# Patient Record
Sex: Male | Born: 1955 | Race: White | Hispanic: No | Marital: Single | Smoking: Current every day smoker
Health system: Southern US, Community
[De-identification: ages and names within clinical notes are randomized; demographics above are authoritative.]

## PROBLEM LIST (undated history)

## (undated) ENCOUNTER — Emergency Department (HOSPITAL_COMMUNITY): Discharge status: Absent without leave | Payer: Medicare Other

## (undated) DIAGNOSIS — G4700 Insomnia, unspecified: Secondary | ICD-10-CM

## (undated) DIAGNOSIS — J45909 Unspecified asthma, uncomplicated: Secondary | ICD-10-CM

## (undated) DIAGNOSIS — K259 Gastric ulcer, unspecified as acute or chronic, without hemorrhage or perforation: Secondary | ICD-10-CM

## (undated) DIAGNOSIS — F32A Depression, unspecified: Secondary | ICD-10-CM

## (undated) DIAGNOSIS — F200 Paranoid schizophrenia: Secondary | ICD-10-CM

## (undated) DIAGNOSIS — E039 Hypothyroidism, unspecified: Secondary | ICD-10-CM

## (undated) DIAGNOSIS — F329 Major depressive disorder, single episode, unspecified: Secondary | ICD-10-CM

## (undated) DIAGNOSIS — J449 Chronic obstructive pulmonary disease, unspecified: Secondary | ICD-10-CM

## (undated) DIAGNOSIS — N1330 Unspecified hydronephrosis: Secondary | ICD-10-CM

## (undated) DIAGNOSIS — R569 Unspecified convulsions: Secondary | ICD-10-CM

## (undated) DIAGNOSIS — F99 Mental disorder, not otherwise specified: Secondary | ICD-10-CM

## (undated) DIAGNOSIS — K219 Gastro-esophageal reflux disease without esophagitis: Secondary | ICD-10-CM

## (undated) DIAGNOSIS — K644 Residual hemorrhoidal skin tags: Secondary | ICD-10-CM

## (undated) DIAGNOSIS — I219 Acute myocardial infarction, unspecified: Secondary | ICD-10-CM

## (undated) DIAGNOSIS — I639 Cerebral infarction, unspecified: Secondary | ICD-10-CM

## (undated) DIAGNOSIS — N289 Disorder of kidney and ureter, unspecified: Secondary | ICD-10-CM

## (undated) HISTORY — PX: NEPHRECTOMY: SHX65

## (undated) HISTORY — PX: SUPRAPUBIC CATHETER INSERTION: SUR719

## (undated) HISTORY — PX: APPENDECTOMY: SHX54

---

## 2004-06-20 ENCOUNTER — Ambulatory Visit: Payer: Self-pay | Admitting: Urology

## 2005-06-03 ENCOUNTER — Other Ambulatory Visit: Payer: Self-pay

## 2005-06-03 ENCOUNTER — Emergency Department: Payer: Self-pay | Admitting: Unknown Physician Specialty

## 2005-06-25 ENCOUNTER — Inpatient Hospital Stay: Payer: Self-pay | Admitting: Urology

## 2005-06-25 ENCOUNTER — Other Ambulatory Visit: Payer: Self-pay

## 2005-07-22 ENCOUNTER — Ambulatory Visit: Payer: Self-pay | Admitting: Urology

## 2005-07-30 ENCOUNTER — Ambulatory Visit: Payer: Self-pay | Admitting: Urology

## 2005-08-12 ENCOUNTER — Ambulatory Visit: Payer: Self-pay | Admitting: Urology

## 2005-09-15 ENCOUNTER — Ambulatory Visit: Payer: Self-pay | Admitting: Urology

## 2005-10-01 ENCOUNTER — Ambulatory Visit: Payer: Self-pay | Admitting: Urology

## 2005-10-08 ENCOUNTER — Ambulatory Visit: Payer: Self-pay | Admitting: Urology

## 2005-10-15 ENCOUNTER — Ambulatory Visit: Payer: Self-pay | Admitting: Urology

## 2005-10-23 ENCOUNTER — Ambulatory Visit: Payer: Self-pay | Admitting: Urology

## 2005-11-04 ENCOUNTER — Ambulatory Visit: Payer: Self-pay | Admitting: Urology

## 2005-12-16 ENCOUNTER — Ambulatory Visit: Payer: Self-pay | Admitting: Urology

## 2006-01-09 ENCOUNTER — Ambulatory Visit: Payer: Self-pay | Admitting: Urology

## 2006-03-20 ENCOUNTER — Ambulatory Visit: Payer: Self-pay | Admitting: Urology

## 2006-03-26 ENCOUNTER — Other Ambulatory Visit: Payer: Self-pay

## 2006-03-26 ENCOUNTER — Ambulatory Visit: Payer: Self-pay | Admitting: Urology

## 2006-04-09 ENCOUNTER — Ambulatory Visit: Payer: Self-pay | Admitting: Urology

## 2006-04-09 ENCOUNTER — Other Ambulatory Visit: Payer: Self-pay

## 2006-04-17 ENCOUNTER — Ambulatory Visit: Payer: Self-pay | Admitting: Urology

## 2006-04-30 ENCOUNTER — Ambulatory Visit: Payer: Self-pay | Admitting: Urology

## 2006-06-09 ENCOUNTER — Ambulatory Visit: Payer: Self-pay | Admitting: Urology

## 2006-07-28 ENCOUNTER — Ambulatory Visit: Payer: Self-pay | Admitting: Urology

## 2006-09-11 ENCOUNTER — Ambulatory Visit: Payer: Self-pay | Admitting: Urology

## 2006-10-29 ENCOUNTER — Ambulatory Visit: Payer: Self-pay | Admitting: Urology

## 2007-01-28 ENCOUNTER — Ambulatory Visit: Payer: Self-pay | Admitting: Urology

## 2007-02-10 ENCOUNTER — Ambulatory Visit: Payer: Self-pay | Admitting: Urology

## 2007-03-04 ENCOUNTER — Ambulatory Visit: Payer: Self-pay | Admitting: Urology

## 2007-03-15 ENCOUNTER — Ambulatory Visit: Payer: Self-pay | Admitting: Urology

## 2007-04-12 ENCOUNTER — Ambulatory Visit: Payer: Self-pay | Admitting: Urology

## 2007-07-08 ENCOUNTER — Ambulatory Visit: Payer: Self-pay | Admitting: Urology

## 2007-11-18 ENCOUNTER — Ambulatory Visit: Payer: Self-pay | Admitting: Urology

## 2007-11-25 ENCOUNTER — Ambulatory Visit: Payer: Self-pay | Admitting: Urology

## 2007-12-10 ENCOUNTER — Ambulatory Visit: Payer: Self-pay | Admitting: Urology

## 2007-12-27 IMAGING — CR DG CHEST 1V PORT
1 series · 1 of 1 positions shown · non-contrast
Comparison: none

REASON FOR EXAM: Shortness of breath
COMMENTS:

PROCEDURE:     DXR - DXR PORTABLE CHEST SINGLE VIEW  - June 03, 2005  [DATE]
RESULT:     AP view of the chest shows the lung fields to be clear. No acute
changes of the heart, mediastinal or osseous structures are seen. Monitoring
electrodes are present.

[view not recorded]
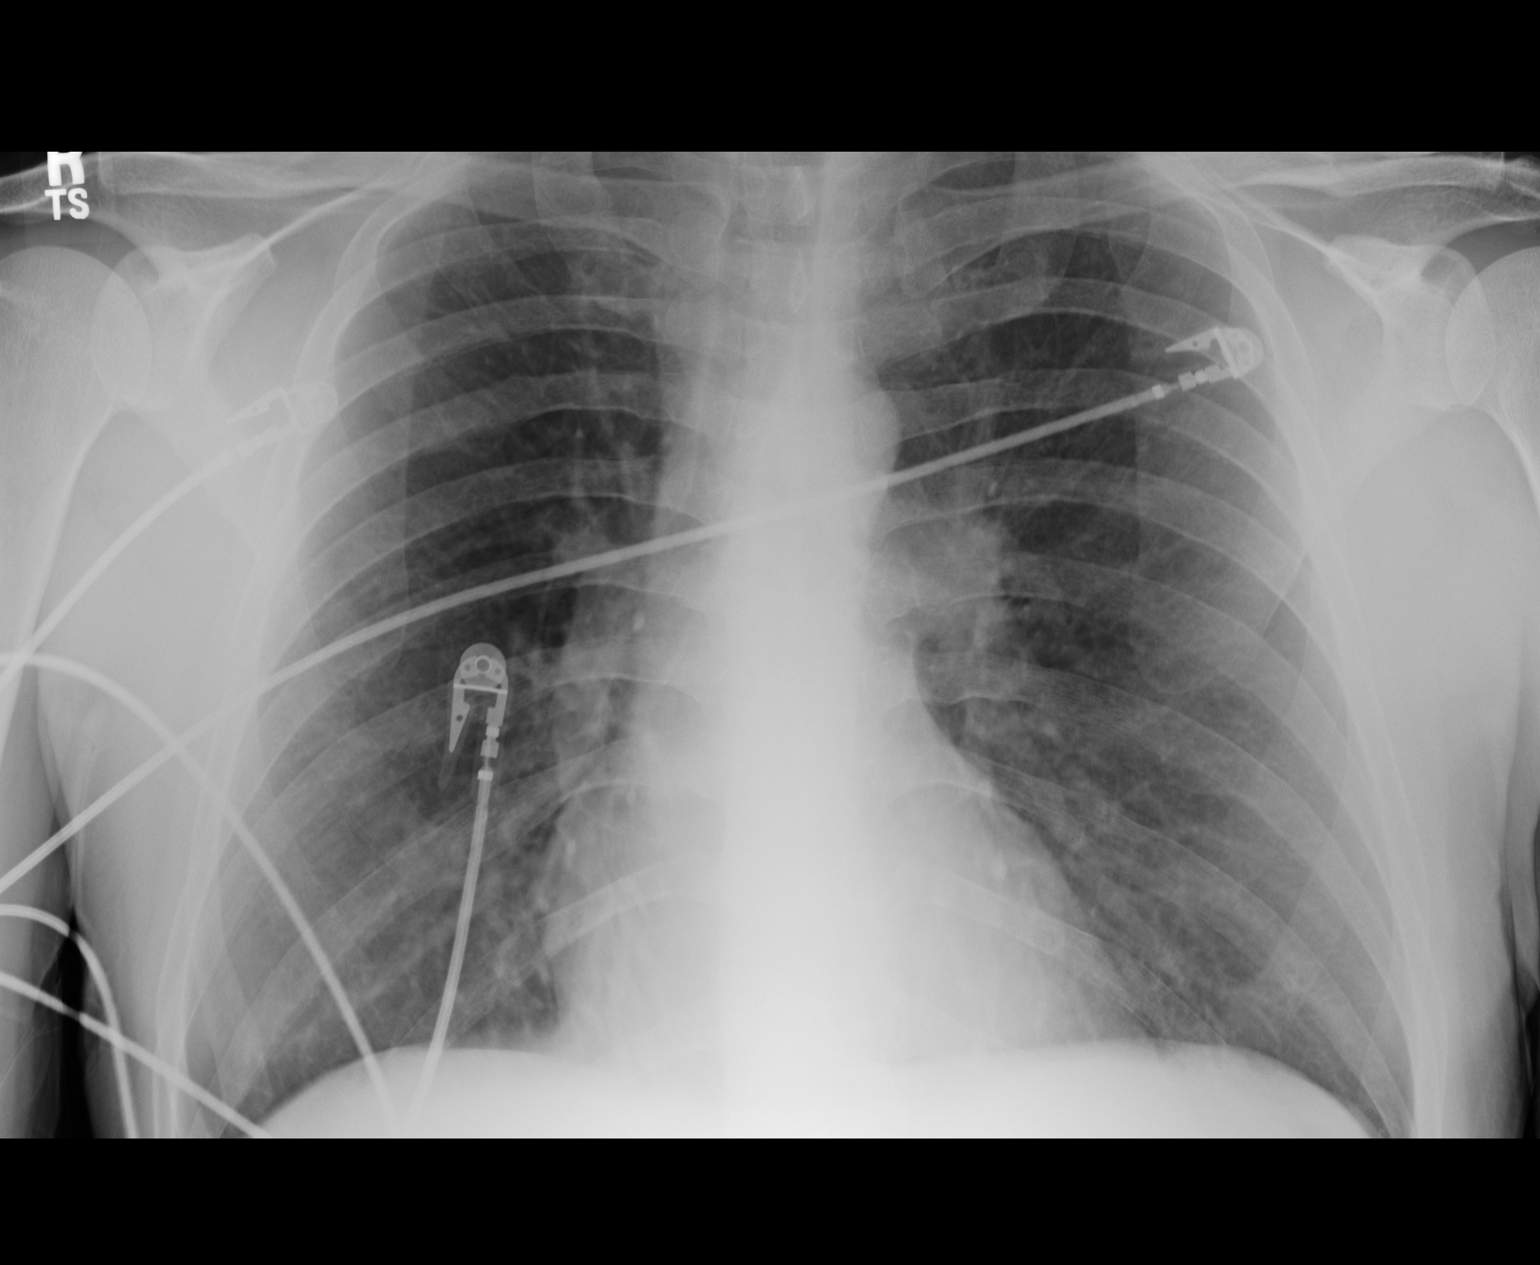

[1 of 1 positions shown; findings below may reference images not displayed]

IMPRESSION: No acute changes are identified.

## 2008-01-13 ENCOUNTER — Ambulatory Visit: Payer: Self-pay | Admitting: Urology

## 2008-01-18 IMAGING — CT CT ABD-PELV W/O CM
1 of 3 series · 13 of 32 positions shown, 19 images · non-contrast
Comparison: none

REASON FOR EXAM: (1) Abdominal pain/dark urine rm 7 (2) Abdominal pain
/dark urine rm 7
COMMENTS:

[Series 2: stone · axial · 0.79mm/px · z∈[-39,+345]mm · 13 of 148 slices shown, 19 images]
[im 10/148  soft-tissue]
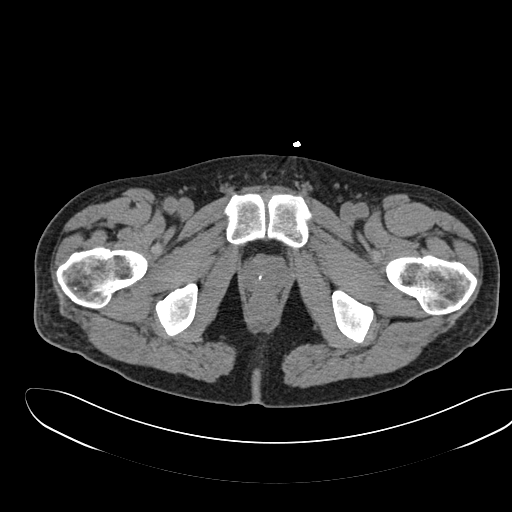
[im 10/148  bone]
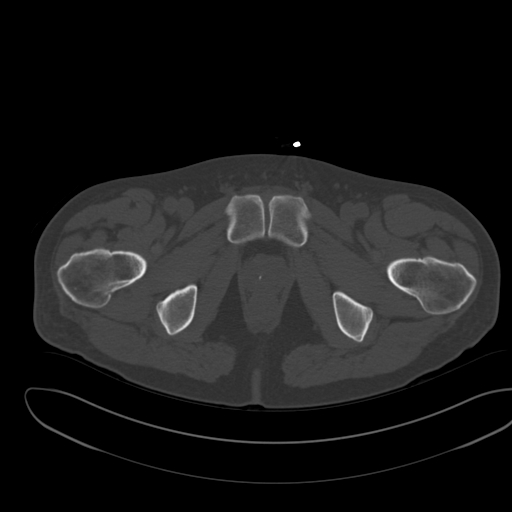
[im 20/148  soft-tissue]
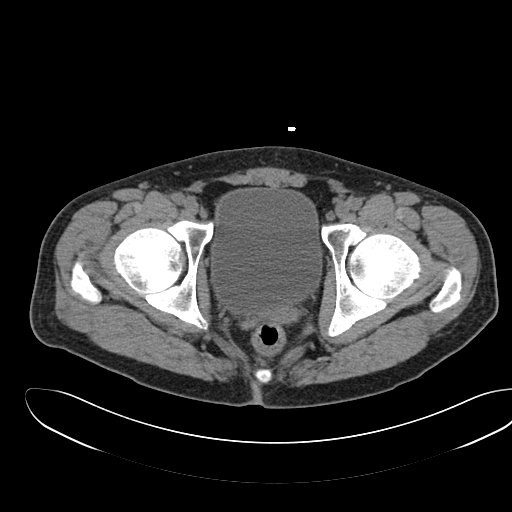
[im 30/148  soft-tissue]
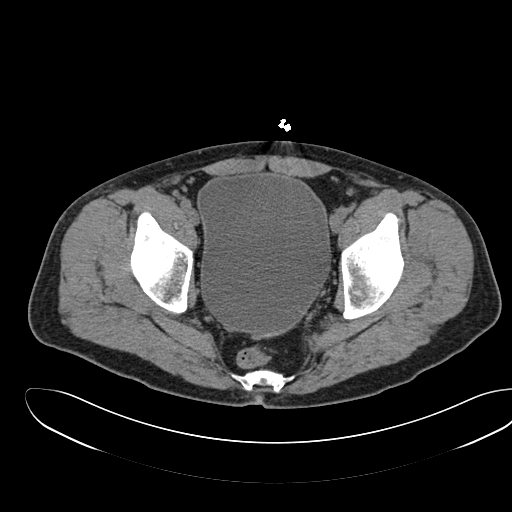
[im 40/148  soft-tissue]
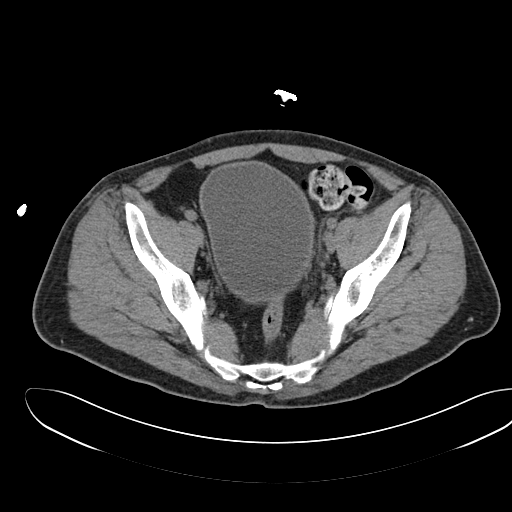
[im 50/148  soft-tissue]
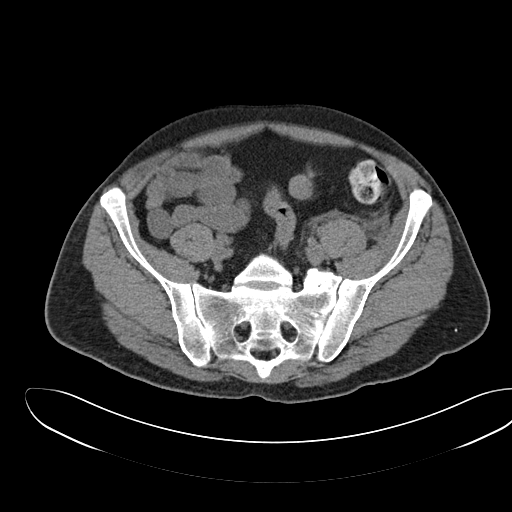
[im 59/148  soft-tissue]
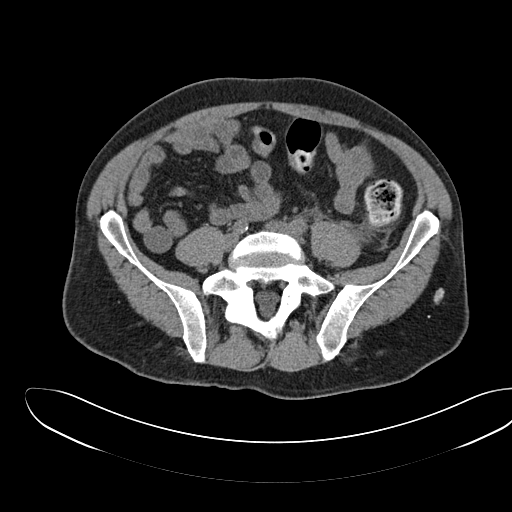
[im 79/148  soft-tissue]
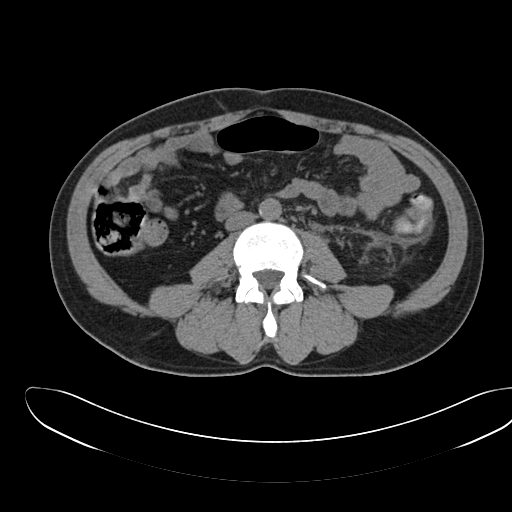
[im 89/148  soft-tissue]
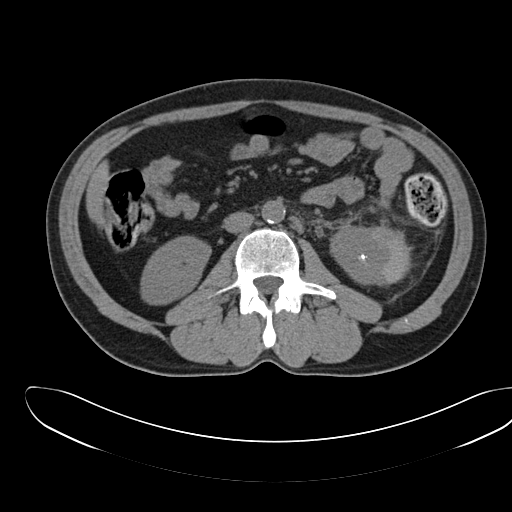
[im 99/148  soft-tissue]
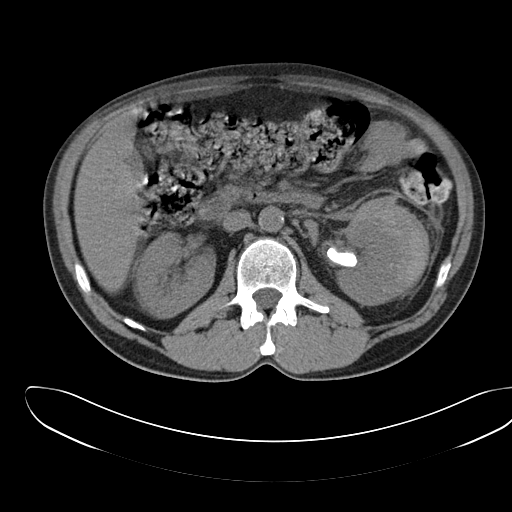
[im 99/148  bone]
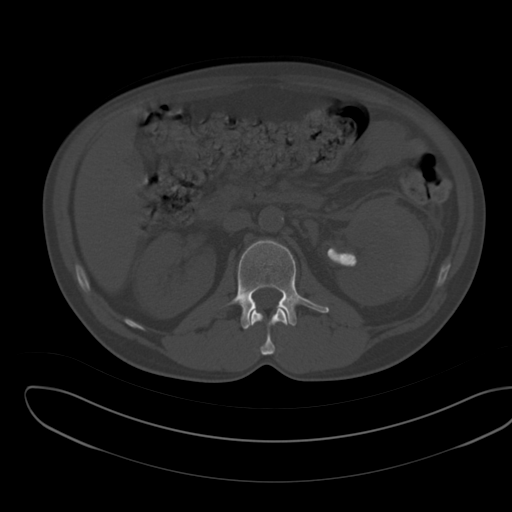
[im 108/148  soft-tissue]
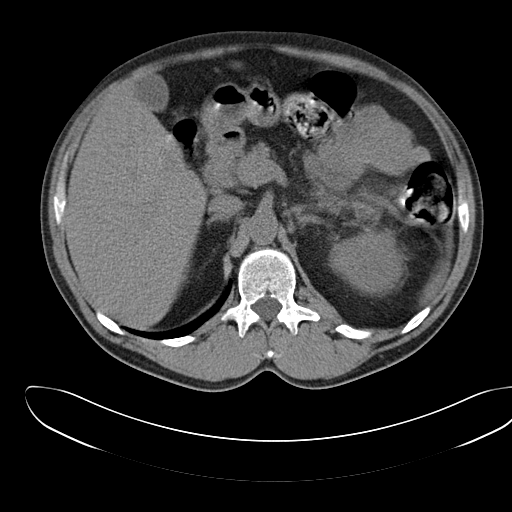
[im 108/148  lung]
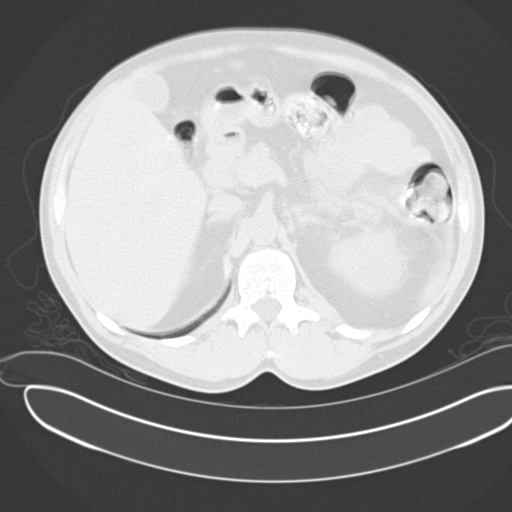
[im 118/148  soft-tissue]
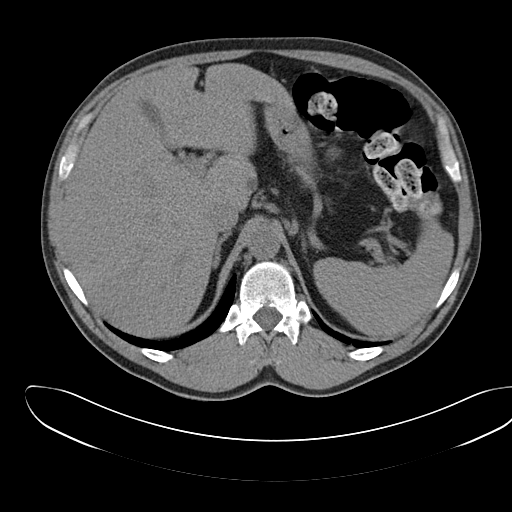
[im 118/148  lung]
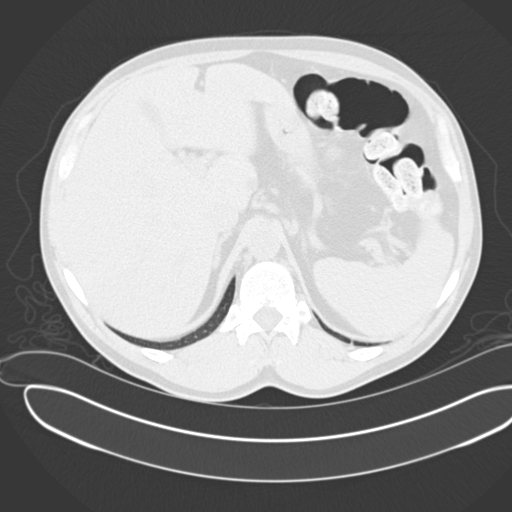
[im 128/148  soft-tissue]
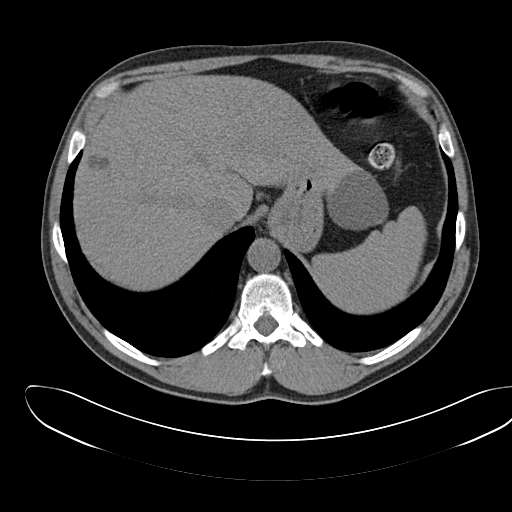
[im 128/148  lung]
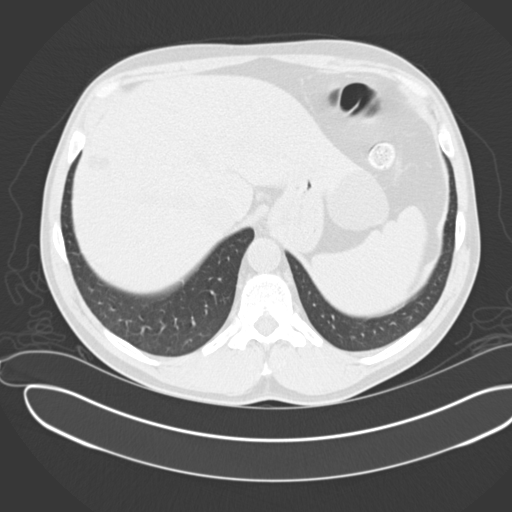
[im 138/148  soft-tissue]
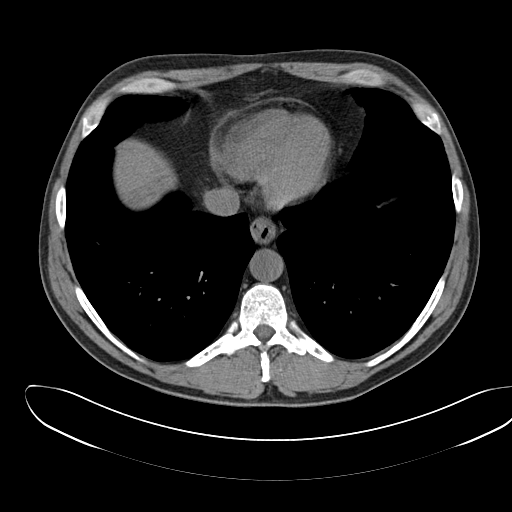
[im 138/148  lung]
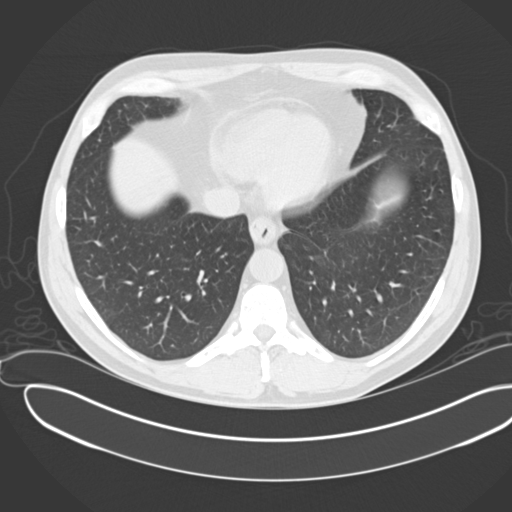

[13 of 32 positions shown; findings below may reference images not displayed]

PROCEDURE:     CT  - CT ABDOMEN AND PELVIS W[DATE] [DATE]

RESULT:       Emergent unenhanced CT scan was performed for abdominal pain
and hematuria.  Exam was originally read by the [HOSPITAL].

There is a large calculus measuring 2.5 cm in the renal pelvis as well as
smaller calculi measuring approximately 6 mm in the lower pole, other
measures 9 mm and 2 mm.  There is noted a subcapsular hematoma measuring
approximately 10.5 cm thick and there appear calculi measuring approximately
8 mm in the posterior LEFT kidney as well as small in the subcutaneous
tissue and retroperitoneal fat.  Has the patient had recent procedure on the
LEFT kidney?  There is thickening of Gerota's fascia with some fluid
extending down into the pelvis.  There are noted two calculi in the lower
pole of the RIGHT kidney.  One approximately 2 mm and one approximately 6
mm.  No ureteral calculi are noted.  No hydronephrosis is noted.  The lung
bases appear clear.
IMPRESSION: 1.     Subcapsular hematoma in the LEFT kidney with some fluid in Gerota's
fascia extending down to the LEFT pelvis.  There is noted a staghorn
calculus in the LEFT kidney.  Largest in the renal pelvis.  There appears
several more calculi in the lower pole as well as in the retroperitoneal fat
and the subcutaneous tissue.  It appears that the patient has had recent
procedure on the LEFT kidney.
2.     Two calculi are noted in the lower pole of the RIGHT kidney which is
nonobstructive.

## 2008-01-19 IMAGING — CR DG ABDOMEN 1V
1 series · 1 of 1 positions shown · non-contrast
Comparison: none

REASON FOR EXAM: PYELONEPHRITIS
COMMENTS:

[view not recorded]
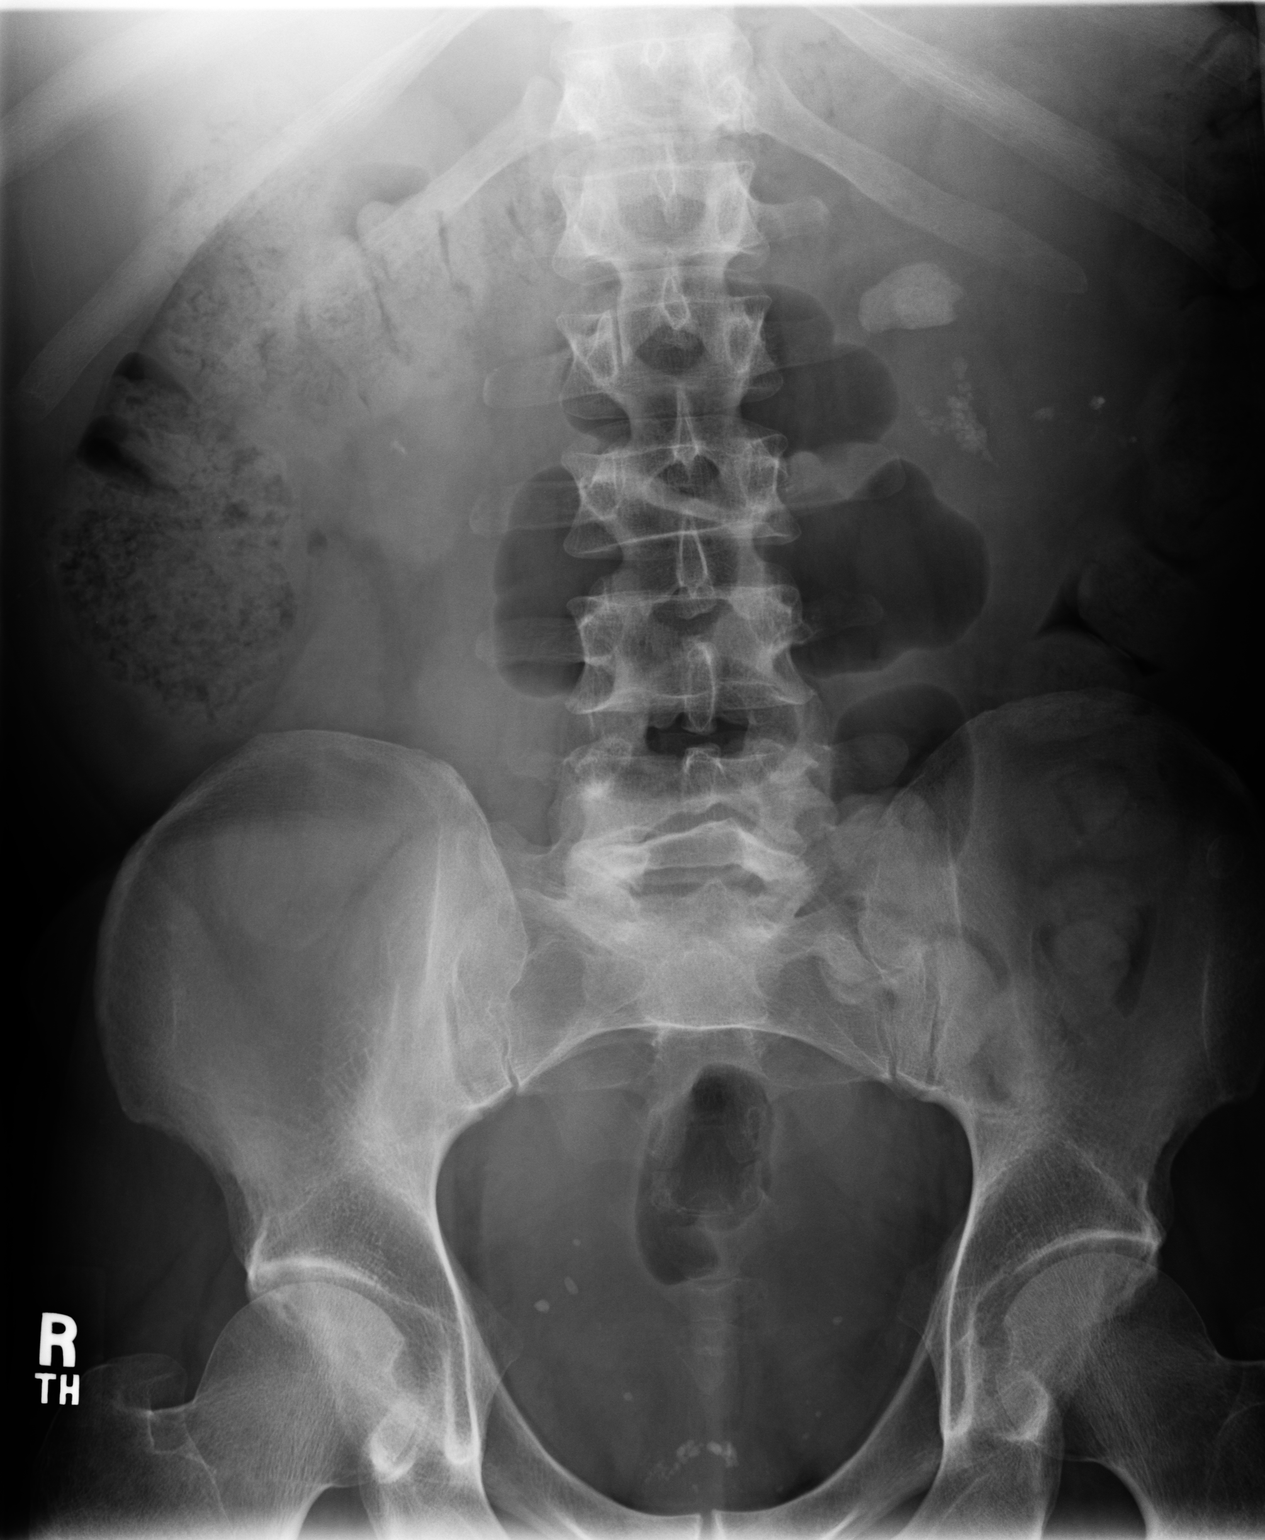

[1 of 1 positions shown; findings below may reference images not displayed]

PROCEDURE:     DXR - DXR KIDNEY URETER BLADDER  - June 26, 2005  [DATE]

RESULT:     There is a 2.8 cm x 1.8 cm calcification projected over the
midpole region of the LEFT kidney.  Multiple smaller calcifications are
present more inferiorly.  The RIGHT kidney is in great part obscured by
overlying bowel and bowel content.  There is a single tiny 2 to 3 mm density
projected over the lower pole of the RIGHT kidney which could represent a
tiny RIGHT caliceal stone or could be external to the kidney.  Calcification
is present in the region of the prostate gland.  There are a few small
calcifications observed in the pelvis.  The nature of these on plain film
exam is indeterminate.  Phleboliths would be the first consideration but the
possibility of a distal ureteral stone on either side cannot be totally
excluded on the bases of this study.  The bowel gas pattern is normal.  No
acute bony abnormalities are seen.
IMPRESSION: Please see above.

## 2008-01-28 ENCOUNTER — Ambulatory Visit: Payer: Self-pay | Admitting: Urology

## 2008-02-26 ENCOUNTER — Inpatient Hospital Stay: Payer: Self-pay | Admitting: Psychiatry

## 2008-03-06 IMAGING — CT CT ABDOMEN W/O CM
1 of 2 series · 14 of 32 positions shown, 18 images · non-contrast
Comparison: none

REASON FOR EXAM: LEFT renal calculus,  LEFT subcapsular hemorrhage
COMMENTS:

[Series 2: soft tissue · axial · 0.76mm/px · z∈[-643,-418]mm · 14 of 86 slices shown, 18 images]
[im 7/86  soft-tissue]
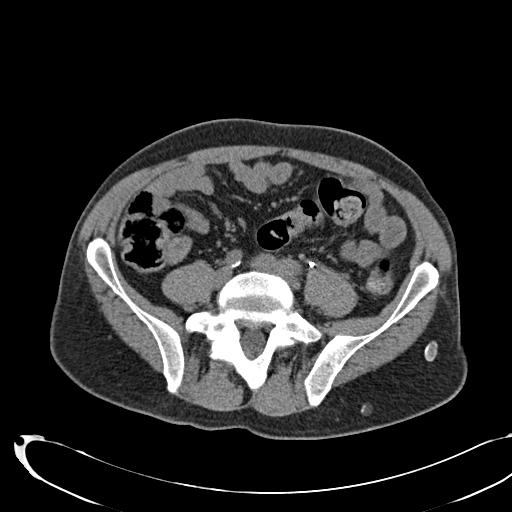
[im 7/86  bone]
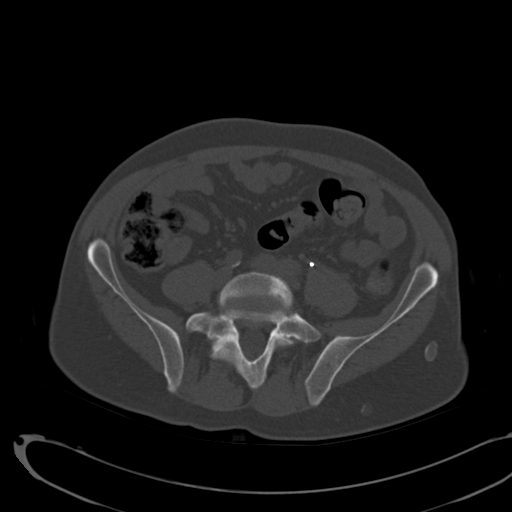
[im 13/86  soft-tissue]
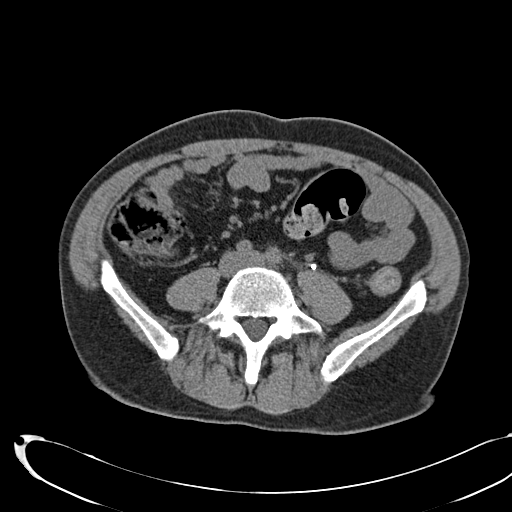
[im 19/86  soft-tissue]
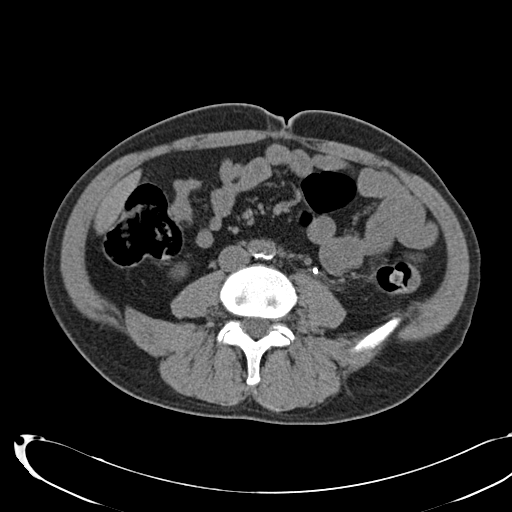
[im 26/86  soft-tissue]
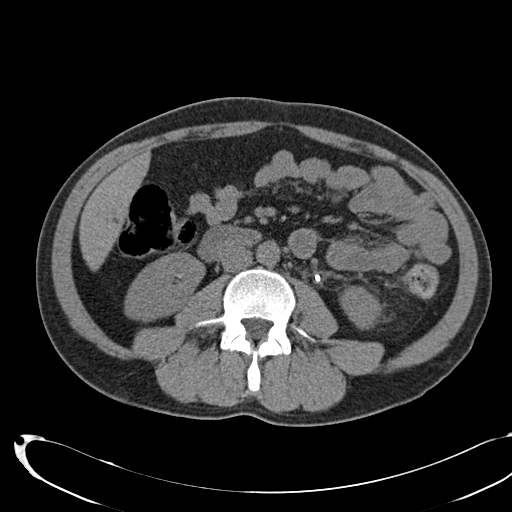
[im 32/86  soft-tissue]
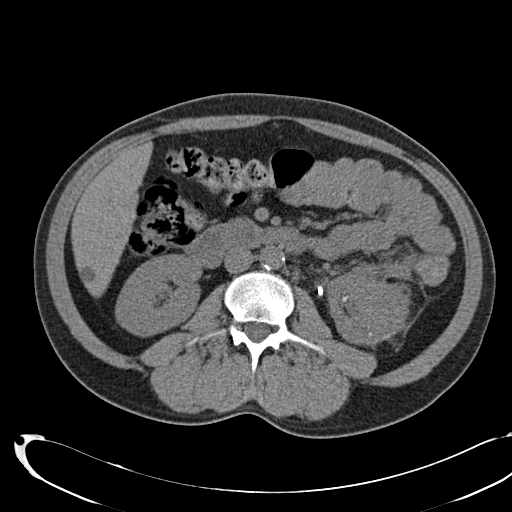
[im 38/86  soft-tissue]
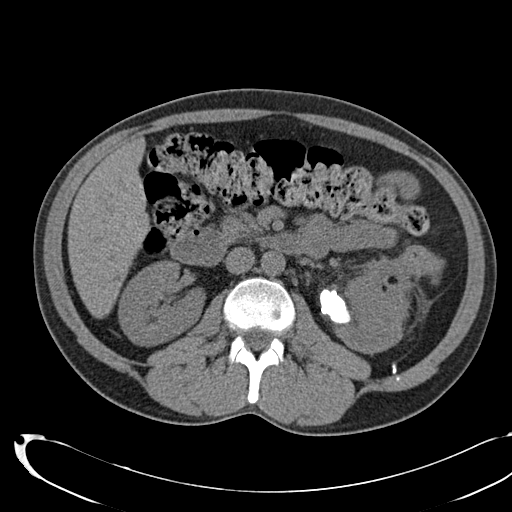
[im 48/86  soft-tissue]
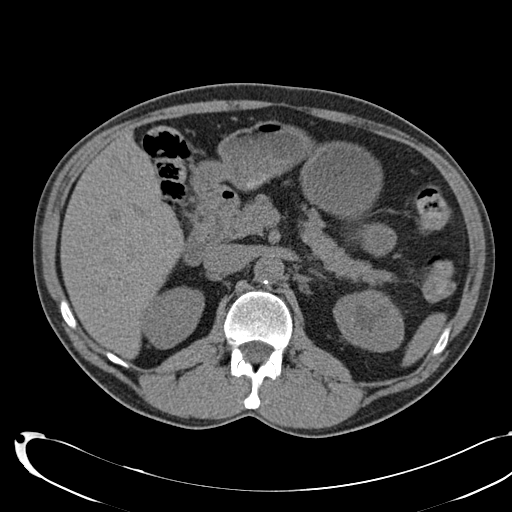
[im 54/86  soft-tissue]
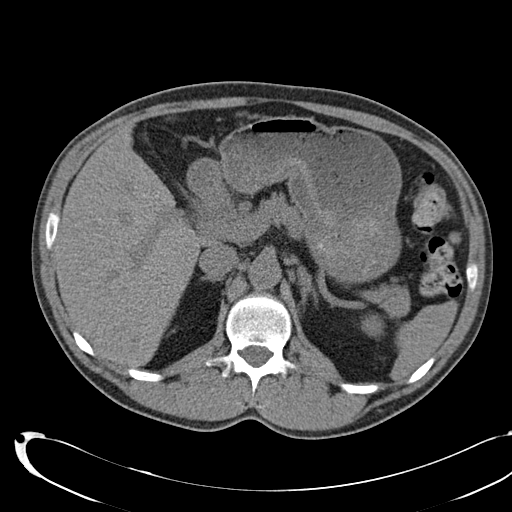
[im 60/86  soft-tissue]
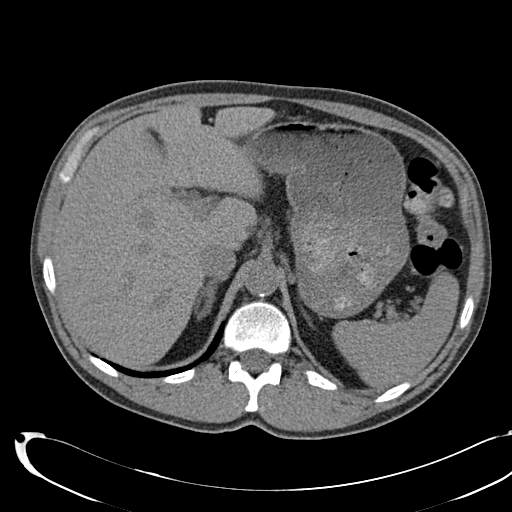
[im 60/86  bone]
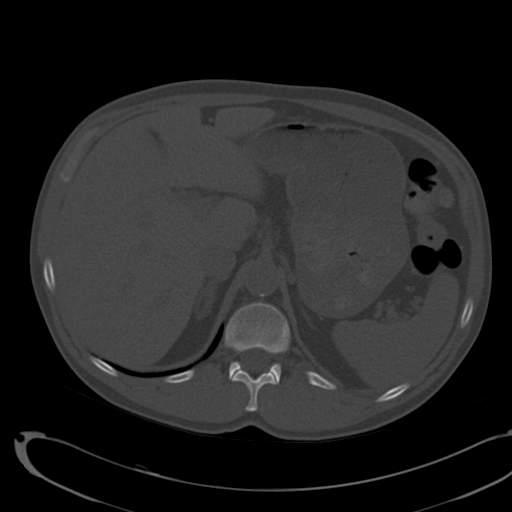
[im 67/86  soft-tissue]
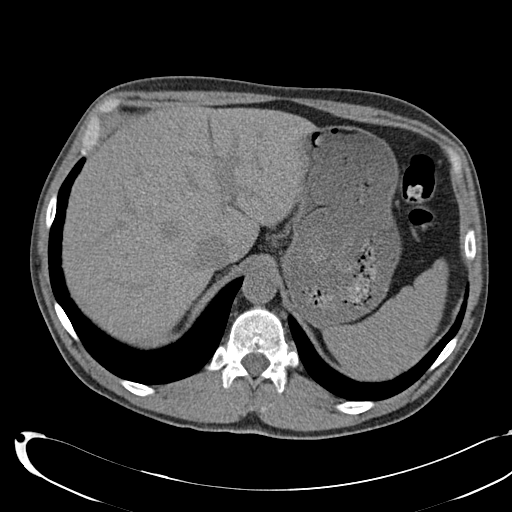
[im 73/86  soft-tissue]
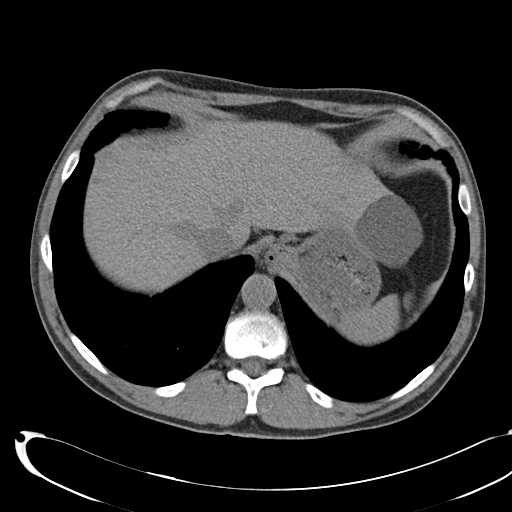
[im 73/86  lung]
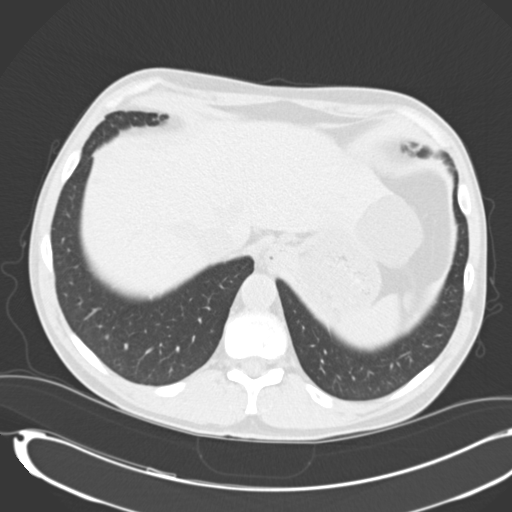
[im 76/86  lung]
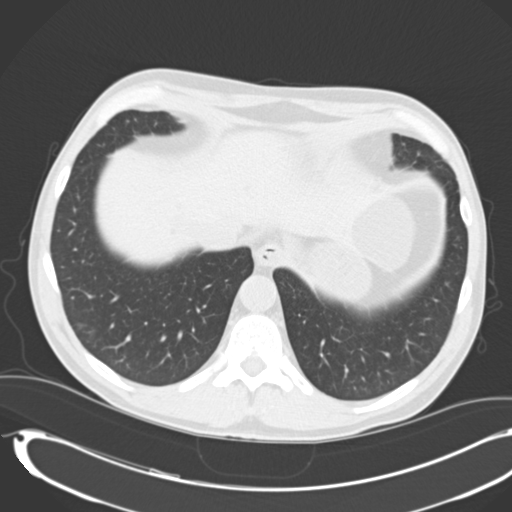
[im 79/86  soft-tissue]
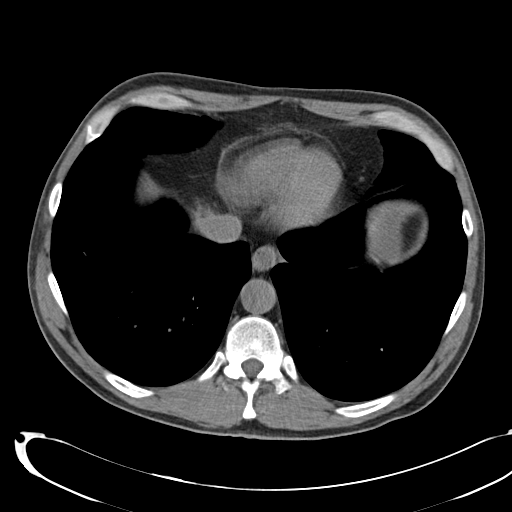
[im 79/86  lung]
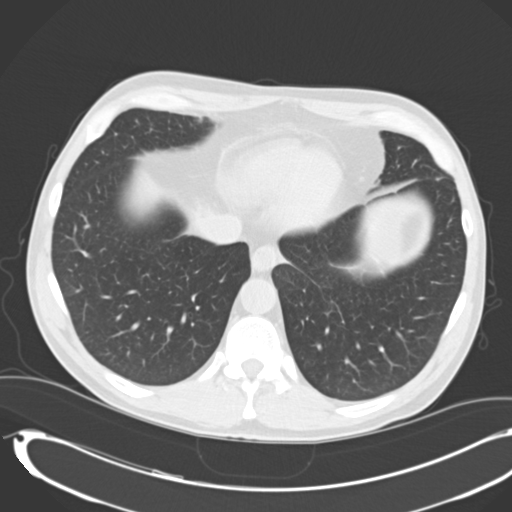
[im 82/86  lung]
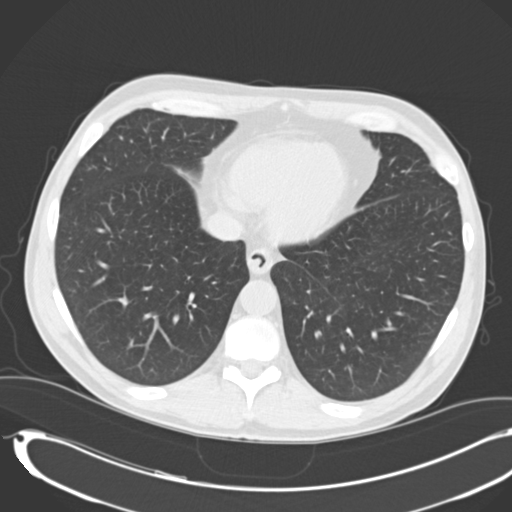

[14 of 32 positions shown; findings below may reference images not displayed]

PROCEDURE:     CT  - CT ABDOMEN STANDARD WO  - August 12, 2005  [DATE]

RESULT:          Comparison is made to a study 25 June, 2005.  The
patient has a history of a LEFT renal stone and subcapsular hemorrhage on
the LEFT.

Since the prior study, the abnormal soft tissue density surrounding the LEFT
kidney has improved somewhat and is certainly less dense.  There remain
coarse calcifications projecting in the renal pelvis as well as in the lower
pole of the LEFT kidney.  There is a cortically-based hypodensity in the
LEFT kidney in the upper pole medially which is most compatible with a cyst,
with Hounsfield measurements of 12.  The dominant staghorn calculus measures
approximately 3 cm in oblique AP dimension by approximately 1.8 cm in
oblique transverse dimension by approximately 2.4 cm in superior to inferior
dimension.  A double-J ureteral stent is in place.  On the RIGHT, there are
nonobstructing stones in a lower pole calix.  The largest of these measures
approximately 2 x 5 mm.  The second measures approximately 2 x 1 mm in
diameter.

The liver exhibits a stable hypodensity in the RIGHT lobe laterally
consistent with a lobulated cystic structure or two closely applied cysts.
There is no intrahepatic ductal dilation.  In the extreme LEFT lobe of the
liver, there is an approximately 5.6 cm diameter hypodense structure with
Hounsfield measurements of 7, consistent with a stable hepatic cyst.

The stomach is distended with food.  The spleen is not enlarged.  The
adrenals are normal in size.  The pancreas exhibits no focal mass or
evidence of ductal dilation.  The gallbladder appears contracted.  The
caliber of the abdominal aorta is normal.  There is no free fluid noted in
the abdomen.  The unopacified loops of small and large bowel are normal in
appearance.  The lung bases are clear.
IMPRESSION: 1.     There is a staghorn calculus on the LEFT with measurements as given
above.  A double-J ureteral stent is in place, and I do not see significant
hydronephrosis on the LEFT.  There are additional smaller calcified stones
in the lower pole of the LEFT kidney.
2.     The subcapsular hemorrhage on the LEFT has become more organized, and
the perinephric fat is more normal in density; but the anterolateral margins
of the kidney remain somewhat blurred.  No fresh hyperdense blood is seen.
3.     The stomach is moderately distended with food, presumably related to
a recent meal.  If there has been no recent ingestion of food, the
possibility of gastroparesis or gastric outlet obstruction may be
entertained.
4.     There are cystic changes in the liver which appear stable.  The
gallbladder is contracted.
5.     There are nonobstructing lower pole stones in the RIGHT kidney.

## 2008-04-11 ENCOUNTER — Ambulatory Visit: Payer: Self-pay | Admitting: Urology

## 2008-05-09 IMAGING — CR DG ABDOMEN 1V
1 series · 1 of 1 positions shown · non-contrast
Comparison: none

REASON FOR EXAM: NEPHROLITHIASIS
            PATIENT NEED FILMS
COMMENTS:

[view not recorded]
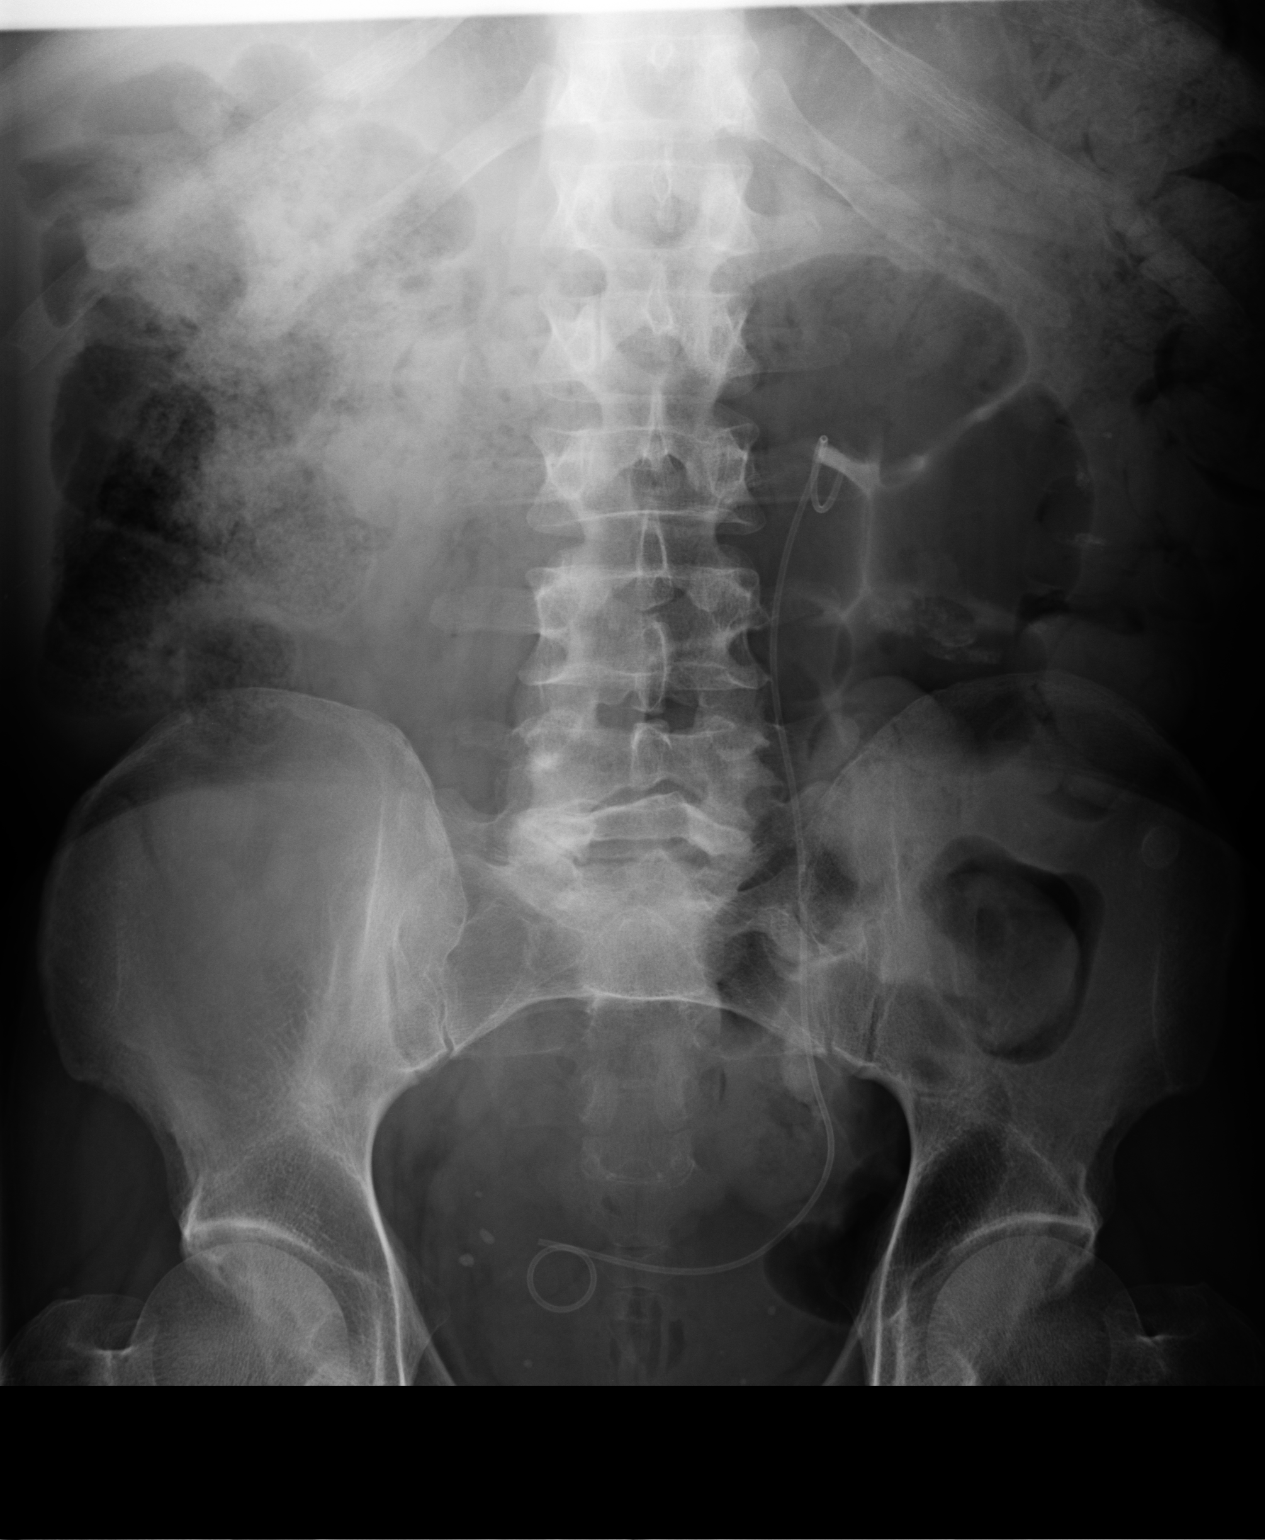

[1 of 1 positions shown; findings below may reference images not displayed]

PROCEDURE:     DXR - DXR KIDNEY URETER BLADDER  - October 15, 2005  [DATE]

RESULT:     A double-J ureteral stent is appreciated involving the LEFT
collecting system proximal portion projecting in the region of the renal
pelvis distally in the region of the urinary bladder. The large, coarse,
dystrophic calcification projecting in the mid to upper pole region of the
LEFT kidney is not identified on the present study. The punctate area of
rounded, diffuse calcifications projecting inferiorly and appreciated on the
previous study are once again identified. These appear to have decreased in
bulk. No calcified densities project along the course of the ureteral stent.
Phleboliths are demonstrated within the pelvis. Air is seen within
nondilated loops of large and small bowel. A large amount of stool is
appreciated within the colon. The visualized bony skeleton demonstrates
degenerative changes involving L5-S1.
IMPRESSION: 1.     Non-obstructive bowel gas pattern with a large amount of stool.
2.     Interval resolution of the large staghorn calcification within the
mid to upper pole region of the LEFT kidney.
3.     Residual, small punctate calcifications are identified within the mid
to lower pole region.

## 2008-05-29 IMAGING — CR DG ABDOMEN 1V
1 series · 2 of 2 positions shown · non-contrast
Comparison: none

REASON FOR EXAM: XRAY KUB  NEPHROLITHIASIS "PT NEED FILMS"
COMMENTS:

[Series 1: view not recorded · 0.17mm/px · 2 of 2 slices shown]
[im 1/2]
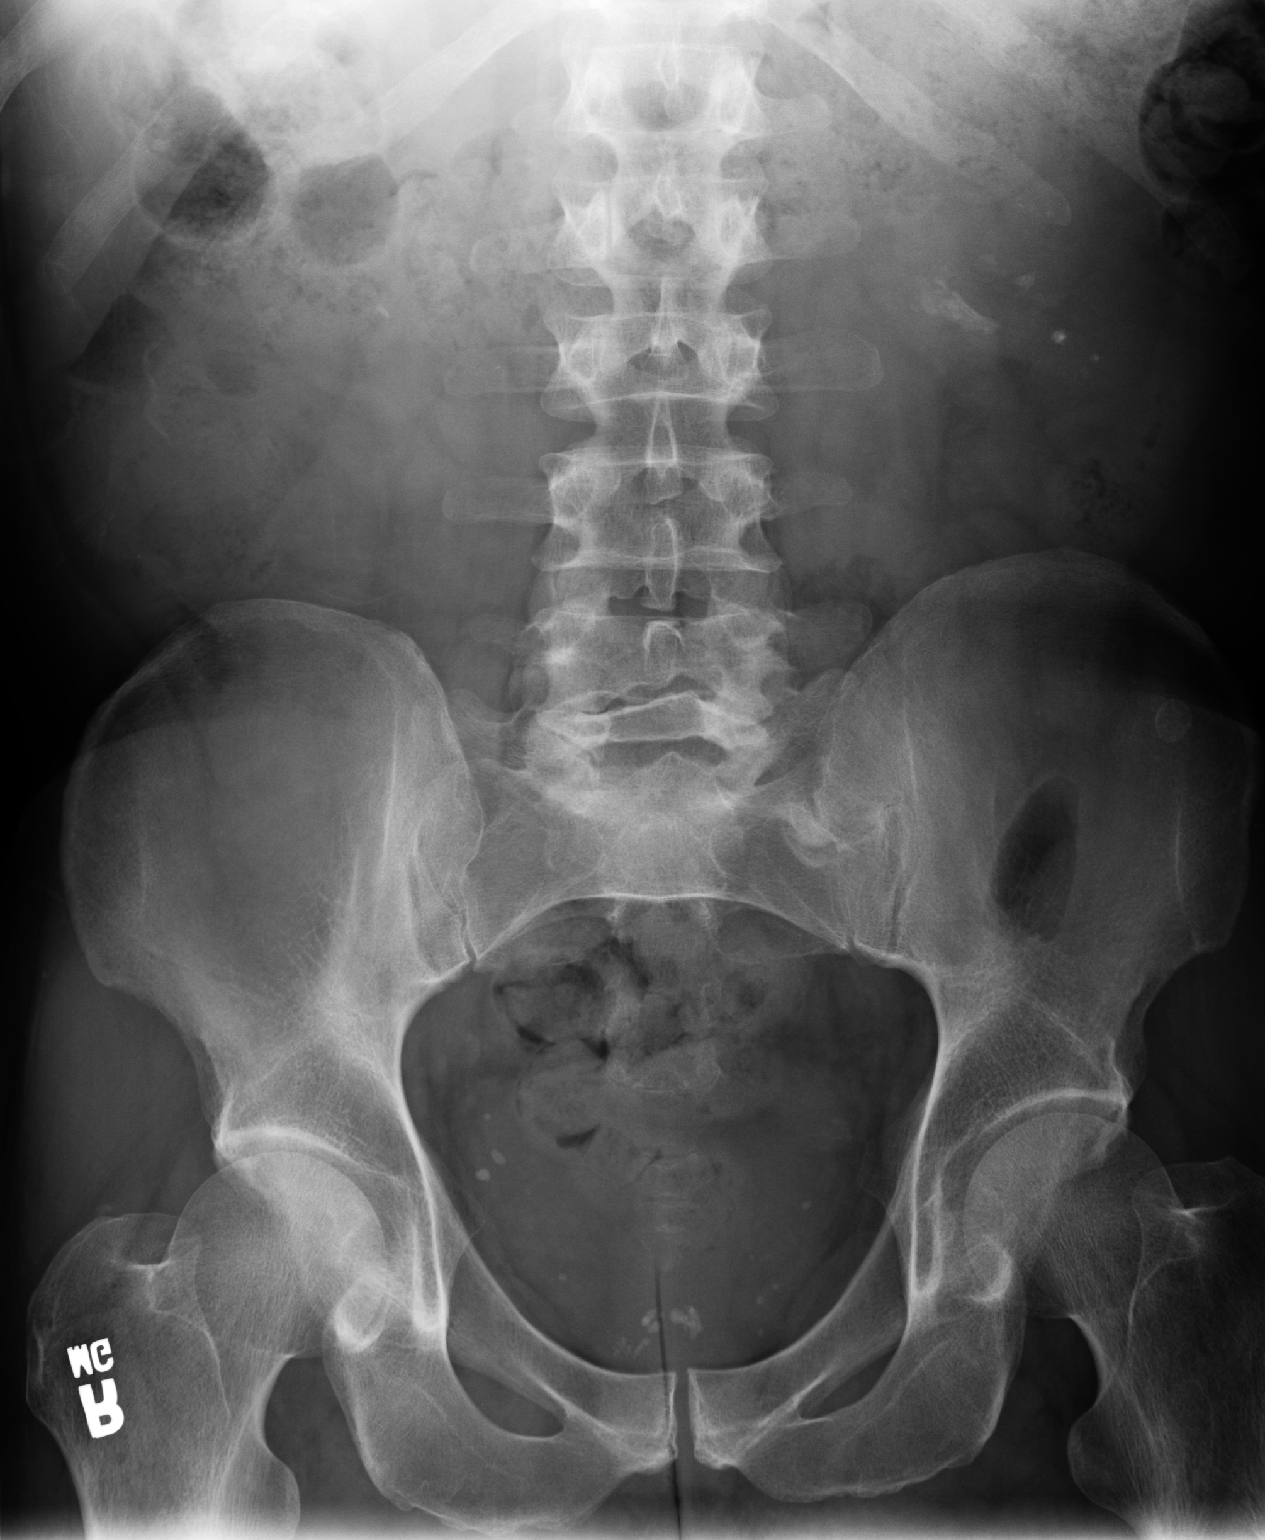
[im 2/2]
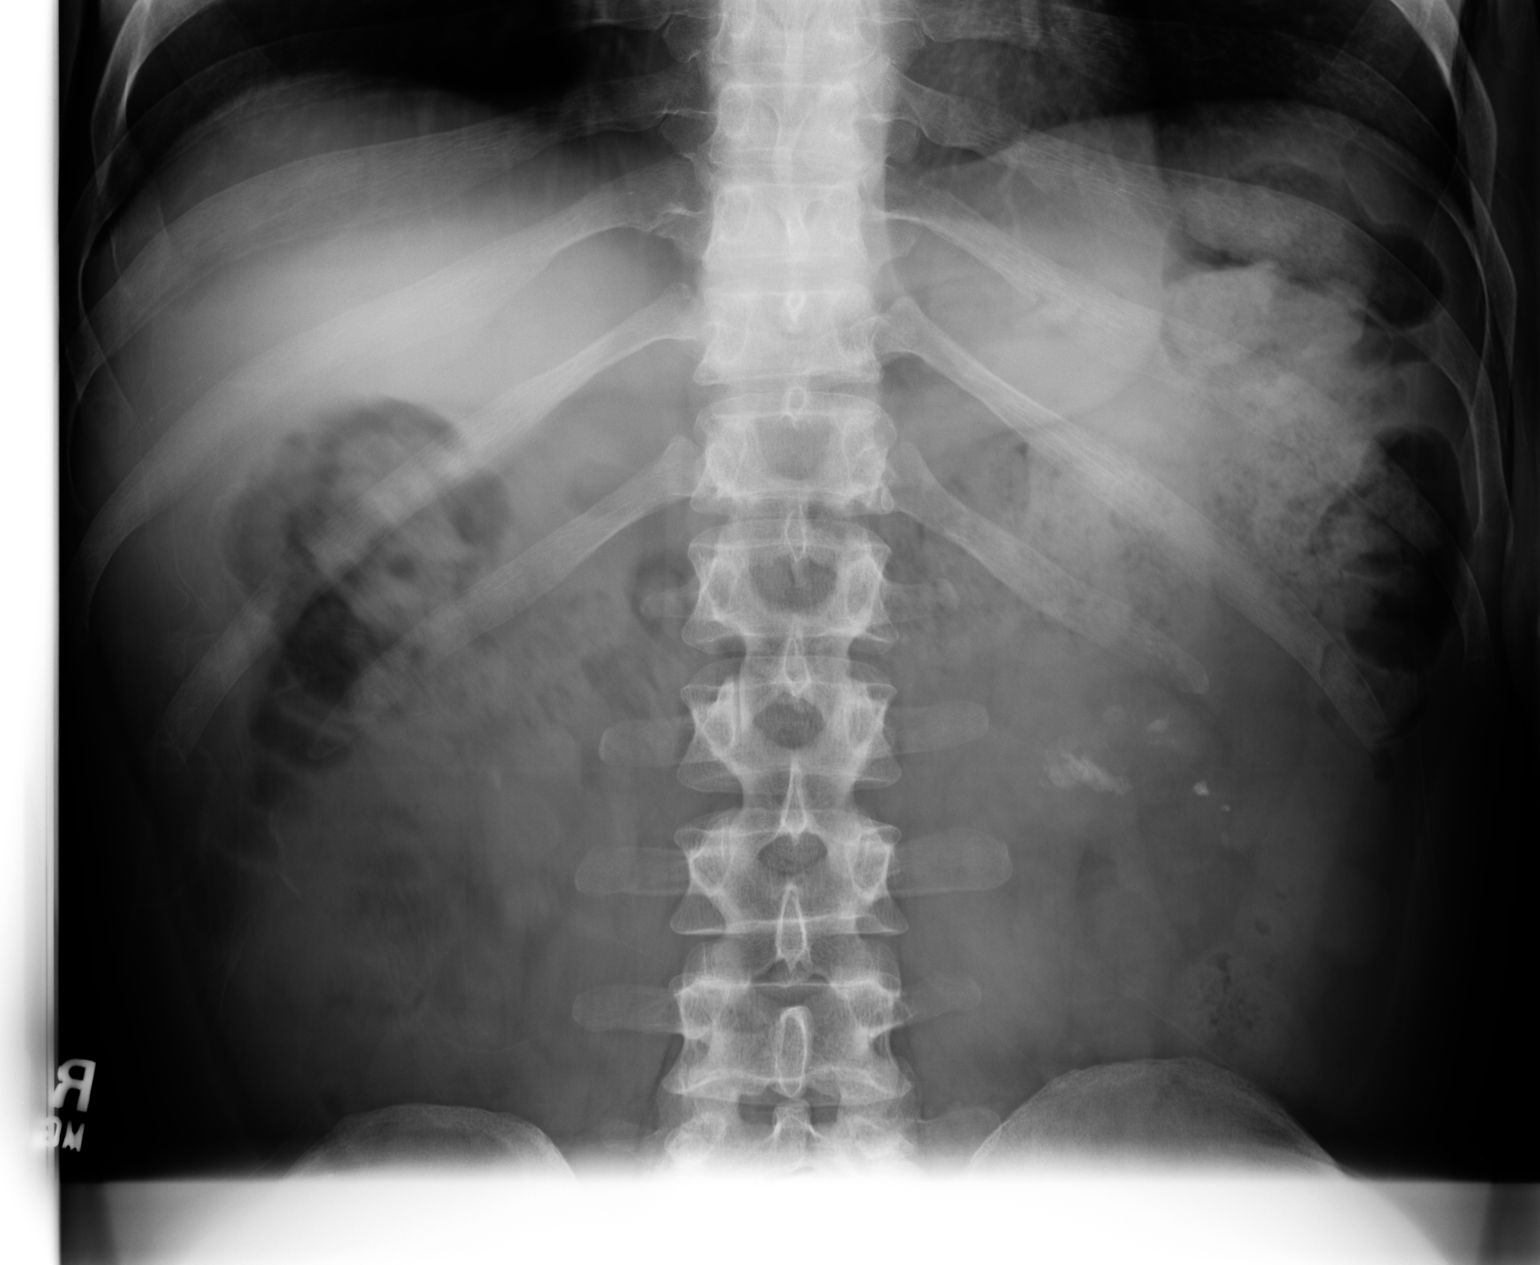

[2 of 2 positions shown; findings below may reference images not displayed]

PROCEDURE:     DXR - DXR KIDNEY URETER BLADDER  - November 04, 2005  [DATE]

RESULT:     The patient has a history of kidney stones.  Comparison is made
to the study of 10/23/05.

The double-J ureteral stent has been removed.  There remains a coarse
calcification over the region of the LEFT midpole collecting system
measuring approximately 2.3 cm in greatest dimension.  There are at least
four to five 4 mm diameter or less calcifications just lateral to this.  On
the RIGHT I do not see evidence of upper pole or midpole calcifications but
there is an approximately 4 mm diameter calcification projecting over lower
pole calix.  There are calcifications in the RIGHT aspect of the true pelvis
as well as on the LEFT that may reflect phleboliths.  There is prostatic
calcification as well.
IMPRESSION: There are numerous calcifications on the LEFT in the renal collecting
system.  There is likely a lower pole calcification on the RIGHT.

## 2008-07-10 ENCOUNTER — Ambulatory Visit: Payer: Self-pay | Admitting: Urology

## 2008-07-10 IMAGING — CR DG ABDOMEN 1V
1 series · 1 of 1 positions shown · non-contrast
Comparison: none

REASON FOR EXAM: Nephrolithiasis. Patient need film
COMMENTS:

[view not recorded]
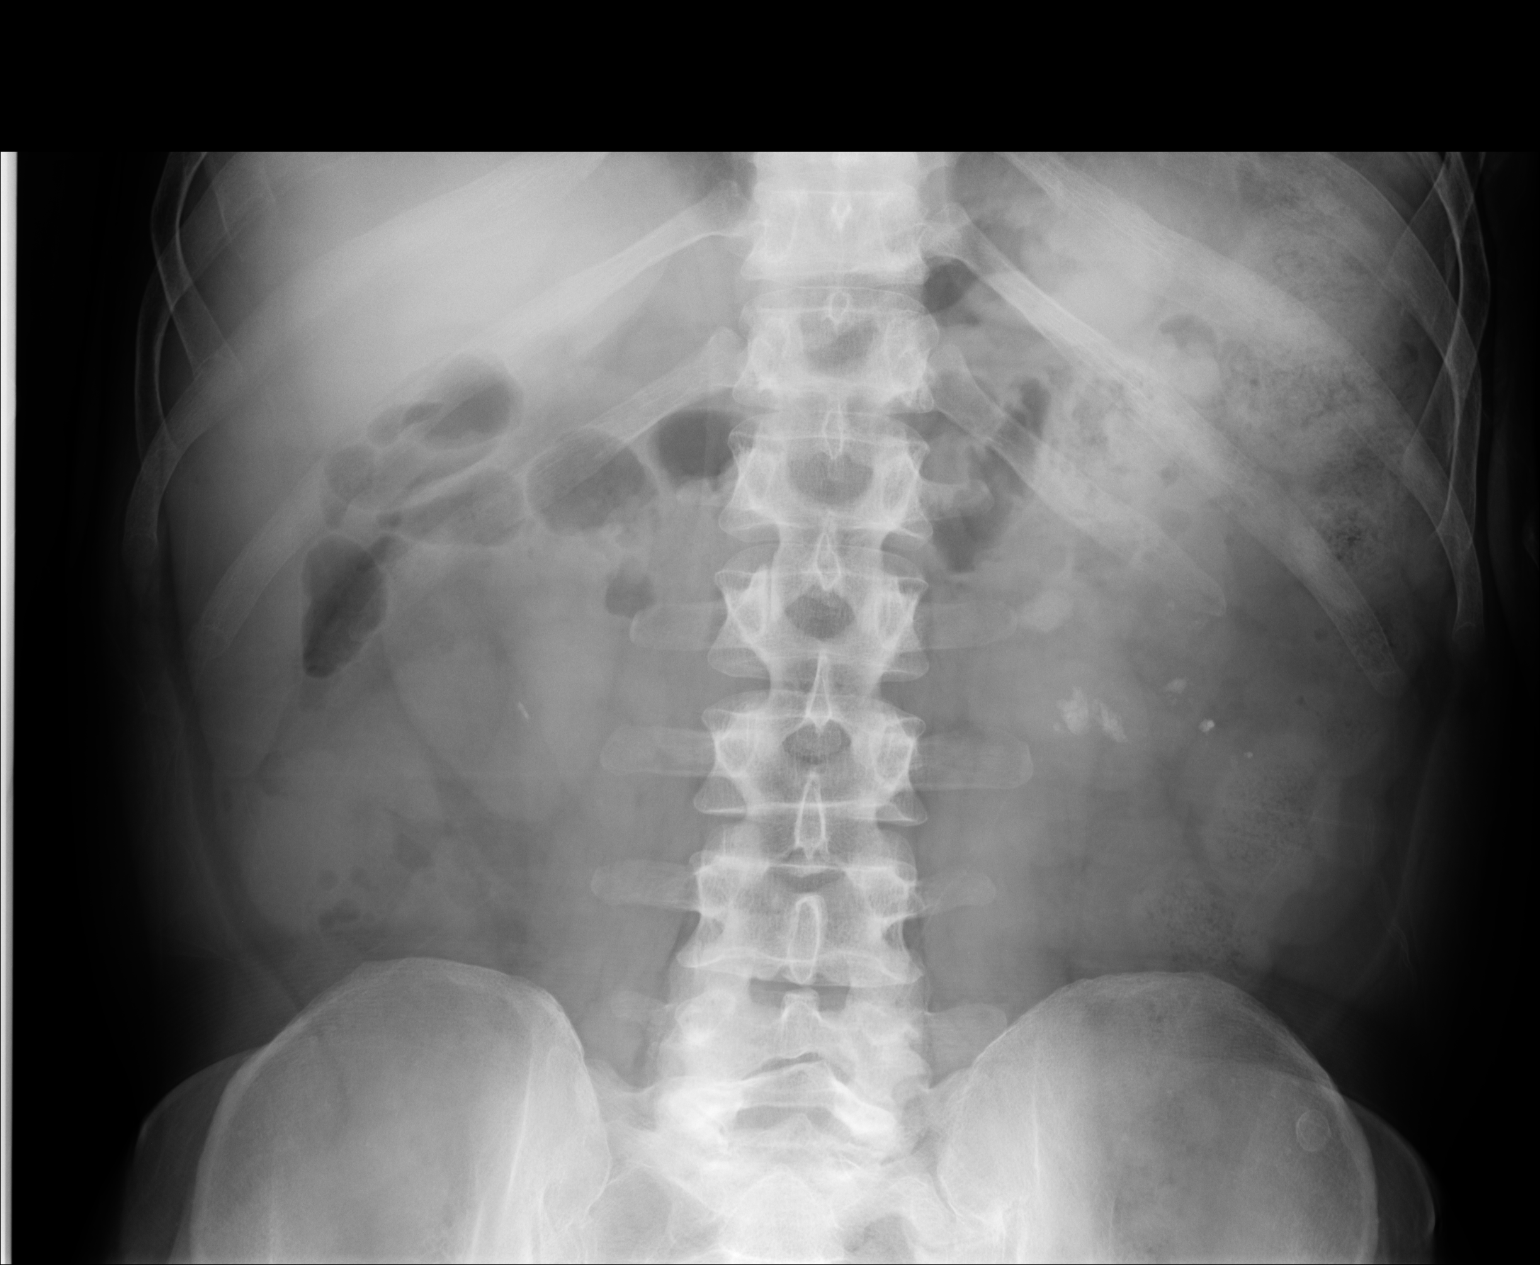

[1 of 1 positions shown; findings below may reference images not displayed]

PROCEDURE:     DXR - DXR KIDNEY URETER BLADDER  - December 16, 2005 [DATE]

RESULT:     Images demonstrate multiple calcific densities over the LEFT
kidney and renal pelvic region.  There is a surgical clip present over the
RIGHT abdomen.  No definite radiopaque RIGHT renal stones are seen.  The
exam does not include an image of the bladder.  In comparison of the study
of 11/04/05 there does not appear to be significant change in the stones over
the LEFT kidney. An additional view of the bladder can be obtained at no
additional charge if desired. Apparently this was not included initially.
IMPRESSION: Please see above.

## 2008-07-31 ENCOUNTER — Ambulatory Visit: Payer: Self-pay | Admitting: Urology

## 2008-08-03 IMAGING — CT CT ABD-PELV W/O CM
1 of 2 series · 15 of 32 positions shown, 19 images · non-contrast
Comparison: none

REASON FOR EXAM: left nephrolithiasis
COMMENTS:

PROCEDURE:     CT  - CT ABDOMEN AND PELVIS W[DATE]  [DATE]
RESULT:
HISTORY: LEFT nephrolithiasis.

[Series 2: soft tissue · axial · 0.73mm/px · z∈[-574,-178]mm · 15 of 149 slices shown, 19 images]
[im 11/149  soft-tissue]
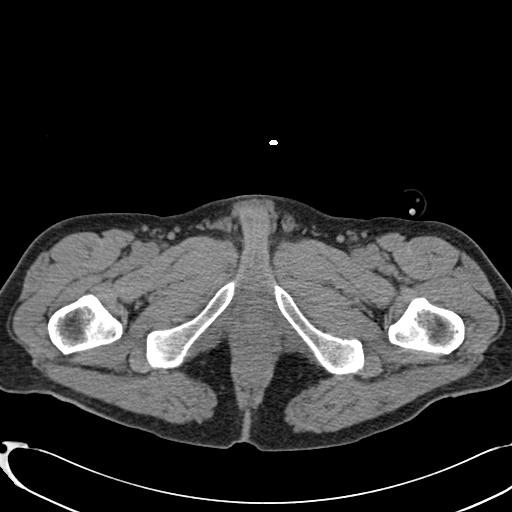
[im 11/149  bone]
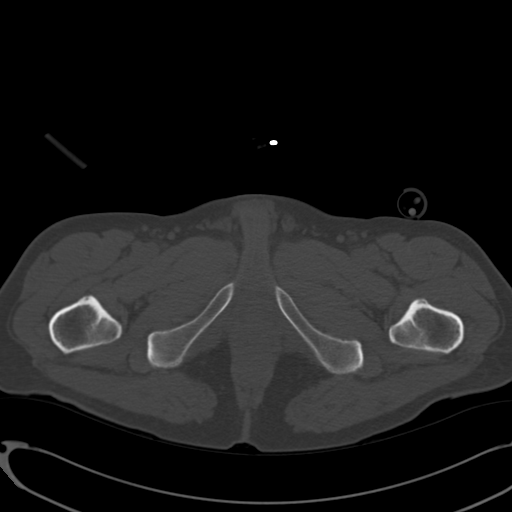
[im 22/149  soft-tissue]
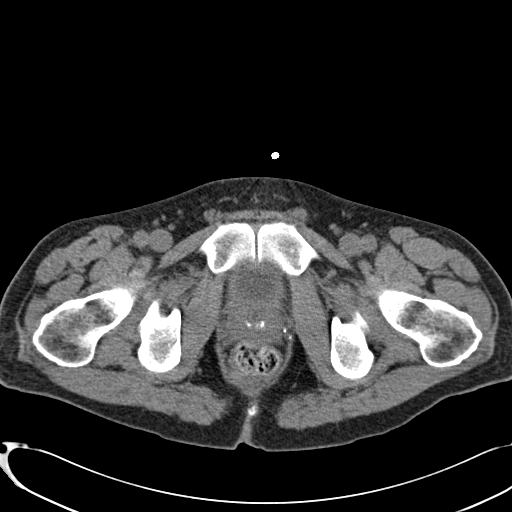
[im 32/149  soft-tissue]
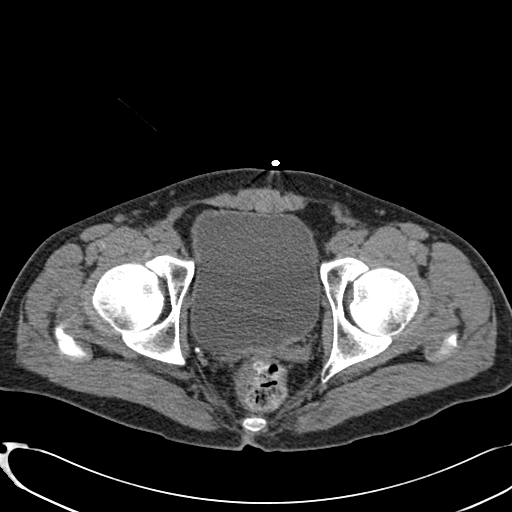
[im 43/149  soft-tissue]
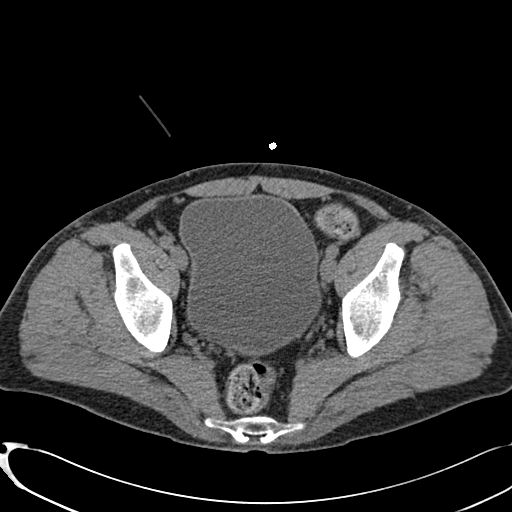
[im 53/149  soft-tissue]
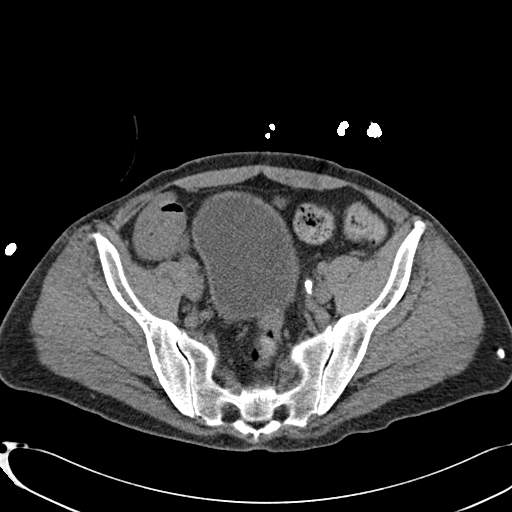
[im 64/149  soft-tissue]
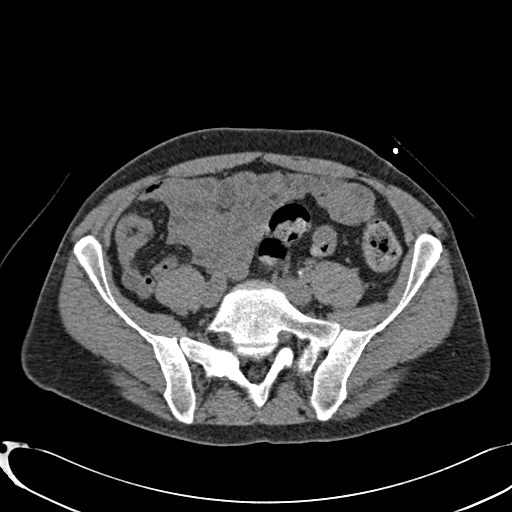
[im 75/149  soft-tissue]
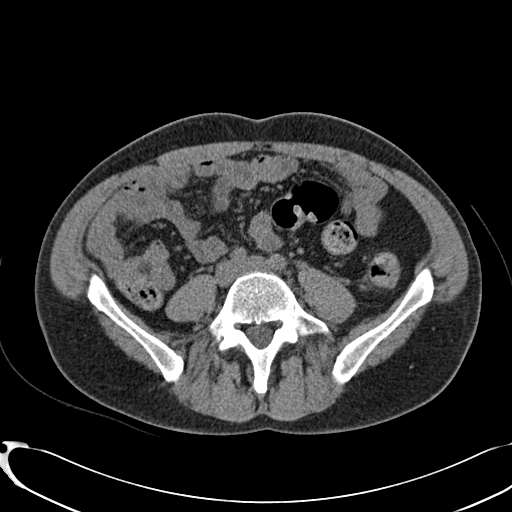
[im 85/149  soft-tissue]
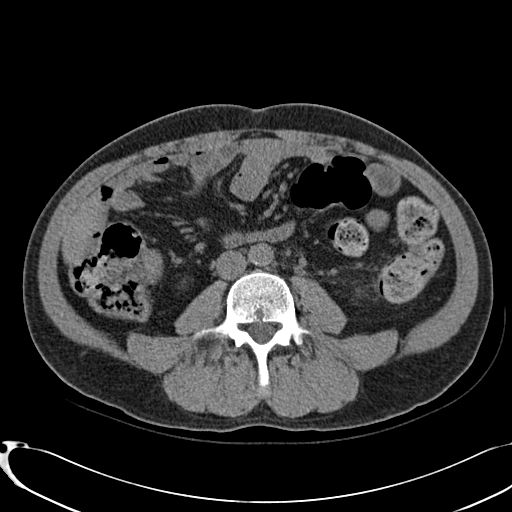
[im 96/149  soft-tissue]
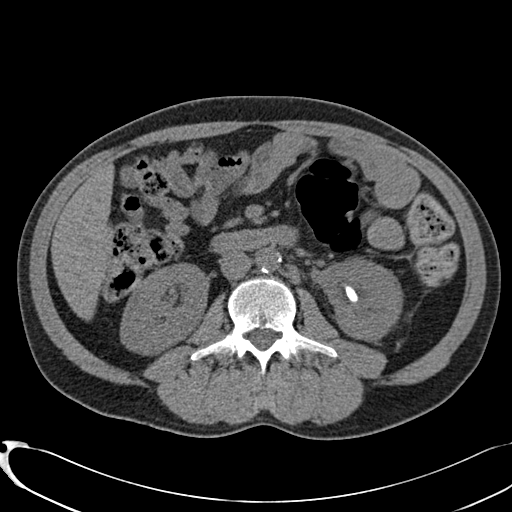
[im 96/149  bone]
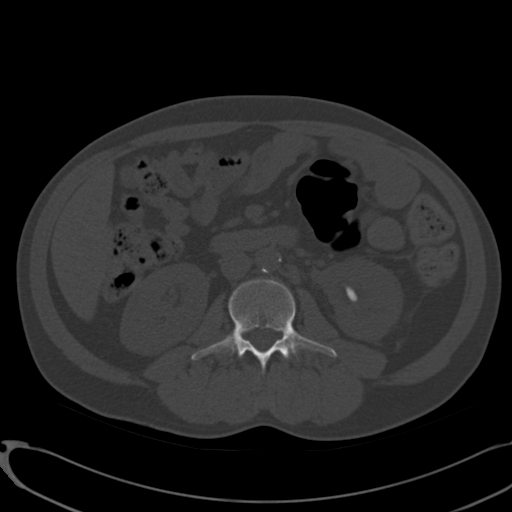
[im 106/149  soft-tissue]
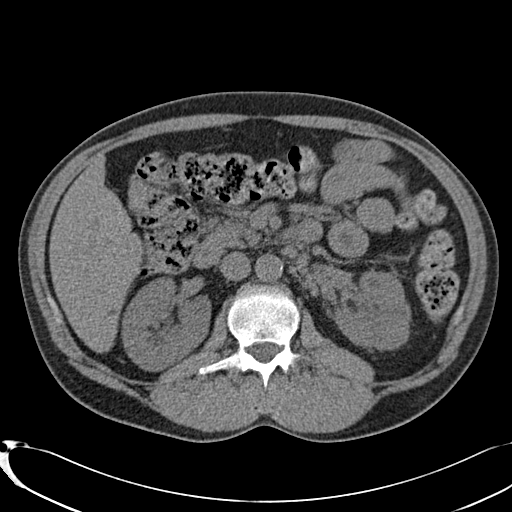
[im 117/149  soft-tissue]
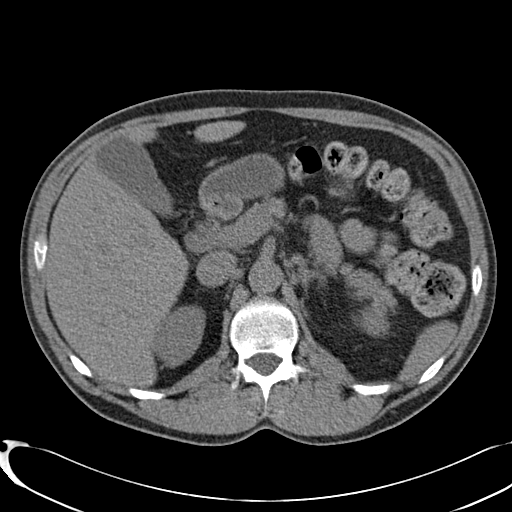
[im 127/149  soft-tissue]
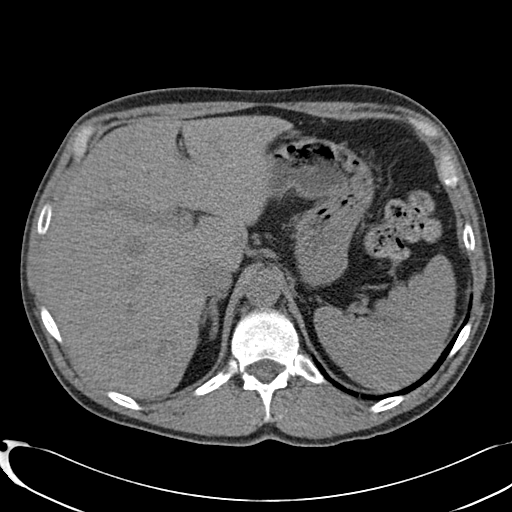
[im 127/149  lung]
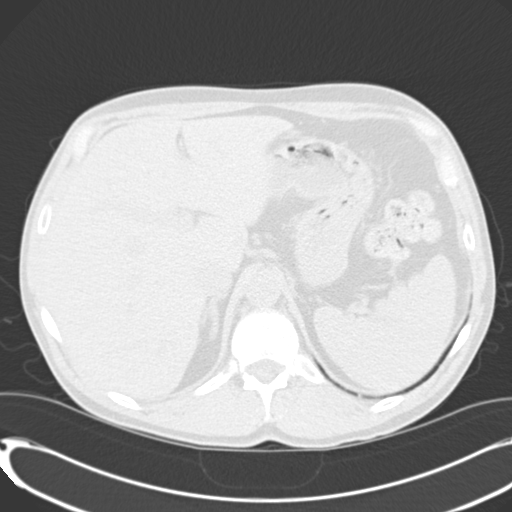
[im 133/149  lung]
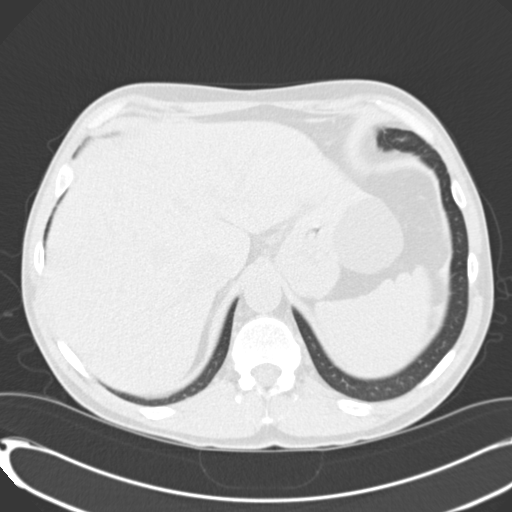
[im 138/149  soft-tissue]
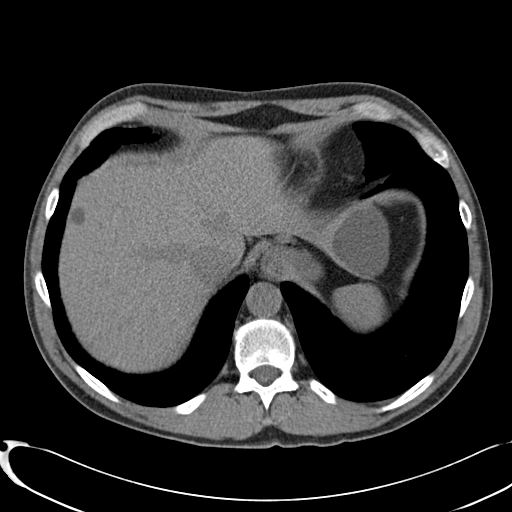
[im 138/149  lung]
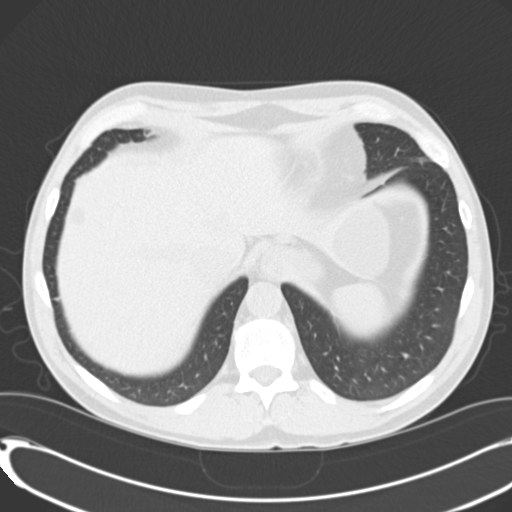
[im 143/149  lung]
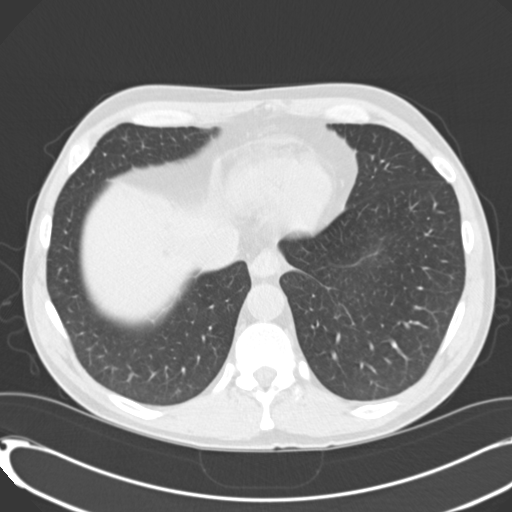

[15 of 32 positions shown; findings below may reference images not displayed]

COMPARISON STUDIES:      Prior CT of 08/12/2005.

PROCEDURE AND FINDINGS:      Standard nonenhanced CT of the abdomen and
pelvis is obtained.  Multiple hepatic cysts are present.  The spleen is
normal.  The pancreas is normal.  The RIGHT kidney is normal.  The
previously identified double-J ureteral stent on the LEFT has been removed.
Staghorn calculus fragments are noted in the LEFT renal collecting system.
The volume is diminished.  The previously identified large staghorn calculus
in the LEFT renal pelvis is no longer present.  There is LEFT hydronephrosis
and hydroureter.  This is very mild.  No evidence of significant hematoma is
noted on today's examination.  A calcified pelvic density is noted
consistent with phleboliths.
IMPRESSION: 1.     Interim removal of LEFT ureteral stent.  A large staghorn calculus
previously identified in the LEFT kidney has been largely removed.  There
are large fragments remaining in the LEFT renal collecting system.  No
obstructing ureteral stone is noted.
2.     Two small caliceal stones on the RIGHT are noted.

## 2008-08-03 IMAGING — CR DG ABDOMEN 1V
1 series · 2 of 2 positions shown · non-contrast
Comparison: none

REASON FOR EXAM: Nephrolithiasis
COMMENTS:

[Series 1: view not recorded · 0.17mm/px · 2 of 2 slices shown]
[im 1/2]
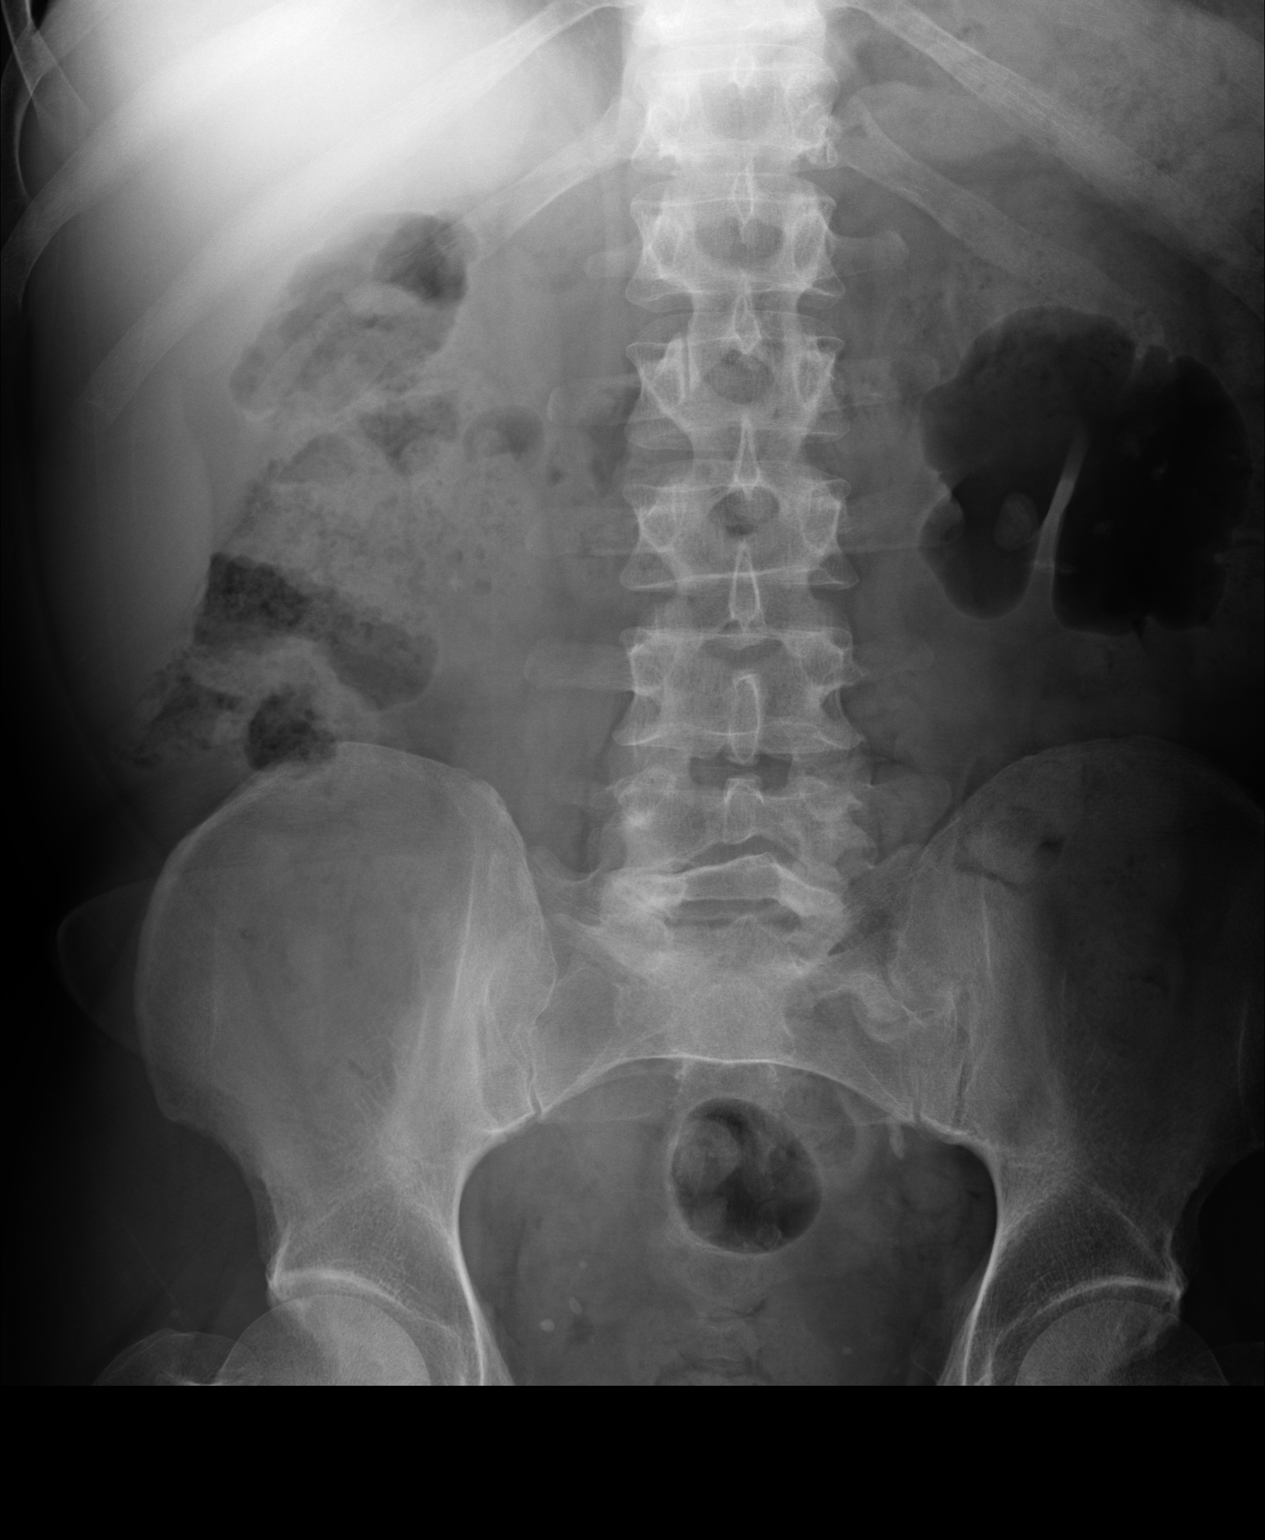
[im 2/2]
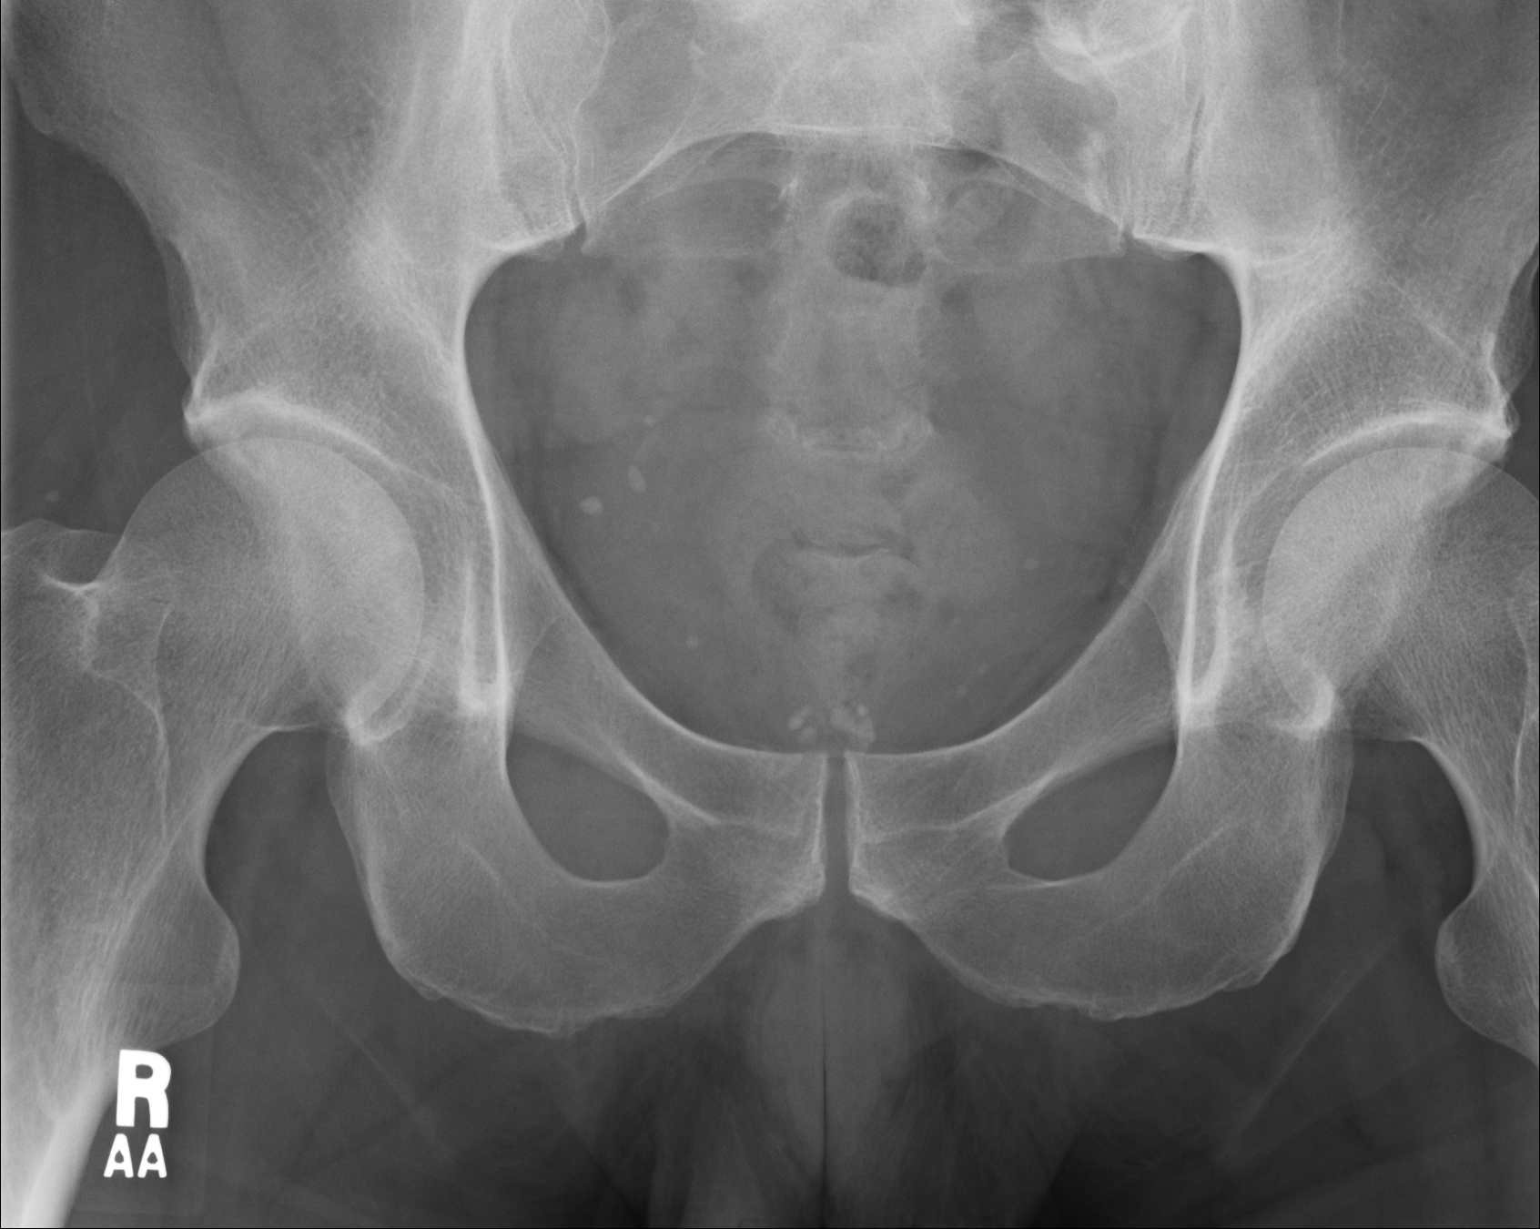

[2 of 2 positions shown; findings below may reference images not displayed]

PROCEDURE:     DXR - DXR KIDNEY URETER BLADDER  - January 09, 2006 [DATE]

RESULT:     Comparison is made to a prior study of 12/16/2005.

Calcified densities projecting in the region of the lower LEFT renal fossa
are vaguely apparent on the present study. There does appear to be decreased
conspicuity in the most inferior calcifications. Air is otherwise
appreciated within nondilated loops of large and small bowel. There appear
to be phleboliths versus distal small ureteral calculi within the pelvis.
IMPRESSION: 1.     Decreased conspicuity of the calculi within the LEFT renal fossa.
2.     Likely phleboliths within the pelvis.
3.     Note, not mentioned above, there also appears to be evidence of
possible prostate calcifications versus calcifications within the base of
the urinary bladder.
4.     Non-obstructive bowel gas pattern with a large amount of stool.

## 2008-10-12 IMAGING — CR DG ABDOMEN 1V
1 series · 1 of 1 positions shown · non-contrast
Comparison: none

REASON FOR EXAM: XRAY KUB NEPHROLITHIASIS PT NEED FILMS
COMMENTS:

[view not recorded]
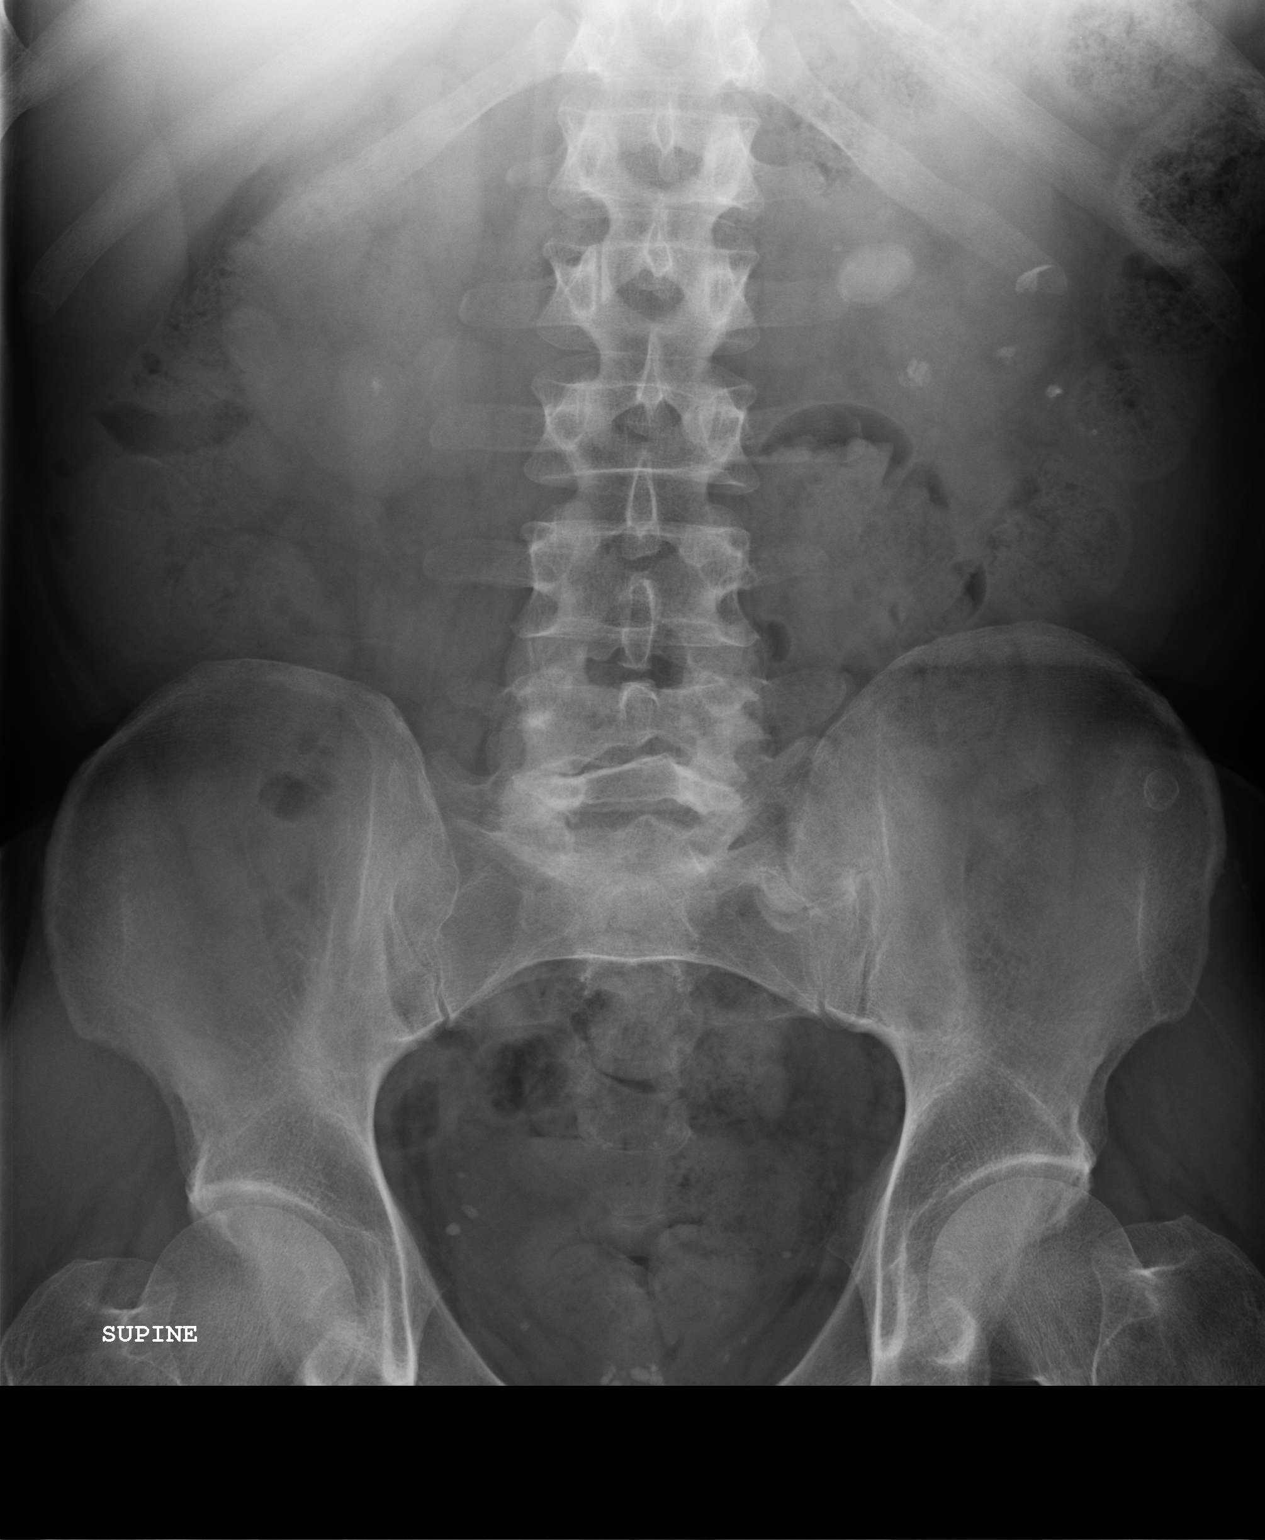

[1 of 1 positions shown; findings below may reference images not displayed]

PROCEDURE:     DXR - DXR KIDNEY URETER BLADDER  - March 20, 2006 [DATE]

RESULT:     AP view of the abdomen shows multiple calcifications projected
over the LEFT kidney consistent with LEFT renal stones and with there being
a 2 cm oval shaped density in the region of the LEFT renal pelvis.  On the
RIGHT there is a 2 mm calcification projected over the lower pole.  No
definite ureteral calcifications are identified. There is noted a small
amount of calcification in the prostate gland.
IMPRESSION: 1)Please see above.

## 2008-10-25 IMAGING — CR DG ABDOMEN 1V
1 series · 1 of 1 positions shown · non-contrast
Comparison: none

REASON FOR EXAM: renal calculi-lithotripsy
COMMENTS:

PROCEDURE:     DXR - DXR KIDNEY URETER BLADDER  - April 02, 2006  [DATE]
RESULT:          The soft tissue structures are unremarkable.  Dense stone
debris is noted over the LEFT kidney with a very large, approximately 2-3 cm
stone projected over the LEFT renal pelvis.

[view not recorded]
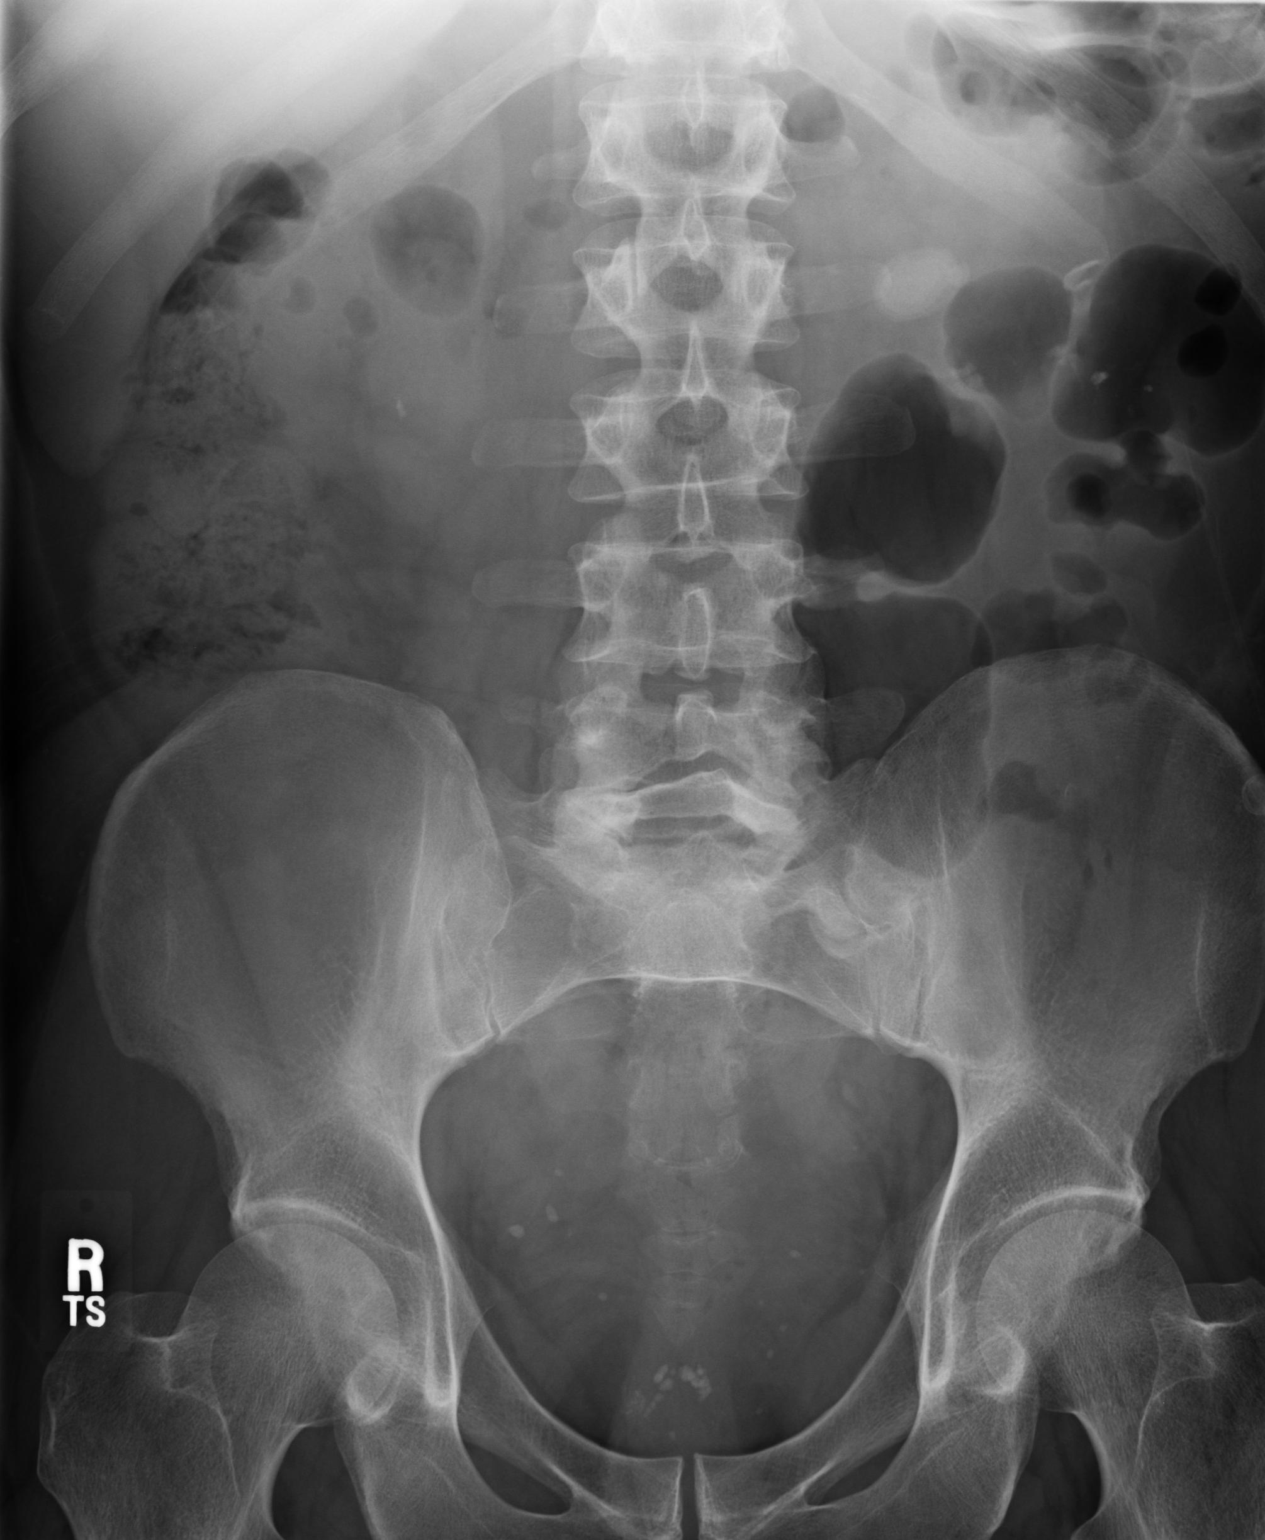

[1 of 1 positions shown; findings below may reference images not displayed]

IMPRESSION: 1.     LEFT nephrolithiasis, persistent 2-3 cm LEFT renal pelvic stone
noted.  Smaller stone debris is noted projected over the LEFT kidney.  A
tiny caliceal stone is noted on the RIGHT as well.
2.     Calcified pelvic phleboliths are noted.  A distal ureteral stone
cannot be entirely excluded.  The changes are most consistent with
phleboliths, and the phleboliths are in stable position from 03/20/2006.

## 2008-11-01 IMAGING — CR DG ABDOMEN 1V
1 series · 1 of 1 positions shown · non-contrast
Comparison: none

REASON FOR EXAM: Renal calculi-lithotripsy
COMMENTS:

[view not recorded]
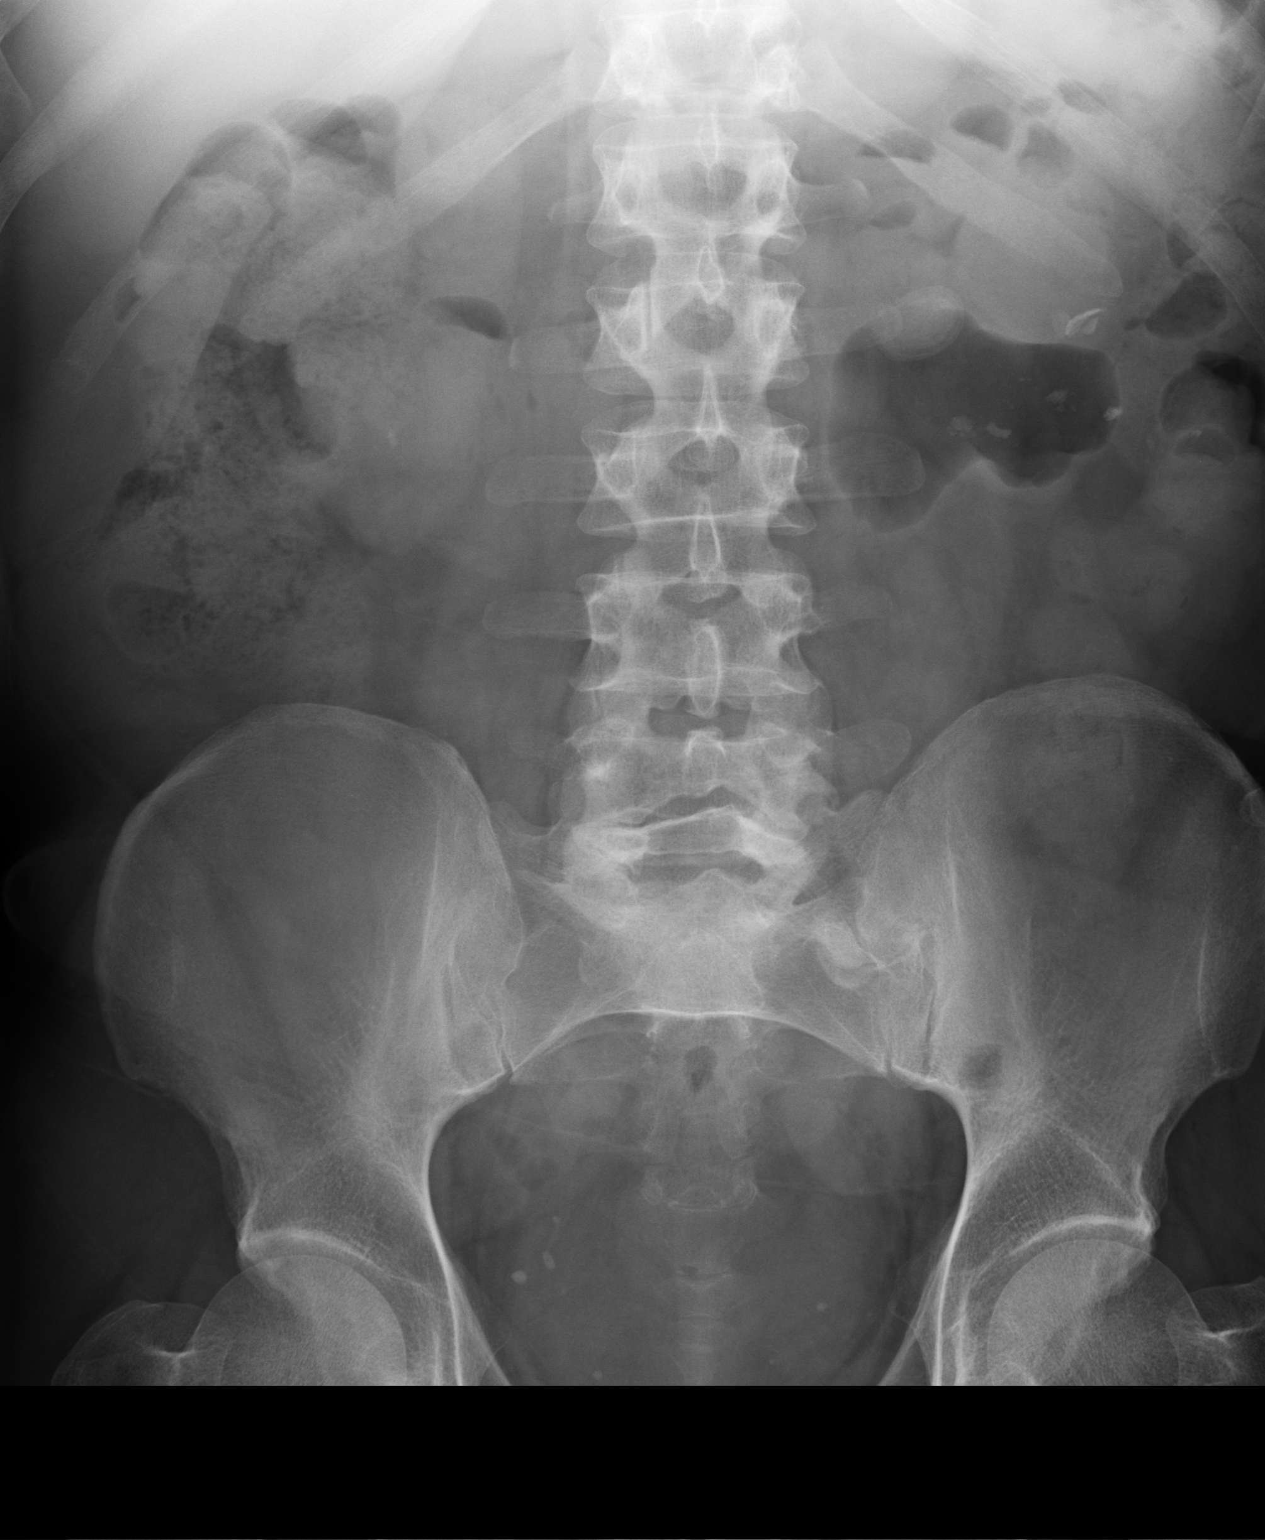

[1 of 1 positions shown; findings below may reference images not displayed]

PROCEDURE:     DXR - DXR KIDNEY URETER BLADDER  - April 09, 2006 [DATE]

RESULT:        An AP view of the abdomen is compared to the prior exam of
04/02/06.  There is again noted a 2.5 cm stone at the level of the LEFT renal
pelvis.  Additional calcifications are noted over the mid and lower pole
regions of the LEFT kidney.  There is a tiny 2-3 mm calcification projected
over the upper pole of the RIGHT kidney.  No definite ureteral stones are
identified.  Bilateral phleboliths are noted.  There is a small amount of
calcification in the prostate gland.
IMPRESSION: Please see above.

## 2008-11-09 IMAGING — CR DG ABDOMEN 1V
1 series · 1 of 1 positions shown · non-contrast
Comparison: none

REASON FOR EXAM: KUB NEPHROLITHIASIS PT NEED FILMS
COMMENTS:

[view not recorded]
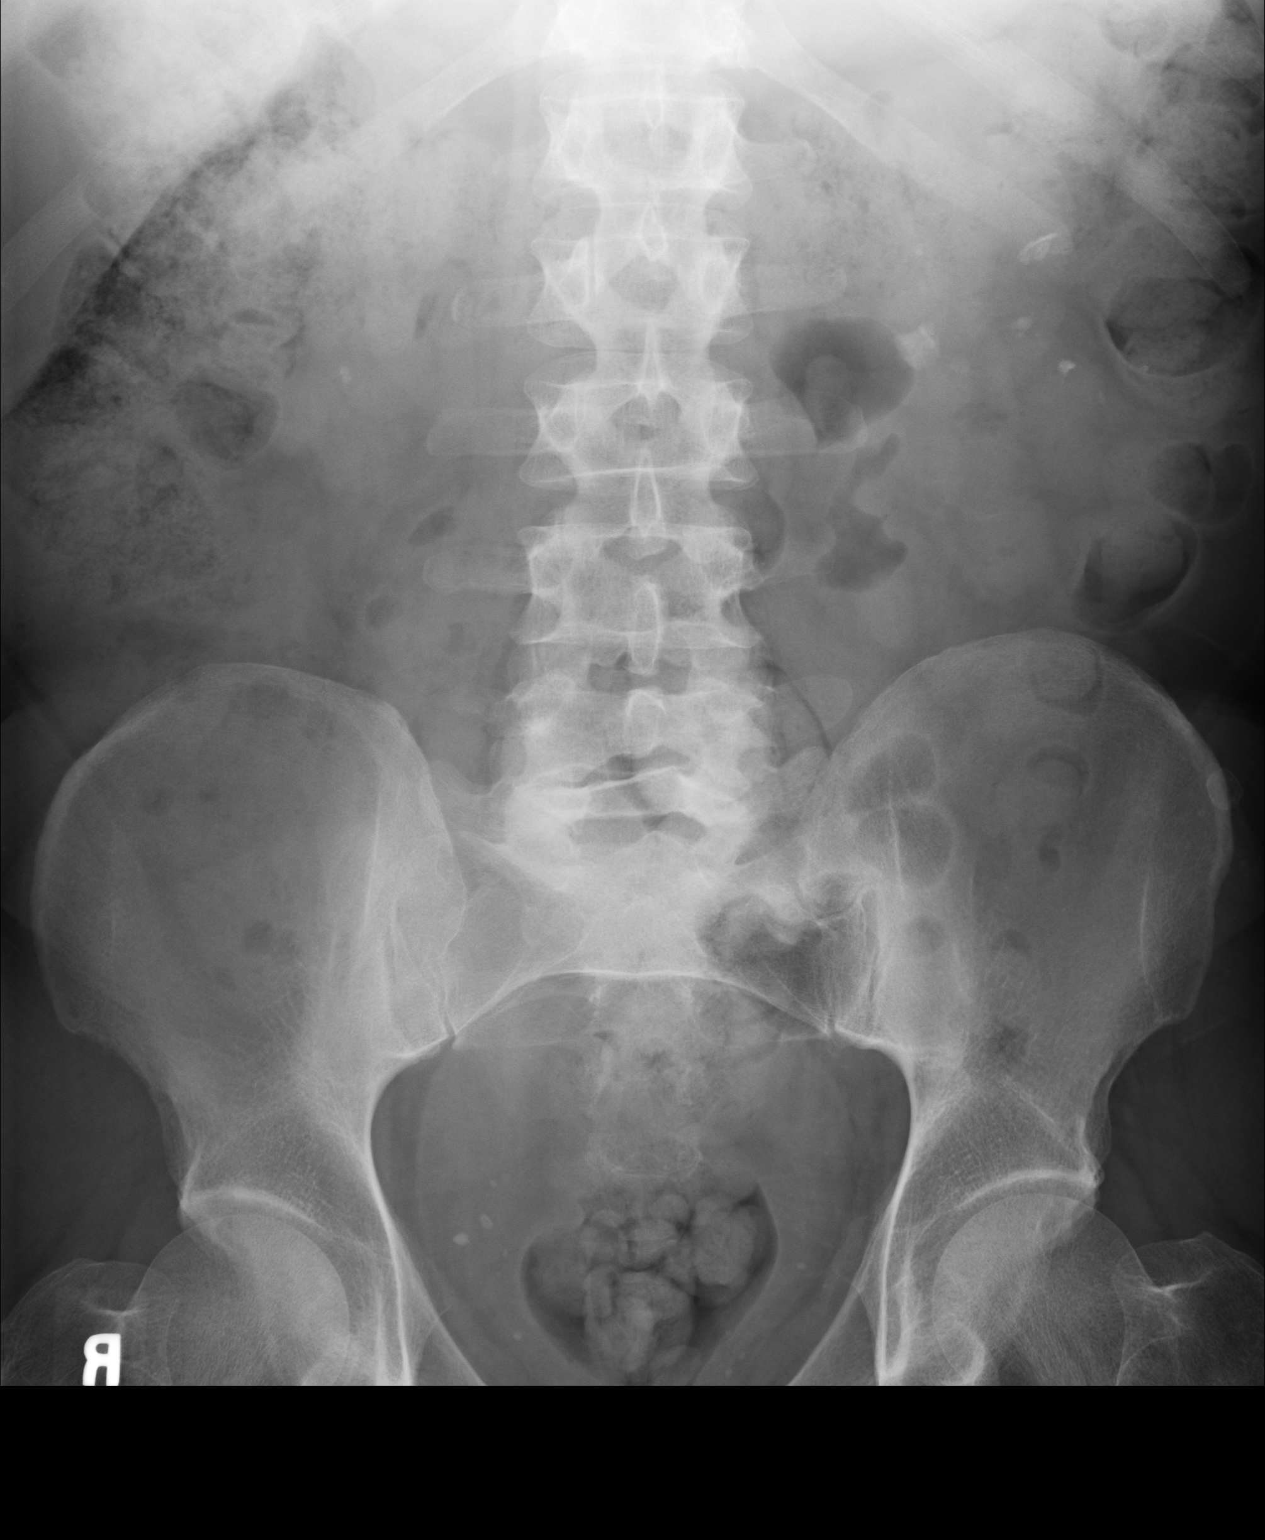

[1 of 1 positions shown; findings below may reference images not displayed]

PROCEDURE:     DXR - DXR KIDNEY URETER BLADDER  - April 17, 2006  [DATE]

RESULT:     Bilateral nephrolithiasis is noted.  Calcifications are noted in
the pelvis. These could represent phleboliths.  Distal ureteral stones
cannot be excluded. If need be, we can perform a CT or IVP for further
evaluation.
IMPRESSION: 1)Bilateral nephrolithiasis, see discussion above.

## 2008-11-10 ENCOUNTER — Ambulatory Visit: Payer: Self-pay | Admitting: Urology

## 2008-11-15 ENCOUNTER — Ambulatory Visit: Payer: Self-pay | Admitting: Urology

## 2008-11-22 IMAGING — CR DG ABDOMEN 1V
1 series · 1 of 1 positions shown · non-contrast
Comparison: none

REASON FOR EXAM: NEPHROLITHIASIS
COMMENTS:

[view not recorded]
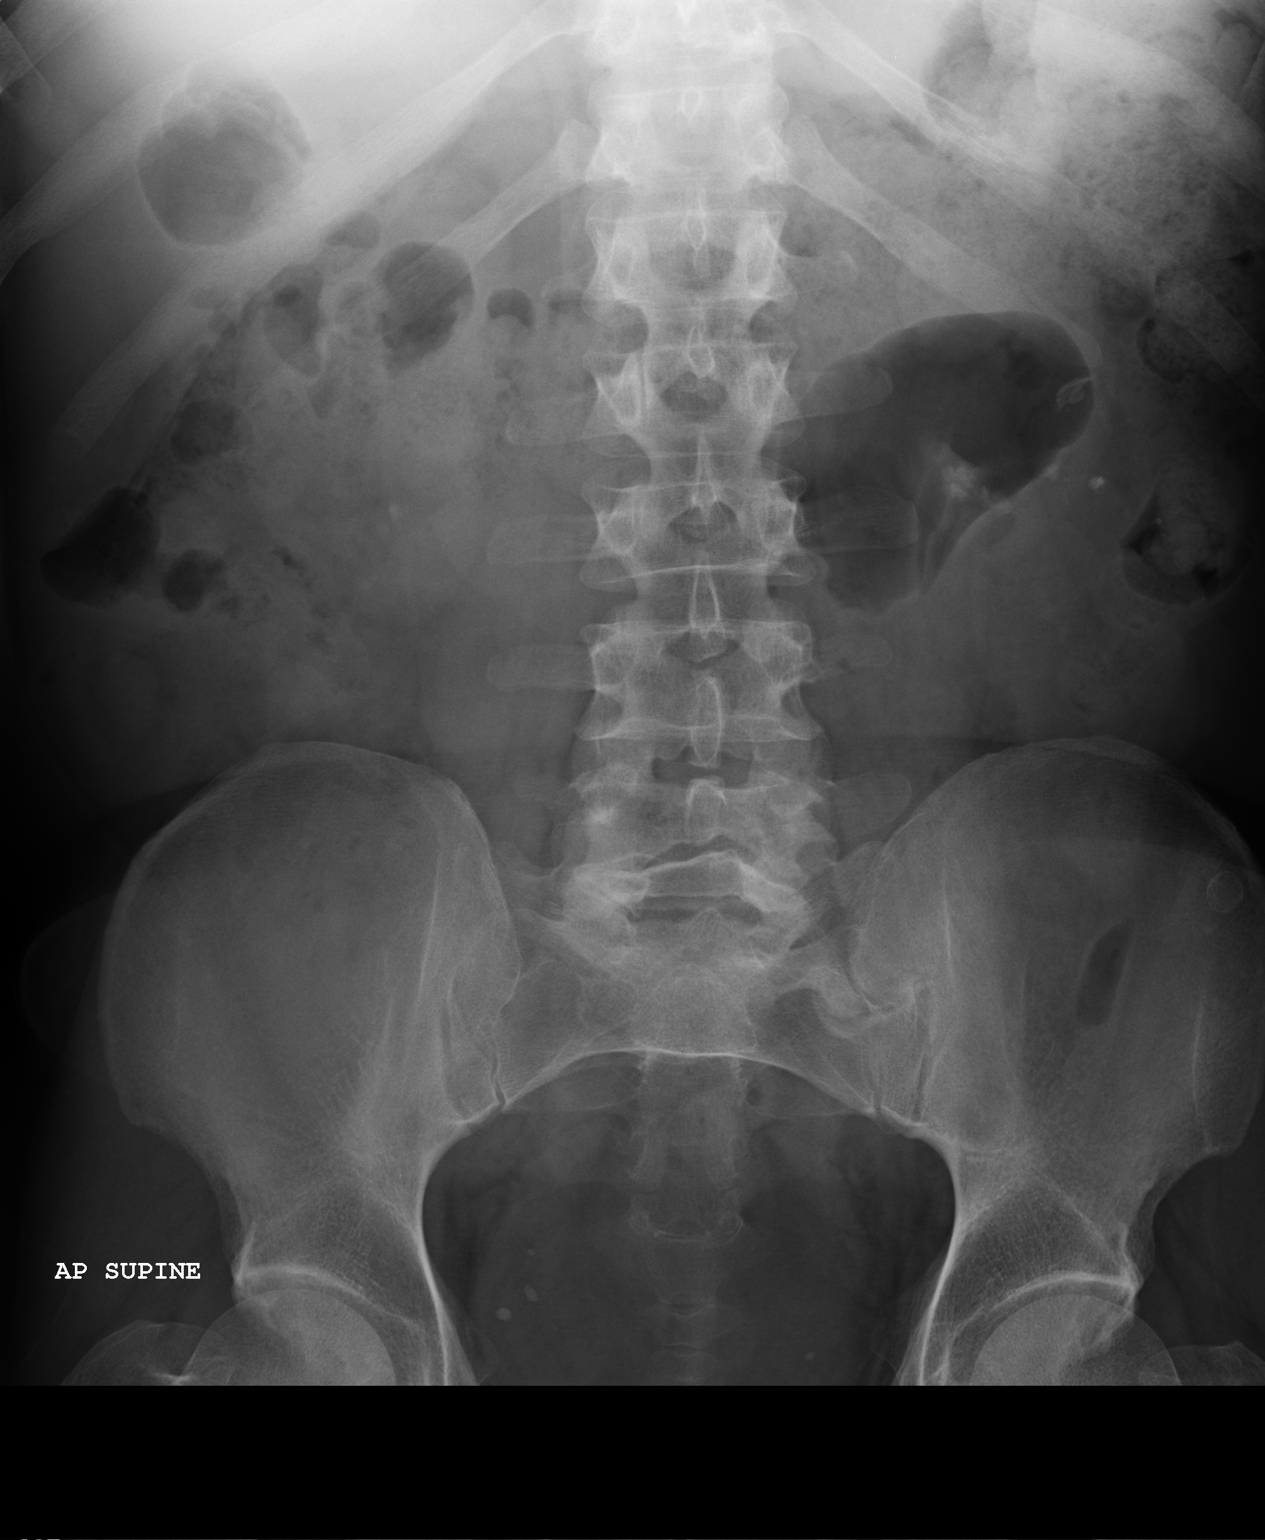

[1 of 1 positions shown; findings below may reference images not displayed]

PROCEDURE:     DXR - DXR KIDNEY URETER BLADDER  - April 30, 2006  [DATE]

RESULT:     AP view of the abdomen shows multiple calcifications projected
over the LEFT kidney compatible with LEFT renal stones. The stones do not
appear to have changed appreciably in location as compared to the prior exam
of 04/17/2006. There is noted a 3.0 to 4.0 mm calcification projected over
the lower pole of the RIGHT kidney compatible with a RIGHT renal stone.
There are noted a few phleboliths in the pelvis. Calcification is seen in
the region of the prostate gland. No definite ureteral calcifications are
identified.
IMPRESSION: Please see above.

## 2008-12-14 ENCOUNTER — Ambulatory Visit: Payer: Self-pay | Admitting: Urology

## 2008-12-26 ENCOUNTER — Ambulatory Visit: Payer: Self-pay | Admitting: Urology

## 2009-01-01 IMAGING — US US RENAL KIDNEY
1 series · 17 of 25 positions shown · non-contrast
Comparison: none

REASON FOR EXAM: Nephrolithiasis
COMMENTS:

[Series 1: us renal kidney · 17 of 28 slices shown]
[im 1/28]
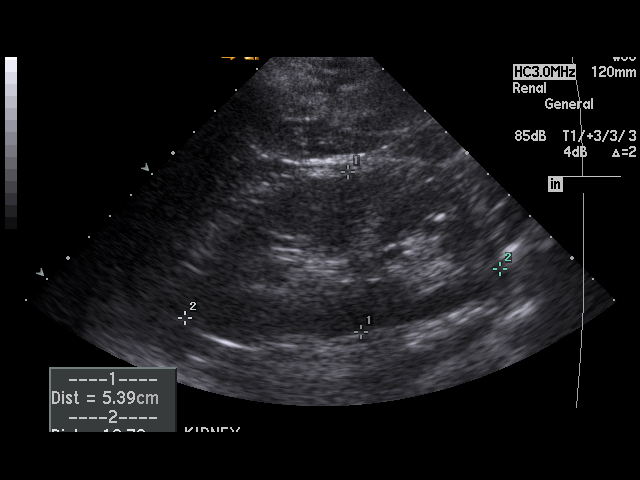
[im 3/28]
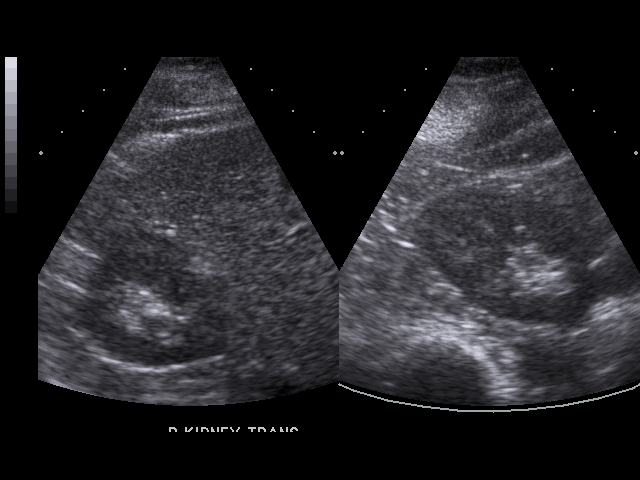
[im 4/28]
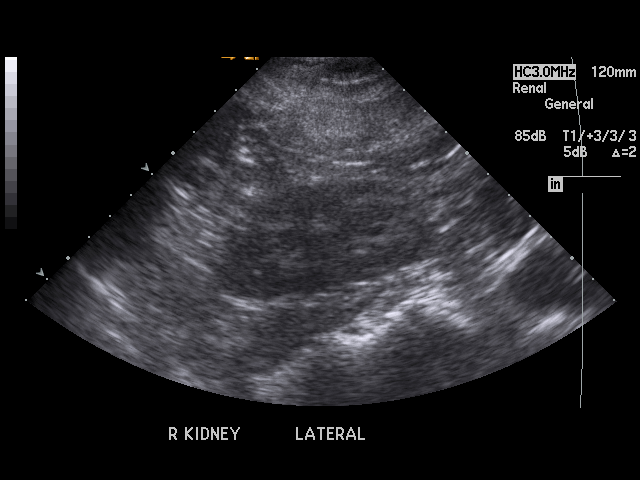
[im 6/28]
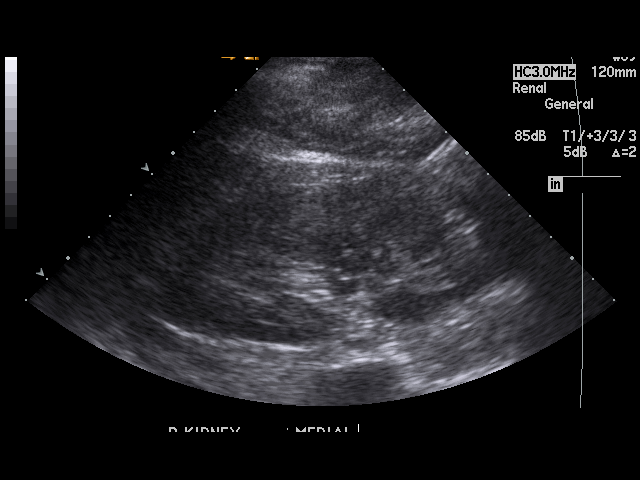
[im 7/28]
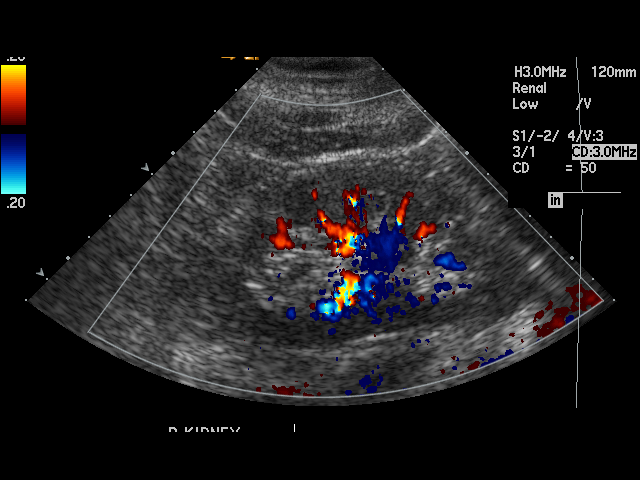
[im 10/28]
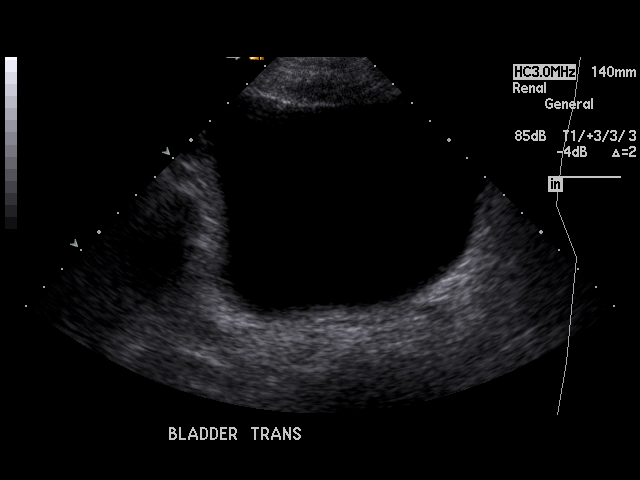
[im 11/28]
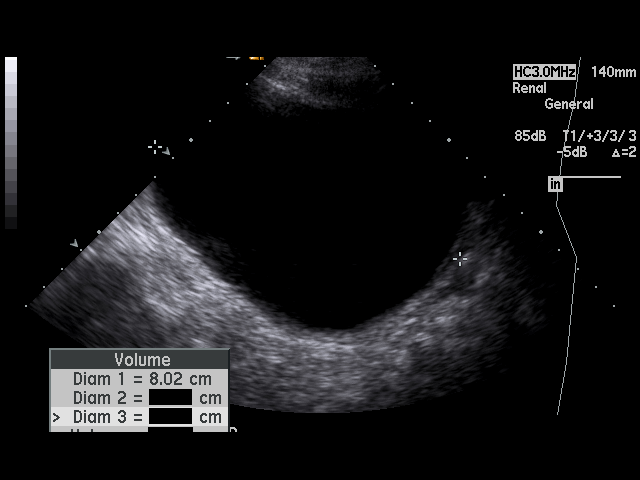
[im 13/28]
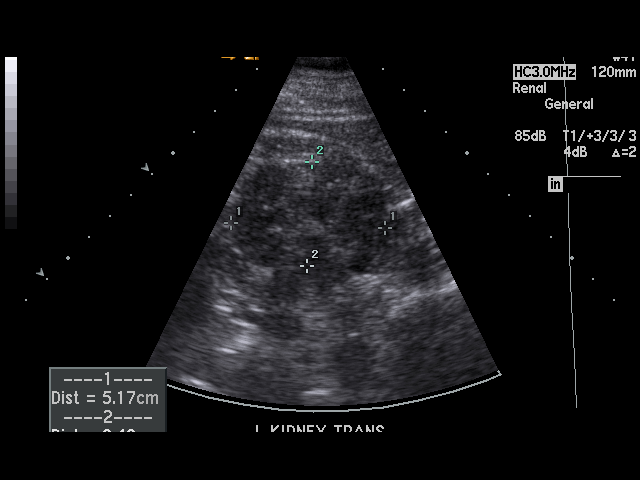
[im 14/28]
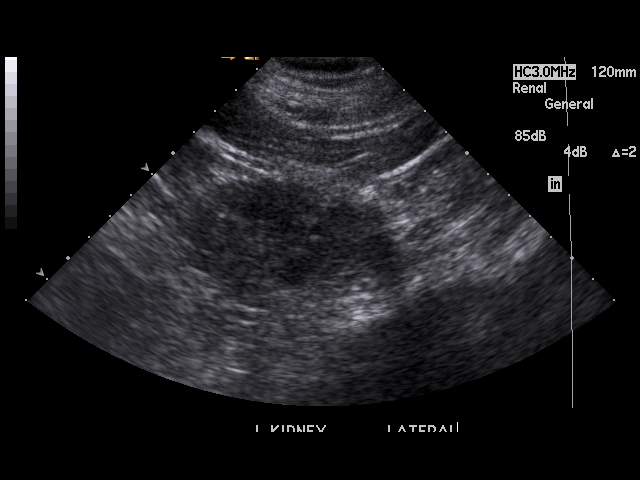
[im 15/28]
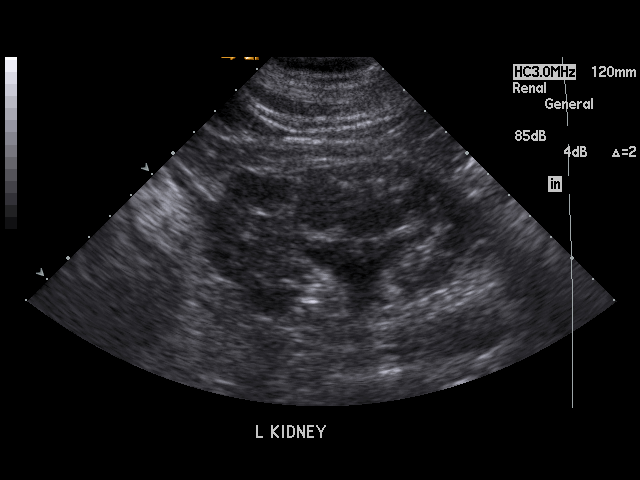
[im 17/28]
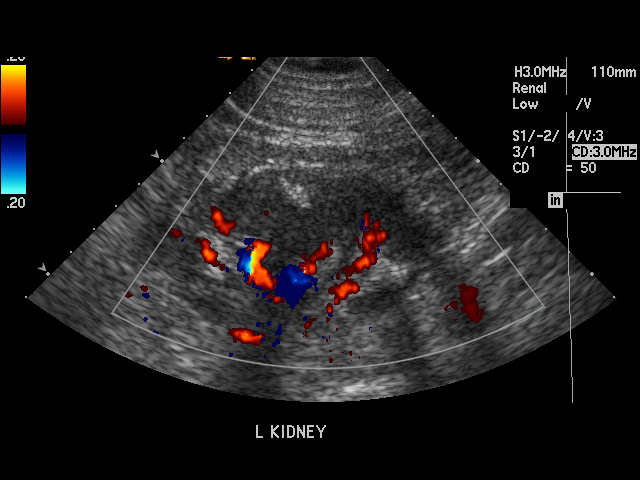
[im 19/28]
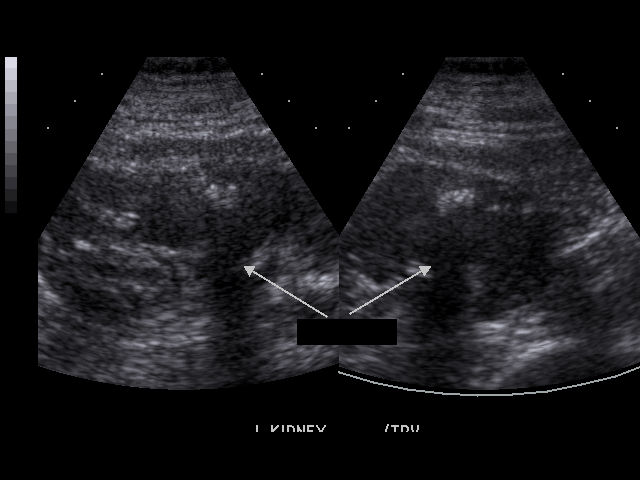
[im 21/28]
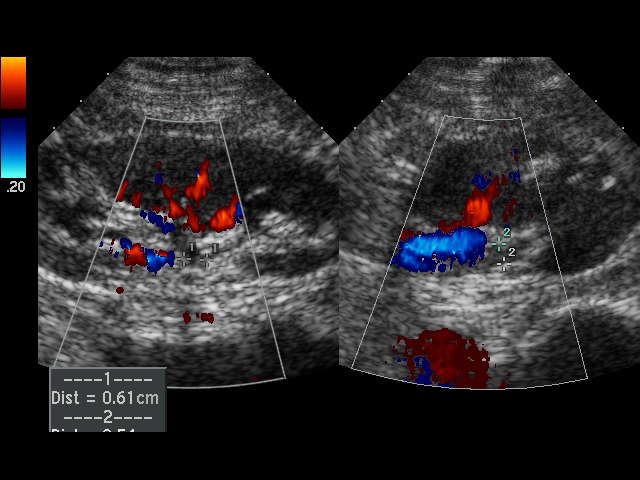
[im 22/28]
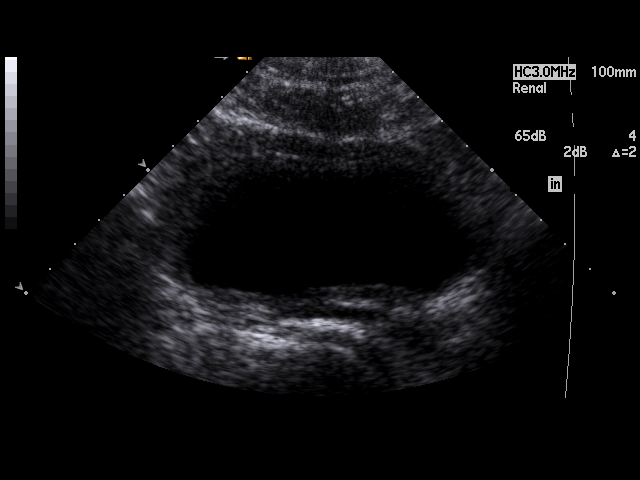
[im 24/28]
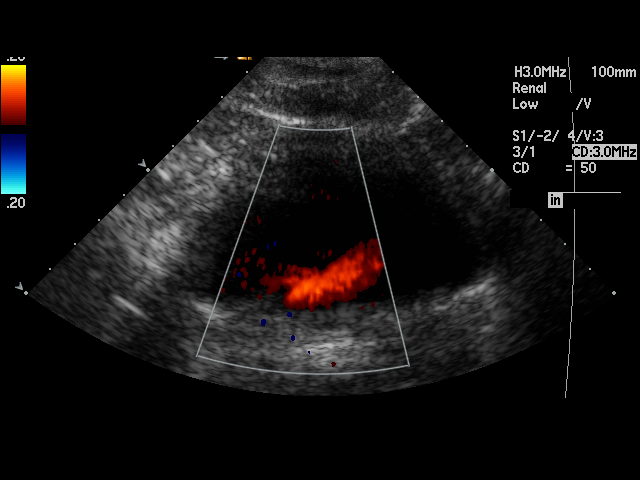
[im 25/28]
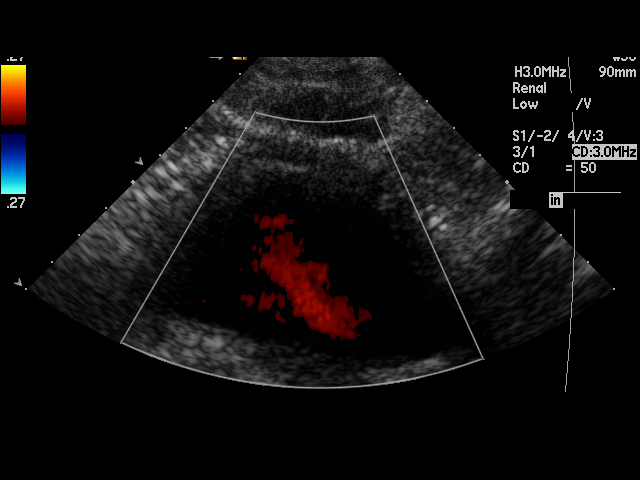
[im 28/28]
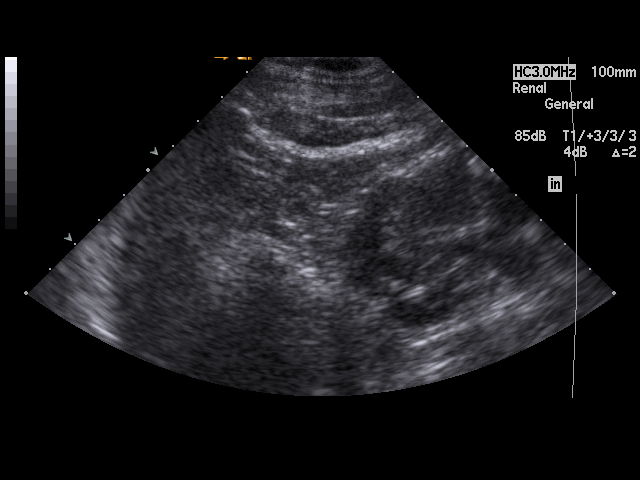

[17 of 25 positions shown; findings below may reference images not displayed]

PROCEDURE:     US  - US KIDNEY BILATERAL  - June 09, 2006  [DATE]

RESULT:     There is a calcification or a cluster of small calcifications in
the midpole region of the LEFT kidney.  No definite renal calcifications are
identified on the RIGHT.  There is mild prominence of the proximal LEFT
renal collecting system.  Note is made that this diminishes on repeat exam
after voiding.  The filled urinary bladder volume measured 563 cubic
centimeters and the post-void urinary volume measured 83 cubic centimeters.

The renal cortical margins are smooth.  No renal mass lesions are
identified.
IMPRESSION: There is mild hydronephrosis on the LEFT which diminishes after voiding.

There is a calcification or cluster of small calcifications in the midpole
region of the LEFT kidney.

No definite RIGHT renal calcifications are seen.

## 2009-02-09 ENCOUNTER — Ambulatory Visit: Payer: Self-pay | Admitting: Urology

## 2009-04-05 IMAGING — CR DG ABDOMEN 1V
1 series · 2 of 2 positions shown · non-contrast
Comparison: none

REASON FOR EXAM: NEPHROLITHIASIS
COMMENTS:

[Series 1: view not recorded · 0.17mm/px · 2 of 2 slices shown]
[im 1/2]
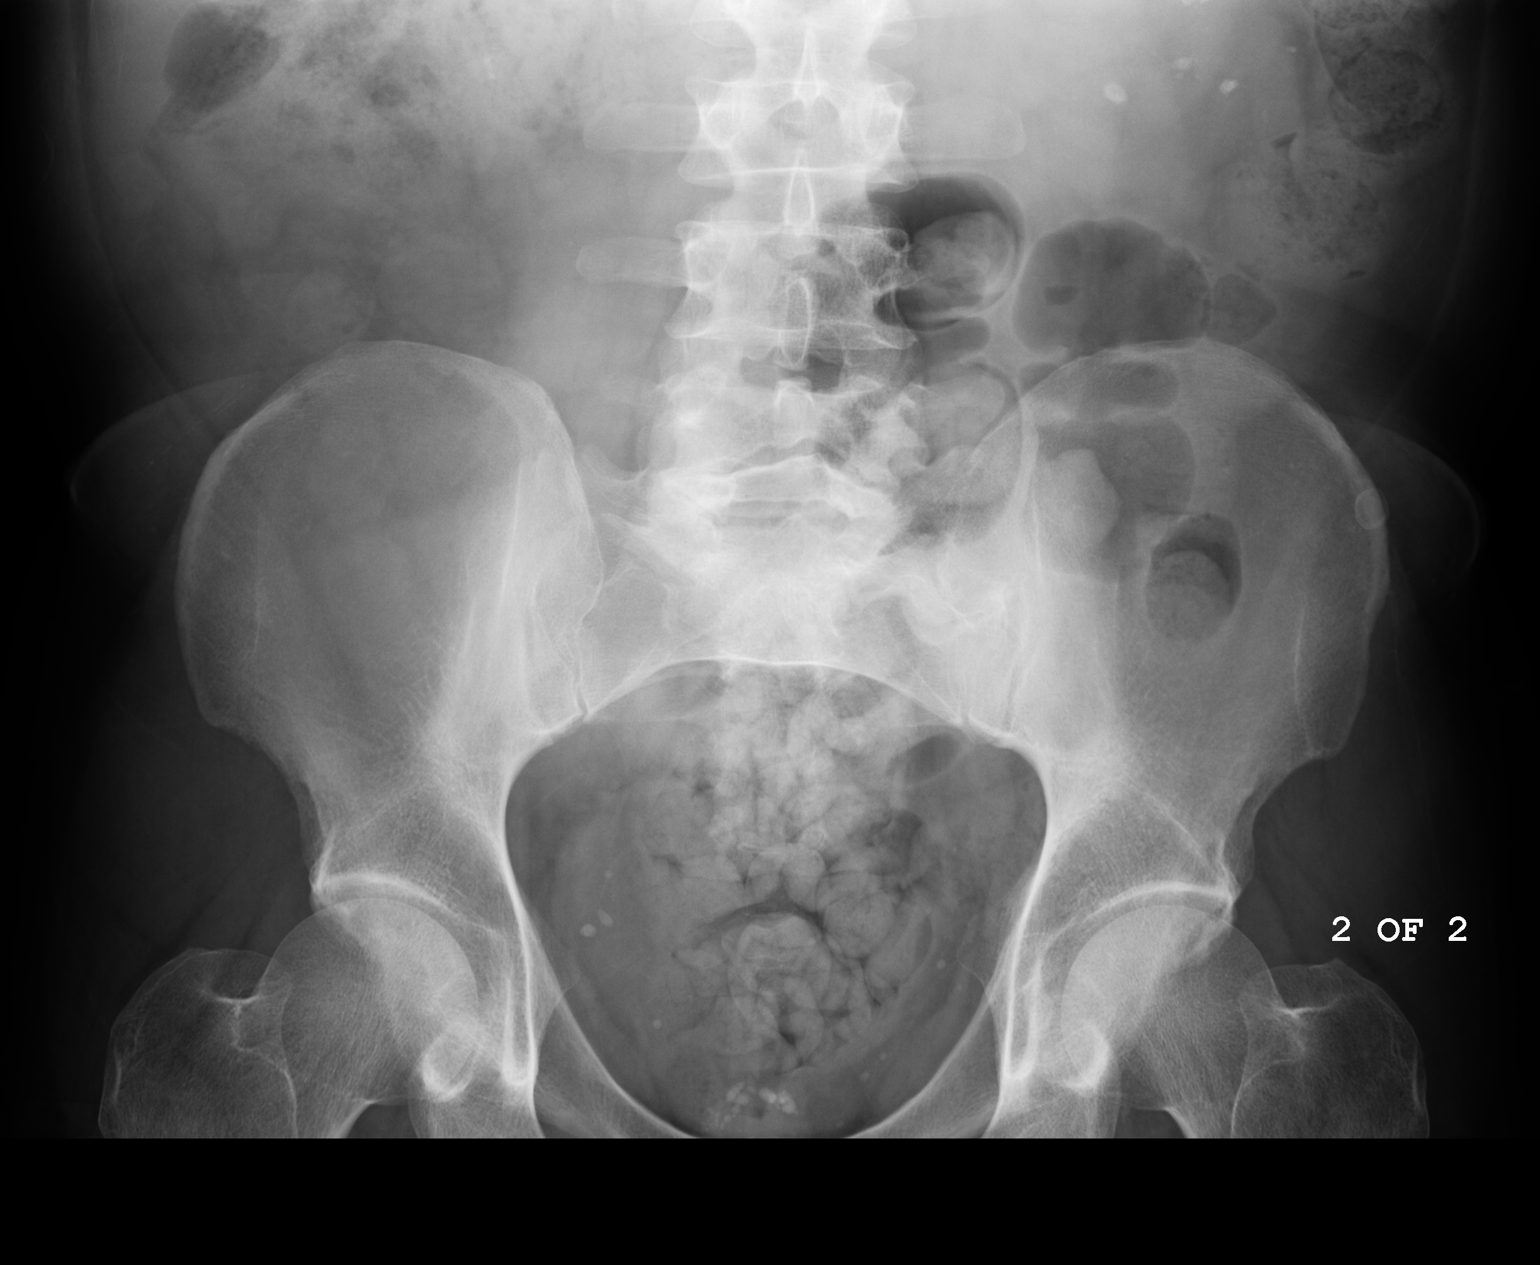
[im 2/2]
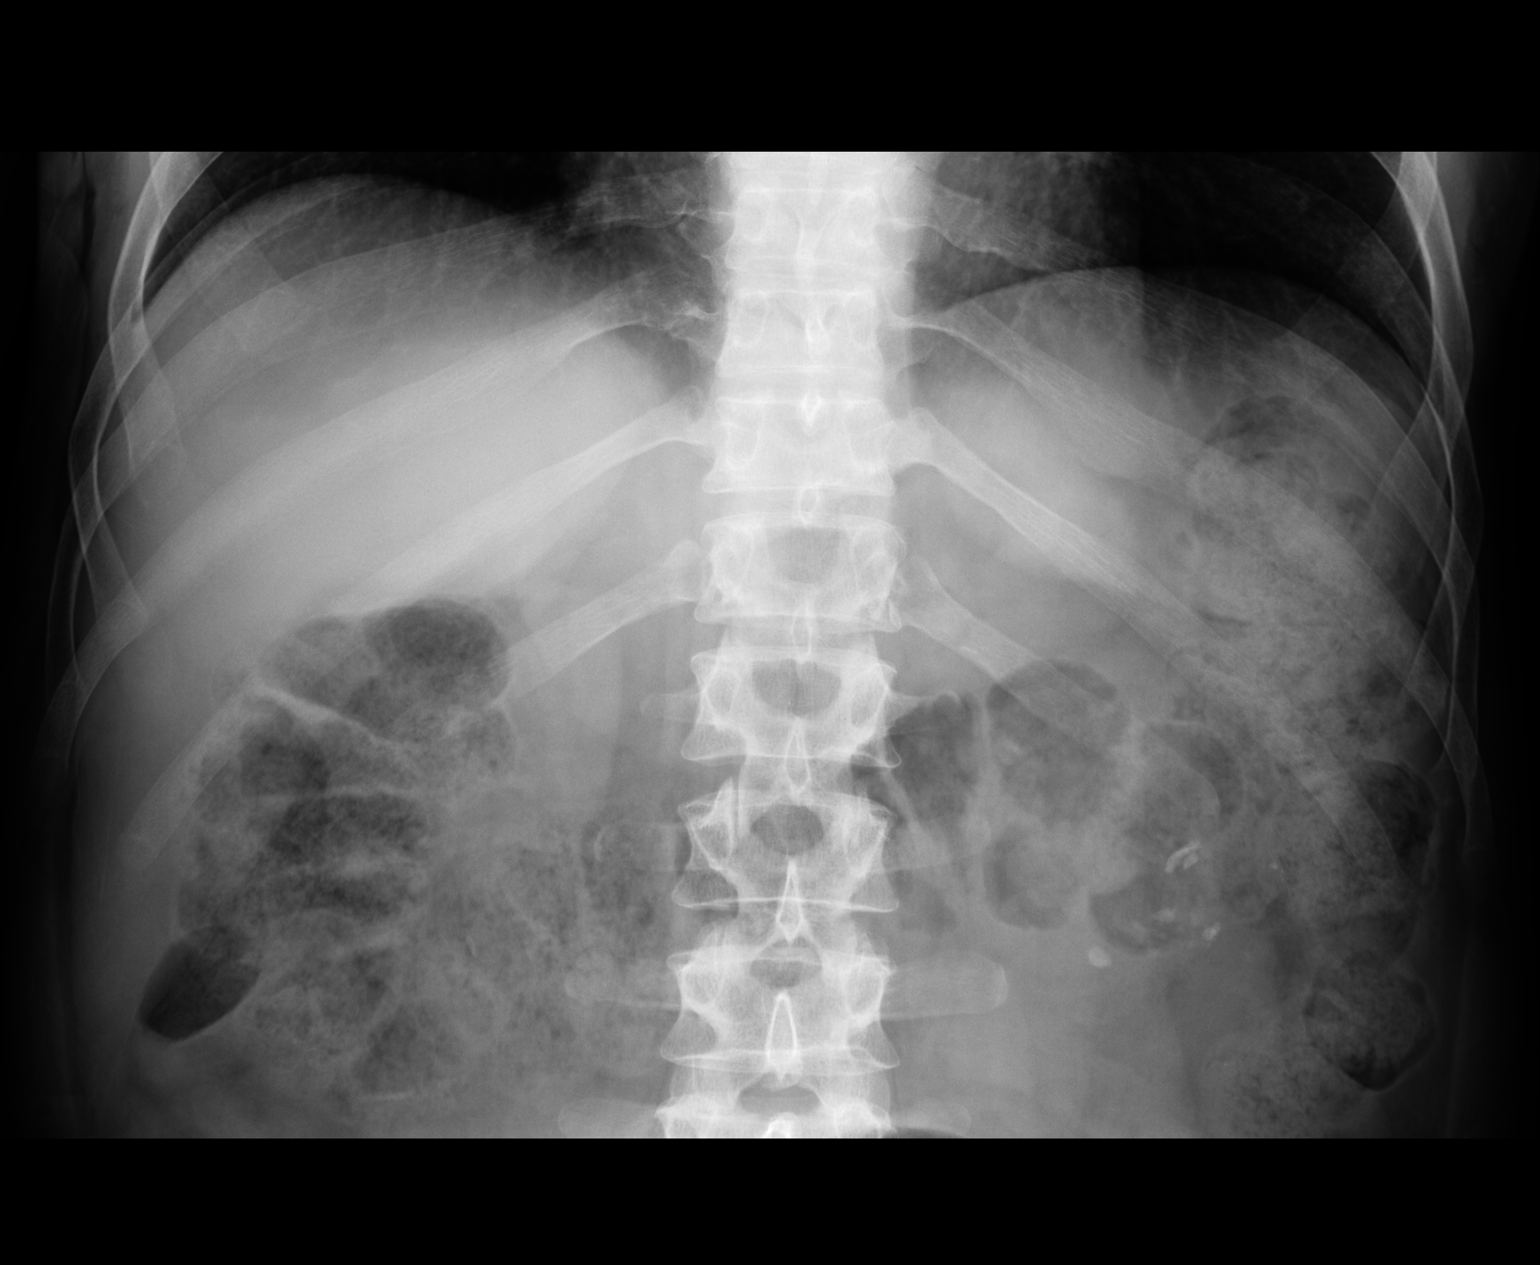

[2 of 2 positions shown; findings below may reference images not displayed]

PROCEDURE:     DXR - DXR KIDNEY URETER BLADDER  - September 11, 2006 [DATE]

RESULT:     There are multiple calcifications projected over the lower pole
of the LEFT kidney consistent with LEFT renal stones. No definite RIGHT
renal stones are identified on this exam but the RIGHT kidney is in great
part obscured by overlying bowel and bowel content. There are again noted
multiple calcifications in the pelvis. These are most consistent with
phleboliths. No definite ureteral stones are seen.
IMPRESSION: Please see above.

## 2009-05-23 IMAGING — CR DG ABDOMEN 1V
1 series · 3 of 3 positions shown · non-contrast
Comparison: none

REASON FOR EXAM: xray kub nephrolithiasis pt need films
COMMENTS:

[Series 1: view not recorded · 0.17mm/px · 3 of 3 slices shown]
[im 1/3]
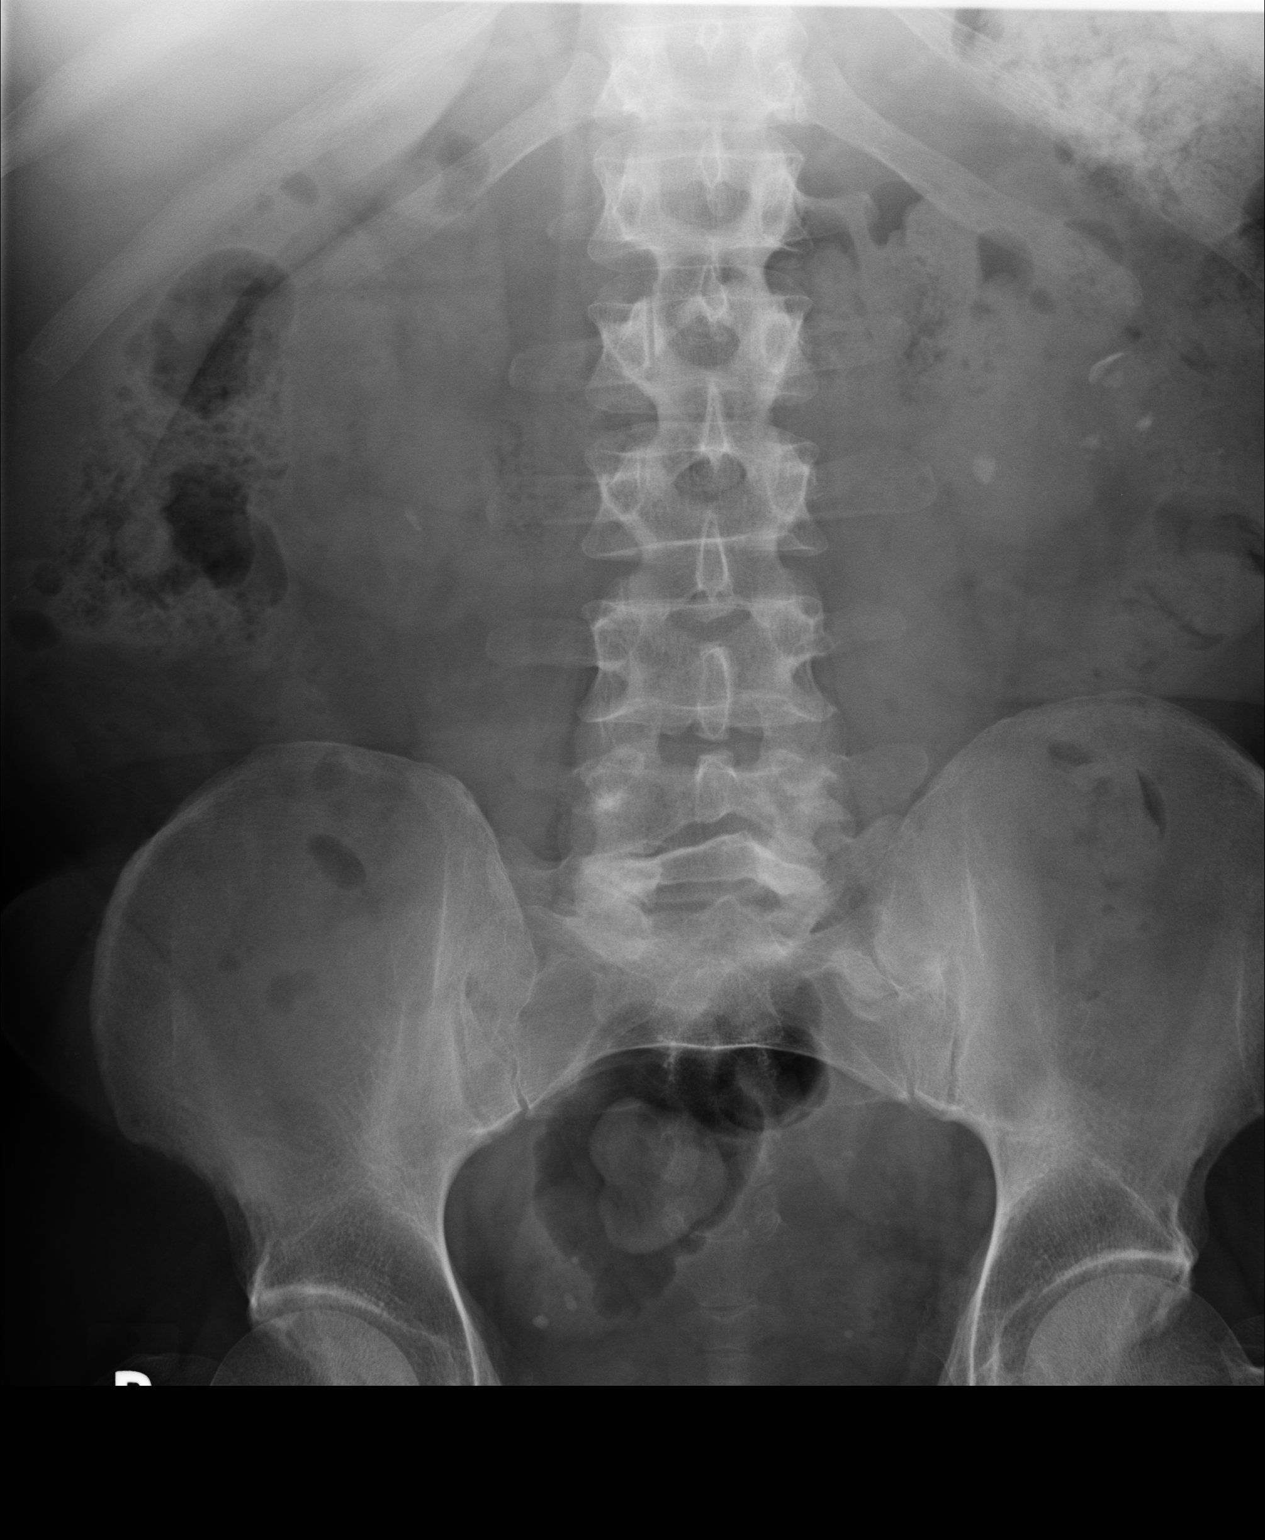
[im 2/3]
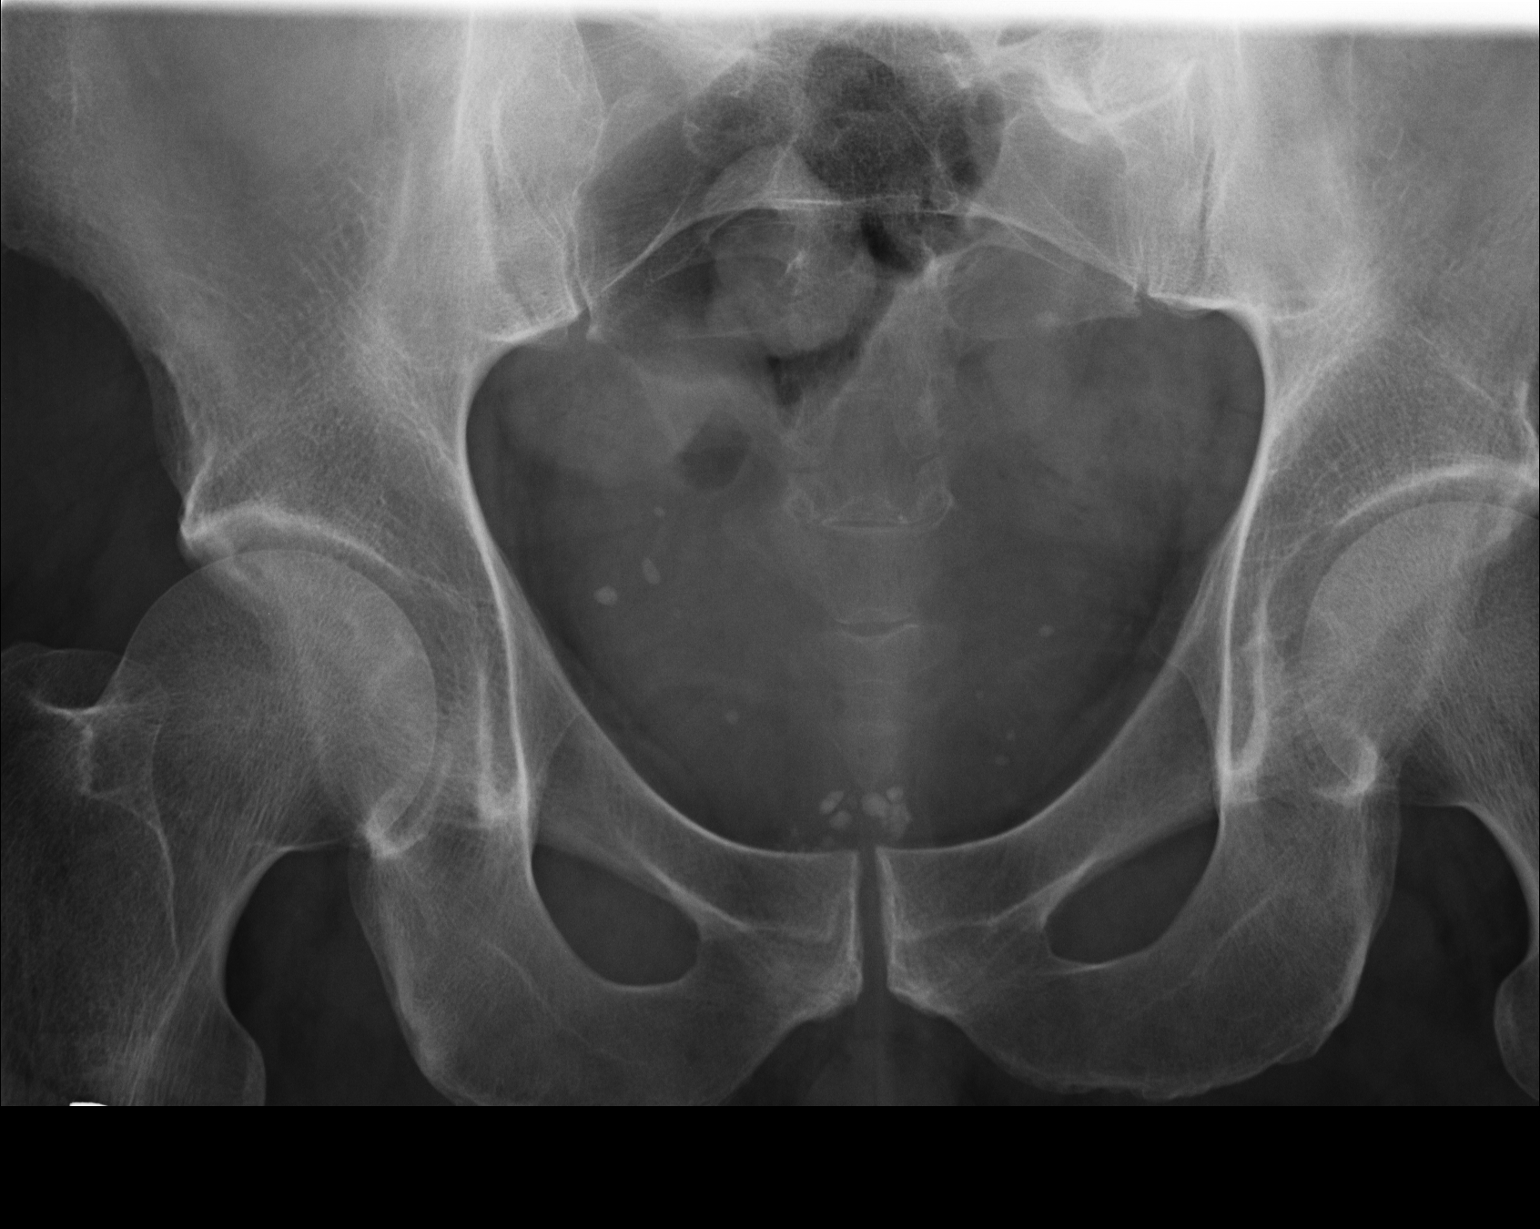
[im 3/3]
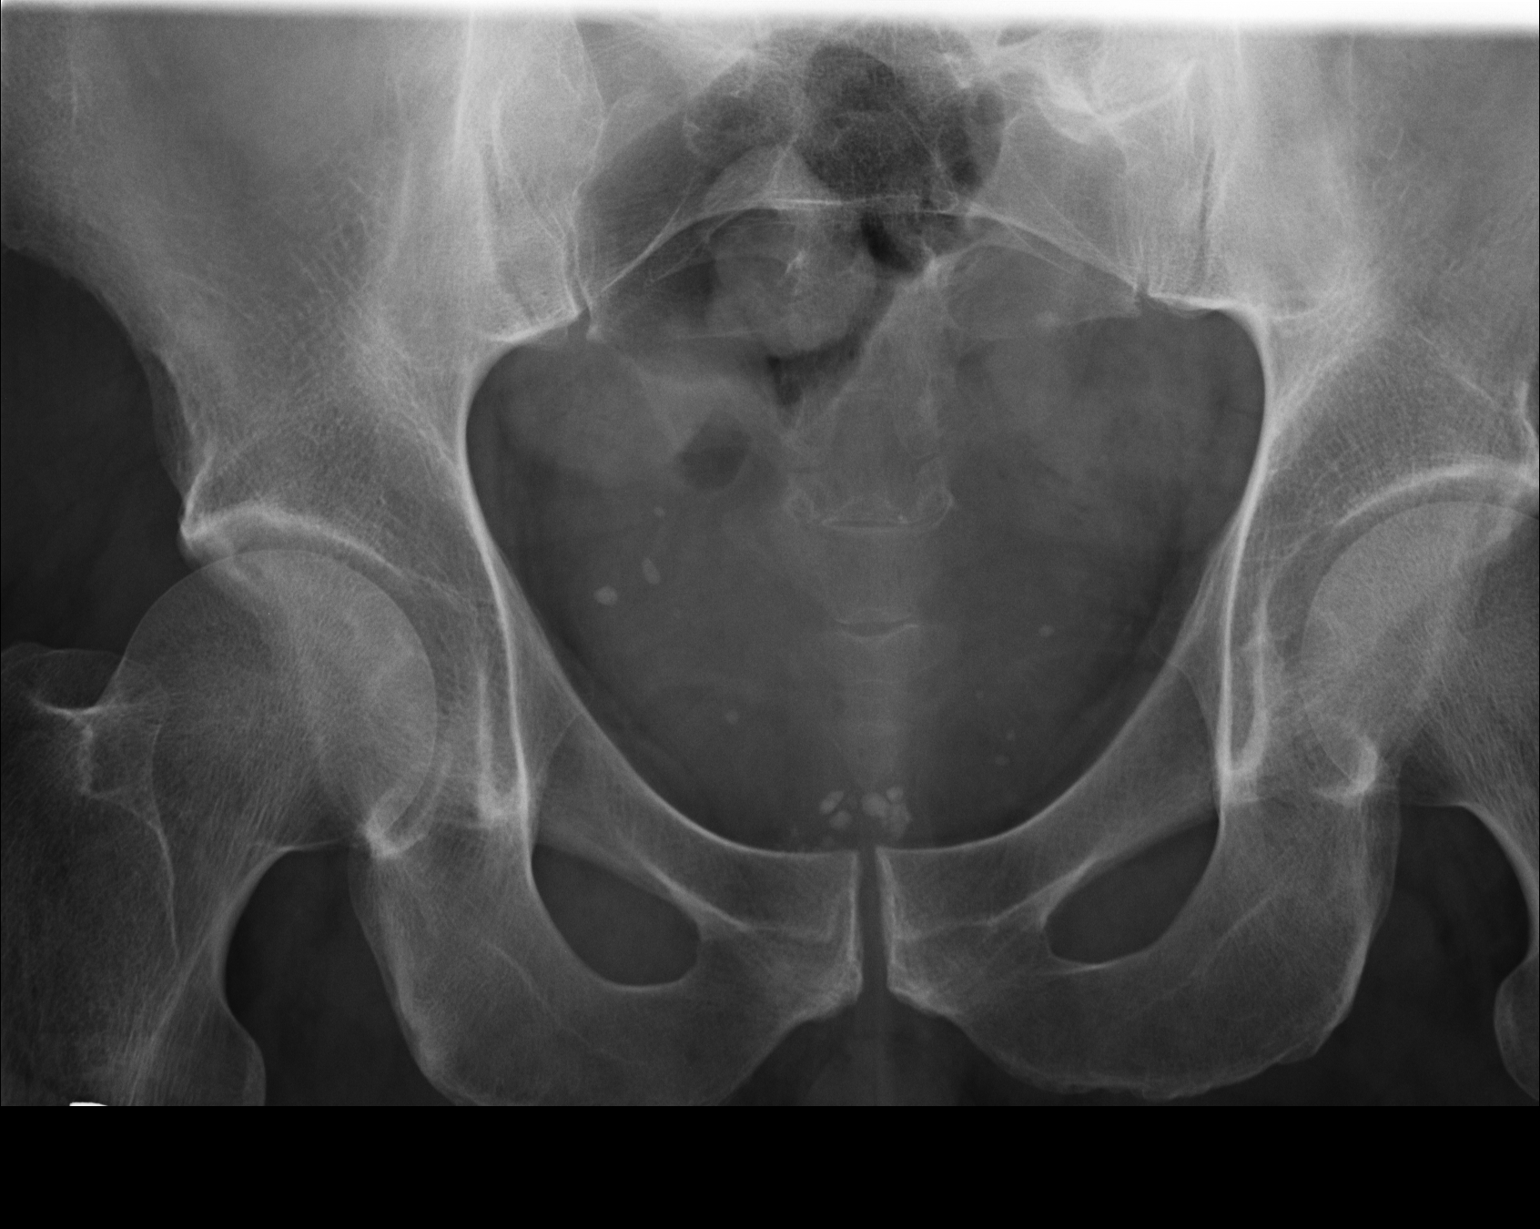

[3 of 3 positions shown; findings below may reference images not displayed]

PROCEDURE:     DXR - DXR KIDNEY URETER BLADDER  - October 29, 2006  [DATE]

RESULT:     Views of the abdomen demonstrate multiple calcific densities
including over the lower pole of the LEFT kidney and the lower pole region
of the RIGHT kidney. Multiple calcifications are seen over the pelvic
regions bilaterally and centrally. Ureteral or bladder stones could give a
similar appearance. The calcifications do not show significant change
compared to the prior image of 09/11/2006.
IMPRESSION: Please see above.

## 2009-08-22 IMAGING — CR DG ABDOMEN 1V
1 series · 2 of 2 positions shown · non-contrast
Comparison: none

REASON FOR EXAM: nephrolithiasis pt need films
COMMENTS:

[Series 1: view not recorded · 0.17mm/px · 2 of 2 slices shown]
[im 1/2]
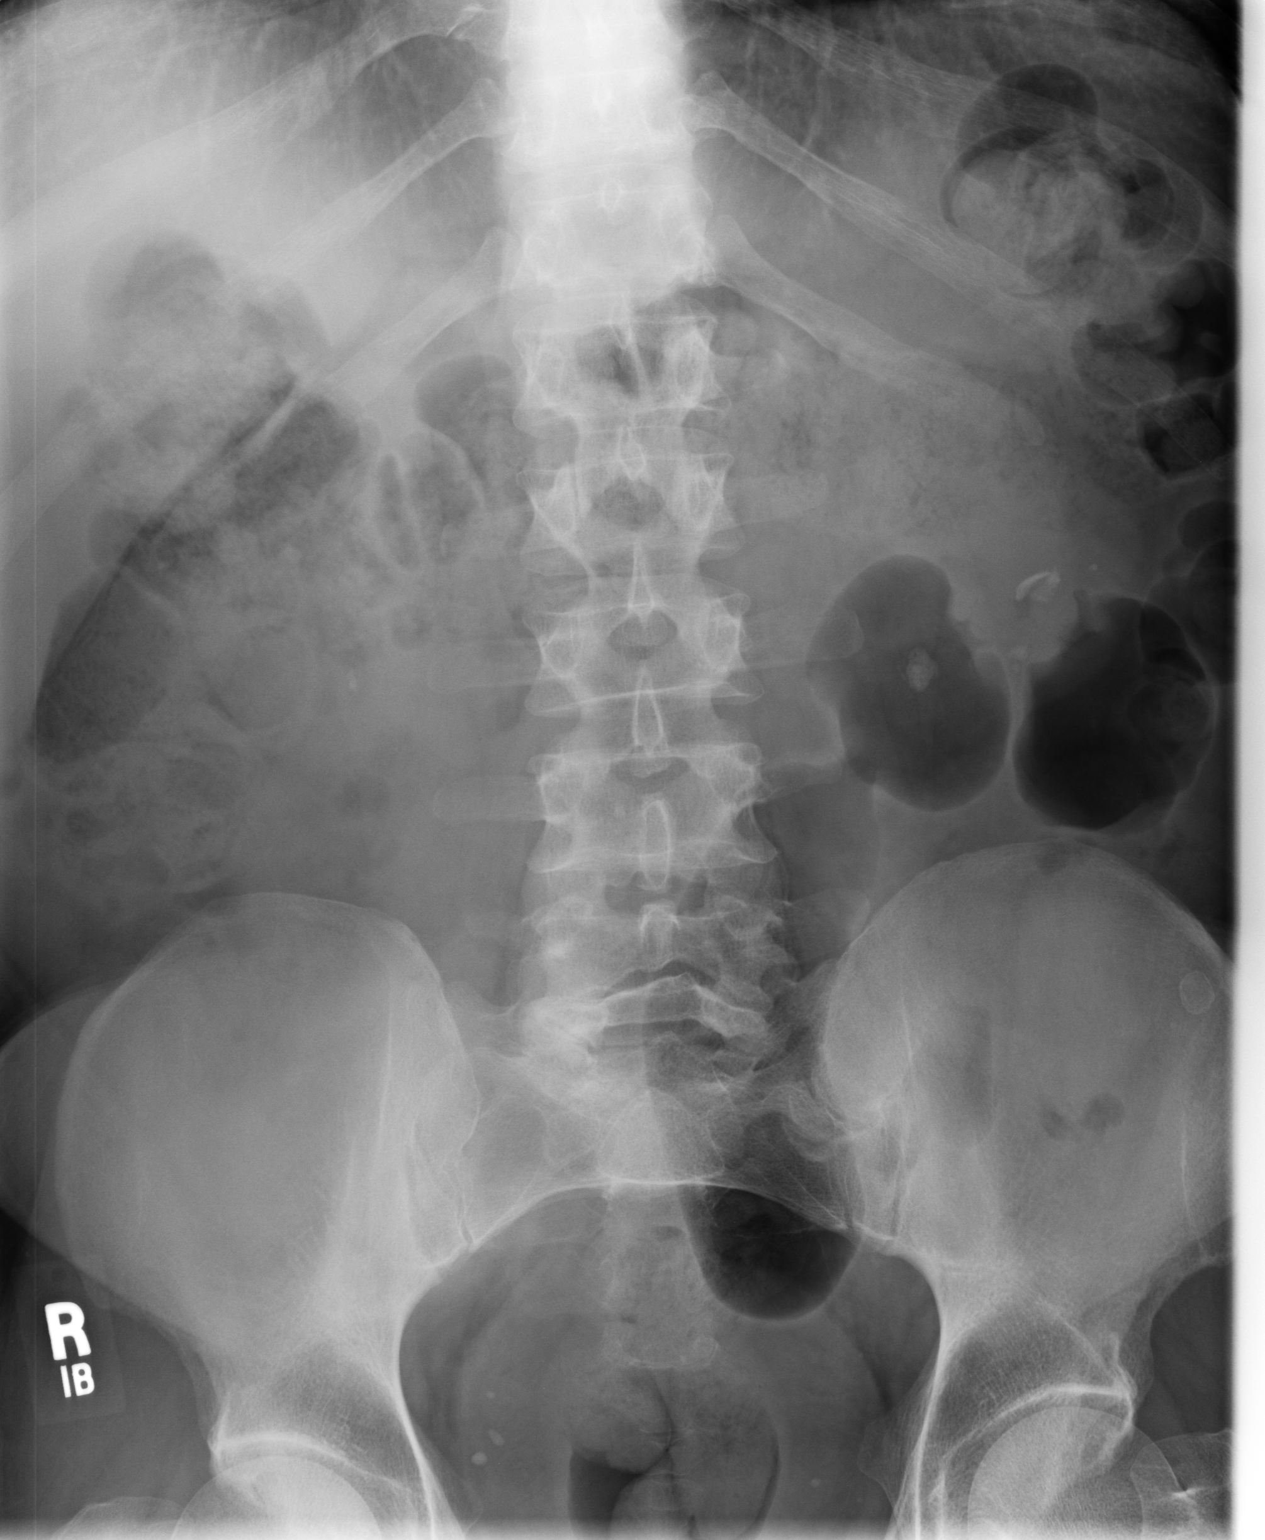
[im 2/2]
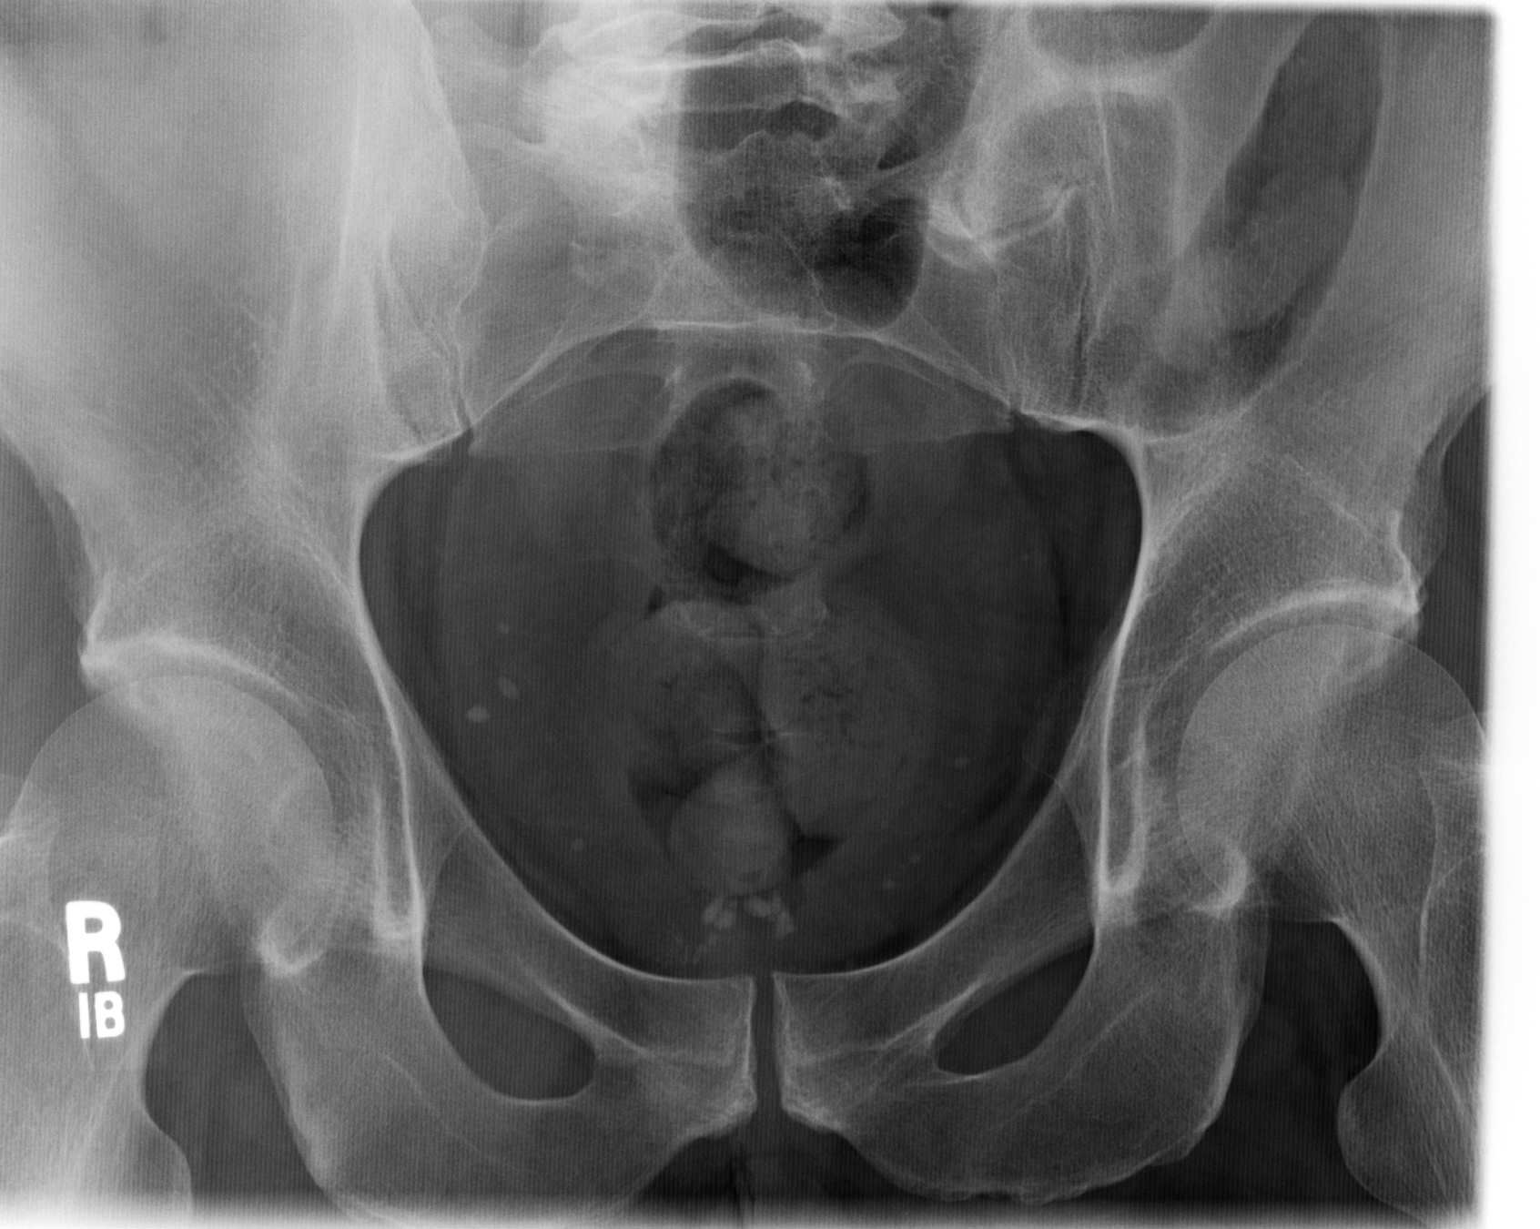

[2 of 2 positions shown; findings below may reference images not displayed]

PROCEDURE:     DXR - DXR KIDNEY URETER BLADDER  - January 28, 2007  [DATE]

RESULT:     Multiple calcifications are projected over the LEFT kidney
consistent with LEFT renal stones. The largest is projected over the medial
aspect of the kidney and measures approximately 1.2 cm at maximum diameter.
There is a faint 4 mm density projected over the lower pole of the RIGHT
kidney and suspicious for a RIGHT renal stone. This could be artifact since
the finding is not seen on the prior exam. There are multiple calcifications
in the pelvic area bilaterally compatible with phleboliths. No definite
ureteral calcifications are identified on either side.
IMPRESSION: 1.     Please see above.

## 2009-09-04 IMAGING — CT CT ABD-PELV W/O CM
1 of 2 series · 15 of 32 positions shown, 19 images · non-contrast
Comparison: none

REASON FOR EXAM: Nephrolithiasis
COMMENTS:

[Series 2: soft tissue · axial · 0.73mm/px · z∈[-880,-490]mm · 15 of 147 slices shown, 19 images]
[im 11/147  soft-tissue]
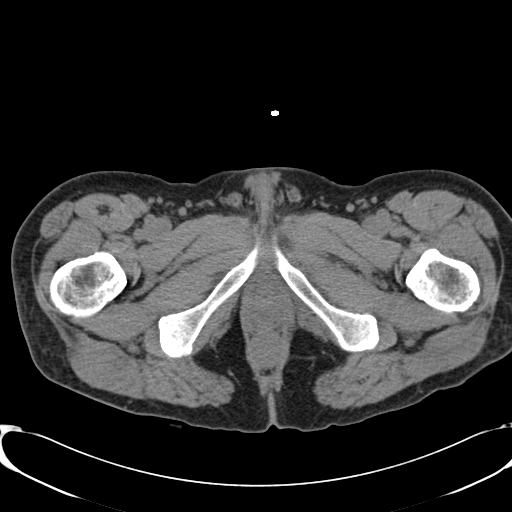
[im 11/147  bone]
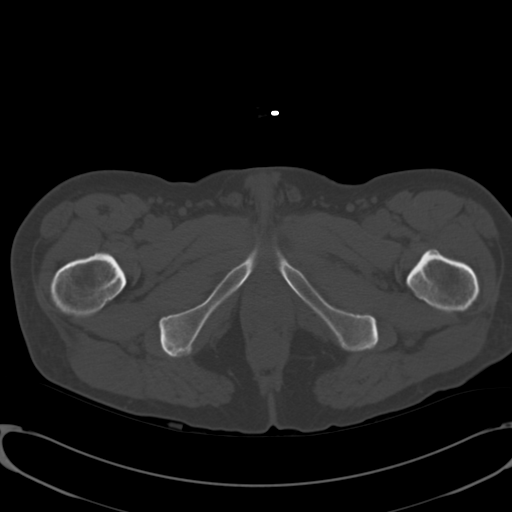
[im 21/147  soft-tissue]
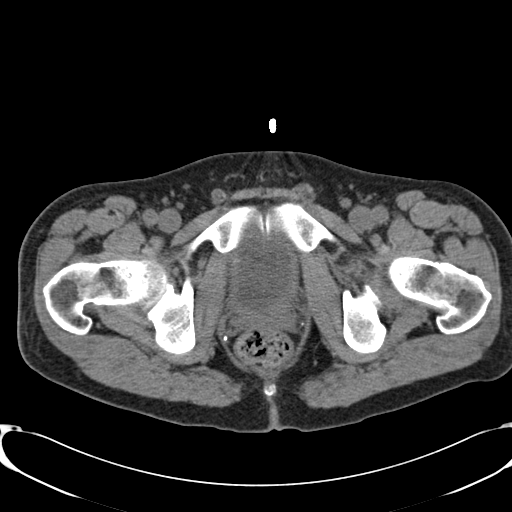
[im 32/147  soft-tissue]
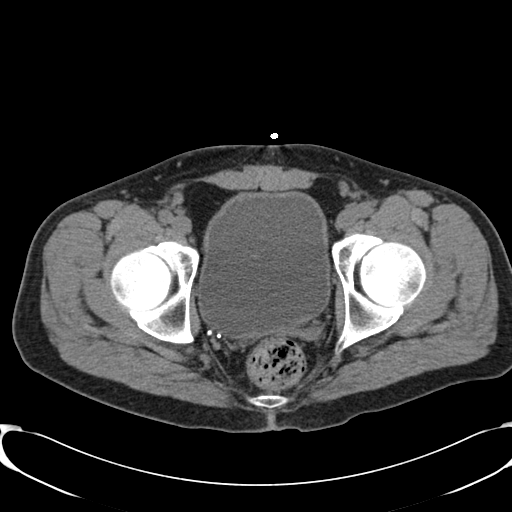
[im 42/147  soft-tissue]
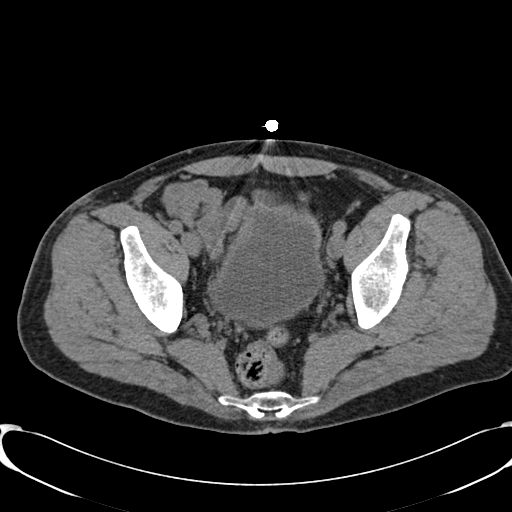
[im 53/147  soft-tissue]
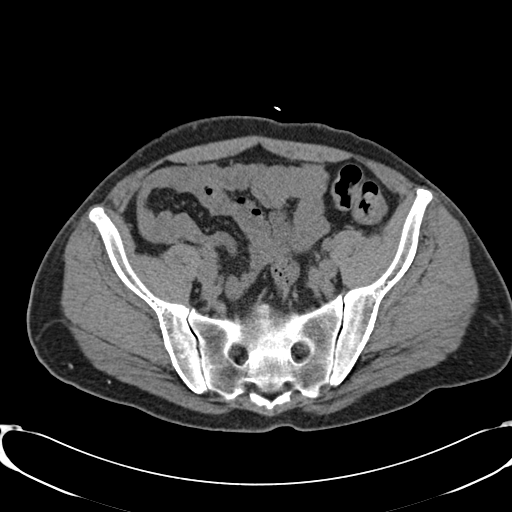
[im 63/147  soft-tissue]
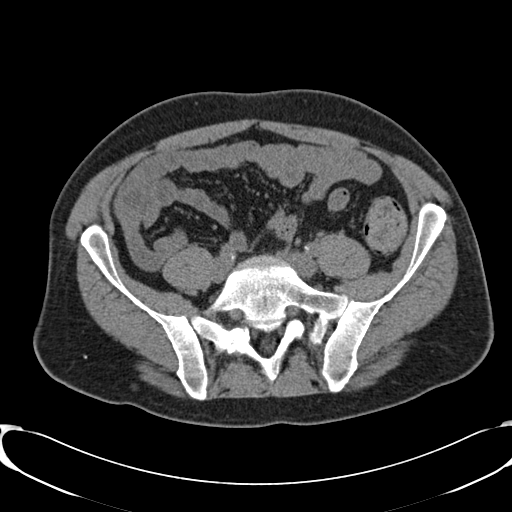
[im 74/147  soft-tissue]
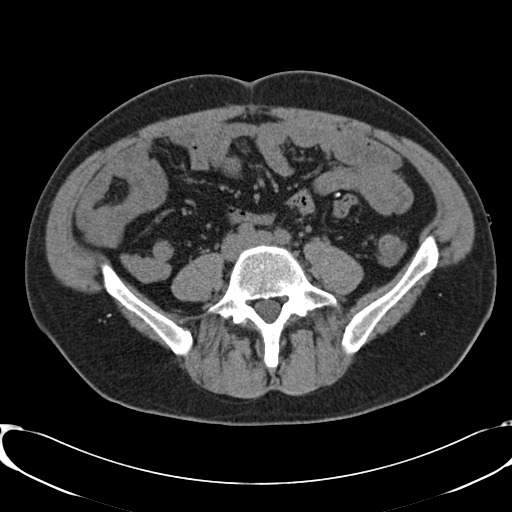
[im 84/147  soft-tissue]
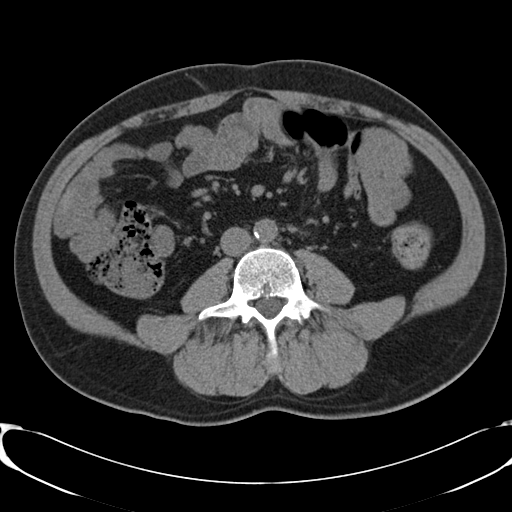
[im 94/147  soft-tissue]
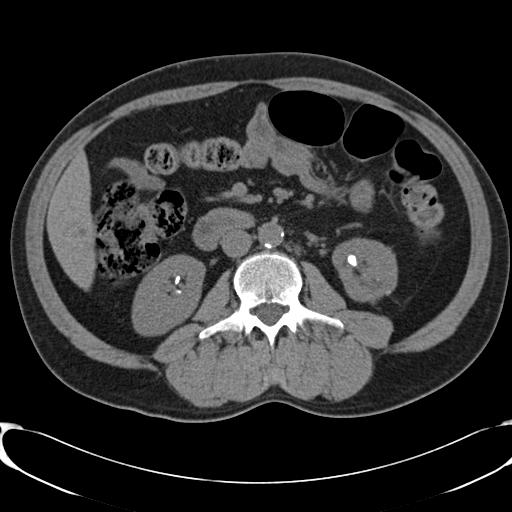
[im 94/147  bone]
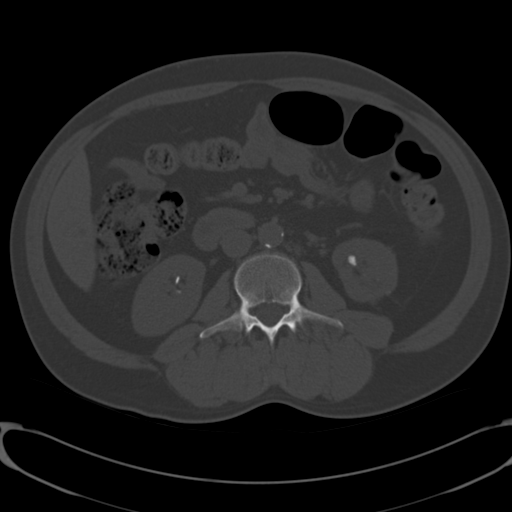
[im 105/147  soft-tissue]
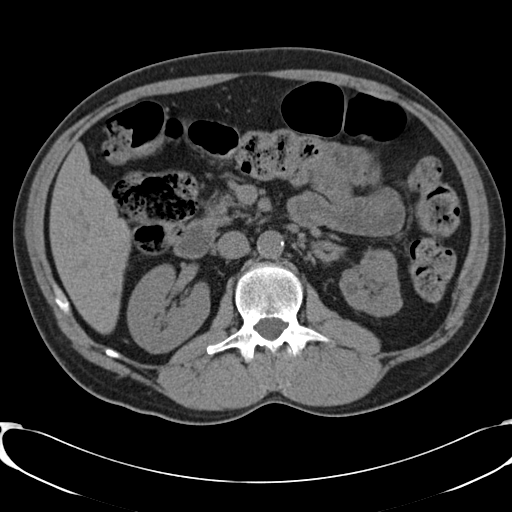
[im 115/147  soft-tissue]
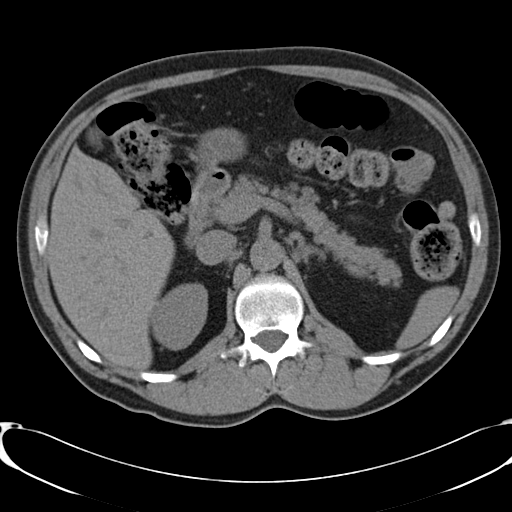
[im 126/147  soft-tissue]
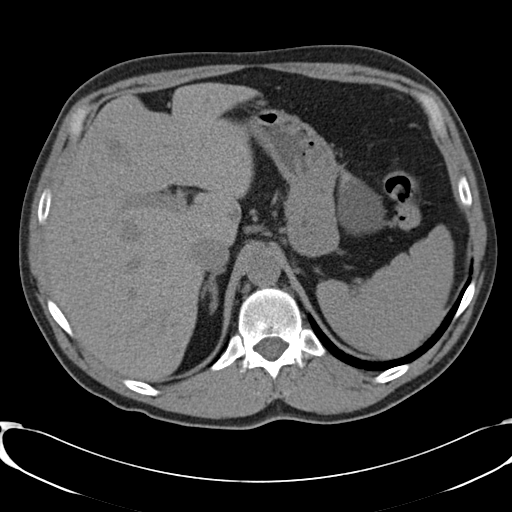
[im 126/147  lung]
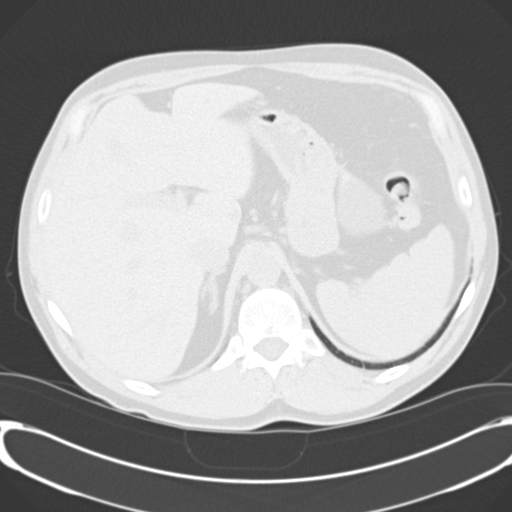
[im 131/147  lung]
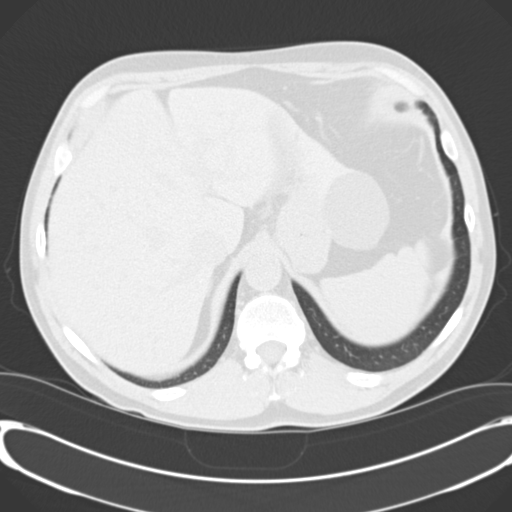
[im 136/147  soft-tissue]
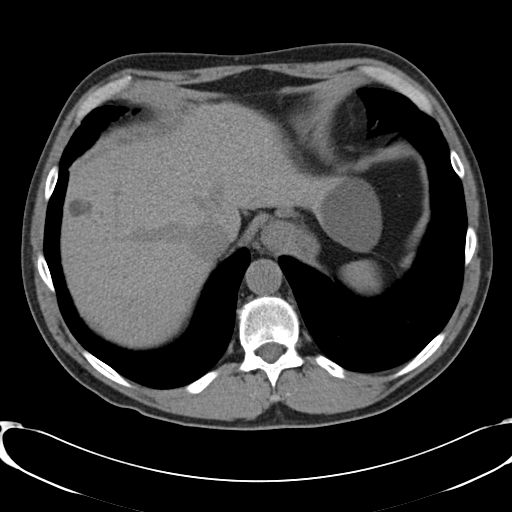
[im 136/147  lung]
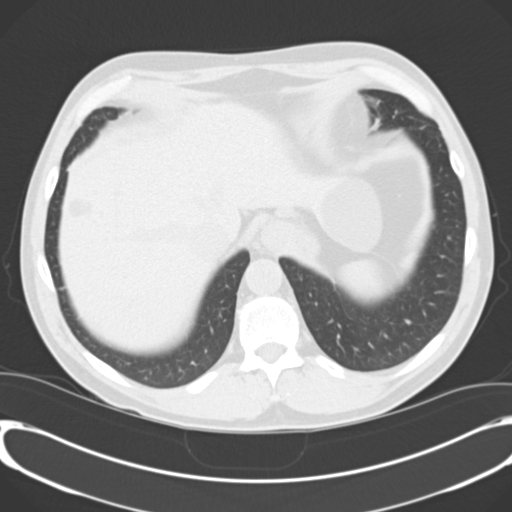
[im 141/147  lung]
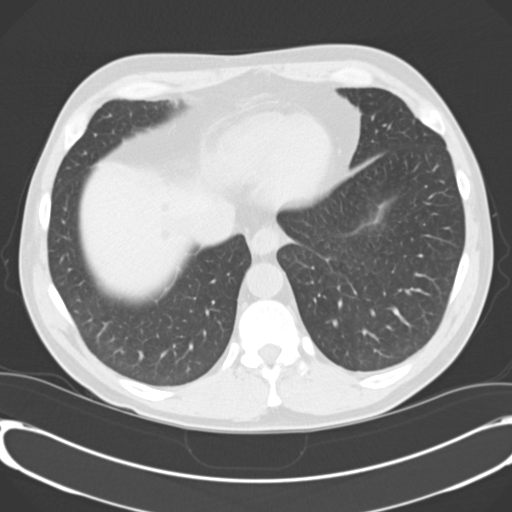

[15 of 32 positions shown; findings below may reference images not displayed]

PROCEDURE:     CT  - CT ABDOMEN AND PELVIS W[DATE]  [DATE]

RESULT:     There are calcifications present in the mid and lower pole of
the LEFT kidney. These appear to lie within collecting systems as well as
within the parenchyma. When compared to the study dated 01/09/2006, these
have not appreciably changed. On the RIGHT, there are calcifications that
likely reflect non-obstructing stones in lower pole calices. The urinary
bladder is partially distended and grossly normal. There are phleboliths
within the pelvis. There is prostatic calcification. The unopacified loops
of small and large bowel are normal in appearance. The liver demonstrates
numerous hypo densities compatible with cysts. These have been demonstrated
in the past. The largest is associated with the LEFT lobe and measures
approximately 5.9 cm in diameter. There is no intrahepatic ductal dilation.
The gallbladder, spleen, adrenal glands, pancreas and partially distended
stomach are normal in appearance. The caliber of the abdominal aorta is
normal. The lung bases are clear.

The lumbar vertebral bodies are preserved in height.
IMPRESSION: 1.  There are bilateral urinary tract stones without objective evidence of
obstruction. The largest stone burden is on the LEFT.
2.  There are hypodensities within the liver most compatible with cysts.
3.  I see no acute abnormality elsewhere within the abdomen or pelvis.

## 2009-09-26 IMAGING — CR DG ABDOMEN 1V
1 series · 1 of 1 positions shown · non-contrast
Comparison: none

REASON FOR EXAM: Renal calculi-lithotripsy
COMMENTS:

[view not recorded]
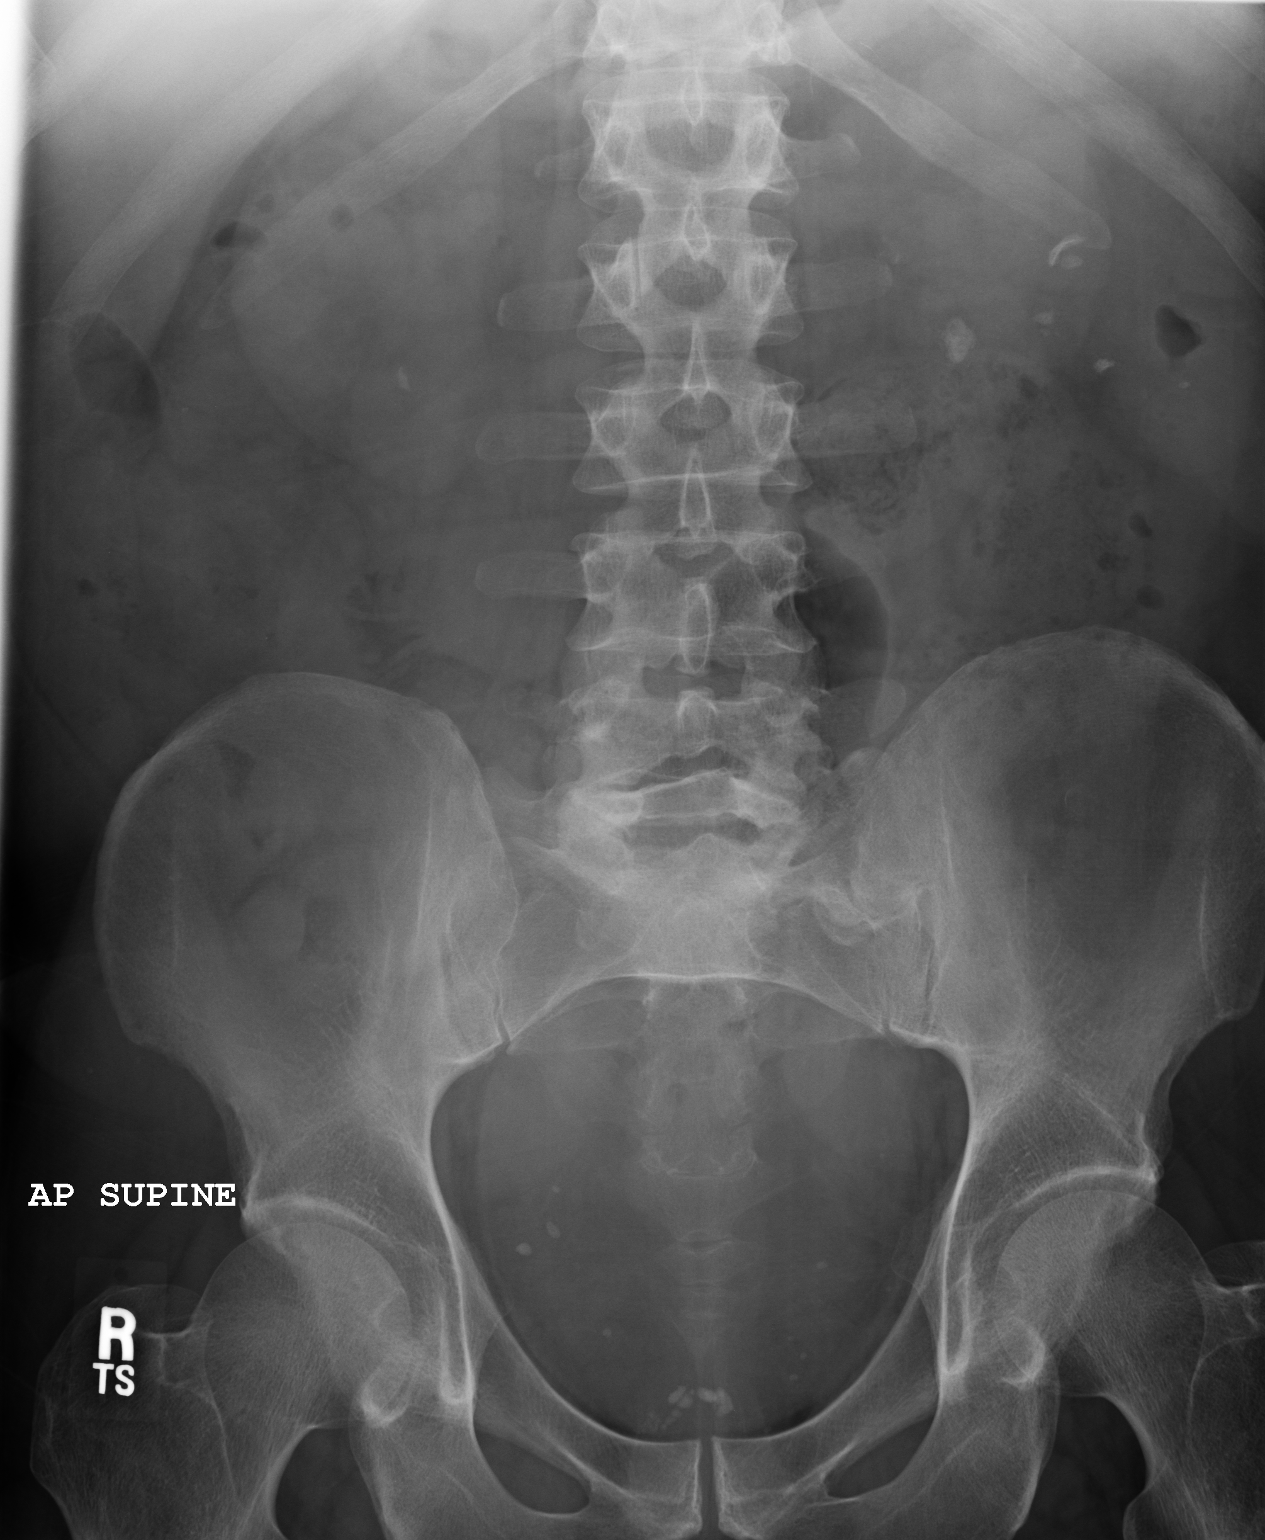

[1 of 1 positions shown; findings below may reference images not displayed]

PROCEDURE:     DXR - DXR KIDNEY URETER BLADDER  - March 04, 2007  [DATE]

RESULT:     There are bilateral densities consistent with nephrolithiasis.
The largest is in the area of the lower pole of the LEFT kidney. Density is
present over the lower pole of the RIGHT kidney as well. Additional calcific
densities appear to be extrinsic to the kidney in the LEFT abdomen.
Densities are present in the pelvic region bilaterally. The pattern of these
is unchanged as compared to 01/28/2007. Prostatic calcification appears to
be present.
IMPRESSION: Bilateral nephrolithiasis. If there is concern for distal
ureteral stones, then CT or IVP would be suggested.

## 2009-10-07 IMAGING — CR DG ABDOMEN 1V
1 series · 2 of 2 positions shown · non-contrast
Comparison: none

REASON FOR EXAM: Nephrolithiasis
COMMENTS:

[Series 1: view not recorded · 0.17mm/px · 2 of 2 slices shown]
[im 1/2]
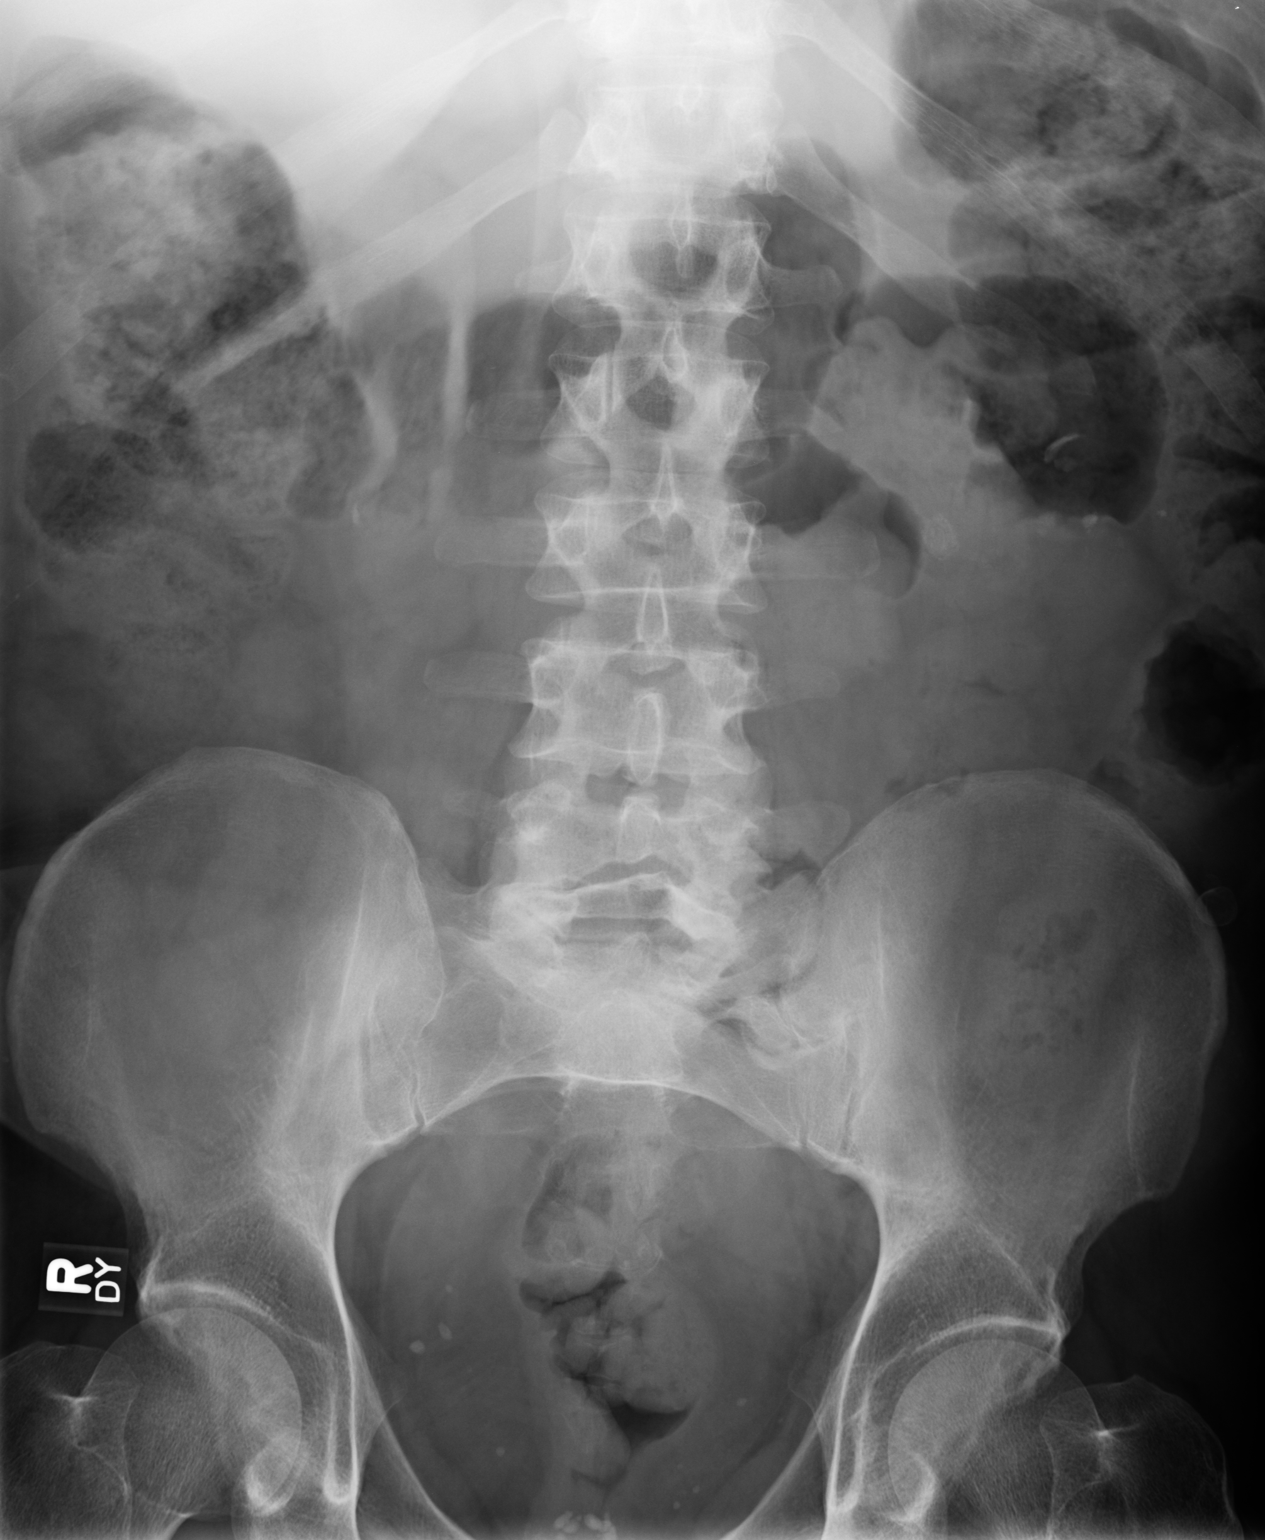
[im 2/2]
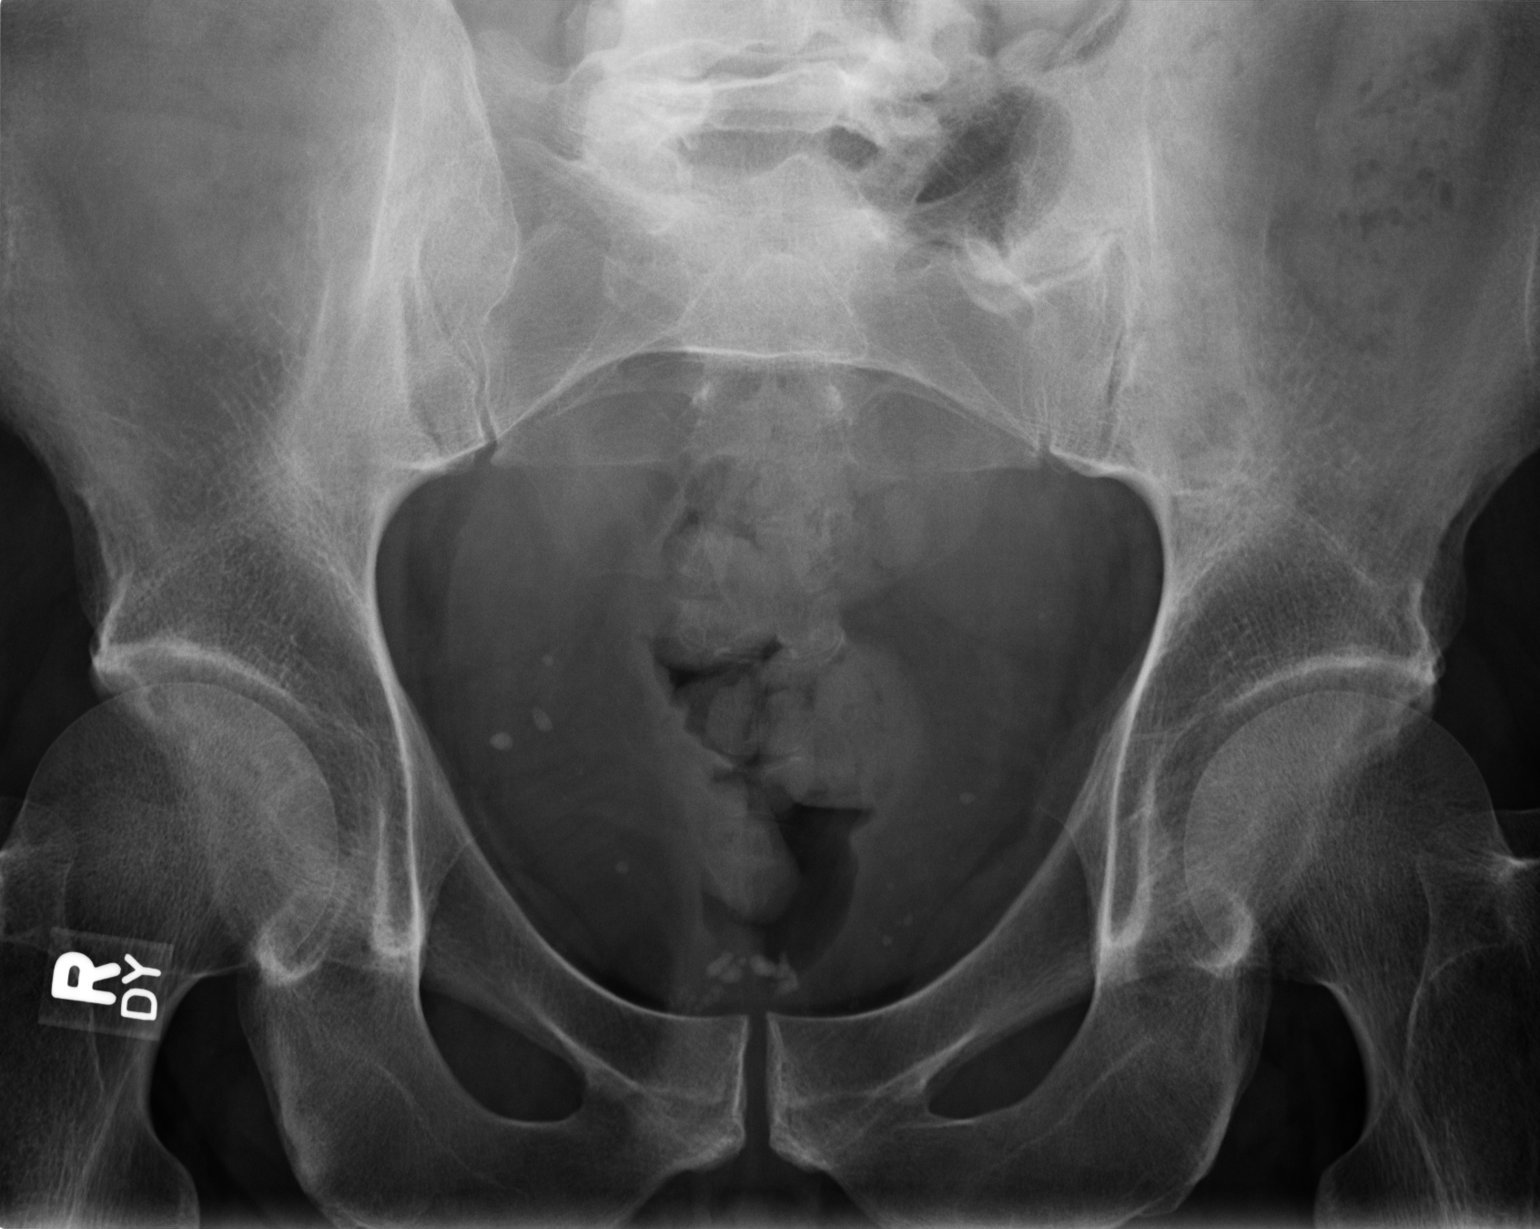

[2 of 2 positions shown; findings below may reference images not displayed]

PROCEDURE:     DXR - DXR KIDNEY URETER BLADDER  - March 15, 2007 [DATE]

RESULT:     Comparison is made to the prior exam of 03/04/2007.  Multiple
calcifications are again noted in the midpole region of the LEFT kidney.
Also noted is a 2 mm x 4 mm calcification projected over the lower pole of
the RIGHT kidney. No definite ureteral stones are seen. Phleboliths are seen
in the pelvis bilaterally. There is noted calcification in the region of the
prostate gland.
IMPRESSION: 1. Bilateral nephrolithiasis.
2. There is a small amount of calcification observed in the prostate.

## 2009-11-04 IMAGING — CR DG ABDOMEN 1V
1 series · 2 of 2 positions shown · non-contrast
Comparison: none

REASON FOR EXAM: nephrolithiasis pt need films
COMMENTS:

[Series 1: view not recorded · 0.17mm/px · 2 of 2 slices shown]
[im 1/2]
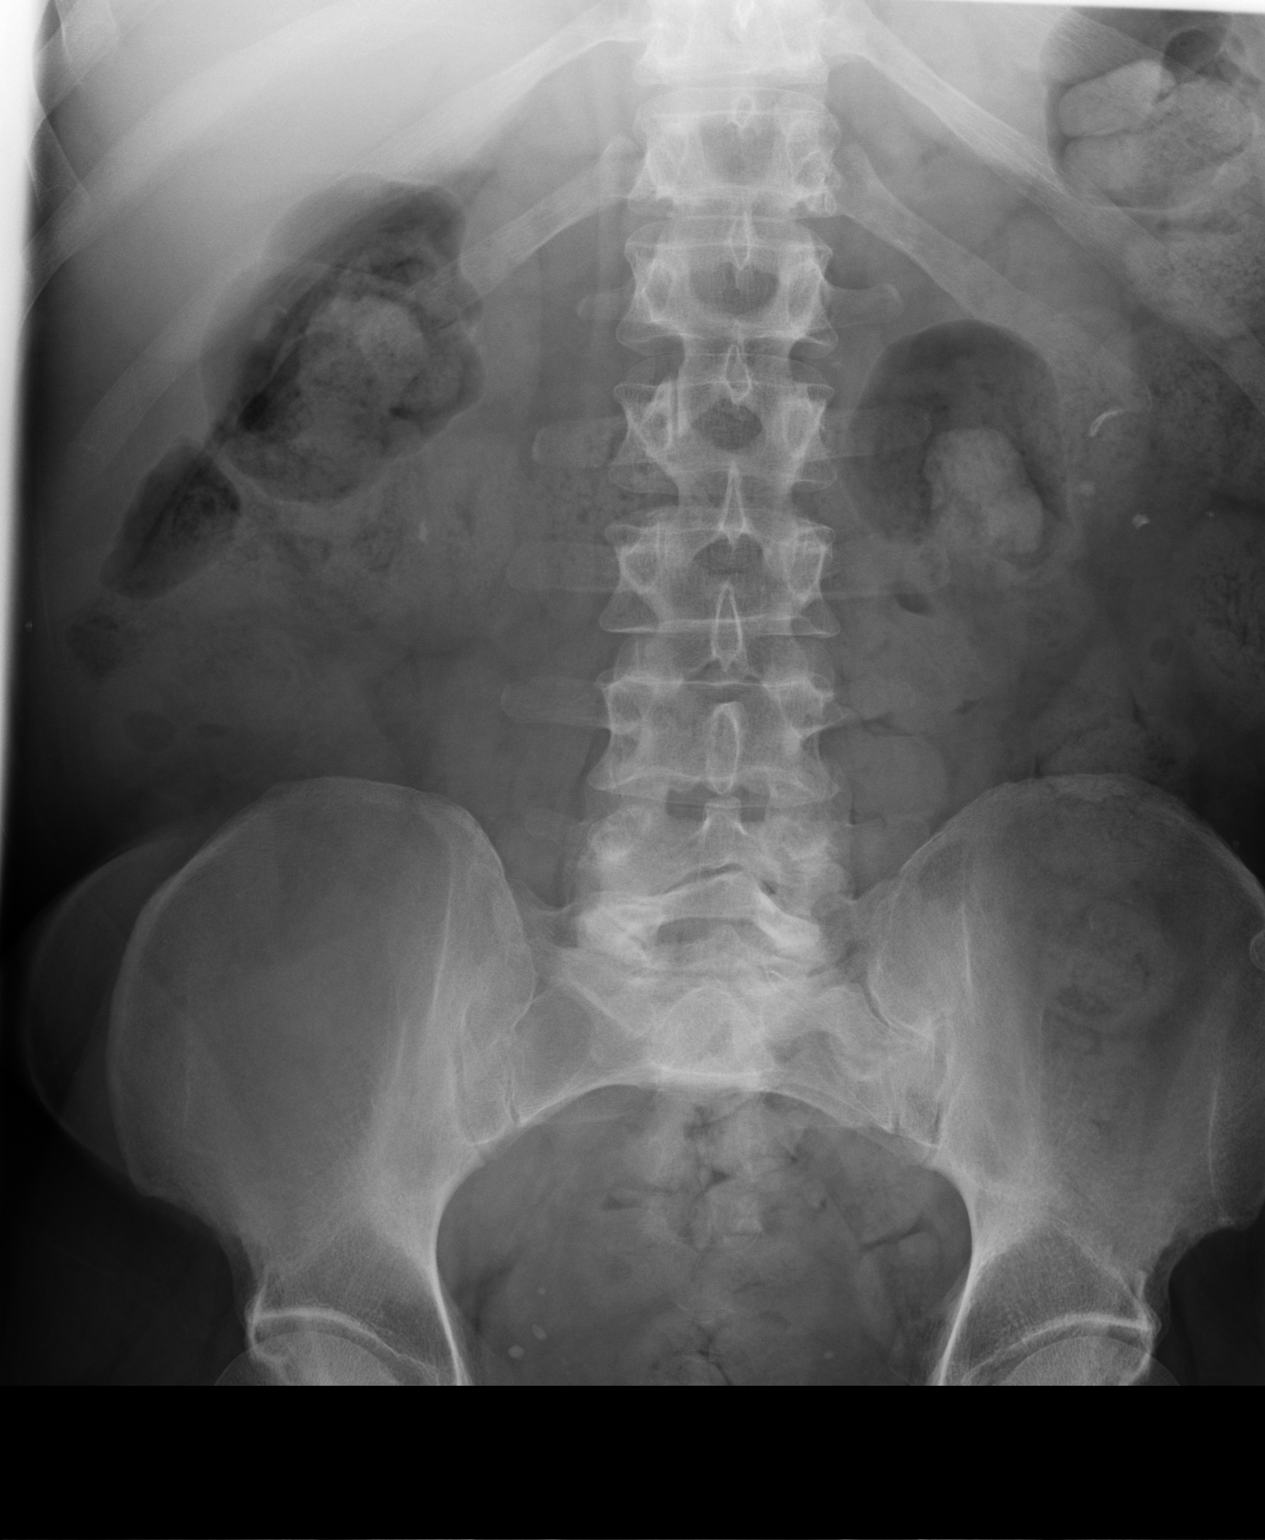
[im 2/2]
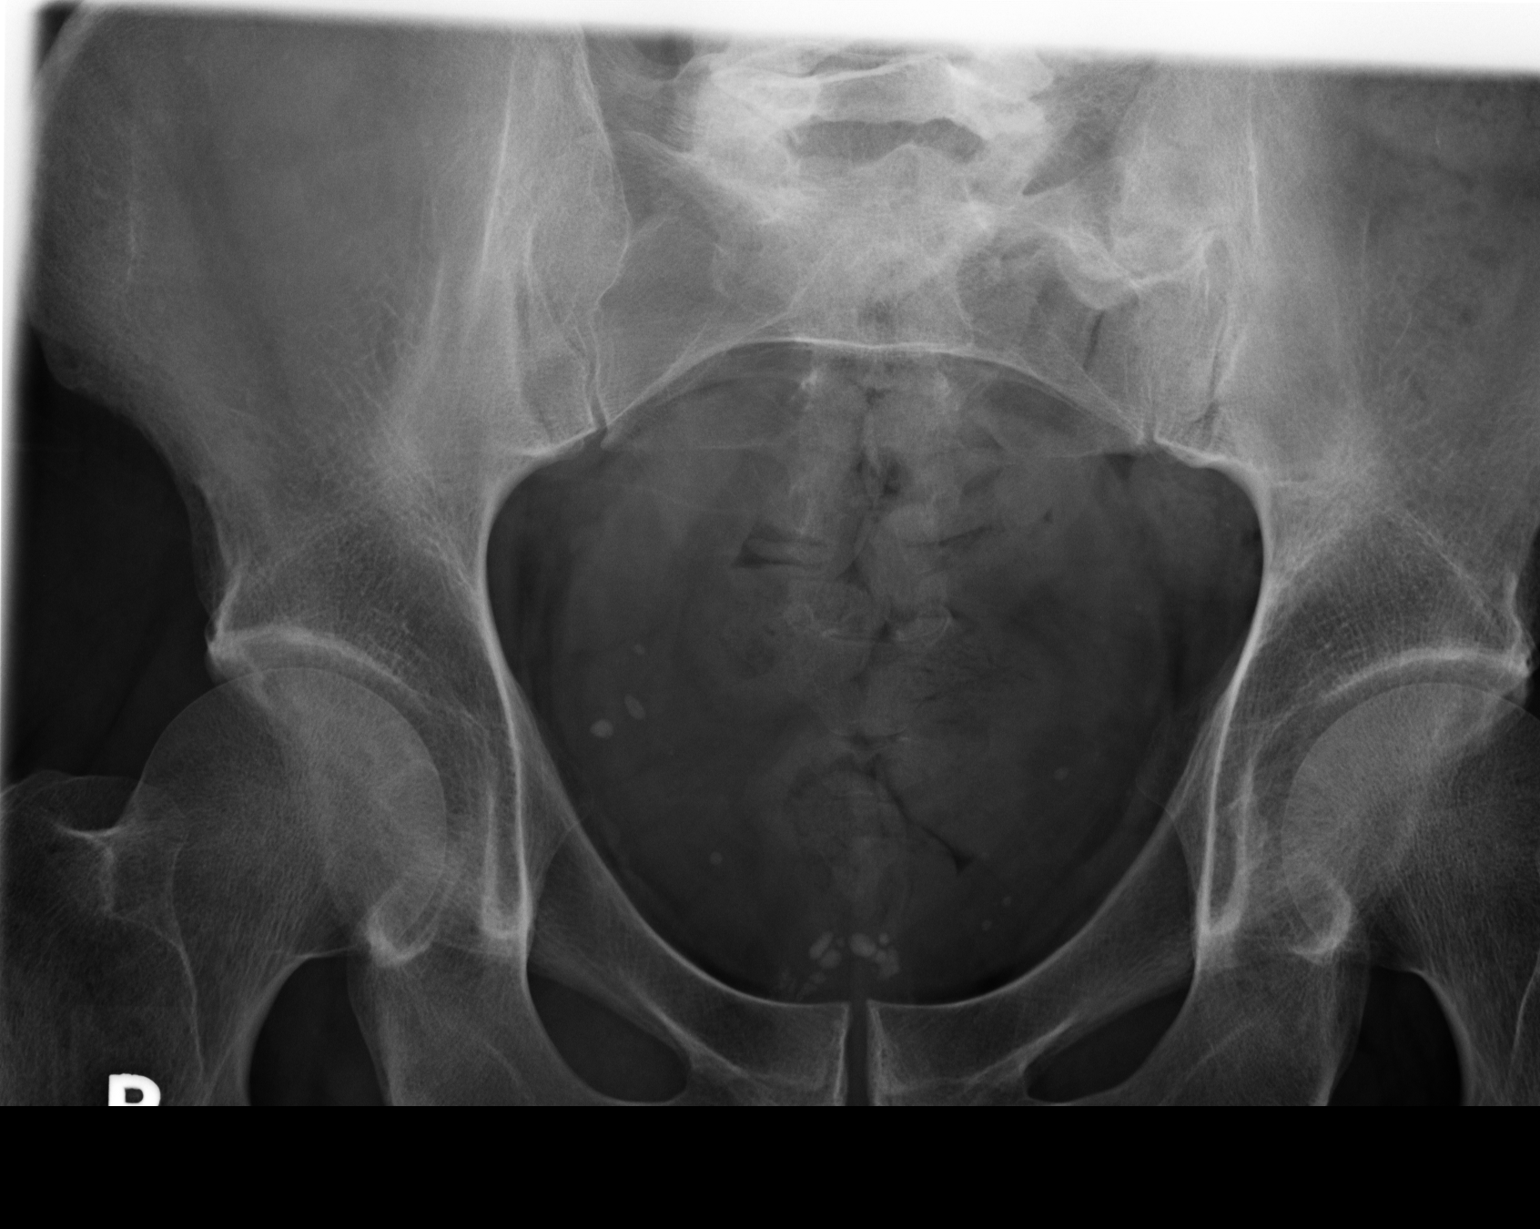

[2 of 2 positions shown; findings below may reference images not displayed]

PROCEDURE:     DXR - DXR KIDNEY URETER BLADDER  - April 12, 2007  [DATE]

RESULT:     Comparison is made to the study of 03/15/2007. There appear to
be densities over both kidneys consistent with urinary tract stones. Pelvic
densities are seen which appear to be stable compared to the prior study and
suggestive of phleboliths and prostate calcification. The possibility of a
ureteral stone could be better evaluated with CT.
IMPRESSION: Bilateral nephrolithiasis.

## 2010-01-30 IMAGING — CR DG ABDOMEN 1V
1 series · 1 of 1 positions shown · non-contrast
Comparison: none

REASON FOR EXAM: nephrolitiasis  please give film to patient
COMMENTS:

[view not recorded]
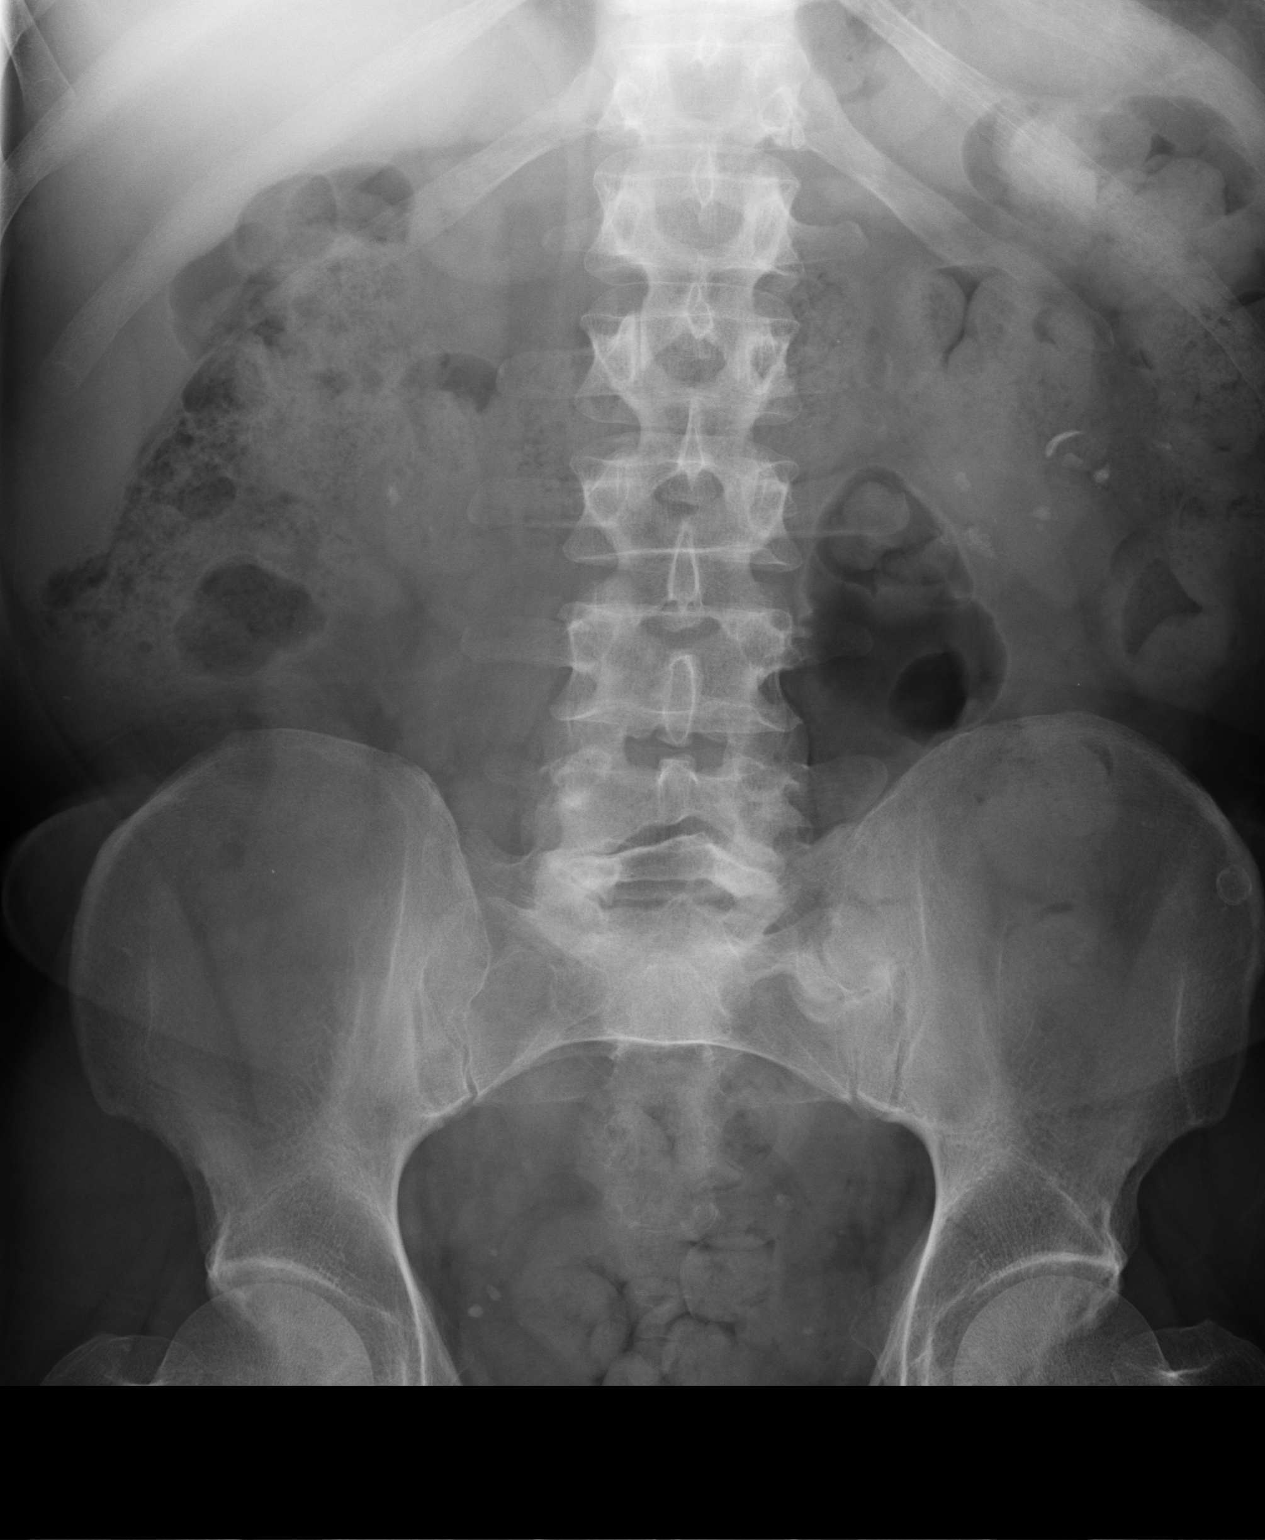

[1 of 1 positions shown; findings below may reference images not displayed]

PROCEDURE:     DXR - DXR KIDNEY URETER BLADDER  - July 08, 2007  [DATE]

RESULT:     Comparison is made to the exam dated 04/12/2007. There are
bilateral calcific densities over the kidney regions consistent with
bilateral urinary tract calculi, more numerous on the LEFT than on the
RIGHT. There are multiple phleboliths in the pelvic region. Prostate
calcification appears to be present. The bowel gas pattern is unremarkable.
IMPRESSION: Urinary calculi as described. If there is concern for a ureteral stone on
the LEFT, CT or IVP could be performed.

## 2010-02-27 ENCOUNTER — Inpatient Hospital Stay: Payer: Self-pay | Admitting: Internal Medicine

## 2010-03-07 LAB — PATHOLOGY REPORT

## 2010-03-12 ENCOUNTER — Emergency Department: Payer: Self-pay | Admitting: Emergency Medicine

## 2010-03-27 ENCOUNTER — Emergency Department: Payer: Self-pay | Admitting: Emergency Medicine

## 2010-04-23 ENCOUNTER — Ambulatory Visit: Payer: Self-pay | Admitting: Unknown Physician Specialty

## 2010-06-12 IMAGING — CR DG ABDOMEN 1V
1 series · 1 of 1 positions shown · non-contrast
Comparison: none

REASON FOR EXAM: NEPHROLITHIASIS
COMMENTS:

[view not recorded]
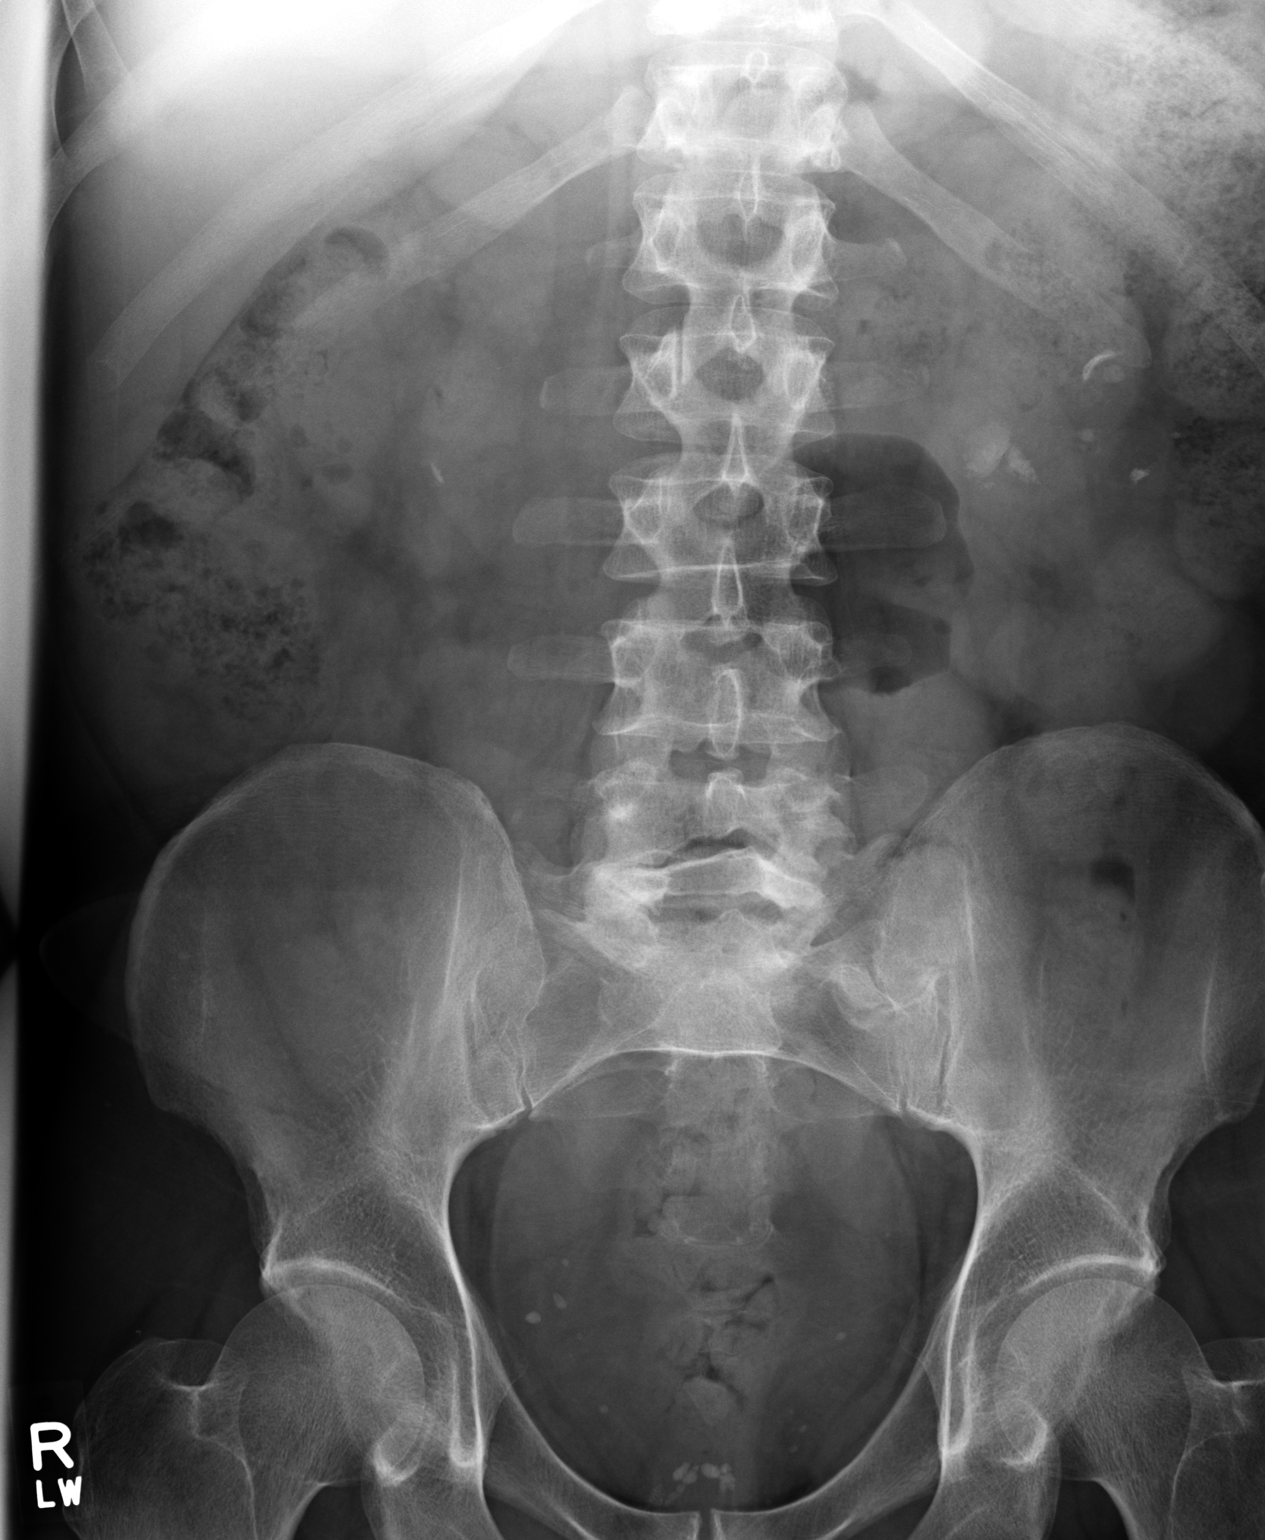

[1 of 1 positions shown; findings below may reference images not displayed]

PROCEDURE:     DXR - DXR KIDNEY URETER BLADDER  - November 18, 2007  [DATE]

RESULT:     Comparison is made to a prior exam of 07/08/2007.

There are again noted multiple calcifications projected over the lower pole
of the LEFT kidney. No definite LEFT ureteral calcifications are seen. On
the RIGHT, there is noted a calcification at the lower pole compatible with
RIGHT nephrolithiasis.
IMPRESSION: 1.  Bilateral nephrolithiasis.
2.  No definite ureteral calcifications are seen.
3.  There is a small amount of calcification noted in the prostate gland.

## 2010-06-19 IMAGING — CR DG ABDOMEN 1V
1 series · 2 of 2 positions shown · non-contrast
Comparison: none

REASON FOR EXAM: flank pain pre eswl
COMMENTS:

[Series 1: view not recorded · 0.17mm/px · 2 of 2 slices shown]
[im 1/2]
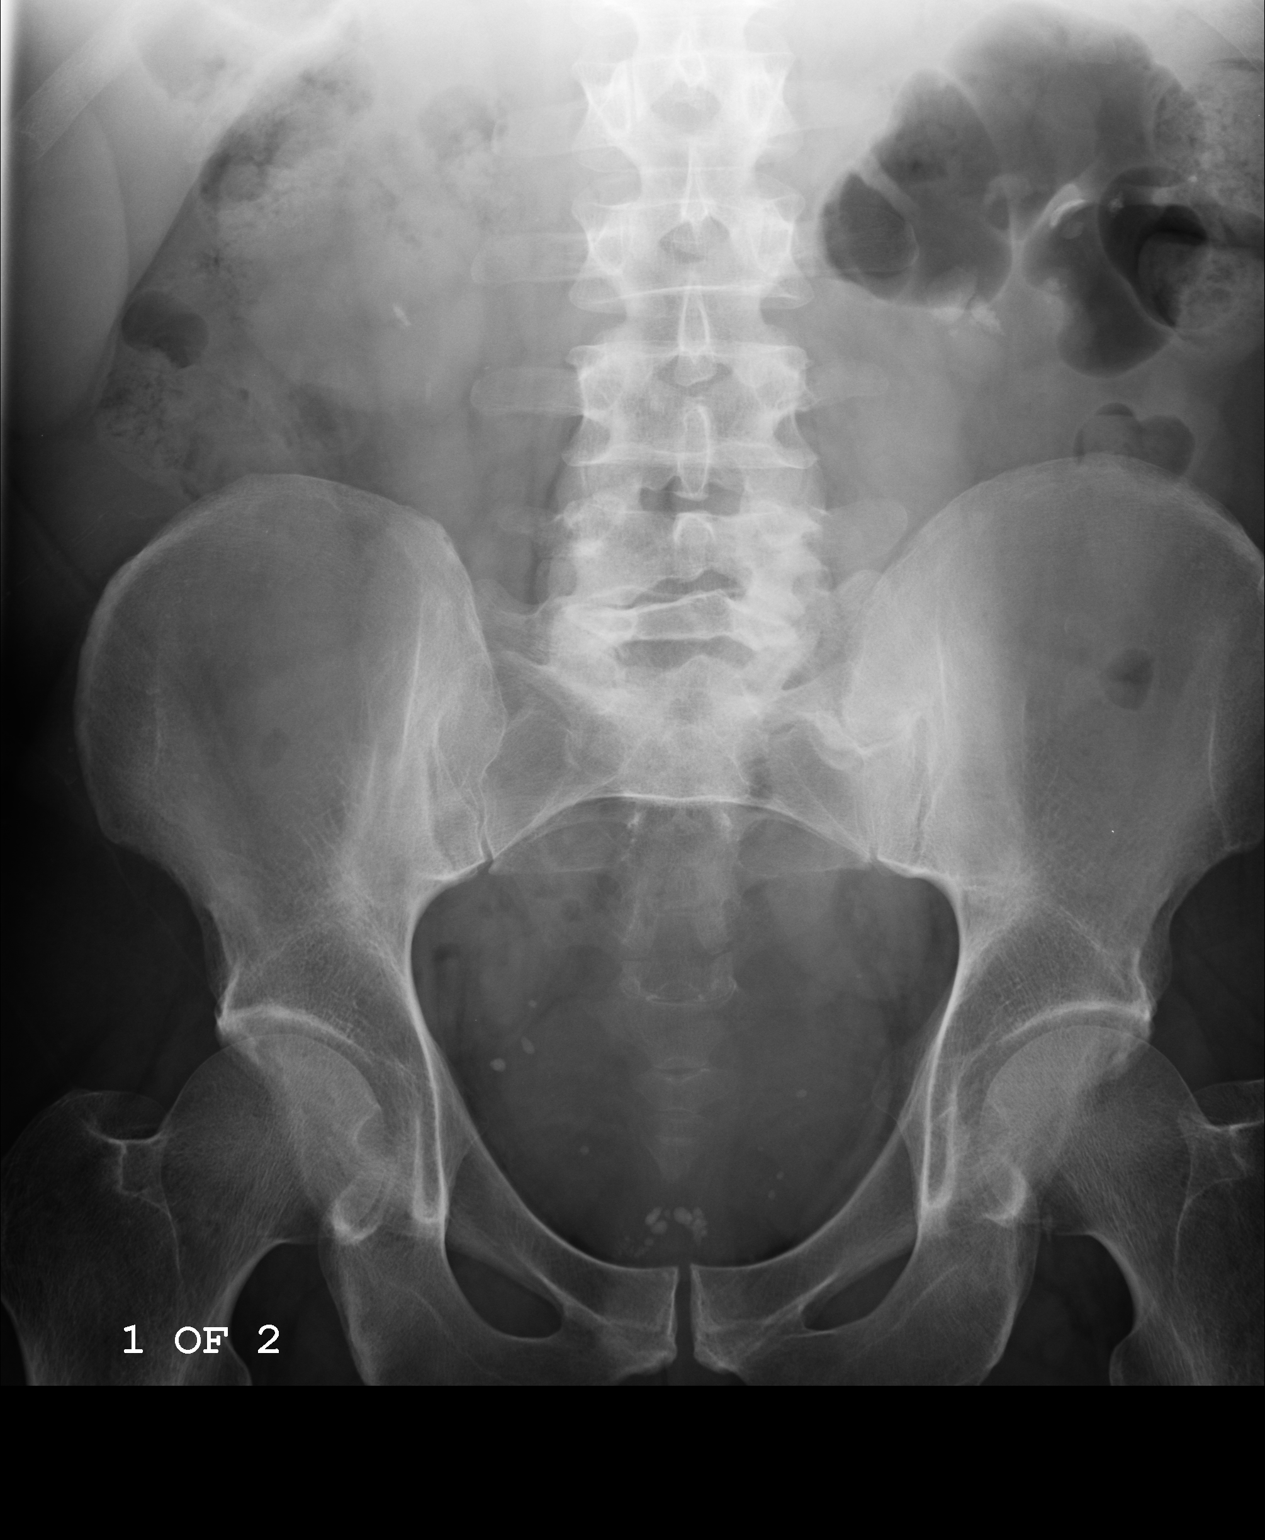
[im 2/2]
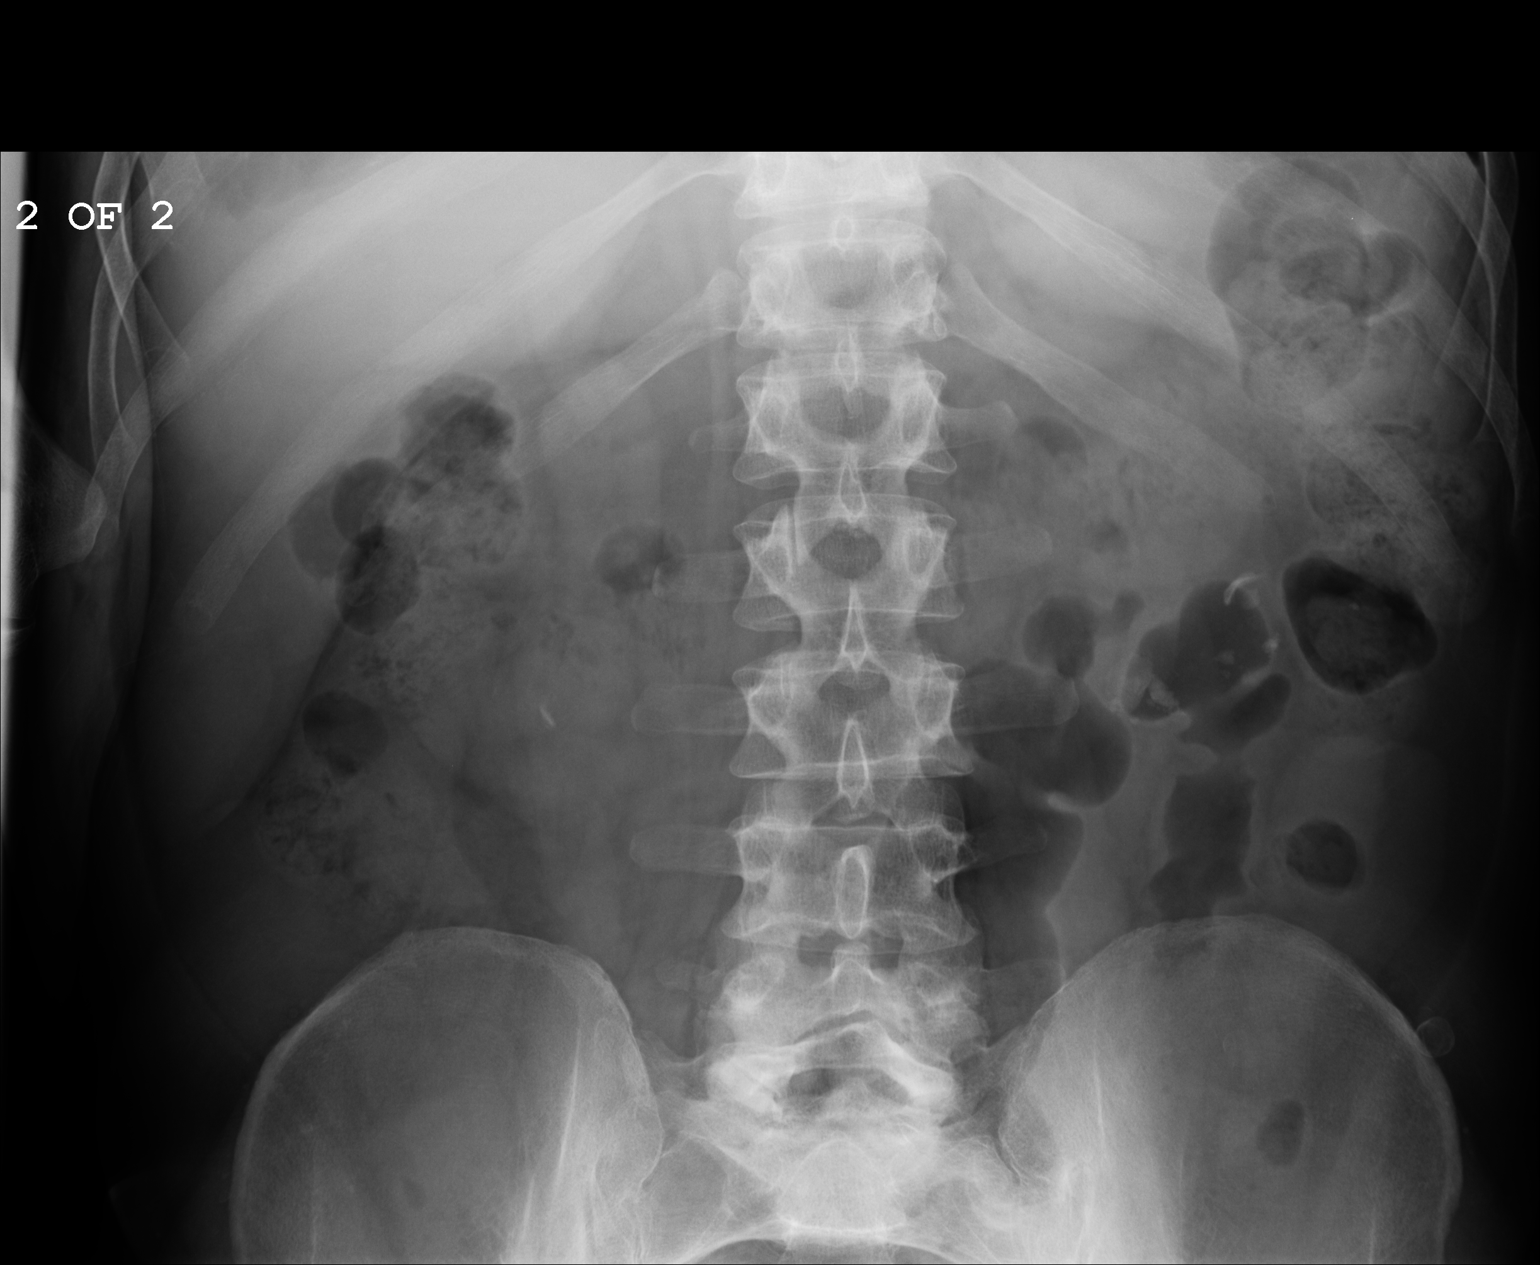

[2 of 2 positions shown; findings below may reference images not displayed]

PROCEDURE:     DXR - DXR KIDNEY URETER BLADDER  - November 25, 2007 [DATE]

RESULT:     Comparison is made to a prior exam of 11/17/2088.  Multiple lower
pole LEFT renal stones are again noted. The largest measures approximately
1.6 cm at maximum diameter. Also noted is a 7 mm lower pole renal stone on
the RIGHT. No definite ureteral stones are seen. Phleboliths are noted in
the pelvis bilaterally. Calcification is seen in the prostate.
IMPRESSION: 1.     Bilateral nephrolithiasis.

## 2010-08-07 IMAGING — CR DG ABDOMEN 1V
1 series · 3 of 3 positions shown · non-contrast
Comparison: none

REASON FOR EXAM: NEPHROLITHIASIS
COMMENTS:

[Series 1: view not recorded · 0.17mm/px · 3 of 3 slices shown]
[im 1/3]
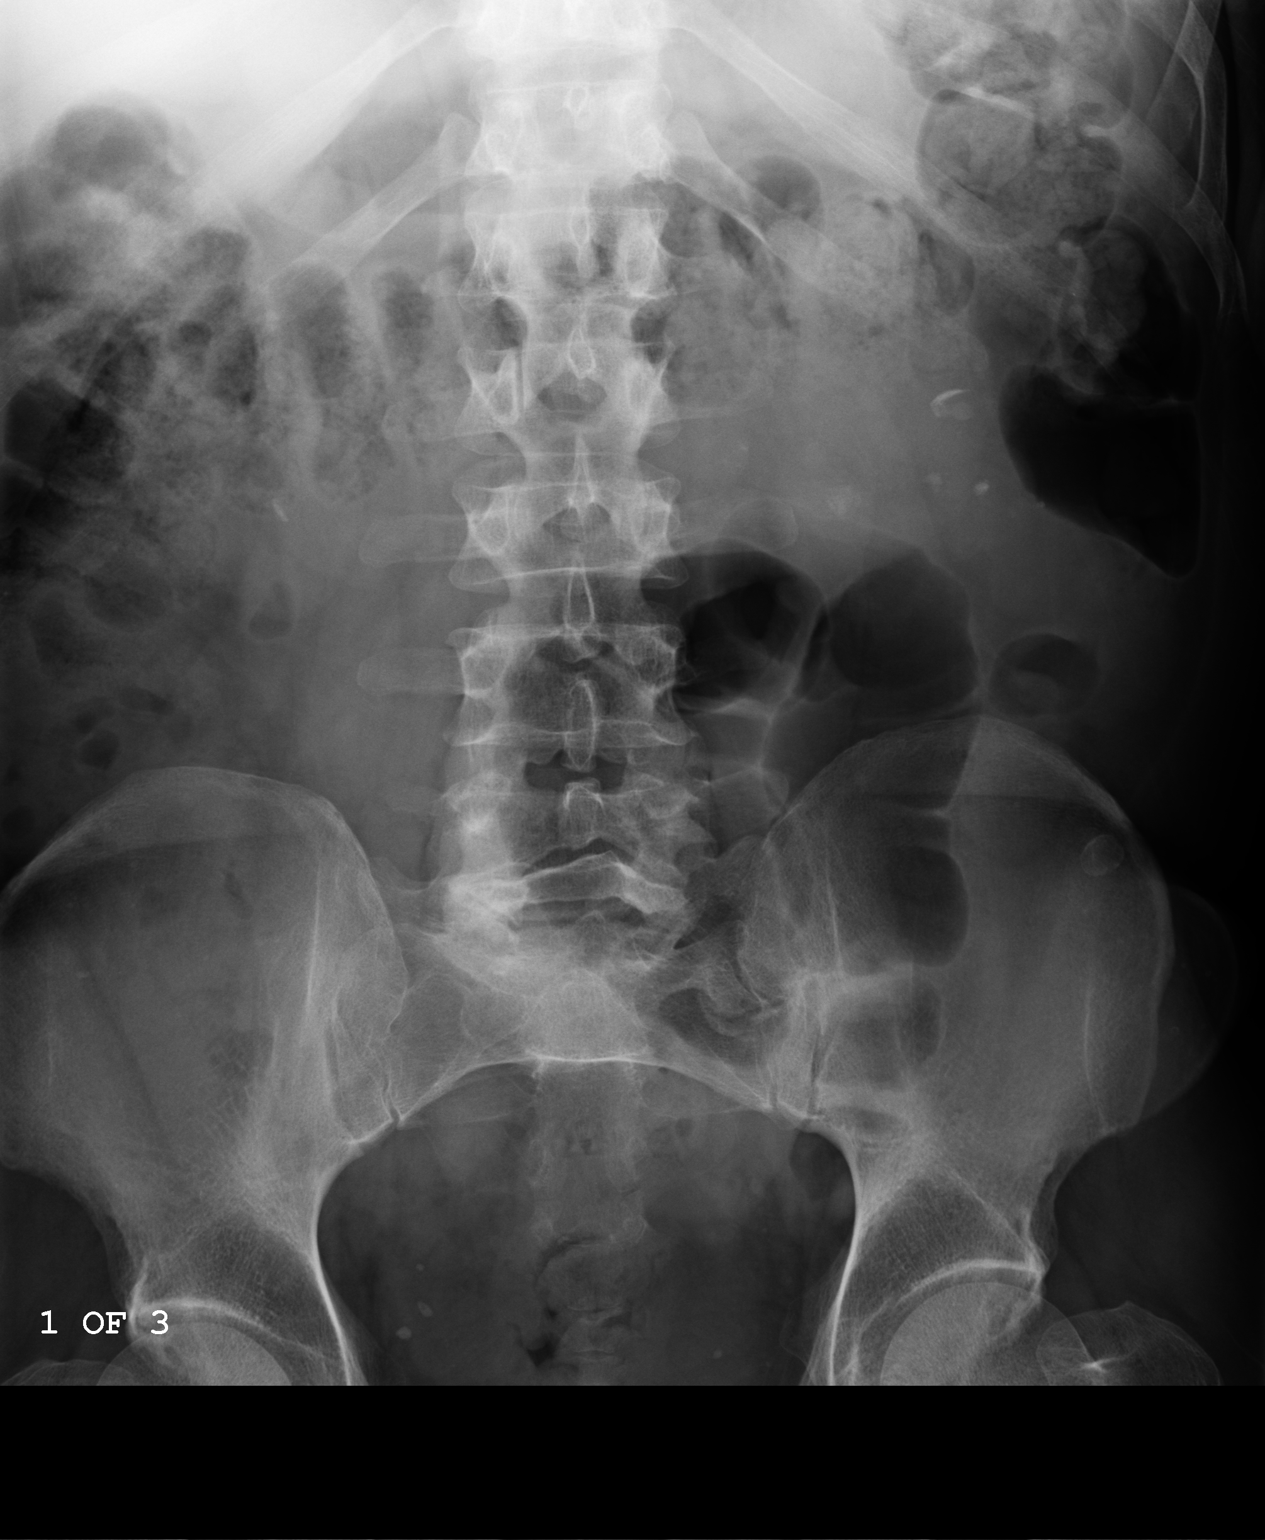
[im 2/3]
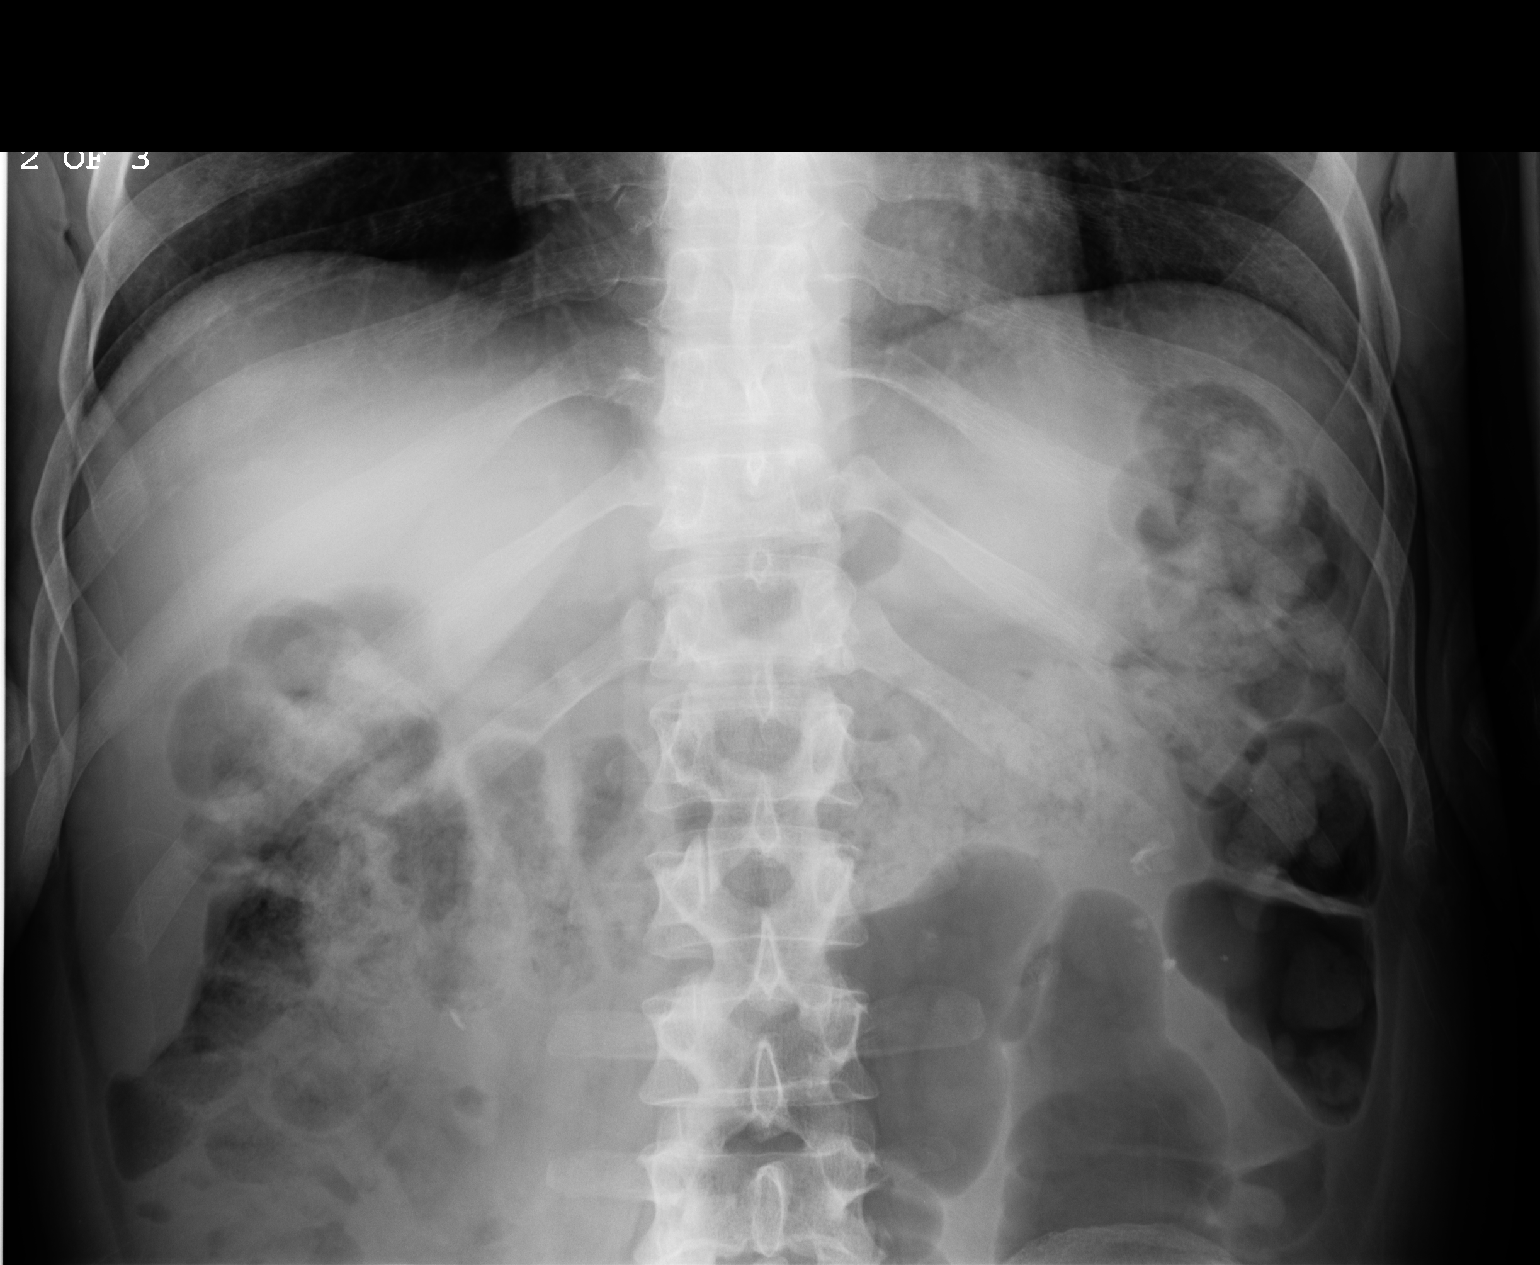
[im 3/3]
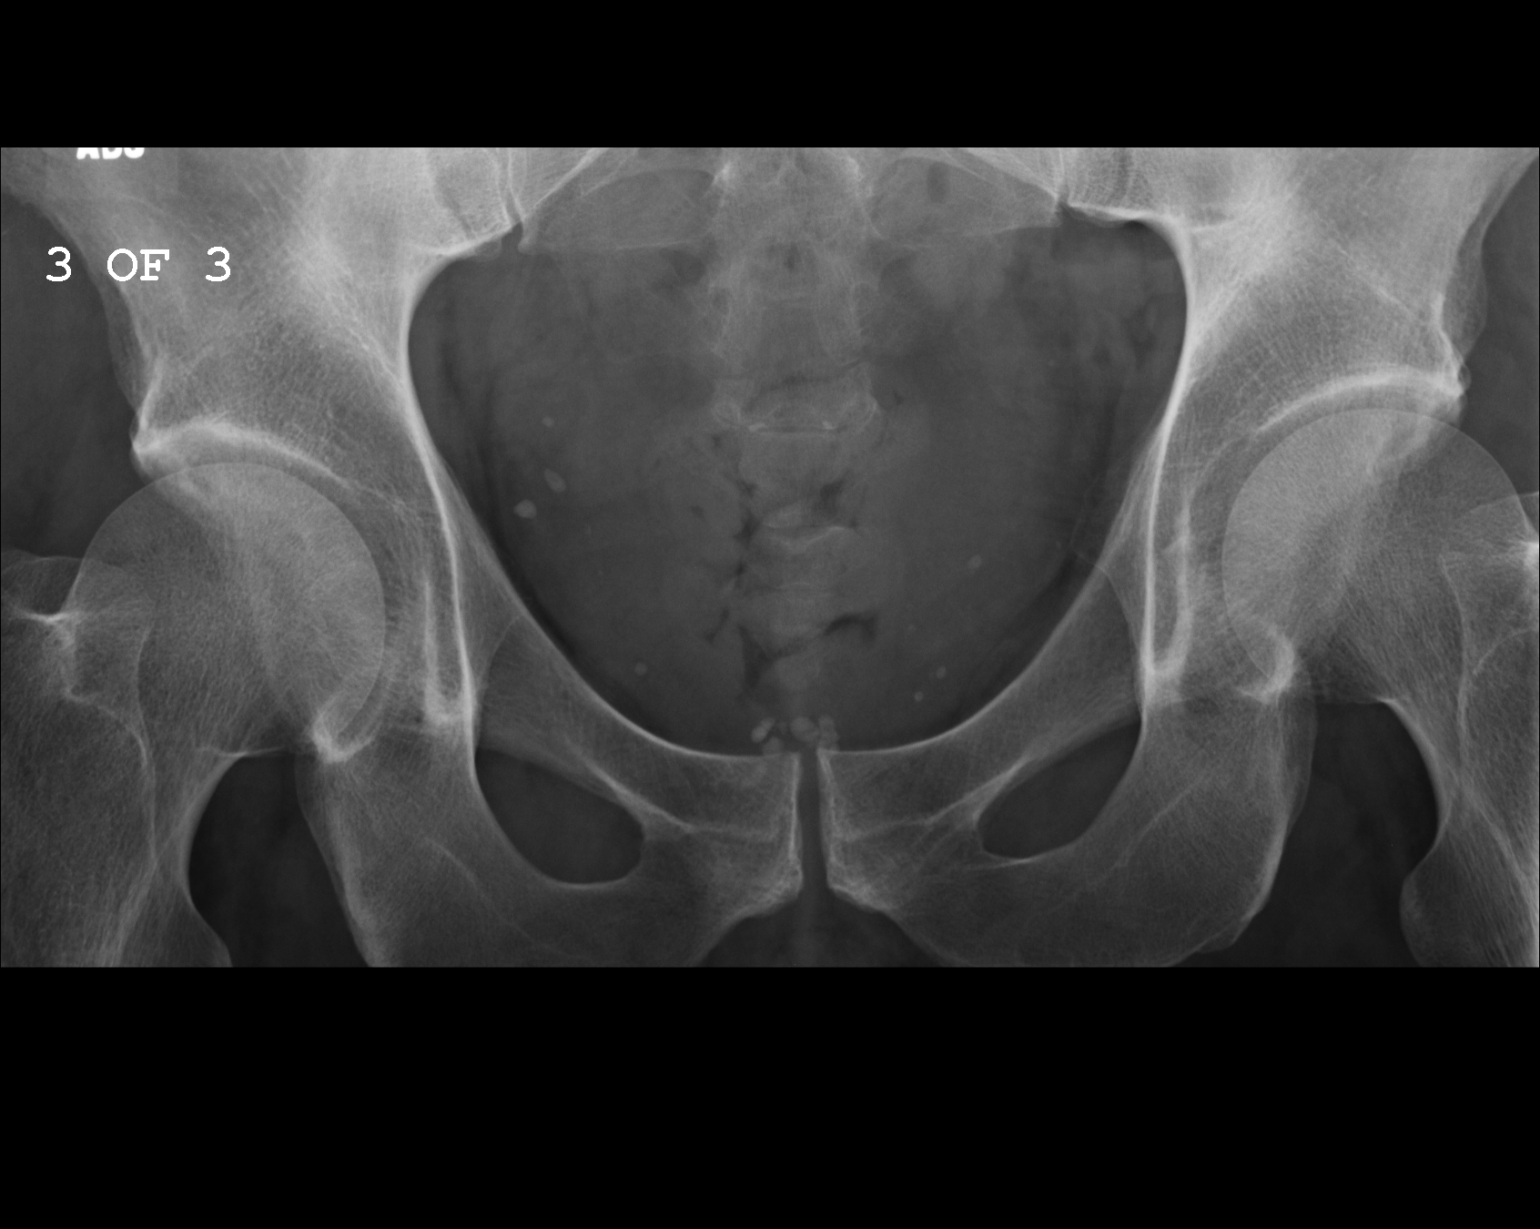

[3 of 3 positions shown; findings below may reference images not displayed]

PROCEDURE:     DXR - DXR KIDNEY URETER BLADDER  - January 13, 2008  [DATE]

RESULT:     There are calcifications that project over both kidneys.
Compared to the study [DATE], these have not significantly changed. The
majority of such calcifications lie on the LEFT over the lower pole. There
are phleboliths within the pelvis. The bowel gas pattern is within the
limits of normal.
IMPRESSION: There are four calcific densities projecting over the lower
pole of the LEFT kidney and one on the RIGHT. These do not appear greatly
changed since the prior study.

## 2010-08-22 IMAGING — CR DG ABDOMEN 1V
1 series · 1 of 1 positions shown · non-contrast
Comparison: none

REASON FOR EXAM: nephrolithiasis
COMMENTS:

[view not recorded]
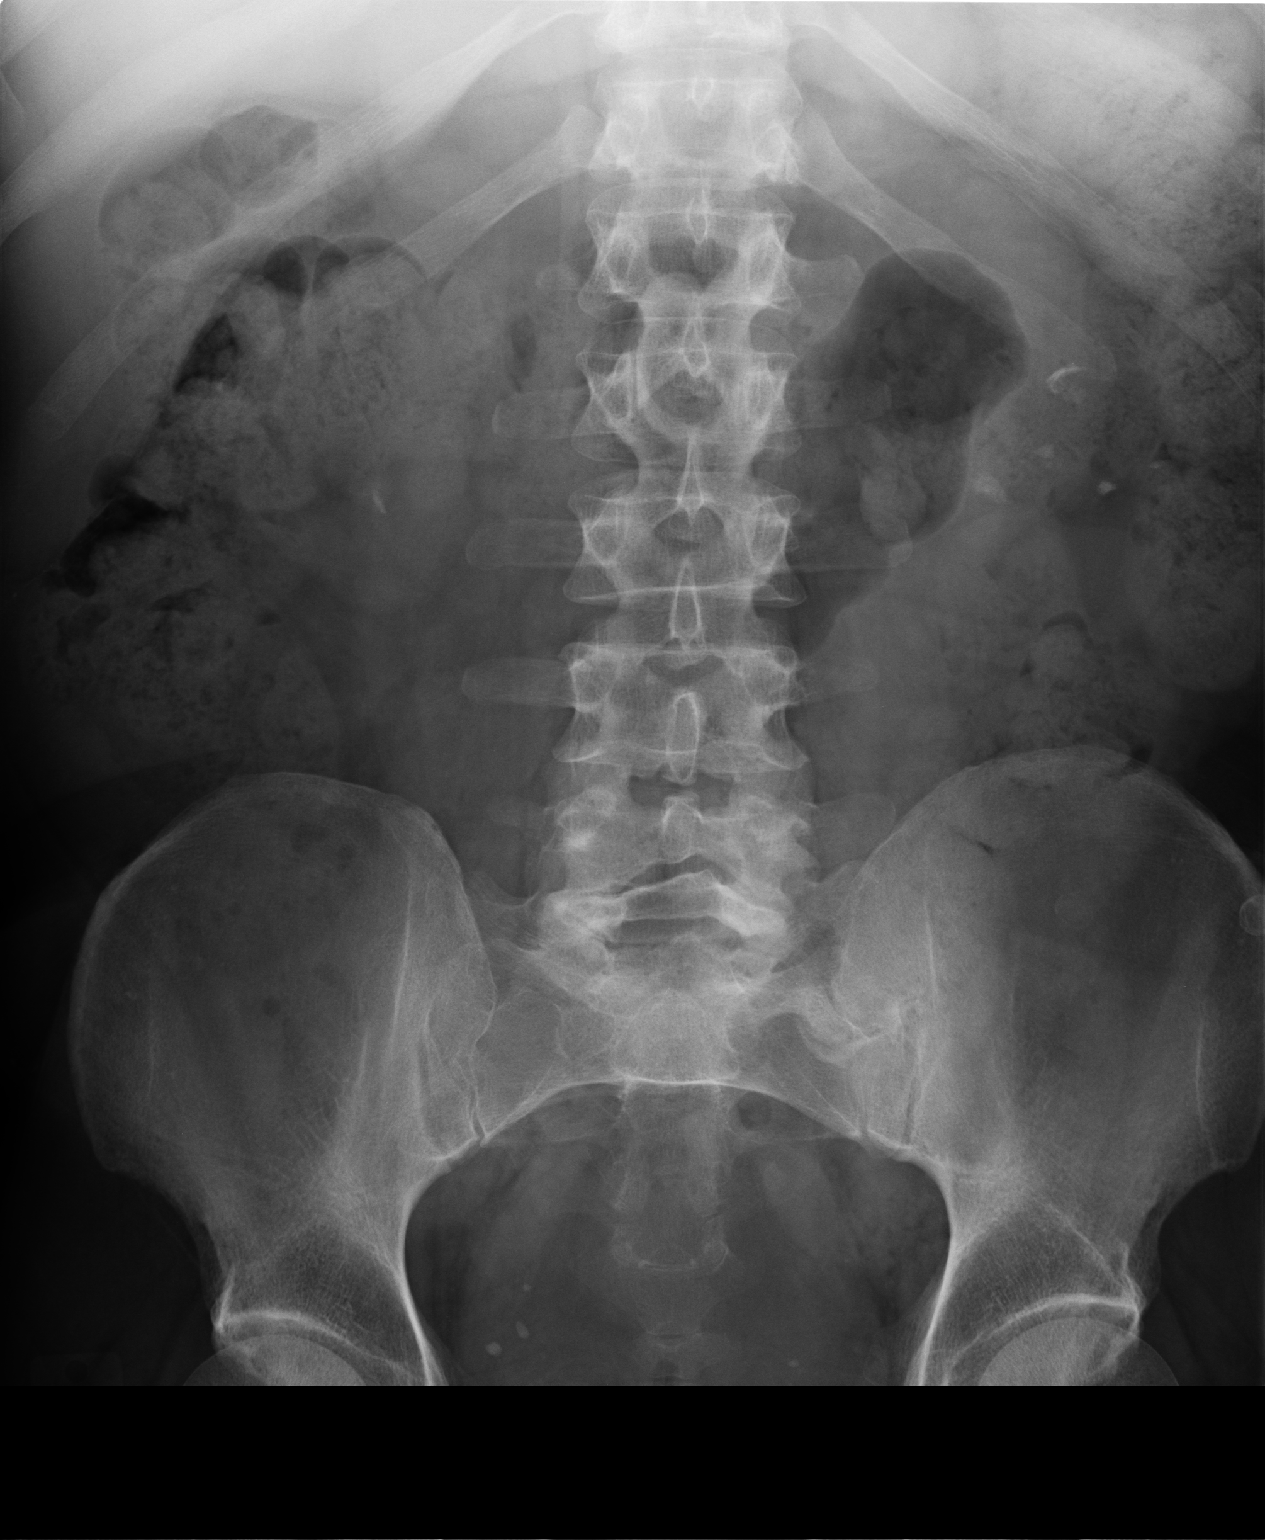

[1 of 1 positions shown; findings below may reference images not displayed]

PROCEDURE:     DXR - DXR KIDNEY URETER BLADDER  - January 28, 2008  [DATE]

RESULT:     Comparison is made to the prior exam of 01/13/2008. There are
again noted 4 or 5 calcifications projected over the lower pole of the LEFT
kidney and a single 8 mm calcification projected over the lower pole of the
RIGHT kidney. No definite ureteral stones are seen. Multiple phleboliths are
observed in the pelvis.
IMPRESSION: 1.     Bilateral nephrolithiasis.

## 2010-09-19 IMAGING — CT CT HEAD WITHOUT CONTRAST
2 series · 16 of 30 positions shown, 20 images · non-contrast
Comparison: none

REASON FOR EXAM: Altered mental status
COMMENTS:   LMP: (Male)

[Series 2: without · axial · non-contrast · 0.43mm/px · z∈[+992,+1116]mm · 13 of 31 slices shown, 17 images]
[im 3/31  brain]
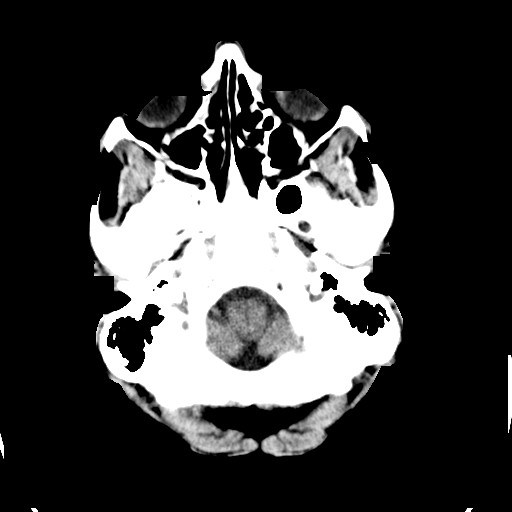
[im 3/31  bone]
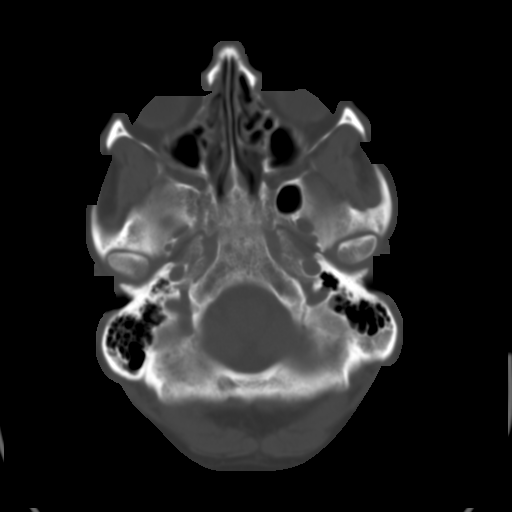
[im 5/31  brain]
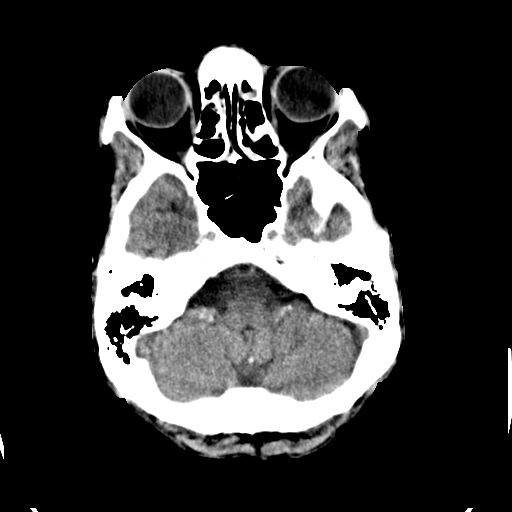
[im 7/31  brain]
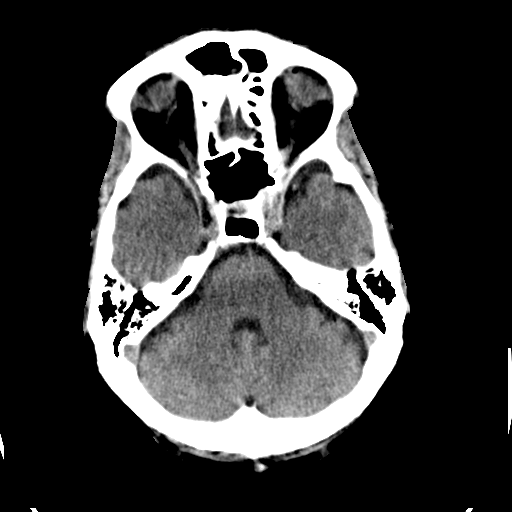
[im 9/31  brain]
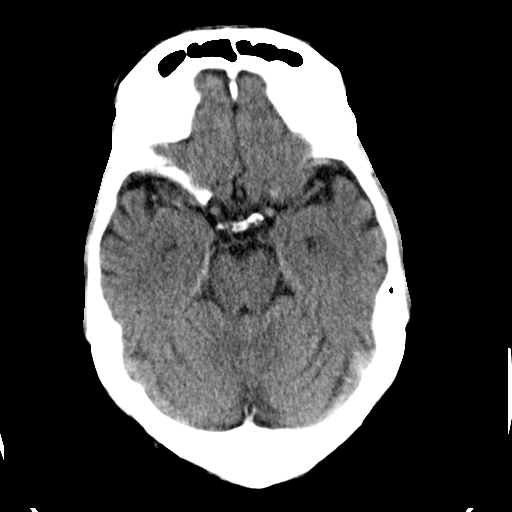
[im 11/31  brain]
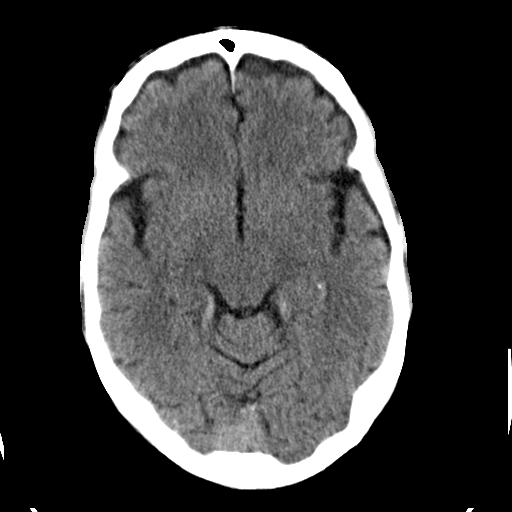
[im 11/31  bone]
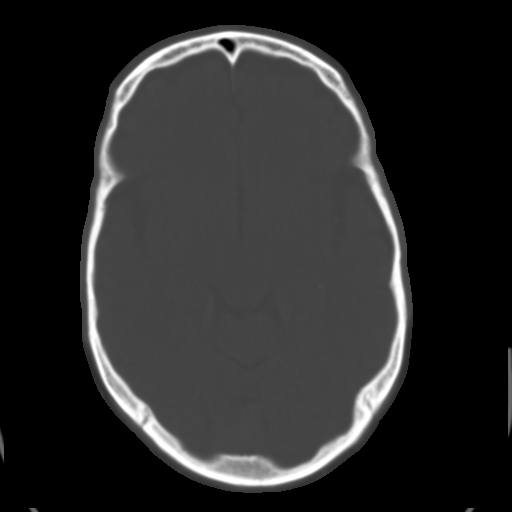
[im 13/31  brain]
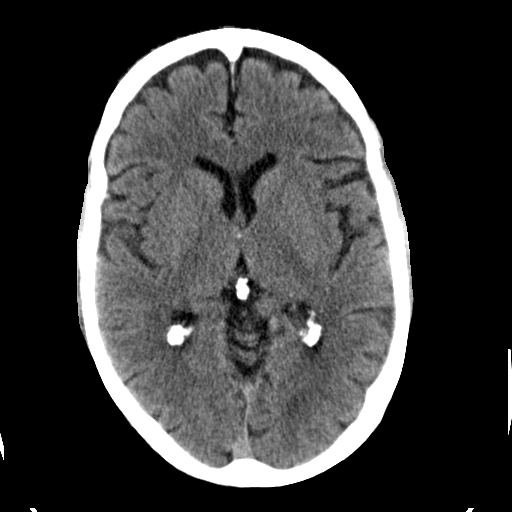
[im 16/31  brain]
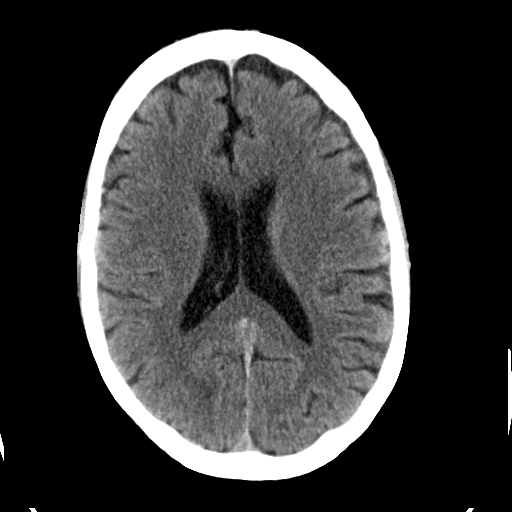
[im 18/31  brain]
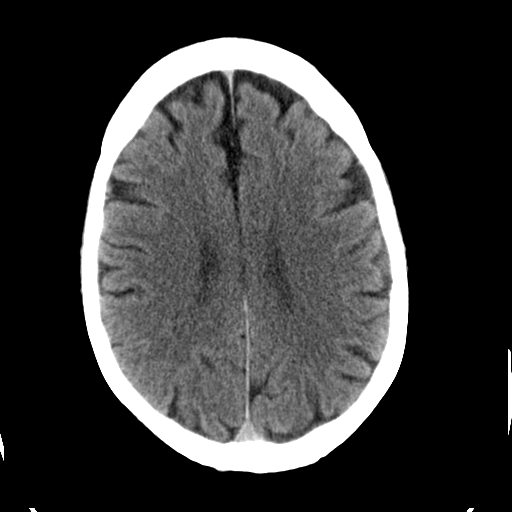
[im 20/31  brain]
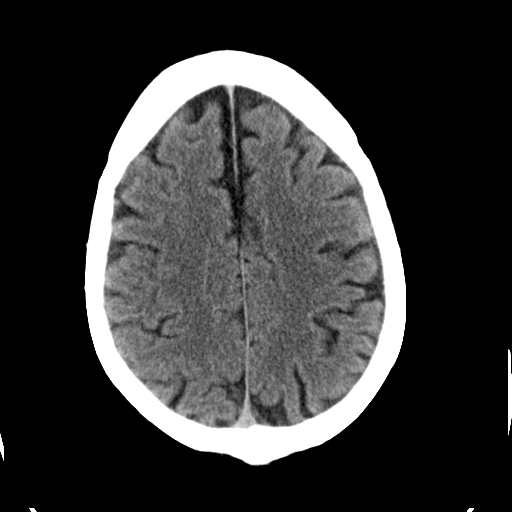
[im 20/31  bone]
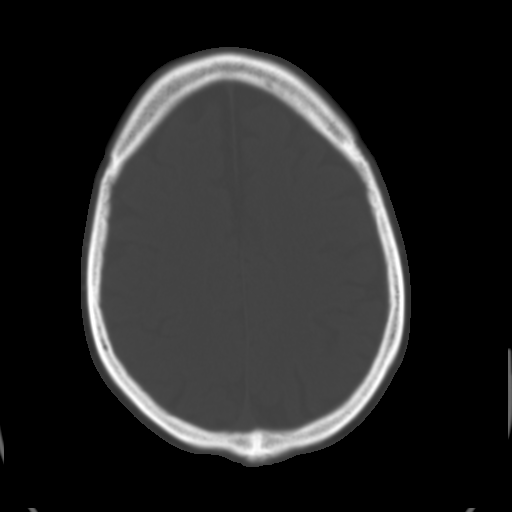
[im 22/31  brain]
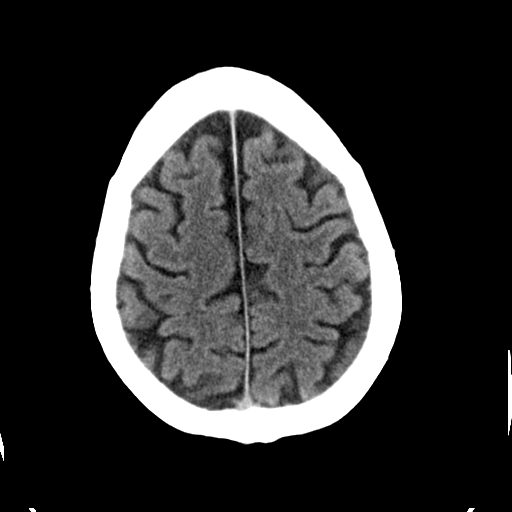
[im 24/31  brain]
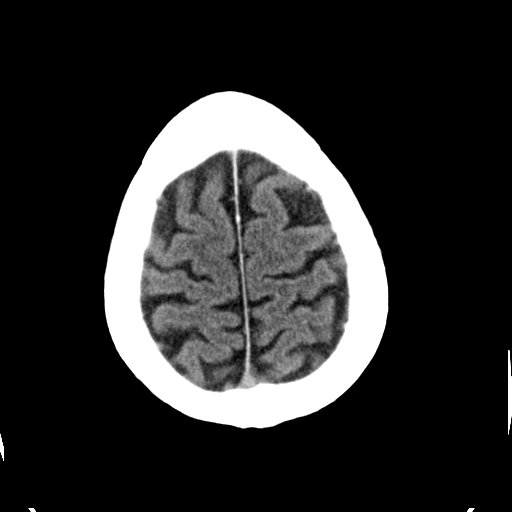
[im 26/31  brain]
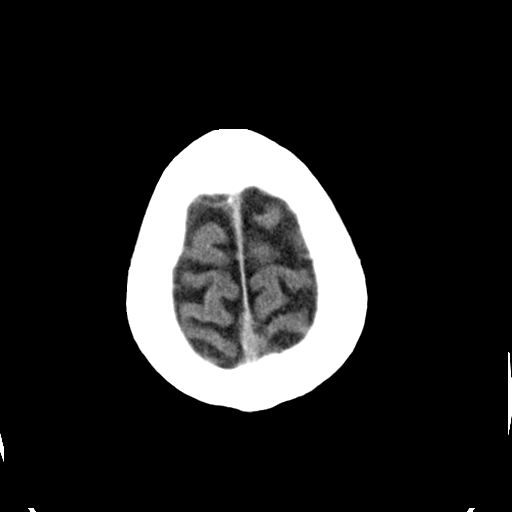
[im 28/31  brain]
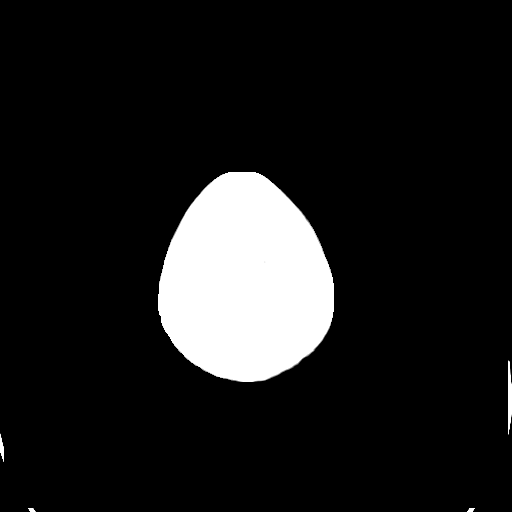
[im 28/31  bone]
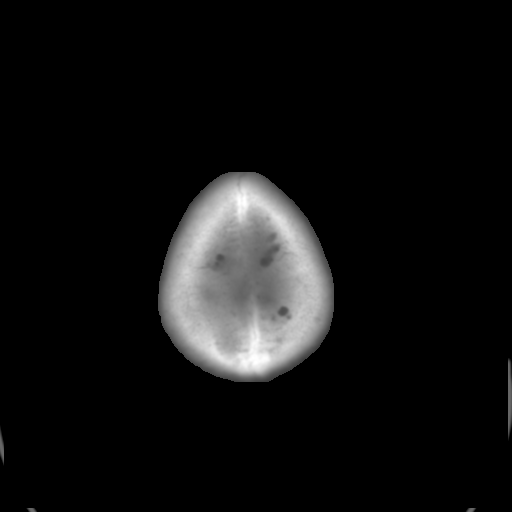

[Series 3: bone · axial · 0.43mm/px · z∈[+992,+1032]mm · 3 of 31 slices shown]
[im 3/31  bone]
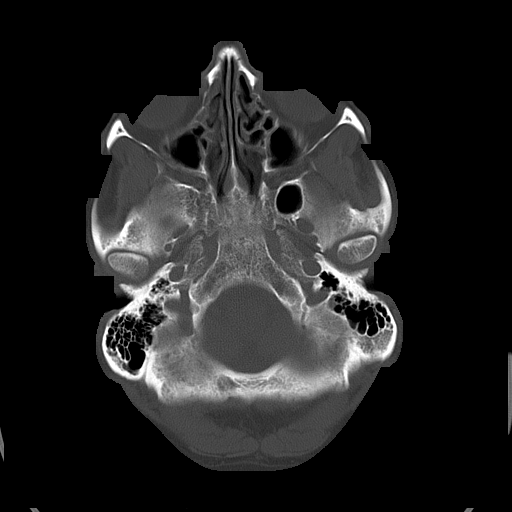
[im 7/31  bone]
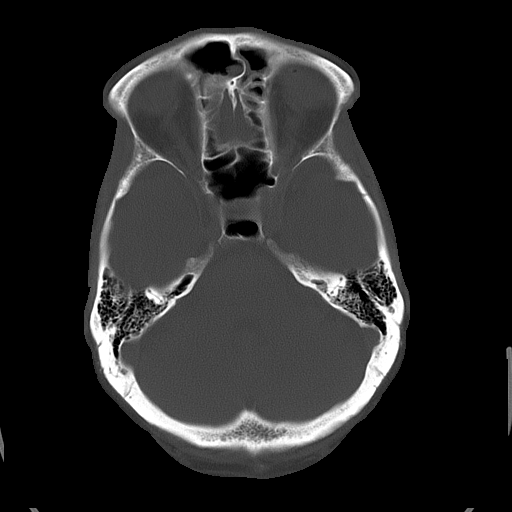
[im 11/31  bone]
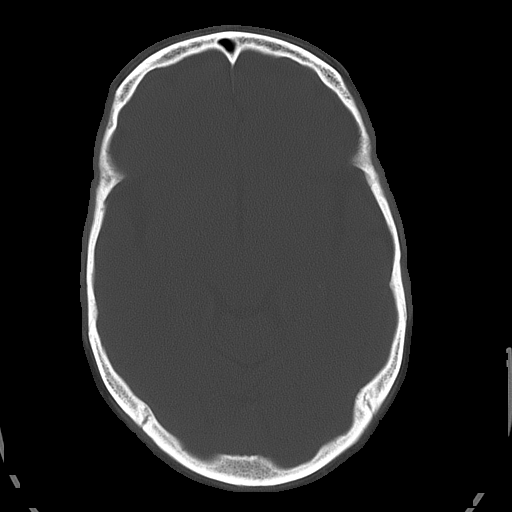

[16 of 30 positions shown; findings below may reference images not displayed]

PROCEDURE:     CT  - CT HEAD WITHOUT CONTRAST  - February 25, 2008  [DATE]

RESULT:     There is no evidence of intra-axial or extra-axial fluid
collections or evidence of acute hemorrhage. The visualized bony skeleton
demonstrates no evidence of fracture or dislocation. Mucosal thickening is
appreciated within the RIGHT maxillary sinus as well as within the ethmoid
air cells.
IMPRESSION: 1.     No evidence of focal or acute intracranial abnormalities.
2.     If there is persistent clinical concern, further evaluation with MRI
is recommended.
3.     Dr. Maryuri of the Emergency Department was informed of these findings
at the time of the initial interpretation.

## 2010-11-04 IMAGING — CR DG ABDOMEN 1V
1 series · 2 of 2 positions shown · non-contrast
Comparison: none

REASON FOR EXAM: Nephrolithiasis
COMMENTS:

[Series 1: view not recorded · 0.17mm/px · 2 of 2 slices shown]
[im 1/2]
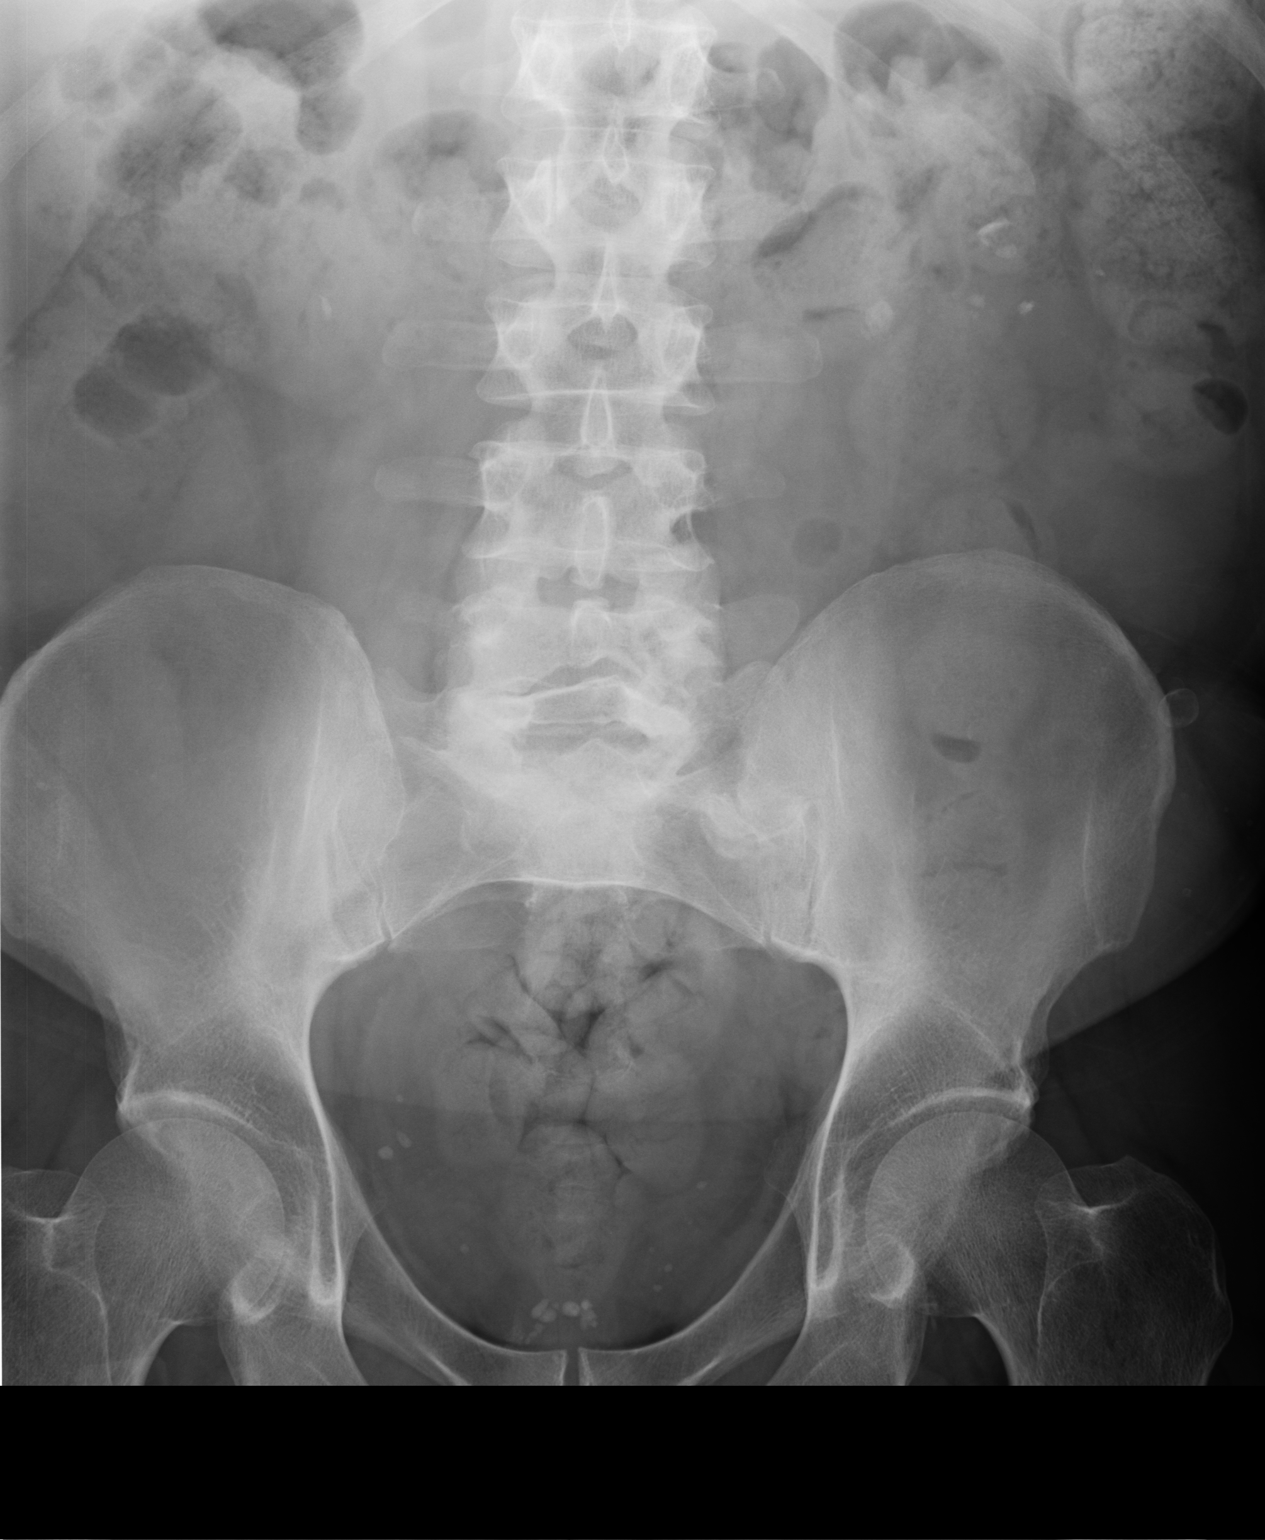
[im 2/2]
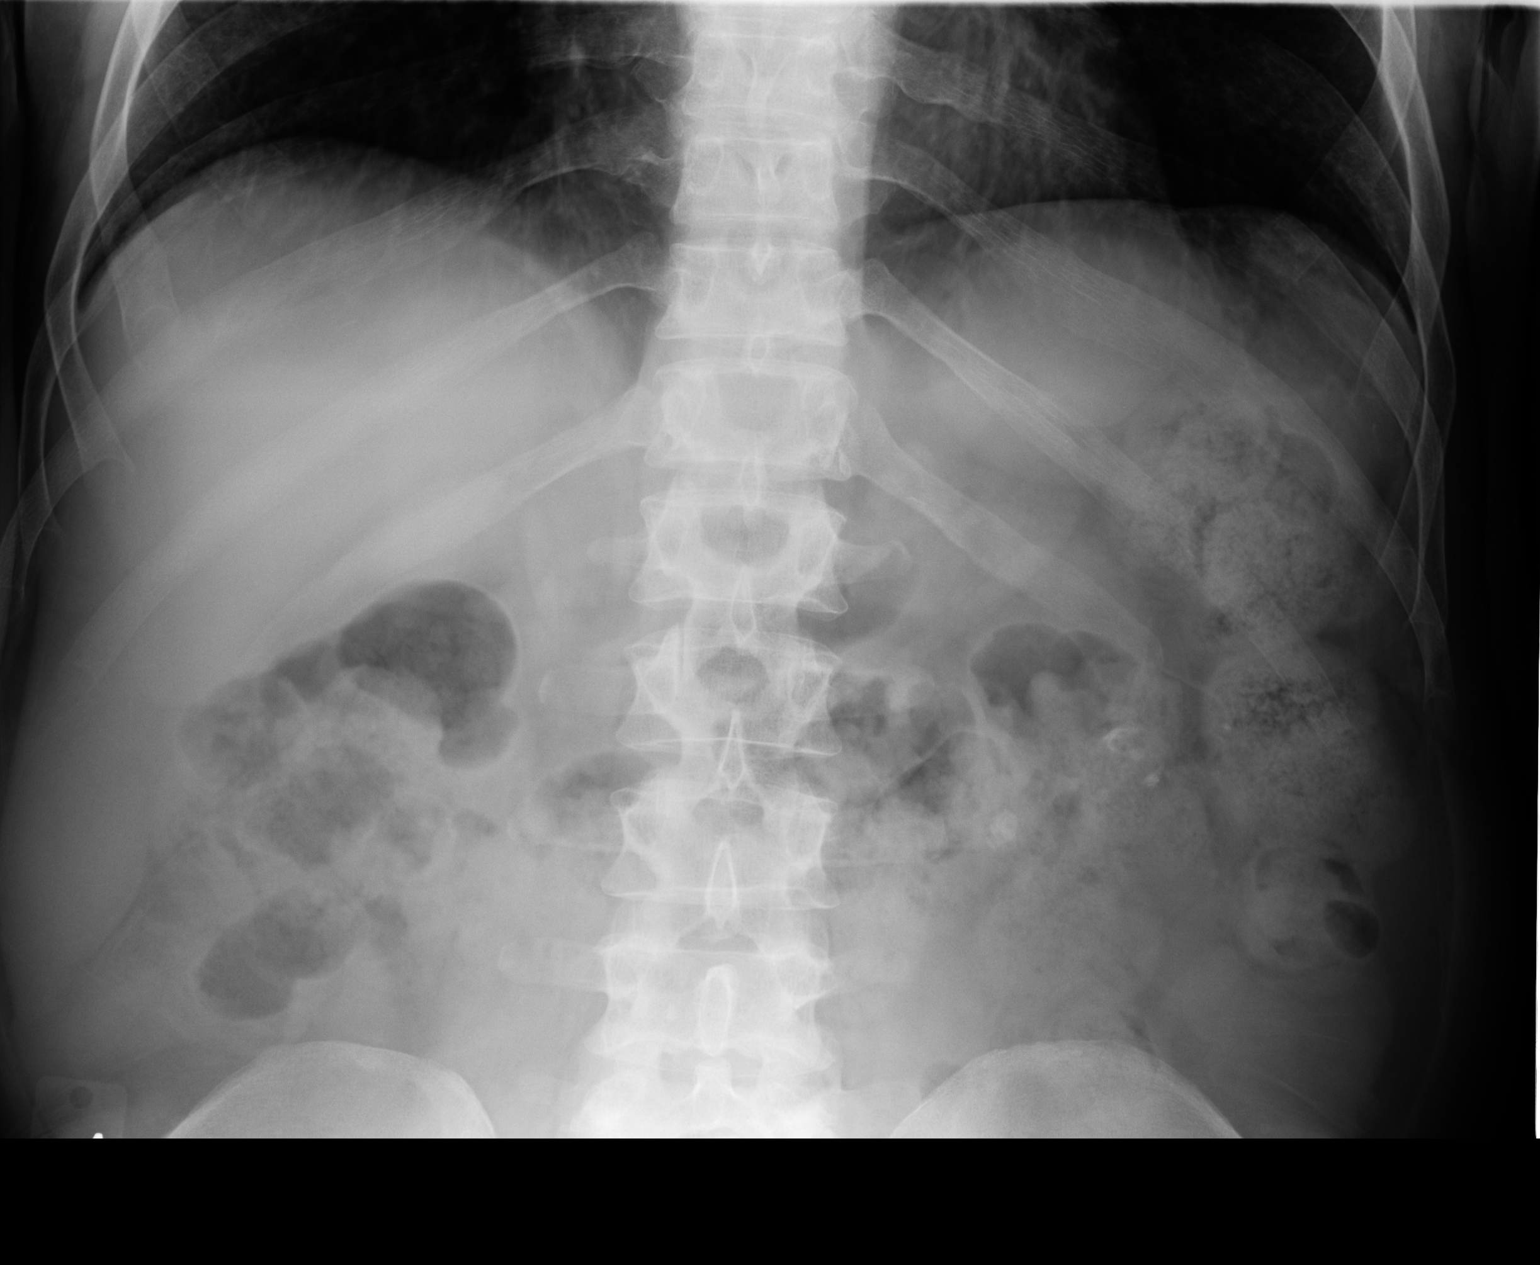

[2 of 2 positions shown; findings below may reference images not displayed]

PROCEDURE:     DXR - DXR KIDNEY URETER BLADDER  - April 11, 2008  [DATE]

RESULT:     Comparison is made to a prior exam 01/28/2008.

There are observed calcifications projected over the midpole region of the
LEFT kidney. The largest is projected at the level of the LEFT renal pelvis
and measures approximately 1.0 cm in diameter. On the RIGHT, there is a
mm calcification in the lower pole region consistent with a stone. The mid
and upper pole of the RIGHT kidney is obscured by bowel and bowel content.
No definite ureteral calcifications are seen. Phleboliths are observed in
the pelvis bilaterally. There is calcification in the region of the prostate.
IMPRESSION: Bilateral nephrolithiasis.

## 2010-11-20 ENCOUNTER — Encounter: Payer: Self-pay | Admitting: Family Medicine

## 2010-12-01 ENCOUNTER — Encounter: Payer: Self-pay | Admitting: Family Medicine

## 2011-02-02 IMAGING — CR DG ABDOMEN 1V
1 series · 2 of 2 positions shown · non-contrast
Comparison: none

REASON FOR EXAM: nephrolithiasis
COMMENTS:

[Series 1: view not recorded · 0.17mm/px · 2 of 2 slices shown]
[im 1/2]
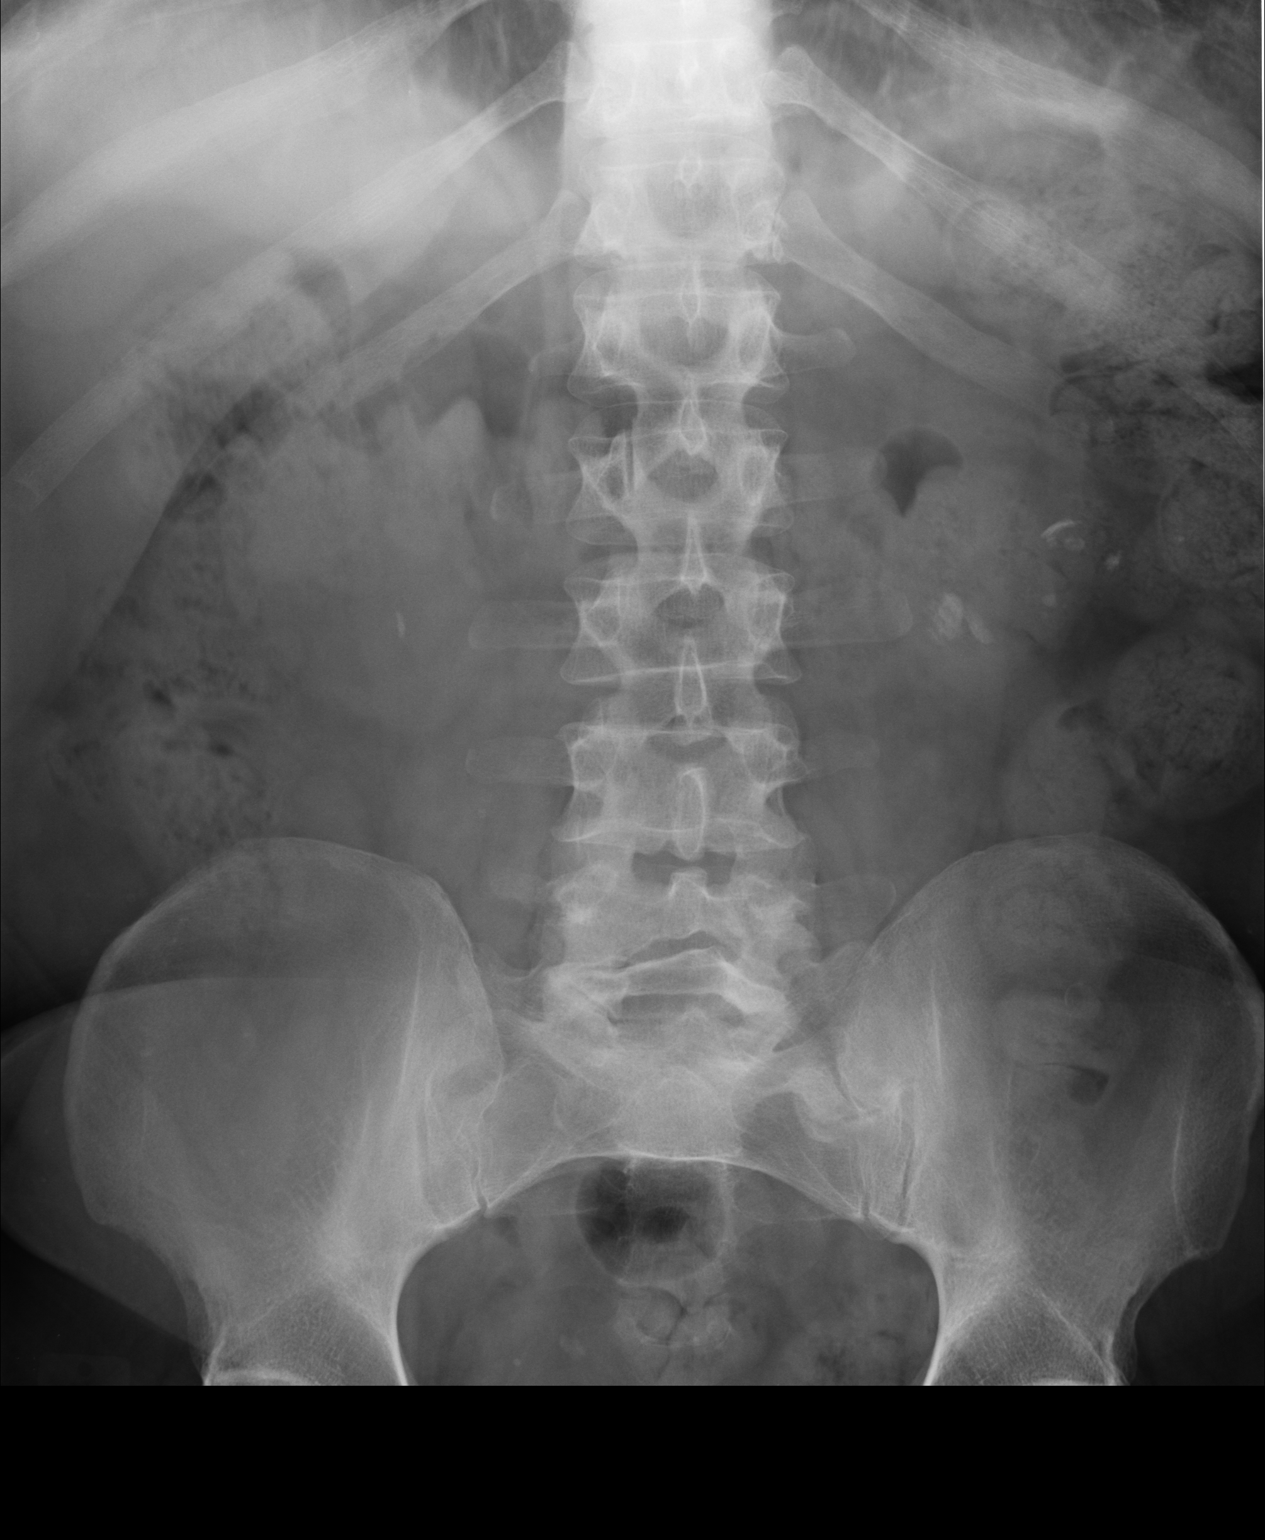
[im 2/2]
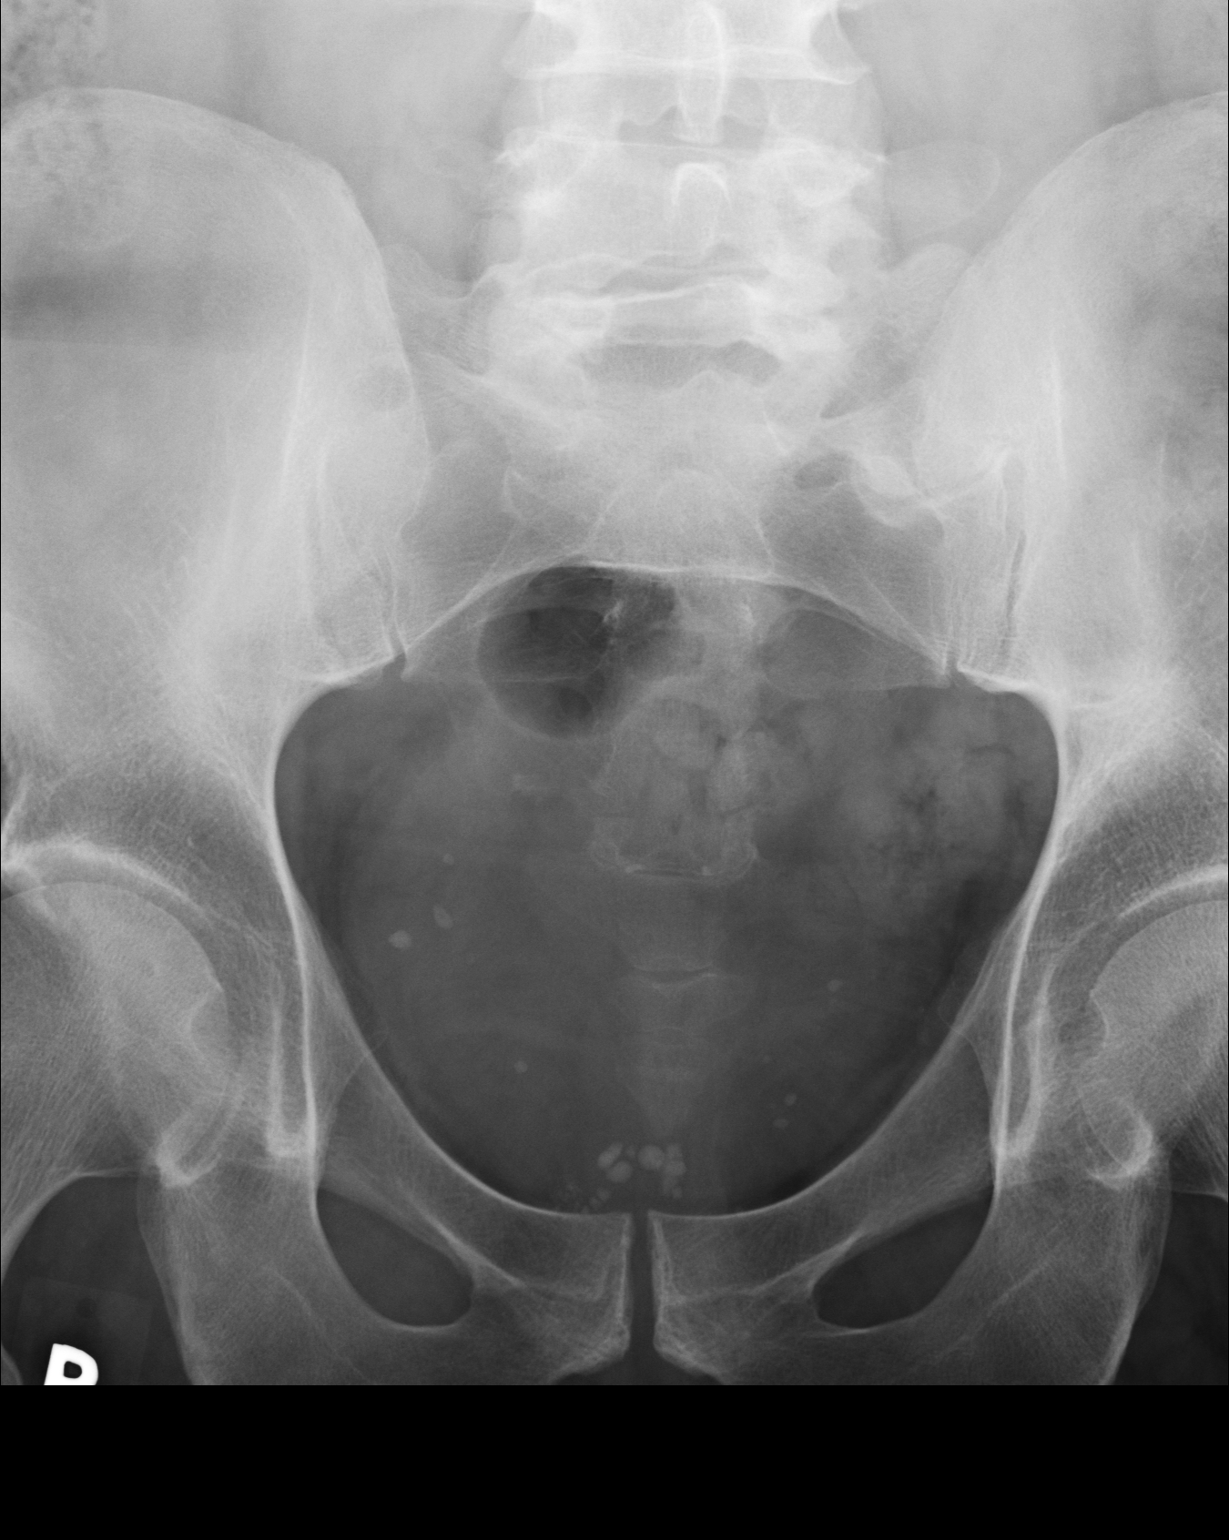

[2 of 2 positions shown; findings below may reference images not displayed]

PROCEDURE:     DXR - DXR KIDNEY URETER BLADDER  - July 10, 2008  [DATE]

RESULT:     Frontal view of the abdomen and pelvis is obtained. Comparison
is made to a prior study dated 04/11/2008.

Multiple calcified densities project within the lower pole region of the
left kidney. These appear stable when compared to the previous study. A
linear shaped area of calcific density projects in the region of the lower
pole of the right kidney. A large amount of stool is appreciated within the
colon. Stable phleboliths are demonstrated within the pelvis.
IMPRESSION: Findings consistent with bilateral nephrolithiasis which
appears stable when compared to the previous study.

## 2011-02-23 IMAGING — CT CT ABD-PELV W/O CM
1 of 2 series · 16 of 32 positions shown, 20 images · non-contrast
Comparison: none

REASON FOR EXAM: nephroliathasis
COMMENTS:

[Series 2: stone · axial · 0.82mm/px · z∈[-318,+96]mm · 16 of 150 slices shown, 20 images]
[im 6/150  soft-tissue]
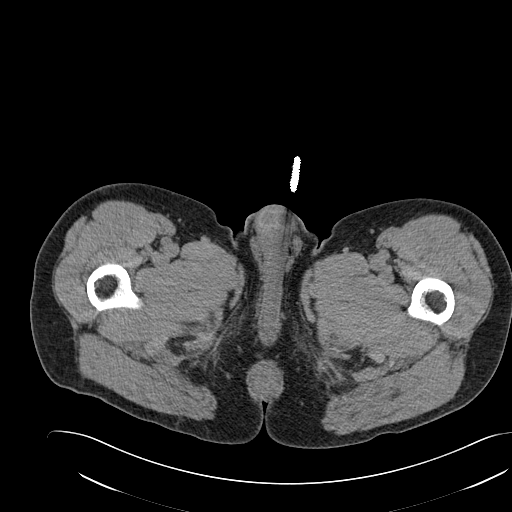
[im 6/150  bone]
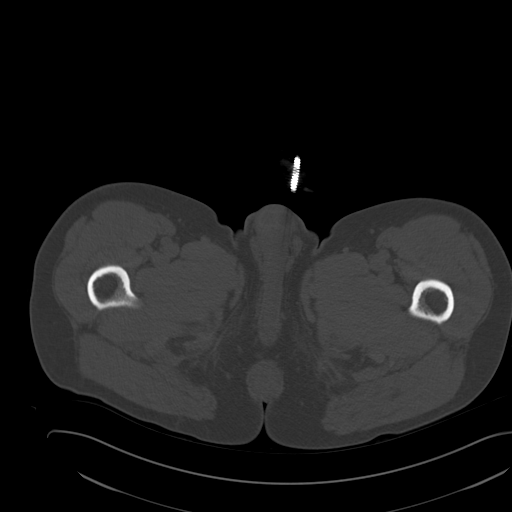
[im 18/150  soft-tissue]
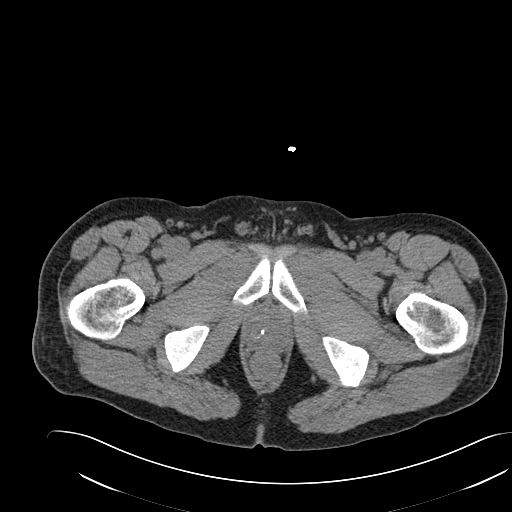
[im 29/150  soft-tissue]
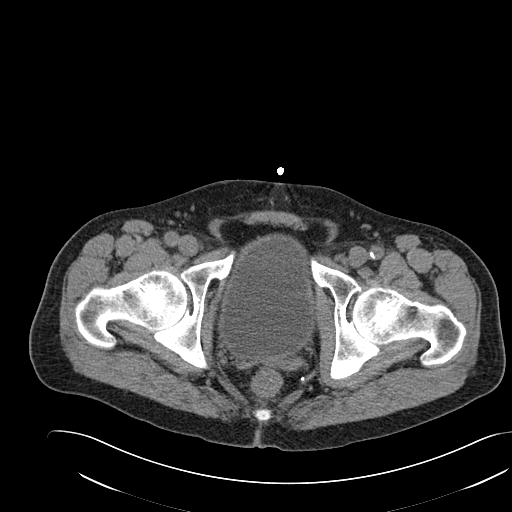
[im 41/150  soft-tissue]
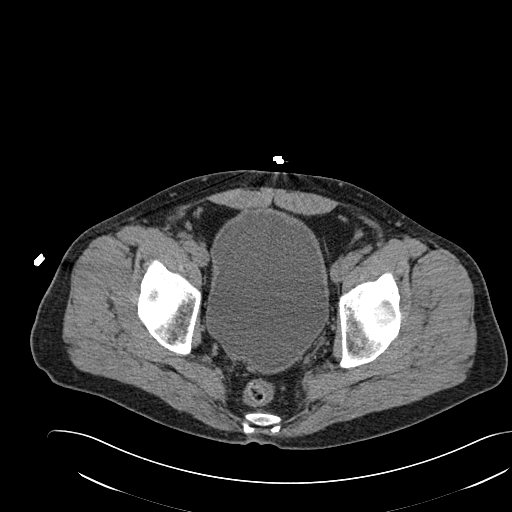
[im 52/150  soft-tissue]
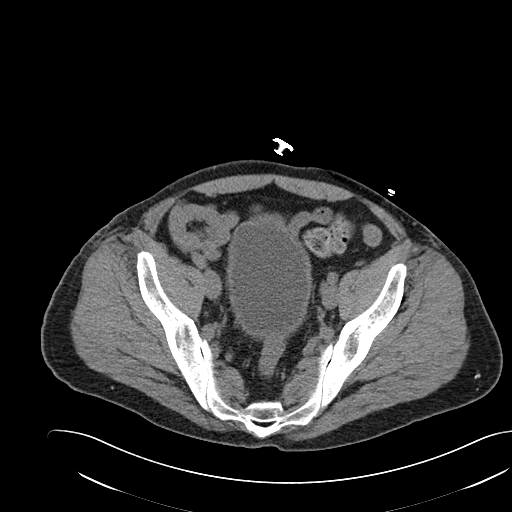
[im 58/150  soft-tissue]
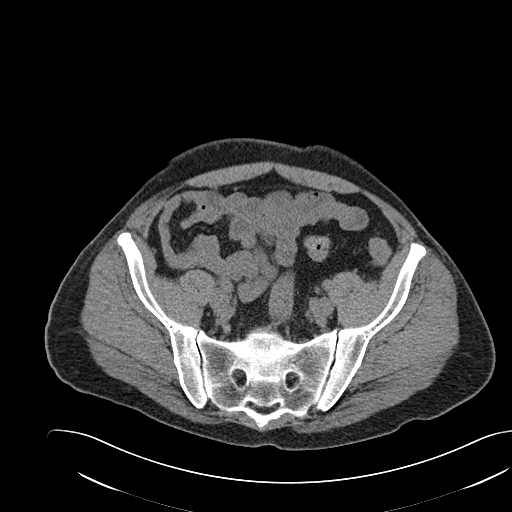
[im 69/150  soft-tissue]
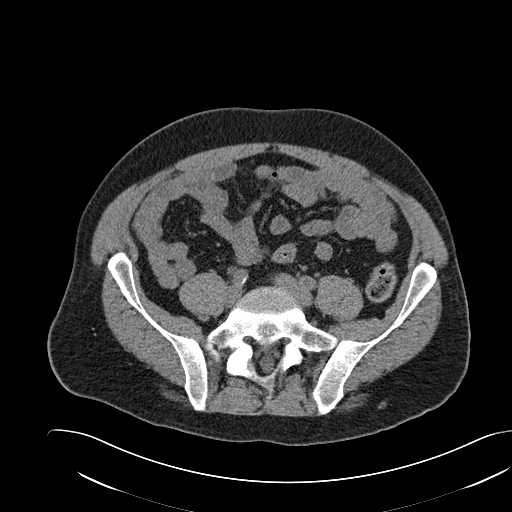
[im 81/150  soft-tissue]
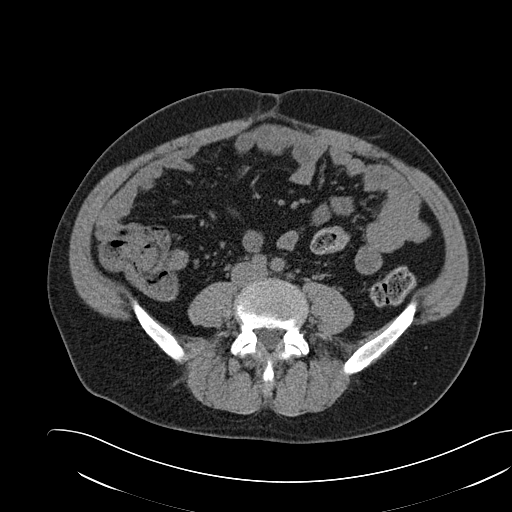
[im 92/150  soft-tissue]
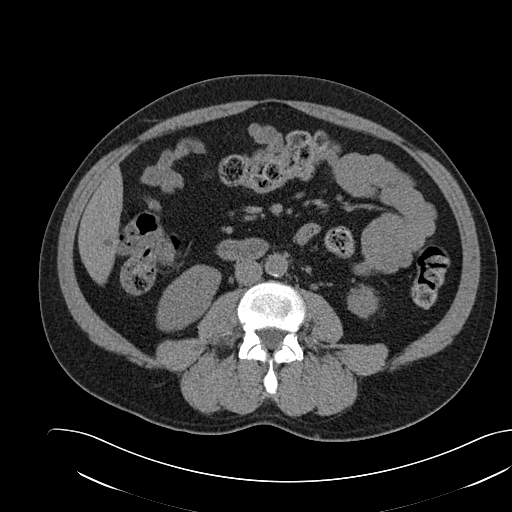
[im 92/150  bone]
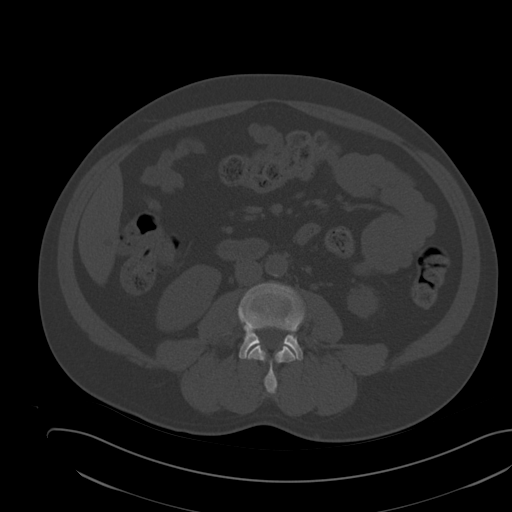
[im 98/150  soft-tissue]
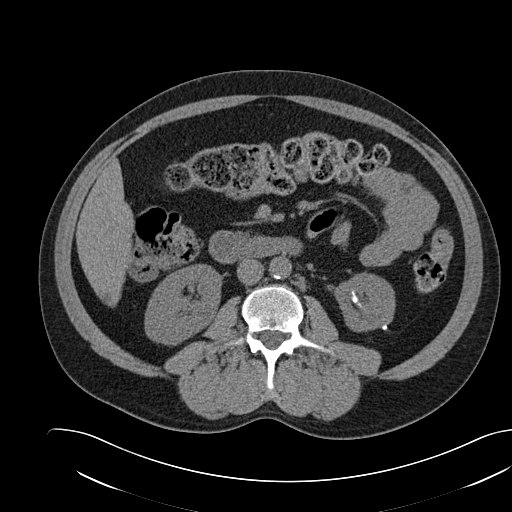
[im 109/150  soft-tissue]
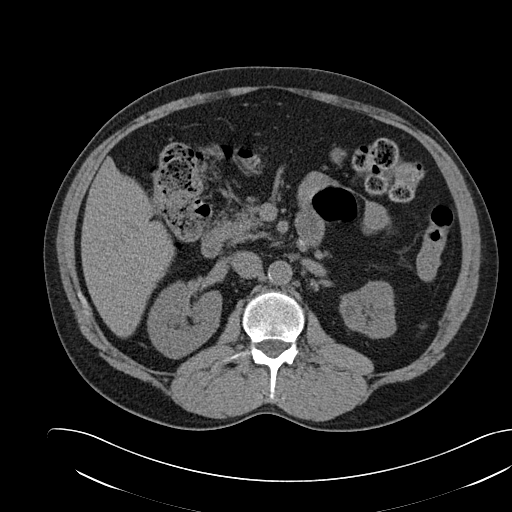
[im 121/150  soft-tissue]
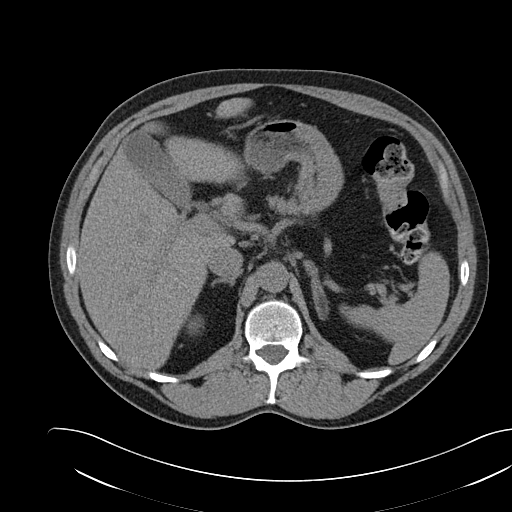
[im 127/150  lung]
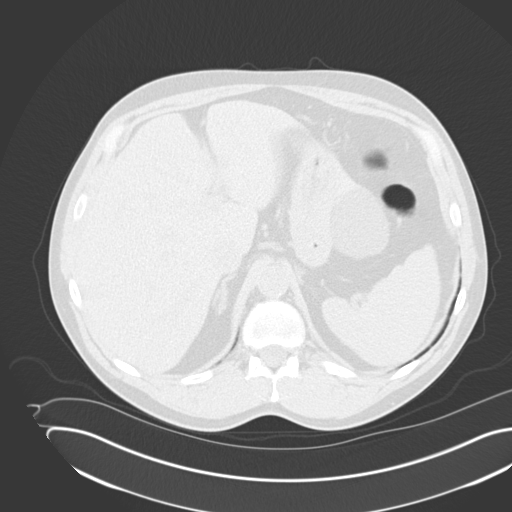
[im 132/150  soft-tissue]
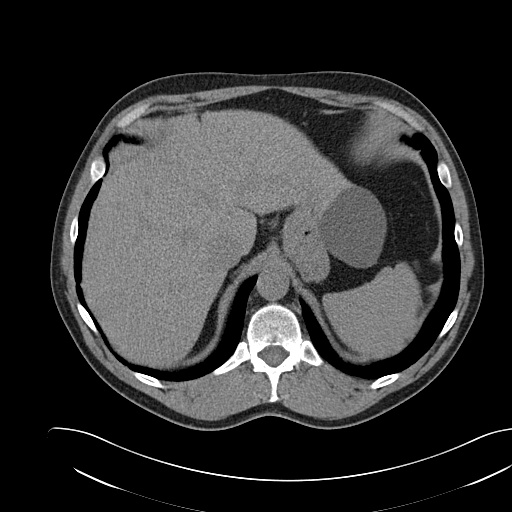
[im 132/150  lung]
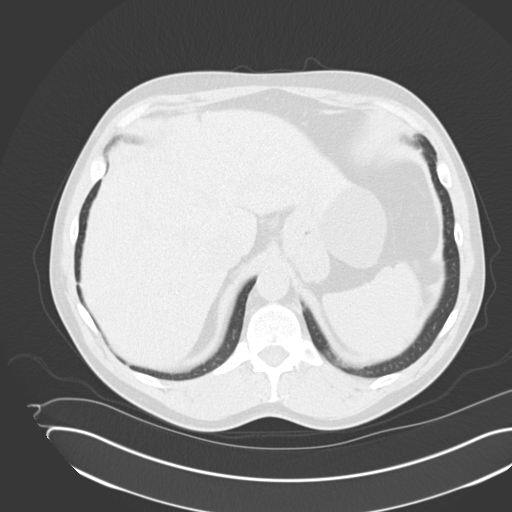
[im 138/150  lung]
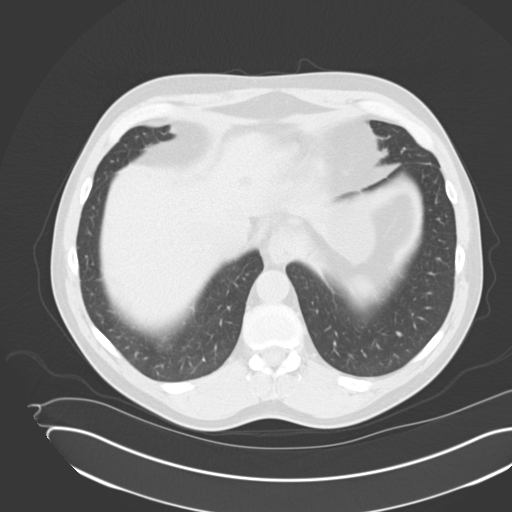
[im 144/150  soft-tissue]
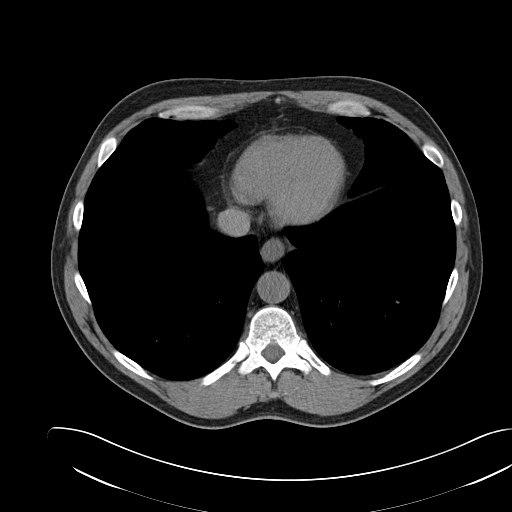
[im 144/150  lung]
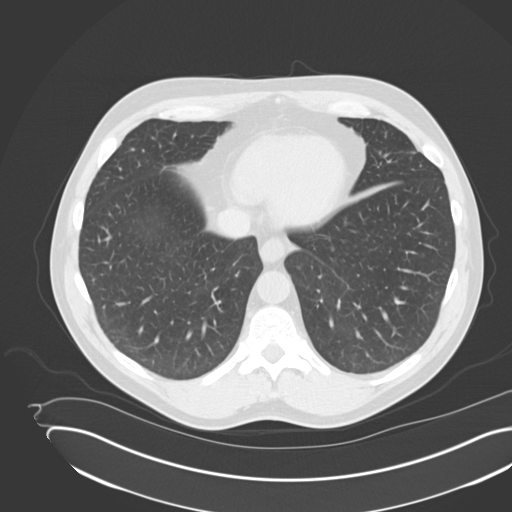

[16 of 32 positions shown; findings below may reference images not displayed]

PROCEDURE:     CT  - CT ABDOMEN AND PELVIS W[DATE]  [DATE]

RESULT:     History: Back pain, nephrolithiasis

Comparison studies :CT of the pelvis of 02/10/2007

Procedure and findings: Nonenhanced CT the abdomen and pelvis obtained.
Multiple hepatic cysts are present. Spleen is normal. Pancreas normal.
Adrenals are normal. Large left renal calcifications present. Is difficult
to determine disruptions a large stone in a calyx or calyceal diverticulum
or nephrocalcinosis. Multiple stones are noted centrally in both kidneys
consistent with nephrolithiasis. There is no hydroureter. No obstructing
ureteral stones noted. . No pelvic adenopathy noted. Lung bases are clear.
IMPRESSION: 1. Stable renal calcifications and/or stones. Similar findingswere noted on
a prior study of 02/10/2007.
2.Hepatic cysts.

## 2011-06-05 IMAGING — CR DG ABDOMEN 1V
1 series · 2 of 2 positions shown · non-contrast
Comparison: none

REASON FOR EXAM: nephrolithiasis
COMMENTS:

[Series 1: view not recorded · 0.17mm/px · 2 of 2 slices shown]
[im 1/2]
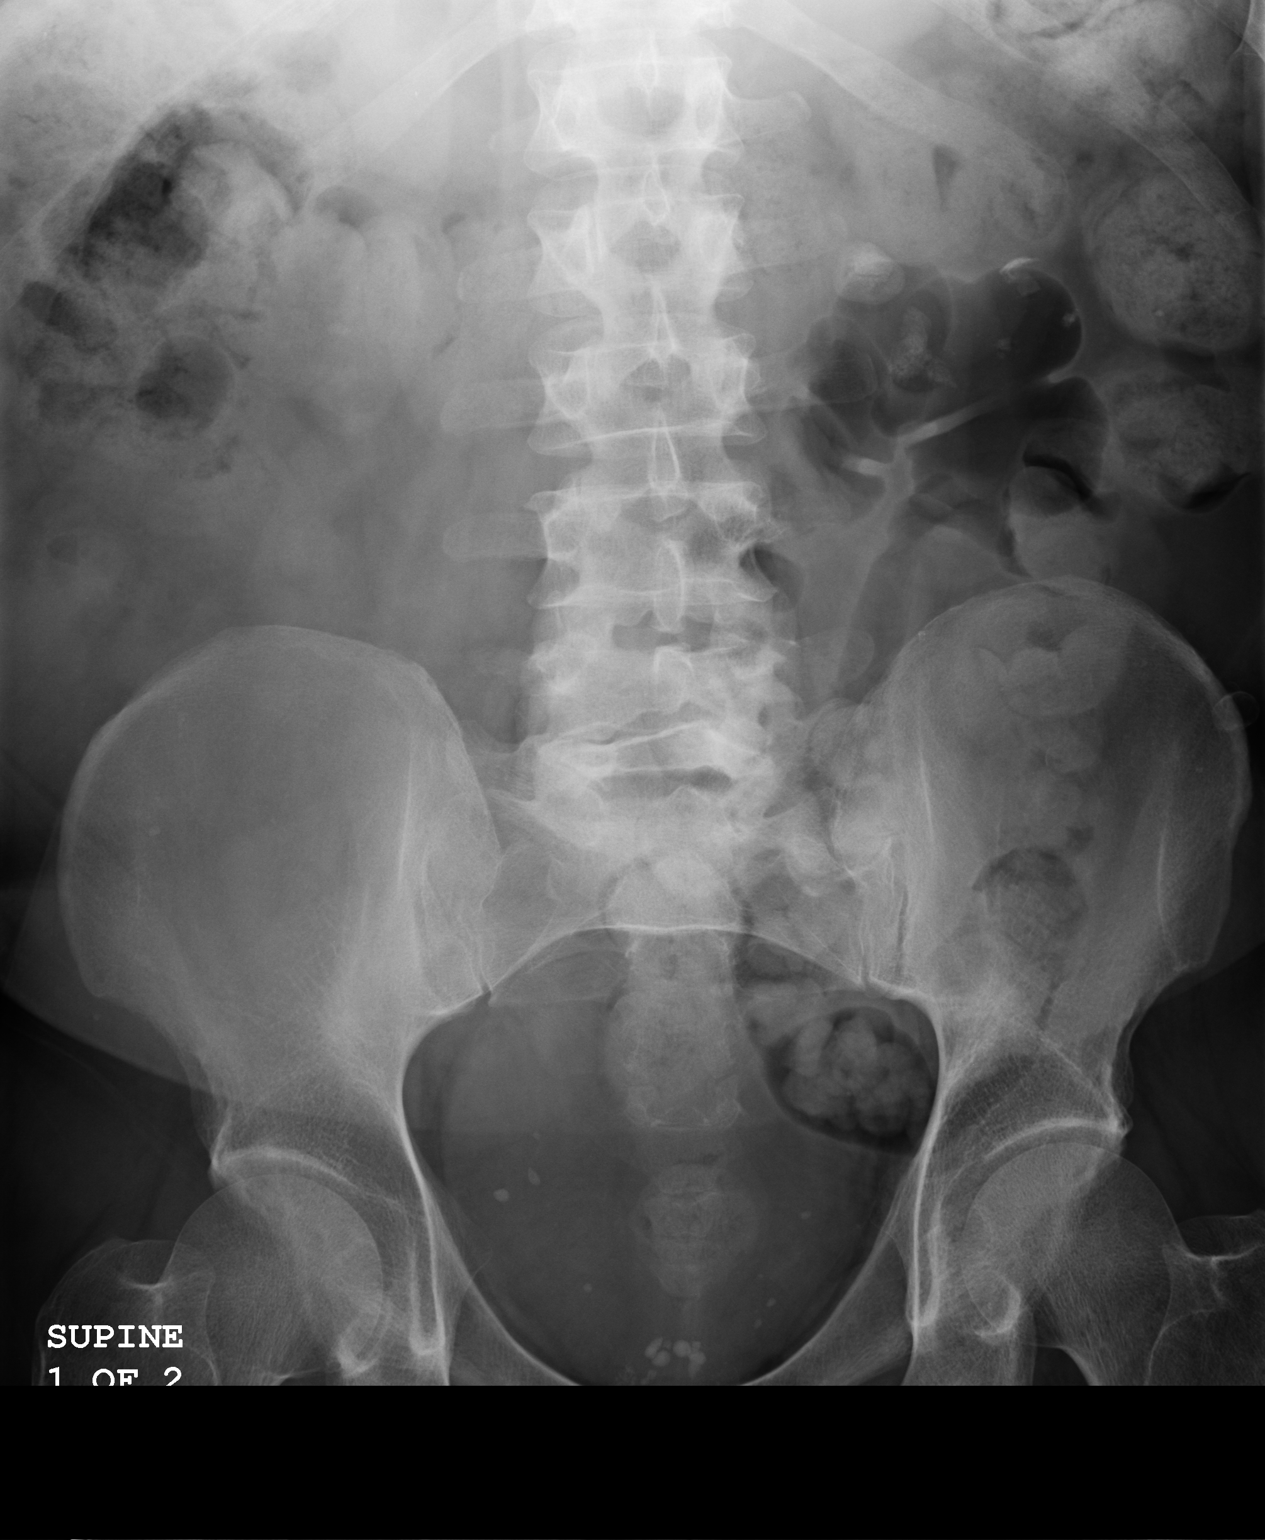
[im 2/2]
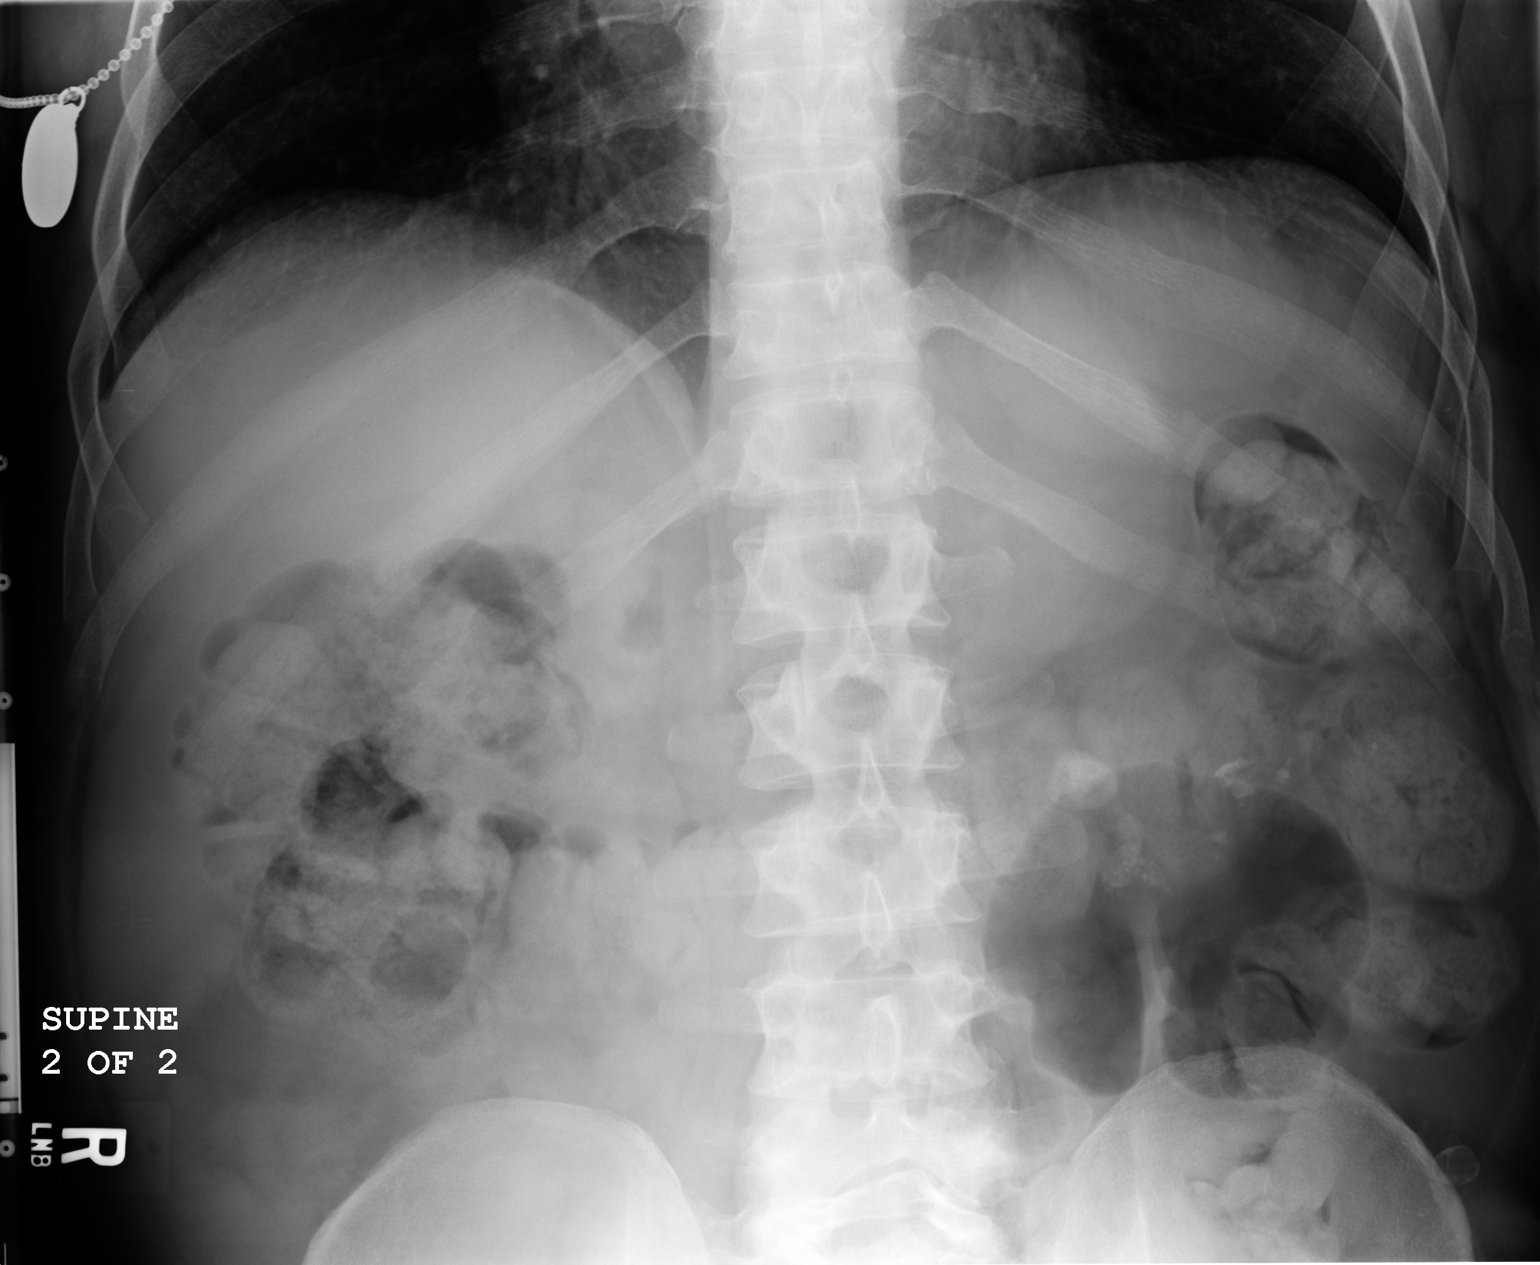

[2 of 2 positions shown; findings below may reference images not displayed]

PROCEDURE:     DXR - DXR KIDNEY URETER BLADDER  - November 10, 2008  [DATE]

RESULT:     Comparison is made to a prior exam of 07/10/2008. The current exam
shows multiple calcifications projected over the region of the right kidney.
The largest measures 2.0 cm in diameter and is projected at the level of the
left renal pelvis. Additionally, there appear to be additional
calcifications present in the lower portion of the left kidney. On the
right, there is a very faint transverse density measuring approximately 6 mm
in diameter and which may represent a lower pole right renal stone or could
be artifactual. Phleboliths are noted in the pelvis bilaterally. There is a
small amount calcification in the region of the prostate gland. No ureteral
calcifications are seen.
IMPRESSION: 1. Left nephrolithiasis.
2. Probable right nephrolithiasis.
3. There is a small amount calcification in the region of the prostate gland.

## 2011-06-10 IMAGING — US US RENAL KIDNEY
1 series · 17 of 25 positions shown · non-contrast
Comparison: none

REASON FOR EXAM: nephrolithiasis
COMMENTS:

[Series 1: us renal kidney · 17 of 44 slices shown]
[im 1/44]
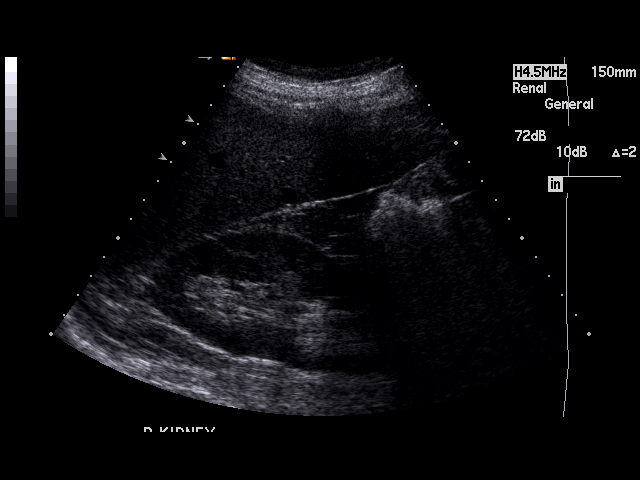
[im 4/44]
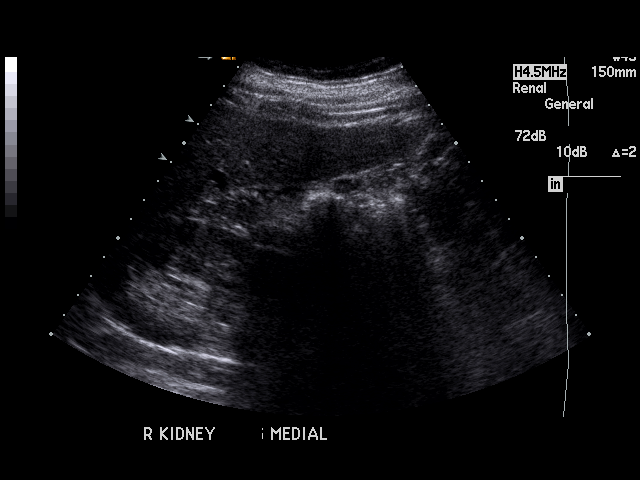
[im 6/44]
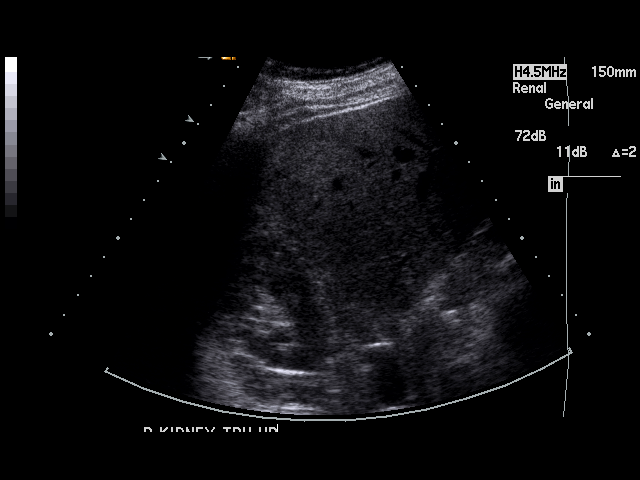
[im 9/44]
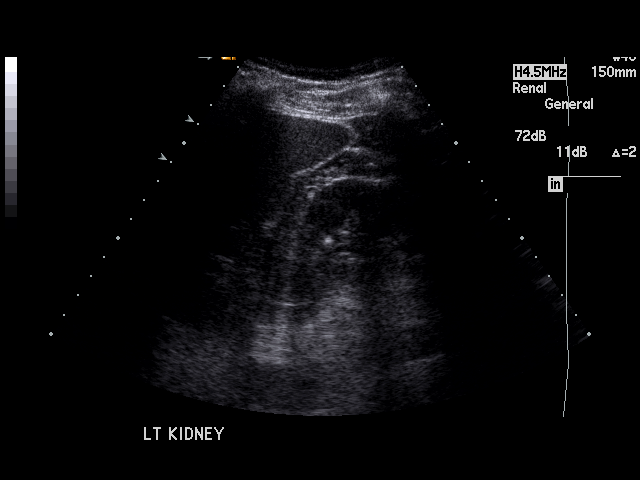
[im 11/44]
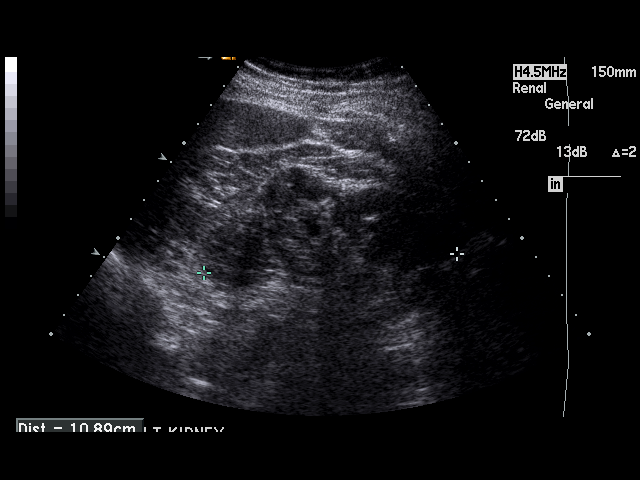
[im 15/44]
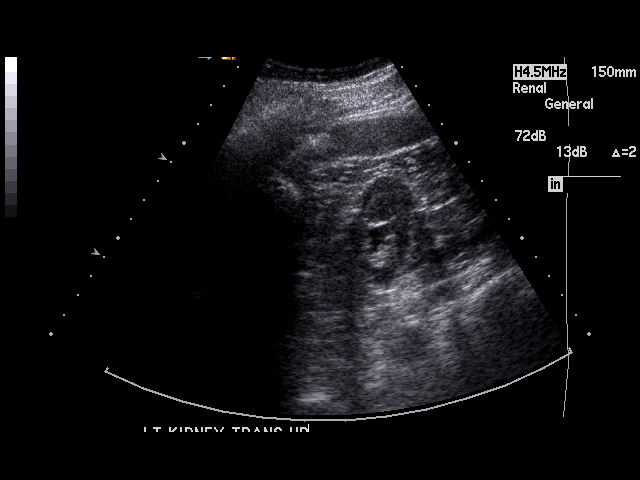
[im 17/44]
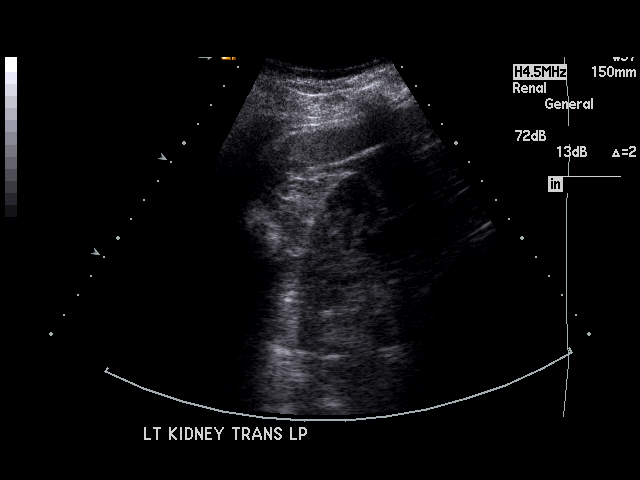
[im 20/44]
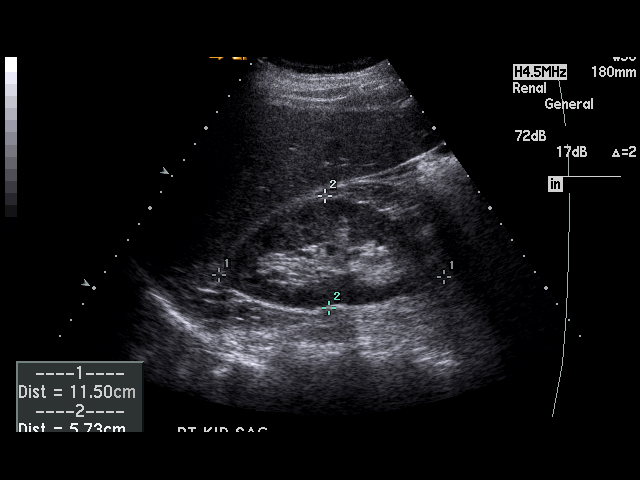
[im 22/44]
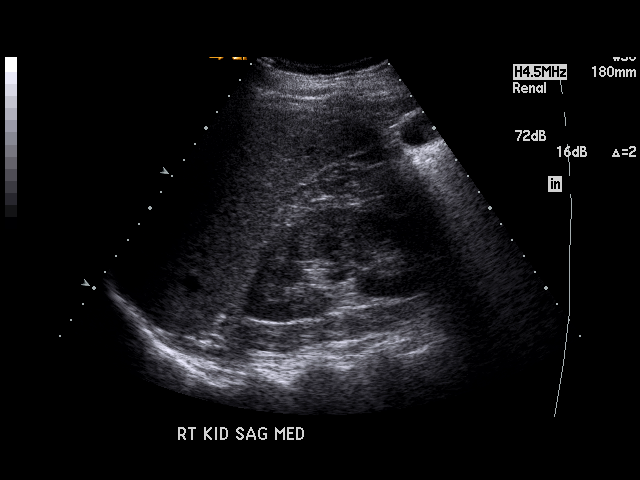
[im 24/44]
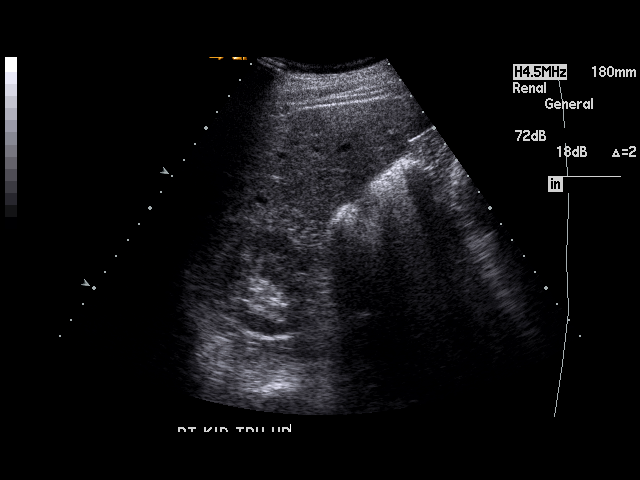
[im 27/44]
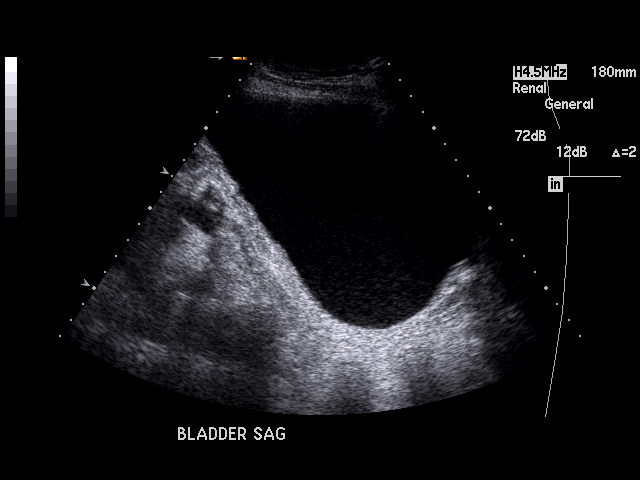
[im 29/44]
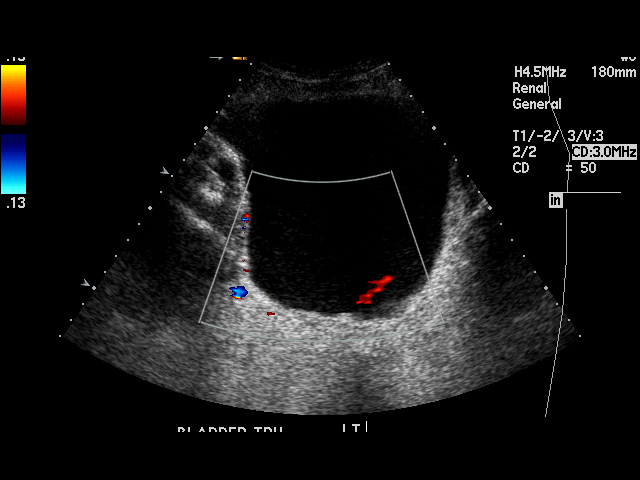
[im 33/44]
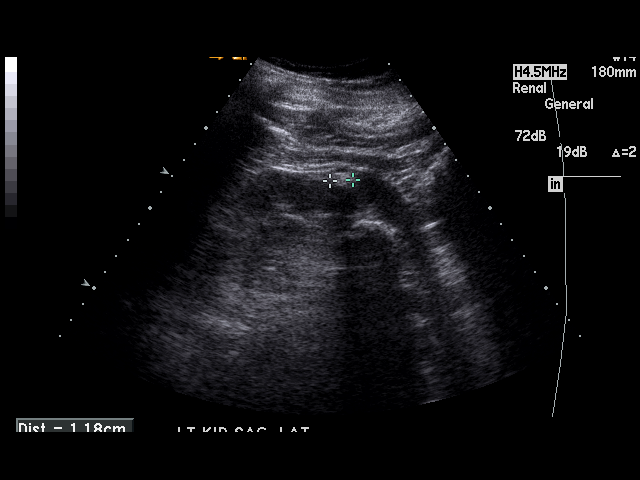
[im 35/44]
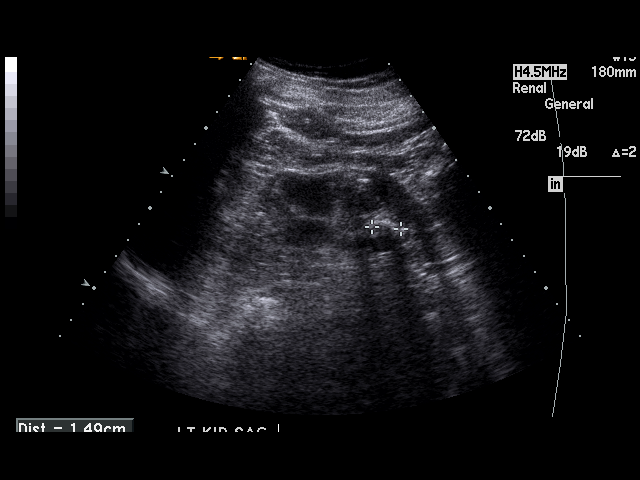
[im 38/44]
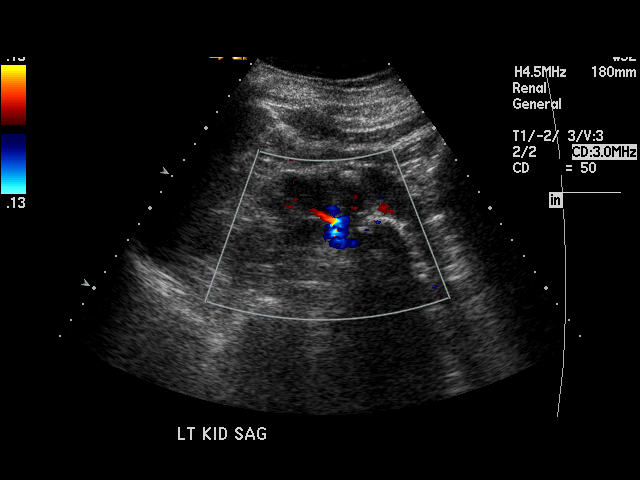
[im 40/44]
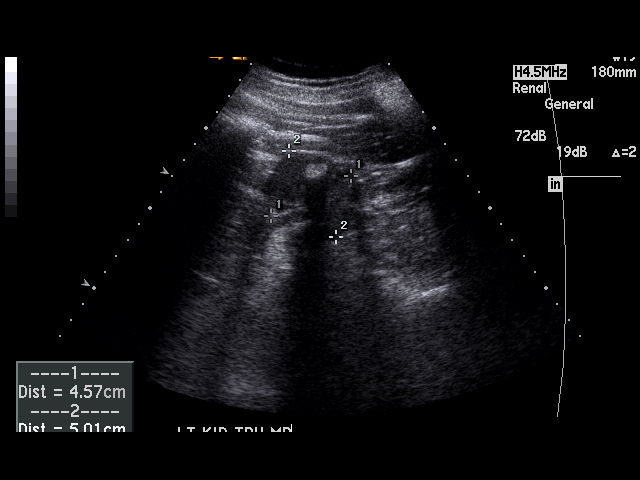
[im 44/44]
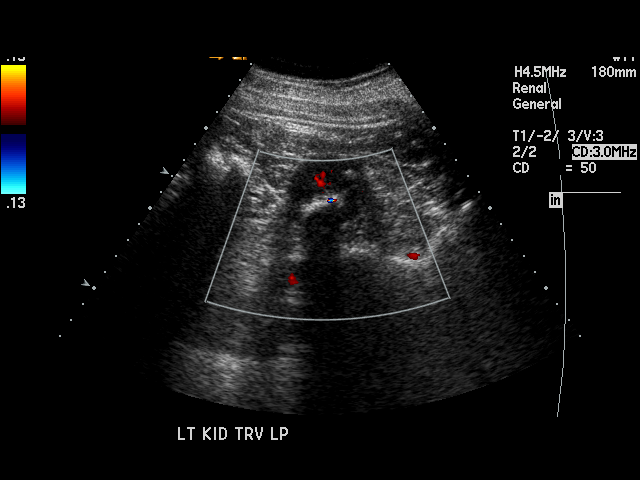

[17 of 25 positions shown; findings below may reference images not displayed]

PROCEDURE:     US  - US KIDNEY  - November 15, 2008 [DATE]

RESULT:     The right kidney measures 11.5 cm x 5.73 cm x 5.36 cm. The left
kidney measures 9.66 cm x 4.24 cm x 4.57 cm. Fetal lobulation is observed on
the left. No solid or cystic renal mass lesions are seen. There are
echodensities at the lower pole of the left kidney compatible with
nonobstructive, left renal stones. No renal stones are identified on the
right sonographically. The urinary bladder appears mildly distended. The
visualized portion of the urinary bladder shows no significant abnormalities.
IMPRESSION: 1.  Left nephrolithiasis.
2.  No hydronephrosis is seen.
3.  No renal mass lesions are noted.

## 2011-07-09 IMAGING — CR DG ABDOMEN 1V
1 series · 1 of 1 positions shown · non-contrast
Comparison: none

REASON FOR EXAM: renal calculi-lithotripsy
COMMENTS:

[view not recorded]
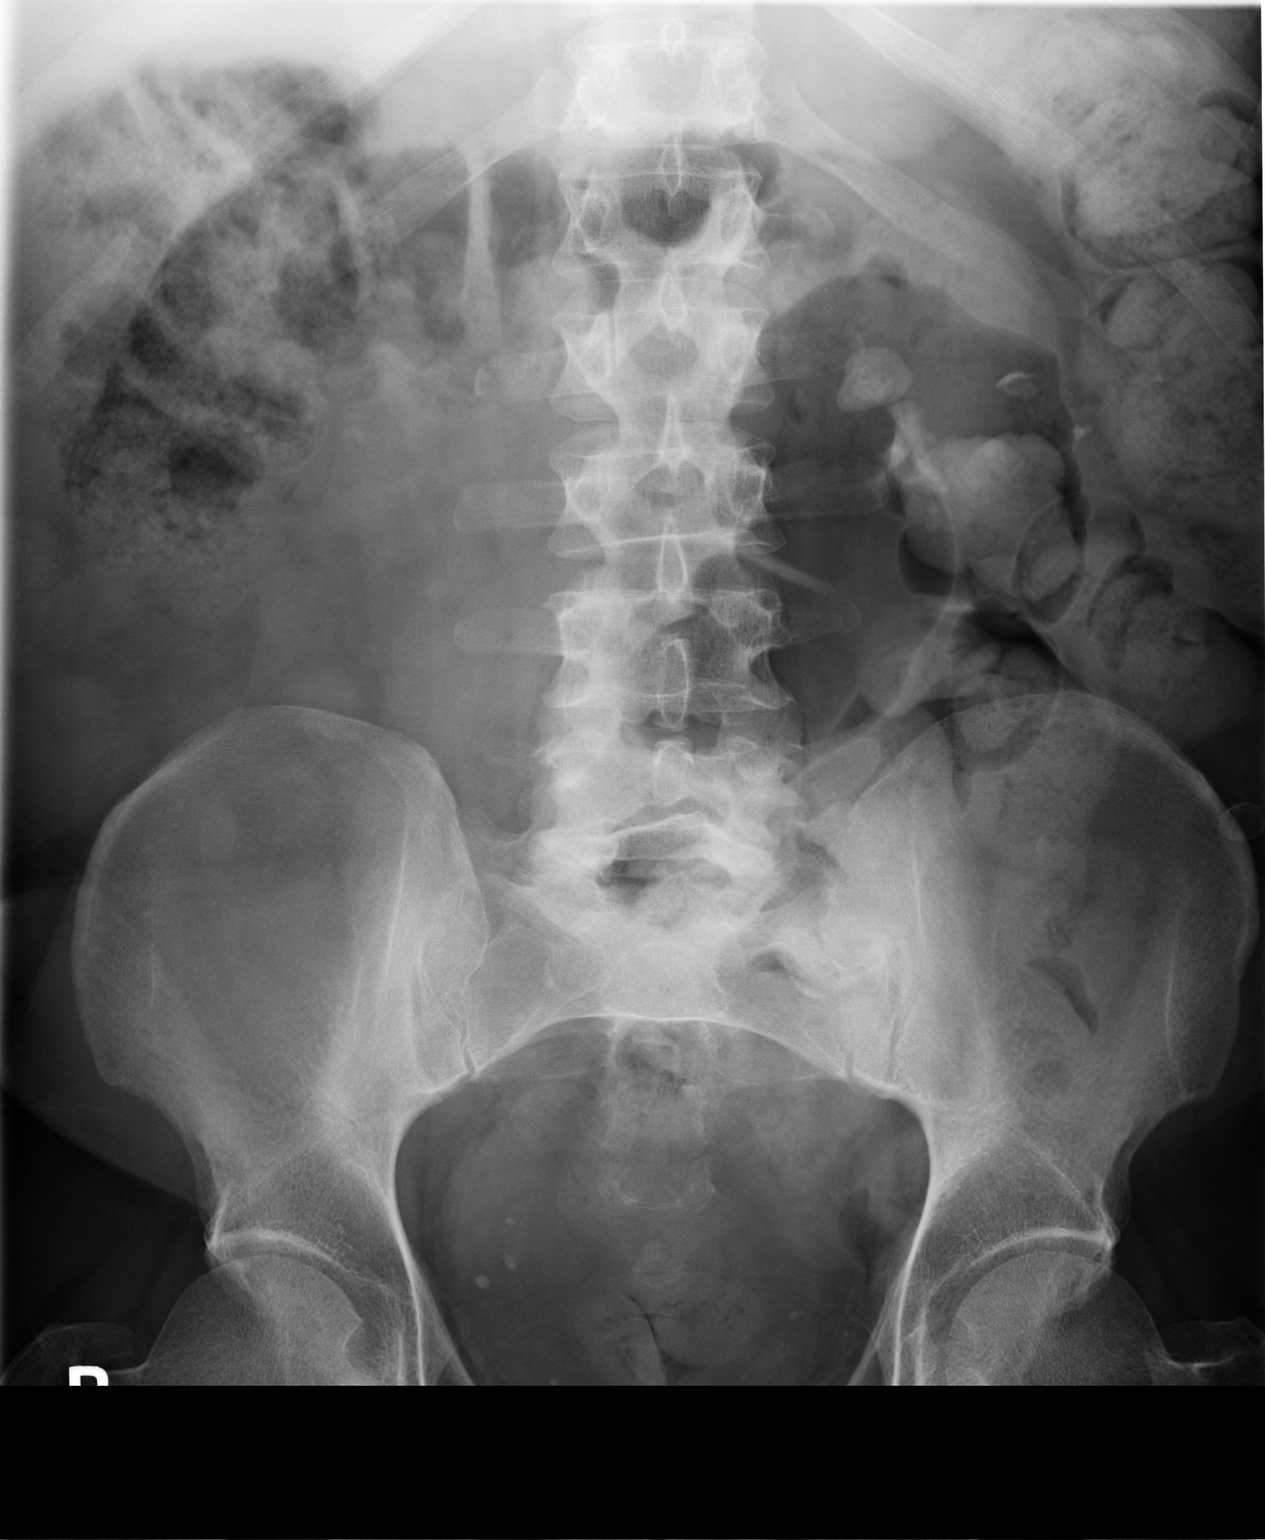

[1 of 1 positions shown; findings below may reference images not displayed]

PROCEDURE:     DXR - DXR KIDNEY URETER BLADDER  - December 14, 2008  [DATE]

RESULT:     Comparison is made to images of 07/10/2008 and 11/10/2008.

Areas of density are present over both renal regions suggestive of bilateral
nephrolithiasis. Bowel content obscures portions of the kidneys. Multiple
phleboliths are present. It would be difficult to exclude distal ureteral
calculi but the pattern appears to be stable over the pelvic region.
IMPRESSION: Please see above.

## 2011-07-21 IMAGING — CR DG ABDOMEN 1V
1 series · 1 of 1 positions shown · non-contrast
Comparison: none

REASON FOR EXAM: NEPHROLITHIASIS
COMMENTS:

[view not recorded]
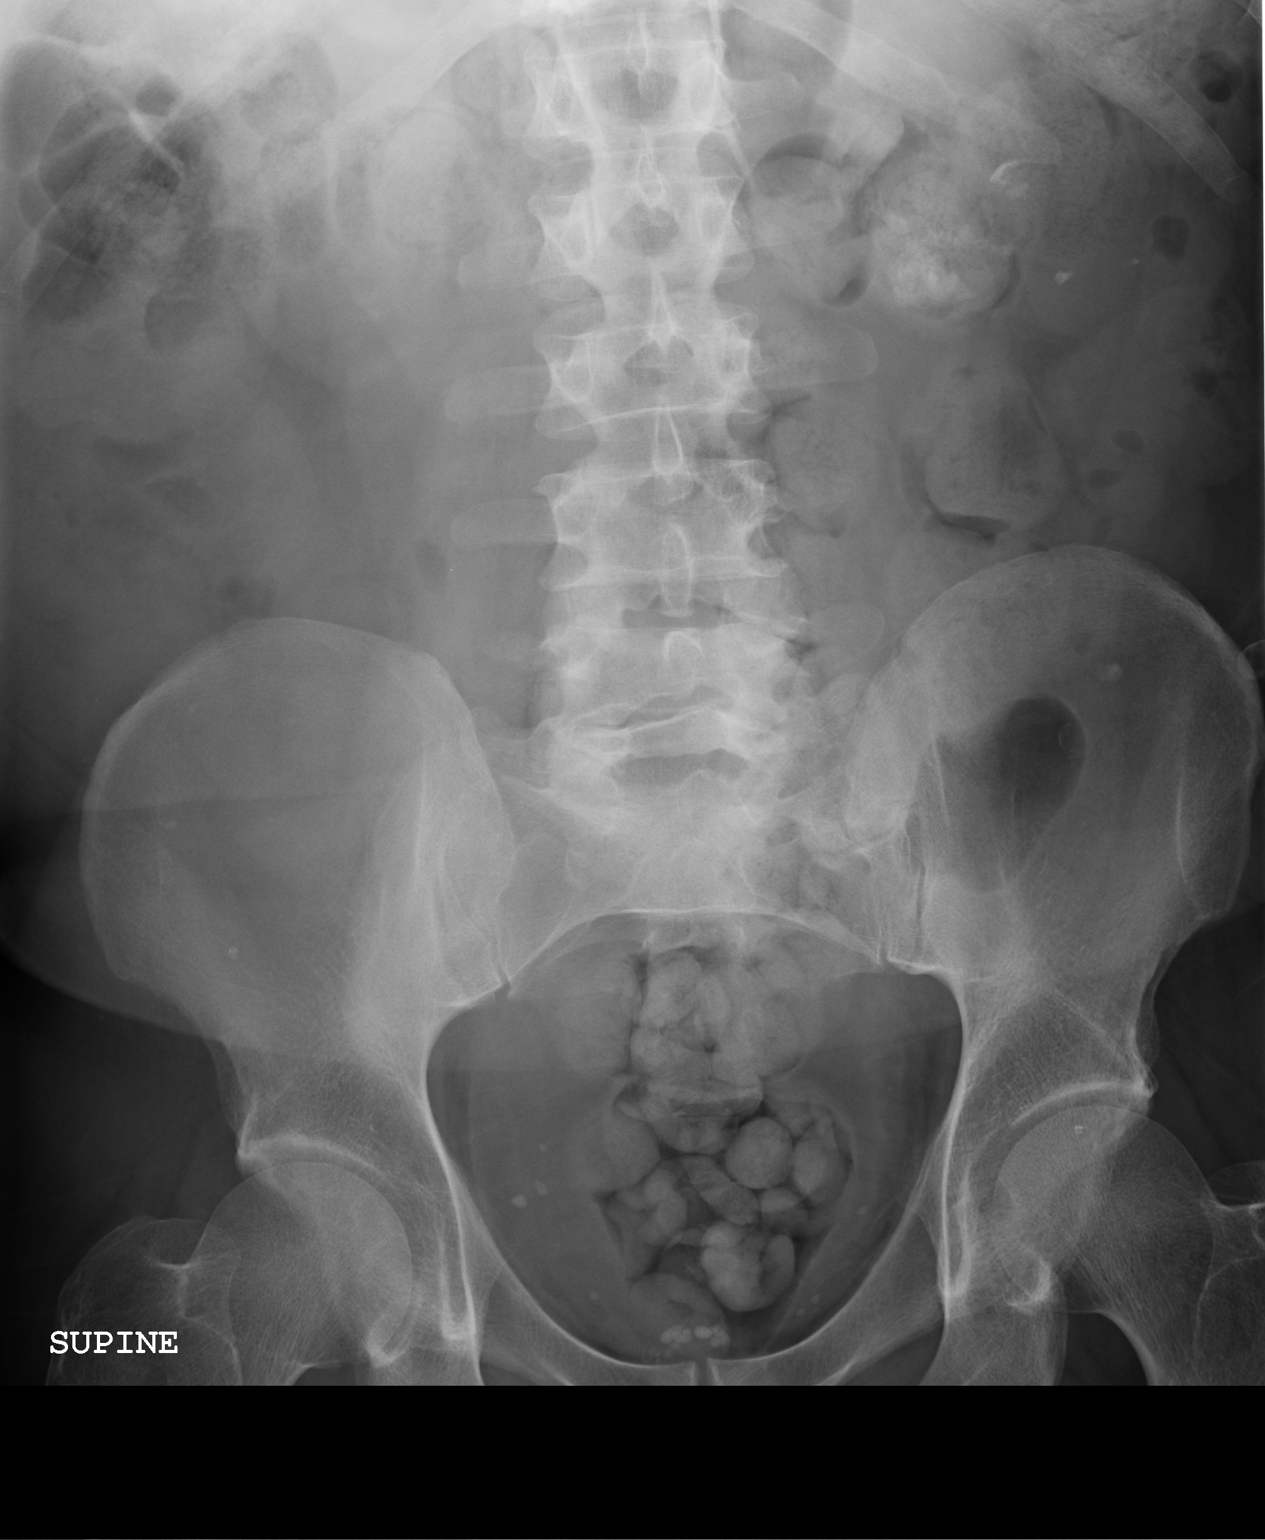

[1 of 1 positions shown; findings below may reference images not displayed]

PROCEDURE:     DXR - DXR KIDNEY URETER BLADDER  - December 26, 2008  [DATE]

RESULT:     Comparison is made to the prior exam of 12/14/2008.

Staghorn calcifications and calcification fragments are again observed on
the left. The previously noted calcification at the lower pole of the left
kidney is now less prominent. The mid portion of the left kidney is obscured
by overlying bowel and bowel content. Phleboliths are noted in the pelvis
bilaterally. Calcification is observed in the region of the prostate. No
definite ureteral calcifications are seen.
IMPRESSION: Nephrolithiasis as noted above.

## 2011-08-20 IMAGING — CR DG ABDOMEN 1V
1 series · 2 of 2 positions shown · non-contrast
Comparison: none

REASON FOR EXAM: renal calculi-lithotripsy
COMMENTS:

[Series 1: view not recorded · 0.17mm/px · 2 of 2 slices shown]
[im 1/2]
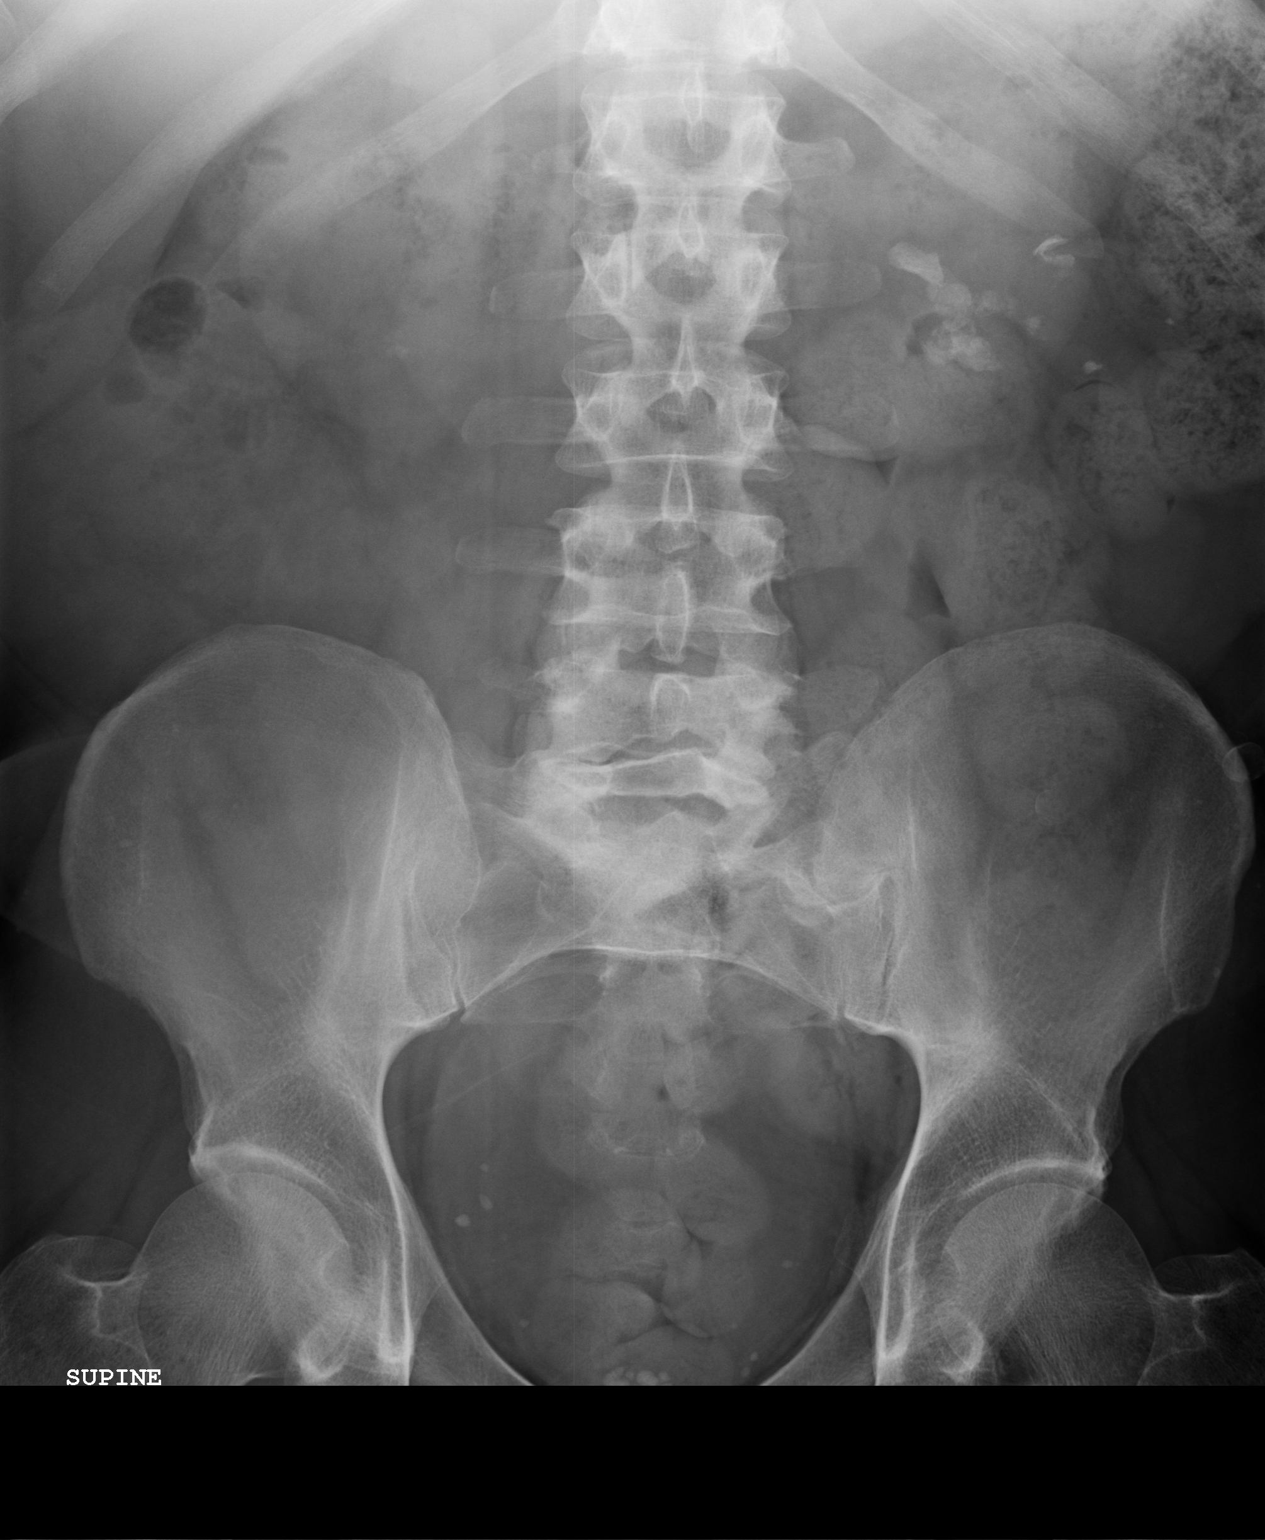
[im 2/2]
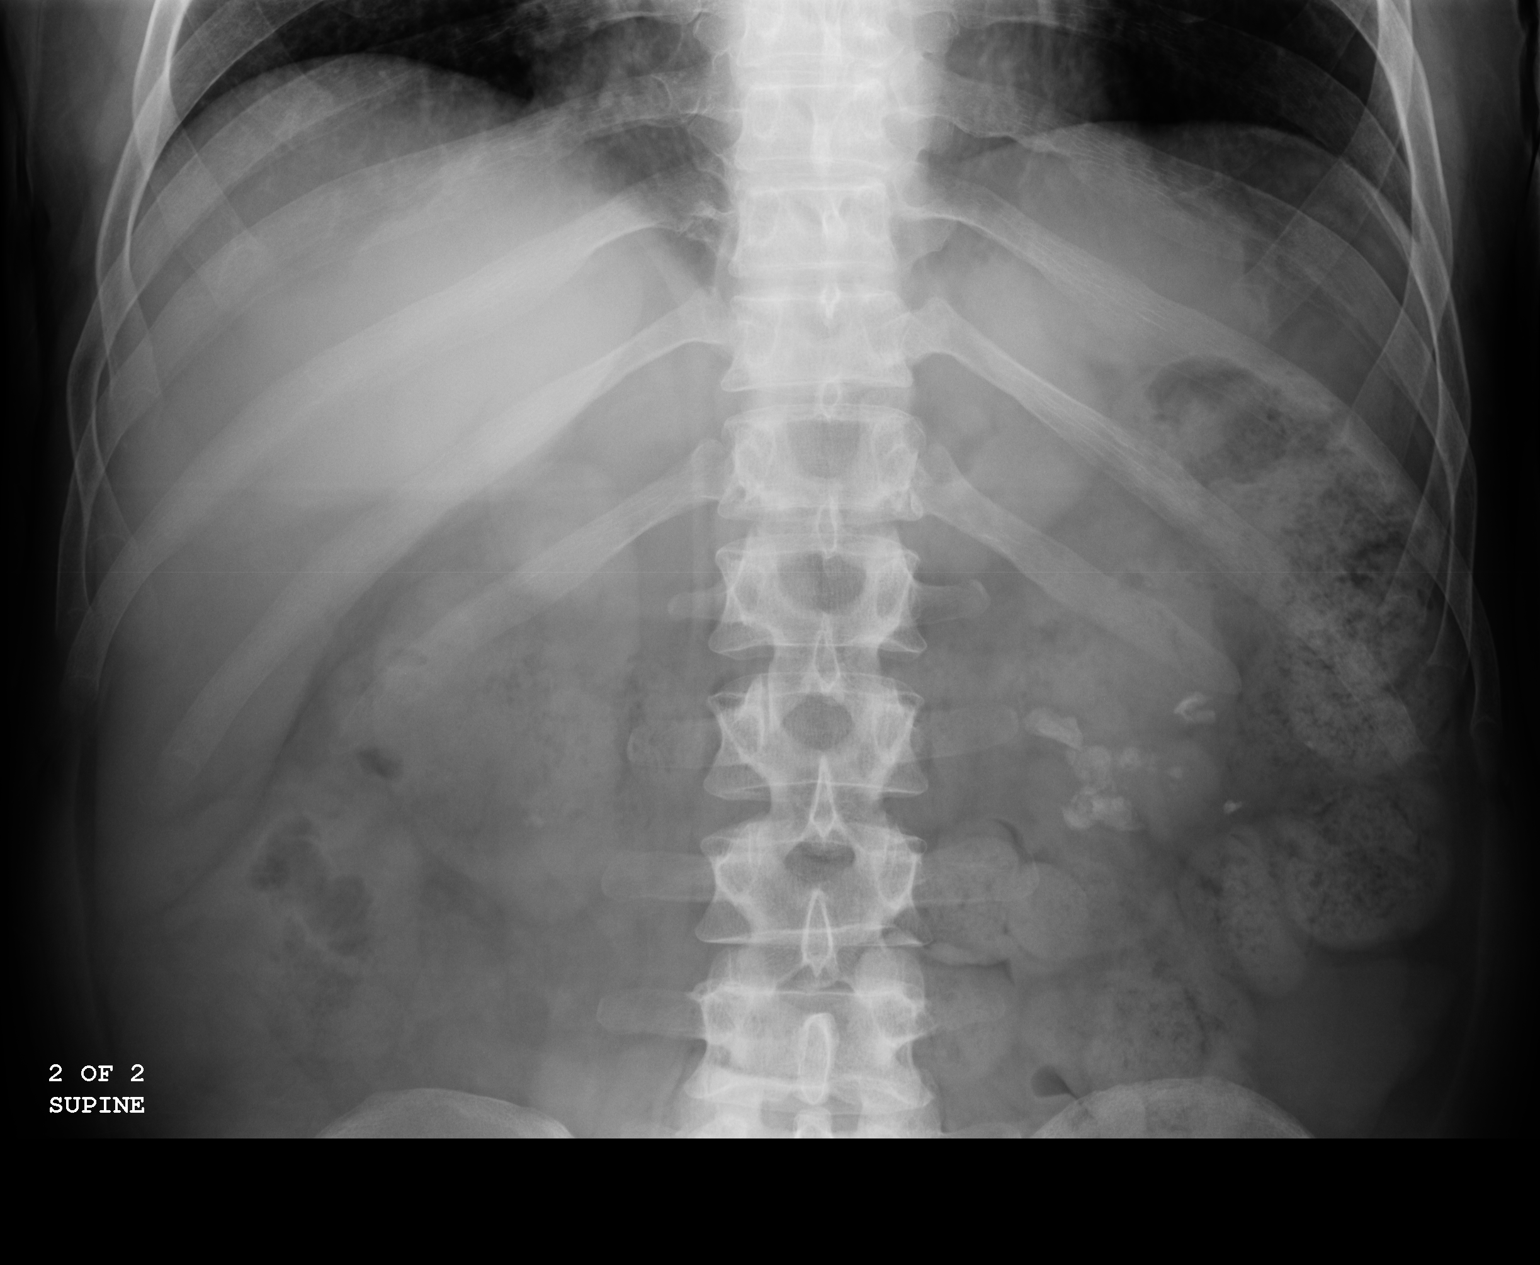

[2 of 2 positions shown; findings below may reference images not displayed]

PROCEDURE:     DXR - DXR KIDNEY URETER BLADDER  - January 25, 2009 [DATE]

RESULT:     Comparison is made to images of 12/26/2008 and 12/14/2008.
Multiple densities project over the left kidney regions consistent with
large calculi and incomplete staghorn formation. Calcific densities over the
pelvis appear to be relatively stable consistent with phleboliths.
Degenerative changes are noted in the lumbosacral region. There is a density
in the lower pole and possibly in the midpole of the right kidney consistent
with stones.
IMPRESSION: 1. Bilateral nephrolithiasis greater on the left.

2. Phleboliths in the pelvic region.

## 2011-09-04 IMAGING — CR DG IVP HYPERTENSIVE
1 series · 8 of 10 positions shown · non-contrast
Comparison: none

REASON FOR EXAM: nephrolithiasis
COMMENTS:

[Series 1: view not recorded · 0.17mm/px · 8 of 14 slices shown]
[im 1/14]
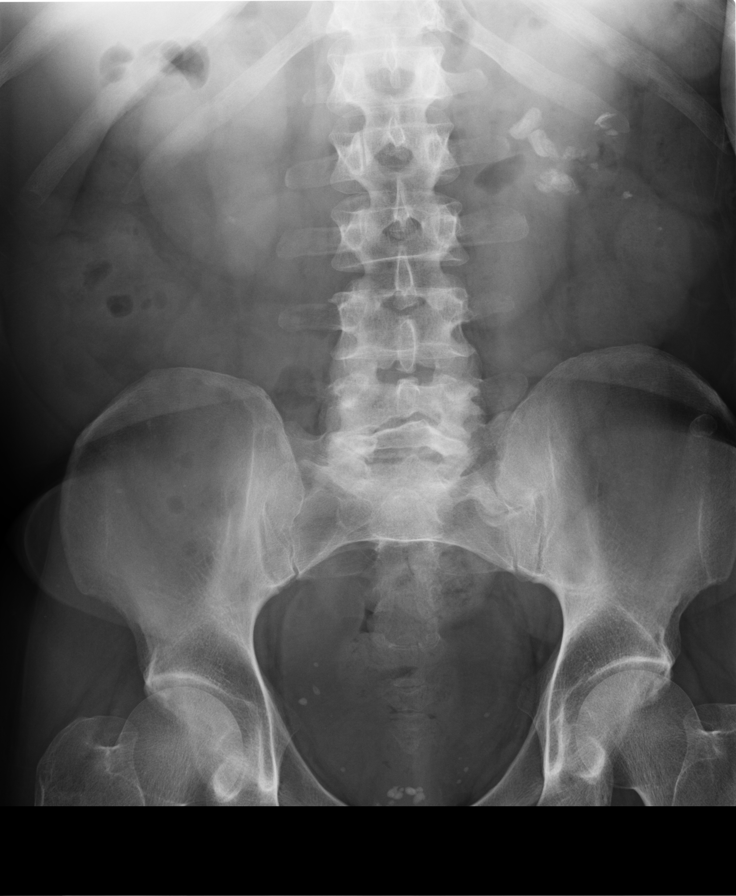
[im 2/14]
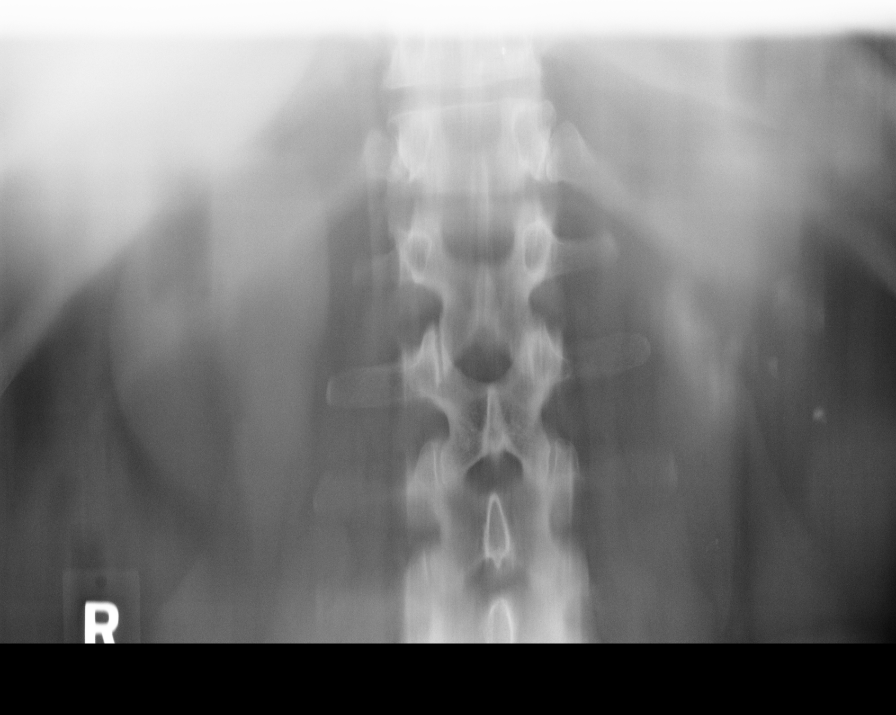
[im 3/14]
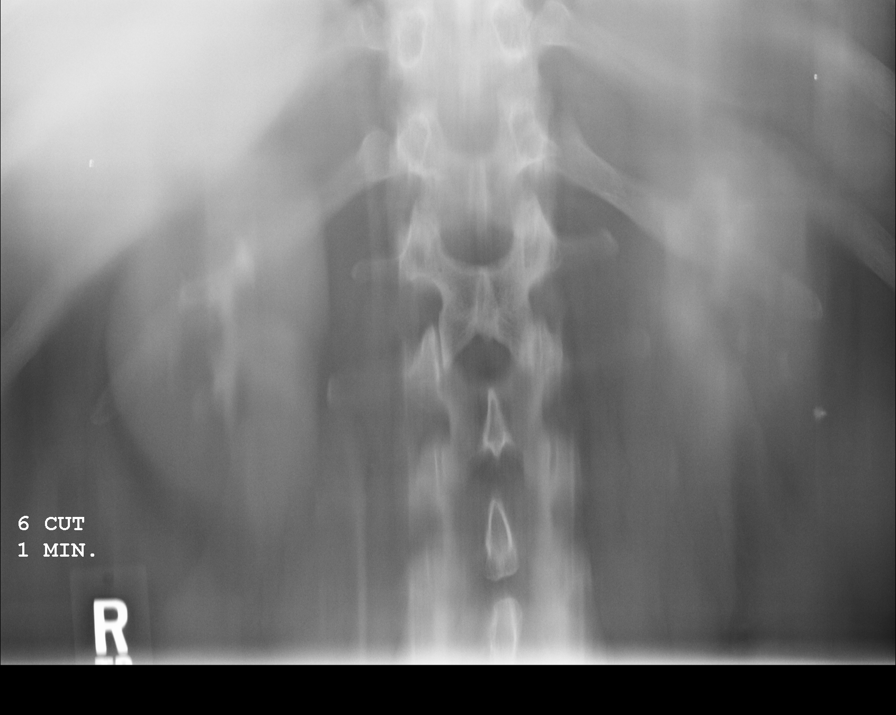
[im 5/14]
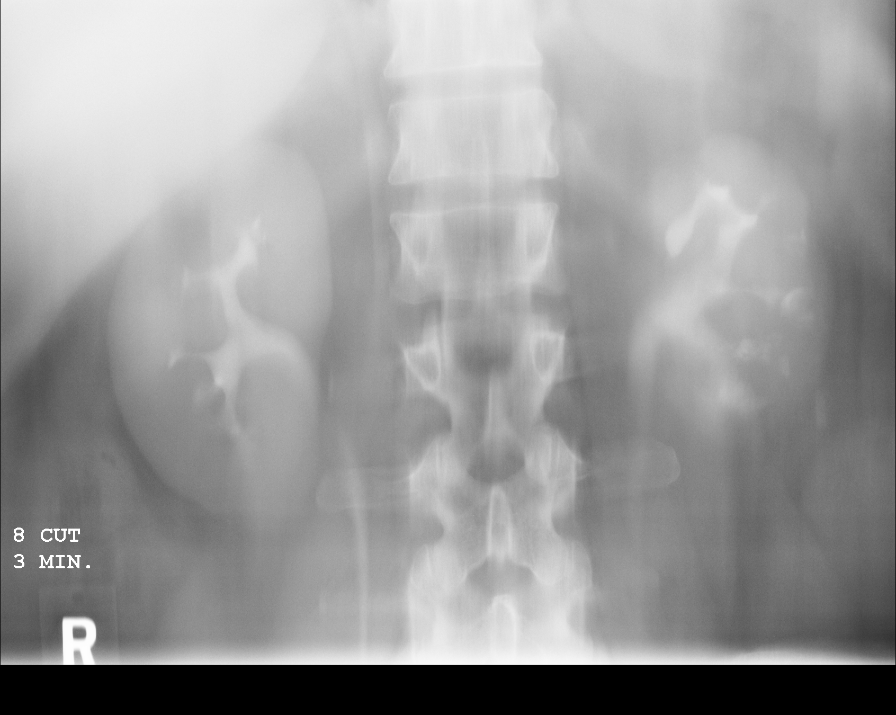
[im 6/14]
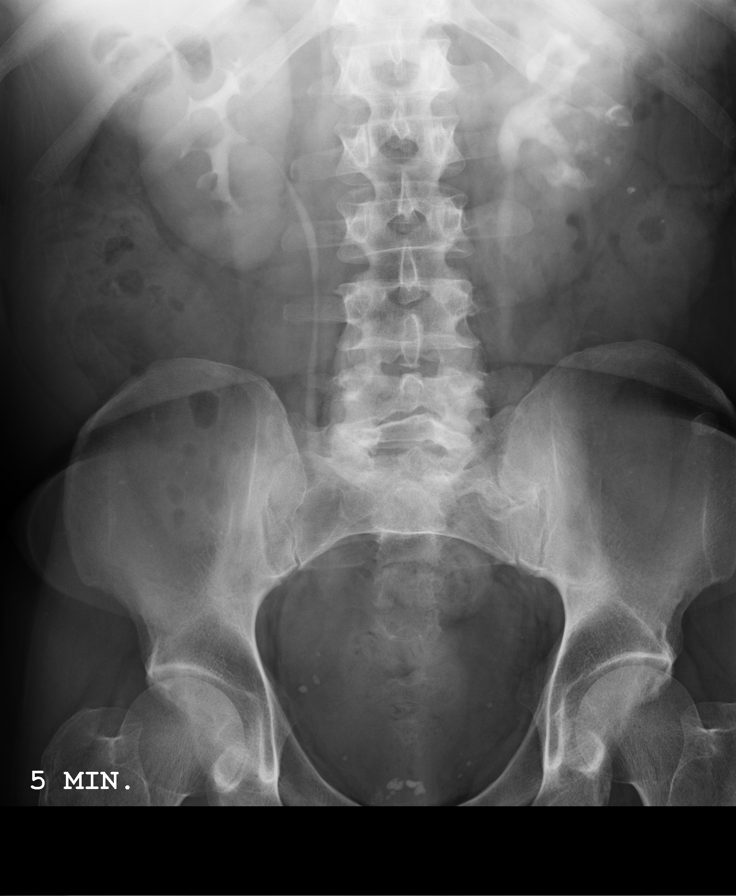
[im 8/14]
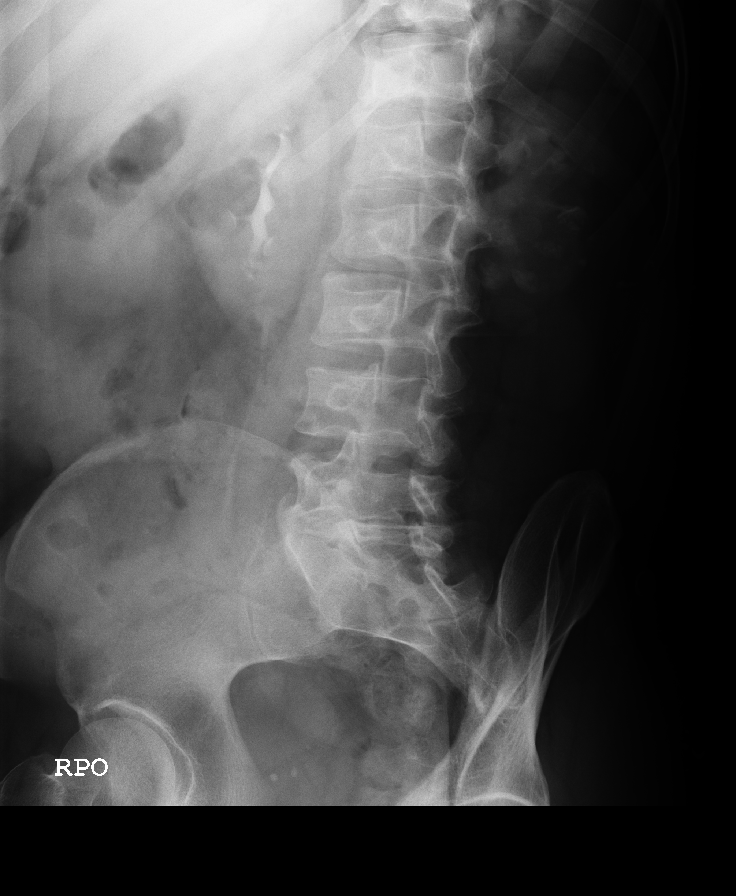
[im 9/14]
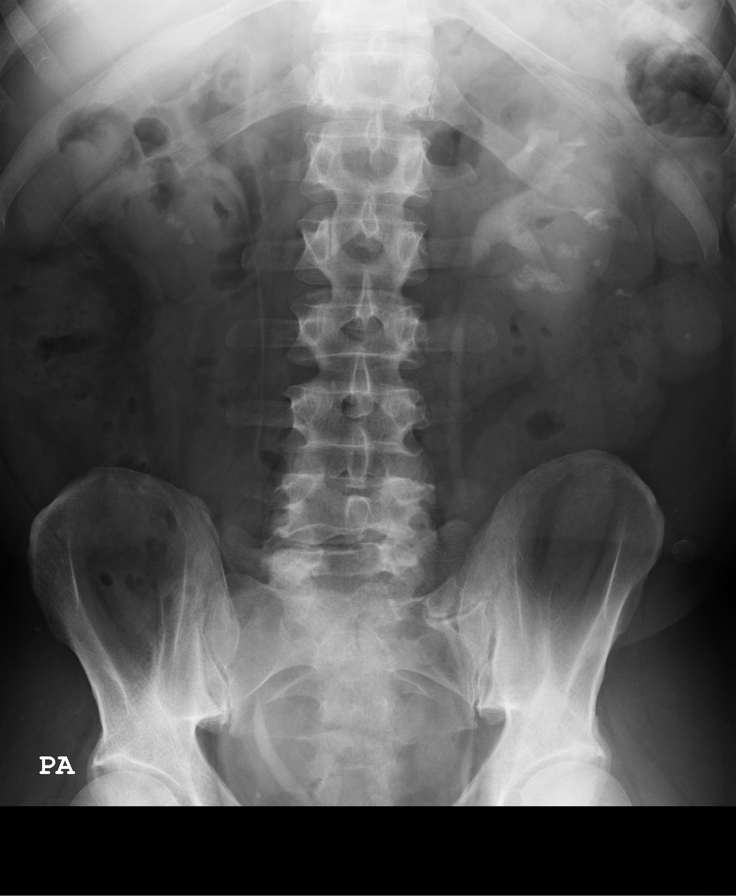
[im 11/14]
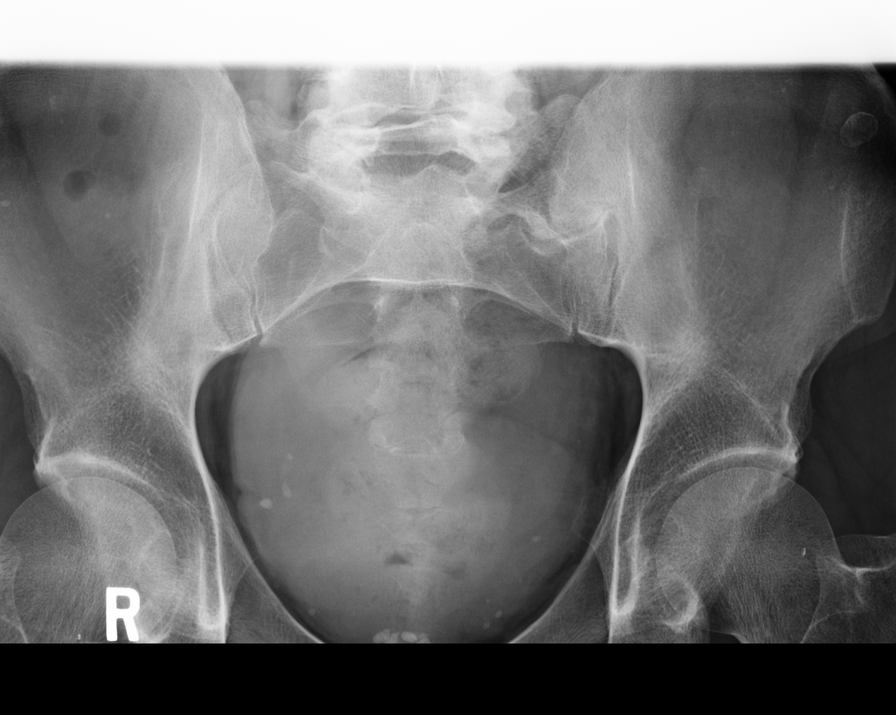

[8 of 10 positions shown; findings below may reference images not displayed]

PROCEDURE:     DXR - DXR INTRAVENOUS UROGRAPHY (IVP)  - February 09, 2009  [DATE]

RESULT:     Scout film reveals multiple stones over the left kidney.
Following administration of contrast, there is prompt bilateral renal
function. The right renal collecting system and ureter are normal. The left
renal collecting system demonstrates multiple blunted calyces and left
nephrolithiasis. Stone burden in the left renal collecting system is severe.
There is severe deformity of the left kidney consistent with scarring. The
patient could not empty his bladder prior to the IVP, therefore, the bladder
is difficult to evaluate but no focal abnormality is identified. Post-void
residual appears moderate.
IMPRESSION: 1. Severe deformity of the left kidney secondary to scarring. Multiple left
renal stones are present. Multiple calyceal blunting is noted. Renal
calyceal changes could be related to the multiple stones and chronic
infection. Papillary necrosis could also present in this fashion. Right
renal collecting system is unobstructed.
2. Incidental note and not mentioned above, is there may be a very tiny
right lower pole renal calyceal stone. Multiple pelvic calcifications noted
consistent with phleboliths. No obstructing distal ureteral stone noted.

## 2011-10-07 ENCOUNTER — Inpatient Hospital Stay: Payer: Self-pay | Admitting: Internal Medicine

## 2011-10-07 LAB — CBC
HGB: 12.3 g/dL — ABNORMAL LOW (ref 13.0–18.0)
MCHC: 33 g/dL (ref 32.0–36.0)
MCV: 83 fL (ref 80–100)
Platelet: 291 10*3/uL (ref 150–440)
RBC: 4.48 10*6/uL (ref 4.40–5.90)
RDW: 16.1 % — ABNORMAL HIGH (ref 11.5–14.5)
WBC: 12.3 10*3/uL — ABNORMAL HIGH (ref 3.8–10.6)

## 2011-10-07 LAB — COMPREHENSIVE METABOLIC PANEL
Albumin: 2.9 g/dL — ABNORMAL LOW (ref 3.4–5.0)
Alkaline Phosphatase: 102 U/L (ref 50–136)
Anion Gap: 6 — ABNORMAL LOW (ref 7–16)
Bilirubin,Total: 0.2 mg/dL (ref 0.2–1.0)
Calcium, Total: 8.9 mg/dL (ref 8.5–10.1)
Chloride: 111 mmol/L — ABNORMAL HIGH (ref 98–107)
Co2: 22 mmol/L (ref 21–32)
EGFR (African American): 38 — ABNORMAL LOW
EGFR (Non-African Amer.): 33 — ABNORMAL LOW
Glucose: 97 mg/dL (ref 65–99)
SGOT(AST): 22 U/L (ref 15–37)
SGPT (ALT): 20 U/L
Sodium: 139 mmol/L (ref 136–145)

## 2011-10-07 LAB — CK TOTAL AND CKMB (NOT AT ARMC)
CK, Total: 79 U/L (ref 35–232)
CK-MB: 0.7 ng/mL (ref 0.5–3.6)
CK-MB: 1 ng/mL (ref 0.5–3.6)

## 2011-10-07 LAB — URINALYSIS, COMPLETE
Bilirubin,UR: NEGATIVE
Blood: NEGATIVE
Glucose,UR: NEGATIVE mg/dL (ref 0–75)
Protein: NEGATIVE
Specific Gravity: 1.01 (ref 1.003–1.030)

## 2011-10-07 LAB — IRON AND TIBC
Iron Bind.Cap.(Total): 292 ug/dL (ref 250–450)
Iron Saturation: 17 %
Unbound Iron-Bind.Cap.: 241 ug/dL

## 2011-10-07 LAB — FERRITIN: Ferritin (ARMC): 30 ng/mL (ref 8–388)

## 2011-10-07 LAB — TSH: Thyroid Stimulating Horm: 3.35 u[IU]/mL

## 2011-10-07 LAB — TROPONIN I
Troponin-I: <0.02 ng/mL
Troponin-I: <0.02 ng/mL

## 2011-10-08 LAB — CBC WITH DIFFERENTIAL/PLATELET
Basophil #: 0.1 10*3/uL (ref 0.0–0.1)
Eosinophil %: 2.6 %
HCT: 33.7 % — ABNORMAL LOW (ref 40.0–52.0)
Lymphocyte %: 13.8 %
MCHC: 33.1 g/dL (ref 32.0–36.0)
Monocyte %: 8.5 %
Neutrophil #: 9 10*3/uL — ABNORMAL HIGH (ref 1.4–6.5)
RBC: 4.04 10*6/uL — ABNORMAL LOW (ref 4.40–5.90)
RDW: 16.1 % — ABNORMAL HIGH (ref 11.5–14.5)
WBC: 12.2 10*3/uL — ABNORMAL HIGH (ref 3.8–10.6)

## 2011-10-08 LAB — BASIC METABOLIC PANEL
Anion Gap: 6 — ABNORMAL LOW (ref 7–16)
BUN: 37 mg/dL — ABNORMAL HIGH (ref 7–18)
Chloride: 112 mmol/L — ABNORMAL HIGH (ref 98–107)
Co2: 23 mmol/L (ref 21–32)
Creatinine: 1.81 mg/dL — ABNORMAL HIGH (ref 0.60–1.30)
Glucose: 88 mg/dL (ref 65–99)
Osmolality: 289 (ref 275–301)
Potassium: 4 mmol/L (ref 3.5–5.1)
Sodium: 141 mmol/L (ref 136–145)

## 2011-10-08 LAB — URINE CULTURE

## 2011-10-09 LAB — BASIC METABOLIC PANEL
Anion Gap: 8 (ref 7–16)
Calcium, Total: 8.4 mg/dL — ABNORMAL LOW (ref 8.5–10.1)
Chloride: 110 mmol/L — ABNORMAL HIGH (ref 98–107)
Co2: 22 mmol/L (ref 21–32)
Creatinine: 1.89 mg/dL — ABNORMAL HIGH (ref 0.60–1.30)
EGFR (African American): 45 — ABNORMAL LOW
EGFR (Non-African Amer.): 39 — ABNORMAL LOW
Osmolality: 284 (ref 275–301)
Potassium: 4.3 mmol/L (ref 3.5–5.1)
Sodium: 140 mmol/L (ref 136–145)

## 2012-03-26 ENCOUNTER — Inpatient Hospital Stay: Payer: Self-pay | Admitting: Surgery

## 2012-03-26 LAB — CBC WITH DIFFERENTIAL/PLATELET
Basophil #: 0.1 10*3/uL (ref 0.0–0.1)
HCT: 26.7 % — ABNORMAL LOW (ref 40.0–52.0)
Lymphocyte #: 1.7 10*3/uL (ref 1.0–3.6)
Lymphocyte %: 14 %
MCHC: 31.9 g/dL — ABNORMAL LOW (ref 32.0–36.0)
MCV: 75 fL — ABNORMAL LOW (ref 80–100)
Neutrophil #: 8.4 10*3/uL — ABNORMAL HIGH (ref 1.4–6.5)
RDW: 16.2 % — ABNORMAL HIGH (ref 11.5–14.5)

## 2012-03-26 LAB — URINALYSIS, COMPLETE
Protein: 30
RBC,UR: 159 /HPF (ref 0–5)
Specific Gravity: 1.008 (ref 1.003–1.030)
WBC UR: 1966 /HPF (ref 0–5)

## 2012-03-26 LAB — COMPREHENSIVE METABOLIC PANEL
Albumin: 2.1 g/dL — ABNORMAL LOW (ref 3.4–5.0)
BUN: 44 mg/dL — ABNORMAL HIGH (ref 7–18)
Bilirubin,Total: 0.2 mg/dL (ref 0.2–1.0)
Glucose: 96 mg/dL (ref 65–99)
Osmolality: 277 (ref 275–301)
SGOT(AST): 18 U/L (ref 15–37)
SGPT (ALT): 28 U/L (ref 12–78)
Total Protein: 6.4 g/dL (ref 6.4–8.2)

## 2012-03-26 LAB — APTT: Activated PTT: 36.4 secs — ABNORMAL HIGH (ref 23.6–35.9)

## 2012-03-26 LAB — PROTIME-INR: Prothrombin Time: 14.2 secs (ref 11.5–14.7)

## 2012-03-27 LAB — BASIC METABOLIC PANEL
Anion Gap: 9 (ref 7–16)
BUN: 34 mg/dL — ABNORMAL HIGH (ref 7–18)
Calcium, Total: 8.6 mg/dL (ref 8.5–10.1)
EGFR (African American): 41 — ABNORMAL LOW
EGFR (Non-African Amer.): 35 — ABNORMAL LOW
Glucose: 92 mg/dL (ref 65–99)
Osmolality: 287 (ref 275–301)
Potassium: 4.3 mmol/L (ref 3.5–5.1)

## 2012-03-27 LAB — CBC WITH DIFFERENTIAL/PLATELET
Basophil %: 1 %
Eosinophil %: 3.5 %
HCT: 25.5 % — ABNORMAL LOW (ref 40.0–52.0)
HGB: 8.6 g/dL — ABNORMAL LOW (ref 13.0–18.0)
MCH: 25.5 pg — ABNORMAL LOW (ref 26.0–34.0)
MCV: 76 fL — ABNORMAL LOW (ref 80–100)
Monocyte #: 1.2 x10 3/mm — ABNORMAL HIGH (ref 0.2–1.0)
Neutrophil #: 4.1 10*3/uL (ref 1.4–6.5)
Neutrophil %: 57.5 %
RBC: 3.37 10*6/uL — ABNORMAL LOW (ref 4.40–5.90)

## 2012-03-29 LAB — CBC WITH DIFFERENTIAL/PLATELET
Bands: 2 %
Comment - H1-Com4: NORMAL
Eosinophil: 5 %
HCT: 26.8 % — ABNORMAL LOW (ref 40.0–52.0)
HGB: 9 g/dL — ABNORMAL LOW (ref 13.0–18.0)
Lymphocytes: 20 %
MCH: 25.6 pg — ABNORMAL LOW (ref 26.0–34.0)
MCHC: 33.6 g/dL (ref 32.0–36.0)
MCV: 76 fL — ABNORMAL LOW (ref 80–100)
Metamyelocyte: 3 %
Monocytes: 3 %
Myelocyte: 4 %
Platelet: 413 10*3/uL (ref 150–440)
RBC: 3.52 10*6/uL — ABNORMAL LOW (ref 4.40–5.90)
RDW: 16.2 % — ABNORMAL HIGH (ref 11.5–14.5)
Segmented Neutrophils: 62 %
WBC: 9.8 10*3/uL (ref 3.8–10.6)

## 2012-03-29 LAB — BASIC METABOLIC PANEL
Anion Gap: 7 (ref 7–16)
BUN: 19 mg/dL — ABNORMAL HIGH (ref 7–18)
Calcium, Total: 8.3 mg/dL — ABNORMAL LOW (ref 8.5–10.1)
Chloride: 112 mmol/L — ABNORMAL HIGH (ref 98–107)
Co2: 21 mmol/L (ref 21–32)
Creatinine: 2 mg/dL — ABNORMAL HIGH (ref 0.60–1.30)
EGFR (African American): 42 — ABNORMAL LOW
EGFR (Non-African Amer.): 36 — ABNORMAL LOW
Glucose: 88 mg/dL (ref 65–99)
Osmolality: 281 (ref 275–301)
Potassium: 4.7 mmol/L (ref 3.5–5.1)
Sodium: 140 mmol/L (ref 136–145)

## 2012-03-30 LAB — BODY FLUID CULTURE

## 2012-04-13 DIAGNOSIS — N312 Flaccid neuropathic bladder, not elsewhere classified: Secondary | ICD-10-CM | POA: Insufficient documentation

## 2012-04-13 DIAGNOSIS — N200 Calculus of kidney: Secondary | ICD-10-CM | POA: Insufficient documentation

## 2012-05-05 ENCOUNTER — Inpatient Hospital Stay: Payer: Self-pay | Admitting: Specialist

## 2012-05-05 LAB — COMPREHENSIVE METABOLIC PANEL
Albumin: 2.9 g/dL — ABNORMAL LOW (ref 3.4–5.0)
Anion Gap: 12 (ref 7–16)
BUN: 71 mg/dL — ABNORMAL HIGH (ref 7–18)
Calcium, Total: 9.4 mg/dL (ref 8.5–10.1)
Creatinine: 3.15 mg/dL — ABNORMAL HIGH (ref 0.60–1.30)
Osmolality: 288 (ref 275–301)
Potassium: 5.2 mmol/L — ABNORMAL HIGH (ref 3.5–5.1)
Sodium: 133 mmol/L — ABNORMAL LOW (ref 136–145)
Total Protein: 7.4 g/dL (ref 6.4–8.2)

## 2012-05-05 LAB — URINALYSIS, COMPLETE
Ketone: NEGATIVE
Nitrite: NEGATIVE
Protein: NEGATIVE
Specific Gravity: 1.005 (ref 1.003–1.030)
WBC UR: 15 /HPF (ref 0–5)

## 2012-05-05 LAB — CBC
HCT: 33.2 % — ABNORMAL LOW (ref 40.0–52.0)
HGB: 10.8 g/dL — ABNORMAL LOW (ref 13.0–18.0)
MCHC: 32.7 g/dL (ref 32.0–36.0)
MCV: 77 fL — ABNORMAL LOW (ref 80–100)
RDW: 19.5 % — ABNORMAL HIGH (ref 11.5–14.5)

## 2012-05-05 LAB — DRUG SCREEN, URINE
Barbiturates, Ur Screen: NEGATIVE (ref ?–200)
Benzodiazepine, Ur Scrn: NEGATIVE (ref ?–200)
Cocaine Metabolite,Ur ~~LOC~~: NEGATIVE (ref ?–300)
Methadone, Ur Screen: NEGATIVE (ref ?–300)
Tricyclic, Ur Screen: NEGATIVE (ref ?–1000)

## 2012-05-05 LAB — LIPASE, BLOOD: Lipase: 111 U/L (ref 73–393)

## 2012-05-06 LAB — BASIC METABOLIC PANEL
Anion Gap: 11 (ref 7–16)
BUN: 82 mg/dL — ABNORMAL HIGH (ref 7–18)
Calcium, Total: 9 mg/dL (ref 8.5–10.1)
Chloride: 109 mmol/L — ABNORMAL HIGH (ref 98–107)
EGFR (Non-African Amer.): 14 — ABNORMAL LOW
Glucose: 103 mg/dL — ABNORMAL HIGH (ref 65–99)
Osmolality: 295 (ref 275–301)

## 2012-05-06 LAB — CBC WITH DIFFERENTIAL/PLATELET
Basophil #: 0 10*3/uL (ref 0.0–0.1)
Eosinophil #: 0 10*3/uL (ref 0.0–0.7)
Lymphocyte #: 0.7 10*3/uL — ABNORMAL LOW (ref 1.0–3.6)
MCH: 25.8 pg — ABNORMAL LOW (ref 26.0–34.0)
MCHC: 32.9 g/dL (ref 32.0–36.0)
MCV: 78 fL — ABNORMAL LOW (ref 80–100)
Neutrophil #: 25.2 10*3/uL — ABNORMAL HIGH (ref 1.4–6.5)
Platelet: 355 10*3/uL (ref 150–440)
RDW: 19.4 % — ABNORMAL HIGH (ref 11.5–14.5)
WBC: 27 10*3/uL — ABNORMAL HIGH (ref 3.8–10.6)

## 2012-05-07 LAB — CBC WITH DIFFERENTIAL/PLATELET
Basophil #: 0 10*3/uL (ref 0.0–0.1)
HGB: 8.8 g/dL — ABNORMAL LOW (ref 13.0–18.0)
Lymphocyte %: 5.1 %
MCH: 24.9 pg — ABNORMAL LOW (ref 26.0–34.0)
MCV: 77 fL — ABNORMAL LOW (ref 80–100)
Monocyte %: 5.4 %
Neutrophil %: 88.6 %
Platelet: 326 10*3/uL (ref 150–440)
RBC: 3.52 10*6/uL — ABNORMAL LOW (ref 4.40–5.90)
RDW: 19.7 % — ABNORMAL HIGH (ref 11.5–14.5)

## 2012-05-07 LAB — COMPREHENSIVE METABOLIC PANEL
Albumin: 2.1 g/dL — ABNORMAL LOW (ref 3.4–5.0)
Alkaline Phosphatase: 111 U/L (ref 50–136)
Anion Gap: 11 (ref 7–16)
Bilirubin,Total: 0.2 mg/dL (ref 0.2–1.0)
Calcium, Total: 8.9 mg/dL (ref 8.5–10.1)
Co2: 16 mmol/L — ABNORMAL LOW (ref 21–32)
Creatinine: 4.18 mg/dL — ABNORMAL HIGH (ref 0.60–1.30)
Glucose: 101 mg/dL — ABNORMAL HIGH (ref 65–99)
Osmolality: 298 (ref 275–301)
Potassium: 4.2 mmol/L (ref 3.5–5.1)
Sodium: 137 mmol/L (ref 136–145)

## 2012-05-08 ENCOUNTER — Ambulatory Visit: Payer: Self-pay | Admitting: Urology

## 2012-05-08 LAB — BASIC METABOLIC PANEL
Anion Gap: 8 (ref 7–16)
Calcium, Total: 8.6 mg/dL (ref 8.5–10.1)
Chloride: 111 mmol/L — ABNORMAL HIGH (ref 98–107)
Creatinine: 2.64 mg/dL — ABNORMAL HIGH (ref 0.60–1.30)
Glucose: 77 mg/dL (ref 65–99)
Osmolality: 292 (ref 275–301)
Potassium: 4.3 mmol/L (ref 3.5–5.1)
Sodium: 139 mmol/L (ref 136–145)

## 2012-05-08 LAB — URINE CULTURE

## 2012-05-08 LAB — CBC WITH DIFFERENTIAL/PLATELET
Basophil %: 0.7 %
Eosinophil #: 0.2 10*3/uL (ref 0.0–0.7)
HCT: 26.4 % — ABNORMAL LOW (ref 40.0–52.0)
HGB: 8.6 g/dL — ABNORMAL LOW (ref 13.0–18.0)
Lymphocyte %: 15 %
MCHC: 32.7 g/dL (ref 32.0–36.0)
Neutrophil #: 6.8 10*3/uL — ABNORMAL HIGH (ref 1.4–6.5)
WBC: 9.3 10*3/uL (ref 3.8–10.6)

## 2012-05-09 LAB — BASIC METABOLIC PANEL
Anion Gap: 10 (ref 7–16)
BUN: 44 mg/dL — ABNORMAL HIGH (ref 7–18)
Chloride: 110 mmol/L — ABNORMAL HIGH (ref 98–107)
Creatinine: 2.27 mg/dL — ABNORMAL HIGH (ref 0.60–1.30)
EGFR (African American): 36 — ABNORMAL LOW
EGFR (Non-African Amer.): 31 — ABNORMAL LOW
Glucose: 79 mg/dL (ref 65–99)
Osmolality: 290 (ref 275–301)

## 2012-05-09 LAB — CBC WITH DIFFERENTIAL/PLATELET
Basophil #: 0.1 10*3/uL (ref 0.0–0.1)
Basophil %: 1.1 %
HCT: 29.8 % — ABNORMAL LOW (ref 40.0–52.0)
Lymphocyte #: 1.1 10*3/uL (ref 1.0–3.6)
Lymphocyte %: 13.4 %
MCHC: 33.3 g/dL (ref 32.0–36.0)
MCV: 77 fL — ABNORMAL LOW (ref 80–100)
Monocyte %: 8.7 %
Neutrophil #: 6.3 10*3/uL (ref 1.4–6.5)
RDW: 19.7 % — ABNORMAL HIGH (ref 11.5–14.5)
WBC: 8.5 10*3/uL (ref 3.8–10.6)

## 2012-05-10 LAB — BASIC METABOLIC PANEL
Anion Gap: 6 — ABNORMAL LOW (ref 7–16)
BUN: 41 mg/dL — ABNORMAL HIGH (ref 7–18)
Chloride: 113 mmol/L — ABNORMAL HIGH (ref 98–107)
Co2: 20 mmol/L — ABNORMAL LOW (ref 21–32)
Creatinine: 1.98 mg/dL — ABNORMAL HIGH (ref 0.60–1.30)
EGFR (African American): 42 — ABNORMAL LOW
Potassium: 4.6 mmol/L (ref 3.5–5.1)
Sodium: 139 mmol/L (ref 136–145)

## 2012-05-11 LAB — BASIC METABOLIC PANEL
Chloride: 111 mmol/L — ABNORMAL HIGH (ref 98–107)
Co2: 20 mmol/L — ABNORMAL LOW (ref 21–32)
Creatinine: 2.03 mg/dL — ABNORMAL HIGH (ref 0.60–1.30)
EGFR (African American): 41 — ABNORMAL LOW
Potassium: 4.8 mmol/L (ref 3.5–5.1)

## 2012-06-07 ENCOUNTER — Emergency Department: Payer: Self-pay | Admitting: Emergency Medicine

## 2012-06-07 LAB — COMPREHENSIVE METABOLIC PANEL
Albumin: 3.1 g/dL — ABNORMAL LOW (ref 3.4–5.0)
Alkaline Phosphatase: 131 U/L (ref 50–136)
BUN: 65 mg/dL — ABNORMAL HIGH (ref 7–18)
Bilirubin,Total: 0.2 mg/dL (ref 0.2–1.0)
Calcium, Total: 9 mg/dL (ref 8.5–10.1)
Chloride: 106 mmol/L (ref 98–107)
Creatinine: 2.28 mg/dL — ABNORMAL HIGH (ref 0.60–1.30)
EGFR (African American): 36 — ABNORMAL LOW
EGFR (Non-African Amer.): 31 — ABNORMAL LOW
Glucose: 85 mg/dL (ref 65–99)
Osmolality: 294 (ref 275–301)
Potassium: 4.9 mmol/L (ref 3.5–5.1)
SGOT(AST): 29 U/L (ref 15–37)
Sodium: 138 mmol/L (ref 136–145)
Total Protein: 7.5 g/dL (ref 6.4–8.2)

## 2012-06-07 LAB — CBC
HCT: 33 % — ABNORMAL LOW (ref 40.0–52.0)
MCHC: 32.8 g/dL (ref 32.0–36.0)
Platelet: 264 10*3/uL (ref 150–440)
RDW: 20 % — ABNORMAL HIGH (ref 11.5–14.5)

## 2012-06-07 LAB — URINALYSIS, COMPLETE
Glucose,UR: NEGATIVE mg/dL (ref 0–75)
Ketone: NEGATIVE
Protein: 30

## 2012-06-09 LAB — URINE CULTURE

## 2012-07-02 DIAGNOSIS — N184 Chronic kidney disease, stage 4 (severe): Secondary | ICD-10-CM | POA: Insufficient documentation

## 2012-07-20 ENCOUNTER — Emergency Department: Payer: Self-pay | Admitting: Emergency Medicine

## 2012-07-21 LAB — BASIC METABOLIC PANEL
Anion Gap: 15 (ref 7–16)
Calcium, Total: 9.4 mg/dL (ref 8.5–10.1)
Chloride: 106 mmol/L (ref 98–107)
Creatinine: 2.44 mg/dL — ABNORMAL HIGH (ref 0.60–1.30)
EGFR (African American): 33 — ABNORMAL LOW
EGFR (Non-African Amer.): 28 — ABNORMAL LOW
Glucose: 109 mg/dL — ABNORMAL HIGH (ref 65–99)
Potassium: 4.3 mmol/L (ref 3.5–5.1)

## 2012-07-21 LAB — URINALYSIS, COMPLETE
Glucose,UR: NEGATIVE mg/dL (ref 0–75)
Ketone: NEGATIVE
Nitrite: POSITIVE
Protein: 100
RBC,UR: 81 /HPF (ref 0–5)
Specific Gravity: 1.012 (ref 1.003–1.030)
Squamous Epithelial: NONE SEEN

## 2012-07-22 ENCOUNTER — Ambulatory Visit: Payer: Self-pay | Admitting: Urology

## 2012-07-24 LAB — URINE CULTURE

## 2012-08-19 ENCOUNTER — Ambulatory Visit: Payer: Self-pay | Admitting: Urology

## 2012-09-07 ENCOUNTER — Emergency Department: Payer: Self-pay | Admitting: Emergency Medicine

## 2012-09-07 LAB — URINALYSIS, COMPLETE
Glucose,UR: NEGATIVE mg/dL (ref 0–75)
Ketone: NEGATIVE
Nitrite: POSITIVE
Protein: 30
Specific Gravity: 1.01 (ref 1.003–1.030)
Squamous Epithelial: NONE SEEN
WBC UR: 173 /HPF (ref 0–5)

## 2012-09-17 ENCOUNTER — Ambulatory Visit: Payer: Self-pay | Admitting: Urology

## 2012-09-17 ENCOUNTER — Inpatient Hospital Stay: Payer: Self-pay | Admitting: Internal Medicine

## 2012-09-17 LAB — URINALYSIS, COMPLETE
Bilirubin,UR: NEGATIVE
Glucose,UR: NEGATIVE mg/dL (ref 0–75)
Ketone: NEGATIVE
Nitrite: NEGATIVE
Protein: >=500
Squamous Epithelial: NONE SEEN
WBC UR: 747 /HPF (ref 0–5)

## 2012-09-17 LAB — CBC
HCT: 38.6 % — ABNORMAL LOW (ref 40.0–52.0)
HGB: 12.8 g/dL — ABNORMAL LOW (ref 13.0–18.0)
MCH: 26.9 pg (ref 26.0–34.0)
MCV: 81 fL (ref 80–100)
RBC: 4.76 10*6/uL (ref 4.40–5.90)
RDW: 17.8 % — ABNORMAL HIGH (ref 11.5–14.5)

## 2012-09-17 LAB — COMPREHENSIVE METABOLIC PANEL
Albumin: 3.3 g/dL — ABNORMAL LOW (ref 3.4–5.0)
Alkaline Phosphatase: 105 U/L (ref 50–136)
BUN: 72 mg/dL — ABNORMAL HIGH (ref 7–18)
Chloride: 107 mmol/L (ref 98–107)
Co2: 20 mmol/L — ABNORMAL LOW (ref 21–32)
Creatinine: 3.54 mg/dL — ABNORMAL HIGH (ref 0.60–1.30)
Glucose: 111 mg/dL — ABNORMAL HIGH (ref 65–99)
Osmolality: 294 (ref 275–301)
SGOT(AST): 30 U/L (ref 15–37)

## 2012-09-17 LAB — TROPONIN I: Troponin-I: <0.02 ng/mL

## 2012-09-18 LAB — CBC WITH DIFFERENTIAL/PLATELET
Eosinophil #: 0 10*3/uL (ref 0.0–0.7)
HCT: 33.8 % — ABNORMAL LOW (ref 40.0–52.0)
MCHC: 33 g/dL (ref 32.0–36.0)
MCV: 82 fL (ref 80–100)
Monocyte %: 5.6 %
Neutrophil %: 91.2 %
Platelet: 199 10*3/uL (ref 150–440)
RBC: 4.14 10*6/uL — ABNORMAL LOW (ref 4.40–5.90)
RDW: 17.9 % — ABNORMAL HIGH (ref 11.5–14.5)
WBC: 29.7 10*3/uL — ABNORMAL HIGH (ref 3.8–10.6)

## 2012-09-18 LAB — BASIC METABOLIC PANEL
Chloride: 109 mmol/L — ABNORMAL HIGH (ref 98–107)
Co2: 17 mmol/L — ABNORMAL LOW (ref 21–32)
Creatinine: 4.44 mg/dL — ABNORMAL HIGH (ref 0.60–1.30)
EGFR (African American): 16 — ABNORMAL LOW
EGFR (Non-African Amer.): 14 — ABNORMAL LOW
Potassium: 4.6 mmol/L (ref 3.5–5.1)
Sodium: 138 mmol/L (ref 136–145)

## 2012-09-18 LAB — URINE CULTURE

## 2012-09-19 LAB — CBC WITH DIFFERENTIAL/PLATELET
Basophil #: 0.1 10*3/uL (ref 0.0–0.1)
Eosinophil #: 0.3 10*3/uL (ref 0.0–0.7)
Eosinophil %: 2.1 %
HGB: 10.7 g/dL — ABNORMAL LOW (ref 13.0–18.0)
MCHC: 33.1 g/dL (ref 32.0–36.0)
MCV: 82 fL (ref 80–100)
Monocyte %: 10.8 %
Neutrophil #: 11 10*3/uL — ABNORMAL HIGH (ref 1.4–6.5)
Platelet: 181 10*3/uL (ref 150–440)
RBC: 3.92 10*6/uL — ABNORMAL LOW (ref 4.40–5.90)
WBC: 13.8 10*3/uL — ABNORMAL HIGH (ref 3.8–10.6)

## 2012-09-19 LAB — BASIC METABOLIC PANEL
Anion Gap: 7 (ref 7–16)
BUN: 63 mg/dL — ABNORMAL HIGH (ref 7–18)
Calcium, Total: 8.5 mg/dL (ref 8.5–10.1)
Chloride: 112 mmol/L — ABNORMAL HIGH (ref 98–107)
Co2: 21 mmol/L (ref 21–32)

## 2012-09-20 LAB — BASIC METABOLIC PANEL
Anion Gap: 6 — ABNORMAL LOW (ref 7–16)
BUN: 44 mg/dL — ABNORMAL HIGH (ref 7–18)
Co2: 25 mmol/L (ref 21–32)
EGFR (African American): 26 — ABNORMAL LOW
Glucose: 79 mg/dL (ref 65–99)
Osmolality: 297 (ref 275–301)
Sodium: 144 mmol/L (ref 136–145)

## 2012-09-20 LAB — PROTIME-INR: INR: 1

## 2012-09-21 LAB — BASIC METABOLIC PANEL
Anion Gap: 5 — ABNORMAL LOW (ref 7–16)
Calcium, Total: 8.6 mg/dL (ref 8.5–10.1)
Chloride: 109 mmol/L — ABNORMAL HIGH (ref 98–107)
Creatinine: 2.85 mg/dL — ABNORMAL HIGH (ref 0.60–1.30)
EGFR (African American): 27 — ABNORMAL LOW
EGFR (Non-African Amer.): 23 — ABNORMAL LOW
Osmolality: 285 (ref 275–301)
Sodium: 139 mmol/L (ref 136–145)

## 2012-09-21 LAB — CBC WITH DIFFERENTIAL/PLATELET
Basophil %: 0.8 %
Eosinophil #: 0.4 10*3/uL (ref 0.0–0.7)
Lymphocyte %: 16.2 %
MCHC: 33.8 g/dL (ref 32.0–36.0)
Monocyte #: 1.4 x10 3/mm — ABNORMAL HIGH (ref 0.2–1.0)
Neutrophil #: 5.9 10*3/uL (ref 1.4–6.5)
Neutrophil %: 63.4 %
RDW: 17.6 % — ABNORMAL HIGH (ref 11.5–14.5)
WBC: 9.3 10*3/uL (ref 3.8–10.6)

## 2012-09-21 IMAGING — CR DG CHEST 1V PORT
1 series · 1 of 1 positions shown · non-contrast
Comparison: none

REASON FOR EXAM: cough/fall/rib pain
COMMENTS:

PROCEDURE:     DXR - DXR PORTABLE CHEST SINGLE VIEW  - February 27, 2010 [DATE]
RESULT:     No acute cardiopulmonary disease is identified.

[view not recorded]
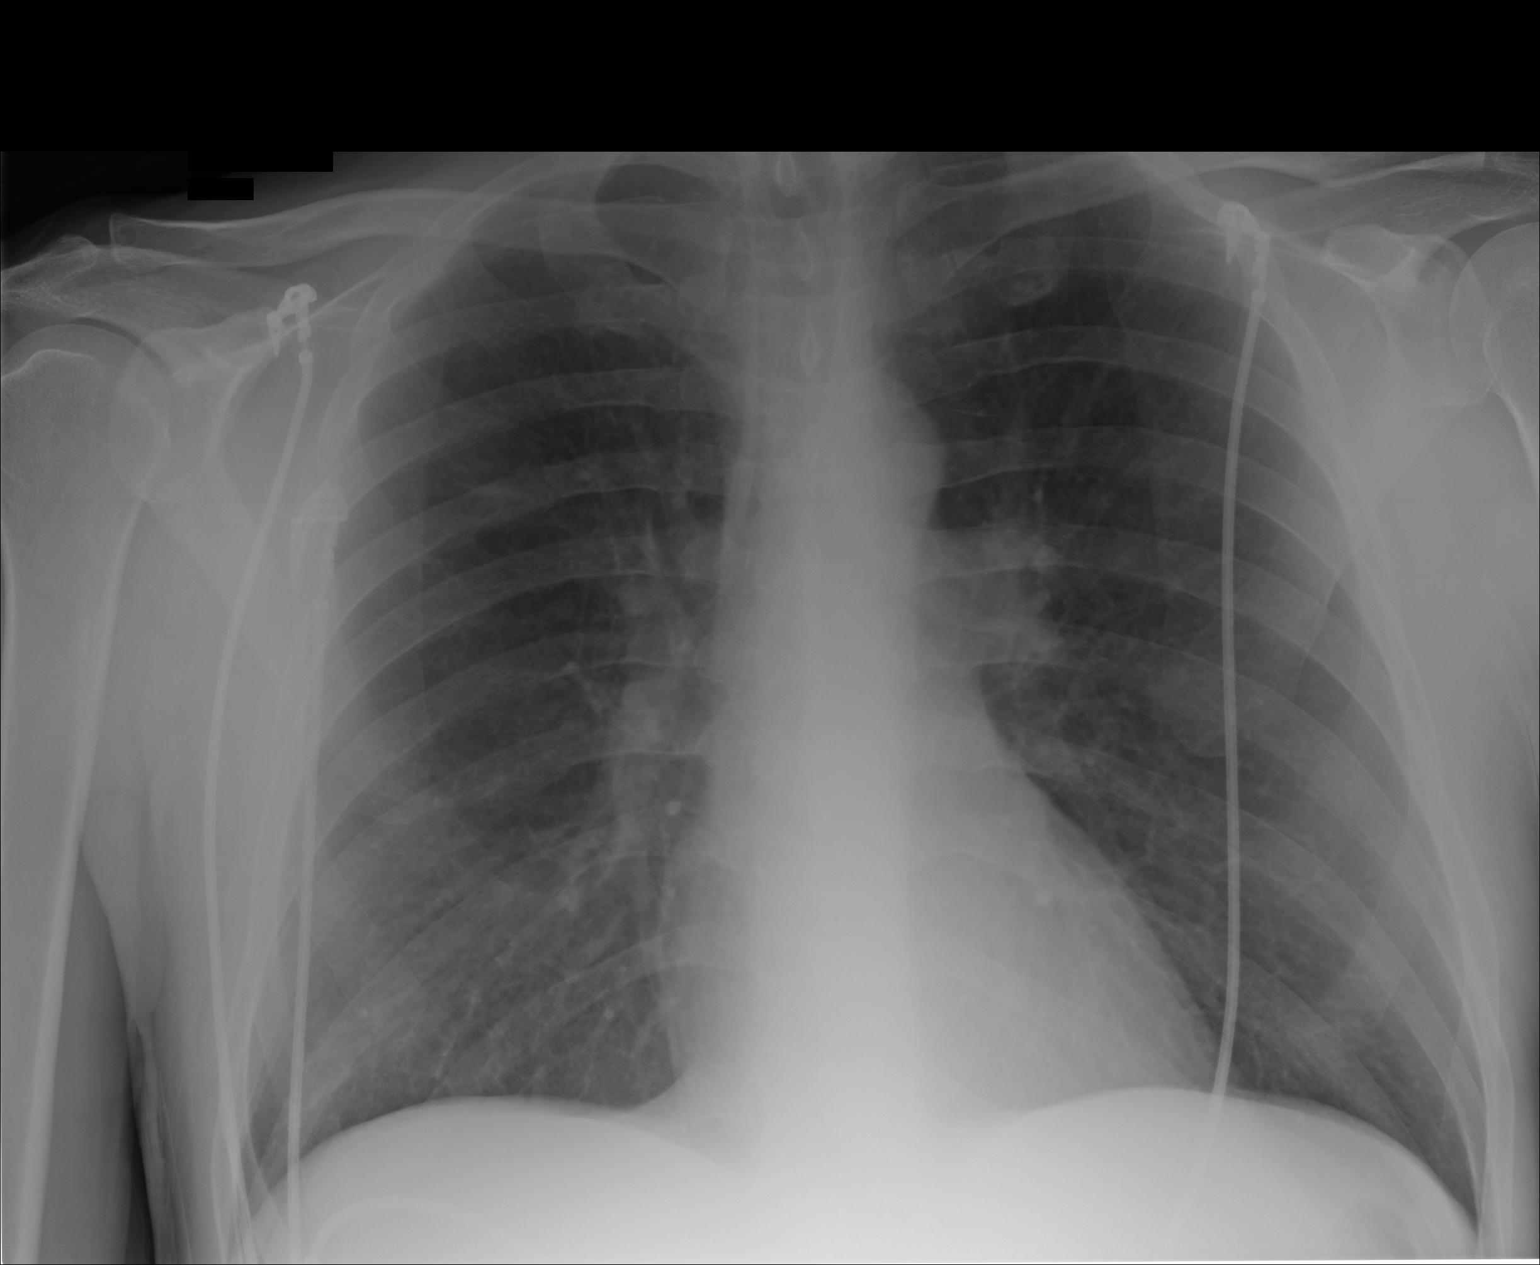

[1 of 1 positions shown; findings below may reference images not displayed]

IMPRESSION: No acute abnormality.

## 2012-09-22 LAB — BASIC METABOLIC PANEL
Anion Gap: 5 — ABNORMAL LOW (ref 7–16)
Chloride: 112 mmol/L — ABNORMAL HIGH (ref 98–107)
Co2: 24 mmol/L (ref 21–32)
Creatinine: 2.35 mg/dL — ABNORMAL HIGH (ref 0.60–1.30)
Potassium: 4.2 mmol/L (ref 3.5–5.1)
Sodium: 141 mmol/L (ref 136–145)

## 2012-09-22 LAB — CULTURE, BLOOD (SINGLE)

## 2012-09-25 IMAGING — CT CT ABD-PELV W/ CM
1 of 3 series · 14 of 32 positions shown, 19 images · non-contrast
Comparison: none

REASON FOR EXAM: (1) pain; (2) pain
COMMENTS:

[Series 2: 3mm soft tissue · axial · 0.77mm/px · z∈[-1005,-591]mm · 14 of 158 slices shown, 19 images]
[im 10/158  soft-tissue]
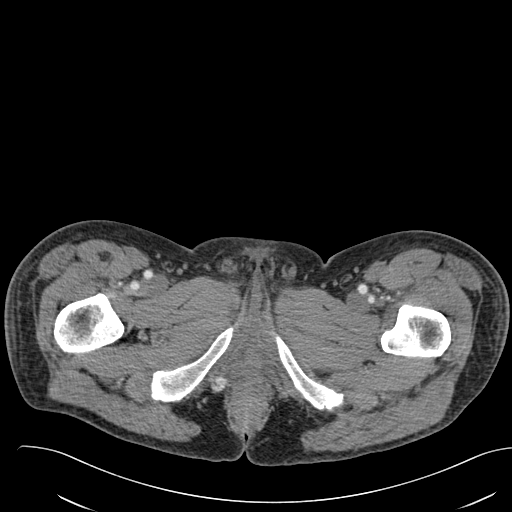
[im 10/158  bone]
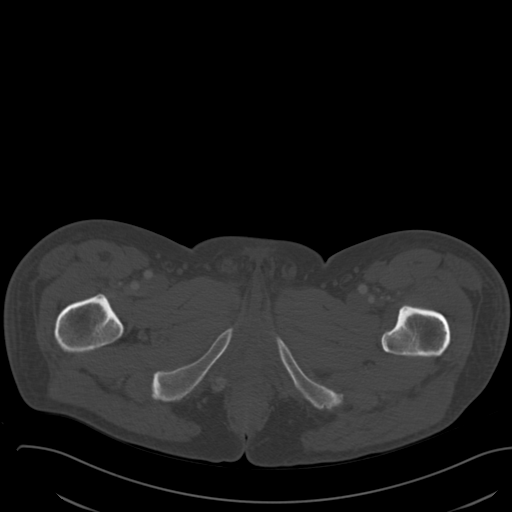
[im 19/158  soft-tissue]
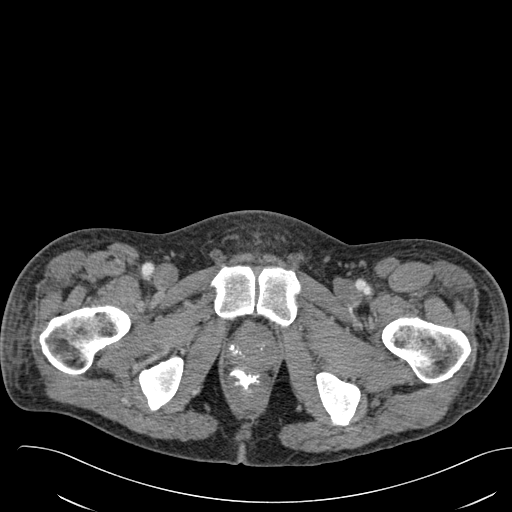
[im 37/158  soft-tissue]
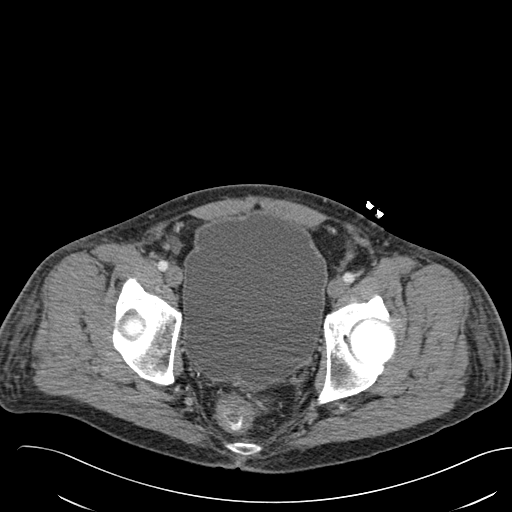
[im 47/158  soft-tissue]
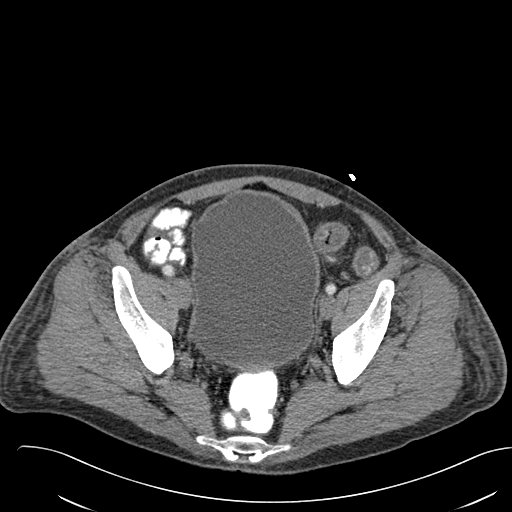
[im 56/158  soft-tissue]
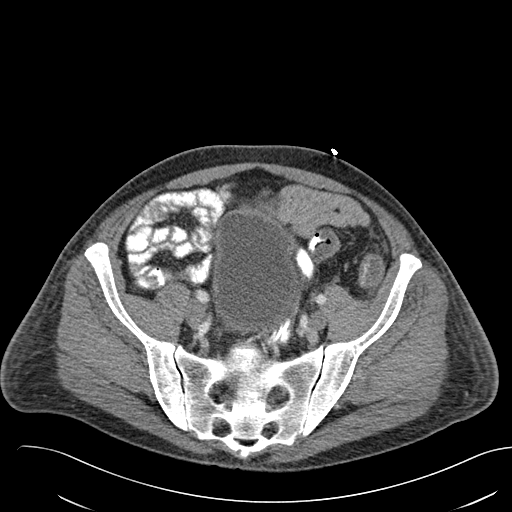
[im 65/158  soft-tissue]
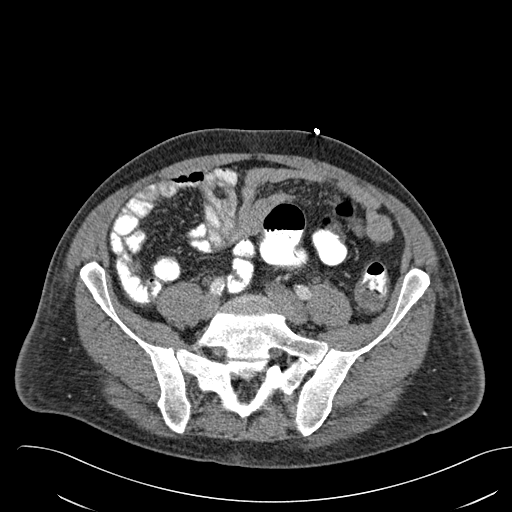
[im 84/158  soft-tissue]
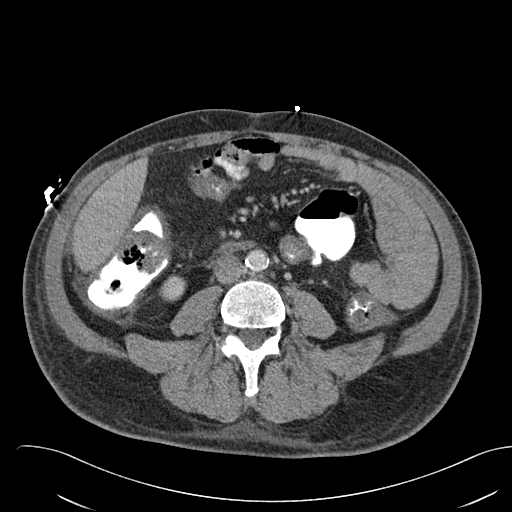
[im 93/158  soft-tissue]
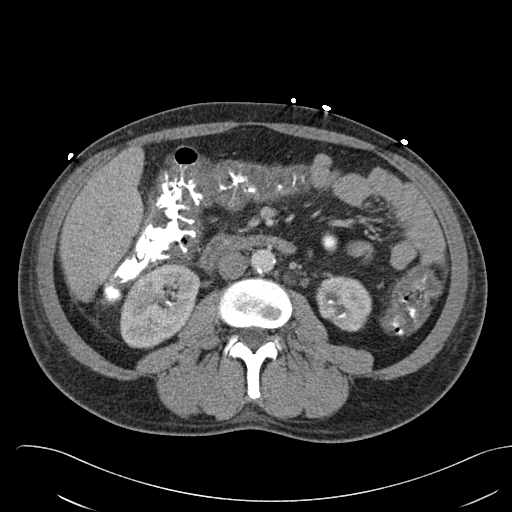
[im 102/158  soft-tissue]
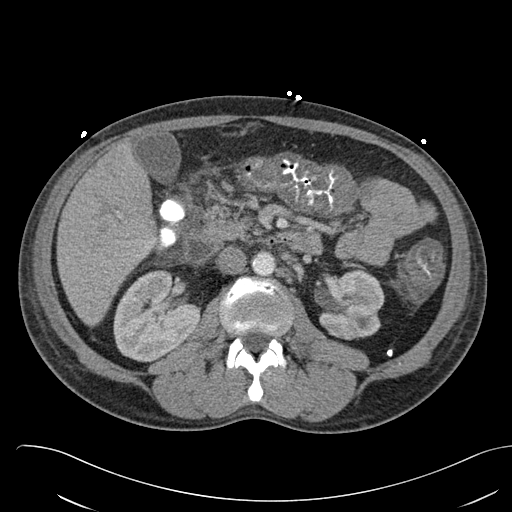
[im 102/158  bone]
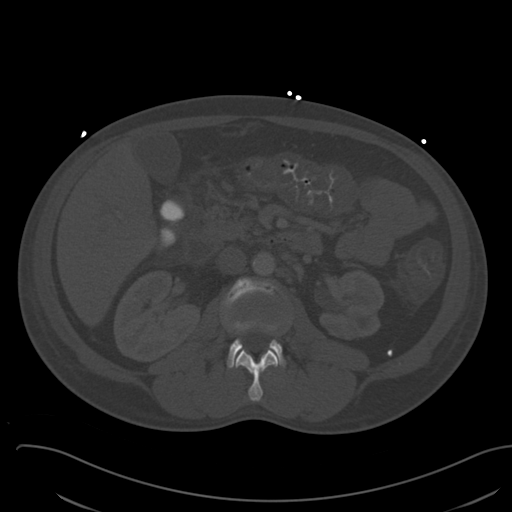
[im 111/158  soft-tissue]
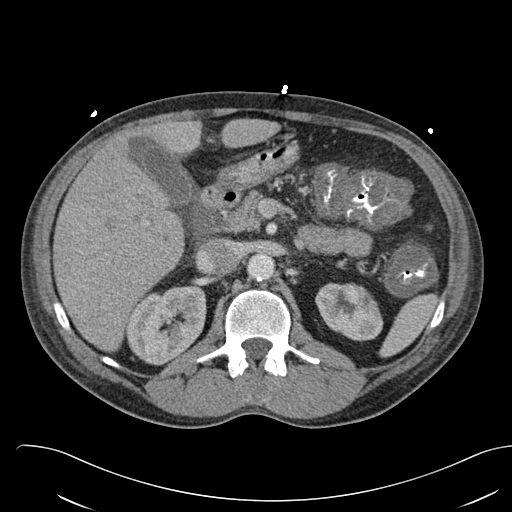
[im 121/158  soft-tissue]
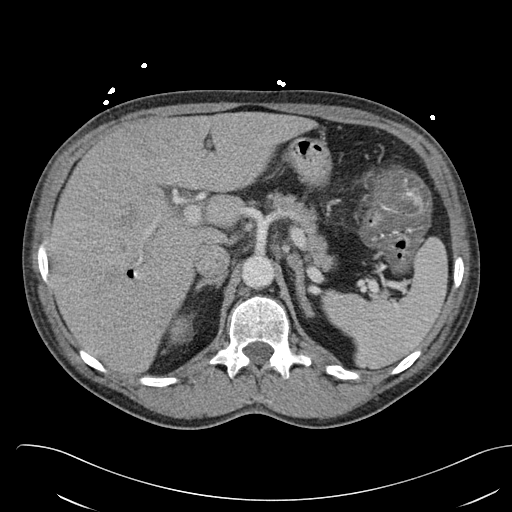
[im 121/158  lung]
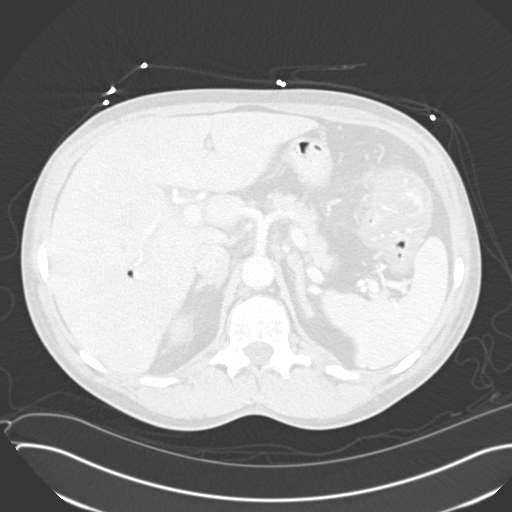
[im 130/158  lung]
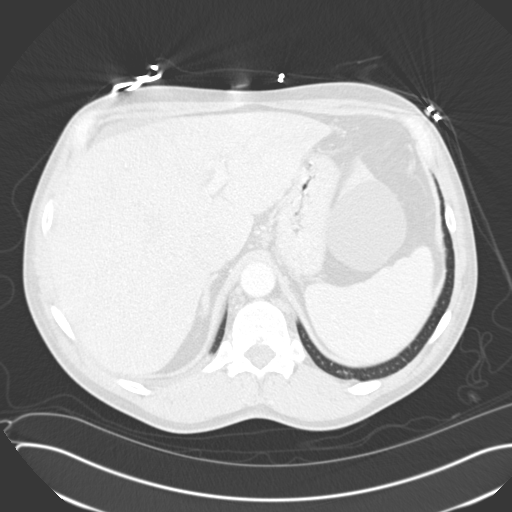
[im 139/158  soft-tissue]
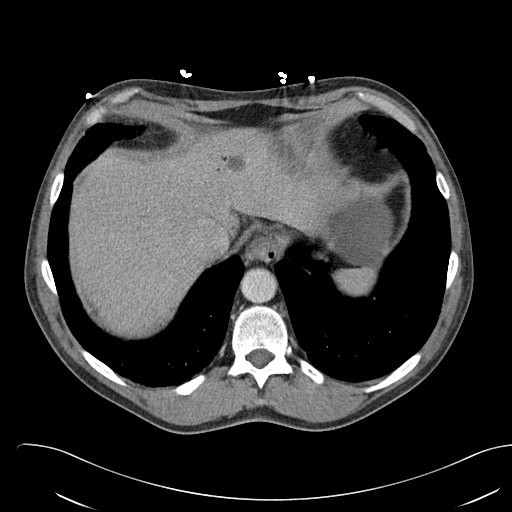
[im 139/158  lung]
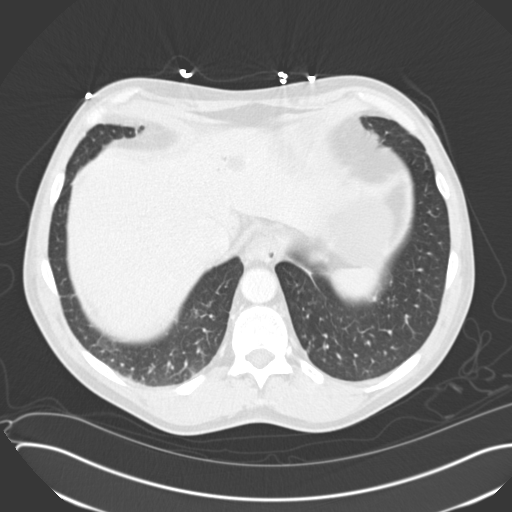
[im 148/158  soft-tissue]
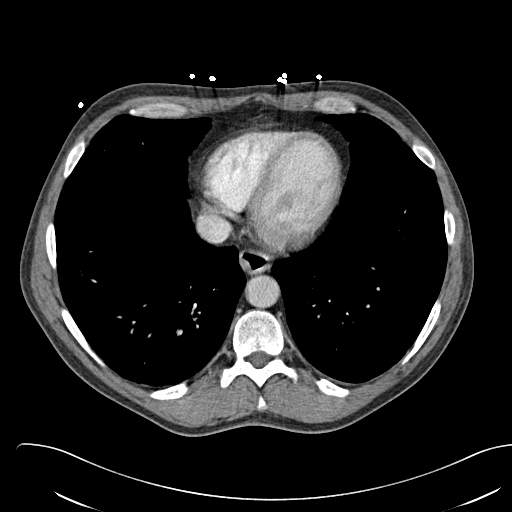
[im 148/158  lung]
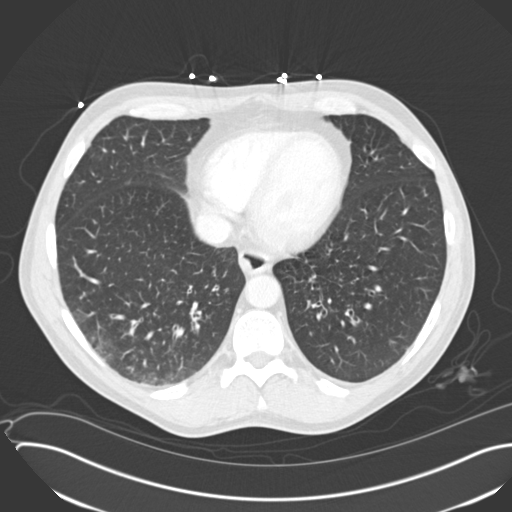

[14 of 32 positions shown; findings below may reference images not displayed]

PROCEDURE:     CT  - CT ABDOMEN / PELVIS  W  - March 03, 2010  [DATE]

RESULT:     History: Pain.

Procedure and Findings:Standard CT obtained. Comparison made to prior study
of 07/31/2008. 100 cc of [id number] used for the procedure. Tiny hepatic cysts
are again noted. Air is noted in the liver. This could be within the portal
venous system or biliary system. Diffuse colonic thickening is noted.
Colonic wall thickening is severe. Subtle pneumatosis coli cannot be
entirely excluded. Portal vein and mesenteric vessels are intact. Small
amount of free pelvic fluid noted. This most likely account for the mild
amount of fluid about the gallbladder. At atrophy left kidney noted. Benign
calcification is noted. Simple cysts present a right kidney. Aorta
unremarkable. Bladder nondistended. Appendix not well visualized.
IMPRESSION: Diffuse severe thickening of the colonic wall. Subtle
pneumatosis coli cannot be entirely excluded. Air is also noted within the
liver possibly the portal vein. Possibility of ischemic colitis as well as
inflammatory or infectious colitis should be considered in this patient.
Small amount of free fluid noted. No free air. Mild atelectasis. This report
was phoned to the patient's physician at time of study.

## 2012-09-26 IMAGING — US ABDOMEN ULTRASOUND
1 series · 13 of 25 positions shown · non-contrast
Comparison: none

REASON FOR EXAM: cholecystitis, ? air in biliary tree or portal vein.
COMMENTS:

[Series 1: abdomen ultrasound · 0.31mm/px · 13 of 91 slices shown]
[im 1/91]
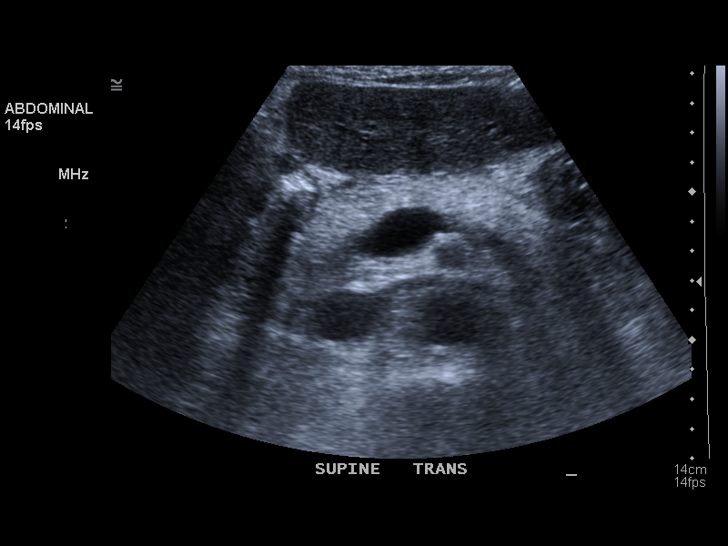
[im 8/91]
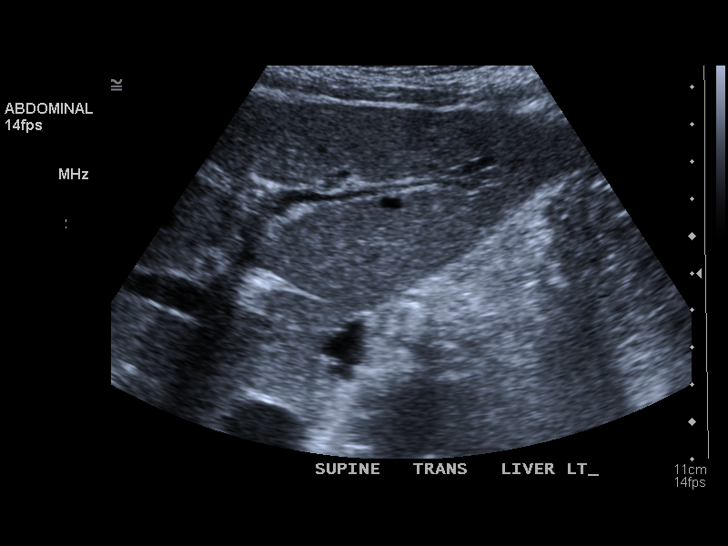
[im 16/91]
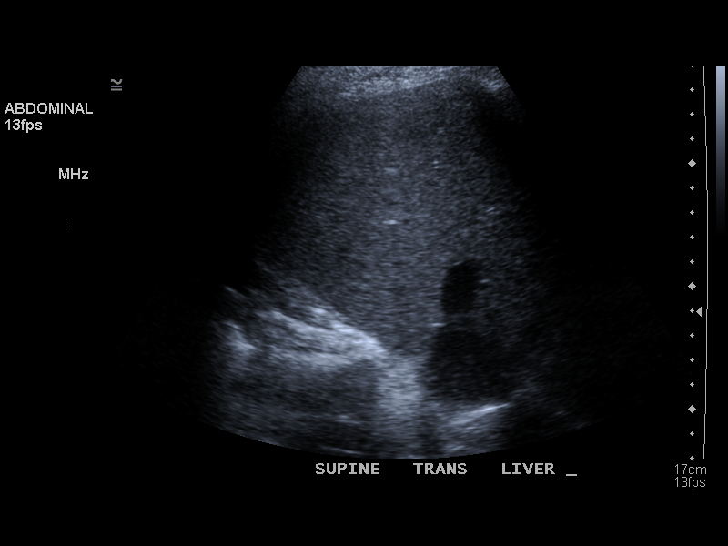
[im 23/91]
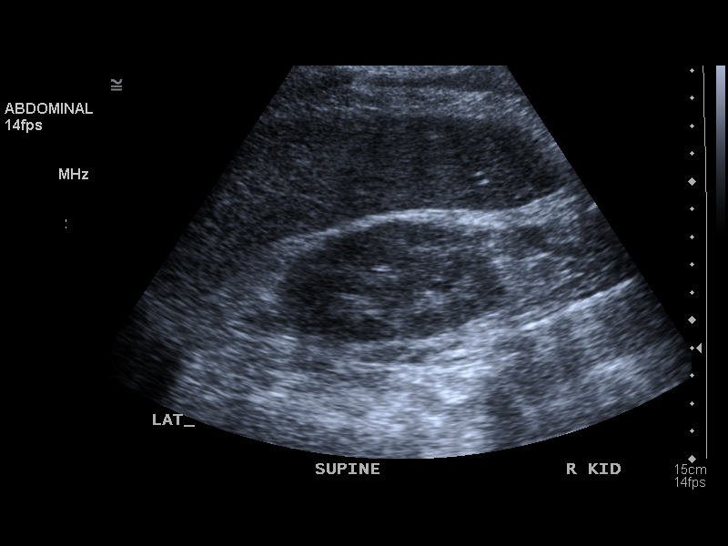
[im 31/91]
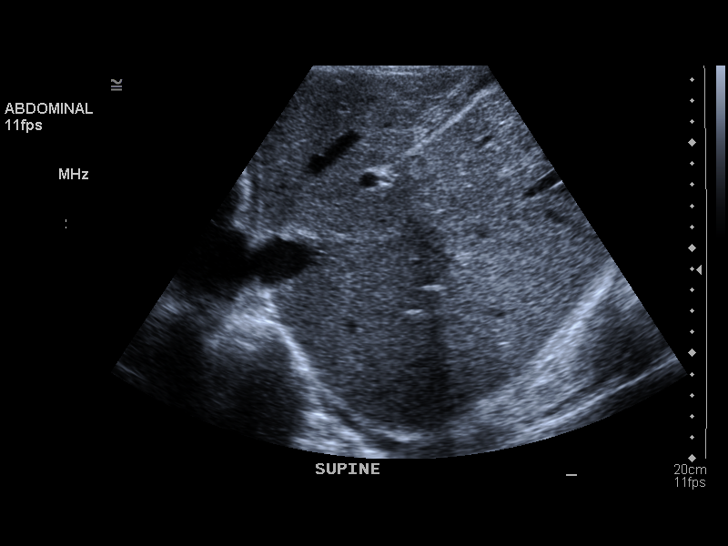
[im 38/91]
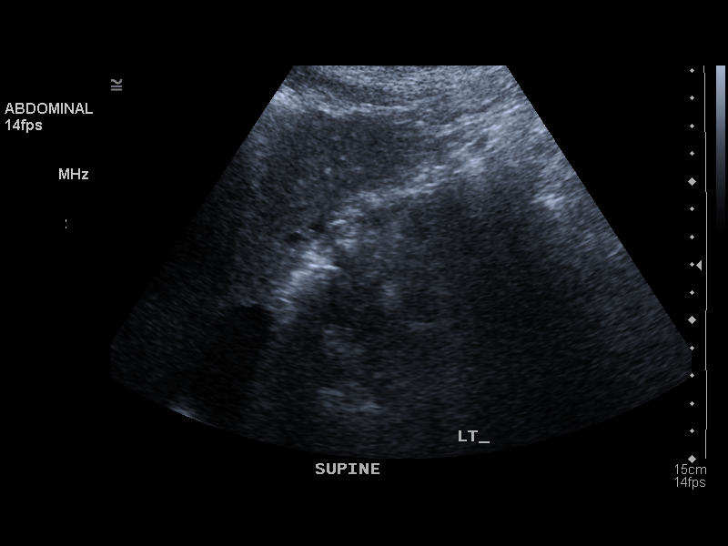
[im 46/91]
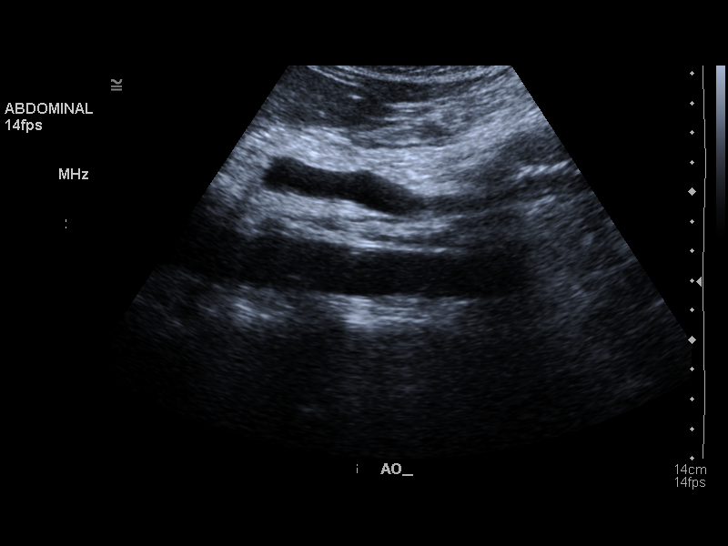
[im 53/91]
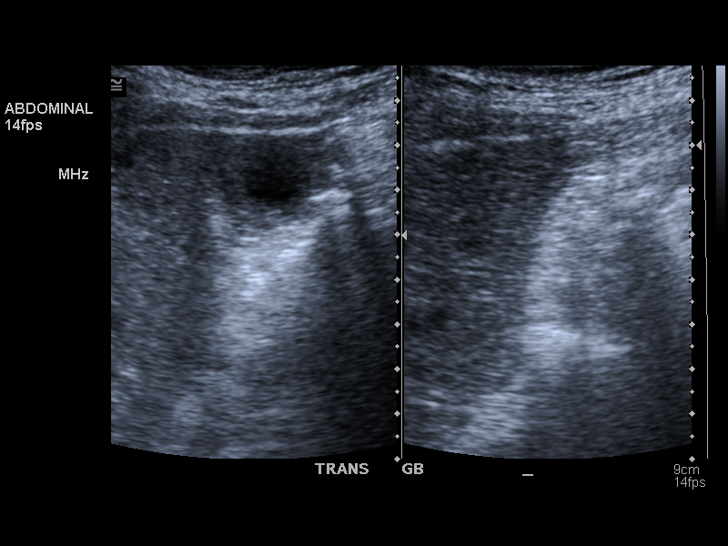
[im 61/91]
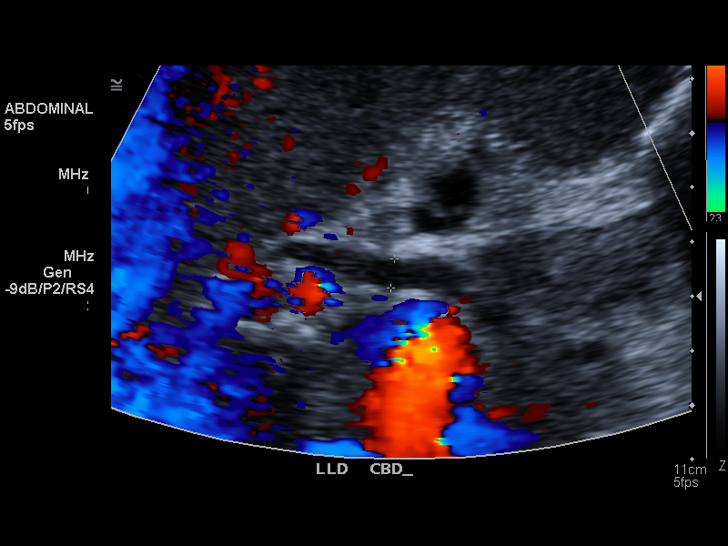
[im 68/91]
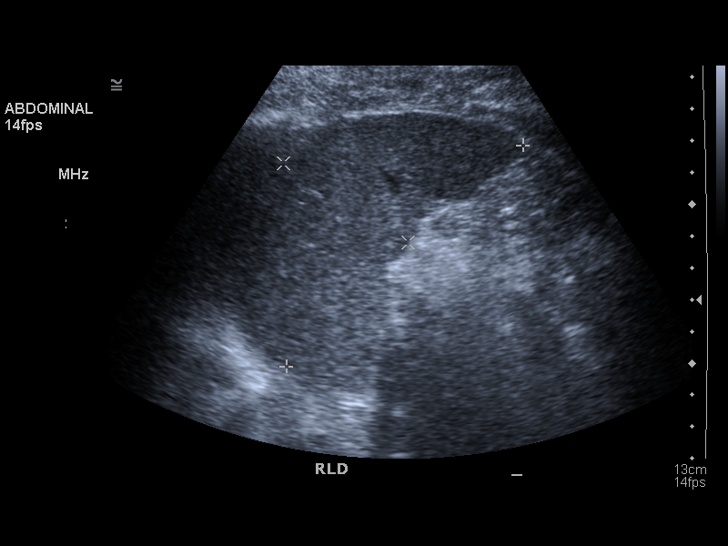
[im 76/91]
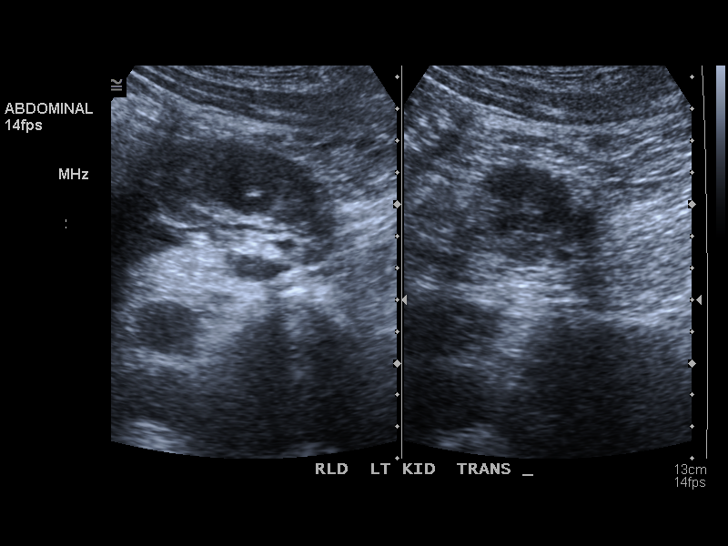
[im 83/91]
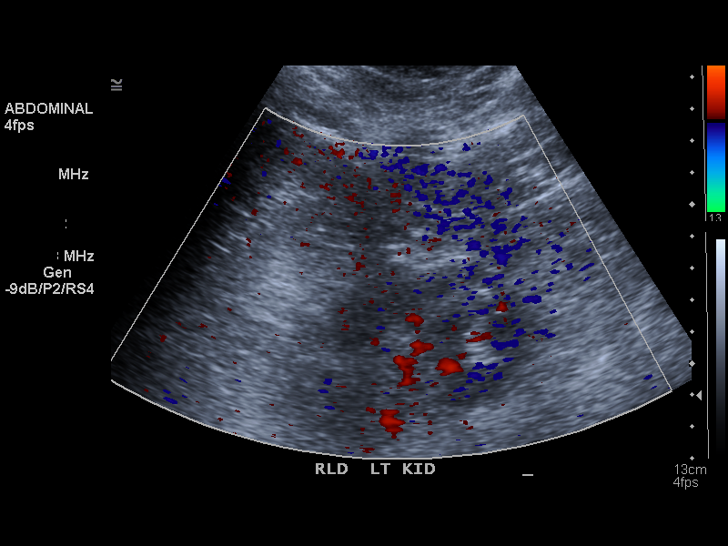
[im 91/91]
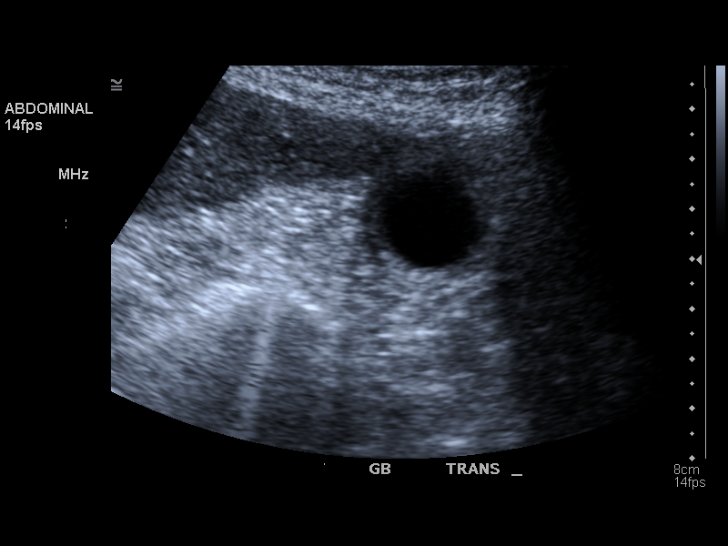

[13 of 25 positions shown; findings below may reference images not displayed]

PROCEDURE:     US  - US ABDOMEN GENERAL SURVEY  - March 04, 2010 [DATE]

RESULT:     A few small hepatic cysts are observed. Additionally, there is a
large cyst of the left lobe of the liver which measures approximately
cm at maximum diameter. Portions of the head and tail of the pancreas are
obscured but the pancreas is for the most part visualized and is normal in
appearance. The spleen is normal in size. The abdominal aorta and inferior
vena cava show no acute changes. No gallstones are seen. There is no
thickening of the gallbladder wall. The common bile duct measures 6.1 mm in
diameter which is within the limits of normal. The kidneys show no
hydronephrosis. The left kidney is smaller than the right and has an
irregular cortical margin. The right kidney measures 11.27 cm sagittally and
the left kidney measures 8.97 cm sagittally.
IMPRESSION: 1.  Multiple hepatic cysts are noted. The largest extends off the left lobe
into the left abdomen and measures 6.56 cm at maximum diameter.
2.  Portal vein flow is hepatopetal.
3.  No gallstones are seen.
4.  The left kidney is small and has an irregular cortical margin.
5.  No hydronephrosis is seen on either side.
6.  Although not mentioned above, there is observed a small, right pleural
effusion.
7.  No ascites is seen.

## 2012-10-04 IMAGING — CR DG CHEST 2V
1 series · 2 of 2 positions shown · non-contrast
Comparison: none

REASON FOR EXAM: weak
COMMENTS:

[Series 1: view not recorded · 0.17mm/px · 2 of 2 slices shown]
[im 1/2]
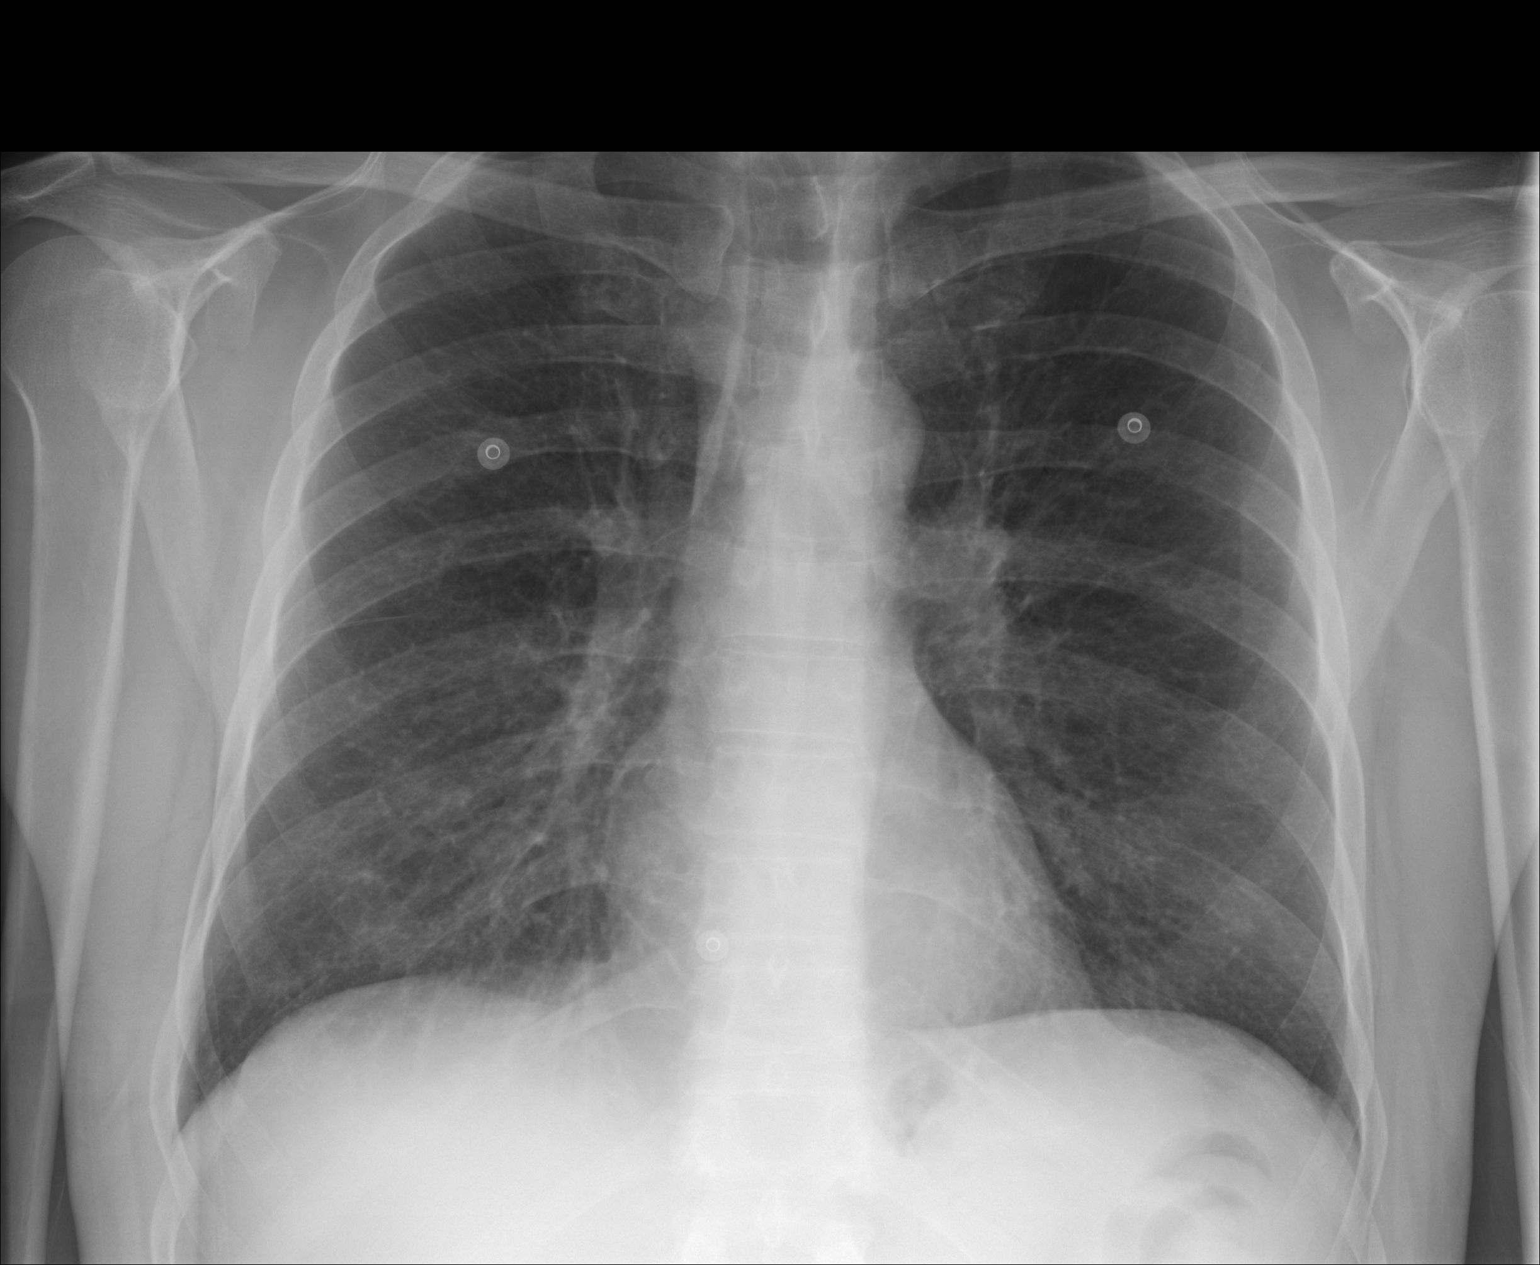
[im 2/2]
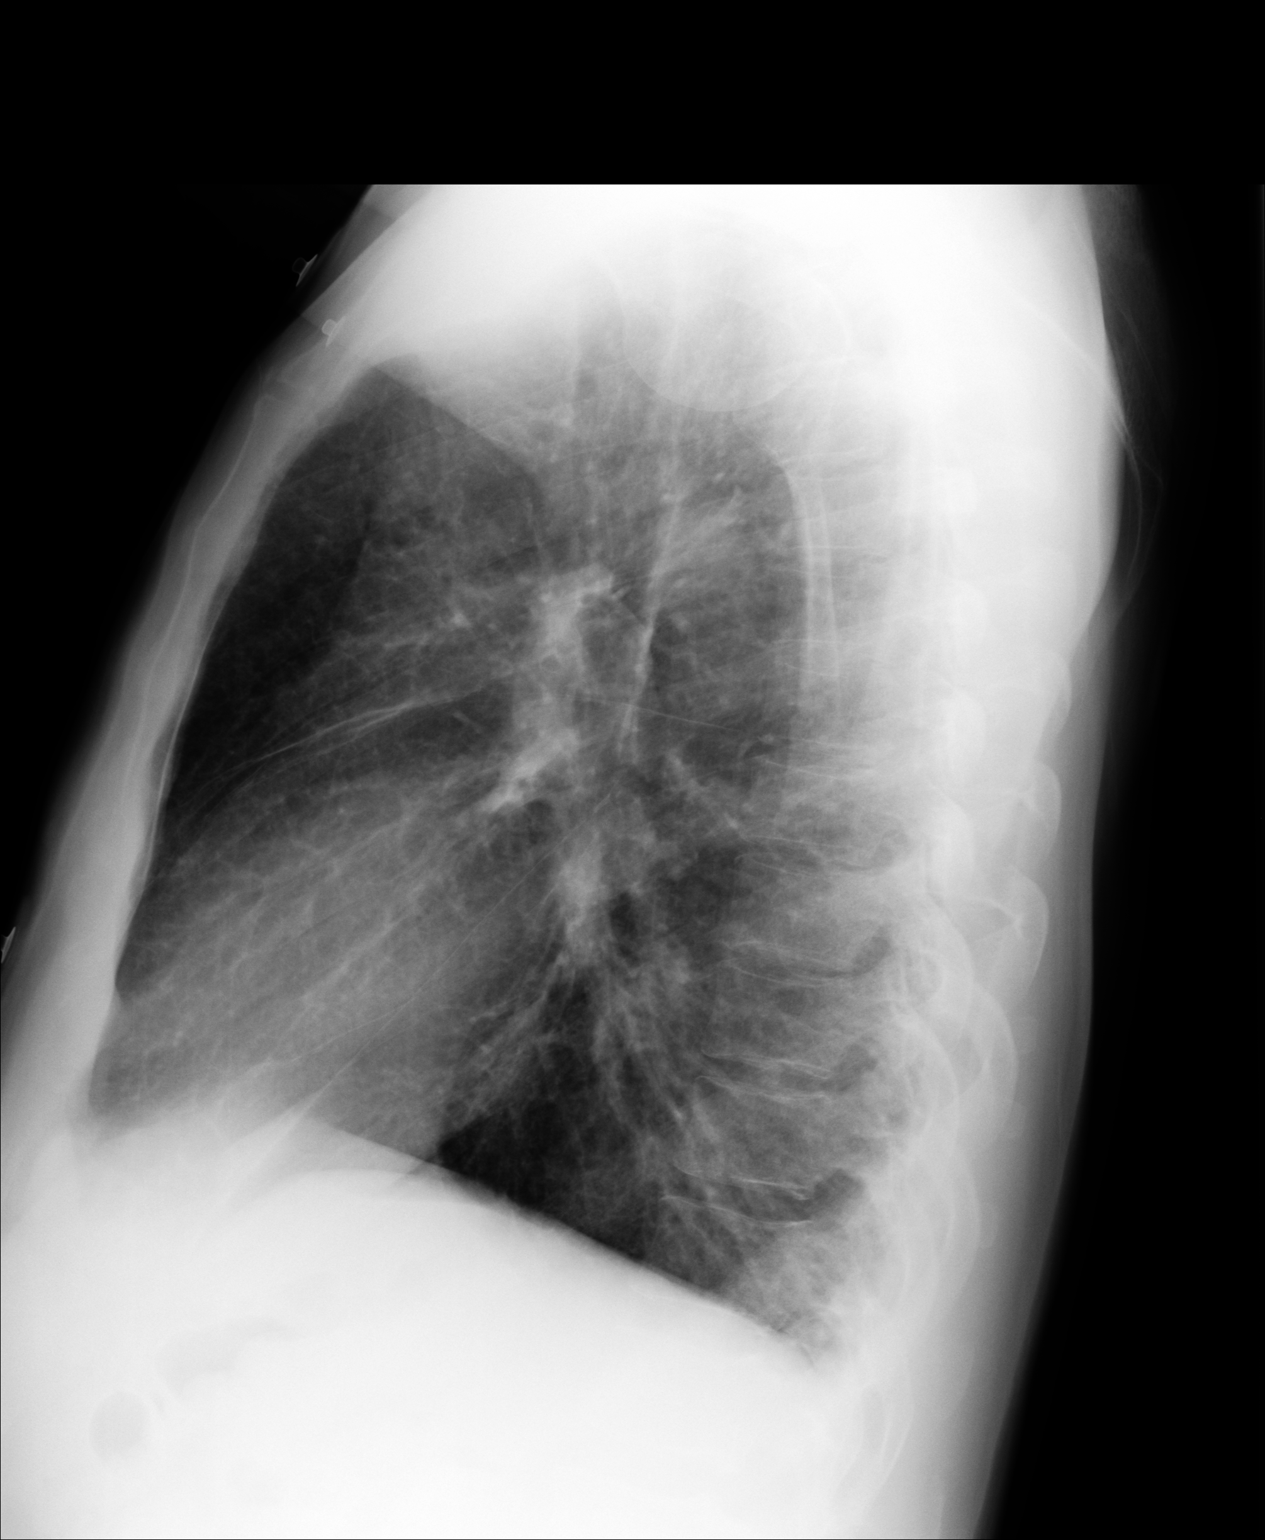

[2 of 2 positions shown; findings below may reference images not displayed]

PROCEDURE:     DXR - DXR CHEST PA (OR AP) AND LATERAL  - March 12, 2010  [DATE]

RESULT:     Comparison is made to the study 27 February, 2010.

The lungs are mildly hyperinflated. The interstitial markings are mildly
increased. I see no pleural effusion. The cardiac silhouette is normal in
size. The central pulmonary vascularity is prominent but stable. I see no
acute bony abnormality.
IMPRESSION: There is hyperinflation consistent with COPD. There may be
underlying pulmonary fibrosis versus mild interstitial edema. There is no
alveolar pneumonia nor evidence of pulmonary vascular congestion.

## 2012-10-05 ENCOUNTER — Ambulatory Visit: Payer: Self-pay | Admitting: Urology

## 2012-10-06 LAB — CBC WITH DIFFERENTIAL/PLATELET
Basophil #: 0.1 x10 3/mm 3
Basophil %: 0.3 %
Eosinophil #: 0.1 x10 3/mm 3
Eosinophil %: 0.5 %
HCT: 32.1 % — ABNORMAL LOW
HGB: 10.7 g/dL — ABNORMAL LOW
Lymphocyte %: 5.6 %
Lymphs Abs: 1.2 x10 3/mm 3
MCH: 27.1 pg
MCHC: 33.2 g/dL
MCV: 82 fL
Monocyte #: 1.4 "x10 3/mm " — ABNORMAL HIGH
Monocyte %: 6.6 %
Neutrophil #: 18.7 x10 3/mm 3 — ABNORMAL HIGH
Neutrophil %: 87 %
Platelet: 225 x10 3/mm 3
RBC: 3.93 x10 6/mm 3 — ABNORMAL LOW
RDW: 18.3 % — ABNORMAL HIGH
WBC: 21.5 x10 3/mm 3 — ABNORMAL HIGH

## 2012-10-06 LAB — BASIC METABOLIC PANEL WITH GFR
Anion Gap: 6 — ABNORMAL LOW
BUN: 44 mg/dL — ABNORMAL HIGH
Calcium, Total: 8.9 mg/dL
Chloride: 111 mmol/L — ABNORMAL HIGH
Co2: 21 mmol/L
Creatinine: 2.67 mg/dL — ABNORMAL HIGH
EGFR (African American): 29 — ABNORMAL LOW
EGFR (Non-African Amer.): 25 — ABNORMAL LOW
Glucose: 88 mg/dL
Osmolality: 286
Potassium: 4.4 mmol/L
Sodium: 138 mmol/L

## 2012-10-06 LAB — MAGNESIUM: Magnesium: 1.6 mg/dL — ABNORMAL LOW

## 2012-11-02 ENCOUNTER — Emergency Department: Payer: Self-pay | Admitting: Emergency Medicine

## 2012-11-02 LAB — URINALYSIS, COMPLETE
Bacteria: NONE SEEN
Bilirubin,UR: NEGATIVE
Glucose,UR: NEGATIVE mg/dL (ref 0–75)
Ketone: NEGATIVE
Ph: 7 (ref 4.5–8.0)
Protein: 100
WBC UR: 5 /HPF (ref 0–5)

## 2012-11-02 LAB — CBC WITH DIFFERENTIAL/PLATELET
Basophil #: 0 10*3/uL (ref 0.0–0.1)
Eosinophil #: 0 10*3/uL (ref 0.0–0.7)
Eosinophil %: 0 %
HCT: 31.9 % — ABNORMAL LOW (ref 40.0–52.0)
HGB: 10.6 g/dL — ABNORMAL LOW (ref 13.0–18.0)
Lymphocyte %: 3.3 %
MCH: 27.6 pg (ref 26.0–34.0)
MCHC: 33.1 g/dL (ref 32.0–36.0)
MCV: 83 fL (ref 80–100)
Monocyte #: 1.2 x10 3/mm — ABNORMAL HIGH (ref 0.2–1.0)
Neutrophil #: 24.1 10*3/uL — ABNORMAL HIGH (ref 1.4–6.5)
Neutrophil %: 92.1 %
Platelet: 173 10*3/uL (ref 150–440)
RBC: 3.83 10*6/uL — ABNORMAL LOW (ref 4.40–5.90)
RDW: 18.2 % — ABNORMAL HIGH (ref 11.5–14.5)
WBC: 26.1 10*3/uL — ABNORMAL HIGH (ref 3.8–10.6)

## 2012-11-02 LAB — COMPREHENSIVE METABOLIC PANEL
Albumin: 2.8 g/dL — ABNORMAL LOW (ref 3.4–5.0)
Alkaline Phosphatase: 101 U/L (ref 50–136)
Anion Gap: 13 (ref 7–16)
BUN: 136 mg/dL — ABNORMAL HIGH (ref 7–18)
Calcium, Total: 9.5 mg/dL (ref 8.5–10.1)
Chloride: 112 mmol/L — ABNORMAL HIGH (ref 98–107)
EGFR (African American): 5 — ABNORMAL LOW
EGFR (Non-African Amer.): 5 — ABNORMAL LOW
Potassium: 6.2 mmol/L — ABNORMAL HIGH (ref 3.5–5.1)
SGPT (ALT): 26 U/L (ref 12–78)
Total Protein: 7.4 g/dL (ref 6.4–8.2)

## 2012-11-03 ENCOUNTER — Encounter (HOSPITAL_COMMUNITY): Payer: Self-pay | Admitting: Internal Medicine

## 2012-11-03 ENCOUNTER — Inpatient Hospital Stay (HOSPITAL_COMMUNITY): Payer: No Typology Code available for payment source

## 2012-11-03 ENCOUNTER — Inpatient Hospital Stay (HOSPITAL_COMMUNITY)
Admission: AD | Admit: 2012-11-03 | Discharge: 2012-11-11 | DRG: 853 | Disposition: A | Discharge status: Skilled Nursing Facility | Payer: No Typology Code available for payment source | Source: Other Acute Inpatient Hospital | Attending: Family Medicine | Admitting: Family Medicine

## 2012-11-03 DIAGNOSIS — D72829 Elevated white blood cell count, unspecified: Secondary | ICD-10-CM | POA: Diagnosis present

## 2012-11-03 DIAGNOSIS — T8389XA Other specified complication of genitourinary prosthetic devices, implants and grafts, initial encounter: Secondary | ICD-10-CM | POA: Diagnosis present

## 2012-11-03 DIAGNOSIS — A419 Sepsis, unspecified organism: Principal | ICD-10-CM

## 2012-11-03 DIAGNOSIS — Y849 Medical procedure, unspecified as the cause of abnormal reaction of the patient, or of later complication, without mention of misadventure at the time of the procedure: Secondary | ICD-10-CM | POA: Diagnosis present

## 2012-11-03 DIAGNOSIS — F209 Schizophrenia, unspecified: Secondary | ICD-10-CM

## 2012-11-03 DIAGNOSIS — G9340 Encephalopathy, unspecified: Secondary | ICD-10-CM

## 2012-11-03 DIAGNOSIS — E870 Hyperosmolality and hypernatremia: Secondary | ICD-10-CM | POA: Diagnosis not present

## 2012-11-03 DIAGNOSIS — F17200 Nicotine dependence, unspecified, uncomplicated: Secondary | ICD-10-CM | POA: Diagnosis present

## 2012-11-03 DIAGNOSIS — E872 Acidosis, unspecified: Secondary | ICD-10-CM

## 2012-11-03 DIAGNOSIS — B952 Enterococcus as the cause of diseases classified elsewhere: Secondary | ICD-10-CM | POA: Diagnosis present

## 2012-11-03 DIAGNOSIS — N179 Acute kidney failure, unspecified: Secondary | ICD-10-CM

## 2012-11-03 DIAGNOSIS — N12 Tubulo-interstitial nephritis, not specified as acute or chronic: Secondary | ICD-10-CM | POA: Diagnosis present

## 2012-11-03 DIAGNOSIS — E039 Hypothyroidism, unspecified: Secondary | ICD-10-CM

## 2012-11-03 DIAGNOSIS — B957 Other staphylococcus as the cause of diseases classified elsewhere: Secondary | ICD-10-CM | POA: Diagnosis present

## 2012-11-03 DIAGNOSIS — N139 Obstructive and reflux uropathy, unspecified: Secondary | ICD-10-CM | POA: Diagnosis present

## 2012-11-03 DIAGNOSIS — E875 Hyperkalemia: Secondary | ICD-10-CM

## 2012-11-03 DIAGNOSIS — R6520 Severe sepsis without septic shock: Secondary | ICD-10-CM

## 2012-11-03 DIAGNOSIS — IMO0002 Reserved for concepts with insufficient information to code with codable children: Secondary | ICD-10-CM

## 2012-11-03 DIAGNOSIS — E43 Unspecified severe protein-calorie malnutrition: Secondary | ICD-10-CM

## 2012-11-03 DIAGNOSIS — N1330 Unspecified hydronephrosis: Secondary | ICD-10-CM | POA: Diagnosis present

## 2012-11-03 HISTORY — DX: Mental disorder, not otherwise specified: F99

## 2012-11-03 HISTORY — DX: Hypothyroidism, unspecified: E03.9

## 2012-11-03 LAB — BLOOD GAS, ARTERIAL
Acid-base deficit: 9.3 mmol/L — ABNORMAL HIGH (ref 0.0–2.0)
Drawn by: 34764
O2 Saturation: 80.8 %
Patient temperature: 98.4
pO2, Arterial: 48.6 mmHg — ABNORMAL LOW (ref 80.0–100.0)

## 2012-11-03 LAB — CBC WITH DIFFERENTIAL/PLATELET
Basophils Absolute: 0 10*3/uL (ref 0.0–0.1)
Eosinophils Absolute: 0 10*3/uL (ref 0.0–0.7)
Eosinophils Relative: 0 % (ref 0–5)
HCT: 28.8 % — ABNORMAL LOW (ref 39.0–52.0)
Lymphocytes Relative: 4 % — ABNORMAL LOW (ref 12–46)
MCH: 28 pg (ref 26.0–34.0)
MCHC: 33.7 g/dL (ref 30.0–36.0)
MCV: 83.2 fL (ref 78.0–100.0)
Monocytes Absolute: 1.4 10*3/uL — ABNORMAL HIGH (ref 0.1–1.0)
Platelets: 178 10*3/uL (ref 150–400)
RDW: 17.6 % — ABNORMAL HIGH (ref 11.5–15.5)

## 2012-11-03 LAB — COMPREHENSIVE METABOLIC PANEL
ALT: 25 U/L (ref 0–53)
AST: 24 U/L (ref 0–37)
Albumin: 2.7 g/dL — ABNORMAL LOW (ref 3.5–5.2)
Alkaline Phosphatase: 95 U/L (ref 39–117)
BUN: 151 mg/dL — ABNORMAL HIGH (ref 6–23)
Chloride: 110 mEq/L (ref 96–112)
Potassium: 6.6 mEq/L (ref 3.5–5.1)
Sodium: 144 mEq/L (ref 135–145)
Total Bilirubin: 0.2 mg/dL — ABNORMAL LOW (ref 0.3–1.2)
Total Protein: 6.9 g/dL (ref 6.0–8.3)

## 2012-11-03 LAB — PROTIME-INR: INR: 1.38 (ref 0.00–1.49)

## 2012-11-03 LAB — CBC
HCT: 30.8 % — ABNORMAL LOW (ref 39.0–52.0)
MCH: 27.8 pg (ref 26.0–34.0)
MCV: 82.4 fL (ref 78.0–100.0)
RBC: 3.74 MIL/uL — ABNORMAL LOW (ref 4.22–5.81)
WBC: 20.1 10*3/uL — ABNORMAL HIGH (ref 4.0–10.5)

## 2012-11-03 LAB — BASIC METABOLIC PANEL
CO2: 13 mEq/L — ABNORMAL LOW (ref 19–32)
Chloride: 111 mEq/L (ref 96–112)
Glucose, Bld: 97 mg/dL (ref 70–99)
Sodium: 144 mEq/L (ref 135–145)

## 2012-11-03 LAB — URINALYSIS, ROUTINE W REFLEX MICROSCOPIC
Ketones, ur: NEGATIVE mg/dL
Nitrite: POSITIVE — AB
Protein, ur: 100 mg/dL — AB
Urobilinogen, UA: 0.2 mg/dL (ref 0.0–1.0)
pH: 7 (ref 5.0–8.0)

## 2012-11-03 LAB — URINE MICROSCOPIC-ADD ON

## 2012-11-03 LAB — GLUCOSE, CAPILLARY

## 2012-11-03 MED ORDER — ACETAMINOPHEN 650 MG RE SUPP: 650.0000 mg | Freq: Four times a day (QID) | RECTAL | Status: DC | PRN

## 2012-11-03 MED ORDER — SODIUM POLYSTYRENE SULFONATE 15 GM/60ML PO SUSP
60.0000 g | Freq: Once | ORAL | Status: AC
  Administered 2012-11-03: 60 g via RECTAL
  Filled 2012-11-03: qty 240

## 2012-11-03 MED ORDER — FENTANYL CITRATE 0.05 MG/ML IJ SOLN
INTRAMUSCULAR | Status: AC | PRN
  Administered 2012-11-03 (×2): 50 ug via INTRAVENOUS

## 2012-11-03 MED ORDER — DEXTROSE 50 % IV SOLN
1.0000 | Freq: Once | INTRAVENOUS | Status: AC
  Administered 2012-11-03: 50 mL via INTRAVENOUS
  Filled 2012-11-03: qty 50

## 2012-11-03 MED ORDER — ONDANSETRON HCL 4 MG PO TABS: 4.0000 mg | ORAL_TABLET | Freq: Four times a day (QID) | ORAL | Status: DC | PRN

## 2012-11-03 MED ORDER — SODIUM CHLORIDE 0.9 % IV SOLN: INTRAVENOUS | Status: DC

## 2012-11-03 MED ORDER — INSULIN ASPART 100 UNIT/ML ~~LOC~~ SOLN
10.0000 [IU] | Freq: Once | SUBCUTANEOUS | Status: AC
  Administered 2012-11-03: 10 [IU] via INTRAVENOUS

## 2012-11-03 MED ORDER — ACETAMINOPHEN 325 MG PO TABS
650.0000 mg | ORAL_TABLET | Freq: Four times a day (QID) | ORAL | Status: DC | PRN
  Administered 2012-11-05 – 2012-11-09 (×6): 650 mg via ORAL
  Filled 2012-11-03 (×6): qty 2

## 2012-11-03 MED ORDER — SODIUM CHLORIDE 0.9 % IV SOLN
INTRAVENOUS | Status: DC
  Administered 2012-11-03: 03:00:00 via INTRAVENOUS

## 2012-11-03 MED ORDER — IOHEXOL 300 MG/ML  SOLN
50.0000 mL | Freq: Once | INTRAMUSCULAR | Status: AC | PRN
  Administered 2012-11-03: 15 mL via INTRAVENOUS

## 2012-11-03 MED ORDER — SODIUM CHLORIDE 0.9 % IV SOLN
1.0000 g | Freq: Once | INTRAVENOUS | Status: AC
  Administered 2012-11-03: 1 g via INTRAVENOUS
  Filled 2012-11-03: qty 10

## 2012-11-03 MED ORDER — PIPERACILLIN-TAZOBACTAM IN DEX 2-0.25 GM/50ML IV SOLN
2.2500 g | Freq: Once | INTRAVENOUS | Status: AC
  Administered 2012-11-03: 2.25 g via INTRAVENOUS
  Filled 2012-11-03: qty 50

## 2012-11-03 MED ORDER — ALBUTEROL SULFATE (5 MG/ML) 0.5% IN NEBU
INHALATION_SOLUTION | RESPIRATORY_TRACT | Status: AC
  Filled 2012-11-03: qty 2

## 2012-11-03 MED ORDER — ONDANSETRON HCL 4 MG/2ML IJ SOLN: 4.0000 mg | Freq: Four times a day (QID) | INTRAMUSCULAR | Status: DC | PRN

## 2012-11-03 MED ORDER — STERILE WATER FOR INJECTION IV SOLN
INTRAVENOUS | Status: DC
  Administered 2012-11-03: 16:00:00 via INTRAVENOUS
  Filled 2012-11-03 (×3): qty 850

## 2012-11-03 MED ORDER — SODIUM POLYSTYRENE SULFONATE 15 GM/60ML PO SUSP
30.0000 g | Freq: Once | ORAL | Status: AC
  Administered 2012-11-03: 30 g via RECTAL
  Filled 2012-11-03: qty 120

## 2012-11-03 MED ORDER — SODIUM CHLORIDE 0.9 % IJ SOLN
3.0000 mL | Freq: Two times a day (BID) | INTRAMUSCULAR | Status: DC
  Administered 2012-11-03: 3 mL via INTRAVENOUS

## 2012-11-03 MED ORDER — PIPERACILLIN-TAZOBACTAM IN DEX 2-0.25 GM/50ML IV SOLN
2.2500 g | Freq: Three times a day (TID) | INTRAVENOUS | Status: DC
  Administered 2012-11-03 – 2012-11-09 (×18): 2.25 g via INTRAVENOUS
  Filled 2012-11-03 (×21): qty 50

## 2012-11-03 MED ORDER — MIDAZOLAM HCL 2 MG/2ML IJ SOLN
INTRAMUSCULAR | Status: AC | PRN
  Administered 2012-11-03 (×3): 1 mg via INTRAVENOUS

## 2012-11-03 MED ORDER — ALBUTEROL (5 MG/ML) CONTINUOUS INHALATION SOLN
10.0000 mg/h | INHALATION_SOLUTION | RESPIRATORY_TRACT | Status: DC
  Administered 2012-11-03: 10 mg/h via RESPIRATORY_TRACT
  Filled 2012-11-03: qty 20

## 2012-11-03 NOTE — Progress Notes (Signed)
Unable to complete admission history due to pt's current confusion. Will pass along to day RN to further evaluate.

## 2012-11-03 NOTE — H&P (Signed)
Alan Small is an 57 y.o. male.   Chief Complaint: pt was taken to [facility] from living facility yesterday with increasing confusion. CT scan reveals B hydronephrosis and labs reveal high BUN/CR: 148/11.4 today Previous stents apparently placed at [place] - ??date?? Unable to retrieve due to calcification- transferred to [facility] after discussion with Dr Gaynelle Arabian Now scheduled for B Percutaneous nephrostomy tube placment HPI: schizophrenia; hypothyroidism; ARF- worsening renal fxns  Past Medical History  Diagnosis Date  . Mental disorder   . Hypothyroidism     Past Surgical History  Procedure Laterality Date  . Appendectomy      Family History  Problem Relation Age of Onset  . Stroke Father    Social History:  reports that he has been smoking.  He does not have any smokeless tobacco history on file. He reports that he does not drink alcohol or use illicit drugs.  Allergies:  Allergies  Allergen Reactions  . Mellaril (Thioridazine) Other (See Comments)    Unknown   . Morphine And Related Itching  . Navane (Thiothixene) Other (See Comments)    GI Distress  . Thorazine (Chlorpromazine) Other (See Comments)    Unknown    No prescriptions prior to admission    Results for orders placed during the hospital encounter of 11/03/12 (from the past 48 hour(s))  COMPREHENSIVE METABOLIC PANEL     Status: Abnormal   Collection Time    11/03/12  1:50 AM      Result Value Range   Sodium 144  135 - 145 mEq/L   Potassium 6.6 (*) 3.5 - 5.1 mEq/L   Comment: NO VISIBLE HEMOLYSIS     CRITICAL RESULT CALLED TO, READ BACK BY AND VERIFIED WITH:     YORK M,RN 11/03/12 0230 WAYK   Chloride 110  96 - 112 mEq/L   CO2 14 (*) 19 - 32 mEq/L   Glucose, Bld 117 (*) 70 - 99 mg/dL   BUN 151 (*) 6 - 23 mg/dL   Creatinine, Ser 10.76 (*) 0.50 - 1.35 mg/dL   Calcium 9.6  8.4 - 10.5 mg/dL   Total Protein 6.9  6.0 - 8.3 g/dL   Albumin 2.7 (*) 3.5 - 5.2 g/dL   AST 24  0 - 37 U/L   ALT 25  0 - 53 U/L   Alkaline Phosphatase 95  39 - 117 U/L   Total Bilirubin 0.2 (*) 0.3 - 1.2 mg/dL   GFR calc non Af Amer 5 (*) >90 mL/min   GFR calc Af Amer 5 (*) >90 mL/min   Comment:            The eGFR has been calculated     using the CKD EPI equation.     This calculation has not been     validated in all clinical     situations.     eGFR's persistently     <90 mL/min signify     possible Chronic Kidney Disease.  CBC WITH DIFFERENTIAL     Status: Abnormal   Collection Time    11/03/12  1:50 AM      Result Value Range   WBC 22.4 (*) 4.0 - 10.5 K/uL   RBC 3.46 (*) 4.22 - 5.81 MIL/uL   Hemoglobin 9.7 (*) 13.0 - 17.0 g/dL   HCT 28.8 (*) 39.0 - 52.0 %   MCV 83.2  78.0 - 100.0 fL   MCH 28.0  26.0 - 34.0 pg   MCHC 33.7  30.0 -  36.0 g/dL   RDW 17.6 (*) 11.5 - 15.5 %   Platelets 178  150 - 400 K/uL   Neutrophils Relative % 90 (*) 43 - 77 %   Neutro Abs 20.2 (*) 1.7 - 7.7 K/uL   Lymphocytes Relative 4 (*) 12 - 46 %   Lymphs Abs 0.8  0.7 - 4.0 K/uL   Monocytes Relative 6  3 - 12 %   Monocytes Absolute 1.4 (*) 0.1 - 1.0 K/uL   Eosinophils Relative 0  0 - 5 %   Eosinophils Absolute 0.0  0.0 - 0.7 K/uL   Basophils Relative 0  0 - 1 %   Basophils Absolute 0.0  0.0 - 0.1 K/uL  BLOOD GAS, ARTERIAL     Status: Abnormal   Collection Time    11/03/12  1:55 AM      Result Value Range   Delivery systems ROOM AIR     pH, Arterial 7.354  7.350 - 7.450   pCO2 arterial 27.9 (*) 35.0 - 45.0 mmHg   pO2, Arterial 48.6 (*) 80.0 - 100.0 mmHg   Bicarbonate 15.2 (*) 20.0 - 24.0 mEq/L   TCO2 16.0  0 - 100 mmol/L   Acid-base deficit 9.3 (*) 0.0 - 2.0 mmol/L   O2 Saturation 80.8     Patient temperature 98.4     Collection site RIGHT RADIAL     Drawn by [phone]     Sample type ARTERIAL     Allens test (pass/fail) PASS  PASS  GLUCOSE, CAPILLARY     Status: Abnormal   Collection Time    11/03/12  2:00 AM      Result Value Range   Glucose-Capillary 109 (*) 70 - 99 mg/dL   Comment 1 Notify RN      Comment 2 Documented in Chart    MRSA PCR SCREENING     Status: None   Collection Time    11/03/12  2:29 AM      Result Value Range   MRSA by PCR NEGATIVE  NEGATIVE   Comment:            The GeneXpert MRSA Assay (FDA     approved for NASAL specimens     only), is one component of a     comprehensive MRSA colonization     surveillance program. It is not     intended to diagnose MRSA     infection nor to guide or     monitor treatment for     MRSA infections.  GLUCOSE, CAPILLARY     Status: Abnormal   Collection Time    11/03/12  3:52 AM      Result Value Range   Glucose-Capillary 167 (*) 70 - 99 mg/dL   Comment 1 Notify RN     Comment 2 Documented in Chart    PROTIME-INR     Status: Abnormal   Collection Time    11/03/12  6:05 AM      Result Value Range   Prothrombin Time 16.6 (*) 11.6 - 15.2 seconds   INR 1.38  0.00 - 99991111  BASIC METABOLIC PANEL     Status: Abnormal   Collection Time    11/03/12  6:05 AM      Result Value Range   Sodium 144  135 - 145 mEq/L   Potassium 6.0 (*) 3.5 - 5.1 mEq/L   Chloride 111  96 - 112 mEq/L   CO2 13 (*) 19 - 32 mEq/L  Glucose, Bld 97  70 - 99 mg/dL   BUN 148 (*) 6 - 23 mg/dL   Creatinine, Ser 11.39 (*) 0.50 - 1.35 mg/dL   Calcium 9.6  8.4 - 10.5 mg/dL   GFR calc non Af Amer 4 (*) >90 mL/min   GFR calc Af Amer 5 (*) >90 mL/min   Comment:            The eGFR has been calculated     using the CKD EPI equation.     This calculation has not been     validated in all clinical     situations.     eGFR's persistently     <90 mL/min signify     possible Chronic Kidney Disease.  CBC     Status: Abnormal   Collection Time    11/03/12  6:05 AM      Result Value Range   WBC 20.1 (*) 4.0 - 10.5 K/uL   RBC 3.74 (*) 4.22 - 5.81 MIL/uL   Hemoglobin 10.4 (*) 13.0 - 17.0 g/dL   HCT 30.8 (*) 39.0 - 52.0 %   MCV 82.4  78.0 - 100.0 fL   MCH 27.8  26.0 - 34.0 pg   MCHC 33.8  30.0 - 36.0 g/dL   RDW 17.7 (*) 11.5 - 15.5 %   Platelets 184   150 - 400 K/uL   Dg Chest Port 1 View  11/03/2012   *RADIOLOGY REPORT*  Clinical Data: Follow up infiltrates  PORTABLE CHEST - 1 VIEW  Comparison: None  Findings: The heart size appears normal.  No pleural effusion identified.  No airspace consolidation.  Mild to moderate pulmonary vascular congestion is noted bilaterally.  The visualized bony structures appear intact.  IMPRESSION:  1.  Pulmonary vascular congestion.   Original Report Authenticated By: Kerby Moors, M.D.    Review of Systems  Constitutional: Positive for weight loss. Negative for fever.  Respiratory: Negative for shortness of breath.   Cardiovascular: Negative for chest pain.  Gastrointestinal: Negative for nausea and vomiting.  Genitourinary:       Little UOP  Neurological: Positive for weakness.  Psychiatric/Behavioral:       Schizophrenia    Blood pressure 150/95, pulse 100, temperature 97.7 F (36.5 C), temperature source Axillary, resp. rate 23, height 6\' 1"  (1.854 m), weight 145 lb 11.6 oz (66.1 kg), SpO2 100.00%. Physical Exam  Constitutional:  Thin; frail  Cardiovascular: Normal rate, regular rhythm and normal heart sounds.   No murmur heard. Respiratory: Effort normal and breath sounds normal.  Musculoskeletal: Normal range of motion.  Neurological:  Schizophrenia; AMS  Psychiatric:  Consented sister over phone     Assessment/Plan AMS ARF; worsening renal fxn B hydro Scheduled for B PCNs pts sister aware of procedure benefits and risks and agreeable to proceed Consented over phone- in chart  [place] A 11/03/2012, 9:29 AM

## 2012-11-03 NOTE — Progress Notes (Signed)
UR Completed Burna Atlas Graves-Bigelow, RN,BSN [phone]  

## 2012-11-03 NOTE — Care Management Note (Unsigned)
    Page 1 of 1   11/03/2012     4:27:40 PM   CARE MANAGEMENT NOTE 11/03/2012  Patient:  Alan Small, Alan Small   Account Number:  [id number]  Date Initiated:  11/03/2012  Documentation initiated by:  GRAVES-BIGELOW,Bentlie Catanzaro  Subjective/Objective Assessment:   Pt admitted with Increasing confusion.   K >6 on admit. S/p  B Percutaneous nephrostomy tube placment. Plan for SNF at d/c.     Action/Plan:   CSW is working with pt and family for disposition needs. Cm will continue to monitor.   Anticipated DC Date:  11/06/2012   Anticipated DC Plan:  [place]  CM consult      Choice offered to / List presented to:             Status of service:  In process, will continue to follow Medicare Important Message given?   (If response is "NO", the following Medicare IM given date fields will be blank) Date Medicare IM given:   Date Additional Medicare IM given:    Discharge Disposition:    Per UR Regulation:  Reviewed for med. necessity/level of care/duration of stay  If discussed at [place] of Stay Meetings, dates discussed:    Comments:

## 2012-11-03 NOTE — Progress Notes (Signed)
Clinical Social Work Department BRIEF PSYCHOSOCIAL ASSESSMENT 11/03/2012  Patient:  Alan Small, Alan Small     Account Number:  [id number]     Admit date:  11/03/2012  Clinical Social Worker:  Valda Lamb  Date/Time:  11/03/2012 01:34 PM  Referred by:  RN  Date Referred:  11/03/2012 Referred for  SNF Placement   Other Referral:   Interview type:  Family Other interview type:   CSW completed assessment with pt sister(s) Marlowe Kays [phone] and Dossie Der [phone]. CSW also completed the assessment with the supervisor at [facility], Carmon Sails [phone]    PSYCHOSOCIAL DATA Living Status:  ALONE Admitted from facility:   Level of care:  Independent Living Primary support name:  Marlowe Kays [id number] Primary support relationship to patient:  SIBLING Degree of support available:   Pt sisters actively involved in pt care.    CURRENT CONCERNS Current Concerns  Post-Acute Placement   Other Concerns:    SOCIAL WORK ASSESSMENT / PLAN CSW informed that pt was admitted from an [facility] through [facility]. Pt currently is not alert or oriented to participate in assessment with CSW.    CSW contacted pt program supervisor, Carmon Sails [phone] in order to gather information pertaining to pt living conditions and amount of support pt has through the program. Theophilus Kinds informed CSW that though pt receives services through [facility], he lives in independent housing. Theophilus Kinds expressed much concern in pt's inability to care for himelf as he has been found ill several times in his apartment. Per Theophilus Kinds she and family have discussed concerns with pt in the past however he has declined a higher level of care. CSW also informed that pt does not have a guaradian however a DSS social worker has been involved in the past Rory Percy [phone]). CSW also informed that pt also see's a psychiatrist (Dr. Celestia Khat) to manage his paranoid schizophrenia and  where he receives a prolexin injection 1x month. CSW informed pt supervisor that CSW would contact pt family to discuss discharge plans for pt as it does not appear that pt will be able to return to his independent living situation at discharge.    CSW met with pt sisters in pt room to discuss possible SNF placement. Pt sisters were very appreciative of visit and informed CSW that pt has lived by himself and agree that pt is unable to care for himself at home. CSW explored pt sisters feelings regarding SNF placement. Pt sisters stated they would be agreeable to SNF placement in [facility] where family will be able to visit often. CSW provided sisters with information pertaining to applying for Medicaid as pt will most likely need LT placement. Pt sister agreeable to begin Medicaid process.    CSW to complete FL2 and pasarr.   Assessment/plan status:  Psychosocial Support/Ongoing Assessment of Needs Other assessment/ plan:   Information/referral to community resources:   CSW provided pt sisters with a SNF list and with information on applying for Medicaid.    CSW has left a message for pt DSS worker Rory Percy.    PATIENT'S/FAMILY'S RESPONSE TO PLAN OF CARE: Pt laying in bed unable to participate in assessment. Assessment completed with pt sisters who are next of kin. Pt sisters agreeable to SNF placement.       Hunt Oris, MSW, [place]

## 2012-11-03 NOTE — Progress Notes (Addendum)
Clinical Social Work Department CLINICAL SOCIAL WORK PLACEMENT NOTE 11/03/2012  Patient:  Alan Small, Alan Small  Account Number:  [id number] Admit date:  11/03/2012  Clinical Social Worker:  Hunt Oris, Latanya Presser  Date/time:  11/03/2012 03:51 PM  Clinical Social Work is seeking post-discharge placement for this patient at the following level of care:   SKILLED NURSING   (*CSW will update this form in Epic as items are completed)   11/03/2012  Patient/family provided with [place] Department of Clinical Social Work's list of facilities offering this level of care within the geographic area requested by the patient (or if unable, by the patient's family).  11/03/2012  Patient/family informed of their freedom to choose among providers that offer the needed level of care, that participate in Medicare, Medicaid or managed care program needed by the patient, have an available bed and are willing to accept the patient.  11/03/2012  Patient/family informed of [facility]' ownership interest in [facility], as well as of the fact that they are under no obligation to receive care at this facility.  PASARR submitted to EDS on 11/03/2012 PASARR number received from EDS on 11/05/12  FL2 transmitted to all facilities in geographic area requested by pt/family on  11/03/2012 FL2 transmitted to all facilities within larger geographic area on   Patient informed that his/her managed care company has contracts with or will negotiate with  certain facilities, including the following:     Patient/family informed of bed offers received:  11/05/12 Patient chooses bed at [facility] Physician recommends and patient chooses bed at    Patient to be transferred to [facility] on 11/12/12   Patient to be transferred to facility by ambulance  The following physician request were entered in Epic:  Additional Comments: 11/12/12: [facility] contacted Theophilus Kinds ([phone]), supervisor of patient's  low-supervised housing to advise of discharge. Calls made and messages left for DSS APS SW Rory Percy [phone]) and patient's sister Alan Small [phone].   Hunt Oris, MSW, [place]

## 2012-11-03 NOTE — Progress Notes (Signed)
RN assessed pt this am, found there to be no UOP since arrival to unit via carelink from [place] early this am around 1. Foley was placed at [place] PTA to [facility]. Bladder scan showed less than 41ml present.  K+ is down from 6.6 to 6.0 this am. Notified MD of these findings. Will continue to monitor for further changes.

## 2012-11-03 NOTE — Progress Notes (Signed)
ANTIBIOTIC CONSULT NOTE - INITIAL  Pharmacy Consult for Zosyn Indication: Pyelonephritis  Allergies  Allergen Reactions  . Mellaril (Thioridazine) Other (See Comments)    Unknown   . Morphine And Related Itching  . Navane (Thiothixene) Other (See Comments)    GI Distress  . Thorazine (Chlorpromazine) Other (See Comments)    Unknown    Patient Measurements: Height: 6\' 1"  (185.4 cm) Weight: 145 lb 11.6 oz (66.1 kg) IBW/kg (Calculated) : 79.9   Vital Signs: Temp: 97.7 F (36.5 C) (06/04 0700) Temp src: Oral (06/04 0800) BP: 150/95 mmHg (06/04 0800) Pulse Rate: 100 (06/04 0500)    Recent Labs  11/03/12 0150 11/03/12 0605  WBC 22.4* 20.1*  HGB 9.7* 10.4*  PLT 178 184  CREATININE 10.76* 11.39*   Estimated Creatinine Clearance: 6.6 ml/min (by C-G formula based on Cr of 11.39).   Microbiology: Recent Results (from the past 720 hour(s))  MRSA PCR SCREENING     Status: None   Collection Time    11/03/12  2:29 AM      Result Value Range Status   MRSA by PCR NEGATIVE  NEGATIVE Final   Comment:            The GeneXpert MRSA Assay (FDA     approved for NASAL specimens     only), is one component of a     comprehensive MRSA colonization     surveillance program. It is not     intended to diagnose MRSA     infection nor to guide or     monitor treatment for     MRSA infections.   Medical History: Past Medical History  Diagnosis Date  . Mental disorder   . Hypothyroidism    Assessment: 57yo male admitted with AMS and ARF started on Zosyn for pyelonephritis. Scr up to 11.39, WBC 20.1, Tmax 98.4'F.  Goal of Therapy:  Infection erradication Clinical improvement  Plan:  Continue Zosyn 2.25g IV Q8hours F/u renal function for dosage adjustments F/u clinical status, culture data, LOT   Woodroe Chen, PharmD, BCPS 11/03/2012   10:34 AM

## 2012-11-03 NOTE — Progress Notes (Signed)
Critical Potassium (6.6) reported to Dr. Hal Hope who is currently at pt's bedside. Orders received, will continue to monitor and assess.

## 2012-11-03 NOTE — Progress Notes (Signed)
INITIAL NUTRITION ASSESSMENT  DOCUMENTATION CODES Per approved criteria  -Severe malnutrition in the context of chronic illness   INTERVENTION:  Diet advancement as able per MD to regular diet, will need PO supplements to meet nutrition needs.  NUTRITION DIAGNOSIS: Malnutrition related to suspected inadequate intake as evidenced by severe depletion of muscle mass and subcutaneous fat.   Goal: Intake to meet >90% of estimated nutrition needs.  Monitor:  Diet advancement, PO intake, labs, weight trend.  Reason for Assessment: Low [place]  57 y.o. male  Admitting Dx: Acute renal failure  ASSESSMENT: Patient admitted to [facility] 6/3 with increasing confusion; CT scan showed bilateral hydronephrosis. Transferred to [facility] 6/4. S/P placement of bilateral percutaneous nephrostomy tubes this morning. Remains NPO.  Nutrition Focused Physical Exam:  Subcutaneous Fat:  Orbital Region: severe depletion Upper Arm Region: severe depletion Thoracic and Lumbar Region: NA  Muscle:  Temple Region: severe depletion Clavicle Bone Region: severe depletion Clavicle and Acromion Bone Region: severe depletion Scapular Bone Region: NA   Dorsal Hand: severe depletion Patellar Region: severe depletion Anterior Thigh Region: severe depletion Posterior Calf Region: severe depletion  Edema: none  Height: Ht Readings from Last 1 Encounters:  11/03/12 6\' 1"  (1.854 m)    Weight: Wt Readings from Last 1 Encounters:  11/03/12 145 lb 11.6 oz (66.1 kg)    Ideal Body Weight: 83.6 kg  % Ideal Body Weight: 79%  Wt Readings from Last 10 Encounters:  11/03/12 145 lb 11.6 oz (66.1 kg)   Usual Body Weight: unknown  BMI:  Body mass index is 19.23 kg/(m^2).  Estimated Nutritional Needs: Kcal: 2000-2200 Protein: 100-120 gm Fluid: 2-2.2 L  Skin: surgical incisions on back; left heel abrasion; left hand laceration  Diet Order: NPO  EDUCATION NEEDS: -Education not appropriate at this  time   Intake/Output Summary (Last 24 hours) at 11/03/12 1342 Last data filed at 11/03/12 1200  Gross per 24 hour  Intake    210 ml  Output      0 ml  Net    210 ml    Last BM: 6/4   Labs:   Recent Labs Lab 11/03/12 0150 11/03/12 0605  NA 144 144  K 6.6* 6.0*  CL 110 111  CO2 14* 13*  BUN 151* 148*  CREATININE 10.76* 11.39*  CALCIUM 9.6 9.6  GLUCOSE 117* 97    CBG (last 3)   Recent Labs  11/03/12 0200 11/03/12 0352  GLUCAP 109* 167*    Scheduled Meds: . piperacillin-tazobactam (ZOSYN)  IV  2.25 g Intravenous Q8H  . sodium chloride  3 mL Intravenous Q12H    Continuous Infusions: . sodium chloride 10 mL/hr at 11/03/12 0309  . albuterol 10 mg/hr (11/03/12 0450)    Past Medical History  Diagnosis Date  . Mental disorder   . Hypothyroidism     Past Surgical History  Procedure Laterality Date  . Appendectomy      Molli Barrows, RD, LDN, [place] Pager [phone] After Hours Pager [phone]

## 2012-11-03 NOTE — Progress Notes (Signed)
[facility] PROGRESS NOTE  Alan Small [id number] DOB: [birth year] DOA: 11/03/2012 PCP: No primary provider on file.  Assessment/Plan: Acute Renal Failure -Secondary to bilateral hydronephrosis (per CT scan from [place]). -He is now s/p bilateral percutaneous nephrostomy tubes by IR. -Cr 11 today. Hope that this improves now that nephrostomy tubes are in. -If Cr does not start to decrease in the next 24 hours, will need to consult renal.  Bilateral Hydronephrosis -2/2 stones. -s/p percutaneous nephrostomy. -Urology consulted.  Acute Encephalopathy -Presumed uremic in addition to sepsis. -Follow for improvement. -Remains unresponsive today.  Hyperkalemia -2/2 ARF. -Improved from 6.6-->6.0. -Will give another kayexalate enema today. -EKG without acute changes.  Metabolic Acidosis -2/2 renal failure. -Will add bicarb to IVF.  Sepsis 2/2 Pyelonephritis -Continue zosyn pending cx data.  Leukocytosis -2/2 pyelonephritis. -Follow trend with antibiotics.  Code Status: Full Code Family Communication: Two sisters at bedside as well as a niece.  Disposition Plan: Not medically ready for DC.   Consultants:  Urology, Gaynelle Arabian   Antibiotics:  Zosyn day 2   Subjective: Unresponsive  Objective: Filed Vitals:   11/03/12 1116 11/03/12 1120 11/03/12 1121 11/03/12 1300  BP: 143/96 137/91    Pulse: 111 113    Temp:    97.5 F (36.4 C)  TempSrc:    Axillary  Resp: 16 16    Height:      Weight:      SpO2: 100% 100% 99%     Intake/Output Summary (Last 24 hours) at 11/03/12 1421 Last data filed at 11/03/12 1200  Gross per 24 hour  Intake    210 ml  Output      0 ml  Net    210 ml   Filed Weights   11/03/12 0121  Weight: 66.1 kg (145 lb 11.6 oz)    Exam:   General:  unresponsive  Cardiovascular: RRR, no M/R/G  Respiratory: CTA B  Abdomen: S/NT/ND/+BS  Extremities: no C/C/E   Neurologic:  unresponsive  Data Reviewed: Basic Metabolic  Panel:  Recent Labs Lab 11/03/12 0150 11/03/12 0605  NA 144 144  K 6.6* 6.0*  CL 110 111  CO2 14* 13*  GLUCOSE 117* 97  BUN 151* 148*  CREATININE 10.76* 11.39*  CALCIUM 9.6 9.6   Liver Function Tests:  Recent Labs Lab 11/03/12 0150  AST 24  ALT 25  ALKPHOS 95  BILITOT 0.2*  PROT 6.9  ALBUMIN 2.7*   No results found for this basename: LIPASE, AMYLASE,  in the last 168 hours No results found for this basename: AMMONIA,  in the last 168 hours CBC:  Recent Labs Lab 11/03/12 0150 11/03/12 0605  WBC 22.4* 20.1*  NEUTROABS 20.2*  --   HGB 9.7* 10.4*  HCT 28.8* 30.8*  MCV 83.2 82.4  PLT 178 184   Cardiac Enzymes: No results found for this basename: CKTOTAL, CKMB, CKMBINDEX, TROPONINI,  in the last 168 hours BNP (last 3 results) No results found for this basename: PROBNP,  in the last 8760 hours CBG:  Recent Labs Lab 11/03/12 0200 11/03/12 0352  GLUCAP 109* 167*    Recent Results (from the past 240 hour(s))  MRSA PCR SCREENING     Status: None   Collection Time    11/03/12  2:29 AM      Result Value Range Status   MRSA by PCR NEGATIVE  NEGATIVE Final   Comment:            The GeneXpert MRSA Assay (FDA  approved for NASAL specimens     only), is one component of a     comprehensive MRSA colonization     surveillance program. It is not     intended to diagnose MRSA     infection nor to guide or     monitor treatment for     MRSA infections.     Studies: Ir Perc Nephrostomy Left  11/03/2012   *RADIOLOGY REPORT*  Clinical Data: Bilateral hydronephrosis, elevated white blood cell count, nonfunctioning ureteral stent.  PERC NEPHROSTOMY*L*,PERC NEPHROSTOMY*R*,IR ULTRASOUND GUIDANCE TISSUE ABLATION  Fluoroscopy Time: 54 seconds  Comparison: None available.  Technique and findings:  The procedure, risks (including but not limited to bleeding, infection, organ damage), benefits, and alternatives were explained to the family.  Questions regarding the procedure  were encouraged and answered.  The family understands and consents to the procedure.Bilateralflank regions prepped with Betadine, draped in usual sterile fashion, infiltrated locally with 1% lidocaine.The patient was receiving adequate antibiotic coverage.  Intravenous Fentanyl and Versed were administered as conscious sedation during continuous cardiorespiratory monitoring by the radiology RN, with a total moderate sedation time of 12 minutes.   Under real-time ultrasound guidance, a 21-gauge trocar needle was advanced into a posterior right lower pole calyx. Ultrasound image documentation was saved. Urine spontaneously returned through the needle. Needle was exchanged over a guidewire for transitional dilator. Contrast injection confirmed appropriate positioning. Catheter was exchanged over a guidewire for a 10 French pigtail catheter, formed centrally within the right renal collecting system. Contrast injection confirms appropriate positioning and patency.  In similar fashion, under real-time ultrasound guidance, a 21-gauge trocar needle was advanced into a posterior left lower pole calyx. Ultrasound image documentation was saved. Urine spontaneously returned through the needle. Needle was exchanged over a guidewire for transitional dilator. Contrast injection confirmed appropriate positioning. Catheter was exchanged over a guidewire for a 10 French pigtail catheter, formed centrally within the  left  renal collecting system. Contrast injection confirms appropriate positioning and patency.  Catheters   secured externally with 0 Prolene suture and Statlock devices and placed to external drain bags. No immediate complication.  IMPRESSION Technically successful bilateral percutaneous nephrostomy catheter placement.   Original Report Authenticated By: D. Wallace Going, MD   Ir Perc Nephrostomy Right  11/03/2012   *RADIOLOGY REPORT*  Clinical Data: Bilateral hydronephrosis, elevated white blood cell count,  nonfunctioning ureteral stent.  PERC NEPHROSTOMY*L*,PERC NEPHROSTOMY*R*,IR ULTRASOUND GUIDANCE TISSUE ABLATION  Fluoroscopy Time: 54 seconds  Comparison: None available.  Technique and findings:  The procedure, risks (including but not limited to bleeding, infection, organ damage), benefits, and alternatives were explained to the family.  Questions regarding the procedure were encouraged and answered.  The family understands and consents to the procedure.Bilateralflank regions prepped with Betadine, draped in usual sterile fashion, infiltrated locally with 1% lidocaine.The patient was receiving adequate antibiotic coverage.  Intravenous Fentanyl and Versed were administered as conscious sedation during continuous cardiorespiratory monitoring by the radiology RN, with a total moderate sedation time of 12 minutes.   Under real-time ultrasound guidance, a 21-gauge trocar needle was advanced into a posterior right lower pole calyx. Ultrasound image documentation was saved. Urine spontaneously returned through the needle. Needle was exchanged over a guidewire for transitional dilator. Contrast injection confirmed appropriate positioning. Catheter was exchanged over a guidewire for a 10 French pigtail catheter, formed centrally within the right renal collecting system. Contrast injection confirms appropriate positioning and patency.  In similar fashion, under real-time ultrasound guidance, a 21-gauge trocar needle was  advanced into a posterior left lower pole calyx. Ultrasound image documentation was saved. Urine spontaneously returned through the needle. Needle was exchanged over a guidewire for transitional dilator. Contrast injection confirmed appropriate positioning. Catheter was exchanged over a guidewire for a 10 French pigtail catheter, formed centrally within the  left  renal collecting system. Contrast injection confirms appropriate positioning and patency.  Catheters   secured externally with 0 Prolene suture  and Statlock devices and placed to external drain bags. No immediate complication.  IMPRESSION Technically successful bilateral percutaneous nephrostomy catheter placement.   Original Report Authenticated By: D. Wallace Going, MD   Ir US Guide Bx Asp/drain  11/03/2012   *RADIOLOGY REPORT*  Clinical Data: Bilateral hydronephrosis, elevated white blood cell count, nonfunctioning ureteral stent.  PERC NEPHROSTOMY*L*,PERC NEPHROSTOMY*R*,IR ULTRASOUND GUIDANCE TISSUE ABLATION  Fluoroscopy Time: 54 seconds  Comparison: None available.  Technique and findings:  The procedure, risks (including but not limited to bleeding, infection, organ damage), benefits, and alternatives were explained to the family.  Questions regarding the procedure were encouraged and answered.  The family understands and consents to the procedure.Bilateralflank regions prepped with Betadine, draped in usual sterile fashion, infiltrated locally with 1% lidocaine.The patient was receiving adequate antibiotic coverage.  Intravenous Fentanyl and Versed were administered as conscious sedation during continuous cardiorespiratory monitoring by the radiology RN, with a total moderate sedation time of 12 minutes.   Under real-time ultrasound guidance, a 21-gauge trocar needle was advanced into a posterior right lower pole calyx. Ultrasound image documentation was saved. Urine spontaneously returned through the needle. Needle was exchanged over a guidewire for transitional dilator. Contrast injection confirmed appropriate positioning. Catheter was exchanged over a guidewire for a 10 French pigtail catheter, formed centrally within the right renal collecting system. Contrast injection confirms appropriate positioning and patency.  In similar fashion, under real-time ultrasound guidance, a 21-gauge trocar needle was advanced into a posterior left lower pole calyx. Ultrasound image documentation was saved. Urine spontaneously returned through the needle. Needle  was exchanged over a guidewire for transitional dilator. Contrast injection confirmed appropriate positioning. Catheter was exchanged over a guidewire for a 10 French pigtail catheter, formed centrally within the  left  renal collecting system. Contrast injection confirms appropriate positioning and patency.  Catheters   secured externally with 0 Prolene suture and Statlock devices and placed to external drain bags. No immediate complication.  IMPRESSION Technically successful bilateral percutaneous nephrostomy catheter placement.   Original Report Authenticated By: D. Wallace Going, MD   Ir US Guide Bx Asp/drain  11/03/2012   *RADIOLOGY REPORT*  Clinical Data: Bilateral hydronephrosis, elevated white blood cell count, nonfunctioning ureteral stent.  PERC NEPHROSTOMY*L*,PERC NEPHROSTOMY*R*,IR ULTRASOUND GUIDANCE TISSUE ABLATION  Fluoroscopy Time: 54 seconds  Comparison: None available.  Technique and findings:  The procedure, risks (including but not limited to bleeding, infection, organ damage), benefits, and alternatives were explained to the family.  Questions regarding the procedure were encouraged and answered.  The family understands and consents to the procedure.Bilateralflank regions prepped with Betadine, draped in usual sterile fashion, infiltrated locally with 1% lidocaine.The patient was receiving adequate antibiotic coverage.  Intravenous Fentanyl and Versed were administered as conscious sedation during continuous cardiorespiratory monitoring by the radiology RN, with a total moderate sedation time of 12 minutes.   Under real-time ultrasound guidance, a 21-gauge trocar needle was advanced into a posterior right lower pole calyx. Ultrasound image documentation was saved. Urine spontaneously returned through the needle. Needle was exchanged over a guidewire for transitional dilator. Contrast injection confirmed  appropriate positioning. Catheter was exchanged over a guidewire for a 10 French pigtail  catheter, formed centrally within the right renal collecting system. Contrast injection confirms appropriate positioning and patency.  In similar fashion, under real-time ultrasound guidance, a 21-gauge trocar needle was advanced into a posterior left lower pole calyx. Ultrasound image documentation was saved. Urine spontaneously returned through the needle. Needle was exchanged over a guidewire for transitional dilator. Contrast injection confirmed appropriate positioning. Catheter was exchanged over a guidewire for a 10 French pigtail catheter, formed centrally within the  left  renal collecting system. Contrast injection confirms appropriate positioning and patency.  Catheters   secured externally with 0 Prolene suture and Statlock devices and placed to external drain bags. No immediate complication.  IMPRESSION Technically successful bilateral percutaneous nephrostomy catheter placement.   Original Report Authenticated By: D. Wallace Going, MD   Dg Chest Port 1 View  11/03/2012   *RADIOLOGY REPORT*  Clinical Data: Follow up infiltrates  PORTABLE CHEST - 1 VIEW  Comparison: None  Findings: The heart size appears normal.  No pleural effusion identified.  No airspace consolidation.  Mild to moderate pulmonary vascular congestion is noted bilaterally.  The visualized bony structures appear intact.  IMPRESSION:  1.  Pulmonary vascular congestion.   Original Report Authenticated By: Kerby Moors, M.D.    Scheduled Meds: . piperacillin-tazobactam (ZOSYN)  IV  2.25 g Intravenous Q8H  . sodium chloride  3 mL Intravenous Q12H  . sodium polystyrene  30 g Rectal Once   Continuous Infusions: . sodium chloride 75 mL/hr at 11/03/12 1415  . albuterol 10 mg/hr (11/03/12 0450)    Principal Problem:   Acute renal failure Active Problems:   Hyperkalemia   Pyelonephritis   Schizophrenia   Hypothyroidism   Severe sepsis   Protein-calorie malnutrition, severe    Time spent: 45 minutes.    Lelon Frohlich  [facility] Pager [phone]  If 7PM-7AM, please contact night-coverage at [email or web address], password [facility] 11/03/2012, 2:21 PM  LOS: 0 days

## 2012-11-03 NOTE — H&P (Addendum)
[facility] History and Physical  CASTLE DOUGALL [id number] DOB: [birth year] DOA: 11/03/2012  Referring physician: Patient was transferred from [facility]. PCP: No primary provider on file.   History is obtained from ER physician at [facility] and patient's sister.  Chief Complaint: Increasing confusion.  HPI: Alan Small is a 57 y.o. male with known history of schizophrenia, hypothyroidism and ongoing tobacco abuse was found to be increasingly confused at his living facility. Patient was found confused with feces all over. Patient was taken to the ER at [facility]. Labs showed markedly elevated creatinine at around 10 and his creatinine 2 months ago was around 2.8 per ER physician. CT abdomen and pelvis showed bilateral hydronephrosis with obstruction. Patient did have a previous history of hydronephrosis requiring stent placement at [facility]. But since patient's stents were calcified the stents which were obstructed were difficult to be removed per the urologist who discussed with our urologist Dr. Gaynelle Arabian. Patient has been transferred to [facility] for percutaneous nephrostomy tube placement. Patient's labs also revealed markedly elevated white cell count with UA showing possibility of UTI and CT abdomen and pelvis showing left-sided perinephric stranding. On my exam patient appears confused and does not have any signs of acute abdomen. On arrival patient nurse noted that patient had mild purulent discharge from penis area.  Labs done in the ER at [facility] shows hyperkalemia with bicarbonate of 16 and VBG showing a pH of 7.38. EKG were showing some T-wave concerning for hyperkalemia. Patient was given calcium gluconate IV.  Review of Systems: As presented in the history of presenting illness, rest negative.  Past Medical History  Diagnosis Date  . Mental disorder   . Hypothyroidism     Past Surgical History  Procedure Laterality Date  . Appendectomy     Social History:  reports that he has been smoking.  He does not have any smokeless tobacco history on file. He reports that he does not drink alcohol or use illicit drugs. Lives in an apartment for the mentally challenged and where does patient live-- Not sure. Can patient participate in ADLs?  Not on File  Family History  Problem Relation Age of Onset  . Stroke Father       Prior to Admission medications   Not on File   Physical Exam: Filed Vitals:   11/03/12 0121 11/03/12 0200  BP: 150/94 154/96  Pulse:  93  Temp: 98.4 F (36.9 C)   TempSrc: Oral   Resp:  24  Height: 6\' 1"  (1.854 m)   Weight: 66.1 kg (145 lb 11.6 oz)   SpO2: 100%      General:  Well-developed and moderately nourished.  Eyes: Anicteric no pallor.  ENT: No discharge from the ears eyes nose or mouth.  Neck: No mass felt.  Cardiovascular: S1-S2 heard.  Respiratory: No rhonchi or crepitations.  Abdomen: Soft nontender bowel sounds present.  Skin: No rash.  Musculoskeletal: No edema.  Psychiatric: Patient is confused.  Neurologic: Moves all extremities.  Labs on Admission:  Basic Metabolic Panel:  Recent Labs Lab 11/03/12 0150  NA 144  K 6.6*  CL 110  CO2 14*  GLUCOSE 117*  BUN 151*  CREATININE 10.76*  CALCIUM 9.6   Liver Function Tests:  Recent Labs Lab 11/03/12 0150  AST 24  ALT 25  ALKPHOS 95  BILITOT 0.2*  PROT 6.9  ALBUMIN 2.7*   No results found for this basename: LIPASE,  AMYLASE,  in the last 168 hours No results found for this basename: AMMONIA,  in the last 168 hours CBC:  Recent Labs Lab 11/03/12 0150  WBC 22.4*  NEUTROABS 20.2*  HGB 9.7*  HCT 28.8*  MCV 83.2  PLT 178   Cardiac Enzymes: No results found for this basename: CKTOTAL, CKMB, CKMBINDEX, TROPONINI,  in the last 168 hours  BNP (last 3 results) No results found for this basename: PROBNP,  in the last 8760  hours CBG: No results found for this basename: GLUCAP,  in the last 168 hours  Radiological Exams on Admission: No results found.  EKG: Independently reviewed. Normal sinus rhythm with peak T waves in V3.  Assessment/Plan Principal Problem:   Acute renal failure Active Problems:   Hyperkalemia   Pyelonephritis   Schizophrenia   Hypothyroidism   1. Acute renal failure secondary to obstruction - I have discussed with on-call urologist Dr. Gaynelle Arabian and also on-call interventional radiologist Dr. Barbie Banner. Plan is to get bilateral percutaneous nephrostomy tube placement. 2. Acute encephalopathy probably secondary to #1 reason -  CT head was negative for anything acute at [facility].  3. Hyperkalemia in the setting of acute renal failure secondary to obstruction - Kayexalate 60 g per rectally, calcium gluconate IV, D50 one ampule, NovoLog insulin 10 units IV and albuterol has been ordered. Repeat metabolic panel in few hours. 4. Leukocytosis probably from pyelonephritis - patient has been placed on Zosyn. Blood cultures and urine cultures have been ordered. 5. Metabolic acidosis from renal failure - closely follow metabolic panel. Patient metabolic acidosis probably will improve with relief of obstruction and IV fluids after the relief. 6. History of hypothyroidism - continue home medications after confirming dose and check TSH. 7. History of schizophrenia - continue home medications once medications are confirmed.    Code Status: Full code.   Family Communication: Patient's sister through the phone. Patient states increased at [phone]. Ms Alan Small.   Disposition Plan: Admit to inpatient.    Amelia Macken N. [facility] Pager [phone].   If 7PM-7AM, please contact night-coverage [email or web address] Password [email or web address] 11/03/2012, 3:01 AM

## 2012-11-03 NOTE — Procedures (Signed)
Bilat 12f perc nephrostomy tubes No complication No blood loss. See complete dictation in [facility].

## 2012-11-04 DIAGNOSIS — G9340 Encephalopathy, unspecified: Secondary | ICD-10-CM

## 2012-11-04 LAB — GLUCOSE, CAPILLARY
Glucose-Capillary: 89 mg/dL (ref 70–99)
Glucose-Capillary: 95 mg/dL (ref 70–99)
Glucose-Capillary: 88 mg/dL (ref 70–99)
Glucose-Capillary: 78 mg/dL (ref 70–99)
Glucose-Capillary: 91 mg/dL (ref 70–99)

## 2012-11-04 LAB — CBC
HCT: 30.2 % — ABNORMAL LOW (ref 39.0–52.0)
MCH: 28 pg (ref 26.0–34.0)
MCV: 82.1 fL (ref 78.0–100.0)
Platelets: 221 10*3/uL (ref 150–400)
RDW: 17.9 % — ABNORMAL HIGH (ref 11.5–15.5)

## 2012-11-04 LAB — BASIC METABOLIC PANEL
BUN: 149 mg/dL — ABNORMAL HIGH (ref 6–23)
CO2: 20 mEq/L (ref 19–32)
Calcium: 9.3 mg/dL (ref 8.4–10.5)
Chloride: 114 mEq/L — ABNORMAL HIGH (ref 96–112)
Creatinine, Ser: 9.99 mg/dL — ABNORMAL HIGH (ref 0.50–1.35)
Glucose, Bld: 85 mg/dL (ref 70–99)

## 2012-11-04 LAB — URINE CULTURE

## 2012-11-04 MED ORDER — SODIUM BICARBONATE 8.4 % IV SOLN
INTRAVENOUS | Status: DC
  Administered 2012-11-04 – 2012-11-05 (×3): via INTRAVENOUS
  Filled 2012-11-04 (×10): qty 50

## 2012-11-04 NOTE — Progress Notes (Addendum)
Subjective: B PCNs placed 6/4 Intact Pt feels some better  Objective: Vital signs in last 24 hours: Temp:  [97.2 F (36.2 C)-98.1 F (36.7 C)] 98.1 F (36.7 C) (06/05 0329) Pulse Rate:  [78-113] 78 (06/05 0329) Resp:  [13-22] 14 (06/05 0329) BP: (108-153)/(64-100) 127/82 mmHg (06/05 0329) SpO2:  [96 %-100 %] 97 % (06/05 0329) Last BM Date: 11/03/12  Intake/Output from previous day: 06/04 0701 - 06/05 0700 In: 1355 [I.V.:1305; IV Piggyback:50] Out: 2900 [Urine:2900] Intake/Output this shift:    PE:  Afeb; VSS  Rt PCN intact Output great 2.7 Liters yesterday 100 cc in bag now- yellow Site clean and dry  Lt PCN intact Output much less 130 cc yesterday 30 cc in bag now- bloody Site clean and dry  Cxs pending Wbc down to 15.7 Cr down to 9.9  Lab Results:   Recent Labs  11/03/12 0605 11/04/12 0440  WBC 20.1* 15.7*  HGB 10.4* 10.3*  HCT 30.8* 30.2*  PLT 184 221   BMET  Recent Labs  11/03/12 0605 11/04/12 0440  NA 144 153*  K 6.0* 4.6  CL 111 114*  CO2 13* 20  GLUCOSE 97 85  BUN 148* 149*  CREATININE 11.39* 9.99*  CALCIUM 9.6 9.3   PT/INR  Recent Labs  11/03/12 0605  LABPROT 16.6*  INR 1.38   ABG  Recent Labs  11/03/12 0155  PHART 7.354  HCO3 15.2*    Studies/Results: Ir Perc Nephrostomy Left  11/03/2012   *RADIOLOGY REPORT*  Clinical Data: Bilateral hydronephrosis, elevated white blood cell count, nonfunctioning ureteral stent.  PERC NEPHROSTOMY*L*,PERC NEPHROSTOMY*R*,IR ULTRASOUND GUIDANCE TISSUE ABLATION  Fluoroscopy Time: 54 seconds  Comparison: None available.  Technique and findings:  The procedure, risks (including but not limited to bleeding, infection, organ damage), benefits, and alternatives were explained to the family.  Questions regarding the procedure were encouraged and answered.  The family understands and consents to the procedure.Bilateralflank regions prepped with Betadine, draped in usual sterile fashion,  infiltrated locally with 1% lidocaine.The patient was receiving adequate antibiotic coverage.  Intravenous Fentanyl and Versed were administered as conscious sedation during continuous cardiorespiratory monitoring by the radiology RN, with a total moderate sedation time of 12 minutes.   Under real-time ultrasound guidance, a 21-gauge trocar needle was advanced into a posterior right lower pole calyx. Ultrasound image documentation was saved. Urine spontaneously returned through the needle. Needle was exchanged over a guidewire for transitional dilator. Contrast injection confirmed appropriate positioning. Catheter was exchanged over a guidewire for a 10 French pigtail catheter, formed centrally within the right renal collecting system. Contrast injection confirms appropriate positioning and patency.  In similar fashion, under real-time ultrasound guidance, a 21-gauge trocar needle was advanced into a posterior left lower pole calyx. Ultrasound image documentation was saved. Urine spontaneously returned through the needle. Needle was exchanged over a guidewire for transitional dilator. Contrast injection confirmed appropriate positioning. Catheter was exchanged over a guidewire for a 10 French pigtail catheter, formed centrally within the  left  renal collecting system. Contrast injection confirms appropriate positioning and patency.  Catheters   secured externally with 0 Prolene suture and Statlock devices and placed to external drain bags. No immediate complication.  IMPRESSION Technically successful bilateral percutaneous nephrostomy catheter placement.   Original Report Authenticated By: D. Wallace Going, MD   Ir Perc Nephrostomy Right  11/03/2012   *RADIOLOGY REPORT*  Clinical Data: Bilateral hydronephrosis, elevated white blood cell count, nonfunctioning ureteral stent.  PERC NEPHROSTOMY*L*,PERC NEPHROSTOMY*R*,IR ULTRASOUND GUIDANCE TISSUE ABLATION  Fluoroscopy Time: 54 seconds  Comparison: None available.   Technique and findings:  The procedure, risks (including but not limited to bleeding, infection, organ damage), benefits, and alternatives were explained to the family.  Questions regarding the procedure were encouraged and answered.  The family understands and consents to the procedure.Bilateralflank regions prepped with Betadine, draped in usual sterile fashion, infiltrated locally with 1% lidocaine.The patient was receiving adequate antibiotic coverage.  Intravenous Fentanyl and Versed were administered as conscious sedation during continuous cardiorespiratory monitoring by the radiology RN, with a total moderate sedation time of 12 minutes.   Under real-time ultrasound guidance, a 21-gauge trocar needle was advanced into a posterior right lower pole calyx. Ultrasound image documentation was saved. Urine spontaneously returned through the needle. Needle was exchanged over a guidewire for transitional dilator. Contrast injection confirmed appropriate positioning. Catheter was exchanged over a guidewire for a 10 French pigtail catheter, formed centrally within the right renal collecting system. Contrast injection confirms appropriate positioning and patency.  In similar fashion, under real-time ultrasound guidance, a 21-gauge trocar needle was advanced into a posterior left lower pole calyx. Ultrasound image documentation was saved. Urine spontaneously returned through the needle. Needle was exchanged over a guidewire for transitional dilator. Contrast injection confirmed appropriate positioning. Catheter was exchanged over a guidewire for a 10 French pigtail catheter, formed centrally within the  left  renal collecting system. Contrast injection confirms appropriate positioning and patency.  Catheters   secured externally with 0 Prolene suture and Statlock devices and placed to external drain bags. No immediate complication.  IMPRESSION Technically successful bilateral percutaneous nephrostomy catheter placement.    Original Report Authenticated By: D. Wallace Going, MD   Ir US Guide Bx Asp/drain  11/03/2012   *RADIOLOGY REPORT*  Clinical Data: Bilateral hydronephrosis, elevated white blood cell count, nonfunctioning ureteral stent.  PERC NEPHROSTOMY*L*,PERC NEPHROSTOMY*R*,IR ULTRASOUND GUIDANCE TISSUE ABLATION  Fluoroscopy Time: 54 seconds  Comparison: None available.  Technique and findings:  The procedure, risks (including but not limited to bleeding, infection, organ damage), benefits, and alternatives were explained to the family.  Questions regarding the procedure were encouraged and answered.  The family understands and consents to the procedure.Bilateralflank regions prepped with Betadine, draped in usual sterile fashion, infiltrated locally with 1% lidocaine.The patient was receiving adequate antibiotic coverage.  Intravenous Fentanyl and Versed were administered as conscious sedation during continuous cardiorespiratory monitoring by the radiology RN, with a total moderate sedation time of 12 minutes.   Under real-time ultrasound guidance, a 21-gauge trocar needle was advanced into a posterior right lower pole calyx. Ultrasound image documentation was saved. Urine spontaneously returned through the needle. Needle was exchanged over a guidewire for transitional dilator. Contrast injection confirmed appropriate positioning. Catheter was exchanged over a guidewire for a 10 French pigtail catheter, formed centrally within the right renal collecting system. Contrast injection confirms appropriate positioning and patency.  In similar fashion, under real-time ultrasound guidance, a 21-gauge trocar needle was advanced into a posterior left lower pole calyx. Ultrasound image documentation was saved. Urine spontaneously returned through the needle. Needle was exchanged over a guidewire for transitional dilator. Contrast injection confirmed appropriate positioning. Catheter was exchanged over a guidewire for a 10 French pigtail  catheter, formed centrally within the  left  renal collecting system. Contrast injection confirms appropriate positioning and patency.  Catheters   secured externally with 0 Prolene suture and Statlock devices and placed to external drain bags. No immediate complication.  IMPRESSION Technically successful bilateral percutaneous nephrostomy catheter placement.  Original Report Authenticated By: D. Wallace Going, MD   Ir US Guide Bx Asp/drain  11/03/2012   *RADIOLOGY REPORT*  Clinical Data: Bilateral hydronephrosis, elevated white blood cell count, nonfunctioning ureteral stent.  PERC NEPHROSTOMY*L*,PERC NEPHROSTOMY*R*,IR ULTRASOUND GUIDANCE TISSUE ABLATION  Fluoroscopy Time: 54 seconds  Comparison: None available.  Technique and findings:  The procedure, risks (including but not limited to bleeding, infection, organ damage), benefits, and alternatives were explained to the family.  Questions regarding the procedure were encouraged and answered.  The family understands and consents to the procedure.Bilateralflank regions prepped with Betadine, draped in usual sterile fashion, infiltrated locally with 1% lidocaine.The patient was receiving adequate antibiotic coverage.  Intravenous Fentanyl and Versed were administered as conscious sedation during continuous cardiorespiratory monitoring by the radiology RN, with a total moderate sedation time of 12 minutes.   Under real-time ultrasound guidance, a 21-gauge trocar needle was advanced into a posterior right lower pole calyx. Ultrasound image documentation was saved. Urine spontaneously returned through the needle. Needle was exchanged over a guidewire for transitional dilator. Contrast injection confirmed appropriate positioning. Catheter was exchanged over a guidewire for a 10 French pigtail catheter, formed centrally within the right renal collecting system. Contrast injection confirms appropriate positioning and patency.  In similar fashion, under real-time  ultrasound guidance, a 21-gauge trocar needle was advanced into a posterior left lower pole calyx. Ultrasound image documentation was saved. Urine spontaneously returned through the needle. Needle was exchanged over a guidewire for transitional dilator. Contrast injection confirmed appropriate positioning. Catheter was exchanged over a guidewire for a 10 French pigtail catheter, formed centrally within the  left  renal collecting system. Contrast injection confirms appropriate positioning and patency.  Catheters   secured externally with 0 Prolene suture and Statlock devices and placed to external drain bags. No immediate complication.  IMPRESSION Technically successful bilateral percutaneous nephrostomy catheter placement.   Original Report Authenticated By: D. Wallace Going, MD   Dg Chest Port 1 View  11/03/2012   *RADIOLOGY REPORT*  Clinical Data: Follow up infiltrates  PORTABLE CHEST - 1 VIEW  Comparison: None  Findings: The heart size appears normal.  No pleural effusion identified.  No airspace consolidation.  Mild to moderate pulmonary vascular congestion is noted bilaterally.  The visualized bony structures appear intact.  IMPRESSION:  1.  Pulmonary vascular congestion.   Original Report Authenticated By: Kerby Moors, M.D.    Anti-infectives: Anti-infectives   Start     Dose/Rate Route Frequency Ordered Stop   11/03/12 1400  piperacillin-tazobactam (ZOSYN) IVPB 2.25 g     2.25 g 100 mL/hr over 30 Minutes Intravenous 3 times per day 11/03/12 0557     11/03/12 0330  piperacillin-tazobactam (ZOSYN) IVPB 2.25 g     2.25 g 100 mL/hr over 30 Minutes Intravenous  Once 11/03/12 0314 11/03/12 0450      Assessment/Plan: s/p * No surgery found * B PCNs placed 6/4 Some better Rt output yellow and good amt Lt bloody and small amt Flush this drain every shift with 10 cc sterile saline- order in Will follow   LOS: 1 day    Kaydon Creedon A 11/04/2012

## 2012-11-04 NOTE — Progress Notes (Signed)
[facility] Progress Note [place] TEAM 1 - Stepdown/ICU TEAM   Alan Small [id number] DOB: June 23, 1955 DOA: 11/03/2012 PCP: No primary provider on file.  Brief narrative: 57 year old male patient with history of schizophrenia and hypothyroidism. Was found to be increasingly confused at his primary residence/? Group home. He was also covered in feces. He was initially taken to the emergency department at [facility]. At that time laboratory data revealed markedly elevated serum creatinine of 10 with his baseline being 2.8 within the past 2 months. CT abdomen and pelvis revealed bilateral hydronephrosis with obstruction. Patient has an apparent history of hydronephrosis requiring stent placement at [facility]. Apparently the patient stands became calcified and were unable to be removed there were also obstructed based on most recent diagnostic imaging. He had been evaluated by a urologist at [facility] who discussed the case with the urologist at [facility] Era Bumpers. The patient was subsequently transferred to [facility] for placement of percutaneous nephrostomy tubes. In addition to the above abnormalities urinalysis was consistent with probable UTI and followup CT of the abdomen and pelvis ER revealed left-sided perinephric stranding. In addition laboratory data from the previous facility revealed hyperkalemia and metabolic acidosis an EKG was concerning for T-wave peaking consistent with hyperkalemia so the patient was given IV calcium gluconate.  Assessment/Plan: Acute Renal Failure due to obstructive uropathy -Secondary to bilateral hydronephrosis (per CT scan from [place]).  -He is now s/p bilateral percutaneous nephrostomy tubes by IR.  -Cr ~11 at admit and has trended down after perc nephro tubes placed -May need eventual renal eval but currently making excellent UOP   Bilateral Hydronephrosis  -2/2 stones/?  calcified ureteral stents ?.  -s/p percutaneous nephrostomy.  -Urology consulted but no formal note on chart at this time -will definitely require urology FU after dc to internalize stents vs other procedure especially given presentation and prior history  Acute Encephalopathy  -Presumed uremic -awake but confused today   Hyperkalemia/hypernatremia/metabolic acidosis -2/2 ARF-K+ stable and now normalized (received Kayexalate this admit) -Na+ increased so will decrease Bicarb to 1 amp and use D5W for base fluid -EKG without acute changes.  -follow labs  Sepsis 2/2 Pyelonephritis  -Continue zosyn pending cx data.   Leukocytosis  -2/2 pyelonephritis.  -trending downward on anbx's    DVT prophylaxis: SCDs Code Status: Full Family Communication: Patient only today noting he does have a sister who is involved in his care Disposition Plan: Stepdown- SNF vs LTAC after urological treatment clarified/ renal function stabilized Isolation: None Nutritional Status: Suspected acute protein calorie malnutrition related to acute uremia and altered mentation  Consultants: Interventional radiology Urology  Procedures: Insertion of bilateral percutaneous nephrostomy tubes interventional radiology 6/4  Antibiotics: Zosyn 6/3 >>>  HPI/Subjective: Asian awake but clearly confused and complete an adequate historian. Pleasant and not agitated. No apparent complaints.   Objective: Blood pressure 126/85, pulse 78, temperature 98.4 F (36.9 C), temperature source Oral, resp. rate 14, height 6\' 1"  (1.854 m), weight 66.1 kg (145 lb 11.6 oz), SpO2 97.00%.  Intake/Output Summary (Last 24 hours) at 11/04/12 1339 Last data filed at 11/04/12 1100  Gross per 24 hour  Intake   1275 ml  Output   3185 ml  Net  -1910 ml     Exam: General: No acute respiratory distress Lungs: Clear to auscultation bilaterally without wheezes or crackles, RA Cardiovascular: Regular rate and rhythm without murmur  gallop or rub normal S1 and S2, no peripheral  edema or JVD Abdomen: Nontender, nondistended, soft, bowel sounds positive, no rebound, no ascites, no appreciable mass Genitourinary: Bilateral percutaneous nephrostomy tubes in place with moderate amount of blood-tinged urine without purulence Musculoskeletal: No significant cyanosis, clubbing of bilateral lower extremities Neurological: Alert and oriented x name only, moves all extremities x 4 without focal neurological deficits, CN 2-12 intact  Scheduled Meds: Scheduled Meds: . piperacillin-tazobactam (ZOSYN)  IV  2.25 g Intravenous Q8H  . sodium chloride  3 mL Intravenous Q12H   Continuous Infusions: . albuterol 10 mg/hr (11/03/12 0450)  .  sodium bicarbonate  infusion 1000 mL      Data Reviewed: Basic Metabolic Panel:  Recent Labs Lab 11/03/12 0150 11/03/12 0605 11/04/12 0440  NA 144 144 153*  K 6.6* 6.0* 4.6  CL 110 111 114*  CO2 14* 13* 20  GLUCOSE 117* 97 85  BUN 151* 148* 149*  CREATININE 10.76* 11.39* 9.99*  CALCIUM 9.6 9.6 9.3   Liver Function Tests:  Recent Labs Lab 11/03/12 0150  AST 24  ALT 25  ALKPHOS 95  BILITOT 0.2*  PROT 6.9  ALBUMIN 2.7*   No results found for this basename: LIPASE, AMYLASE,  in the last 168 hours No results found for this basename: AMMONIA,  in the last 168 hours CBC:  Recent Labs Lab 11/03/12 0150 11/03/12 0605 11/04/12 0440  WBC 22.4* 20.1* 15.7*  NEUTROABS 20.2*  --   --   HGB 9.7* 10.4* 10.3*  HCT 28.8* 30.8* 30.2*  MCV 83.2 82.4 82.1  PLT 178 184 221   Cardiac Enzymes: No results found for this basename: CKTOTAL, CKMB, CKMBINDEX, TROPONINI,  in the last 168 hours BNP (last 3 results) No results found for this basename: PROBNP,  in the last 8760 hours CBG:  Recent Labs Lab 11/03/12 1946 11/03/12 2151 11/03/12 2325 11/04/12 0330 11/04/12 0813  GLUCAP 95 89 95 88 78    Recent Results (from the past 240 hour(s))  CULTURE, BLOOD (ROUTINE X 2)     Status:  None   Collection Time    11/03/12  1:45 AM      Result Value Range Status   Specimen Description BLOOD LEFT ARM   Final   Special Requests     Final   Value: BOTTLES DRAWN AEROBIC AND ANAEROBIC 6CC,PT ON ROCEPHIN   Culture  Setup Time 11/03/2012 10:47   Final   Culture     Final   Value:        BLOOD CULTURE RECEIVED NO GROWTH TO DATE CULTURE WILL BE HELD FOR 5 DAYS BEFORE ISSUING A FINAL NEGATIVE REPORT   Report Status PENDING   Incomplete  CULTURE, BLOOD (ROUTINE X 2)     Status: None   Collection Time    11/03/12  1:50 AM      Result Value Range Status   Specimen Description BLOOD RIGHT ARM   Final   Special Requests BOTTLES DRAWN AEROBIC ONLY 3CC,PT ON ROCEPHIN   Final   Culture  Setup Time 11/03/2012 10:48   Final   Culture     Final   Value:        BLOOD CULTURE RECEIVED NO GROWTH TO DATE CULTURE WILL BE HELD FOR 5 DAYS BEFORE ISSUING A FINAL NEGATIVE REPORT   Report Status PENDING   Incomplete  MRSA PCR SCREENING     Status: None   Collection Time    11/03/12  2:29 AM      Result Value Range Status  MRSA by PCR NEGATIVE  NEGATIVE Final   Comment:            The GeneXpert MRSA Assay (FDA     approved for NASAL specimens     only), is one component of a     comprehensive MRSA colonization     surveillance program. It is not     intended to diagnose MRSA     infection nor to guide or     monitor treatment for     MRSA infections.     Studies:  Recent x-ray studies have been reviewed in detail by the Attending Physician  Scheduled Meds:  Reviewed in detail by the Attending Physician   Erin Hearing, [place] [facility] Office  [phone] Pager [phone]  **If unable to reach the above provider after paging please contact the [place] @ [phone]  On-Call/Text Page:      [email or web address]      password [email or web address]  If 7PM-7AM, please contact night-coverage [email or web address] Password [email or web address] 11/04/2012, 1:39 PM   LOS: 1 day   I have examined the patient,  reviewed the chart and modified the above note which I agree with.   [id number] 11/04/2012, 5:21 PM

## 2012-11-05 LAB — RENAL FUNCTION PANEL
Albumin: 1.9 g/dL — ABNORMAL LOW (ref 3.5–5.2)
BUN: 100 mg/dL — ABNORMAL HIGH (ref 6–23)
Chloride: 101 mEq/L (ref 96–112)
Creatinine, Ser: 6.36 mg/dL — ABNORMAL HIGH (ref 0.50–1.35)
GFR calc non Af Amer: 9 mL/min — ABNORMAL LOW (ref >90)
Phosphorus: 4.6 mg/dL (ref 2.3–4.6)
Potassium: 3.6 mEq/L (ref 3.5–5.1)

## 2012-11-05 LAB — CBC
MCH: 28 pg (ref 26.0–34.0)
MCHC: 34.5 g/dL (ref 30.0–36.0)
MCV: 81.3 fL (ref 78.0–100.0)
Platelets: 206 10*3/uL (ref 150–400)
RBC: 3.53 MIL/uL — ABNORMAL LOW (ref 4.22–5.81)
RDW: 17.4 % — ABNORMAL HIGH (ref 11.5–15.5)

## 2012-11-05 MED ORDER — BOOST / RESOURCE BREEZE PO LIQD
1.0000 | Freq: Three times a day (TID) | ORAL | Status: DC
  Administered 2012-11-05 – 2012-11-09 (×12): 1 via ORAL

## 2012-11-05 NOTE — Progress Notes (Signed)
Clinical Education officer, museum (CSW) provided bed offers to pt sister. Pt confirmed pt wishes to remain in [facility] and would therefore prefer placement at [facility]. CSW to follow up with pt to confirm wishes. CSW to remain following for placement.  Hunt Oris, MSW, [place]

## 2012-11-05 NOTE — Progress Notes (Signed)
Subjective: B PCNs placed 6/4 Feeling better  Objective: Vital signs in last 24 hours: Temp:  [97.8 F (36.6 C)-98.5 F (36.9 C)] 97.8 F (36.6 C) (06/06 0340) Resp:  [15-21] 15 (06/06 0730) BP: (115-127)/(72-85) 127/84 mmHg (06/06 0725) SpO2:  [96 %-97 %] 96 % (06/05 1905) Last BM Date: 11/03/12  Intake/Output from previous day: 06/05 0701 - 06/06 0700 In: 2555 [P.O.:360; I.V.:2075; IV Piggyback:100] Out: 4565 [Urine:4565] Intake/Output this shift: Total I/O In: 50 [IV Piggyback:50] Out: 375 [Urine:375]  PE:  Afeb; VSS  Rt PCN: site clean and dry Output still slight 115 cc yesterday; less bloody Flushed without problem  Lt PCN: great output 3.8L yesterday- yellow 1 L in bag now Site clean and dry  Cr: 6.3 today!    Lab Results:   Recent Labs  11/04/12 0440 11/05/12 0510  WBC 15.7* 12.2*  HGB 10.3* 9.9*  HCT 30.2* 28.7*  PLT 221 206   BMET  Recent Labs  11/04/12 0440 11/05/12 0510  NA 153* 140  K 4.6 3.6  CL 114* 101  CO2 20 19  GLUCOSE 85 78  BUN 149* PENDING  CREATININE 9.99* 6.36*  CALCIUM 9.3 8.4   PT/INR  Recent Labs  11/03/12 0605  LABPROT 16.6*  INR 1.38   ABG  Recent Labs  11/03/12 0155  PHART 7.354  HCO3 15.2*    Studies/Results: Ir Perc Nephrostomy Left  11/03/2012   *RADIOLOGY REPORT*  Clinical Data: Bilateral hydronephrosis, elevated white blood cell count, nonfunctioning ureteral stent.  PERC NEPHROSTOMY*L*,PERC NEPHROSTOMY*R*,IR ULTRASOUND GUIDANCE TISSUE ABLATION  Fluoroscopy Time: 54 seconds  Comparison: None available.  Technique and findings:  The procedure, risks (including but not limited to bleeding, infection, organ damage), benefits, and alternatives were explained to the family.  Questions regarding the procedure were encouraged and answered.  The family understands and consents to the procedure.Bilateralflank regions prepped with Betadine, draped in usual sterile fashion, infiltrated locally with 1%  lidocaine.The patient was receiving adequate antibiotic coverage.  Intravenous Fentanyl and Versed were administered as conscious sedation during continuous cardiorespiratory monitoring by the radiology RN, with a total moderate sedation time of 12 minutes.   Under real-time ultrasound guidance, a 21-gauge trocar needle was advanced into a posterior right lower pole calyx. Ultrasound image documentation was saved. Urine spontaneously returned through the needle. Needle was exchanged over a guidewire for transitional dilator. Contrast injection confirmed appropriate positioning. Catheter was exchanged over a guidewire for a 10 French pigtail catheter, formed centrally within the right renal collecting system. Contrast injection confirms appropriate positioning and patency.  In similar fashion, under real-time ultrasound guidance, a 21-gauge trocar needle was advanced into a posterior left lower pole calyx. Ultrasound image documentation was saved. Urine spontaneously returned through the needle. Needle was exchanged over a guidewire for transitional dilator. Contrast injection confirmed appropriate positioning. Catheter was exchanged over a guidewire for a 10 French pigtail catheter, formed centrally within the  left  renal collecting system. Contrast injection confirms appropriate positioning and patency.  Catheters   secured externally with 0 Prolene suture and Statlock devices and placed to external drain bags. No immediate complication.  IMPRESSION Technically successful bilateral percutaneous nephrostomy catheter placement.   Original Report Authenticated By: D. Wallace Going, MD   Ir Perc Nephrostomy Right  11/03/2012   *RADIOLOGY REPORT*  Clinical Data: Bilateral hydronephrosis, elevated white blood cell count, nonfunctioning ureteral stent.  PERC NEPHROSTOMY*L*,PERC NEPHROSTOMY*R*,IR ULTRASOUND GUIDANCE TISSUE ABLATION  Fluoroscopy Time: 54 seconds  Comparison: None available.  Technique and  findings:  The  procedure, risks (including but not limited to bleeding, infection, organ damage), benefits, and alternatives were explained to the family.  Questions regarding the procedure were encouraged and answered.  The family understands and consents to the procedure.Bilateralflank regions prepped with Betadine, draped in usual sterile fashion, infiltrated locally with 1% lidocaine.The patient was receiving adequate antibiotic coverage.  Intravenous Fentanyl and Versed were administered as conscious sedation during continuous cardiorespiratory monitoring by the radiology RN, with a total moderate sedation time of 12 minutes.   Under real-time ultrasound guidance, a 21-gauge trocar needle was advanced into a posterior right lower pole calyx. Ultrasound image documentation was saved. Urine spontaneously returned through the needle. Needle was exchanged over a guidewire for transitional dilator. Contrast injection confirmed appropriate positioning. Catheter was exchanged over a guidewire for a 10 French pigtail catheter, formed centrally within the right renal collecting system. Contrast injection confirms appropriate positioning and patency.  In similar fashion, under real-time ultrasound guidance, a 21-gauge trocar needle was advanced into a posterior left lower pole calyx. Ultrasound image documentation was saved. Urine spontaneously returned through the needle. Needle was exchanged over a guidewire for transitional dilator. Contrast injection confirmed appropriate positioning. Catheter was exchanged over a guidewire for a 10 French pigtail catheter, formed centrally within the  left  renal collecting system. Contrast injection confirms appropriate positioning and patency.  Catheters   secured externally with 0 Prolene suture and Statlock devices and placed to external drain bags. No immediate complication.  IMPRESSION Technically successful bilateral percutaneous nephrostomy catheter placement.   Original Report  Authenticated By: D. Wallace Going, MD   Ir US Guide Bx Asp/drain  11/03/2012   *RADIOLOGY REPORT*  Clinical Data: Bilateral hydronephrosis, elevated white blood cell count, nonfunctioning ureteral stent.  PERC NEPHROSTOMY*L*,PERC NEPHROSTOMY*R*,IR ULTRASOUND GUIDANCE TISSUE ABLATION  Fluoroscopy Time: 54 seconds  Comparison: None available.  Technique and findings:  The procedure, risks (including but not limited to bleeding, infection, organ damage), benefits, and alternatives were explained to the family.  Questions regarding the procedure were encouraged and answered.  The family understands and consents to the procedure.Bilateralflank regions prepped with Betadine, draped in usual sterile fashion, infiltrated locally with 1% lidocaine.The patient was receiving adequate antibiotic coverage.  Intravenous Fentanyl and Versed were administered as conscious sedation during continuous cardiorespiratory monitoring by the radiology RN, with a total moderate sedation time of 12 minutes.   Under real-time ultrasound guidance, a 21-gauge trocar needle was advanced into a posterior right lower pole calyx. Ultrasound image documentation was saved. Urine spontaneously returned through the needle. Needle was exchanged over a guidewire for transitional dilator. Contrast injection confirmed appropriate positioning. Catheter was exchanged over a guidewire for a 10 French pigtail catheter, formed centrally within the right renal collecting system. Contrast injection confirms appropriate positioning and patency.  In similar fashion, under real-time ultrasound guidance, a 21-gauge trocar needle was advanced into a posterior left lower pole calyx. Ultrasound image documentation was saved. Urine spontaneously returned through the needle. Needle was exchanged over a guidewire for transitional dilator. Contrast injection confirmed appropriate positioning. Catheter was exchanged over a guidewire for a 10 French pigtail catheter, formed  centrally within the  left  renal collecting system. Contrast injection confirms appropriate positioning and patency.  Catheters   secured externally with 0 Prolene suture and Statlock devices and placed to external drain bags. No immediate complication.  IMPRESSION Technically successful bilateral percutaneous nephrostomy catheter placement.   Original Report Authenticated By: D. Wallace Going, MD   Ir  US Guide Bx Asp/drain  11/03/2012   *RADIOLOGY REPORT*  Clinical Data: Bilateral hydronephrosis, elevated white blood cell count, nonfunctioning ureteral stent.  PERC NEPHROSTOMY*L*,PERC NEPHROSTOMY*R*,IR ULTRASOUND GUIDANCE TISSUE ABLATION  Fluoroscopy Time: 54 seconds  Comparison: None available.  Technique and findings:  The procedure, risks (including but not limited to bleeding, infection, organ damage), benefits, and alternatives were explained to the family.  Questions regarding the procedure were encouraged and answered.  The family understands and consents to the procedure.Bilateralflank regions prepped with Betadine, draped in usual sterile fashion, infiltrated locally with 1% lidocaine.The patient was receiving adequate antibiotic coverage.  Intravenous Fentanyl and Versed were administered as conscious sedation during continuous cardiorespiratory monitoring by the radiology RN, with a total moderate sedation time of 12 minutes.   Under real-time ultrasound guidance, a 21-gauge trocar needle was advanced into a posterior right lower pole calyx. Ultrasound image documentation was saved. Urine spontaneously returned through the needle. Needle was exchanged over a guidewire for transitional dilator. Contrast injection confirmed appropriate positioning. Catheter was exchanged over a guidewire for a 10 French pigtail catheter, formed centrally within the right renal collecting system. Contrast injection confirms appropriate positioning and patency.  In similar fashion, under real-time ultrasound guidance, a  21-gauge trocar needle was advanced into a posterior left lower pole calyx. Ultrasound image documentation was saved. Urine spontaneously returned through the needle. Needle was exchanged over a guidewire for transitional dilator. Contrast injection confirmed appropriate positioning. Catheter was exchanged over a guidewire for a 10 French pigtail catheter, formed centrally within the  left  renal collecting system. Contrast injection confirms appropriate positioning and patency.  Catheters   secured externally with 0 Prolene suture and Statlock devices and placed to external drain bags. No immediate complication.  IMPRESSION Technically successful bilateral percutaneous nephrostomy catheter placement.   Original Report Authenticated By: D. Wallace Going, MD    Anti-infectives: Anti-infectives   Start     Dose/Rate Route Frequency Ordered Stop   11/03/12 1400  piperacillin-tazobactam (ZOSYN) IVPB 2.25 g     2.25 g 100 mL/hr over 30 Minutes Intravenous 3 times per day 11/03/12 0557     11/03/12 0330  piperacillin-tazobactam (ZOSYN) IVPB 2.25 g     2.25 g 100 mL/hr over 30 Minutes Intravenous  Once 11/03/12 0314 11/03/12 0450      Assessment/Plan: s/p * No surgery found *  B PCNs placed 6/4 Doing well L more output than Rt Rt less bloody today- flushes well Cr trending down Plan per Urology- ? Stents?   LOS: 2 days    Selena Swaminathan A 11/05/2012

## 2012-11-05 NOTE — Progress Notes (Signed)
Pt. Transferred to room 6735; belongings with patient; vss.  Report given to [place], Therapist, sports.  [place] RN

## 2012-11-05 NOTE — Progress Notes (Signed)
NUTRITION FOLLOW UP  Intervention:    Recommend liberalize diet to regular to help improve PO intake, since potassium and phosphorus are WNL.  Add PO supplement: Resource Breeze po TID, each supplement provides 250 kcal and 9 grams of protein.  Nutrition Dx:   Malnutrition related to suspected inadequate intake as evidenced by severe depletion of muscle mass and subcutaneous fat. Ongoing.  Goal:   Intake to meet >90% of estimated nutrition needs. Unmet.  Monitor:   PO intake, labs, weight trend.  Assessment:   Diet has been advanced to renal 60/70-2-2. PO intake has been fair per RN; consuming ~50% of meals. Patient with severe malnutrition in the context of chronic illness, given severe depletion of muscle mass and subcutaneous fat. Renal diet is low in phosphorus, sodium, and potassium; it is designed for a dialysis patient with elevated phosphorus and potassium levels. Serum phosphorus and potassium are now WNL; no need to continue Renal diet at this time. Recommend liberalizing diet to regular to help increase PO intake.  Height: Ht Readings from Last 1 Encounters:  11/03/12 6\' 1"  (1.854 m)    Weight Status:   Wt Readings from Last 1 Encounters:  11/03/12 145 lb 11.6 oz (66.1 kg)   No new weight available.  Re-estimated needs:  Kcal: 2000-2200  Protein: 100-120 gm  Fluid: 2-2.2 L  Skin: surgical incisions on back; left heel abrasion; left hand laceration  Diet Order: Renal   Intake/Output Summary (Last 24 hours) at 11/05/12 1459 Last data filed at 11/05/12 1329  Gross per 24 hour  Intake   2480 ml  Output   5050 ml  Net  -2570 ml    Last BM: 6/4   Labs:   Recent Labs Lab 11/03/12 0605 11/04/12 0440 11/05/12 0510  NA 144 153* 140  K 6.0* 4.6 3.6  CL 111 114* 101  CO2 13* 20 19  BUN 148* 149* 100*  CREATININE 11.39* 9.99* 6.36*  CALCIUM 9.6 9.3 8.4  PHOS  --   --  4.6  GLUCOSE 97 85 78    CBG (last 3)   Recent Labs  11/04/12 0813  11/04/12 2137 11/05/12 0415  GLUCAP 78 91 93    Scheduled Meds: . piperacillin-tazobactam (ZOSYN)  IV  2.25 g Intravenous Q8H  . sodium chloride  3 mL Intravenous Q12H    Continuous Infusions: . albuterol 10 mg/hr (11/03/12 0450)  .  sodium bicarbonate  infusion 1000 mL 100 mL/hr at 11/04/12 2109    Molli Barrows, [place], [place], [place] Pager [phone] After Hours Pager [phone]

## 2012-11-05 NOTE — Progress Notes (Signed)
[facility] Progress Note Alan Small TEAM 1 - Stepdown/ICU TEAM   Alan Small [id number] DOB: Dec 16, 1955 DOA: 11/03/2012 PCP: No primary provider on file.  Brief narrative: 57 year old male patient with history of schizophrenia and hypothyroidism. Was found to be increasingly confused at his primary residence/? Group home. He was also covered in feces. He was initially taken to the emergency department at [facility]. At that time laboratory data revealed markedly elevated serum creatinine of 10 with his baseline being 2.8 within the past 2 months. CT abdomen and pelvis revealed bilateral hydronephrosis with obstruction. Patient has an apparent history of hydronephrosis requiring stent placement at [facility]. Apparently the patient stands became calcified and were unable to be removed there were also obstructed based on most recent diagnostic imaging. He had been evaluated by a urologist at [facility] who discussed the case with the urologist at [place] Dr. Era Bumpers. The patient was subsequently transferred to [facility] for placement of percutaneous nephrostomy tubes. In addition to the above abnormalities urinalysis was consistent with probable UTI and followup CT of the abdomen and pelvis ER revealed left-sided perinephric stranding. In addition laboratory data from the previous facility revealed hyperkalemia and metabolic acidosis an EKG was concerning for T-wave peaking consistent with hyperkalemia so the patient was given IV calcium gluconate.  Assessment/Plan: Acute Renal Failure due to obstructive uropathy -Secondary to bilateral hydronephrosis (per CT scan from [place]).  -He is now s/p bilateral percutaneous nephrostomy tubes by IR.  -Cr ~11 at admit and has trended down after perc nephro tubes placed -May need eventual renal eval but currently making excellent UOP   Bilateral Hydronephrosis  -2/2 stones/?  calcified ureteral stents ?.  -s/p percutaneous nephrostomy.  -Urology consulted at [facility] by EDP- have re contacted office today/Alan Small -will definitely require urology FU after dc to internalize stents vs other procedure especially given presentation and prior history  Acute Encephalopathy  -Presumed uremic -awake and less confused today - seems at baseline  Hyperkalemia/hypernatremia/metabolic acidosis -2/2 ARF-K+ stable and now normalized (received Kayexalate this admit) -Na+ increased  (6/5) so decreased Bicarb to 1 amp and changed to D5W for base fluid- Na+ now normal -EKG without acute changes.  -follow labs  Sepsis 2/2 Pyelonephritis  -Continue zosyn pending cx data.   Leukocytosis  -2/2 pyelonephritis.  -trending downward on anbx's    DVT prophylaxis: SCDs Code Status: Full Family Communication: Patient only today noting he does have a sister who is involved in his care Disposition Plan: Transfer to 2- SNF vs LTAC after urological treatment clarified/ renal function stabilized Isolation: None Nutritional Status: Suspected acute protein calorie malnutrition related to acute uremia and altered mentation  Consultants: Interventional radiology Urology  Procedures: Insertion of bilateral percutaneous nephrostomy tubes interventional radiology 6/4  Antibiotics: Zosyn 6/3 >>>  HPI/Subjective: Awake and less confused. MD once again explained why has developed obstructive uropathy/ARF- pt verbalized understanding.   Objective: Blood pressure 124/81, pulse 80, temperature 97.8 F (36.6 C), temperature source Oral, resp. rate 15, height 6\' 1"  (1.854 m), weight 66.1 kg (145 lb 11.6 oz), SpO2 100.00%.  Intake/Output Summary (Last 24 hours) at 11/05/12 1442 Last data filed at 11/05/12 1329  Gross per 24 hour  Intake   2480 ml  Output   5050 ml  Net  -2570 ml     Exam: General: No acute respiratory distress Lungs: Clear to auscultation  bilaterally without wheezes or crackles, RA Cardiovascular: Regular rate and  rhythm without murmur gallop or rub normal S1 and S2, no peripheral edema or JVD Abdomen: Nontender, nondistended, soft, bowel sounds positive, no rebound, no ascites, no appreciable mass Genitourinary: Bilateral percutaneous nephrostomy tubes in place with moderate amount of blood-tinged urine without purulence Musculoskeletal: No significant cyanosis, clubbing of bilateral lower extremities Neurological: Alert and oriented x name and place, moves all extremities x 4 without focal neurological deficits, CN 2-12 intact  Scheduled Meds: Scheduled Meds: . piperacillin-tazobactam (ZOSYN)  IV  2.25 g Intravenous Q8H  . sodium chloride  3 mL Intravenous Q12H   Continuous Infusions: . albuterol 10 mg/hr (11/03/12 0450)  .  sodium bicarbonate  infusion 1000 mL 100 mL/hr at 11/04/12 2109    Data Reviewed: Basic Metabolic Panel:  Recent Labs Lab 11/03/12 0150 11/03/12 0605 11/04/12 0440 11/05/12 0510  NA 144 144 153* 140  K 6.6* 6.0* 4.6 3.6  CL 110 111 114* 101  CO2 14* 13* 20 19  GLUCOSE 117* 97 85 78  BUN 151* 148* 149* 100*  CREATININE 10.76* 11.39* 9.99* 6.36*  CALCIUM 9.6 9.6 9.3 8.4  PHOS  --   --   --  4.6   Liver Function Tests:  Recent Labs Lab 11/03/12 0150 11/05/12 0510  AST 24  --   ALT 25  --   ALKPHOS 95  --   BILITOT 0.2*  --   PROT 6.9  --   ALBUMIN 2.7* 1.9*   No results found for this basename: LIPASE, AMYLASE,  in the last 168 hours No results found for this basename: AMMONIA,  in the last 168 hours CBC:  Recent Labs Lab 11/03/12 0150 11/03/12 0605 11/04/12 0440 11/05/12 0510  WBC 22.4* 20.1* 15.7* 12.2*  NEUTROABS 20.2*  --   --   --   HGB 9.7* 10.4* 10.3* 9.9*  HCT 28.8* 30.8* 30.2* 28.7*  MCV 83.2 82.4 82.1 81.3  PLT 178 184 221 206   Cardiac Enzymes: No results found for this basename: CKTOTAL, CKMB, CKMBINDEX, TROPONINI,  in the last 168 hours BNP (last 3  results) No results found for this basename: PROBNP,  in the last 8760 hours CBG:  Recent Labs Lab 11/03/12 2325 11/04/12 0330 11/04/12 0813 11/04/12 2137 11/05/12 0415  GLUCAP 95 88 78 91 93    Recent Results (from the past 240 hour(s))  CULTURE, BLOOD (ROUTINE X 2)     Status: None   Collection Time    11/03/12  1:45 AM      Result Value Range Status   Specimen Description BLOOD LEFT ARM   Final   Special Requests     Final   Value: BOTTLES DRAWN AEROBIC AND ANAEROBIC 6CC,PT ON ROCEPHIN   Culture  Setup Time 11/03/2012 10:47   Final   Culture     Final   Value:        BLOOD CULTURE RECEIVED NO GROWTH TO DATE CULTURE WILL BE HELD FOR 5 DAYS BEFORE ISSUING A FINAL NEGATIVE REPORT   Report Status PENDING   Incomplete  CULTURE, BLOOD (ROUTINE X 2)     Status: None   Collection Time    11/03/12  1:50 AM      Result Value Range Status   Specimen Description BLOOD RIGHT ARM   Final   Special Requests BOTTLES DRAWN AEROBIC ONLY 3CC,PT ON ROCEPHIN   Final   Culture  Setup Time 11/03/2012 10:48   Final   Culture     Final   Value:  BLOOD CULTURE RECEIVED NO GROWTH TO DATE CULTURE WILL BE HELD FOR 5 DAYS BEFORE ISSUING A FINAL NEGATIVE REPORT   Report Status PENDING   Incomplete  MRSA PCR SCREENING     Status: None   Collection Time    11/03/12  2:29 AM      Result Value Range Status   MRSA by PCR NEGATIVE  NEGATIVE Final   Comment:            The GeneXpert MRSA Assay (FDA     approved for NASAL specimens     only), is one component of a     comprehensive MRSA colonization     surveillance program. It is not     intended to diagnose MRSA     infection nor to guide or     monitor treatment for     MRSA infections.  URINE CULTURE     Status: None   Collection Time    11/03/12  2:02 PM      Result Value Range Status   Specimen Description URINE, CLEAN CATCH   Final   Special Requests NONE   Final   Culture  Setup Time 11/03/2012 22:56   Final   Colony Count PENDING    Incomplete   Culture Culture reincubated for better growth   Final   Report Status PENDING   Incomplete     Studies:  Recent x-ray studies have been reviewed in detail by the Attending Physician  Scheduled Meds:  Reviewed in detail by the Attending Physician   Erin Hearing, [place] [facility] Office  [phone] Pager [phone]  **If unable to reach the above provider after paging please contact the [facility] @ [phone]  On-Small/Text Page:      [email or web address]      password [email or web address]  If 7PM-7AM, please contact night-coverage [email or web address] Password [email or web address] 11/05/2012, 2:42 PM   LOS: 2 days   I have examined the patient, reviewed the chart and modified the above note which I agree with.   [id number] 11/05/2012, 3:08 PM

## 2012-11-05 NOTE — Consult Note (Signed)
Urology Consult  Referring physician:   Rizwan Reason for referral:   calcifed stents  Chief Complaint:   HPI: Alan Small is a 57 y.o. male with known history of schizophrenia, hypothyroidism and ongoing tobacco abuse was found to be increasingly confused at his living facility. Patient was found confused with feces all over. Patient was taken to the ER at [facility]. Labs showed markedly elevated creatinine at around 10 and his creatinine 2 months ago was around 2.8 per ER physician. CT abdomen and pelvis showed bilateral hydronephrosis with obstruction. Patient did have a previous history of hydronephrosis requiring stent placement at [facility]. But since patient's stents were calcified the stents which were obstructed were difficult to be removed per the urologist who discussed with our urologist Dr. Gaynelle Arabian.  Pt was discussed with [facility] , but PT NEVER ACCEPTED IN TRANSFER. I discussed the case with Dr. Jacqlyn Larsen, who noted that he had operated on patient, and that stent had calcified, and that he could not get the stent out without using the laser, etc. He would need urgent percutaneous nephrostomy tube placement. , which is not available at [facility].  Dr. Jacqlyn Larsen was advised to either have his Medical Hospitalists transfer patient to  [facility], or transfer to [facility] ( Dr. Jacqlyn Larsen is on staff).    After patient arrived at [facility] facility, I was called, and advised MD that pt would need percutaneous nephrostomy ( ies) per Interventional Radiology. If his renal function were to recover, then pt could be transferred back to [facility], for his continued care ( will need cystoscopy and laser fragmentation of encrusted stents and removal). . Patient's labs also revealed markedly elevated white cell count with UA showing possibility of UTI and CT abdomen and pelvis showing left-sided perinephric stranding.  Patient appears confused and does not have  any signs of acute abdomen.   He has undergone bilateral percutaneous nephrostomytubes,and Cr has dropped from 10 to 6.36, and BUN has dropped from 149 to 100. GFR increased from 5-9, and 6-10 ( unknown side). WBC 15,7 to 12,200. Hgb decreased to 9.9 today ( hct 27) . K 4.6--3.6 ( was 6.6 on admission).     Past Medical History  Diagnosis Date  . Mental disorder   . Hypothyroidism    Past Surgical History  Procedure Laterality Date  . Appendectomy      Medications: I have reviewed the patient's current medications.Antibiotic: Zosyn Allergies:  Allergies  Allergen Reactions  . Mellaril (Thioridazine) Other (See Comments)    Unknown   . Morphine And Related Itching  . Navane (Thiothixene) Other (See Comments)    GI Distress  . Thorazine (Chlorpromazine) Other (See Comments)    Unknown    Family History  Problem Relation Age of Onset  . Stroke Father    Social History:  reports that he has been smoking.  He does not have any smokeless tobacco history on file. He reports that he does not drink alcohol or use illicit drugs.  ROS: All systems are reviewed and negative except as noted. Pt unable to discuss ROS.   Physical Exam:  Vital signs in last 24 hours: Temp:  [97.8 F (36.6 C)-98.5 F (36.9 C)] 98.2 F (36.8 C) (06/06 1747) Pulse Rate:  [80] 80 (06/06 1747) Resp:  [15-21] 18 (06/06 1747) BP: (115-127)/(72-84) 119/77 mmHg (06/06 1747) SpO2:  [96 %-100 %] 100 % (06/06 1747)  Cardiovascular: Skin warm; not flushed Respiratory: Breaths quiet;  no shortness of breath Abdomen: No masses Neurological: Normal sensation to touch Musculoskeletal: Normal motor function arms and legs Lymphatics: No inguinal adenopathy Skin: No rashes Genitourinary: wnl. Bilateral perc tubes in place.   Laboratory Data:  Results for orders placed during the hospital encounter of 11/03/12 (from the past 72 hour(s))  CULTURE, BLOOD (ROUTINE X 2)     Status: None   Collection Time     11/03/12  1:45 AM      Result Value Range   Specimen Description BLOOD LEFT ARM     Special Requests       Value: BOTTLES DRAWN AEROBIC AND ANAEROBIC 6CC,PT ON ROCEPHIN   Culture  Setup Time 11/03/2012 10:47     Culture       Value:        BLOOD CULTURE RECEIVED NO GROWTH TO DATE CULTURE WILL BE HELD FOR 5 DAYS BEFORE ISSUING A FINAL NEGATIVE REPORT   Report Status PENDING    COMPREHENSIVE METABOLIC PANEL     Status: Abnormal   Collection Time    11/03/12  1:50 AM      Result Value Range   Sodium 144  135 - 145 mEq/L   Potassium 6.6 (*) 3.5 - 5.1 mEq/L   Comment: NO VISIBLE HEMOLYSIS     CRITICAL RESULT CALLED TO, READ BACK BY AND VERIFIED WITH:     YORK M,RN 11/03/12 0230 WAYK   Chloride 110  96 - 112 mEq/L   CO2 14 (*) 19 - 32 mEq/L   Glucose, Bld 117 (*) 70 - 99 mg/dL   BUN 151 (*) 6 - 23 mg/dL   Creatinine, Ser 10.76 (*) 0.50 - 1.35 mg/dL   Calcium 9.6  8.4 - 10.5 mg/dL   Total Protein 6.9  6.0 - 8.3 g/dL   Albumin 2.7 (*) 3.5 - 5.2 g/dL   AST 24  0 - 37 U/L   ALT 25  0 - 53 U/L   Alkaline Phosphatase 95  39 - 117 U/L   Total Bilirubin 0.2 (*) 0.3 - 1.2 mg/dL   GFR calc non Af Amer 5 (*) >90 mL/min   GFR calc Af Amer 5 (*) >90 mL/min   Comment:            The eGFR has been calculated     using the CKD EPI equation.     This calculation has not been     validated in all clinical     situations.     eGFR's persistently     <90 mL/min signify     possible Chronic Kidney Disease.  CBC WITH DIFFERENTIAL     Status: Abnormal   Collection Time    11/03/12  1:50 AM      Result Value Range   WBC 22.4 (*) 4.0 - 10.5 K/uL   RBC 3.46 (*) 4.22 - 5.81 MIL/uL   Hemoglobin 9.7 (*) 13.0 - 17.0 g/dL   HCT 28.8 (*) 39.0 - 52.0 %   MCV 83.2  78.0 - 100.0 fL   MCH 28.0  26.0 - 34.0 pg   MCHC 33.7  30.0 - 36.0 g/dL   RDW 17.6 (*) 11.5 - 15.5 %   Platelets 178  150 - 400 K/uL   Neutrophils Relative % 90 (*) 43 - 77 %   Neutro Abs 20.2 (*) 1.7 - 7.7 K/uL   Lymphocytes Relative 4  (*) 12 - 46 %   Lymphs Abs 0.8  0.7 - 4.0 K/uL  Monocytes Relative 6  3 - 12 %   Monocytes Absolute 1.4 (*) 0.1 - 1.0 K/uL   Eosinophils Relative 0  0 - 5 %   Eosinophils Absolute 0.0  0.0 - 0.7 K/uL   Basophils Relative 0  0 - 1 %   Basophils Absolute 0.0  0.0 - 0.1 K/uL  CULTURE, BLOOD (ROUTINE X 2)     Status: None   Collection Time    11/03/12  1:50 AM      Result Value Range   Specimen Description BLOOD RIGHT ARM     Special Requests BOTTLES DRAWN AEROBIC ONLY 3CC,PT ON ROCEPHIN     Culture  Setup Time 11/03/2012 10:48     Culture       Value:        BLOOD CULTURE RECEIVED NO GROWTH TO DATE CULTURE WILL BE HELD FOR 5 DAYS BEFORE ISSUING A FINAL NEGATIVE REPORT   Report Status PENDING    BLOOD GAS, ARTERIAL     Status: Abnormal   Collection Time    11/03/12  1:55 AM      Result Value Range   Delivery systems ROOM AIR     pH, Arterial 7.354  7.350 - 7.450   pCO2 arterial 27.9 (*) 35.0 - 45.0 mmHg   pO2, Arterial 48.6 (*) 80.0 - 100.0 mmHg   Bicarbonate 15.2 (*) 20.0 - 24.0 mEq/L   TCO2 16.0  0 - 100 mmol/L   Acid-base deficit 9.3 (*) 0.0 - 2.0 mmol/L   O2 Saturation 80.8     Patient temperature 98.4     Collection site RIGHT RADIAL     Drawn by [id number]     Sample type ARTERIAL     Allens test (pass/fail) PASS  PASS  GLUCOSE, CAPILLARY     Status: Abnormal   Collection Time    11/03/12  2:00 AM      Result Value Range   Glucose-Capillary 109 (*) 70 - 99 mg/dL   Comment 1 Notify RN     Comment 2 Documented in Chart    MRSA PCR SCREENING     Status: None   Collection Time    11/03/12  2:29 AM      Result Value Range   MRSA by PCR NEGATIVE  NEGATIVE   Comment:            The GeneXpert MRSA Assay (FDA     approved for NASAL specimens     only), is one component of a     comprehensive MRSA colonization     surveillance program. It is not     intended to diagnose MRSA     infection nor to guide or     monitor treatment for     MRSA infections.  GLUCOSE, CAPILLARY      Status: Abnormal   Collection Time    11/03/12  3:52 AM      Result Value Range   Glucose-Capillary 167 (*) 70 - 99 mg/dL   Comment 1 Notify RN     Comment 2 Documented in Chart    PROTIME-INR     Status: Abnormal   Collection Time    11/03/12  6:05 AM      Result Value Range   Prothrombin Time 16.6 (*) 11.6 - 15.2 seconds   INR 1.38  0.00 - [id number]  BASIC METABOLIC PANEL     Status: Abnormal   Collection Time    11/03/12  6:05 AM  Result Value Range   Sodium 144  135 - 145 mEq/L   Potassium 6.0 (*) 3.5 - 5.1 mEq/L   Chloride 111  96 - 112 mEq/L   CO2 13 (*) 19 - 32 mEq/L   Glucose, Bld 97  70 - 99 mg/dL   BUN 148 (*) 6 - 23 mg/dL   Creatinine, Ser 11.39 (*) 0.50 - 1.35 mg/dL   Calcium 9.6  8.4 - 10.5 mg/dL   GFR calc non Af Amer 4 (*) >90 mL/min   GFR calc Af Amer 5 (*) >90 mL/min   Comment:            The eGFR has been calculated     using the CKD EPI equation.     This calculation has not been     validated in all clinical     situations.     eGFR's persistently     <90 mL/min signify     possible Chronic Kidney Disease.  CBC     Status: Abnormal   Collection Time    11/03/12  6:05 AM      Result Value Range   WBC 20.1 (*) 4.0 - 10.5 K/uL   RBC 3.74 (*) 4.22 - 5.81 MIL/uL   Hemoglobin 10.4 (*) 13.0 - 17.0 g/dL   HCT 30.8 (*) 39.0 - 52.0 %   MCV 82.4  78.0 - 100.0 fL   MCH 27.8  26.0 - 34.0 pg   MCHC 33.8  30.0 - 36.0 g/dL   RDW 17.7 (*) 11.5 - 15.5 %   Platelets 184  150 - 400 K/uL  GLUCOSE, CAPILLARY     Status: Abnormal   Collection Time    11/03/12 12:05 PM      Result Value Range   Glucose-Capillary 115 (*) 70 - 99 mg/dL  URINALYSIS, ROUTINE W REFLEX MICROSCOPIC     Status: Abnormal   Collection Time    11/03/12  2:02 PM      Result Value Range   Color, Urine AMBER (*) YELLOW   Comment: BIOCHEMICALS MAY BE AFFECTED BY COLOR   APPearance CLOUDY (*) CLEAR   Specific Gravity, Urine 1.015  1.005 - 1.030   pH 7.0  5.0 - 8.0   Glucose, UA NEGATIVE   NEGATIVE mg/dL   Hgb urine dipstick LARGE (*) NEGATIVE   Bilirubin Urine NEGATIVE  NEGATIVE   Ketones, ur NEGATIVE  NEGATIVE mg/dL   Protein, ur 100 (*) NEGATIVE mg/dL   Urobilinogen, UA 0.2  0.0 - 1.0 mg/dL   Nitrite POSITIVE (*) NEGATIVE   Leukocytes, UA LARGE (*) NEGATIVE  URINE CULTURE     Status: None   Collection Time    11/03/12  2:02 PM      Result Value Range   Specimen Description URINE, CLEAN CATCH     Special Requests NONE     Culture  Setup Time 11/03/2012 22:56     Colony Count PENDING     Culture Culture reincubated for better growth     Report Status PENDING    URINE MICROSCOPIC-ADD ON     Status: Abnormal   Collection Time    11/03/12  2:02 PM      Result Value Range   Squamous Epithelial / LPF RARE  RARE   WBC, UA TOO NUMEROUS TO COUNT  <3 WBC/hpf   RBC / HPF TOO NUMEROUS TO COUNT  <3 RBC/hpf   Bacteria, UA FEW (*) RARE   Urine-Other AMORPHOUS URATES/PHOSPHATES  GLUCOSE, CAPILLARY     Status: Abnormal   Collection Time    11/03/12  4:14 PM      Result Value Range   Glucose-Capillary 102 (*) 70 - 99 mg/dL   Comment 1 Documented in Chart     Comment 2 Notify RN    GLUCOSE, CAPILLARY     Status: None   Collection Time    11/03/12  7:46 PM      Result Value Range   Glucose-Capillary 95  70 - 99 mg/dL   Comment 1 Notify RN     Comment 2 Documented in Chart    GLUCOSE, CAPILLARY     Status: None   Collection Time    11/03/12  9:51 PM      Result Value Range   Glucose-Capillary 89  70 - 99 mg/dL   Comment 1 Notify RN     Comment 2 Documented in Chart    GLUCOSE, CAPILLARY     Status: None   Collection Time    11/03/12 11:25 PM      Result Value Range   Glucose-Capillary 95  70 - 99 mg/dL   Comment 1 Notify RN     Comment 2 Documented in Chart    GLUCOSE, CAPILLARY     Status: None   Collection Time    11/04/12  3:30 AM      Result Value Range   Glucose-Capillary 88  70 - 99 mg/dL   Comment 1 Notify RN     Comment 2 Documented in Chart     BASIC METABOLIC PANEL     Status: Abnormal   Collection Time    11/04/12  4:40 AM      Result Value Range   Sodium 153 (*) 135 - 145 mEq/L   Comment: DELTA CHECK NOTED   Potassium 4.6  3.5 - 5.1 mEq/L   Comment: DELTA CHECK NOTED     NO VISIBLE HEMOLYSIS   Chloride 114 (*) 96 - 112 mEq/L   CO2 20  19 - 32 mEq/L   Glucose, Bld 85  70 - 99 mg/dL   BUN 149 (*) 6 - 23 mg/dL   Creatinine, Ser 9.99 (*) 0.50 - 1.35 mg/dL   Calcium 9.3  8.4 - 10.5 mg/dL   GFR calc non Af Amer 5 (*) >90 mL/min   GFR calc Af Amer 6 (*) >90 mL/min   Comment:            The eGFR has been calculated     using the CKD EPI equation.     This calculation has not been     validated in all clinical     situations.     eGFR's persistently     <90 mL/min signify     possible Chronic Kidney Disease.  CBC     Status: Abnormal   Collection Time    11/04/12  4:40 AM      Result Value Range   WBC 15.7 (*) 4.0 - 10.5 K/uL   RBC 3.68 (*) 4.22 - 5.81 MIL/uL   Hemoglobin 10.3 (*) 13.0 - 17.0 g/dL   HCT 30.2 (*) 39.0 - 52.0 %   MCV 82.1  78.0 - 100.0 fL   MCH 28.0  26.0 - 34.0 pg   MCHC 34.1  30.0 - 36.0 g/dL   RDW 17.9 (*) 11.5 - 15.5 %   Platelets 221  150 - 400 K/uL  GLUCOSE, CAPILLARY     Status: None  Collection Time    11/04/12  8:13 AM      Result Value Range   Glucose-Capillary 78  70 - 99 mg/dL   Comment 1 Documented in Chart     Comment 2 Notify RN    GLUCOSE, CAPILLARY     Status: None   Collection Time    11/04/12  9:37 PM      Result Value Range   Glucose-Capillary 91  70 - 99 mg/dL   Comment 1 Notify RN     Comment 2 Documented in Chart    GLUCOSE, CAPILLARY     Status: None   Collection Time    11/05/12  4:15 AM      Result Value Range   Glucose-Capillary 93  70 - 99 mg/dL   Comment 1 Notify RN     Comment 2 Documented in Chart    RENAL FUNCTION PANEL     Status: Abnormal   Collection Time    11/05/12  5:10 AM      Result Value Range   Sodium 140  135 - 145 mEq/L   Comment: DELTA  CHECK NOTED   Potassium 3.6  3.5 - 5.1 mEq/L   Comment: DELTA CHECK NOTED   Chloride 101  96 - 112 mEq/L   CO2 19  19 - 32 mEq/L   Glucose, Bld 78  70 - 99 mg/dL   BUN 100 (*) 6 - 23 mg/dL   Creatinine, Ser 6.36 (*) 0.50 - 1.35 mg/dL   Comment: DELTA CHECK NOTED     REPEATED TO VERIFY   Calcium 8.4  8.4 - 10.5 mg/dL   Phosphorus 4.6  2.3 - 4.6 mg/dL   Albumin 1.9 (*) 3.5 - 5.2 g/dL   GFR calc non Af Amer 9 (*) >90 mL/min   GFR calc Af Amer 10 (*) >90 mL/min   Comment:            The eGFR has been calculated     using the CKD EPI equation.     This calculation has not been     validated in all clinical     situations.     eGFR's persistently     <90 mL/min signify     possible Chronic Kidney Disease.  CBC     Status: Abnormal   Collection Time    11/05/12  5:10 AM      Result Value Range   WBC 12.2 (*) 4.0 - 10.5 K/uL   RBC 3.53 (*) 4.22 - 5.81 MIL/uL   Hemoglobin 9.9 (*) 13.0 - 17.0 g/dL   HCT 28.7 (*) 39.0 - 52.0 %   MCV 81.3  78.0 - 100.0 fL   MCH 28.0  26.0 - 34.0 pg   MCHC 34.5  30.0 - 36.0 g/dL   RDW 17.4 (*) 11.5 - 15.5 %   Platelets 206  150 - 400 K/uL   Recent Results (from the past 240 hour(s))  CULTURE, BLOOD (ROUTINE X 2)     Status: None   Collection Time    11/03/12  1:45 AM      Result Value Range Status   Specimen Description BLOOD LEFT ARM   Final   Special Requests     Final   Value: BOTTLES DRAWN AEROBIC AND ANAEROBIC 6CC,PT ON ROCEPHIN   Culture  Setup Time 11/03/2012 10:47   Final   Culture     Final   Value:        BLOOD CULTURE  RECEIVED NO GROWTH TO DATE CULTURE WILL BE HELD FOR 5 DAYS BEFORE ISSUING A FINAL NEGATIVE REPORT   Report Status PENDING   Incomplete  CULTURE, BLOOD (ROUTINE X 2)     Status: None   Collection Time    11/03/12  1:50 AM      Result Value Range Status   Specimen Description BLOOD RIGHT ARM   Final   Special Requests BOTTLES DRAWN AEROBIC ONLY 3CC,PT ON ROCEPHIN   Final   Culture  Setup Time 11/03/2012 10:48   Final    Culture     Final   Value:        BLOOD CULTURE RECEIVED NO GROWTH TO DATE CULTURE WILL BE HELD FOR 5 DAYS BEFORE ISSUING A FINAL NEGATIVE REPORT   Report Status PENDING   Incomplete  MRSA PCR SCREENING     Status: None   Collection Time    11/03/12  2:29 AM      Result Value Range Status   MRSA by PCR NEGATIVE  NEGATIVE Final   Comment:            The GeneXpert MRSA Assay (FDA     approved for NASAL specimens     only), is one component of a     comprehensive MRSA colonization     surveillance program. It is not     intended to diagnose MRSA     infection nor to guide or     monitor treatment for     MRSA infections.  URINE CULTURE     Status: None   Collection Time    11/03/12  2:02 PM      Result Value Range Status   Specimen Description URINE, CLEAN CATCH   Final   Special Requests NONE   Final   Culture  Setup Time 11/03/2012 22:56   Final   Colony Count PENDING   Incomplete   Culture Culture reincubated for better growth   Final   Report Status PENDING   Incomplete   Creatinine:  Recent Labs  11/03/12 0150 11/03/12 0605 11/04/12 0440 11/05/12 0510  CREATININE 10.76* 11.39* 9.99* 6.36*    Xrays: PERC NEPHROSTOMY*L*,PERC NEPHROSTOMY*R*,IR ULTRASOUND GUIDANCE  TISSUE ABLATION  Fluoroscopy Time: 54 seconds  Comparison: None available.  Technique and findings: The procedure, risks (including but not  limited to bleeding, infection, organ damage), benefits, and  alternatives were explained to the family. Questions regarding the  procedure were encouraged and answered. The family understands and  consents to the procedure.Bilateralflank regions prepped with  Betadine, draped in usual sterile fashion, infiltrated locally with  1% lidocaine.The patient was receiving adequate antibiotic  coverage.  Intravenous Fentanyl and Versed were administered as conscious  sedation during continuous cardiorespiratory monitoring by the  radiology RN, with a total moderate  sedation time of 12 minutes.  Under real-time ultrasound guidance, a 21-gauge trocar needle was  advanced into a posterior right lower pole calyx. Ultrasound image  documentation was saved. Urine spontaneously returned through the  needle. Needle was exchanged over a guidewire for transitional  dilator. Contrast injection confirmed appropriate positioning.  Catheter was exchanged over a guidewire for a 10 French pigtail  catheter, formed centrally within the right renal collecting  system. Contrast injection confirms appropriate positioning and  patency.  In similar fashion, under real-time ultrasound guidance, a 21-gauge  trocar needle was advanced into a posterior left lower pole calyx.  Ultrasound image documentation was saved. Urine spontaneously  returned through the needle. Needle  was exchanged over a guidewire  for transitional dilator. Contrast injection confirmed appropriate  positioning. Catheter was exchanged over a guidewire for a 10  French pigtail catheter, formed centrally within the left renal  collecting system. Contrast injection confirms appropriate  positioning and patency.  Catheters secured externally with 0 Prolene suture and Statlock  devices and placed to external drain bags. No immediate  complication.  IMPRESSION  Technically successful bilateral percutaneous nephrostomy catheter  placement.  Original Report Authenticated By: D. Wallace Going, MD    Impression/Assessment:  Acute renal failure and chronic kidney disease, in pt with history of percutaneous nepholitholomy  surgery in [place] and now with calcified bilateral stents. These will require surgical removal, as has been discussed with the patient's Urologist in [place], Dr. Jacqlyn Larsen. When pt is stable, would advise transfer to [facility] for Dr. Jacqlyn Larsen to continue with his plan for surgical removal of the JJ stents.   Plan:   Advise transfer to [facility] for Dr. Bjorn Loser continued Urologic plan for surgical removal  of calcified JJ stents when pt has recovered from his current renal failure episode.   Carolan Clines I 11/05/2012, 6:51 PM

## 2012-11-06 ENCOUNTER — Inpatient Hospital Stay (HOSPITAL_COMMUNITY): Payer: No Typology Code available for payment source

## 2012-11-06 LAB — RENAL FUNCTION PANEL
Albumin: 2 g/dL — ABNORMAL LOW (ref 3.5–5.2)
BUN: 76 mg/dL — ABNORMAL HIGH (ref 6–23)
CO2: 24 meq/L (ref 19–32)
Calcium: 8.1 mg/dL — ABNORMAL LOW (ref 8.4–10.5)
Chloride: 98 meq/L (ref 96–112)
Creatinine, Ser: 4.7 mg/dL — ABNORMAL HIGH (ref 0.50–1.35)
GFR calc Af Amer: 14 mL/min — ABNORMAL LOW (ref >90)
GFR calc non Af Amer: 12 mL/min — ABNORMAL LOW (ref >90)
Glucose, Bld: 86 mg/dL (ref 70–99)
Phosphorus: 4.4 mg/dL (ref 2.3–4.6)
Potassium: 3.5 meq/L (ref 3.5–5.1)
Sodium: 136 meq/L (ref 135–145)

## 2012-11-06 LAB — CBC
MCH: 28 pg (ref 26.0–34.0)
MCHC: 34.3 g/dL (ref 30.0–36.0)
MCV: 81.5 fL (ref 78.0–100.0)
Platelets: 188 10*3/uL (ref 150–400)
RDW: 17.2 % — ABNORMAL HIGH (ref 11.5–15.5)
WBC: 10.8 10*3/uL — ABNORMAL HIGH (ref 4.0–10.5)

## 2012-11-06 LAB — URINE CULTURE

## 2012-11-06 MED ORDER — POLYETHYLENE GLYCOL 3350 17 G PO PACK
17.0000 g | PACK | Freq: Every day | ORAL | Status: DC
  Administered 2012-11-06 – 2012-11-11 (×5): 17 g via ORAL
  Filled 2012-11-06 (×6): qty 1

## 2012-11-06 MED ORDER — POTASSIUM CHLORIDE CRYS ER 20 MEQ PO TBCR
40.0000 meq | EXTENDED_RELEASE_TABLET | Freq: Four times a day (QID) | ORAL | Status: AC
  Administered 2012-11-06 (×2): 40 meq via ORAL
  Filled 2012-11-06 (×2): qty 2

## 2012-11-06 MED ORDER — SODIUM CHLORIDE 0.9 % IV SOLN
INTRAVENOUS | Status: DC
  Administered 2012-11-06 – 2012-11-09 (×7): via INTRAVENOUS

## 2012-11-06 MED ORDER — ALUM & MAG HYDROXIDE-SIMETH 200-200-20 MG/5ML PO SUSP
15.0000 mL | Freq: Four times a day (QID) | ORAL | Status: DC | PRN
  Filled 2012-11-06: qty 30

## 2012-11-06 MED ORDER — PANTOPRAZOLE SODIUM 40 MG PO TBEC
40.0000 mg | DELAYED_RELEASE_TABLET | Freq: Every day | ORAL | Status: DC
  Administered 2012-11-06 – 2012-11-11 (×6): 40 mg via ORAL
  Filled 2012-11-06 (×6): qty 1

## 2012-11-06 NOTE — Progress Notes (Signed)
Called to patient's room.  Upon entering, he complained of itching at the site of his left forearm PIV.  Upon assessment, it was noted that the site was slightly pink and swollen.  The Sodium Bicarb infusion was stopped.  [facility] was contacted.  RN was advised to elevate arm and apply warm heat packs to the area four times a day.  The PIV was removed.  The treatment was explained to and agreed to by the patient.  The left arm was elevated and a heat pack applied.  Will continue to monitor patient.  Avriel Kandel, Thea Gist

## 2012-11-06 NOTE — Progress Notes (Signed)
Subjective: Pt eating lunch, no new c/o  Objective: Vital signs in last 24 hours: Temp:  [97.4 F (36.3 C)-98.6 F (37 C)] 97.4 F (36.3 C) (06/07 0933) Pulse Rate:  [76-97] 97 (06/07 0933) Resp:  [17-18] 17 (06/07 0933) BP: (105-134)/(59-83) 105/59 mmHg (06/07 0933) SpO2:  [100 %] 100 % (06/07 0933) Weight:  [145 lb 11.6 oz (66.1 kg)] 145 lb 11.6 oz (66.1 kg) (06/06 2046) Last BM Date: 11/05/12  Intake/Output from previous day: 06/06 0701 - 06/07 0700 In: 4285 [P.O.:2040; I.V.:1150; IV Piggyback:200] Out: 2735 [Urine:2735] Intake/Output this shift: Total I/O In: 240 [P.O.:240] Out: 1000 [Urine:1000]  Left/right PCN's intact; output left- 900 cc's yellow urine; right- 100 cc's blood-tinged urine, creat trending down  Lab Results:   Recent Labs  11/05/12 0510 11/06/12 0432  WBC 12.2* 10.8*  HGB 9.9* 10.4*  HCT 28.7* 30.3*  PLT 206 188   BMET  Recent Labs  11/05/12 0510 11/06/12 0432  NA 140 136  K 3.6 3.5  CL 101 98  CO2 19 24  GLUCOSE 78 86  BUN 100* 76*  CREATININE 6.36* 4.70*  CALCIUM 8.4 8.1*   PT/INR No results found for this basename: LABPROT, INR,  in the last 72 hours ABG No results found for this basename: PHART, PCO2, PO2, HCO3,  in the last 72 hours  Studies/Results: No results found. Results for orders placed during the hospital encounter of 11/03/12  CULTURE, BLOOD (ROUTINE X 2)     Status: None   Collection Time    11/03/12  1:45 AM      Result Value Range Status   Specimen Description BLOOD LEFT ARM   Final   Special Requests     Final   Value: BOTTLES DRAWN AEROBIC AND ANAEROBIC 6CC,PT ON ROCEPHIN   Culture  Setup Time 11/03/2012 10:47   Final   Culture     Final   Value:        BLOOD CULTURE RECEIVED NO GROWTH TO DATE CULTURE WILL BE HELD FOR 5 DAYS BEFORE ISSUING A FINAL NEGATIVE REPORT   Report Status PENDING   Incomplete  CULTURE, BLOOD (ROUTINE X 2)     Status: None   Collection Time    11/03/12  1:50 AM      Result  Value Range Status   Specimen Description BLOOD RIGHT ARM   Final   Special Requests BOTTLES DRAWN AEROBIC ONLY 3CC,PT ON ROCEPHIN   Final   Culture  Setup Time 11/03/2012 10:48   Final   Culture     Final   Value:        BLOOD CULTURE RECEIVED NO GROWTH TO DATE CULTURE WILL BE HELD FOR 5 DAYS BEFORE ISSUING A FINAL NEGATIVE REPORT   Report Status PENDING   Incomplete  MRSA PCR SCREENING     Status: None   Collection Time    11/03/12  2:29 AM      Result Value Range Status   MRSA by PCR NEGATIVE  NEGATIVE Final   Comment:            The GeneXpert MRSA Assay (FDA     approved for NASAL specimens     only), is one component of a     comprehensive MRSA colonization     surveillance program. It is not     intended to diagnose MRSA     infection nor to guide or     monitor treatment for     MRSA infections.  URINE CULTURE     Status: None   Collection Time    11/03/12  2:02 PM      Result Value Range Status   Specimen Description URINE, CLEAN CATCH   Final   Special Requests NONE   Final   Culture  Setup Time 11/03/2012 22:56   Final   Colony Count >=100,000 COLONIES/ML   Final   Culture     Final   Value: ENTEROCOCCUS SPECIES     STAPHYLOCOCCUS SPECIES (COAGULASE NEGATIVE)     Note: RIFAMPIN AND GENTAMICIN SHOULD NOT BE USED AS SINGLE DRUGS FOR TREATMENT OF STAPH INFECTIONS.   Report Status PENDING   Incomplete    Anti-infectives: Anti-infectives   Start     Dose/Rate Route Frequency Ordered Stop   11/03/12 1400  piperacillin-tazobactam (ZOSYN) IVPB 2.25 g     2.25 g 100 mL/hr over 30 Minutes Intravenous 3 times per day 11/03/12 0557     11/03/12 0330  piperacillin-tazobactam (ZOSYN) IVPB 2.25 g     2.25 g 100 mL/hr over 30 Minutes Intravenous  Once 11/03/12 0314 11/03/12 0450      Assessment/Plan: s/p bilat PCN's 6/4;  CT today shows adequate positioning of tubes, right kidney atrophic; cont perc tubes as outlined by urology; check sensitivities for urine cx  LOS: 3  days    Arturo Sofranko,D [facility] 11/06/2012

## 2012-11-06 NOTE — Progress Notes (Signed)
ANTIBIOTIC CONSULT NOTE - FOLLOW UP  Pharmacy Consult for Zosyn Indication: Pyelonephritis  Allergies  Allergen Reactions  . Mellaril (Thioridazine) Other (See Comments)    Unknown   . Morphine And Related Itching  . Navane (Thiothixene) Other (See Comments)    GI Distress  . Thorazine (Chlorpromazine) Other (See Comments)    Unknown    Patient Measurements: Height: 6\' 1"  (185.4 cm) Weight: 145 lb 11.6 oz (66.1 kg) IBW/kg (Calculated) : 79.9  Vital Signs: Temp: 97.9 F (36.6 C) (06/07 0455) Temp src: Oral (06/07 0455) BP: 134/83 mmHg (06/07 0455) Pulse Rate: 76 (06/07 0455) Intake/Output from previous day: 06/06 0701 - 06/07 0700 In: 4285 [P.O.:2040; I.V.:1150; IV Piggyback:200] Out: 2735 [Urine:2735] Intake/Output from this shift: Total I/O In: -  Out: 650 [Urine:650]  Labs:  Recent Labs  11/04/12 0440 11/05/12 0510 11/06/12 0432  WBC 15.7* 12.2* 10.8*  HGB 10.3* 9.9* 10.4*  PLT 221 206 188  CREATININE 9.99* 6.36* 4.70*   Estimated Creatinine Clearance: 16 ml/min (by C-G formula based on Cr of 4.7). No results found for this basename: VANCOTROUGH, Corlis Leak, VANCORANDOM, GENTTROUGH, GENTPEAK, GENTRANDOM, TOBRATROUGH, TOBRAPEAK, TOBRARND, AMIKACINPEAK, AMIKACINTROU, AMIKACIN,  in the last 72 hours   Microbiology: Recent Results (from the past 720 hour(s))  CULTURE, BLOOD (ROUTINE X 2)     Status: None   Collection Time    11/03/12  1:45 AM      Result Value Range Status   Specimen Description BLOOD LEFT ARM   Final   Special Requests     Final   Value: BOTTLES DRAWN AEROBIC AND ANAEROBIC 6CC,PT ON ROCEPHIN   Culture  Setup Time 11/03/2012 10:47   Final   Culture     Final   Value:        BLOOD CULTURE RECEIVED NO GROWTH TO DATE CULTURE WILL BE HELD FOR 5 DAYS BEFORE ISSUING A FINAL NEGATIVE REPORT   Report Status PENDING   Incomplete  CULTURE, BLOOD (ROUTINE X 2)     Status: None   Collection Time    11/03/12  1:50 AM      Result Value Range Status    Specimen Description BLOOD RIGHT ARM   Final   Special Requests BOTTLES DRAWN AEROBIC ONLY 3CC,PT ON ROCEPHIN   Final   Culture  Setup Time 11/03/2012 10:48   Final   Culture     Final   Value:        BLOOD CULTURE RECEIVED NO GROWTH TO DATE CULTURE WILL BE HELD FOR 5 DAYS BEFORE ISSUING A FINAL NEGATIVE REPORT   Report Status PENDING   Incomplete  MRSA PCR SCREENING     Status: None   Collection Time    11/03/12  2:29 AM      Result Value Range Status   MRSA by PCR NEGATIVE  NEGATIVE Final   Comment:            The GeneXpert MRSA Assay (FDA     approved for NASAL specimens     only), is one component of a     comprehensive MRSA colonization     surveillance program. It is not     intended to diagnose MRSA     infection nor to guide or     monitor treatment for     MRSA infections.  URINE CULTURE     Status: None   Collection Time    11/03/12  2:02 PM      Result Value Range Status  Specimen Description URINE, CLEAN CATCH   Final   Special Requests NONE   Final   Culture  Setup Time 11/03/2012 22:56   Final   Colony Count >=100,000 COLONIES/ML   Final   Culture     Final   Value: ENTEROCOCCUS SPECIES     STAPHYLOCOCCUS SPECIES (COAGULASE NEGATIVE)     Note: RIFAMPIN AND GENTAMICIN SHOULD NOT BE USED AS SINGLE DRUGS FOR TREATMENT OF STAPH INFECTIONS.   Report Status PENDING   Incomplete    Anti-infectives   Start     Dose/Rate Route Frequency Ordered Stop   11/03/12 1400  piperacillin-tazobactam (ZOSYN) IVPB 2.25 g     2.25 g 100 mL/hr over 30 Minutes Intravenous 3 times per day 11/03/12 0557     11/03/12 0330  piperacillin-tazobactam (ZOSYN) IVPB 2.25 g     2.25 g 100 mL/hr over 30 Minutes Intravenous  Once 11/03/12 0314 11/03/12 0450      Assessment: 57 yo male admitted with AMS and ARF on Zosyn for enterococcus pyelonephritis. Patient is status post bilateral perc nephrostomy tubes. SCr has trended down from 11.39 to 4.7 today, with an estimated CrCl of 16  ml/min. WBC have trended down from 20.1 to 10.8. The patient has been afebrile.   6/4 blood culture: ngtd 6/4 urine culture: >100,000 enterococcus species; coag negative staph  Goal of Therapy:  Infection erradication  Clinical improvement  Plan:  Continue Zosyn 2.25g IV Q8hours  F/u renal function for dosage adjustments  F/u clinical status, culture data, LOT  Dicky Doe, PharmD Clinical Pharmacist Pager: [phone] Phone: [phone] 11/06/2012 7:59 AM

## 2012-11-06 NOTE — Progress Notes (Signed)
[facility] Progress Note   ISHMEAL LENTON [id number] DOB: 1955/08/01 DOA: 11/03/2012 PCP: No primary provider on file.  HPI/Subjective: He is awake, alert and oriented x3, no confusion.  Brief narrative: 57 year old male patient with history of schizophrenia and hypothyroidism. Was found to be increasingly confused at his primary residence/? Group home. He was also covered in feces. He was initially taken to the emergency department at [facility]. At that time laboratory data revealed markedly elevated serum creatinine of 10 with his baseline being 2.8 within the past 2 months. CT abdomen and pelvis revealed bilateral hydronephrosis with obstruction. Patient has an apparent history of hydronephrosis requiring stent placement at [facility]. Apparently the patient stands became calcified and were unable to be removed there were also obstructed based on most recent diagnostic imaging. He had been evaluated by a urologist at [facility] who discussed the case with the urologist at [place] Dr. Era Bumpers. The patient was subsequently transferred to [facility] for placement of percutaneous nephrostomy tubes. In addition to the above abnormalities urinalysis was consistent with probable UTI and followup CT of the abdomen and pelvis ER revealed left-sided perinephric stranding. In addition laboratory data from the previous facility revealed hyperkalemia and metabolic acidosis an EKG was concerning for T-wave peaking consistent with hyperkalemia so the patient was given IV calcium gluconate.  Assessment/Plan:  Acute Renal Failure due to obstructive uropathy -Secondary to bilateral hydronephrosis (per CT scan from [place]).  -He is now s/p bilateral percutaneous nephrostomy tubes by IR.  -Cr ~11 at admit and has trended down after perc nephro tubes placed -May need eventual renal eval but currently making excellent UOP. -Last night has  2600 cc from left tube and only 60 cc from the right tube. -Spoke with Dr. Laurence Ferrari, recommended noncontrasted CT scan to check for the tubes/residual of hydronephrosis.   Bilateral Hydronephrosis   -2/2 stones/? calcified ureteral stents ?.  -s/p percutaneous nephrostomy.  -Urology consulted at [facility] by EDP- have re contacted office today/Tannenbaum on call -will definitely require urology FU after dc to internalize stents vs other procedure especially given presentation and prior history -Dr. Era Bumpers of the urology recommended return to [facility] to remove the calcified ureteral stents.  Acute Encephalopathy  -Presumed uremic -awake and less confused today - seems at baseline  Hyperkalemia/hypernatremia/metabolic acidosis -2/2 ARF-K+ stable and now normalized (received Kayexalate this admit) -Na+ increased  (6/5) so decreased Bicarb to 1 amp and changed to D5W for base fluid- Na+ now normal -EKG without acute changes.  -follow labs  Sepsis 2/2 Pyelonephritis  -Urine culture showed enterococcus and coagulase negative staph, patient is on Zosyn. -I will not change antibiotics to vancomycin as patient has no fever and leukocytes are improving  Leukocytosis  -2/2 pyelonephritis.  -trending downward on anbx's    DVT prophylaxis: SCDs Code Status: Full Family Communication:  Disposition Plan: Remain as inpatient Isolation: None Nutritional Status: Suspected acute protein calorie malnutrition related to acute uremia and altered mentation  Consultants: Interventional radiology Urology  Procedures: Insertion of bilateral percutaneous nephrostomy tubes interventional radiology 6/4  Antibiotics: Zosyn 6/3 >>>     Objective: Blood pressure 105/59, pulse 97, temperature 97.4 F (36.3 C), temperature source Oral, resp. rate 17, height 6\' 1"  (1.854 m), weight 66.1 kg (145 lb 11.6 oz), SpO2 100.00%.  Intake/Output Summary (Last 24 hours) at 11/06/12 1125 Last data filed at  11/06/12 1104  Gross per 24 hour  Intake   4355 ml  Output   2785 ml  Net   1570 ml     Exam: General: No acute respiratory distress Lungs: Clear to auscultation bilaterally without wheezes or crackles, RA Cardiovascular: Regular rate and rhythm without murmur gallop or rub normal S1 and S2, no peripheral edema or JVD Abdomen: Nontender, nondistended, soft, bowel sounds positive, no rebound, no ascites, no appreciable mass Genitourinary: Bilateral percutaneous nephrostomy tubes in place with moderate amount of blood-tinged urine without purulence Musculoskeletal: No significant cyanosis, clubbing of bilateral lower extremities Neurological: Alert and oriented x name and place, moves all extremities x 4 without focal neurological deficits, CN 2-12 intact  Scheduled Meds: Scheduled Meds: . feeding supplement  1 Container Oral TID BM  . piperacillin-tazobactam (ZOSYN)  IV  2.25 g Intravenous Q8H  . sodium chloride  3 mL Intravenous Q12H   Continuous Infusions: . albuterol 10 mg/hr (11/03/12 0450)  .  sodium bicarbonate  infusion 1000 mL 100 mL/hr at 11/05/12 1958    Data Reviewed: Basic Metabolic Panel:  Recent Labs Lab 11/03/12 0150 11/03/12 0605 11/04/12 0440 11/05/12 0510 11/06/12 0432  NA 144 144 153* 140 136  K 6.6* 6.0* 4.6 3.6 3.5  CL 110 111 114* 101 98  CO2 14* 13* 20 19 24   GLUCOSE 117* 97 85 78 86  BUN 151* 148* 149* 100* 76*  CREATININE 10.76* 11.39* 9.99* 6.36* 4.70*  CALCIUM 9.6 9.6 9.3 8.4 8.1*  PHOS  --   --   --  4.6 4.4   Liver Function Tests:  Recent Labs Lab 11/03/12 0150 11/05/12 0510 11/06/12 0432  AST 24  --   --   ALT 25  --   --   ALKPHOS 95  --   --   BILITOT 0.2*  --   --   PROT 6.9  --   --   ALBUMIN 2.7* 1.9* 2.0*   No results found for this basename: LIPASE, AMYLASE,  in the last 168 hours No results found for this basename: AMMONIA,  in the last 168 hours CBC:  Recent Labs Lab 11/03/12 0150 11/03/12 0605 11/04/12 0440  11/05/12 0510 11/06/12 0432  WBC 22.4* 20.1* 15.7* 12.2* 10.8*  NEUTROABS 20.2*  --   --   --   --   HGB 9.7* 10.4* 10.3* 9.9* 10.4*  HCT 28.8* 30.8* 30.2* 28.7* 30.3*  MCV 83.2 82.4 82.1 81.3 81.5  PLT 178 184 221 206 188   Cardiac Enzymes: No results found for this basename: CKTOTAL, CKMB, CKMBINDEX, TROPONINI,  in the last 168 hours BNP (last 3 results) No results found for this basename: PROBNP,  in the last 8760 hours CBG:  Recent Labs Lab 11/03/12 2325 11/04/12 0330 11/04/12 0813 11/04/12 2137 11/05/12 0415  GLUCAP 95 88 78 91 93    Recent Results (from the past 240 hour(s))  CULTURE, BLOOD (ROUTINE X 2)     Status: None   Collection Time    11/03/12  1:45 AM      Result Value Range Status   Specimen Description BLOOD LEFT ARM   Final   Special Requests     Final   Value: BOTTLES DRAWN AEROBIC AND ANAEROBIC 6CC,PT ON ROCEPHIN   Culture  Setup Time 11/03/2012 10:47   Final   Culture     Final   Value:        BLOOD CULTURE RECEIVED NO GROWTH TO DATE CULTURE WILL BE HELD FOR 5 DAYS BEFORE ISSUING A FINAL NEGATIVE REPORT   Report  Status PENDING   Incomplete  CULTURE, BLOOD (ROUTINE X 2)     Status: None   Collection Time    11/03/12  1:50 AM      Result Value Range Status   Specimen Description BLOOD RIGHT ARM   Final   Special Requests BOTTLES DRAWN AEROBIC ONLY 3CC,PT ON ROCEPHIN   Final   Culture  Setup Time 11/03/2012 10:48   Final   Culture     Final   Value:        BLOOD CULTURE RECEIVED NO GROWTH TO DATE CULTURE WILL BE HELD FOR 5 DAYS BEFORE ISSUING A FINAL NEGATIVE REPORT   Report Status PENDING   Incomplete  MRSA PCR SCREENING     Status: None   Collection Time    11/03/12  2:29 AM      Result Value Range Status   MRSA by PCR NEGATIVE  NEGATIVE Final   Comment:            The GeneXpert MRSA Assay (FDA     approved for NASAL specimens     only), is one component of a     comprehensive MRSA colonization     surveillance program. It is not      intended to diagnose MRSA     infection nor to guide or     monitor treatment for     MRSA infections.  URINE CULTURE     Status: None   Collection Time    11/03/12  2:02 PM      Result Value Range Status   Specimen Description URINE, CLEAN CATCH   Final   Special Requests NONE   Final   Culture  Setup Time 11/03/2012 22:56   Final   Colony Count >=100,000 COLONIES/ML   Final   Culture     Final   Value: ENTEROCOCCUS SPECIES     STAPHYLOCOCCUS SPECIES (COAGULASE NEGATIVE)     Note: RIFAMPIN AND GENTAMICIN SHOULD NOT BE USED AS SINGLE DRUGS FOR TREATMENT OF STAPH INFECTIONS.   Report Status PENDING   Incomplete     Studies:  Recent x-ray studies have been reviewed in detail by the Attending Physician  Scheduled Meds:  Reviewed in detail by the Attending Physician   Erin Hearing, [place] [facility] Office  [phone] Pager [phone]  **If unable to reach the above provider after paging please contact the [place] @ [phone]  On-Call/Text Page:      [email or web address]      password [email or web address]  If 7PM-7AM, please contact night-coverage [email or web address] Password [facility] 11/06/2012, 11:25 AM   LOS: 3 days   I have examined the patient, reviewed the chart and modified the above note which I agree with.   [facility] A,MD CB:[id number] 11/06/2012, 11:25 AM

## 2012-11-06 NOTE — Consult Note (Signed)
Subjective:  Pt non verbal. Overnight RN reports no change from last night. Poor urine output from R nephrostomy and most urine from Left side.   Objective: Vital signs in last 24 hours: Temp:  [97.9 F (36.6 C)-98.6 F (37 C)] 97.9 F (36.6 C) (06/07 0455) Pulse Rate:  [76-90] 76 (06/07 0455) Resp:  [15-18] 18 (06/07 0455) BP: (113-134)/(77-83) 134/83 mmHg (06/07 0455) SpO2:  [100 %] 100 % (06/07 0455) Weight:  [66.1 kg (145 lb 11.6 oz)] 66.1 kg (145 lb 11.6 oz) (06/06 2046)A  Intake/Output from previous day: 06/06 0701 - 06/07 0700 In: [id number] [P.O.:2040; I.V.:1150; IV Piggyback:200] Out: 2735 [Urine:2735] Intake/Output this shift:    Past Medical History  Diagnosis Date  . Mental disorder   . Hypothyroidism     Physical Exam:  Lungs - Normal respiratory effort, chest expands symmetrically.  Abdomen - Soft, non-tender & non-distended. Bilateral perc tubes in place Lab Results:  Recent Labs  11/04/12 0440 11/05/12 0510 11/06/12 0432  WBC 15.7* 12.2* 10.8*  HGB 10.3* 9.9* 10.4*  HCT 30.2* 28.7* 30.3*   BMET  Recent Labs  11/05/12 0510 11/06/12 0432  NA 140 136  K 3.6 3.5  CL 101 98  CO2 19 24  GLUCOSE 78 86  BUN 100* 76*  CREATININE 6.36* 4.70*  CALCIUM 8.4 8.1*   No results found for this basename: LABURIN,  in the last 72 hours Results for orders placed during the hospital encounter of 11/03/12  CULTURE, BLOOD (ROUTINE X 2)     Status: None   Collection Time    11/03/12  1:45 AM      Result Value Range Status   Specimen Description BLOOD LEFT ARM   Final   Special Requests     Final   Value: BOTTLES DRAWN AEROBIC AND ANAEROBIC 6CC,PT ON ROCEPHIN   Culture  Setup Time 11/03/2012 10:47   Final   Culture     Final   Value:        BLOOD CULTURE RECEIVED NO GROWTH TO DATE CULTURE WILL BE HELD FOR 5 DAYS BEFORE ISSUING A FINAL NEGATIVE REPORT   Report Status PENDING   Incomplete  CULTURE, BLOOD (ROUTINE X 2)     Status: None   Collection Time     11/03/12  1:50 AM      Result Value Range Status   Specimen Description BLOOD RIGHT ARM   Final   Special Requests BOTTLES DRAWN AEROBIC ONLY 3CC,PT ON ROCEPHIN   Final   Culture  Setup Time 11/03/2012 10:48   Final   Culture     Final   Value:        BLOOD CULTURE RECEIVED NO GROWTH TO DATE CULTURE WILL BE HELD FOR 5 DAYS BEFORE ISSUING A FINAL NEGATIVE REPORT   Report Status PENDING   Incomplete  MRSA PCR SCREENING     Status: None   Collection Time    11/03/12  2:29 AM      Result Value Range Status   MRSA by PCR NEGATIVE  NEGATIVE Final   Comment:            The GeneXpert MRSA Assay (FDA     approved for NASAL specimens     only), is one component of a     comprehensive MRSA colonization     surveillance program. It is not     intended to diagnose MRSA     infection nor to guide or     monitor  treatment for     MRSA infections.  URINE CULTURE     Status: None   Collection Time    11/03/12  2:02 PM      Result Value Range Status   Specimen Description URINE, CLEAN CATCH   Final   Special Requests NONE   Final   Culture  Setup Time 11/03/2012 22:56   Final   Colony Count >=100,000 COLONIES/ML   Final   Culture     Final   Value: ENTEROCOCCUS SPECIES     STAPHYLOCOCCUS SPECIES (COAGULASE NEGATIVE)     Note: RIFAMPIN AND GENTAMICIN SHOULD NOT BE USED AS SINGLE DRUGS FOR TREATMENT OF STAPH INFECTIONS.   Report Status PENDING   Incomplete    Studies/Results:   Assessment: Enterococcus UTI, wit bilateral calcified ureteral stents and acute on chronic arenal failure. Bilateral perc nephrostomy tubes working well. Creatinine continues to improve.   Plan: Advise: continue medical plan, and return to [facility] for Urologic procedures to remove calcified ureteral stents. Continue Rx for Enterococcus UTI. Continue percutaneous catheters. Recommend split creatinine/gfr from each perc tube to assess functionof each kidney-when creatinine has fallen to "baseline".   Alan Small  I 11/06/2012, 7:49 AM

## 2012-11-07 DIAGNOSIS — E039 Hypothyroidism, unspecified: Secondary | ICD-10-CM

## 2012-11-07 LAB — URINALYSIS, ROUTINE W REFLEX MICROSCOPIC
Glucose, UA: NEGATIVE mg/dL
Specific Gravity, Urine: 1.016 (ref 1.005–1.030)
Urobilinogen, UA: 0.2 mg/dL (ref 0.0–1.0)

## 2012-11-07 LAB — CBC
MCH: 27.5 pg (ref 26.0–34.0)
MCHC: 33.2 g/dL (ref 30.0–36.0)
MCV: 82.9 fL (ref 78.0–100.0)
Platelets: 222 10*3/uL (ref 150–400)

## 2012-11-07 LAB — RENAL FUNCTION PANEL
Albumin: 2.2 g/dL — ABNORMAL LOW (ref 3.5–5.2)
Calcium: 8.4 mg/dL (ref 8.4–10.5)
Creatinine, Ser: 4.13 mg/dL — ABNORMAL HIGH (ref 0.50–1.35)
GFR calc non Af Amer: 15 mL/min — ABNORMAL LOW (ref >90)
Phosphorus: 4.1 mg/dL (ref 2.3–4.6)

## 2012-11-07 LAB — CULTURE, BLOOD (SINGLE)

## 2012-11-07 LAB — URINE MICROSCOPIC-ADD ON

## 2012-11-07 NOTE — Progress Notes (Signed)
[facility] Progress Note   Alan Small [id number] DOB: 12-14-1955 DOA: 11/03/2012 PCP: No primary provider on file.  HPI/Subjective: Awake eating his meal this morning, no complaints.  Brief narrative: 57 year old male patient with history of schizophrenia and hypothyroidism. Was found to be increasingly confused at his primary residence/? Group home. He was also covered in feces. He was initially taken to the emergency department at [facility]. At that time laboratory data revealed markedly elevated serum creatinine of 10 with his baseline being 2.8 within the past 2 months. CT abdomen and pelvis revealed bilateral hydronephrosis with obstruction. Patient has an apparent history of hydronephrosis requiring stent placement at [facility]. Apparently the patient stands became calcified and were unable to be removed there were also obstructed based on most recent diagnostic imaging. He had been evaluated by a urologist at [facility] who discussed the case with the urologist at [place] Dr. Era Bumpers. The patient was subsequently transferred to [facility] for placement of percutaneous nephrostomy tubes. In addition to the above abnormalities urinalysis was consistent with probable UTI and followup CT of the abdomen and pelvis ER revealed left-sided perinephric stranding. In addition laboratory data from the previous facility revealed hyperkalemia and metabolic acidosis an EKG was concerning for T-wave peaking consistent with hyperkalemia so the patient was given IV calcium gluconate.  Assessment/Plan:  Acute Renal Failure due to obstructive uropathy -Secondary to bilateral hydronephrosis (per CT scan from [place]).  -He is now s/p bilateral percutaneous nephrostomy tubes by IR.  -Cr ~11 at admit and has trended down after perc nephro tubes placed -May need eventual renal eval but currently making excellent UOP. -The left side  PCN tube is draining significantly more than the right side PCN tube -CT of abdomen and pelvis showed resolution of right-sided hydronephrosis, there is still left-sided hydronephrosis. -Continue antibiotics, check BMP in a.m.  Bilateral Hydronephrosis   -2/2 stones/? calcified ureteral stents ?.  -s/p percutaneous nephrostomy.  -Urology consulted at [facility] by EDP- have re contacted office today/Tannenbaum on call -will definitely require urology FU after dc to internalize stents vs other procedure especially given presentation and prior history -Dr. Era Bumpers of the urology recommended return to [facility] to remove the calcified ureteral stents.  Acute Encephalopathy  -Presumed uremic -awake and less confused today - seems at baseline  Hyperkalemia/hypernatremia/metabolic acidosis -2/2 ARF-K+ stable and now normalized (received Kayexalate this admit) -Na+ increased  (6/5) so decreased Bicarb to 1 amp and changed to D5W for base fluid- Na+ now normal -EKG without acute changes.  -follow labs  Sepsis 2/2 Pyelonephritis  -Urine culture showed enterococcus and coagulase negative staph, patient is on Zosyn. -Methicillin sensitive coagulase-negative staph, amoxicillin sensitive enterococcus. -Likely he can be discharged on oral beta-lactam antibiotic like amoxicillin or Ceftin  Leukocytosis  -2/2 pyelonephritis.  -trending downward on anbx's    DVT prophylaxis: SCDs Code Status: Full Family Communication:  Disposition Plan: Remain as inpatient Isolation: None Nutritional Status: Suspected acute protein calorie malnutrition related to acute uremia and altered mentation  Consultants: Interventional radiology Urology  Procedures: Insertion of bilateral percutaneous nephrostomy tubes interventional radiology 6/4  Antibiotics: Zosyn 6/3 >>>     Objective: Blood pressure 98/65, pulse 86, temperature 98.2 F (36.8 C), temperature source Oral, resp. rate 18, height 6\' 1"  (1.854  m), weight 68.448 kg (150 lb 14.4 oz), SpO2 100.00%.  Intake/Output Summary (Last 24 hours) at 11/07/12 1128 Last data filed at 11/07/12 0720  Gross per 24 hour  Intake   1695 ml  Output   3600 ml  Net  -1905 ml     Exam: General: No acute respiratory distress Lungs: Clear to auscultation bilaterally without wheezes or crackles, RA Cardiovascular: Regular rate and rhythm without murmur gallop or rub normal S1 and S2, no peripheral edema or JVD Abdomen: Nontender, nondistended, soft, bowel sounds positive, no rebound, no ascites, no appreciable mass Genitourinary: Bilateral percutaneous nephrostomy tubes in place with moderate amount of blood-tinged urine without purulence Musculoskeletal: No significant cyanosis, clubbing of bilateral lower extremities Neurological: Alert and oriented x name and place, moves all extremities x 4 without focal neurological deficits, CN 2-12 intact  Scheduled Meds: Scheduled Meds: . feeding supplement  1 Container Oral TID BM  . pantoprazole  40 mg Oral Daily  . piperacillin-tazobactam (ZOSYN)  IV  2.25 g Intravenous Q8H  . polyethylene glycol  17 g Oral Daily   Continuous Infusions: . sodium chloride 100 mL/hr at 11/06/12 2027  . albuterol 10 mg/hr (11/03/12 0450)    Data Reviewed: Basic Metabolic Panel:  Recent Labs Lab 11/03/12 0605 11/04/12 0440 11/05/12 0510 11/06/12 0432 11/07/12 0405  NA 144 153* 140 136 138  K 6.0* 4.6 3.6 3.5 4.4  CL 111 114* 101 98 106  CO2 13* 20 19 24 21   GLUCOSE 97 85 78 86 87  BUN 148* 149* 100* 76* 64*  CREATININE 11.39* 9.99* 6.36* 4.70* 4.13*  CALCIUM 9.6 9.3 8.4 8.1* 8.4  PHOS  --   --  4.6 4.4 4.1   Liver Function Tests:  Recent Labs Lab 11/03/12 0150 11/05/12 0510 11/06/12 0432 11/07/12 0405  AST 24  --   --   --   ALT 25  --   --   --   ALKPHOS 95  --   --   --   BILITOT 0.2*  --   --   --   PROT 6.9  --   --   --   ALBUMIN 2.7* 1.9* 2.0* 2.2*   No results found for this basename:  LIPASE, AMYLASE,  in the last 168 hours No results found for this basename: AMMONIA,  in the last 168 hours CBC:  Recent Labs Lab 11/03/12 0150 11/03/12 0605 11/04/12 0440 11/05/12 0510 11/06/12 0432 11/07/12 0405  WBC 22.4* 20.1* 15.7* 12.2* 10.8* 11.4*  NEUTROABS 20.2*  --   --   --   --   --   HGB 9.7* 10.4* 10.3* 9.9* 10.4* 10.3*  HCT 28.8* 30.8* 30.2* 28.7* 30.3* 31.0*  MCV 83.2 82.4 82.1 81.3 81.5 82.9  PLT 178 184 221 206 188 222   Cardiac Enzymes: No results found for this basename: CKTOTAL, CKMB, CKMBINDEX, TROPONINI,  in the last 168 hours BNP (last 3 results) No results found for this basename: PROBNP,  in the last 8760 hours CBG:  Recent Labs Lab 11/03/12 2325 11/04/12 0330 11/04/12 0813 11/04/12 2137 11/05/12 0415  GLUCAP 95 88 78 91 93    Recent Results (from the past 240 hour(s))  CULTURE, BLOOD (ROUTINE X 2)     Status: None   Collection Time    11/03/12  1:45 AM      Result Value Range Status   Specimen Description BLOOD LEFT ARM   Final   Special Requests     Final   Value: BOTTLES DRAWN AEROBIC AND ANAEROBIC 6CC,PT ON ROCEPHIN   Culture  Setup Time 11/03/2012 10:47   Final   Culture  Final   Value:        BLOOD CULTURE RECEIVED NO GROWTH TO DATE CULTURE WILL BE HELD FOR 5 DAYS BEFORE ISSUING A FINAL NEGATIVE REPORT   Report Status PENDING   Incomplete  CULTURE, BLOOD (ROUTINE X 2)     Status: None   Collection Time    11/03/12  1:50 AM      Result Value Range Status   Specimen Description BLOOD RIGHT ARM   Final   Special Requests BOTTLES DRAWN AEROBIC ONLY 3CC,PT ON ROCEPHIN   Final   Culture  Setup Time 11/03/2012 10:48   Final   Culture     Final   Value:        BLOOD CULTURE RECEIVED NO GROWTH TO DATE CULTURE WILL BE HELD FOR 5 DAYS BEFORE ISSUING A FINAL NEGATIVE REPORT   Report Status PENDING   Incomplete  MRSA PCR SCREENING     Status: None   Collection Time    11/03/12  2:29 AM      Result Value Range Status   MRSA by PCR  NEGATIVE  NEGATIVE Final   Comment:            The GeneXpert MRSA Assay (FDA     approved for NASAL specimens     only), is one component of a     comprehensive MRSA colonization     surveillance program. It is not     intended to diagnose MRSA     infection nor to guide or     monitor treatment for     MRSA infections.  URINE CULTURE     Status: None   Collection Time    11/03/12  2:02 PM      Result Value Range Status   Specimen Description URINE, CLEAN CATCH   Final   Special Requests NONE   Final   Culture  Setup Time 11/03/2012 22:56   Final   Colony Count >=100,000 COLONIES/ML   Final   Culture     Final   Value: ENTEROCOCCUS SPECIES     STAPHYLOCOCCUS SPECIES (COAGULASE NEGATIVE)     Note: RIFAMPIN AND GENTAMICIN SHOULD NOT BE USED AS SINGLE DRUGS FOR TREATMENT OF STAPH INFECTIONS.   Report Status 11/06/2012 FINAL   Final   Organism ID, Bacteria ENTEROCOCCUS SPECIES   Final   Organism ID, Bacteria STAPHYLOCOCCUS SPECIES (COAGULASE NEGATIVE)   Final     Studies:  Recent x-ray studies have been reviewed in detail by the Attending Physician  Scheduled Meds:  Reviewed in detail by the Attending Physician   Erin Hearing, ANP [facility] Office  [phone] Pager [phone]  **If unable to reach the above provider after paging please contact the Flow Manager @ [phone]  On-Call/Text Page:      [email or web address]      password [email or web address]  If 7PM-7AM, please contact night-coverage [email or web address] Password [email or web address] 11/07/2012, 11:28 AM   LOS: 4 days   I have examined the patient, reviewed the chart and modified the above note which I agree with.   [facility] A,MD CB:[id number] 11/07/2012, 11:28 AM

## 2012-11-07 NOTE — Progress Notes (Signed)
Subjective:   Brief narrative:  57 year old male patient with history of schizophrenia and hypothyroidism. Was found to be increasingly confused at his primary residence/? Group home. He was also covered in feces. He was initially taken to the emergency department at [facility]. At that time laboratory data revealed markedly elevated serum creatinine of 10 with his baseline being 2.8 within the past 2 months. CT abdomen and pelvis revealed bilateral hydronephrosis with obstruction. Patient has an apparent history of hydronephrosis requiring stent placement at [facility]. Apparently the patient stands became calcified and were unable to be removed there were also obstructed based on most recent diagnostic imaging. He had been evaluated by a urologist at [facility] who discussed the case with the urologist at [place] Dr. Era Bumpers. The patient was subsequently transferred to [facility] for placement of percutaneous nephrostomy tubes. In addition to the above abnormalities urinalysis was consistent with probable UTI and followup CT of the abdomen and pelvis ER revealed left-sided perinephric stranding. In addition laboratory data from the previous facility revealed hyperkalemia and metabolic acidosis an EKG was concerning for T-wave peaking consistent with hyperkalemia so the patient was given IV calcium gluconate.   Pt having minimal output from right perc. Needs differential Cr/gfr from both nephrostomies. Cr: 4.14 today    Objective: Vital signs in last 24 hours: Temp:  [97.8 F (36.6 C)-98.6 F (37 C)] 97.8 F (36.6 C) (06/08 0452) Pulse Rate:  [63-98] 63 (06/08 0452) Resp:  [16-18] 18 (06/08 0452) BP: (104-119)/(63-82) 119/82 mmHg (06/08 0452) SpO2:  [99 %-100 %] 100 % (06/08 0452) Weight:  [68.448 kg (150 lb 14.4 oz)] 68.448 kg (150 lb 14.4 oz) (06/07 2119)A  Intake/Output from previous day: 06/07 0701 - 06/08 0700 In: 1935  [P.O.:720; I.V.:1055; IV Piggyback:150] Out: 4200 [Urine:4200] Intake/Output this shift: Total I/O In: -  Out: 400 [Urine:400]  Past Medical History  Diagnosis Date  . Mental disorder   . Hypothyroidism     Physical Exam:  Lungs - Normal respiratory effort, chest expands symmetrically.  Abdomen - Soft, non-tender & non-distended.  Lab Results:  Recent Labs  11/05/12 0510 11/06/12 0432 11/07/12 0405  WBC 12.2* 10.8* 11.4*  HGB 9.9* 10.4* 10.3*  HCT 28.7* 30.3* 31.0*   BMET  Recent Labs  11/06/12 0432 11/07/12 0405  NA 136 138  K 3.5 4.4  CL 98 106  CO2 24 21  GLUCOSE 86 87  BUN 76* 64*  CREATININE 4.70* 4.13*  CALCIUM 8.1* 8.4   No results found for this basename: LABURIN,  in the last 72 hours Results for orders placed during the hospital encounter of 11/03/12  CULTURE, BLOOD (ROUTINE X 2)     Status: None   Collection Time    11/03/12  1:45 AM      Result Value Range Status   Specimen Description BLOOD LEFT ARM   Final   Special Requests     Final   Value: BOTTLES DRAWN AEROBIC AND ANAEROBIC 6CC,PT ON ROCEPHIN   Culture  Setup Time 11/03/2012 10:47   Final   Culture     Final   Value:        BLOOD CULTURE RECEIVED NO GROWTH TO DATE CULTURE WILL BE HELD FOR 5 DAYS BEFORE ISSUING A FINAL NEGATIVE REPORT   Report Status PENDING   Incomplete  CULTURE, BLOOD (ROUTINE X 2)     Status: None   Collection Time    11/03/12  1:50 AM  Result Value Range Status   Specimen Description BLOOD RIGHT ARM   Final   Special Requests BOTTLES DRAWN AEROBIC ONLY 3CC,PT ON ROCEPHIN   Final   Culture  Setup Time 11/03/2012 10:48   Final   Culture     Final   Value:        BLOOD CULTURE RECEIVED NO GROWTH TO DATE CULTURE WILL BE HELD FOR 5 DAYS BEFORE ISSUING A FINAL NEGATIVE REPORT   Report Status PENDING   Incomplete  MRSA PCR SCREENING     Status: None   Collection Time    11/03/12  2:29 AM      Result Value Range Status   MRSA by PCR NEGATIVE  NEGATIVE Final    Comment:            The GeneXpert MRSA Assay (FDA     approved for NASAL specimens     only), is one component of a     comprehensive MRSA colonization     surveillance program. It is not     intended to diagnose MRSA     infection nor to guide or     monitor treatment for     MRSA infections.  URINE CULTURE     Status: None   Collection Time    11/03/12  2:02 PM      Result Value Range Status   Specimen Description URINE, CLEAN CATCH   Final   Special Requests NONE   Final   Culture  Setup Time 11/03/2012 22:56   Final   Colony Count >=100,000 COLONIES/ML   Final   Culture     Final   Value: ENTEROCOCCUS SPECIES     STAPHYLOCOCCUS SPECIES (COAGULASE NEGATIVE)     Note: RIFAMPIN AND GENTAMICIN SHOULD NOT BE USED AS SINGLE DRUGS FOR TREATMENT OF STAPH INFECTIONS.   Report Status 11/06/2012 FINAL   Final   Organism ID, Bacteria ENTEROCOCCUS SPECIES   Final   Organism ID, Bacteria STAPHYLOCOCCUS SPECIES (COAGULASE NEGATIVE)   Final    Studies/Results: Lung Bases: A small left pleural effusion. Dependent atelectasis  in the lower lobes of the lungs bilaterally.  Abdomen/Pelvis: Bilateral percutaneous nephrostomy tubes are in  position. The right tube appears properly located within the right  renal pelvis. There is some high-density material within the right  renal pelvis measuring approximately 2.2 cm compatible with a large  calculus. A small nonobstructive calculi are also noted in the  lower pole of the right kidney, largest of which is 10 mm in  diameter. There is a small locule of gas in the right renal  collecting system, iatrogenic. No right hydronephrosis. Small  right-sided perinephric stranding. The right kidney is mildly  atrophic. The left nephrostomy tube extends into the lower pole  collecting system of the left kidney and is slightly peripherally  located. Tiny nonobstructive calculus measuring only 3 mm in the  interpolar collecting system of the left kidney.  There is severe  left-sided hydroureteronephrosis extensive left-sided perinephric  stranding. Coronal images suggest probable forniceal rupture in  the upper pole (image 51 of series 5). The hydroureteronephrosis  abruptly terminates in the mid left ureter where there is a heavily  calcified retained left ureteral stent which has migrated into the  mid and distal left ureter, extending into the pelvis. The  ureteral portion of the stent is surrounded by a large amount of  high attenuation material, likely to be completely encrusted with  calculus.  Numerous low attenuation  hepatic lesions are seen scattered  throughout the hepatic parenchyma. The largest of these is in  segment 1 measuring 2.5 x 2.1 cm. These are incompletely  characterized on this noncontrast CT examination, but favored to  represent cysts. The unenhanced appearance of the gallbladder,  pancreas, spleen and bilateral adrenal glands is unremarkable. No  significant volume of ascites. No pneumoperitoneum. No pathologic  distension of small bowel. A significant atherosclerosis is noted  throughout the abdominal and pelvic vasculature, without definite  aneurysm. The numerous calcifications in the prostate gland.  Foley balloon catheter in place within the lumen of the urinary  bladder.  Musculoskeletal: There are no aggressive appearing lytic or blastic  lesions noted in the visualized portions of the skeleton.  IMPRESSION:  1. The patient's old left double-J ureteral stent has partially  migrated into the pelvis, occupying only the distal half of the  left ureter. The remaining portion of the stent within the distal  half of the left ureter is heavily encrusted with calculus,  resulting in severe left ureteral obstruction, with severe left-  sided hydroureteronephrosis and extensive left-sided perinephric  stranding, likely related to upper pole forniceal rupture. The  left sided nephrostomy tube appears slightly  peripherally located  in the lower pole collecting system of the left kidney. Clinical  correlation for proper function of the left nephrostomy tube is  suggested, as this degree of obstruction is unusual in the presence  of a properly functioning nephrostomy tube.  2. Right-sided nephrostomy tube appears properly located. No  right sided hydroureteronephrosis.  3. Nonobstructive calculi within the collecting systems of the  kidneys bilaterally.  4. Small left pleural effusion with minimal bibasilar subsegmental  atelectasis in the lower lobes of the lungs bilaterally.  5. Multiple low-attenuation hepatic lesions incompletely  characterized on today's noncontrast CT examination. These are  favored to represent cysts, and could be further characterized with  contrast enhanced CT or ultrasound.  5. Atherosclerosis.  Original Report Authenticated By: Vinnie Langton, M.D.    Assessment: Bilateral calcified JJ stents. Resolved R hydronephrosis. Needs Cr/gfr bilaterally.   Plan: 1. Cr/gfr from both nephrostomies.            2. Continue with plan for  Referral to [facility], Dr. Edrick Oh, (Urology, [place]), for follow-up.            3. Plan to leave bilaterlal nephrostomies in place upon D/c. [name]    Alan Small I 11/07/2012, 9:59 AM

## 2012-11-08 LAB — RENAL FUNCTION PANEL
Albumin: 2 g/dL — ABNORMAL LOW (ref 3.5–5.2)
BUN: 53 mg/dL — ABNORMAL HIGH (ref 6–23)
Calcium: 8.6 mg/dL (ref 8.4–10.5)
Glucose, Bld: 89 mg/dL (ref 70–99)
Phosphorus: 4.1 mg/dL (ref 2.3–4.6)
Potassium: 4.4 mEq/L (ref 3.5–5.1)
Sodium: 136 mEq/L (ref 135–145)

## 2012-11-08 LAB — CULTURE, BLOOD (SINGLE)

## 2012-11-08 LAB — CBC
HCT: 28.9 % — ABNORMAL LOW (ref 39.0–52.0)
MCHC: 33.6 g/dL (ref 30.0–36.0)
MCV: 81.9 fL (ref 78.0–100.0)
Platelets: 210 10*3/uL (ref 150–400)
RDW: 16.6 % — ABNORMAL HIGH (ref 11.5–15.5)
WBC: 12.2 10*3/uL — ABNORMAL HIGH (ref 4.0–10.5)

## 2012-11-08 LAB — CREATININE CLEARANCE, URINE, 24 HOUR
Creatinine, Urine: <10 mg/dL
Urine Total Volume-CRCL: 5100 mL

## 2012-11-08 MED ORDER — SODIUM BICARBONATE 650 MG PO TABS
650.0000 mg | ORAL_TABLET | Freq: Three times a day (TID) | ORAL | Status: DC
  Administered 2012-11-08 (×3): 650 mg via ORAL
  Filled 2012-11-08 (×7): qty 1

## 2012-11-08 MED ORDER — RISPERIDONE 3 MG PO TABS
3.0000 mg | ORAL_TABLET | Freq: Every day | ORAL | Status: DC
  Administered 2012-11-08 – 2012-11-10 (×3): 3 mg via ORAL
  Filled 2012-11-08 (×4): qty 1

## 2012-11-08 MED ORDER — ACETAMINOPHEN-CODEINE #3 300-30 MG PO TABS
1.0000 | ORAL_TABLET | Freq: Once | ORAL | Status: AC
  Administered 2012-11-08: 1 via ORAL
  Filled 2012-11-08: qty 1

## 2012-11-08 MED ORDER — IPRATROPIUM-ALBUTEROL 18-103 MCG/ACT IN AERO: 2.0000 | INHALATION_SPRAY | Freq: Four times a day (QID) | RESPIRATORY_TRACT | Status: DC | PRN

## 2012-11-08 MED ORDER — DIPHENHYDRAMINE HCL 25 MG PO CAPS
25.0000 mg | ORAL_CAPSULE | Freq: Once | ORAL | Status: AC
  Administered 2012-11-08: 25 mg via ORAL
  Filled 2012-11-08: qty 1

## 2012-11-08 MED ORDER — FLUOXETINE HCL 40 MG PO CAPS: 40.0000 mg | ORAL_CAPSULE | Freq: Every day | ORAL | Status: DC

## 2012-11-08 MED ORDER — SENNA 8.6 MG PO TABS
1.0000 | ORAL_TABLET | Freq: Two times a day (BID) | ORAL | Status: DC | PRN
  Filled 2012-11-08: qty 1

## 2012-11-08 MED ORDER — BENZTROPINE MESYLATE 2 MG PO TABS
2.0000 mg | ORAL_TABLET | Freq: Two times a day (BID) | ORAL | Status: DC
  Administered 2012-11-08 – 2012-11-11 (×5): 2 mg via ORAL
  Filled 2012-11-08 (×9): qty 1

## 2012-11-08 MED ORDER — FLUOXETINE HCL 20 MG PO CAPS
40.0000 mg | ORAL_CAPSULE | Freq: Every day | ORAL | Status: DC
  Administered 2012-11-08 – 2012-11-11 (×4): 40 mg via ORAL
  Filled 2012-11-08 (×4): qty 2

## 2012-11-08 MED ORDER — LEVOTHYROXINE SODIUM 50 MCG PO TABS
50.0000 ug | ORAL_TABLET | Freq: Every day | ORAL | Status: DC
  Administered 2012-11-09 – 2012-11-11 (×3): 50 ug via ORAL
  Filled 2012-11-08 (×4): qty 1

## 2012-11-08 NOTE — Progress Notes (Signed)
[facility] Progress Note   Alan Small [id number] DOB: December 26, 1955 DOA: 11/03/2012 PCP: No primary provider on file.  HPI/Subjective: Awake,  no complaints.  Brief narrative: 57 year old male patient with history of schizophrenia and hypothyroidism. Was found to be increasingly confused at his primary residence/? Group home. He was also covered in feces. He was initially taken to the emergency department at [facility]. At that time laboratory data revealed markedly elevated serum creatinine of 10 with his baseline being 2.8 within the past 2 months. CT abdomen and pelvis revealed bilateral hydronephrosis with obstruction. Patient has an apparent history of hydronephrosis requiring stent placement at [facility]. Apparently the patient stands became calcified and were unable to be removed there were also obstructed based on most recent diagnostic imaging. He had been evaluated by a urologist at [facility] who discussed the case with the urologist at [facility] [address] Era Bumpers. The patient was subsequently transferred to [facility] for placement of percutaneous nephrostomy tubes. In addition to the above abnormalities urinalysis was consistent with probable UTI and followup CT of the abdomen and pelvis ER revealed left-sided perinephric stranding. In addition laboratory data from the previous facility revealed hyperkalemia and metabolic acidosis an EKG was concerning for T-wave peaking consistent with hyperkalemia so the patient was given IV calcium gluconate.  Assessment/Plan:  Acute Renal Failure due to obstructive uropathy -Secondary to bilateral hydronephrosis (per CT scan from [place]).  -He is now s/p bilateral percutaneous nephrostomy tubes by IR.  -Cr ~11 at admit and has trended down after perc nephro tubes placed -May need eventual renal eval but currently making excellent UOP. -The left side PCN tube is draining  significantly more than the right side PCN tube -CT of abdomen and pelvis showed resolution of right-sided hydronephrosis, there is still left-sided hydronephrosis. -Continue antibiotics, check BMP in a.m.  Bilateral Hydronephrosis   -2/2 stones/? calcified ureteral stents ?.  -s/p percutaneous nephrostomy.  -Urology consulted at [facility] by EDP- have re contacted office today/Tannenbaum on call -will definitely require urology FU after dc to internalize stents vs other procedure especially given presentation and prior history -Dr. Era Bumpers of the urology recommended return to [facility] to remove the calcified ureteral stents.  Acute Encephalopathy  -Presumed uremic -awake and less confused today - seems at baseline  Hyperkalemia/hypernatremia/metabolic acidosis -2/2 ARF-K+ stable and now normalized (received Kayexalate this admit) -Na+ increased  (6/5) so decreased Bicarb to 1 amp and changed to D5W for base fluid- Na+ now normal -EKG without acute changes.  -follow labs  Sepsis 2/2 Pyelonephritis  -Urine culture showed enterococcus and coagulase negative staph, patient is on Zosyn. -Methicillin sensitive coagulase-negative staph, amoxicillin sensitive enterococcus. -Likely he can be discharged on oral beta-lactam antibiotic like amoxicillin or Ceftin  Leukocytosis  -2/2 pyelonephritis.  -trending downward on anbx's  Hypothyroidism Continue home dose Synthroid.    DVT prophylaxis: SCDs Code Status: Full Family Communication:  Disposition Plan: Remain as inpatient Isolation: None Nutritional Status: Suspected acute protein calorie malnutrition related to acute uremia and altered mentation  Consultants: Interventional radiology Urology  Procedures: Insertion of bilateral percutaneous nephrostomy tubes interventional radiology 6/4  Antibiotics: Zosyn 6/3 >>>     Objective: Blood pressure 106/91, pulse 98, temperature 98.5 F (36.9 C), temperature source Oral, resp.  rate 17, height 6\' 1"  (1.854 m), weight 66.588 kg (146 lb 12.8 oz), SpO2 100.00%.  Intake/Output Summary (Last 24 hours) at 11/08/12 1848 Last data filed at 11/08/12 1700  Gross per  24 hour  Intake   3410 ml  Output   4990 ml  Net  -1580 ml     Exam: General: No acute respiratory distress Lungs: Clear to auscultation bilaterally without wheezes or crackles, RA Cardiovascular: Regular rate and rhythm without murmur gallop or rub normal S1 and S2, no peripheral edema or JVD Abdomen: Nontender, nondistended, soft, bowel sounds positive, no rebound, no ascites, no appreciable mass Genitourinary: Bilateral percutaneous nephrostomy tubes in place with moderate amount of blood-tinged urine without purulence Musculoskeletal: No significant cyanosis, clubbing of bilateral lower extremities Neurological: Alert and oriented x name and place, moves all extremities x 4 without focal neurological deficits, CN 2-12 intact  Scheduled Meds: Scheduled Meds: . feeding supplement  1 Container Oral TID BM  . pantoprazole  40 mg Oral Daily  . piperacillin-tazobactam (ZOSYN)  IV  2.25 g Intravenous Q8H  . polyethylene glycol  17 g Oral Daily  . sodium bicarbonate  650 mg Oral TID   Continuous Infusions: . sodium chloride 100 mL/hr at 11/08/12 1614  . albuterol 10 mg/hr (11/03/12 0450)    Data Reviewed: Basic Metabolic Panel:  Recent Labs Lab 11/04/12 0440 11/05/12 0510 11/06/12 0432 11/07/12 0405 11/07/12 1226 11/07/12 1450 11/08/12 0535  NA 153* 140 136 138  --   --  136  K 4.6 3.6 3.5 4.4  --   --  4.4  CL 114* 101 98 106  --   --  109  CO2 20 19 24 21   --   --  16*  GLUCOSE 85 78 86 87  --   --  89  BUN 149* 100* 76* 64*  --   --  53*  CREATININE 9.99* 6.36* 4.70* 4.13* 3.44* 3.44* 3.44*  CALCIUM 9.3 8.4 8.1* 8.4  --   --  8.6  PHOS  --  4.6 4.4 4.1  --   --  4.1   Liver Function Tests:  Recent Labs Lab 11/03/12 0150 11/05/12 0510 11/06/12 0432 11/07/12 0405 11/08/12 0535   AST 24  --   --   --   --   ALT 25  --   --   --   --   ALKPHOS 95  --   --   --   --   BILITOT 0.2*  --   --   --   --   PROT 6.9  --   --   --   --   ALBUMIN 2.7* 1.9* 2.0* 2.2* 2.0*   No results found for this basename: LIPASE, AMYLASE,  in the last 168 hours No results found for this basename: AMMONIA,  in the last 168 hours CBC:  Recent Labs Lab 11/03/12 0150  11/04/12 0440 11/05/12 0510 11/06/12 0432 11/07/12 0405 11/08/12 0500  WBC 22.4*  < > 15.7* 12.2* 10.8* 11.4* 12.2*  NEUTROABS 20.2*  --   --   --   --   --   --   HGB 9.7*  < > 10.3* 9.9* 10.4* 10.3* 9.7*  HCT 28.8*  < > 30.2* 28.7* 30.3* 31.0* 28.9*  MCV 83.2  < > 82.1 81.3 81.5 82.9 81.9  PLT 178  < > 221 206 188 222 210  < > = values in this interval not displayed. Cardiac Enzymes: No results found for this basename: CKTOTAL, CKMB, CKMBINDEX, TROPONINI,  in the last 168 hours BNP (last 3 results) No results found for this basename: PROBNP,  in the last 8760 hours CBG:  Recent  Labs Lab 11/03/12 2325 11/04/12 0330 11/04/12 0813 11/04/12 2137 11/05/12 0415  GLUCAP 95 88 78 91 93    Recent Results (from the past 240 hour(s))  CULTURE, BLOOD (ROUTINE X 2)     Status: None   Collection Time    11/03/12  1:45 AM      Result Value Range Status   Specimen Description BLOOD LEFT ARM   Final   Special Requests     Final   Value: BOTTLES DRAWN AEROBIC AND ANAEROBIC 6CC,PT ON ROCEPHIN   Culture  Setup Time 11/03/2012 10:47   Final   Culture     Final   Value:        BLOOD CULTURE RECEIVED NO GROWTH TO DATE CULTURE WILL BE HELD FOR 5 DAYS BEFORE ISSUING A FINAL NEGATIVE REPORT   Report Status PENDING   Incomplete  CULTURE, BLOOD (ROUTINE X 2)     Status: None   Collection Time    11/03/12  1:50 AM      Result Value Range Status   Specimen Description BLOOD RIGHT ARM   Final   Special Requests BOTTLES DRAWN AEROBIC ONLY 3CC,PT ON ROCEPHIN   Final   Culture  Setup Time 11/03/2012 10:48   Final   Culture      Final   Value:        BLOOD CULTURE RECEIVED NO GROWTH TO DATE CULTURE WILL BE HELD FOR 5 DAYS BEFORE ISSUING A FINAL NEGATIVE REPORT   Report Status PENDING   Incomplete  MRSA PCR SCREENING     Status: None   Collection Time    11/03/12  2:29 AM      Result Value Range Status   MRSA by PCR NEGATIVE  NEGATIVE Final   Comment:            The GeneXpert MRSA Assay (FDA     approved for NASAL specimens     only), is one component of a     comprehensive MRSA colonization     surveillance program. It is not     intended to diagnose MRSA     infection nor to guide or     monitor treatment for     MRSA infections.  URINE CULTURE     Status: None   Collection Time    11/03/12  2:02 PM      Result Value Range Status   Specimen Description URINE, CLEAN CATCH   Final   Special Requests NONE   Final   Culture  Setup Time 11/03/2012 22:56   Final   Colony Count >=100,000 COLONIES/ML   Final   Culture     Final   Value: ENTEROCOCCUS SPECIES     STAPHYLOCOCCUS SPECIES (COAGULASE NEGATIVE)     Note: RIFAMPIN AND GENTAMICIN SHOULD NOT BE USED AS SINGLE DRUGS FOR TREATMENT OF STAPH INFECTIONS.   Report Status 11/06/2012 FINAL   Final   Organism ID, Bacteria ENTEROCOCCUS SPECIES   Final   Organism ID, Bacteria STAPHYLOCOCCUS SPECIES (COAGULASE NEGATIVE)   Final     **If unable to reach the above provider after paging please contact the Flow Manager @ [phone]  On-Call/Text Page:      [email or web address]      password [email or web address]  If 7PM-7AM, please contact night-coverage [email or web address] Password [facility] 11/08/2012, 6:48 PM   LOS: 5 days   I have examined the patient, reviewed the chart and modified the above note which I agree with.   Anmol Fleck,MD [id number] 11/08/2012,  6:48 PM

## 2012-11-08 NOTE — Progress Notes (Signed)
Subjective: B PCNs 6/4 Cr coming down Output great from L Slight from R- atrophic kidney  Objective: Vital signs in last 24 hours: Temp:  [97.6 F (36.4 C)-98.6 F (37 C)] 97.6 F (36.4 C) (06/09 0929) Pulse Rate:  [64-86] 64 (06/09 0929) Resp:  [17-18] 17 (06/09 0929) BP: (98-111)/(61-78) 99/61 mmHg (06/09 0929) SpO2:  [99 %-100 %] 100 % (06/09 0929) Weight:  [146 lb 12.8 oz (66.588 kg)] 146 lb 12.8 oz (66.588 kg) (06/08 2152) Last BM Date: 11/05/12  Intake/Output from previous day: 06/08 0701 - 06/09 0700 In: 2020 [P.O.:720; I.V.:1200; IV Piggyback:100] Out: 3950 [Urine:3950] Intake/Output this shift: Total I/O In: 240 [P.O.:240] Out: 850 [Urine:850]   PE: afeb; vss L PCN intact; clean and dry Output 3.8 L yesterday- yellow  R PVC; intact; clean and dry Output 150 cc yesterday- yellow Atrophic kidney per CT  BUN/CR: 53/3.4 Wbc: 12.2  Lab Results:   Recent Labs  11/07/12 0405 11/08/12 0500  WBC 11.4* 12.2*  HGB 10.3* 9.7*  HCT 31.0* 28.9*  PLT 222 210   BMET  Recent Labs  11/07/12 0405 11/08/12 0535  NA 138 136  K 4.4 4.4  CL 106 109  CO2 21 16*  GLUCOSE 87 89  BUN 64* 53*  CREATININE 4.13* 3.44*  CALCIUM 8.4 8.6   PT/INR No results found for this basename: LABPROT, INR,  in the last 72 hours ABG No results found for this basename: PHART, PCO2, PO2, HCO3,  in the last 72 hours  Studies/Results: Ct Abdomen Pelvis Wo Contrast  11/06/2012   *RADIOLOGY REPORT*  Clinical Data: Bilateral nephrostomy tubes placed.  Left side draining significantly more than the right side.  CT ABDOMEN AND PELVIS WITHOUT CONTRAST  Technique:  Multidetector CT imaging of the abdomen and pelvis was performed following the standard protocol without intravenous contrast.  Comparison: No priors.  Findings:  Lung Bases: A small left pleural effusion.  Dependent atelectasis in the lower lobes of the lungs bilaterally.  Abdomen/Pelvis:  Bilateral percutaneous nephrostomy  tubes are in position.  The right tube appears properly located within the right renal pelvis.  There is some high-density material within the right renal pelvis measuring approximately 2.2 cm compatible with a large calculus.  A small nonobstructive calculi are also noted in the lower pole of the right kidney, largest of which is 10 mm in diameter.  There is a small locule of gas in the right renal collecting system, iatrogenic.  No right hydronephrosis.  Small right-sided perinephric stranding. The right kidney is mildly atrophic.  The left nephrostomy tube extends into the lower pole collecting system of the left kidney and is slightly peripherally located.  Tiny nonobstructive calculus measuring only 3 mm in the interpolar collecting system of the left kidney.  There is severe left-sided hydroureteronephrosis extensive left-sided perinephric stranding.  Coronal images suggest probable forniceal rupture in the upper pole (image 51 of series 5).  The hydroureteronephrosis abruptly terminates in the mid left ureter where there is a heavily calcified retained left ureteral stent which has migrated into the mid and distal left ureter, extending into the pelvis.  The ureteral portion of the stent is surrounded by a large amount of high attenuation material, likely to be completely encrusted with calculus.  Numerous low attenuation hepatic lesions are seen scattered throughout the hepatic parenchyma.  The largest of these is in segment 1 measuring 2.5 x 2.1 cm.  These are incompletely characterized on this noncontrast CT examination, but favored to  represent cysts.  The unenhanced appearance of the gallbladder, pancreas, spleen and bilateral adrenal glands is unremarkable.  No significant volume of ascites.  No pneumoperitoneum.  No pathologic distension of small bowel.  A significant atherosclerosis is noted throughout the abdominal and pelvic vasculature, without definite aneurysm.  The numerous calcifications in the  prostate gland. Foley balloon catheter in place within the lumen of the urinary bladder.  Musculoskeletal: There are no aggressive appearing lytic or blastic lesions noted in the visualized portions of the skeleton.  IMPRESSION: 1.  The patient's old left double-J ureteral stent has partially migrated into the pelvis, occupying only the distal half of the left ureter.  The remaining portion of the stent within the distal half of the left ureter is heavily encrusted with calculus, resulting in severe left ureteral obstruction, with severe left- sided hydroureteronephrosis and extensive left-sided perinephric stranding, likely related to upper pole forniceal rupture.  The left sided nephrostomy tube appears slightly peripherally located in the lower pole collecting system of the left kidney.  Clinical correlation for proper function of the left nephrostomy tube is suggested, as this degree of obstruction is unusual in the presence of a properly functioning nephrostomy tube. 2.  Right-sided nephrostomy tube appears properly located.  No right sided hydroureteronephrosis. 3.  Nonobstructive calculi within the collecting systems of the kidneys bilaterally. 4.  Small left pleural effusion with minimal bibasilar subsegmental atelectasis in the lower lobes of the lungs bilaterally. 5.  Multiple low-attenuation hepatic lesions incompletely characterized on today's noncontrast CT examination.  These are favored to represent cysts, and could be further characterized with contrast enhanced CT or ultrasound. 5.  Atherosclerosis.   Original Report Authenticated By: Vinnie Langton, M.D.    Anti-infectives: Anti-infectives   Start     Dose/Rate Route Frequency Ordered Stop   11/03/12 1400  piperacillin-tazobactam (ZOSYN) IVPB 2.25 g     2.25 g 100 mL/hr over 30 Minutes Intravenous 3 times per day 11/03/12 0557     11/03/12 0330  piperacillin-tazobactam (ZOSYN) IVPB 2.25 g     2.25 g 100 mL/hr over 30 Minutes Intravenous   Once 11/03/12 0314 11/03/12 0450      Assessment/Plan: s/p * No surgery found *  B PCNs intact Will follow Plan per Primary MD   LOS: 5 days    Alan Small A 11/08/2012

## 2012-11-09 DIAGNOSIS — E872 Acidosis: Secondary | ICD-10-CM | POA: Diagnosis not present

## 2012-11-09 LAB — RENAL FUNCTION PANEL
BUN: 42 mg/dL — ABNORMAL HIGH (ref 6–23)
CO2: 16 mEq/L — ABNORMAL LOW (ref 19–32)
Chloride: 116 mEq/L — ABNORMAL HIGH (ref 96–112)
GFR calc Af Amer: 25 mL/min — ABNORMAL LOW (ref >90)
Glucose, Bld: 84 mg/dL (ref 70–99)
Phosphorus: 4 mg/dL (ref 2.3–4.6)
Potassium: 4.6 mEq/L (ref 3.5–5.1)
Sodium: 142 mEq/L (ref 135–145)

## 2012-11-09 LAB — CULTURE, BLOOD (ROUTINE X 2): Culture: NO GROWTH

## 2012-11-09 MED ORDER — AMOXICILLIN 500 MG PO CAPS
500.0000 mg | ORAL_CAPSULE | Freq: Two times a day (BID) | ORAL | Status: DC
  Administered 2012-11-09 – 2012-11-11 (×5): 500 mg via ORAL
  Filled 2012-11-09 (×6): qty 1

## 2012-11-09 MED ORDER — SODIUM CHLORIDE 0.45 % IV SOLN
INTRAVENOUS | Status: DC
  Administered 2012-11-09 – 2012-11-10 (×2): via INTRAVENOUS
  Administered 2012-11-11: 75 mL/h via INTRAVENOUS

## 2012-11-09 MED ORDER — SODIUM BICARBONATE 650 MG PO TABS
1300.0000 mg | ORAL_TABLET | Freq: Three times a day (TID) | ORAL | Status: DC
  Administered 2012-11-09 – 2012-11-11 (×8): 1300 mg via ORAL
  Filled 2012-11-09 (×9): qty 2

## 2012-11-09 NOTE — Evaluation (Signed)
Physical Therapy Evaluation Patient Details Name: Alan Small MRN: [id number] DOB: Mar 06, 1956 Today's Date: 11/09/2012 Time: [id number] PT Time Calculation (min): 25 min  PT Assessment / Plan / Recommendation Clinical Impression    Pt admitted with acute kidney failure due to obstruction and underwent placement of . Pt currently with functional limitations due to the deficits listed below (PT Problem List). Pt will benefit from skilled PT to increase their independence and safety with mobility to allow discharge to SNF.      PT Assessment  Patient needs continued PT services    Follow Up Recommendations  SNF    Does the patient have the potential to tolerate intense rehabilitation      Barriers to Discharge        Equipment Recommendations  None recommended by PT    Recommendations for Other Services     Frequency Min 3X/week    Precautions / Restrictions Precautions Precautions: Fall   Pertinent Vitals/Pain N/A      Mobility  Bed Mobility Bed Mobility: Supine to Sit;Sitting - Scoot to Edge of Bed;Sit to Supine Supine to Sit: 7: Independent;HOB flat Sitting - Scoot to Edge of Bed: 7: Independent Sit to Supine: 7: Independent;HOB flat Transfers Transfers: Sit to Stand;Stand to Sit Sit to Stand: 4: Min assist;With upper extremity assist;From bed Stand to Sit: 4: Min assist;With upper extremity assist;To bed Ambulation/Gait Ambulation/Gait Assistance: 4: Min assist Ambulation Distance (Feet): 150 Feet Assistive device: Straight cane;Other (Comment) (used rail with free hand) Ambulation/Gait Assistance Details: Assist for balance and support when knees began to "give way". Gait Pattern: Step-through pattern;Decreased stride length;Trunk flexed;Narrow base of support Gait velocity: decr    Exercises     PT Diagnosis: Difficulty walking;Generalized weakness  PT Problem List: Decreased strength;Decreased balance;Decreased mobility;Decreased knowledge of use of  DME;Decreased knowledge of precautions PT Treatment Interventions: DME instruction;Gait training;Patient/family education;Functional mobility training;Therapeutic activities;Balance training;Therapeutic exercise   PT Goals Acute Rehab PT Goals PT Goal Formulation: With patient Time For Goal Achievement: 11/16/12 Potential to Achieve Goals: Good Pt will go Sit to Stand: with modified independence PT Goal: Sit to Stand - Progress: Goal set today Pt will go Stand to Sit: with modified independence PT Goal: Stand to Sit - Progress: Goal set today Pt will Ambulate: 51 - 150 feet;with modified independence;with least restrictive assistive device PT Goal: Ambulate - Progress: Goal set today  Visit Information  Last PT Received On: 11/09/12 Assistance Needed: +1    Subjective Data  Subjective: Pt states he has fallen "a lot" at home. Patient Stated Goal: Go home   Prior Functioning  Home Living Additional Comments: Pt states he lives in a second story apt but the medical record says he lives at a group home. Prior Function Vocation: On disability Comments: Amb with straight cane and has history of falls. Communication Communication: No difficulties    Cognition  Cognition Arousal/Alertness: Awake/alert Behavior During Therapy: WFL for tasks assessed/performed Overall Cognitive Status: History of cognitive impairments - at baseline    Extremity/Trunk Assessment Right Lower Extremity Assessment RLE ROM/Strength/Tone: Deficits RLE ROM/Strength/Tone Deficits: grossly 4/5 Left Lower Extremity Assessment LLE ROM/Strength/Tone: Deficits LLE ROM/Strength/Tone Deficits: grossly 4/5   Balance Balance Balance Assessed: Yes Static Standing Balance Static Standing - Balance Support: Right upper extremity supported Static Standing - Level of Assistance: 4: Min assist  End of Session PT - End of Session Equipment Utilized During Treatment: Gait belt Activity Tolerance: Patient tolerated  treatment well Patient left: in bed;with call bell/phone  within reach;with bed alarm set Nurse Communication: Mobility status  GP     [facility] 11/09/2012, 11:26 AM  Suanne Marker PT [phone]

## 2012-11-09 NOTE — Progress Notes (Signed)
  Subjective: B PCNs placed 6/4 Output great on left Minimal on Rt- atrophic per CT Bun/Cr trending down  Objective: Vital signs in last 24 hours: Temp:  [97.6 F (36.4 C)-99 F (37.2 C)] 98.8 F (37.1 C) (06/10 0447) Pulse Rate:  [64-98] 83 (06/10 0447) Resp:  [17-18] 18 (06/10 0447) BP: (99-118)/(60-91) 107/80 mmHg (06/10 0447) SpO2:  [99 %-100 %] 99 % (06/10 0447) Weight:  [145 lb 15.1 oz (66.2 kg)] 145 lb 15.1 oz (66.2 kg) (06/09 2051) Last BM Date: 11/08/12  Intake/Output from previous day: 06/09 0701 - 06/10 0700 In: 3091.7 [P.O.:700; I.V.:2271.7; IV Piggyback:100] Out: [id number] [Urine:6240] Intake/Output this shift:   PE:  Afeb; vss Up in bed; eating well  Sites of B PCNs NT; no bleeding; clean and dry  Output R: 240 cc yesterday- clearing- yellow             L: 5.7 L - yellow  Bun/Cr: 42/3.02   Lab Results:   Recent Labs  11/07/12 0405 11/08/12 0500  WBC 11.4* 12.2*  HGB 10.3* 9.7*  HCT 31.0* 28.9*  PLT 222 210   BMET  Recent Labs  11/08/12 0535 11/09/12 0520  NA 136 142  K 4.4 4.6  CL 109 116*  CO2 16* 16*  GLUCOSE 89 84  BUN 53* 42*  CREATININE 3.44* 3.02*  CALCIUM 8.6 8.2*   PT/INR No results found for this basename: LABPROT, INR,  in the last 72 hours ABG No results found for this basename: PHART, PCO2, PO2, HCO3,  in the last 72 hours  Studies/Results: No results found.  Anti-infectives: Anti-infectives   Start     Dose/Rate Route Frequency Ordered Stop   11/03/12 1400  piperacillin-tazobactam (ZOSYN) IVPB 2.25 g     2.25 g 100 mL/hr over 30 Minutes Intravenous 3 times per day 11/03/12 0557     11/03/12 0330  piperacillin-tazobactam (ZOSYN) IVPB 2.25 g     2.25 g 100 mL/hr over 30 Minutes Intravenous  Once 11/03/12 0314 11/03/12 0450      Assessment/Plan: s/p * No surgery found *  B PCNs placed 6/4 Intact; doing well Renal fxn - good improvement Plan per MD- back to [employer]   LOS: 6 days     [place] A 11/09/2012

## 2012-11-09 NOTE — Progress Notes (Signed)
[facility] Progress Note   Alan Small [id number] DOB: June 26, 1955 DOA: 11/03/2012 PCP: No primary provider on file.  HPI/Subjective: Awake,  no complaints.  Brief narrative: 57 year old male patient with history of schizophrenia and hypothyroidism. Was found to be increasingly confused at his primary residence/? Group home. He was also covered in feces. He was initially taken to the emergency department at [facility]. At that time laboratory data revealed markedly elevated serum creatinine of 10 with his baseline being 2.8 within the past 2 months. CT abdomen and pelvis revealed bilateral hydronephrosis with obstruction. Patient has an apparent history of hydronephrosis requiring stent placement at [facility]. Apparently the patient stands became calcified and were unable to be removed there were also obstructed based on most recent diagnostic imaging. He had been evaluated by a urologist at [facility] who discussed the case with the urologist at [place] Dr. Era Bumpers. The patient was subsequently transferred to [facility] for placement of percutaneous nephrostomy tubes. In addition to the above abnormalities urinalysis was consistent with probable UTI and followup CT of the abdomen and pelvis ER revealed left-sided perinephric stranding. In addition laboratory data from the previous facility revealed hyperkalemia and metabolic acidosis an EKG was concerning for T-wave peaking consistent with hyperkalemia so the patient was given IV calcium gluconate.  Assessment/Plan:  Acute Renal Failure due to obstructive uropathy -Secondary to bilateral hydronephrosis (per CT scan from [place]).  -He is now s/p bilateral percutaneous nephrostomy tubes by IR.  -Cr ~11 at admit and has trended down after perc nephro tubes placed -May need eventual renal eval but currently making excellent UOP. -The left side PCN tube is draining  significantly more than the right side PCN tube -CT of abdomen and pelvis showed resolution of right-sided hydronephrosis, there is still left-sided hydronephrosis. -Continue antibiotics, check BMP in a.m.  Bilateral Hydronephrosis   -2/2 stones/? calcified ureteral stents ?.  -s/p percutaneous nephrostomy.  -Urology consulted at [facility] by EDP- have re contacted office today/[place] on call -will definitely require urology FU after dc to internalize stents vs other procedure especially given presentation and prior history -Dr. Era Bumpers of the urology recommended return to [facility] to remove the calcified ureteral stents.  Acute Encephalopathy  -Presumed uremic -awake and less confused today - seems at baseline  Hyperkalemia/hypernatremia/metabolic acidosis -2/2 ARF-K+ stable and now normalized (received Kayexalate this admit) -Na+ increased  (6/5) so decreased Bicarb to 1 amp and changed to D5W for base fluid Will increase sodium bicarbonate tablets to 2 tablets 3 times daily. -EKG without acute changes.  -follow labs  Sepsis 2/2 Pyelonephritis  -Urine culture showed enterococcus and coagulase negative staph, patient is on Zosyn. -Methicillin sensitive coagulase-negative staph, amoxicillin sensitive enterococcus. -Change zosyn to amoxicillin and complete 2 week course.  Leukocytosis  -2/2 pyelonephritis.  -Change zosyn to amoxicillin  Hypothyroidism Continue home dose Synthroid.    DVT prophylaxis: SCDs Code Status: Full Family Communication:  Disposition Plan: Remain as inpatient Isolation: None Nutritional Status: Suspected acute protein calorie malnutrition related to acute uremia and altered mentation  Consultants: Interventional radiology Urology  Procedures: Insertion of bilateral percutaneous nephrostomy tubes interventional radiology 6/4  Antibiotics: Zosyn 6/3 >>> 11/09/12 Amoxicillin 11/09/12     Objective: Blood pressure 111/73, pulse 77,  temperature 97.7 F (36.5 C), temperature source Oral, resp. rate 18, height 6\' 1"  (1.854 m), weight 66.2 kg (145 lb 15.1 oz), SpO2 100.00%.  Intake/Output Summary (Last 24 hours) at 11/09/12 2023 Last data  filed at 11/09/12 1921  Gross per 24 hour  Intake 3301.67 ml  Output   4625 ml  Net -1323.33 ml     Exam: General: No acute respiratory distress Lungs: Clear to auscultation bilaterally without wheezes or crackles, RA Cardiovascular: Regular rate and rhythm without murmur gallop or rub normal S1 and S2, no peripheral edema or JVD Abdomen: Nontender, nondistended, soft, bowel sounds positive, no rebound, no ascites, no appreciable mass Genitourinary: Bilateral percutaneous nephrostomy tubes in place with moderate amount of blood-tinged urine without purulence Musculoskeletal: No significant cyanosis, clubbing of bilateral lower extremities Neurological: Alert and oriented x name and place, moves all extremities x 4 without focal neurological deficits, CN 2-12 intact  Scheduled Meds: Scheduled Meds: . amoxicillin  500 mg Oral Q12H  . benztropine  2 mg Oral BID  . feeding supplement  1 Container Oral TID BM  . FLUoxetine  40 mg Oral Daily  . levothyroxine  50 mcg Oral QAC breakfast  . pantoprazole  40 mg Oral Daily  . polyethylene glycol  17 g Oral Daily  . risperiDONE  3 mg Oral QHS  . sodium bicarbonate  1,300 mg Oral TID   Continuous Infusions: . sodium chloride 75 mL/hr at 11/09/12 1606  . albuterol 10 mg/hr (11/03/12 0450)    Data Reviewed: Basic Metabolic Panel:  Recent Labs Lab 11/05/12 0510 11/06/12 0432 11/07/12 0405 11/07/12 1226 11/07/12 1450 11/08/12 0535 11/09/12 0520  NA 140 136 138  --   --  136 142  K 3.6 3.5 4.4  --   --  4.4 4.6  CL 101 98 106  --   --  109 116*  CO2 19 24 21   --   --  16* 16*  GLUCOSE 78 86 87  --   --  89 84  BUN 100* 76* 64*  --   --  53* 42*  CREATININE 6.36* 4.70* 4.13* 3.44* 3.44* 3.44* 3.02*  CALCIUM 8.4 8.1* 8.4  --    --  8.6 8.2*  PHOS 4.6 4.4 4.1  --   --  4.1 4.0   Liver Function Tests:  Recent Labs Lab 11/03/12 0150 11/05/12 0510 11/06/12 0432 11/07/12 0405 11/08/12 0535 11/09/12 0520  AST 24  --   --   --   --   --   ALT 25  --   --   --   --   --   ALKPHOS 95  --   --   --   --   --   BILITOT 0.2*  --   --   --   --   --   PROT 6.9  --   --   --   --   --   ALBUMIN 2.7* 1.9* 2.0* 2.2* 2.0* 1.8*   No results found for this basename: LIPASE, AMYLASE,  in the last 168 hours No results found for this basename: AMMONIA,  in the last 168 hours CBC:  Recent Labs Lab 11/03/12 0150  11/04/12 0440 11/05/12 0510 11/06/12 0432 11/07/12 0405 11/08/12 0500  WBC 22.4*  < > 15.7* 12.2* 10.8* 11.4* 12.2*  NEUTROABS 20.2*  --   --   --   --   --   --   HGB 9.7*  < > 10.3* 9.9* 10.4* 10.3* 9.7*  HCT 28.8*  < > 30.2* 28.7* 30.3* 31.0* 28.9*  MCV 83.2  < > 82.1 81.3 81.5 82.9 81.9  PLT 178  < > 221 206 188  222 210  < > = values in this interval not displayed. Cardiac Enzymes: No results found for this basename: CKTOTAL, CKMB, CKMBINDEX, TROPONINI,  in the last 168 hours BNP (last 3 results) No results found for this basename: PROBNP,  in the last 8760 hours CBG:  Recent Labs Lab 11/03/12 2325 11/04/12 0330 11/04/12 0813 11/04/12 2137 11/05/12 0415  GLUCAP 95 88 78 91 93    Recent Results (from the past 240 hour(s))  CULTURE, BLOOD (ROUTINE X 2)     Status: None   Collection Time    11/03/12  1:45 AM      Result Value Range Status   Specimen Description BLOOD LEFT ARM   Final   Special Requests     Final   Value: BOTTLES DRAWN AEROBIC AND ANAEROBIC 6CC,PT ON ROCEPHIN   Culture  Setup Time 11/03/2012 10:47   Final   Culture NO GROWTH 5 DAYS   Final   Report Status 11/09/2012 FINAL   Final  CULTURE, BLOOD (ROUTINE X 2)     Status: None   Collection Time    11/03/12  1:50 AM      Result Value Range Status   Specimen Description BLOOD RIGHT ARM   Final   Special Requests BOTTLES  DRAWN AEROBIC ONLY 3CC,PT ON ROCEPHIN   Final   Culture  Setup Time 11/03/2012 10:48   Final   Culture NO GROWTH 5 DAYS   Final   Report Status 11/09/2012 FINAL   Final  MRSA PCR SCREENING     Status: None   Collection Time    11/03/12  2:29 AM      Result Value Range Status   MRSA by PCR NEGATIVE  NEGATIVE Final   Comment:            The GeneXpert MRSA Assay (FDA     approved for NASAL specimens     only), is one component of a     comprehensive MRSA colonization     surveillance program. It is not     intended to diagnose MRSA     infection nor to guide or     monitor treatment for     MRSA infections.  URINE CULTURE     Status: None   Collection Time    11/03/12  2:02 PM      Result Value Range Status   Specimen Description URINE, CLEAN CATCH   Final   Special Requests NONE   Final   Culture  Setup Time 11/03/2012 22:56   Final   Colony Count >=100,000 COLONIES/ML   Final   Culture     Final   Value: ENTEROCOCCUS SPECIES     STAPHYLOCOCCUS SPECIES (COAGULASE NEGATIVE)     Note: RIFAMPIN AND GENTAMICIN SHOULD NOT BE USED AS SINGLE DRUGS FOR TREATMENT OF STAPH INFECTIONS.   Report Status 11/06/2012 FINAL   Final   Organism ID, Bacteria ENTEROCOCCUS SPECIES   Final   Organism ID, Bacteria STAPHYLOCOCCUS SPECIES (COAGULASE NEGATIVE)   Final     **If unable to reach the above provider after paging please contact the Flow Manager @ [phone]  On-Call/Text Page:      [email or web address]      password [email or web address]  If 7PM-7AM, please contact night-coverage [email or web address] Password [facility] 11/09/2012, 8:23 PM   LOS: 6 days   I have examined the patient, reviewed the chart and modified the above note which I agree with.   THOMPSON,DANIEL,MD [id number] 11/09/2012, 8:23 PM

## 2012-11-09 NOTE — Progress Notes (Signed)
Subjective: Patient reports tolerating PO and pain control good. Pt is sleeping this am but awakes w/o difficulty. Voices no complaints. Bilateral nephrostomy tubes intact draining amber urine. Objective: Vital signs in last 24 hours: Temp:  [97.6 F (36.4 C)-99 F (37.2 C)] 98.8 F (37.1 C) (06/10 0447) Pulse Rate:  [64-98] 83 (06/10 0447) Resp:  [17-18] 18 (06/10 0447) BP: (99-118)/(60-91) 107/80 mmHg (06/10 0447) SpO2:  [99 %-100 %] 99 % (06/10 0447) Weight:  [66.2 kg (145 lb 15.1 oz)] 66.2 kg (145 lb 15.1 oz) (06/09 2051)  Intake/Output from previous day: 06/09 0701 - 06/10 0700 In: 3091.7 [P.O.:700; I.V.:2271.7; IV Piggyback:100] Out: [id number] [Urine:6240] Intake/Output this shift:    Physical Exam:  General:alert, cooperative and no distress GI: soft, non tender, normal bowel sounds, no palpable masses, no organomegaly, no inguinal hernia Male genitalia: not indicated   Lab Results:  Recent Labs  11/07/12 0405 11/08/12 0500  HGB 10.3* 9.7*  HCT 31.0* 28.9*   BMET  Recent Labs  11/08/12 0535 11/09/12 0520  NA 136 142  K 4.4 4.6  CL 109 116*  CO2 16* 16*  GLUCOSE 89 84  BUN 53* 42*  CREATININE 3.44* 3.02*  CALCIUM 8.6 8.2*   No results found for this basename: LABPT, INR,  in the last 72 hours No results found for this basename: LABURIN,  in the last 72 hours Results for orders placed during the hospital encounter of 11/03/12  CULTURE, BLOOD (ROUTINE X 2)     Status: None   Collection Time    11/03/12  1:45 AM      Result Value Range Status   Specimen Description BLOOD LEFT ARM   Final   Special Requests     Final   Value: BOTTLES DRAWN AEROBIC AND ANAEROBIC 6CC,PT ON ROCEPHIN   Culture  Setup Time 11/03/2012 10:47   Final   Culture     Final   Value:        BLOOD CULTURE RECEIVED NO GROWTH TO DATE CULTURE WILL BE HELD FOR 5 DAYS BEFORE ISSUING A FINAL NEGATIVE REPORT   Report Status PENDING   Incomplete  CULTURE, BLOOD (ROUTINE X 2)     Status:  None   Collection Time    11/03/12  1:50 AM      Result Value Range Status   Specimen Description BLOOD RIGHT ARM   Final   Special Requests BOTTLES DRAWN AEROBIC ONLY 3CC,PT ON ROCEPHIN   Final   Culture  Setup Time 11/03/2012 10:48   Final   Culture     Final   Value:        BLOOD CULTURE RECEIVED NO GROWTH TO DATE CULTURE WILL BE HELD FOR 5 DAYS BEFORE ISSUING A FINAL NEGATIVE REPORT   Report Status PENDING   Incomplete  MRSA PCR SCREENING     Status: None   Collection Time    11/03/12  2:29 AM      Result Value Range Status   MRSA by PCR NEGATIVE  NEGATIVE Final   Comment:            The GeneXpert MRSA Assay (FDA     approved for NASAL specimens     only), is one component of a     comprehensive MRSA colonization     surveillance program. It is not     intended to diagnose MRSA     infection nor to guide or     monitor treatment for  MRSA infections.  URINE CULTURE     Status: None   Collection Time    11/03/12  2:02 PM      Result Value Range Status   Specimen Description URINE, CLEAN CATCH   Final   Special Requests NONE   Final   Culture  Setup Time 11/03/2012 22:56   Final   Colony Count >=100,000 COLONIES/ML   Final   Culture     Final   Value: ENTEROCOCCUS SPECIES     STAPHYLOCOCCUS SPECIES (COAGULASE NEGATIVE)     Note: RIFAMPIN AND GENTAMICIN SHOULD NOT BE USED AS SINGLE DRUGS FOR TREATMENT OF STAPH INFECTIONS.   Report Status 11/06/2012 FINAL   Final   Organism ID, Bacteria ENTEROCOCCUS SPECIES   Final   Organism ID, Bacteria STAPHYLOCOCCUS SPECIES (COAGULASE NEGATIVE)   Final    Studies/Results: No results found.  Assessment/Plan: Bilateral hydronephrosis.  ARF that is improving with bilateral percutaneous nephrostomy tubes. Maintain nephrostomy tubes and discharge back to [facility] and f/u w/Dr. Gates Rigg in [place], [place] when ok'd by medicine.    LOS: 6 days   [place] [facility] 11/09/2012, 7:15 AM

## 2012-11-09 NOTE — Care Management Note (Signed)
   CARE MANAGEMENT NOTE 11/09/2012  Patient:  [name], Small   Account Number:  [id number]  Date Initiated:  11/03/2012  Documentation initiated by:  GRAVES-BIGELOW,BRENDA  Subjective/Objective Assessment:   Pt admitted with Increasing confusion.   K >6 on admit. S/p  B Percutaneous nephrostomy tube placment. Plan for SNF at d/c.     Action/Plan:   PTA pt was at [facility] ([place]CSW is working with pt and family for disposition needs. Cm will continue to monitor.   Anticipated DC Date:  11/11/2012   Anticipated DC Plan:  [place]  CM consult      Choice offered to / List presented to:             Status of service:  In process, will continue to follow Medicare Important Message given?   (If response is "NO", the following Medicare IM given date fields will be blank) Date Medicare IM given:   Date Additional Medicare IM given:    Discharge Disposition:    Per UR Regulation:  Reviewed for med. necessity/level of care/duration of stay  If discussed at [place] of Stay Meetings, dates discussed:    Comments:  11/09/2012 Per chart plan is to d/c back to [facility] for surgical removal of calcified renal stents, bilateral nephrostomy tubes draining. Per PT , pt is unsteady and would benefit from rehab. CM following for progression. Jasmine Pang RN MPH Case manager [phone]

## 2012-11-09 NOTE — Evaluation (Signed)
Occupational Therapy Evaluation Patient Details Name: Alan Small MRN: [id number] DOB: 1956/05/17 Today's Date: 11/09/2012 Time: 1125-1201 OT Time Calculation (min): 36 min  OT Assessment / Plan / Recommendation Clinical Impression  This 57 yo male admiited with confusion and found to have B hydronephrosis and labs reveal high BUN/CR presents to acute OT with Bil nephro drains. Will benefit from acute OT with follow up at SNF.    OT Assessment  Patient needs continued OT Services    Follow Up Recommendations  SNF    Barriers to Discharge Decreased caregiver support    Equipment Recommendations   (TBD at next venue)       Frequency  Min 2X/week    Precautions / Restrictions Precautions Precautions: Fall Precaution Comments: Bil nephro drains       ADL  Eating/Feeding: Simulated;Independent Where Assessed - Eating/Feeding: Chair Grooming: Performed;Wash/dry hands;Brushing hair;Min guard Where Assessed - Grooming: Unsupported standing Upper Body Bathing: Simulated;Set up;Supervision/safety Where Assessed - Upper Body Bathing: Unsupported sitting Lower Body Bathing: Simulated;Min guard Where Assessed - Lower Body Bathing: Supported sit to stand Upper Body Dressing: Simulated;Set up;Supervision/safety Where Assessed - Upper Body Dressing: Unsupported sitting Lower Body Dressing: Performed;Min guard Where Assessed - Lower Body Dressing: Supported sit to stand Toilet Transfer: Pharmacologist Method: Sit to Loss adjuster, chartered:  (Bed>sink to groom>to recliner) Toileting - Water quality scientist and Hygiene: Simulated;Min guard Where Assessed - Best boy and Hygiene: Standing Equipment Used: Gait belt (pt's SPC) Transfers/Ambulation Related to ADLs: Min guard A sit to stand and stand to sit, Min A for ambulation with SPC    OT Diagnosis: Generalized weakness;Cognitive deficits  OT Problem List: Impaired  balance (sitting and/or standing);Decreased cognition;Decreased knowledge of use of DME or AE OT Treatment Interventions: Self-care/ADL training;Balance training;Patient/family education;DME and/or AE instruction   OT Goals Acute Rehab OT Goals OT Goal Formulation: With patient Time For Goal Achievement: 11/23/12 Potential to Achieve Goals: Good ADL Goals Pt Will Perform Grooming: with set-up;with supervision;Unsupported;Standing at sink (2 tasks) ADL Goal: Grooming - Progress: Goal set today Pt Will Perform Upper Body Bathing: with set-up;with supervision;Sitting at sink;Standing at sink;Unsupported ADL Goal: Upper Body Bathing - Progress: Goal set today Pt Will Perform Lower Body Bathing: with set-up;with supervision;Unsupported;Sitting at sink;Standing at sink ADL Goal: Lower Body Bathing - Progress: Goal set today Pt Will Perform Upper Body Dressing: with set-up;with supervision;Unsupported;Sit to stand from chair;Sit to stand from bed ADL Goal: Upper Body Dressing - Progress: Goal set today Pt Will Perform Lower Body Dressing: with set-up;with supervision;Unsupported;Sit to stand from chair;Sit to stand from bed ADL Goal: Lower Body Dressing - Progress: Goal set today Pt Will Transfer to Toilet: with supervision;Ambulation;with DME;Regular height toilet;Comfort height toilet;3-in-1;Grab bars ADL Goal: Toilet Transfer - Progress: Goal set today Pt Will Perform Toileting - Clothing Manipulation: with supervision;Standing ADL Goal: Toileting - Clothing Manipulation - Progress: Goal set today Pt Will Perform Toileting - Hygiene: with supervision;Sit to stand from 3-in-1/toilet ADL Goal: Toileting - Hygiene - Progress: Goal set today  Visit Information  Last OT Received On: 11/09/12 Assistance Needed: +1       Prior Functioning     Home Living Available Help at Discharge: Available PRN/intermittently Additional Comments: Pt states he lives in a second story apt but the medical  record says he lives at a group home. Prior Function Level of Independence: Independent with assistive device(s) Driving: No Vocation: On disability Comments: Amb with straight cane and has history of falls. Communication  Communication: No difficulties Dominant Hand: Right         Vision/Perception Vision - History Baseline Vision: Wears glasses all the time Patient Visual Report: No change from baseline   Cognition  Cognition Arousal/Alertness: Awake/alert Behavior During Therapy: WFL for tasks assessed/performed Overall Cognitive Status: History of cognitive impairments - at baseline    Extremity/Trunk Assessment Right Upper Extremity Assessment RUE ROM/Strength/Tone: Within functional levels Left Upper Extremity Assessment LUE ROM/Strength/Tone: Within functional levels Right Lower Extremity Assessment RLE ROM/Strength/Tone: Deficits RLE ROM/Strength/Tone Deficits: grossly 4/5 Left Lower Extremity Assessment LLE ROM/Strength/Tone: Deficits LLE ROM/Strength/Tone Deficits: grossly 4/5     Mobility Bed Mobility Bed Mobility: Supine to Sit;Sitting - Scoot to Edge of Bed Supine to Sit: 6: Modified independent (Device/Increase time);With rails;HOB elevated Sitting - Scoot to Edge of Bed: 6: Modified independent (Device/Increase time);With rail Sit to Supine: 7: Independent;HOB flat Transfers Transfers: Sit to Stand;Stand to Sit Sit to Stand: 4: Min guard;With upper extremity assist;From bed Stand to Sit: 4: Min guard;With upper extremity assist;With armrests;To chair/3-in-1        Balance Balance Balance Assessed: Yes Static Standing Balance Static Standing - Balance Support: Right upper extremity supported Static Standing - Level of Assistance: 4: Min assist   End of Session OT - End of Session Equipment Utilized During Treatment: Gait belt Activity Tolerance: Patient tolerated treatment well Patient left: in chair;with call bell/phone within reach;with chair  alarm set Nurse Communication:  (NT: chair alarm on)       [name] [id number] 11/09/2012, 12:16 PM

## 2012-11-10 LAB — RENAL FUNCTION PANEL
CO2: 16 mEq/L — ABNORMAL LOW (ref 19–32)
Calcium: 8.6 mg/dL (ref 8.4–10.5)
Chloride: 112 mEq/L (ref 96–112)
GFR calc Af Amer: 26 mL/min — ABNORMAL LOW (ref >90)
GFR calc non Af Amer: 22 mL/min — ABNORMAL LOW (ref >90)
Glucose, Bld: 90 mg/dL (ref 70–99)
Potassium: 4.4 mEq/L (ref 3.5–5.1)
Sodium: 141 mEq/L (ref 135–145)

## 2012-11-10 MED ORDER — BOOST / RESOURCE BREEZE PO LIQD
1.0000 | Freq: Two times a day (BID) | ORAL | Status: DC
  Administered 2012-11-10 – 2012-11-11 (×3): 1 via ORAL

## 2012-11-10 NOTE — Progress Notes (Signed)
[facility] Progress Note   Alan Small [id number] DOB: 04-Nov-1955 DOA: 11/03/2012 PCP: No primary provider on file.  HPI/Subjective: Awake alert no apparent distress currently.  States appetite is better and he is tolerating diet. He is passing urine mainly from his urostomy tubes left greater than right poor scant urine output from his urethra.  Denies chills fever nausea vomiting or shortness of  Brief narrative: 57 year old male patient with history of schizophrenia and hypothyroidism. Was found to be increasingly confused at his primary residence/? Group home. He was also covered in feces. He was initially taken to the emergency department at [facility]. At that time laboratory data revealed markedly elevated serum creatinine of 10 with his baseline being 2.8 within the past 2 months. CT abdomen and pelvis revealed bilateral hydronephrosis with obstruction. Patient has an apparent history of hydronephrosis requiring stent placement at [facility]. Apparently the patient stands became calcified and were unable to be removed there were also obstructed based on most recent diagnostic imaging. He had been evaluated by a urologist at [facility] who discussed the case with the urologist at [facility] Dr. Era Bumpers. The patient was subsequently transferred to [facility] for placement of percutaneous nephrostomy tubes. In addition to the above abnormalities urinalysis was consistent with probable UTI and followup CT of the abdomen and pelvis ER revealed left-sided perinephric stranding. In addition laboratory data from the previous facility revealed hyperkalemia and metabolic acidosis an EKG was concerning for T-wave peaking consistent with hyperkalemia so the patient was given IV calcium gluconate.  Assessment/Plan:  Acute Renal Failure due to obstructive uropathy -Secondary to bilateral hydronephrosis (per CT scan from [place]).   - s/p bilateral percutaneous nephrostomy tubes by IR 11/03/12 -Cr ~11 at admit and has trended down into 2 range -May need eventual renal eval but currently making excellent UOP. -The left side PCN tube is draining significantly more than the right side PCN tube -CT of abdomen and pelvis showed resolution of right-sided hydronephrosis, there is still left-sided hydronephrosis. -Continue antibiotics, basic metabolic panel trending downwards  Bilateral Hydronephrosis   -2/2 stones/? calcified ureteral stents ?.  -s/p percutaneous nephrostomy 11/03/12  -Urology has seen -will definitely require urology FU [?Dr. Dian Situ in [facility]?] after dc to internalize stents vs other procedure especially given presentation and prior history -Dr. Era Bumpers of the urology recommended return to [facility] to remove the calcified ureteral stents.  Acute Encephalopathy  -Presumed uremic -awake and less confused today - seems at baseline  Hyperkalemia/hypernatremia/metabolic acidosis -2/2 ARF-K+ stable and now normalized (received Kayexalate this admit) -Na+ increased  (6/5) so decreased Bicarb to 1 amp and changed to D5W for base fluid On sodium bicarbonate tablets to 2 tablets 3 times daily. -EKG without acute changes.  -follow labs  Sepsis 2/2 Pyelonephritis  -Urine culture showed enterococcus and coagulase negative staph, patient is on Zosyn. -Methicillin sensitive coagulase-negative staph, amoxicillin sensitive enterococcus. -Change zosyn to amoxicillin and complete 2 week course.  Leukocytosis  -2/2 pyelonephritis.  -Change zosyn to amoxicillin 11/10/38  Hypothyroidism Continue home dose Synthroid.    DVT prophylaxis: SCDs Code Status: Full Family Communication: None present Disposition Plan: Remain as inpatient Isolation: None Nutritional Status: Suspected acute protein calorie malnutrition related to acute uremia and altered mentation  Consultants: Interventional  radiology Urology  Procedures: Insertion of bilateral percutaneous nephrostomy tubes interventional radiology 6/4  Antibiotics: Zosyn 6/3 >>> 11/09/12 Amoxicillin 11/09/12>>>to 6/17     Objective: Blood pressure 123/77, pulse 78, temperature  98.3 F (36.8 C), temperature source Oral, resp. rate 16, height 6\' 1"  (1.854 m), weight 67.813 kg (149 lb 8 oz), SpO2 98.00%.  Intake/Output Summary (Last 24 hours) at 11/10/12 1103 Last data filed at 11/10/12 1000  Gross per 24 hour  Intake 2822.25 ml  Output   4800 ml  Net -1977.75 ml     Exam: General: No acute respiratory distress Lungs: Clear to auscultation bilaterally without wheezes or crackles, RA Cardiovascular: Regular rate and rhythm without murmur gallop or rub normal S1 and S2, no peripheral edema or JVD Abdomen: Nontender, nondistended, soft, bowel sounds positive, no rebound, no ascites, no appreciable mass Genitourinary: Bilateral percutaneous nephrostomy tubes in place with moderate amount of blood-tinged urine without purulence Musculoskeletal: No significant cyanosis, clubbing of bilateral lower extremities  Scheduled Meds: Scheduled Meds: . amoxicillin  500 mg Oral Q12H  . benztropine  2 mg Oral BID  . feeding supplement  1 Container Oral BID BM  . FLUoxetine  40 mg Oral Daily  . levothyroxine  50 mcg Oral QAC breakfast  . pantoprazole  40 mg Oral Daily  . polyethylene glycol  17 g Oral Daily  . risperiDONE  3 mg Oral QHS  . sodium bicarbonate  1,300 mg Oral TID   Continuous Infusions: . sodium chloride 75 mL/hr at 11/10/12 0634  . albuterol 10 mg/hr (11/03/12 0450)    Data Reviewed: Basic Metabolic Panel:  Recent Labs Lab 11/06/12 0432 11/07/12 0405 11/07/12 1226 11/07/12 1450 11/08/12 0535 11/09/12 0520 11/10/12 0530  NA 136 138  --   --  136 142 141  K 3.5 4.4  --   --  4.4 4.6 4.4  CL 98 106  --   --  109 116* 112  CO2 24 21  --   --  16* 16* 16*  GLUCOSE 86 87  --   --  89 84 90  BUN 76*  64*  --   --  53* 42* 38*  CREATININE 4.70* 4.13* 3.44* 3.44* 3.44* 3.02* 2.89*  CALCIUM 8.1* 8.4  --   --  8.6 8.2* 8.6  PHOS 4.4 4.1  --   --  4.1 4.0 4.2   Liver Function Tests:  Recent Labs Lab 11/06/12 0432 11/07/12 0405 11/08/12 0535 11/09/12 0520 11/10/12 0530  ALBUMIN 2.0* 2.2* 2.0* 1.8* 2.1*   No results found for this basename: LIPASE, AMYLASE,  in the last 168 hours No results found for this basename: AMMONIA,  in the last 168 hours CBC:  Recent Labs Lab 11/04/12 0440 11/05/12 0510 11/06/12 0432 11/07/12 0405 11/08/12 0500  WBC 15.7* 12.2* 10.8* 11.4* 12.2*  HGB 10.3* 9.9* 10.4* 10.3* 9.7*  HCT 30.2* 28.7* 30.3* 31.0* 28.9*  MCV 82.1 81.3 81.5 82.9 81.9  PLT 221 206 188 222 210   Cardiac Enzymes: No results found for this basename: CKTOTAL, CKMB, CKMBINDEX, TROPONINI,  in the last 168 hours BNP (last 3 results) No results found for this basename: PROBNP,  in the last 8760 hours CBG:  Recent Labs Lab 11/03/12 2325 11/04/12 0330 11/04/12 0813 11/04/12 2137 11/05/12 0415  GLUCAP 95 88 78 91 93    Recent Results (from the past 240 hour(s))  CULTURE, BLOOD (ROUTINE X 2)     Status: None   Collection Time    11/03/12  1:45 AM      Result Value Range Status   Specimen Description BLOOD LEFT ARM   Final   Special Requests     Final  Value: BOTTLES DRAWN AEROBIC AND ANAEROBIC 6CC,PT ON ROCEPHIN   Culture  Setup Time 11/03/2012 10:47   Final   Culture NO GROWTH 5 DAYS   Final   Report Status 11/09/2012 FINAL   Final  CULTURE, BLOOD (ROUTINE X 2)     Status: None   Collection Time    11/03/12  1:50 AM      Result Value Range Status   Specimen Description BLOOD RIGHT ARM   Final   Special Requests BOTTLES DRAWN AEROBIC ONLY 3CC,PT ON ROCEPHIN   Final   Culture  Setup Time 11/03/2012 10:48   Final   Culture NO GROWTH 5 DAYS   Final   Report Status 11/09/2012 FINAL   Final  MRSA PCR SCREENING     Status: None   Collection Time    11/03/12  2:29 AM       Result Value Range Status   MRSA by PCR NEGATIVE  NEGATIVE Final   Comment:            The GeneXpert MRSA Assay (FDA     approved for NASAL specimens     only), is one component of a     comprehensive MRSA colonization     surveillance program. It is not     intended to diagnose MRSA     infection nor to guide or     monitor treatment for     MRSA infections.  URINE CULTURE     Status: None   Collection Time    11/03/12  2:02 PM      Result Value Range Status   Specimen Description URINE, CLEAN CATCH   Final   Special Requests NONE   Final   Culture  Setup Time 11/03/2012 22:56   Final   Colony Count >=100,000 COLONIES/ML   Final   Culture     Final   Value: ENTEROCOCCUS SPECIES     STAPHYLOCOCCUS SPECIES (COAGULASE NEGATIVE)     Note: RIFAMPIN AND GENTAMICIN SHOULD NOT BE USED AS SINGLE DRUGS FOR TREATMENT OF STAPH INFECTIONS.   Report Status 11/06/2012 FINAL   Final   Organism ID, Bacteria ENTEROCOCCUS SPECIES   Final   Organism ID, Bacteria STAPHYLOCOCCUS SPECIES (COAGULASE NEGATIVE)   Final     **If unable to reach the above provider after paging please contact the Flow Manager @ [phone]  Sharen Counter [id number] 11/10/2012, 11:03 AM

## 2012-11-10 NOTE — Progress Notes (Signed)
Occupational Therapy Treatment Patient Details Name: Alan Small MRN: [id number] DOB: March 05, 1956 Today's Date: 11/10/2012 Time: [id number] OT Time Calculation (min): 25 min  OT Assessment / Plan / Recommendation Comments on Treatment Session  Pt did well with OT this visit    Follow Up Recommendations  SNF             Frequency Min 2X/week   Plan Discharge plan remains appropriate    Precautions / Restrictions Precautions Precautions: Fall Precaution Comments: Bil nephro drains Restrictions Weight Bearing Restrictions: No       ADL  Grooming: Performed;Wash/dry hands;Wash/dry face;Teeth care;Min guard Where Assessed - Grooming: Unsupported sitting Toilet Transfer: Performed;Other (comment);Minimal assistance (bed to chair) Toilet Transfer Method: Sit to stand Toileting - Clothing Manipulation and Hygiene: Performed;Minimal assistance Where Assessed - Toileting Clothing Manipulation and Hygiene: Standing Transfers/Ambulation Related to ADLs: Verbal cues for hand placement and safety.       OT Goals ADL Goals ADL Goal: Grooming - Progress: Progressing toward goals ADL Goal: Toilet Transfer - Progress: Progressing toward goals ADL Goal: Toileting - Clothing Manipulation - Progress: Progressing toward goals ADL Goal: Toileting - Hygiene - Progress: Progressing toward goals  Visit Information  Last OT Received On: 11/10/12          Cognition  Cognition Arousal/Alertness: Awake/alert Behavior During Therapy: [facility] for tasks assessed/performed Overall Cognitive Status: History of cognitive impairments - at baseline    Mobility  Bed Mobility Supine to Sit: 5: Supervision;HOB flat;With rails Sitting - Scoot to Edge of Bed: 5: Supervision;With rail Transfers Transfers: Sit to Stand;Stand to Sit Sit to Stand: 4: Min assist;With upper extremity assist Stand to Sit: 4: Min assist;With upper extremity assist          End of Session OT - End of Session Activity  Tolerance: Patient tolerated treatment well Patient left: in chair;with call bell/phone within reach;with chair alarm set  [place], Thereasa Parkin 11/10/2012, 10:16 AM

## 2012-11-10 NOTE — Progress Notes (Signed)
  Subjective: B PCNs placed 6/4 Great output from left Less from rt Pt better daily  Objective: Vital signs in last 24 hours: Temp:  [97.3 F (36.3 C)-98.6 F (37 C)] 98 F (36.7 C) (06/11 0522) Pulse Rate:  [76-88] 87 (06/11 0522) Resp:  [14-18] 14 (06/11 0522) BP: (109-124)/(64-75) 121/73 mmHg (06/11 0522) SpO2:  [94 %-100 %] 94 % (06/11 0522) Weight:  [149 lb 8 oz (67.813 kg)] 149 lb 8 oz (67.813 kg) (06/10 1900) Last BM Date: 11/10/12  Intake/Output from previous day: 06/10 0701 - 06/11 0700 In: 3302.3 [P.O.:1876; I.V.:1416.3] Out: 4750 [Urine:4750] Intake/Output this shift: Total I/O In: -  Out: 350 [Urine:350]  PE:  B PCNs intact Draining well Bun/Cr continue to decrease   Lab Results:   Recent Labs  11/08/12 0500  WBC 12.2*  HGB 9.7*  HCT 28.9*  PLT 210   BMET  Recent Labs  11/09/12 0520 11/10/12 0530  NA 142 141  K 4.6 4.4  CL 116* 112  CO2 16* 16*  GLUCOSE 84 90  BUN 42* 38*  CREATININE 3.02* 2.89*  CALCIUM 8.2* 8.6   PT/INR No results found for this basename: LABPROT, INR,  in the last 72 hours ABG No results found for this basename: PHART, PCO2, PO2, HCO3,  in the last 72 hours  Studies/Results: No results found.  Anti-infectives: Anti-infectives   Start     Dose/Rate Route Frequency Ordered Stop   11/09/12 1200  amoxicillin (AMOXIL) capsule 500 mg     500 mg Oral Every 12 hours 11/09/12 1107 11/17/12 0959   11/03/12 1400  piperacillin-tazobactam (ZOSYN) IVPB 2.25 g  Status:  Discontinued     2.25 g 100 mL/hr over 30 Minutes Intravenous 3 times per day 11/03/12 0557 11/09/12 1107   11/03/12 0330  piperacillin-tazobactam (ZOSYN) IVPB 2.25 g     2.25 g 100 mL/hr over 30 Minutes Intravenous  Once 11/03/12 0314 11/03/12 0450      Assessment/Plan: s/p * No surgery found *  PCNs in place Doing well Renal fxn improving Plan per MD  LOS: 7 days    Alan Small A 11/10/2012

## 2012-11-10 NOTE — Progress Notes (Signed)
NUTRITION FOLLOW UP  Intervention:    Decrease Resource Breeze po BID, each supplement provides 250 kcal and 9 grams of protein.  RD to continue to follow nutrition care plan.  Nutrition Dx:   Malnutrition related to suspected inadequate intake as evidenced by severe depletion of muscle mass and subcutaneous fat. Ongoing.  Goal:   Intake to meet >90% of estimated nutrition needs. Improving.  Monitor:   PO intake, labs, weight trends, supplement tolerance  Assessment:   Pt with enterococcus UTI with bilateral calcified ureteral stents and acute on chronic renal failure. Pt has bilateral percutaneous nephrostomy tubes. Team recommends pt return to [facility] for urologic procedures to remove calcified ureteral stents.  Eating 100% of Regular meals presently. Potassium and phosphorus WNL.  Patient with severe malnutrition in the context of chronic illness, given severe depletion of muscle mass and subcutaneous fat.   Height: Ht Readings from Last 1 Encounters:  11/05/12 6\' 1"  (1.854 m)    Weight Status:   Wt Readings from Last 1 Encounters:  11/09/12 149 lb 8 oz (67.813 kg)  Admit wt was 145 lb; stable.  Re-estimated needs:  Kcal: 2000-2200  Protein: 85-100 gm  Fluid: 2-2.2 L  Skin: surgical incisions on back; left heel abrasion; left hand laceration  Diet Order: General   Intake/Output Summary (Last 24 hours) at 11/10/12 0934 Last data filed at 11/10/12 0800  Gross per 24 hour  Intake 2822.25 ml  Output   4500 ml  Net -1677.75 ml    Last BM: 6/11   Labs:   Recent Labs Lab 11/08/12 0535 11/09/12 0520 11/10/12 0530  NA 136 142 141  K 4.4 4.6 4.4  CL 109 116* 112  CO2 16* 16* 16*  BUN 53* 42* 38*  CREATININE 3.44* 3.02* 2.89*  CALCIUM 8.6 8.2* 8.6  PHOS 4.1 4.0 4.2  GLUCOSE 89 84 90    CBG (last 3)  No results found for this basename: GLUCAP,  in the last 72 hours  Scheduled Meds: . amoxicillin  500 mg Oral Q12H  . benztropine  2 mg Oral BID  .  feeding supplement  1 Container Oral TID BM  . FLUoxetine  40 mg Oral Daily  . levothyroxine  50 mcg Oral QAC breakfast  . pantoprazole  40 mg Oral Daily  . polyethylene glycol  17 g Oral Daily  . risperiDONE  3 mg Oral QHS  . sodium bicarbonate  1,300 mg Oral TID    Continuous Infusions: . sodium chloride 75 mL/hr at 11/10/12 0634  . albuterol 10 mg/hr (11/03/12 0450)    Inda Coke MS, RD, LDN Pager: [phone] After-hours pager: [phone]

## 2012-11-10 NOTE — Progress Notes (Signed)
CSW received a call from pt DSS worker and pt prior supervisor at [facility] regarding discharge plans. CSW informed both that pt has since transferred to [id number] where the unit CSW is assisting with placemen however dc plans have remained for SNF placement. DSS worker and Retail banker of update. CSW notified unit CSW.  Hunt Oris, MSW, [place]

## 2012-11-11 LAB — RENAL FUNCTION PANEL
Albumin: 2.1 g/dL — ABNORMAL LOW (ref 3.5–5.2)
Chloride: 112 mEq/L (ref 96–112)
Creatinine, Ser: 2.84 mg/dL — ABNORMAL HIGH (ref 0.50–1.35)
GFR calc Af Amer: 27 mL/min — ABNORMAL LOW (ref >90)
GFR calc non Af Amer: 23 mL/min — ABNORMAL LOW (ref >90)
Potassium: 5 mEq/L (ref 3.5–5.1)
Sodium: 141 mEq/L (ref 135–145)

## 2012-11-11 MED ORDER — AMOXICILLIN 500 MG PO CAPS: 500.0000 mg | ORAL_CAPSULE | Freq: Two times a day (BID) | ORAL | Status: DC

## 2012-11-11 MED ORDER — SODIUM BICARBONATE 650 MG PO TABS: 1300.0000 mg | ORAL_TABLET | Freq: Two times a day (BID) | ORAL | Status: DC

## 2012-11-11 NOTE — Progress Notes (Signed)
Physical Therapy Treatment Patient Details Name: Alan Small MRN: [id number] DOB: 26-Mar-1956 Today's Date: 11/11/2012 Time: 1421-1450 PT Time Calculation (min): 29 min  PT Assessment / Plan / Recommendation Comments on Treatment Session  Pt appears to be improving in mobility, but has difficulty with 2 nephrostomy tubes, foley and IV.  He needed to use bedside commode for BM and had some difficulty with hygiene. He continues to have difficulties with balance in gait    Follow Up Recommendations  SNF     Does the patient have the potential to tolerate intense rehabilitation     Barriers to Discharge        Equipment Recommendations  None recommended by PT    Recommendations for Other Services    Frequency Min 3X/week   Plan Discharge plan remains appropriate;Frequency remains appropriate    Precautions / Restrictions Precautions Precautions: Fall Precaution Comments: Bil nephro drains Restrictions Weight Bearing Restrictions: No   Pertinent Vitals/Pain No c/o pain    Mobility  Bed Mobility Supine to Sit: 5: Supervision;HOB flat;With rails Sitting - Scoot to Edge of Bed: 5: Supervision;With rail Transfers Sit to Stand: 5: Supervision;4: Min assist;With upper extremity assist Stand to Sit: 5: Supervision;4: Min assist;With upper extremity assist Ambulation/Gait Ambulation/Gait Assistance: 5: Supervision Ambulation Distance (Feet): 250 Feet Assistive device: Straight cane (and IV pole in other hand) Ambulation/Gait Assistance Details: supervision for balance Gait Pattern: Narrow base of support;Ataxic;Step-through pattern;Decreased step length - right;Trunk flexed Gait velocity: decr General Gait Details: Pt needs supervision for safety and has difficulty with advance commands Stairs: No Wheelchair Mobility Wheelchair Mobility: No    Exercises     PT Diagnosis:    PT Problem List:   PT Treatment Interventions:     PT Goals Acute Rehab PT Goals PT Goal  Formulation: With patient Time For Goal Achievement: 11/16/12 Potential to Achieve Goals: Good Pt will go Sit to Stand: with modified independence PT Goal: Sit to Stand - Progress: Progressing toward goal Pt will go Stand to Sit: with modified independence PT Goal: Stand to Sit - Progress: Progressing toward goal Pt will Ambulate: 51 - 150 feet;with modified independence;with least restrictive assistive device PT Goal: Ambulate - Progress: Progressing toward goal  Visit Information  Last PT Received On: 11/11/12 Assistance Needed: +1    Subjective Data  Subjective: pt wants to use cane and IV pole Patient Stated Goal: walk   Cognition  Cognition Arousal/Alertness: Awake/alert Behavior During Therapy: WFL for tasks assessed/performed Overall Cognitive Status: History of cognitive impairments - at baseline    Balance  Balance Balance Assessed: Yes Static Standing Balance Static Standing - Balance Support: Right upper extremity supported;No upper extremity supported Static Standing - Level of Assistance: 4: Min assist;7: Independent  End of Session PT - End of Session Equipment Utilized During Treatment: Gait belt Activity Tolerance: Patient tolerated treatment well Patient left: in bed;with call bell/phone within reach;with bed alarm set Nurse Communication: Mobility status   GP    Donato Heinz. [place], [place] 11/11/2012, 4:56 PM

## 2012-11-11 NOTE — Progress Notes (Signed)
Report called to [facility] rehab at [phone], given to [place].

## 2012-11-11 NOTE — Progress Notes (Signed)
[facility] arrived to transport pt. Room checked for belongings, IV removed, nephrostomy bags emptied.

## 2012-11-11 NOTE — Discharge Summary (Signed)
Physician Discharge Summary  YOLTZIN LAHNER [id number] DOB: Sep 17, 1955 DOA: 11/03/2012  PCP: No primary provider on file.  Admit date: 11/03/2012 Discharge date: 11/11/2012  Time spent: 40 minutes  Recommendations for Outpatient Follow-up:  1. Needs renal panel in 2-3 days and discussion of continuing sodium bicarb as outpatient  2. Needs definitive Rx of his Urological concerns [internalization of stents/laser etc.]  Discussed with [facility] on-call Dr. Mitzie Na of Urology and appt set-up for for june 26th at 11:45 am 3. Finish course of Amoxicillin for Enterococcal +Staphylococcal Pyelonephritis on 11/16/2012   Discharge Diagnoses:  Principal Problem:   Acute renal failure Active Problems:   Hyperkalemia   Pyelonephritis   Schizophrenia   Hypothyroidism   Severe sepsis   Protein-calorie malnutrition, severe   Encephalopathy acute   Metabolic acidosis   Discharge Condition: good  Diet recommendation: regular  Filed Weights   11/08/12 2051 11/09/12 1900 11/10/12 2009  Weight: 66.2 kg (145 lb 15.1 oz) 67.813 kg (149 lb 8 oz) 67.813 kg (149 lb 8 oz)    History of present illness:  57 year old male patient with history of schizophrenia and hypothyroidism. Was found to be increasingly confused at his primary residence/? Group home. He was also covered in feces. He was initially taken to the emergency department at [facility]. At that time laboratory data revealed markedly elevated serum creatinine of 10 with his baseline being 2.8 within the past 2 months. CT abdomen and pelvis revealed bilateral hydronephrosis with obstruction. Patient has history of hydronephrosis requiring stent placement at [facility]. Apparently stents became calcified and were unable to be removed.  They were also obstructed based on most recent diagnostic imaging. He had been evaluated by a urologist at [facility] who discussed the case with the  urologist at [facility] Era Bumpers. The patient was subsequently transferred to [facility] for placement of percutaneous nephrostomy tubes. In addition to the above abnormalities urinalysis was consistent with probable UTI and followup CT of the abdomen and pelvis ER revealed left-sided perinephric stranding. In addition laboratory data from the previous facility revealed hyperkalemia and metabolic acidosis an EKG was concerning for T-wave peaking consistent with hyperkalemia so the patient was given IV calcium gluconate. Please see below for the rest of Hospital details   Hospital Course:  Acute Renal Failure due to obstructive uropathy  -Secondary to bilateral hydronephrosis (per CT scan from [place]).  - s/p bilateral percutaneous nephrostomy tubes by IR 11/03/12  -Cr ~11 at admit and has trended down into 2 range  -May need eventual renal eval but currently making excellent UOP.  -The left side PCN tube is draining significantly more than the right side PCN tube  -CT of abdomen and pelvis showed resolution of right-sided hydronephrosis, there is still left-sided hydronephrosis.  -Continue antibiotics, basic metabolic panel trending downwards   Bilateral Hydronephrosis  -2/2 stones/? calcified ureteral stents ?.  -s/p percutaneous nephrostomy 11/03/12  -Urology has seen  -will definitely require urology FU [Dr.Cope in [facility]] after d/c to internalize stents vs other procedure especially given presentation and prior history  -Dr. Era Bumpers of the urology recommended return to [facility] to remove the calcified ureteral stents.   Acute Encephalopathy  -Presumed uremic  -awake and less confused today - seems at baseline   Hyperkalemia/hypernatremia/metabolic acidosis 2/2 to Acute renal failure from Ureteric obstruction -2/2 ARF-K+ stable and now normalized (received Kayexalate this admit)  -Na+ increased (6/5) so decreased Bicarb to 1 amp and changed  to D5W for base fluid  On sodium bicarbonate  tablets to 2 tablets 2 times daily.  -EKG without acute changes.   Sepsis 2/2 Pyelonephritis  -Urine culture showed enterococcus and coagulase negative staph, patient is on Zosyn.  -Methicillin sensitive coagulase-negative staph, amoxicillin sensitive enterococcus.  -Change zosyn to amoxicillin and complete 2 week course.   Leukocytosis  -2/2 pyelonephritis.  -Change zosyn to amoxicillin 11/10/38   Hypothyroidism  -Continue home dose Synthroid 50 mcg daily  Schizophrenia -stable and at baseline -continue prozac 40 daily, Respiridone 3 mg daily, Cognetin 2 mg bid, Fluphenazine 5mg  q 2 weekly Injection   Procedures:  Percutaneous Urostomy tubes on 11/04/11  Consultations:  Urology Dr. Gaynelle Arabian   Discharge Exam: Filed Vitals:   11/10/12 1344 11/10/12 1744 11/10/12 2009 11/11/12 0350  BP: 119/74 110/80 122/81 121/77  Pulse: 84 74 85 85  Temp: 98.7 F (37.1 C) 97.6 F (36.4 C) 98.1 F (36.7 C) 98.7 F (37.1 C)  TempSrc: Oral Oral Oral Oral  Resp: 17 16 16 16   Height:      Weight:   67.813 kg (149 lb 8 oz)   SpO2: 99% 100% 95% 99%   slightly sleepy, doing fair otherwise.  Passed uneventful night No c/o General: eomi, ncat Cardiovascular:  s1 s2 no m/r/g Respiratory: clear  Discharge Instructions  Discharge Orders   Future Orders Complete By Expires     Diet - low sodium heart healthy  As directed     Discharge instructions  As directed     Comments:      You will need lab work perfomred in 1 week Dr. Jacqlyn Larsen will need to see you in about 1-2 weeks to remove nephrostomy tubes, and change the stents  You were cared for by a hospitalist during your hospital stay. If you have any questions about your discharge medications or the care you received while you were in the hospital after you are discharged, you can call the unit and asked to speak with the hospitalist on call if the hospitalist that took care of you is not available. Once you are discharged, your primary  care physician will handle any further medical issues. Please note that NO REFILLS for any discharge medications will be authorized once you are discharged, as it is imperative that you return to your primary care physician (or establish a relationship with a primary care physician if you do not have one) for your aftercare needs so that they can reassess your need for medications and monitor your lab values. If you do not have a primary care physician, you can call [phone] for a physician referral.    Increase activity slowly  As directed         Medication List    TAKE these medications       amoxicillin 500 MG capsule  Commonly known as:  AMOXIL  Take 1 capsule (500 mg total) by mouth every 12 (twelve) hours.     benztropine 2 MG tablet  Commonly known as:  COGENTIN  Take 2 mg by mouth 2 (two) times daily.     COMBIVENT 18-103 MCG/ACT inhaler  Generic drug:  albuterol-ipratropium  Inhale 2 puffs into the lungs every 6 (six) hours as needed for wheezing.     diphenhydrAMINE 50 MG capsule  Commonly known as:  BENADRYL  Take 50 mg by mouth at bedtime.     FLUoxetine 40 MG capsule  Commonly known as:  PROZAC  Take 40 mg by mouth  daily.     fluPHENAZine decanoate 25 MG/ML injection  Commonly known as:  PROLIXIN  Inject 5 mg into the muscle every 14 (fourteen) days. Due 11/08/12     levothyroxine 50 MCG tablet  Commonly known as:  SYNTHROID, LEVOTHROID  Take 50 mcg by mouth daily before breakfast.     risperiDONE 3 MG tablet  Commonly known as:  RISPERDAL  Take 3 mg by mouth at bedtime.     senna 8.6 MG Tabs  Commonly known as:  SENOKOT  Take 1 tablet by mouth 2 (two) times daily as needed (constipation).     sodium bicarbonate 650 MG tablet  Take 2 tablets (1,300 mg total) by mouth 2 (two) times daily.       Allergies  Allergen Reactions  . Mellaril (Thioridazine) Other (See Comments)    Unknown   . Morphine And Related Itching  . Navane (Thiothixene) Other (See  Comments)    GI Distress  . Thorazine (Chlorpromazine) Other (See Comments)    Unknown      The results of significant diagnostics from this hospitalization (including imaging, microbiology, ancillary and laboratory) are listed below for reference.    Significant Diagnostic Studies: Ct Abdomen Pelvis Wo Contrast  11/06/2012   *RADIOLOGY REPORT*  Clinical Data: Bilateral nephrostomy tubes placed.  Left side draining significantly more than the right side.  CT ABDOMEN AND PELVIS WITHOUT CONTRAST  Technique:  Multidetector CT imaging of the abdomen and pelvis was performed following the standard protocol without intravenous contrast.  Comparison: No priors.  Findings:  Lung Bases: A small left pleural effusion.  Dependent atelectasis in the lower lobes of the lungs bilaterally.  Abdomen/Pelvis:  Bilateral percutaneous nephrostomy tubes are in position.  The right tube appears properly located within the right renal pelvis.  There is some high-density material within the right renal pelvis measuring approximately 2.2 cm compatible with a large calculus.  A small nonobstructive calculi are also noted in the lower pole of the right kidney, largest of which is 10 mm in diameter.  There is a small locule of gas in the right renal collecting system, iatrogenic.  No right hydronephrosis.  Small right-sided perinephric stranding. The right kidney is mildly atrophic.  The left nephrostomy tube extends into the lower pole collecting system of the left kidney and is slightly peripherally located.  Tiny nonobstructive calculus measuring only 3 mm in the interpolar collecting system of the left kidney.  There is severe left-sided hydroureteronephrosis extensive left-sided perinephric stranding.  Coronal images suggest probable forniceal rupture in the upper pole (image 51 of series 5).  The hydroureteronephrosis abruptly terminates in the mid left ureter where there is a heavily calcified retained left ureteral stent  which has migrated into the mid and distal left ureter, extending into the pelvis.  The ureteral portion of the stent is surrounded by a large amount of high attenuation material, likely to be completely encrusted with calculus.  Numerous low attenuation hepatic lesions are seen scattered throughout the hepatic parenchyma.  The largest of these is in segment 1 measuring 2.5 x 2.1 cm.  These are incompletely characterized on this noncontrast CT examination, but favored to represent cysts.  The unenhanced appearance of the gallbladder, pancreas, spleen and bilateral adrenal glands is unremarkable.  No significant volume of ascites.  No pneumoperitoneum.  No pathologic distension of small bowel.  A significant atherosclerosis is noted throughout the abdominal and pelvic vasculature, without definite aneurysm.  The numerous calcifications in the prostate  gland. Foley balloon catheter in place within the lumen of the urinary bladder.  Musculoskeletal: There are no aggressive appearing lytic or blastic lesions noted in the visualized portions of the skeleton.  IMPRESSION: 1.  The patient's old left double-J ureteral stent has partially migrated into the pelvis, occupying only the distal half of the left ureter.  The remaining portion of the stent within the distal half of the left ureter is heavily encrusted with calculus, resulting in severe left ureteral obstruction, with severe left- sided hydroureteronephrosis and extensive left-sided perinephric stranding, likely related to upper pole forniceal rupture.  The left sided nephrostomy tube appears slightly peripherally located in the lower pole collecting system of the left kidney.  Clinical correlation for proper function of the left nephrostomy tube is suggested, as this degree of obstruction is unusual in the presence of a properly functioning nephrostomy tube. 2.  Right-sided nephrostomy tube appears properly located.  No right sided hydroureteronephrosis. 3.   Nonobstructive calculi within the collecting systems of the kidneys bilaterally. 4.  Small left pleural effusion with minimal bibasilar subsegmental atelectasis in the lower lobes of the lungs bilaterally. 5.  Multiple low-attenuation hepatic lesions incompletely characterized on today's noncontrast CT examination.  These are favored to represent cysts, and could be further characterized with contrast enhanced CT or ultrasound. 5.  Atherosclerosis.   Original Report Authenticated By: Vinnie Langton, M.D.   Ir Perc Nephrostomy Left  11/03/2012   *RADIOLOGY REPORT*  Clinical Data: Bilateral hydronephrosis, elevated white blood cell count, nonfunctioning ureteral stent.  PERC NEPHROSTOMY*L*,PERC NEPHROSTOMY*R*,IR ULTRASOUND GUIDANCE TISSUE ABLATION  Fluoroscopy Time: 54 seconds  Comparison: None available.  Technique and findings:  The procedure, risks (including but not limited to bleeding, infection, organ damage), benefits, and alternatives were explained to the family.  Questions regarding the procedure were encouraged and answered.  The family understands and consents to the procedure.Bilateralflank regions prepped with Betadine, draped in usual sterile fashion, infiltrated locally with 1% lidocaine.The patient was receiving adequate antibiotic coverage.  Intravenous Fentanyl and Versed were administered as conscious sedation during continuous cardiorespiratory monitoring by the radiology RN, with a total moderate sedation time of 12 minutes.   Under real-time ultrasound guidance, a 21-gauge trocar needle was advanced into a posterior right lower pole calyx. Ultrasound image documentation was saved. Urine spontaneously returned through the needle. Needle was exchanged over a guidewire for transitional dilator. Contrast injection confirmed appropriate positioning. Catheter was exchanged over a guidewire for a 10 French pigtail catheter, formed centrally within the right renal collecting system. Contrast  injection confirms appropriate positioning and patency.  In similar fashion, under real-time ultrasound guidance, a 21-gauge trocar needle was advanced into a posterior left lower pole calyx. Ultrasound image documentation was saved. Urine spontaneously returned through the needle. Needle was exchanged over a guidewire for transitional dilator. Contrast injection confirmed appropriate positioning. Catheter was exchanged over a guidewire for a 10 French pigtail catheter, formed centrally within the  left  renal collecting system. Contrast injection confirms appropriate positioning and patency.  Catheters   secured externally with 0 Prolene suture and Statlock devices and placed to external drain bags. No immediate complication.  IMPRESSION Technically successful bilateral percutaneous nephrostomy catheter placement.   Original Report Authenticated By: D. Wallace Going, MD   Ir Perc Nephrostomy Right  11/03/2012   *RADIOLOGY REPORT*  Clinical Data: Bilateral hydronephrosis, elevated white blood cell count, nonfunctioning ureteral stent.  PERC NEPHROSTOMY*L*,PERC NEPHROSTOMY*R*,IR ULTRASOUND GUIDANCE TISSUE ABLATION  Fluoroscopy Time: 54 seconds  Comparison: None available.  Technique and findings:  The procedure, risks (including but not limited to bleeding, infection, organ damage), benefits, and alternatives were explained to the family.  Questions regarding the procedure were encouraged and answered.  The family understands and consents to the procedure.Bilateralflank regions prepped with Betadine, draped in usual sterile fashion, infiltrated locally with 1% lidocaine.The patient was receiving adequate antibiotic coverage.  Intravenous Fentanyl and Versed were administered as conscious sedation during continuous cardiorespiratory monitoring by the radiology RN, with a total moderate sedation time of 12 minutes.   Under real-time ultrasound guidance, a 21-gauge trocar needle was advanced into a posterior right lower  pole calyx. Ultrasound image documentation was saved. Urine spontaneously returned through the needle. Needle was exchanged over a guidewire for transitional dilator. Contrast injection confirmed appropriate positioning. Catheter was exchanged over a guidewire for a 10 French pigtail catheter, formed centrally within the right renal collecting system. Contrast injection confirms appropriate positioning and patency.  In similar fashion, under real-time ultrasound guidance, a 21-gauge trocar needle was advanced into a posterior left lower pole calyx. Ultrasound image documentation was saved. Urine spontaneously returned through the needle. Needle was exchanged over a guidewire for transitional dilator. Contrast injection confirmed appropriate positioning. Catheter was exchanged over a guidewire for a 10 French pigtail catheter, formed centrally within the  left  renal collecting system. Contrast injection confirms appropriate positioning and patency.  Catheters   secured externally with 0 Prolene suture and Statlock devices and placed to external drain bags. No immediate complication.  IMPRESSION Technically successful bilateral percutaneous nephrostomy catheter placement.   Original Report Authenticated By: D. Wallace Going, MD   Ir US Guide Bx Asp/drain  11/03/2012   *RADIOLOGY REPORT*  Clinical Data: Bilateral hydronephrosis, elevated white blood cell count, nonfunctioning ureteral stent.  PERC NEPHROSTOMY*L*,PERC NEPHROSTOMY*R*,IR ULTRASOUND GUIDANCE TISSUE ABLATION  Fluoroscopy Time: 54 seconds  Comparison: None available.  Technique and findings:  The procedure, risks (including but not limited to bleeding, infection, organ damage), benefits, and alternatives were explained to the family.  Questions regarding the procedure were encouraged and answered.  The family understands and consents to the procedure.Bilateralflank regions prepped with Betadine, draped in usual sterile fashion, infiltrated locally with 1%  lidocaine.The patient was receiving adequate antibiotic coverage.  Intravenous Fentanyl and Versed were administered as conscious sedation during continuous cardiorespiratory monitoring by the radiology RN, with a total moderate sedation time of 12 minutes.   Under real-time ultrasound guidance, a 21-gauge trocar needle was advanced into a posterior right lower pole calyx. Ultrasound image documentation was saved. Urine spontaneously returned through the needle. Needle was exchanged over a guidewire for transitional dilator. Contrast injection confirmed appropriate positioning. Catheter was exchanged over a guidewire for a 10 French pigtail catheter, formed centrally within the right renal collecting system. Contrast injection confirms appropriate positioning and patency.  In similar fashion, under real-time ultrasound guidance, a 21-gauge trocar needle was advanced into a posterior left lower pole calyx. Ultrasound image documentation was saved. Urine spontaneously returned through the needle. Needle was exchanged over a guidewire for transitional dilator. Contrast injection confirmed appropriate positioning. Catheter was exchanged over a guidewire for a 10 French pigtail catheter, formed centrally within the  left  renal collecting system. Contrast injection confirms appropriate positioning and patency.  Catheters   secured externally with 0 Prolene suture and Statlock devices and placed to external drain bags. No immediate complication.  IMPRESSION Technically successful bilateral percutaneous nephrostomy catheter placement.   Original Report Authenticated By: D. Wallace Going, MD  Ir US Guide Bx Asp/drain  11/03/2012   *RADIOLOGY REPORT*  Clinical Data: Bilateral hydronephrosis, elevated white blood cell count, nonfunctioning ureteral stent.  PERC NEPHROSTOMY*L*,PERC NEPHROSTOMY*R*,IR ULTRASOUND GUIDANCE TISSUE ABLATION  Fluoroscopy Time: 54 seconds  Comparison: None available.  Technique and findings:  The  procedure, risks (including but not limited to bleeding, infection, organ damage), benefits, and alternatives were explained to the family.  Questions regarding the procedure were encouraged and answered.  The family understands and consents to the procedure.Bilateralflank regions prepped with Betadine, draped in usual sterile fashion, infiltrated locally with 1% lidocaine.The patient was receiving adequate antibiotic coverage.  Intravenous Fentanyl and Versed were administered as conscious sedation during continuous cardiorespiratory monitoring by the radiology RN, with a total moderate sedation time of 12 minutes.   Under real-time ultrasound guidance, a 21-gauge trocar needle was advanced into a posterior right lower pole calyx. Ultrasound image documentation was saved. Urine spontaneously returned through the needle. Needle was exchanged over a guidewire for transitional dilator. Contrast injection confirmed appropriate positioning. Catheter was exchanged over a guidewire for a 10 French pigtail catheter, formed centrally within the right renal collecting system. Contrast injection confirms appropriate positioning and patency.  In similar fashion, under real-time ultrasound guidance, a 21-gauge trocar needle was advanced into a posterior left lower pole calyx. Ultrasound image documentation was saved. Urine spontaneously returned through the needle. Needle was exchanged over a guidewire for transitional dilator. Contrast injection confirmed appropriate positioning. Catheter was exchanged over a guidewire for a 10 French pigtail catheter, formed centrally within the  left  renal collecting system. Contrast injection confirms appropriate positioning and patency.  Catheters   secured externally with 0 Prolene suture and Statlock devices and placed to external drain bags. No immediate complication.  IMPRESSION Technically successful bilateral percutaneous nephrostomy catheter placement.   Original Report  Authenticated By: D. Wallace Going, MD   Dg Chest Port 1 View  11/03/2012   *RADIOLOGY REPORT*  Clinical Data: Follow up infiltrates  PORTABLE CHEST - 1 VIEW  Comparison: None  Findings: The heart size appears normal.  No pleural effusion identified.  No airspace consolidation.  Mild to moderate pulmonary vascular congestion is noted bilaterally.  The visualized bony structures appear intact.  IMPRESSION:  1.  Pulmonary vascular congestion.   Original Report Authenticated By: Kerby Moors, M.D.    Microbiology: Recent Results (from the past 240 hour(s))  CULTURE, BLOOD (ROUTINE X 2)     Status: None   Collection Time    11/03/12  1:45 AM      Result Value Range Status   Specimen Description BLOOD LEFT ARM   Final   Special Requests     Final   Value: BOTTLES DRAWN AEROBIC AND ANAEROBIC 6CC,PT ON ROCEPHIN   Culture  Setup Time 11/03/2012 10:47   Final   Culture NO GROWTH 5 DAYS   Final   Report Status 11/09/2012 FINAL   Final  CULTURE, BLOOD (ROUTINE X 2)     Status: None   Collection Time    11/03/12  1:50 AM      Result Value Range Status   Specimen Description BLOOD RIGHT ARM   Final   Special Requests BOTTLES DRAWN AEROBIC ONLY 3CC,PT ON ROCEPHIN   Final   Culture  Setup Time 11/03/2012 10:48   Final   Culture NO GROWTH 5 DAYS   Final   Report Status 11/09/2012 FINAL   Final  MRSA PCR SCREENING     Status: None   Collection  Time    11/03/12  2:29 AM      Result Value Range Status   MRSA by PCR NEGATIVE  NEGATIVE Final   Comment:            The GeneXpert MRSA Assay (FDA     approved for NASAL specimens     only), is one component of a     comprehensive MRSA colonization     surveillance program. It is not     intended to diagnose MRSA     infection nor to guide or     monitor treatment for     MRSA infections.  URINE CULTURE     Status: None   Collection Time    11/03/12  2:02 PM      Result Value Range Status   Specimen Description URINE, CLEAN CATCH   Final   Special  Requests NONE   Final   Culture  Setup Time 11/03/2012 22:56   Final   Colony Count >=100,000 COLONIES/ML   Final   Culture     Final   Value: ENTEROCOCCUS SPECIES     STAPHYLOCOCCUS SPECIES (COAGULASE NEGATIVE)     Note: RIFAMPIN AND GENTAMICIN SHOULD NOT BE USED AS SINGLE DRUGS FOR TREATMENT OF STAPH INFECTIONS.   Report Status 11/06/2012 FINAL   Final   Organism ID, Bacteria ENTEROCOCCUS SPECIES   Final   Organism ID, Bacteria STAPHYLOCOCCUS SPECIES (COAGULASE NEGATIVE)   Final     Labs: Basic Metabolic Panel:  Recent Labs Lab 11/07/12 0405  11/07/12 1450 11/08/12 0535 11/09/12 0520 11/10/12 0530 11/11/12 0605  NA 138  --   --  136 142 141 141  K 4.4  --   --  4.4 4.6 4.4 5.0  CL 106  --   --  109 116* 112 112  CO2 21  --   --  16* 16* 16* 20  GLUCOSE 87  --   --  89 84 90 89  BUN 64*  --   --  53* 42* 38* 42*  CREATININE 4.13*  < > 3.44* 3.44* 3.02* 2.89* 2.84*  CALCIUM 8.4  --   --  8.6 8.2* 8.6 9.2  PHOS 4.1  --   --  4.1 4.0 4.2 4.4  < > = values in this interval not displayed. Liver Function Tests:  Recent Labs Lab 11/07/12 0405 11/08/12 0535 11/09/12 0520 11/10/12 0530 11/11/12 0605  ALBUMIN 2.2* 2.0* 1.8* 2.1* 2.1*   No results found for this basename: LIPASE, AMYLASE,  in the last 168 hours No results found for this basename: AMMONIA,  in the last 168 hours CBC:  Recent Labs Lab 11/05/12 0510 11/06/12 0432 11/07/12 0405 11/08/12 0500  WBC 12.2* 10.8* 11.4* 12.2*  HGB 9.9* 10.4* 10.3* 9.7*  HCT 28.7* 30.3* 31.0* 28.9*  MCV 81.3 81.5 82.9 81.9  PLT 206 188 222 210   Cardiac Enzymes: No results found for this basename: CKTOTAL, CKMB, CKMBINDEX, TROPONINI,  in the last 168 hours BNP: BNP (last 3 results) No results found for this basename: PROBNP,  in the last 8760 hours CBG:  Recent Labs Lab 11/04/12 0813 11/04/12 2137 11/05/12 0415  GLUCAP 78 91 93       Signed:  Amry Cathy, [place]  [facility] 11/11/2012, 7:54  AM

## 2012-11-11 NOTE — Progress Notes (Signed)
Attempt x1 to call report to [facility], no answer. Number called [phone]. Will reattempt

## 2012-11-22 NOTE — Progress Notes (Signed)
Agree with NP.  ST

## 2012-11-25 ENCOUNTER — Ambulatory Visit: Payer: Self-pay | Admitting: Urology

## 2012-12-07 ENCOUNTER — Ambulatory Visit: Payer: Self-pay | Admitting: Urology

## 2012-12-08 LAB — CBC WITH DIFFERENTIAL/PLATELET
Basophil #: 0.1 10*3/uL (ref 0.0–0.1)
Basophil %: 0.5 %
HCT: 25.3 % — ABNORMAL LOW (ref 40.0–52.0)
MCHC: 33.6 g/dL (ref 32.0–36.0)
MCV: 85 fL (ref 80–100)
Monocyte %: 10.1 %
Neutrophil #: 8 10*3/uL — ABNORMAL HIGH (ref 1.4–6.5)
Neutrophil %: 77.1 %
RBC: 2.97 10*6/uL — ABNORMAL LOW (ref 4.40–5.90)
RDW: 18.7 % — ABNORMAL HIGH (ref 11.5–14.5)
WBC: 10.4 10*3/uL (ref 3.8–10.6)

## 2012-12-08 LAB — BASIC METABOLIC PANEL
Anion Gap: 6 — ABNORMAL LOW (ref 7–16)
BUN: 48 mg/dL — ABNORMAL HIGH (ref 7–18)
Chloride: 118 mmol/L — ABNORMAL HIGH (ref 98–107)
Co2: 24 mmol/L (ref 21–32)
EGFR (African American): 25 — ABNORMAL LOW
EGFR (Non-African Amer.): 21 — ABNORMAL LOW
Glucose: 86 mg/dL (ref 65–99)
Potassium: 4.6 mmol/L (ref 3.5–5.1)

## 2012-12-08 LAB — MAGNESIUM: Magnesium: 1.8 mg/dL

## 2012-12-21 ENCOUNTER — Ambulatory Visit: Payer: Self-pay | Admitting: Urology

## 2012-12-22 LAB — BASIC METABOLIC PANEL
BUN: 40 mg/dL — ABNORMAL HIGH (ref 7–18)
Calcium, Total: 8 mg/dL — ABNORMAL LOW (ref 8.5–10.1)
Chloride: 113 mmol/L — ABNORMAL HIGH (ref 98–107)
Co2: 25 mmol/L (ref 21–32)
Creatinine: 2.66 mg/dL — ABNORMAL HIGH (ref 0.60–1.30)
EGFR (African American): 29 — ABNORMAL LOW
Glucose: 82 mg/dL (ref 65–99)

## 2012-12-22 LAB — CBC WITH DIFFERENTIAL/PLATELET
Eosinophil %: 3.6 %
HGB: 8.9 g/dL — ABNORMAL LOW (ref 13.0–18.0)
Lymphocyte #: 1.1 10*3/uL (ref 1.0–3.6)
Lymphocyte %: 9.2 %
MCV: 86 fL (ref 80–100)
Monocyte #: 0.8 x10 3/mm (ref 0.2–1.0)
Monocyte %: 6.8 %
Platelet: 184 10*3/uL (ref 150–440)
RBC: 3.08 10*6/uL — ABNORMAL LOW (ref 4.40–5.90)
RDW: 19.1 % — ABNORMAL HIGH (ref 11.5–14.5)
WBC: 12.4 10*3/uL — ABNORMAL HIGH (ref 3.8–10.6)

## 2013-01-04 ENCOUNTER — Ambulatory Visit: Payer: Self-pay | Admitting: Urology

## 2013-01-14 ENCOUNTER — Ambulatory Visit: Payer: Self-pay | Admitting: Urology

## 2013-01-14 LAB — APTT: Activated PTT: 29.7 secs (ref 23.6–35.9)

## 2013-01-14 LAB — PROTIME-INR
INR: 1
Prothrombin Time: 13 secs (ref 11.5–14.7)

## 2013-01-14 LAB — PLATELET COUNT: Platelet: 202 10*3/uL (ref 150–440)

## 2013-03-04 DIAGNOSIS — N200 Calculus of kidney: Secondary | ICD-10-CM | POA: Insufficient documentation

## 2013-03-15 DIAGNOSIS — N209 Urinary calculus, unspecified: Secondary | ICD-10-CM | POA: Insufficient documentation

## 2013-05-20 ENCOUNTER — Emergency Department: Payer: Self-pay | Admitting: Emergency Medicine

## 2013-05-20 LAB — CBC
HCT: 35.4 % — ABNORMAL LOW (ref 40.0–52.0)
MCHC: 33 g/dL (ref 32.0–36.0)
MCV: 87 fL (ref 80–100)
RBC: 4.09 10*6/uL — ABNORMAL LOW (ref 4.40–5.90)
RDW: 16.2 % — ABNORMAL HIGH (ref 11.5–14.5)
WBC: 10.5 10*3/uL (ref 3.8–10.6)

## 2013-05-20 LAB — URINALYSIS, COMPLETE
Bilirubin,UR: NEGATIVE
Glucose,UR: NEGATIVE mg/dL (ref 0–75)
Ketone: NEGATIVE
Nitrite: NEGATIVE
Ph: 7 (ref 4.5–8.0)
Protein: NEGATIVE
RBC,UR: 7 /HPF (ref 0–5)
Specific Gravity: 1.012 (ref 1.003–1.030)
Squamous Epithelial: <1
WBC UR: 47 /HPF (ref 0–5)

## 2013-05-20 LAB — COMPREHENSIVE METABOLIC PANEL WITH GFR
Albumin: 3 g/dL — ABNORMAL LOW (ref 3.4–5.0)
Alkaline Phosphatase: 96 U/L
Anion Gap: 3 — ABNORMAL LOW (ref 7–16)
BUN: 37 mg/dL — ABNORMAL HIGH (ref 7–18)
Bilirubin,Total: 0.3 mg/dL (ref 0.2–1.0)
Calcium, Total: 8.8 mg/dL (ref 8.5–10.1)
Chloride: 111 mmol/L — ABNORMAL HIGH (ref 98–107)
Co2: 26 mmol/L (ref 21–32)
Creatinine: 2.61 mg/dL — ABNORMAL HIGH (ref 0.60–1.30)
EGFR (African American): 30 — ABNORMAL LOW
EGFR (Non-African Amer.): 26 — ABNORMAL LOW
Glucose: 80 mg/dL (ref 65–99)
Osmolality: 287 (ref 275–301)
Potassium: 4.5 mmol/L (ref 3.5–5.1)
SGOT(AST): 37 U/L (ref 15–37)
SGPT (ALT): 27 U/L (ref 12–78)
Sodium: 140 mmol/L (ref 136–145)
Total Protein: 6.7 g/dL (ref 6.4–8.2)

## 2013-06-11 ENCOUNTER — Emergency Department: Payer: Self-pay | Admitting: Emergency Medicine

## 2013-06-11 LAB — URINALYSIS, COMPLETE
Bilirubin,UR: NEGATIVE
GLUCOSE, UR: NEGATIVE mg/dL (ref 0–75)
Ketone: NEGATIVE
NITRITE: NEGATIVE
Ph: 6 (ref 4.5–8.0)
Protein: 30
Specific Gravity: 1.012 (ref 1.003–1.030)
Squamous Epithelial: NONE SEEN
WBC UR: 106 /HPF (ref 0–5)

## 2013-06-21 DIAGNOSIS — N319 Neuromuscular dysfunction of bladder, unspecified: Secondary | ICD-10-CM | POA: Insufficient documentation

## 2013-07-14 ENCOUNTER — Emergency Department: Payer: Self-pay | Admitting: Internal Medicine

## 2013-07-14 LAB — URINALYSIS, COMPLETE
Bilirubin,UR: NEGATIVE
Glucose,UR: NEGATIVE mg/dL (ref 0–75)
KETONE: NEGATIVE
Nitrite: POSITIVE
PH: 8 (ref 4.5–8.0)
Protein: 30
SPECIFIC GRAVITY: 1.013 (ref 1.003–1.030)
SQUAMOUS EPITHELIAL: NONE SEEN
Transitional Epi: <1

## 2013-07-14 LAB — COMPREHENSIVE METABOLIC PANEL
ALBUMIN: 2.8 g/dL — AB (ref 3.4–5.0)
ALT: 21 U/L (ref 12–78)
Alkaline Phosphatase: 102 U/L
Anion Gap: 3 — ABNORMAL LOW (ref 7–16)
BUN: 40 mg/dL — ABNORMAL HIGH (ref 7–18)
Bilirubin,Total: 0.2 mg/dL (ref 0.2–1.0)
CHLORIDE: 111 mmol/L — AB (ref 98–107)
CREATININE: 2.78 mg/dL — AB (ref 0.60–1.30)
Calcium, Total: 9.3 mg/dL (ref 8.5–10.1)
Co2: 25 mmol/L (ref 21–32)
EGFR (African American): 28 — ABNORMAL LOW
EGFR (Non-African Amer.): 24 — ABNORMAL LOW
Glucose: 80 mg/dL (ref 65–99)
OSMOLALITY: 286 (ref 275–301)
Potassium: 4.3 mmol/L (ref 3.5–5.1)
SGOT(AST): 26 U/L (ref 15–37)
Sodium: 139 mmol/L (ref 136–145)
TOTAL PROTEIN: 6.4 g/dL (ref 6.4–8.2)

## 2013-07-14 LAB — CBC
HCT: 33.8 % — AB (ref 40.0–52.0)
HGB: 11.6 g/dL — AB (ref 13.0–18.0)
MCH: 30.6 pg (ref 26.0–34.0)
MCHC: 34.4 g/dL (ref 32.0–36.0)
MCV: 89 fL (ref 80–100)
PLATELETS: 197 10*3/uL (ref 150–440)
RBC: 3.8 10*6/uL — AB (ref 4.40–5.90)
RDW: 15.5 % — ABNORMAL HIGH (ref 11.5–14.5)
WBC: 10.3 10*3/uL (ref 3.8–10.6)

## 2013-08-16 ENCOUNTER — Emergency Department: Payer: Self-pay | Admitting: Emergency Medicine

## 2013-08-16 LAB — COMPREHENSIVE METABOLIC PANEL
ALT: 18 U/L (ref 12–78)
AST: 20 U/L (ref 15–37)
Albumin: 3 g/dL — ABNORMAL LOW (ref 3.4–5.0)
Alkaline Phosphatase: 112 U/L
Anion Gap: 1 — ABNORMAL LOW (ref 7–16)
BILIRUBIN TOTAL: 0.3 mg/dL (ref 0.2–1.0)
BUN: 29 mg/dL — AB (ref 7–18)
CALCIUM: 8.9 mg/dL (ref 8.5–10.1)
CHLORIDE: 111 mmol/L — AB (ref 98–107)
CO2: 26 mmol/L (ref 21–32)
CREATININE: 2.43 mg/dL — AB (ref 0.60–1.30)
GFR CALC AF AMER: 32 — AB
GFR CALC NON AF AMER: 28 — AB
GLUCOSE: 91 mg/dL (ref 65–99)
Osmolality: 281 (ref 275–301)
Potassium: 4.2 mmol/L (ref 3.5–5.1)
Sodium: 138 mmol/L (ref 136–145)
TOTAL PROTEIN: 6.8 g/dL (ref 6.4–8.2)

## 2013-08-16 LAB — CBC
HCT: 35.2 % — ABNORMAL LOW (ref 40.0–52.0)
HGB: 11.6 g/dL — ABNORMAL LOW (ref 13.0–18.0)
MCH: 29.5 pg (ref 26.0–34.0)
MCHC: 33 g/dL (ref 32.0–36.0)
MCV: 90 fL (ref 80–100)
PLATELETS: 181 10*3/uL (ref 150–440)
RBC: 3.93 10*6/uL — ABNORMAL LOW (ref 4.40–5.90)
RDW: 15.3 % — AB (ref 11.5–14.5)
WBC: 14.2 10*3/uL — ABNORMAL HIGH (ref 3.8–10.6)

## 2013-12-05 ENCOUNTER — Inpatient Hospital Stay: Payer: Self-pay | Admitting: Family Medicine

## 2013-12-05 LAB — COMPREHENSIVE METABOLIC PANEL
ALK PHOS: 101 U/L
Albumin: 3.1 g/dL — ABNORMAL LOW (ref 3.4–5.0)
Anion Gap: 6 — ABNORMAL LOW (ref 7–16)
BUN: 38 mg/dL — ABNORMAL HIGH (ref 7–18)
Bilirubin,Total: 0.2 mg/dL (ref 0.2–1.0)
CHLORIDE: 111 mmol/L — AB (ref 98–107)
CO2: 22 mmol/L (ref 21–32)
Calcium, Total: 9 mg/dL (ref 8.5–10.1)
Creatinine: 2.5 mg/dL — ABNORMAL HIGH (ref 0.60–1.30)
EGFR (African American): 31 — ABNORMAL LOW
EGFR (Non-African Amer.): 27 — ABNORMAL LOW
Glucose: 91 mg/dL (ref 65–99)
Osmolality: 286 (ref 275–301)
POTASSIUM: 4.4 mmol/L (ref 3.5–5.1)
SGOT(AST): 21 U/L (ref 15–37)
SGPT (ALT): 22 U/L (ref 12–78)
SODIUM: 139 mmol/L (ref 136–145)
Total Protein: 6.7 g/dL (ref 6.4–8.2)

## 2013-12-05 LAB — URINALYSIS, COMPLETE
Bilirubin,UR: NEGATIVE
Glucose,UR: NEGATIVE mg/dL (ref 0–75)
Ketone: NEGATIVE
Nitrite: NEGATIVE
PH: 7 (ref 4.5–8.0)
Protein: 100
RBC,UR: 48 /HPF (ref 0–5)
SPECIFIC GRAVITY: 1.016 (ref 1.003–1.030)
Squamous Epithelial: 5
WBC UR: 392 /HPF (ref 0–5)

## 2013-12-05 LAB — CBC
HCT: 37.2 % — AB (ref 40.0–52.0)
HGB: 12.5 g/dL — ABNORMAL LOW (ref 13.0–18.0)
MCH: 30.2 pg (ref 26.0–34.0)
MCHC: 33.6 g/dL (ref 32.0–36.0)
MCV: 90 fL (ref 80–100)
Platelet: 176 10*3/uL (ref 150–440)
RBC: 4.14 10*6/uL — ABNORMAL LOW (ref 4.40–5.90)
RDW: 15.1 % — ABNORMAL HIGH (ref 11.5–14.5)
WBC: 11 10*3/uL — ABNORMAL HIGH (ref 3.8–10.6)

## 2013-12-05 LAB — CK TOTAL AND CKMB (NOT AT ARMC)
CK, Total: 110 U/L
CK-MB: 1.8 ng/mL (ref 0.5–3.6)

## 2013-12-05 LAB — AMMONIA: Ammonia, Plasma: <10 mcmol/L (ref 11–32)

## 2013-12-05 LAB — TROPONIN I

## 2013-12-06 LAB — BASIC METABOLIC PANEL
Anion Gap: 7 (ref 7–16)
BUN: 40 mg/dL — AB (ref 7–18)
Calcium, Total: 8.5 mg/dL (ref 8.5–10.1)
Chloride: 112 mmol/L — ABNORMAL HIGH (ref 98–107)
Co2: 22 mmol/L (ref 21–32)
Creatinine: 2.3 mg/dL — ABNORMAL HIGH (ref 0.60–1.30)
EGFR (African American): 35 — ABNORMAL LOW
EGFR (Non-African Amer.): 30 — ABNORMAL LOW
Glucose: 89 mg/dL (ref 65–99)
OSMOLALITY: 290 (ref 275–301)
POTASSIUM: 4.5 mmol/L (ref 3.5–5.1)
Sodium: 141 mmol/L (ref 136–145)

## 2013-12-06 LAB — CBC WITH DIFFERENTIAL/PLATELET
BASOS PCT: 0.6 %
Basophil #: 0.1 10*3/uL (ref 0.0–0.1)
EOS ABS: 0.4 10*3/uL (ref 0.0–0.7)
Eosinophil %: 4.2 %
HCT: 33.1 % — AB (ref 40.0–52.0)
HGB: 11.2 g/dL — ABNORMAL LOW (ref 13.0–18.0)
LYMPHS PCT: 17.5 %
Lymphocyte #: 1.7 10*3/uL (ref 1.0–3.6)
MCH: 30.4 pg (ref 26.0–34.0)
MCHC: 33.9 g/dL (ref 32.0–36.0)
MCV: 90 fL (ref 80–100)
Monocyte #: 0.9 x10 3/mm (ref 0.2–1.0)
Monocyte %: 9 %
Neutrophil #: 6.8 10*3/uL — ABNORMAL HIGH (ref 1.4–6.5)
Neutrophil %: 68.7 %
Platelet: 153 10*3/uL (ref 150–440)
RBC: 3.69 10*6/uL — ABNORMAL LOW (ref 4.40–5.90)
RDW: 14.7 % — AB (ref 11.5–14.5)
WBC: 10 10*3/uL (ref 3.8–10.6)

## 2013-12-07 LAB — URINE CULTURE

## 2013-12-10 LAB — CULTURE, BLOOD (SINGLE)

## 2014-01-15 ENCOUNTER — Emergency Department: Payer: Self-pay | Admitting: Emergency Medicine

## 2014-01-15 LAB — URINALYSIS, COMPLETE
Bilirubin,UR: NEGATIVE
GLUCOSE, UR: NEGATIVE mg/dL (ref 0–75)
Ketone: NEGATIVE
NITRITE: POSITIVE
Ph: 9 (ref 4.5–8.0)
Protein: 100
RBC,UR: 568 /HPF (ref 0–5)
Specific Gravity: 1.01 (ref 1.003–1.030)

## 2014-01-15 LAB — CBC WITH DIFFERENTIAL/PLATELET
BASOS ABS: 0 10*3/uL (ref 0.0–0.1)
BASOS PCT: 0.6 %
Basophil #: 0.1 10*3/uL (ref 0.0–0.1)
Basophil %: 0.4 %
EOS ABS: 0.3 10*3/uL (ref 0.0–0.7)
EOS PCT: 2.8 %
Eosinophil #: 0.3 10*3/uL (ref 0.0–0.7)
Eosinophil %: 2 %
HCT: 33.3 % — AB (ref 40.0–52.0)
HCT: 35.1 % — ABNORMAL LOW (ref 40.0–52.0)
HGB: 10.8 g/dL — ABNORMAL LOW (ref 13.0–18.0)
HGB: 11.8 g/dL — AB (ref 13.0–18.0)
LYMPHS ABS: 1.1 10*3/uL (ref 1.0–3.6)
LYMPHS PCT: 8.7 %
LYMPHS PCT: 11.5 %
Lymphocyte #: 1.4 10*3/uL (ref 1.0–3.6)
MCH: 30 pg (ref 26.0–34.0)
MCH: 30.9 pg (ref 26.0–34.0)
MCHC: 32.4 g/dL (ref 32.0–36.0)
MCHC: 33.7 g/dL (ref 32.0–36.0)
MCV: 93 fL (ref 80–100)
MCV: 92 fL (ref 80–100)
MONO ABS: 1.3 x10 3/mm — AB (ref 0.2–1.0)
MONO ABS: 1.5 x10 3/mm — AB (ref 0.2–1.0)
MONOS PCT: 10.7 %
Monocyte %: 12.1 %
NEUTROS PCT: 78.2 %
NEUTROS PCT: 73 %
Neutrophil #: 9.7 10*3/uL — ABNORMAL HIGH (ref 1.4–6.5)
Neutrophil #: 9.2 10*3/uL — ABNORMAL HIGH (ref 1.4–6.5)
Platelet: 130 10*3/uL — ABNORMAL LOW (ref 150–440)
Platelet: 136 10*3/uL — ABNORMAL LOW (ref 150–440)
RBC: 3.6 10*6/uL — ABNORMAL LOW (ref 4.40–5.90)
RBC: 3.83 10*6/uL — ABNORMAL LOW (ref 4.40–5.90)
RDW: 15 % — ABNORMAL HIGH (ref 11.5–14.5)
RDW: 14.8 % — AB (ref 11.5–14.5)
WBC: 12.4 10*3/uL — ABNORMAL HIGH (ref 3.8–10.6)
WBC: 12.6 10*3/uL — ABNORMAL HIGH (ref 3.8–10.6)

## 2014-01-15 LAB — BASIC METABOLIC PANEL
Anion Gap: 8 (ref 7–16)
BUN: 47 mg/dL — ABNORMAL HIGH (ref 7–18)
CO2: 22 mmol/L (ref 21–32)
CREATININE: 2.95 mg/dL — AB (ref 0.60–1.30)
Calcium, Total: 8.4 mg/dL — ABNORMAL LOW (ref 8.5–10.1)
Chloride: 116 mmol/L — ABNORMAL HIGH (ref 98–107)
EGFR (African American): 26 — ABNORMAL LOW
GFR CALC NON AF AMER: 22 — AB
Glucose: 96 mg/dL (ref 65–99)
OSMOLALITY: 303 (ref 275–301)
Potassium: 4.2 mmol/L (ref 3.5–5.1)
Sodium: 146 mmol/L — ABNORMAL HIGH (ref 136–145)

## 2014-01-15 LAB — PROTIME-INR
INR: 1.1
Prothrombin Time: 13.9 secs (ref 11.5–14.7)

## 2014-01-17 DIAGNOSIS — N1330 Unspecified hydronephrosis: Secondary | ICD-10-CM | POA: Insufficient documentation

## 2014-02-16 ENCOUNTER — Emergency Department: Payer: Self-pay | Admitting: Student

## 2014-02-16 LAB — CBC
HCT: 36.5 % — ABNORMAL LOW (ref 40.0–52.0)
HGB: 11.7 g/dL — ABNORMAL LOW (ref 13.0–18.0)
MCH: 29.8 pg (ref 26.0–34.0)
MCHC: 32.2 g/dL (ref 32.0–36.0)
MCV: 93 fL (ref 80–100)
Platelet: 150 10*3/uL (ref 150–440)
RBC: 3.95 10*6/uL — ABNORMAL LOW (ref 4.40–5.90)
RDW: 14.5 % (ref 11.5–14.5)
WBC: 11.8 10*3/uL — ABNORMAL HIGH (ref 3.8–10.6)

## 2014-02-16 LAB — URINALYSIS, COMPLETE
BACTERIA: NONE SEEN
Bilirubin,UR: NEGATIVE
GLUCOSE, UR: NEGATIVE mg/dL (ref 0–75)
Ketone: NEGATIVE
Nitrite: POSITIVE
Ph: 8 (ref 4.5–8.0)
Protein: 100
SPECIFIC GRAVITY: 1.01 (ref 1.003–1.030)
Squamous Epithelial: NONE SEEN
WBC UR: 493 /HPF (ref 0–5)

## 2014-02-16 LAB — BASIC METABOLIC PANEL
ANION GAP: 5 — AB (ref 7–16)
BUN: 39 mg/dL — ABNORMAL HIGH (ref 7–18)
Calcium, Total: 8.8 mg/dL (ref 8.5–10.1)
Chloride: 115 mmol/L — ABNORMAL HIGH (ref 98–107)
Co2: 22 mmol/L (ref 21–32)
Creatinine: 2.82 mg/dL — ABNORMAL HIGH (ref 0.60–1.30)
EGFR (African American): 27 — ABNORMAL LOW
EGFR (Non-African Amer.): 23 — ABNORMAL LOW
Glucose: 100 mg/dL — ABNORMAL HIGH (ref 65–99)
Osmolality: 293 (ref 275–301)
Potassium: 4.5 mmol/L (ref 3.5–5.1)
SODIUM: 142 mmol/L (ref 136–145)

## 2014-02-19 LAB — URINE CULTURE

## 2014-05-01 IMAGING — CT CT HEAD WITHOUT CONTRAST
3 of 4 series · 17 of 30 positions shown, 19 images · non-contrast
Comparison: none

REASON FOR EXAM: syncope
COMMENTS:

PROCEDURE:     CT  - CT HEAD WITHOUT CONTRAST  - October 07, 2011  [DATE]
RESULT:     Comparison:  02/25/2008
TECHNIQUE: Multiple axial images from the foramen magnum to the vertex were
obtained without IV contrast.

[Series 2: without · axial · non-contrast · 0.42mm/px · z∈[+458,+562]mm · 6 of 31 slices shown]
[im 5/31  brain]
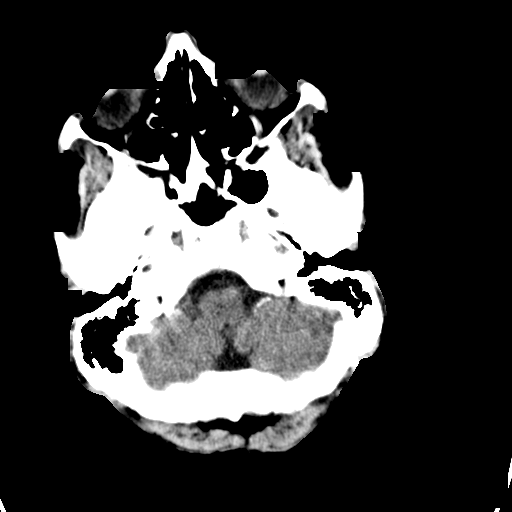
[im 9/31  brain]
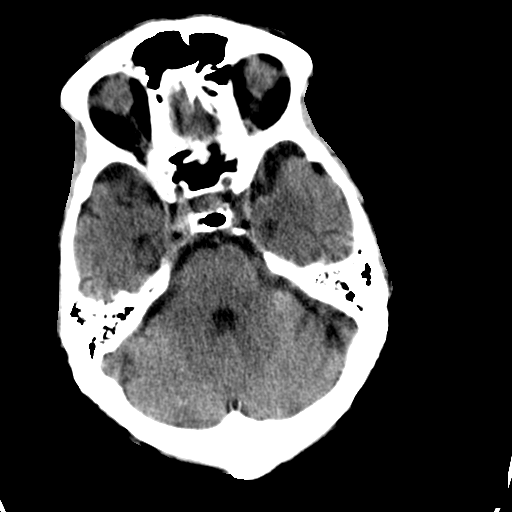
[im 13/31  brain]
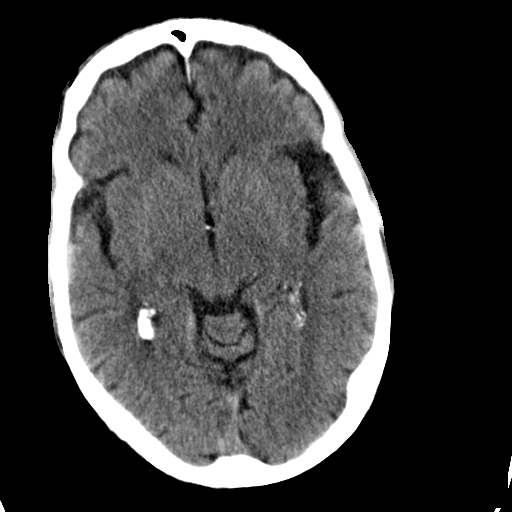
[im 18/31  brain]
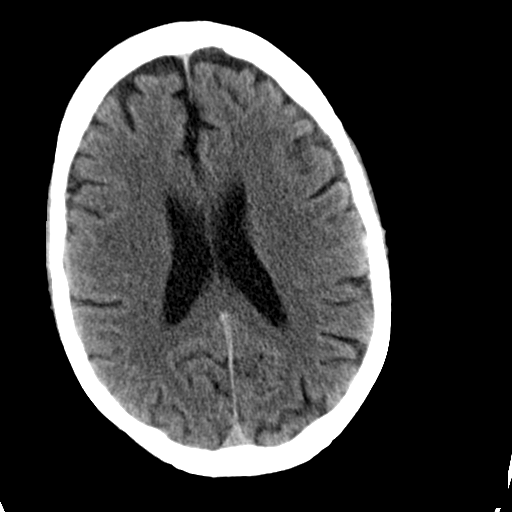
[im 22/31  brain]
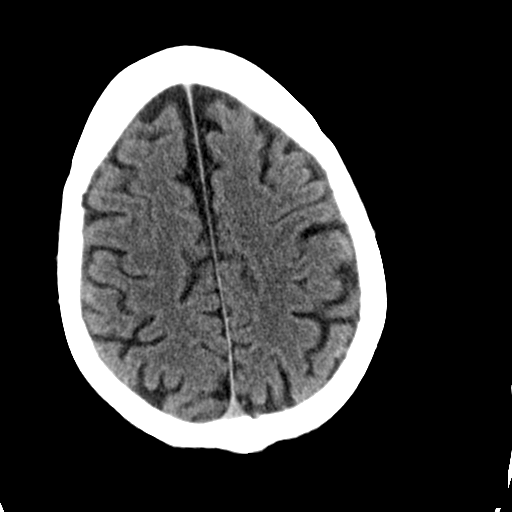
[im 26/31  brain]
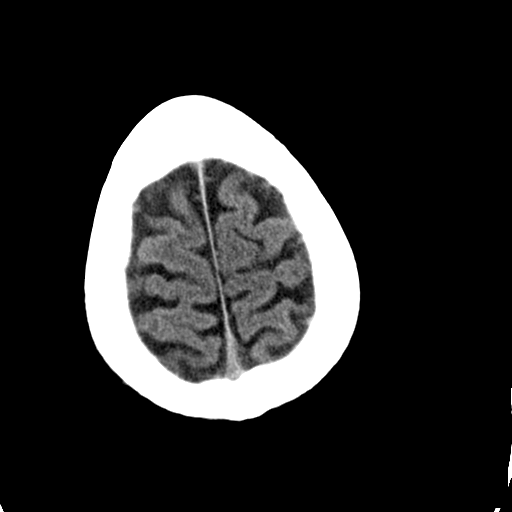

[Series 4: without recon · axial · non-contrast · 0.42mm/px · z∈[+468,+568]mm · 5 of 31 slices shown, 7 images]
[im 6/31  brain]
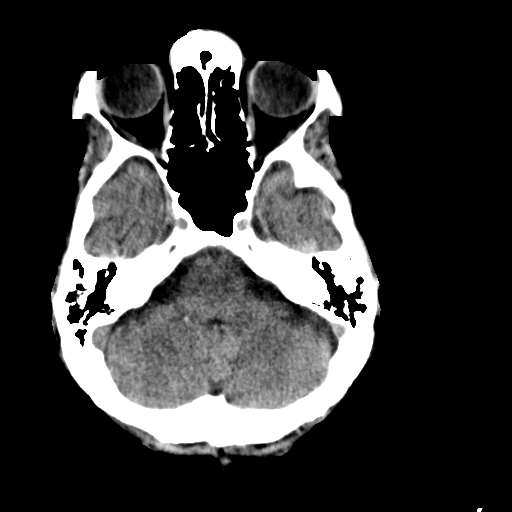
[im 6/31  bone]
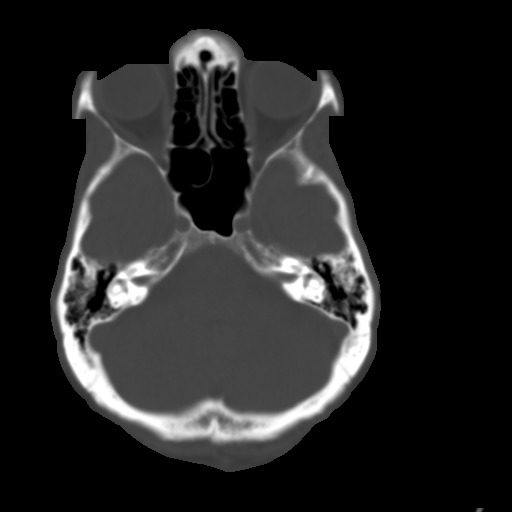
[im 11/31  brain]
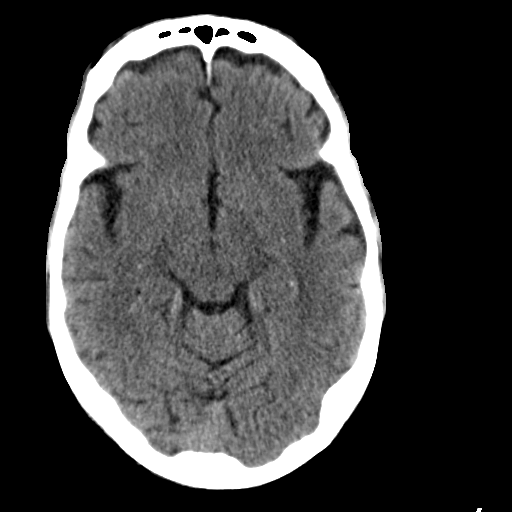
[im 16/31  brain]
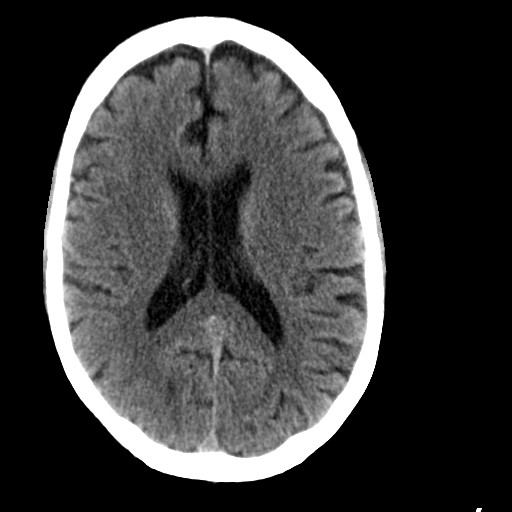
[im 21/31  brain]
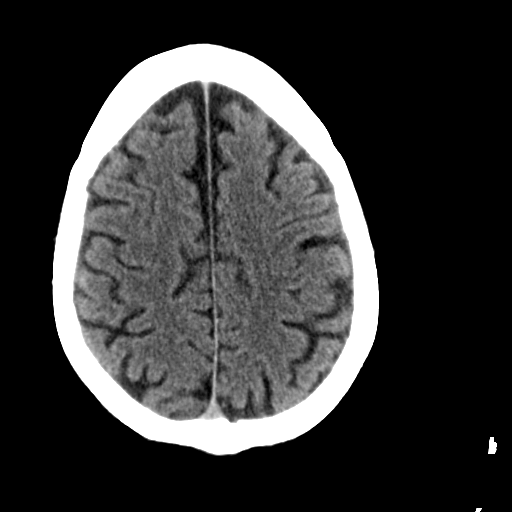
[im 26/31  brain]
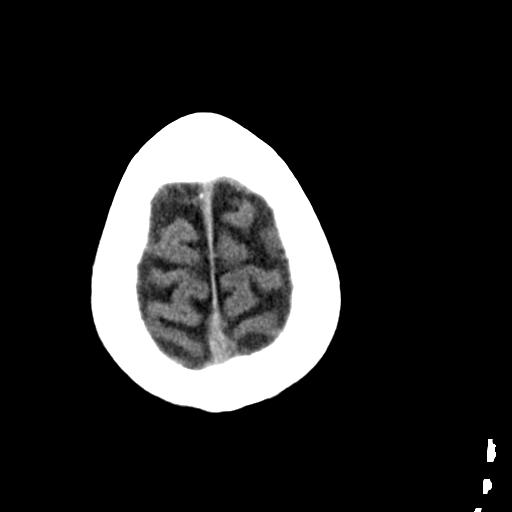
[im 26/31  bone]
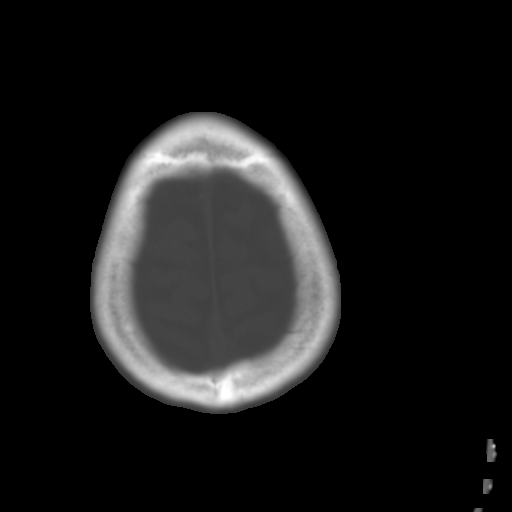

[Series 5: bone recon · axial · 0.42mm/px · z∈[+460,+570]mm · 6 of 32 slices shown]
[im 5/32  bone]
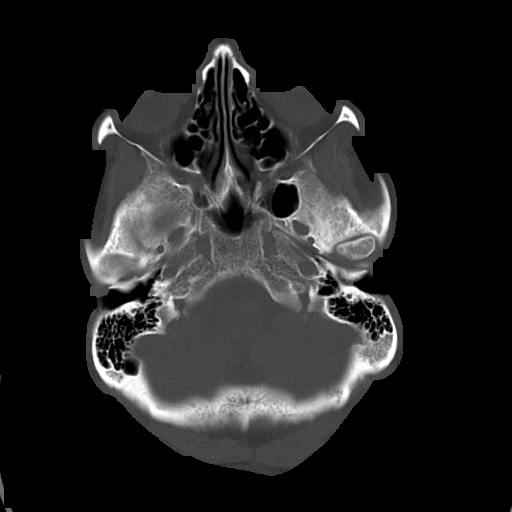
[im 9/32  bone]
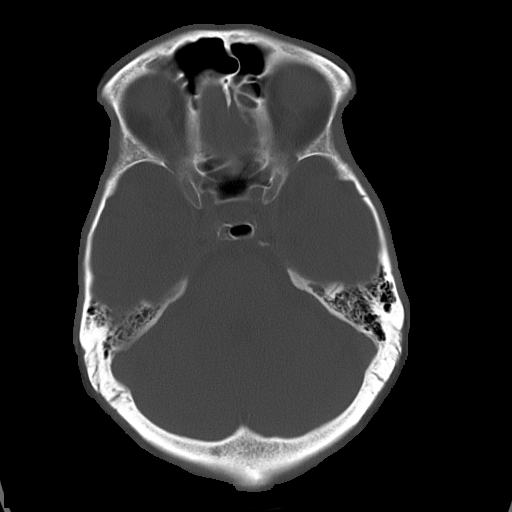
[im 14/32  bone]
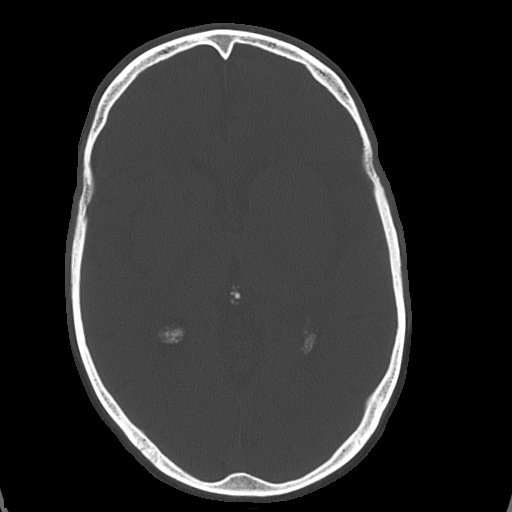
[im 18/32  bone]
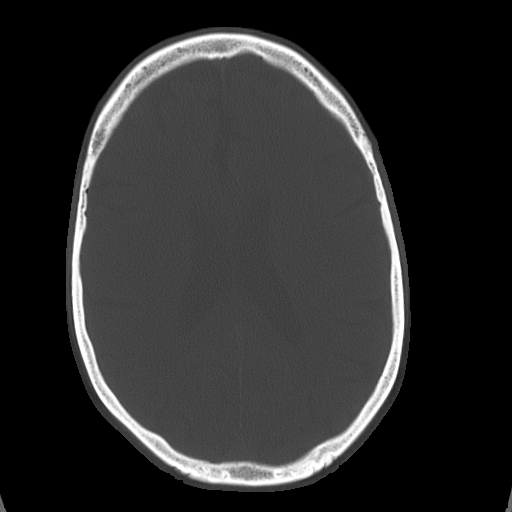
[im 23/32  bone]
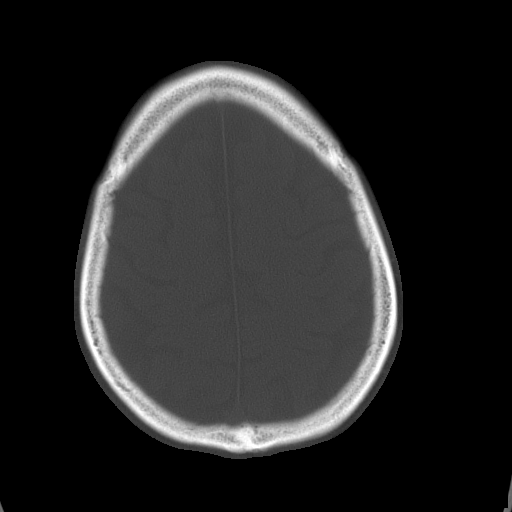
[im 27/32  bone]
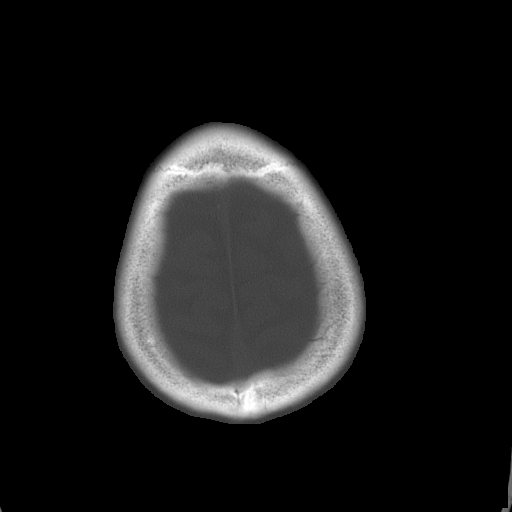

[17 of 30 positions shown; findings below may reference images not displayed]

FINDINGS: There is no evidence for mass effect, midline shift, or extra-axial fluid
collections. There is no evidence for space-occupying lesion, intracranial
hemorrhage, or cortical-based area of infarction.

The osseous structures are unremarkable.
IMPRESSION: No acute intracranial process.

## 2014-05-02 IMAGING — US US CAROTID DUPLEX BILAT
1 series · 14 of 24 positions shown · non-contrast
Comparison: none

REASON FOR EXAM: syncope
COMMENTS:

[Series 1: us carotid duplex bilat · 0.07mm/px · 14 of 54 slices shown]
[im 1/54]
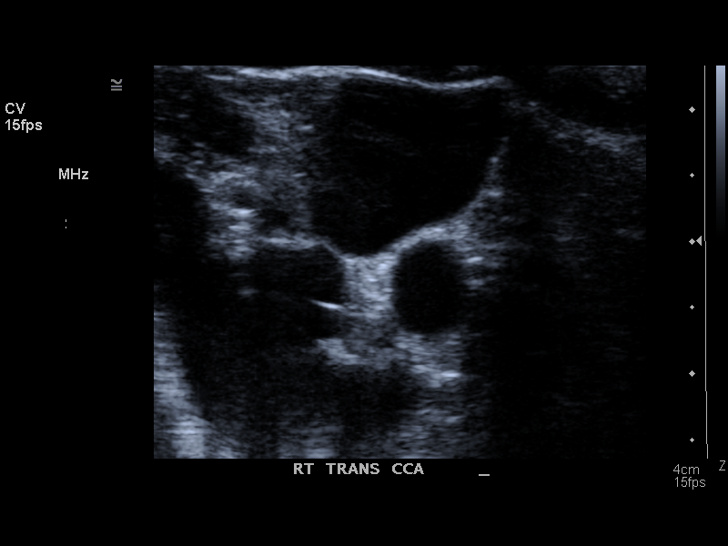
[im 5/54]
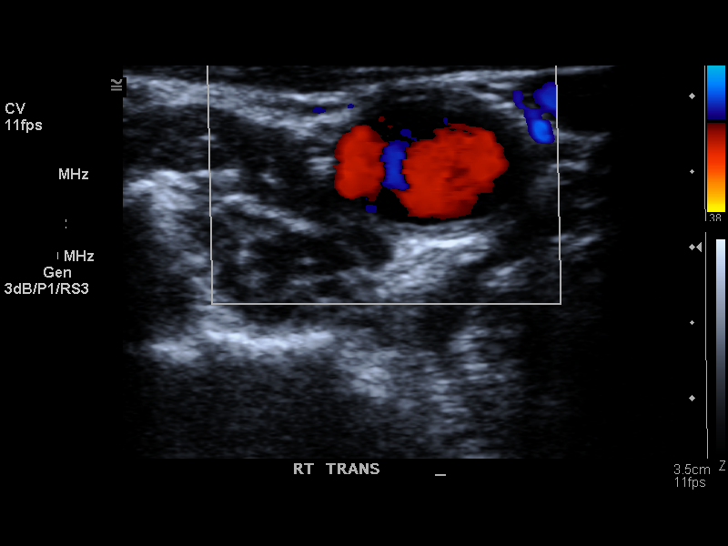
[im 10/54]
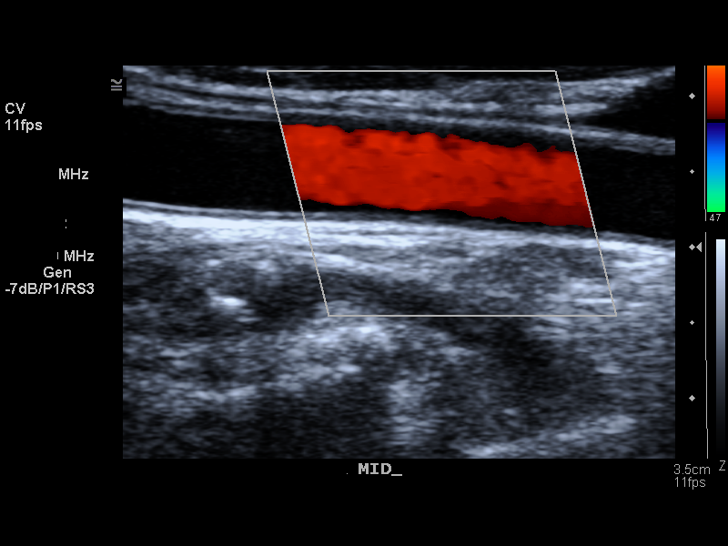
[im 14/54]
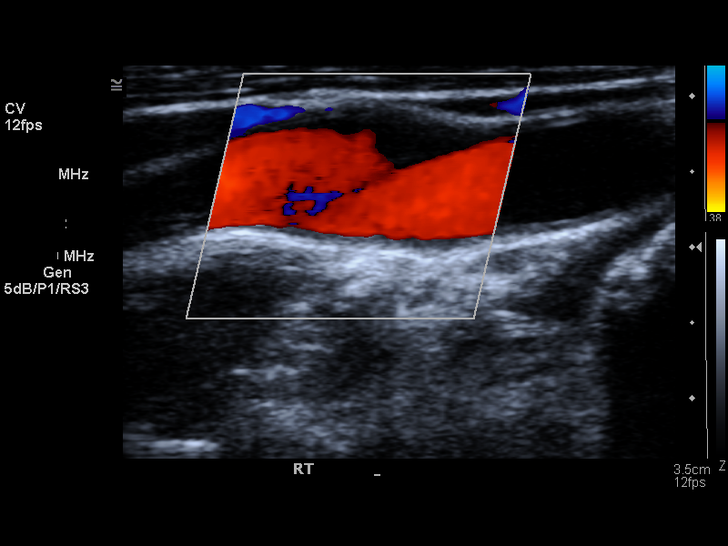
[im 17/54]
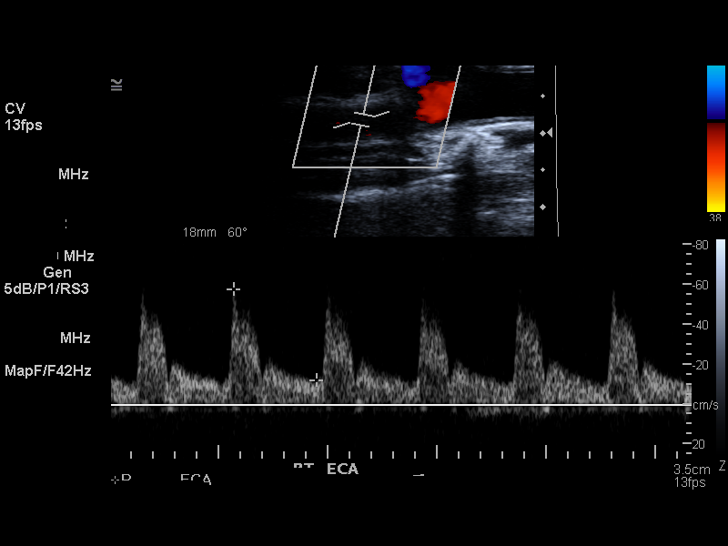
[im 21/54]
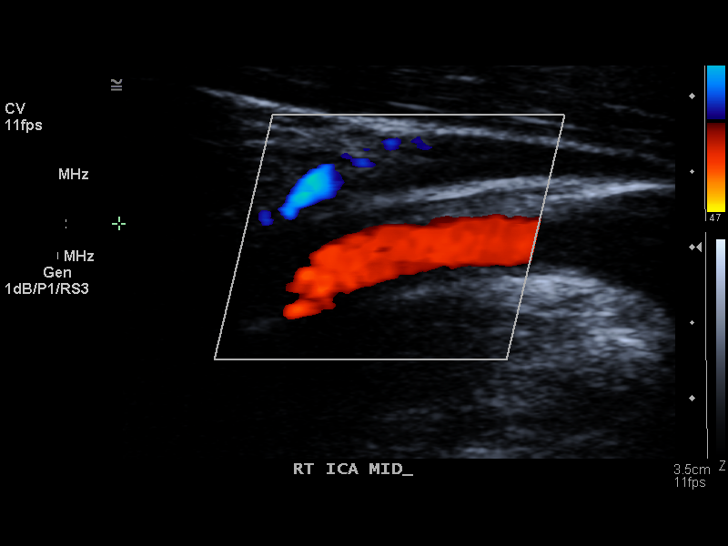
[im 26/54]
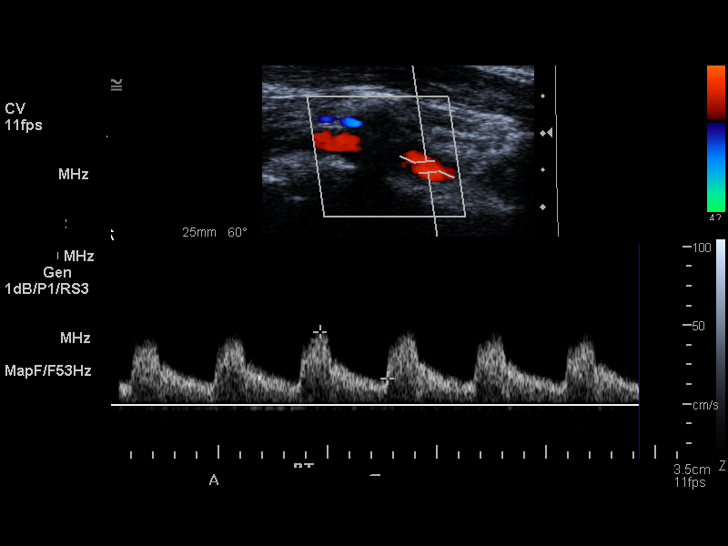
[im 28/54]
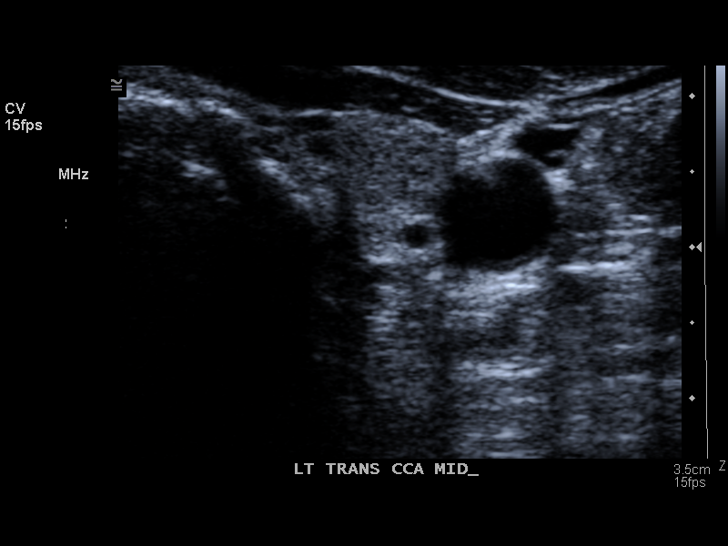
[im 33/54]
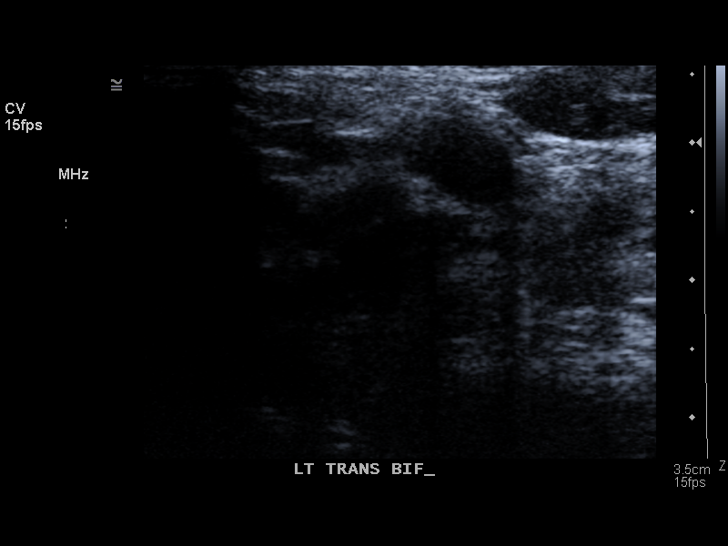
[im 37/54]
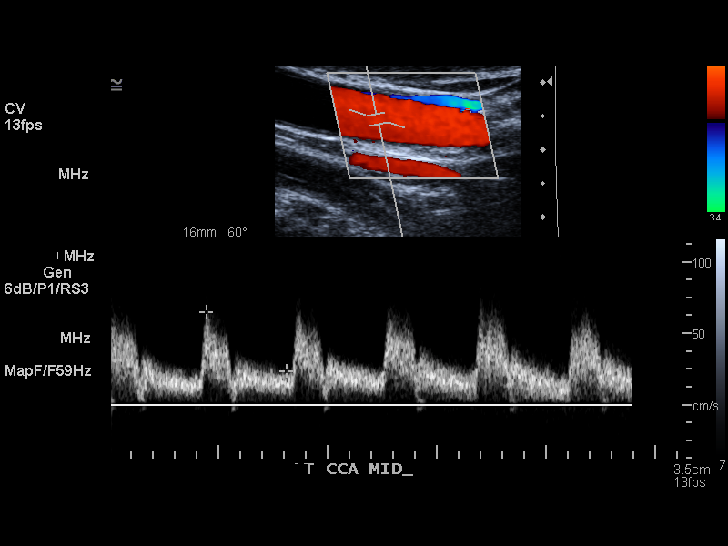
[im 42/54]
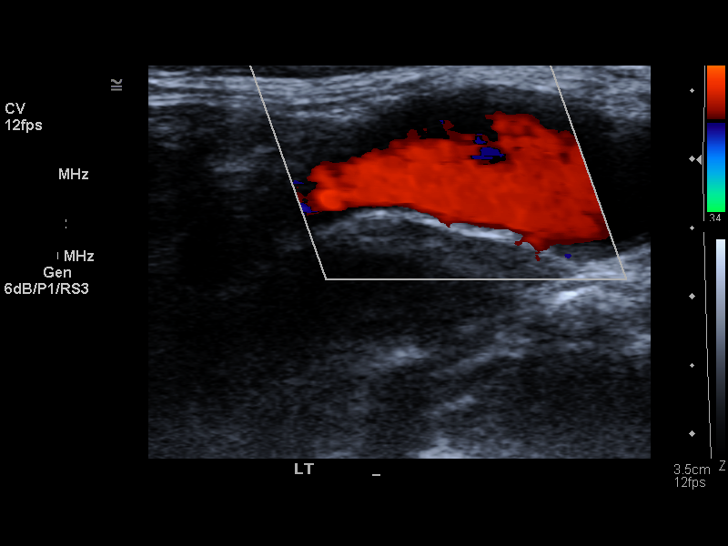
[im 44/54]
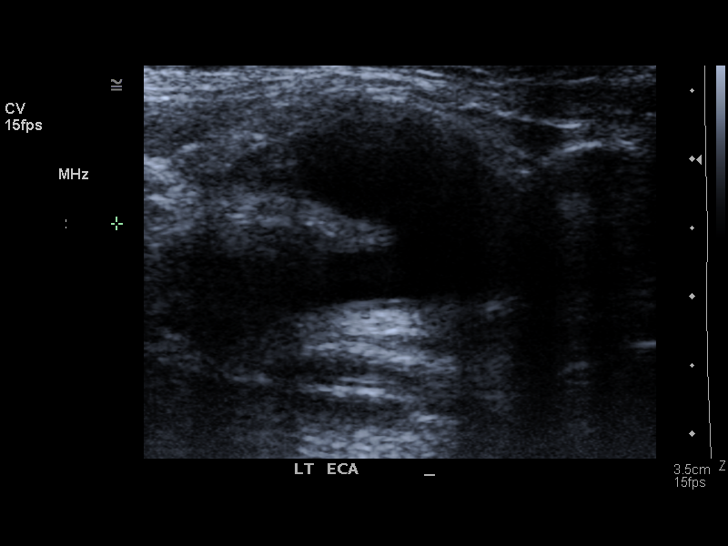
[im 49/54]
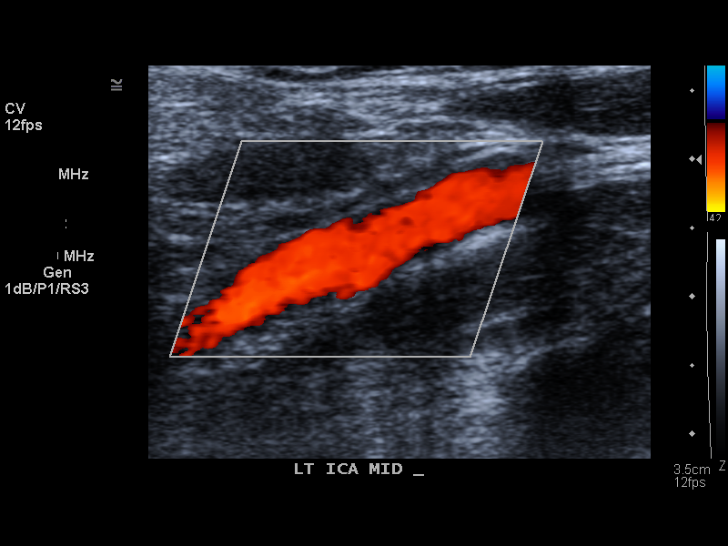
[im 54/54]
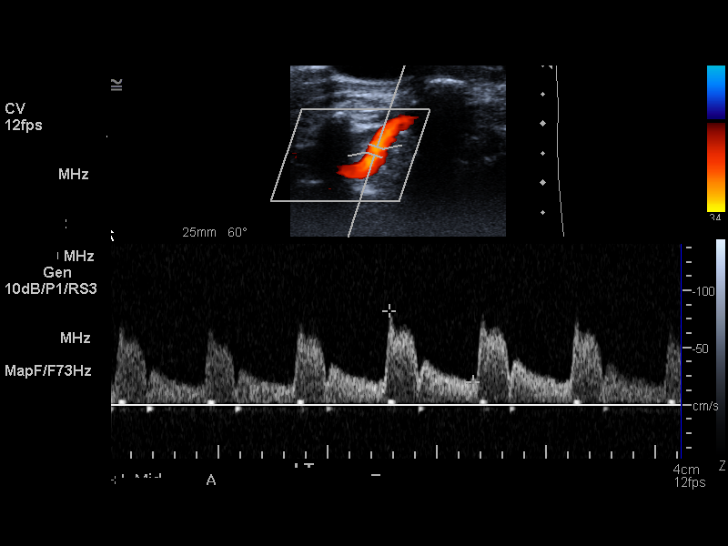

[14 of 24 positions shown; findings below may reference images not displayed]

PROCEDURE:     US  - US CAROTID DOPPLER BILATERAL  - October 08, 2011  [DATE]

RESULT:     Doppler interrogation of the carotid arteries demonstrates the
presence of some intimal thickening without significant calcified
atherosclerotic plaque. Visually there is no significant stenosis. Antegrade
flow is noted in both vertebrals and in the carotids. There is no flow
reversal demonstrated. The color and spectral Doppler appearance is within
normal limits. The peak systolic velocities appear to be normal bilaterally
in the carotids. The internal to common carotid peak systolic velocity
ratios are 1.5 on the right and 1.2 on the left.
IMPRESSION: Minimal intimal thickening in the carotid systems without
evidence of hemodynamically significant stenosis.

[REDACTED]

## 2014-09-19 ENCOUNTER — Emergency Department
Admit: 2014-09-19 | Disposition: A | Discharge status: Home / Self Care | Payer: Self-pay | Admitting: Emergency Medicine

## 2014-09-19 LAB — COMPREHENSIVE METABOLIC PANEL
ALBUMIN: 3.5 g/dL
ALK PHOS: 108 U/L
Anion Gap: 4 — ABNORMAL LOW (ref 7–16)
BILIRUBIN TOTAL: 0.4 mg/dL
BUN: 39 mg/dL — AB
CO2: 25 mmol/L
CREATININE: 2.3 mg/dL — AB
Calcium, Total: 8.9 mg/dL
Chloride: 110 mmol/L
EGFR (African American): 34 — ABNORMAL LOW
EGFR (Non-African Amer.): 30 — ABNORMAL LOW
Glucose: 72 mg/dL
Potassium: 3.7 mmol/L
SGOT(AST): 24 U/L
SGPT (ALT): 15 U/L — ABNORMAL LOW
Sodium: 139 mmol/L
Total Protein: 6.8 g/dL

## 2014-09-19 LAB — CBC
HCT: 40.5 % (ref 40.0–52.0)
HGB: 13.2 g/dL (ref 13.0–18.0)
MCH: 30 pg (ref 26.0–34.0)
MCHC: 32.7 g/dL (ref 32.0–36.0)
MCV: 92 fL (ref 80–100)
Platelet: 173 10*3/uL (ref 150–440)
RBC: 4.42 10*6/uL (ref 4.40–5.90)
RDW: 15.8 % — ABNORMAL HIGH (ref 11.5–14.5)
WBC: 12.8 10*3/uL — ABNORMAL HIGH (ref 3.8–10.6)

## 2014-09-19 NOTE — Op Note (Signed)
PATIENT NAME:  Alan Small, Alan Small MR#:  [id number] DATE OF BIRTH:  Jul 06, 1955  DATE OF PROCEDURE:  05/07/2012  PREOPERATIVE DIAGNOSES: Left ureterolithiasis, hydronephrosis, renal failure.   POSTOPERATIVE DIAGNOSES: Left ureterolithiasis, hydronephrosis, renal failure.   PROCEDURES: Cystoscopy, left double-J ureteral stent placement.   SURGEON: Denice Bors. Jacqlyn Larsen, MD  ANESTHESIA: Laryngeal mask airway anesthesia.   INDICATIONS: The patient is a 59 year old gentleman with a long history of nephrolithiasis and chronic urinary retention. He recently presented with a large retroperitoneal abscess on the right related to chronic urinary retention. He was noted to have large bilateral recurrent renal calculi. He returned to the Emergency Room with altered mental status. A CT scan demonstrated significant enlargement and obstruction of the left kidney which is new compared to his previous hospitalization. The large 3 cm egg-shaped stone in the left renal pelvis is now at the UPJ region with obstruction. With the worsening serum creatinine we have elected to proceed with cystoscopy and stent placement.   DESCRIPTION OF PROCEDURE: After informed consent was obtained, the patient was taken to the Operating Room and placed in the dorsal lithotomy position under laryngeal mask airway anesthesia. The patient was then prepped and draped in the usual standard fashion. The 34 French rigid cystoscope was introduced into the urethra under direct vision with no urethral abnormalities noted. Upon entering the prostatic fossa, minimal hypertrophy was present with only partial visual obstruction. Several large stones were noted within the ejaculatory ducts bilaterally. These were deep within the prostate and could not be removed without further intervention. The stones were therefore left in place. Upon entering the bladder, the mucosa was inspected in its entirety. No significant mucosal lesions were noted. Significant  trabeculation and folds were noted within the bladder consistent with chronic urinary retention. The trigone was not well defined. The ureteral orifices were difficult to identify. They were more lateral and concealed within folds of the bladder also consistent with chronic urinary retention and chronic bladder overdistention. The left ureteral orifice was subsequently identified. A guidewire was easily advanced into the opening. It was easily advanced into the upper pole collecting system under fluoroscopic guidance without difficulty. A 7 French x 26 cm double-J ureteral stent was advanced over the guidewire into the upper pole collecting system without difficulty. No significant resistance was met at the level of the stone. Adequate curl was noted within the kidney. Upon withdrawal of the guidewire, adequate curl was also noted within the urinary bladder. No significant purulent drainage or urine under pressure was noted extending through the stent. The bladder was drained. The cystoscope was removed. The patient was returned to the supine position and awakened from laryngeal mask airway anesthesia. He was taken to the recovery room in stable condition. There were no problems or complications. The patient tolerated the procedure well.    ____________________________ Denice Bors. Jacqlyn Larsen, MD bsc:cms D: 05/08/2012 16:29:00 ET T: 05/09/2012 10:09:56 ET JOB#: [id number]  cc: Denice Bors. Jacqlyn Larsen, MD, <Dictator> Denice Bors Donalda Job MD ELECTRONICALLY SIGNED 05/12/2012 22:29

## 2014-09-19 NOTE — Op Note (Signed)
PATIENT NAME:  Alan Small, Alan Small MR#:  [id number] DATE OF BIRTH:  04/09/1956  DATE OF PROCEDURE:  05/07/2012  PREOPERATIVE DIAGNOSES: Left ureterolithiasis, hydronephrosis, renal failure.   POSTOPERATIVE DIAGNOSES: Left ureterolithiasis, hydronephrosis, renal failure.   PROCEDURE: Cystoscopy, left double-J ureteral stent placement.   SURGEON: Edrick Oh, M.D.   ANESTHESIA: Laryngeal mask airway anesthesia.   INDICATIONS: The patient is a 59 year old white gentleman with a long history of nephrolithiasis and chronic renal failure. He recently presented with urinary retention and large right retroperitoneal abscess. He had demonstrated bilateral renal calculi at that time with no evidence of obstruction. He recently returned for altered mental status. He had previously been instructed as to self intermittent catheterization for chronic urinary retention. He was not performing the procedure as instructed. He has been managed with chronic Foley catheter in the past. He has removed the catheter without instruction. On presentation he was noted to have an approximate 3-cm stone that was previously in the left renal pelvis, now at the UPJ with significant obstruction and hydronephrosis. He has evidence of urosepsis without significant hemodynamic abnormalities. He presents for stent placement.   DESCRIPTION OF PROCEDURE: After informed consent was obtained, the patient was taken to the operating room and placed in the dorsal lithotomy position under laryngeal mask airway anesthesia. The patient was then prepped and draped in the usual standard fashion. The 32 French rigid cystoscope was introduced into the urethra under direct vision with no urethral abnormalities noted. Upon entering the prostate, moderate bilobar hypertrophy was noted with only partial visual obstruction noted. Upon entering the bladder, the mucosa was inspected in its entirety. The bladder was noted to be very trabeculated with a few  superficial cellules and small diverticula. The trigone was not well visualized. The area that was felt to have ureteral orifices was not easily identified. With further examination, the ureteral orifices were noted to be more lateral than normal position. They were slightly hidden between some areas of trabeculation. Once these were identified, some contraction of the ureters were noted at the site of the left ureteral orifice. A guidewire was advanced into the opening. It was easily advanced into the upper pole collecting system without difficulty.  It was easily bypassed by the large UPJ stone. A 7 French x 26-cm double-J ureteral stent was advanced over the guidewire into the upper pole collecting system without difficulty. No significant resistance was met at the level of the stone. No significant debris or urine under pressure was noted draining from the left collecting system. Upon withdrawal of the guidewire, adequate curl was noted within the renal pelvis. Adequate curl was also noted within the urinary bladder. The bladder was then drained. The cystoscope was removed. The patient was returned to the supine position and awakened from laryngeal mask airway anesthesia and was taken to the recovery room in stable condition. There were no problems or complications. The patient tolerated the procedure well.     ____________________________ Denice Bors. Jacqlyn Larsen, MD bsc:bjt D: 05/07/2012 21:23:27 ET T: 05/08/2012 10:36:25 ET JOB#: [id number]  cc: Denice Bors. Jacqlyn Larsen, MD, <Dictator> Denice Bors Jeremaine Maraj MD ELECTRONICALLY SIGNED 05/12/2012 22:29

## 2014-09-19 NOTE — Consult Note (Signed)
Details:    - This is a followup visit for this gentleman with schizophrenia who is in the hospital with pyelonephritis. Today when I came to see him he was awake and alert. Made good eye contact. Psychomotor activity was normal. He was able to give me a brief description of why he was in the hospital and he was aware of where he was. He remembered seeing me from yesterday. He denied that he was having any hallucinations today. He said that his mood was good. He was smiling and was not agitated.  Looking at the medication sheets I'm not sure whether the Prolixin Decanoate shot has been given or not. It looks like it is still pending. I will followup tomorrow and hopefully will have been given by men. Meanwhile continue other psychiatric medicine. Supportive therapy. We had a discussion today about catheterization. Patient was able to describe catheterization and explained to me that last time he left the hospital he was able to do it for a while but then he ran out of catheters. He also explained that he got some help from some other people for a while. Based on all of this I am a little bit more confident about his ability to do some basic self-care once he gets out of the hospital .   Electronic Signatures: Rolin Schult, Madie Reno (MD)  (Signed 06-Dec-13 18:21)  Authored: Details   Last Updated: 06-Dec-13 18:21 by Gonzella Lex (MD)

## 2014-09-19 NOTE — H&P (Signed)
PATIENT NAME:  Alan Small, Alan Small MR#:  [id number] DATE OF BIRTH:  02/11/1956  DATE OF ADMISSION:  03/26/2012  ADMITTING DIAGNOSES:  1. Chronic urinary retention and right flank fluid collection, likely either urinoma or abscess.  2. Chronic schizophrenia, paranoid.  3. History of extensive nephrolithiasis.   HISTORY: This is a 59 year old chronically schizophrenic paranoid man with a longstanding history of nephrolithiasis. He awoke this morning with flank pain and swelling. Came to the emergency room. Recent admission to the hospital in May of 2013 demonstrated a creatinine of a little over 2. The patient states that he has intermittent urinary urgency. Some history cannot be obtained from him due to his mental status. Surgical services were asked to evaluate.   ALLERGIES: Mellaril, morphine, Navane, Thorazine.   MEDICATIONS: Include: 1. Risperdal 3 mg by mouth once a day, 2. Fluoxetine 40 mg by mouth once a day. 3. Fluphenazine 37.5 mg injectable every 2 weeks. 4. Diphenhydramine 50 mg by mouth orally once a day at bedtime. 5. Benztropine 2 mg by mouth b.i.d.   PAST MEDICAL HISTORY:  1. Chronic paranoid schizophrenia. 2. History of syncope with extensive workup. 3. History of urinary nephrolithiasis as described above.   PAST SURGICAL HISTORY: The patient has had multiple interventions for urinary calculi on the left side.   REVIEW OF SYSTEMS: As described above.   PHYSICAL EXAMINATION:  GENERAL: The patient is alert, cooperative.   VITAL SIGNS: Temperature is 98.4, pulse is 85, blood pressure is 112/71.   LUNGS: Clear.   HEART: Regular rate and rhythm.   ABDOMEN: Soft, scaphoid, minimally tender in the right upper quadrant with mass effect. There is a large, at least 15 cm soft tissue mass in the right flank which is ballotable and slightly tender with no evidence of cellulitis or erythema.   EXTREMITIES: Warm and well perfused.   PSYCHIATRIC: Appropriate mood, judgment, and  affect.   LABORATORY VALUES: Urinalysis demonstrates 1966 WBCs per high-powered field, 159 RBCs pre high-powered field, 3+ leukocyte esterase, 3+ bacteria, 3+ blood. Pro time 14.2. White count is 12, hemoglobin 8.5, hematocrit 26.7, platelet count 144,000. Bilirubin 0.2, albumin 2.1, alkaline phosphatase 158, creatinine 2.2, sodium 133, potassium 3.9, chloride 101. Review of CT scan demonstrates a large fluid collection within the right iliopsoas muscle and right flank with compression of the kidney anteriorly. There is a large bilateral staghorn calculi. There is no evidence of hydronephrosis. There is marked bladder distention.   IMPRESSION:  52. A 59 year old white male with chronic urinary retention, large right retroperitoneal, right flank fluid collection, either urinoma, hematoma, or abscess.  2. Mild chronic renal insufficiency.  3. Chronic paranoid schizophrenia.  4. Chronic bilateral nephrolithiasis.   PLAN: The patient will be admitted, hydrated. Urology will be consulted. Plans are for a percutaneous drainage of this fluid collection and Foley catheter placement as needed. I will begin him on intravenous Levaquin. He will need intervention at some point in time for the staghorn calculus on the right side. I discussed this case with Dr. Jacqlyn Larsen over the telephone directly. ____________________________ Jeannette How. Marina Gravel, MD mab:vtd D: 03/26/2012 18:06:57 ET   T: 03/27/2012 05:47:34 ET   JOB#: [id number] cc: Elta Guadeloupe A. Marina Gravel, MD, <Dictator> Hortencia Conradi MD ELECTRONICALLY SIGNED 03/30/2012 7:41

## 2014-09-19 NOTE — Consult Note (Signed)
PATIENT NAME:  Alan Small, Alan Small MR#:  [id number] DATE OF BIRTH:  1955/11/25  DATE OF CONSULTATION:  05/06/2012  REFERRING PHYSICIAN:   CONSULTING PHYSICIAN:  Denice Bors. Jacqlyn Larsen, MD  HISTORY: Alan Small is a 59 year old gentleman well known to the practice with a long history of chronic urinary retention and nephrolithiasis. He has undergone extensive evaluation in the past for both conditions. This includes evaluation at [facility]. He was supposed to have been on self intermittent catheterization due to the chronic urinary retention. He has not performed this on any basis. He recently presented to the Emergency Room with flank pain and discomfort. He was found to have urinary retention with hydronephrosis and a large retroperitoneal abscess. The abscess grew Staphylococcus The drain fell out after only several days of placement. He was subsequently discharged. He has been seen in the office since with recurrent Staph in his urine. He was recently treated with a course of antibiotic therapy. He was provided with catheters for self intermittent catheterization, proper technique, and review of self intermittent catheterization was undertaken with the patient. He has not been performing self intermittent catheterization as per routine. We have also previously tried Foley catheter placement which he removes on his own. There is a very significant social issue with this patient. He is in a facility which does not provide any significant care oversight. He is totally self sustaining with his mental condition. He badly needs some degree of oversight for proper healthcare. This has been an ongoing issue. He returned to the Emergency Room with altered mental status and confusion. A follow-up CT scan demonstrates persistence of the retroperitoneal abscess. His serum creatinine was in the mid 4 range with an elevated white blood cell count around 27,000. He was also noted to have more prominent left-sided  hydronephrosis. He does have known bilateral large renal calculi. The stone that was previously in the left renal pelvis is now at the UPJ region. This is causing moderate obstruction. He is currently afebrile with vital signs stable. He demonstrates no evidence of active sepsis despite positive blood cultures that also appear to be Staph. Final determination, however, is still pending. With the current findings, further intervention with stent placement is recommended. In the ideal situation percutaneous nephrostomy tube placement would be preferred. This, however, would need to be in place for several weeks prior to percutaneous nephrolithotomy for more definitive treatment. Once the patient is discharged, it is very unlikely that he would leave the tube in place. We have, therefore, elected to proceed with cystoscopy and stent placement for drainage of the kidney pending further definitive therapy.   PAST MEDICAL HISTORY:  1. Schizophrenia. 2. Nephrolithiasis.  3. Chronic urinary retention. 4. Detrusor hypocontractility. 5. Diverticulitis. 6. Tobacco abuse.   PAST SURGICAL HISTORY: Significant for appendectomy.   SOCIAL HISTORY: Significant for tobacco use. He lives in an unsupervised facility. He denies any significant drug use or alcohol use.   ALLERGIES: Morphine, Thorazine, and Mellaril.   MEDICATIONS ON ADMISSION:  1. Risperdal 2 mg twice daily. 2. Fluphenazine 25 mg/mL 3 mL injected once a month. 3. Fluoxetine 20 mg twice daily. 4. Benadryl 50 mg daily. 5. Benztropine 2 mg twice daily   PHYSICAL EXAMINATION:   VITAL SIGNS: Vital signs stable.   HEENT: Within normal limits.   CHEST: Clear to auscultation bilaterally.   CARDIOVASCULAR: Regular rate and rhythm.   ABDOMEN: Soft, nontender, nondistended. Slight fullness in the left lower quadrant. No current appreciable CVA tenderness.  EXTREMITIES: Free range of motion x4.   NEUROLOGIC: Motor and sensory grossly intact.    ASSESSMENT:  1. Bilateral nephrolithiasis with left hydronephrosis secondary to obstructing stone.  2. Chronic urinary retention.  3. Chronic cystitis. 4. Retroperitoneal abscess.  RECOMMENDATIONS: This is a very complex situation with this patient due to his mental and social status. He greatly needs input from [employer] as to more supervision of his health care. He does not follow recommendations which will lead to his ultimate demise if no intervention is undertaken. He has been provided with catheters as well as instruction. He does not follow these instructions resulting in chronic urinary retention. This is what has led to his retroperitoneal abscess. He does have large bilateral renal calculi. The current left stone has moved causing obstruction. We have elected to proceed with cystoscopy and left stent placement. He will need likely a percutaneous nephrolithotomy at some point in the near future. He will likely need further intervention regarding the right-sided stones at some point in the future. He does need re-evaluation from General Surgery regarding retroperitoneal abscess. This will not likely resolve spontaneously and will be a source for recurrent sepsis unless completely drained. He will need self intermittent catheterization or chronic Foley catheterization going forward. However, due to the patient's mental and social situation, he does not follow these instructions or leave a Foley catheter in place. He will need a structured facility that can monitor his health care or he will be back in the hospital on multiple occasions and one of these events will likely ultimately end to his demise. Social Services have been contacted according to the primary physicians. We will make further recommendations as time progresses. If there are any further questions, please feel free to contact us.   ____________________________ Denice Bors. Jacqlyn Larsen, MD bsc:drc D: 05/07/2012 08:09:15  ET T: 05/07/2012 09:54:40 ET JOB#: [id number]  cc: Denice Bors. Jacqlyn Larsen, MD, <Dictator> Denice Bors Tyrae Alcoser MD ELECTRONICALLY SIGNED 05/07/2012 21:22

## 2014-09-19 NOTE — Consult Note (Signed)
PATIENT NAME:  Alan Small, Alan Small MR#:  [id number] DATE OF BIRTH:  [birth year]  DATE OF CONSULTATION:  05/06/2012  REFERRING PHYSICIAN:  Prime Doc  CONSULTING PHYSICIAN:  Rodena Goldmann III, MD  PRIMARY CARE PHYSICIAN: None.   CHIEF COMPLAINT: Abdominal pain, nausea.   BRIEF HISTORY: Alan Small is a 59 year old gentleman who was admitted through the Emergency Room with altered mental status and abdominal pain. He was recently admitted to the hospital in October of 2013 with what appeared to be a large perinephric abscess on the right side. Abscess was treated with percutaneous drainage and resolved rapidly. He does have a longstanding history of obstructive uropathy with multiple kidney stones. He had a UPJ stone which was felt to be the source of his obstructive uropathy on the right side. He was catheterized with a Foley catheter and sent home for further evaluation as an outpatient. He did not follow-up with Urology as was recommended. The patient took his Foley out on his own because it was leaking. He presents back to the Emergency Room with confusion, elevated white blood cell count, and left flank pain. Repeat CT scan shows good resolution of the right-sided problem but now a complex mass around the left kidney is suggestive of a possible obstructive uropathy on the left side.   The patient has a longstanding history of chronic kidney disease which has worsened over the last several years. He was seen by the Nephrology service earlier today. He is not in a situation where he needs acute hemodialysis but his renal function is declining dramatically. Surgical service was consulted for further evaluation of the right-sided mass and possible left-sided perinephric abscess.   PAST MEDICAL HISTORY: Past history is significant for psychiatric problems, primarily schizophrenia. He also has history of diverticulitis. He has had previous appendectomy.   MEDICATIONS: His medications are outlined in his  admission note.   FAMILY HISTORY: Noncontributory.   SOCIAL HISTORY: He lives in an assisted living environment. He does not have any skilled nursing available to him. He continues to smoke cigarettes. He does not drink any alcohol at the present time.   PHYSICAL EXAMINATION:   GENERAL: He is an alert, pleasant gentleman in moderate distress from pain.   VITAL SIGNS: Temperature 97.8, heart rate 103, blood pressure 124/84, oxygen saturations 97%.   HEENT: Full bearded gentleman with no facial deformities. No scleral icterus. No pupillary abnormalities.   NECK: Supple and nontender with no adenopathy. His trachea appears to be midline.   CHEST: Clear with no adventitious sounds. He has normal pulmonary excursion.   CARDIAC: No murmurs or gallops to my ear. He seems to be in normal sinus rhythm.   ABDOMEN: His abdomen is mildly distended with marked left flank left upper quadrant pain. He has very minimal right-sided symptoms. His right flank still has a small rounded thickened area which represents previous side of his retroperitoneal drainage. He has no rebound with mild guarding on the left side. He has active bowel sounds.   EXTREMITIES: Lower extremity exam reveals full range of motion, no deformities.   PSYCHIATRIC: Markedly abnormal affect and disorientation.   ASSESSMENT AND PLAN: I have independently reviewed multiple CT scans over the last two months. He did have a large right perinephric abscess drained in October with follow-up CT demonstrating almost complete resolution. The drain was removed. Currently he has no change in the right side on his recent scan but the left side appears to have some increased stranding, increased  fluid collection, expanded kidney suggestive of perinephric abscess or at least obstructive uropathy on the left side.   I think we are looking at history of obstructive uropathy. He needs some sort of definitive treatment with either attempt to remove the  stones or bilateral nephrostomy in an effort to preserve any kidney function which might remain. I agree with the antibiotic therapy. I do not see any indication for general surgical intervention. I recommend evaluation by Urology, antibiotic therapy, and consideration for dialysis if his kidney function continues to deteriorate.   ____________________________ Micheline Maze, MD rle:drc D: 05/06/2012 15:53:33 ET T: 05/06/2012 17:23:13 ET JOB#: [id number]  cc: Rodena Goldmann III, MD, <Dictator> Rodena Goldmann MD ELECTRONICALLY SIGNED 05/14/2012 16:59

## 2014-09-19 NOTE — Consult Note (Signed)
PATIENT NAME:  Alan Small, Alan Small MR#:  [id number] DATE OF BIRTH:  [birth year]  DATE OF CONSULTATION:  03/26/2012  REFERRING PHYSICIAN:   CONSULTING PHYSICIAN:  Denice Bors. Jacqlyn Larsen, MD  HISTORY: [name] is a 59 year old gentleman with a long history of nephrolithiasis and chronic urinary retention. He has been under the care of Dr. Bernardo Heater in the distant past. He has undergone multiple stone procedures. He was referred to [facility] for further evaluation of the stone disease. He underwent several additional procedures at that facility including video urodynamics for evaluation of his chronic bladder dysfunction. He was initially supposed to be on self intermittent catheterization. He has not performed this for many years. He has been able to empty his bladder with very high bladder residuals. He presented to the Emergency Room with a rapidly progressively enlarging right-sided flank mass. A CT scan was obtained in the Emergency Room demonstrating a large fluid collection in the right retroperitoneum extending into the subcutaneous tissue. It was pushing the kidney on the right significantly anterior. There is no distinct area of leakage from the kidney. One would expect significant perinephric stranding if this were directly from the kidney. The radiology report suggested staghorn calculi. These are not consistent with a true staghorn calculus. He does have fairly significant stone disease and large stones. There is no significant obstruction at present. He also has a very large distended bladder with changes consistent with chronic dilation and urinary retention. A Foley catheter was placed. The urine does demonstrate findings consistent with a bladder infection as would be expected with chronic retention. This is likely the main source for the retroperitoneal abscess. He underwent recent CT guided aspiration which did demonstrate purulent drainage. His baseline serum creatinine has been in the 1 range back a  number of years prior. He was in the 2 range in May of this year. There has been no significant interval change. The stones will need eventual intervention. The urinary retention is likely the main factor at present and will likely need continuous bladder drainage with Foley catheter or intermittent catheterization, with his mental status this may be difficult to achieve.   PAST MEDICAL HISTORY:  1. Schizophrenia. 2. Nephrolithiasis. 3. Chronic urinary retention.  4. Prior GI bleed.   PAST SURGICAL HISTORY:  1. Multiple procedures for nephrolithiasis including percutaneous nephrolithotomy. 2. Prior appendectomy.   ALLERGIES: Navane, morphine, Thorazine and Mellaril.   MEDICATIONS ON ADMISSION:  1. Risperidone 2 mg. 2. Fluphenazine 37.5 mg every two weeks. 3. Fluoxetine 20 mg 2 tablets daily. 4. Benadryl 1 tablet at bedtime.  5. Benztropine 2 mg twice daily.   SOCIAL HISTORY: Significant for previous alcohol abuse   PHYSICAL EXAMINATION:  VITAL SIGNS: Vital signs stable.   HEENT: Within normal limits.   CHEST: Clear to auscultation bilaterally.   CARDIOVASCULAR: Regular rate and rhythm.   ABDOMEN: Distended with prominent protrusion in the right flank. Prominent palpable mass in the right flank region.   EXTREMITIES: Free range of motion x4.   NEUROLOGIC: Motor and sensory grossly intact with some dyskinesia present.   ASSESSMENT:  1. Chronic urinary retention.  2. Bilateral nephrolithiasis.  3. Chronic renal failure.  4. Retroperitoneal abscess.   RECOMMENDATION: The chronic urinary retention is likely the source for the retroperitoneal abscess. There does not appear to be a direct communication or leakage of urine from the kidney as the source. One would expect more perinephric stranding for this to be the case. There is no significant hydronephrosis present. His  renal function has remained relatively stable at least since May of this past year. One would also expect  significant increased creatinine if an obstruction and subsequent rupture was the source for the infection. He will need eventual intervention regarding the stones. He will also likely need chronic Foley catheterization or at minimum self intermittent catheterization. This has been addressed in the past and has not been able to be accomplished due to his schizophrenia issues. This is likely to be a recurrent problem in the future. He would likely benefit from Flomax for an attempt at voiding trial at some point in the future. He will need a Foley catheter for at least 10 days. There is no indication for any urgent urological intervention at present. We will continue to follow with you and make recommendations as indicated. If there are any questions, please free to contact us.   ____________________________ Denice Bors. Jacqlyn Larsen, MD bsc:cms D: 03/26/2012 17:14:21 ET T: 03/27/2012 11:57:54 ET JOB#: [id number]  cc: Denice Bors. Jacqlyn Larsen, MD, <Dictator>  Denice Bors Malaisha Silliman MD ELECTRONICALLY SIGNED 03/30/2012 8:25

## 2014-09-19 NOTE — Consult Note (Signed)
Patient seen, chart reviewed, films reviewed, note dictated. nephrolithiasis, new-onset LEFT hydronephrosis, chronic urinary retention, chronic cystitis, retroperitoneal abscess.  Recommendation: Alan Small is well known to our practice.  He has a long history of chronic urinary retention.  He is undergone extensive evaluation including at [facility].  He has a hypocontractile bladder.  He has needed self intermittent catheterization or chronic Foley catheterization for a number of years.  He has not been performing the self catheterization as instructed.  He has done reasonably well until recently.  He developed the initial retroperitoneal abscess with the same bacteria from the bladder.  The abscess was never fully addressed.  He was recently treated for a persistent bladder infection through our office.  A review of self intermittent catheterization and teaching was undertaken.  He was given significant supplies.  He was also set up for home delivery of catheter supplies.  He still has not been performing catheterization.     The current situation demonstrates movement of the stone within the LEFT renal pelvis.  It is now at the UPJ causing obstruction.  We will need to proceed with cystoscopy and LEFT double-J ureteral stent placement.  He is demonstrating no evidence of progressive sepsis despite the elevated white blood cell count and renal function status.  We will therefore proceed with elective stent placement since he was eating at the time of evaluation.  This has been scheduled for tomorrow.  Discussion has been undertaken with the patient regarding the situation.  There are several complex issues which will need to be addressed.       Percutaneous nephrolithotomy would be the most appropriate therapy given the stone size.  He would need a percutaneous nephrostomy.  He would need this for atleast 3 weeks before the procedure could be performed.  With his mental and social situation, the  PCN tube would not last until the procedure.  We will therefore proceed with stent placement.  ESWL would not be a reasonable option based on the stone size and density.  The only other option would be ureteroscopy.  This would be more difficult given the stone size and burden.  We may consider rehospitalization in several weeks for PCN placement and subsequent percutaneous nephrolithotomy.This however may also be a challenge in his current home care situation.     He will need aggressive intervention by social work.  He will not leave in a Foley catheter for chronic Foley catheter drainage.  This has been demonstrated on multiple occasions.  He will not perform the self catheterization.  He will continue to have significant infections and abscess without intervention.  It will be the ultimate likely source of his demise.  He needs to be in a more structured facility or care environment where oversight of his activities can be performed.  Without this, he will likely have multiple recurrent hospitalizations.     As for the abscess, this also should be further addressed with a general surgery consult.  Percutaneous drainage until clearance will likely be needed.  His risk for recurrent sepsis will persist as long as the abscess remains.  Once again, he will have recurrent abscesses if the bladder is not drained appropriately.       There is nothing further that can be offered from a urological perspective from the voiding standpoint. Just leave the Foley catheter for now. We will address the current new onset obstruction related to the LEFT stone.  The RIGHT stone is not causing any  obstruction at present.  This can however change.  These will continue to recur without proper bladder drainage, fluid intake, and diet.  This once again is where monitoring of his health and social situation is of utmost importance to prevent further health issues and hospitalizations. We will make further recommendations as  indicated.  Electronic Signatures: Alan Small (MD)  (Signed on 05-Dec-13 19:42)  Authored  Last Updated: 05-Dec-13 19:42 by Alan Small (MD)

## 2014-09-19 NOTE — Consult Note (Signed)
Chief Complaint:   Subjective/Chief Complaint Urology Consult F/U - POD #1 s/p Left JJ Ureteral Stent  Pt reports much improved left flank pain s/p stent placement. Denies right flank pain. Does endorse thirst.   VITAL SIGNS/ANCILLARY NOTES: **Vital Signs.:   07-Dec-13 21:01   Vital Signs Type Routine   Temperature Temperature (F) 97.7   Celsius 36.5   Temperature Source Oral   Pulse Pulse 87   Respirations Respirations 18   Systolic BP Systolic BP 794   Diastolic BP (mmHg) Diastolic BP (mmHg) 80   Mean BP 96   Pulse Ox % Pulse Ox % 99   Pulse Ox Activity Level  At rest   Oxygen Delivery Room Air/ 21 %  *Intake and Output.:   Daily 07-Dec-13 07:00   Grand Totals Intake:  1894 Output:  4700    Net:  -2806 24 Hr.:  -2806   Oral Intake      In:  480   IV (Primary)      In:  1414   Urine ml     Out:  4700   Length of Stay Totals Intake:  3604 Output:  8350    Net:  -4746    Shift 15:00   Grand Totals Intake:  240 Output:  950    Net:  -710 24 Hr.:  -710   Oral Intake      In:  240   Urine ml     Out:  950   Length of Stay Totals Intake:  3844 Output:  9300    Net:  -5456    Shift 23:00   Grand Totals Intake:  1431 Output:  1800    Net:  -369 24 Hr.:  -1079   Oral Intake      In:  240   IV (Primary)      In:  1191   Urine ml     Out:  1800   Length of Stay Totals Intake:  5275 Output:  11100    Net:  -5825   Brief Assessment:   Respiratory normal resp effort  no use of accessory muscles    Gastrointestinal details normal Soft  Nontender  Nondistended  No masses palpable    Additional Physical Exam No Right CVAT; neg right psoas sign   Lab Results: Hepatic:  06-Dec-13 04:13    Bilirubin, Total 0.2   Alkaline Phosphatase 111   SGPT (ALT) 20   SGOT (AST)  14   Total Protein, Serum  5.7   Albumin, Serum  2.1  Routine Micro:  04-Dec-13 16:10    Organism Name STAPHYLOCOCCUS LUGDUNENSIS   Organism Name GRAM POSITIVE COCCI IN CLUSTERS   Clindamycin  Sensitivity S   Oxacillin Sensitivity S   Ciprofloxacin Sensitivity R   Gentamicin Sensitivity S   Erythromycin Sensitivity S   Lefofloxacin Sensitivity R   Micro Text Report BLOOD CULTURE   ORGANISM 1                STAPHYLOCOCCUS LUGDUNENSIS   COMMENT                   IN AEROBIC AND ANAEROBIC BOTTLES   GRAM STAIN                GRAM POSITIVE COCCI IN CLUSTERS   GRAM STAIN                SEEN IN BOTH AEROBIC AND ANAEROBIC BOTTLES   ANTIBIOTIC  ORG#1    ORG#2     CIPROFLOXACIN                 R                  CLINDAMYCIN                   S                  ERYTHROMYCIN                  S                  GENTAMICIN                    S                LEVOFLOXACIN                  R                  OXACILLIN                     S   Micro Text Report BLOOD CULTURE   ORGANISM 1                STAPHYLOCOCCCUS LUGDUNENSIS   COMMENT                   IN AEROBIC AND ANAEROBIC BOTTLES   COMMENT                   REPEATING SENSITIVITIES   GRAM STAIN                GRAM POSITIVE COCCI IN CLUSTERS   GRAM STAIN                SEEN IN BOTH AEROBIC AND ANAEROBIC BOTTLES   ANTIBIOTIC                        Organism 1 STAPHYLOCOCCUS LUGDUNENSIS   Organism 1 STAPHYLOCOCCCUS LUGDUNENSIS   Culture Comment IN AEROBIC AND ANAEROBIC BOTTLES   Culture Comment IN AEROBIC AND ANAEROBIC BOTTLES   Culture Comment . ID TO FOLLOW SENSITIVITIES TO FOLLOW   Culture Comment . REPEATING SENSITIVITIES   Gram Stain 1 GRAM POSITIVE COCCI IN CLUSTERS   Gram Stain 1 GRAM POSITIVE COCCI IN CLUSTERS   Gram Stain 2 SEEN IN BOTH AEROBIC AND ANAEROBIC BOTTLES   Gram Stain 2 SEEN IN BOTH AEROBIC AND ANAEROBIC BOTTLES   Organism Quantity STAPHYLOCOCCCUS LUGDUNENSIS  Routine Chem:  04-Dec-13 16:10    Result Comment AEROBIC BOTTLE - NOTIFIED OF CRITICAL VALUE  - C/LINDA [facility] 05/06/12 1205 JGF  - READ-BACK PROCESS PERFORMED. anaerobic bottle - CRITICAL VALUE PREVIOUSLY NOTIFIED.  - mpg 05/06/12   Result(s) reported on 08 May 2012 at 07:50AM.   Result Comment AEROBIC BOTTLE - NOTIFIED OF CRITICAL VALUE  - C/LINDA [facility] 05/06/12 1205 JGF  - READ-BACK PROCESS PERFORMED. anaerobic bottle - CRITICAL VALUE PREVIOUSLY NOTIFIED.  - mpg 05/06/12  Result(s) reported on 08 May 2012 at 07:52AM.  05-Dec-13 04:49    Glucose, Serum  103   BUN  82   Creatinine (comp)  4.40   Sodium, Serum  135   Potassium, Serum  5.2   Chloride, Serum  109   CO2, Serum  15  Calcium (Total), Serum 9.0   Anion Gap 11   Osmolality (calc) 295   eGFR (African American)  16   eGFR (Non-African American)  14 (eGFR values <25m/min/1.73 m2 may be an indication of chronic kidney disease (CKD). Calculated eGFR is useful in patients with stable renal function. The eGFR calculation will not be reliable in acutely ill patients when serum creatinine is changing rapidly. It is not useful in  patients on dialysis. The eGFR calculation may not be applicable to patients at the low and high extremes of body sizes, pregnant women, and vegetarians.)   Magnesium, Serum 1.8 (1.8-2.4 THERAPEUTIC RANGE: 4-7 mg/dL TOXIC: > 10 mg/dL  -----------------------)  06-Dec-13 04:13    Glucose, Serum  101   BUN  79   Creatinine (comp)  4.18   Sodium, Serum 137   Potassium, Serum 4.2   Chloride, Serum  110   CO2, Serum  16   Calcium (Total), Serum 8.9   Anion Gap 11   Osmolality (calc) 298   eGFR (African American)  17   eGFR (Non-African American)  15 (eGFR values <668mmin/1.73 m2 may be an indication of chronic kidney disease (CKD). Calculated eGFR is useful in patients with stable renal function. The eGFR calculation will not be reliable in acutely ill patients when serum creatinine is changing rapidly. It is not useful in  patients on dialysis. The eGFR calculation may not be applicable to patients at the low and high extremes of body sizes, pregnant women, and vegetarians.)  07-Dec-13 04:10    Glucose, Serum 77    BUN  57   Creatinine (comp)  2.64   Sodium, Serum 139   Potassium, Serum 4.3   Chloride, Serum  111   CO2, Serum  20   Calcium (Total), Serum 8.6   Anion Gap 8   Osmolality (calc) 292   eGFR (African American)  30   eGFR (Non-African American)  26 (eGFR values <6019min/1.73 m2 may be an indication of chronic kidney disease (CKD). Calculated eGFR is useful in patients with stable renal function. The eGFR calculation will not be reliable in acutely ill patients when serum creatinine is changing rapidly. It is not useful in  patients on dialysis. The eGFR calculation may not be applicable to patients at the low and high extremes of body sizes, pregnant women, and vegetarians.)  Routine Hem:  05-Dec-13 04:49    WBC (CBC)  27.0   RBC (CBC)  4.09   Hemoglobin (CBC)  10.5   Hematocrit (CBC)  32.1   Platelet Count (CBC) 355   MCV  78   MCH  25.8   MCHC 32.9   RDW  19.4   Neutrophil % 93.3   Lymphocyte % 2.7   Monocyte % 3.8   Eosinophil % 0.1   Basophil % 0.1   Neutrophil #  25.2   Lymphocyte #  0.7   Monocyte # 1.0   Eosinophil # 0.0   Basophil # 0.0 (Result(s) reported on 06 May 2012 at 06:04AM.)  06-Dec-13 04:13    WBC (CBC)  15.7   RBC (CBC)  3.52   Hemoglobin (CBC)  8.8   Hematocrit (CBC)  27.2   Platelet Count (CBC) 326   MCV  77   MCH  24.9   MCHC 32.3   RDW  19.7   Neutrophil % 88.6   Lymphocyte % 5.1   Monocyte % 5.4   Eosinophil % 0.7   Basophil % 0.2   Neutrophil #  13.8   Lymphocyte #  0.8   Monocyte # 0.9   Eosinophil # 0.1   Basophil # 0.0 (Result(s) reported on 07 May 2012 at 05:40AM.)  07-Dec-13 04:10    WBC (CBC) 9.3   RBC (CBC)  3.39   Hemoglobin (CBC)  8.6   Hematocrit (CBC)  26.4   Platelet Count (CBC) 366   MCV  78   MCH  25.4   MCHC 32.7   RDW  20.0   Neutrophil % 73.5   Lymphocyte % 15.0   Monocyte % 8.2   Eosinophil % 2.6   Basophil % 0.7   Neutrophil #  6.8   Lymphocyte # 1.4   Monocyte # 0.8   Eosinophil # 0.2    Basophil # 0.1 (Result(s) reported on 08 May 2012 at 05:17AM.)   Assessment/Plan:  Invasive Device Daily Assessment of Necessity:   Does the patient currently have any of the following indwelling devices? foley    Indwelling Urinary Catheter continued, requirement due to    Reason to continue Indwelling Urinary Catheter for strict Intake/Output monitoring for hemodynamic instability  inability to void as documented by bladder scanning  long term catheterization has already been initiated   Assessment/Plan:   Assessment #1. Left Nephrolithiasis with 2.3cm stone obstructing the Left UPJ - POD#1 s/p Left JJ Ureteral Stent -excellent clinical improvement s/p stent placement with improvement in renal function (Cr decreased from 4.18 to 2.64), resolution of leukocytosis (15.7k down to 9.3k), and marked improvement in left flank pain. Very complicated psycho-social situation, per Dr. Bjorn Loser extensive consultation note from 05/06/2012.  #2. Right Nephrolithiasis - clinically asymptomatic  #3. 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 Bjorn Loser extensive consultation note from 05/06/2012. Will require detailed/appropriate discharge planning to prevent recurrent complications/hospitalizations.  #4. Post-obstructive Diuresis - appropriate given the degree of acute on chronic renal insufficiency due to obstructive uropathy (see #1 and #3) without evidence for hemodynamic instability. Thirst mechanism intact.    Plan 1. Long-term management of the pt's b/L nephrolithiasis, per Dr. Jacqlyn Larsen. No new acute recommendations.  2. Continue foley catheterization. Will require detailed/appropriate d/c planning/disposition if recurrent complications/hospital readmissions are to be avoided.  3. Maintain free access to 2 pitchers of cool water at the bedside for prn intake. Monitor hemodynamics closely and consider IV  resuscitation (1/2 mL MIVF per mL UO) IF the UO continues >233m/hr AND the pt develops tachycardia/hypotension due to an inability to keep up via PO intake.  4. Antibiotic therapy per ID.  Will continue to follow with you.   Electronic Signatures: KDarcella Cheshire(MD)  (Signed 07-Dec-13 21:37)  Authored: Chief Complaint, VITAL SIGNS/ANCILLARY NOTES, Brief Assessment, Lab Results, Assessment/Plan   Last Updated: 07-Dec-13 21:37 by KDarcella Cheshire(MD)

## 2014-09-19 NOTE — Consult Note (Signed)
Patient seen, chart review, films reviewed, note dictated Chronic urinary retention, bilateral nephrolithiasis, chronic renal failure, retroperitoneal/flank abscess/fluid collection I have discussed this patient with Dr. Bernardo Heater.  He has been a patient of Dr. Bernardo Heater in the very distant past.  He has undergone multiple procedures for nephrolithiasis.  He also has a history of chronic urinary retention.  He has undergone video urodynamics and other intervention at [facility].  He was supposed to be performing self intermittent catheterization.  It does not appear that this has been done on a regular basis.  Today's CT findings are consistent with chronic urinary retention.  This is likely a factor in the development of his abscess/fluid collection.  He surprisingly has minimal hydronephrosis.  The stones are not consistent with staghorn calculi.  They are significant.  He will need eventual intervention.  Urgent intervention from the stone standpoint is not indicated at present.  His serum creatinine has been elevated at least since May of this year.  It is stable compared to these levels.  His baseline creatinine from 2011 however was much lower.  This is likely some chronic change related to his chronic urinary retention and nephrolithiasis.  Proceed with drainage of the fluid collection.  He will need to keep the Foley catheter at least for now.  He will likely need to be treated for a probable bladder infection.  Today's urine does indicate this finding.  We will await fluid and urine cultures.  We will likely need to push intermittent catheterization or chronic Foley catheterization at the time of discharge.  There is no current urgent indication for stone intervention.  We will follow with you.  We will make further recommendations as indicated.  Please the free to contact us with questions.  Electronic Signatures: Murrell Redden (MD)  (Signed on 25-Oct-13 16:53)  Authored  Last Updated:  25-Oct-13 16:53 by Murrell Redden (MD)

## 2014-09-19 NOTE — Discharge Summary (Signed)
PATIENT NAME:  Alan Small, Alan Small MR#:  [id number] DATE OF BIRTH:  1956/06/02  DATE OF ADMISSION:  03/26/2012 DATE OF DISCHARGE:  04/01/2012  FINAL DIAGNOSES:  1. Right retroperitoneal, perinephric abscess.  2. Chronic renal insufficiency.  3. Chronic schizophrenia, paranoid type.  4. Chronic urinary retention.  5. History of extensive bilateral urinary nephrolithiasis and staghorn calculus formation.   PRINCIPLE PROCEDURES:  1. CT scan of the abdomen and pelvis.  2. Percutaneous drainage of large retroperitoneal, perinephric abscess done in radiology.  3. Urology consultation.   HOSPITAL COURSE SUMMARY: The patient was admitted, found to have a large bulging flank mass. CAT scan was suggestive of an abscess or urine collection. We had telephone consultation at the time with Dr. Jacqlyn Larsen of urology at which point we discussed drainage of the abscess. Dr. Jacqlyn Larsen saw the patient. He assisted in management.   The patient had immediate relief of his discomfort with drainage of a large amount of purulent fluid. Cultures grew out heavy growth of Coag-negative Staphylococcus. CAT scan was repeated several days later and the fluid collection looks to have resolved. The patient was then suitable for discharge home. The drain was removed. This was done on 03/31/2012 and the patient was discharged home with urological follow-up on 04/01/2012.   He was discharged home on the following medications: 1. Flomax 0.4 mg by mouth once a day. 2. Fluoxetine 40 mg by mouth once a day at bedtime. 3. Risperidone 2 mg tablets at bedtime.  4. Fluphenazine 35.5 mg injectable every two weeks. 5. Benztropine 2 mg tablet by mouth twice a day. 6. Benadryl 50 mg by mouth once a day at bedtime.   Call with any questions or concerns to the urology office.  ____________________________ Jeannette How. Marina Gravel, MD mab:slb D: 04/15/2012 10:09:00 ET T: 04/15/2012 12:33:34 ET JOB#: [id number]  cc: Denice Bors. Jacqlyn Larsen, MD Jeannette How Marina Gravel, MD,  <Dictator> Hortencia Conradi MD ELECTRONICALLY SIGNED 04/15/2012 14:16

## 2014-09-19 NOTE — H&P (Signed)
PATIENT NAME:  Alan Small, MOOTHART MR#:  [id number] DATE OF BIRTH:  12-Sep-1955  DATE OF ADMISSION:  05/05/2012  CHIEF COMPLAINT: Altered mental status. Patient is currently disoriented to time, place, and person so it is hard to get any history from him. I called all the numbers available as contacts in patient information in the chart. There is no reply from any of them so I am unable to get any more history. Only information I have available from ER physician that the patient was brought by EMS due to altered mental status. Who called ambulance I don't know and why I don't have more information about review of systems or any further details. Some more information I got from patient's previous records available in our hospital.   HISTORY OF PRESENT ILLNESS: This is a 59 year old male with past medical history as per chart is schizophrenia, nephrolithiasis, anemia, acute GI blood loss, diverticulitis and tobacco abuse. He was admitted to the hospital last month with retroperitoneal fluid collection which was drained by surgery and neurology consult was done for his hydronephrosis and pyelonephritis and they advised him to do in and out catheter on his own. Possibly he might be compliant with that and may be doing that.   REVIEW OF SYSTEMS: Unable to access.   PAST SURGICAL HISTORY:  1. Appendectomy.  2. Gastroduodenoscopy in September 2011.   PAST MEDICAL HISTORY:  1. Schizophrenia. 2. Nephrolithiasis.  3.  profound anemia. 4. Acute GI blood loss. 5. History of diverticulitis.  6. Tobacco abuse.  ALLERGIES: Allergic to morphine, Thorazine and Mellaril.    FAMILY HISTORY: Father deceased, history of pneumonia. Mother deceased due to brain cancer.   SOCIAL HISTORY: Tobacco ongoing, currently smokes 1.5 packs per day as per the previous record; unable to get today. Alcohol: None. Illicit drug use: None.   HOME MEDICATIONS: Reviewed by pharmacy technician today is:  1. Risperdal 2 mg oral tablet  once a day. 2. Fluphenazine 25 mg/mL, 3 mL injectable once a month. 3. Fluoxetine 20 mg oral capsule two times a day.  4. Diphenhydramine 50 mg once a day. 5. Benztropine 2 mg oral 2 times a day.   LABORATORY, DIAGNOSTIC AND RADIOLOGICAL DATA: Glucose 110, BUN 71, creatinine 3.15, sodium 133, potassium 5.2, chloride 107, CO2 14, calcium 9.4, lipase 111, ammonia less than 25, total protein 7.4, albumin 2.9, bilirubin 0.4, alkaline phosphatase 130, SGOT 23, SGPT 33, WBC 27.9, RBC 4.33, hemoglobin 10.8, hematocrit 33.2, platelet count 375, MCV 77. Urinalysis positive with leukocyte esterase 3+, WBC 15 and bacteria 2+. Lactic acid 0.7. Chest x-ray findings suggestive of minimal subsegmental atelectasis in the left perihilar region. Follow up PA and lateral chest x-ray will be of value. CT of the head no acute intracranial process. CT of the abdomen and pelvis: Left kidney is enlarged and markedly heterogenous. This is likely related to hydronephrosis superimposed by chronically scarred kidney. There is a large calculus in the right ureterovesical junction, moderate left perinephric standing is nonspecific. Fluid collection in right posterior abdominal wall extending to abdomen adjacent to the left psoas muscle similar to the prior. Other findings as above.   PHYSICAL EXAMINATION:  VITAL SIGNS: Temperature 97.3, pulse rate 95, respirations 18, blood pressure 152/92 and pulse oximetry 97 on room air.   GENERAL: Patient is alert but disoriented to time, place, and person.   HEAD AND NECK: Atraumatic. He looks older than his actual age and disheveled. Conjunctivae pale. Oral mucosa moist. No signs of head injury. No  drainage from eyes or ears. Pupils bilateral 3 to 4 mm equally reactive to light. No smells from his breathing appreciated.   NECK: No JVD. Neck supple. No lymphadenopathy.   CHEST: Bilateral clear and equal air entry. No creps or rhonchi.   CARDIOVASCULAR: S1, S2 present, regular. No murmur  appreciated.   ABDOMEN: Soft, nontender. Bowel sounds present. No flank pain. No costovertebral angle tenderness appreciated. No abdominal mass palpable. Bowel sounds normal. In the pelvic area superficial fungal infection present.   SKIN: No rashes anywhere else except pelvic region.   LIMBS: No edema.   NEUROLOGICAL: Currently disoriented to time, place, and person. Moves all four limbs. No tremor or rigidity appreciated. Reflexes normal.   PSYCHIATRIC: Totally disoriented and unable to access.   ASSESSMENT AND PLAN:  1. Altered mental status possibly secondary to urinary tract infection due to chronic scarring of the kidney and renal stone. IV Rocephin, urine culture, blood culture, IV fluids. Urine for toxicology to rule out any other cause of altered mental status. CT head negative. Will get surgical and neurology consult as he was recently in the hospital with retroperitoneal fluid and was advised by urology to do in and out catheter maybe that is the cause of having urinary tract infection in this guy.   2. Acute on chronic renal failure. His baseline creatinine was around 2 but right now he presented with creatine more than 3 and BUN level more than 70, possibly due to infection and dehydration as he might be altered mental status for longer time. Will give him IV fluids and monitor tomorrow morning.  3. Hyperkalemia. This is due to renal failure. Will give IV fluids and follow tomorrow for it to come down.   4. Metabolic acidosis. This is secondary to renal failure. Lactic acid is normal so we will follow it after hydration.  5. Schizophrenia. He is taking multiple psych medications. His altered mental status might be effect of these medications. We are not sure at this time. I will call psych consult for further management of those issues.  6. Will check his glucose checks every six hourly to make sure he does not go hypo or hyper and if it is high then will cover with sliding scale  insulin.  7. Currently we are unable to get any further history from this patient and family is unavailable. In future if he becomes more alert and oriented we will get more history or if family comes to the hospital or responds to the phone call then we will try to get more history from him.   TOTAL TIME SPENT: 65 minutes in examining, reviewing old records, discussing with ER physician and reviewing available information.   CODE STATUS: Currently will keep him as FULL CODE as we don't have any information about his wishes.   ____________________________ Ceasar Lund. Anselm Jungling, MD vgv:cms D: 05/05/2012 16:41:41 ET T: 05/05/2012 17:20:05 ET JOB#: [id number]  cc: Ceasar Lund. Anselm Jungling, MD, <Dictator>  Vaughan Basta MD ELECTRONICALLY SIGNED 05/17/2012 22:59

## 2014-09-19 NOTE — Consult Note (Signed)
Impression: 59yo male w/ h/o schizophrenia, right obstructive nephropathy with abscess formation, s/p percutaneous drainage admitted with right obstructive uropathy, s/p ureteral stent placement w/ GNR in the urine and GPC in the blood.  He has significant relief of his pain since the stent was placed.  Will await the identification of the GNR.  I reviewed urology's opinion on options for therapy.  Not many good options to avoid future problems. With the stent, he has hopefully bypassed the obstruction and will allow drainage of the left kidney. I discussed the right sided CT findingsscans with surgery.  He has no right sided pain.  No fever and his WBC is coming downnormal.  He has residual findings on CT.  Discussed with surgery. They feel that this has been adequately drained and that it does not represent an ongoing infectious process. Will need to wait until the [place] in the blood have been identified.  If CNS, then they could be a contaminant.  If Staph aureus, then will need to reconsider the right sided fluid collection as a possible source.   Would continue current broad spectrum antibiotics until the cultures return.   Electronic Signatures: Jatavis Malek, Heinz Knuckles (MD) (Signed on 06-Dec-13 17:16)  Authored   Last Updated: 07-Dec-13 16:00 by Kyrielle Urbanski, Heinz Knuckles (MD)

## 2014-09-19 NOTE — Consult Note (Signed)
Brief Consult Note: Diagnosis: schizophrenia.   Patient was seen by consultant.   Consult note dictated.   Recommend to proceed with surgery or procedure.   Orders entered.   Comments: Psychiatry: Patient seen. Chronic schizophrenia currently in hospital with recurrent infection. To my exam today he is better than on admission but still intermittantly delirious. Can't say how able he will be to do self care later once he is better. Meanwhile he seems to be due for his prolixin d shot per [facility]. Order done for 75mg  IM. Also restart risperdal 2mg  qhs and cogentin 2mg  bid his prior meds. For now can assume he is able to give consent for treatment unless there is new reason to think he cant understand. Will follow.  Electronic Signatures: Clapacs, Madie Reno (MD)  (Signed 05-Dec-13 15:23)  Authored: Brief Consult Note   Last Updated: 05-Dec-13 15:23 by Gonzella Lex (MD)

## 2014-09-19 NOTE — Consult Note (Signed)
Details:    - Psychiatry: Came by to see patient for followup. He was asleep and I did not think it would be worthwhile to wake him up. It looks like his treatment is progressing adequately. I am glad to see that he got the injection of Prolixin Decanoate. I would continue his other medicines as they are currently. Will followup again with him tomorrow.   Electronic Signatures: Gonzella Lex (MD)  (Signed 07-Dec-13 16:08)  Authored: Details   Last Updated: 07-Dec-13 16:08 by Gonzella Lex (MD)

## 2014-09-19 NOTE — Consult Note (Signed)
PATIENT NAME:  Alan Small, Alan Small MR#:  [id number] DATE OF BIRTH:  1955/06/27  DATE OF CONSULTATION:  05/06/2012  REFERRING PHYSICIAN:   CONSULTING PHYSICIAN:  Gonzella Lex, MD  IDENTIFYING INFORMATION AND REASON FOR CONSULT: This is a 59 year old man with a history of schizophrenia. He was brought into the hospital acutely sick with what appears to probably be a complicated urinary tract infection. Consultation is about management of mental illness.   HISTORY OF PRESENT ILLNESS: Information obtained from the patient and the chart. The patient tells me that he has been feeling sick for the last few days, at least since this past Saturday. He particularly says he has been having a lot of pain for the last day in his left side. He does not know who called the ambulance or how he came to be at the hospital but he does know that he is currently at the hospital. He tells me that his mood is usually "terrible" but he cannot be any more specific than that. Denies feeling very sad or depressed. Denies feeling angry. He totally denies any suicidal ideation or wish to die. He admits that he does have hallucinations and paranoia but tells me that he cannot describe them. He says they have been worse in the last few days. He denies that he has been using any alcohol or drugs and claims that he has been compliant with his medicine although he is not able to give me a specific recollection of his current medicine. I have learned that he is followed by [facility] and also he appears to get his Prolixin decanoate shot through [facility].   PAST PSYCHIATRIC HISTORY: Long history of schizophrenia. Multiple psychiatric hospitalizations going back years. Also has had significant recurrent medical problems. For a long time he has been managed outpatient with Prolixin decanoate 75 mg every two weeks plus Risperdal 2 mg at night plus Cogentin and Benadryl and sometimes with antidepressants. It seems to have been some years  since he was last in the hospital. Looks like he was last psychiatrically in the hospital here in 2010. The patient says that he has tried to overdose in the past. Has not had any recent suicide attempts. He is unclear about whether he has been physically aggressive in the past. He apparently has been on multiple medication regimens and the current one seems to have been working as well as anything.   SUBSTANCE ABUSE HISTORY: He has a history of prior abuse of alcohol and occasionally some other drugs but he has evidently cut way down on that. He tells me now that he never drinks at all anymore. In the past he would still drink a couple of beers every now and then but he has stopped that. He denies that he has been abusing any kind of drugs recently.   SOCIAL HISTORY: The patient lives in a boarding house. He is his own guardian. He is followed by the [facility]. His closest living relatives appear to be his sisters but I am not sure if they have been very actively involved with him recently.   PAST MEDICAL HISTORY: The patient is admitted to the hospital this time with acute severe mental status change which appears to be delirium resulting from a recurrent abscess and urinary tract infection. He was in the hospital a couple of months ago with a large abscess which was drained and then he was sent home. It is not clear if he has been  compliant with outpatient treatment since then. He also has a history of anemia and GI bleeds in the past.   CURRENT MEDICATIONS: Not able to get the full list but psychiatrically the last time we do have a record it appears to be Prolixin decanoate 75 mg every two weeks and I have documented that the last time he was at [facility] and got his shot was November 19th. Also, probably still takes risperidone 2 mg at night and Cogentin 2 mg twice a day. It is not clear if he takes any other psychiatric medicine.   ALLERGIES: Allegedly Mellaril, morphine, Navane, and  Thorazine but I suspect those latter are probably side effects more than specific allergy since he does clearly tolerate Risperdal and Prolixin.   REVIEW OF SYSTEMS: He mostly complains of pain in his left flank. It appears to be episodic and coming in spasms. He will writhe intermittently on the bed while I am talking to him. He says his mood feels terrible but he cannot be more specific than that. He endorses recent hallucinations but will not describe them. Denies suicidal ideation. Has recently been having a poor appetite and says he has not been interested in things much recently. Not clear whether he has been losing weight.   MENTAL STATUS EXAM: Disheveled, poorly groomed man interviewed in hospital room. Eye contact is intermittent. Psychomotor activity is writhing as though in pain frequently. Speech is normal in tone when he does speak but he will only speak a few words and maybe a couple of sentences at a time. Thoughts can stay organized for a couple of sentences. He then will drift off and look like he may be almost falling asleep, although he is easily arousable. After drifting off he will usually be confused and have difficulty answering my questions again. He denied acute suicidal or homicidal ideation. Denied hallucinations right at this moment. He said that his mood felt terrible. Affect was blunted and just looked uncomfortable. The patient was unable to recall any of three words at one minute, although he could repeat them all immediately. He was able to tell me where he was and what year it was and basically why he was in the hospital but could not tell me the date. Baseline intelligence probably average but with probably chronic cognitive impairments related to schizophrenia.   ASSESSMENT: This is a gentleman with chronic schizophrenia currently in the hospital with medical illness. Based on the history and physical it appears that he was delirious when he came into the hospital. To my  exam today he is still having intermittent delirium although he is probably much better than he was when he came into the hospital. He is not endorsing suicidal ideation and so far has been compliant and cooperative with treatment here. He is able to describe why he is in the hospital basically. As far as his schizophrenia, he is probably baseline, perhaps a little bit worse. I documented that his last Prolixin decanoate shot was on the 19th which is longer than two weeks ago. He is still intermittently delirious but when he is arousable and can interact he appears to understand things well enough to make reasonable decisions about his care.   TREATMENT PLAN: I'm going to put in an order to get him his Prolixin decanoate 75 mg shot today as he is probably due. Also, restart the Risperdal and Cogentin. I am going to keep trying to get in touch with [facility] Continue to follow-up with the  patient. For now I would recommend that he be considered able to give consent about his own treatment unless some situation arises in which there is reason to question his understanding. Care Management has already discussed with me his discharge planning. Apparently the last time he was here he needed to do in and out of cathing and may not have been compliant with that. He is too sick right now for me to decide whether he is going to be able to comply with outpatient treatment. Given his past history, there is a good chance that he really may not be able to do a great job taking care of medical treatment if anything complicated is required. Care Management may need to work with the [facility] team on trying to refer the patient to a safer environment but that will be something to be decided once he is a little more well.   DIAGNOSIS PRINCIPLE AND PRIMARY:  AXIS I: Schizophrenia, undifferentiated.   SECONDARY DIAGNOSES:  AXIS I: Deferred.   AXIS II: No diagnosis.   AXIS III:  1. Acute pyelonephritis or urinary  tract infection. 2. History of anemia. 3. History of gastric bleeds in the past.   AXIS IV: Moderate to severe from chronic burden of illness and acute illness.   AXIS V: Functioning at time of assessment 30.   ____________________________ Gonzella Lex, MD jtc:drc D: 05/06/2012 15:35:36 ET T: 05/06/2012 16:10:21 ET JOB#: [id number]  cc: Gonzella Lex, MD, <Dictator> Gonzella Lex MD ELECTRONICALLY SIGNED 05/07/2012 0:04

## 2014-09-19 NOTE — Consult Note (Signed)
Course Noted.Reviewed.almost out of flank.  May need to be replaced.UOParound kidney relativly clear with no evidence of leak from [place] likely from the chronic urinary retention.now deflated and thick walled consistant with longstanding obstruction.need cath till early next week.give voiding trial then.likely need I&O cath if patient will do.  Electronic Signatures: Murrell Redden (MD)  (Signed on 29-Oct-13 14:02)  Authored  Last Updated: 29-Oct-13 14:02 by Murrell Redden (MD)

## 2014-09-19 NOTE — Discharge Summary (Signed)
PATIENT NAME:  Alan Small, Alan Small MR#:  [id number] DATE OF BIRTH:  [birth year]  DATE OF ADMISSION:  05/05/2012 DATE OF DISCHARGE:  05/11/2012  For a detailed note, please see the History and Physical done on admission by Dr. Anselm Jungling.    DIAGNOSES AT DISCHARGE:  1. Sepsis secondary to acute pyelonephritis and urinary obstruction. Staph lugdunensis  bacteremia and urinary tract infection.  2. Acute on chronic renal failure.  3. Left-sided nephrolithiasis with hydronephrosis status post ureteral stent placement.  4. Right iliopsoas fluid collection/abscess, which is currently stable.  5. Right-sided hydronephrosis without evidence of urinary obstruction.  6. Schizophrenia.   PROCEDURES DONE DURING THE HOSPITAL COURSE: Status post left ureteral stent placement.   DIET: The patient is being discharged on a regular diet.   ACTIVITY: As tolerated.   FOLLOWUP: Follow up with Dr. Edrick Oh in the next 1 to 2 weeks.   DISCHARGE MEDICATIONS:  1. Diphenhydramine 50 mg at bedtime.  2. Benztropine 2 mg b.i.d.  3. Risperidone 2 mg at bedtime.  4. Fluoxetine 20 mg b.i.d.  5. Fluphenazine decanoate 25 mg/mL,  3-mL intramuscular injection monthly.  6. Dicloxacillin 500 mg q. 6 hours times 10 days.   [place] COURSE:  1. Dr. Legrand Como Blocker, infectious disease. 2. Dr. Alethia Berthold,  psychiatry.  3. Dr. Bronson Ing and Dr. Molly Maduro,  general surgery.  4. Dr. Edrick Oh, urology.   PERTINENT STUDIES DURING HOSPITAL COURSE:  1. CT scan of the head done on admission showing no acute intracranial process.  2. CT scan of the abdomen and pelvis done on admission showing left kidney is enlarged and markedly heterogeneous likely related to hydronephrosis superimposed on a chronically-scarred kidney, large calculus in the right ureteropelvic junction, moderate left perinephric stranding, fluid collection in the right posterior abdominal wall which extends intra-abdominally adjacent  to the left psoas muscle similar to prior.  3. X-ray of the chest done on admission showing findings suggestive of subsegmental atelectasis.  4. Microbiology showing blood cultures positive for Staphylococcus lugdunensis and urine culture also positive for similar.   HOSPITAL COURSE: This is a 59 year old male with medical problems as mentioned above who presented to the hospital on 05/05/2012 secondary to left-sided abdominal pain and also nausea and vomiting, and noted to have CT scan findings suggestive of left-sided urolithiasis with hydronephrosis and suspected acute pyelonephritis.  1. Septic shock: The patient presented to the hospital with sepsis secondary to likely obstructive uropathy and noted to have left-sided hydronephrosis. The patient empirically was started on IV ceftriaxone for his pyelonephritis. Also a urology consult was obtained. The patient has been followed by Dr. Jacqlyn Larsen in the past. The patient's blood cultures grew out Staph lugdunensis along with a urine culture which was the same. This is actually a coagulase-negative staph but likely is the cause of the patient's sepsis. The patient is now status post left-sided ureteral stent placement. He is currently hemodynamically stable, afebrile, white cell count has normalized. He is being discharged on p.o. dicloxacillin for 10 more days, which will be a total of two weeks of therapy post ureteral stent placement.  2. Left-sided hydronephrosis with perinephric stranding: The patient presented with left-sided flank pain. He was noted to have acute pyelonephritis with hydronephrosis. As mentioned, the patient has been followed by Dr. Jacqlyn Larsen in the past. He apparently was supposed to be self-catheterizing himself at home but likely was not doing it. A Foley was placed while he was here in the hospital. He  is currently being discharged with that Foley presently. He also underwent left-sided ureteral stent placement. He is now postoperative day #4.   His creatinine has now come back to baseline. He has no more flank pain and clinically he is much improved. He was also noted to have some right-sided nephrolithiasis and hydronephrosis, although it was not causing any urinary obstruction; therefore, no surgical intervention was done on the right side. The patient needs to further follow up with urology and therefore is being set up for a follow-up appointment with Dr. Jacqlyn Larsen.  3. Staph lugdunensis bacteremia: This was secondary to the urinary obstruction and the cause of the patient's sepsis. Again, the patient was treated empirically with IV ceftriaxone initially and is now switched over to p.o. dicloxacillin and will receive that for the next 10 days to finish a total of two weeks of therapy.  4. Iliopsoas abscess: The patient apparently has a history of a right-sided iliopsoas abscess which has been percutaneously drained in the past. On the CT scan on admission this abscess and fluid collection looked very similar to the previous CT. A surgical consult was obtained. The patient was seen by Dr. Bronson Ing and also Dr. Leanora Cover, who did not think that the patient needed acute surgical intervention for this as it is currently stable. The patient as mentioned is currently afebrile. His white cell count has normalized and he is hemodynamically stable. This can be also further followed up with follow-up imaging as an outpatient.  5. Acute on chronic renal failure: This was likely secondary to urinary obstruction and pyelonephritis. The patient presented to the hospital with a creatinine as high as 3.1, which went up to as high as 4.4. His baseline creatinine is around 2. Status post ureteral stent placement and treatment of the acute pyelonephritis with IV antibiotics, the patient's creatinine is now back down to baseline. The patient's BUN/creatinine likely further needs to be followed.  6. Hyperkalemia: This was likely secondary to the acute on chronic renal  failure. As his renal function improved and as his obstruction was relieved with the ureteral stent, his potassium has now come back to baseline.  7. Acute on chronic anemia: This is likely secondary to anemia of chronic disease from his chronic renal failure. The patient did not require any transfusions. His CBC can be further followed as an outpatient.  8. Schizophrenia: The patient is currently on Prolixin decanoate along with Risperdal and benztropine. A psychiatric consult was obtained. The patient was seen by Dr. Weber Cooks who continued his current medications.  9. The patient currently is a FULL CODE.  He is afebrile, hemodynamically stable, and therefore is being discharged to a skilled nursing facility for ongoing care.   TIME SPENT ON DISCHARGE: 40 minutes.   ____________________________ Belia Heman. Verdell Carmine, MD vjs:bjt D:  05/11/2012 11:52:40 ET          T: 05/11/2012 12:10:39 ET          JOB#: [id number]  cc: Denice Bors. Jacqlyn Larsen, MD Henreitta Leber MD ELECTRONICALLY SIGNED 05/27/2012 14:40

## 2014-09-19 NOTE — Consult Note (Signed)
Chief Complaint:   Subjective/Chief Complaint Urology Consult F/U - POD #2 s/p Left JJ Ureteral Stent  Pt reports only mild (1/10) left flank pain/tenderness with movement s/p stent placement. Denies right flank pain. Does endorse persistent thirst. Has been drinking liberally in response to his thirst.   VITAL SIGNS/ANCILLARY NOTES: **Vital Signs.:   08-Dec-13 13:52   Vital Signs Type Routine   Temperature Temperature (F) 98.2   Celsius 36.7   Temperature Source Oral   Pulse Pulse 98   Respirations Respirations 18   Systolic BP Systolic BP 174   Diastolic BP (mmHg) Diastolic BP (mmHg) 69   Mean BP 82   Pulse Ox % Pulse Ox % 97   Pulse Ox Activity Level  At rest   Oxygen Delivery Room Air/ 21 %  *Intake and Output.:   Daily 08-Dec-13 07:00   Grand Totals Intake:  2891 Output:  4750    Net:  -1859 24 Hr.:  -1859   Oral Intake      In:  480   IV (Primary)      In:  2411   Urine ml     Out:  4750   Length of Stay Totals Intake:  6495 Output:  13100    Net:  -0814    16:37   Grand Totals Intake:   Output:  1100    Net:  -1100 24 Hr.:  -1300   Urine ml     Out:  1100   Urinary Method  Foley   Brief Assessment:   Cardiac 2+ radial pulse    Respiratory normal resp effort  no use of accessory muscles   Lab Results: Routine Chem:  08-Dec-13 04:18    Glucose, Serum 79   BUN  44   Creatinine (comp)  2.27   Sodium, Serum 140   Potassium, Serum 4.4   Chloride, Serum  110   CO2, Serum  20   Calcium (Total), Serum 8.8   Anion Gap 10   Osmolality (calc) 290   eGFR (African American)  36   eGFR (Non-African American)  31 (eGFR values <71m/min/1.73 m2 may be an indication of chronic kidney disease (CKD). Calculated eGFR is useful in patients with stable renal function. The eGFR calculation will not be reliable in acutely ill patients when serum creatinine is changing rapidly. It is not useful in  patients on dialysis. The eGFR calculation may not be applicable to  patients at the low and high extremes of body sizes, pregnant women, and vegetarians.)  Routine Hem:  08-Dec-13 04:18    WBC (CBC) 8.5   RBC (CBC)  3.85   Hemoglobin (CBC)  9.9   Hematocrit (CBC)  29.8   Platelet Count (CBC) 406   MCV  77   MCH  25.8   MCHC 33.3   RDW  19.7   Neutrophil % 73.9   Lymphocyte % 13.4   Monocyte % 8.7   Eosinophil % 2.9   Basophil % 1.1   Neutrophil # 6.3   Lymphocyte # 1.1   Monocyte # 0.7   Eosinophil # 0.3   Basophil # 0.1 (Result(s) reported on 09 May 2012 at 06:00AM.)   Assessment/Plan:  Invasive Device Daily Assessment of Necessity:   Does the patient currently have any of the following indwelling devices? foley    Indwelling Urinary Catheter continued, requirement due to    Reason to continue Indwelling Urinary Catheter for strict Intake/Output monitoring for hemodynamic instability  inability to void  as documented by bladder scanning  long term catheterization has already been initiated   Assessment/Plan:   Assessment #1. Left Nephrolithiasis with 2.3cm stone obstructing the Left UPJ - POD#2 s/p Left JJ Ureteral Stent -continues with excellent clinical improvement s/p stent placement: continued improvement in renal function (Cr decreased from 2.64 to 2.27), continued resolution of leukocytosis (9.3k to 8.5k), and continued resolution of left flank pain (only mild, expected left flank discomfort with activity due to the presence of the stent). Very complicated psycho-social situation, per Dr. Bjorn Loser extensive consultation note from 05/06/2012.  #2. Right Nephrolithiasis - remains clinically asymptomatic  #3. 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 Bjorn Loser extensive consultation note from 05/06/2012. Will require detailed/appropriate discharge planning to prevent recurrent complications/hospitalizations.  #4. Post-obstructive Diuresis -  continues with UO >265m/hr (19035m9.5 hrs, thus far today) - appropriate given the degree of acute on chronic renal insufficiency due to obstructive uropathy (see #1 and #3) without evidence for hemodynamic instability. Thirst mechanism remains intact and, therefore, the pt should be able to maintain an appropriate hydration status as long as free access to water maintained and monitored closely.    Plan 1. Long-term management of the pt's b/L nephrolithiasis, per Dr. CoJacqlyn LarsenStill no new acute recommendations.  2. Continue foley catheterization. Will require detailed/appropriate d/c planning/disposition if recurrent complications/hospital readmissions are to be avoided.  3. Continue to maintain free access to 2 pitchers of cool water at the bedside for prn intake. Continue to monitor hemodynamics closely and consider IV resuscitation (1/2 mL MIVF per mL UO) IF the UO continues >20050mr AND the pt develops tachycardia/hypotension due to an inability to keep up via PO intake.  4. Antibiotic therapy per ID.  5. Would consider changing MIVF to from NS to D51/2NS as there is no sodium deficit. Consider d/c'ing MIVF as this may be  prolonging the diuresis.  Will continue to follow with you. Dr. CopJacqlyn Larsenll resume coverage tomorrow morning.   Electronic Signatures: KimDarcella CheshireD)  (Signed 08-Dec-13 16:55)  Authored: Chief Complaint, VITAL SIGNS/ANCILLARY NOTES, Brief Assessment, Lab Results, Assessment/Plan   Last Updated: 08-Dec-13 16:55 by KimDarcella CheshireD)

## 2014-09-19 NOTE — Consult Note (Signed)
PATIENT NAME:  Alan Small, Alan Small MR#:  [id number] DATE OF BIRTH:  09-Jun-1955  DATE OF CONSULTATION:  05/07/2012  REFERRING PHYSICIAN:  Dr. Verdell Carmine  CONSULTING PHYSICIAN:  Heinz Knuckles. Malania Gawthrop, MD  REASON FOR CONSULTATION: Obstructive nephropathy and gram-positive cocci bacteremia.   HISTORY OF PRESENT ILLNESS: Patient is a 59 year old male with a past history significant for schizophrenia with chronic urinary retention who had developed a right-sided ureteral obstruction and right-sided abscess status post drainage in October 2013 who presented to the Emergency Room on 12/04 with altered mental status. The patient is a relatively poor historian but states that he had been having left-sided flank pain for several days to weeks. He was unable to be more specific in the timeframe. He had been having some fevers and chills as well as general malaise. He denies any nausea, vomiting. He had previously had a percutaneous drain placed in the abscess on the right. This area he states is not hurting him recently. 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 He had blood cultures obtained which are growing gram-positive cocci. His urine is growing a gram-negative rod. None of these have yet been identified. He was started on vancomycin and ceftriaxone. Urology elected to place a ureteral stent today rather than a percutaneous drain due to his issues of compliance. He recently came back from that procedure and has had resolution of his pain.   ALLERGIES: Mellaril, morphine, Navane and Thorazine.   PAST MEDICAL HISTORY:  1. Schizophrenia.  2. Nephrolithiasis with obstructive uropathy and recent abscess on the right which had been percutaneously drained in October 2013.  3. Anemia.  4. Diverticulitis.   FAMILY HISTORY: Positive for pneumonia and brain cancer.   SOCIAL HISTORY: Patient states that he  lives in a group home. He smokes a pack and a half of cigarettes per day. He does not drink. No injecting drug use history.   REVIEW OF SYSTEMS: GENERAL: Positive for fevers, chills, malaise. HEENT: No headaches. No sinus congestion. No sore throat. NECK: No stiffness. No swollen glands. RESPIRATORY: No cough. No shortness of breath. No sputum production. CARDIAC: No chest pains or palpitations. GASTROINTESTINAL: No nausea, no vomiting, no abdominal pain. Some loose stool on occasion. GENITOURINARY: He has had left flank pain. No dysuria. No increased frequency. MUSCULOSKELETAL: General malaise but no frank arthritis. SKIN: No rashes. NEUROLOGIC: He was significantly confused on admission but has apparently improved at the time of my evaluation. All other systems are negative.   PHYSICAL EXAMINATION:  VITAL SIGNS: T-max 98.8, T-current 97.7, pulse 78, blood pressure 108/71, 97% on room air.   GENERAL: 59 year old white man in no acute distress.   HEENT: Normocephalic, atraumatic. Pupils equal, reactive to light. Extraocular motion intact. Sclerae, conjunctivae, and lids are without evidence for emboli or petechiae. Oropharynx shows no erythema or exudate. Teeth and gums are in fair condition.   NECK: Midline trachea. No lymphadenopathy. No thyromegaly.   CHEST: Clear to auscultation bilaterally with good air movement. No focal consolidation.   CARDIAC: Regular rate and rhythm without murmur, rub, or gallop.   ABDOMEN: Soft, nontender, no distention. No hepatosplenomegaly. No hernia is noted. No CVA tenderness.   SKIN: No rashes. No stigmata of endocarditis, specifically no Janeway lesions or Osler nodes.   NEUROLOGIC: The patient was awake and interactive. He was  somewhat difficult historian due to his underlying psychiatric disease  but he is able to answer some questions when queried.   PSYCHIATRIC: Mood and affect appeared normal.   LABORATORY, DIAGNOSTIC AND RADIOLOGICAL DATA: BUN 79,  creatinine 4.18, bicarbonate 16, anion gap 11, AST 14, ALT 20, alkaline phosphatase 111, total bilirubin 0.2, white count 15.7 with hemoglobin 8.8, platelet count 326, ANC 13.8. White count on admission was 27.9. Blood cultures are growing gram-positive cocci. Urine culture is growing a gram-negative rod. Urinalysis had 2+ blood, negative nitrites, 3+ leukocyte esterase, 2 red cells and 15 white cells per high-power field. A chest x-ray showed atelectasis. A CT scan of the head without contrast showed no acute process. CT scan of the abdomen and pelvis without contrast demonstrated the left kidney was enlarged and markedly heterogeneous likely related to hydronephrosis superimposed on chronically scarred kidney. There was a large calculus present in the right UP junction and moderate left perinephric stranding. There is a fluid collection in the right posterior abdominal wall which extends intra-abdominally adjacent to the left psoas muscle.   IMPRESSION: 59 year old man with a history of schizophrenia and right obstructive nephropathy with abscess formation status post percutaneous drain who was admitted with right obstructive uropathy status post ureteral stent placement with gram-negative rods in the urine and gram-positive cocci in the blood.   RECOMMENDATIONS:  1. He has significant relief of his pain since the stent was placed. Will await the identification of the gram-negative rod. I reviewed urology's opinion on the options for therapy. Not many good options to avoid future problems exist.  2. With the stent he is hopefully bypassed the obstruction. Will allow drainage of the left kidney.  3. I discussed the right-sided CT scan results with surgery. He has no right-sided pain. He has no fever and his white count is coming down. He has residual findings that remain on CT scan, however. In discussion with surgery they feel this has been adequately drained and it does not represent an ongoing infectious  process.  4. Will need to wait until the gram-positive cocci in the blood has been identified. If coagulase-negative staph then this could be a contaminant. If Staph aureus then will need to reconsider the right-sided fluid collection as a possible source.  5. Will continue current broad-spectrum antibiotics until all the cultures return.   This is a high-level infectious disease consult. Thank you very much for involving me in Mr. Roadcap's care.   ____________________________ Heinz Knuckles. Chanika Byland, MD meb:cms D: 05/08/2012 16:02:00 ET T: 05/09/2012 09:55:44 ET JOB#: [id number]  cc: Heinz Knuckles. Meghan Tiemann, MD, <Dictator> Mackinzie Vuncannon E Chekesha Behlke MD ELECTRONICALLY SIGNED 05/14/2012 10:18

## 2014-09-22 NOTE — Op Note (Signed)
PATIENT NAME:  Alan Small, Alan Small MR#:  [id number] DATE OF BIRTH:  1956/01/20  DATE OF PROCEDURE:  12/07/2012  PRIMARY DIAGNOSIS: Bilateral nephrolithiasis.   PROCEDURE: Left ureteroscopy with holmium laser lithotripsy, left double-J ureteral stent placement, right nephrostogram.   SURGEON: Edrick Oh, M.D.   ANESTHESIA: General endotracheal anesthesia.   INDICATIONS: The patient is a 59 year old gentleman with multiple health issues. He has a long history of nephrolithiasis. He underwent recent percutaneous nephrolithotomy. He has developed significant stone encrustation on the stent in a very short period of time related to bacteria. He developed obstruction of his ureter. He was transferred to [facility] for nephrostomy tube placement. Bilateral nephrostomy tubes were placed at that time. It was unclear why the right nephrostomy tube was placed as he has demonstrated function on the right in the past. There does not appear to be any change in the stone size or position based on KUB. He presents for ureteroscopy and holmium laser lithotripsy of the large multiple stone encrustation of the left stent with stent removal.   DESCRIPTION OF PROCEDURE: After informed consent was obtained, the patient was taken to the Operating Room and placed in the dorsal lithotomy position under general endotracheal anesthesia. The patient was then prepped and draped in the usual standard fashion. The 76 French rigid cystoscope was introduced into the urethra under direct vision with no urethral abnormalities noted. Moderate bilobar hypertrophy was noted with complete visual obstruction. Upon entering the bladder, inflammatory changes were noted on the posterior bladder wall consistent with Foley catheterization. Mild inflammation was noted surrounding the left ureteral orifice. A stent was noted protruding from the left ureteral orifice with extensive encrustation. Multiple large 1 cm to 1.5 cm stones were noted along the  entire course of the intravesical portion. The holmium laser fiber was then utilized to fragment the stones off of the stent. Once the bladder portion of stones had been removed, a flexible tip Glidewire was introduced into the left ureteral orifice. It was unable to be advanced into the left kidney due to the extent of encrustation. Faint densities could be noted along the entire course of the stent. It was pulled down partially into the proximal ureter. The larger of the stones appeared to be approximately 2 cm in size. The ureteroscope was inserted into the bladder. It was advanced easily into the left ureteral orifice. An attempt was made at passing a guidewire with the assistance of the ureteroscope. This was unsuccessful. The holmium laser fiber was then inserted just inside of the orifice. A large stone encrustation was encountered. This was fragmented off of the stent in a gradual progressive fashion. Visualization was limited due to the large amount of debris related to stone fragmentation and the number of stone fragments. The holmium laser was utilized to fragment the stone as small as possible. This progressed reasonably well to approaching the level of the crossing vessels. Visualization became more difficult as well as angulation to fragment the stones. With persistence this, however, was successful. Multiple larger stones were noted above the level of the crossing vessels including the curl of the stent. This had been completely unencrusted at the time of the recent percutaneous nephrostomy. Prolonged  time was utilized to achieve what appeared to be adequate fragmentation and removal of stones from the stent. After this was completed, the ureteroscope was removed. The cystoscope was replaced back into the urinary bladder. The stent was grasped utilizing grasping forceps. It was unable to be removed due to  continued resistance. The gentle pulling of the stent, however, did help facilitate better  visualization of the mid ureteral section. The ureteroscope was replaced as stated, visualization was improved. The remaining stone portion was noted to be on the posterior aspect of the stent. This extended for several centimeters. This was fragmented free. The scope was advanced to the level of the stent curl. The native natural ureter could be easily identified confirming proper channel passage. A guidewire was advanced through the scope into the upper pole collecting system. The ureteroscope was removed. The cystoscope was replaced. The stent was regrasped. It was easily removed without any further resistance. The ureteroscope was replaced into the ureter. A large amount of debris was encountered. Multiple basket passes were required for removal of the debris and stone fragments. A larger stone fragment was engaged below the level of the crossing vessels. It was unable to be removed. The basket was disassembled. The scope was replaced alongside of the basket. The stone was identified and fragmented into smaller pieces. The basket was then removed. Additional fragments were removed. A large amount of stone fragments remained above the level of the crossing vessels. Visualization demonstrated several remaining larger stone fragments. These were also fragmented as best possible utilizing the holmium laser fiber. Due to the time frame involved of approximately four hours for the initial portion of the procedure the decision was made to proceed with stent replacement. Additional intervention will be required for complete debulking of all of the stones within the ureter. A 6 French x 26 cm double-J ureteral stent was advanced over the guidewire through the cystoscope into the urinary bladder and into the ureter without difficulty. Adequate curl was noted within the renal pelvis. Adequate curl was also noted within the urinary bladder. The bladder was drained. The stone fragments were irrigated free. They were  collected and will be sent for stone analysis. An 51 French Foley catheter was replaced to gravity drainage. A nephrostogram was then performed through the right nephrostomy tube approximately 8 mL of half-strength contrast was injected through the nephrostomy tube. The calyces were noted to fill with contrast; however, no contrast was noted extending beyond the large pelvic stones into the ureter demonstrating complete obstruction. This does indicate a change compared to his previous evaluation. The decision was made to leave the nephrostomy tube in place and proceed with future, percutaneous nephrolithotomy for more definitive treatment of the right stones. He will also need additional treatment for the remaining left stones. Some bleeding was noted through the nephrostomy after the nephrostogram. The patient was returned to the supine position. He was awakened from general endotracheal anesthesia. He was taken to the recovery room in stable condition. There were no problems or complications. The patient tolerated the procedure well.   ____________________________ Denice Bors. Jacqlyn Larsen, MD bsc:dp D: 12/08/2012 09:40:36 ET T: 12/08/2012 11:35:16 ET JOB#: [id number]  cc: Denice Bors. Jacqlyn Larsen, MD, <Dictator> Denice Bors Emmali Karow MD ELECTRONICALLY SIGNED 12/08/2012 14:28

## 2014-09-22 NOTE — Op Note (Signed)
PATIENT NAME:  MACLANE, HOLLORAN MR#:  [id number] DATE OF BIRTH:  1955-12-12  DATE OF PROCEDURE:  10/05/2012  PRINCIPAL DIAGNOSIS: Left nephrolithiasis.   POSTOPERATIVE DIAGNOSIS: Left nephrolithiasis.  PROCEDURE: Left percutaneous nephrolithotomy.   SURGEON: Denice Bors. Jacqlyn Larsen, MD   ANESTHESIA: Laryngeal mask airway anesthesia.   INDICATIONS: The patient is a 59 year old white gentleman with a long history of nephrolithiasis. He was recently hospitalized with left-sided obstruction and hydronephrosis with sepsis. A nephrostomy tube was placed for decompression. He has an approximate 2.5 to 3 cm stone in a lower pole calyx that was originally at the UPJ region. There is a second smaller stone, approximately 1 cm, in a lateral calyx, with an additional 1 to 1.5 cm stone in the upper pole calyx. He presents for percutaneous nephrolithotomy.   DESCRIPTION OF PROCEDURE: After informed consent was obtained, the patient was taken to the operating room and placed in the prone position on the operating table under general endotracheal anesthesia. The patient was then prepped and draped in the usual standard fashion. The previously placed nephrostomy tube was utilized. The string was taken down. The nephrostomy tube was twisted so that the angle was pointing toward the UPJ region. A guidewire was placed through the nephrostomy tube and advanced into the ureter. Some resistance was met at mid ureter; however with continued manipulation, the guidewire was able to be advanced into the urinary bladder alongside a previously placed indwelling stent. The 49 French dilator was then utilized to enlarge the skin tract. The stiffener was then placed over the guidewire. A sheath was then placed. A Super Stiff guidewire was then placed through the sheath. It was advanced to the level of the mid ureter, where once again, some resistance was met. The guidewire was noted to curl back. Multiple attempts were made at advancing the  guidewire into the urinary bladder. This was unsuccessful due to repeated curl back. A new Super Stiff guidewire was utilized and was able to be advanced into the urinary bladder with minimal further difficulty. The sheath was then removed. The stiffener was left in place. Dilation was then undertaken from 60 [place] to 28 [place] utilizing dilators. The 28 French sheath was left in place. It was advanced into the renal pelvic region. Nephroscopy was then performed. The larger stone was easily visible in the lower pole calyx. The CyberWand was then utilized to fragment the stone into multiple small pieces and suctioned free. The more lateral calyx with the second stone was entered. This was also fragmented and evacuated free utilizing the CyberWand. The scope was then advanced into the upper pole calyx. The third stone was encountered. It too was broken and removed utilizing the CyberWand. A few small stone fragments were remaining in several calyces from the larger stone fragmentation. These were removed utilizing the CyberWand. No significant bleeding was encountered. The integrity of the calyces and pelvis was overall relatively good. given his history with chronic urinary retention. Only minimal inflammatory changes were noted from the stent within the renal pelvis. There was no significant encrustation present on the stent. Mild discoloration of the stent was all that was noted. The stent was left in place. A 10 French nephrostomy tube was then advanced through the sheath. Curl was noted within the renal pelvis. Care was taken to avoid interlocking with the stent. The string was secured. The sheath was then removed. It was incised on its outer edge and removed over the nephrostomy tube. The nephrostomy tube was then secured  to the skin utilizing a 0 Prolene suture. Three additional 0 Prolene sutures were utilized to close the skin edges. The guidewires were then removed under fluoroscopic guidance. A  nephrostogram was performed demonstrating good filling of the calyces with no significant extravasation. Contrast was noted extending all the way to the urinary bladder, with no evidence of extravasation. The nephrostomy tube was connected to gravity drainage. The patient was returned to the supine position and awakened from general endotracheal anesthesia. He was taken to the recovery room in stable condition. There were no problems or complications. The patient tolerated the procedure well.   ESTIMATED BLOOD LOSS: Minimal.  ____________________________ Denice Bors. Jacqlyn Larsen, MD bsc:OSi D: 10/05/2012 12:54:46 ET T: 10/05/2012 13:20:03 ET JOB#: [id number]  cc: Denice Bors. Jacqlyn Larsen, MD, <Dictator> Denice Bors [name] MD ELECTRONICALLY SIGNED 10/06/2012 8:09

## 2014-09-22 NOTE — Consult Note (Signed)
Urology Consultation Report: For Consultation: Left Pyelonephritis with b/L nephrolithiasis/hydronephrosis MD: Vilinda Boehringer, M.D.MD: Darcella Cheshire, M.D. Local Urologist: Edrick Oh, M.D. The history is obtained primarily through extensive chart review as the pt is an unreliable historian due to chronic, poorly controlled schizophrenia (disoriented to date and purpose). y.o. WM with a complicated medical, urologic, and social history. He has schizophrenia that interferes with his ability to comply with management of his medical issues. Medically, he also has hypothyroidism, reactive airway disease and chronic renal insufficiency. Urologically, he has significant b/L nephrolithiasis with b/L hydronephrosis (? right UPJO) and chronic bladder hypotonia with chronic urinar retention. He has been non-compliant with both CIC and the maintenance of a chronic indwelling foley. When he is not self-removing the foley, he often does not empty it for several days. Most recently, the pt was admitted to [facility] in 05/2012 with an obstructing large left UPJ stone with UTI/bacteremia (Staph lugdunensis), Acute on CRI, and a resolving right psoas abscess. A left JJ ureteral stent was placed by Dr. Jacqlyn Larsen on 05/07/2012 with subsequent resolution of the acute renal insufficiency and improvement in the UTI.  discharge, atempts to admit the pt to a more supervised level of care have been unsuccessful. The pt presented to the [facility] ER earlier today c/o left flank pain and was found to have a marked leukocytosis (27k) with a low grade fever (100.8F), mild tachycardia (109), acute on CRI (Cr 3.54) with hyperkalemia (6.1) and persistent stable, b/L hydronephrosis on CT with moderate left perinephric stranding. His blood pressure was stable at 151/89. He is admitted for IV hydration and IV antibiotics. the pt denies any flank or abd pain or nausea. He does not recall why he is in the hospital. PSH, FH, SH, ROS all reviewed in Dr. Merian Capron  H&P. Examination:  T (98.3 F), P (89), RR (18), BP (133/86), O2 Sat (95% on RA) WDWN WM in NADwarm, dry; no lesions about the head/neckNC/AT, EOMI, Anicteric, edentulousno masses or bruitsCTA with nL respiratory effortRRR without M/G/R, 2+ Radial Pulses b/L\NT/ND, no CVAT, no palpable masses or organomegaly, NABSnL circumcised phallus with a foley in good position draining light amber urine with scant debris; b/L descended testes - NT, no masses, nL epididymides b/LNT. no edemanon-focalA&Ox2 (person and place - "[facility]", "Emergency Room" - but does not know why he is here nor does he know the date/year - "April"), pleasant and cooperative BUN/Cr (72/3.54), K+ (6.1), CO2 (20), Ca++ (10.4), Alb (3.3), WBC (27.6k), Hct (38.6%), Plt (238k); UA: pH (8.0), SG (1.011), RBC (4948), WBC (747 w/clumps), Bacteria (1+0, Nit (neg) Stone CT (personally reviewed): b/L hydro (L>R) with b/L nephrolithiasis (large stone burden) with left JJ stent in good position, however, with ?encrustation.  UTI -currently hemodynamically stable despite marked leukocytosis with early systemic inflammatory response - no current indication for acute urologic intervention.          -multiple, chronic risk factors (b/L nephrolithiasis with large stone burden b/L; chronic indwelling foley with poor care/maintenance)  Acute on Chronic Renal Insufficiency - probably multifactorial (dehydration, UTI, ?non-functioning left JJ stent with ?encrustation/persistent hydro on CT) Chronic urinary retention - stable, managed with chronic indwelling foley  Agree with IV Hydration and IV antibioticsContinue close hemodynamic monitoringb/L PCN's if the pt does not improve with hydration and IV antibioticsSocial Work ConsultationLong-term f/u and management with Dr. Edrick Oh, who will assume care on Monday morning. follow with you.time spent reviewing records/imaging/H&P: 90 min  Electronic Signatures: Darcella Cheshire (MD)  (Signed on  18-Apr-14  22:03)  Authored  Last Updated: 18-Apr-14 22:03 by Darcella Cheshire (MD)

## 2014-09-22 NOTE — H&P (Signed)
Past Med/Surgical Hx:  Hypothyroidism:   Hepatitis C:   Schizophrenia:   Kidney Stones:   Appendectomy:   ALLERGIES:  Morphine: Itching  Navane: GI Distress  Thorazine: Unknown  Mellaril: Unknown   Assessment/Admission Diagnosis 59 yo Male with PMHx Left pyelonephritis, Schizophreniz, Right Nephrolithasis, In\Out self catherization, now with left flank pain  1. Possible left pyelonephritis - will start IV ceftriaxone, last admission in 05/2012 grew S. Lugdunensis - elevated WBC - will cont with IVFs and antibiotics - ED spoke with on call urology, no acute urological intervention needed at this time - check blood and urine culture - he has a history of left sided hydronephrosis and pyelonephritis s\p antibiotic treatment and stent placement, similar findings seen on today's CT  2. Right sided nephrolithasis - small sub centimeter stones, possible s\p lithotripsy - cont with IVFs - cont with supportive care  3. Leukocytosis - secondary to #1  4. Acute on chronic renal failure - secondary to UTI\pyelonephritis  5. Schizophrenia - cont with home meds, consult psych  GI/DVT prophylaxis - PPI/Heparin  FULL CODE PMD - non Time spent evaluating patient = 55 minutes  RA:[id number]   Electronic Signatures: Vilinda Boehringer (MD)  (Signed 18-Apr-14 17:26)  Authored: PAST MEDICAL/SURGIAL HISTORY, ALLERGIES, HOME MEDICATIONS, ASSESSMENT AND PLAN   Last Updated: 18-Apr-14 17:26 by Vilinda Boehringer (MD)

## 2014-09-22 NOTE — Consult Note (Signed)
Impression:     59yo male w/ h/o schizophrenia, right obstructive nephropathy with abscess formation, s/p percutaneous drainage, s/p left ureteral stent placement w/ Staph lundinensis bacteremia admitted with GPC bacteremia and possibly bilateral ureteral obstruction.  His CT shows bilateral hydronephrosis which may be worsening.  The patient states that his ureteral stent came out, but CT shows it is still present on the left.  Nephrology is concerned that it may be obstructed.          If he has purulence trapped behind an ureteral obstruction, antibiotics are not likely to fix this on their own.  He will likely need drainage.  His WBC is going up.  His BP is stable.  Would consider percutaneous drainage.  Unsure if he would need bilateral procedures.  I would recommend having this done urgently.  Will discuss with prime doc. His BCx are growing GPC.  In 05/2012,4, he had Staph ludinensis, but will need to wait for final culture results.  Would continue coverage for resistant GPC until ID and sensitivities return. Vanco or linezolid are acceptable options for therapy for the GPC.  Continue ceftriaxone until cultures are final.  Will increase dose to 2g daily..        It is unclear if he has had his stent changed since the initial placement.  ? whether stent exchange would be a possible option.    Electronic Signatures: Destani Wamser MPH, Heinz Knuckles (MD) (Signed on 19-Apr-14 15:41)  Authored   Last Updated: 19-Apr-14 15:56 by Christianjames Soule MPH, Heinz Knuckles (MD)

## 2014-09-22 NOTE — H&P (Signed)
PATIENT NAME:  Alan Small, Alan Small MR#:  [id number] DATE OF BIRTH:  1955/12/27  DATE OF ADMISSION:  09/17/2012  EMERGENCY DEPARTMENT REFERRING PHYSICIAN: Dr. Jimmye Norman.   PRIMARY CARE PHYSICIAN: None local.   PRIMARY UROLOGIST: Dr. Jacqlyn Larsen.   ADMITTING PHYSICIAN: Dr. Stevenson Clinch.   HISTORY OF PRESENT ILLNESS: This is a 59 year old Caucasian male with past medical history of schizophrenia, left hydronephrosis, left pyelonephrosis, status post double-J stent placement, history of anemia, GI blood loss, remote history of diverticulitis, tobacco abuse, who has presented with chief complaint of left-sided flank pain. The rest of the history is limited due to the patient's level of schizophrenia and not really responding to questions that well. The patient states that he presented to the ED. He cannot remember how he got here, via EMS or walking, but he has had some left-sided flank pain for a couple of days. In the ED, he was worked up with a CT of the abdomen and pelvis and labs. CT of the abdomen and pelvis showed left-sided hydronephrosis with mild perinephric stranding, some scarring, double-J stent. His white cell count was noted to be 27,000. He had also some right-sided small nephrolithiasis, subcentimeter in size. Given his mild fever of 100.7, his white cell count of 27,000 and findings on the CT scan with a urinalysis concerning for UTI, he is being evaluated by hospitalist for pyelonephritis.   REVIEW OF SYSTEMS:  Remaining review of systems unable to access.   PAST SURGICAL HISTORY: Appendectomy, EGD in September 2011, left kidney stent placement in December 2013.   ALLERGIES: MORPHINE, THORAZINE AND MELLARIL.   PAST MEDICAL HISTORY: Schizophrenia, nephrolithiasis, anemia, GI blood loss in the past, diverticulitis in the past, tobacco abuse, pyelonephritis, left hydronephrosis, right renal calculi, status post multiple lithotripsies.   FAMILY HISTORY: Obtained from the records shows that father  deceased with history of pneumonia. Mother deceased due to brain cancer.   SOCIAL HISTORY: Tobacco ongoing, currently smokes about a pack to 2 packs a day as per the previous records, unable to obtain the actual history as far as the tobacco abuse. Alcohol: None. Illicit drug use: Currently none.   HOME MEDICATIONS: From the chart are as follows: Benadryl 50 mg once at bedtime, Cogentin 2 mg b.i.d., Combivent 103/18 two puffs q.i.d. as needed for shortness of breath, levothyroxine 0.05 mg in the morning, Prolixin 5 mg IM 2 times a week, Prozac 40 mg once a day in the morning, risperidone 3 mg 1 tab at bedtime.   PHYSICAL EXAMINATION:  VITAL SIGNS: In the ED, temperature 100.7, pulse 109, respirations 24, blood pressure 151/89, pulse oximetry 93% on room air.  GENERAL: The patient is alert but disoriented to person, time and place. Well-developed, well-nourished male lying in bed in no acute respiratory distress. He looks older than his actual age, disheveled.   HEENT: Atraumatic. Conjunctivae pale. Oral mucosa moist. No signs of acute head injury. No drainage from the eyes or ears. Pupils bilaterally dilated and equally reactive to light, 3 to 4 mm. No other acute findings.  NECK: No JVD. Neck is supple. No lymphadenopathy.  CHEST: Clear to auscultation bilaterally. No rales, rhonchi, crackles or diminished breath sounds. No use of accessory muscles.  CARDIOVASCULAR: S1, S2 present, regular. No murmurs appreciated.  ABDOMEN: Soft, nontender, nondistended. Positive bowel sounds. Some mild tenderness to deep palpation on the left flank, otherwise, no costovertebral angle tenderness appreciated. No abdominal masses palpable. Bowel sounds are normal. He did have a suprapubic catheter. His scar is clean.  There is no drainage from it. He currently has an indwelling catheter in place.  SKIN: No rashes anywhere. EXTREMITIES: No edema.  NEUROLOGIC: Alert but disoriented to person, time and place. Poor  judgment, poor memory.  LYMPHATIC: No adenopathy noted in the cervical, axilla or supraclavicular regions.   LABORATORY AND RADIOLOGICAL DATA: Sodium 136, potassium 6.1, chloride 20, bicarbonate 107, BUN 72, creatinine 3.54, glucose 111. White cell count 27.6, hemoglobin 12.8, hematocrit 38.6, platelet count 238. He did have a CT of the abdomen and pelvis without contrast that showed there is marked heterogeneity of the left kidney, which is likely related to moderate to severe hydronephrosis on the background of a chronically scarred kidney. There is moderate left perinephric stranding, which is nonspecific. These findings are similar to prior. There is a double-J ureteral stent on the left, which is new from prior CT; correlate for appropriate stent function. There is mild to moderate hydronephrosis of the right kidney, which is similar to slightly increased from prior scan  Right ureterectasis seen; this may be secondary to uteropelvic obstruction of the junction. There are multiple tiny calculi dependently within the bladder. There may be 1 and possibly 2 to 3 mm calculi within the ureter at the right ureterovesical junction. There is thickening of the distal esophageal wall that is nonspecific and may be secondary to esophagitis. There is circumferential bladder wall thickening, which is nonspecific. He had a PA and lateral done that showed no acute abnormality, stable from 05/05/2012. He also had an EKG done that showed no acute ST-T wave changes, sinus tachycardia at a rate of about 110.   ASSESSMENT AND PLAN: This is a 59 year old male with past medical history of left pyelonephritis, schizophrenia, right nephrolithiasis, in-and-out self-catheterization, now with left flank pain.  1.  Possible left pyelonephritis: Will start intravenous ceftriaxone. Last admission in December 2013, he grew out Staphylococcus lugdunensis. He currently has an elevated white cell count. Will treat him at this time with  intravenous fluids and intravenous antibiotics with ceftriaxone as stated above. Dr. Jimmye Norman in the Emergency Department spoke with the on-call urologist, Dr. Maudie Mercury, who stated that there is no urgent urologic intervention needed at this time. He does have some mildly increasing right-sided hydronephrosis. However, he is not septic or decompensating at this time and does not require any further stents or dilation intervention at this time. Continue with medical management.  2.  Right-sided had nephrolithiasis and hydronephrosis: Small subcentimeter stones are noted, possibly status post lithotripsy by Dr. Jacqlyn Larsen. Continue with intravenous fluids, supportive care. The patient is closely followed by Dr. Jacqlyn Larsen in the past.  3.  Leukocytosis secondary to #1.  4.  Acute on chronic renal failure secondary to urinary tract infection and pyelonephritis: Continue antibiotics and avoid nephrotoxic drugs. Continue with intravenous fluids.  5.  Schizophrenia: Continue with home medications. Consult psychiatry to help with management of the intramuscular Prolixin and other intramuscular drugs.  6.  Gastrointestinal and deep vein thrombosis prophylaxis: Proton pump inhibitor, renally dosed heparin.  7.  The patient is a full code.  TIME SPENT: 75 minutes.  ____________________________ Vilinda Boehringer, MD vm:jm D: 09/17/2012 17:23:05 ET T: 09/17/2012 19:14:10 ET JOB#: [id number]  cc: Vilinda Boehringer, MD, <Dictator> Vilinda Boehringer MD ELECTRONICALLY SIGNED 09/19/2012 13:54

## 2014-09-22 NOTE — Discharge Summary (Signed)
PATIENT NAME:  Alan Small, Alan Small MR#:  [id number] DATE OF BIRTH:  10-Apr-1956  DATE OF ADMISSION:  09/17/2012 DATE OF DISCHARGE:  09/22/2012  DISCHARGE DIAGNOSES:  Pyelonephritis, hydronephrosis and renal stones, bacteremia, enterococcus faecalis, systemic inflammatory response on presentation, resolved with treatment, acute on chronic renal failure, altered mental status due to infection, schizophrenia, esophageal thickening, orthostatic hypotension.   CODE STATUS:  FULL CODE.  MEDICATIONS ON DISCHARGE:  Combivent 2 puffs 4 times a day as needed for shortness of breath, risperidone 3 mg oral tablet once a day, Prozac 40 mg oral capsule once a day, Cogentin 2 mg 2 times a day, Prolixin 5 mg intramuscular 2 times a week, levothyroxine 15 mcg once a day in the morning, Zyvox 600 mg oral tablet 2 times a day for 10 days, senna 2 times a day as needed for constipation.   HOME HEALTH ON DISCHARGE:  Yes.   HOME HEALTH SERVICES:  Physical therapy and nurse.   HOME HEALTH INSTRUCTIONS:  Advised to come  for follow-up visits in the clinic, Foley catheter and bag, nephrostomy tube.  Advised to have nephrostomy tube care, flush the tube daily as routine Foley to leg bag.   HOME OXYGEN ON DISCHARGE:  No.   DIET:  Supplement Ensure Plus 2 times a day.  Regular consistency diet.   TIME FRAME TO FOLLOW-UP:  Within 1 to 2 weeks with Dr. Jacqlyn Larsen.  In 2 to 4 weeks with GI clinic with Dr. Vira Agar, 1 to 2 weeks with Dr. Candiss Norse nephrology.  He needs to follow up regularly in clinic with Dr. Jacqlyn Larsen for urology issues.  Follow up in GI for esophageal thickening found on CT of the abdomen and continue to follow up clinic and psychiatrist as he already have one outside.   HISTORY OF PRESENT ILLNESS:  The patient is a 59 year old Caucasian male with past medical history of schizophrenia, left hydronephrosis, left pyelonephritis status post double J stent placement, history of anemia, GI blood loss, diverticulitis, tobacco abuse,  presented with chief complaint of left-sided flank pain, presented to the Emergency Room.  He did not remember how he came to the Emergency Room, but he was complaining of left-sided flank pain for the last few days.  In the ER, he was done CT of the abdomen and pelvis and it showed left-sided hydronephrosis with mild perinephric stranding and some scaling.  White cell count was 27,000, right-sided nephrolithiasis and also had fever 100.7 degrees on presentation.   HOSPITAL COURSE AND STAY:  1.  Altered mental status due to metabolic encephalopathy secondary to renal failure and infection.  We treated him for underlying issues of infection.  He also had some psych issues, but as per the psychiatry consult he was at his baseline.  He had some improvement in his mental status overall after getting treatment for his infection.  2.  Left-sided pyelonephritis.  He was on ceftriaxone.  Urology was following with him and they were waiting for medical response, otherwise they wanted to place a nephrostomy tube which was placed and he was discharged with the tube and draining bag.   3.  SIRS due to pyelonephritis and bacteremia.  This was evident on admission by altered mental status, raised WBC and fever of 100.7 degrees Fahrenheit in the ER with tachycardia, treated with antibiotic and ID consult was called in. 4.  Bacteremia.  He had gram-positive cocci in all four bottles and later on resulted enterococcus faecalis so Dr. Clayborn Bigness advised to discharge him  on linezolid, Zyvox and we discharged him on the same.  5.  Right-sided nephrolithiasis and hydronephrosis.  Medical management as per urologist.  6.  Acute on chronic renal failure secondary to urinary tract infection and pyelonephritis and obstruction.  Continue on antibiotics.  Nephrology consult was called in.  He received some IV fluids and treatment of his pyelonephritis and he had some improvement in his renal failure.  7.  Schizophrenia.  Psychiatry  consult was called in and they suggested to continue the same home medications and advised to follow up with his primary psychiatrist after discharge which we advise the same on discharge.  8.  Acidosis due to renal failure.  He was given IV fluids and followed nephrology consult.  9.  Lower esophageal wall thickening.  He was advised to follow up in GI clinic for this complaint because he did not have any complaints while he was in the hospital.   [place]:  Creatinine was 3.54 on presentation which went up to 4.44 on the next day, but then with IV fluids it came down to 2.35 on discharge.  Potassium level was 6.1 on admission, came down to 4.1 to 4.6 and remained stable around that level.  The patient was slightly acidotic.  CO2 level was 20 on presentation which came down to 17, but later on it came up to 24.  Total WBC count was 27.6 on presentation which went up to 29.7 and came down to 9.3 on discharge.  Hemoglobin level remained stable between 11 and 12.  Urine culture was mixed bacterial flora.  Blood culture was positive for Enterococcus faecalis.  Urinalysis was positive with 747 WBCs.  CT abdomen and pelvis on presentation reported marked heterogenicity in left kidney likely related to moderate to severe hydronephrosis on the account of a chronically scarred kidney, moderate left perinephric stranding, mild to moderate hydronephrosis of the right kidney, multiple tiny calculi within the bladder, circumferential biliary wall thickening, was nonspecific, secondary to known neurogenic bladder.  Other etiologies such as cystitis and malignancy are not excluded.  Thickening of distal esophageal wall.  This is nonspecific and may be secondary to esophagitis.  Malignancy is not excluded.   Total time spent on this discharge is 45 minutes.    ____________________________ Ceasar Lund Anselm Jungling, MD vgv:ea D: 09/25/2012 22:27:01 ET T: 09/26/2012 00:51:31  ET JOB#: [id number]  cc: Ceasar Lund. Anselm Jungling, MD, <Dictator> Murlean Iba, MD Denice Bors. Jacqlyn Larsen, MD Outside Physician  Vaughan Basta MD ELECTRONICALLY SIGNED 10/13/2012 21:57

## 2014-09-22 NOTE — Consult Note (Signed)
PATIENT NAME:  Alan Small, Alan Small MR#:  [id number] DATE OF BIRTH:  Jul 26, 1955  AGE:  59 years  SEX:  Male   RACE:  White  DATE OF ADMISSION:  09/17/2012  DATE OF CONSULTATION:  09/18/2012  PLACE OF DICTATION:  [place], [place], [place], [place]  CONSULTING PHYSICIAN:  Jamie-Lee Galdamez K. Mccoy Testa, MD  The patient was seen in consultation for evaluation of "history of schizophrenia, assist with management of IM medications."  SUBJECTIVE:  The patient is a 59 year old white male, not employed, singe and never married, and lives by himself in an apartment. He has a chief complaint of "having bad pain on the left side."  PAST PSYCHIATRIC HISTORY: History of inpatient psychiatry  many times in the past " Can't remember." history of suicide attempts, "Can't remember."  Being followed by Dr. Loni Muse. Simrun.   ALCOHOL AND DRUGS:  Denied.  MENTAL STATUS:  Alert and oriented. Michela Pitcher it was [place], [place]. Michela Pitcher he was at [facility] Denies feeling depressed, and said "feeling pretty well." Denies auditory or visual hallucinations. Denies any paranoid or suspicious ideas. Denies suicide or homicide plans. Cognition is below average. Insight and judgment guarded.  IMPRESSION:  Schizophrenia, chronic, paranoid, in remission.  RECOMMEND:  The patient is to be continued on the same medications that are recommended by Dr. Ardelle Anton, as he had been stabilized. Discharge patient when medically cleared and stable, and he has appointment with Dr. Loni Muse. Simrun.      ____________________________ Wallace Cullens. Franchot Mimes, MD skc:mr D: 09/18/2012 19:05:00 ET T: 09/18/2012 22:35:58 ET JOB#: [id number]  cc: Arlyn Leak K. Franchot Mimes, MD, <Dictator> Dewain Penning MD ELECTRONICALLY SIGNED 09/19/2012 18:25

## 2014-09-22 NOTE — Op Note (Signed)
PATIENT NAME:  Alan Small, Alan Small MR#:  [id number] DATE OF BIRTH:  1955-12-21  DATE OF PROCEDURE:  12/21/2012  PRINCIPAL DIAGNOSIS: Left ureterolithiasis, right nephrolithiasis.   POSTOPERATIVE DIAGNOSIS: Left ureterolithiasis, right nephrolithiasis, right ureteropelvic junction stricture.   PROCEDURE: Right percutaneous nephrolithotomy, right ureteroscopy with retrograde pyelogram, left ureteroscopy with holmium laser lithotripsy, left double-J ureteral stent placement.   SURGEON: Edrick Oh, M.D.   ANESTHESIA: General endotracheal anesthesia.   INDICATIONS: The patient is a 59 year old gentleman with multiple medical problems and extensive stone disease. He underwent a left percutaneous nephrolithotomy in the recent past. He completely encrusted his stent in a short period of time on the left. He developed recurrent obstruction and sepsis. He was transferred to [facility] for nephrostomy tube placement. Bilateral nephrostomy tubes were placed. He has since undergone left ureteroscopy with fragmentation of a large amount of stone on his stent with stent removal. A new stent was replaced. He has residual stone within the ureter. He presents for removal of the residual stone on the left. He also has several large stones in the right kidney. He presents for right percutaneous nephrolithotomy and attempts at stent placement.   DESCRIPTION OF PROCEDURE: After informed consent was obtained, the patient was taken to the operating room and placed in the prone position on the operating table under general endotracheal anesthesia. The patient was then prepped and draped in the usual standard fashion. Multiple attempts were made at passing a guidewire into the ureter through the indwelling nephrostomy tube. This was unsuccessful. A portion of the guidewire was curled in an upper pole calyx. A sheath was placed over the guidewire. A second superstiff guidewire was also curled into the upper pole calyx as best  possible. The sheath was then removed. The stiffener was placed over the superstiff guidewire. Dilation was then undertaken to 24 [place]. The 42 French sheath was then placed over the dilator. Nephroscopy was then performed. As the scope entered the kidney a large stone was encountered. This appeared to be in the renal pelvic region. It was removed utilizing the ultrasonic probe. An additional smaller stone was noted in the upper pole calyx. An approximately 2 cm stone was also present in a lateral calyx. Visualization of the entire kidney was possible with the rigid nephroscope. The approximate area of the ureteropelvic junction was identified. Multiple attempts were made at passing a guidewire through this area. Both a curved and straight guidewire were utilized without success. Extensive inflammation was present within the kidney. The parenchyma itself appears to be relatively thin after removal of the stone and visual inspection of the remaining kidney. A 10 French nephrostomy tube was placed over the guidewire into the upper pole collecting system. Adequate curl was noted of the nephrostomy tube with tension on the string.  The sheath was removed in the standard fashion. The nephrostomy was secured to the skin utilizing a 0 Prolene suture. Additional 0 Prolene sutures were then placed to complete closure of the nephrostomy site. No significant bleeding was encountered through the procedure. There was one small vessel on the edge of the kidney which was cauterized utilizing a Bugbee electrode. This was prior to nephrostomy tube placement and sheath removal. The patient was returned to the stretcher. He was repositioned on the bed in the dorsal lithotomy position. His indwelling Foley catheter was removed. It was noted that there was no bloody urine in the catheter bag which would be expected if there were patency of the right ureteropelvic junction. The patient  was prepped and draped in the usual standard  fashion. The 49 French rigid cystoscope was introduced into the urethra with no significant urethral abnormalities noted. Minimal prostatic hypertrophy was visualized. The bladder was inspected in its entirety. Mild inflammatory changes were noted on the posterior bladder wall and bladder base consistent with Foley catheterization. Bilateral ureteral orifices were easily identified. A stent was noted protruding from the left ureteral orifice. A moderate amount of debris was present within the bladder from the previous stone fragmentation. This was irrigated free. A guidewire was advanced into the right ureteral orifice. It was advanced into the proximal ureter. There was noted to be some tortuosity of the proximal ureter. The guidewire advanced to close proximity to the curled nephrostomy tube, however, passage into the kidney could not be accomplished. Multiple attempts were made, which were unsuccessful. A retrograde pyelogram was performed through the ureteroscope. It had been advanced to near the proximal ureter at that point. The tortuosity limited direct visualization at the UPJ. A retrograde pyelogram through the scope demonstrated no contrast passage into the kidney. Complete obstruction of the proximal ureter was noted. This is approximately 1 cm in length. The ureteroscope was removed. The cystoscope was replaced back into the urinary bladder. A guidewire was advanced into the left ureteral orifice alongside the stent. This was easily advanced into the upper pole collecting system. Grasping forceps were then utilized to remove the indwelling stent. The cystoscope was removed. The 6 French rigid ureteroscope was advanced into the urinary bladder. It was easily advanced into the dilated left ureter. A significant amount of debris and multiple stone fragments were encountered. These were fragmented further utilizing the holmium laser fiber. Multiple stones were noted throughout the mid ureteral region. The  largest was approximately 1 cm to 1.5 cm in length. Once the stones were adequately fragmented, the basket was then utilized to remove the bulk of the more solid pieces. A moderate amount of the softer debris remained. The scope was advanced to the level of the ureteropelvic junction with no additional abnormalities appreciated. Due to the amount of retained soft debris, a stent was utilized. The ureteroscope was removed. The cystoscope was replaced, back loaded over the guidewire. A 6 French x 24 cm double-J ureteral stent was advanced over the guidewire into the upper pole collecting system under fluoroscopic guidance without difficulty. Adequate curl was also noted within the urinary bladder. The bladder was then irrigated free of residual stone fragments. The cystoscope was removed. An 84 French Foley catheter was replaced to gravity drainage. The patient was returned to the supine position and awakened from general endotracheal anesthesia. He was taken to the recovery room in stable condition. There were no problems or complications. The patient tolerated the procedure well.   ESTIMATED BLOOD LOSS: Minimal.    ____________________________ Denice Bors. Jacqlyn Larsen, MD bsc:dp D: 12/21/2012 14:21:11 ET T: 12/21/2012 15:01:48 ET JOB#: [id number]  cc: Denice Bors. Jacqlyn Larsen, MD, <Dictator> Denice Bors Saurabh Hettich MD ELECTRONICALLY SIGNED 12/23/2012 8:19

## 2014-09-22 NOTE — Op Note (Signed)
PATIENT NAME:  Alan Small, Alan Small MR#:  [id number] DATE OF BIRTH:  December 16, 1955  DATE OF PROCEDURE:  10/05/2012  PREOPERATIVE DIAGNOSIS: Left nephrolithiasis.   POSTOPERATIVE DIAGNOSIS: Left nephrolithiasis.    PROCEDURE: Left percutaneous nephrolithotomy.   SURGEON: Edrick Oh, MD.   ANESTHESIA: General endotracheal anesthesia.   INDICATIONS: The patient is a 59 year old gentleman with a history of bilateral renal calculi. He underwent recent cystoscopy and stent placement for a large left renal calculi with obstruction. He underwent ESWL to a bulk of stone. There is significant residual stone within the renal pelvis causing recurrent recent obstruction and hospitalization with sepsis. A nephrostomy tube was placed. He presents for percutaneous nephrolithotomy.   DESCRIPTION OF PROCEDURE: After informed consent was obtained, the patient was taken to the operating room and placed in the prone position on the operating table under general endotracheal anesthesia. The patient was then prepped and draped in the usual standard fashion. The previously placed indwelling left percutaneous nephrostomy tube was utilized. A guidewire was advanced through the tube. The string was disconnected. The angle was rotated such that a guidewire could be advanced down the ureter. Some resistance was met in the mid ureter; however, with continued attempts the guidewire was successfully navigated into the urinary bladder. The nephrostomy tube was then removed. The 12-French dilator was utilized over the guidewire. The stiffener and sheath was then placed. The superstiff guidewire was advanced through the sheath to the level of the mid ureter. A curl back was encountered. This continued to curl back with multiple attempts. A new guidewire was utilized with subsequent advancement into the urinary bladder. The sheath was removed. The stiffener was left in place. The first flexible Glidewire was secured to the drapes. The superstiff  guidewire was utilized for dilation. A 1 cm skin incision was made. Dilation was then undertaken utilizing the 12-French dilator to 28-French. The 28-French sheath was then left in place. Once the sheath was in place, nephroscopy was performed. Upon entering the renal pelvis, a large stone was noted within the lower pole calyx. This was removed utilizing the CyberWand. An additional stone was noted in a more lateral calyx. A smaller stone was present in an upper pole calyx. The total stone size was approximately 4 to 5 cm aggregate.   ____________________________ Denice Bors. Jacqlyn Larsen, MD bsc:aw D: 10/05/2012 12:49:01 ET T: 10/05/2012 12:59:25 ET JOB#: [id number]  cc: Denice Bors. Jacqlyn Larsen, MD, <Dictator> Denice Bors Bradrick Kamau MD ELECTRONICALLY SIGNED 10/06/2012 8:09

## 2014-09-22 NOTE — Consult Note (Signed)
PATIENT NAME:  Alan Small, ETUE MR#:  [id number] DATE OF BIRTH:  November 07, 1955  DATE OF CONSULTATION:  09/18/2012  REFERRING PHYSICIAN:  Dr. Anselm Jungling.  CONSULTING PHYSICIAN:  Heinz Knuckles. Avien Taha, MD  REASON FOR CONSULTATION:  Possible obstructive uropathy with gram-positive cocci bacteremia.   HISTORY OF PRESENT ILLNESS:  The patient is a 59 year old male with a past history significant for schizophrenia, a right obstructive neuropathy with abscess formation status post percutaneous drainage, status post left ureteral stent placement with obstruction and staphylomatous bacteremia who was admitted on April 18 with pain in the left side. The patient is extremely difficult to get a history from. He has schizophrenia and while he is awake and alert currently, he remains confused. He kept stating to me that he was recently in rehab but could not really tell why he came to the hospital. The H and P also indicates that he could not recall how or why he presented to the Emergency Room. The only note is that he had some left side pain for a few days. He does not know if he has had fevers, chills, or sweats. He cannot tell if he has had any nausea or vomiting or any change in his bowels. He does not know if he has had any change in his urine. He was noted to have leukocytosis and had a CT scan which showed possible worsening of his bilateral ureteral obstruction. He has a left renal stent in place but there was some question of whether the stent was actually functioning. His temperature in Emergency Room was 100.7. Since admission he has had positive blood cultures with gram-positive cocci and has been started on linezolid and ceftriaxone.   ALLERGIES:  MELLARIL, MORPHINE, NAVANE, and THORAZINE.   PAST MEDICAL HISTORY:  1.  Schizophrenia.  2.  Nephrolithiasis with obstructive uropathy with an abscess on the right which had been percutaneously drained in 03/2012 and obstruction on the left in 05/2012, status post a  urostomy tube placement. The patient does not recall if he has had the left internal tube changed since it was placed in December.  3.  Anemia.  4.  Diverticulitis.   FAMILY HISTORY:  Positive for pneumonia and brain cancer.   SOCIAL HISTORY:  The patient states that he has recently been in rehab. He smokes a pack and a half of cigarettes per day. He does not drink. No injecting drug use history.   REVIEW OF SYSTEMS:  Unable to obtain from the patient due to his confusion.   PHYSICAL EXAMINATION:  VITAL SIGNS:  T-max of 100.71, T-current of 98.4, pulse 95, blood pressure 110/69, 98% on room air.  GENERAL:  A 59 year old white man in no acute distress.  HEENT:  Normocephalic, atraumatic. Pupils equal, reactive to light. Extraocular motion intact. Sclerae, conjunctivae, and lids are without evidence for emboli or petechiae. Oropharynx shows no erythema or exudate. Teeth and gums were in fairly good condition.  NECK:  Supple. Full range of motion. Midline trachea. No lymphadenopathy. No thyromegaly.  LUNGS:  Clear to auscultation bilaterally with good air movement and no focal consolidation.  HEART:  Regular rate and rhythm without murmur, rub, or gallop.  ABDOMEN:  Soft, nontender, nondistended. No hepatosplenomegaly. No hernias noted. No CVA tenderness.  EXTREMITIES:  No evidence for tenosynovitis.  SKIN:  No rashes. No stigmata of endocarditis, specifically, no Janeway lesions or Osler nodes.  NEUROLOGIC:  The patient is awake and interactive, moving all 4 extremities.  PSYCHIATRIC:  The patient was  awake and talking. He did not appear to have any delusions or hallucinations. He did not appear to be responding to internal stimuli. However, he remained confused and was unable to provide a significant history of the events leading up to his hospitalization.   LABORATORY DATA:  BUN of 78, creatinine 4.44, bicarbonate of 17, anion gap of 12. LFTs were unremarkable on admission. White count of 29.7  with a hemoglobin of 11.2, platelet count of 199, ANC of 27.1. White count on admission was 27.6. Urinalysis had 3+ blood, greater than 500 protein, negative nitrites, 3+ leukocyte esterase, 4948 red cells, 747 white cells per high-powered field. Urine culture is growing mixed bacterial flora. Blood cultures are growing gram-positive cocci in chains and pairs in 4/4 bottles. A chest x-ray showed no infiltrates. A CT scan showed marked abnormality of the left kidney, likely related to moderate to severe hydronephrosis with some chronic scarring. There is moderate left perinephric stranding which was nonspecific. These findings were no significantly different from prior CT scan. there was a double-J ureteral stent in place on the left. There is mild to moderate hydronephrosis of the right kidney which was similar to slightly increased from the previous. There were multiple tiny calculi within the bladder as well as 1 or 2 within the ureter on the right upper at the right ureterovesical junction. There was circumferential bladder wall thickening as well as thickening of the distal esophagus was noted.   IMPRESSION:  A 59 year old male with history of schizophrenia, right obstructive nephropathy with abscess formation status post percutaneous drainage in 03/2012 status post left ureteral stent placement in 05/2012 due to obstruction with a staphylomatous bacteremia who is admitted with gram-positive cocci bacteremia and possible bilateral ureteral obstruction.   RECOMMENDATIONS:  1.  His CT shows bilateral hydronephrosis, which may be worsening. The patient states that his ureteral stent came out but a CT scan shows it is still present on the left. Nephrology is concerned that it may be obstructed.  2.  If he has purulence trapped behind a ureteral obstruction, antibiotics are not likely to fix this on their own. He will likely need drainage. His white count is going up since admission. His blood pressure has been  stable. We would recommend having this done urgently if his white count continues to rise. This was discussed with the prime doctor physician.  3.  His blood cultures are growing gram-positive cocci. In 05/2012, he had staphylomatous in the blood but we will need to wait for the final blood cultures. Would continue coverage for resistant gram-positive cocci until the ID and sensitivities return.  4.  Vancomycin or linezolid are acceptable options for therapy for the gram-positive cocci. Would continue ceftriaxone until the cultures are final. We will increase the dose to 2 grams daily given his age.  5.  It is unclear if he has had the stent changed since the initial placement. Question whether a stent exchange would be a possible option for therapy.   This is a high-level Infectious Disease consult. Thank you very much for involving me in this patient's care.   ____________________________ Heinz Knuckles. Brailon Don, MD meb:jm D: 09/18/2012 15:57:04 ET T: 09/18/2012 16:15:33 ET JOB#: [id number]  cc: Heinz Knuckles. Aaliayah Miao, MD, <Dictator> Benedict Kue E Nica Friske MD ELECTRONICALLY SIGNED 09/18/2012 16:45

## 2014-09-22 NOTE — Consult Note (Signed)
Aware of patient's admission.  Per routine weekend coverage protocol, care during this hospitalization should default to Dr. Rogers Blocker.  Since I am familiar with his care, I will monitor during his hospitalization.  He has been scheduled for follow-up on numerous times in the office for stent removal.  He has failed to show or there were transportation issues with each occurrence.  There does appear to be some stent encrustation.  This is likely a factor with his recent sepsis.  He also needs a chronic Foley catheter or self intermittent catheterization.  If he does not follow this recommendation, he will develop significant hydronephrosis and sepsis once again as well as increased risk for abscess as previous.  There is no other option due to underlying bladder dysfunction.  Since he now has a nephrostomy tube in place, we will need to give him approximately 10-14 days of antibiotic coverage before proceeding with any intervention.  We can utilize the nephrostomy tube to proceed with percutaneous nephrolithotomy to completely clean his left kidney.  We will plan on stent removal at that time.  No further urological intervention is indicated at present.  If there are any questions, please free to contact us.  As stated on multiple prior hospitalizations, this patient needs more monitored care.  He is not capable of self management.  He does acknowledge increased episodes of "blacking out".  He requested a helmet or some type of device to help prevent head injury during these episodes.  This is also a reason he should not be left on his own accord.  Electronic Signatures: Murrell Redden (MD)  (Signed on 22-Apr-14 10:34)  Authored  Last Updated: 22-Apr-14 10:34 by Murrell Redden (MD)

## 2014-09-23 NOTE — Discharge Summary (Signed)
PATIENT NAME:  Alan Small, Alan Small MR#:  [id number] DATE OF BIRTH:  1956/04/14  DATE OF ADMISSION:  12/05/2013 DATE OF DISCHARGE:  12/07/2013  REASON FOR ADMISSION: Syncopal episode.  HISTORY OF PRESENT ILLNESS: A 58 year old gentleman with history of schizophrenia, nephrolithiasis,  in the past has had multiple surgeries and now requires a Foley catheter.   He was admitted to Emergency Department, due to a syncopal episode. The patient started feeling dizzy, at the gas station. The patient went out of his group home for lunch, felt dizzy in a gas station, and had a near syncopal episode.   In the Emergency Department, he was evaluated and his blood pressure was 80s over 40s. After a liter of fluids, his blood pressure improved to 143/70.  PROGRESS OF PROBLEMS:  1. Syncopal episode. The patient likely was orthostatic, became hypotensive, dizzy, and lightheaded. This could be also related to a urinary tract infection as the patient has an indwelling catheter. IV fluids were healing and the patient started to feel very well. By the time that I saw him on day number 2 the patient was feeling great. The patient has chronic kidney disease and his creatinine was around 0.5 but his baseline is 2.4 to 2.7. 2. Cardiac enzymes were done and they were negative. White count was 11,000, initially then 10,000, and his platelet count was 153,000. 3. His urine culture showed mostly no growth. 4. The patient was given IV fluids, treated empirically with antibiotics and discharged home in good condition.   DISCHARGE MEDICATIONS:  1. Ceftin 250 mg twice daily for 5 days.  2. Hydrocortisone application rectally. 3. Advair Diskus 1 puff twice daily. 4. MiraLax once a day.  5. Sodium bicarbonate 650 mg 2 times a day.  6. Levothyroxine 50 mcg once a day. 7. (benadryl as needed for insomnia. 8. Cogentin 2 mg 3 times a day. 9. Prozac 20 mg once a day. 10. Risperidone 20 mg at bedtime. 11. Combivent inhaler 4 times  daily.  The patient is discharged in good condition.  DISCHARGE DIAGNOSES: Dehydration, intramuscular volume depletion, syncopal episode with orthostatic hypotension, also sepsis, chronic kidney disease stage III, chronic obstructive pulmonary disease, hyperthyroidism. discharge time 35 min  ____________________________ [place] Sink, MD rsg:lt D: 12/10/2013 22:39:13 ET T: 12/11/2013 05:31:19 ET JOB#: [id number]  cc: Marathon Sink, MD, <Dictator> Mykell Genao America Brown MD ELECTRONICALLY SIGNED 12/15/2013 23:01

## 2014-09-23 NOTE — H&P (Signed)
PATIENT NAME:  Alan Small, Alan Small MR#:  [id number] DATE OF BIRTH:  1955/10/28  DATE OF ADMISSION:  12/05/2013  PRIMARY CARE PHYSICIAN:  At [facility].   CHIEF COMPLAINT: Syncopal episode.   HISTORY OF PRESENT ILLNESS:  Alan Small is a 59 year old Caucasian gentleman with history of chronic schizophrenia, history of nephrolithiasis, ureterolithiasis requiring multiple surgeries in the past including polymicrobial urinary tract infections, several in the past, with indwelling Foley catheter, history of hypothyroidism, comes to the Emergency Room after he had gone out of the group home to have lunch. The patient felt dizzy at the gas station and felt almost near syncopal episode, came to the Emergency Room where he was found to have blood pressure of 80/48. He received a liter of IV bolus fluid and his blood pressure came up to 143/70. The patient was found to have significant urinary tract infection. He has an indwelling Foley catheter. Internal medicine contacted for further evaluation and management. The patient is being admitted for possible sepsis, urinary tract infection secondary to catheter-related urinary tract infection along with hypotension and dehydration.   PAST MEDICAL HISTORY:   1.  Chronic indwelling Foley catheter. Patient's primary urologist is Dr. Jacqlyn Larsen.  2.  Hypothyroidism.  3.  Hepatitis C. 4.  Chronic schizophrenia.  5.  History of nephrolithiasis, multiple surgeries including lithotripsies and nephrostomy done. The patient also states he has had kidney removal although he does not know which side and does not remember the details for it.   ALLERGIES: TO MELLARIL, MORPHINE, NAVANE, THORAZINE.   MEDICATIONS: The patient's medication reconciliation has not been done yet, pharmacy tech is working on it.   SOCIAL HISTORY: The patient is from a group home. Denies any history of smoking. No alcohol use.   REVIEW OF SYSTEMS:    CONSTITUTIONAL: No fever. Positive for fatigue,  weakness.  EYES: No blurred or double vision, glaucoma, cataracts.  ENT: No tinnitus, ear pain, hearing loss.  RESPIRATORY: No cough wheeze, hemoptysis, dyspnea.  CARDIOVASCULAR: No chest pain, orthopnea, edema, palpitations.  GASTROINTESTINAL: No nausea, vomiting, diarrhea, abdominal pain. No GERD.  GENITOURINARY: Positive for chronic Foley. No dysuria or hematuria.  ENDOCRINE: No polyuria, nocturia or thyroid problems.  HEMATOLOGY: No anemia, easy bruising or bleeding.  SKIN: No acne, rash.  MUSCULOSKELETAL: Positive for arthritis.  NEUROLOGIC: No cerebrovascular accident, transient ischemic attack. Positive for weakness.  PSYCHIATRIC: Positive for schizophrenia.  All other systems reviewed and negative.   LABORATORY, DIAGNOSTIC AND RADIOLOGICAL DATA:   1.  Ammonia level less than 10.  2.  Chest x-ray no acute cardiopulmonary abnormality.  3.  Urinalysis positive for urinary tract infection.  4.  BUN is 38, creatinine 2.50, sodium 139, chloride is 111. Albumin is 3.1.  5.  White count is 11.0, hemoglobin and hematocrit is 12.5 and 37.2.  6.  Troponin is 0.02.  7.  EKG showed normal sinus rhythm.   ASSESSMENT: A 59 year old, Alan Small, with history of nephrolithiasis requiring multiple surgeries and nephrostomy tubes in the past history of chronic schizophrenia, comes in with:  1.  Syncopal/presyncopal episode with orthostatic hypotension, dizziness and possible sepsis related to urinary tract infection, recurrent in the setting of indwelling chronic Foley catheter. The patient is going to be admitted on the medical floor.  Will continue IV fluids for hydration. Blood pressure is much better. Will continue IV Rocephin. Follow blood culture, urine culture and change antibiotics accordingly.  May consider urology consultation if needed.  2.  Mild leukocytosis secondary  to urinary tract infection.  3.  Acute on chronic kidney disease stage 3 to 4. Will continue IV fluids for  hydration. Baseline is around 2.3 creatinine.  4.  History of chronic schizophrenia. Will continue his home psych meds once medication reconciliation has been completed.   5.  Deep vein thrombosis prophylaxis, subcutaneous heparin.  6.  Care management for discharge planning.  7.  Physical therapy for gait ambulation and safety.  8.  The patient is a full code.   TIME SPENT: 50 minutes. No family members in the Emergency Room.     ____________________________ Carl Best, MD mge:cs D: 12/05/2013 15:49:01 ET T: 12/05/2013 16:17:05 ET JOB#: [id number]  cc: Carl Best, MD, <Dictator>

## 2014-09-23 NOTE — H&P (Signed)
PATIENT NAME:  Alan Small, EIGNER MR#:  [id number] DATE OF BIRTH:  1956-01-10  DATE OF ADMISSION:  12/05/2013  PRIMARY CARE PHYSICIAN:  At [facility].   CHIEF COMPLAINT: Syncopal episode.   HISTORY OF PRESENT ILLNESS:  Alan Small is a 59 year old Caucasian gentleman with history of chronic schizophrenia, history of nephrolithiasis, ureterolithiasis requiring multiple surgeries in the past including polymicrobial urinary tract infections, several in the past, with indwelling Foley catheter, history of hypothyroidism, comes to the Emergency Room after he had gone out of the group home to have lunch. The patient felt dizzy at the gas station and felt almost near syncopal episode, came to the Emergency Room where he was found to have blood pressure of 80/48. He received a liter of IV bolus fluid and his blood pressure came up to 143/70. The patient was found to have significant urinary tract infection. He has an indwelling Foley catheter. Internal medicine contacted for further evaluation and management. The patient is being admitted for possible sepsis, urinary tract infection secondary to catheter-related urinary tract infection along with hypotension and dehydration.   PAST MEDICAL HISTORY:   1.  Chronic indwelling Foley catheter. Patient's primary urologist is Dr. Jacqlyn Larsen.  2.  Hypothyroidism.  3.  Hepatitis C. 4.  Chronic schizophrenia.  5.  History of nephrolithiasis, multiple surgeries including lithotripsies and nephrostomy done. The patient also states he has had kidney removal although he does not know which side and does not remember the details for it.   ALLERGIES: TO MELLARIL, MORPHINE, NAVANE, THORAZINE.   MEDICATIONS: The patient's medication reconciliation has not been done yet, pharmacy tech is working on it.   SOCIAL HISTORY: The patient is from a group home. Denies any history of smoking. No alcohol use.   REVIEW OF SYSTEMS:    CONSTITUTIONAL: No fever. Positive for fatigue,  weakness.  EYES: No blurred or double vision, glaucoma, cataracts.  ENT: No tinnitus, ear pain, hearing loss.  RESPIRATORY: No cough wheeze, hemoptysis, dyspnea.  CARDIOVASCULAR: No chest pain, orthopnea, edema, palpitations.  GASTROINTESTINAL: No nausea, vomiting, diarrhea, abdominal pain. No GERD.  GENITOURINARY: Positive for chronic Foley. No dysuria or hematuria.  ENDOCRINE: No polyuria, nocturia or thyroid problems.  HEMATOLOGY: No anemia, easy bruising or bleeding.  SKIN: No acne, rash.  MUSCULOSKELETAL: Positive for arthritis.  NEUROLOGIC: No cerebrovascular accident, transient ischemic attack. Positive for weakness.  PSYCHIATRIC: Positive for schizophrenia.  All other systems reviewed and negative.   LABORATORY, DIAGNOSTIC AND RADIOLOGICAL DATA:   1.  Ammonia level less than 10.  2.  Chest x-ray no acute cardiopulmonary abnormality.  3.  Urinalysis positive for urinary tract infection.  4.  BUN is 38, creatinine 2.50, sodium 139, chloride is 111. Albumin is 3.1.  5.  White count is 11.0, hemoglobin and hematocrit is 12.5 and 37.2.  6.  Troponin is 0.02.  7.  EKG showed normal sinus rhythm.   ASSESSMENT: A 59 year old, [name], with history of nephrolithiasis requiring multiple surgeries and nephrostomy tubes in the past history of chronic schizophrenia, comes in with:  1.  Syncopal/presyncopal episode with orthostatic hypotension, dizziness and possible sepsis related to urinary tract infection, recurrent in the setting of indwelling chronic Foley catheter. The patient is going to be admitted on the medical floor.  Will continue IV fluids for hydration. Blood pressure is much better. Will continue IV Rocephin. Follow blood culture, urine culture and change antibiotics accordingly.  May consider urology consultation if needed.  2.  Mild leukocytosis secondary  to urinary tract infection.  3.  Acute on chronic kidney disease stage 3 to 4. Will continue IV fluids for  hydration. Baseline is around 2.3 creatinine.  4.  History of chronic schizophrenia. Will continue his home psych meds once medication reconciliation has been completed.   5.  Deep vein thrombosis prophylaxis, subcutaneous heparin.  6.  Care management for discharge planning.  7.  Physical therapy for gait ambulation and safety.  8.  The patient is a FULL CODE.   TIME SPENT: 50 minutes. No family members in the Emergency Room.     ____________________________ Hart Rochester Posey Pronto, MD sap:cs D: 12/05/2013 15:49:00 ET T: 12/05/2013 16:17:05 ET JOB#: [id number]  cc: Kennie Snedden A. Posey Pronto, MD, <Dictator> Ilda Basset MD ELECTRONICALLY SIGNED 12/27/2013 15:34

## 2014-09-24 NOTE — H&P (Signed)
PATIENT NAME:  Alan Small, Alan Small MR#:  [id number] DATE OF BIRTH:  November 07, 1955  DATE OF ADMISSION:  10/07/2011  REFERRING PHYSICIAN:  Dr. Prince Rome PRIMARY CARE PHYSICIAN: [facility]  PRIMARY PSYCHIATRIST: None   CHIEF COMPLAINT: " I blacked out."   HISTORY OF PRESENT ILLNESS: The patient is a 59 year old male with past medical history of schizophrenia, nephrolithiasis, history of syncope 01/2010 due to profound anemia from acute gastrointestinal blood loss.  The patient was found to multiple duodenal ulcers. He has a history of diverticulitis and tobacco abuse.  He presents to the Emergency Department with the above-mentioned chief complaint. The patient states that early this morning he was getting ready to go to work when he suddenly experienced a syncopal episode and does not recall the details. Next thing he knew the driver that normally drives him to work knocked on his door and found the patient on the ground. Thereafter he was helped onto the couch. Upon regaining consciousness the patient states he felt generally weak. The office manager at the apartments where the patient resides called the ambulance and the patient was brought to the ER for further evaluation.  He appears quite dry on exam with dry mucous membranes and poor skin turgor.  He has evidence of acute renal failure with elevated BUN and creatinine. He also noted to have profound orthostatic hypotension as his systolic blood pressure falls to the 70s with standing.  He also has evidence of urinary tract infection. Other than feeling generally weak he is without specific complaints at this time.  He denied dizziness, lightheadedness, numbness, tingling, slurred speech, or facial droop. Denies any chest pain or heart palpitations. He denies skipping any meals or any obvious sources of fluid loss. He denies any black or bloody stools. He denies any abdominal pain. He denies any fevers or chills. He reports a syncopal episode earlier today  and now reports generalized weakness and is otherwise without specific complaints at this time. Noncontrast head CT is pending. Thereafter, hospitalist services were contacted for further evaluation and for hospital admission.   PAST SURGICAL HISTORY:  1. Appendectomy.  2. Esophagogastroduodenoscopy 01/2010.    PAST MEDICAL HISTORY: 1. Schizophrenia. 2. Nephrolithiasis. 3. Syncope due to profound anemia.  4. Acute blood loss anemia requiring blood transfusion secondary to GI bleed.  5. History of GI bleed with multiple duodenal ulcers noted on EGD 01/2010. 6. History of diverticulitis.  7. Tobacco abuse.  ALLERGIES: Morphine, Thorazine, and Mellaril.   HOME MEDICATIONS:  1. Risperdal 3 mg at bedtime.  2. Fluphenazine 37.5 mg injectable q. two weeks.  3. Fluoxetine 40 mg in the morning.  4. Diphenhydramine 50 mg at bedtime.  5. Benztropine 20 mg p.o. b.i.d.   FAMILY HISTORY: Father deceased, history of pneumonia. Mother deceased, history of brain cancer.   SOCIAL HISTORY: Tobacco ongoing, currently smokes 1.5 packs per day. States he has been smoking since age 57. Alcohol- none. Illicit drugs- none. The patient lives in an apartment in [place], [place] by himself.   REVIEW OF SYSTEMS:  CONSTITUTIONAL: Reports generalized weakness. Denies fevers, chills, or any fatigue. Denies any pain. Denies any recent changes in weight. HEAD/EYES: Denies headache or blurry vision. ENT: Denies tinnitus, earache, nasal discharge, or sore throat. RESPIRATORY: Denies shortness breath, cough, or wheezing. CARDIOVASCULAR: Denies  chest pain, heart palpitations, or lower extremity edema. GASTROINTESTINAL: Denies nausea, vomiting, diarrhea, constipation, melena, hematochezia, or abdominal pains. GENITOURINARY: Denies dysuria or hematuria. ENDOCRINE: Denies heat or cold intolerance.  HEME/LYMPH:  Denies easy bruising or bleeding. INTEGUMENT: Denies rashes or lesions. MUSCULOSKELETAL:  Denies joint pain  or any back pain. NEURO: Has generalized weakness. Denies headache, numbness, tingling, or dysarthria. Had a syncopal episode earlier today. PSYCHIATRIC: Has underlying schizophrenia. Denies suicidal or homicidal ideation.    PHYSICAL EXAMINATION:  VITAL SIGNS: Temperature 97.8, pulse 56, blood pressure 114/79, respirations 18, oxygen saturation 98% on room air. Orthostatic vital signs: Lying blood pressure 100/69, pulse 76.  Sitting blood pressure 88/67, pulse 76. Standing blood pressure 70/55, pulse 84.   GENERAL: The patient is alert and oriented, in no acute distress.   HEENT: Normocephalic, atraumatic. Pupils are equal, round and reactive to light and accommodation. Extraocular muscles are intact. Anicteric sclerae. Conjunctivae pink. Hearing intact to voice. Nares without drainage. Oral mucous is extremely dry but without any lesions noted.   NECK: Supple with full range of motion. No jugular venous distention, lymphadenopathy, or carotid bruits bilaterally. No thyromegaly or tenderness to palpation over the thyroid gland.   CHEST: Normal respiratory effort without use of accessory respiratory muscles. Lungs are clear to auscultation bilaterally without crackles, rales, or wheezes.   CARDIOVASCULAR: S1, S2 positive. Regular rate and rhythm. No murmurs, rubs, or gallops. PMI is not lateralized.   ABDOMEN: Soft, nontender, nondistended. Normoactive bowel sounds. No hepatosplenomegaly or palpable masses. No hernias.   EXTREMITIES: No clubbing, cyanosis, or edema. Pedal pulses are palpable bilaterally.   SKIN: No suspicious rashes. Skin turgor is poor.  LYMPH:  No cervical lymphadenopathy.   NEURO:  Alert and oriented times three.  Cranial nerves II-XII grossly intact.  No focal deficits.  PSYCH: Appropriate affect.   LABORATORY, DIAGNOSTIC, AND RADIOLOGICAL DATA: Noncontrast head CT is pending at this time. CBC: WBC 12.3, hemoglobin 12.3, hematocrit 37.3, platelets 291, MCV 84.  Comprehensive metabolic panel within normal limits except for BUN 44 and creatinine 2.14, chloride is 111, serum albumin 2.9. Estimated GFR is 33. Troponin less than 0.02.   Urinalysis:  Cloudy urine with 3+ leukocyte esterase, negative nitrite, negative protein, negative ketones, 4 RBCs, 159 WBCs, trace bacteria. WBC clumping is present.   EKG:  Normal sinus rhythm, 78 beats per minute with normal axis, normal intervals with no acute ST or T wave changes.   ASSESSMENT AND PLAN:  59 year old male with history of schizophrenia, nephrolithiasis, history of syncope from acute gastrointestinal blood loss with history of multiple duodenal ulcers 01/2010, and history of tobacco abuse here with:  1. Syncope: Suspect due to orthostatic hypotension from dehydration. Recommend hospital admission for further evaluation and management. We will admit the patient to the telemetry unit and perform continuous telemetry monitoring. Await noncontrast head CT. Also obtain 2-D echocardiogram, cycle cardiac enzymes, and obtain carotid artery Doppler ultrasound. Otherwise, we will rehydrate the patient with IV fluids and monitor orthostatic vital signs closely. Further work-up and management to follow depending on the patient's clinical course.  2. Orthostatic hypotension: No obvious sources of fluid loss. Denies vomiting or diarrhea and reports normal p.o. intake and denies skipping any meals. We will hydrate the patient with IV fluids for blood pressure support and follow orthostatic vital signs closely.  3. Dehydration: As above, we will rehydrate the patient with IV fluids and reassess. 4. Acute renal failure: Suspect prerenal etiology from volume depletion/dehydration. The patient appears to be nonoliguric. We will avoid nephrotoxins. Monitor ins and outs strictly. Reassess renal function in a.m. posthydration.  5. Urinary tract infection: Based off abnormal urinalysis. We will obtain urine cultures and start  empiric IV  antibiotics in the form of ceftriaxone. 6. Leukocytosis: Suspect secondary to urinary tract infection. Blood cultures have been drawn in the ER and also urine cultures have been sent. We will follow up culture data. In the meanwhile, we will keep the patient on IV antibiotics as above and repeat WBC count in the a.m.  7. Mild anemia: Normocytic. Given the patient's history of syncope from GI blood loss and profound anemia, we will check iron panel, ferritin level, and stool for occult blood, and follow hemoglobin and hematocrit closely. Currently there are no indications for blood transfusion as he is only mildly anemic and his only symptom at this time is generalized weakness which could also be explained by his dehydration and volume depletion and orthostatic hypotension. After we rehydrate the patient with IV fluids we will reassess to see if his symptoms have improved. We will follow hemoglobin and hematocrit closely.  8. Tobacco abuse: The patient was strongly counseled on the importance of smoking cessation. Approximately three minutes were spent in doing so. He is requesting a Nicotrol inhaler at this time and we will provide this as per his request.  9. Deep vein thrombosis prophylaxis: SCDs and TEDs. Encourage early ambulation but given syncope we will ambulate with assist and  primary team to consider PT evaluation.  10. CODE STATUS: FULL CODE.  TIME SPENT ON ADMISSION:  Approximately 60 minutes.    ____________________________ Romie Jumper, MD knl:bjt D: 10/07/2011 14:38:58 ET T: 10/07/2011 16:05:31 ET JOB#: [id number]  cc: Romie Jumper, MD, <Dictator> [place] Alfonzo Feller Shatha Hooser MD ELECTRONICALLY SIGNED 10/23/2011 15:04

## 2014-09-24 NOTE — Discharge Summary (Signed)
PATIENT NAME:  Alan Small, Alan Small MR#:  [id number] DATE OF BIRTH:  Jun 15, 1955  DATE OF ADMISSION:  10/07/2011 DATE OF DISCHARGE:  10/09/2011  DIAGNOSES:  1. Syncope possibly due to orthostatic hypotension. 2. Orthostatic hypotension. 3. Acute renal failure. 4. Leukocytosis.  5. Anemia.  6. Schizophrenia.  DISPOSITION: The patient is being discharged home.   DIET: Regular.   ACTIVITY: As tolerated.   FOLLOWUP: Follow up with primary care physician at [facility] 1 to 2 weeks after discharge.   DISCHARGE MEDICATIONS:  1. Benadryl 50 mg at bedtime.  2. Risperdal 3 mg at bedtime.  3. Fluoxetine 40 mg daily.  4. Benztropine 2 mg b.i.d.  5. Fluphenazine 37.5 mg every two weeks.   RESULTS: The patient was initially orthostatic with a drop in blood pressure on sitting and standing. Carotid ultrasound: No hemodynamically significant stenosis. CT head showed no acute abnormalities. 2-D echo showed ejection fraction of 50%, mild left ventricular hypertrophy, mild MR and TR. Urine culture: Coagulase-negative staph. Blood cultures negative. White count 12.3, hemoglobin 12.3 to 11.2. Normal platelet count. Creatinine ranging from 2.14 to 1.81. Normal electrolytes. Normal cardiac enzymes. Normal TSH.   HOSPITAL COURSE: The patient is a 59 year old male with past medical history of schizophrenia, kidney stones, history of syncope due to acute blood loss anemia with multiple duodenal ulcers who presented with syncopal episode. On presentation he was found to be very dehydrated and orthostatic. The patient was admitted to the hospital and had extensive work-up including carotid ultrasound, echo, CT of the head, cardiac enzymes, and TSH, all of which were normal. Orthostatics were checked and the patient was initially very orthostatic. He was hydrated with IV fluids which resulted in resolution of his orthostatic hypotension. The patient also had acute renal failure, possibly due to dehydration, and  his renal function has improved with fluids. The patient was found to have coagulase-negative staph in his urine. His blood cultures were negative. The patient denied any dysuria. The case was discussed with Dr. Clayborn Bigness who said that coagulase-negative staph does not cause urinary tract infections and that patient did not require any antibiotics. The patient had some anemia of chronic disease compounded by hemodilution. His iron studies showed low iron and were otherwise normal. The patient was advised about staying hydrated, drinking plenty of fluids, and avoiding sudden changes in position. By the time of discharge the patient was completely asymptomatic and ambulating without any difficulty.   TIME SPENT: 45 minutes.   ____________________________ Cherre Huger, MD sp:bjt D: 10/09/2011 15:43:23 ET T: 10/10/2011 10:25:38 ET JOB#: [id number]  cc: Cherre Huger, MD, <Dictator> [place] MD ELECTRONICALLY SIGNED 10/10/2011 15:12

## 2014-10-19 IMAGING — CT CT ABD-PELV W/O CM
1 of 2 series · 15 of 32 positions shown, 19 images · non-contrast
Comparison: none

REASON FOR EXAM: (1) right flank mass; (2) right pelvic mass
COMMENTS:   LMP: (Male)

PROCEDURE:     CT  - CT ABDOMEN AND PELVIS W[DATE] [DATE]
RESULT:     History: Right flank mass.
Comparison Study: Prior CT of 03/03/2010.

[Series 2: 3mm soft tissue · axial · 0.74mm/px · z∈[-510,-75]mm · 15 of 159 slices shown, 19 images]
[im 7/159  soft-tissue]
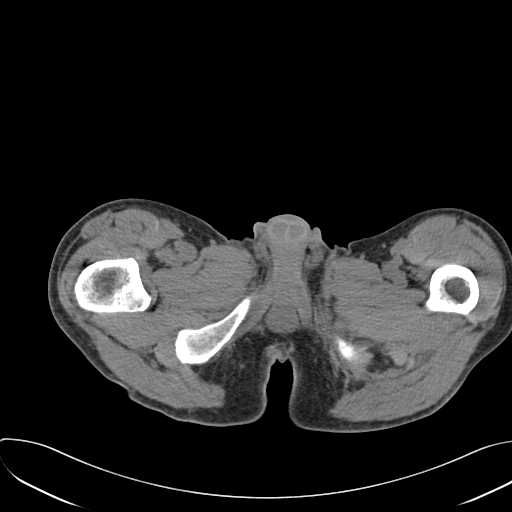
[im 7/159  bone]
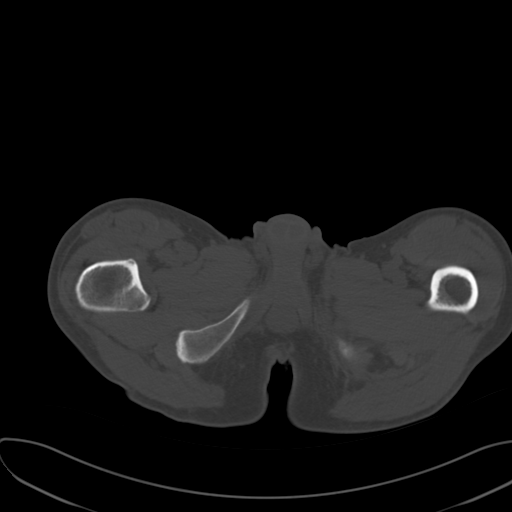
[im 20/159  soft-tissue]
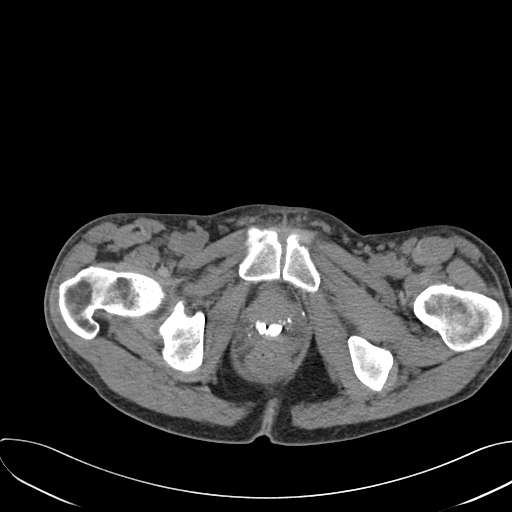
[im 33/159  soft-tissue]
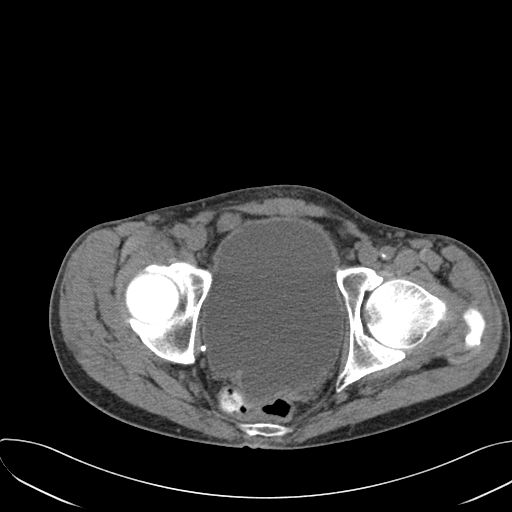
[im 47/159  soft-tissue]
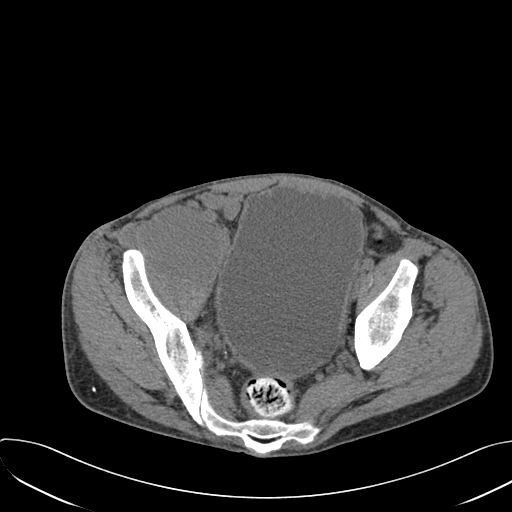
[im 53/159  soft-tissue]
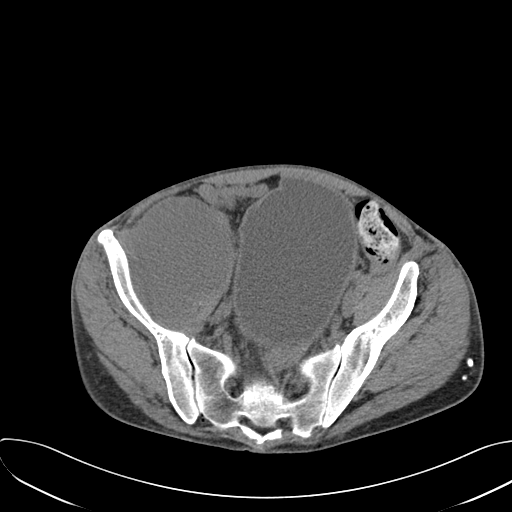
[im 66/159  soft-tissue]
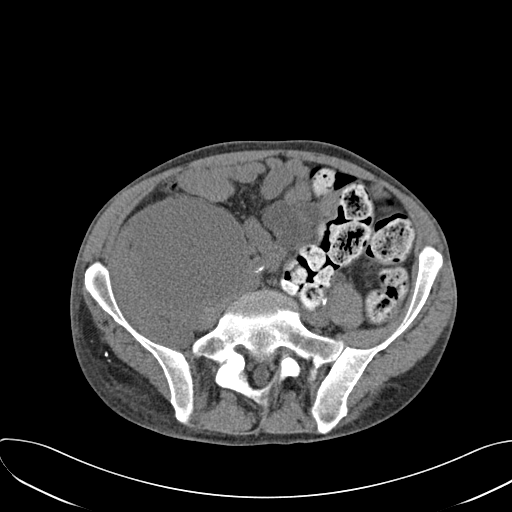
[im 80/159  soft-tissue]
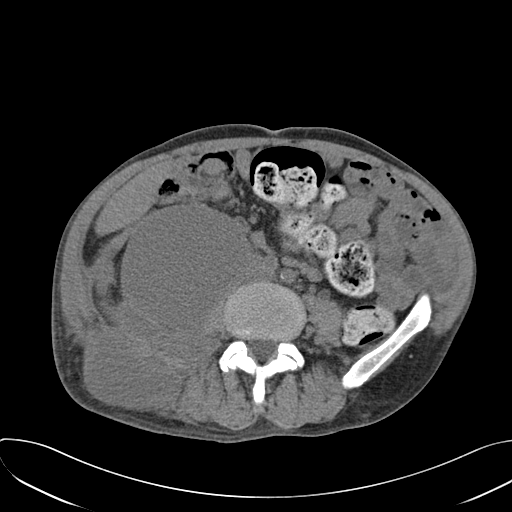
[im 93/159  soft-tissue]
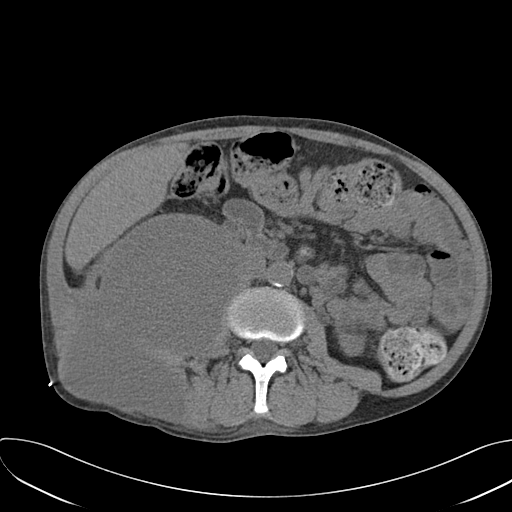
[im 106/159  soft-tissue]
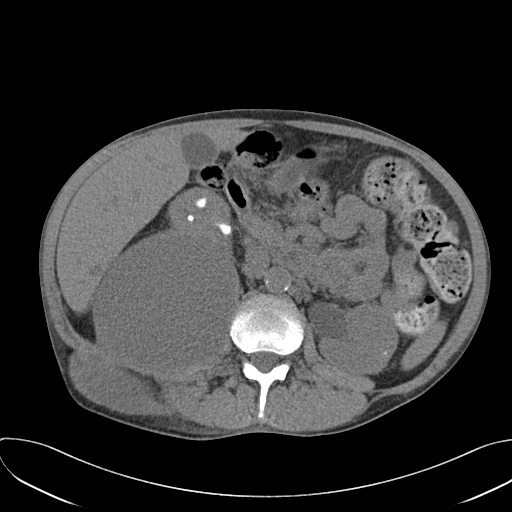
[im 106/159  bone]
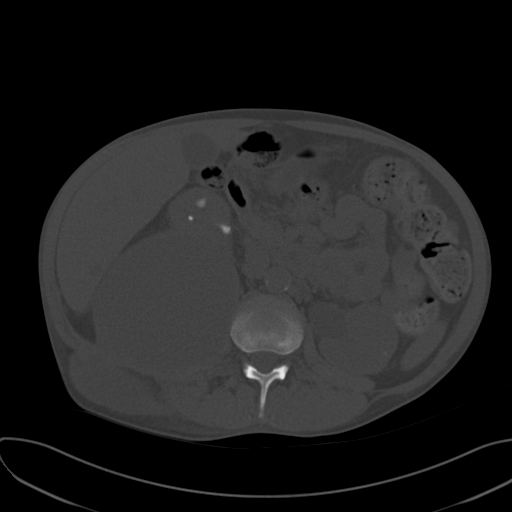
[im 112/159  soft-tissue]
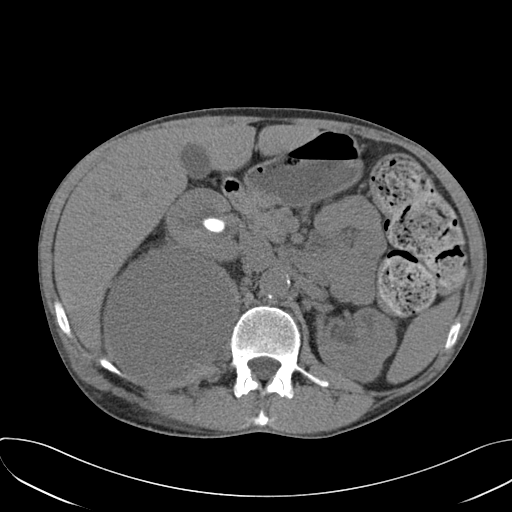
[im 126/159  soft-tissue]
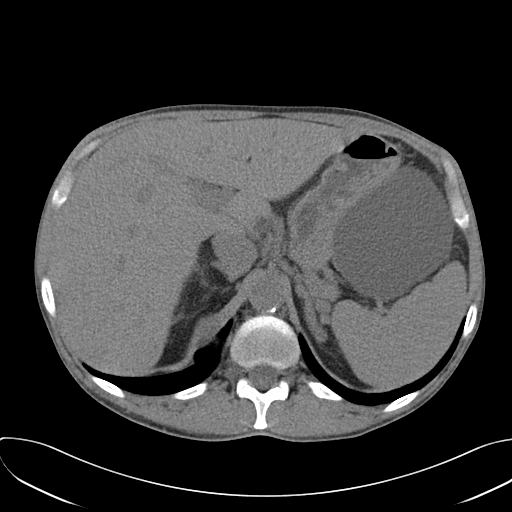
[im 132/159  lung]
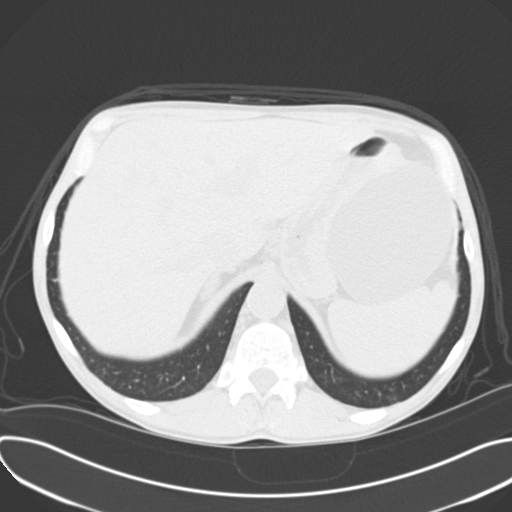
[im 139/159  soft-tissue]
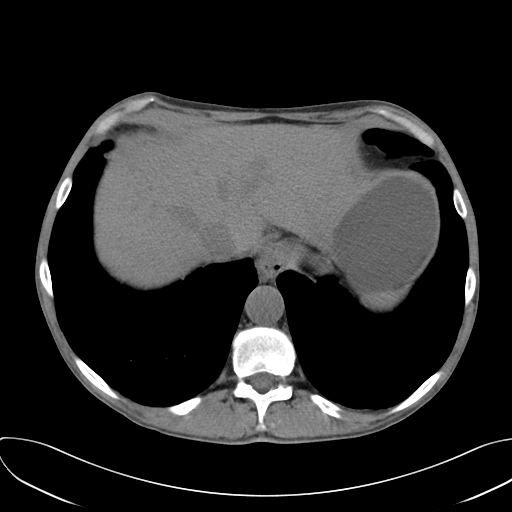
[im 139/159  lung]
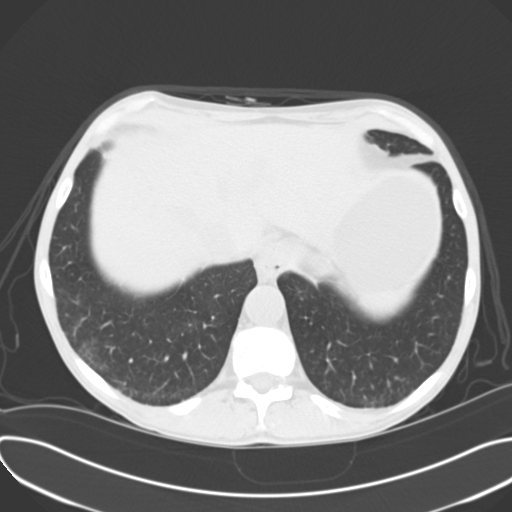
[im 145/159  lung]
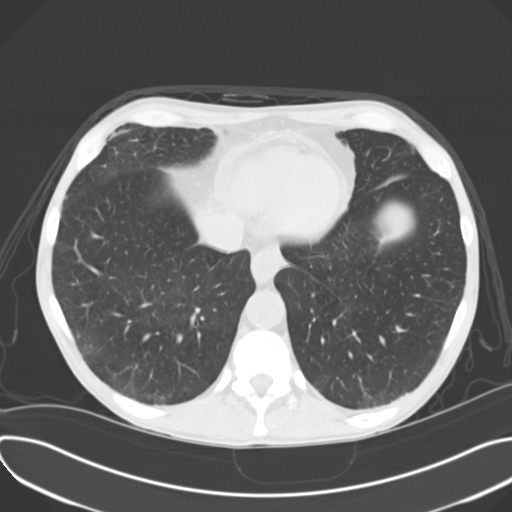
[im 152/159  soft-tissue]
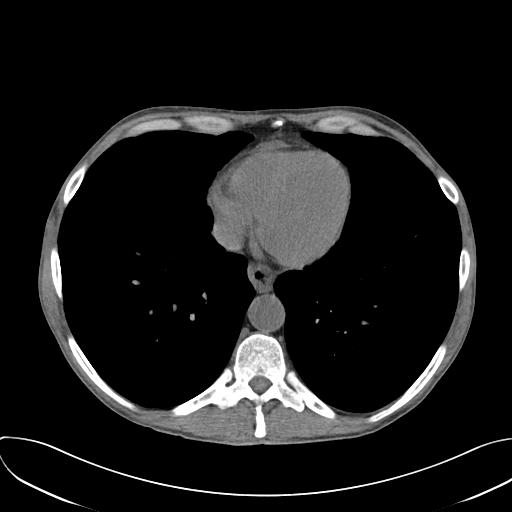
[im 152/159  lung]
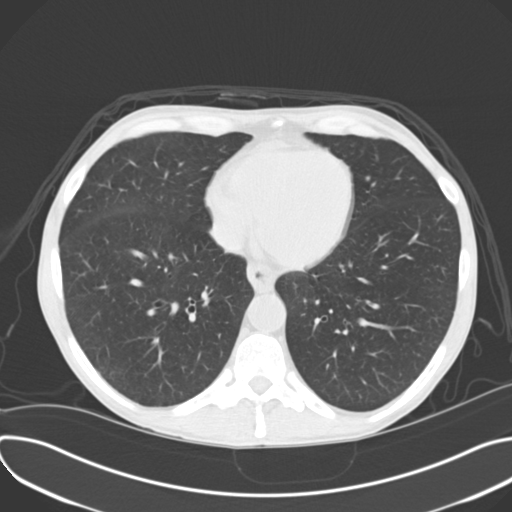

[15 of 32 positions shown; findings below may reference images not displayed]

FINDINGS: Standard nonenhanced CT obtained. Simple cyst noted in liver.
Spleen is normal. Pancreas normal. There is a 15 x 11 x 20 cm cystic mass
occupying the right iliopsoas muscle and projecting through the right
posterior abdominal wall into the subcutaneous tissue. No air is noted
within the mass. This mass could represent a site of prior hemorrhage .An
abscess or cystic malignancy could also present in this fashion. The right
kidney is severely displaced anteriorly. The possibility of a large urinoma
presenting in this fashion cannot be entirely excluded. There is severe
right nephrolithiasis with Staghorn calculus on the right. Minimal
hydronephrosis present. Left nephrolithiasis with Staghorn calculus also
present. Aorta is normal in caliber. No bowel distention or free air.
Bladder is distended. Phleboliths. Atelectasis lung bases.
IMPRESSION: 1. 15 x 11 x 20 cm cystic mass involving the right iliopsoas muscle and
posterior abdominal wall as described above.
2. Bilateral severe nephrolithiasis with Staghorn calculi. The right kidney
is a severely displaced by the above described mass. Minimal hydronephrosis
on right.
3. Bladder distention.

## 2014-10-19 IMAGING — CT CT GUIDED ABCESS DRAINAGE WITH CATHETER
2 series · 12 of 16 positions shown, 15 images · non-contrast
Comparison: none

REASON FOR EXAM: right sided psoas fluid collection.
COMMENTS:

PROCEDURE:     CT  - CT GUIDED ABSCESS DRAINAGE  - March 26, 2012  [DATE]
RESULT:     CT-Guided Percutaneous Abscess Drainage
INDICATION: Right-sided psoas abscess. Percutaneous drainage is requested.

[Series 4: soft tissue · axial · 0.72mm/px · z∈[-548,-332]mm · 10 of 88 slices shown, 13 images]
[im 8/88  soft-tissue]
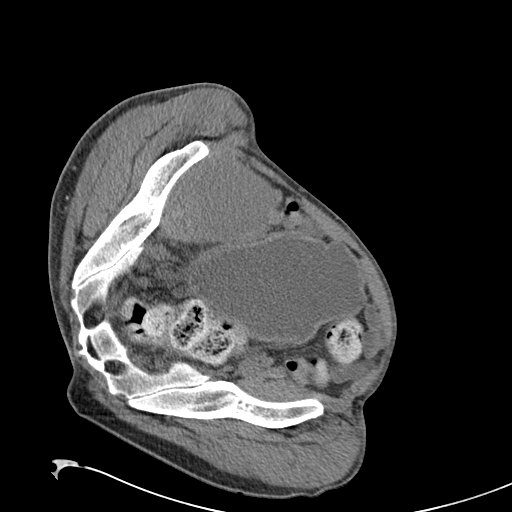
[im 8/88  bone]
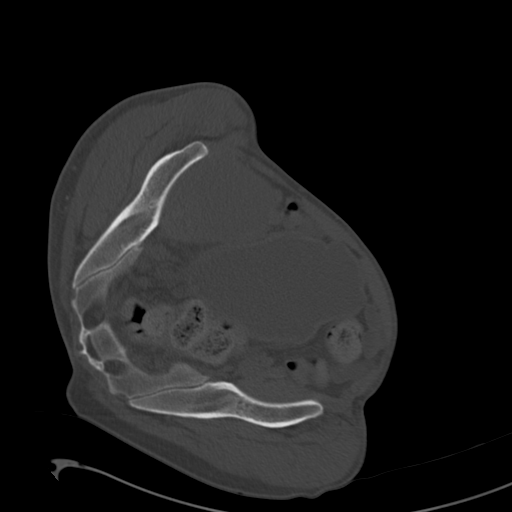
[im 16/88  soft-tissue]
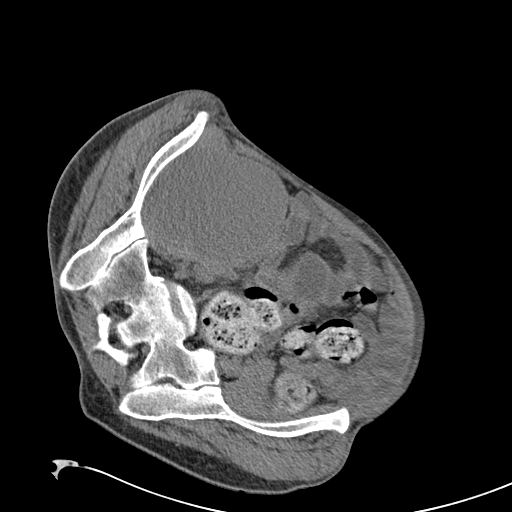
[im 24/88  soft-tissue]
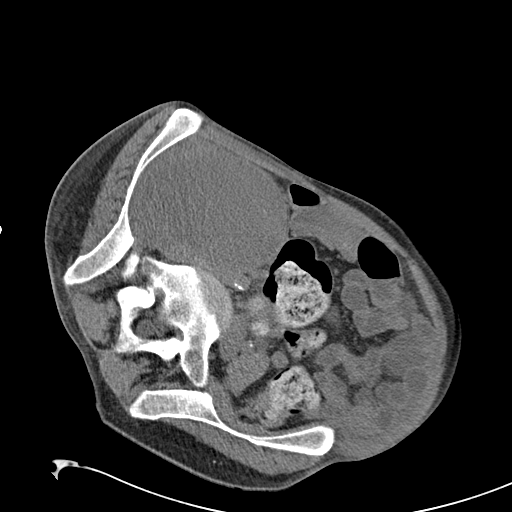
[im 32/88  soft-tissue]
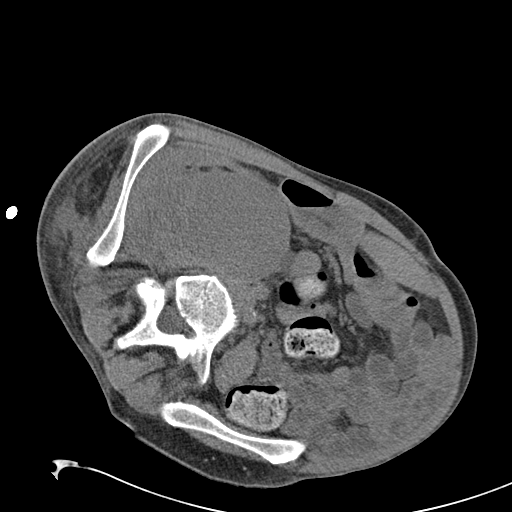
[im 40/88  soft-tissue]
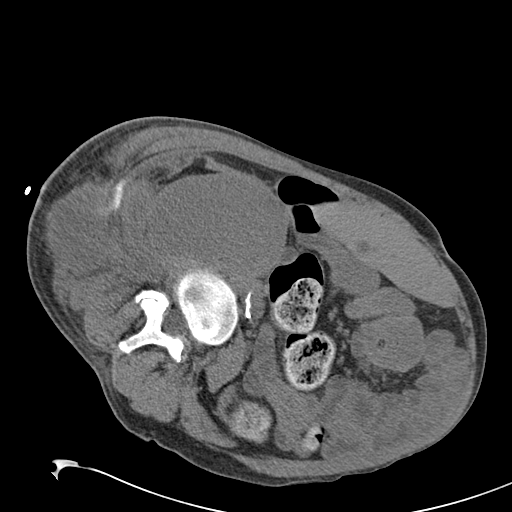
[im 40/88  bone]
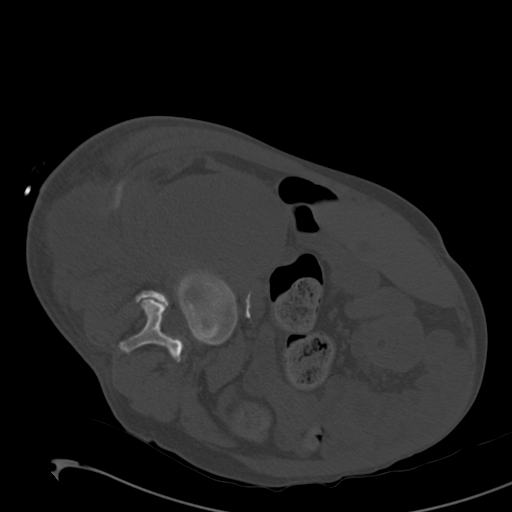
[im 48/88  soft-tissue]
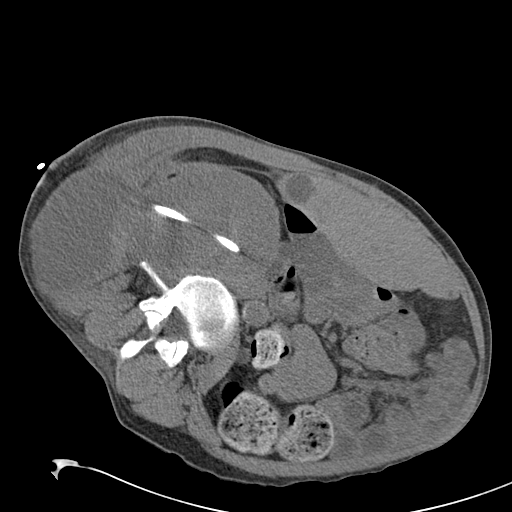
[im 56/88  soft-tissue]
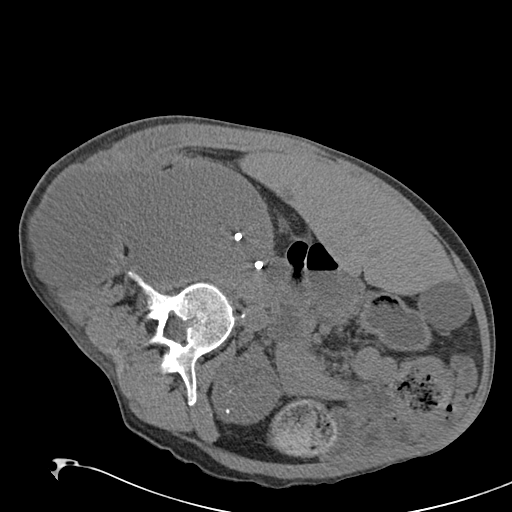
[im 64/88  soft-tissue]
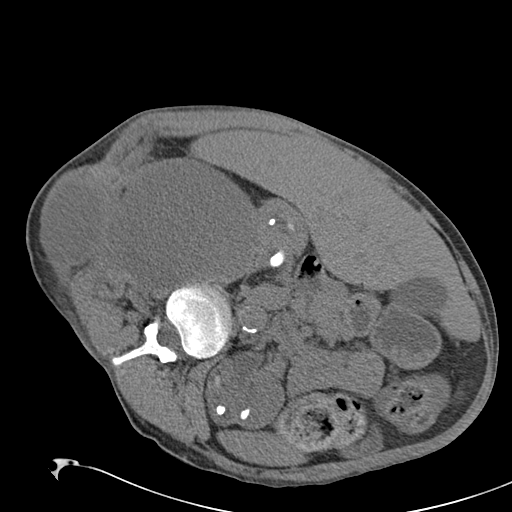
[im 72/88  soft-tissue]
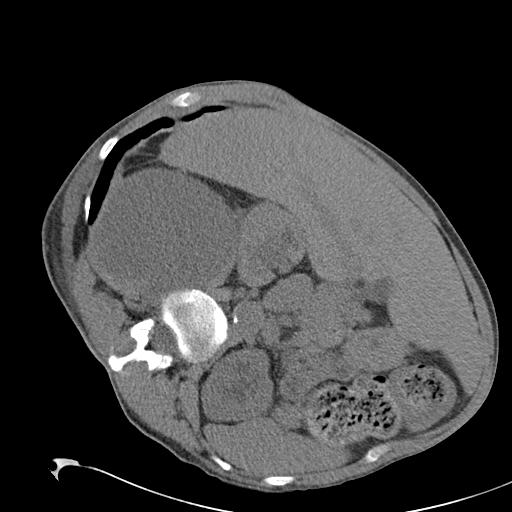
[im 72/88  bone]
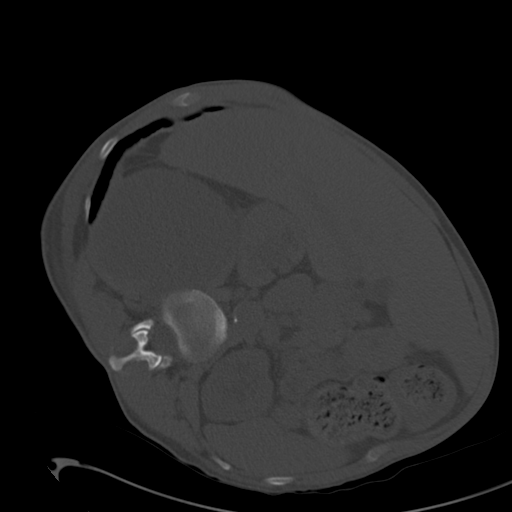
[im 80/88  soft-tissue]
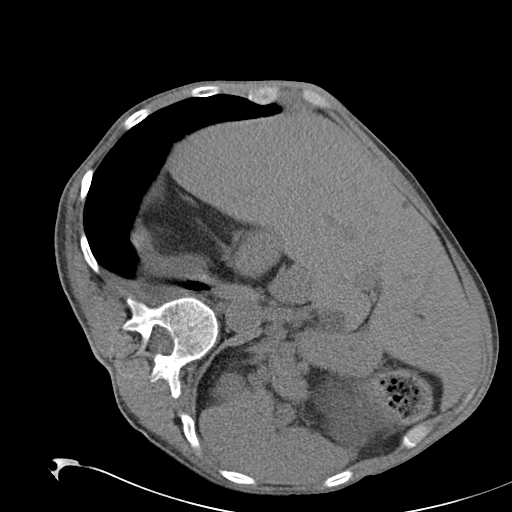

[Series 6: lung · axial · 0.72mm/px · z∈[-362,-334]mm · 2 of 28 slices shown]
[im 10/28  bone]
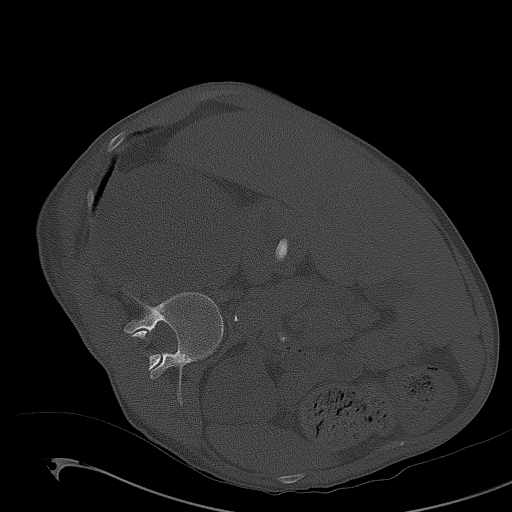
[im 19/28  bone]
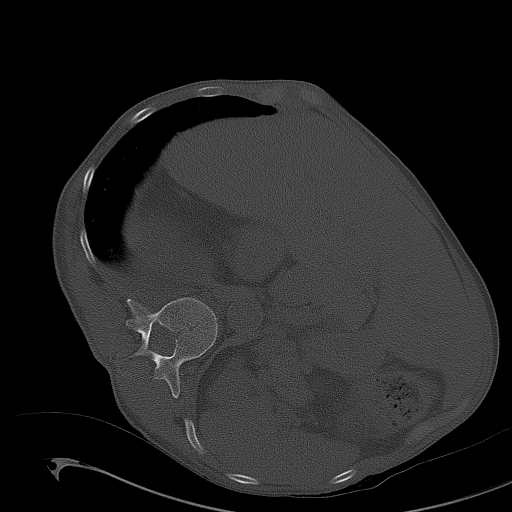

[12 of 16 positions shown; findings below may reference images not displayed]

Comparisons: None

Procedure:

Clinical assessment was performed and informed consent obtained.  The
patient was brought to the CT suite and placed supine on the table. A
focused abdominal CT without contrast was performed.

There is a large retroperitoneal psoas abscess which extends into the right
flank. The collection was deemed amenable to drainage.

The right was prepped and draped in the usual sterile fashion. The overlying
skin was anesthetized with 1% lidocaine. A 10-gauge trocar catheter was
inserted into the fluid collection through the right flank under CT
fluoroscopy. Syringe aspiration of purulent material which was sent for gram
stain and culture. The catheter was secured to the skin using a StatLock
device, dressed, and connected to a drainage bag.

The procedure was well tolerated and without complication. Hemostasis was
achieved. The patient was transferred to the recovery unit in stable
condition.
IMPRESSION: Successful CT-guided placement of 10 French French APDL pigtail catheter
into a right retroperitoneal fluid collection.

[REDACTED]

## 2014-10-26 DIAGNOSIS — K5909 Other constipation: Secondary | ICD-10-CM | POA: Insufficient documentation

## 2014-10-26 DIAGNOSIS — K625 Hemorrhage of anus and rectum: Secondary | ICD-10-CM | POA: Insufficient documentation

## 2014-10-28 DIAGNOSIS — K219 Gastro-esophageal reflux disease without esophagitis: Secondary | ICD-10-CM | POA: Insufficient documentation

## 2014-11-28 IMAGING — CR DG CHEST 1V PORT
1 series · 1 of 1 positions shown · non-contrast
Comparison: none

REASON FOR EXAM: respiratory distress, increased RR, pain - Left flank
COMMENTS:

[ap]
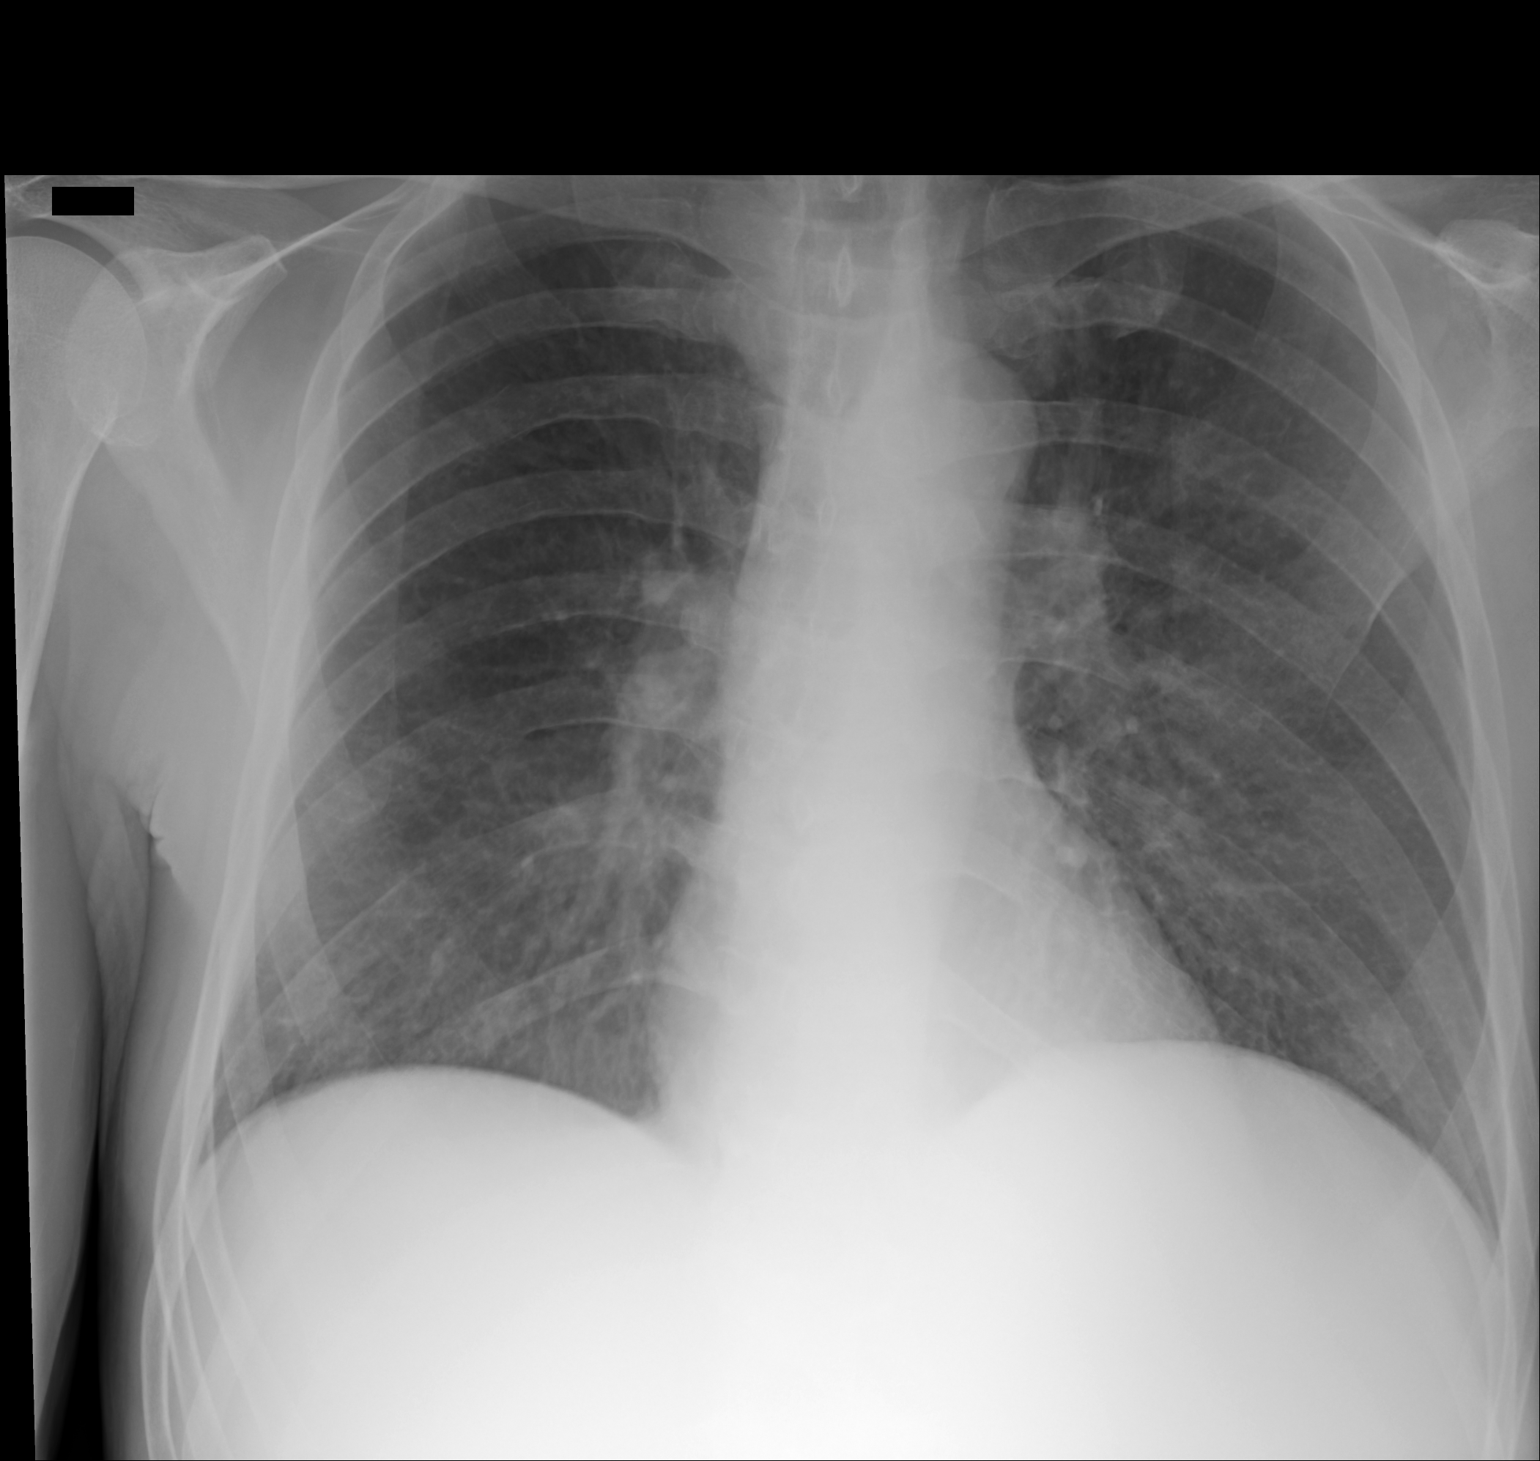

[1 of 1 positions shown; findings below may reference images not displayed]

PROCEDURE:     DXR - DXR PORTABLE CHEST SINGLE VIEW  - May 05, 2012  [DATE]

RESULT:     Comparison is made to the study March 12, 2010.

The lungs are well-expanded. There is mild prominence of the perihilar lung
markings especially on the left. The cardiac silhouette is normal in size.
The pulmonary vascularity is not engorged. There is no pleural effusion or
pneumothorax or pneumomediastinum.
IMPRESSION: The findings suggest minimal subsegmental atelectasis in
the left perihilar region. A followup PA and lateral chest x-ray would be of
value.

[REDACTED]

## 2014-11-28 IMAGING — CT CT ABD-PELV W/O CM
1 of 3 series · 13 of 32 positions shown, 18 images · non-contrast
Comparison: none

REASON FOR EXAM: (1) Confusion - left flank pain - hx of retroperitoneal
abscess on R - Sujatha Rocha
COMMENTS:

PROCEDURE:     CT  - CT ABDOMEN AND PELVIS W[DATE]  [DATE]
RESULT:     Comparison: 03/30/2012, 03/03/2010
TECHNIQUE: Multiple axial images from the lung bases to the symphysis pubis
were obtained without oral and without intravenous contrast.

[Series 2: soft tissue · axial · 0.78mm/px · z∈[-660,-234]mm · 13 of 162 slices shown, 18 images]
[im 10/162  soft-tissue]
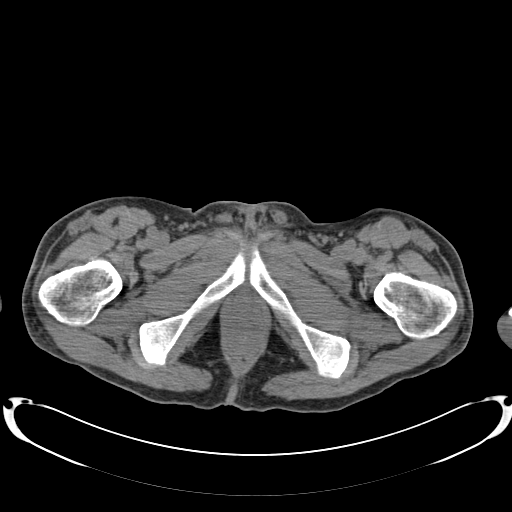
[im 10/162  bone]
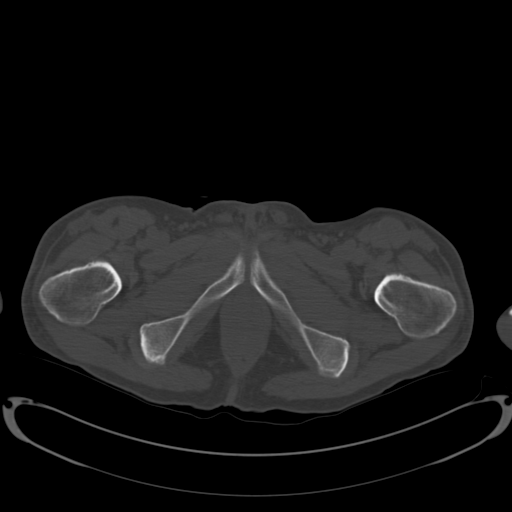
[im 29/162  soft-tissue]
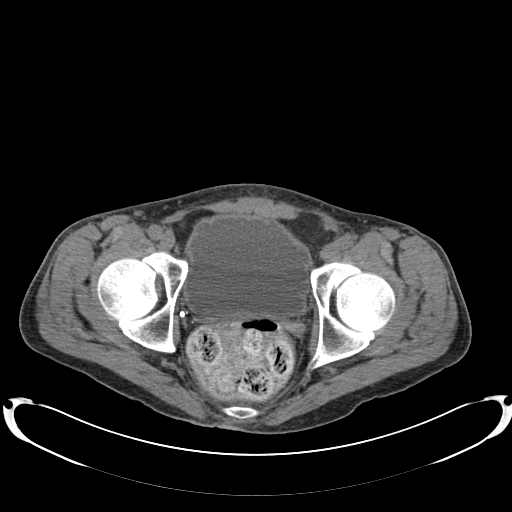
[im 38/162  soft-tissue]
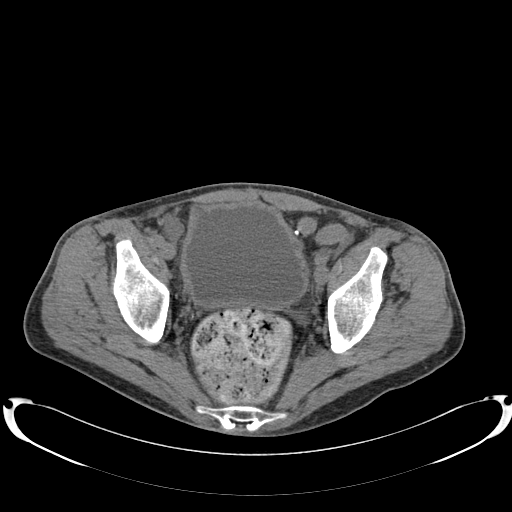
[im 48/162  soft-tissue]
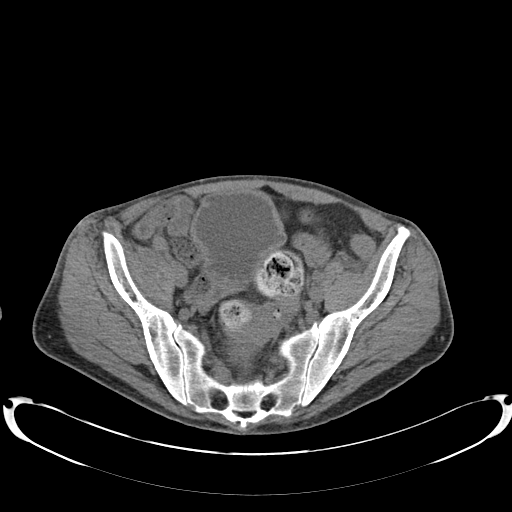
[im 67/162  soft-tissue]
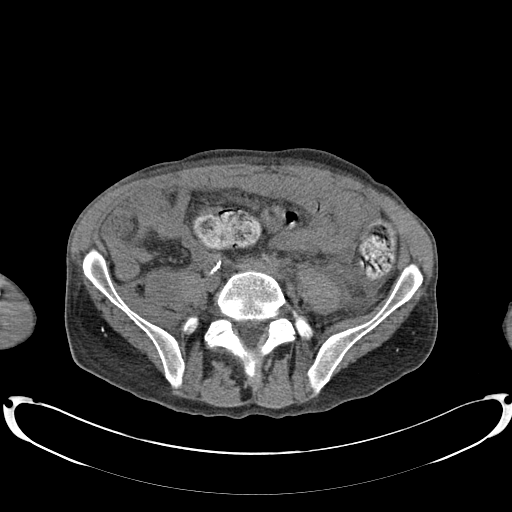
[im 76/162  soft-tissue]
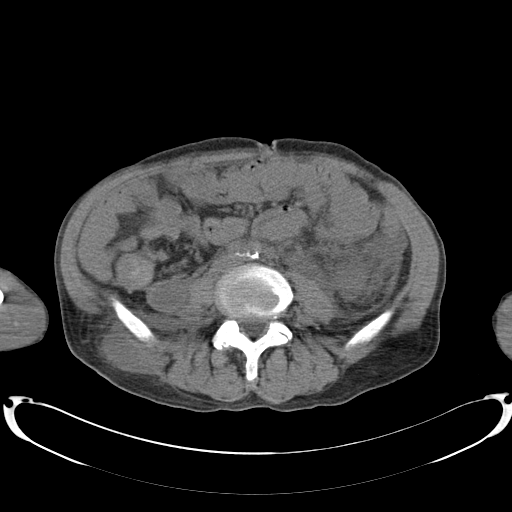
[im 86/162  soft-tissue]
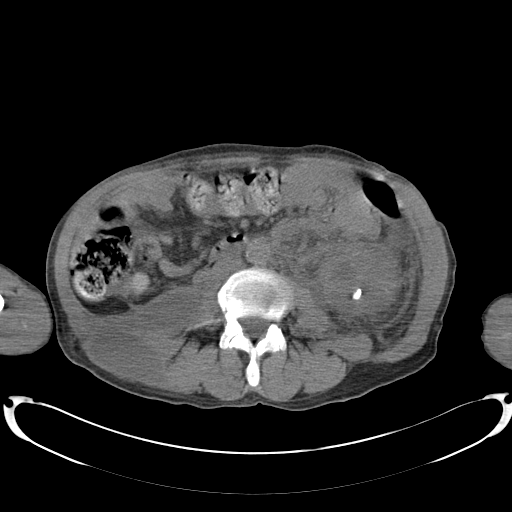
[im 105/162  soft-tissue]
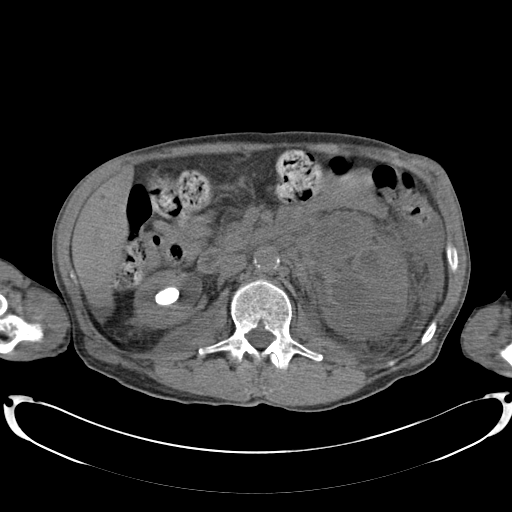
[im 114/162  soft-tissue]
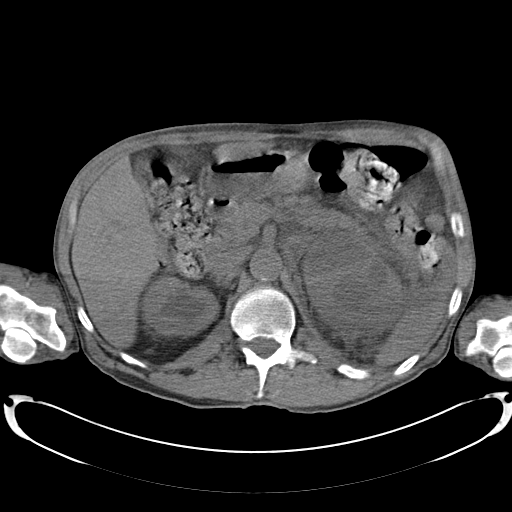
[im 114/162  bone]
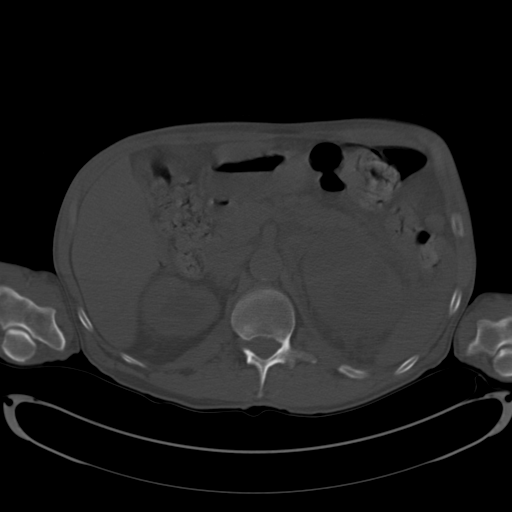
[im 124/162  soft-tissue]
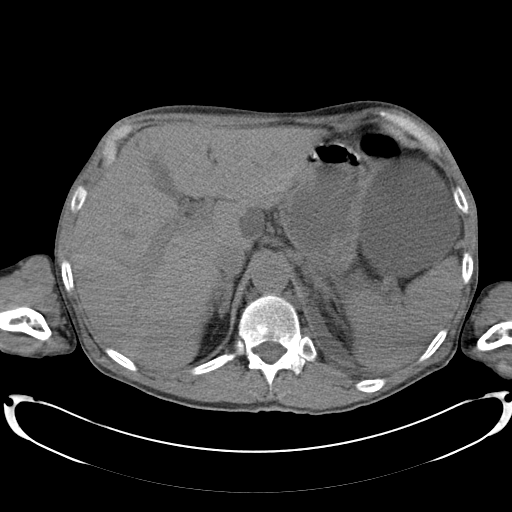
[im 124/162  lung]
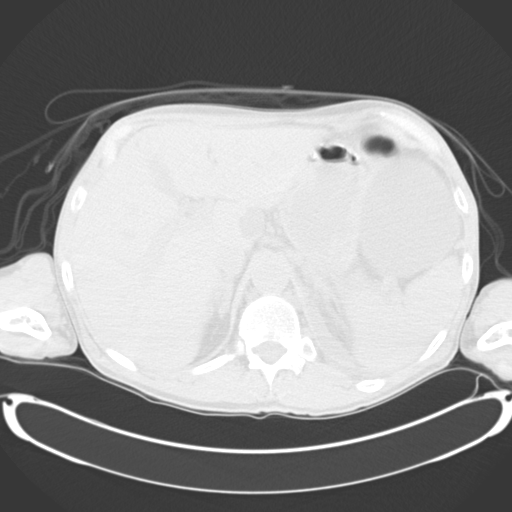
[im 133/162  lung]
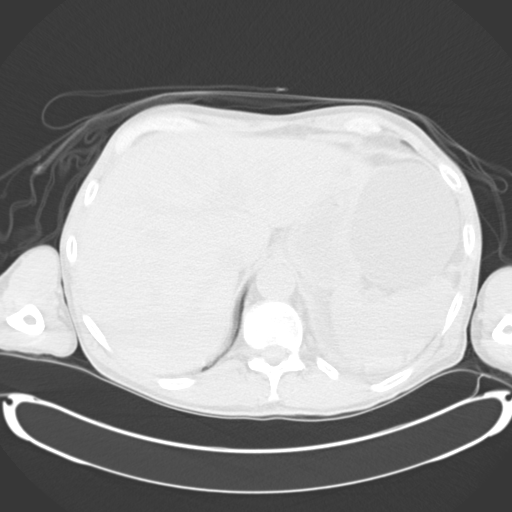
[im 143/162  soft-tissue]
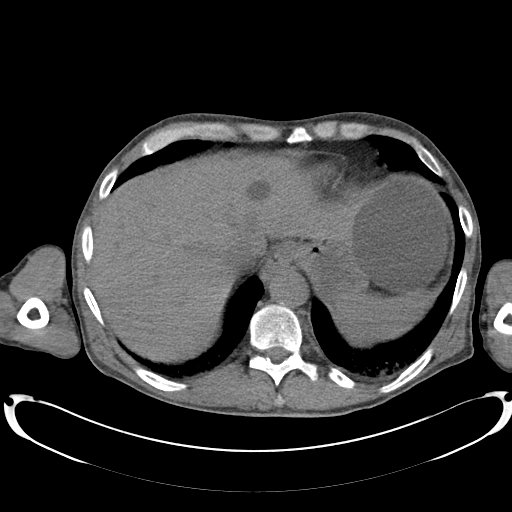
[im 143/162  lung]
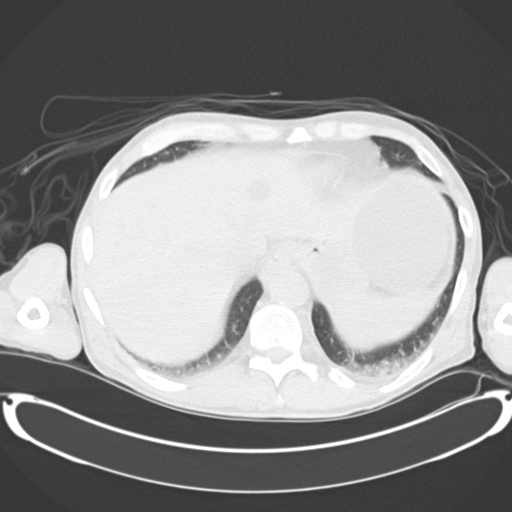
[im 152/162  soft-tissue]
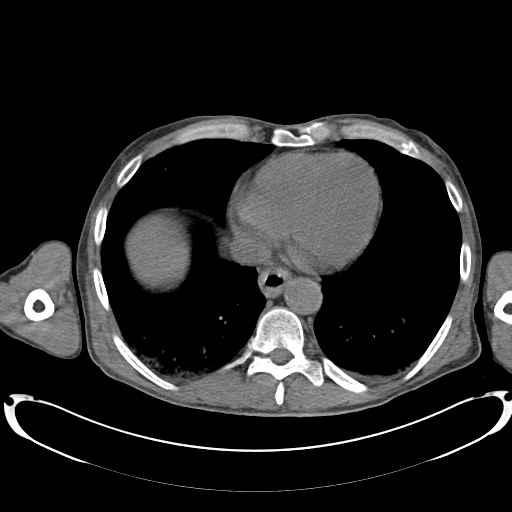
[im 152/162  lung]
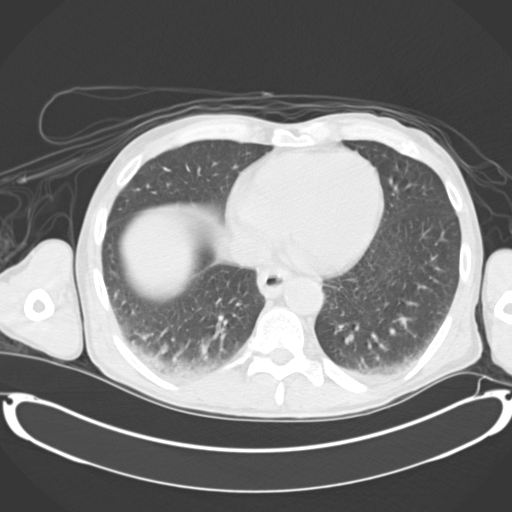

[13 of 32 positions shown; findings below may reference images not displayed]

FINDINGS: Mild basilar opacities are likely secondary to atelectasis.

Lack of intravenous contrast limits evaluation of the solid abdominal
organs.  There are multiple small low-attenuation lesions in the liver. The
larger lesions likely represent cysts. The others are too small to
characterize. The gallbladder, spleen, adrenals, and pancreas are
unremarkable. There is a large cystic structure between the spleen and the
stomach which is similar to prior. This is nonspecific. It measures 9.9 x
8.5 cm in greatest axial dimension.

The left kidney is markedly enlarged and heterogeneous. There is moderate
left perinephric stranding. There is a 2.3 cm calculus in the left
ureteropelvic junction. There are multiple other large calculi in the left
kidney. Multiple large calculi are seen in the right kidney. There is mild
pelviectasis of the right kidney as well caliectasis of the superior pole of
the right kidney. No definite ureterectasis.

There is a fluid collection in the right posterior abdominal wall which
extends intra-abdominally, adjacent to the left psoas muscle. This is
similar to prior. It measures 8.7 x 6.4 cm in greatest axial dimension. The
percutaneous drainage catheter is no longer present.

There is mild prominence of the bladder wall. This may related to neurogenic
bladder. Cystitis is not excluded. The small and large bowel are normal in
caliber. The appendix is not visualized. However, there are no adjacent
inflammatory changes.

No aggressive lytic or sclerotic osseous lesions are identified.
IMPRESSION: 1. The left kidney is enlarged and markedly heterogeneous. This is likely
related to hydronephrosis superimposed on a chronically scarred kidney.
There is a large calculus in the right ureteropelvic junction. Moderate left
perinephric stranding is nonspecific.
2. The fluid collection in the right posterior abdominal wall which extends
intra-abdominally adjacent to the left psoas muscle is similar to prior.
3. Other findings as above.

## 2014-11-28 IMAGING — CT CT HEAD WITHOUT CONTRAST
2 series · 16 of 30 positions shown, 20 images · non-contrast
Comparison: none

REASON FOR EXAM: confusion
COMMENTS:

PROCEDURE:     CT  - CT HEAD WITHOUT CONTRAST  - May 05, 2012  [DATE]
RESULT:     Comparison:  10/07/2011
TECHNIQUE: Multiple axial images from the foramen magnum to the vertex were
obtained without IV contrast.

[Series 2: without · axial · non-contrast · 0.43mm/px · z∈[-170,-40]mm · 13 of 32 slices shown, 17 images]
[im 3/32  brain]
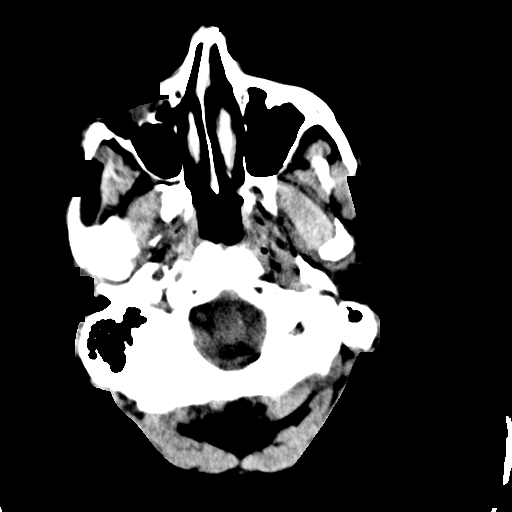
[im 3/32  bone]
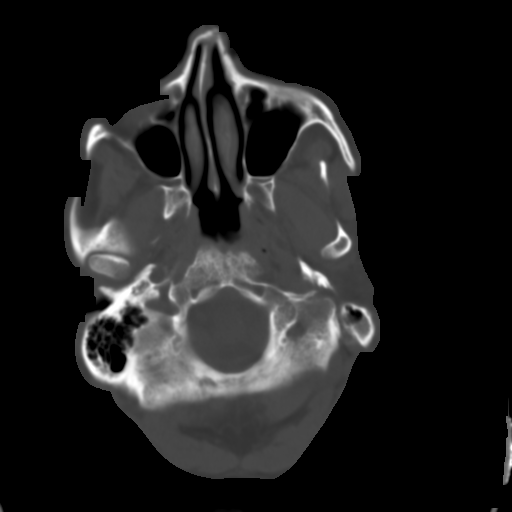
[im 5/32  brain]
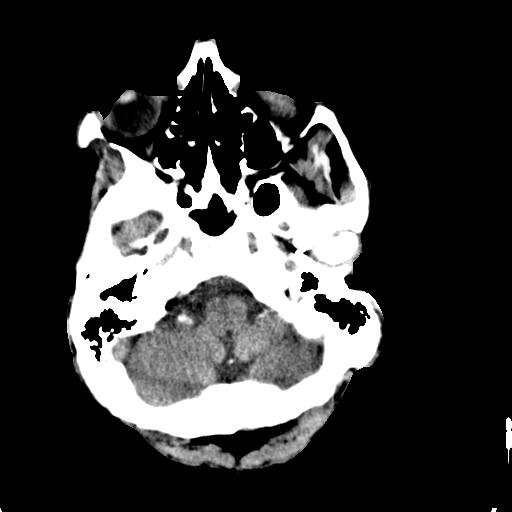
[im 7/32  brain]
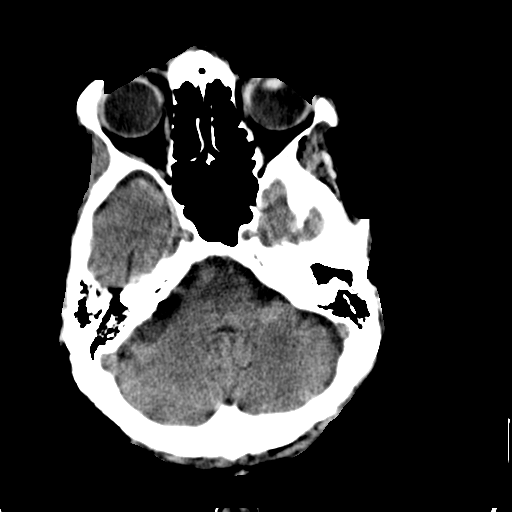
[im 9/32  brain]
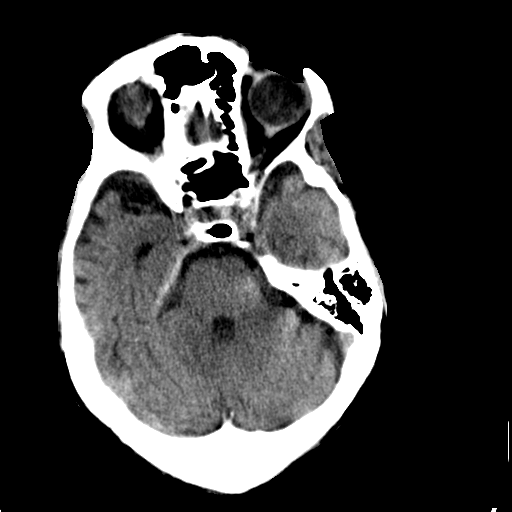
[im 12/32  brain]
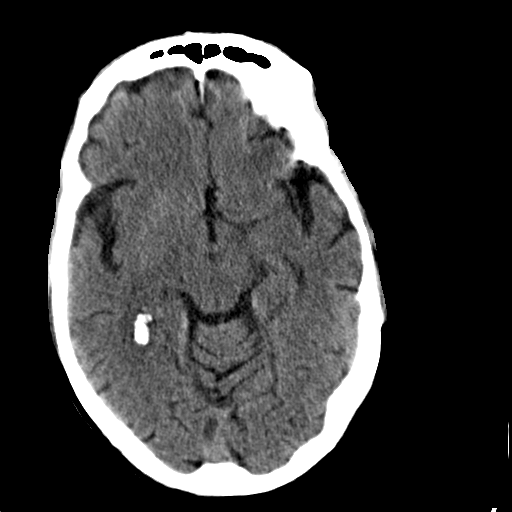
[im 12/32  bone]
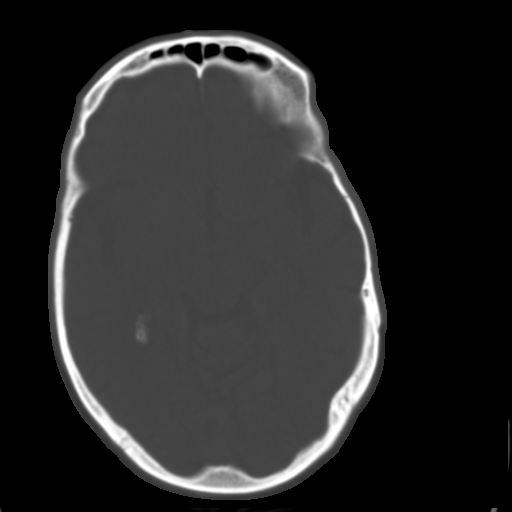
[im 14/32  brain]
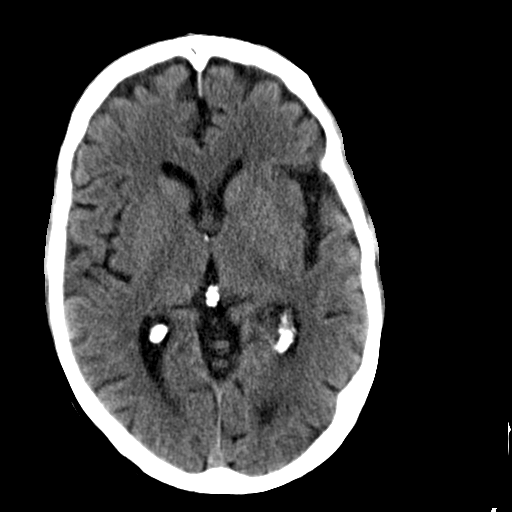
[im 16/32  brain]
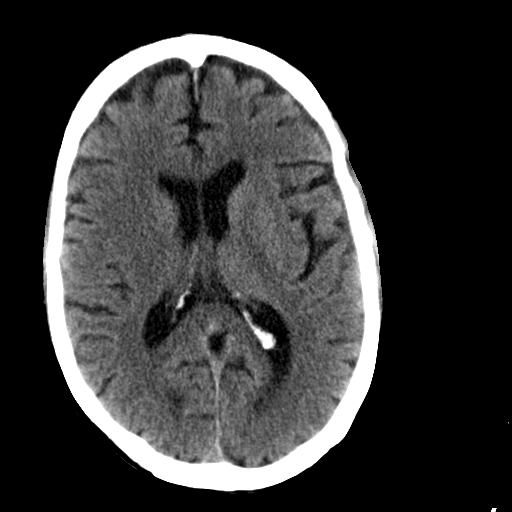
[im 18/32  brain]
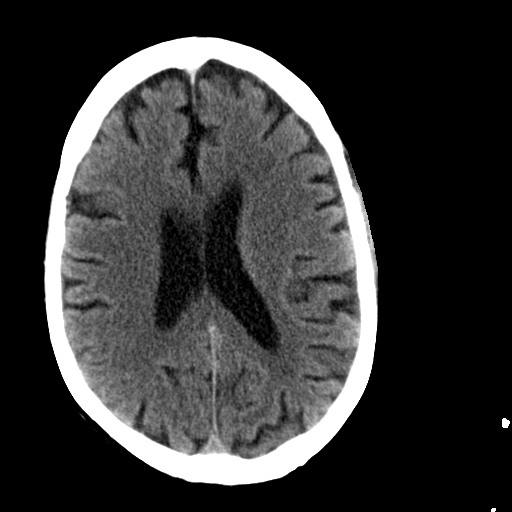
[im 20/32  brain]
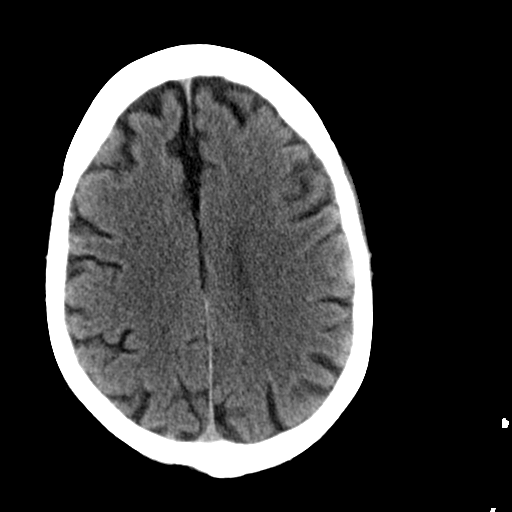
[im 20/32  bone]
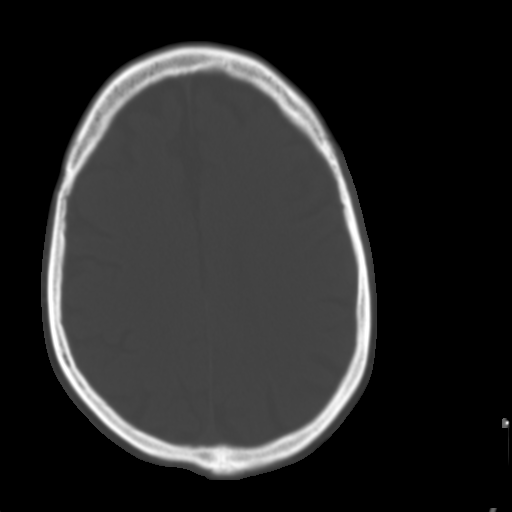
[im 23/32  brain]
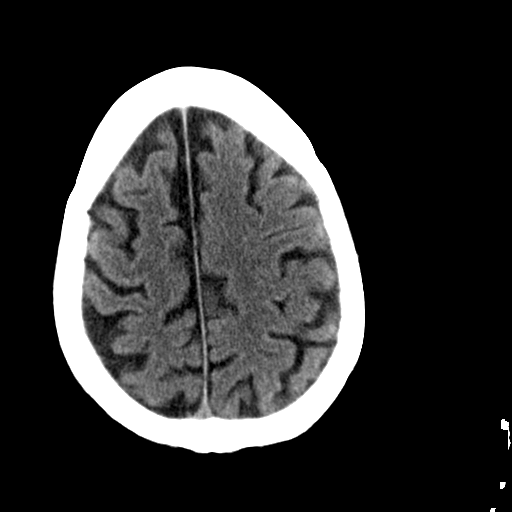
[im 25/32  brain]
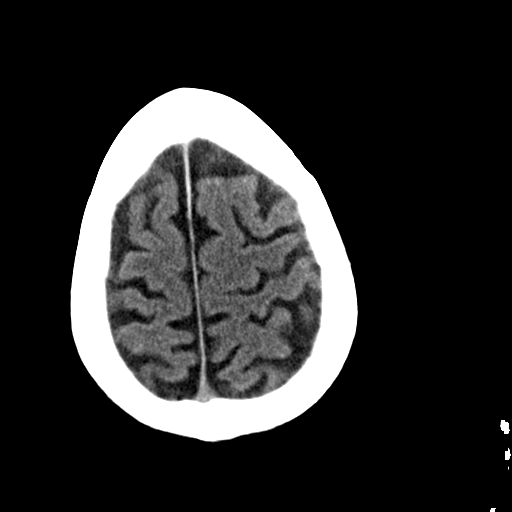
[im 27/32  brain]
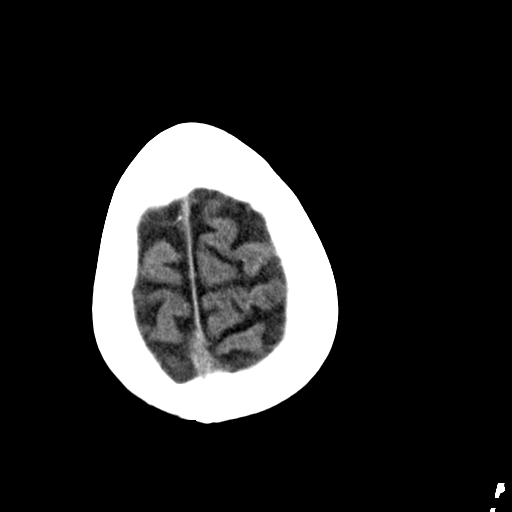
[im 29/32  brain]
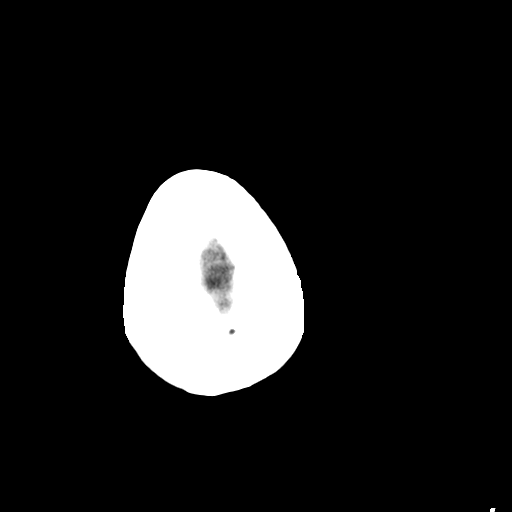
[im 29/32  bone]
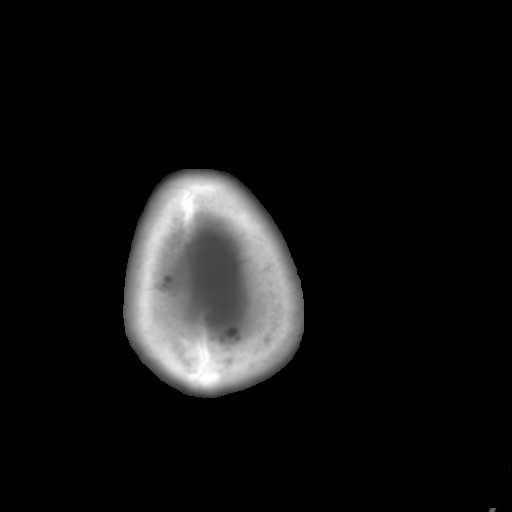

[Series 3: bone · axial · 0.43mm/px · z∈[-170,-124]mm · 3 of 32 slices shown]
[im 3/32  bone]
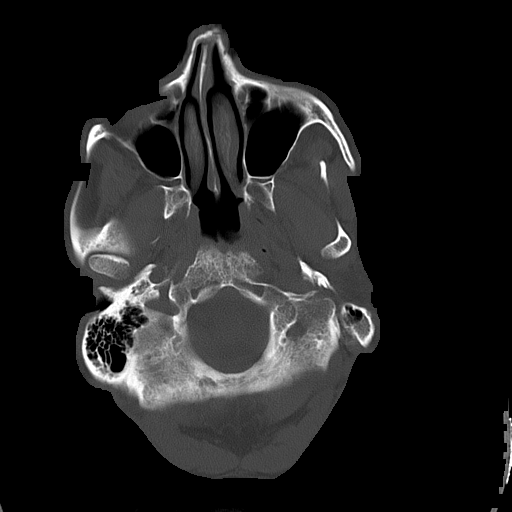
[im 7/32  bone]
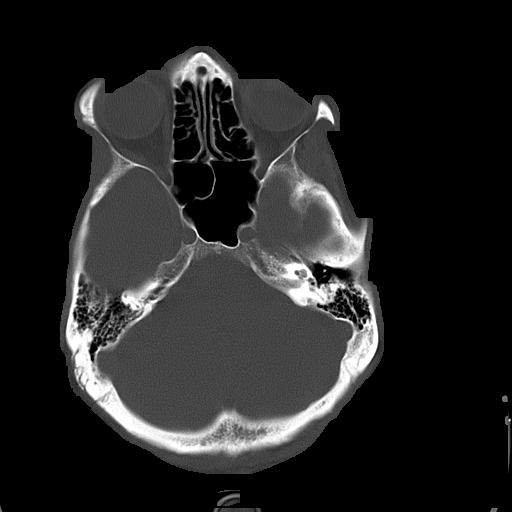
[im 12/32  bone]
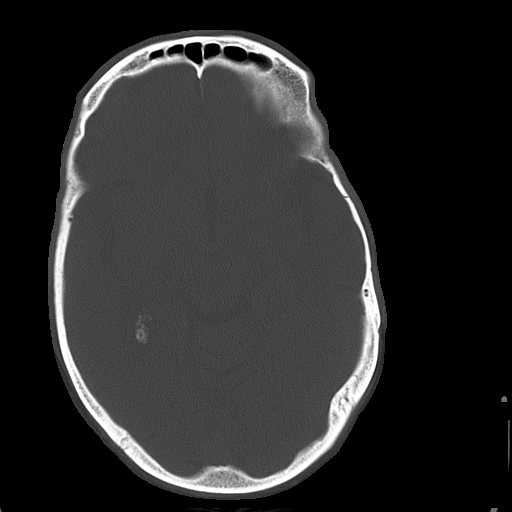

[16 of 30 positions shown; findings below may reference images not displayed]

FINDINGS: There is no evidence for mass effect, midline shift, or extra-axial fluid
collections. There is no evidence for space-occupying lesion, intracranial
hemorrhage, or cortical-based area of infarction. There is mild cerebral
atrophy.

The osseous structures are unremarkable.
IMPRESSION: No acute intracranial process.

## 2014-12-07 ENCOUNTER — Encounter
Admission: RE | Disposition: A | Discharge status: Home / Self Care | Payer: Self-pay | Source: Ambulatory Visit | Attending: Unknown Physician Specialty

## 2014-12-07 ENCOUNTER — Ambulatory Visit
Admission: RE | Admit: 2014-12-07 | Discharge: 2014-12-07 | Disposition: A | Discharge status: Home / Self Care | Payer: Medicare Other | Source: Ambulatory Visit | Attending: Unknown Physician Specialty | Admitting: Unknown Physician Specialty

## 2014-12-07 ENCOUNTER — Ambulatory Visit: Payer: Medicare Other | Admitting: Anesthesiology

## 2014-12-07 ENCOUNTER — Encounter: Payer: Self-pay | Admitting: *Deleted

## 2014-12-07 DIAGNOSIS — K621 Rectal polyp: Secondary | ICD-10-CM | POA: Insufficient documentation

## 2014-12-07 DIAGNOSIS — K2970 Gastritis, unspecified, without bleeding: Secondary | ICD-10-CM | POA: Insufficient documentation

## 2014-12-07 DIAGNOSIS — E039 Hypothyroidism, unspecified: Secondary | ICD-10-CM | POA: Insufficient documentation

## 2014-12-07 DIAGNOSIS — Z792 Long term (current) use of antibiotics: Secondary | ICD-10-CM | POA: Diagnosis not present

## 2014-12-07 DIAGNOSIS — I252 Old myocardial infarction: Secondary | ICD-10-CM | POA: Insufficient documentation

## 2014-12-07 DIAGNOSIS — K319 Disease of stomach and duodenum, unspecified: Secondary | ICD-10-CM | POA: Diagnosis not present

## 2014-12-07 DIAGNOSIS — D125 Benign neoplasm of sigmoid colon: Secondary | ICD-10-CM | POA: Insufficient documentation

## 2014-12-07 DIAGNOSIS — J45909 Unspecified asthma, uncomplicated: Secondary | ICD-10-CM | POA: Diagnosis not present

## 2014-12-07 DIAGNOSIS — K648 Other hemorrhoids: Secondary | ICD-10-CM | POA: Insufficient documentation

## 2014-12-07 DIAGNOSIS — K210 Gastro-esophageal reflux disease with esophagitis: Secondary | ICD-10-CM | POA: Diagnosis not present

## 2014-12-07 DIAGNOSIS — K2270 Barrett's esophagus without dysplasia: Secondary | ICD-10-CM | POA: Diagnosis not present

## 2014-12-07 DIAGNOSIS — Z79899 Other long term (current) drug therapy: Secondary | ICD-10-CM | POA: Insufficient documentation

## 2014-12-07 DIAGNOSIS — F17210 Nicotine dependence, cigarettes, uncomplicated: Secondary | ICD-10-CM | POA: Insufficient documentation

## 2014-12-07 DIAGNOSIS — Z8673 Personal history of transient ischemic attack (TIA), and cerebral infarction without residual deficits: Secondary | ICD-10-CM | POA: Diagnosis not present

## 2014-12-07 DIAGNOSIS — D123 Benign neoplasm of transverse colon: Secondary | ICD-10-CM | POA: Insufficient documentation

## 2014-12-07 DIAGNOSIS — K921 Melena: Secondary | ICD-10-CM | POA: Diagnosis present

## 2014-12-07 HISTORY — DX: Unspecified asthma, uncomplicated: J45.909

## 2014-12-07 HISTORY — PX: COLONOSCOPY WITH PROPOFOL: SHX5780

## 2014-12-07 HISTORY — PX: ESOPHAGOGASTRODUODENOSCOPY: SHX5428

## 2014-12-07 HISTORY — DX: Unspecified convulsions: R56.9

## 2014-12-07 HISTORY — DX: Acute myocardial infarction, unspecified: I21.9

## 2014-12-07 HISTORY — DX: Cerebral infarction, unspecified: I63.9

## 2014-12-07 SURGERY — COLONOSCOPY WITH PROPOFOL
Anesthesia: General

## 2014-12-07 MED ORDER — EPHEDRINE SULFATE 50 MG/ML IJ SOLN
INTRAMUSCULAR | Status: DC | PRN
  Administered 2014-12-07: 5 mg via INTRAVENOUS

## 2014-12-07 MED ORDER — SODIUM CHLORIDE 0.9 % IV SOLN
INTRAVENOUS | Status: DC
  Administered 2014-12-07: 14:00:00 via INTRAVENOUS

## 2014-12-07 MED ORDER — PHENYLEPHRINE HCL 10 MG/ML IJ SOLN
INTRAMUSCULAR | Status: DC | PRN
  Administered 2014-12-07 (×4): 100 ug via INTRAVENOUS

## 2014-12-07 MED ORDER — SODIUM CHLORIDE 0.9 % IJ SOLN
1000.0000 mL | Freq: Once | INTRAMUSCULAR | Status: AC
  Administered 2014-12-07: 1000 mL via INTRAVENOUS

## 2014-12-07 MED ORDER — LIDOCAINE HCL (CARDIAC) 20 MG/ML IV SOLN
INTRAVENOUS | Status: DC | PRN
  Administered 2014-12-07: 100 mg via INTRAVENOUS

## 2014-12-07 MED ORDER — MIDAZOLAM HCL 5 MG/5ML IJ SOLN
INTRAMUSCULAR | Status: DC | PRN
  Administered 2014-12-07: 1 mg via INTRAVENOUS

## 2014-12-07 MED ORDER — PROPOFOL INFUSION 10 MG/ML OPTIME
INTRAVENOUS | Status: DC | PRN
  Administered 2014-12-07: 25 ug/kg/min via INTRAVENOUS
  Administered 2014-12-07: 120 ug/kg/min via INTRAVENOUS

## 2014-12-07 MED ORDER — FENTANYL CITRATE (PF) 100 MCG/2ML IJ SOLN
INTRAMUSCULAR | Status: DC | PRN
  Administered 2014-12-07: 50 ug via INTRAVENOUS

## 2014-12-07 MED ORDER — GLYCOPYRROLATE 0.2 MG/ML IJ SOLN
INTRAMUSCULAR | Status: DC | PRN
  Administered 2014-12-07: 0.2 mg via INTRAVENOUS

## 2014-12-07 MED ORDER — PROPOFOL 10 MG/ML IV BOLUS
INTRAVENOUS | Status: DC | PRN
  Administered 2014-12-07: 12 mg via INTRAVENOUS
  Administered 2014-12-07: 50 mg via INTRAVENOUS

## 2014-12-07 NOTE — H&P (Signed)
Primary Care Physician:  [name], MD Primary Gastroenterologist:  Dr. Vira Agar  Pre-Procedure History & Physical: HPI:  Alan Small is a 59 y.o. male is here for an endoscopy and colonoscopy.   Past Medical History  Diagnosis Date  . Mental disorder   . Hypothyroidism   . Seizures   . Myocardial infarction   . Stroke   . Asthma     Past Surgical History  Procedure Laterality Date  . Appendectomy      Prior to Admission medications   Medication Sig Start Date End Date Taking? Authorizing Provider  albuterol-ipratropium (COMBIVENT) 18-103 MCG/ACT inhaler Inhale 2 puffs into the lungs every 6 (six) hours as needed for wheezing.   Yes Historical Provider, MD  diphenhydrAMINE (BENADRYL) 50 MG capsule Take 50 mg by mouth at bedtime.   Yes Historical Provider, MD  FLUoxetine (PROZAC) 40 MG capsule Take 40 mg by mouth daily.   Yes Historical Provider, MD  levothyroxine (SYNTHROID, LEVOTHROID) 50 MCG tablet Take 50 mcg by mouth daily before breakfast.   Yes Historical Provider, MD  risperiDONE (RISPERDAL) 3 MG tablet Take 3 mg by mouth at bedtime.   Yes Historical Provider, MD  senna (SENOKOT) 8.6 MG TABS Take 1 tablet by mouth 2 (two) times daily as needed (constipation).   Yes Historical Provider, MD  amoxicillin (AMOXIL) 500 MG capsule Take 1 capsule (500 mg total) by mouth every 12 (twelve) hours. Patient not taking: Reported on 12/07/2014 11/11/12   Nita Sells, MD  benztropine (COGENTIN) 2 MG tablet Take 2 mg by mouth 2 (two) times daily.    Historical Provider, MD  fluPHENAZine decanoate (PROLIXIN) 25 MG/ML injection Inject 5 mg into the muscle every 14 (fourteen) days. Due 11/08/12    Historical Provider, MD  sodium bicarbonate 650 MG tablet Take 2 tablets (1,300 mg total) by mouth 2 (two) times daily. Patient not taking: Reported on 12/07/2014 11/11/12   Nita Sells, MD    Allergies as of 10/27/2014 - Review Complete 11/04/2012  Allergen Reaction Noted   . Mellaril [thioridazine] Other (See Comments) 11/03/2012  . Morphine and related Itching 11/03/2012  . Navane [thiothixene] Other (See Comments) 11/03/2012  . Thorazine [chlorpromazine] Other (See Comments) 11/03/2012    Family History  Problem Relation Age of Onset  . Stroke Father     History   Social History  . Marital Status: Single    Spouse Name: N/A  . Number of Children: N/A  . Years of Education: N/A   Occupational History  . Not on file.   Social History Main Topics  . Smoking status: Current Every Day Smoker -- 1.50 packs/day for 55 years    Types: Cigarettes  . Smokeless tobacco: Former Systems developer    Types: Chew  . Alcohol Use: No  . Drug Use: No  . Sexual Activity: Not on file   Other Topics Concern  . Not on file   Social History Narrative    Review of Systems: See HPI, otherwise negative ROS  Physical Exam: BP 154/117 mmHg  Pulse 87  Temp(Src) 97.1 F (36.2 C) (Tympanic)  Resp 20  SpO2 97% General:   Alert,  pleasant and cooperative in NAD Head:  Normocephalic and atraumatic. Neck:  Supple; no masses or thyromegaly. Lungs:  Clear throughout to auscultation.    Heart:  Regular rate and rhythm. Abdomen:  Soft, nontender and nondistended. Normal bowel sounds, without guarding, and without rebound.   Neurologic:  Alert and  oriented x4;  grossly normal neurologically.  Impression/Plan: [name] is here for an endoscopy and colonoscopy to be performed for rectal bleeding, GERD, melena   Risks, benefits, limitations, and alternatives regarding  endoscopy and colonoscopy have been reviewed with the patient.  Questions have been answered.  All parties agreeable.   Gaylyn Cheers, MD  12/07/2014, 1:58 PM   Primary Care Physician:  [name], MD Primary Gastroenterologist:  Dr. Vira Agar  Pre-Procedure History & Physical: HPI:  Alan Small is a 59 y.o. male is here for an endoscopy and colonoscopy.   Past Medical History   Diagnosis Date  . Mental disorder   . Hypothyroidism   . Seizures   . Myocardial infarction   . Stroke   . Asthma     Past Surgical History  Procedure Laterality Date  . Appendectomy      Prior to Admission medications   Medication Sig Start Date End Date Taking? Authorizing Provider  albuterol-ipratropium (COMBIVENT) 18-103 MCG/ACT inhaler Inhale 2 puffs into the lungs every 6 (six) hours as needed for wheezing.   Yes Historical Provider, MD  diphenhydrAMINE (BENADRYL) 50 MG capsule Take 50 mg by mouth at bedtime.   Yes Historical Provider, MD  FLUoxetine (PROZAC) 40 MG capsule Take 40 mg by mouth daily.   Yes Historical Provider, MD  levothyroxine (SYNTHROID, LEVOTHROID) 50 MCG tablet Take 50 mcg by mouth daily before breakfast.   Yes Historical Provider, MD  risperiDONE (RISPERDAL) 3 MG tablet Take 3 mg by mouth at bedtime.   Yes Historical Provider, MD  senna (SENOKOT) 8.6 MG TABS Take 1 tablet by mouth 2 (two) times daily as needed (constipation).   Yes Historical Provider, MD  amoxicillin (AMOXIL) 500 MG capsule Take 1 capsule (500 mg total) by mouth every 12 (twelve) hours. Patient not taking: Reported on 12/07/2014 11/11/12   Nita Sells, MD  benztropine (COGENTIN) 2 MG tablet Take 2 mg by mouth 2 (two) times daily.    Historical Provider, MD  fluPHENAZine decanoate (PROLIXIN) 25 MG/ML injection Inject 5 mg into the muscle every 14 (fourteen) days. Due 11/08/12    Historical Provider, MD  sodium bicarbonate 650 MG tablet Take 2 tablets (1,300 mg total) by mouth 2 (two) times daily. Patient not taking: Reported on 12/07/2014 11/11/12   Nita Sells, MD    Allergies as of 10/27/2014 - Review Complete 11/04/2012  Allergen Reaction Noted  . Mellaril [thioridazine] Other (See Comments) 11/03/2012  . Morphine and related Itching 11/03/2012  . Navane [thiothixene] Other (See Comments) 11/03/2012  . Thorazine [chlorpromazine] Other (See Comments) 11/03/2012    Family  History  Problem Relation Age of Onset  . Stroke Father     History   Social History  . Marital Status: Single    Spouse Name: N/A  . Number of Children: N/A  . Years of Education: N/A   Occupational History  . Not on file.   Social History Main Topics  . Smoking status: Current Every Day Smoker -- 1.50 packs/day for 55 years    Types: Cigarettes  . Smokeless tobacco: Former Systems developer    Types: Chew  . Alcohol Use: No  . Drug Use: No  . Sexual Activity: Not on file   Other Topics Concern  . Not on file   Social History Narrative    Review of Systems: See HPI, otherwise negative ROS  Physical Exam: BP 154/117 mmHg  Pulse 87  Temp(Src) 97.1 F (36.2 C) (Tympanic)  Resp 20  SpO2  97% General:   Alert,  pleasant and cooperative in NAD Head:  Normocephalic and atraumatic. Neck:  Supple; no masses or thyromegaly. Lungs:  Clear throughout to auscultation.    Heart:  Regular rate and rhythm. Abdomen:  Soft, nontender and nondistended. Normal bowel sounds, without guarding, and without rebound.   Neurologic:  Alert and  oriented x4;  grossly normal neurologically.  Impression/Plan: VIKRANT DELOERA is here for an endoscopy and colonoscopy to be performed for rectal bleeding, melena, GERD  Risks, benefits, limitations, and alternatives regarding  endoscopy and colonoscopy have been reviewed with the patient.  Questions have been answered.  All parties agreeable.   Gaylyn Cheers, MD  12/07/2014, 1:58 PM

## 2014-12-07 NOTE — Op Note (Signed)
[facility] Gastroenterology Patient Name: Alan Small Procedure Date: 12/07/2014 2:25 PM MRN: [id number] Account #: [id number] Date of Birth: 1955/12/15 Admit Type: Outpatient Age: 59 Room: [facility] ENDO ROOM 1 Gender: Male Note Status: Finalized Procedure:         Colonoscopy Indications:       Heme positive stool, Melena Providers:         Manya Silvas, MD Medicines:         Propofol per Anesthesia Complications:     No immediate complications. Procedure:         Pre-Anesthesia Assessment:                    - After reviewing the risks and benefits, the patient was                     deemed in satisfactory condition to undergo the procedure.                    After obtaining informed consent, the colonoscope was                     passed under direct vision. Throughout the procedure, the                     patient's blood pressure, pulse, and oxygen saturations                     were monitored continuously. The Colonoscope was                     introduced through the anus and advanced to the the cecum,                     identified by appendiceal orifice and ileocecal valve. The                     colonoscopy was performed without difficulty. The patient                     tolerated the procedure well. The quality of the bowel                     preparation was good. Findings:      A 6 mm polyp was found in the sigmoid colon. /descending colon. The       polyp was semi-pedunculated. The polyp was removed with a hot snare.       Resection and retrieval were complete.      A diminutive polyp was found in the rectum. The polyp was sessile. The       polyp was removed with a hot snare. Resection and retrieval were       complete.      Internal hemorrhoids were found during endoscopy. The hemorrhoids were       small and Grade I (internal hemorrhoids that do not prolapse).      A diminutive polyp was found at the hepatic flexure. The polyp was   sessile. The polyp was removed with a jumbo cold forceps. Resection and       retrieval were complete.      A diminutive polyp was found in the transverse colon. The polyp was       sessile. The polyp was removed with a jumbo cold forceps. Resection and  retrieval were complete.      A diminutive polyp was found in the transverse colon. The polyp was       sessile. The polyp was removed with a jumbo cold forceps. Resection and       retrieval were complete. Impression:        - One 6 mm polyp in the sigmoid colon. Resected and                     retrieved.                    - One diminutive polyp in the rectum. Resected and                     retrieved.                    - Internal hemorrhoids.                    - One diminutive polyp at the hepatic flexure. Resected                     and retrieved.                    - One diminutive polyp in the transverse colon. Resected                     and retrieved.                    - One diminutive polyp in the transverse colon. Resected                     and retrieved. Recommendation:    - Await pathology results. Manya Silvas, MD 12/07/2014 2:51:22 PM This report has been signed electronically. Number of Addenda: 0 Note Initiated On: 12/07/2014 2:25 PM Scope Withdrawal Time: 0 hours 14 minutes 31 seconds  Total Procedure Duration: 0 hours 19 minutes 22 seconds       [facility]

## 2014-12-07 NOTE — OR Nursing (Signed)
Patient was sprayed with cetacaine spray at 1408.

## 2014-12-07 NOTE — Anesthesia Preprocedure Evaluation (Signed)
Anesthesia Evaluation  Patient identified by MRN, date of birth, ID band Patient awake    Reviewed: Allergy & Precautions, H&P , NPO status , Patient's Chart, lab work & pertinent test results, reviewed documented beta blocker date and time   History of Anesthesia Complications Negative for: history of anesthetic complications  Airway Mallampati: II  TM Distance: >3 FB Neck ROM: full    Dental  (+) Edentulous Upper, Edentulous Lower   Pulmonary shortness of breath, asthma , COPD breath sounds clear to auscultation  Pulmonary exam normal       Cardiovascular Exercise Tolerance: Good + CAD and + Past MI Normal cardiovascular examRhythm:regular Rate:Normal     Neuro/Psych PSYCHIATRIC DISORDERS negative neurological ROS     GI/Hepatic negative GI ROS, Neg liver ROS,   Endo/Other  Hypothyroidism   Renal/GU CRFRenal diseasenegative Renal ROS  negative genitourinary   Musculoskeletal   Abdominal   Peds  Hematology negative hematology ROS (+)   Anesthesia Other Findings   Reproductive/Obstetrics negative OB ROS                             Anesthesia Physical Anesthesia Plan  ASA: III  Anesthesia Plan: General   Post-op Pain Management:    Induction:   Airway Management Planned:   Additional Equipment:   Intra-op Plan:   Post-operative Plan:   Informed Consent: I have reviewed the patients History and Physical, chart, labs and discussed the procedure including the risks, benefits and alternatives for the proposed anesthesia with the patient or authorized representative who has indicated his/her understanding and acceptance.   Dental Advisory Given  Plan Discussed with: Anesthesiologist, CRNA and Surgeon  Anesthesia Plan Comments:         Anesthesia Quick Evaluation

## 2014-12-07 NOTE — Transfer of Care (Signed)
Immediate Anesthesia Transfer of Care Note  Patient: Alan Small  Procedure(s) Performed: Procedure(s): COLONOSCOPY WITH PROPOFOL (N/A) ESOPHAGOGASTRODUODENOSCOPY (EGD) (N/A)  Patient Location: PACU  Anesthesia Type:General  Level of Consciousness: awake  Airway & Oxygen Therapy: Patient Spontanous Breathing and Patient connected to nasal cannula oxygen  Post-op Assessment: Report given to RN and Post -op Vital signs reviewed and stable  Post vital signs: Reviewed and stable  Last Vitals:  Filed Vitals:   12/07/14 1456  BP: 93/76  Pulse:   Temp: 36 C  Resp: 17    Complications: No apparent anesthesia complications

## 2014-12-07 NOTE — Op Note (Signed)
[facility] Gastroenterology Patient Name: Micki Birdsong Procedure Date: 12/07/2014 2:02 PM MRN: [id number] Account #: [id number] Date of Birth: 1956-03-01 Admit Type: Outpatient Age: 59 Room: [facility] ENDO ROOM 1 Gender: Male Note Status: Finalized Procedure:         Upper GI endoscopy Indications:       Melena Providers:         Manya Silvas, MD Referring MD:      Baxter Kail. Bender (Referring MD) Medicines:         Propofol per Anesthesia Complications:     No immediate complications. Procedure:         Pre-Anesthesia Assessment:                    - After reviewing the risks and benefits, the patient was                     deemed in satisfactory condition to undergo the procedure.                    After obtaining informed consent, the endoscope was passed                     under direct vision. Throughout the procedure, the                     patient's blood pressure, pulse, and oxygen saturations                     were monitored continuously. The Olympus GIF-160 endoscope                     (S#. [id number]) was introduced through the mouth, and                     advanced to the second part of duodenum. The upper GI                     endoscopy was accomplished without difficulty. The patient                     tolerated the procedure well. Findings:      There were esophageal mucosal changes suspicious for short-segment       Barrett's esophagus present in the lower third of the esophagus. The       maximum longitudinal extent of these mucosal changes was 3 cm in length.       Mucosa was biopsied with a cold forceps for histology. One specimen       bottle was sent to pathology.      A single medium-sized angioectasia with no bleeding was found on the       lesser curvature of the gastric body. Coagulation for bleeding       prevention using argon plasma was successful.      Diffuse mild inflammation characterized by erythema was found in the   gastric body. Biopsies were taken with a cold forceps for histology.       Biopsies were taken with a cold forceps for Helicobacter pylori testing.      The examined duodenum was normal. Impression:        - Esophageal mucosal changes suspicious for short-segment                     Barrett's esophagus.  Biopsied.                    - A single non-bleeding angioectasia in the stomach.                     Treated with argon plasma coagulation (APC).                    - Gastritis. Biopsied.                    - Normal examined duodenum. Recommendation:    - Await pathology results. Manya Silvas, MD 12/07/2014 2:25:52 PM This report has been signed electronically. Number of Addenda: 0 Note Initiated On: 12/07/2014 2:02 PM      [facility]

## 2014-12-08 ENCOUNTER — Encounter: Payer: Self-pay | Admitting: Unknown Physician Specialty

## 2014-12-08 MED FILL — Sodium Chloride IV Soln 0.9%: INTRAVENOUS | Qty: 1000 | Status: AC

## 2014-12-08 NOTE — Anesthesia Postprocedure Evaluation (Signed)
  Anesthesia Post-op Note  Patient: Alan Small  Procedure(s) Performed: Procedure(s): COLONOSCOPY WITH PROPOFOL (N/A) ESOPHAGOGASTRODUODENOSCOPY (EGD) (N/A)  Anesthesia type:General  Patient location: PACU  Post pain: Pain level controlled  Post assessment: Post-op Vital signs reviewed, Patient's Cardiovascular Status Stable, Respiratory Function Stable, Patent Airway and No signs of Nausea or vomiting  Post vital signs: Reviewed and stable  Last Vitals:  Filed Vitals:   12/07/14 1530  BP: 110/80  Pulse: 93  Temp:   Resp: 17    Level of consciousness: awake, alert  and patient cooperative  Complications: No apparent anesthesia complications

## 2014-12-11 LAB — SURGICAL PATHOLOGY

## 2014-12-24 ENCOUNTER — Encounter: Payer: Self-pay | Admitting: Emergency Medicine

## 2014-12-24 ENCOUNTER — Emergency Department
Admission: EM | Admit: 2014-12-24 | Discharge: 2014-12-24 | Disposition: A | Discharge status: Home / Self Care | Payer: Medicare Other | Attending: Emergency Medicine | Admitting: Emergency Medicine

## 2014-12-24 DIAGNOSIS — N390 Urinary tract infection, site not specified: Secondary | ICD-10-CM | POA: Diagnosis not present

## 2014-12-24 DIAGNOSIS — Z79899 Other long term (current) drug therapy: Secondary | ICD-10-CM | POA: Diagnosis not present

## 2014-12-24 DIAGNOSIS — F22 Delusional disorders: Secondary | ICD-10-CM | POA: Diagnosis not present

## 2014-12-24 DIAGNOSIS — Z046 Encounter for general psychiatric examination, requested by authority: Secondary | ICD-10-CM | POA: Diagnosis present

## 2014-12-24 DIAGNOSIS — Z720 Tobacco use: Secondary | ICD-10-CM | POA: Insufficient documentation

## 2014-12-24 DIAGNOSIS — F419 Anxiety disorder, unspecified: Secondary | ICD-10-CM | POA: Insufficient documentation

## 2014-12-24 LAB — CBC
HCT: 36.6 % — ABNORMAL LOW (ref 40.0–52.0)
HEMOGLOBIN: 12.1 g/dL — AB (ref 13.0–18.0)
MCH: 30.2 pg (ref 26.0–34.0)
MCHC: 33.1 g/dL (ref 32.0–36.0)
MCV: 91.2 fL (ref 80.0–100.0)
Platelets: 119 10*3/uL — ABNORMAL LOW (ref 150–440)
RBC: 4.02 MIL/uL — AB (ref 4.40–5.90)
RDW: 14.9 % — AB (ref 11.5–14.5)
WBC: 14.9 10*3/uL — ABNORMAL HIGH (ref 3.8–10.6)

## 2014-12-24 LAB — URINE DRUG SCREEN, QUALITATIVE (ARMC ONLY)
AMPHETAMINES, UR SCREEN: NOT DETECTED
BARBITURATES, UR SCREEN: NOT DETECTED
Benzodiazepine, Ur Scrn: NOT DETECTED
COCAINE METABOLITE, UR ~~LOC~~: NOT DETECTED
Cannabinoid 50 Ng, Ur ~~LOC~~: POSITIVE — AB
MDMA (Ecstasy)Ur Screen: NOT DETECTED
Methadone Scn, Ur: NOT DETECTED
OPIATE, UR SCREEN: NOT DETECTED
Phencyclidine (PCP) Ur S: NOT DETECTED
Tricyclic, Ur Screen: NOT DETECTED

## 2014-12-24 LAB — COMPREHENSIVE METABOLIC PANEL
ALT: 13 U/L — AB (ref 17–63)
AST: 24 U/L (ref 15–41)
Albumin: 3.6 g/dL (ref 3.5–5.0)
Alkaline Phosphatase: 98 U/L (ref 38–126)
Anion gap: 8 (ref 5–15)
BUN: 39 mg/dL — ABNORMAL HIGH (ref 6–20)
CO2: 22 mmol/L (ref 22–32)
Calcium: 9.1 mg/dL (ref 8.9–10.3)
Chloride: 110 mmol/L (ref 101–111)
Creatinine, Ser: 2.42 mg/dL — ABNORMAL HIGH (ref 0.61–1.24)
GFR calc Af Amer: 32 mL/min — ABNORMAL LOW (ref >60)
GFR calc non Af Amer: 27 mL/min — ABNORMAL LOW (ref >60)
Glucose, Bld: 76 mg/dL (ref 65–99)
Potassium: 4.6 mmol/L (ref 3.5–5.1)
SODIUM: 140 mmol/L (ref 135–145)
Total Bilirubin: 0.7 mg/dL (ref 0.3–1.2)
Total Protein: 6.9 g/dL (ref 6.5–8.1)

## 2014-12-24 LAB — URINALYSIS COMPLETE WITH MICROSCOPIC (ARMC ONLY)
Bilirubin Urine: NEGATIVE
Bilirubin Urine: NEGATIVE
GLUCOSE, UA: NEGATIVE mg/dL
Glucose, UA: NEGATIVE mg/dL
KETONES UR: NEGATIVE mg/dL
Ketones, ur: NEGATIVE mg/dL
NITRITE: NEGATIVE
Nitrite: NEGATIVE
PH: 7 (ref 5.0–8.0)
PROTEIN: NEGATIVE mg/dL
Protein, ur: 30 mg/dL — AB
SPECIFIC GRAVITY, URINE: 1.008 (ref 1.005–1.030)
Specific Gravity, Urine: 1.008 (ref 1.005–1.030)
Squamous Epithelial / LPF: NONE SEEN
pH: 7 (ref 5.0–8.0)

## 2014-12-24 LAB — ETHANOL: Alcohol, Ethyl (B): <5 mg/dL (ref <5)

## 2014-12-24 LAB — SALICYLATE LEVEL

## 2014-12-24 LAB — ACETAMINOPHEN LEVEL: Acetaminophen (Tylenol), Serum: <10 ug/mL — ABNORMAL LOW (ref 10–30)

## 2014-12-24 MED ORDER — CIPROFLOXACIN HCL 500 MG PO TABS
750.0000 mg | ORAL_TABLET | Freq: Once | ORAL | Status: AC
  Administered 2014-12-24: 750 mg via ORAL
  Filled 2014-12-24: qty 1
  Filled 2014-12-24: qty 1.5

## 2014-12-24 MED ORDER — CIPROFLOXACIN HCL 500 MG PO TABS
ORAL_TABLET | ORAL | Status: AC
  Administered 2014-12-24: 750 mg via ORAL
  Filled 2014-12-24: qty 1

## 2014-12-24 MED ORDER — CIPROFLOXACIN HCL 500 MG PO TABS: 500.0000 mg | ORAL_TABLET | Freq: Two times a day (BID) | ORAL | Status: AC

## 2014-12-24 NOTE — ED Notes (Signed)
Pt sitting up in strecher located in 1 hallway, no distress noted.

## 2014-12-24 NOTE — Discharge Instructions (Signed)
Catheter-Associated Urinary Tract Infection FAQs °WHAT IS "CATHETER-ASSOCIATED" URINARY TRACT INFECTION? °A urinary tract infection (also called "UTI") is an infection in the urinary system, which includes the bladder (which stores the urine) and the kidneys (which filter the blood to make urine). Germs (for example, bacteria or yeasts) do not normally live in these areas; but if germs are introduced, an infection can occur. If you have a urinary catheter, germs can travel along the catheter and cause an infection in your bladder or your kidney; in that case it is called a catheter-associated urinary tract infection (or "CA-UTI").  °WHAT IS A URINARY CATHETER? °A urinary catheter is a thin tube placed in the bladder to drain urine. Urine drains through the tube into a bag that collects the urine. A urinary  °catheter may be used: °· If you are not able to urinate on your own. °· To measure the amount of urine that you make, for example, during intensive care. °· During and after some types of surgery. °· During some tests of the kidneys and bladder . °People with urinary catheters have a much higher chance of getting a urinary tract infection than people who don't have a catheter. °HOW DO I GET A CATHETER-ASSOCIATED URINARY TRACT INFECTION (CA-UTI)? °If germs enter the urinary tract, they may cause an infection. Many of the germs that cause a catheter-associated urinary tract infection are common germs found in your intestines that do not usually cause an infection there. Germs can enter the urinary tract when the catheter is being put in or while the catheter remains in the bladder.  °WHAT ARE THE SYMPTOMS OF A URINARY TRACT INFECTION?  °Some of the common symptoms of a urinary tract infection are: °· Burning or pain in the lower abdomen (that is, below the stomach). °· Fever. °· Bloody urine may be a sign of infection, but is also caused by other problems . °· Burning during urination or an increase in the  frequency of urination after the catheter is removed. °Sometimes people with catheter-associated urinary tract infections do not have these symptoms of infection. °CAN CATHETER-ASSOCIATED URINARY TRACT INFECTIONS BE TREATED? °Yes, most catheter-associated urinary tract infections can be treated with antibiotics and removal or change of the catheter. Your doctor will determine which antibiotic is best for you.  °WHAT ARE SOME OF THE THINGS THAT HOSPITALS ARE DOING TO PREVENT CATHETER-ASSOCIATED URINARY TRACT INFECTIONS? °To prevent urinary tract infections, doctors and nurses take the following actions.  °Catheter insertion °· Catheters are put in only when necessary and they are removed as soon as possible. °· Only properly trained persons insert catheters using sterile ("clean") technique. °· The skin in the area where the catheter will be inserted is cleaned before inserting the catheter. °· Other methods to drain the urine are sometimes used, such as: °¨ External catheters in men (these look like condoms and are placed over the penis rather than into the penis) °¨ Putting a temporary catheter in to drain the urine and removing it right away. This is called intermittent urethral catheterization. °Catheter care °· Healthcare providers clean their hands by washing them with soap and water or using an alcohol-based hand rub before and after touching your catheter. °¨ If you do not see your providers clean their hands, please ask them to do so. °· Avoid disconnecting the catheter and drain tube. This helps to prevent germs from getting into the catheter tube. °· The catheter is secured to the leg to prevent pulling on the   catheter.  Avoid twisting or kinking the catheter.  Keep the bag lower than the bladder to prevent urine from backflowing to the bladder.  Empty the bag regularly. The drainage spout should not touch anything while emptying the bag. WHAT CAN I DO TO HELP PREVENT CATHETER-ASSOCIATED URINARY  TRACT INFECTIONS IF I HAVE A CATHETER?  Always clean your hands before and after doing catheter care.  Always keep your urine bag below the level of your bladder.  Do not tug or pull on the tubing.  Do not twist or kink the catheter tubing.  Ask your healthcare provider each day if you still need the catheter. WHAT DO I NEED TO DO WHEN I [place]?  If you will be going home with a catheter, your doctor or nurse should explain everything you need to know about taking care of the catheter. Make sure you understand how to care for it before you leave the hospital.  If you develop any of the symptoms of a urinary tract infection, such as burning or pain in the lower abdomen, fever, or an increase in the frequency of urination, contact your doctor or nurse immediately.  Before you go home, make sure you know who to contact if you have questions or problems after you get home. If you have questions, please ask your doctor or nurse. Developed and co-sponsored by [employer] for [place] [phone]); Infectious Diseases Society of [place] (IDSA); [employer]; Association for Professionals in Infection Control and Epidemiology (APIC); Center for Disease Control (CDC); and The Joint Commission Document Released: 02/11/2012 Document Reviewed: 02/11/2012 [facility] Patient Information 2015 [place]. This information is not intended to replace advice given to you by your health care provider. Make sure you discuss any questions you have with your health care provider. PLEASE FOLLOW UP WITH YOUR DOCTORS. RETURN AS NEEDED, ESPECIALLY FOR FEVER, VOMITING OR FEELING SICKER.

## 2014-12-24 NOTE — BH Assessment (Signed)
Counselor spoke with Brett Canales from "[facility]" at [phone].    Group home that states that she does not know why the patient is here. Brett Canales states that the patient signed out around 8 PM last night to take a walk which is "normal for him." Brett Canales states that she spoke with other staff and residents about events that may have occurred and nothing out of ordinary was reported. Brett Canales states that the patient has been in the group home for the past 7 months and there have not been any problems. Brett Canales states that the patient is allowed to return and there will be staff available today to pick up him to return to the group home if he is discharged today.

## 2014-12-24 NOTE — ED Provider Notes (Signed)
-----------------------------------------   7:45 AM on 12/24/2014 -----------------------------------------   Blood pressure 120/73, pulse 97, temperature 97.5 F (36.4 C), temperature source Oral, resp. rate 20, height 6' (1.829 m), weight 185 lb (83.915 kg), SpO2 97 %.  The patient had no acute events since last update.  Calm and cooperative at this time.  Disposition is pending per Psychiatry/Behavioral Medicine team recommendations.     Nena Polio, MD 12/24/14 [id number]

## 2014-12-24 NOTE — ED Provider Notes (Signed)
[facility] Emergency Department Provider Note  ____________________________________________  Time seen: Approximately 624 AM  I have reviewed the triage vital signs and the nursing notes.   HISTORY  Chief Complaint Psychiatric Evaluation    HPI Alan Small is a 59 y.o. male who comes in reporting that he is a nervous wreck because he has heard some illegal things happening. The patient reports he went to the police and they're looking into it raised nervous for his life. The patient reports that he has been hearing these things for his entire life. He also reports that he has been hearing auditory hallucinations. The patient reports that he has been taking his medications which include Risperdal but he is nervous. The patient denies any suicidal ideation and reports that if anyone tries to harm him he'll get them first but no distinct homicidal ideation. The patient reports he has pain in his urinary tract.   Past Medical History  Diagnosis Date  . Mental disorder   . Hypothyroidism   . Seizures   . Myocardial infarction   . Stroke   . Asthma     Patient Active Problem List   Diagnosis Date Noted  . Metabolic acidosis 0000000  . Acute renal failure 11/03/2012  . Hyperkalemia 11/03/2012  . Pyelonephritis 11/03/2012  . Schizophrenia 11/03/2012  . Hypothyroidism 11/03/2012  . Severe sepsis 11/03/2012  . Protein-calorie malnutrition, severe 11/03/2012  . Encephalopathy acute 11/03/2012    Past Surgical History  Procedure Laterality Date  . Appendectomy    . Colonoscopy with propofol N/A 12/07/2014    Procedure: COLONOSCOPY WITH PROPOFOL;  Surgeon: Manya Silvas, MD;  Location: [facility] ENDOSCOPY;  Service: Endoscopy;  Laterality: N/A;  . Esophagogastroduodenoscopy N/A 12/07/2014    Procedure: ESOPHAGOGASTRODUODENOSCOPY (EGD);  Surgeon: Manya Silvas, MD;  Location: [facility] ENDOSCOPY;  Service: Endoscopy;  Laterality: N/A;  . Nephrectomy       Current Outpatient Rx  Name  Route  Sig  Dispense  Refill  . albuterol-ipratropium (COMBIVENT) 18-103 MCG/ACT inhaler   Inhalation   Inhale 2 puffs into the lungs every 6 (six) hours as needed for wheezing.         . benztropine (COGENTIN) 2 MG tablet   Oral   Take 2 mg by mouth 2 (two) times daily.         . diphenhydrAMINE (BENADRYL) 50 MG capsule   Oral   Take 50 mg by mouth at bedtime.         [name] FLUoxetine (PROZAC) 40 MG capsule   Oral   Take 40 mg by mouth daily.         . fluPHENAZine decanoate (PROLIXIN) 25 MG/ML injection   Intramuscular   Inject 5 mg into the muscle every 14 (fourteen) days. Due 11/08/12         . levothyroxine (SYNTHROID, LEVOTHROID) 50 MCG tablet   Oral   Take 50 mcg by mouth daily before breakfast.         . risperiDONE (RISPERDAL) 3 MG tablet   Oral   Take 3 mg by mouth at bedtime.         . senna (SENOKOT) 8.6 MG TABS   Oral   Take 1 tablet by mouth 2 (two) times daily as needed (constipation).         . sodium bicarbonate 650 MG tablet   Oral   Take 2 tablets (1,300 mg total) by mouth 2 (two) times daily.   20 tablet  Allergies Mellaril; Morphine and related; Navane; and Thorazine  Family History  Problem Relation Age of Onset  . Stroke Father     Social History History  Substance Use Topics  . Smoking status: Current Every Day Smoker -- 1.50 packs/day for 55 years    Types: Cigarettes  . Smokeless tobacco: Former Systems developer    Types: Chew  . Alcohol Use: No    Review of Systems Constitutional: No fever/chills Eyes: No visual changes. ENT: No sore throat. Cardiovascular: Denies chest pain. Respiratory: Denies shortness of breath. Gastrointestinal: No abdominal pain.  No nausea, no vomiting.  No diarrhea.  No constipation. Genitourinary: pain with urination Musculoskeletal: Negative for back pain. Skin: Negative for rash. Neurological: Negative for headaches, focal weakness or numbness. Psych:  Anxiety, paranoia 10-point ROS otherwise negative.  ____________________________________________   PHYSICAL EXAM:  VITAL SIGNS: ED Triage Vitals  Enc Vitals Group     BP 12/24/14 0547 120/73 mmHg     Pulse Rate 12/24/14 0547 97     Resp 12/24/14 0547 20     Temp 12/24/14 0547 97.5 F (36.4 C)     Temp Source 12/24/14 0547 Oral     SpO2 12/24/14 0547 97 %     Weight 12/24/14 0547 185 lb (83.915 kg)     Height 12/24/14 0547 6' (1.829 m)     Head Cir --      Peak Flow --      Pain Score 12/24/14 0547 10     Pain Loc --      Pain Edu? --      Excl. in [place]? --     Constitutional: Alert and oriented. Well appearing and in no acute distress. Eyes: Conjunctivae are normal. PERRL. EOMI. Head: Atraumatic. Nose: No congestion/rhinnorhea. Mouth/Throat: Mucous membranes are moist.  Oropharynx non-erythematous. Cardiovascular: Normal rate, regular rhythm. Grossly normal heart sounds.  Good peripheral circulation. Respiratory: Normal respiratory effort.  No retractions. Lungs CTAB. Gastrointestinal: Soft and nontender. No distention. Positive bowel sounds Genitourinary: Deferred Musculoskeletal: No lower extremity tenderness nor edema.  No joint effusions. Neurologic:  Normal speech and language. No gross focal neurologic deficits are appreciated.  Skin:  Skin is warm, dry and intact. No rash noted. Psychiatric: Mood and affect are normal.   ____________________________________________   LABS (all labs ordered are listed, but only abnormal results are displayed)  Labs Reviewed  COMPREHENSIVE METABOLIC PANEL - Abnormal; Notable for the following:    BUN 39 (*)    Creatinine, Ser 2.42 (*)    ALT 13 (*)    GFR calc non Af Amer 27 (*)    GFR calc Af Amer 32 (*)    All other components within normal limits  ACETAMINOPHEN LEVEL - Abnormal; Notable for the following:    Acetaminophen (Tylenol), Serum <10 (*)    All other components within normal limits  CBC - Abnormal; Notable for  the following:    WBC 14.9 (*)    RBC 4.02 (*)    Hemoglobin 12.1 (*)    HCT 36.6 (*)    RDW 14.9 (*)    Platelets 119 (*)    All other components within normal limits  URINE DRUG SCREEN, QUALITATIVE ([facility] ONLY) - Abnormal; Notable for the following:    Cannabinoid 50 Ng, Ur [place] POSITIVE (*)    All other components within normal limits  ETHANOL  SALICYLATE LEVEL  URINALYSIS COMPLETEWITH MICROSCOPIC ([facility] ONLY)   ____________________________________________  EKG  None ____________________________________________  RADIOLOGY  None ____________________________________________  PROCEDURES  Procedure(s) performed: None  Critical Care performed: No  ____________________________________________   INITIAL IMPRESSION / ASSESSMENT AND PLAN / ED COURSE  Pertinent labs & imaging results that were available during my care of the patient were reviewed by me and considered in my medical decision making (see chart for details).  This is 58 year old male who comes in today feeling paranoid and that people are coming after him. I will have the patient evaluated by the behavioral intake nurse and have him evaluated by psych. We will check the patient's urine for infection. ____________________________________________   FINAL CLINICAL IMPRESSION(S) / ED DIAGNOSES  Final diagnoses:  None      Loney Hering, MD 12/24/14 [phone]

## 2014-12-24 NOTE — ED Notes (Signed)
Pt has urinary catheter in place upon arrival.
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
## 2014-12-24 NOTE — ED Provider Notes (Signed)
Patient seen by psych and basically recants his history that he gave. Apparently he left the group home without telling anybody wanted to see someone because of his burning his urinary system because he thinks his UTI. We are currently waiting for the fresh urine specimen. We'll treat him with Cipro. He can follow-up with his regular doctors. He is not homicidal or suicidal or anxious at this time.  Nena Polio, MD 12/24/14 [phone]
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
## 2014-12-24 NOTE — ED Notes (Signed)
Pt escorted with nurse to the waiting room to await his ride
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
## 2014-12-24 NOTE — ED Notes (Signed)
BEHAVIORAL HEALTH ROUNDING Patient sleeping: No. Patient alert and oriented: yes Behavior appropriate: Yes.  ; If no, describe:  Nutrition and fluids offered: Yes  Toileting and hygiene offered: Yes  Sitter present: yes Law enforcement present: Yes  

## 2014-12-24 NOTE — ED Notes (Signed)
Pt to be discharged per edp - cipro ordered from pharmacy.

## 2014-12-24 NOTE — ED Notes (Signed)

## 2014-12-24 NOTE — ED Notes (Signed)
BEHAVIORAL HEALTH ROUNDING Patient sleeping: No. Patient alert and oriented: yes Behavior appropriate: Yes.  ; If no, describe:  Nutrition and fluids offered: No Toileting and hygiene offered: Yes  Sitter present: yes Law enforcement present: Yes

## 2014-12-24 NOTE — ED Notes (Signed)
Pt sleeping at this time, cont to monitor. Jari Sportsman from [facility] called for an update at [phone]

## 2014-12-24 NOTE — ED Notes (Signed)
Pt eating lunch no distress noted

## 2014-12-25 LAB — URINE CULTURE

## 2015-01-17 ENCOUNTER — Emergency Department: Payer: Medicare Other

## 2015-01-17 ENCOUNTER — Encounter: Payer: Self-pay | Admitting: *Deleted

## 2015-01-17 ENCOUNTER — Inpatient Hospital Stay: Payer: Medicare Other

## 2015-01-17 ENCOUNTER — Inpatient Hospital Stay
Admission: EM | Admit: 2015-01-17 | Discharge: 2015-01-22 | DRG: 698 | Payer: Medicare Other | Attending: Internal Medicine | Admitting: Internal Medicine

## 2015-01-17 DIAGNOSIS — G9340 Encephalopathy, unspecified: Secondary | ICD-10-CM | POA: Diagnosis present

## 2015-01-17 DIAGNOSIS — J45909 Unspecified asthma, uncomplicated: Secondary | ICD-10-CM | POA: Diagnosis present

## 2015-01-17 DIAGNOSIS — I252 Old myocardial infarction: Secondary | ICD-10-CM

## 2015-01-17 DIAGNOSIS — N179 Acute kidney failure, unspecified: Secondary | ICD-10-CM | POA: Diagnosis present

## 2015-01-17 DIAGNOSIS — Z79899 Other long term (current) drug therapy: Secondary | ICD-10-CM

## 2015-01-17 DIAGNOSIS — Z801 Family history of malignant neoplasm of trachea, bronchus and lung: Secondary | ICD-10-CM

## 2015-01-17 DIAGNOSIS — Z87442 Personal history of urinary calculi: Secondary | ICD-10-CM

## 2015-01-17 DIAGNOSIS — N183 Chronic kidney disease, stage 3 (moderate): Secondary | ICD-10-CM | POA: Diagnosis present

## 2015-01-17 DIAGNOSIS — W19XXXA Unspecified fall, initial encounter: Secondary | ICD-10-CM

## 2015-01-17 DIAGNOSIS — T8351XA Infection and inflammatory reaction due to indwelling urinary catheter, initial encounter: Secondary | ICD-10-CM | POA: Diagnosis present

## 2015-01-17 DIAGNOSIS — Z823 Family history of stroke: Secondary | ICD-10-CM

## 2015-01-17 DIAGNOSIS — R6520 Severe sepsis without septic shock: Secondary | ICD-10-CM | POA: Diagnosis present

## 2015-01-17 DIAGNOSIS — Z8673 Personal history of transient ischemic attack (TIA), and cerebral infarction without residual deficits: Secondary | ICD-10-CM

## 2015-01-17 DIAGNOSIS — Z87440 Personal history of urinary (tract) infections: Secondary | ICD-10-CM | POA: Diagnosis not present

## 2015-01-17 DIAGNOSIS — Z8711 Personal history of peptic ulcer disease: Secondary | ICD-10-CM | POA: Diagnosis not present

## 2015-01-17 DIAGNOSIS — E039 Hypothyroidism, unspecified: Secondary | ICD-10-CM | POA: Diagnosis present

## 2015-01-17 DIAGNOSIS — R531 Weakness: Secondary | ICD-10-CM | POA: Diagnosis present

## 2015-01-17 DIAGNOSIS — R339 Retention of urine, unspecified: Secondary | ICD-10-CM

## 2015-01-17 DIAGNOSIS — R0602 Shortness of breath: Secondary | ICD-10-CM

## 2015-01-17 DIAGNOSIS — F209 Schizophrenia, unspecified: Secondary | ICD-10-CM | POA: Diagnosis present

## 2015-01-17 DIAGNOSIS — N319 Neuromuscular dysfunction of bladder, unspecified: Secondary | ICD-10-CM | POA: Diagnosis present

## 2015-01-17 DIAGNOSIS — Z905 Acquired absence of kidney: Secondary | ICD-10-CM | POA: Diagnosis present

## 2015-01-17 DIAGNOSIS — Y846 Urinary catheterization as the cause of abnormal reaction of the patient, or of later complication, without mention of misadventure at the time of the procedure: Secondary | ICD-10-CM | POA: Diagnosis present

## 2015-01-17 DIAGNOSIS — T68XXXA Hypothermia, initial encounter: Secondary | ICD-10-CM

## 2015-01-17 DIAGNOSIS — N390 Urinary tract infection, site not specified: Secondary | ICD-10-CM | POA: Diagnosis present

## 2015-01-17 DIAGNOSIS — D72829 Elevated white blood cell count, unspecified: Secondary | ICD-10-CM

## 2015-01-17 DIAGNOSIS — N136 Pyonephrosis: Secondary | ICD-10-CM | POA: Diagnosis present

## 2015-01-17 DIAGNOSIS — F17210 Nicotine dependence, cigarettes, uncomplicated: Secondary | ICD-10-CM | POA: Diagnosis present

## 2015-01-17 DIAGNOSIS — R680 Hypothermia, not associated with low environmental temperature: Secondary | ICD-10-CM | POA: Diagnosis present

## 2015-01-17 DIAGNOSIS — J029 Acute pharyngitis, unspecified: Secondary | ICD-10-CM | POA: Diagnosis present

## 2015-01-17 DIAGNOSIS — J449 Chronic obstructive pulmonary disease, unspecified: Secondary | ICD-10-CM | POA: Diagnosis present

## 2015-01-17 DIAGNOSIS — E162 Hypoglycemia, unspecified: Secondary | ICD-10-CM | POA: Diagnosis present

## 2015-01-17 DIAGNOSIS — E11649 Type 2 diabetes mellitus with hypoglycemia without coma: Secondary | ICD-10-CM | POA: Diagnosis present

## 2015-01-17 DIAGNOSIS — G9341 Metabolic encephalopathy: Secondary | ICD-10-CM | POA: Diagnosis present

## 2015-01-17 DIAGNOSIS — E871 Hypo-osmolality and hyponatremia: Secondary | ICD-10-CM | POA: Diagnosis present

## 2015-01-17 DIAGNOSIS — A419 Sepsis, unspecified organism: Secondary | ICD-10-CM | POA: Diagnosis present

## 2015-01-17 DIAGNOSIS — E1122 Type 2 diabetes mellitus with diabetic chronic kidney disease: Secondary | ICD-10-CM | POA: Diagnosis present

## 2015-01-17 LAB — COMPREHENSIVE METABOLIC PANEL
ALK PHOS: 87 U/L (ref 38–126)
ALT: 13 U/L — ABNORMAL LOW (ref 17–63)
ANION GAP: 8 (ref 5–15)
AST: 34 U/L (ref 15–41)
Albumin: 3.4 g/dL — ABNORMAL LOW (ref 3.5–5.0)
BILIRUBIN TOTAL: 0.4 mg/dL (ref 0.3–1.2)
BUN: 29 mg/dL — ABNORMAL HIGH (ref 6–20)
CO2: 23 mmol/L (ref 22–32)
Calcium: 8.5 mg/dL — ABNORMAL LOW (ref 8.9–10.3)
Chloride: 96 mmol/L — ABNORMAL LOW (ref 101–111)
Creatinine, Ser: 1.7 mg/dL — ABNORMAL HIGH (ref 0.61–1.24)
GFR calc non Af Amer: 42 mL/min — ABNORMAL LOW (ref >60)
GFR, EST AFRICAN AMERICAN: 49 mL/min — AB (ref >60)
Glucose, Bld: 63 mg/dL — ABNORMAL LOW (ref 65–99)
Potassium: 4.5 mmol/L (ref 3.5–5.1)
SODIUM: 127 mmol/L — AB (ref 135–145)
TOTAL PROTEIN: 6.8 g/dL (ref 6.5–8.1)

## 2015-01-17 LAB — URINALYSIS COMPLETE WITH MICROSCOPIC (ARMC ONLY)
BILIRUBIN URINE: NEGATIVE
Glucose, UA: NEGATIVE mg/dL
KETONES UR: NEGATIVE mg/dL
Nitrite: POSITIVE — AB
PROTEIN: NEGATIVE mg/dL
Specific Gravity, Urine: 1.003 — ABNORMAL LOW (ref 1.005–1.030)
pH: 7 (ref 5.0–8.0)

## 2015-01-17 LAB — GLUCOSE, CAPILLARY
GLUCOSE-CAPILLARY: 103 mg/dL — AB (ref 65–99)
GLUCOSE-CAPILLARY: 126 mg/dL — AB (ref 65–99)
GLUCOSE-CAPILLARY: 138 mg/dL — AB (ref 65–99)
GLUCOSE-CAPILLARY: 37 mg/dL — AB (ref 65–99)
GLUCOSE-CAPILLARY: 37 mg/dL — AB (ref 65–99)
Glucose-Capillary: 64 mg/dL — ABNORMAL LOW (ref 65–99)
Glucose-Capillary: 99 mg/dL (ref 65–99)
Glucose-Capillary: 71 mg/dL (ref 65–99)
Glucose-Capillary: 48 mg/dL — ABNORMAL LOW (ref 65–99)
Glucose-Capillary: 131 mg/dL — ABNORMAL HIGH (ref 65–99)

## 2015-01-17 LAB — CBC WITH DIFFERENTIAL/PLATELET
BASOS PCT: 0 %
Basophils Absolute: 0 10*3/uL (ref 0–0.1)
EOS ABS: 0 10*3/uL (ref 0–0.7)
Eosinophils Relative: 0 %
HEMATOCRIT: 39.3 % — AB (ref 40.0–52.0)
HEMOGLOBIN: 12.9 g/dL — AB (ref 13.0–18.0)
Lymphocytes Relative: 5 %
Lymphs Abs: 1.1 10*3/uL (ref 1.0–3.6)
MCH: 30 pg (ref 26.0–34.0)
MCHC: 33 g/dL (ref 32.0–36.0)
MCV: 91 fL (ref 80.0–100.0)
Monocytes Absolute: 0.7 10*3/uL (ref 0.2–1.0)
Monocytes Relative: 3 %
NEUTROS ABS: 20.2 10*3/uL — AB (ref 1.4–6.5)
Neutrophils Relative %: 92 %
Platelets: 154 10*3/uL (ref 150–440)
RBC: 4.31 MIL/uL — ABNORMAL LOW (ref 4.40–5.90)
RDW: 15.3 % — ABNORMAL HIGH (ref 11.5–14.5)
WBC: 22 10*3/uL — ABNORMAL HIGH (ref 3.8–10.6)

## 2015-01-17 LAB — TSH: TSH: 3.479 u[IU]/mL (ref 0.350–4.500)

## 2015-01-17 LAB — T4, FREE: FREE T4: 0.96 ng/dL (ref 0.61–1.12)

## 2015-01-17 LAB — LACTIC ACID, PLASMA
Lactic Acid, Venous: 2.4 mmol/L (ref 0.5–2.0)
Lactic Acid, Venous: 2 mmol/L (ref 0.5–2.0)

## 2015-01-17 LAB — CK: Total CK: 660 U/L — ABNORMAL HIGH (ref 49–397)

## 2015-01-17 LAB — TROPONIN I

## 2015-01-17 MED ORDER — VANCOMYCIN HCL IN DEXTROSE 1-5 GM/200ML-% IV SOLN
1000.0000 mg | Freq: Once | INTRAVENOUS | Status: AC
  Administered 2015-01-17: 1000 mg via INTRAVENOUS
  Filled 2015-01-17: qty 200

## 2015-01-17 MED ORDER — IPRATROPIUM-ALBUTEROL 20-100 MCG/ACT IN AERS
1.0000 | INHALATION_SPRAY | Freq: Four times a day (QID) | RESPIRATORY_TRACT | Status: DC | PRN
  Filled 2015-01-17: qty 4

## 2015-01-17 MED ORDER — DARIFENACIN HYDROBROMIDE ER 7.5 MG PO TB24
7.5000 mg | ORAL_TABLET | Freq: Every day | ORAL | Status: DC
  Administered 2015-01-18 – 2015-01-22 (×5): 7.5 mg via ORAL
  Filled 2015-01-17 (×5): qty 1

## 2015-01-17 MED ORDER — ENOXAPARIN SODIUM 30 MG/0.3ML ~~LOC~~ SOLN
30.0000 mg | SUBCUTANEOUS | Status: DC
  Administered 2015-01-17: 20:00:00 30 mg via SUBCUTANEOUS
  Filled 2015-01-17: qty 0.3

## 2015-01-17 MED ORDER — TAMSULOSIN HCL 0.4 MG PO CAPS
0.4000 mg | ORAL_CAPSULE | Freq: Every day | ORAL | Status: DC
  Administered 2015-01-17 – 2015-01-22 (×6): 0.4 mg via ORAL
  Filled 2015-01-17 (×8): qty 1

## 2015-01-17 MED ORDER — HYDROMORPHONE HCL 1 MG/ML IJ SOLN: 2.0000 mg | Freq: Once | INTRAMUSCULAR | Status: DC

## 2015-01-17 MED ORDER — DIPHENHYDRAMINE HCL 25 MG PO CAPS
50.0000 mg | ORAL_CAPSULE | Freq: Every day | ORAL | Status: DC
  Administered 2015-01-17 – 2015-01-21 (×5): 50 mg via ORAL
  Filled 2015-01-17 (×5): qty 2

## 2015-01-17 MED ORDER — HYDROCODONE-ACETAMINOPHEN 5-325 MG PO TABS
1.0000 | ORAL_TABLET | Freq: Once | ORAL | Status: AC
  Administered 2015-01-18: 1 via ORAL
  Filled 2015-01-17: qty 1

## 2015-01-17 MED ORDER — ACETAMINOPHEN 325 MG PO TABS
650.0000 mg | ORAL_TABLET | Freq: Four times a day (QID) | ORAL | Status: DC | PRN
  Administered 2015-01-17 – 2015-01-22 (×8): 650 mg via ORAL
  Filled 2015-01-17 (×8): qty 2

## 2015-01-17 MED ORDER — MENTHOL 3 MG MT LOZG
1.0000 | LOZENGE | OROMUCOSAL | Status: DC | PRN
  Filled 2015-01-17: qty 9

## 2015-01-17 MED ORDER — LEVOTHYROXINE SODIUM 50 MCG PO TABS
50.0000 ug | ORAL_TABLET | Freq: Every day | ORAL | Status: DC
  Administered 2015-01-18 – 2015-01-22 (×5): 50 ug via ORAL
  Filled 2015-01-17 (×5): qty 1

## 2015-01-17 MED ORDER — ALBUTEROL SULFATE (2.5 MG/3ML) 0.083% IN NEBU: 2.5000 mg | INHALATION_SOLUTION | RESPIRATORY_TRACT | Status: DC | PRN

## 2015-01-17 MED ORDER — ONDANSETRON HCL 4 MG PO TABS: 4.0000 mg | ORAL_TABLET | Freq: Four times a day (QID) | ORAL | Status: DC | PRN

## 2015-01-17 MED ORDER — HYDROCORTISONE ACETATE 25 MG RE SUPP
25.0000 mg | Freq: Three times a day (TID) | RECTAL | Status: DC | PRN
  Filled 2015-01-17: qty 1

## 2015-01-17 MED ORDER — TRAMADOL HCL 50 MG PO TABS
50.0000 mg | ORAL_TABLET | Freq: Four times a day (QID) | ORAL | Status: DC | PRN
  Administered 2015-01-17 – 2015-01-20 (×10): 50 mg via ORAL
  Filled 2015-01-17 (×10): qty 1

## 2015-01-17 MED ORDER — DEXTROSE 10 % IV SOLN
INTRAVENOUS | Status: DC
  Administered 2015-01-17: 10:00:00 via INTRAVENOUS

## 2015-01-17 MED ORDER — DEXTROSE 50 % IV SOLN
INTRAVENOUS | Status: AC
  Administered 2015-01-17: 50 mL via INTRAVENOUS
  Filled 2015-01-17: qty 100

## 2015-01-17 MED ORDER — BISACODYL 10 MG RE SUPP: 10.0000 mg | Freq: Every day | RECTAL | Status: DC | PRN

## 2015-01-17 MED ORDER — LIDOCAINE HCL 2 % EX GEL
1.0000 "application " | Freq: Once | CUTANEOUS | Status: DC
  Filled 2015-01-17: qty 5

## 2015-01-17 MED ORDER — IPRATROPIUM-ALBUTEROL 0.5-2.5 (3) MG/3ML IN SOLN: 3.0000 mL | Freq: Four times a day (QID) | RESPIRATORY_TRACT | Status: DC | PRN

## 2015-01-17 MED ORDER — ACETAMINOPHEN 650 MG RE SUPP: 650.0000 mg | Freq: Four times a day (QID) | RECTAL | Status: DC | PRN

## 2015-01-17 MED ORDER — SODIUM CHLORIDE 0.9 % IV BOLUS (SEPSIS)
2520.0000 mL | Freq: Once | INTRAVENOUS | Status: AC
  Administered 2015-01-17: 1000 mL via INTRAVENOUS

## 2015-01-17 MED ORDER — MORPHINE SULFATE (PF) 4 MG/ML IV SOLN: 4.0000 mg | Freq: Once | INTRAVENOUS | Status: DC | PRN

## 2015-01-17 MED ORDER — RISPERIDONE 1 MG PO TABS
0.5000 mg | ORAL_TABLET | Freq: Every day | ORAL | Status: DC
  Administered 2015-01-17 – 2015-01-21 (×5): 0.5 mg via ORAL
  Filled 2015-01-17: qty 2
  Filled 2015-01-17 (×2): qty 1
  Filled 2015-01-17: qty 2
  Filled 2015-01-17: qty 1

## 2015-01-17 MED ORDER — MOMETASONE FURO-FORMOTEROL FUM 100-5 MCG/ACT IN AERO
2.0000 | INHALATION_SPRAY | Freq: Two times a day (BID) | RESPIRATORY_TRACT | Status: DC
  Administered 2015-01-17 – 2015-01-22 (×10): 2 via RESPIRATORY_TRACT
  Filled 2015-01-17: qty 8.8

## 2015-01-17 MED ORDER — SODIUM CHLORIDE 0.9 % IJ SOLN
3.0000 mL | Freq: Two times a day (BID) | INTRAMUSCULAR | Status: DC
  Administered 2015-01-17 – 2015-01-22 (×9): 3 mL via INTRAVENOUS

## 2015-01-17 MED ORDER — DEXTROSE 5 % IV SOLN
1.0000 g | INTRAVENOUS | Status: DC
  Administered 2015-01-17 – 2015-01-22 (×6): 1 g via INTRAVENOUS
  Filled 2015-01-17 (×8): qty 10

## 2015-01-17 MED ORDER — PIPERACILLIN-TAZOBACTAM 3.375 G IVPB
3.3750 g | Freq: Once | INTRAVENOUS | Status: AC
  Administered 2015-01-17: 3.375 g via INTRAVENOUS
  Filled 2015-01-17: qty 50

## 2015-01-17 MED ORDER — DOCUSATE SODIUM 100 MG PO CAPS
100.0000 mg | ORAL_CAPSULE | Freq: Two times a day (BID) | ORAL | Status: DC
  Administered 2015-01-17 – 2015-01-22 (×7): 100 mg via ORAL
  Filled 2015-01-17 (×8): qty 1

## 2015-01-17 MED ORDER — ONDANSETRON HCL 4 MG/2ML IJ SOLN: 4.0000 mg | Freq: Four times a day (QID) | INTRAMUSCULAR | Status: DC | PRN

## 2015-01-17 MED ORDER — FLUOXETINE HCL 20 MG PO CAPS
40.0000 mg | ORAL_CAPSULE | Freq: Every day | ORAL | Status: DC
  Administered 2015-01-18 – 2015-01-22 (×5): 40 mg via ORAL
  Filled 2015-01-17 (×5): qty 2

## 2015-01-17 MED ORDER — IPRATROPIUM-ALBUTEROL 18-103 MCG/ACT IN AERO: 2.0000 | INHALATION_SPRAY | Freq: Four times a day (QID) | RESPIRATORY_TRACT | Status: DC | PRN

## 2015-01-17 MED ORDER — PIPERACILLIN SOD-TAZOBACTAM SO 3.375 (3-0.375) G IV SOLR
INTRAVENOUS | Status: AC
  Filled 2015-01-17: qty 3.38

## 2015-01-17 MED ORDER — DEXTROSE 50 % IV SOLN
1.0000 | Freq: Once | INTRAVENOUS | Status: AC
  Administered 2015-01-17: 50 mL via INTRAVENOUS

## 2015-01-17 MED ORDER — DEXTROSE-NACL 5-0.9 % IV SOLN
INTRAVENOUS | Status: DC
  Administered 2015-01-17 – 2015-01-19 (×3): via INTRAVENOUS

## 2015-01-17 MED ORDER — SODIUM BICARBONATE 650 MG PO TABS
650.0000 mg | ORAL_TABLET | Freq: Two times a day (BID) | ORAL | Status: DC
  Administered 2015-01-17 – 2015-01-22 (×10): 650 mg via ORAL
  Filled 2015-01-17 (×12): qty 1

## 2015-01-17 NOTE — ED Notes (Signed)
Patient transported to CT 

## 2015-01-17 NOTE — ED Notes (Signed)
Bear hugger discontinued per Dr. Edd Fabian
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
## 2015-01-17 NOTE — H&P (Signed)
[facility] at [facility] NAME: Alan Small    MR#:  [id number]  DATE OF BIRTH:  1956/02/05  DATE OF ADMISSION:  01/17/2015  PRIMARY CARE PHYSICIAN: Alan Median, MD   REQUESTING/REFERRING PHYSICIAN: Dr. Edd Fabian  CHIEF COMPLAINT:   Chief Complaint  Patient presents with  . Hypoglycemia    HISTORY OF PRESENT ILLNESS:  Alan Small  is a 59 y.o. male with a known history of CKD stage III, nephrolithiasis with multiple surgeries and chronic Foley catheter presents from a group home due to altered mental status. EMS found his blood glucose level 11. Received D50. He was found to have a UTI along with leukocytosis tachycardia. Hypothermia improved with warm fluids and Bair hugger. Presently he is off the [facility]. But is needing dextrose to keep his blood sugars in the normal range. Is being admitted for severe sepsis due to UTI related to catheter. Patient is awake and alert at this time. Complains of fatigue some headache cough and sore throat. No other concerns. No abdominal pain, shortness of breath, vomiting, diarrhea.  PAST MEDICAL HISTORY:   Past Medical History  Diagnosis Date  . Mental disorder   . Hypothyroidism   . Seizures   . Myocardial infarction   . Stroke   . Asthma     PAST SURGICAL HISTORY:   Past Surgical History  Procedure Laterality Date  . Appendectomy    . Colonoscopy with propofol N/A 12/07/2014    Procedure: COLONOSCOPY WITH PROPOFOL;  Surgeon: Manya Silvas, MD;  Location: [facility] ENDOSCOPY;  Service: Endoscopy;  Laterality: N/A;  . Esophagogastroduodenoscopy N/A 12/07/2014    Procedure: ESOPHAGOGASTRODUODENOSCOPY (EGD);  Surgeon: Manya Silvas, MD;  Location: [facility] ENDOSCOPY;  Service: Endoscopy;  Laterality: N/A;  . Nephrectomy      SOCIAL HISTORY:   Social History  Substance Use Topics  . Smoking status: Current Every Day Smoker -- 1.00 packs/day for 55 years    Types: Cigarettes  .  Smokeless tobacco: Former Systems developer    Types: Chew  . Alcohol Use: No    FAMILY HISTORY:   Family History  Problem Relation Age of Onset  . Stroke Father     DRUG ALLERGIES:   Allergies  Allergen Reactions  . Mellaril [Thioridazine] Other (See Comments)    Unknown   . Morphine And Related Itching  . Navane [Thiothixene] Other (See Comments)    GI Distress  . Thorazine [Chlorpromazine] Other (See Comments)    Unknown    REVIEW OF SYSTEMS:   Review of Systems  Constitutional: Positive for malaise/fatigue. Negative for fever, chills and weight loss.  HENT: Positive for sore throat. Negative for hearing loss and nosebleeds.   Eyes: Negative for blurred vision, double vision and pain.  Respiratory: Positive for cough. Negative for hemoptysis, sputum production, shortness of breath and wheezing.   Cardiovascular: Negative for chest pain, palpitations, orthopnea and leg swelling.  Gastrointestinal: Positive for nausea. Negative for vomiting, abdominal pain, diarrhea and constipation.  Genitourinary: Negative for dysuria and hematuria.  Musculoskeletal: Negative for myalgias, back pain and falls.  Skin: Negative for rash.  Neurological: Positive for dizziness and weakness. Negative for tremors, sensory change, speech change, focal weakness, seizures and headaches.  Endo/Heme/Allergies: Does not bruise/bleed easily.  Psychiatric/Behavioral: Positive for depression and memory loss. The patient is not nervous/anxious.     MEDICATIONS AT HOME:   Prior to Admission medications   Medication Sig Start Date End Date Taking? Authorizing  Provider  albuterol-ipratropium (COMBIVENT) 18-103 MCG/ACT inhaler Inhale 2 puffs into the lungs every 6 (six) hours as needed for wheezing.   Yes Historical Provider, MD  diphenhydrAMINE (BENADRYL) 50 MG capsule Take 50 mg by mouth at bedtime.   Yes Historical Provider, MD  FLUoxetine (PROZAC) 40 MG capsule Take 40 mg by mouth daily.   Yes Historical  Provider, MD  Fluticasone-Salmeterol (ADVAIR) 250-50 MCG/DOSE AEPB Inhale 1 puff into the lungs 2 (two) times daily.   Yes Historical Provider, MD  hydrocortisone (ANUSOL-HC) 25 MG suppository Place 25 mg rectally 3 (three) times daily as needed for hemorrhoids or itching.   Yes Historical Provider, MD  levothyroxine (SYNTHROID, LEVOTHROID) 50 MCG tablet Take 50 mcg by mouth daily before breakfast.   Yes Historical Provider, MD  risperiDONE (RISPERDAL) 0.5 MG tablet Take 0.5 mg by mouth at bedtime.   Yes Historical Provider, MD  sodium bicarbonate 650 MG tablet Take 2 tablets (1,300 mg total) by mouth 2 (two) times daily. Patient taking differently: Take 650 mg by mouth 2 (two) times daily.  11/11/12  Yes Nita Sells, MD  solifenacin (VESICARE) 5 MG tablet Take 5 mg by mouth daily.   Yes Historical Provider, MD  tamsulosin (FLOMAX) 0.4 MG CAPS capsule Take 0.4 mg by mouth daily.   Yes Historical Provider, MD      VITAL SIGNS:  Blood pressure 131/90, pulse 79, temperature 97.8 F (36.6 C), temperature source Oral, resp. rate 17, height 6' (1.829 m), weight 65.772 kg (145 lb), SpO2 100 %.  PHYSICAL EXAMINATION:  Physical Exam  GENERAL:  59 y.o.-year-old patient lying in the bed with no acute distress.  EYES: Pupils equal, round, reactive to light and accommodation. No scleral icterus. Extraocular muscles intact.  HEENT: Head atraumatic, normocephalic. Oropharynx and nasopharynx clear. No oropharyngeal erythema, dry oral mucosa  NECK:  Supple, no jugular venous distention. No thyroid enlargement, no tenderness.  LUNGS: Normal breath sounds bilaterally, no wheezing, rales, rhonchi. No use of accessory muscles of respiration.  CARDIOVASCULAR: S1, S2 normal. No murmurs, rubs, or gallops.  ABDOMEN: Soft, nontender, nondistended. Bowel sounds present. No organomegaly or mass. Foley catheter in place with clear urine EXTREMITIES: No pedal edema, cyanosis, or clubbing. + 2 pedal & radial  pulses b/l.   NEUROLOGIC: Cranial nerves II through XII are intact. No focal Motor or sensory deficits appreciated b/l PSYCHIATRIC: The patient is alert and oriented x 3. Good affect.  SKIN: No obvious rash, lesion, or ulcer.   LABORATORY PANEL:   CBC  Recent Labs Lab 01/17/15 0947  WBC 22.0*  HGB 12.9*  HCT 39.3*  PLT 154   ------------------------------------------------------------------------------------------------------------------  Chemistries   Recent Labs Lab 01/17/15 0947  NA 127*  K 4.5  CL 96*  CO2 23  GLUCOSE 63*  BUN 29*  CREATININE 1.70*  CALCIUM 8.5*  AST 34  ALT 13*  ALKPHOS 87  BILITOT 0.4   ------------------------------------------------------------------------------------------------------------------  Cardiac Enzymes  Recent Labs Lab 01/17/15 0947  TROPONINI <0.03   ------------------------------------------------------------------------------------------------------------------  RADIOLOGY:  Ct Head Wo Contrast  01/17/2015   CLINICAL DATA:  Fall  EXAM: CT HEAD WITHOUT CONTRAST  CT CERVICAL SPINE WITHOUT CONTRAST  TECHNIQUE: Multidetector CT imaging of the head and cervical spine was performed following the standard protocol without intravenous contrast. Multiplanar CT image reconstructions of the cervical spine were also generated.  COMPARISON:  11/02/2012  FINDINGS: CT HEAD FINDINGS  No skull fracture is noted. No intracranial hemorrhage, mass effect or midline shift. No acute cortical  infarction. Paranasal sinuses and mastoid air cells are unremarkable. No hydrocephalus. The gray and white-matter differentiation is preserved. No mass lesion is noted on this unenhanced scan. Ventricular size is stable from prior exam.  CT CERVICAL SPINE FINDINGS  Axial images of the cervical spine shows no acute fracture or subluxation. Computer processed images shows no acute fracture or subluxation. Degenerative changes are noted C1-C2 articulation. There is  disc space flattening with endplate sclerotic changes mild anterior and mild posterior spurring at C3-C4 and C5-C6 level. Disc space flattening with mild anterior spurring at C6-C7 level. No prevertebral soft tissue swelling. Cervical airway is patent. Mild posterior spurring at C6-C7 level.  There is no pneumothorax in visualized lung apices. 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 Multilevel facet degenerative changes are noted.  IMPRESSION: 1. No acute intracranial abnormality.  No significant change. 2. No cervical spine acute fracture or subluxation. Degenerative changes as described above.   Electronically Signed   By: Lahoma Crocker M.D.   On: 01/17/2015 11:41   Ct Cervical Spine Wo Contrast  01/17/2015   CLINICAL DATA:  Fall  EXAM: CT HEAD WITHOUT CONTRAST  CT CERVICAL SPINE WITHOUT CONTRAST  TECHNIQUE: Multidetector CT imaging of the head and cervical spine was performed following the standard protocol without intravenous contrast. Multiplanar CT image reconstructions of the cervical spine were also generated.  COMPARISON:  11/02/2012  FINDINGS: CT HEAD FINDINGS  No skull fracture is noted. No intracranial hemorrhage, mass effect or midline shift. No acute cortical infarction. Paranasal sinuses and mastoid air cells are unremarkable. No hydrocephalus. The gray and white-matter differentiation is preserved. No mass lesion is noted on this unenhanced scan. Ventricular size is stable from prior exam.  CT CERVICAL SPINE FINDINGS  Axial images of the cervical spine shows no acute fracture or subluxation. Computer processed images shows no acute fracture or subluxation. Degenerative changes are noted C1-C2 articulation. There is disc space flattening with endplate sclerotic changes mild anterior and mild posterior spurring at C3-C4 and C5-C6 level. Disc space flattening with mild anterior spurring at C6-C7 level. No prevertebral soft tissue swelling. Cervical airway is patent.  Mild posterior spurring at C6-C7 level.  There is no pneumothorax in visualized lung apices. 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 Multilevel facet degenerative changes are noted.  IMPRESSION: 1. No acute intracranial abnormality.  No significant change. 2. No cervical spine acute fracture or subluxation. Degenerative changes as described above.   Electronically Signed   By: Lahoma Crocker M.D.   On: 01/17/2015 11:41   Dg Chest Port 1 View  01/17/2015   CLINICAL DATA:  Increase shortness of breath today post fall. Generalized weakness, sepsis.  EXAM: PORTABLE CHEST - 1 VIEW  COMPARISON:  12/05/2013  FINDINGS: The heart size and mediastinal contours are within normal limits. Both lungs are clear. The visualized skeletal structures are unremarkable.  IMPRESSION: No active disease.   Electronically Signed   By: Rolm Baptise M.D.   On: 01/17/2015 10:24     IMPRESSION AND PLAN:   * UTI due to chronic indwelling Foley catheter with severe sepsis Start patient on ceftriaxone. His last urine culture available had Proteus. Patient did have complicated UTIs in the past causing bacteremia. Blood cultures have been sent. IV fluid resuscitation for severe sepsis. Will need to change Foley catheter. Urology has been consult did from the emergency room for this as patient has difficult Foley replacement.  * Hypoglycemia and hypothermia  due to severe sepsis. Hypothermia is improving. Start patient on D10 for his hypoglycemia. Accu-Cheks every 2 hours.  * Acute encephalopathy due to hypoglycemia has resolved.  * Acute renal failure over CKD stage III Due to severe sepsis. IV fluid resuscitation. Monitor input and output. Repeat labs in the morning.  * Hypovolemic hyponatremia - patient is on IV fluids. We will repeat in the morning.  * Schizophrenia Continue home medications  * Sore throat On abx for UTI Lozenges  * DVT prophylaxis with Lovenox  All the records are  reviewed and case discussed with ED provider. Management plans discussed with the patient, family and they are in agreement.  CODE STATUS: FULL CODE  TOTAL TIME TAKING CARE OF THIS PATIENT: 40 minutes.    Hillary Bow R M.D on 01/17/2015 at 12:11 PM  Between 7am to 6pm - Pager - [phone]  After 6pm go to [email or web address] - password [email or web address] [facility]  Office  [phone]  CC: Primary care physician; Alan Median, MD

## 2015-01-17 NOTE — ED Notes (Signed)
Pt arrives with foley in place, attempted to remove old foley, 1200cc removed from old foley, met [facility], Dr. Edd Fabian at bedside, foley left in place

## 2015-01-17 NOTE — Consult Note (Signed)
Urology Consult  Subjective:  [name] is a 59yo male with a history of multiple chronic health issues including CKD stage III, MI, seizures, stroke and nephrolithiasis.  He has a chronic indwelling Foley catheter and initially presented to the ED after being found unresponsive at his group home.  EMS found his blood glucose to be 11 and he was given D50.   Labs drawn in ED remarkable for elevated WBC of 22 and Creatinine of 1.70. Urine positive for +3 leuks, +nitrites, RBC +2 and many bacteria. Patient is now alert and appears comfortable.  He received 1 dose of Rocephin in ED.   Replacement of Foley catheter by ED staff was unsuccessful.  CT pelvis showed significant calcification at tip of catheter balloon measuring 6 mm and Urology was consulted to assist with removal. Per patient he is supposed have catheter changed monthly by group home staff but he is a poor historian and cannot recall when catheter was last placed.  It is unclear when current catheter was initially inserted and may have been in place for some time.  He is a patient of Dr. Jacqlyn Larsen and Dr. Aleene Davidson with [facility] for management of significant nephrolithiasis but patient states that it has been sometime since he has seen him. 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    Anti-infectives: Anti-infectives    Start     Dose/Rate Route Frequency Ordered Stop   01/17/15 1215  cefTRIAXone (ROCEPHIN) 1 g in dextrose 5 % 50 mL IVPB     1 g 100 mL/hr over 30 Minutes Intravenous Every 24 hours 01/17/15 1207     01/17/15 1024  piperacillin-tazobactam (ZOSYN) 3.375 (3-0.375) G injection    Comments:  STEIN BROOMER OLIVIA: cabinet override      01/17/15 1024 01/17/15 1024   01/17/15 1015  vancomycin (VANCOCIN) IVPB 1000 mg/200 mL premix     1,000 mg 200 mL/hr over 60 Minutes Intravenous  Once 01/17/15 1012 01/17/15 1125   01/17/15 1015  piperacillin-tazobactam  (ZOSYN) IVPB 3.375 g     3.375 g 100 mL/hr over 30 Minutes Intravenous  Once 01/17/15 1012 01/17/15 1054      Current Facility-Administered Medications  Medication Dose Route Frequency Provider Last Rate Last Dose  . acetaminophen (TYLENOL) tablet 650 mg  650 mg Oral Q6H PRN Hillary Bow, MD       Or  . acetaminophen (TYLENOL) suppository 650 mg  650 mg Rectal Q6H PRN Srikar Sudini, MD      . albuterol (PROVENTIL) (2.5 MG/3ML) 0.083% nebulizer solution 2.5 mg  2.5 mg Nebulization Q2H PRN Srikar Sudini, MD      . bisacodyl (DULCOLAX) suppository 10 mg  10 mg Rectal Daily PRN Srikar Sudini, MD      . cefTRIAXone (ROCEPHIN) 1 g in dextrose 5 % 50 mL IVPB  1 g Intravenous Q24H Srikar Sudini, MD      . darifenacin (ENABLEX) 24 hr tablet 7.5 mg  7.5 mg Oral Daily Srikar Sudini, MD      . dextrose 5 %-0.9 % sodium chloride infusion   Intravenous Continuous Srikar Sudini, MD      . diphenhydrAMINE (BENADRYL) capsule 50 mg  50 mg Oral QHS Srikar Sudini, MD      . docusate sodium (COLACE) capsule 100 mg  100 mg Oral BID Hillary Bow, MD   100 mg at 01/17/15 1406  . enoxaparin (LOVENOX) injection 30 mg  30 mg Subcutaneous  Q24H Srikar Sudini, MD      . FLUoxetine (PROZAC) capsule 40 mg  40 mg Oral Daily Srikar Sudini, MD      . hydrocortisone (ANUSOL-HC) suppository 25 mg  25 mg Rectal TID PRN Srikar Sudini, MD      . HYDROmorphone (DILAUDID) injection 2 mg  2 mg Intravenous Once Srikar Sudini, MD      . ipratropium-albuterol (DUONEB) 0.5-2.5 (3) MG/3ML nebulizer solution 3 mL  3 mL Nebulization Q6H PRN Srikar Sudini, MD      . Derrill Memo ON 01/18/2015] levothyroxine (SYNTHROID, LEVOTHROID) tablet 50 mcg  50 mcg Oral QAC breakfast Srikar Sudini, MD      . lidocaine (XYLOCAINE) 2 % jelly 1 application  1 application Urethral Once Roda Shutters, FNP      . menthol-cetylpyridinium (CEPACOL) lozenge 3 mg  1 lozenge Oral PRN Srikar Sudini, MD      . mometasone-formoterol (DULERA) 100-5 MCG/ACT inhaler 2  puff  2 puff Inhalation BID Srikar Sudini, MD      . morphine 4 MG/ML injection 4 mg  4 mg Intravenous Once PRN Srikar Sudini, MD      . ondansetron (ZOFRAN) tablet 4 mg  4 mg Oral Q6H PRN Srikar Sudini, MD       Or  . ondansetron (ZOFRAN) injection 4 mg  4 mg Intravenous Q6H PRN Srikar Sudini, MD      . risperiDONE (RISPERDAL) tablet 0.5 mg  0.5 mg Oral QHS Srikar Sudini, MD      . sodium bicarbonate tablet 650 mg  650 mg Oral BID Srikar Sudini, MD      . sodium chloride 0.9 % injection 3 mL  3 mL Intravenous Q12H Srikar Sudini, MD      . tamsulosin (FLOMAX) capsule 0.4 mg  0.4 mg Oral Daily Srikar Sudini, MD      . traMADol (ULTRAM) tablet 50 mg  50 mg Oral Q6H PRN Srikar Sudini, MD         Objective: Vital signs in last 24 hours: Temp:  [94.4 F (34.7 C)-97.8 F (36.6 C)] 97.7 F (36.5 C) (08/17 1358) Pulse Rate:  [59-92] 82 (08/17 1358) Resp:  [17-26] 20 (08/17 1358) BP: (121-146)/(79-93) 146/79 mmHg (08/17 1358) SpO2:  [100 %] 100 % (08/17 1358) Weight:  [145 lb (65.772 kg)] 145 lb (65.772 kg) (08/17 1358)  Intake/Output from previous day:   Intake/Output this shift: Total I/O In: -  Out: 2225 [Urine:2225]   Physical Exam  Constitutional: He is oriented to person, place, and time and well-developed, well-nourished, and in no distress.  HENT:  Head: Normocephalic and atraumatic.  Eyes: Pupils are equal, round, and reactive to light.  Neck: Neck supple.  Cardiovascular: Normal rate.   Pulmonary/Chest: Effort normal.  Abdominal: Soft. There is no tenderness.  Genitourinary: Penis normal.  No flank tenderness  Musculoskeletal: Normal range of motion.  Neurological: He is alert and oriented to person, place, and time.  Skin: Skin is warm and dry.  Retained suture from previous surgery protruding from right flank, removed by Dr. Junious Silk without difficultly    Lab Results:   Recent Labs  01/17/15 0947  WBC 22.0*  HGB 12.9*  HCT 39.3*  PLT 154    BMET  Recent Labs  01/17/15 0947  NA 127*  K 4.5  CL 96*  CO2 23  GLUCOSE 63*  BUN 29*  CREATININE 1.70*  CALCIUM 8.5*   PT/INR No results for input(s): LABPROT, INR in the last 72  hours. ABG No results for input(s): PHART, HCO3 in the last 72 hours.  Invalid input(s): PCO2, PO2  Studies/Results: Ct Head Wo Contrast  01/17/2015   CLINICAL DATA:  Fall  EXAM: CT HEAD WITHOUT CONTRAST  CT CERVICAL SPINE WITHOUT CONTRAST  TECHNIQUE: Multidetector CT imaging of the head and cervical spine was performed following the standard protocol without intravenous contrast. Multiplanar CT image reconstructions of the cervical spine were also generated.  COMPARISON:  11/02/2012  FINDINGS: CT HEAD FINDINGS  No skull fracture is noted. No intracranial hemorrhage, mass effect or midline shift. No acute cortical infarction. Paranasal sinuses and mastoid air cells are unremarkable. No hydrocephalus. The gray and white-matter differentiation is preserved. No mass lesion is noted on this unenhanced scan. Ventricular size is stable from prior exam.  CT CERVICAL SPINE FINDINGS  Axial images of the cervical spine shows no acute fracture or subluxation. Computer processed images shows no acute fracture or subluxation. Degenerative changes are noted C1-C2 articulation. There is disc space flattening with endplate sclerotic changes mild anterior and mild posterior spurring at C3-C4 and C5-C6 level. Disc space flattening with mild anterior spurring at C6-C7 level. No prevertebral soft tissue swelling. Cervical airway is patent. Mild posterior spurring at C6-C7 level.  There is no pneumothorax in visualized lung apices. 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 Multilevel facet degenerative changes are noted.  IMPRESSION: 1. No acute intracranial abnormality.  No significant change. 2. No cervical spine acute fracture or subluxation. Degenerative changes as described above.    Electronically Signed   By: Lahoma Crocker M.D.   On: 01/17/2015 11:41   Ct Cervical Spine Wo Contrast  01/17/2015   CLINICAL DATA:  Fall  EXAM: CT HEAD WITHOUT CONTRAST  CT CERVICAL SPINE WITHOUT CONTRAST  TECHNIQUE: Multidetector CT imaging of the head and cervical spine was performed following the standard protocol without intravenous contrast. Multiplanar CT image reconstructions of the cervical spine were also generated.  COMPARISON:  11/02/2012  FINDINGS: CT HEAD FINDINGS  No skull fracture is noted. No intracranial hemorrhage, mass effect or midline shift. No acute cortical infarction. Paranasal sinuses and mastoid air cells are unremarkable. No hydrocephalus. The gray and white-matter differentiation is preserved. No mass lesion is noted on this unenhanced scan. Ventricular size is stable from prior exam.  CT CERVICAL SPINE FINDINGS  Axial images of the cervical spine shows no acute fracture or subluxation. Computer processed images shows no acute fracture or subluxation. Degenerative changes are noted C1-C2 articulation. There is disc space flattening with endplate sclerotic changes mild anterior and mild posterior spurring at C3-C4 and C5-C6 level. Disc space flattening with mild anterior spurring at C6-C7 level. No prevertebral soft tissue swelling. Cervical airway is patent. Mild posterior spurring at C6-C7 level.  There is no pneumothorax in visualized lung apices. 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 Multilevel facet degenerative changes are noted.  IMPRESSION: 1. No acute intracranial abnormality.  No significant change. 2. No cervical spine acute fracture or subluxation. Degenerative changes as described above.   Electronically Signed   By: Lahoma Crocker M.D.   On: 01/17/2015 11:41   Ct Pelvis Wo Contrast  01/17/2015   CLINICAL DATA:  Probable urinary bladder stone  EXAM: CT PELVIS WITHOUT CONTRAST  TECHNIQUE: Multidetector CT imaging of the pelvis was performed  following the standard protocol without intravenous contrast.  COMPARISON:  11/06/2012  FINDINGS: Degenerative changes are noted bilateral SI joints. Mild degenerative changes lower  lumbar spine. Atherosclerotic calcifications of abdominal aorta and iliac arteries. No small bowel obstruction. Abundant stool and gas noted within rectum which is distended measures about 7.2 cm in diameter suspicious for mild fecal impaction. No thickened or dilated small bowel loop. No ascites or free air. There is a urinary bladder/penile catheter. There is a calcified calculus within bladder at the tip of catheter measures 6 mm. Significant thickening of urinary bladder wall highly suspicious for cystitis. There is no inguinal adenopathy. Pelvic phleboliths are again noted.  IMPRESSION: 1. There is urinary bladder catheter. There is a calcified bladder calculus at the tip of catheter measures 6 mm. 2. Significant thickening of the wall of urinary bladder highly suspicious for cystitis or chronic inflammation. 3. Abundant stool and gas noted within rectum measures 7.2 cm in diameter. Highly suspicious for mild fecal impaction. 4. No inguinal adenopathy.   Electronically Signed   By: Lahoma Crocker M.D.   On: 01/17/2015 13:38   Dg Chest Port 1 View  01/17/2015   CLINICAL DATA:  Increase shortness of breath today post fall. Generalized weakness, sepsis.  EXAM: PORTABLE CHEST - 1 VIEW  COMPARISON:  12/05/2013  FINDINGS: The heart size and mediastinal contours are within normal limits. Both lungs are clear. The visualized skeletal structures are unremarkable.  IMPRESSION: No active disease.   Electronically Signed   By: Rolm Baptise M.D.   On: 01/17/2015 10:24     Assessment: 59yo male with a history of multiple chronic health issues including CKD stage III, MI, seizures, stroke and nephrolithiasis. Urinary tract infection with elevated WBC likely related to chronic indwelling catheter. Significant calcification on tip of Foley seen  on CT making it difficult to remove. I attempted removal and was unsuccessful.  Foley was difficult to irrigate.  Patient tolerated well with only mild discomfort. Creatinine of 1.7 but not significantly elevated from previously documented baseline levels.  Plan: Continue foley due to chronic urinary retention  Dr. Junious Silk aware and requested to come evaluate patient. Prior to [place] arrival nurse reports patient became confused and accidentally stepped on tubing and pulled his catheter out.  Patient denies any penile pain and there is no urethral bleeding. Patient voided 163ml clear yellow urine without difficulty.  16Fr Foley catheter with return of 242ml clear urine. Will continue for now given patient's long standing history of chronic urinary retention with documented residual of >768mL.      Recommend continued Rocephin pending culture sensitivities. Monitor output. Will continue to follow patient.  Will discharge with Foley and have patient follow up with [facility] as this is where he has obtained all previous urologic care. Please call with any questions or concerns.  Cosign Dr. Junious Silk   LOS: 0 days    Herbert Moors 01/17/2015

## 2015-01-17 NOTE — ED Provider Notes (Signed)
[facility] Emergency Department Provider Note  ____________________________________________  Time seen: Approximately 9:42 AM  I have reviewed the triage vital signs and the nursing notes.   HISTORY  Chief Complaint Hypoglycemia    HPI Alan Small is a 59 y.o. male with chronic schizophrenia, history of nephrolithiasis, ureterolithiasis requiring multiple surgeries in the past including polymicrobial urinary tract infections, several in the past, with indwelling Foley catheter, history of hypothyroidism presents for evaluation from his group home for fall and altered mental status. Patient reports that he walked into a door yesterday. He doesn't remember how happened. This morning, the staff at the facility found him on the floor with decreased level of consciousness. On EMS arrival, his blood glucose was 11, they gave an amp of D50 and his blood sugar improved to the low 100s. His mental status improved as well. The remainder of his vital signs were stable according to EMS. Patient reports no chest pain, no abdominal pain no recent vomiting, diarrhea. Currently has no pain complaints. Current severity of symptoms is moderate. Glucose seems to transiently improve his hypoglycemia.   Past Medical History  Diagnosis Date  . Mental disorder   . Hypothyroidism   . Seizures   . Myocardial infarction   . Stroke   . Asthma     Patient Active Problem List   Diagnosis Date Noted  . Metabolic acidosis 0000000  . Acute renal failure 11/03/2012  . Hyperkalemia 11/03/2012  . Pyelonephritis 11/03/2012  . Schizophrenia 11/03/2012  . Hypothyroidism 11/03/2012  . Severe sepsis 11/03/2012  . Protein-calorie malnutrition, severe 11/03/2012  . Encephalopathy acute 11/03/2012    Past Surgical History  Procedure Laterality Date  . Appendectomy    . Colonoscopy with propofol N/A 12/07/2014    Procedure: COLONOSCOPY WITH PROPOFOL;  Surgeon: Manya Silvas, MD;   Location: [facility] ENDOSCOPY;  Service: Endoscopy;  Laterality: N/A;  . Esophagogastroduodenoscopy N/A 12/07/2014    Procedure: ESOPHAGOGASTRODUODENOSCOPY (EGD);  Surgeon: Manya Silvas, MD;  Location: [facility] ENDOSCOPY;  Service: Endoscopy;  Laterality: N/A;  . Nephrectomy      Current Outpatient Rx  Name  Route  Sig  Dispense  Refill  . albuterol-ipratropium (COMBIVENT) 18-103 MCG/ACT inhaler   Inhalation   Inhale 2 puffs into the lungs every 6 (six) hours as needed for wheezing.         . diphenhydrAMINE (BENADRYL) 50 MG capsule   Oral   Take 50 mg by mouth at bedtime.         [name] FLUoxetine (PROZAC) 40 MG capsule   Oral   Take 40 mg by mouth daily.         . Fluticasone-Salmeterol (ADVAIR) 250-50 MCG/DOSE AEPB   Inhalation   Inhale 1 puff into the lungs 2 (two) times daily.         . hydrocortisone (ANUSOL-HC) 25 MG suppository   Rectal   Place 25 mg rectally 3 (three) times daily as needed for hemorrhoids or itching.         . levothyroxine (SYNTHROID, LEVOTHROID) 50 MCG tablet   Oral   Take 50 mcg by mouth daily before breakfast.         . risperiDONE (RISPERDAL) 0.5 MG tablet   Oral   Take 0.5 mg by mouth at bedtime.         . sodium bicarbonate 650 MG tablet   Oral   Take 2 tablets (1,300 mg total) by mouth 2 (two) times daily. Patient taking differently:  Take 650 mg by mouth 2 (two) times daily.    20 tablet      . solifenacin (VESICARE) 5 MG tablet   Oral   Take 5 mg by mouth daily.         . tamsulosin (FLOMAX) 0.4 MG CAPS capsule   Oral   Take 0.4 mg by mouth daily.           Allergies Mellaril; Morphine and related; Navane; and Thorazine  Family History  Problem Relation Age of Onset  . Stroke Father     Social History Social History  Substance Use Topics  . Smoking status: Current Every Day Smoker -- 1.00 packs/day for 55 years    Types: Cigarettes  . Smokeless tobacco: Former Systems developer    Types: Chew  . Alcohol Use: No     Review of Systems Constitutional: No fever/chills Eyes: No visual changes. ENT: No sore throat. Cardiovascular: Denies chest pain. Respiratory: Denies shortness of breath. Gastrointestinal: No abdominal pain.  No nausea, no vomiting.  No diarrhea.  No constipation. Genitourinary: Negative for dysuria. Musculoskeletal: Negative for back pain. Skin: Negative for rash. Neurological: Negative for headaches, focal weakness or numbness.  10-point ROS otherwise negative.  ____________________________________________   PHYSICAL EXAM:  Filed Vitals:   01/17/15 1000 01/17/15 1030 01/17/15 1130 01/17/15 1142  BP: 135/93 141/83 134/91   Pulse: 79 76 92   Temp:    97.8 F (36.6 C)  TempSrc:    Oral  Resp: 26 21 18    Height:      Weight:      SpO2: 100% 100% 100%       Constitutional: Alert and oriented. Chronically ill-appearing and in no acute distress. Eyes: Conjunctivae are normal. PERRL. EOMI. Head: small abrasions to the nose, and left face without associated tenderness or bony abnormality Nose: No congestion/rhinnorhea. Mouth/Throat: Mucous membranes are dry.  Oropharynx non-erythematous. Neck: No stridor.   Cardiovascular: Normal rate, regular rhythm. Grossly normal heart sounds.  Good peripheral circulation. Respiratory: Normal respiratory effort.  No retractions. Lungs CTAB. Gastrointestinal: Soft and nontender. No distention. No abdominal bruits. No CVA tenderness. Genitourinary: chronic indwelling foley catheter in place Musculoskeletal: Bruising of left posterior chest wall, brusing left shoulder but full ROM left shoulder. Neurologic:  Normal speech and language. No gross focal neurologic deficits are appreciated. 5 out of 5 strength bilateral upper and lower external me, sensation intact to light touch throughout. Skin:  Skin is warm, dry and intact. No rash noted. Psychiatric: Mood and affect are normal. Speech and behavior are  normal.  ____________________________________________   LABS (all labs ordered are listed, but only abnormal results are displayed)  Labs Reviewed  GLUCOSE, CAPILLARY - Abnormal; Notable for the following:    Glucose-Capillary 64 (*)    All other components within normal limits  CBC WITH DIFFERENTIAL/PLATELET - Abnormal; Notable for the following:    WBC 22.0 (*)    RBC 4.31 (*)    Hemoglobin 12.9 (*)    HCT 39.3 (*)    RDW 15.3 (*)    Neutro Abs 20.2 (*)    All other components within normal limits  COMPREHENSIVE METABOLIC PANEL - Abnormal; Notable for the following:    Sodium 127 (*)    Chloride 96 (*)    Glucose, Bld 63 (*)    BUN 29 (*)    Creatinine, Ser 1.70 (*)    Calcium 8.5 (*)    Albumin 3.4 (*)    ALT 13 (*)  GFR calc non Af Amer 42 (*)    GFR calc Af Amer 49 (*)    All other components within normal limits  CK - Abnormal; Notable for the following:    Total CK 660 (*)    All other components within normal limits  URINALYSIS COMPLETEWITH MICROSCOPIC ([facility] ONLY) - Abnormal; Notable for the following:    Color, Urine STRAW (*)    APPearance HAZY (*)    Specific Gravity, Urine 1.003 (*)    Hgb urine dipstick 2+ (*)    Nitrite POSITIVE (*)    Leukocytes, UA 3+ (*)    Bacteria, UA MANY (*)    Squamous Epithelial / LPF 0-5 (*)    All other components within normal limits  LACTIC ACID, PLASMA - Abnormal; Notable for the following:    Lactic Acid, Venous 2.4 (*)    All other components within normal limits  GLUCOSE, CAPILLARY - Abnormal; Notable for the following:    Glucose-Capillary 37 (*)    All other components within normal limits  GLUCOSE, CAPILLARY - Abnormal; Notable for the following:    Glucose-Capillary 37 (*)    All other components within normal limits  CULTURE, BLOOD (ROUTINE X 2)  CULTURE, BLOOD (ROUTINE X 2)  TROPONIN I  GLUCOSE, CAPILLARY  LACTIC ACID, PLASMA  TSH  T4, FREE   ____________________________________________  EKG  ED  ECG REPORT I, Joanne Gavel, the attending physician, personally viewed and interpreted this ECG.   Date: 01/17/2015  EKG Time: 09:28  Rate: 62  Rhythm: normal EKG, normal sinus rhythm  Axis: normal  Intervals:none  ST&T Change: No acute ST segment elevation.  ____________________________________________  RADIOLOGY  CXR  FINDINGS: The heart size and mediastinal contours are within normal limits. Both lungs are clear. The visualized skeletal structures are unremarkable.  IMPRESSION: No active disease.   CT head and cervical spine  IMPRESSION: 1. No acute intracranial abnormality. No significant change. 2. No cervical spine acute fracture or subluxation. Degenerative changes as described above.  ____________________________________________   PROCEDURES  Procedure(s) performed: None  Critical Care performed: Yes, see critical care note(s). Total critical care time spent 35 minutes.  ____________________________________________   INITIAL IMPRESSION / ASSESSMENT AND PLAN / ED COURSE  Pertinent labs & imaging results that were available during my care of the patient were reviewed by me and considered in my medical decision making (see chart for details).  Alan Small is a 59 y.o. male with chronic schizophrenia, history of nephrolithiasis, ureterolithiasis requiring multiple surgeries in the past including polymicrobial urinary tract infections, several in the past, with indwelling Foley catheter, history of hypothyroidism presents for evaluation from his group home for fall and altered mental status. Glucose improved to the 60s on arrival however despite eating, his blood sugars dropped to the 30s so will give D50 and started on D10 drip. Additionally he is hypothermic, concerning to me for possible sepsis. We'll obtain labs, give liberal warmed normal saline, start bair hugger, give vancomycin and Zosyn. Blood cultures, chest x-ray, urinalysis pending. We'll obtain  CT head and C-spine given fall/trauma. Anticipate admission.   ----------------------------------------- 12:01 PM on 01/17/2015 ----------------------------------------- Lactic acid elevated at 2.4. Severe leukocytosis at 22,000 and given hypothermia, his clinical picture is consistent with sepsis secondary to urinary tract infection. He has mild hyponatremia and mild anemia. Temperature improving with bear hugger and warmed fluids. Blood glucose repeatedly drops despite him tolerating oral intake, and D50 was given and D10 drip initiated. Discussed with  hospitalist for admission. Unfortunately, we have been unable to remove his indwelling Foley catheter despite attempts to deflate the balloon. Will consult urology.  ----------------------------------------- 12:44 PM on 01/17/2015 -----------------------------------------  Discussed with Dr. Junious Silk of urology who recommends noncontrasted CT pelvis to evaluate for large/obstructing stone. Either he or Dr. Elnoria Howard will evaluate the patient.  ____________________________________________   FINAL CLINICAL IMPRESSION(S) / ED DIAGNOSES  Final diagnoses:  Hypothermia, initial encounter  Hypoglycemia  Sepsis secondary to UTI      Joanne Gavel, MD 01/17/15 1521

## 2015-01-17 NOTE — ED Notes (Signed)
When undressing patient, it was noted that patient has a urethral catheter to a leg bag.  Leg bag extremely full and adult brief pt had on was also full of urine.  Perineal area dirty with clumps of filth and redness that appears to be rash and excoriation present on bilateral buttocks and bilateral inner thighs.  Peri Care done.  MD aware.  Attempted to remove urethral catheter to replace with a clean/sterile one.  However, unable to pull any fluid from catheter balloon and meeting resistance when trying to remove catheter.  Catheter not removed.

## 2015-01-17 NOTE — ED Notes (Addendum)
Pt lives in family care facility.  Facility personnel called EMS around 0800 this morning reporting that patient was unresponsive on the floor, but had been ok at the last check at 0700.  Blood Glucose upon EMS arrival was 11.  25gm of Dextrose 50% was administered and BG improved dramatically to 130's.  Last EMS check, BG reported to be 99.  On arrival to ED, pt is A&Ox4, but slightly garbled speech.  BG 64.  Orange Juice given and patient drinking well.  Pt reports waking up this morning at 0700 because he fell out of the bed.

## 2015-01-17 NOTE — Plan of Care (Signed)
Problem: Phase I Progression Outcomes Goal: Initial discharge plan identified Outcome: Progressing Individualization: Pt is from a home care facility he has been there over a year. He fell this morning and his sugar was 11 when they checked it at group home. Pt has a chronic foley because of a long history of urinary retention, he used to see Dr. Jacqlyn Larsen but has not been in months he's reported. UA shows UTI and pt was clinically septic upon arrival to ER. Checking blood sugar every 2 hours and urology consult has been ordered.  Goals:  Urology to see pt about chronic foley, determine if a foley needs to be left in or changed Strict I/0 monitor fluid intake Monitor blood sugars, D5% 45 infusing at 125/ml Monitor food intake make sure pt is taking in adeqate food to maintain blood sugars ( add snacks) if necessary Educate on chronic foley use and that foley needs to be changed once a month to prevent clots infection.

## 2015-01-18 LAB — GLUCOSE, CAPILLARY
GLUCOSE-CAPILLARY: 105 mg/dL — AB (ref 65–99)
GLUCOSE-CAPILLARY: 108 mg/dL — AB (ref 65–99)
GLUCOSE-CAPILLARY: 127 mg/dL — AB (ref 65–99)
Glucose-Capillary: 93 mg/dL (ref 65–99)
Glucose-Capillary: 96 mg/dL (ref 65–99)
Glucose-Capillary: 98 mg/dL (ref 65–99)
Glucose-Capillary: 104 mg/dL — ABNORMAL HIGH (ref 65–99)
Glucose-Capillary: 123 mg/dL — ABNORMAL HIGH (ref 65–99)
Glucose-Capillary: 116 mg/dL — ABNORMAL HIGH (ref 65–99)

## 2015-01-18 LAB — BASIC METABOLIC PANEL
ANION GAP: 6 (ref 5–15)
BUN: 25 mg/dL — AB (ref 6–20)
CO2: 24 mmol/L (ref 22–32)
Calcium: 8.8 mg/dL — ABNORMAL LOW (ref 8.9–10.3)
Chloride: 109 mmol/L (ref 101–111)
Creatinine, Ser: 1.65 mg/dL — ABNORMAL HIGH (ref 0.61–1.24)
GFR calc Af Amer: 51 mL/min — ABNORMAL LOW (ref >60)
GFR, EST NON AFRICAN AMERICAN: 44 mL/min — AB (ref >60)
GLUCOSE: 100 mg/dL — AB (ref 65–99)
Potassium: 3.9 mmol/L (ref 3.5–5.1)
Sodium: 139 mmol/L (ref 135–145)

## 2015-01-18 LAB — CBC
HEMATOCRIT: 37.4 % — AB (ref 40.0–52.0)
HEMOGLOBIN: 12.4 g/dL — AB (ref 13.0–18.0)
MCH: 30.3 pg (ref 26.0–34.0)
MCHC: 33 g/dL (ref 32.0–36.0)
MCV: 91.8 fL (ref 80.0–100.0)
Platelets: 157 10*3/uL (ref 150–440)
RBC: 4.07 MIL/uL — ABNORMAL LOW (ref 4.40–5.90)
RDW: 15.6 % — AB (ref 11.5–14.5)
WBC: 13.5 10*3/uL — ABNORMAL HIGH (ref 3.8–10.6)

## 2015-01-18 LAB — URINE CULTURE

## 2015-01-18 NOTE — Care Management (Addendum)
Admitted to this facility with the diagnosis of urinary tract infection. A resident of [name] since 09/04/14. Sister is Marlowe Kays [phone]). Goes to [facility] and sees Dr. Rebeca Alert. Chronic foley. Takes care of activities of daily living at the facility except needs help with bath. Alert this morning to name. Shelbie Ammons RN MSN Care Management [phone]

## 2015-01-18 NOTE — Progress Notes (Signed)
[place] [facility] [place] NAME: Milen Treece    MR#:  [id number]  DATE OF BIRTH:  12-30-1955  SUBJECTIVE: 59 year old male patient with history of neurogenic bladder chronic Foley catheter brought in secondary to hypoglycemia hypothermia and a UTI. Patient also was admitted for altered mental status. Patient's blood sugars improved, he is alert and oriented.  denies any complaints.   CHIEF COMPLAINT:   Chief Complaint  Patient presents with  . Hypoglycemia    REVIEW OF SYSTEMS:   ROS CONSTITUTIONAL: No fever, fatigue or weakness.  EYES: No blurred or double vision.  EARS, NOSE, AND THROAT: No tinnitus or ear pain.  RESPIRATORY: No cough, shortness of breath, wheezing or hemoptysis.  CARDIOVASCULAR: No chest pain, orthopnea, edema.  GASTROINTESTINAL: No nausea, vomiting, diarrhea or abdominal pain.  GENITOURINARY: No dysuria, hematuria.  ENDOCRINE: No polyuria, nocturia,  HEMATOLOGY: No anemia, easy bruising or bleeding SKIN: No rash or lesion. MUSCULOSKELETAL: No joint pain or arthritis.   NEUROLOGIC: No tingling, numbness, weakness.  PSYCHIATRY: No anxiety or depression.   DRUG ALLERGIES:   Allergies  Allergen Reactions  . Mellaril [Thioridazine] Other (See Comments)    Unknown   . Morphine And Related Itching  . Navane [Thiothixene] Other (See Comments)    GI Distress  . Thorazine [Chlorpromazine] Other (See Comments)    Unknown    VITALS:  Blood pressure 136/77, pulse 79, temperature 98.5 F (36.9 C), temperature source Oral, resp. rate 18, height 6' (1.829 m), weight 69.945 kg (154 lb 3.2 oz), SpO2 100 %.  PHYSICAL EXAMINATION:  GENERAL:  59 y.o.-year-old patient lying in the bed with no acute distress.  EYES: Pupils equal, round, reactive to light and accommodation. No scleral icterus. Extraocular muscles intact.  HEENT: Head atraumatic, normocephalic. Oropharynx and nasopharynx clear.  NECK:  Supple, no jugular  venous distention. No thyroid enlargement, no tenderness.  LUNGS: Normal breath sounds bilaterally, no wheezing, rales,rhonchi or crepitation. No use of accessory muscles of respiration.  CARDIOVASCULAR: S1, S2 normal. No murmurs, rubs, or gallops.  ABDOMEN: Soft, nontender, nondistended. Bowel sounds present. No organomegaly or mass.  EXTREMITIES: No pedal edema, cyanosis, or clubbing.  NEUROLOGIC: Cranial nerves II through XII are intact. Muscle strength 5/5 in all extremities. Sensation intact. Gait not checked.  PSYCHIATRIC: The patient is alert and oriented x 3.  SKIN: No obvious rash, lesion, or ulcer.    LABORATORY PANEL:   CBC  Recent Labs Lab 01/18/15 0432  WBC 13.5*  HGB 12.4*  HCT 37.4*  PLT 157   ------------------------------------------------------------------------------------------------------------------  Chemistries   Recent Labs Lab 01/17/15 0947 01/18/15 0432  NA 127* 139  K 4.5 3.9  CL 96* 109  CO2 23 24  GLUCOSE 63* 100*  BUN 29* 25*  CREATININE 1.70* 1.65*  CALCIUM 8.5* 8.8*  AST 34  --   ALT 13*  --   ALKPHOS 87  --   BILITOT 0.4  --    ------------------------------------------------------------------------------------------------------------------  Cardiac Enzymes  Recent Labs Lab 01/17/15 0947  TROPONINI <0.03   ------------------------------------------------------------------------------------------------------------------  RADIOLOGY:  Ct Head Wo Contrast  01/17/2015   CLINICAL DATA:  Fall  EXAM: CT HEAD WITHOUT CONTRAST  CT CERVICAL SPINE WITHOUT CONTRAST  TECHNIQUE: Multidetector CT imaging of the head and cervical spine was performed following the standard protocol without intravenous contrast. Multiplanar CT image reconstructions of the cervical spine were also generated.  COMPARISON:  11/02/2012  FINDINGS: CT HEAD FINDINGS  No skull fracture is noted.  No intracranial hemorrhage, mass effect or midline shift. No acute cortical  infarction. Paranasal sinuses and mastoid air cells are unremarkable. No hydrocephalus. The gray and white-matter differentiation is preserved. No mass lesion is noted on this unenhanced scan. Ventricular size is stable from prior exam.  CT CERVICAL SPINE FINDINGS  Axial images of the cervical spine shows no acute fracture or subluxation. Computer processed images shows no acute fracture or subluxation. Degenerative changes are noted C1-C2 articulation. There is disc space flattening with endplate sclerotic changes mild anterior and mild posterior spurring at C3-C4 and C5-C6 level. Disc space flattening with mild anterior spurring at C6-C7 level. No prevertebral soft tissue swelling. Cervical airway is patent. Mild posterior spurring at C6-C7 level.  There is no pneumothorax in visualized lung apices. 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 Multilevel facet degenerative changes are noted.  IMPRESSION: 1. No acute intracranial abnormality.  No significant change. 2. No cervical spine acute fracture or subluxation. Degenerative changes as described above.   Electronically Signed   By: Lahoma Crocker M.D.   On: 01/17/2015 11:41   Ct Cervical Spine Wo Contrast  01/17/2015   CLINICAL DATA:  Fall  EXAM: CT HEAD WITHOUT CONTRAST  CT CERVICAL SPINE WITHOUT CONTRAST  TECHNIQUE: Multidetector CT imaging of the head and cervical spine was performed following the standard protocol without intravenous contrast. Multiplanar CT image reconstructions of the cervical spine were also generated.  COMPARISON:  11/02/2012  FINDINGS: CT HEAD FINDINGS  No skull fracture is noted. No intracranial hemorrhage, mass effect or midline shift. No acute cortical infarction. Paranasal sinuses and mastoid air cells are unremarkable. No hydrocephalus. The gray and white-matter differentiation is preserved. No mass lesion is noted on this unenhanced scan. Ventricular size is stable from prior exam.  CT CERVICAL SPINE  FINDINGS  Axial images of the cervical spine shows no acute fracture or subluxation. Computer processed images shows no acute fracture or subluxation. Degenerative changes are noted C1-C2 articulation. There is disc space flattening with endplate sclerotic changes mild anterior and mild posterior spurring at C3-C4 and C5-C6 level. Disc space flattening with mild anterior spurring at C6-C7 level. No prevertebral soft tissue swelling. Cervical airway is patent. Mild posterior spurring at C6-C7 level.  There is no pneumothorax in visualized lung apices. 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 Multilevel facet degenerative changes are noted.  IMPRESSION: 1. No acute intracranial abnormality.  No significant change. 2. No cervical spine acute fracture or subluxation. Degenerative changes as described above.   Electronically Signed   By: Lahoma Crocker M.D.   On: 01/17/2015 11:41   Ct Pelvis Wo Contrast  01/17/2015   CLINICAL DATA:  Probable urinary bladder stone  EXAM: CT PELVIS WITHOUT CONTRAST  TECHNIQUE: Multidetector CT imaging of the pelvis was performed following the standard protocol without intravenous contrast.  COMPARISON:  11/06/2012  FINDINGS: Degenerative changes are noted bilateral SI joints. Mild degenerative changes lower lumbar spine. Atherosclerotic calcifications of abdominal aorta and iliac arteries. No small bowel obstruction. Abundant stool and gas noted within rectum which is distended measures about 7.2 cm in diameter suspicious for mild fecal impaction. No thickened or dilated small bowel loop. No ascites or free air. There is a urinary bladder/penile catheter. There is a calcified calculus within bladder at the tip of catheter measures 6 mm. Significant thickening of urinary bladder wall highly suspicious for cystitis. There is no inguinal adenopathy. Pelvic phleboliths are again noted.  IMPRESSION:  1. There is urinary bladder catheter. There is a calcified  bladder calculus at the tip of catheter measures 6 mm. 2. Significant thickening of the wall of urinary bladder highly suspicious for cystitis or chronic inflammation. 3. Abundant stool and gas noted within rectum measures 7.2 cm in diameter. Highly suspicious for mild fecal impaction. 4. No inguinal adenopathy.   Electronically Signed   By: Lahoma Crocker M.D.   On: 01/17/2015 13:38   Dg Chest Port 1 View  01/17/2015   CLINICAL DATA:  Increase shortness of breath today post fall. Generalized weakness, sepsis.  EXAM: PORTABLE CHEST - 1 VIEW  COMPARISON:  12/05/2013  FINDINGS: The heart size and mediastinal contours are within normal limits. Both lungs are clear. The visualized skeletal structures are unremarkable.  IMPRESSION: No active disease.   Electronically Signed   By: Rolm Baptise M.D.   On: 01/17/2015 10:24    EKG:   Orders placed or performed during the hospital encounter of 01/17/15  . ED EKG  . ED EKG    ASSESSMENT AND PLAN:  1. Hypoglycemia hypothermia secondary to severe sepsis: Patient temperature improved hypoglycemia improved patient is urine cultures are pending. 2.Sepsis secondary to UTI, catheter related UTI. Patient to seen by urology., Currently replaced yesterday. Follow the urine cultures. Patient advised to follow up with [facility] urology. Has history of neurogenic bladder.  3.Altered mental status secondary to hypoglycemia and hypothermia patient to mental status is improved. Blood sugars improved. 4.h/o hypothyroidism; continue synthroid 5.copd; continue inhalers  acute renal failure and CK distress 3 renal failure improving with Fluids. History of schizophrenia continue home medications    All the records are reviewed and case discussed with Care Management/Social Workerr. Management plans discussed with the patient, family and they are in agreement.  CODE STATUS: full  TOTAL TIME TAKING CARE OF THIS PATIENT: 35 minutes.   POSSIBLE D/C IN 1-2DAYS, DEPENDING ON  CLINICAL CONDITION.   Epifanio Lesches M.D on 01/18/2015 at 12:38 PM  Between 7am to 6pm - Pager - [phone]  After 6pm go to [email or web address] - password [email or web address] [facility]  Office  [phone]  CC: Primary care physician; [name], MD

## 2015-01-18 NOTE — Progress Notes (Deleted)
Pt has severe insomnia.  5mg  Ambien did not help her to sleep last night.  Dr. Posey Pronto ordered 10mg  at bedtime.

## 2015-01-18 NOTE — Progress Notes (Signed)
Urology Consult Follow Up  Subjective: Alan Small is a 59yo male with a history of multiple chronic health issues including CKD stage III, MI, seizures, stroke and nephrolithiasis. He has a chronic indwelling Foley catheter and initially presented to the ED after being found unresponsive at his group home. EMS found his blood glucose to be 11 and he was given D50. Labs drawn in ED remarkable for elevated WBC of 22 and Creatinine of 1.70. Urine positive for +3 leuks, +nitrites, RBC +2 and many bacteria. Patient mental status appears back to baseline and he states that he feels well other than a headache related to his fall yesterday. He is receiving IV Rocephin as inpatient pending culture results. WBC improving.  CT pelvis showed significant calcification at tip of catheter balloon measuring 6 mm and Urology was consulted to assist with removal. Catheter balloon was flushed by myself yesterday but catheter was ultimately removed by patient when he stepped on the tubing pulling out his catheter.  There was no urethral trauma.  He is a patient of Dr. Jacqlyn Larsen and Dr. Aleene Davidson with [facility] for management of significant nephrolithiasis but patient states that it has been sometime since he has seen him. 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     Anti-infectives: Anti-infectives    Start     Dose/Rate Route Frequency Ordered Stop   01/17/15 1215  cefTRIAXone (ROCEPHIN) 1 g in dextrose 5 % 50 mL IVPB     1 g 100 mL/hr over 30 Minutes Intravenous Every 24 hours 01/17/15 1207     01/17/15 1024  piperacillin-tazobactam (ZOSYN) 3.375 (3-0.375) G injection    Comments:  STEIN BROOMER OLIVIA: cabinet override      01/17/15 1024 01/17/15 1024   01/17/15 1015  vancomycin (VANCOCIN) IVPB 1000 mg/200 mL premix     1,000 mg 200 mL/hr over 60 Minutes Intravenous  Once 01/17/15 1012 01/17/15 1125   01/17/15 1015   piperacillin-tazobactam (ZOSYN) IVPB 3.375 g     3.375 g 100 mL/hr over 30 Minutes Intravenous  Once 01/17/15 1012 01/17/15 1054      Current Facility-Administered Medications  Medication Dose Route Frequency Provider Last Rate Last Dose  . acetaminophen (TYLENOL) tablet 650 mg  650 mg Oral Q6H PRN Hillary Bow, MD   650 mg at 01/18/15 0813   Or  . acetaminophen (TYLENOL) suppository 650 mg  650 mg Rectal Q6H PRN Srikar Sudini, MD      . albuterol (PROVENTIL) (2.5 MG/3ML) 0.083% nebulizer solution 2.5 mg  2.5 mg Nebulization Q2H PRN Srikar Sudini, MD      . bisacodyl (DULCOLAX) suppository 10 mg  10 mg Rectal Daily PRN Srikar Sudini, MD      . cefTRIAXone (ROCEPHIN) 1 g in dextrose 5 % 50 mL IVPB  1 g Intravenous Q24H Hillary Bow, MD   1 g at 01/17/15 2159  . darifenacin (ENABLEX) 24 hr tablet 7.5 mg  7.5 mg Oral Daily Srikar Sudini, MD   7.5 mg at 01/17/15 1445  . dextrose 5 %-0.9 % sodium chloride infusion   Intravenous Continuous Hillary Bow, MD 125 mL/hr at 01/18/15 0434    . diphenhydrAMINE (BENADRYL) capsule 50 mg  50 mg Oral QHS Hillary Bow, MD   50 mg at 01/17/15 2005  . docusate sodium (COLACE) capsule 100 mg  100 mg Oral BID Hillary Bow, MD   100 mg at 01/17/15 2004  . enoxaparin (LOVENOX) injection 30  mg  30 mg Subcutaneous Q24H Hillary Bow, MD   30 mg at 01/17/15 2006  . FLUoxetine (PROZAC) capsule 40 mg  40 mg Oral Daily Srikar Sudini, MD   40 mg at 01/17/15 1635  . hydrocortisone (ANUSOL-HC) suppository 25 mg  25 mg Rectal TID PRN Hillary Bow, MD      . HYDROmorphone (DILAUDID) injection 2 mg  2 mg Intravenous Once Hillary Bow, MD   2 mg at 01/17/15 1530  . ipratropium-albuterol (DUONEB) 0.5-2.5 (3) MG/3ML nebulizer solution 3 mL  3 mL Nebulization Q6H PRN Srikar Sudini, MD      . levothyroxine (SYNTHROID, LEVOTHROID) tablet 50 mcg  50 mcg Oral QAC breakfast Hillary Bow, MD   50 mcg at 01/18/15 0813  . lidocaine (XYLOCAINE) 2 % jelly 1 application  1 application  Urethral Once Roda Shutters, FNP   1 application at [id number]  . menthol-cetylpyridinium (CEPACOL) lozenge 3 mg  1 lozenge Oral PRN Hillary Bow, MD      . mometasone-formoterol (DULERA) 100-5 MCG/ACT inhaler 2 puff  2 puff Inhalation BID Hillary Bow, MD   2 puff at 01/18/15 0813  . morphine 4 MG/ML injection 4 mg  4 mg Intravenous Once PRN Srikar Sudini, MD      . ondansetron (ZOFRAN) tablet 4 mg  4 mg Oral Q6H PRN Hillary Bow, MD       Or  . ondansetron (ZOFRAN) injection 4 mg  4 mg Intravenous Q6H PRN Srikar Sudini, MD      . risperiDONE (RISPERDAL) tablet 0.5 mg  0.5 mg Oral QHS Srikar Sudini, MD   0.5 mg at 01/17/15 2004  . sodium bicarbonate tablet 650 mg  650 mg Oral BID Hillary Bow, MD   650 mg at 01/17/15 2005  . sodium chloride 0.9 % injection 3 mL  3 mL Intravenous Q12H Hillary Bow, MD   3 mL at 01/17/15 2012  . tamsulosin (FLOMAX) capsule 0.4 mg  0.4 mg Oral Daily Hillary Bow, MD   0.4 mg at 01/17/15 1834  . traMADol (ULTRAM) tablet 50 mg  50 mg Oral Q6H PRN Hillary Bow, MD   50 mg at 01/18/15 0453     Objective: Vital signs in last 24 hours: Temp:  [94.4 F (34.7 C)-98.5 F (36.9 C)] 98.5 F (36.9 C) (08/18 0501) Pulse Rate:  [59-92] 79 (08/18 0501) Resp:  [17-26] 18 (08/18 0501) BP: (121-146)/(71-93) 136/77 mmHg (08/18 0501) SpO2:  [100 %] 100 % (08/18 0501) Weight:  [145 lb (65.772 kg)-154 lb 3.2 oz (69.945 kg)] 154 lb 3.2 oz (69.945 kg) (08/18 0500)  Intake/Output from previous day: 08/17 0701 - 08/18 0700 In: 240 [P.O.:240] Out: 6525 [Urine:6525] Intake/Output this shift:     Physical Exam  Lab Results:   Recent Labs  01/17/15 0947 01/18/15 0432  WBC 22.0* 13.5*  HGB 12.9* 12.4*  HCT 39.3* 37.4*  PLT 154 157   BMET  Recent Labs  01/17/15 0947 01/18/15 0432  NA 127* 139  K 4.5 3.9  CL 96* 109  CO2 23 24  GLUCOSE 63* 100*  BUN 29* 25*  CREATININE 1.70* 1.65*  CALCIUM 8.5* 8.8*   PT/INR No results for input(s):  LABPROT, INR in the last 72 hours. ABG No results for input(s): PHART, HCO3 in the last 72 hours.  Invalid input(s): PCO2, PO2  Studies/Results: Ct Head Wo Contrast  01/17/2015   CLINICAL DATA:  Fall  EXAM: CT HEAD WITHOUT CONTRAST  CT CERVICAL SPINE WITHOUT CONTRAST  TECHNIQUE: Multidetector CT imaging of the head and cervical spine was performed following the standard protocol without intravenous contrast. Multiplanar CT image reconstructions of the cervical spine were also generated.  COMPARISON:  11/02/2012  FINDINGS: CT HEAD FINDINGS  No skull fracture is noted. No intracranial hemorrhage, mass effect or midline shift. No acute cortical infarction. Paranasal sinuses and mastoid air cells are unremarkable. No hydrocephalus. The gray and white-matter differentiation is preserved. No mass lesion is noted on this unenhanced scan. Ventricular size is stable from prior exam.  CT CERVICAL SPINE FINDINGS  Axial images of the cervical spine shows no acute fracture or subluxation. Computer processed images shows no acute fracture or subluxation. Degenerative changes are noted C1-C2 articulation. There is disc space flattening with endplate sclerotic changes mild anterior and mild posterior spurring at C3-C4 and C5-C6 level. Disc space flattening with mild anterior spurring at C6-C7 level. No prevertebral soft tissue swelling. Cervical airway is patent. Mild posterior spurring at C6-C7 level.  There is no pneumothorax in visualized lung apices. 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 Multilevel facet degenerative changes are noted.  IMPRESSION: 1. No acute intracranial abnormality.  No significant change. 2. No cervical spine acute fracture or subluxation. Degenerative changes as described above.   Electronically Signed   By: Lahoma Crocker M.D.   On: 01/17/2015 11:41   Ct Cervical Spine Wo Contrast  01/17/2015   CLINICAL DATA:  Fall  EXAM: CT HEAD WITHOUT CONTRAST  CT CERVICAL  SPINE WITHOUT CONTRAST  TECHNIQUE: Multidetector CT imaging of the head and cervical spine was performed following the standard protocol without intravenous contrast. Multiplanar CT image reconstructions of the cervical spine were also generated.  COMPARISON:  11/02/2012  FINDINGS: CT HEAD FINDINGS  No skull fracture is noted. No intracranial hemorrhage, mass effect or midline shift. No acute cortical infarction. Paranasal sinuses and mastoid air cells are unremarkable. No hydrocephalus. The gray and white-matter differentiation is preserved. No mass lesion is noted on this unenhanced scan. Ventricular size is stable from prior exam.  CT CERVICAL SPINE FINDINGS  Axial images of the cervical spine shows no acute fracture or subluxation. Computer processed images shows no acute fracture or subluxation. Degenerative changes are noted C1-C2 articulation. There is disc space flattening with endplate sclerotic changes mild anterior and mild posterior spurring at C3-C4 and C5-C6 level. Disc space flattening with mild anterior spurring at C6-C7 level. No prevertebral soft tissue swelling. Cervical airway is patent. Mild posterior spurring at C6-C7 level.  There is no pneumothorax in visualized lung apices. 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 Multilevel facet degenerative changes are noted.  IMPRESSION: 1. No acute intracranial abnormality.  No significant change. 2. No cervical spine acute fracture or subluxation. Degenerative changes as described above.   Electronically Signed   By: Lahoma Crocker M.D.   On: 01/17/2015 11:41   Ct Pelvis Wo Contrast  01/17/2015   CLINICAL DATA:  Probable urinary bladder stone  EXAM: CT PELVIS WITHOUT CONTRAST  TECHNIQUE: Multidetector CT imaging of the pelvis was performed following the standard protocol without intravenous contrast.  COMPARISON:  11/06/2012  FINDINGS: Degenerative changes are noted bilateral SI joints. Mild degenerative changes lower  lumbar spine. Atherosclerotic calcifications of abdominal aorta and iliac arteries. No small bowel obstruction. Abundant stool and gas noted within rectum which is distended measures about 7.2 cm in diameter suspicious for mild fecal impaction. No thickened or dilated small bowel loop. No ascites or  free air. There is a urinary bladder/penile catheter. There is a calcified calculus within bladder at the tip of catheter measures 6 mm. Significant thickening of urinary bladder wall highly suspicious for cystitis. There is no inguinal adenopathy. Pelvic phleboliths are again noted.  IMPRESSION: 1. There is urinary bladder catheter. There is a calcified bladder calculus at the tip of catheter measures 6 mm. 2. Significant thickening of the wall of urinary bladder highly suspicious for cystitis or chronic inflammation. 3. Abundant stool and gas noted within rectum measures 7.2 cm in diameter. Highly suspicious for mild fecal impaction. 4. No inguinal adenopathy.   Electronically Signed   By: Lahoma Crocker M.D.   On: 01/17/2015 13:38   Dg Chest Port 1 View  01/17/2015   CLINICAL DATA:  Increase shortness of breath today post fall. Generalized weakness, sepsis.  EXAM: PORTABLE CHEST - 1 VIEW  COMPARISON:  12/05/2013  FINDINGS: The heart size and mediastinal contours are within normal limits. Both lungs are clear. The visualized skeletal structures are unremarkable.  IMPRESSION: No active disease.   Electronically Signed   By: Rolm Baptise M.D.   On: 01/17/2015 10:24     Assessment: Foley replaced successfully yesterday.  Urine clear in drainage bag today.  Patient doing well.  WBC improving. UO good. Creatinine and VSS stable.    Plan: Will follow while inpatient. D/C with Foley/antibiotics f/u with [facility] concerning management of  Chronic Foley and neurogenic bladder.  CC:     LOS: 1 day    Herbert Moors 01/18/2015

## 2015-01-18 NOTE — Plan of Care (Addendum)
Problem: Discharge Progression Outcomes Goal: Other Discharge Outcomes/Goals Outcome: Progressing Plan of care progress to goals: 1. C/o headache throughout the day. Alternating Tylenol & Tramadol are effective relief.  2. Hemodynamically:              -VSS, afebrile, IVF infusing, IV abx             -stable blood sugars             -foley catheter in place adequately draining 3. Tolerating regular diet well, good appetite.  4. +1 standby assist for safety. Bed alarm on. Pt understands how to use call system for assistance

## 2015-01-18 NOTE — Plan of Care (Signed)
Problem: Discharge Progression Outcomes Goal: Other Discharge Outcomes/Goals Outcome: Progressing Plan of care progress to goal: Pt lives in a group home where he was found unconscious with a CBG of 11. VSS Activity - pt stayed in bed all day today

## 2015-01-19 LAB — BASIC METABOLIC PANEL
Anion gap: 4 — ABNORMAL LOW (ref 5–15)
BUN: 16 mg/dL (ref 6–20)
CALCIUM: 9 mg/dL (ref 8.9–10.3)
CO2: 26 mmol/L (ref 22–32)
CREATININE: 1.61 mg/dL — AB (ref 0.61–1.24)
Chloride: 112 mmol/L — ABNORMAL HIGH (ref 101–111)
GFR calc Af Amer: 52 mL/min — ABNORMAL LOW (ref >60)
GFR calc non Af Amer: 45 mL/min — ABNORMAL LOW (ref >60)
GLUCOSE: 82 mg/dL (ref 65–99)
Potassium: 4.5 mmol/L (ref 3.5–5.1)
Sodium: 142 mmol/L (ref 135–145)

## 2015-01-19 LAB — CBC
HEMATOCRIT: 37 % — AB (ref 40.0–52.0)
Hemoglobin: 12.2 g/dL — ABNORMAL LOW (ref 13.0–18.0)
MCH: 30.4 pg (ref 26.0–34.0)
MCHC: 33 g/dL (ref 32.0–36.0)
MCV: 92.1 fL (ref 80.0–100.0)
PLATELETS: 152 10*3/uL (ref 150–440)
RBC: 4.02 MIL/uL — ABNORMAL LOW (ref 4.40–5.90)
RDW: 15.8 % — AB (ref 11.5–14.5)
WBC: 10.6 10*3/uL (ref 3.8–10.6)

## 2015-01-19 LAB — GLUCOSE, CAPILLARY
GLUCOSE-CAPILLARY: 100 mg/dL — AB (ref 65–99)
GLUCOSE-CAPILLARY: 101 mg/dL — AB (ref 65–99)
GLUCOSE-CAPILLARY: 126 mg/dL — AB (ref 65–99)
GLUCOSE-CAPILLARY: 68 mg/dL (ref 65–99)
GLUCOSE-CAPILLARY: 77 mg/dL (ref 65–99)
GLUCOSE-CAPILLARY: 90 mg/dL (ref 65–99)
GLUCOSE-CAPILLARY: 91 mg/dL (ref 65–99)
Glucose-Capillary: 89 mg/dL (ref 65–99)
Glucose-Capillary: 102 mg/dL — ABNORMAL HIGH (ref 65–99)
Glucose-Capillary: 98 mg/dL (ref 65–99)
Glucose-Capillary: 94 mg/dL (ref 65–99)
Glucose-Capillary: 85 mg/dL (ref 65–99)

## 2015-01-19 MED ORDER — DEXTROSE 10 % IV SOLN
INTRAVENOUS | Status: DC
  Administered 2015-01-19: 08:00:00 via INTRAVENOUS

## 2015-01-19 MED ORDER — ENOXAPARIN SODIUM 40 MG/0.4ML ~~LOC~~ SOLN
40.0000 mg | SUBCUTANEOUS | Status: DC
  Administered 2015-01-19 – 2015-01-22 (×4): 40 mg via SUBCUTANEOUS
  Filled 2015-01-19 (×4): qty 0.4

## 2015-01-19 MED ORDER — NYSTATIN 100000 UNIT/ML MT SUSP
5.0000 mL | Freq: Four times a day (QID) | OROMUCOSAL | Status: DC
  Administered 2015-01-19 – 2015-01-22 (×10): 500000 [IU] via ORAL
  Filled 2015-01-19 (×11): qty 5

## 2015-01-19 NOTE — Plan of Care (Signed)
Problem: Discharge Progression Outcomes Goal: Other Discharge Outcomes/Goals Plan of care progress to goals: 1. C/o headache throughout the day. Tramadol effectively relieved pain. 2. Hemodynamically:              -VSS, afebrile, D10 infusing, IV abx             -blood sugars stable, endocrinology consulted             -extreme amount of urine output. MD aware 3. Tolerating regular diet well, good appetite.   4. +1 standby assist for safety. Bed alarm on. Pt understands how to use call system for assistance

## 2015-01-19 NOTE — Care Management Important Message (Signed)
Important Message  Patient Details  Name: Alan Small MRN: [id number] Date of Birth: 04/22/1956   Medicare Important Message Given:  Yes-second notification given    Juliann Pulse A Allmond 01/19/2015, 10:10 AM

## 2015-01-19 NOTE — Progress Notes (Addendum)
Dr. Vianne Bulls notified pt blood sugar 68 this morning. Has been trending down through the night despite multiple sodas & D5NS @ 15ml/hr. Pt output greater than 8093mL in 24hr. MD acknowledged. Verbal order read back & verified for D10@40  ml/hr. Will continue to assess closely.

## 2015-01-19 NOTE — Progress Notes (Signed)
Paged MD with pt update:  Urine output in 20 hour period measured 7,332ml.  CBGs during the night between 77 - 100.  Pt continues to receive D5 in 0.9%NS at 11ml/hr and he is drinking several regular colas a shift.  I discussed the possibility of checking the pt for Diabetes Incipidus.  Dr. Reece Levy suggested that I leave a sticky note for the am doctors to assess.

## 2015-01-19 NOTE — Progress Notes (Signed)
Blood sugar improved to 89 after starting D10 @ 40 ml/hr and giving orange juice. Pt now sitting up eating breakfast.

## 2015-01-19 NOTE — Progress Notes (Signed)
[facility] [place] NAME: Alan Small    MR#:  [id number]  [place]:  18-Jul-1955  Seen today, mainly issues are today hypoglycemia despite continuous aggressive hydration with D5 100 cc an hour, polyuria with a 8000 mL of urine in 24 hours. Patient denies any complaints. His sugar 68 this morning and treated down despite multiple sodas. Blood sugar blood sugar improved to 89 after orange juice. This drinks a lot of water secondary to urinary urine infections.   CHIEF COMPLAINT:   Chief Complaint  Patient presents with  . Hypoglycemia    REVIEW OF SYSTEMS:   ROS CONSTITUTIONAL: No fever, fatigue or weakness.  EYES: No blurred or double vision.  EARS, NOSE, AND THROAT: No tinnitus or ear pain.  RESPIRATORY: No cough, shortness of breath, wheezing or hemoptysis.  CARDIOVASCULAR: No chest pain, orthopnea, edema.  GASTROINTESTINAL: No nausea, vomiting, diarrhea or abdominal pain.  GENITOURINARY: No dysuria, hematuria.  ENDOCRINE: No polyuria, nocturia,  HEMATOLOGY: No anemia, easy bruising or bleeding SKIN: No rash or lesion. MUSCULOSKELETAL: No joint pain or arthritis.   NEUROLOGIC: No tingling, numbness, weakness.  PSYCHIATRY: No anxiety or depression.   DRUG ALLERGIES:   Allergies  Allergen Reactions  . Mellaril [Thioridazine] Other (See Comments)    Unknown   . Morphine And Related Itching  . Navane [Thiothixene] Other (See Comments)    GI Distress  . Thorazine [Chlorpromazine] Other (See Comments)    Unknown    VITALS:  Blood pressure 150/92, pulse 71, temperature 98.6 F (37 C), temperature source Oral, resp. rate 18, height 6' (1.829 m), weight 70.761 kg (156 lb), SpO2 99 %.  PHYSICAL EXAMINATION:  GENERAL:  59 y.o.-year-old patient lying in the bed with no acute distress.  EYES: Pupils equal, round, reactive to light and accommodation. No scleral icterus. Extraocular muscles intact.  HEENT: Head  atraumatic, normocephalic. Oropharynx and nasopharynx clear.  NECK:  Supple, no jugular venous distention. No thyroid enlargement, no tenderness.  LUNGS: Normal breath sounds bilaterally, no wheezing, rales,rhonchi or crepitation. No use of accessory muscles of respiration.  CARDIOVASCULAR: S1, S2 normal. No murmurs, rubs, or gallops.  ABDOMEN: Soft, nontender, nondistended. Bowel sounds present. No organomegaly or mass.  EXTREMITIES: No pedal edema, cyanosis, or clubbing.  NEUROLOGIC: Cranial nerves II through XII are intact. Muscle strength 5/5 in all extremities. Sensation intact. Gait not checked.  PSYCHIATRIC: The patient is alert and oriented x 3.  SKIN: No obvious rash, lesion, or ulcer.    LABORATORY PANEL:   CBC  Recent Labs Lab 01/19/15 0509  WBC 10.6  HGB 12.2*  HCT 37.0*  PLT 152   ------------------------------------------------------------------------------------------------------------------  Chemistries   Recent Labs Lab 01/17/15 0947  01/19/15 0509  NA 127*  < > 142  K 4.5  < > 4.5  CL 96*  < > 112*  CO2 23  < > 26  GLUCOSE 63*  < > 82  BUN 29*  < > 16  CREATININE 1.70*  < > 1.61*  CALCIUM 8.5*  < > 9.0  AST 34  --   --   ALT 13*  --   --   ALKPHOS 87  --   --   BILITOT 0.4  --   --   < > = values in this interval not displayed. ------------------------------------------------------------------------------------------------------------------  Cardiac Enzymes  Recent Labs Lab 01/17/15 0947  TROPONINI <0.03   ------------------------------------------------------------------------------------------------------------------  RADIOLOGY:  Ct Head Wo Contrast  01/17/2015  CLINICAL DATA:  Fall  EXAM: CT HEAD WITHOUT CONTRAST  CT CERVICAL SPINE WITHOUT CONTRAST  TECHNIQUE: Multidetector CT imaging of the head and cervical spine was performed following the standard protocol without intravenous contrast. Multiplanar CT image reconstructions of the  cervical spine were also generated.  COMPARISON:  11/02/2012  FINDINGS: CT HEAD FINDINGS  No skull fracture is noted. No intracranial hemorrhage, mass effect or midline shift. No acute cortical infarction. Paranasal sinuses and mastoid air cells are unremarkable. No hydrocephalus. The gray and white-matter differentiation is preserved. No mass lesion is noted on this unenhanced scan. Ventricular size is stable from prior exam.  CT CERVICAL SPINE FINDINGS  Axial images of the cervical spine shows no acute fracture or subluxation. Computer processed images shows no acute fracture or subluxation. Degenerative changes are noted C1-C2 articulation. There is disc space flattening with endplate sclerotic changes mild anterior and mild posterior spurring at C3-C4 and C5-C6 level. Disc space flattening with mild anterior spurring at C6-C7 level. No prevertebral soft tissue swelling. Cervical airway is patent. Mild posterior spurring at C6-C7 level.  There is no pneumothorax in visualized lung apices. 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 Multilevel facet degenerative changes are noted.  IMPRESSION: 1. No acute intracranial abnormality.  No significant change. 2. No cervical spine acute fracture or subluxation. Degenerative changes as described above.   Electronically Signed   By: Lahoma Crocker M.D.   On: 01/17/2015 11:41   Ct Cervical Spine Wo Contrast  01/17/2015   CLINICAL DATA:  Fall  EXAM: CT HEAD WITHOUT CONTRAST  CT CERVICAL SPINE WITHOUT CONTRAST  TECHNIQUE: Multidetector CT imaging of the head and cervical spine was performed following the standard protocol without intravenous contrast. Multiplanar CT image reconstructions of the cervical spine were also generated.  COMPARISON:  11/02/2012  FINDINGS: CT HEAD FINDINGS  No skull fracture is noted. No intracranial hemorrhage, mass effect or midline shift. No acute cortical infarction. Paranasal sinuses and mastoid air cells are  unremarkable. No hydrocephalus. The gray and white-matter differentiation is preserved. No mass lesion is noted on this unenhanced scan. Ventricular size is stable from prior exam.  CT CERVICAL SPINE FINDINGS  Axial images of the cervical spine shows no acute fracture or subluxation. Computer processed images shows no acute fracture or subluxation. Degenerative changes are noted C1-C2 articulation. There is disc space flattening with endplate sclerotic changes mild anterior and mild posterior spurring at C3-C4 and C5-C6 level. Disc space flattening with mild anterior spurring at C6-C7 level. No prevertebral soft tissue swelling. Cervical airway is patent. Mild posterior spurring at C6-C7 level.  There is no pneumothorax in visualized lung apices. 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 Multilevel facet degenerative changes are noted.  IMPRESSION: 1. No acute intracranial abnormality.  No significant change. 2. No cervical spine acute fracture or subluxation. Degenerative changes as described above.   Electronically Signed   By: Lahoma Crocker M.D.   On: 01/17/2015 11:41   Ct Pelvis Wo Contrast  01/17/2015   CLINICAL DATA:  Probable urinary bladder stone  EXAM: CT PELVIS WITHOUT CONTRAST  TECHNIQUE: Multidetector CT imaging of the pelvis was performed following the standard protocol without intravenous contrast.  COMPARISON:  11/06/2012  FINDINGS: Degenerative changes are noted bilateral SI joints. Mild degenerative changes lower lumbar spine. Atherosclerotic calcifications of abdominal aorta and iliac arteries. No small bowel obstruction. Abundant stool and gas noted within rectum which is distended measures about 7.2 cm  in diameter suspicious for mild fecal impaction. No thickened or dilated small bowel loop. No ascites or free air. There is a urinary bladder/penile catheter. There is a calcified calculus within bladder at the tip of catheter measures 6 mm. Significant thickening of  urinary bladder wall highly suspicious for cystitis. There is no inguinal adenopathy. Pelvic phleboliths are again noted.  IMPRESSION: 1. There is urinary bladder catheter. There is a calcified bladder calculus at the tip of catheter measures 6 mm. 2. Significant thickening of the wall of urinary bladder highly suspicious for cystitis or chronic inflammation. 3. Abundant stool and gas noted within rectum measures 7.2 cm in diameter. Highly suspicious for mild fecal impaction. 4. No inguinal adenopathy.   Electronically Signed   By: Lahoma Crocker M.D.   On: 01/17/2015 13:38    EKG:   Orders placed or performed during the hospital encounter of 01/17/15  . ED EKG  . ED EKG    ASSESSMENT AND PLAN:  1. Hypoglycemia hypothermia secondary to severe sepsis: Patient temperature improved , urine cultures showing multiple species suggesting the recollection. So we will order the repeat urine cultures. 2.Sepsis secondary to UTI, catheter related UTI. Patient to seen by urology., Currently replaced yesterday. Follow the urine cultures. Patient advised to follow up with [facility] urology. Has history of neurogenic bladder. Continue IV antibiotics. 3.Altered mental status secondary to hypoglycemia and hypothermia patient to mental status is improved. . 4.h/o hypothyroidism; continue synthroid 5.copd; continue inhalers #6 acute renal failure and CK distress 3 renal failure improving with Fluids. #7 History of schizophrenia continue home medications  Hypoglycemia persistent without history of diabetes: Check a insulin level and C-peptide levels, endocrinology consult probably secondary to chronic illness and malnutrition. #9: Polyuria likely secondary to decreased by mouth intake with lots of fluids. But the way to exclude diabetes insipidus.   All the records are reviewed and case discussed with Care Management/Social Workerr. Management plans discussed with the patient, family and they are in agreement.  CODE  STATUS: full  TOTAL TIME TAKING CARE OF THIS PATIENT: 35 minutes.   POSSIBLE D/C IN 1-2DAYS, DEPENDING ON CLINICAL CONDITION.   Epifanio Lesches M.D on 01/19/2015 at 11:08 AM  Between 7am to 6pm - Pager - [phone]  After 6pm go to [email or web address] - password [email or web address]  Office  [phone]  CC: Primary care physician; [name], MD

## 2015-01-19 NOTE — Progress Notes (Signed)
Dr. Vianne Bulls notified pt c/o irritation and soreness in mouth. Verbal order read back & verified for Nystatin oral suspension. Will continue to assess.

## 2015-01-19 NOTE — Progress Notes (Signed)
Patient ID: SHAUNTEZ CONSTANTE, male   DOB: [birth year], 59 y.o.   MRN: [id number] Urology Consult Follow Up  Subjective: Mr. Shiflet is a 59yo male with a history of multiple chronic health issues including CKD stage III, MI, seizures, stroke and nephrolithiasis. Admitted for clinical sepsis related to UTI after being found unresponisve and hypoglycemic at group.  Urology was consult at to replace indwelling Foley catheter that appeared to have been in place for some time. Catheter was changed. Patient continues to improve on IV antibiotics. Patient is very well appearing, sitting up in bed eating breakfast denies any complaints this morning. Anti-infectives: Anti-infectives    Start     Dose/Rate Route Frequency Ordered Stop   01/17/15 1215  cefTRIAXone (ROCEPHIN) 1 g in dextrose 5 % 50 mL IVPB     1 g 100 mL/hr over 30 Minutes Intravenous Every 24 hours 01/17/15 1207     01/17/15 1024  piperacillin-tazobactam (ZOSYN) 3.375 (3-0.375) G injection    Comments:  STEIN BROOMER OLIVIA: cabinet override      01/17/15 1024 01/17/15 1024   01/17/15 1015  vancomycin (VANCOCIN) IVPB 1000 mg/200 mL premix     1,000 mg 200 mL/hr over 60 Minutes Intravenous  Once 01/17/15 1012 01/17/15 1125   01/17/15 1015  piperacillin-tazobactam (ZOSYN) IVPB 3.375 g     3.375 g 100 mL/hr over 30 Minutes Intravenous  Once 01/17/15 1012 01/17/15 1054      Current Facility-Administered Medications  Medication Dose Route Frequency Provider Last Rate Last Dose  . acetaminophen (TYLENOL) tablet 650 mg  650 mg Oral Q6H PRN Hillary Bow, MD   650 mg at 01/19/15 [id number]   Or  . acetaminophen (TYLENOL) suppository 650 mg  650 mg Rectal Q6H PRN Srikar Sudini, MD      . albuterol (PROVENTIL) (2.5 MG/3ML) 0.083% nebulizer solution 2.5 mg  2.5 mg Nebulization Q2H PRN Srikar Sudini, MD      . bisacodyl (DULCOLAX) suppository 10 mg  10 mg Rectal Daily PRN Srikar Sudini, MD      . cefTRIAXone (ROCEPHIN) 1 g in dextrose 5 % 50 mL IVPB  1  g Intravenous Q24H Hillary Bow, MD   1 g at 01/18/15 1138  . darifenacin (ENABLEX) 24 hr tablet 7.5 mg  7.5 mg Oral Daily Srikar Sudini, MD   7.5 mg at 01/19/15 0746  . dextrose 10 % infusion   Intravenous Continuous Epifanio Lesches, MD 40 mL/hr at 01/19/15 0813    . diphenhydrAMINE (BENADRYL) capsule 50 mg  50 mg Oral QHS Hillary Bow, MD   50 mg at 01/18/15 2011  . docusate sodium (COLACE) capsule 100 mg  100 mg Oral BID Hillary Bow, MD   100 mg at 01/18/15 1105  . enoxaparin (LOVENOX) injection 30 mg  30 mg Subcutaneous Q24H Hillary Bow, MD   30 mg at 01/17/15 2006  . FLUoxetine (PROZAC) capsule 40 mg  40 mg Oral Daily Srikar Sudini, MD   40 mg at 01/19/15 0746  . hydrocortisone (ANUSOL-HC) suppository 25 mg  25 mg Rectal TID PRN Hillary Bow, MD      . HYDROmorphone (DILAUDID) injection 2 mg  2 mg Intravenous Once Hillary Bow, MD   2 mg at 01/17/15 1530  . ipratropium-albuterol (DUONEB) 0.5-2.5 (3) MG/3ML nebulizer solution 3 mL  3 mL Nebulization Q6H PRN Srikar Sudini, MD      . levothyroxine (SYNTHROID, LEVOTHROID) tablet 50 mcg  50 mcg Oral QAC breakfast Hillary Bow, MD   50  mcg at 01/19/15 0746  . lidocaine (XYLOCAINE) 2 % jelly 1 application  1 application Urethral Once Roda Shutters, FNP   1 application at [id number]  . menthol-cetylpyridinium (CEPACOL) lozenge 3 mg  1 lozenge Oral PRN Hillary Bow, MD      . mometasone-formoterol (DULERA) 100-5 MCG/ACT inhaler 2 puff  2 puff Inhalation BID Hillary Bow, MD   2 puff at 01/19/15 0747  . morphine 4 MG/ML injection 4 mg  4 mg Intravenous Once PRN Srikar Sudini, MD      . ondansetron (ZOFRAN) tablet 4 mg  4 mg Oral Q6H PRN Hillary Bow, MD       Or  . ondansetron (ZOFRAN) injection 4 mg  4 mg Intravenous Q6H PRN Srikar Sudini, MD      . risperiDONE (RISPERDAL) tablet 0.5 mg  0.5 mg Oral QHS Srikar Sudini, MD   0.5 mg at 01/18/15 2010  . sodium bicarbonate tablet 650 mg  650 mg Oral BID Hillary Bow, MD   650 mg at  01/19/15 0746  . sodium chloride 0.9 % injection 3 mL  3 mL Intravenous Q12H Hillary Bow, MD   3 mL at 01/19/15 0747  . tamsulosin (FLOMAX) capsule 0.4 mg  0.4 mg Oral Daily Hillary Bow, MD   0.4 mg at 01/19/15 0746  . traMADol (ULTRAM) tablet 50 mg  50 mg Oral Q6H PRN Hillary Bow, MD   50 mg at 01/18/15 2346     Objective: Vital signs in last 24 hours: Temp:  [97.7 F (36.5 C)-98.7 F (37.1 C)] 98.6 F (37 C) (08/19 0515) Pulse Rate:  [71-83] 71 (08/19 0515) Resp:  [18-20] 18 (08/19 0515) BP: (138-165)/(80-92) 150/92 mmHg (08/19 0515) SpO2:  [98 %-100 %] 99 % (08/19 0515) Weight:  [156 lb (70.761 kg)] 156 lb (70.761 kg) (08/19 0515)  Intake/Output from previous day: 08/18 0701 - 08/19 0700 In: 480 [P.O.:480] Out: 8200 [Urine:8200] Intake/Output this shift: Total I/O In: -  Out: 1100 [Urine:1100]   Physical Exam  Constitutional: He is well-developed, well-nourished, and in no distress.  HENT:  Head: Normocephalic and atraumatic.  Eyes: Pupils are equal, round, and reactive to light.  Cardiovascular: Normal rate.   Pulmonary/Chest: Effort normal.  Genitourinary:  Foley in place draining clear yellow urine  Musculoskeletal: Normal range of motion.  Neurological: He is alert.  Skin: Skin is warm.  Psychiatric: Affect normal.    Lab Results:   Recent Labs  01/18/15 0432 01/19/15 0509  WBC 13.5* 10.6  HGB 12.4* 12.2*  HCT 37.4* 37.0*  PLT 157 152   BMET  Recent Labs  01/18/15 0432 01/19/15 0509  NA 139 142  K 3.9 4.5  CL 109 112*  CO2 24 26  GLUCOSE 100* 82  BUN 25* 16  CREATININE 1.65* 1.61*  CALCIUM 8.8* 9.0   PT/INR No results for input(s): LABPROT, INR in the last 72 hours. ABG No results for input(s): PHART, HCO3 in the last 72 hours.  Invalid input(s): PCO2, PO2  Studies/Results: Ct Head Wo Contrast  01/17/2015   CLINICAL DATA:  Fall  EXAM: CT HEAD WITHOUT CONTRAST  CT CERVICAL SPINE WITHOUT CONTRAST  TECHNIQUE: Multidetector CT  imaging of the head and cervical spine was performed following the standard protocol without intravenous contrast. Multiplanar CT image reconstructions of the cervical spine were also generated.  COMPARISON:  11/02/2012  FINDINGS: CT HEAD FINDINGS  No skull fracture is noted. No intracranial hemorrhage, mass effect or midline shift. No acute  cortical infarction. Paranasal sinuses and mastoid air cells are unremarkable. No hydrocephalus. The gray and white-matter differentiation is preserved. No mass lesion is noted on this unenhanced scan. Ventricular size is stable from prior exam.  CT CERVICAL SPINE FINDINGS  Axial images of the cervical spine shows no acute fracture or subluxation. Computer processed images shows no acute fracture or subluxation. Degenerative changes are noted C1-C2 articulation. There is disc space flattening with endplate sclerotic changes mild anterior and mild posterior spurring at C3-C4 and C5-C6 level. Disc space flattening with mild anterior spurring at C6-C7 level. No prevertebral soft tissue swelling. Cervical airway is patent. Mild posterior spurring at C6-C7 level.  There is no pneumothorax in visualized lung apices. 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 Multilevel facet degenerative changes are noted.  IMPRESSION: 1. No acute intracranial abnormality.  No significant change. 2. No cervical spine acute fracture or subluxation. Degenerative changes as described above.   Electronically Signed   By: Lahoma Crocker M.D.   On: 01/17/2015 11:41   Ct Cervical Spine Wo Contrast  01/17/2015   CLINICAL DATA:  Fall  EXAM: CT HEAD WITHOUT CONTRAST  CT CERVICAL SPINE WITHOUT CONTRAST  TECHNIQUE: Multidetector CT imaging of the head and cervical spine was performed following the standard protocol without intravenous contrast. Multiplanar CT image reconstructions of the cervical spine were also generated.  COMPARISON:  11/02/2012  FINDINGS: CT HEAD FINDINGS  No  skull fracture is noted. No intracranial hemorrhage, mass effect or midline shift. No acute cortical infarction. Paranasal sinuses and mastoid air cells are unremarkable. No hydrocephalus. The gray and white-matter differentiation is preserved. No mass lesion is noted on this unenhanced scan. Ventricular size is stable from prior exam.  CT CERVICAL SPINE FINDINGS  Axial images of the cervical spine shows no acute fracture or subluxation. Computer processed images shows no acute fracture or subluxation. Degenerative changes are noted C1-C2 articulation. There is disc space flattening with endplate sclerotic changes mild anterior and mild posterior spurring at C3-C4 and C5-C6 level. Disc space flattening with mild anterior spurring at C6-C7 level. No prevertebral soft tissue swelling. Cervical airway is patent. Mild posterior spurring at C6-C7 level.  There is no pneumothorax in visualized lung apices. 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 Multilevel facet degenerative changes are noted.  IMPRESSION: 1. No acute intracranial abnormality.  No significant change. 2. No cervical spine acute fracture or subluxation. Degenerative changes as described above.   Electronically Signed   By: Lahoma Crocker M.D.   On: 01/17/2015 11:41   Ct Pelvis Wo Contrast  01/17/2015   CLINICAL DATA:  Probable urinary bladder stone  EXAM: CT PELVIS WITHOUT CONTRAST  TECHNIQUE: Multidetector CT imaging of the pelvis was performed following the standard protocol without intravenous contrast.  COMPARISON:  11/06/2012  FINDINGS: Degenerative changes are noted bilateral SI joints. Mild degenerative changes lower lumbar spine. Atherosclerotic calcifications of abdominal aorta and iliac arteries. No small bowel obstruction. Abundant stool and gas noted within rectum which is distended measures about 7.2 cm in diameter suspicious for mild fecal impaction. No thickened or dilated small bowel loop. No ascites or free  air. There is a urinary bladder/penile catheter. There is a calcified calculus within bladder at the tip of catheter measures 6 mm. Significant thickening of urinary bladder wall highly suspicious for cystitis. There is no inguinal adenopathy. Pelvic phleboliths are again noted.  IMPRESSION: 1. There is urinary bladder catheter. There is a calcified  bladder calculus at the tip of catheter measures 6 mm. 2. Significant thickening of the wall of urinary bladder highly suspicious for cystitis or chronic inflammation. 3. Abundant stool and gas noted within rectum measures 7.2 cm in diameter. Highly suspicious for mild fecal impaction. 4. No inguinal adenopathy.   Electronically Signed   By: Lahoma Crocker M.D.   On: 01/17/2015 13:38   Dg Chest Port 1 View  01/17/2015   CLINICAL DATA:  Increase shortness of breath today post fall. Generalized weakness, sepsis.  EXAM: PORTABLE CHEST - 1 VIEW  COMPARISON:  12/05/2013  FINDINGS: The heart size and mediastinal contours are within normal limits. Both lungs are clear. The visualized skeletal structures are unremarkable.  IMPRESSION: No active disease.   Electronically Signed   By: Rolm Baptise M.D.   On: 01/17/2015 10:24     Assessment: Foley replaced successfully yesterday.  Urine clear in drainage bag today.  Patient doing well.   WBCs now within normal limits. Preliminary blood culture results showing no growth within 2 days.   Plan: D/C with Foley/antibiotics f/u with [facility]rology concerning management of  Chronic Foley and neurogenic bladder.Patient doing well from a urinary standpoint though his output is elevated and may need to be addressed by hospitalist. Nursing note in chart requesting patient to be seen concerning possible diabetes insipidus.  Please have patient follow up with [facility] urology after discharge.  Call office or Urologist on call with any questions or concerns while inpatient.  CC:     LOS: 2 days    Herbert Moors 01/19/2015    [facility] [address] [place], [place] [address] [phone]

## 2015-01-19 NOTE — Progress Notes (Signed)
   01/19/15 0945  Clinical Encounter Type  Visited With Patient  Visit Type Initial  Spiritual Encounters  Spiritual Needs Sacred text  Provided pastoral support and presence to patient.  Patient asked for a Bible which I will give to him today.  [place] [phone]

## 2015-01-19 NOTE — Plan of Care (Signed)
Problem: Discharge Progression Outcomes Goal: Other Discharge Outcomes/Goals Outcome: Progressing Plan of care progress to goal: Pain - pt has head and nose pain, but pain is not as severe as previous day.  Tramadol and tylenol coverage is sufficient BP elevated once during the night.   Complications - pt urine output in 20 hour period 7,352ml.  CBG's 77-100.  MD notified  Diet - drinking regular sodas and appetite good Activity - stand assist to [facility]

## 2015-01-19 NOTE — Progress Notes (Deleted)
Note documented in error

## 2015-01-19 NOTE — Consult Note (Signed)
ENDOCRINOLOGY CONSULTATION  REFERRING PHYSICIAN: Epifanio Lesches, MD. CONSULTING PHYSICIAN:  A. Lavone Orn, MD.  CHIEF COMPLAINT:  Hypoglycemia  HISTORY OF PRESENT ILLNESS:  59 y.o. male with h/o stage 3 CKD, indwelling Foley was admitted on 01/17/15 after episode of fall from bed and LOC at home. Subsequetnly diagnosed with urosepsis. Notes indicate EMS found FSBS to be low at 11. 25gm of Dextrose 50% was administered and BG improved to 130's. Last EMS check, BG reported to be 99. On arrival to ED, pt is A&Ox4, but slightly garbled speech with FSBS 64 and serum glucose 63. Orange juice given. He was subsequently started on D5 at 100 cc/hr. At 3 PM on day of admission a FSBS was 48. He was subsequently changed to D10 which he continues to receive at 40 cc/hr. Sugars today range 68 (at 7 AM) - 102. He reports feeling weak and lightheaded when his fasting sugar today was 68. Additionally, he is eating 50-75% of meals and is drinking regular soda between meals. He denies N/V/abd pain. Denies prior history of knowledge of hyper- or hypoglycemia. No personal or family h/o diabetes. No one in the home with diabetes and he denies access to blood sugar lowering medications such as insulin or sulfonylureas.   PAST MEDICAL HISTORY:  Stage 3 CKD Nephrolithiasis Indwelling Foley catheter Hypothyroidism CVA CAD s/p AMI (x2? Doesn't recall, 1st was in 1981) Tobacco dependence Peptic ulcer disease   CURRENT MEDICATIONS:  . cefTRIAXone (ROCEPHIN)  IV  1 g Intravenous Q24H  . darifenacin  7.5 mg Oral Daily  . diphenhydrAMINE  50 mg Oral QHS  . docusate sodium  100 mg Oral BID  . enoxaparin (LOVENOX) injection  40 mg Subcutaneous Q24H  . FLUoxetine  40 mg Oral Daily  .  HYDROmorphone (DILAUDID) injection  2 mg Intravenous Once  . levothyroxine  50 mcg Oral QAC breakfast  . lidocaine  1 application Urethral Once  . mometasone-formoterol  2 puff Inhalation BID  . nystatin  5 mL Oral QID  .  risperiDONE  0.5 mg Oral QHS  . sodium bicarbonate  650 mg Oral BID  . sodium chloride  3 mL Intravenous Q12H  . tamsulosin  0.4 mg Oral Daily     SOCIAL HISTORY:  Social History  Substance Use Topics  . Smoking status: Current Every Day Smoker -- 1.00 packs/day for 55 years    Types: Cigarettes  . Smokeless tobacco: Former Systems developer    Types: Chew  . Alcohol Use: No     FAMILY HISTORY:   Father had h/o CVA and lung cancer Mother had brain cancer No known diabetes  ALLERGIES:  Allergies  Allergen Reactions  . Mellaril [Thioridazine] Other (See Comments)    Unknown   . Morphine And Related Itching  . Navane [Thiothixene] Other (See Comments)    GI Distress  . Thorazine [Chlorpromazine] Other (See Comments)    Unknown    REVIEW OF SYSTEMS:  GENERAL:  No weight loss.  No fever.  HEENT:  No blurred vision. No sore throat.  NECK:  No neck pain or dysphagia.  CARDIAC:  No chest pain or palpitation.  PULMONARY:  No cough or shortness of breath.  ABDOMEN:  No abdominal pain.  No constipation. EXTREMITIES:  No lower extremity swelling.  ENDOCRINE:  No heat or cold intolerance.  SKIN:  No recent rash or skin changes.   PHYSICAL EXAMINATION:  BP 140/89 mmHg  Pulse 99  Temp(Src) 97.5 F (36.4 C) (Oral)  Resp  20  Ht 6' (1.829 m)  Wt 70.761 kg (156 lb)  BMI 21.15 kg/m2  SpO2 99%  GENERAL:  Well-developed white male in NAD. HEENT:  EOMI.  Oropharynx is clear.  NECK:  Supple.  No thyromegaly.  No neck tenderness.  CARDIAC:  Regular rate and rhythm without murmur.  PULMONARY:  Clear to auscultation bilaterally.  ABDOMEN:  Diffusely soft, nontender, nondistended.  EXTREMITIES:  No peripheral edema is present.    GU: Foley catheter in place. SKIN:  No rash or dermatopathy. NEUROLOGIC:  No dysarthria.  No tremor. PSYCHIATRIC:  Alert and oriented, calm, cooperative.   LABORATORY DATA:  Results for orders placed or performed during the hospital encounter of 01/17/15 (from  the past 24 hour(s))  Glucose, capillary     Status: Abnormal   Collection Time: 01/18/15  3:02 PM  Result Value Ref Range   Glucose-Capillary 104 (H) 65 - 99 mg/dL   Comment 1 Notify RN   Glucose, capillary     Status: Abnormal   Collection Time: 01/18/15  5:03 PM  Result Value Ref Range   Glucose-Capillary 123 (H) 65 - 99 mg/dL   Comment 1 Notify RN   Glucose, capillary     Status: Abnormal   Collection Time: 01/18/15  7:04 PM  Result Value Ref Range   Glucose-Capillary 127 (H) 65 - 99 mg/dL   Comment 1 Notify RN   Glucose, capillary     Status: Abnormal   Collection Time: 01/18/15  9:06 PM  Result Value Ref Range   Glucose-Capillary 116 (H) 65 - 99 mg/dL  Glucose, capillary     Status: Abnormal   Collection Time: 01/19/15 12:03 AM  Result Value Ref Range   Glucose-Capillary 100 (H) 65 - 99 mg/dL  Glucose, capillary     Status: None   Collection Time: 01/19/15  2:13 AM  Result Value Ref Range   Glucose-Capillary 77 65 - 99 mg/dL  Glucose, capillary     Status: None   Collection Time: 01/19/15  3:50 AM  Result Value Ref Range   Glucose-Capillary 91 65 - 99 mg/dL  CBC     Status: Abnormal   Collection Time: 01/19/15  5:09 AM  Result Value Ref Range   WBC 10.6 3.8 - 10.6 K/uL   RBC 4.02 (L) 4.40 - 5.90 MIL/uL   Hemoglobin 12.2 (L) 13.0 - 18.0 g/dL   HCT 37.0 (L) 40.0 - 52.0 %   MCV 92.1 80.0 - 100.0 fL   MCH 30.4 26.0 - 34.0 pg   MCHC 33.0 32.0 - 36.0 g/dL   RDW 15.8 (H) 11.5 - 14.5 %   Platelets 152 150 - 440 K/uL  Basic metabolic panel     Status: Abnormal   Collection Time: 01/19/15  5:09 AM  Result Value Ref Range   Sodium 142 135 - 145 mmol/L   Potassium 4.5 3.5 - 5.1 mmol/L   Chloride 112 (H) 101 - 111 mmol/L   CO2 26 22 - 32 mmol/L   Glucose, Bld 82 65 - 99 mg/dL   BUN 16 6 - 20 mg/dL   Creatinine, Ser 1.61 (H) 0.61 - 1.24 mg/dL   Calcium 9.0 8.9 - 10.3 mg/dL   GFR calc non Af Amer 45 (L) >60 mL/min   GFR calc Af Amer 52 (L) >60 mL/min   Anion gap 4 (L)  5 - 15  Glucose, capillary     Status: None   Collection Time: 01/19/15  5:57 AM  Result  Value Ref Range   Glucose-Capillary 90 65 - 99 mg/dL  Glucose, capillary     Status: None   Collection Time: 01/19/15  7:57 AM  Result Value Ref Range   Glucose-Capillary 68 65 - 99 mg/dL   Comment 1 Notify RN   Glucose, capillary     Status: None   Collection Time: 01/19/15  8:32 AM  Result Value Ref Range   Glucose-Capillary 89 65 - 99 mg/dL  Glucose, capillary     Status: Abnormal   Collection Time: 01/19/15 10:16 AM  Result Value Ref Range   Glucose-Capillary 102 (H) 65 - 99 mg/dL   Comment 1 Notify RN   Glucose, capillary     Status: None   Collection Time: 01/19/15 12:08 PM  Result Value Ref Range   Glucose-Capillary 98 65 - 99 mg/dL   Comment 1 Notify RN     ASSESSMENT:  Hypoglycemia in setting of urosepsis  PLAN: 1. Suspect hypoglycemia was due to underlying sepsis. No evidence of exposure to blood sugar lowering medications. No recent severe weight loss. While nutrition is not ideal (eating only 50-75% of meals), I do not suspect hypoglycemia is due to lack of nutrition. Most likely related to infection and this should improves as clinical course improves.  2. Recommend titrating down on the IV Dextrose gradually. Will recommend nursing to decrease rate by 10 cc/hr with each FSBS (q4 hrs). OK to stop Dextrose if after 4 hrs on the 10 cc/hr rate, his sugars remain >70.  If after this time he has sugars in the 50-70 range, attempt to treat these by following the HYPOGLYCEMIA PROTOCOL order set. 4. If FSBS do decrease to <70, then increase rate by 10 cc/hr. If unable to stop the IV dextrose in this manner over next 24-48 hrs, then he would warrant further work up for chronic hypoglycemia. In order to do this ideally you would stop the IV dextrose and wait for sugar to decrease to <55 and then obtain the following labs:  Glucose, insulin, c-peptide, betahydroxybutarate, and a sulfonylurea  panel.  5. Encouraged good PO nutrition.  I am not available over the upcoming weekend however will return on Monday 22Aug and can follow up with him at that time if still hospitalized.

## 2015-01-20 LAB — BASIC METABOLIC PANEL
Anion gap: 5 (ref 5–15)
BUN: 15 mg/dL (ref 6–20)
CHLORIDE: 108 mmol/L (ref 101–111)
CO2: 26 mmol/L (ref 22–32)
CREATININE: 1.67 mg/dL — AB (ref 0.61–1.24)
Calcium: 9.2 mg/dL (ref 8.9–10.3)
GFR calc Af Amer: 50 mL/min — ABNORMAL LOW (ref >60)
GFR calc non Af Amer: 43 mL/min — ABNORMAL LOW (ref >60)
GLUCOSE: 86 mg/dL (ref 65–99)
POTASSIUM: 4 mmol/L (ref 3.5–5.1)
Sodium: 139 mmol/L (ref 135–145)

## 2015-01-20 LAB — GLUCOSE, CAPILLARY
GLUCOSE-CAPILLARY: 100 mg/dL — AB (ref 65–99)
GLUCOSE-CAPILLARY: 101 mg/dL — AB (ref 65–99)
GLUCOSE-CAPILLARY: 117 mg/dL — AB (ref 65–99)
GLUCOSE-CAPILLARY: 82 mg/dL (ref 65–99)
GLUCOSE-CAPILLARY: 83 mg/dL (ref 65–99)
GLUCOSE-CAPILLARY: 87 mg/dL (ref 65–99)
GLUCOSE-CAPILLARY: 94 mg/dL (ref 65–99)
GLUCOSE-CAPILLARY: 95 mg/dL (ref 65–99)
GLUCOSE-CAPILLARY: 98 mg/dL (ref 65–99)
Glucose-Capillary: 110 mg/dL — ABNORMAL HIGH (ref 65–99)
Glucose-Capillary: 100 mg/dL — ABNORMAL HIGH (ref 65–99)
Glucose-Capillary: 109 mg/dL — ABNORMAL HIGH (ref 65–99)
Glucose-Capillary: 109 mg/dL — ABNORMAL HIGH (ref 65–99)

## 2015-01-20 LAB — INSULIN AND C-PEPTIDE, SERUM
C PEPTIDE: 2.4 ng/mL (ref 1.1–4.4)
INSULIN: 3.6 u[IU]/mL (ref 2.6–24.9)

## 2015-01-20 NOTE — Plan of Care (Signed)
Problem: Discharge Progression Outcomes Goal: Other Discharge Outcomes/Goals Outcome: Progressing Plan of care progress to goals: 1. C/o headache relieved by Tramadol  2. Hemodynamically:              -VSS, afebrile, IV abx given as scheduled             -D10 IVF infusing, titrating Q4H depending on blood sugar results. Tolerating well so far.             -Nystatin given for mouth irritation, pt states feeling better today 3. Tolerating regular diet well, good appetite.    4. +1 standby assist for safety. Bed alarm on. Pt understands how to use call system for assistance

## 2015-01-20 NOTE — Plan of Care (Signed)
Problem: Discharge Progression Outcomes Goal: Other Discharge Outcomes/Goals Outcome: Progressing 1. headache. Tramadol effectively relieved pain. 2. Hemodynamically:              -VSS, afebrile, D10 infusing, IV abx             -blood sugars remain stable,              3. Tolerating regular diet well, good appetite.    4. +1 standby assist for safety. Bed alarm on. Pt understands how to use call system for assistance

## 2015-01-20 NOTE — Progress Notes (Signed)
D10 IVF reduced to 59mL/hr per order at 0900AM, will reassess in 4 hours if IVF can be reduced again.

## 2015-01-20 NOTE — Progress Notes (Signed)
[facility] NAME: Alan Small    MR#:  [id number]  DATE OF BIRTH:  02/04/1956  Seen today, patient denies any complaints. The glucose is above 80 . D10 at 30 cc an hour.   CHIEF COMPLAINT:   Chief Complaint  Patient presents with  . Hypoglycemia    REVIEW OF SYSTEMS:   ROS CONSTITUTIONAL: No fever, fatigue or weakness.  EYES: No blurred or double vision.  EARS, NOSE, AND THROAT: No tinnitus or ear pain.  RESPIRATORY: No cough, shortness of breath, wheezing or hemoptysis.  CARDIOVASCULAR: No chest pain, orthopnea, edema.  GASTROINTESTINAL: No nausea, vomiting, diarrhea or abdominal pain.  GENITOURINARY: No dysuria, hematuria.  ENDOCRINE: No polyuria, nocturia,  HEMATOLOGY: No anemia, easy bruising or bleeding SKIN: No rash or lesion. MUSCULOSKELETAL: No joint pain or arthritis.   NEUROLOGIC: No tingling, numbness, weakness.  PSYCHIATRY: No anxiety or depression.   DRUG ALLERGIES:   Allergies  Allergen Reactions  . Mellaril [Thioridazine] Other (See Comments)    Unknown   . Morphine And Related Itching  . Navane [Thiothixene] Other (See Comments)    GI Distress  . Thorazine [Chlorpromazine] Other (See Comments)    Unknown    VITALS:  Blood pressure 152/92, pulse 68, temperature 97.4 F (36.3 C), temperature source Oral, resp. rate 18, height 6' (1.829 m), weight 69.718 kg (153 lb 11.2 oz), SpO2 99 %.  PHYSICAL EXAMINATION:  GENERAL:  59 y.o.-year-old patient lying in the bed with no acute distress.  EYES: Pupils equal, round, reactive to light and accommodation. No scleral icterus. Extraocular muscles intact.  HEENT: Head atraumatic, normocephalic. Oropharynx and nasopharynx clear.  NECK:  Supple, no jugular venous distention. No thyroid enlargement, no tenderness.  LUNGS: Normal breath sounds bilaterally, no wheezing, rales,rhonchi or crepitation. No use of accessory muscles of respiration.   CARDIOVASCULAR: S1, S2 normal. No murmurs, rubs, or gallops.  ABDOMEN: Soft, nontender, nondistended. Bowel sounds present. No organomegaly or mass.  EXTREMITIES: No pedal edema, cyanosis, or clubbing.  NEUROLOGIC: Cranial nerves II through XII are intact. Muscle strength 5/5 in all extremities. Sensation intact. Gait not checked.  PSYCHIATRIC: The patient is alert and oriented x 3.  SKIN: No obvious rash, lesion, or ulcer.    LABORATORY PANEL:   CBC  Recent Labs Lab 01/19/15 0509  WBC 10.6  HGB 12.2*  HCT 37.0*  PLT 152   ------------------------------------------------------------------------------------------------------------------  Chemistries   Recent Labs Lab 01/17/15 0947  01/20/15 0431  NA 127*  < > 139  K 4.5  < > 4.0  CL 96*  < > 108  CO2 23  < > 26  GLUCOSE 63*  < > 86  BUN 29*  < > 15  CREATININE 1.70*  < > 1.67*  CALCIUM 8.5*  < > 9.2  AST 34  --   --   ALT 13*  --   --   ALKPHOS 87  --   --   BILITOT 0.4  --   --   < > = values in this interval not displayed. ------------------------------------------------------------------------------------------------------------------  Cardiac Enzymes  Recent Labs Lab 01/17/15 0947  TROPONINI <0.03   ------------------------------------------------------------------------------------------------------------------  RADIOLOGY:  No results found.  EKG:   Orders placed or performed during the hospital encounter of 01/17/15  . ED EKG  . ED EKG    ASSESSMENT AND PLAN:  1. Hypoglycemia hypothermia secondary to severe sepsis: Patient temperature improved , urine cultures showing multiple species suggesting  the recollection. So we will order the repeat urine cultures. Hypoglycemia hypothermia thought to be secondary to severe sepsis. Symptoms of hypoglycemia hypothermia are improving seen by endocrinologist Dr. Gabriel Carina  she suggested.to decreased 10 cc/hr 2.Sepsis secondary to UTI, catheter related UTI.  Patient to seen by urology., Currently replaced Foley yesterday. Follow the urine cultures. Patient advised to follow up with [facility] urology. Has history of neurogenic bladder. Continue IV antibiotics.urine cultures to be repeated,reordered 3.Altered mental status secondary to hypoglycemia and hypothermia patient to mental status is improved. . 4.h/o hypothyroidism; continue synthroid 5.copd; continue inhalers #6 acute renal failure and CK disease 3 renal failure improving with Fluids. #7 History of schizophrenia continue home medications  Hypoglycemia persistent without history of diabetes: Check a insulin level and C-peptide levels, endocrinology consult probably secondary to chronic illness and malnutrition. #9: Polyuria likely secondary to decreased by mouth intake with lots of fluids.  Deconditioning;PT eval today   All the records are reviewed and case discussed with Care Management/Social Workerr. Management plans discussed with the patient, family and they are in agreement.  CODE STATUS: full  TOTAL TIME TAKING CARE OF THIS PATIENT: 35 minutes.   POSSIBLE D/C IN 1-2DAYS, DEPENDING ON CLINICAL CONDITION.   Epifanio Lesches M.D on 01/20/2015 at 8:55 AM  Between 7am to 6pm - Pager - [phone]  After 6pm go to [email or web address] - password [email or web address] [facility]  Office  [phone]  CC: Primary care physician; [name], MD

## 2015-01-20 NOTE — Plan of Care (Signed)
Problem: Acute Rehab PT Goals(only PT should resolve) Goal: Patient Will Transfer Sit To/From Stand Pt will transfer sit to/from-stand with SPC at sup without loss-of-balance to demonstrate good safety awareness for independent mobility in home.     Goal: Pt Will Ambulate Pt will ambulate with SPC at Supervision using a step-through pattern and equal step length for a distances greater than 222ft to demonstrate the ability to perform safe household distance ambulation at discharge.

## 2015-01-20 NOTE — Progress Notes (Signed)
D10 IVF decreased to 40mL/hr at 1400. Blood sugar at 1355 was 109. Will reassess in 4 hours.

## 2015-01-20 NOTE — Evaluation (Signed)
Physical Therapy Evaluation Patient Details Name: Alan Small MRN: [id number] DOB: 12-08-1955 Today's Date: 01/20/2015   History of Present Illness  Pt is a 59yo white male who sustained a fall while at home and arrived, found to have UTI.   Clinical Impression  Pt is received semirecumbent in bed upon entry, awake, alert, and willing to participate. No acute distress noted.  Pt is A&Ox3 and pleasant. Pt reports multiple falls in the last 6 months, reporting that he has a constant light headed feeling at baseline that is insidious. Pt strength as screened during functional mobility assessment demonstrates mild to moderate weakness with transfers and ambulation. Pt falls risk is high as evidenced by slow gait speed, poor forward reach, and altered posturing during transfers and mobility. HR and SaO2 are unremarkable throughout evaluation. Patient presenting with impairment of strength, balance, and activity tolerance, limiting ability to perform ADL and mobility tasks at  baseline level of function. Patient will benefit from skilled intervention to address the above impairments and limitations, in order to restore to prior level of function, improve patient safety upon discharge, and to decrease falls risk.       Follow Up Recommendations Home health PT    Equipment Recommendations       Recommendations for Other Services       Precautions / Restrictions Precautions Precautions: Fall Restrictions Weight Bearing Restrictions: No      Mobility  Bed Mobility Overal bed mobility: Needs Assistance Bed Mobility: Supine to Sit     Supine to sit: Supervision        Transfers Overall transfer level: Needs assistance Equipment used: Rolling walker (2 wheeled) Transfers: Sit to/from Stand Sit to Stand: Min guard            Ambulation/Gait Ambulation/Gait assistance: Min guard Ambulation Distance (Feet): 100 Feet Assistive device: Rolling walker (2 wheeled) Gait  Pattern/deviations: Drifts right/left   Gait velocity interpretation: <1.8 ft/sec, indicative of risk for recurrent falls General Gait Details: Pt reports he feels a 'head rush while walking, big eyed and spacey.' Does not improve.   Stairs            Wheelchair Mobility    Modified Rankin (Stroke Patients Only)       Balance Overall balance assessment: Needs assistance Sitting-balance support: Feet supported;No upper extremity supported Sitting balance-Leahy Scale: Good     Standing balance support: Single extremity supported;During functional activity Standing balance-Leahy Scale: Fair                               Pertinent Vitals/Pain Pain Assessment: 0-10 Pain Location: does not rate, reports pain on L flank near sore on skin, as pain in legs related to fall recently sustained.  Pain Intervention(s): Limited activity within patient's tolerance;Monitored during session;Repositioned    Home Living Family/patient expects to be discharged to:: Group home Living Arrangements: Group Home Available Help at Discharge: Friend(s);Personal care attendant;Available PRN/intermittently Type of Home: Group Home Home Access: Ramped entrance;Stairs to enter Entrance Stairs-Rails: Right   Home Layout: One level Home Equipment: Cane - single point      Prior Function Level of Independence: Needs assistance   Gait / Transfers Assistance Needed: Indep; SPC for gait, reports frequent falls.   ADL's / Homemaking Assistance Needed: assistance with meals, laundry, bathing        Hand Dominance   Dominant Hand: Right    Extremity/Trunk Assessment   Upper  Extremity Assessment: Overall WFL for tasks assessed           Lower Extremity Assessment: Generalized weakness      Cervical / Trunk Assessment: Normal  Communication   Communication: No difficulties  Cognition Arousal/Alertness: Awake/alert Behavior During Therapy: WFL for tasks  assessed/performed Overall Cognitive Status: History of cognitive impairments - at baseline                      General Comments      Exercises        Assessment/Plan    PT Assessment Patient needs continued PT services  PT Diagnosis Difficulty walking;Abnormality of gait;Generalized weakness;Altered mental status   PT Problem List Decreased strength;Decreased activity tolerance;Decreased balance;Decreased mobility;Decreased coordination  PT Treatment Interventions DME instruction;Gait training;Stair training;Functional mobility training;Therapeutic activities;Balance training;Therapeutic exercise;Patient/family education   PT Goals (Current goals can be found in the Care Plan section) Acute Rehab PT Goals Patient Stated Goal: Improve constant dizziness sensation, reduce falls  PT Goal Formulation: With patient Time For Goal Achievement: 02/03/15 Potential to Achieve Goals: Fair    Frequency Min 2X/week   Barriers to discharge        Co-evaluation               End of Session Equipment Utilized During Treatment: Gait belt Activity Tolerance: Patient tolerated treatment well Patient left: in chair;with call bell/phone within reach;with chair alarm set Nurse Communication: Mobility status;Other (comment)         [id number] PT Time Calculation (min) (ACUTE ONLY): 18 min   Charges:   PT Evaluation $Initial PT Evaluation Tier I: 1 Procedure     PT G Codes:        Brannen Koppen C 01/23/2015, 5:05 PM 5:08 PM  Etta Grandchild, PT, DPT [place] License # [id number]

## 2015-01-21 LAB — GLUCOSE, CAPILLARY
GLUCOSE-CAPILLARY: 131 mg/dL — AB (ref 65–99)
GLUCOSE-CAPILLARY: 65 mg/dL (ref 65–99)
GLUCOSE-CAPILLARY: 140 mg/dL — AB (ref 65–99)
GLUCOSE-CAPILLARY: 103 mg/dL — AB (ref 65–99)
GLUCOSE-CAPILLARY: 126 mg/dL — AB (ref 65–99)
GLUCOSE-CAPILLARY: 121 mg/dL — AB (ref 65–99)
Glucose-Capillary: 108 mg/dL — ABNORMAL HIGH (ref 65–99)
Glucose-Capillary: 132 mg/dL — ABNORMAL HIGH (ref 65–99)
Glucose-Capillary: 102 mg/dL — ABNORMAL HIGH (ref 65–99)
Glucose-Capillary: 144 mg/dL — ABNORMAL HIGH (ref 65–99)
Glucose-Capillary: 138 mg/dL — ABNORMAL HIGH (ref 65–99)

## 2015-01-21 NOTE — Plan of Care (Signed)
Problem: Discharge Progression Outcomes Goal: Other Discharge Outcomes/Goals Outcome: Progressing Plan of care progress to goals: 1. C/o headache relieved by Tramadol   2. Hemodynamically:              -VSS, afebrile             -D10 IVF discontinued per order prior to start of my shift, patients blood sugars have remianed above 90   3. Tolerating regular diet well, good appetite.     4. +1 standby assist for safety. Bed alarm on. Pt understands how to use call system for assistance

## 2015-01-21 NOTE — Progress Notes (Signed)
[place] [facility] [place] NAME: Alan Small    MR#:  [id number]  DATE OF BIRTH:  [birth year]  Complains of headache but sugar was 65 this morning. Asymptomatic otherwise.   CHIEF COMPLAINT:   Chief Complaint  Patient presents with  . Hypoglycemia    REVIEW OF SYSTEMS:   Review of Systems  Constitutional: Negative for fever and chills.  HENT: Negative for hearing loss.   Eyes: Negative for blurred vision, double vision and photophobia.  Respiratory: Negative for cough, hemoptysis and shortness of breath.   Cardiovascular: Negative for palpitations, orthopnea and leg swelling.  Gastrointestinal: Negative for vomiting, abdominal pain and diarrhea.  Genitourinary: Negative for dysuria and urgency.  Musculoskeletal: Negative for myalgias and neck pain.  Skin: Negative for rash.  Neurological: Positive for headaches. Negative for dizziness, focal weakness, seizures and weakness.  Psychiatric/Behavioral: Negative for memory loss. The patient does not have insomnia.      DRUG ALLERGIES:   Allergies  Allergen Reactions  . Mellaril [Thioridazine] Other (See Comments)    Unknown   . Morphine And Related Itching  . Navane [Thiothixene] Other (See Comments)    GI Distress  . Thorazine [Chlorpromazine] Other (See Comments)    Unknown    VITALS:  Blood pressure 134/89, pulse 74, temperature 98 F (36.7 C), temperature source Oral, resp. rate 18, height 6' (1.829 m), weight 67.314 kg (148 lb 6.4 oz), SpO2 99 %.  PHYSICAL EXAMINATION:  GENERAL:  59 y.o.-year-old patient lying in the bed with no acute distress.  EYES: Pupils equal, round, reactive to light and accommodation. No scleral icterus. Extraocular muscles intact.  HEENT: Head atraumatic, normocephalic. Oropharynx and nasopharynx clear.  NECK:  Supple, no jugular venous distention. No thyroid enlargement, no tenderness.  LUNGS: Normal breath sounds bilaterally, no wheezing,  rales,rhonchi or crepitation. No use of accessory muscles of respiration.  CARDIOVASCULAR: S1, S2 normal. No murmurs, rubs, or gallops.  ABDOMEN: Soft, nontender, nondistended. Bowel sounds present. No organomegaly or mass.  EXTREMITIES: No pedal edema, cyanosis, or clubbing.  NEUROLOGIC: Cranial nerves II through XII are intact. Muscle strength 5/5 in all extremities. Sensation intact. Gait not checked.  PSYCHIATRIC: The patient is alert and oriented x 3.  SKIN: No obvious rash, lesion, or ulcer.    LABORATORY PANEL:   CBC  Recent Labs Lab 01/19/15 0509  WBC 10.6  HGB 12.2*  HCT 37.0*  PLT 152   ------------------------------------------------------------------------------------------------------------------  Chemistries   Recent Labs Lab 01/17/15 0947  01/20/15 0431  NA 127*  < > 139  K 4.5  < > 4.0  CL 96*  < > 108  CO2 23  < > 26  GLUCOSE 63*  < > 86  BUN 29*  < > 15  CREATININE 1.70*  < > 1.67*  CALCIUM 8.5*  < > 9.2  AST 34  --   --   ALT 13*  --   --   ALKPHOS 87  --   --   BILITOT 0.4  --   --   < > = values in this interval not displayed. ------------------------------------------------------------------------------------------------------------------  Cardiac Enzymes  Recent Labs Lab 01/17/15 0947  TROPONINI <0.03   ------------------------------------------------------------------------------------------------------------------  RADIOLOGY:  No results found.  EKG:   Orders placed or performed during the hospital encounter of 01/17/15  . ED EKG  . ED EKG    ASSESSMENT AND PLAN:  1. Hypoglycemia hypothermia secondary to severe sepsis: Patient temperature improved , urine  cultures showing multiple species suggesting the recollection. So we will order the repeat urine cultures. Hypoglycemia hypothermia thought to be secondary to severe sepsis. Rpt cultures ordered,  2.Sepsis secondary to UTI, catheter related UTI. Patient to seen by urology.,  Currently replaced Foley yesterday. Follow the urine cultures. Patient advised to follow up with [facility] urology. Has history of neurogenic bladder. Continue IV antibiotics.urine cultures to be repeated,reordered 3.Altered mental status secondary to hypoglycemia and hypothermia patient to mental status is improved. . 4.h/o hypothyroidism; continue synthroid 5.copd; continue inhalers, #6 acute renal failure and CK disease 3 renal failure improved  with Fluids. #7 History of schizophrenia continue home medications  Hypoglycemia persistent without history of diabetes: Check a insulin level and C-peptide levels, endocrinology consult probably secondary to chronic illness and malnutrition. #9: Polyuria likely secondary to decreased by mouth intake with lots of fluids.  Deconditioning;PT eval today   All the records are reviewed and case discussed with Care Management/Social Workerr. Management plans discussed with the patient, family and they are in agreement.  CODE STATUS: full  TOTAL TIME TAKING CARE OF THIS PATIENT: 35 minutes.   POSSIBLE D/C IN 1-2DAYS, DEPENDING ON CLINICAL CONDITION.   Epifanio Lesches M.D on 01/21/2015 at 9:13 AM  Between 7am to 6pm - Pager - [phone]  After 6pm go to [email or web address] - password [email or web address] [place]  Office  [phone]  CC: Primary care physician; [name], MD

## 2015-01-22 LAB — CULTURE, BLOOD (ROUTINE X 2)
Culture: NO GROWTH
Culture: NO GROWTH

## 2015-01-22 LAB — GLUCOSE, CAPILLARY
GLUCOSE-CAPILLARY: 159 mg/dL — AB (ref 65–99)
Glucose-Capillary: 94 mg/dL (ref 65–99)
Glucose-Capillary: 101 mg/dL — ABNORMAL HIGH (ref 65–99)
Glucose-Capillary: 88 mg/dL (ref 65–99)
Glucose-Capillary: 98 mg/dL (ref 65–99)
Glucose-Capillary: 143 mg/dL — ABNORMAL HIGH (ref 65–99)

## 2015-01-22 LAB — URINE CULTURE: Culture: NO GROWTH

## 2015-01-22 MED ORDER — NYSTATIN 100000 UNIT/ML MT SUSP: 5.0000 mL | Freq: Four times a day (QID) | OROMUCOSAL | Status: DC

## 2015-01-22 MED ORDER — CEPHALEXIN 500 MG PO CAPS: 500.0000 mg | ORAL_CAPSULE | Freq: Four times a day (QID) | ORAL | Status: DC

## 2015-01-22 NOTE — Care Management Important Message (Signed)
Important Message  Patient Details  Name: SAMUELE SUTCLIFFE MRN: [id number] Date of Birth: 02/19/1956   Medicare Important Message Given:  Yes-third notification given    Darius Bump Allmond 01/22/2015, 9:54 AM

## 2015-01-22 NOTE — Progress Notes (Signed)
Patient discharged to group home per MD orders. VSS. All discharge instructions given and all questions answered. Escorted by group home representative.

## 2015-01-22 NOTE — Progress Notes (Signed)
LCSW completed FL2  and spoke to group home provider Moshe Salisbury [phone] she will pick up patient at 12:30pm  LCSW will fax over signed Fl2 to [facility] at [phone]  Paged Dr Vianne Bulls.Needs her to sign off on FL2

## 2015-01-22 NOTE — Discharge Summary (Signed)
Alan Small, is a 59 y.o. male  DOB 02/10/1956  MRN [id number].  Admission date:  01/17/2015  Admitting Physician  Hillary Bow, MD  Discharge Date:  01/22/2015   Primary MD  [name], MD  Recommendations for primary care physician for things to follow:  Follow-up with primary doctor, urologist in 1 week.   Admission Diagnosis  Weakness [R53.1] SOB (shortness of breath) [R06.02] Hypoglycemia [E16.2] Fall [W19.XXXA] Sepsis secondary to UTI [A41.9, N39.0] Hypothermia, initial encounter [T68.XXXA] Sepsis [A41.9]   Discharge Diagnosis  Weakness [R53.1] SOB (shortness of breath) [R06.02] Hypoglycemia [E16.2] Fall [W19.XXXA] Sepsis secondary to UTI [A41.9, N39.0] Hypothermia, initial encounter [T68.XXXA] Sepsis [A41.9]    Active Problems:   Acute renal failure   Severe sepsis   Encephalopathy acute   UTI (lower urinary tract infection)   Hypoglycemia      Past Medical History  Diagnosis Date  . Mental disorder   . Hypothyroidism   . Seizures   . Myocardial infarction   . Stroke   . Asthma     Past Surgical History  Procedure Laterality Date  . Appendectomy    . Colonoscopy with propofol N/A 12/07/2014    Procedure: COLONOSCOPY WITH PROPOFOL;  Surgeon: Manya Silvas, MD;  Location: [facility] ENDOSCOPY;  Service: Endoscopy;  Laterality: N/A;  . Esophagogastroduodenoscopy N/A 12/07/2014    Procedure: ESOPHAGOGASTRODUODENOSCOPY (EGD);  Surgeon: Manya Silvas, MD;  Location: [facility] ENDOSCOPY;  Service: Endoscopy;  Laterality: N/A;  . Nephrectomy         History of present illness and  Hospital Course:     Kindly see H&P for history of present illness and admission details, please review complete Labs, Consult reports and Test reports for all details in brief  HPI  from the history and  physical done on the day of admission  59 year old MALE with history of CK disease 3, nephro lithiasis admitted from group home because of altered mental status, hypothermia., Hypoglycemia  Hospital Course   1 altered mental status with metabolic encephalopathy secondary to hypoglycemia and hypothermia, sepsis. Mental status improved head CT unremarkable patient to received IV fluids, bed hugger. Admitted for severe sepsis. #2 hypothermia patient temperature was 94.4 rectally on admission. Improved To 97.7. 3.Hypoglycemia;not diabetic. One initial blood sugar was 11, patient was unresponsive found on the floor. Patient blood glucose upon EMS arrival was 11. Patient received D50 one amp and improved to 1:30.  patient blood sugar dropped again to 64. The patient admitted for hypoglycemia also received IV fluids with D5 half N saline to D10 water secondary to persistent hypoglycemia. Patient is seen by endocrinologist Dr. Dyke Brackett. She is recommended hypoglycemia secondary to underlying sepsis. She is recommended to wean off the dextrose in IV did slowly, patient to insulin C-peptide sulfonylurea panels are ordered. Insulin, C-peptide are  in normal range.  4, sepsis and agree to UTI, catheter related UTI. Seen by urology, Foley replaced., Patient has history of neurogenic bladder. Catheter balloon was flushed. Abdominal CAT scan showed calcification at the tip of catheter balloon measuring at 6 mm. He is a patient of Dr. Jacqlyn Larsen and Dr. Aleene Davidson with [facility] for management of significant nephrolithiasis but patient states that it has been sometime since he has seen him. 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  Regarding UTI urine cultures were negative for any growth but the (Rocephin while in here and discharge him with Keflex. Patient advised  to follow up to see urology, her primary his primary doctor in 1 week. History of  fall COPD continue inhalers History of hypothyroidism continue Synthroid Acute on chronic renal failure with kidney disease stage III ;improved with IV fluids. History of schizophrenia continue IV fluids.  Follow UP  Follow-up Information    Follow up with Bender, Durene Cal, MD In 1 week.   Specialty:  Family Medicine   Contact information:   [address] [place] [address] [phone]         Discharge Instructions  and  Discharge Medications        Medication List    TAKE these medications        cephALEXin 500 MG capsule  Commonly known as:  KEFLEX  Take 1 capsule (500 mg total) by mouth 4 (four) times daily.     COMBIVENT 18-103 MCG/ACT inhaler  Generic drug:  albuterol-ipratropium  Inhale 2 puffs into the lungs every 6 (six) hours as needed for wheezing.     diphenhydrAMINE 50 MG capsule  Commonly known as:  BENADRYL  Take 50 mg by mouth at bedtime.     FLUoxetine 40 MG capsule  Commonly known as:  PROZAC  Take 40 mg by mouth daily.     Fluticasone-Salmeterol 250-50 MCG/DOSE Aepb  Commonly known as:  ADVAIR  Inhale 1 puff into the lungs 2 (two) times daily.     hydrocortisone 25 MG suppository  Commonly known as:  ANUSOL-HC  Place 25 mg rectally 3 (three) times daily as needed for hemorrhoids or itching.     levothyroxine 50 MCG tablet  Commonly known as:  SYNTHROID, LEVOTHROID  Take 50 mcg by mouth daily before breakfast.     nystatin 100000 UNIT/ML suspension  Commonly known as:  MYCOSTATIN  Take 5 mLs (500,000 Units total) by mouth 4 (four) times daily.     risperiDONE 0.5 MG tablet  Commonly known as:  RISPERDAL  Take 0.5 mg by mouth at bedtime.     sodium bicarbonate 650 MG tablet  Take 2 tablets (1,300 mg total) by mouth 2 (two) times daily.     solifenacin 5 MG tablet  Commonly known as:  VESICARE  Take 5 mg by mouth daily.     tamsulosin 0.4 MG Caps capsule  Commonly known as:  FLOMAX  Take 0.4 mg by mouth  daily.          Diet and Activity recommendation: See Discharge Instructions above   Consults obtained - urology,PT,endocrinology   Major procedures and Radiology Reports - PLEASE review detailed and final reports for all details, in brief -     Ct Head Wo Contrast  01/17/2015   CLINICAL DATA:  Fall  EXAM: CT HEAD WITHOUT CONTRAST  CT CERVICAL SPINE WITHOUT CONTRAST  TECHNIQUE: Multidetector CT imaging of the head and cervical spine was performed following the standard protocol without intravenous contrast. Multiplanar CT image reconstructions of the cervical spine were also generated.  COMPARISON:  11/02/2012  FINDINGS: CT HEAD FINDINGS  No skull fracture is noted. No intracranial hemorrhage, mass effect or midline shift. No acute cortical infarction. Paranasal sinuses and mastoid air cells are unremarkable. No hydrocephalus. The gray and white-matter differentiation is preserved. No mass lesion is noted on this unenhanced scan. Ventricular size is stable from prior exam.  CT CERVICAL SPINE FINDINGS  Axial images of the cervical spine shows no acute fracture or subluxation. Computer processed images shows no acute fracture or subluxation. Degenerative  changes are noted C1-C2 articulation. There is disc space flattening with endplate sclerotic changes mild anterior and mild posterior spurring at C3-C4 and C5-C6 level. Disc space flattening with mild anterior spurring at C6-C7 level. No prevertebral soft tissue swelling. Cervical airway is patent. Mild posterior spurring at C6-C7 level.  There is no pneumothorax in visualized lung apices. 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 Multilevel facet degenerative changes are noted.  IMPRESSION: 1. No acute intracranial abnormality.  No significant change. 2. No cervical spine acute fracture or subluxation. Degenerative changes as described above.   Electronically Signed   By: Lahoma Crocker M.D.   On: 01/17/2015 11:41   Ct  Cervical Spine Wo Contrast  01/17/2015   CLINICAL DATA:  Fall  EXAM: CT HEAD WITHOUT CONTRAST  CT CERVICAL SPINE WITHOUT CONTRAST  TECHNIQUE: Multidetector CT imaging of the head and cervical spine was performed following the standard protocol without intravenous contrast. Multiplanar CT image reconstructions of the cervical spine were also generated.  COMPARISON:  11/02/2012  FINDINGS: CT HEAD FINDINGS  No skull fracture is noted. No intracranial hemorrhage, mass effect or midline shift. No acute cortical infarction. Paranasal sinuses and mastoid air cells are unremarkable. No hydrocephalus. The gray and white-matter differentiation is preserved. No mass lesion is noted on this unenhanced scan. Ventricular size is stable from prior exam.  CT CERVICAL SPINE FINDINGS  Axial images of the cervical spine shows no acute fracture or subluxation. Computer processed images shows no acute fracture or subluxation. Degenerative changes are noted C1-C2 articulation. There is disc space flattening with endplate sclerotic changes mild anterior and mild posterior spurring at C3-C4 and C5-C6 level. Disc space flattening with mild anterior spurring at C6-C7 level. No prevertebral soft tissue swelling. Cervical airway is patent. Mild posterior spurring at C6-C7 level.  There is no pneumothorax in visualized lung apices. 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 Multilevel facet degenerative changes are noted.  IMPRESSION: 1. No acute intracranial abnormality.  No significant change. 2. No cervical spine acute fracture or subluxation. Degenerative changes as described above.   Electronically Signed   By: Lahoma Crocker M.D.   On: 01/17/2015 11:41   Ct Pelvis Wo Contrast  01/17/2015   CLINICAL DATA:  Probable urinary bladder stone  EXAM: CT PELVIS WITHOUT CONTRAST  TECHNIQUE: Multidetector CT imaging of the pelvis was performed following the standard protocol without intravenous contrast.  COMPARISON:   11/06/2012  FINDINGS: Degenerative changes are noted bilateral SI joints. Mild degenerative changes lower lumbar spine. Atherosclerotic calcifications of abdominal aorta and iliac arteries. No small bowel obstruction. Abundant stool and gas noted within rectum which is distended measures about 7.2 cm in diameter suspicious for mild fecal impaction. No thickened or dilated small bowel loop. No ascites or free air. There is a urinary bladder/penile catheter. There is a calcified calculus within bladder at the tip of catheter measures 6 mm. Significant thickening of urinary bladder wall highly suspicious for cystitis. There is no inguinal adenopathy. Pelvic phleboliths are again noted.  IMPRESSION: 1. There is urinary bladder catheter. There is a calcified bladder calculus at the tip of catheter measures 6 mm. 2. Significant thickening of the wall of urinary bladder highly suspicious for cystitis or chronic inflammation. 3. Abundant stool and gas noted within rectum measures 7.2 cm in diameter. Highly suspicious for mild fecal impaction. 4. No inguinal adenopathy.   Electronically Signed   By: Lahoma Crocker M.D.   On:  01/17/2015 13:38   Dg Chest Port 1 View  01/17/2015   CLINICAL DATA:  Increase shortness of breath today post fall. Generalized weakness, sepsis.  EXAM: PORTABLE CHEST - 1 VIEW  COMPARISON:  12/05/2013  FINDINGS: The heart size and mediastinal contours are within normal limits. Both lungs are clear. The visualized skeletal structures are unremarkable.  IMPRESSION: No active disease.   Electronically Signed   By: Rolm Baptise M.D.   On: 01/17/2015 10:24    Micro Results    Recent Results (from the past 240 hour(s))  Urine culture     Status: None   Collection Time: 01/17/15  9:47 AM  Result Value Ref Range Status   Specimen Description URINE, CATHETERIZED  Final   Special Requests NONE  Final   Culture MULTIPLE SPECIES PRESENT, SUGGEST RECOLLECTION  Final   Report Status 01/18/2015 FINAL   Final  Blood culture (routine x 2)     Status: None   Collection Time: 01/17/15 10:04 AM  Result Value Ref Range Status   Specimen Description BLOOD LEFT ANTECUBITAL  Final   Special Requests BOTTLES DRAWN AEROBIC AND ANAEROBIC 10ML  Final   Culture NO GROWTH 5 DAYS  Final   Report Status 01/22/2015 FINAL  Final  Blood culture (routine x 2)     Status: None   Collection Time: 01/17/15 10:12 AM  Result Value Ref Range Status   Specimen Description BLOOD LEFT HAND  Final   Special Requests BOTTLES DRAWN AEROBIC AND ANAEROBIC 10ML  Final   Culture NO GROWTH 5 DAYS  Final   Report Status 01/22/2015 FINAL  Final  Urine culture     Status: None (Preliminary result)   Collection Time: 01/20/15 10:20 AM  Result Value Ref Range Status   Specimen Description URINE, CATHETERIZED  Final   Special Requests NONE  Final   Culture NO GROWTH < 24 HOURS  Final   Report Status PENDING  Incomplete       Today   Subjective:   Alan Small today has no headache,no chest abdominal pain,no new weakness tingling or numbness, feels much better wants to go home today.   Objective:   Blood pressure 112/74, pulse 76, temperature 98.2 F (36.8 C), temperature source Oral, resp. rate 18, height 6' (1.829 m), weight 69.536 kg (153 lb 4.8 oz), SpO2 99 %.   Intake/Output Summary (Last 24 hours) at 01/22/15 0909 Last data filed at 01/22/15 0206  Gross per 24 hour  Intake    480 ml  Output   3800 ml  Net  -3320 ml    Exam Awake Alert, Oriented x 3, No new F.N deficits, Normal affect [place].AT,PERRAL Supple Neck,No JVD, No cervical lymphadenopathy appriciated.  Symmetrical Chest wall movement, Good air movement bilaterally, CTAB RRR,No Gallops,Rubs or new Murmurs, No Parasternal Heave +ve B.Sounds, Abd Soft, Non tender, No organomegaly appriciated, No rebound -guarding or rigidity. No Cyanosis, Clubbing or edema, No new Rash or bruise  Data Review   CBC w Diff: Lab Results  Component Value Date    WBC 10.6 01/19/2015   WBC 12.8* 09/19/2014   HGB 12.2* 01/19/2015   HGB 13.2 09/19/2014   HCT 37.0* 01/19/2015   HCT 40.5 09/19/2014   PLT 152 01/19/2015   PLT 173 09/19/2014   LYMPHOPCT 5 01/17/2015   LYMPHOPCT 11.5 01/15/2014   MONOPCT 3 01/17/2015   MONOPCT 12.1 01/15/2014   EOSPCT 0 01/17/2015   EOSPCT 2.8 01/15/2014   BASOPCT 0 01/17/2015   BASOPCT  0.6 01/15/2014    CMP: Lab Results  Component Value Date   NA 139 01/20/2015   NA 139 09/19/2014   K 4.0 01/20/2015   K 3.7 09/19/2014   CL 108 01/20/2015   CL 110 09/19/2014   CO2 26 01/20/2015   CO2 25 09/19/2014   BUN 15 01/20/2015   BUN 39* 09/19/2014   CREATININE 1.67* 01/20/2015   CREATININE 2.30* 09/19/2014   CREATININE 3.44* 11/07/2012   PROT 6.8 01/17/2015   PROT 6.8 09/19/2014   ALBUMIN 3.4* 01/17/2015   ALBUMIN 3.5 09/19/2014   BILITOT 0.4 01/17/2015   BILITOT 0.4 09/19/2014   ALKPHOS 87 01/17/2015   ALKPHOS 108 09/19/2014   AST 34 01/17/2015   AST 24 09/19/2014   ALT 13* 01/17/2015   ALT 15* 09/19/2014  .   Total Time in preparing paper work, data evaluation and todays exam - 39 minutes  Cataleah Stites M.D on 01/22/2015 at 9:09 AM

## 2015-01-31 ENCOUNTER — Encounter: Payer: Self-pay | Admitting: *Deleted

## 2015-01-31 ENCOUNTER — Emergency Department
Admission: EM | Admit: 2015-01-31 | Discharge: 2015-01-31 | Disposition: A | Discharge status: Home / Self Care | Payer: Medicare Other | Attending: Emergency Medicine | Admitting: Emergency Medicine

## 2015-01-31 DIAGNOSIS — Y846 Urinary catheterization as the cause of abnormal reaction of the patient, or of later complication, without mention of misadventure at the time of the procedure: Secondary | ICD-10-CM | POA: Diagnosis not present

## 2015-01-31 DIAGNOSIS — Z720 Tobacco use: Secondary | ICD-10-CM | POA: Insufficient documentation

## 2015-01-31 DIAGNOSIS — T83098A Other mechanical complication of other indwelling urethral catheter, initial encounter: Secondary | ICD-10-CM | POA: Diagnosis not present

## 2015-01-31 DIAGNOSIS — Z79899 Other long term (current) drug therapy: Secondary | ICD-10-CM | POA: Insufficient documentation

## 2015-01-31 DIAGNOSIS — T839XXA Unspecified complication of genitourinary prosthetic device, implant and graft, initial encounter: Secondary | ICD-10-CM

## 2015-01-31 NOTE — Discharge Instructions (Signed)
On exam, all appears well. He had no redness or discharge. The urine is flowing through the Foley catheter properly. He had no tenderness over your bladder or in your back. He had no fever.  Follow-up with your regular doctors and with urology for further evaluation. Return to the emergency department if he had fever, pain, or other urgent concerns.

## 2015-01-31 NOTE — ED Notes (Signed)
Pt states he has been having burning around his urinary catheter since this morning. Pt states he had this catheter replaced about a week or so ago, denies difficulty draining, fevers, or blood in urine.

## 2015-01-31 NOTE — ED Provider Notes (Signed)
[facility] Emergency Department Provider Note  ____________________________________________  Time seen: 1842   I have reviewed the triage vital signs and the nursing notes.   HISTORY  Chief Complaint Urinary Catheter Problem      HPI Alan Small is a 59 y.o. male who reports he is feeling some burning in his penis. He doesn't a Foley catheter in place. He reports this was last changed approximately a week and a half ago. He tells me this pain started today. He then tells me he has had this pain before, including last week, but he is not able to outline for me what he thinks causes a over the causes been in the past.  He denies any fever. He denies any abdominal pain. His urine is flowing through the Foley catheter properly with no flow around and no discoloration.  The history is a bit limited, the patient provides information slowly and perhaps without the greatest accuracy. As we talked further, I find out that he was hospitalized recently and that he is just finishing a course of antibiotics today.    Past Medical History  Diagnosis Date  . Mental disorder   . Hypothyroidism   . Seizures   . Myocardial infarction   . Stroke   . Asthma     Patient Active Problem List   Diagnosis Date Noted  . UTI (lower urinary tract infection) 01/17/2015  . Hypoglycemia 01/17/2015  . Metabolic acidosis 0000000  . Acute renal failure 11/03/2012  . Hyperkalemia 11/03/2012  . Pyelonephritis 11/03/2012  . Schizophrenia 11/03/2012  . Hypothyroidism 11/03/2012  . Severe sepsis 11/03/2012  . Protein-calorie malnutrition, severe 11/03/2012  . Encephalopathy acute 11/03/2012    Past Surgical History  Procedure Laterality Date  . Appendectomy    . Colonoscopy with propofol N/A 12/07/2014    Procedure: COLONOSCOPY WITH PROPOFOL;  Surgeon: Manya Silvas, MD;  Location: [facility] ENDOSCOPY;  Service: Endoscopy;  Laterality: N/A;  . Esophagogastroduodenoscopy N/A  12/07/2014    Procedure: ESOPHAGOGASTRODUODENOSCOPY (EGD);  Surgeon: Manya Silvas, MD;  Location: [facility] ENDOSCOPY;  Service: Endoscopy;  Laterality: N/A;  . Nephrectomy      Current Outpatient Rx  Name  Route  Sig  Dispense  Refill  . albuterol-ipratropium (COMBIVENT) 18-103 MCG/ACT inhaler   Inhalation   Inhale 2 puffs into the lungs every 6 (six) hours as needed for wheezing.         . cephALEXin (KEFLEX) 500 MG capsule   Oral   Take 1 capsule (500 mg total) by mouth 4 (four) times daily.   40 capsule   0   . diphenhydrAMINE (BENADRYL) 50 MG capsule   Oral   Take 50 mg by mouth at bedtime.         [name] FLUoxetine (PROZAC) 40 MG capsule   Oral   Take 40 mg by mouth daily.         . Fluticasone-Salmeterol (ADVAIR) 250-50 MCG/DOSE AEPB   Inhalation   Inhale 1 puff into the lungs 2 (two) times daily.         . hydrocortisone (ANUSOL-HC) 25 MG suppository   Rectal   Place 25 mg rectally 3 (three) times daily as needed for hemorrhoids or itching.         . levothyroxine (SYNTHROID, LEVOTHROID) 50 MCG tablet   Oral   Take 50 mcg by mouth daily before breakfast.         . nystatin (MYCOSTATIN) 100000 UNIT/ML suspension   Oral  Take 5 mLs (500,000 Units total) by mouth 4 (four) times daily.   60 mL   0   . risperiDONE (RISPERDAL) 0.5 MG tablet   Oral   Take 0.5 mg by mouth at bedtime.         . sodium bicarbonate 650 MG tablet   Oral   Take 2 tablets (1,300 mg total) by mouth 2 (two) times daily. Patient taking differently: Take 650 mg by mouth 2 (two) times daily.    20 tablet      . solifenacin (VESICARE) 5 MG tablet   Oral   Take 5 mg by mouth daily.         . tamsulosin (FLOMAX) 0.4 MG CAPS capsule   Oral   Take 0.4 mg by mouth daily.           Allergies Mellaril; Morphine and related; Navane; and Thorazine  Family History  Problem Relation Age of Onset  . Stroke Father     Social History Social History  Substance Use Topics  .  Smoking status: Current Every Day Smoker -- 1.00 packs/day for 55 years    Types: Cigarettes  . Smokeless tobacco: Former Systems developer    Types: Chew  . Alcohol Use: No    Review of Systems  Constitutional: Negative for fever. ENT: Negative for sore throat. Cardiovascular: Negative for chest pain. Respiratory: Negative for shortness of breath. Gastrointestinal: Negative for abdominal pain, vomiting and diarrhea. Genitourinary: Burning and discomfort in his penis. See history of present illness Musculoskeletal: No myalgias or injuries. Skin: Negative for rash. Neurological: Negative for headaches   10-point ROS otherwise negative.  ____________________________________________   PHYSICAL EXAM:  VITAL SIGNS: ED Triage Vitals  Enc Vitals Group     BP 01/31/15 1719 98/71 mmHg     Pulse Rate 01/31/15 1719 75     Resp 01/31/15 1719 18     Temp 01/31/15 1719 97.9 F (36.6 C)     Temp Source 01/31/15 1719 Oral     SpO2 01/31/15 1719 96 %     Weight 01/31/15 1719 153 lb (69.4 kg)     Height 01/31/15 1719 6' (1.829 m)     Head Cir --      Peak Flow --      Pain Score 01/31/15 1720 4     Pain Loc --      Pain Edu? --      Excl. in [place]? --     Constitutional:  Alert, communicative, pleasant, but he does appear to be a little bit slow with mild limits on his cognition. Well appearing and in no distress. ENT   Head: Normocephalic and atraumatic.   Nose: No congestion/rhinnorhea.   Mouth/Throat: Mucous membranes are moist. Cardiovascular: Normal rate, regular rhythm, no murmur noted Respiratory:  Normal respiratory effort, no tachypnea.    Breath sounds are clear and equal bilaterally.  Gastrointestinal: Soft and nontender. No distention. No pain over the suprapubic area. Genitourinary: Normal external male genitalia, circumcised, with a Foley catheter in place that appears overall clean and fresh. No discharge, no erythema. Back: No muscle spasm, no tenderness, no CVA  tenderness. Musculoskeletal: No deformity noted. Nontender with normal range of motion in all extremities.  No noted edema. Neurologic:  Normal speech and language. No gross focal neurologic deficits are appreciated.  Skin:  Skin is warm, dry. No rash noted. The patient does have some healing areas on his right thigh that are chronic. This appears to be areas  irritated by the straps holding his leg bag in place. Psychiatric: Pleasant, alert, communicative, but seems to lack the ability to give certain details and history.  ____________________________________________  ____________________________________________   INITIAL IMPRESSION / ASSESSMENT AND PLAN / ED COURSE  Pertinent labs & imaging results that were available during my care of the patient were reviewed by me and considered in my medical decision making (see chart for details).  Given the well appearance of this 59 year old man with chronic psychiatric and urinary issues, I do not see any indication for testing or treatment. If we evaluated his urine, I would certainly expect to see white blood cells due to his long-term indwelling Foley use, however the urine itself looks clear, it is slowing properly, he has no suprapubic pain or CVA tenderness, he's had no fever, and he denies any other acute change. He reports he's had pain like this before. He just finished antibiotics. When I have reviewed this with the patient and asked him if he had any thoughts for how I could help him, he reports he feels fine and he has no suggestions.  The patient would benefit from outpatient evaluation by urology. Meanwhile, as all looks well, we'll discharge him from the emergency department.  __________________________________________   FINAL CLINICAL IMPRESSION(S) / ED DIAGNOSES  Final diagnoses:  Foley catheter problem, initial encounter      Ahmed Prima, MD 01/31/15 [phone]

## 2015-02-14 IMAGING — CR DG ABDOMEN 1V
1 series · 2 of 2 positions shown · non-contrast
Comparison: none

REASON FOR EXAM: Calculus
COMMENTS:

[Series 1: t abdomen supine · 0.14mm/px · 2 of 2 slices shown]
[im 1/2]
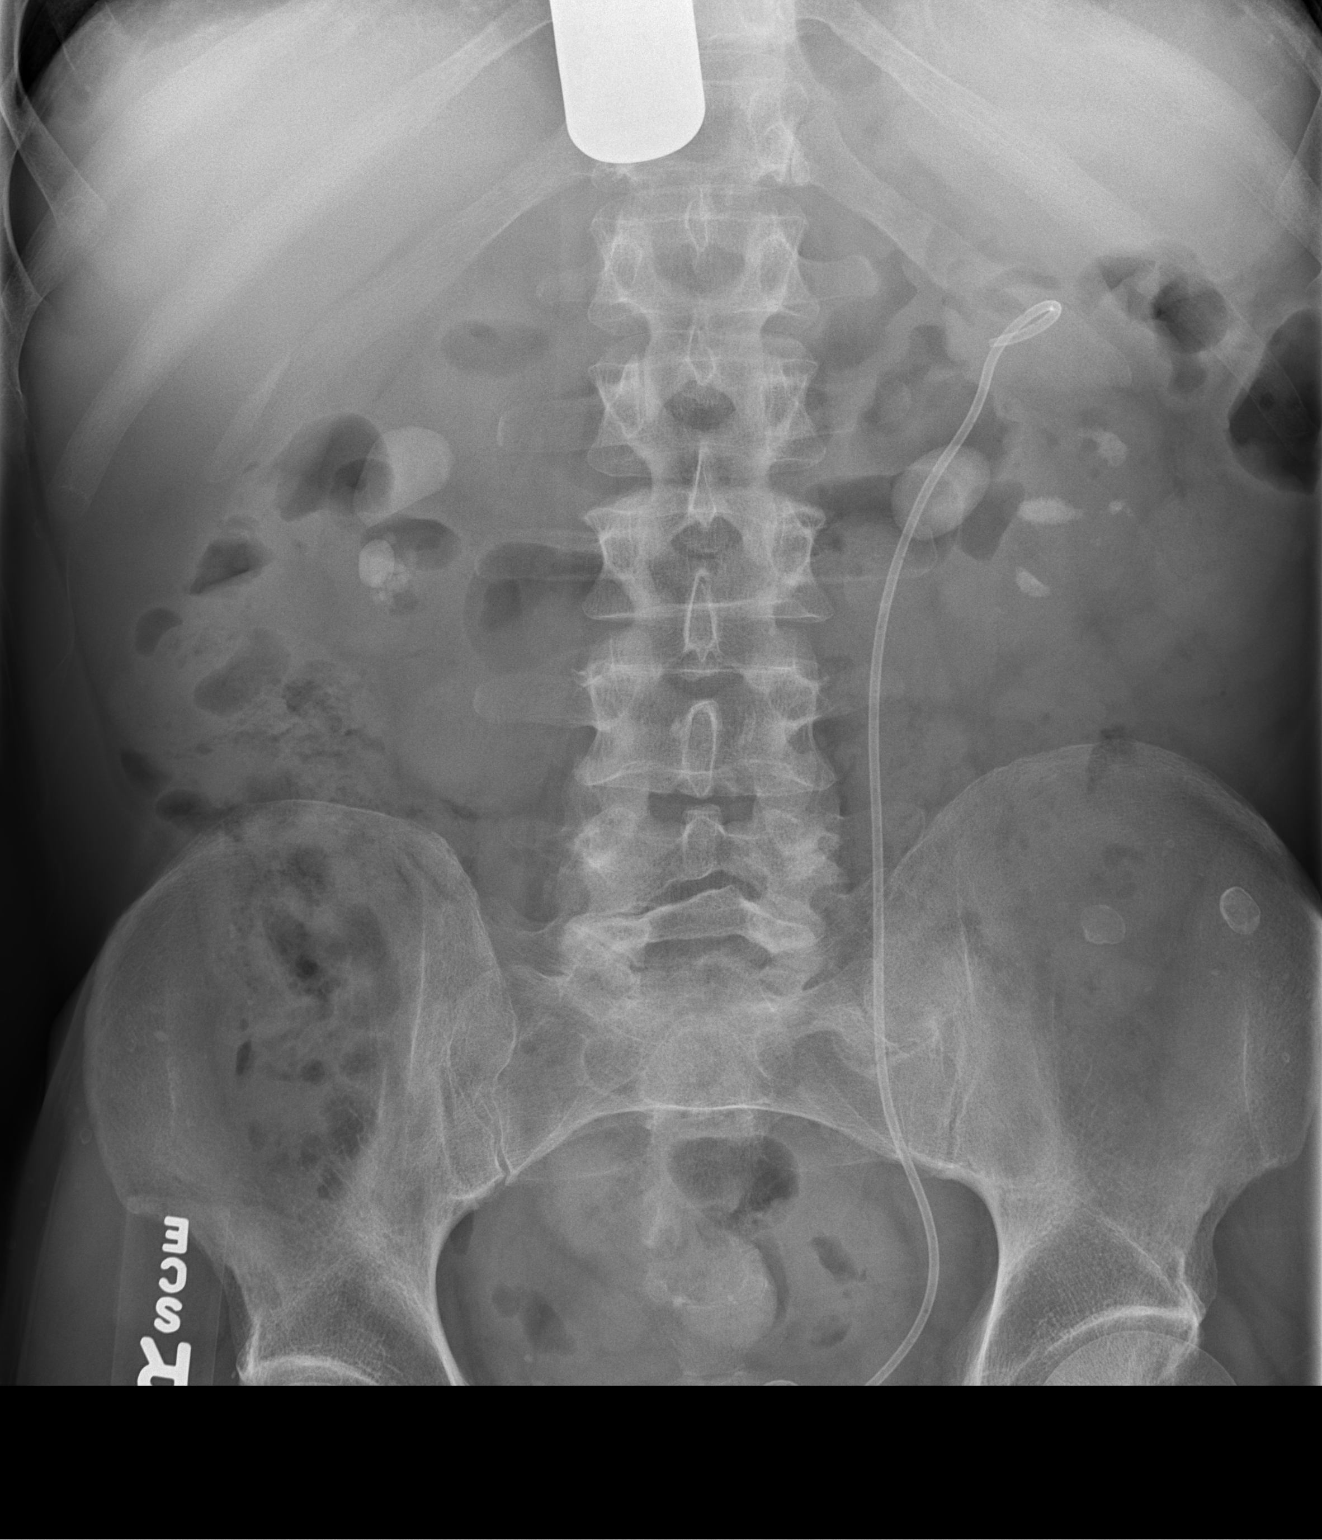
[im 2/2]
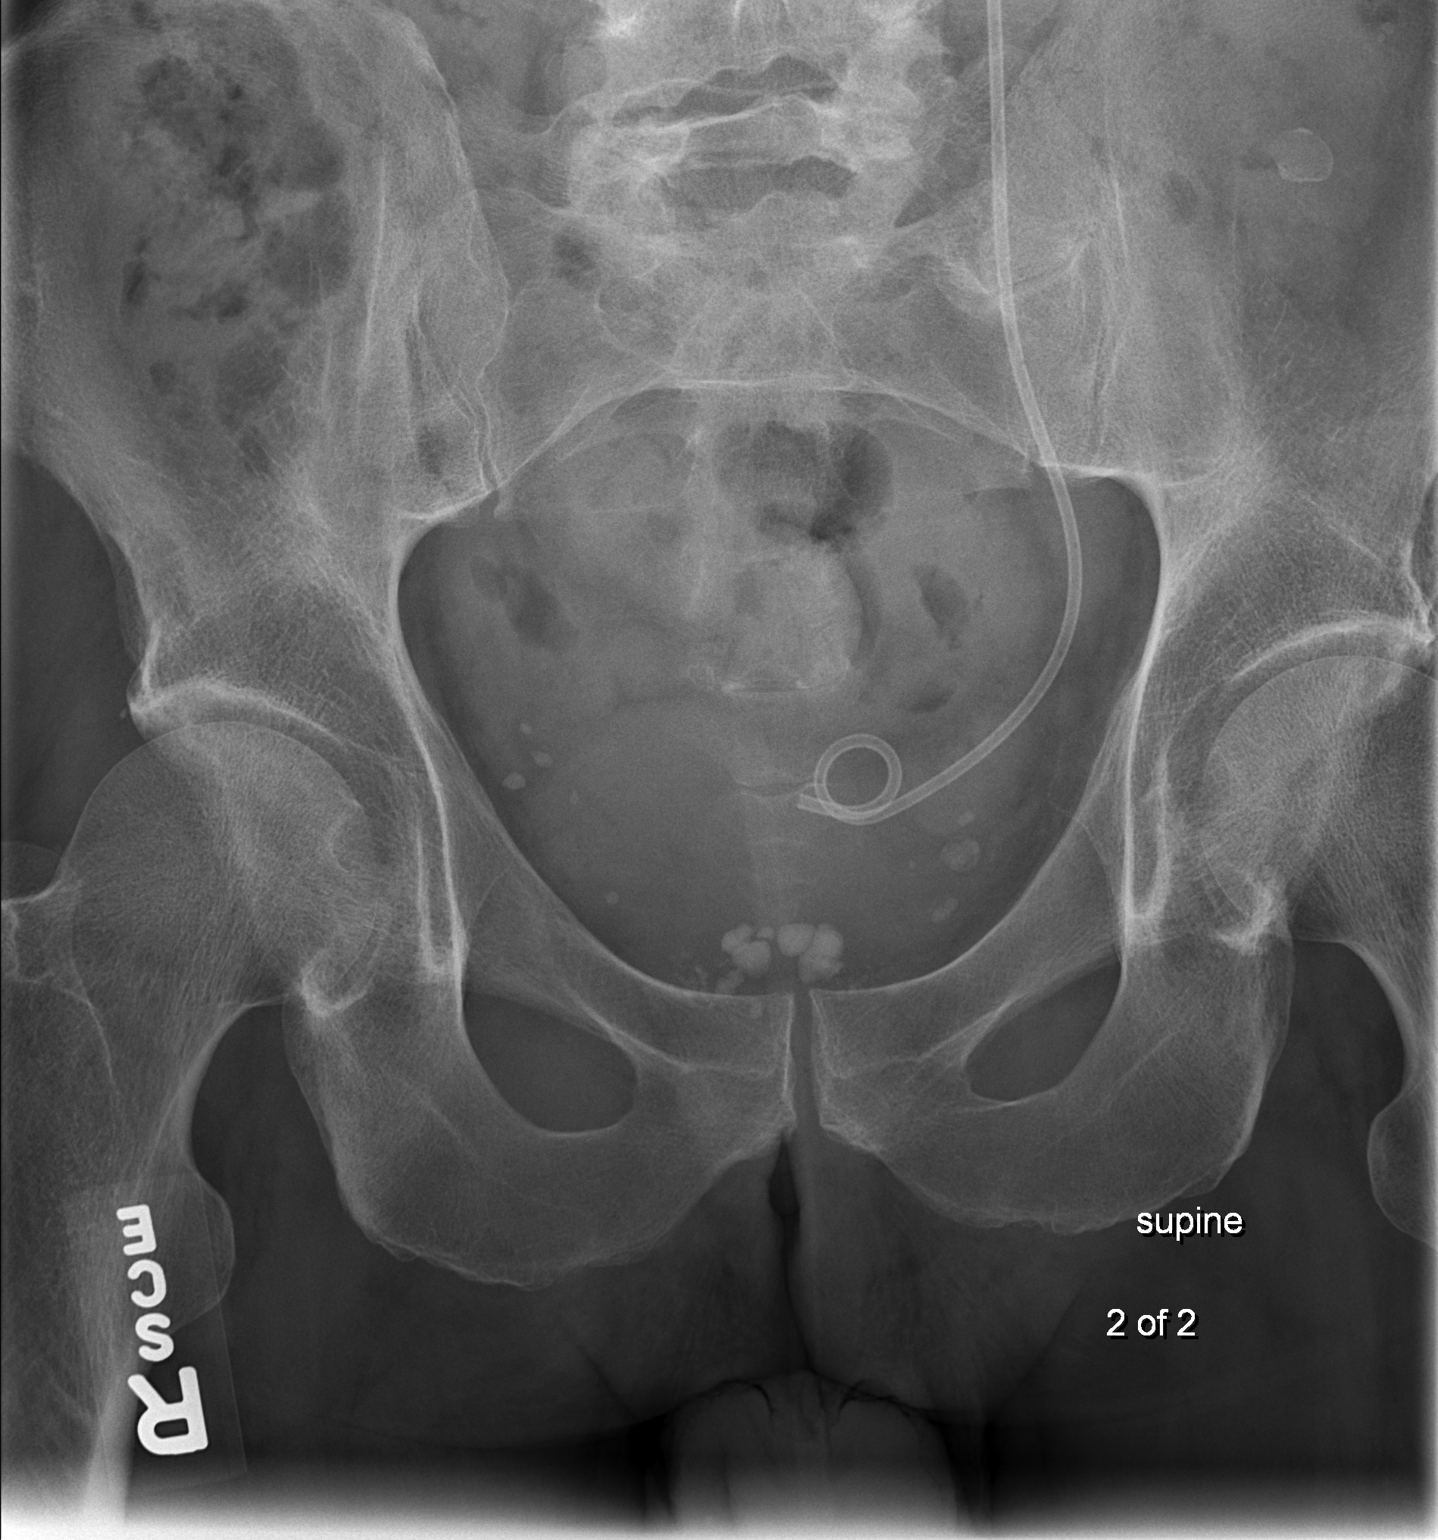

[2 of 2 positions shown; findings below may reference images not displayed]

PROCEDURE:     DXR - DXR KIDNEY URETER BLADDER  - July 22, 2012 [DATE]

RESULT:     Comparison is made to the study January 27, 2009.

There is a double-J ureteral stent in place on the left. There are multiple
large calcified stones on the left. The largest lies in the renal pelvis and
measures 2.8 cm in greatest dimension. There is a similar sized stone
projecting over the midpole of the right kidney measuring at least 2.9 cm.
There are phleboliths within the pelvis. The bony structures exhibit no
acute abnormality. There are likely prostatic calcifications.
IMPRESSION: There are multiple bilateral kidney stones. There is a
double-J ureteral stent in place on the left.

[REDACTED]

## 2015-02-22 ENCOUNTER — Encounter: Payer: Self-pay | Admitting: Intensive Care

## 2015-02-22 ENCOUNTER — Other Ambulatory Visit: Payer: Self-pay

## 2015-02-22 ENCOUNTER — Emergency Department
Admission: EM | Admit: 2015-02-22 | Discharge: 2015-02-22 | Disposition: A | Discharge status: Home / Self Care | Payer: Medicare Other | Attending: Emergency Medicine | Admitting: Emergency Medicine

## 2015-02-22 DIAGNOSIS — I959 Hypotension, unspecified: Secondary | ICD-10-CM | POA: Diagnosis not present

## 2015-02-22 DIAGNOSIS — Z7952 Long term (current) use of systemic steroids: Secondary | ICD-10-CM | POA: Diagnosis not present

## 2015-02-22 DIAGNOSIS — N390 Urinary tract infection, site not specified: Secondary | ICD-10-CM | POA: Diagnosis not present

## 2015-02-22 DIAGNOSIS — Z7951 Long term (current) use of inhaled steroids: Secondary | ICD-10-CM | POA: Diagnosis not present

## 2015-02-22 DIAGNOSIS — Z79899 Other long term (current) drug therapy: Secondary | ICD-10-CM | POA: Insufficient documentation

## 2015-02-22 DIAGNOSIS — Z792 Long term (current) use of antibiotics: Secondary | ICD-10-CM | POA: Diagnosis not present

## 2015-02-22 DIAGNOSIS — Z720 Tobacco use: Secondary | ICD-10-CM | POA: Insufficient documentation

## 2015-02-22 DIAGNOSIS — R531 Weakness: Secondary | ICD-10-CM

## 2015-02-22 HISTORY — DX: Disorder of kidney and ureter, unspecified: N28.9

## 2015-02-22 HISTORY — DX: Major depressive disorder, single episode, unspecified: F32.9

## 2015-02-22 HISTORY — DX: Unspecified hydronephrosis: N13.30

## 2015-02-22 HISTORY — DX: Gastric ulcer, unspecified as acute or chronic, without hemorrhage or perforation: K25.9

## 2015-02-22 HISTORY — DX: Residual hemorrhoidal skin tags: K64.4

## 2015-02-22 HISTORY — DX: Paranoid schizophrenia: F20.0

## 2015-02-22 HISTORY — DX: Insomnia, unspecified: G47.00

## 2015-02-22 HISTORY — DX: Depression, unspecified: F32.A

## 2015-02-22 LAB — CBC WITH DIFFERENTIAL/PLATELET
BASOS ABS: 0.1 10*3/uL (ref 0–0.1)
Basophils Relative: 1 %
EOS PCT: 2 %
Eosinophils Absolute: 0.2 10*3/uL (ref 0–0.7)
HCT: 43.1 % (ref 40.0–52.0)
HEMOGLOBIN: 14.6 g/dL (ref 13.0–18.0)
LYMPHS ABS: 1.7 10*3/uL (ref 1.0–3.6)
LYMPHS PCT: 14 %
MCH: 31.1 pg (ref 26.0–34.0)
MCHC: 33.8 g/dL (ref 32.0–36.0)
MCV: 91.9 fL (ref 80.0–100.0)
Monocytes Absolute: 0.9 10*3/uL (ref 0.2–1.0)
Monocytes Relative: 8 %
NEUTROS PCT: 75 %
Neutro Abs: 9 10*3/uL — ABNORMAL HIGH (ref 1.4–6.5)
PLATELETS: 138 10*3/uL — AB (ref 150–440)
RBC: 4.69 MIL/uL (ref 4.40–5.90)
RDW: 15.3 % — ABNORMAL HIGH (ref 11.5–14.5)
WBC: 11.9 10*3/uL — AB (ref 3.8–10.6)

## 2015-02-22 LAB — URINALYSIS COMPLETE WITH MICROSCOPIC (ARMC ONLY)
BILIRUBIN URINE: NEGATIVE
GLUCOSE, UA: NEGATIVE mg/dL
HGB URINE DIPSTICK: NEGATIVE
Ketones, ur: NEGATIVE mg/dL
Nitrite: POSITIVE — AB
PH: 7 (ref 5.0–8.0)
Protein, ur: NEGATIVE mg/dL
SPECIFIC GRAVITY, URINE: 1.01 (ref 1.005–1.030)

## 2015-02-22 LAB — BASIC METABOLIC PANEL
Anion gap: 7 (ref 5–15)
BUN: 29 mg/dL — AB (ref 6–20)
CALCIUM: 9.4 mg/dL (ref 8.9–10.3)
CHLORIDE: 108 mmol/L (ref 101–111)
CO2: 26 mmol/L (ref 22–32)
CREATININE: 2.23 mg/dL — AB (ref 0.61–1.24)
GFR calc non Af Amer: 30 mL/min — ABNORMAL LOW (ref >60)
GFR, EST AFRICAN AMERICAN: 35 mL/min — AB (ref >60)
GLUCOSE: 90 mg/dL (ref 65–99)
Potassium: 4.8 mmol/L (ref 3.5–5.1)
Sodium: 141 mmol/L (ref 135–145)

## 2015-02-22 MED ORDER — SODIUM CHLORIDE 0.9 % IV BOLUS (SEPSIS)
1000.0000 mL | Freq: Once | INTRAVENOUS | Status: AC
Start: 2015-02-22 — End: 2015-02-22
  Administered 2015-02-22: 1000 mL via INTRAVENOUS

## 2015-02-22 MED ORDER — SULFAMETHOXAZOLE-TRIMETHOPRIM 800-160 MG PO TABS: 1.0000 | ORAL_TABLET | Freq: Two times a day (BID) | ORAL | Status: DC

## 2015-02-22 NOTE — Discharge Instructions (Signed)
Please have your creatinine rechecked in the next 2-3 days. Please seek medical attention for any high fevers, chest pain, shortness of breath, change in behavior, persistent vomiting, bloody stool or any other new or concerning symptoms.   Urinary Tract Infection Urinary tract infections (UTIs) can develop anywhere along your urinary tract. Your urinary tract is your body's drainage system for removing wastes and extra water. Your urinary tract includes two kidneys, two ureters, a bladder, and a urethra. Your kidneys are a pair of bean-shaped organs. Each kidney is about the size of your fist. They are located below your ribs, one on each side of your spine. CAUSES Infections are caused by microbes, which are microscopic organisms, including fungi, viruses, and bacteria. These organisms are so small that they can only be seen through a microscope. Bacteria are the microbes that most commonly cause UTIs. SYMPTOMS  Symptoms of UTIs may vary by age and gender of the patient and by the location of the infection. Symptoms in young women typically include a frequent and intense urge to urinate and a painful, burning feeling in the bladder or urethra during urination. Older women and men are more likely to be tired, shaky, and weak and have muscle aches and abdominal pain. A fever may mean the infection is in your kidneys. Other symptoms of a kidney infection include pain in your back or sides below the ribs, nausea, and vomiting. DIAGNOSIS To diagnose a UTI, your caregiver will ask you about your symptoms. Your caregiver also will ask to provide a urine sample. The urine sample will be tested for bacteria and white blood cells. White blood cells are made by your body to help fight infection. TREATMENT  Typically, UTIs can be treated with medication. Because most UTIs are caused by a bacterial infection, they usually can be treated with the use of antibiotics. The choice of antibiotic and length of treatment  depend on your symptoms and the type of bacteria causing your infection. HOME CARE INSTRUCTIONS  If you were prescribed antibiotics, take them exactly as your caregiver instructs you. Finish the medication even if you feel better after you have only taken some of the medication.  Drink enough water and fluids to keep your urine clear or pale yellow.  Avoid caffeine, tea, and carbonated beverages. They tend to irritate your bladder.  Empty your bladder often. Avoid holding urine for long periods of time.  Empty your bladder before and after sexual intercourse.  After a bowel movement, women should cleanse from front to back. Use each tissue only once. SEEK MEDICAL CARE IF:   You have back pain.  You develop a fever.  Your symptoms do not begin to resolve within 3 days. SEEK IMMEDIATE MEDICAL CARE IF:   You have severe back pain or lower abdominal pain.  You develop chills.  You have nausea or vomiting.  You have continued burning or discomfort with urination. MAKE SURE YOU:   Understand these instructions.  Will watch your condition.  Will get help right away if you are not doing well or get worse. Document Released: 02/26/2005 Document Revised: 11/18/2011 Document Reviewed: 06/27/2011 [facility] Patient Information 2015 [place], [place]. This information is not intended to replace advice given to you by your health care provider. Make sure you discuss any questions you have with your health care provider.

## 2015-02-22 NOTE — ED Notes (Signed)
Patient arrived by ems from [facility]. Patient c/o weakness and low b/p

## 2015-02-22 NOTE — ED Notes (Signed)
[place] Event organiser of nursing care at [facility]  [phone]

## 2015-02-22 NOTE — ED Provider Notes (Signed)
[facility] Emergency Department Provider Note    ____________________________________________  Time seen: 1310  I have reviewed the triage vital signs and the nursing notes.   HISTORY  Chief Complaint Weakness   History limited by: Not Limited   HPI Alan Small is a 59 y.o. male who presents to the emergency department from his living facility today because of concerns for an episode of hypotension. The patient states that he gets his blood pressure checked twice a week routinely. He states today when they checked his blood pressure systolic was in the [id number]. He states he felt a little lightheaded today. He states he has been having episodes of lightheadedness on and off for the past year. At the time of my evaluation the patient states he no longer feels lightheaded. He denies any recent decrease in oral intake. States he had one episode of vomiting a couple of days ago however no episodes of diarrhea. Denies any fevers. Denies any chest pain shortness breath or palpitations.     Past Medical History  Diagnosis Date  . Mental disorder   . Seizures   . Myocardial infarction   . Stroke   . Asthma   . Paranoid schizophrenia   . Gastric ulcer   . External hemorrhoids   . Hypothyroidism     hypo  . Insomnia   . Depression   . Renal disorder     renal failure  . Hydronephrosis     Patient Active Problem List   Diagnosis Date Noted  . UTI (lower urinary tract infection) 01/17/2015  . Hypoglycemia 01/17/2015  . Metabolic acidosis 0000000  . Acute renal failure 11/03/2012  . Hyperkalemia 11/03/2012  . Pyelonephritis 11/03/2012  . Schizophrenia 11/03/2012  . Hypothyroidism 11/03/2012  . Severe sepsis 11/03/2012  . Protein-calorie malnutrition, severe 11/03/2012  . Encephalopathy acute 11/03/2012    Past Surgical History  Procedure Laterality Date  . Appendectomy    . Colonoscopy with propofol N/A 12/07/2014    Procedure: COLONOSCOPY  WITH PROPOFOL;  Surgeon: Manya Silvas, MD;  Location: [facility] ENDOSCOPY;  Service: Endoscopy;  Laterality: N/A;  . Esophagogastroduodenoscopy N/A 12/07/2014    Procedure: ESOPHAGOGASTRODUODENOSCOPY (EGD);  Surgeon: Manya Silvas, MD;  Location: [facility] ENDOSCOPY;  Service: Endoscopy;  Laterality: N/A;  . Nephrectomy      Current Outpatient Rx  Name  Route  Sig  Dispense  Refill  . albuterol-ipratropium (COMBIVENT) 18-103 MCG/ACT inhaler   Inhalation   Inhale 2 puffs into the lungs every 6 (six) hours as needed for wheezing.         . cephALEXin (KEFLEX) 500 MG capsule   Oral   Take 1 capsule (500 mg total) by mouth 4 (four) times daily.   40 capsule   0   . diphenhydrAMINE (BENADRYL) 50 MG capsule   Oral   Take 50 mg by mouth at bedtime.         [name] FLUoxetine (PROZAC) 40 MG capsule   Oral   Take 40 mg by mouth daily.         . Fluticasone-Salmeterol (ADVAIR) 250-50 MCG/DOSE AEPB   Inhalation   Inhale 1 puff into the lungs 2 (two) times daily.         . hydrocortisone (ANUSOL-HC) 25 MG suppository   Rectal   Place 25 mg rectally 3 (three) times daily as needed for hemorrhoids or itching.         . levothyroxine (SYNTHROID, LEVOTHROID) 50 MCG tablet  Oral   Take 50 mcg by mouth daily before breakfast.         . nystatin (MYCOSTATIN) 100000 UNIT/ML suspension   Oral   Take 5 mLs (500,000 Units total) by mouth 4 (four) times daily.   60 mL   0   . risperiDONE (RISPERDAL) 0.5 MG tablet   Oral   Take 0.5 mg by mouth at bedtime.         . sodium bicarbonate 650 MG tablet   Oral   Take 2 tablets (1,300 mg total) by mouth 2 (two) times daily. Patient taking differently: Take 650 mg by mouth 2 (two) times daily.    20 tablet      . solifenacin (VESICARE) 5 MG tablet   Oral   Take 5 mg by mouth daily.         . tamsulosin (FLOMAX) 0.4 MG CAPS capsule   Oral   Take 0.4 mg by mouth daily.           Allergies Mellaril; Morphine and related; Navane;  and Thorazine  Family History  Problem Relation Age of Onset  . Stroke Father     Social History Social History  Substance Use Topics  . Smoking status: Current Every Day Smoker -- 1.00 packs/day for 55 years    Types: Cigarettes  . Smokeless tobacco: Former Systems developer    Types: Chew  . Alcohol Use: No    Review of Systems  Constitutional: Negative for fever. Cardiovascular: Negative for chest pain. Respiratory: Negative for shortness of breath. Gastrointestinal: Negative for abdominal pain, vomiting and diarrhea. Genitourinary: Negative for dysuria. Musculoskeletal: Negative for back pain. Skin: Negative for rash. Neurological: Negative for headaches, focal weakness or numbness.  10-point ROS otherwise negative.  ____________________________________________   PHYSICAL EXAM:  VITAL SIGNS: ED Triage Vitals  Enc Vitals Group     BP 02/22/15 1211 114/83 mmHg     Pulse Rate 02/22/15 1211 62     Resp --      Temp 02/22/15 1211 98.1 F (36.7 C)     Temp Source 02/22/15 1211 Oral     SpO2 02/22/15 1211 99 %     Weight 02/22/15 1211 159 lb 6.3 oz (72.3 kg)     Height 02/22/15 1211 6' (1.829 m)   Constitutional: Alert and oriented. Well appearing and in no distress. Eyes: Conjunctivae are normal. PERRL. Normal extraocular movements. ENT   Head: Normocephalic and atraumatic.   Nose: No congestion/rhinnorhea.   Mouth/Throat: Mucous membranes are moist.   Neck: No stridor. Hematological/Lymphatic/Immunilogical: No cervical lymphadenopathy. Cardiovascular: Normal rate, regular rhythm.  No murmurs, rubs, or gallops. Respiratory: Normal respiratory effort without tachypnea nor retractions. Breath sounds are clear and equal bilaterally. No wheezes/rales/rhonchi. Gastrointestinal: Soft and nontender. No distention. There is no CVA tenderness. Genitourinary: Deferred Musculoskeletal: Normal range of motion in all extremities. No joint effusions.  No lower extremity  tenderness nor edema. Neurologic:  Normal speech and language. No gross focal neurologic deficits are appreciated. Speech is normal.  Skin:  Skin is warm, dry and intact. No rash noted. Psychiatric: Mood and affect are normal. Speech and behavior are normal. Patient exhibits appropriate insight and judgment.  ____________________________________________    LABS (pertinent positives/negatives)  Labs Reviewed  CBC WITH DIFFERENTIAL/PLATELET - Abnormal; Notable for the following:    WBC 11.9 (*)    RDW 15.3 (*)    Platelets 138 (*)    Neutro Abs 9.0 (*)    All other components within normal  limits  URINALYSIS COMPLETEWITH MICROSCOPIC ([facility] ONLY) - Abnormal; Notable for the following:    Color, Urine YELLOW (*)    APPearance CLEAR (*)    Nitrite POSITIVE (*)    Leukocytes, UA 3+ (*)    Bacteria, UA RARE (*)    Squamous Epithelial / LPF 0-5 (*)    All other components within normal limits  BASIC METABOLIC PANEL - Abnormal; Notable for the following:    BUN 29 (*)    Creatinine, Ser 2.23 (*)    GFR calc non Af Amer 30 (*)    GFR calc Af Amer 35 (*)    All other components within normal limits  URINE CULTURE     ____________________________________________   EKG  I, Nance Pear, attending physician, personally viewed and interpreted this EKG  EKG Time: 1217 Rate: 61 Rhythm: NSR Axis: left axis deviation Intervals: qtc 438 QRS: narrow ST changes: no st elevation Impression: abnormal ekg- left axis deviation ____________________________________________    RADIOLOGY  None  ____________________________________________   PROCEDURES  Procedure(s) performed: None  Critical Care performed: No  ____________________________________________   INITIAL IMPRESSION / ASSESSMENT AND PLAN / ED COURSE  Pertinent labs & imaging results that were available during my care of the patient were reviewed by me and considered in my medical decision making (see chart for  details).  Patient presented today after an episode of hypotension at the facility. Patient not hypotensive here. Blood work does show some elevation of the creatinine and BUN concerning for dehydration. Did give patient a liter of fluid. I discussed this result the patient and asked that he have his creatinine rechecked in a couple of days. Furthermore the urine is still nitrite positive and I do have concern that the antibiotic was not sufficient. Will give prescription for antibiotic and send urine for urine culture.  ____________________________________________   FINAL CLINICAL IMPRESSION(S) / ED DIAGNOSES  Final diagnoses:  Weakness  UTI (lower urinary tract infection)     Nance Pear, MD 02/22/15 1622

## 2015-02-28 LAB — URINE CULTURE

## 2015-03-02 NOTE — Progress Notes (Signed)
Pt urine culture for ED visit came back positive for ESBL Ecoli and Acinetobacter. Acinetobacter sensitive to d/c med bactrim but Ecoli resistant to. Nitrofurantion was called into pharmacy day prior, however due to pt renal function this medication was discontinued. Pt is a resident at [facility] and [facility]. Spoke with [employer] the nursing M[employer] who stated that they could give IM injections short term. Per her request prescription and culture/senstivities were faxed over to [facility]. Rx was also called in. Ertapenem 500mg  IM once daily for 7 days was prescribed. Directions to reconsitute with 3.20ml of 1% lidocaine were provided along with orders to d/c macrobid. Orders provided by Dr. Kerman Passey. 03/01/15  Melissa D Maccia, Pharm.D Clinical Pharmacist

## 2015-03-14 IMAGING — CR DG ABDOMEN 1V
1 series · 2 of 2 positions shown · non-contrast
Comparison: none

REASON FOR EXAM: Nephrolothasis Renal Colic
COMMENTS:

PROCEDURE:     DXR - DXR KIDNEY URETER BLADDER  - August 19, 2012  [DATE]
RESULT:     Comparison made to prior study of 07/22/2012. Stone debris is now
noted projected over the left distal ureter. Bilateral nephrolithiasis
remains. Double-J left ureteral stent in stable position.

[Series 1: t abdomen supine · 0.14mm/px · 2 of 2 slices shown]
[im 1/2]
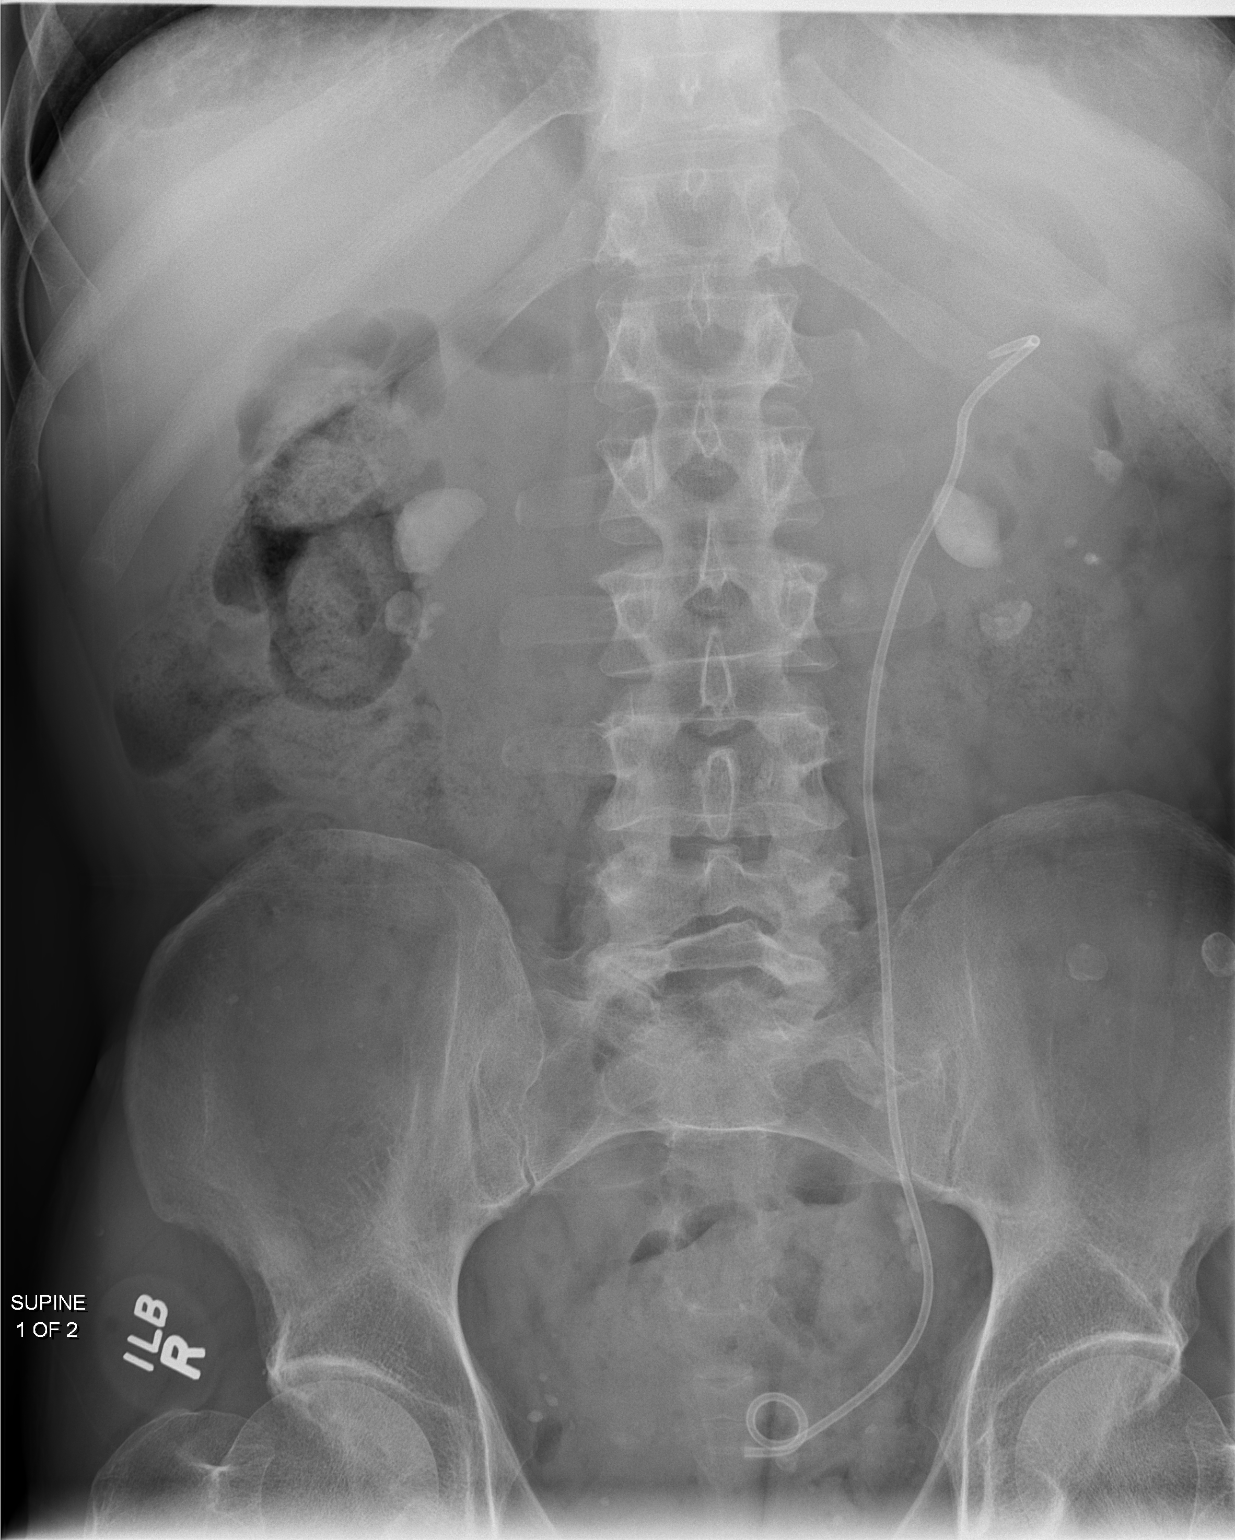
[im 2/2]
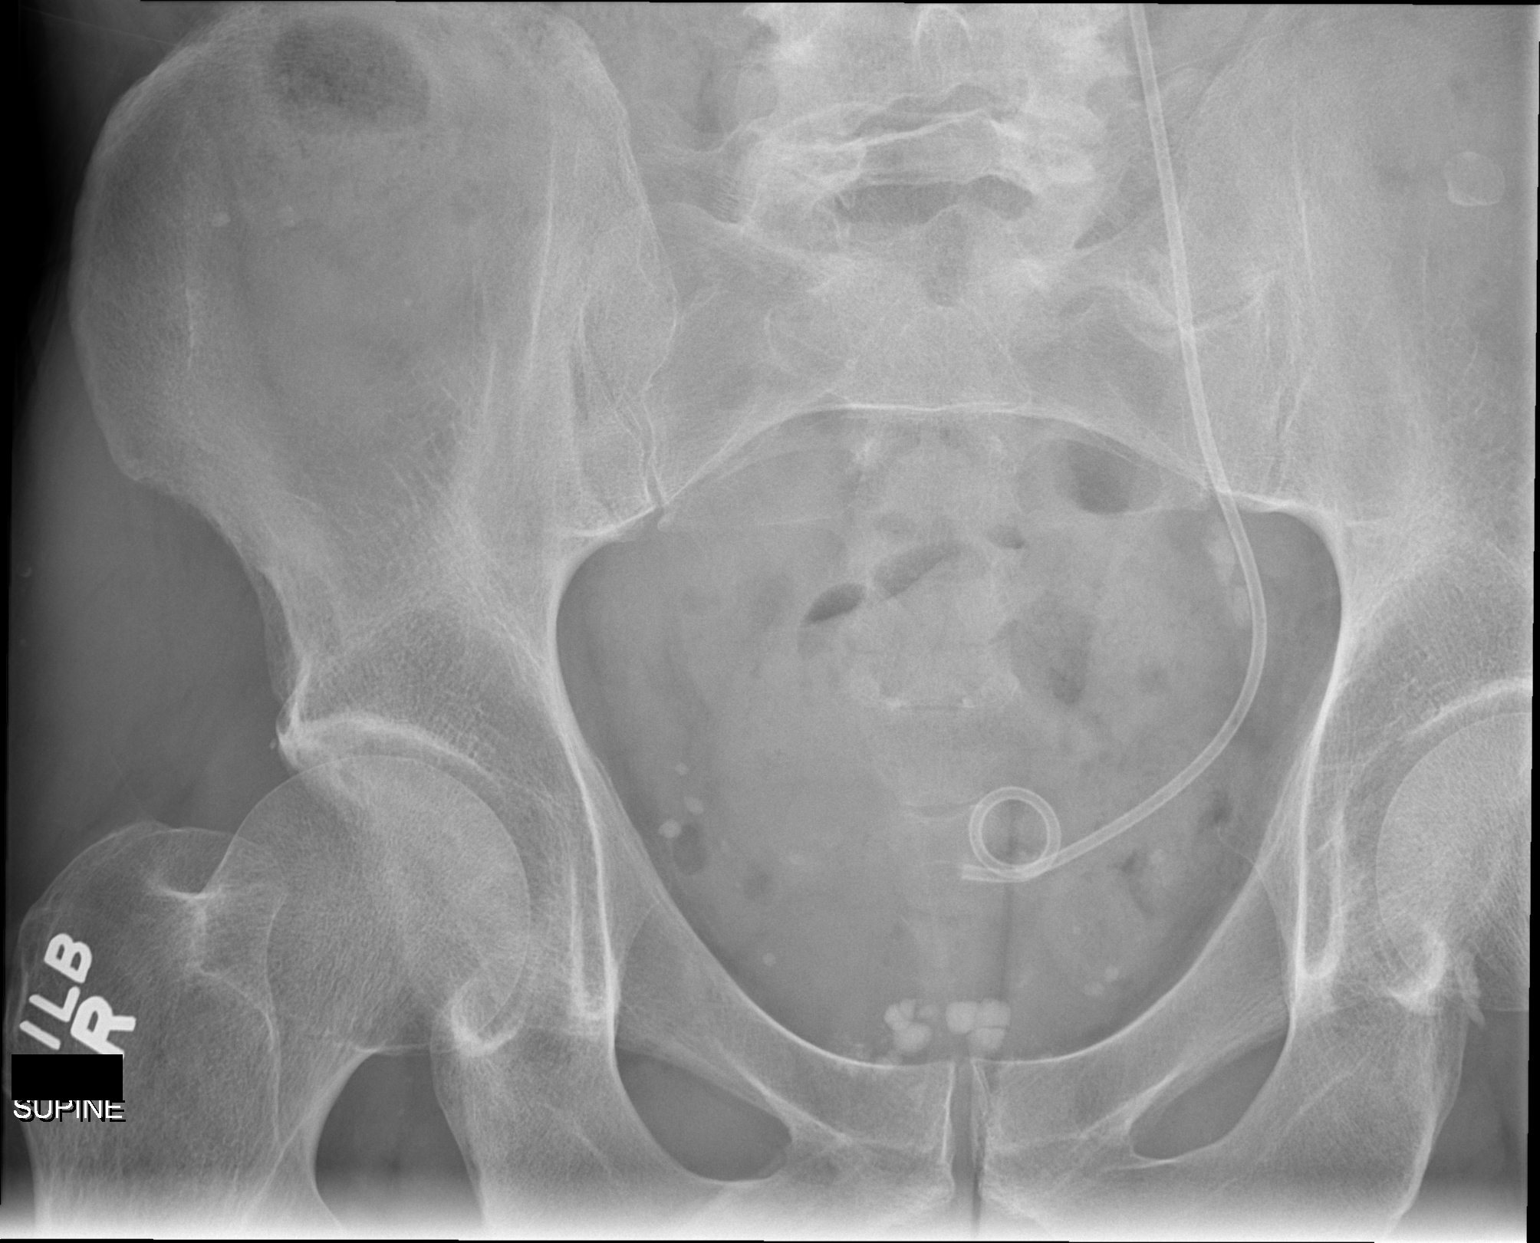

[2 of 2 positions shown; findings below may reference images not displayed]

IMPRESSION: Stone i debris now noted in the distribution of the left
distal ureter. Bilateral nephrolithiasis. Stable positioning of double-J
left ureteral stent.

## 2015-04-12 IMAGING — CT CT ABD-PELV W/O CM
1 of 2 series · 13 of 32 positions shown, 17 images · non-contrast
Comparison: none

REASON FOR EXAM: (1) flank pain, hematuria, fever, eval for obstructing
stone; (2) flank pain, he
COMMENTS:

PROCEDURE:     CT  - CT ABDOMEN AND PELVIS W[DATE]  [DATE]
RESULT:     Comparison: 05/05/2012
TECHNIQUE: Multiple axial images from the lung bases to the symphysis pubis
were obtained without oral and without intravenous contrast.

[Series 2: soft tissue · axial · 0.71mm/px · z∈[-978,-532]mm · 13 of 172 slices shown, 17 images]
[im 15/172  soft-tissue]
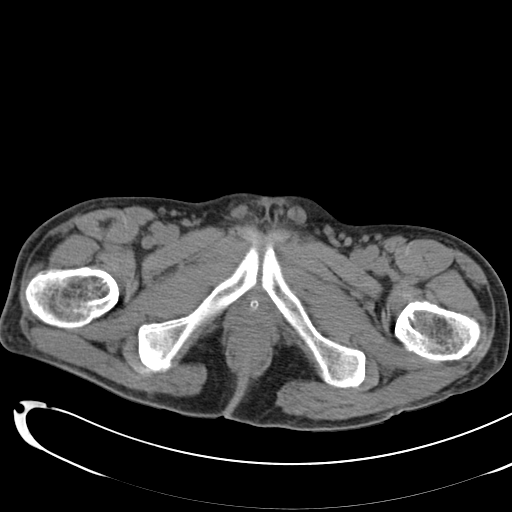
[im 15/172  bone]
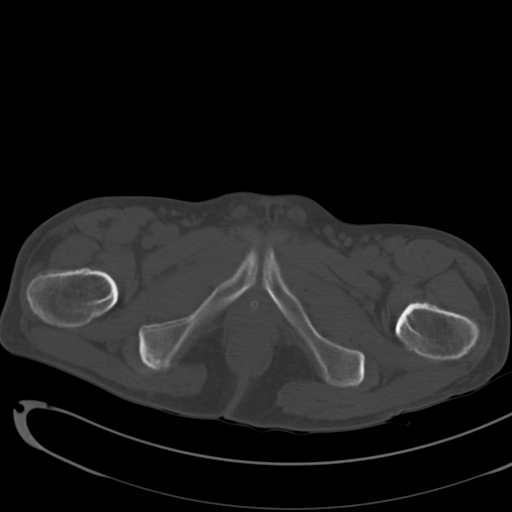
[im 30/172  soft-tissue]
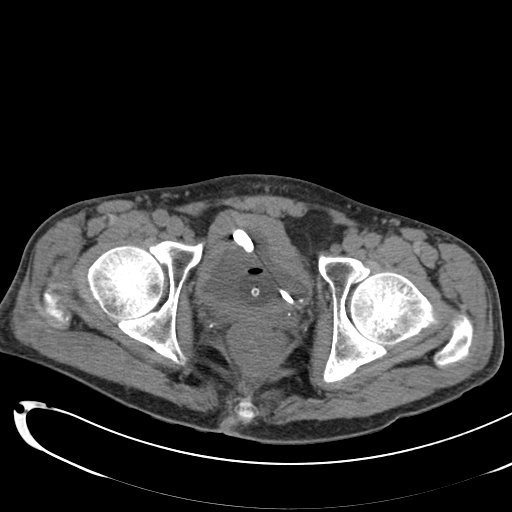
[im 45/172  soft-tissue]
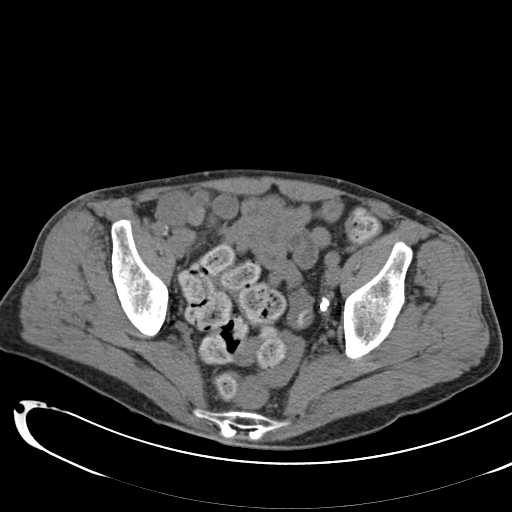
[im 60/172  soft-tissue]
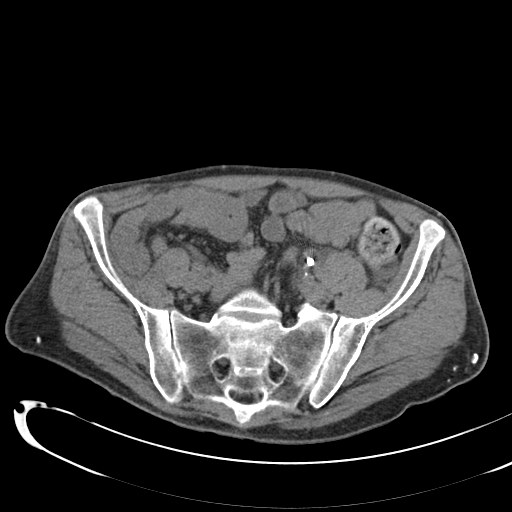
[im 75/172  soft-tissue]
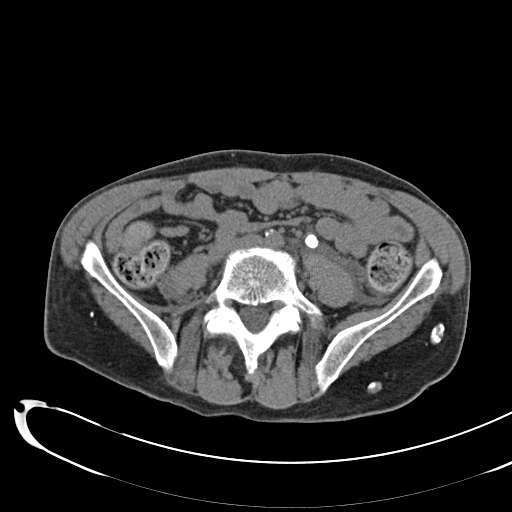
[im 90/172  soft-tissue]
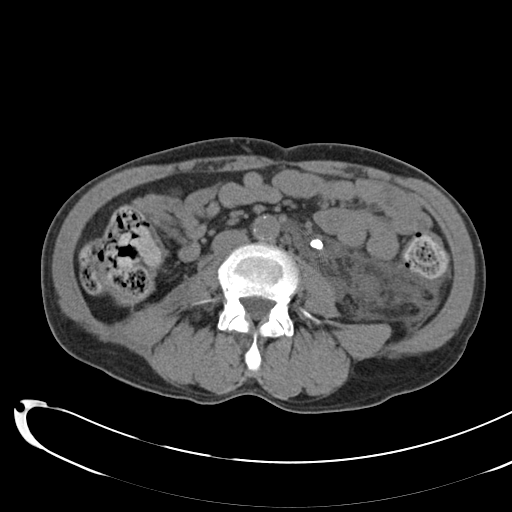
[im 105/172  soft-tissue]
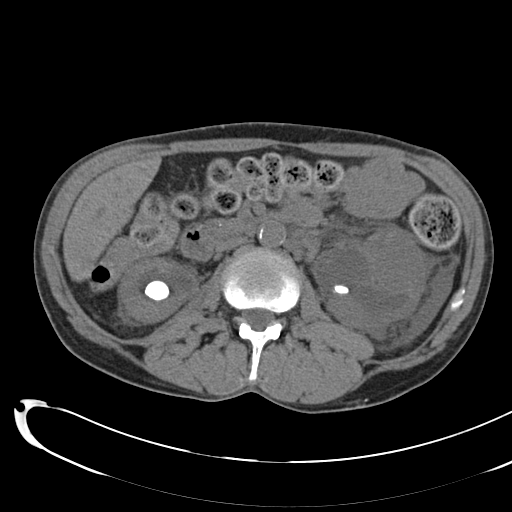
[im 119/172  soft-tissue]
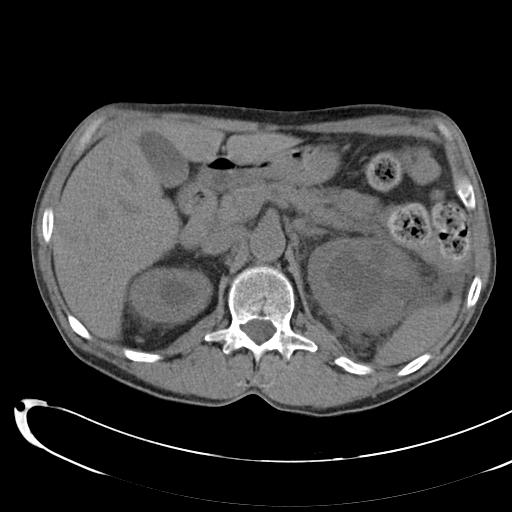
[im 134/172  soft-tissue]
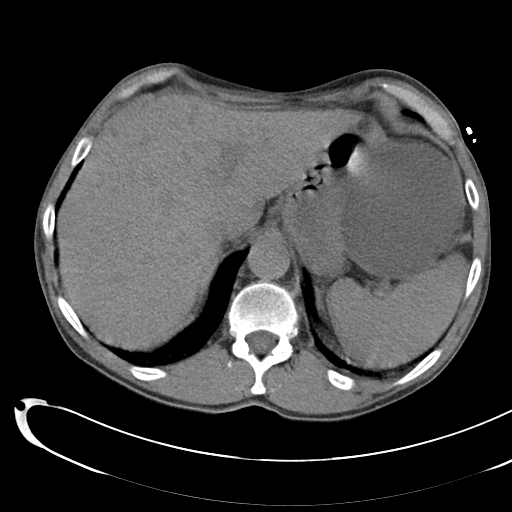
[im 134/172  bone]
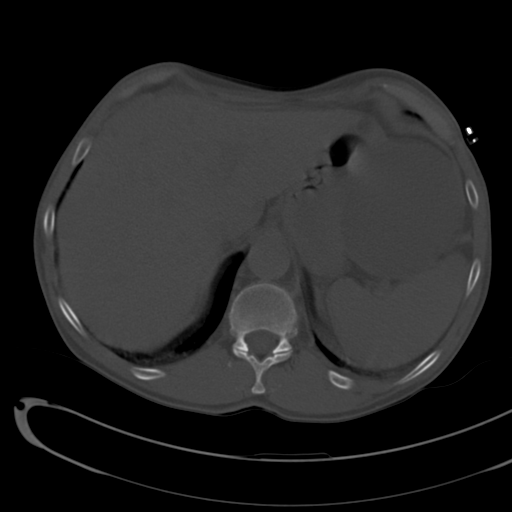
[im 142/172  lung]
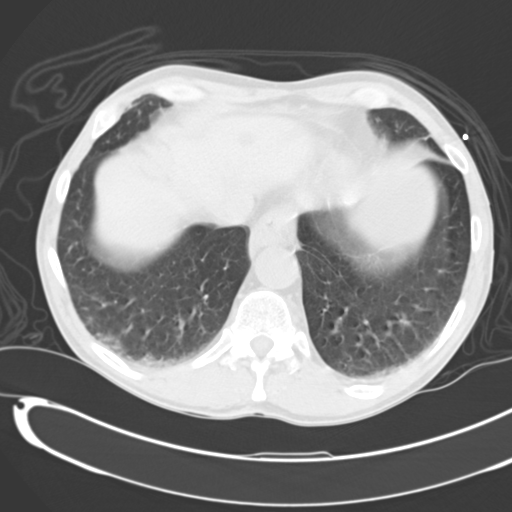
[im 149/172  soft-tissue]
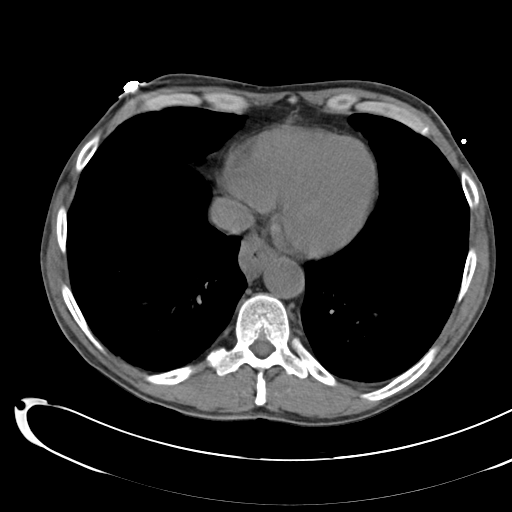
[im 149/172  lung]
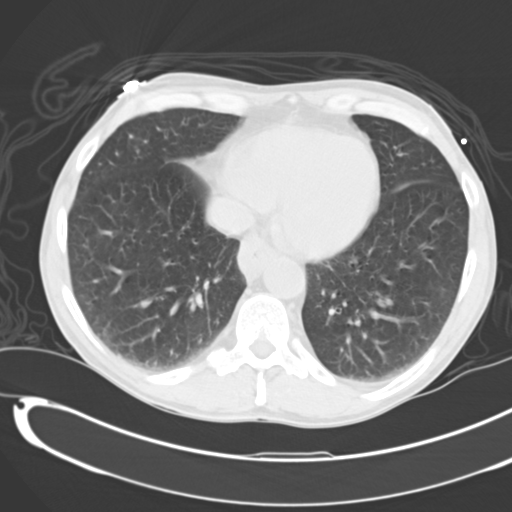
[im 157/172  lung]
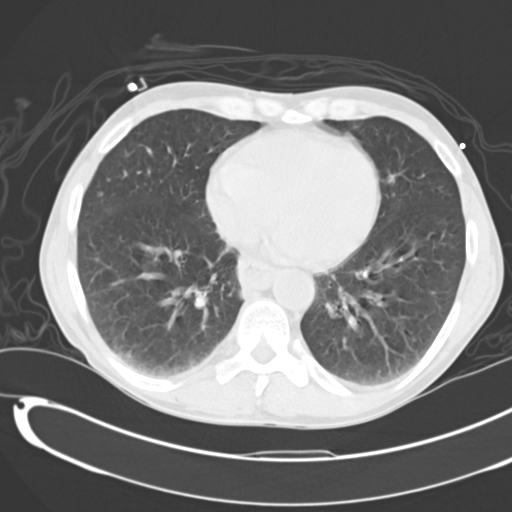
[im 164/172  soft-tissue]
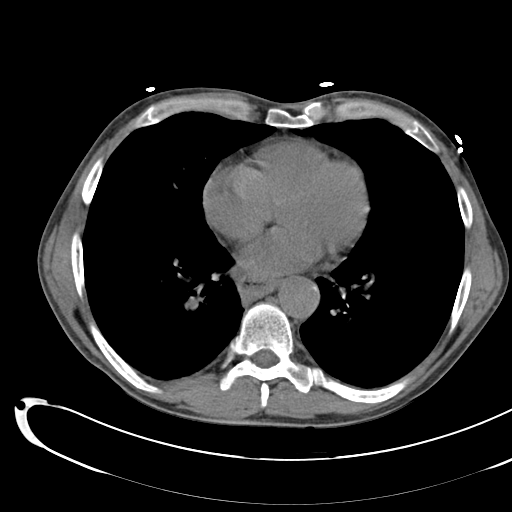
[im 164/172  lung]
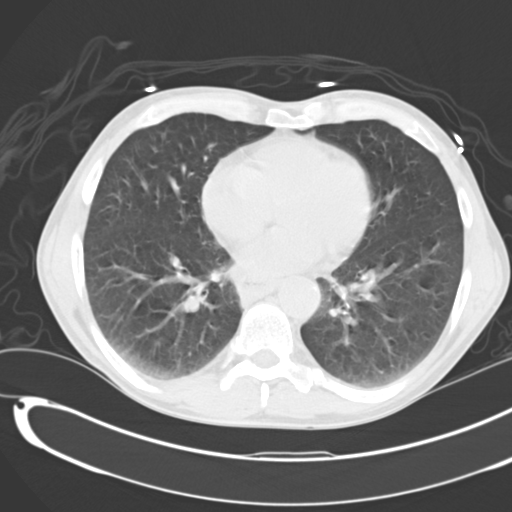

[13 of 32 positions shown; findings below may reference images not displayed]

FINDINGS: Mild basilar opacities are likely secondary to atelectasis. There is
thickening of the distal esophageal wall. This is nonspecific and may be
secondary to esophagitis. There is some debris within the distal esophagus.
There is a possible small hiatal hernia.

Lack of intravenous contrast limits evaluation of the solid abdominal
organs.  There are several low-attenuation lesions in the liver. The largest
is consistent with a cyst. The others are too small to characterize. The
spleen and pancreas are unremarkable. There is mild thickening of the left
adrenal gland, without definite discrete mass. There is a 9.9 x 7.8 cm
cystic structure in the left upper abdomen which is likely exophytic from
the left hepatic lobe. This likely represents a cyst, but is incompletely
characterized given lack of intravenous contrast. This is similar to the
most recent prior, but increased in size from 03/03/2010. The gallbladder is
unremarkable.

There is marked heterogeneity of the left kidney. This is likely secondary
to moderate to severe hydronephrosis on a background of a chronically
scarred kidney. This is similar to prior. Moderate left perinephric
stranding is similar to prior. There are multiple large calculi in the left
kidney. A double-J ureteral stent is seen on the left with the proximal coil
in the left renal pelvis and the distal coil in the bladder. There multiple
areas of hyperdensity adjacent to the ureteral stent which may represent
fractured stones within the ureter. There is mild to moderate right
hydronephrosis which is similar to slightly increased from prior. This may
be secondary to a ureteropelvic obstruction. No ureterectasis seen. There
are several large axial and the right kidney. There are several tiny calculi
dependently within the bladder, mostly on the right side of the bladder.
There may be one, and possibly two,  3 mm calculi within the right
ureterovesical junction.  There is circumferential bladder wall thickening
which is nonspecific.

There has been interval decrease and near complete resolution ofs the fluid
collections in the right psoas and paraspinous musculature.

The small and large bowel are normal in caliber. The appendix is not
identified. However, there are no inflammatory changes at the base of the
cecum. A Foley catheter is present the bladder. There are several prominent,
but not pathologically enlarged, retroperitoneal lymph nodes. These are
nonspecific.

No aggressive lytic or sclerotic osseous lesions are identified.
IMPRESSION: 1. There is marked heterogeneity of the left kidney which is likely related
to moderate to severe hydronephrosis on background of a chronically scarred
kidney. There is moderate left perinephric stranding which is nonspecific.
These findings are similar to prior. There is a double-J ureteral stent on
the left, which is new from prior. Correlate for appropriate stent function
2. There is mild to moderate hydronephrosis of the right kidney, which is
similar to slightly increased from prior. No right ureterectasis seen. This
may be secondary to a uteropelvic junction obstruction.
3. There are multiple tiny calculi dependently within the bladder. There may
be one, and possibly 2, 3 mm calculi within the ureter at the right
ureterovesical junction.
4. There is circumferential bladder wall thickening which is nonspecific.
This may be secondary to neurogenic bladder. Other etiologies such as
cystitis or malignancy are not excluded.
5. There is thickening of the distal esophageal wall. This is nonspecific
and may be secondary to esophagitis. Malignancy is not excluded. Consider
further evaluation with endoscopy.

## 2015-04-12 IMAGING — CR DG CHEST 2V
1 series · 3 of 3 positions shown · non-contrast
Comparison: none

REASON FOR EXAM: left rib pain
COMMENTS:

PROCEDURE:     DXR - DXR CHEST PA (OR AP) AND LATERAL  - September 17, 2012 [DATE]
RESULT:     Mediastinum and hilar structures are normal. Heart size normal.
Lungs clear of acute infiltrates. No COPD. Degenerative changes thoracic
spine.

[Series 1: pa · 0.17mm/px · 3 of 3 slices shown]
[im 1/3]
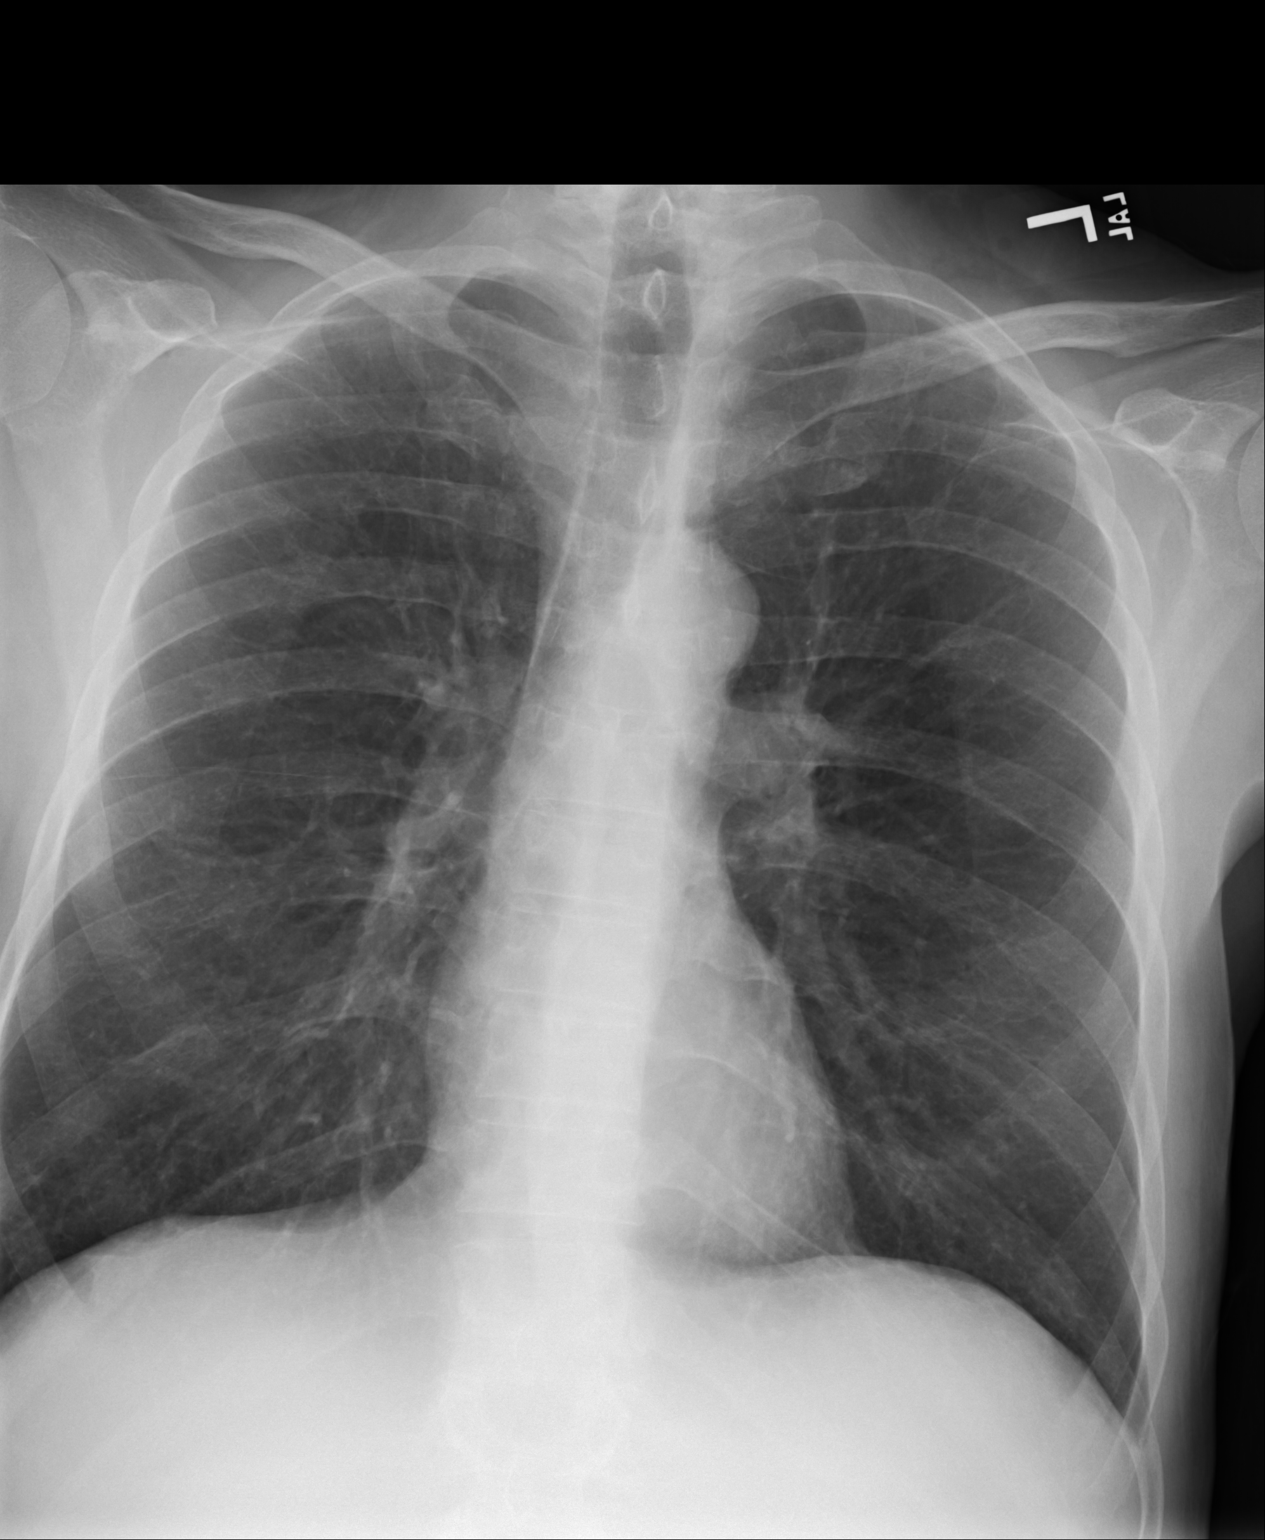
[im 2/3]
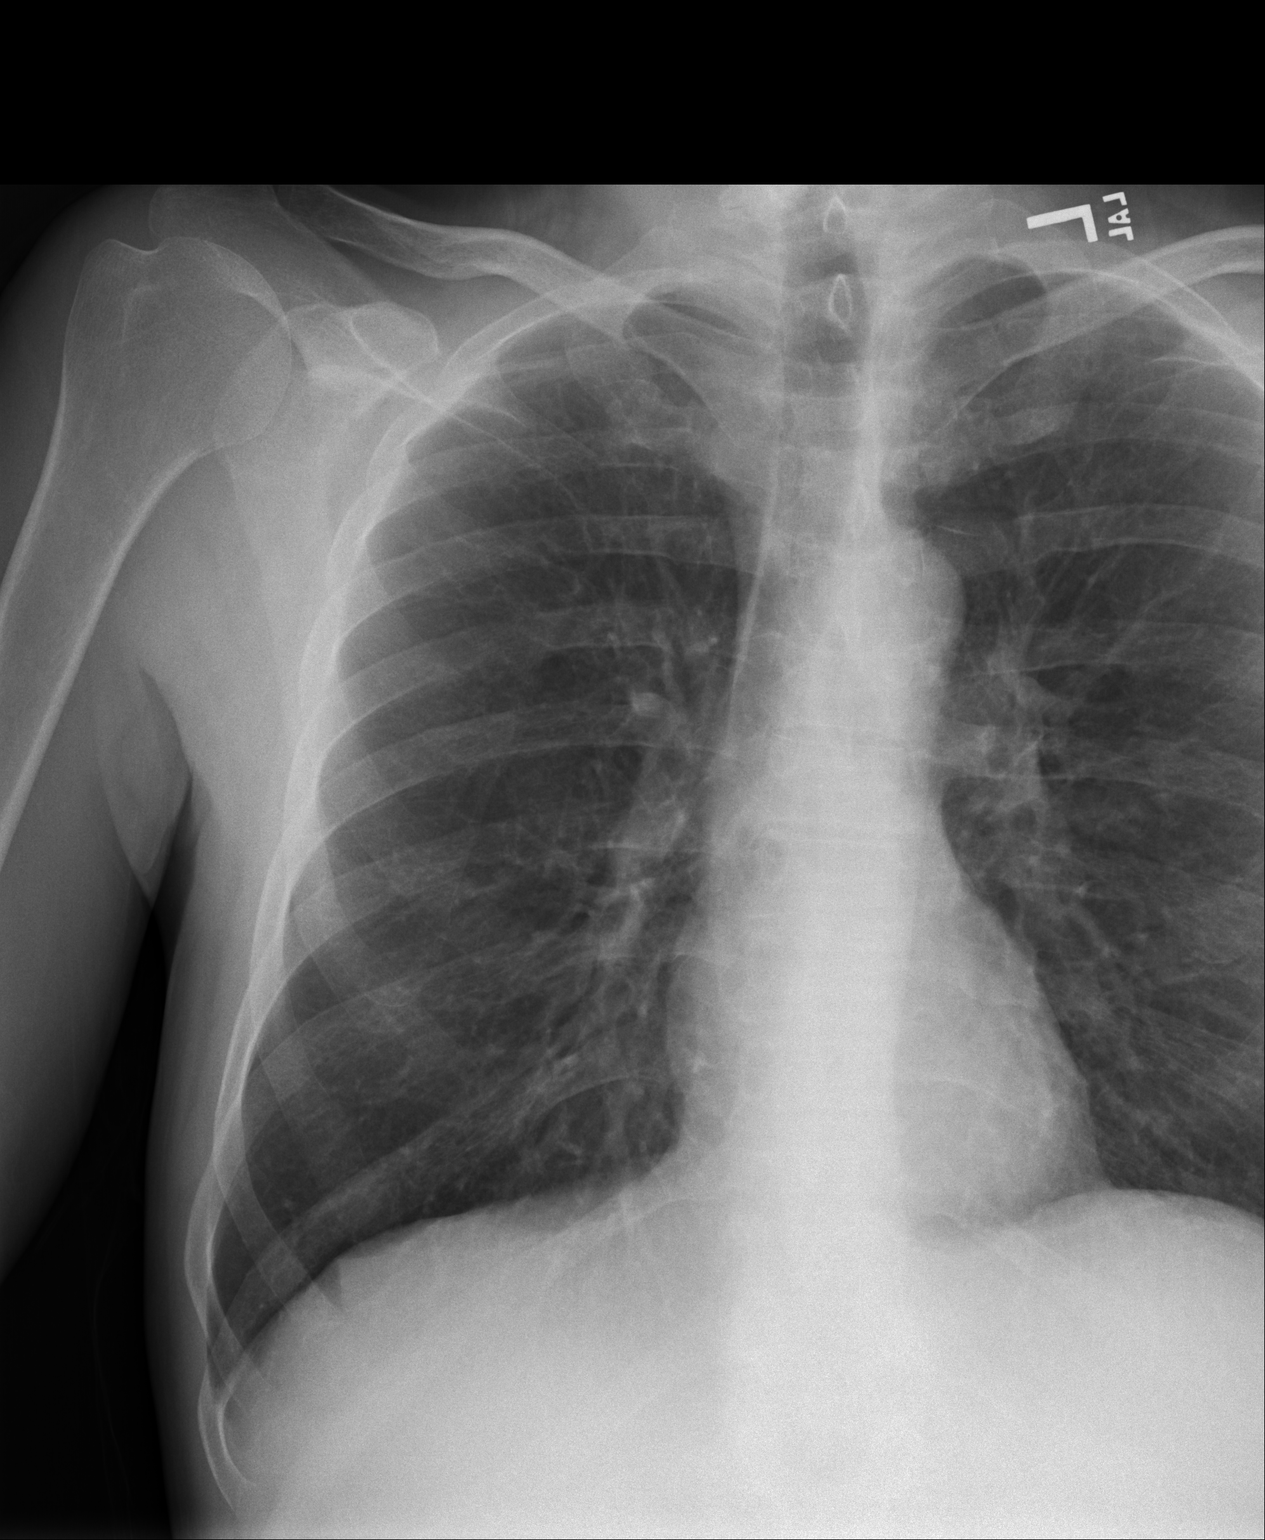
[im 3/3]
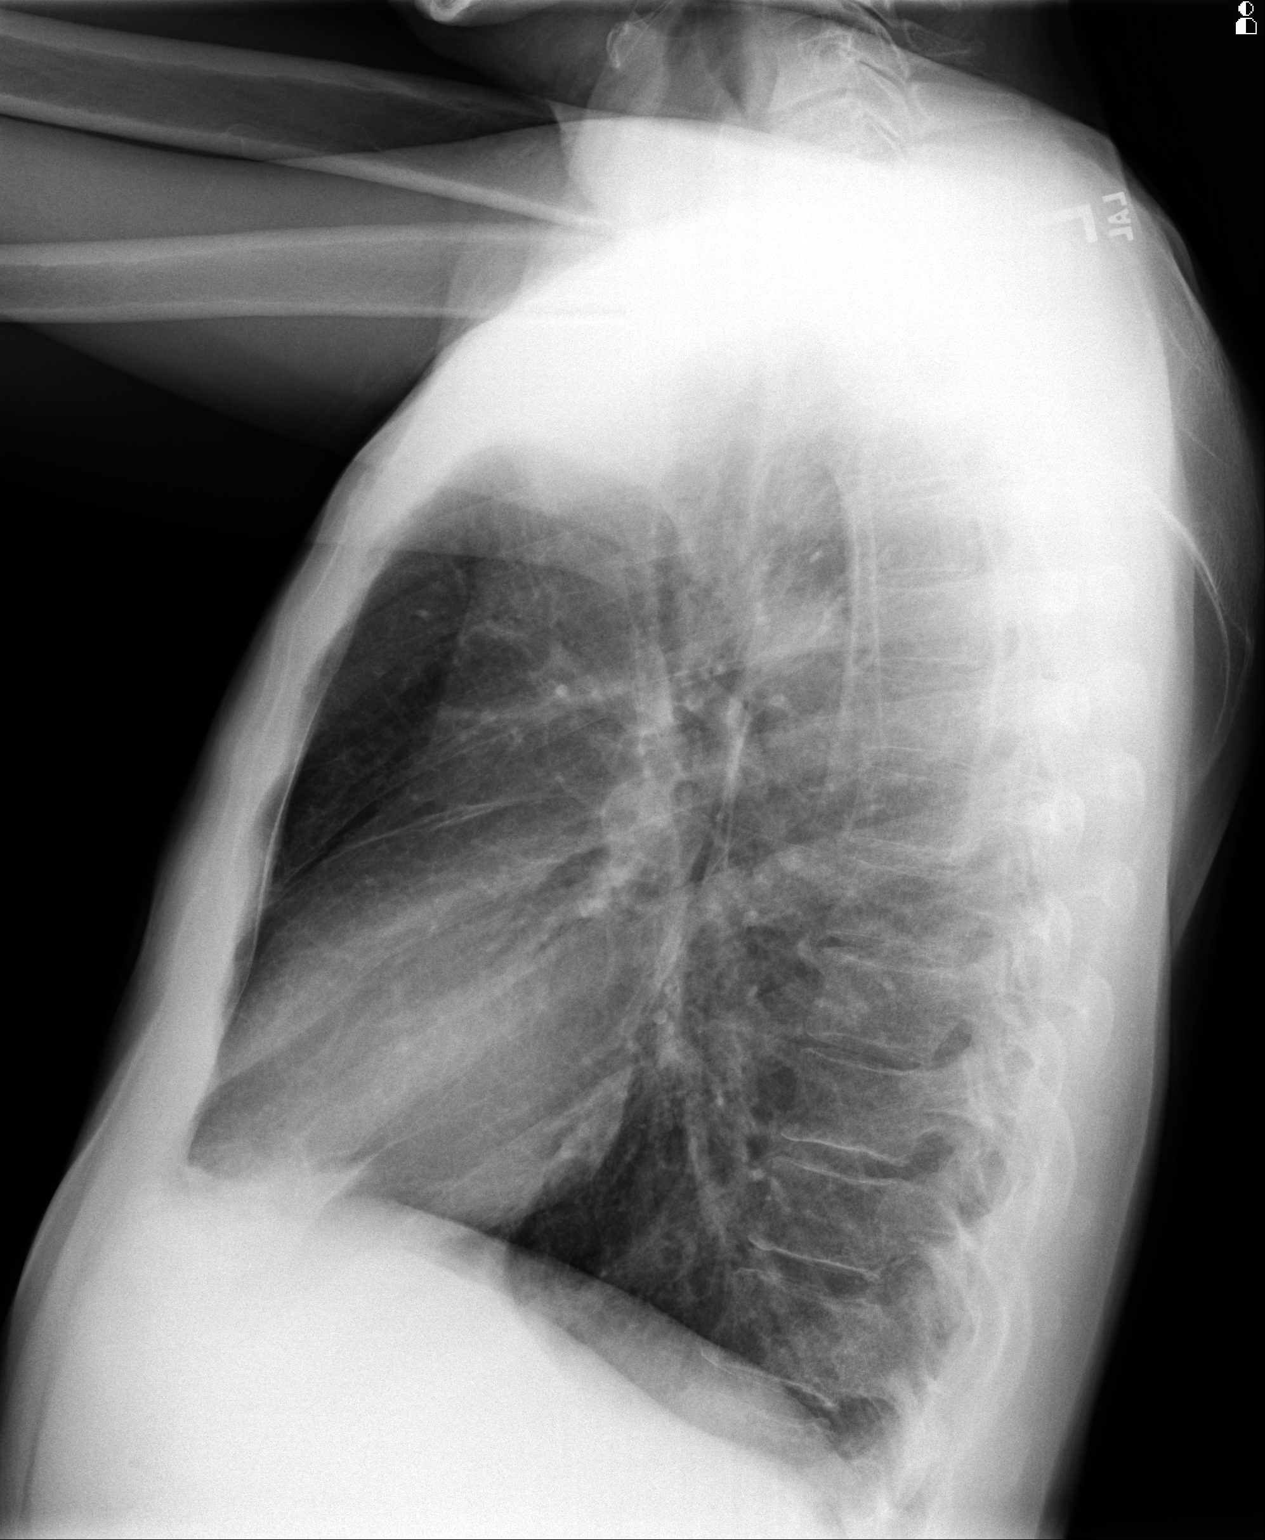

[3 of 3 positions shown; findings below may reference images not displayed]

IMPRESSION: No acute abnormality. Chest stable from 05/05/2012.

## 2015-05-02 IMAGING — XA IR NEPHROSTOMY
3 series · 3 of 3 positions shown · non-contrast
Comparison: none

REASON FOR EXAM: Nephrostomy tube removal under floroscopy due to stent.
COMMENTS:

[Series 1: single · 1 of 1 slices shown (1 of 3)]
[im 1/1]
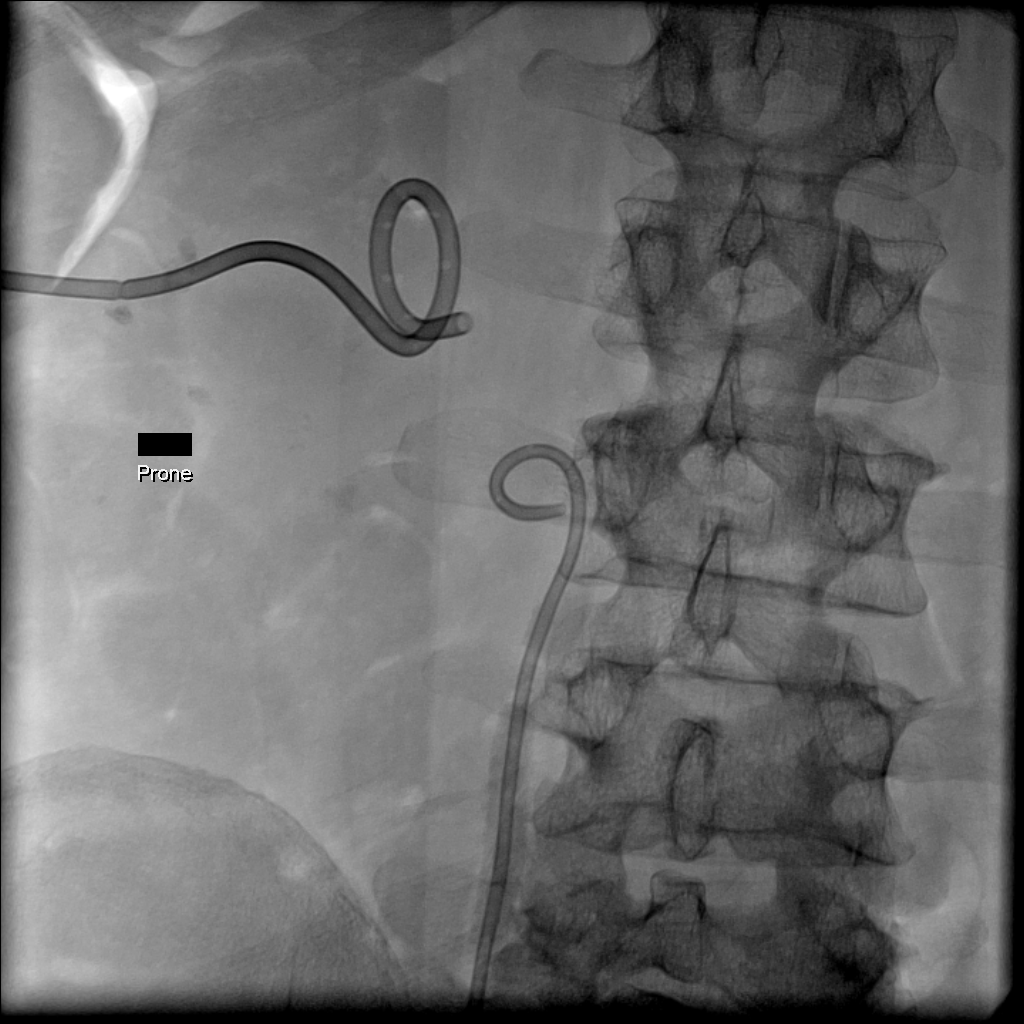

[Series 2: single · 1 of 1 slices shown (2 of 3)]
[im 1/1]
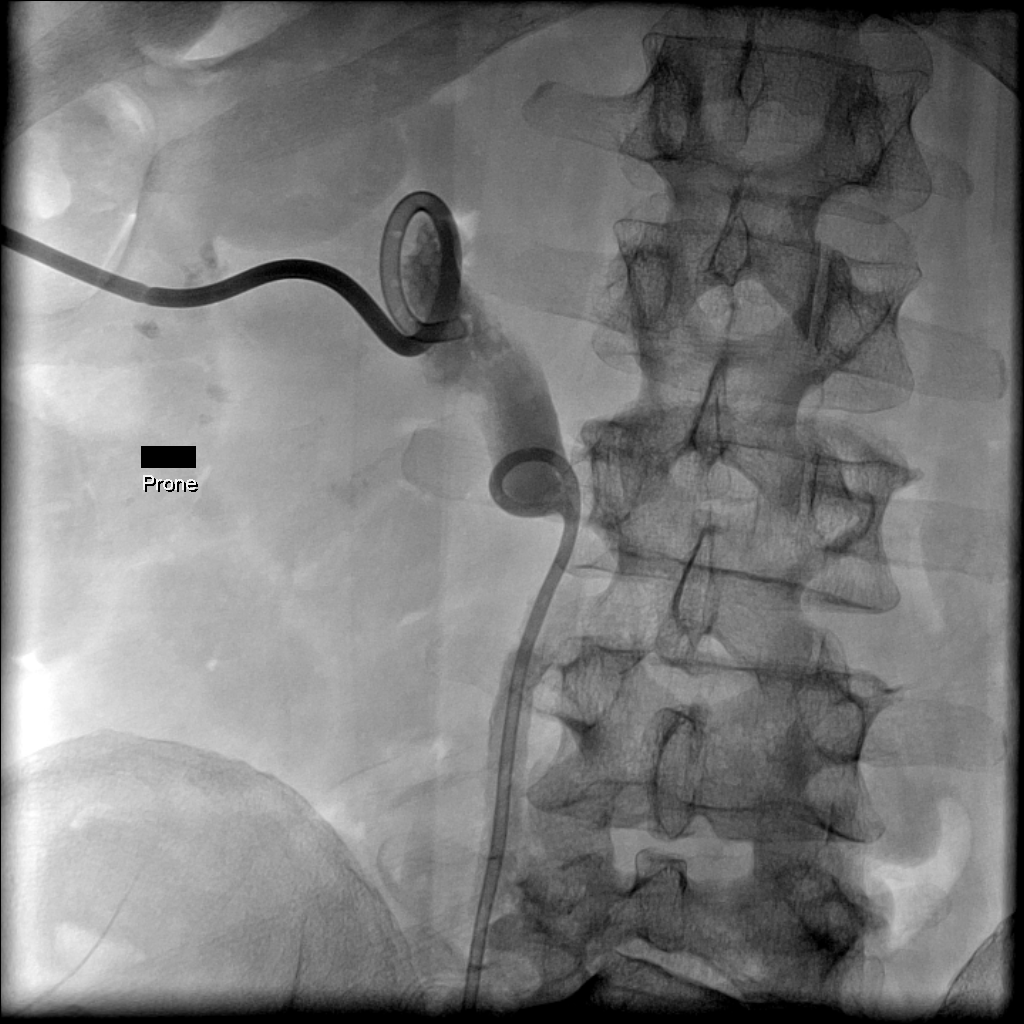

[Series 3: single · 1 of 1 slices shown (3 of 3)]
[im 1/1]
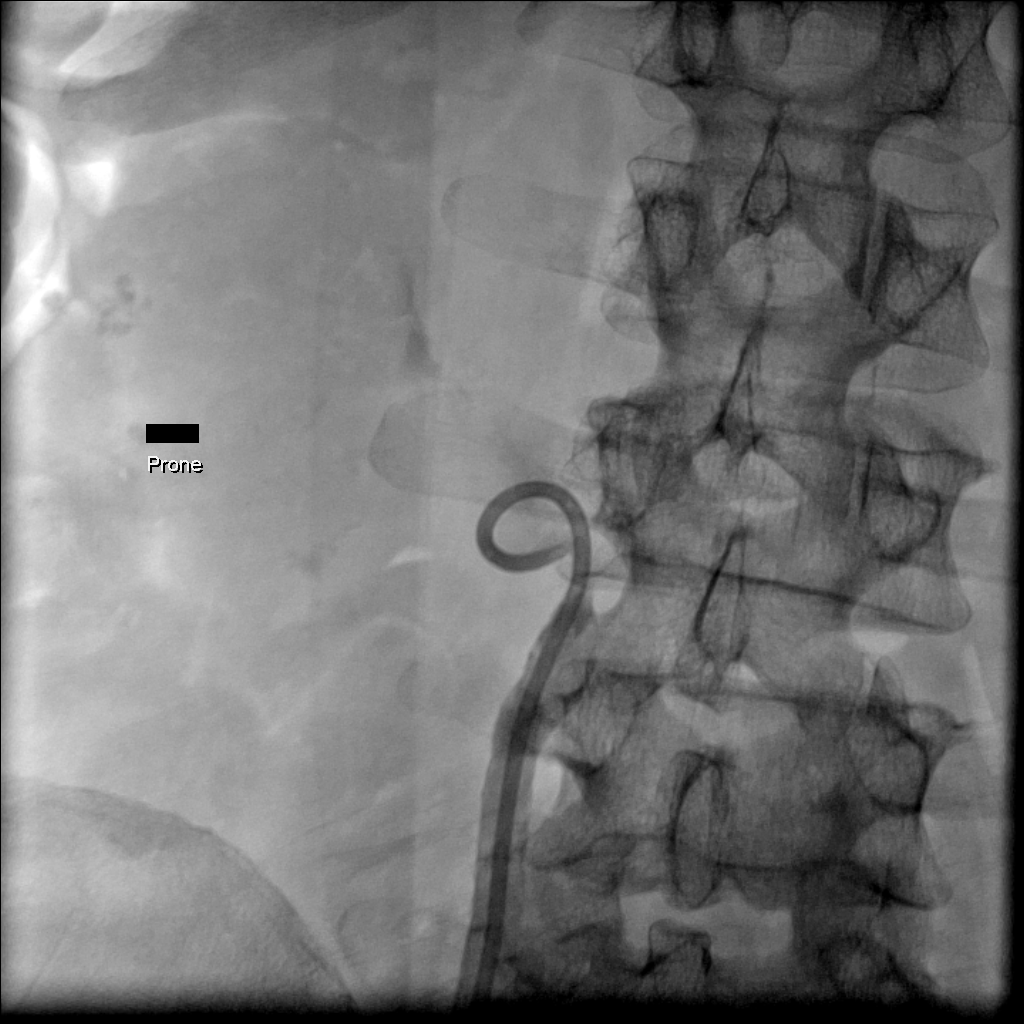

[3 of 3 positions shown; findings below may reference images not displayed]

PROCEDURE:     VAS - NEPHROSTOGRAM  - October 07, 2012 [DATE]

RESULT:

Procedure:  The patient was informed of the risks and benefits of the
procedure and proper informed consent was obtained.

The patient was brought to the fluoroscopy suite and placed in a prone
position. The left flank area was evaluated. A percutaneous nephrostomy tube
is appreciated. The site was then prepped and draped in the usual sterile
fashion. Nephrostomy tube was injected with a mixture of 50% radiopaque
contrast and sterile saline. Contrast is appreciated draining into the
ureteral stent as well as into the ureter. An Amplatz guidewire was placed
into the nephrostomy tube. The tube was removed. Pressure was held at the
insertion site of the tube. There is no evidence of active hemorrhage or
significant urine drainage. Sterile dressing was placed at the site. The
patient tolerated the procedure without complications. Confirmatory overhead
spot imaging was obtained.
IMPRESSION: Nephrostomy tube removal as described above.

## 2015-05-06 ENCOUNTER — Encounter: Payer: Self-pay | Admitting: Emergency Medicine

## 2015-05-06 ENCOUNTER — Inpatient Hospital Stay
Admission: EM | Admit: 2015-05-06 | Discharge: 2015-05-14 | DRG: 698 | Disposition: A | Discharge status: Skilled Nursing Facility | Payer: Medicare Other | Attending: Internal Medicine | Admitting: Internal Medicine

## 2015-05-06 DIAGNOSIS — J449 Chronic obstructive pulmonary disease, unspecified: Secondary | ICD-10-CM | POA: Diagnosis present

## 2015-05-06 DIAGNOSIS — N12 Tubulo-interstitial nephritis, not specified as acute or chronic: Secondary | ICD-10-CM | POA: Diagnosis present

## 2015-05-06 DIAGNOSIS — Y939 Activity, unspecified: Secondary | ICD-10-CM

## 2015-05-06 DIAGNOSIS — N179 Acute kidney failure, unspecified: Secondary | ICD-10-CM

## 2015-05-06 DIAGNOSIS — Z905 Acquired absence of kidney: Secondary | ICD-10-CM

## 2015-05-06 DIAGNOSIS — N319 Neuromuscular dysfunction of bladder, unspecified: Secondary | ICD-10-CM | POA: Diagnosis present

## 2015-05-06 DIAGNOSIS — Z95828 Presence of other vascular implants and grafts: Secondary | ICD-10-CM

## 2015-05-06 DIAGNOSIS — Y929 Unspecified place or not applicable: Secondary | ICD-10-CM | POA: Diagnosis not present

## 2015-05-06 DIAGNOSIS — N3289 Other specified disorders of bladder: Secondary | ICD-10-CM | POA: Diagnosis present

## 2015-05-06 DIAGNOSIS — A419 Sepsis, unspecified organism: Secondary | ICD-10-CM | POA: Diagnosis present

## 2015-05-06 DIAGNOSIS — Y999 Unspecified external cause status: Secondary | ICD-10-CM | POA: Diagnosis not present

## 2015-05-06 DIAGNOSIS — N170 Acute kidney failure with tubular necrosis: Secondary | ICD-10-CM | POA: Diagnosis not present

## 2015-05-06 DIAGNOSIS — N183 Chronic kidney disease, stage 3 (moderate): Secondary | ICD-10-CM | POA: Diagnosis present

## 2015-05-06 DIAGNOSIS — N390 Urinary tract infection, site not specified: Secondary | ICD-10-CM | POA: Diagnosis present

## 2015-05-06 DIAGNOSIS — J45909 Unspecified asthma, uncomplicated: Secondary | ICD-10-CM | POA: Diagnosis present

## 2015-05-06 DIAGNOSIS — I129 Hypertensive chronic kidney disease with stage 1 through stage 4 chronic kidney disease, or unspecified chronic kidney disease: Secondary | ICD-10-CM | POA: Diagnosis present

## 2015-05-06 DIAGNOSIS — E871 Hypo-osmolality and hyponatremia: Secondary | ICD-10-CM | POA: Diagnosis present

## 2015-05-06 DIAGNOSIS — E872 Acidosis: Secondary | ICD-10-CM | POA: Diagnosis not present

## 2015-05-06 DIAGNOSIS — X58XXXA Exposure to other specified factors, initial encounter: Secondary | ICD-10-CM | POA: Diagnosis present

## 2015-05-06 DIAGNOSIS — Z1624 Resistance to multiple antibiotics: Secondary | ICD-10-CM | POA: Diagnosis present

## 2015-05-06 DIAGNOSIS — Z452 Encounter for adjustment and management of vascular access device: Secondary | ICD-10-CM

## 2015-05-06 DIAGNOSIS — F200 Paranoid schizophrenia: Secondary | ICD-10-CM | POA: Diagnosis present

## 2015-05-06 DIAGNOSIS — F329 Major depressive disorder, single episode, unspecified: Secondary | ICD-10-CM | POA: Diagnosis present

## 2015-05-06 DIAGNOSIS — E039 Hypothyroidism, unspecified: Secondary | ICD-10-CM | POA: Diagnosis present

## 2015-05-06 DIAGNOSIS — T83518A Infection and inflammatory reaction due to other urinary catheter, initial encounter: Principal | ICD-10-CM | POA: Diagnosis present

## 2015-05-06 DIAGNOSIS — K623 Rectal prolapse: Secondary | ICD-10-CM | POA: Diagnosis present

## 2015-05-06 DIAGNOSIS — F17210 Nicotine dependence, cigarettes, uncomplicated: Secondary | ICD-10-CM | POA: Diagnosis present

## 2015-05-06 DIAGNOSIS — E440 Moderate protein-calorie malnutrition: Secondary | ICD-10-CM | POA: Diagnosis present

## 2015-05-06 DIAGNOSIS — R6521 Severe sepsis with septic shock: Secondary | ICD-10-CM | POA: Diagnosis present

## 2015-05-06 DIAGNOSIS — B9620 Unspecified Escherichia coli [E. coli] as the cause of diseases classified elsewhere: Secondary | ICD-10-CM | POA: Diagnosis present

## 2015-05-06 DIAGNOSIS — R109 Unspecified abdominal pain: Secondary | ICD-10-CM | POA: Diagnosis present

## 2015-05-06 LAB — COMPREHENSIVE METABOLIC PANEL
ALK PHOS: 106 U/L (ref 38–126)
ALT: 20 U/L (ref 17–63)
AST: 26 U/L (ref 15–41)
Albumin: 3.8 g/dL (ref 3.5–5.0)
Anion gap: 15 (ref 5–15)
BILIRUBIN TOTAL: 0.4 mg/dL (ref 0.3–1.2)
BUN: 34 mg/dL — AB (ref 6–20)
CALCIUM: 9.7 mg/dL (ref 8.9–10.3)
CO2: 18 mmol/L — ABNORMAL LOW (ref 22–32)
CREATININE: 2.54 mg/dL — AB (ref 0.61–1.24)
Chloride: 109 mmol/L (ref 101–111)
GFR, EST AFRICAN AMERICAN: 30 mL/min — AB (ref >60)
GFR, EST NON AFRICAN AMERICAN: 26 mL/min — AB (ref >60)
Glucose, Bld: 112 mg/dL — ABNORMAL HIGH (ref 65–99)
Potassium: 4.5 mmol/L (ref 3.5–5.1)
Sodium: 142 mmol/L (ref 135–145)
Total Protein: 7.2 g/dL (ref 6.5–8.1)

## 2015-05-06 LAB — URINALYSIS COMPLETE WITH MICROSCOPIC (ARMC ONLY)
BILIRUBIN URINE: NEGATIVE
GLUCOSE, UA: NEGATIVE mg/dL
KETONES UR: NEGATIVE mg/dL
Nitrite: POSITIVE — AB
PH: 9 — AB (ref 5.0–8.0)
Protein, ur: 100 mg/dL — AB
Specific Gravity, Urine: 1.009 (ref 1.005–1.030)
Squamous Epithelial / LPF: NONE SEEN

## 2015-05-06 LAB — CBC
HCT: 43.6 % (ref 40.0–52.0)
Hemoglobin: 14.1 g/dL (ref 13.0–18.0)
MCH: 29.7 pg (ref 26.0–34.0)
MCHC: 32.4 g/dL (ref 32.0–36.0)
MCV: 91.7 fL (ref 80.0–100.0)
PLATELETS: 223 10*3/uL (ref 150–440)
RBC: 4.75 MIL/uL (ref 4.40–5.90)
RDW: 14.7 % — AB (ref 11.5–14.5)
WBC: 26.3 10*3/uL — AB (ref 3.8–10.6)

## 2015-05-06 LAB — LACTIC ACID, PLASMA
Lactic Acid, Venous: 2.2 mmol/L (ref 0.5–2.0)
Lactic Acid, Venous: 1.2 mmol/L (ref 0.5–2.0)

## 2015-05-06 LAB — LIPASE, BLOOD: Lipase: 30 U/L (ref 11–51)

## 2015-05-06 MED ORDER — FLUOXETINE HCL 20 MG PO CAPS
40.0000 mg | ORAL_CAPSULE | Freq: Every day | ORAL | Status: DC
  Administered 2015-05-06 – 2015-05-14 (×9): 40 mg via ORAL
  Filled 2015-05-06 (×11): qty 2

## 2015-05-06 MED ORDER — RISPERIDONE 0.5 MG PO TABS: 0.5000 mg | ORAL_TABLET | Freq: Every day | ORAL | Status: DC

## 2015-05-06 MED ORDER — MOMETASONE FURO-FORMOTEROL FUM 100-5 MCG/ACT IN AERO
2.0000 | INHALATION_SPRAY | Freq: Two times a day (BID) | RESPIRATORY_TRACT | Status: DC
  Administered 2015-05-06 – 2015-05-14 (×16): 2 via RESPIRATORY_TRACT
  Filled 2015-05-06: qty 8.8

## 2015-05-06 MED ORDER — HYDROMORPHONE HCL 1 MG/ML IJ SOLN
0.5000 mg | INTRAMUSCULAR | Status: AC | PRN
  Administered 2015-05-06 (×2): 0.5 mg via INTRAVENOUS
  Filled 2015-05-06 (×2): qty 1

## 2015-05-06 MED ORDER — ACETAMINOPHEN 650 MG RE SUPP: 650.0000 mg | Freq: Four times a day (QID) | RECTAL | Status: DC | PRN

## 2015-05-06 MED ORDER — DEXTROSE 5 % IV SOLN: 1.0000 g | Freq: Once | INTRAVENOUS | Status: DC

## 2015-05-06 MED ORDER — SODIUM CHLORIDE 0.9 % IV BOLUS (SEPSIS)
1000.0000 mL | Freq: Once | INTRAVENOUS | Status: AC
  Administered 2015-05-06: 1000 mL via INTRAVENOUS

## 2015-05-06 MED ORDER — PIPERACILLIN-TAZOBACTAM 3.375 G IVPB
3.3750 g | Freq: Once | INTRAVENOUS | Status: DC
  Filled 2015-05-06: qty 50

## 2015-05-06 MED ORDER — SODIUM CHLORIDE 0.9 % IV SOLN
1.0000 g | Freq: Once | INTRAVENOUS | Status: AC
  Administered 2015-05-06: 1 g via INTRAVENOUS
  Filled 2015-05-06: qty 1

## 2015-05-06 MED ORDER — DARIFENACIN HYDROBROMIDE ER 7.5 MG PO TB24
7.5000 mg | ORAL_TABLET | Freq: Every day | ORAL | Status: DC
  Administered 2015-05-06 – 2015-05-14 (×9): 7.5 mg via ORAL
  Filled 2015-05-06 (×9): qty 1

## 2015-05-06 MED ORDER — ENOXAPARIN SODIUM 40 MG/0.4ML ~~LOC~~ SOLN
40.0000 mg | SUBCUTANEOUS | Status: DC
  Administered 2015-05-06: 40 mg via SUBCUTANEOUS
  Filled 2015-05-06: qty 0.4

## 2015-05-06 MED ORDER — ONDANSETRON HCL 4 MG PO TABS: 4.0000 mg | ORAL_TABLET | Freq: Four times a day (QID) | ORAL | Status: DC | PRN

## 2015-05-06 MED ORDER — TAMSULOSIN HCL 0.4 MG PO CAPS
0.4000 mg | ORAL_CAPSULE | Freq: Every day | ORAL | Status: DC
  Administered 2015-05-06 – 2015-05-14 (×9): 0.4 mg via ORAL
  Filled 2015-05-06 (×9): qty 1

## 2015-05-06 MED ORDER — FENTANYL CITRATE (PF) 100 MCG/2ML IJ SOLN: 50.0000 ug | Freq: Once | INTRAMUSCULAR | Status: DC

## 2015-05-06 MED ORDER — LEVOTHYROXINE SODIUM 50 MCG PO TABS
50.0000 ug | ORAL_TABLET | Freq: Every day | ORAL | Status: DC
  Administered 2015-05-07 – 2015-05-14 (×8): 50 ug via ORAL
  Filled 2015-05-06: qty 2
  Filled 2015-05-06 (×4): qty 1
  Filled 2015-05-06: qty 2
  Filled 2015-05-06: qty 1
  Filled 2015-05-06: qty 2

## 2015-05-06 MED ORDER — SODIUM CHLORIDE 0.9 % IV SOLN
INTRAVENOUS | Status: DC
  Administered 2015-05-06 – 2015-05-08 (×3): via INTRAVENOUS

## 2015-05-06 MED ORDER — ONDANSETRON HCL 4 MG/2ML IJ SOLN: 4.0000 mg | Freq: Four times a day (QID) | INTRAMUSCULAR | Status: DC | PRN

## 2015-05-06 MED ORDER — RISPERIDONE 3 MG PO TABS
3.0000 mg | ORAL_TABLET | Freq: Every day | ORAL | Status: DC
  Administered 2015-05-06 – 2015-05-13 (×8): 3 mg via ORAL
  Filled 2015-05-06 (×7): qty 1
  Filled 2015-05-06: qty 6

## 2015-05-06 MED ORDER — ACETAMINOPHEN 325 MG PO TABS
650.0000 mg | ORAL_TABLET | Freq: Four times a day (QID) | ORAL | Status: DC | PRN
  Administered 2015-05-07 – 2015-05-14 (×20): 650 mg via ORAL
  Filled 2015-05-06 (×20): qty 2

## 2015-05-06 MED ORDER — SODIUM CHLORIDE 0.9 % IV SOLN
1.0000 g | INTRAVENOUS | Status: DC
Start: 2015-05-06 — End: 2015-05-07
  Administered 2015-05-06: 1 g via INTRAVENOUS
  Filled 2015-05-06 (×2): qty 1

## 2015-05-06 MED ORDER — HYDROMORPHONE HCL 1 MG/ML IJ SOLN
1.0000 mg | Freq: Once | INTRAMUSCULAR | Status: AC
  Administered 2015-05-06: 1 mg via INTRAVENOUS
  Filled 2015-05-06: qty 1

## 2015-05-06 MED ORDER — HYDROMORPHONE HCL 1 MG/ML IJ SOLN
1.0000 mg | INTRAMUSCULAR | Status: DC | PRN
  Administered 2015-05-07: 1 mg via INTRAVENOUS
  Filled 2015-05-06: qty 1

## 2015-05-06 NOTE — ED Notes (Signed)
States he developed lower abd pain ..and having some diff with urination  Denies any n/v or fever

## 2015-05-06 NOTE — Progress Notes (Signed)
Blood culture came in positive for anaerobic bottle only and gram  negative rods. Dr. Lavetta Nielsen notified and he stated that sine the patient has already received Invanz ABT that should cover that. So no new order received. Will continue to monitor.

## 2015-05-06 NOTE — H&P (Addendum)
[facility] NAME: Alan Small    MR#:  [id number]  DATE OF BIRTH:  [birth year]  DATE OF ADMISSION:  05/06/2015  PRIMARY CARE PHYSICIAN: [name], MD   REQUESTING/REFERRING PHYSICIAN: Dr. Lisa Roca  CHIEF COMPLAINT:   Chief Complaint  Patient presents with  . Abdominal Pain    HISTORY OF PRESENT ILLNESS:  Alan Small  is a 59 y.o. male with a known history of paranoid schizophrenia, seizures, hypothyroidism, asthma, history of previous MI, depression, history of multidrug resistant UTI, chronic indwelling Foley catheter who presents to the hospital from a family care home due to lower abdominal pain. Patient describes his pain as crampy in nature located in the lower part of his abdomen. He denies any fevers chills, nausea, vomiting, diarrhea. He has a chronic indwelling Foley and therefore his family care home sent him over here for possible evaluation of UTI. Patient was noted to be tachycardic, and noted to have a leukocytosis and suspected to have sepsis secondary to UTI and therefore hospitalist services were contacted further treatment and evaluation.  PAST MEDICAL HISTORY:   Past Medical History  Diagnosis Date  . Mental disorder   . Seizures ([place])   . Myocardial infarction ([place])   . Stroke ([place])   . Asthma   . Paranoid schizophrenia ([place])   . Gastric ulcer   . External hemorrhoids   . Hypothyroidism     hypo  . Insomnia   . Depression   . Renal disorder     renal failure  . Hydronephrosis     PAST SURGICAL HISTORY:   Past Surgical History  Procedure Laterality Date  . Appendectomy    . Colonoscopy with propofol N/A 12/07/2014    Procedure: COLONOSCOPY WITH PROPOFOL;  Surgeon: Manya Silvas, MD;  Location: [facility] ENDOSCOPY;  Service: Endoscopy;  Laterality: N/A;  . Esophagogastroduodenoscopy N/A 12/07/2014    Procedure: ESOPHAGOGASTRODUODENOSCOPY (EGD);  Surgeon: Manya Silvas, MD;   Location: [facility] ENDOSCOPY;  Service: Endoscopy;  Laterality: N/A;  . Nephrectomy      SOCIAL HISTORY:   Social History  Substance Use Topics  . Smoking status: Current Every Day Smoker -- 1.00 packs/day for 55 years    Types: Cigarettes  . Smokeless tobacco: Former Systems developer    Types: Chew  . Alcohol Use: No    FAMILY HISTORY:   Family History  Problem Relation Age of Onset  . Stroke Father   . Pneumonia Father   . Brain cancer Mother     DRUG ALLERGIES:   Allergies  Allergen Reactions  . Mellaril [Thioridazine] Other (See Comments)    Unknown   . Morphine And Related Itching  . Navane [Thiothixene] Other (See Comments)    GI Distress  . Thorazine [Chlorpromazine] Other (See Comments)    Unknown    REVIEW OF SYSTEMS:   Review of Systems  Constitutional: Negative for fever and weight loss.  HENT: Negative for congestion, nosebleeds and tinnitus.   Eyes: Negative for blurred vision, double vision and redness.  Respiratory: Negative for cough, hemoptysis and shortness of breath.   Cardiovascular: Negative for chest pain, orthopnea, leg swelling and PND.  Gastrointestinal: Positive for abdominal pain (lower abdomen). Negative for nausea, vomiting, diarrhea and melena.  Genitourinary: Negative for dysuria, urgency and hematuria.  Musculoskeletal: Negative for joint pain and falls.  Neurological: Negative for dizziness, tingling, sensory change, focal weakness, seizures, weakness and headaches.  Endo/Heme/Allergies: Negative for polydipsia.  Does not bruise/bleed easily.  Psychiatric/Behavioral: Negative for depression and memory loss. The patient is not nervous/anxious.     MEDICATIONS AT HOME:   Prior to Admission medications   Medication Sig Start Date End Date Taking? Authorizing Provider  albuterol-ipratropium (COMBIVENT) 18-103 MCG/ACT inhaler Inhale 2 puffs into the lungs every 6 (six) hours as needed for wheezing.    Historical Provider, MD  cephALEXin (KEFLEX)  500 MG capsule Take 1 capsule (500 mg total) by mouth 4 (four) times daily. 01/22/15   Epifanio Lesches, MD  diphenhydrAMINE (BENADRYL) 50 MG capsule Take 50 mg by mouth at bedtime.    Historical Provider, MD  FLUoxetine (PROZAC) 40 MG capsule Take 40 mg by mouth daily.    Historical Provider, MD  Fluticasone-Salmeterol (ADVAIR) 250-50 MCG/DOSE AEPB Inhale 1 puff into the lungs 2 (two) times daily.    Historical Provider, MD  hydrocortisone (ANUSOL-HC) 25 MG suppository Place 25 mg rectally 3 (three) times daily as needed for hemorrhoids or itching.    Historical Provider, MD  levothyroxine (SYNTHROID, LEVOTHROID) 50 MCG tablet Take 50 mcg by mouth daily before breakfast.    Historical Provider, MD  nystatin (MYCOSTATIN) 100000 UNIT/ML suspension Take 5 mLs (500,000 Units total) by mouth 4 (four) times daily. 01/22/15   Epifanio Lesches, MD  risperiDONE (RISPERDAL) 0.5 MG tablet Take 0.5 mg by mouth at bedtime.    Historical Provider, MD  risperiDONE (RISPERDAL) 3 MG tablet Take 3 mg by mouth at bedtime.    Historical Provider, MD  sodium bicarbonate 650 MG tablet Take 2 tablets (1,300 mg total) by mouth 2 (two) times daily. Patient taking differently: Take 650 mg by mouth 2 (two) times daily.  11/11/12   Nita Sells, MD  solifenacin (VESICARE) 5 MG tablet Take 5 mg by mouth daily.    Historical Provider, MD  sulfamethoxazole-trimethoprim (BACTRIM DS,SEPTRA DS) 800-160 MG per tablet Take 1 tablet by mouth 2 (two) times daily. 02/22/15   Nance Pear, MD  sulfamethoxazole-trimethoprim (BACTRIM,SEPTRA) 400-80 MG tablet Take 1 tablet by mouth 2 (two) times daily.    Historical Provider, MD  tamsulosin (FLOMAX) 0.4 MG CAPS capsule Take 0.4 mg by mouth daily.    Historical Provider, MD      VITAL SIGNS:  Blood pressure 168/94, pulse 94, temperature 98.7 F (37.1 C), temperature source Oral, resp. rate 22, height 6' (1.829 m), weight 70.761 kg (156 lb), SpO2 98 %.  PHYSICAL EXAMINATION:   Physical Exam  GENERAL:  59 y.o.-year-old patient lying in the bed restless and in mild distress EYES: Pupils equal, round, reactive to light and accommodation. No scleral icterus. Extraocular muscles intact.  HEENT: Head atraumatic, normocephalic. Oropharynx and nasopharynx clear. No oropharyngeal erythema, moist oral mucosa  NECK:  Supple, no jugular venous distention. No thyroid enlargement, no tenderness.  LUNGS: Normal breath sounds bilaterally, no wheezing, rales, rhonchi. No use of accessory muscles of respiration.  CARDIOVASCULAR: S1, S2 RRR. No murmurs, rubs, gallops, clicks.  ABDOMEN: Soft, Tender in the lower abdomen but no rebound, rigidity, nondistended. Bowel sounds present. No organomegaly or mass.  EXTREMITIES: No pedal edema, cyanosis, or clubbing. + 2 pedal & radial pulses b/l.   NEUROLOGIC: Cranial nerves II through XII are intact. No focal Motor or sensory deficits appreciated b/l PSYCHIATRIC: The patient is alert and oriented x 3. Good affect.  SKIN: No obvious rash, lesion, or ulcer.   Chronic indwelling Foley catheter in place clear urine draining.  LABORATORY PANEL:   CBC  Recent Labs Lab  05/06/15 1057  WBC 26.3*  HGB 14.1  HCT 43.6  PLT 223   ------------------------------------------------------------------------------------------------------------------  Chemistries   Recent Labs Lab 05/06/15 1057  NA 142  K 4.5  CL 109  CO2 18*  GLUCOSE 112*  BUN 34*  CREATININE 2.54*  CALCIUM 9.7  AST 26  ALT 20  ALKPHOS 106  BILITOT 0.4   ------------------------------------------------------------------------------------------------------------------  Cardiac Enzymes No results for input(s): TROPONINI in the last 168 hours. ------------------------------------------------------------------------------------------------------------------  RADIOLOGY:  No results found.   IMPRESSION AND PLAN:   59 year old male with past medical history of  paranoid schizophrenia, hypothyroidism, history of chronic indwelling Foley catheter, history of multidrug resistant UTI, depression who presents to the hospital due to abdominal pain and noted to have sepsis secondary to UTI.  #1 sepsis-patient meets criteria given his tachycardia, leukocytosis elevated lactic acid and abnormal urinalysis. -Continue IV fluids, patient has a history of multidrug resistant UTI and therefore will be started on IV Invanz. -I will get a infectious disease consult, follow blood, urine cultures. -Follow clinically.  #2 urinary tract infection-patient is high risk given his chronic indwelling Foley. -Patient has a history of a multidrug resistant Escherichia coli, Acinetobacter UTI. I will place the patient on IV Invanz. Get infectious disease consult. Will follow urine cultures.  #3 Leucocytosis-likely secondary to the UTI and sepsis. -Follow white cell count with IV antibiotic therapy.  #4 hypothyroidism-continue Synthroid.  #5 asthma/COPD-no acute exacerbation. -Continue Dulera.  #6 history of peritonitis schizophrenia.-Continue Risperdal.   All the records are reviewed and case discussed with ED provider. Management plans discussed with the patient, family and they are in agreement.  CODE STATUS: Full  TOTAL TIME TAKING CARE OF THIS PATIENT: 45 minutes.    Henreitta Leber M.D on 05/06/2015 at 2:17 PM  Between 7am to 6pm - Pager - [phone]  After 6pm go to [email or web address] - password [email or web address] [facility]  Office  [phone]  CC: Primary care physician; [name], MD

## 2015-05-06 NOTE — ED Notes (Signed)
Dr. Verdell Carmine notified of pt's HR being in the 130-140's. Per Dr. Verdell Carmine pt to move to 2A, no new orders received about pt's HR at this time.

## 2015-05-06 NOTE — ED Provider Notes (Addendum)
[facility] Emergency Department Provider Note   ____________________________________________  Time seen: 1245 I have reviewed the triage vital signs and the triage nursing note.  HISTORY  Chief Complaint Abdominal Pain   Historian Patient  HPI Alan Small is a 59 y.o. male who is here for evaluation of suprapubic abdominal pain and left flank pain since yesterday. Constant and worsening. Feels similar to prior urinary tract infection. Symptoms are moderate. No fever. No coughing. No altered mental status. No dizziness or passing out. No additional alleviating or aggravating factors.    Past Medical History  Diagnosis Date  . Mental disorder   . Seizures ([place])   . Myocardial infarction ([place])   . Stroke ([place])   . Asthma   . Paranoid schizophrenia ([place])   . Gastric ulcer   . External hemorrhoids   . Hypothyroidism     hypo  . Insomnia   . Depression   . Renal disorder     renal failure  . Hydronephrosis     Patient Active Problem List   Diagnosis Date Noted  . UTI (lower urinary tract infection) 01/17/2015  . Hypoglycemia 01/17/2015  . Metabolic acidosis 0000000  . Acute renal failure ([place]) 11/03/2012  . Hyperkalemia 11/03/2012  . Pyelonephritis 11/03/2012  . Schizophrenia ([place]) 11/03/2012  . Hypothyroidism 11/03/2012  . Severe sepsis ([place]) 11/03/2012  . Protein-calorie malnutrition, severe ([place]) 11/03/2012  . Encephalopathy acute 11/03/2012    Past Surgical History  Procedure Laterality Date  . Appendectomy    . Colonoscopy with propofol N/A 12/07/2014    Procedure: COLONOSCOPY WITH PROPOFOL;  Surgeon: Manya Silvas, MD;  Location: [facility] ENDOSCOPY;  Service: Endoscopy;  Laterality: N/A;  . Esophagogastroduodenoscopy N/A 12/07/2014    Procedure: ESOPHAGOGASTRODUODENOSCOPY (EGD);  Surgeon: Manya Silvas, MD;  Location: [facility] ENDOSCOPY;  Service: Endoscopy;  Laterality: N/A;  . Nephrectomy      Current Outpatient Rx  Name   Route  Sig  Dispense  Refill  . albuterol-ipratropium (COMBIVENT) 18-103 MCG/ACT inhaler   Inhalation   Inhale 2 puffs into the lungs every 6 (six) hours as needed for wheezing.         . cephALEXin (KEFLEX) 500 MG capsule   Oral   Take 1 capsule (500 mg total) by mouth 4 (four) times daily.   40 capsule   0   . diphenhydrAMINE (BENADRYL) 50 MG capsule   Oral   Take 50 mg by mouth at bedtime.         [name] FLUoxetine (PROZAC) 40 MG capsule   Oral   Take 40 mg by mouth daily.         . Fluticasone-Salmeterol (ADVAIR) 250-50 MCG/DOSE AEPB   Inhalation   Inhale 1 puff into the lungs 2 (two) times daily.         . hydrocortisone (ANUSOL-HC) 25 MG suppository   Rectal   Place 25 mg rectally 3 (three) times daily as needed for hemorrhoids or itching.         . levothyroxine (SYNTHROID, LEVOTHROID) 50 MCG tablet   Oral   Take 50 mcg by mouth daily before breakfast.         . nystatin (MYCOSTATIN) 100000 UNIT/ML suspension   Oral   Take 5 mLs (500,000 Units total) by mouth 4 (four) times daily.   60 mL   0   . risperiDONE (RISPERDAL) 0.5 MG tablet   Oral   Take 0.5 mg by mouth at bedtime.         [name]  sodium bicarbonate 650 MG tablet   Oral   Take 2 tablets (1,300 mg total) by mouth 2 (two) times daily. Patient taking differently: Take 650 mg by mouth 2 (two) times daily.    20 tablet      . solifenacin (VESICARE) 5 MG tablet   Oral   Take 5 mg by mouth daily.         [name] sulfamethoxazole-trimethoprim (BACTRIM DS,SEPTRA DS) 800-160 MG per tablet   Oral   Take 1 tablet by mouth 2 (two) times daily.   14 tablet   0   . tamsulosin (FLOMAX) 0.4 MG CAPS capsule   Oral   Take 0.4 mg by mouth daily.           Allergies Mellaril; Morphine and related; Navane; and Thorazine  Family History  Problem Relation Age of Onset  . Stroke Father     Social History Social History  Substance Use Topics  . Smoking status: Current Every Day Smoker -- 1.00  packs/day for 55 years    Types: Cigarettes  . Smokeless tobacco: Former Systems developer    Types: Chew  . Alcohol Use: No    Review of Systems  Constitutional: Negative for fever. Eyes: Negative for visual changes. ENT: Negative for sore throat. Cardiovascular: Negative for chest pain. Respiratory: Negative for shortness of breath. Gastrointestinal: Negative for vomiting and diarrhea. Genitourinary: Negative for dysuria. Musculoskeletal: Positive for left flank pain Skin: Negative for rash. Neurological: Negative for headache. 10 point Review of Systems otherwise negative ____________________________________________   PHYSICAL EXAM:  VITAL SIGNS: ED Triage Vitals  Enc Vitals Group     BP 05/06/15 1053 171/91 mmHg     Pulse Rate 05/06/15 1053 104     Resp 05/06/15 1053 22     Temp 05/06/15 1053 98.7 F (37.1 C)     Temp Source 05/06/15 1053 Oral     SpO2 05/06/15 1053 98 %     Weight 05/06/15 1054 156 lb (70.761 kg)     Height 05/06/15 1054 6' (1.829 m)     Head Cir --      Peak Flow --      Pain Score 05/06/15 1027 8     Pain Loc --      Pain Edu? --      Excl. in [place]? --      Constitutional: Alert and oriented. Well appearing and in no distress. Eyes: Conjunctivae are normal. PERRL. Normal extraocular movements. ENT   Head: Normocephalic and atraumatic.   Nose: No congestion/rhinnorhea.   Mouth/Throat: Mucous membranes are moderately dry.   Neck: No stridor. Cardiovascular/Chest: Slight tachycardia, regular.  No murmurs, rubs, or gallops. Respiratory: Normal respiratory effort without tachypnea nor retractions. Breath sounds are clear and equal bilaterally. No wheezes/rales/rhonchi. Gastrointestinal: Soft. No distention, no guarding, no rebound. Moderate tenderness over the suprapubic area.  Genitourinary/rectal:Deferred Musculoskeletal: Nontender with normal range of motion in all extremities. No joint effusions.  No lower extremity tenderness.  No  edema. Neurologic:  Normal speech and language. No gross or focal neurologic deficits are appreciated. Skin:  Skin is warm, dry and intact. No rash noted. Psychiatric: Mood and affect are normal. Speech and behavior are normal. Patient exhibits appropriate insight and judgment.  ____________________________________________   EKG I, Lisa Roca, MD, the attending physician have personally viewed and interpreted all ECGs.  No EKG performed ____________________________________________  LABS (pertinent positives/negatives)  Urinalysis positive for nitrites, leukocytes 3+, red blood cells and white blood cells are too  numerous to count, and many bacteria and no squamous cells Comprehensive metabolic panel significant for BUN 34 and cranial 2.54 White blood cell count is 26.3, hemoglobin 14.1 and platelet count 223  ____________________________________________  RADIOLOGY All Xrays were viewed by me. Imaging interpreted by Radiologist.  None __________________________________________  PROCEDURES  Procedure(s) performed: None  Critical Care performed: None  ____________________________________________   ED COURSE / ASSESSMENT AND PLAN  CONSULTATIONS: Hospitalist for admission  Pertinent labs & imaging results that were available during my care of the patient were reviewed by me and considered in my medical decision making (see chart for details).   Patient arrived with symptoms concerning for urinary tract infection, confirmed with urinalysis which is nitrite positive. I'm concerned about early sepsis with slight tachycardia, as well as confirmed infection of the urinary tract, as well as elevated white blood count of 26.3. Fluid bolus will be initiated. He is showing acute renal failure on chronic renal insufficiency.  No hypotension. Altered mental status. Patient is stable for admission to the hospital. Upon review of his prior urine cultures, he has had multiple resistant  UTIs. Based on prior urine culture, Zosyn was chosen, due to the fact that it was sensitive previously.  Upon review of prior Acetobacter, which was resistant to Zosyn, antibiotic choice was changed to [place].  Discussed choice of antibiotic with admitting hospitalist Dr. Verdell Carmine.  Patient / Family / Caregiver informed of clinical course, medical decision-making process, and agree with plan.    ___________________________________________   FINAL CLINICAL IMPRESSION(S) / ED DIAGNOSES   Final diagnoses:  Pyelonephritis  Acute renal failure, unspecified acute renal failure type (HCC)       Lisa Roca, MD 05/06/15 [place], MD 05/06/15 1325

## 2015-05-06 NOTE — ED Notes (Signed)
Called pharmacy to send up Colbert Ewing per MD.

## 2015-05-06 NOTE — Clinical Social Work Note (Signed)
Clinical Social Work Assessment  Patient Details  Name: Alan Small MRN: [id number] Date of Birth: May 02, 1956  Date of referral:  05/06/15               Reason for consult:   (from [place] and [facility])                Permission sought to share information with:  Facility Sport and exercise psychologist Permission granted to share information::  Yes, Verbal Permission Granted  Name::      ([facility] [phone])  Agency::     Relationship::     Contact Information:     Housing/Transportation Living arrangements for the past 2 months:  Group Home Source of Information:  Patient, Medical Team, Facility Patient Interpreter Needed:  None Criminal Activity/Legal Involvement Pertinent to Current Situation/Hospitalization:  No - Comment as needed Significant Relationships:  Other Family Members Lives with:  Facility Resident Do you feel safe going back to the place where you live?  Yes Need for family participation in patient care:  No (Coment)  Care giving concerns:  None at this time   Facilities manager / plan:  Holiday representative (CSW) consult, patient is from [place] and [facility].   Patient was alert and oriented and engaged in conversation with CSW.  (Additional Information provided by [facility] spoke to [place]).  CSW introduced self and explained role of CSW department Patient has lived at [facility] a little less than a year  and plans to return at discharge.   Patient's support system are his sisters. Per patient is guardian is the group home owner.  CSW unable to verify as the group home staff could not confirm.  Patient uses a cane when at the [facility] to assist him to ambulate.     Patient is being admitted to the medical floor.  CSW will continue to follow patient and assist with discharge back to [facility].   Employment status:  Disabled (Comment on whether or not currently receiving Disability) Insurance information:  Medicare, Medicaid In [place] PT Recommendations:   Not assessed at this time Information / Referral to community resources:   (none at this time)  Patient/Family's Response to care:  Patient was appreciative of talking with CSW.  Patient/Family's Understanding of and Emotional Response to Diagnosis, Current Treatment, and Prognosis:  Patient understands that he is being admitted to the hospital for  continued medical work up at this time.  Once medically stable he plans to discharge back to his family care home.  Emotional Assessment Appearance:  Appears stated age Attitude/Demeanor/Rapport:    Affect (typically observed):  Accepting, Adaptable, Pleasant, Restless Orientation:  Oriented to Self, Oriented to Place, Oriented to  Time, Oriented to Situation Alcohol / Substance use:  Tobacco Use Psych involvement (Current and /or in the community):  Outpatient Provider  Discharge Needs  Concerns to be addressed:  Care Coordination, Discharge Planning Concerns Readmission within the last 30 days:  No Current discharge risk:  Chronically ill, Psychiatric Illness Barriers to Discharge:  Continued Medical Work up   Maurine Cane, LCSW 05/06/2015, 5:34 PM

## 2015-05-07 ENCOUNTER — Inpatient Hospital Stay: Payer: Medicare Other

## 2015-05-07 DIAGNOSIS — A419 Sepsis, unspecified organism: Secondary | ICD-10-CM

## 2015-05-07 LAB — BASIC METABOLIC PANEL
ANION GAP: 7 (ref 5–15)
BUN: 50 mg/dL — ABNORMAL HIGH (ref 6–20)
CO2: 17 mmol/L — AB (ref 22–32)
Calcium: 8 mg/dL — ABNORMAL LOW (ref 8.9–10.3)
Chloride: 111 mmol/L (ref 101–111)
Creatinine, Ser: 4.5 mg/dL — ABNORMAL HIGH (ref 0.61–1.24)
GFR calc Af Amer: 15 mL/min — ABNORMAL LOW (ref >60)
GFR calc non Af Amer: 13 mL/min — ABNORMAL LOW (ref >60)
GLUCOSE: 105 mg/dL — AB (ref 65–99)
POTASSIUM: 4.2 mmol/L (ref 3.5–5.1)
Sodium: 135 mmol/L (ref 135–145)

## 2015-05-07 LAB — MRSA PCR SCREENING: MRSA by PCR: NEGATIVE

## 2015-05-07 LAB — CBC
HEMATOCRIT: 35.6 % — AB (ref 40.0–52.0)
HEMOGLOBIN: 11.8 g/dL — AB (ref 13.0–18.0)
MCH: 30.7 pg (ref 26.0–34.0)
MCHC: 33.1 g/dL (ref 32.0–36.0)
MCV: 92.7 fL (ref 80.0–100.0)
Platelets: 118 10*3/uL — ABNORMAL LOW (ref 150–440)
RBC: 3.84 MIL/uL — ABNORMAL LOW (ref 4.40–5.90)
RDW: 14.8 % — ABNORMAL HIGH (ref 11.5–14.5)
WBC: 40.6 10*3/uL — ABNORMAL HIGH (ref 3.8–10.6)

## 2015-05-07 MED ORDER — SODIUM CHLORIDE 0.9 % IJ SOLN
10.0000 mL | Freq: Two times a day (BID) | INTRAMUSCULAR | Status: DC
Start: 2015-05-07 — End: 2015-05-15
  Administered 2015-05-07 – 2015-05-08 (×3): 10 mL
  Administered 2015-05-08: 40 mL
  Administered 2015-05-09 – 2015-05-14 (×11): 10 mL

## 2015-05-07 MED ORDER — NOREPINEPHRINE BITARTRATE 1 MG/ML IV SOLN
0.0000 ug/min | INTRAVENOUS | Status: DC
  Administered 2015-05-07: 15 ug/min via INTRAVENOUS
  Administered 2015-05-07: 6 ug/min via INTRAVENOUS
  Administered 2015-05-07: 17 ug/min via INTRAVENOUS
  Administered 2015-05-07: 10 ug/min via INTRAVENOUS
  Filled 2015-05-07 (×3): qty 4

## 2015-05-07 MED ORDER — SODIUM CHLORIDE 0.9 % IV BOLUS (SEPSIS): 1000.0000 mL | INTRAVENOUS | Status: DC | PRN

## 2015-05-07 MED ORDER — SODIUM CHLORIDE 0.9 % IV SOLN
500.0000 mg | INTRAVENOUS | Status: DC
  Filled 2015-05-07: qty 0.5

## 2015-05-07 MED ORDER — BELLADONNA ALKALOIDS-OPIUM 16.2-60 MG RE SUPP
1.0000 | Freq: Four times a day (QID) | RECTAL | Status: DC | PRN
  Administered 2015-05-07: 1 via RECTAL
  Filled 2015-05-07: qty 1

## 2015-05-07 MED ORDER — SODIUM CHLORIDE 0.9 % IJ SOLN
10.0000 mL | INTRAMUSCULAR | Status: DC | PRN
  Administered 2015-05-12 (×2): 10 mL
  Filled 2015-05-07 (×2): qty 40

## 2015-05-07 MED ORDER — HEPARIN SODIUM (PORCINE) 5000 UNIT/ML IJ SOLN
5000.0000 [IU] | Freq: Three times a day (TID) | INTRAMUSCULAR | Status: DC
  Administered 2015-05-07 – 2015-05-09 (×6): 5000 [IU] via SUBCUTANEOUS
  Filled 2015-05-07 (×6): qty 1

## 2015-05-07 MED ORDER — SODIUM CHLORIDE 0.9 % IV SOLN
500.0000 mg | Freq: Two times a day (BID) | INTRAVENOUS | Status: DC
  Administered 2015-05-07 – 2015-05-14 (×14): 500 mg via INTRAVENOUS
  Filled 2015-05-07 (×16): qty 0.5

## 2015-05-07 NOTE — Progress Notes (Signed)
Patient's sister, Susann Givens, notified of patient's transfer to ICU and current situation. Patient hypotensive, asymptomatic-- no nausea, no dizziness. Patient to recieve IV medications to assist with increasing BP. Sister, Dola Argyle, verbalized understanding.

## 2015-05-07 NOTE — Progress Notes (Signed)
Pt complaining of bladder spasms. States at home he takes dilantin. Paged/called Dr. Tressia Miners and she stated she would order medicine for bladder spasms but will hold off on dilantin for now until home med list can be verified.

## 2015-05-07 NOTE — Progress Notes (Signed)
ANTIBIOTIC CONSULT NOTE - INITIAL  Pharmacy Consult for Meropenem Indication: sepsis  Allergies  Allergen Reactions  . Mellaril [Thioridazine] Other (See Comments)    States sinuses stops/ swell up   . Morphine And Related Itching  . Navane [Thiothixene] Other (See Comments)    GI Distress  . Thorazine [Chlorpromazine] Other (See Comments)    Sensitive to sunlight. Sunburns easily    Patient Measurements: Height: 6' (182.9 cm) Weight: 164 lb 14.5 oz (74.8 kg) IBW/kg (Calculated) : 77.6 Adjusted Body Weight:   Vital Signs: Temp: 98.8 F (37.1 C) (12/05 1600) Temp Source: Oral (12/05 1600) BP: 102/74 mmHg (12/05 1600) Pulse Rate: 96 (12/05 1600) Intake/Output from previous day: 12/04 0701 - 12/05 0700 In: 3956.7 [I.V.:3906.7; IV Piggyback:50] Out: 300 [Urine:300] Intake/Output from this shift: Total I/O In: 1563 [I.V.:1513; IV Piggyback:50] Out: -   Labs:  Recent Labs  05/06/15 1057 05/07/15 0420  WBC 26.3* 40.6*  HGB 14.1 11.8*  PLT 223 118*  CREATININE 2.54* 4.50*   Estimated Creatinine Clearance: 18.5 mL/min (by C-G formula based on Cr of 4.5). No results for input(s): VANCOTROUGH, VANCOPEAK, VANCORANDOM, GENTTROUGH, GENTPEAK, GENTRANDOM, TOBRATROUGH, TOBRAPEAK, TOBRARND, AMIKACINPEAK, AMIKACINTROU, AMIKACIN in the last 72 hours.   Microbiology: Recent Results (from the past 720 hour(s))  Urine culture     Status: None (Preliminary result)   Collection Time: 05/06/15 11:12 AM  Result Value Ref Range Status   Specimen Description URINE, RANDOM  Final   Special Requests NONE  Final   Culture HOLDING FOR POSSIBLE PATHOGEN  Final   Report Status PENDING  Incomplete  Culture, blood (routine x 2)     Status: None (Preliminary result)   Collection Time: 05/06/15  1:08 PM  Result Value Ref Range Status   Specimen Description BLOOD RIGHT AC  Final   Special Requests BOTTLES DRAWN AEROBIC AND ANAEROBIC  3CC  Final   Culture  Setup Time   Final    GRAM  NEGATIVE RODS IN BOTH AEROBIC AND ANAEROBIC BOTTLES CRITICAL RESULT CALLED TO, READ BACK BY AND VERIFIED WITH: CHAROLOTTE KYEI AT 2342 05/06/15.PMH CONFIRMED BY WM    Culture   Final    GRAM NEGATIVE RODS IN BOTH AEROBIC AND ANAEROBIC BOTTLES IDENTIFICATION TO FOLLOW    Report Status PENDING  Incomplete  Culture, blood (routine x 2)     Status: None (Preliminary result)   Collection Time: 05/06/15  1:13 PM  Result Value Ref Range Status   Specimen Description BLOOD LEFT AC  Final   Special Requests BOTTLES DRAWN AEROBIC AND ANAEROBIC  1CC  Final   Culture  Setup Time   Final    GRAM NEGATIVE RODS IN BOTH AEROBIC AND ANAEROBIC BOTTLES CRITICAL RESULT CALLED TO, READ BACK BY AND VERIFIED WITH: CHARLOTTE KYEI AT 2146 05/06/15.PMH CONFIRMED BY CAF    Culture   Final    GRAM NEGATIVE RODS IN BOTH AEROBIC AND ANAEROBIC BOTTLES IDENTIFICATION TO FOLLOW    Report Status PENDING  Incomplete  MRSA PCR Screening     Status: None   Collection Time: 05/07/15  7:14 AM  Result Value Ref Range Status   MRSA by PCR NEGATIVE NEGATIVE Final    Comment:        The GeneXpert MRSA Assay (FDA approved for NASAL specimens only), is one component of a comprehensive MRSA colonization surveillance program. It is not intended to diagnose MRSA infection nor to guide or monitor treatment for MRSA infections.     Medical History: Past Medical  History  Diagnosis Date  . Mental disorder   . Seizures ([place])   . Myocardial infarction ([place])   . Stroke ([place])   . Asthma   . Paranoid schizophrenia ([place])   . Gastric ulcer   . External hemorrhoids   . Hypothyroidism     hypo  . Insomnia   . Depression   . Renal disorder     renal failure  . Hydronephrosis     Medications:  Prescriptions prior to admission  Medication Sig Dispense Refill Last Dose  . albuterol-ipratropium (COMBIVENT) 18-103 MCG/ACT inhaler Inhale 2 puffs into the lungs every 6 (six) hours as needed for wheezing.   prn at prn   . diphenhydrAMINE (BENADRYL) 50 MG capsule Take 50 mg by mouth at bedtime.   prn at prn  . FLUoxetine (PROZAC) 40 MG capsule Take 40 mg by mouth daily.   05/05/2015 at 0800  . Fluticasone-Salmeterol (ADVAIR) 250-50 MCG/DOSE AEPB Inhale 1 puff into the lungs 2 (two) times daily.   05/05/2015 at 0800  . levothyroxine (SYNTHROID, LEVOTHROID) 50 MCG tablet Take 50 mcg by mouth daily before breakfast.   05/05/2015 at 0700  . risperiDONE (RISPERDAL) 0.5 MG tablet Take 0.5 mg by mouth at bedtime.   05/05/2015 at 2000  . risperiDONE (RISPERDAL) 3 MG tablet Take 3 mg by mouth at bedtime.   05/05/2015 at 2000  . sodium bicarbonate 650 MG tablet Take 2 tablets (1,300 mg total) by mouth 2 (two) times daily. (Patient taking differently: Take 650 mg by mouth 2 (two) times daily. ) 20 tablet  05/06/2015 at 0800  . solifenacin (VESICARE) 5 MG tablet Take 5 mg by mouth daily.   05/05/2015 at 0800  . sulfamethoxazole-trimethoprim (BACTRIM,SEPTRA) 400-80 MG tablet Take 1 tablet by mouth 2 (two) times daily.   05/06/2015 at 0800  . tamsulosin (FLOMAX) 0.4 MG CAPS capsule Take 0.4 mg by mouth daily.   05/05/2015 at 2000   Assessment: CrCl = 18.5 ml/min  Goal of Therapy:  resolution of infection  Plan:  Will start meropenem 500 mg IV Q12H.  Sylvana Bonk D 05/07/2015,4:54 PM

## 2015-05-07 NOTE — Plan of Care (Signed)
Problem: Fluid Volume: Goal: Hemodynamic stability will improve Outcome: Progressing At the start of the shift Levophed was at 17 mcg. Titrated down to 8 mcg. CVP is at 9. NS is at 125 mL/hr  Problem: Physical Regulation: Goal: Diagnostic test results will improve Outcome: Completed/Met Date Met:  05/07/15 Last lactic acid 1.2 Goal: Signs and symptoms of infection will decrease Outcome: Progressing Removed foley that was present on admission which was obstructing the bladder. Replaced foley and pink, urine with sediment and mucous noted. Pt urinary output of 1700 mL.  Problem: Respiratory: Goal: Ability to maintain adequate ventilation will improve Outcome: Completed/Met Date Met:  05/07/15 Pt on room air and O2 sats are > 98%

## 2015-05-07 NOTE — Procedures (Cosign Needed)
Central Venous Catheter Placement: Indication: Patient receiving vesicant or irritant drug.; Patient receiving intravenous therapy for longer than 5 days.; Patient has limited or no vascular access.   Consent:obtained from patient.   Risks and benefits explained in detail including risk of infection, bleeding, respiratory failure and death..   Hand washing performed prior to starting the procedure.   Procedure: An active timeout was performed and correct patient, name, & ID confirmed.  After explaining risk and benefits, patient was positioned correctly for central venous access. Patient was prepped using strict sterile technique including chlorohexadine preps, sterile drape, sterile gown and sterile gloves.  The area was prepped, draped and anesthetized in the usual sterile manner. Patient comfort was obtained.  A triple lumen catheter was placed in right subclavian Vein using a supraclavicular approach under realtime ultrasound guidance. There was good blood return, catheter caps were placed on lumens, catheter flushed easily, the line was secured and a sterile dressing and BIO-PATCH applied.   Ultrasound was used to visualize vasculature and guidance of needle.   Number of Attempts: 1 Complications:none Estimated Blood Loss: none Chest Radiograph indicated and ordered.   Operator: Marda Stalker, M.D.   Marda Stalker, M.D.

## 2015-05-07 NOTE — Progress Notes (Signed)
[facility] [place] NAME: Alan Small    MR#:  [id number]  DATE OF BIRTH:  Apr 17, 1956  SUBJECTIVE:  CHIEF COMPLAINT:   Chief Complaint  Patient presents with  . Abdominal Pain   -Complaining of severe bladder spasms and pain. -Blood pressure was low, so moved to ICU for pressors.  REVIEW OF SYSTEMS:  Review of Systems  Constitutional: Negative for fever and chills.  Respiratory: Negative for cough, shortness of breath and wheezing.   Cardiovascular: Negative for chest pain and palpitations.  Gastrointestinal: Negative for nausea, vomiting, abdominal pain, diarrhea and constipation.  Genitourinary: Positive for dysuria.       Significant Bladder spasms  Neurological: Negative for dizziness, seizures and headaches.    DRUG ALLERGIES:   Allergies  Allergen Reactions  . Mellaril [Thioridazine] Other (See Comments)    States sinuses stops/ swell up   . Morphine And Related Itching  . Navane [Thiothixene] Other (See Comments)    GI Distress  . Thorazine [Chlorpromazine] Other (See Comments)    Sensitive to sunlight. Sunburns easily    VITALS:  Blood pressure 102/74, pulse 96, temperature 98.8 F (37.1 C), temperature source Oral, resp. rate 31, height 6' (1.829 m), weight 74.8 kg (164 lb 14.5 oz), SpO2 97 %.  PHYSICAL EXAMINATION:  Physical Exam  GENERAL:  59 y.o.-year-old patient lying in the bed with no acute distress. Occasional bladder spasms causing him to be in distress. EYES: Pupils equal, round, reactive to light and accommodation. No scleral icterus. Extraocular muscles intact.  HEENT: Head atraumatic, normocephalic. Oropharynx and nasopharynx clear.  NECK:  Supple, no jugular venous distention. No thyroid enlargement, no tenderness.  LUNGS: Normal breath sounds bilaterally, no wheezing, rales,rhonchi or crepitation. No use of accessory muscles of respiration.  CARDIOVASCULAR: S1, S2 normal. No murmurs, rubs,  or gallops.  ABDOMEN: Soft, nontender, nondistended. Bowel sounds present. No organomegaly or mass. Some suprapubic discomfort on palpation noted. Has a chronic indwelling Foley catheter. EXTREMITIES: No pedal edema, cyanosis, or clubbing.  NEUROLOGIC: Cranial nerves II through XII are intact. Muscle strength 5/5 in all extremities. Sensation intact. Gait not checked.  PSYCHIATRIC: The patient is alert and oriented x 3.  SKIN: No obvious rash, lesion, or ulcer.    LABORATORY PANEL:   CBC  Recent Labs Lab 05/07/15 0420  WBC 40.6*  HGB 11.8*  HCT 35.6*  PLT 118*   ------------------------------------------------------------------------------------------------------------------  Chemistries   Recent Labs Lab 05/06/15 1057 05/07/15 0420  NA 142 135  K 4.5 4.2  CL 109 111  CO2 18* 17*  GLUCOSE 112* 105*  BUN 34* 50*  CREATININE 2.54* 4.50*  CALCIUM 9.7 8.0*  AST 26  --   ALT 20  --   ALKPHOS 106  --   BILITOT 0.4  --    ------------------------------------------------------------------------------------------------------------------  Cardiac Enzymes No results for input(s): TROPONINI in the last 168 hours. ------------------------------------------------------------------------------------------------------------------  RADIOLOGY:  Dg Chest Port 1 View  05/07/2015  CLINICAL DATA:  Central catheter placement EXAM: PORTABLE CHEST 1 VIEW COMPARISON:  January 17, 2015 FINDINGS: Central catheter tip is at the cavoatrial junction. No pneumothorax. There is a small area of infiltrate in the right lower lobe. Lungs elsewhere clear. Heart size and pulmonary vascularity are normal. No adenopathy. No bone lesions. IMPRESSION: Central catheter tip at cavoatrial junction. No pneumothorax. Small area of of infiltrate right base. Electronically Signed   By: Lowella Grip III M.D.   On: 05/07/2015 10:13  EKG:   Orders placed or performed during the hospital encounter of  02/22/15  . EKG    ASSESSMENT AND PLAN:   59 year old male with past medical history of paranoid schizophrenia, hypothyroidism, history of chronic indwelling Foley catheter, history of multidrug resistant UTI, depression who presents to the hospital due to abdominal pain and noted to have sepsis secondary to UTI.   #1 sepsis-follow blood and urine cultures --Continue IV fluids, patient has a history of multidrug resistant UTI and so on IV  meropenem . -ID consult is pending -On low-dose levophed at this time and in ICU  #2 urinary tract infection-patient is high risk given his chronic indwelling Foley. -Patient has a history of a multidrug resistant Escherichia coli, Acinetobacter UTI.  -On IV meropenem. -Significant bladder spasms-so started belladonna opioids suppositories    #3 acute renal failure on CK D-follows with nephrology at [facility]. -Baseline GFR seems to be around 30, stage III CK D. Creatinine and GFR worsened. -Avoid nephrotoxins. Gentle hydration. -Likely ATN from sepsis and hypotension  -We'll get nephrology consult. Patient is status post right nephrectomy.   #4 COPD-no acute exacerbation. -Continue home inhalers.  #5 paranoid schizophrenia-continue risperidone  -On Prozac for depression    #6 DVT prophylaxis-on subcutaneous heparin   All the records are reviewed and case discussed with Care Management/Social Workerr. Management plans discussed with the patient, family and they are in agreement.  CODE STATUS: Full Code  TOTAL CRITICAL CARE TIME SPENT IN TAKING CARE OF THIS PATIENT: 40 minutes.   POSSIBLE D/C IN 2-3 DAYS, DEPENDING ON CLINICAL CONDITION.   Curties Conigliaro M.D on 05/07/2015 at 4:43 PM  Between 7am to 6pm - Pager - [phone]  After 6pm go to [email or web address] - password [email or web address] [facility]  Office  [phone]  CC: Primary care physician; [name], MD

## 2015-05-07 NOTE — Progress Notes (Signed)
Patient received from 2A Telemetry approx [phone] for low blood pressure, MD at bedside, multiple attempts to IV placement.  NS Bolus #2 infusing to RAC #20g.  Orders for levophed to maintain BP, will currently run through peripheral per MD order. Will continue to assess for changes/need.

## 2015-05-07 NOTE — Consult Note (Signed)
[facility] Infectious Disease     Reason for Consult:Sepsis, MDRO uti   Referring Physician: Claria Dice Date of Admission:  05/06/2015   Active Problems:   Sepsis ([place])   HPI: Alan Small is a 59 y.o. male with hx paranoid schizophrenia, hypothyroidism, history of chronic indwelling Foley catheter, history of multidrug resistant UTI, depression who presents to the hospital due to abdominal pain and noted to have sepsis secondary to UTI.  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  It was noted that he should have monthly cath chnagees, and he was started on bactrim qd prophylaxis.  He reports that his urine became more cloudy and he thinks his cath became clogged. He has since admission had wbc elevated to 40 k.  BCX are + gnr. He is on pressors but is awake and alert.   Past Medical History  Diagnosis Date  . Mental disorder   . Seizures ([place])   . Myocardial infarction ([place])   . Stroke ([place])   . Asthma   . Paranoid schizophrenia ([place])   . Gastric ulcer   . External hemorrhoids   . Hypothyroidism     hypo  . Insomnia   . Depression   . Renal disorder     renal failure  . Hydronephrosis    Past Surgical History  Procedure Laterality Date  . Appendectomy    . Colonoscopy with propofol N/A 12/07/2014    Procedure: COLONOSCOPY WITH PROPOFOL;  Surgeon: Manya Silvas, MD;  Location[facility] ENDOSCOPY;  Service: Endoscopy;  Laterality: N/A;  . Esophagogastroduodenoscopy N/A 12/07/2014    Procedure: ESOPHAGOGASTRODUODENOSCOPY (EGD);  Surgeon: Manya Silvas, MD;  Location: [facility] ENDOSCOPY;  Service: Endoscopy;  Laterality: N/A;  . Nephrectomy     Social History  Substance Use Topics  . Smoking status: Current Every Day Smoker -- 1.00 packs/day for 55 years    Types: Cigarettes  . Smokeless tobacco: Former Systems developer    Types: Chew  . Alcohol  Use: No   Family History  Problem Relation Age of Onset  . Stroke Father   . Pneumonia Father   . Brain cancer Mother     Allergies:  Allergies  Allergen Reactions  . Mellaril [Thioridazine] Other (See Comments)    States sinuses stops/ swell up   . Morphine And Related Itching  . Navane [Thiothixene] Other (See Comments)    GI Distress  . Thorazine [Chlorpromazine] Other (See Comments)    Sensitive to sunlight. Sunburns easily    Current antibiotics: Antibiotics Given (last 72 hours)    Date/Time Action Medication Dose Rate   05/06/15 2119 Given   ertapenem (INVANZ) 1 g in sodium chloride 0.9 % 50 mL IVPB 1 g 100 mL/hr      MEDICATIONS: . darifenacin  7.5 mg Oral Daily  . FLUoxetine  40 mg Oral Daily  . heparin subcutaneous  5,000 Units Subcutaneous 3 times per day  . levothyroxine  50 mcg Oral QAC breakfast  . meropenem (MERREM) IV  500 mg Intravenous Q12H  . mometasone-formoterol  2 puff Inhalation BID  . risperiDONE  3 mg Oral QHS  . sodium chloride  10-40 mL Intracatheter Q12H  . tamsulosin  0.4 mg Oral Daily    Review of Systems - 11 systems reviewed and negative per HPI   OBJECTIVE: Temp:  [97.5 F (36.4 C)-98.8 F (  37.1 C)] 98.8 F (37.1 C) (12/05 1600) Pulse Rate:  [92-109] 94 (12/05 2000) Resp:  [18-31] 25 (12/05 2000) BP: (58-122)/(42-80) 101/77 mmHg (12/05 2000) SpO2:  [94 %-100 %] 96 % (12/05 2000) Weight:  [74.8 kg (164 lb 14.5 oz)] 74.8 kg (164 lb 14.5 oz) (12/05 0700) GENERAL: 59 y.o.-year-old patient lying in the bed with no acute distress.EYES: Pupils equal, round, reactive to light and accommodation. No scleral icterus. Extraocular muscles intact.  HEENT: Head atraumatic, normocephalic. Oropharynx and nasopharynx clear.  NECK: Supple, no jugular venous distention. No thyroid enlargement, no tenderness.  LUNGS: Normal breath sounds bilaterally, no wheezing, rales,rhonchi or crepitation. No use of accessory muscles of respiration.   CARDIOVASCULAR: S1, S2 normal. No murmurs, rubs, or gallops.  ABDOMEN: Soft, nontender, nondistended. Bowel sounds present. No organomegaly or mass. Some suprapubic discomfort on palpation noted. Has a chronic indwelling Foley catheter. EXTREMITIES: No pedal edema, cyanosis, or clubbing.  NEUROLOGIC: Cranial nerves II through XII are intact. Muscle strength 5/5 in all extremities. Sensation intact. Gait not checked.  PSYCHIATRIC: The patient is alert and oriented x 3.  SKIN: No obvious rash, lesion, or ulcer.    LABS: Results for orders placed or performed during the hospital encounter of 05/06/15 (from the past 48 hour(s))  Lipase, blood     Status: None   Collection Time: 05/06/15 10:57 AM  Result Value Ref Range   Lipase 30 11 - 51 U/L  Comprehensive metabolic panel     Status: Abnormal   Collection Time: 05/06/15 10:57 AM  Result Value Ref Range   Sodium 142 135 - 145 mmol/L   Potassium 4.5 3.5 - 5.1 mmol/L   Chloride 109 101 - 111 mmol/L   CO2 18 (L) 22 - 32 mmol/L   Glucose, Bld 112 (H) 65 - 99 mg/dL   BUN 34 (H) 6 - 20 mg/dL   Creatinine, Ser 2.54 (H) 0.61 - 1.24 mg/dL   Calcium 9.7 8.9 - 10.3 mg/dL   Total Protein 7.2 6.5 - 8.1 g/dL   Albumin 3.8 3.5 - 5.0 g/dL   AST 26 15 - 41 U/L   ALT 20 17 - 63 U/L   Alkaline Phosphatase 106 38 - 126 U/L   Total Bilirubin 0.4 0.3 - 1.2 mg/dL   GFR calc non Af Amer 26 (L) >60 mL/min   GFR calc Af Amer 30 (L) >60 mL/min    Comment: (NOTE) The eGFR has been calculated using the CKD EPI equation. This calculation has not been validated in all clinical situations. eGFR's persistently <60 mL/min signify possible Chronic Kidney Disease.    Anion gap 15 5 - 15  CBC     Status: Abnormal   Collection Time: 05/06/15 10:57 AM  Result Value Ref Range   WBC 26.3 (H) 3.8 - 10.6 K/uL   RBC 4.75 4.40 - 5.90 MIL/uL   Hemoglobin 14.1 13.0 - 18.0 g/dL   HCT 43.6 40.0 - 52.0 %   MCV 91.7 80.0 - 100.0 fL   MCH 29.7 26.0 - 34.0 pg   MCHC  32.4 32.0 - 36.0 g/dL   RDW 14.7 (H) 11.5 - 14.5 %   Platelets 223 150 - 440 K/uL  Urinalysis complete, with microscopic ([facility] only)     Status: Abnormal   Collection Time: 05/06/15 11:12 AM  Result Value Ref Range   Color, Urine YELLOW (A) YELLOW   APPearance TURBID (A) CLEAR   Glucose, UA NEGATIVE NEGATIVE mg/dL   Bilirubin Urine NEGATIVE NEGATIVE  Ketones, ur NEGATIVE NEGATIVE mg/dL   Specific Gravity, Urine 1.009 1.005 - 1.030   Hgb urine dipstick 3+ (A) NEGATIVE   pH 9.0 (H) 5.0 - 8.0   Protein, ur 100 (A) NEGATIVE mg/dL   Nitrite POSITIVE (A) NEGATIVE   Leukocytes, UA 3+ (A) NEGATIVE   RBC / HPF TOO NUMEROUS TO COUNT 0 - 5 RBC/hpf   WBC, UA TOO NUMEROUS TO COUNT 0 - 5 WBC/hpf   Bacteria, UA MANY (A) NONE SEEN   Squamous Epithelial / LPF NONE SEEN NONE SEEN   WBC Clumps PRESENT   Urine culture     Status: None (Preliminary result)   Collection Time: 05/06/15 11:12 AM  Result Value Ref Range   Specimen Description URINE, RANDOM    Special Requests NONE    Culture HOLDING FOR POSSIBLE PATHOGEN    Report Status PENDING   Culture, blood (routine x 2)     Status: None (Preliminary result)   Collection Time: 05/06/15  1:08 PM  Result Value Ref Range   Specimen Description BLOOD RIGHT AC    Special Requests BOTTLES DRAWN AEROBIC AND ANAEROBIC  3CC    Culture  Setup Time      GRAM NEGATIVE RODS IN BOTH AEROBIC AND ANAEROBIC BOTTLES CRITICAL RESULT CALLED TO, READ BACK BY AND VERIFIED WITH: CHAROLOTTE KYEI AT 2342 05/06/15.PMH CONFIRMED BY WM    Culture      GRAM NEGATIVE RODS IN BOTH AEROBIC AND ANAEROBIC BOTTLES IDENTIFICATION TO FOLLOW    Report Status PENDING   Culture, blood (routine x 2)     Status: None (Preliminary result)   Collection Time: 05/06/15  1:13 PM  Result Value Ref Range   Specimen Description BLOOD LEFT AC    Special Requests BOTTLES DRAWN AEROBIC AND ANAEROBIC  1CC    Culture  Setup Time      GRAM NEGATIVE RODS IN BOTH AEROBIC AND ANAEROBIC  BOTTLES CRITICAL RESULT CALLED TO, READ BACK BY AND VERIFIED WITH: CHARLOTTE KYEI AT 2146 05/06/15.PMH CONFIRMED BY CAF    Culture      GRAM NEGATIVE RODS IN BOTH AEROBIC AND ANAEROBIC BOTTLES IDENTIFICATION TO FOLLOW    Report Status PENDING   Lactic acid, plasma     Status: Abnormal   Collection Time: 05/06/15  1:13 PM  Result Value Ref Range   Lactic Acid, Venous 2.2 (HH) 0.5 - 2.0 mmol/L    Comment: CRITICAL RESULT CALLED TO, READ BACK BY AND VERIFIED WITH MEGAN JONES @ 1411 ON 05/06/2015 CAF   Lactic acid, plasma     Status: None   Collection Time: 05/06/15  6:27 PM  Result Value Ref Range   Lactic Acid, Venous 1.2 0.5 - 2.0 mmol/L  Basic metabolic panel     Status: Abnormal   Collection Time: 05/07/15  4:20 AM  Result Value Ref Range   Sodium 135 135 - 145 mmol/L   Potassium 4.2 3.5 - 5.1 mmol/L   Chloride 111 101 - 111 mmol/L   CO2 17 (L) 22 - 32 mmol/L   Glucose, Bld 105 (H) 65 - 99 mg/dL   BUN 50 (H) 6 - 20 mg/dL   Creatinine, Ser 4.50 (H) 0.61 - 1.24 mg/dL   Calcium 8.0 (L) 8.9 - 10.3 mg/dL   GFR calc non Af Amer 13 (L) >60 mL/min   GFR calc Af Amer 15 (L) >60 mL/min    Comment: (NOTE) The eGFR has been calculated using the CKD EPI equation. This calculation has not  been validated in all clinical situations. eGFR's persistently <60 mL/min signify possible Chronic Kidney Disease.    Anion gap 7 5 - 15  CBC     Status: Abnormal   Collection Time: 05/07/15  4:20 AM  Result Value Ref Range   WBC 40.6 (H) 3.8 - 10.6 K/uL   RBC 3.84 (L) 4.40 - 5.90 MIL/uL   Hemoglobin 11.8 (L) 13.0 - 18.0 g/dL   HCT 35.6 (L) 40.0 - 52.0 %   MCV 92.7 80.0 - 100.0 fL   MCH 30.7 26.0 - 34.0 pg   MCHC 33.1 32.0 - 36.0 g/dL   RDW 14.8 (H) 11.5 - 14.5 %   Platelets 118 (L) 150 - 440 K/uL  MRSA PCR Screening     Status: None   Collection Time: 05/07/15  7:14 AM  Result Value Ref Range   MRSA by PCR NEGATIVE NEGATIVE    Comment:        The GeneXpert MRSA Assay (FDA approved for  NASAL specimens only), is one component of a comprehensive MRSA colonization surveillance program. It is not intended to diagnose MRSA infection nor to guide or monitor treatment for MRSA infections.    No components found for: ESR, C REACTIVE PROTEIN MICRO: Recent Results (from the past 720 hour(s))  Urine culture     Status: None (Preliminary result)   Collection Time: 05/06/15 11:12 AM  Result Value Ref Range Status   Specimen Description URINE, RANDOM  Final   Special Requests NONE  Final   Culture HOLDING FOR POSSIBLE PATHOGEN  Final   Report Status PENDING  Incomplete  Culture, blood (routine x 2)     Status: None (Preliminary result)   Collection Time: 05/06/15  1:08 PM  Result Value Ref Range Status   Specimen Description BLOOD RIGHT AC  Final   Special Requests BOTTLES DRAWN AEROBIC AND ANAEROBIC  3CC  Final   Culture  Setup Time   Final    GRAM NEGATIVE RODS IN BOTH AEROBIC AND ANAEROBIC BOTTLES CRITICAL RESULT CALLED TO, READ BACK BY AND VERIFIED WITH: CHAROLOTTE KYEI AT 2342 05/06/15.PMH CONFIRMED BY WM    Culture   Final    GRAM NEGATIVE RODS IN BOTH AEROBIC AND ANAEROBIC BOTTLES IDENTIFICATION TO FOLLOW    Report Status PENDING  Incomplete  Culture, blood (routine x 2)     Status: None (Preliminary result)   Collection Time: 05/06/15  1:13 PM  Result Value Ref Range Status   Specimen Description BLOOD LEFT AC  Final   Special Requests BOTTLES DRAWN AEROBIC AND ANAEROBIC  1CC  Final   Culture  Setup Time   Final    GRAM NEGATIVE RODS IN BOTH AEROBIC AND ANAEROBIC BOTTLES CRITICAL RESULT CALLED TO, READ BACK BY AND VERIFIED WITH: CHARLOTTE KYEI AT 2146 05/06/15.PMH CONFIRMED BY CAF    Culture   Final    GRAM NEGATIVE RODS IN BOTH AEROBIC AND ANAEROBIC BOTTLES IDENTIFICATION TO FOLLOW    Report Status PENDING  Incomplete  MRSA PCR Screening     Status: None   Collection Time: 05/07/15  7:14 AM  Result Value Ref Range Status   MRSA by PCR NEGATIVE  NEGATIVE Final    Comment:        The GeneXpert MRSA Assay (FDA approved for NASAL specimens only), is one component of a comprehensive MRSA colonization surveillance program. It is not intended to diagnose MRSA infection nor to guide or monitor treatment for MRSA infections.     IMAGING:  Dg Chest Port 1 View  05/07/2015  CLINICAL DATA:  Central catheter placement EXAM: PORTABLE CHEST 1 VIEW COMPARISON:  January 17, 2015 FINDINGS: Central catheter tip is at the cavoatrial junction. No pneumothorax. There is a small area of infiltrate in the right lower lobe. Lungs elsewhere clear. Heart size and pulmonary vascularity are normal. No adenopathy. No bone lesions. IMPRESSION: Central catheter tip at cavoatrial junction. No pneumothorax. Small area of of infiltrate right base. Electronically Signed   By: Lowella Grip III M.D.   On: 05/07/2015 10:13    Assessment:   BEE HAMMERSCHMIDT is a 59 y.o. 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  He was seen by urology in Nov and placed on bactrim suppression. Admitted with cloudy urine, some possible foley clotting, fevers and leukocytosis. He has GNR bacteremia. Has hx MDRO UTI.   Recommendations Change ertapenem to meropenem to provide pseudomonal coverage Await cs results Strongly consider seeing if pt Lucianne Lei do without the Cath. Perhaps once out of unit remove foley and use Intermittent cath . Can see if this can be done at facility he is at.   Thank you very much for allowing me to participate in the care of this patient. Please call with questions.   Cheral Marker. Ola Spurr, MD

## 2015-05-07 NOTE — Progress Notes (Signed)
Patient is admitted to room 254 with a diagnosis  Of sepsis secondary to UTI. Alert and oriented x 4. Patient denied pain, no acute respiratory distress noted.  Patient is oriented to his room call bell/ascom and staff. Fall Neurosurgeon.  Noted bilateral groin rash and bruises on bilateral upper extremities and scars. Skin assessment done with Adriene RN.

## 2015-05-07 NOTE — Progress Notes (Signed)
Patient's BP=69/48 and a little clammy. Dr. Marcille Blanco notified with a new order to give a bolus of 1 L and increase the NS dose to 125 mL/hr. Rechecked BP manually was 58/46. New order from Dr. Marcille Blanco to give another Liter of bolus. Dr. Marcille Blanco later gave order to transfer the patient to ICU. Patient now in ICU 16.  Report was given to [facility].

## 2015-05-08 ENCOUNTER — Inpatient Hospital Stay: Payer: Medicare Other

## 2015-05-08 LAB — CULTURE, BLOOD (ROUTINE X 2)

## 2015-05-08 LAB — CBC
HCT: 32.4 % — ABNORMAL LOW (ref 40.0–52.0)
Hemoglobin: 10.5 g/dL — ABNORMAL LOW (ref 13.0–18.0)
MCH: 29.8 pg (ref 26.0–34.0)
MCHC: 32.5 g/dL (ref 32.0–36.0)
MCV: 91.7 fL (ref 80.0–100.0)
Platelets: 97 10*3/uL — ABNORMAL LOW (ref 150–440)
RBC: 3.54 MIL/uL — AB (ref 4.40–5.90)
RDW: 15.3 % — AB (ref 11.5–14.5)
WBC: 45.5 10*3/uL — ABNORMAL HIGH (ref 3.8–10.6)

## 2015-05-08 LAB — BASIC METABOLIC PANEL
ANION GAP: 7 (ref 5–15)
BUN: 54 mg/dL — ABNORMAL HIGH (ref 6–20)
CALCIUM: 7.3 mg/dL — AB (ref 8.9–10.3)
CO2: 16 mmol/L — ABNORMAL LOW (ref 22–32)
CREATININE: 3.86 mg/dL — AB (ref 0.61–1.24)
Chloride: 107 mmol/L (ref 101–111)
GFR, EST AFRICAN AMERICAN: 18 mL/min — AB (ref >60)
GFR, EST NON AFRICAN AMERICAN: 16 mL/min — AB (ref >60)
Glucose, Bld: 83 mg/dL (ref 65–99)
Potassium: 4.3 mmol/L (ref 3.5–5.1)
SODIUM: 130 mmol/L — AB (ref 135–145)

## 2015-05-08 MED ORDER — ENSURE ENLIVE PO LIQD
237.0000 mL | Freq: Two times a day (BID) | ORAL | Status: DC
  Administered 2015-05-08 – 2015-05-14 (×13): 237 mL via ORAL

## 2015-05-08 MED ORDER — SODIUM BICARBONATE 8.4 % IV SOLN
INTRAVENOUS | Status: DC
  Administered 2015-05-08 – 2015-05-09 (×3): via INTRAVENOUS
  Filled 2015-05-08 (×4): qty 150

## 2015-05-08 MED ORDER — FUROSEMIDE 10 MG/ML IJ SOLN
INTRAMUSCULAR | Status: AC
  Filled 2015-05-08: qty 4

## 2015-05-08 NOTE — Progress Notes (Signed)
ANTIBIOTIC CONSULT NOTE - INITIAL  Pharmacy Consult for Meropenem Indication: sepsis  Allergies  Allergen Reactions  . Mellaril [Thioridazine] Other (See Comments)    States sinuses stops/ swell up   . Morphine And Related Itching  . Navane [Thiothixene] Other (See Comments)    GI Distress  . Thorazine [Chlorpromazine] Other (See Comments)    Sensitive to sunlight. Sunburns easily    Patient Measurements: Height: 6' (182.9 cm) Weight: 164 lb 14.5 oz (74.8 kg) IBW/kg (Calculated) : 77.6 Adjusted Body Weight:   Vital Signs: Temp: 95.7 F (35.4 C) (12/06 1200) Temp Source: Oral (12/06 1200) BP: 106/82 mmHg (12/06 1200) Pulse Rate: 99 (12/06 1200) Intake/Output from previous day: 12/05 0701 - 12/06 0700 In: 6145.8 [P.O.:2260; I.V.:3785.8; IV Piggyback:100] Out: 5400 [Urine:5400] Intake/Output from this shift: Total I/O In: 1629.2 [P.O.:1120; I.V.:459.2; IV Piggyback:50] Out: [id number] [Urine:3675]  Labs:  Recent Labs  05/06/15 1057 05/07/15 0420 05/08/15 0445  WBC 26.3* 40.6* 45.5*  HGB 14.1 11.8* 10.5*  PLT 223 118* 97*  CREATININE 2.54* 4.50* 3.86*   Estimated Creatinine Clearance: 21.5 mL/min (by C-G formula based on Cr of 3.86). No results for input(s): VANCOTROUGH, VANCOPEAK, VANCORANDOM, GENTTROUGH, GENTPEAK, GENTRANDOM, TOBRATROUGH, TOBRAPEAK, TOBRARND, AMIKACINPEAK, AMIKACINTROU, AMIKACIN in the last 72 hours.   Microbiology: Recent Results (from the past 720 hour(s))  Urine culture     Status: None (Preliminary result)   Collection Time: 05/06/15 11:12 AM  Result Value Ref Range Status   Specimen Description URINE, RANDOM  Final   Special Requests NONE  Final   Culture   Final    >=100,000 COLONIES/mL GRAM NEGATIVE RODS IDENTIFICATION AND SUSCEPTIBILITIES TO FOLLOW    Report Status PENDING  Incomplete  Culture, blood (routine x 2)     Status: None   Collection Time: 05/06/15  1:08 PM  Result Value Ref Range Status   Specimen Description BLOOD RIGHT  AC  Final   Special Requests BOTTLES DRAWN AEROBIC AND ANAEROBIC  3CC  Final   Culture  Setup Time   Final    GRAM NEGATIVE RODS IN BOTH AEROBIC AND ANAEROBIC BOTTLES CRITICAL RESULT CALLED TO, READ BACK BY AND VERIFIED WITH: CHAROLOTTE KYEI AT 2342 05/06/15.PMH CONFIRMED BY WM    Culture   Final    ESCHERICHIA COLI IN BOTH AEROBIC AND ANAEROBIC BOTTLES Results Called to: [facility] TRAVIS AT 0740 05/08/15 DV ESBL-EXTENDED SPECTRUM BETA LACTAMASE-THE ORGANISM IS RESISTANT TO PENICILLINS, CEPHALOSPORINS AND AZTREONAM ACCORDING TO CLSI M100-S15 VOL.[place].    Report Status 05/08/2015 FINAL  Final   Organism ID, Bacteria ESCHERICHIA COLI  Final      Susceptibility   Escherichia coli - MIC*    AMPICILLIN >=32 RESISTANT Resistant     CEFTAZIDIME 16 RESISTANT Resistant     CEFAZOLIN >=64 RESISTANT Resistant     CEFTRIAXONE >=64 RESISTANT Resistant     CIPROFLOXACIN >=4 RESISTANT Resistant     GENTAMICIN <=1 SENSITIVE Sensitive     IMIPENEM <=0.25 SENSITIVE Sensitive     TRIMETH/SULFA >=320 RESISTANT Resistant     Extended ESBL POSITIVE Resistant     PIP/TAZO Value in next row Intermediate      INTERMEDIATE64    * ESCHERICHIA COLI  Culture, blood (routine x 2)     Status: None   Collection Time: 05/06/15  1:13 PM  Result Value Ref Range Status   Specimen Description BLOOD LEFT AC  Final   Special Requests BOTTLES DRAWN AEROBIC AND ANAEROBIC  1CC  Final  Culture  Setup Time   Final    GRAM NEGATIVE RODS IN BOTH AEROBIC AND ANAEROBIC BOTTLES CRITICAL RESULT CALLED TO, READ BACK BY AND VERIFIED WITH: CHARLOTTE KYEI AT 2146 05/06/15.PMH CONFIRMED BY CAF    Culture   Final    ESCHERICHIA COLI IN BOTH AEROBIC AND ANAEROBIC BOTTLES Results Called to: [facility] TRAVIS AT 0740 05/08/15 DV ESBL-EXTENDED SPECTRUM BETA LACTAMASE-THE ORGANISM IS RESISTANT TO PENICILLINS, CEPHALOSPORINS AND AZTREONAM ACCORDING TO CLSI M100-S15 VOL.[place].    Report Status 05/08/2015 FINAL  Final    Organism ID, Bacteria ESCHERICHIA COLI  Final      Susceptibility   Escherichia coli - MIC*    AMPICILLIN >=32 RESISTANT Resistant     CEFTAZIDIME 16 RESISTANT Resistant     CEFAZOLIN >=64 RESISTANT Resistant     CEFTRIAXONE >=64 RESISTANT Resistant     CIPROFLOXACIN >=4 RESISTANT Resistant     GENTAMICIN <=1 SENSITIVE Sensitive     IMIPENEM <=0.25 SENSITIVE Sensitive     TRIMETH/SULFA >=320 RESISTANT Resistant     Extended ESBL POSITIVE Resistant     PIP/TAZO Value in next row Intermediate      INTERMEDIATE64    * ESCHERICHIA COLI  MRSA PCR Screening     Status: None   Collection Time: 05/07/15  7:14 AM  Result Value Ref Range Status   MRSA by PCR NEGATIVE NEGATIVE Final    Comment:        The GeneXpert MRSA Assay (FDA approved for NASAL specimens only), is one component of a comprehensive MRSA colonization surveillance program. It is not intended to diagnose MRSA infection nor to guide or monitor treatment for MRSA infections.     Medical History: Past Medical History  Diagnosis Date  . Mental disorder   . Seizures ([place])   . Myocardial infarction ([place])   . Stroke ([place])   . Asthma   . Paranoid schizophrenia ([place])   . Gastric ulcer   . External hemorrhoids   . Hypothyroidism     hypo  . Insomnia   . Depression   . Renal disorder     renal failure  . Hydronephrosis     Medications:  Prescriptions prior to admission  Medication Sig Dispense Refill Last Dose  . albuterol-ipratropium (COMBIVENT) 18-103 MCG/ACT inhaler Inhale 2 puffs into the lungs every 6 (six) hours as needed for wheezing.   prn at prn  . diphenhydrAMINE (BENADRYL) 50 MG capsule Take 50 mg by mouth at bedtime.   prn at prn  . FLUoxetine (PROZAC) 40 MG capsule Take 40 mg by mouth daily.   05/05/2015 at 0800  . Fluticasone-Salmeterol (ADVAIR) 250-50 MCG/DOSE AEPB Inhale 1 puff into the lungs 2 (two) times daily.   05/05/2015 at 0800  . levothyroxine (SYNTHROID, LEVOTHROID) 50 MCG tablet Take 50  mcg by mouth daily before breakfast.   05/05/2015 at 0700  . risperiDONE (RISPERDAL) 0.5 MG tablet Take 0.5 mg by mouth at bedtime.   05/05/2015 at 2000  . risperiDONE (RISPERDAL) 3 MG tablet Take 3 mg by mouth at bedtime.   05/05/2015 at 2000  . sodium bicarbonate 650 MG tablet Take 2 tablets (1,300 mg total) by mouth 2 (two) times daily. (Patient taking differently: Take 650 mg by mouth 2 (two) times daily. ) 20 tablet  05/06/2015 at 0800  . solifenacin (VESICARE) 5 MG tablet Take 5 mg by mouth daily.   05/05/2015 at 0800  . sulfamethoxazole-trimethoprim (BACTRIM,SEPTRA) 400-80 MG tablet Take 1 tablet by mouth  2 (two) times daily.   05/06/2015 at 0800  . tamsulosin (FLOMAX) 0.4 MG CAPS capsule Take 0.4 mg by mouth daily.   05/05/2015 at 2000   Assessment: 59 y/o M with sepsis from ESBL E coli.   Plan:  Continue meropenem 500 mg iv q 12 hours. Will continue to follow renal function.   Ulice Dash D 05/08/2015,3:08 PM

## 2015-05-08 NOTE — Progress Notes (Signed)
PT Cancellation Note  Patient Details Name: MAXYMILIAN SHERRATT MRN: [id number] DOB: 09/12/1955   Cancelled Treatment:    Reason Eval/Treat Not Completed: Patient at procedure or test/unavailable (PICC line).  Will check again tomorrow and see pt for assessment, but should be on hall by then.   Ramond Dial 05/08/2015, 4:30 PM   Mee Hives, PT MS Acute Rehab Dept. Number: [facility] [id number] and [place] [phone]

## 2015-05-08 NOTE — Clinical Documentation Improvement (Signed)
Internal Medicine at [facility]  (Please document query responses in the progress notes and discharge summary.  Responses documented on the CDI BPA forms are not codeable because BPA forms are not part of the permanent medical record.)  Query 1 of 2 Possible Clinical Conditions:  - Septic Shock requiring pressors, resolved  - Other condition  - Unable to clinically determine  Clinical Information: Hypotension requiring Levophed drip and transfer to ICU. Patient has also developed ATN from sepsis and hypotension per Dr. Wyatt Portela progress note 05/07/15.  Query 2 of 2 Cause and effect relationships cannot be assumed, and if present, must be documented by the attending physician.  Please document if the patient's Sepsis secondary to [facility] UTI:   - IS related to or secondary to his chronic indwelling foley catheter  - IS NOT related to or secondary to his chronic indwelling foley catheter  - Unable to determine if there is a cause and effect relationship  Please exercise your independent, professional judgment when responding. A specific answer is not anticipated or expected.   Thank You,  Erling Conte  RN BSN CCDS [phone] Health Information Management [place]

## 2015-05-08 NOTE — Care Management (Signed)
Spoke with Vinnie Level from patient's family care home. the phone number is correct- staff just forgot to turn off the fax machine.  She says that patient has had home IVs previously while in the family care home.  Agency that assisted was [facility].  Will give heads up referral next business day

## 2015-05-08 NOTE — Care Management (Signed)
Patient transferred to ICU from 2A  for hypotension.  Was placed on pressors.  Pressors have since been discontinued.  He is being followed by nephrology for acute renal failure.  Creatinine is trending down with IVFs.  Patient has a chronic foley and multidrug resistant UTI. May need to anticipate long term IV antibiotics.  PICC to be placed and recommendations will come from ID.  Found in documentation that patient has presented from [place].  The current phone number [phone] goes directly to a fax machine.  CM will speak with family care home staff regarding the possibility of need for home IV therapy.  Patient may require placement at a higher level of care to receive this therapy if it is identified as being needed.Referral is present for [facility]

## 2015-05-08 NOTE — Progress Notes (Signed)
[facility]  ROUNDING NOTE   Subjective:   Patient admitted to [facility] on 05/06/2015 for Pyelonephritis [N12] Acute renal failure, unspecified acute renal failure type ([place]) [N17.9]   Patient's baseline creatinine of 1.95 eGFR of 35 on 11/15. Follows with [facility]. Was last seen by our practice in 09/30/2012.   Patient found to have positive urine culture with E. Coli. Now on meropenem. He was on norepinephrine until last night.   Nephrology consulted for acute renal failure with creatinine 4.5 yesterday. Started on IV NS at 100. UOP 5400 and creatinine trending downward to 3.86  Objective:  Vital signs in last 24 hours:  Temp:  [98.1 F (36.7 C)-98.8 F (37.1 C)] 98.6 F (37 C) (12/06 0800) Pulse Rate:  [42-109] 95 (12/06 0700) Resp:  [21-31] 24 (12/06 0700) BP: (88-122)/(67-85) 118/82 mmHg (12/06 0700) SpO2:  [93 %-100 %] 95 % (12/06 0700)  Weight change:  Filed Weights   05/06/15 1054 05/06/15 1853 05/07/15 0700  Weight: 70.761 kg (156 lb) 71.668 kg (158 lb) 74.8 kg (164 lb 14.5 oz)    Intake/Output: I/O last 3 completed shifts: In: 6977.4 [P.O.:2260; I.V.:4567.4; IV Piggyback:150] Out: 5784 [Urine:5700]   Intake/Output this shift:     Physical Exam: General: NAD, sitting up in bed.   Head: Normocephalic, atraumatic. Moist oral mucosal membranes  Eyes: Anicteric, PERRL  Neck: Supple, trachea midline  Lungs:  Clear to auscultation  Heart: Regular rate and rhythm  Abdomen:  Soft, nontender,   Extremities:  no peripheral edema.  Neurologic: Nonfocal, moving all four extremities  Skin: No lesions       Basic Metabolic Panel:  Recent Labs Lab 05/06/15 1057 05/07/15 0420 05/08/15 0445  NA 142 135 130*  K 4.5 4.2 4.3  CL 109 111 107  CO2 18* 17* 16*  GLUCOSE 112* 105* 83  BUN 34* 50* 54*  CREATININE 2.54* 4.50* 3.86*  CALCIUM 9.7 8.0* 7.3*    Liver Function Tests:  Recent Labs Lab 05/06/15 1057  AST 26  ALT 20  ALKPHOS 106   BILITOT 0.4  PROT 7.2  ALBUMIN 3.8    Recent Labs Lab 05/06/15 1057  LIPASE 30   No results for input(s): AMMONIA in the last 168 hours.  CBC:  Recent Labs Lab 05/06/15 1057 05/07/15 0420 05/08/15 0445  WBC 26.3* 40.6* 45.5*  HGB 14.1 11.8* 10.5*  HCT 43.6 35.6* 32.4*  MCV 91.7 92.7 91.7  PLT 223 118* 97*    Cardiac Enzymes: No results for input(s): CKTOTAL, CKMB, CKMBINDEX, TROPONINI in the last 168 hours.  BNP: Invalid input(s): POCBNP  CBG: No results for input(s): GLUCAP in the last 168 hours.  Microbiology: Results for orders placed or performed during the hospital encounter of 05/06/15  Urine culture     Status: None (Preliminary result)   Collection Time: 05/06/15 11:12 AM  Result Value Ref Range Status   Specimen Description URINE, RANDOM  Final   Special Requests NONE  Final   Culture HOLDING FOR POSSIBLE PATHOGEN  Final   Report Status PENDING  Incomplete  Culture, blood (routine x 2)     Status: None   Collection Time: 05/06/15  1:08 PM  Result Value Ref Range Status   Specimen Description BLOOD RIGHT AC  Final   Special Requests BOTTLES DRAWN AEROBIC AND ANAEROBIC  3CC  Final   Culture  Setup Time   Final    GRAM NEGATIVE RODS IN BOTH AEROBIC AND ANAEROBIC BOTTLES CRITICAL RESULT CALLED TO,  READ BACK BY AND VERIFIED WITH: CHAROLOTTE KYEI AT 2342 05/06/15.PMH CONFIRMED BY WM    Culture   Final    ESCHERICHIA COLI IN BOTH AEROBIC AND ANAEROBIC BOTTLES Results Called to: [facility] TRAVIS AT 0740 05/08/15 DV ESBL-EXTENDED SPECTRUM BETA LACTAMASE-THE ORGANISM IS RESISTANT TO PENICILLINS, CEPHALOSPORINS AND AZTREONAM ACCORDING TO CLSI M100-S15 VOL.[place].    Report Status 05/08/2015 FINAL  Final   Organism ID, Bacteria ESCHERICHIA COLI  Final      Susceptibility   Escherichia coli - MIC*    AMPICILLIN >=32 RESISTANT Resistant     CEFTAZIDIME 16 RESISTANT Resistant     CEFAZOLIN >=64 RESISTANT Resistant     CEFTRIAXONE >=64 RESISTANT  Resistant     CIPROFLOXACIN >=4 RESISTANT Resistant     GENTAMICIN <=1 SENSITIVE Sensitive     IMIPENEM <=0.25 SENSITIVE Sensitive     TRIMETH/SULFA >=320 RESISTANT Resistant     Extended ESBL POSITIVE Resistant     PIP/TAZO Value in next row Intermediate      INTERMEDIATE64    * ESCHERICHIA COLI  Culture, blood (routine x 2)     Status: None   Collection Time: 05/06/15  1:13 PM  Result Value Ref Range Status   Specimen Description BLOOD LEFT AC  Final   Special Requests BOTTLES DRAWN AEROBIC AND ANAEROBIC  1CC  Final   Culture  Setup Time   Final    GRAM NEGATIVE RODS IN BOTH AEROBIC AND ANAEROBIC BOTTLES CRITICAL RESULT CALLED TO, READ BACK BY AND VERIFIED WITH: CHARLOTTE KYEI AT 2146 05/06/15.PMH CONFIRMED BY CAF    Culture   Final    ESCHERICHIA COLI IN BOTH AEROBIC AND ANAEROBIC BOTTLES Results Called to: [facility] TRAVIS AT 0740 05/08/15 DV ESBL-EXTENDED SPECTRUM BETA LACTAMASE-THE ORGANISM IS RESISTANT TO PENICILLINS, CEPHALOSPORINS AND AZTREONAM ACCORDING TO CLSI M100-S15 VOL.[place].    Report Status 05/08/2015 FINAL  Final   Organism ID, Bacteria ESCHERICHIA COLI  Final      Susceptibility   Escherichia coli - MIC*    AMPICILLIN >=32 RESISTANT Resistant     CEFTAZIDIME 16 RESISTANT Resistant     CEFAZOLIN >=64 RESISTANT Resistant     CEFTRIAXONE >=64 RESISTANT Resistant     CIPROFLOXACIN >=4 RESISTANT Resistant     GENTAMICIN <=1 SENSITIVE Sensitive     IMIPENEM <=0.25 SENSITIVE Sensitive     TRIMETH/SULFA >=320 RESISTANT Resistant     Extended ESBL POSITIVE Resistant     PIP/TAZO Value in next row Intermediate      INTERMEDIATE64    * ESCHERICHIA COLI  MRSA PCR Screening     Status: None   Collection Time: 05/07/15  7:14 AM  Result Value Ref Range Status   MRSA by PCR NEGATIVE NEGATIVE Final    Comment:        The GeneXpert MRSA Assay (FDA approved for NASAL specimens only), is one component of a comprehensive MRSA colonization surveillance program.  It is not intended to diagnose MRSA infection nor to guide or monitor treatment for MRSA infections.     Coagulation Studies: No results for input(s): LABPROT, INR in the last 72 hours.  Urinalysis:  Recent Labs  05/06/15 1112  COLORURINE YELLOW*  LABSPEC 1.009  PHURINE 9.0*  GLUCOSEU NEGATIVE  HGBUR 3+*  BILIRUBINUR NEGATIVE  KETONESUR NEGATIVE  PROTEINUR 100*  NITRITE POSITIVE*  LEUKOCYTESUR 3+*      Imaging: Dg Chest Port 1 View  05/07/2015  CLINICAL DATA:  Central catheter placement EXAM: PORTABLE  CHEST 1 VIEW COMPARISON:  January 17, 2015 FINDINGS: Central catheter tip is at the cavoatrial junction. No pneumothorax. There is a small area of infiltrate in the right lower lobe. Lungs elsewhere clear. Heart size and pulmonary vascularity are normal. No adenopathy. No bone lesions. IMPRESSION: Central catheter tip at cavoatrial junction. No pneumothorax. Small area of of infiltrate right base. Electronically Signed   By: Lowella Grip III M.D.   On: 05/07/2015 10:13     Medications:   . sodium chloride 100 mL/hr at 05/08/15 0757   . darifenacin  7.5 mg Oral Daily  . FLUoxetine  40 mg Oral Daily  . heparin subcutaneous  5,000 Units Subcutaneous 3 times per day  . levothyroxine  50 mcg Oral QAC breakfast  . meropenem (MERREM) IV  500 mg Intravenous Q12H  . mometasone-formoterol  2 puff Inhalation BID  . risperiDONE  3 mg Oral QHS  . sodium chloride  10-40 mL Intracatheter Q12H  . tamsulosin  0.4 mg Oral Daily   acetaminophen **OR** acetaminophen, HYDROmorphone (DILAUDID) injection, ondansetron **OR** ondansetron (ZOFRAN) IV, opium-belladonna, sodium chloride, sodium chloride  Assessment/ Plan:  [name] is a 59 y.o. white male with schizophrenia, seizure disorder, CVA, coronary artery disease, COPD, peptic ulcer disease, hypothyroidism, right nephrectomy, nephrolithiasis and neurogenic bladder.   1. 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 Acute renal failure from sepsis, urinary tract infection and possibly obstructive uropathy.  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  Nonoliguric urine output. Serum electrolytes are stable. No acute indication for dialysis at this time.  Recommend decreasing IV fluids and monitoring renal function, urine output, and serum electrolytes.   2. 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 Most likely exacerbated by normal saline. Will transition him to bicarb gtt at 65m/hr  3. 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 Will continue to monitor. Volume status euvolemic.   4. Sepsis: renally dosed meropenem. E. Coli on urine culture. Now off vasopressors.    LOS: 2[place] [id number]/6/20169:26 AM

## 2015-05-08 NOTE — Progress Notes (Signed)
[place] [facility] INFECTIOUS DISEASE PROGRESS NOTE Date of Admission:  05/06/2015     ID: KAEL GREENEY is a 59 y.o. male with ESBL E coli urosepsis Active Problems:   Sepsis ([place])   Subjective: Off pressors, HD stable. Reports feeling better. Urine clear. No diarrhea or abd pain.   ROS  Eleven systems are reviewed and negative except per hpi  Medications:  Antibiotics Given (last 72 hours)    Date/Time Action Medication Dose Rate   05/06/15 2119 Given   ertapenem (INVANZ) 1 g in sodium chloride 0.9 % 50 mL IVPB 1 g 100 mL/hr   05/07/15 2100 Given   meropenem (MERREM) 500 mg in sodium chloride 0.9 % 50 mL IVPB 500 mg 100 mL/hr   05/08/15 1059 Given   meropenem (MERREM) 500 mg in sodium chloride 0.9 % 50 mL IVPB 500 mg 100 mL/hr     . darifenacin  7.5 mg Oral Daily  . feeding supplement (ENSURE ENLIVE)  237 mL Oral BID WC  . FLUoxetine  40 mg Oral Daily  . furosemide      . heparin subcutaneous  5,000 Units Subcutaneous 3 times per day  . levothyroxine  50 mcg Oral QAC breakfast  . meropenem (MERREM) IV  500 mg Intravenous Q12H  . mometasone-formoterol  2 puff Inhalation BID  . risperiDONE  3 mg Oral QHS  . sodium chloride  10-40 mL Intracatheter Q12H  . tamsulosin  0.4 mg Oral Daily    Objective: Vital signs in last 24 hours: Temp:  [95.7 F (35.4 C)-98.8 F (37.1 C)] 95.7 F (35.4 C) (12/06 1200) Pulse Rate:  [42-109] 99 (12/06 1200) Resp:  [20-31] 20 (12/06 1200) BP: (98-122)/(67-85) 106/82 mmHg (12/06 1200) SpO2:  [93 %-97 %] 96 % (12/06 1200) GENERAL: 58 y.o.-year-old patient lying in the bed with no acute distress.EYES: Pupils equal, round, reactive to light and accommodation. No scleral icterus. Extraocular muscles intact.  HEENT: Head atraumatic, normocephalic. Oropharynx and nasopharynx clear.  NECK: Supple, no jugular venous distention. No thyroid enlargement, no tenderness.  LUNGS: Normal breath sounds bilaterally, no wheezing, rales,rhonchi or  crepitation. No use of accessory muscles of respiration.  CARDIOVASCULAR: S1, S2 normal. No murmurs, rubs, or gallops.  ABDOMEN: Soft, nontender, nondistended. Bowel sounds present. No organomegaly or mass. Some suprapubic discomfort on palpation noted. Has a chronic indwelling Foley catheter. EXTREMITIES: No pedal edema, cyanosis, or clubbing.  NEUROLOGIC: Cranial nerves II through XII are intact. Muscle strength 5/5 in all extremities. Sensation intact. Gait not checked.  PSYCHIATRIC: The patient is alert and oriented x 3.  SKIN: No obvious rash, lesion, or ulcer Lab Results  Recent Labs  05/07/15 0420 05/08/15 0445  WBC 40.6* 45.5*  HGB 11.8* 10.5*  HCT 35.6* 32.4*  NA 135 130*  K 4.2 4.3  CL 111 107  CO2 17* 16*  BUN 50* 54*  CREATININE 4.50* 3.86*    Microbiology: Results for orders placed or performed during the hospital encounter of 05/06/15  Urine culture     Status: None (Preliminary result)   Collection Time: 05/06/15 11:12 AM  Result Value Ref Range Status   Specimen Description URINE, RANDOM  Final   Special Requests NONE  Final   Culture   Final    >=100,000 COLONIES/mL GRAM NEGATIVE RODS IDENTIFICATION AND SUSCEPTIBILITIES TO FOLLOW    Report Status PENDING  Incomplete  Culture, blood (routine x 2)     Status: None   Collection Time: 05/06/15  1:08 PM  Result  Value Ref Range Status   Specimen Description BLOOD RIGHT AC  Final   Special Requests BOTTLES DRAWN AEROBIC AND ANAEROBIC  3CC  Final   Culture  Setup Time   Final    GRAM NEGATIVE RODS IN BOTH AEROBIC AND ANAEROBIC BOTTLES CRITICAL RESULT CALLED TO, READ BACK BY AND VERIFIED WITH: CHAROLOTTE KYEI AT 2342 05/06/15.PMH CONFIRMED BY WM    Culture   Final    ESCHERICHIA COLI IN BOTH AEROBIC AND ANAEROBIC BOTTLES Results Called to: [facility] TRAVIS AT 0740 05/08/15 DV ESBL-EXTENDED SPECTRUM BETA LACTAMASE-THE ORGANISM IS RESISTANT TO PENICILLINS, CEPHALOSPORINS AND AZTREONAM ACCORDING TO CLSI  M100-S15 VOL.[place].    Report Status 05/08/2015 FINAL  Final   Organism ID, Bacteria ESCHERICHIA COLI  Final      Susceptibility   Escherichia coli - MIC*    AMPICILLIN >=32 RESISTANT Resistant     CEFTAZIDIME 16 RESISTANT Resistant     CEFAZOLIN >=64 RESISTANT Resistant     CEFTRIAXONE >=64 RESISTANT Resistant     CIPROFLOXACIN >=4 RESISTANT Resistant     GENTAMICIN <=1 SENSITIVE Sensitive     IMIPENEM <=0.25 SENSITIVE Sensitive     TRIMETH/SULFA >=320 RESISTANT Resistant     Extended ESBL POSITIVE Resistant     PIP/TAZO Value in next row Intermediate      INTERMEDIATE64    * ESCHERICHIA COLI  Culture, blood (routine x 2)     Status: None   Collection Time: 05/06/15  1:13 PM  Result Value Ref Range Status   Specimen Description BLOOD LEFT AC  Final   Special Requests BOTTLES DRAWN AEROBIC AND ANAEROBIC  1CC  Final   Culture  Setup Time   Final    GRAM NEGATIVE RODS IN BOTH AEROBIC AND ANAEROBIC BOTTLES CRITICAL RESULT CALLED TO, READ BACK BY AND VERIFIED WITH: CHARLOTTE KYEI AT 2146 05/06/15.PMH CONFIRMED BY [facility]    Culture   Final    ESCHERICHIA COLI IN BOTH AEROBIC AND ANAEROBIC BOTTLES Results Called to: [facility] TRAVIS AT 0740 05/08/15 DV ESBL-EXTENDED SPECTRUM BETA LACTAMASE-THE ORGANISM IS RESISTANT TO PENICILLINS, CEPHALOSPORINS AND AZTREONAM ACCORDING TO CLSI M100-S15 VOL.[place].    Report Status 05/08/2015 FINAL  Final   Organism ID, Bacteria ESCHERICHIA COLI  Final      Susceptibility   Escherichia coli - MIC*    AMPICILLIN >=32 RESISTANT Resistant     CEFTAZIDIME 16 RESISTANT Resistant     CEFAZOLIN >=64 RESISTANT Resistant     CEFTRIAXONE >=64 RESISTANT Resistant     CIPROFLOXACIN >=4 RESISTANT Resistant     GENTAMICIN <=1 SENSITIVE Sensitive     IMIPENEM <=0.25 SENSITIVE Sensitive     TRIMETH/SULFA >=320 RESISTANT Resistant     Extended ESBL POSITIVE Resistant     PIP/TAZO Value in next row Intermediate      INTERMEDIATE64    *  ESCHERICHIA COLI  MRSA PCR Screening     Status: None   Collection Time: 05/07/15  7:14 AM  Result Value Ref Range Status   MRSA by PCR NEGATIVE NEGATIVE Final    Comment:        The GeneXpert MRSA Assay (FDA approved for NASAL specimens only), is one component of a comprehensive MRSA colonization surveillance program. It is not intended to diagnose MRSA infection nor to guide or monitor treatment for MRSA infections.     Studies/Results: US Renal  05/08/2015  CLINICAL DATA:  Acute renal failure.  Prior right nephrectomy EXAM: RENAL / URINARY TRACT ULTRASOUND  COMPLETE COMPARISON:  CT 11/06/2012 FINDINGS: Right Kidney: Length: Prior right nephrectomy. Left Kidney: Length: 11.8 cm. Calcification in the midpole measures 12 mm. This is located peripherally and could be parenchymal calcification or within the collecting system in an area of scarring and overlying cortical thinning. No hydronephrosis. Bladder: Decompressed with Foley catheter in place. IMPRESSION: Prior right nephrectomy. No hydronephrosis on the left. Parenchymal calcification versus nonobstructing 12 mm midpole stone on the left as described above. Electronically Signed   By: Rolm Baptise M.D.   On: 05/08/2015 10:08   Dg Chest Port 1 View  05/08/2015  CLINICAL DATA:  Status post central line placement. EXAM: PORTABLE CHEST 1 VIEW COMPARISON:  May 07, 2015. FINDINGS: The heart size and mediastinal contours are within normal limits. No pneumothorax or pleural effusion is noted. Increased central pulmonary vascular congestion is noted with probable bilateral perihilar edema. Stable right basilar opacity is noted concerning for pneumonia or atelectasis. Right internal jugular catheter is unchanged in position with distal tip in the expected position the cavoatrial junction. There is been interval placement of right-sided PICC line with distal tip in the right atrium. The visualized skeletal structures are unremarkable. IMPRESSION:  Increased central pulmonary vascular congestion is noted with probable bilateral perihilar edema. Stable right basilar opacity is noted concerning for pneumonia or subsegmental atelectasis. Interval placement of right-sided PICC line with distal tip in the expected position of the right atrium. Withdrawal by 3-4 cm is recommended. These results will be called to the ordering clinician or representative by the Radiologist Assistant, and communication documented in the PACS or zVision Dashboard. Electronically Signed   By: Marijo Conception, M.D.   On: 05/08/2015 15:01   Dg Chest Port 1 View  05/07/2015  CLINICAL DATA:  Central catheter placement EXAM: PORTABLE CHEST 1 VIEW COMPARISON:  January 17, 2015 FINDINGS: Central catheter tip is at the cavoatrial junction. No pneumothorax. There is a small area of infiltrate in the right lower lobe. Lungs elsewhere clear. Heart size and pulmonary vascularity are normal. No adenopathy. No bone lesions. IMPRESSION: Central catheter tip at cavoatrial junction. No pneumothorax. Small area of of infiltrate right base. Electronically Signed   By: Lowella Grip III M.D.   On: 05/07/2015 10:13    Assessment/Plan: KORAN RUMLER is a 59 y.o. 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 He was seen by urology in Nov and placed on bactrim suppression. Admitted with cloudy urine, some possible foley clotting, fevers and leukocytosis. He has ESBL E coli bacteremia. Has hx MDRO UTI and current cx with GNR. FOley exchanged and urine now clear. HD stable, no fevers but wbc markedly elevated. No diarrhea to suggest C diff. Renal USS neg for obstruction hydronephrosis or renal abscess   Recommendations Cont carbapenem - can change  meropenem back to ertapenem if desired since cx known Unclear why wbc so elevatred as he is improving clinically, has no evidence of abscess and no suggestion of C diff. If  remains elevated will need CT abd pelvis but hesitant to do with AOCKD.  Strongly consider seeing if pt Lucianne Lei do without the Cath. Perhaps once out of unit remove foley and use Intermittent cath . Can see if this can be done at facility he is at. He has seen urology at [facility] Thank you very much for the consult. Will follow with you.  [place], [place]   05/08/2015, 3:32 PM

## 2015-05-08 NOTE — Progress Notes (Signed)
[place] [facility] [place] NAME: Alan Small    MR#:  [id number]  DATE OF BIRTH:  05-01-1956  SUBJECTIVE:  CHIEF COMPLAINT:   Chief Complaint  Patient presents with  . Abdominal Pain   - Off pressors since morning. The pressure is much better. Bladder spasms have improved. -Blood cultures growing ESBL Escherichia coli  REVIEW OF SYSTEMS:  Review of Systems  Constitutional: Negative for fever and chills.  Respiratory: Negative for cough, shortness of breath and wheezing.   Cardiovascular: Negative for chest pain and palpitations.  Gastrointestinal: Negative for nausea, vomiting, abdominal pain, diarrhea and constipation.  Genitourinary: Negative for dysuria.       Improved Bladder spasms  Neurological: Negative for dizziness, seizures and headaches.    DRUG ALLERGIES:   Allergies  Allergen Reactions  . Mellaril [Thioridazine] Other (See Comments)    States sinuses stops/ swell up   . Morphine And Related Itching  . Navane [Thiothixene] Other (See Comments)    GI Distress  . Thorazine [Chlorpromazine] Other (See Comments)    Sensitive to sunlight. Sunburns easily    VITALS:  Blood pressure 118/82, pulse 95, temperature 98.6 F (37 C), temperature source Oral, resp. rate 24, height 6' (1.829 m), weight 74.8 kg (164 lb 14.5 oz), SpO2 95 %.  PHYSICAL EXAMINATION:  Physical Exam  GENERAL:  59 y.o.-year-old patient lying in the bed with no acute distress. disshelved. EYES: Pupils equal, round, reactive to light and accommodation. No scleral icterus. Extraocular muscles intact.  HEENT: Head atraumatic, normocephalic. Oropharynx and nasopharynx clear.  NECK:  Supple, no jugular venous distention. No thyroid enlargement, no tenderness.  LUNGS: Normal breath sounds bilaterally, no wheezing, rales,rhonchi or crepitation. No use of accessory muscles of respiration.  CARDIOVASCULAR: S1, S2 normal. No murmurs, rubs, or gallops.   ABDOMEN: Soft, nontender, nondistended. Bowel sounds present. No organomegaly or mass. Some suprapubic discomfort on palpation noted. Has a chronic indwelling Foley catheter. Catheter last changed on 05/07/15- some minimal bleeding around urethral orifice noted EXTREMITIES: No pedal edema, cyanosis, or clubbing.  NEUROLOGIC: Cranial nerves II through XII are intact. Muscle strength 5/5 in all extremities. Sensation intact. Gait not checked.  PSYCHIATRIC: The patient is alert and oriented x 3.  SKIN: No obvious rash, lesion, or ulcer.    LABORATORY PANEL:   CBC  Recent Labs Lab 05/08/15 0445  WBC 45.5*  HGB 10.5*  HCT 32.4*  PLT 97*   ------------------------------------------------------------------------------------------------------------------  Chemistries   Recent Labs Lab 05/06/15 1057  05/08/15 0445  NA 142  < > 130*  K 4.5  < > 4.3  CL 109  < > 107  CO2 18*  < > 16*  GLUCOSE 112*  < > 83  BUN 34*  < > 54*  CREATININE 2.54*  < > 3.86*  CALCIUM 9.7  < > 7.3*  AST 26  --   --   ALT 20  --   --   ALKPHOS 106  --   --   BILITOT 0.4  --   --   < > = values in this interval not displayed. ------------------------------------------------------------------------------------------------------------------  Cardiac Enzymes No results for input(s): TROPONINI in the last 168 hours. ------------------------------------------------------------------------------------------------------------------  RADIOLOGY:  Dg Chest Port 1 View  05/07/2015  CLINICAL DATA:  Central catheter placement EXAM: PORTABLE CHEST 1 VIEW COMPARISON:  January 17, 2015 FINDINGS: Central catheter tip is at the cavoatrial junction. No pneumothorax. There is a small area of infiltrate in  the right lower lobe. Lungs elsewhere clear. Heart size and pulmonary vascularity are normal. No adenopathy. No bone lesions. IMPRESSION: Central catheter tip at cavoatrial junction. No pneumothorax. Small area of of  infiltrate right base. Electronically Signed   By: Lowella Grip III M.D.   On: 05/07/2015 10:13    EKG:   Orders placed or performed during the hospital encounter of 02/22/15  . EKG    ASSESSMENT AND PLAN:   59 year old male with past medical history of paranoid schizophrenia, hypothyroidism, history of chronic indwelling Foley catheter, history of multidrug resistant UTI, depression who presents to the hospital due to abdominal pain and noted to have sepsis secondary to UTI.   #1 sepsis-the cultures growing ESBL Escherichia coli, pending urine cultures --Continue IV fluids, off Levophed now. -Due to a  history of multidrug resistant UTI -  on IV  meropenem . -ID consult appreciated -We'll order a PICC line  #2 urinary tract infection-patient is high risk given his chronic indwelling Foley. -Patient has a history of a multidrug resistant Escherichia coli, Acinetobacter UTI.  -On IV meropenem. Cultures are still pending -Significant bladder spasms-so started belladonna opioids suppositories    #3 Acute renal failure on CKD-follows with nephrology at [facility]. -Baseline GFR seems to be around 30, stage III CKD, baseline creatinine around 2.1. Creatinine and GFR worsened. -Avoid nephrotoxins. Gentle hydration. -Likely ATN from sepsis and hypotension  -Pending nephrology consult. Patient is status post right nephrectomy for a nonfunctioning kidney.   #4 COPD-no acute exacerbation. -Continue home inhalers.  #5 paranoid schizophrenia-continue risperidone  -On Prozac for depression    #6 DVT prophylaxis-on subcutaneous heparin   Transfer out of ICU. Physical therapy consult. Uses a cane at baseline.  All the records are reviewed and case discussed with Care Management/Social Workerr. Management plans discussed with the patient, family and they are in agreement.  CODE STATUS: Full Code  TOTAL  TIME SPENT IN TAKING CARE OF THIS PATIENT: 37 minutes.   POSSIBLE D/C IN 1-2  DAYS, DEPENDING ON CLINICAL CONDITION.   Gladstone Lighter M.D on 05/08/2015 at 9:09 AM  Between 7am to 6pm - Pager - [phone]  After 6pm go to [email or web address] - password [email or web address] [facility] [place] [facility]  Office  [phone]  CC: Primary care physician; [name], MD

## 2015-05-09 DIAGNOSIS — K623 Rectal prolapse: Secondary | ICD-10-CM

## 2015-05-09 DIAGNOSIS — E440 Moderate protein-calorie malnutrition: Secondary | ICD-10-CM | POA: Insufficient documentation

## 2015-05-09 LAB — CBC
HCT: 32.7 % — ABNORMAL LOW (ref 40.0–52.0)
Hemoglobin: 11 g/dL — ABNORMAL LOW (ref 13.0–18.0)
MCH: 30.5 pg (ref 26.0–34.0)
MCHC: 33.5 g/dL (ref 32.0–36.0)
MCV: 90.8 fL (ref 80.0–100.0)
PLATELETS: 90 10*3/uL — AB (ref 150–440)
RBC: 3.6 MIL/uL — ABNORMAL LOW (ref 4.40–5.90)
RDW: 15.1 % — AB (ref 11.5–14.5)
WBC: 35.2 10*3/uL — ABNORMAL HIGH (ref 3.8–10.6)

## 2015-05-09 LAB — BASIC METABOLIC PANEL
Anion gap: 6 (ref 5–15)
BUN: 46 mg/dL — AB (ref 6–20)
CALCIUM: 8.1 mg/dL — AB (ref 8.9–10.3)
CO2: 20 mmol/L — ABNORMAL LOW (ref 22–32)
Chloride: 112 mmol/L — ABNORMAL HIGH (ref 101–111)
Creatinine, Ser: 2.65 mg/dL — ABNORMAL HIGH (ref 0.61–1.24)
GFR calc Af Amer: 28 mL/min — ABNORMAL LOW (ref >60)
GFR, EST NON AFRICAN AMERICAN: 25 mL/min — AB (ref >60)
GLUCOSE: 117 mg/dL — AB (ref 65–99)
Potassium: 4.1 mmol/L (ref 3.5–5.1)
Sodium: 138 mmol/L (ref 135–145)

## 2015-05-09 MED ORDER — HYDROCORTISONE 2.5 % RE CREA
TOPICAL_CREAM | Freq: Two times a day (BID) | RECTAL | Status: DC
  Administered 2015-05-09 – 2015-05-13 (×8): via RECTAL
  Filled 2015-05-09: qty 28.35

## 2015-05-09 NOTE — Consult Note (Signed)
Surgical Consultation  05/09/2015  Alan Small is an 59 y.o. male.   CC: Rectal prolapse  HPI: This a patient who I was called urgently to the ICU for a patient with a rectal prolapse. Apparently he had a pop out while having a bowel movement. He has subsequently been moved to the medical floor where he is seen and examined. Prior to seeing the patient however I went to the ICU in discussed his care with his ICU nurse and intensivist. Patient states that he's had this happen before in fact it's been going on since he was 59 years old he is 59 years old now. He denies melena or hematochezia denies any pain.  Past Medical History  Diagnosis Date  . Mental disorder   . Seizures ([place])   . Myocardial infarction ([place])   . Stroke ([place])   . Asthma   . Paranoid schizophrenia ([place])   . Gastric ulcer   . External hemorrhoids   . Hypothyroidism     hypo  . Insomnia   . Depression   . Renal disorder     renal failure  . Hydronephrosis     Past Surgical History  Procedure Laterality Date  . Appendectomy    . Colonoscopy with propofol N/A 12/07/2014    Procedure: COLONOSCOPY WITH PROPOFOL;  Surgeon: Manya Silvas, MD;  Location: [facility] ENDOSCOPY;  Service: Endoscopy;  Laterality: N/A;  . Esophagogastroduodenoscopy N/A 12/07/2014    Procedure: ESOPHAGOGASTRODUODENOSCOPY (EGD);  Surgeon: Manya Silvas, MD;  Location: [facility] ENDOSCOPY;  Service: Endoscopy;  Laterality: N/A;  . Nephrectomy      Family History  Problem Relation Age of Onset  . Stroke Father   . Pneumonia Father   . Brain cancer Mother     Social History:  reports that he has been smoking Cigarettes.  He has a 55 pack-year smoking history. He has quit using smokeless tobacco. His smokeless tobacco use included Chew. He reports that he does not drink alcohol or use illicit drugs.  Allergies:  Allergies  Allergen Reactions  . Mellaril [Thioridazine] Other (See Comments)    States sinuses stops/ swell up   . Morphine  And Related Itching  . Navane [Thiothixene] Other (See Comments)    GI Distress  . Thorazine [Chlorpromazine] Other (See Comments)    Sensitive to sunlight. Sunburns easily    Medications reviewed.   Review of Systems:   Review of Systems  Constitutional: Negative.   HENT: Negative.   Eyes: Negative.   Respiratory: Negative.   Cardiovascular: Negative.   Gastrointestinal: Negative for heartburn, nausea, vomiting, abdominal pain, blood in stool and melena.  Genitourinary: Negative.   Musculoskeletal: Negative.   Skin: Negative.   Neurological: Negative.   Endo/Heme/Allergies: Negative.   Psychiatric/Behavioral: Negative.      Physical Exam:  BP 142/100 mmHg  Pulse 105  Temp(Src) 97.4 F (36.3 C) (Oral)  Resp 33  Ht 6' (1.829 m)  Wt 164 lb 14.5 oz (74.8 kg)  BMI 22.36 kg/m2  SpO2 96%  Physical Exam  Constitutional: No distress.  Disheveled  HENT:  Head: Normocephalic and atraumatic.  Poor dentition  Eyes: Right eye exhibits no discharge. Left eye exhibits no discharge. No scleral icterus.  Cardiovascular: Normal rate and regular rhythm.   Pulmonary/Chest: Effort normal. No respiratory distress. He has wheezes. He has no rales.  Abdominal: Soft. There is no tenderness.  Genitourinary:  No visible hemorrhoids and no prolapse at the time of the exam. No erythema nontender  internal hemorrhoids  Musculoskeletal: He exhibits no edema.  Lymphadenopathy:    He has no cervical adenopathy.  Neurological: He is alert.  Skin: Skin is warm and dry. He is not diaphoretic. No erythema.  Psychiatric: Affect normal.  Vitals reviewed.     Results for orders placed or performed during the hospital encounter of 05/06/15 (from the past 48 hour(s))  CBC     Status: Abnormal   Collection Time: 05/08/15  4:45 AM  Result Value Ref Range   WBC 45.5 (H) 3.8 - 10.6 K/uL   RBC 3.54 (L) 4.40 - 5.90 MIL/uL   Hemoglobin 10.5 (L) 13.0 - 18.0 g/dL   HCT 32.4 (L) 40.0 - 52.0 %   MCV  91.7 80.0 - 100.0 fL   MCH 29.8 26.0 - 34.0 pg   MCHC 32.5 32.0 - 36.0 g/dL   RDW 15.3 (H) 11.5 - 14.5 %   Platelets 97 (L) 150 - 440 K/uL  Basic metabolic panel     Status: Abnormal   Collection Time: 05/08/15  4:45 AM  Result Value Ref Range   Sodium 130 (L) 135 - 145 mmol/L   Potassium 4.3 3.5 - 5.1 mmol/L   Chloride 107 101 - 111 mmol/L   CO2 16 (L) 22 - 32 mmol/L   Glucose, Bld 83 65 - 99 mg/dL   BUN 54 (H) 6 - 20 mg/dL   Creatinine, Ser 3.86 (H) 0.61 - 1.24 mg/dL   Calcium 7.3 (L) 8.9 - 10.3 mg/dL   GFR calc non Af Amer 16 (L) >60 mL/min   GFR calc Af Amer 18 (L) >60 mL/min    Comment: (NOTE) The eGFR has been calculated using the CKD EPI equation. This calculation has not been validated in all clinical situations. eGFR's persistently <60 mL/min signify possible Chronic Kidney Disease.    Anion gap 7 5 - 15  CBC     Status: Abnormal   Collection Time: 05/09/15  4:20 AM  Result Value Ref Range   WBC 35.2 (H) 3.8 - 10.6 K/uL   RBC 3.60 (L) 4.40 - 5.90 MIL/uL   Hemoglobin 11.0 (L) 13.0 - 18.0 g/dL   HCT 32.7 (L) 40.0 - 52.0 %   MCV 90.8 80.0 - 100.0 fL   MCH 30.5 26.0 - 34.0 pg   MCHC 33.5 32.0 - 36.0 g/dL   RDW 15.1 (H) 11.5 - 14.5 %   Platelets 90 (L) 150 - 440 K/uL  Basic metabolic panel     Status: Abnormal   Collection Time: 05/09/15  4:20 AM  Result Value Ref Range   Sodium 138 135 - 145 mmol/L   Potassium 4.1 3.5 - 5.1 mmol/L   Chloride 112 (H) 101 - 111 mmol/L   CO2 20 (L) 22 - 32 mmol/L   Glucose, Bld 117 (H) 65 - 99 mg/dL   BUN 46 (H) 6 - 20 mg/dL   Creatinine, Ser 2.65 (H) 0.61 - 1.24 mg/dL   Calcium 8.1 (L) 8.9 - 10.3 mg/dL   GFR calc non Af Amer 25 (L) >60 mL/min   GFR calc Af Amer 28 (L) >60 mL/min    Comment: (NOTE) The eGFR has been calculated using the CKD EPI equation. This calculation has not been validated in all clinical situations. eGFR's persistently <60 mL/min signify possible Chronic Kidney Disease.    Anion gap 6 5 - 15   US  Renal  05/08/2015  CLINICAL DATA:  Acute renal failure.  Prior right nephrectomy EXAM: RENAL /  URINARY TRACT ULTRASOUND COMPLETE COMPARISON:  CT 11/06/2012 FINDINGS: Right Kidney: Length: Prior right nephrectomy. Left Kidney: Length: 11.8 cm. Calcification in the midpole measures 12 mm. This is located peripherally and could be parenchymal calcification or within the collecting system in an area of scarring and overlying cortical thinning. No hydronephrosis. Bladder: Decompressed with Foley catheter in place. IMPRESSION: Prior right nephrectomy. No hydronephrosis on the left. Parenchymal calcification versus nonobstructing 12 mm midpole stone on the left as described above. Electronically Signed   By: Rolm Baptise M.D.   On: 05/08/2015 10:08   Dg Chest Port 1 View  05/08/2015  CLINICAL DATA:  Status post central line placement. EXAM: PORTABLE CHEST 1 VIEW COMPARISON:  May 07, 2015. FINDINGS: The heart size and mediastinal contours are within normal limits. No pneumothorax or pleural effusion is noted. Increased central pulmonary vascular congestion is noted with probable bilateral perihilar edema. Stable right basilar opacity is noted concerning for pneumonia or atelectasis. Right internal jugular catheter is unchanged in position with distal tip in the expected position the cavoatrial junction. There is been interval placement of right-sided PICC line with distal tip in the right atrium. The visualized skeletal structures are unremarkable. IMPRESSION: Increased central pulmonary vascular congestion is noted with probable bilateral perihilar edema. Stable right basilar opacity is noted concerning for pneumonia or subsegmental atelectasis. Interval placement of right-sided PICC line with distal tip in the expected position of the right atrium. Withdrawal by 3-4 cm is recommended. These results will be called to the ordering clinician or representative by the Radiologist Assistant, and communication documented  in the PACS or zVision Dashboard. Electronically Signed   By: Marijo Conception, M.D.   On: 05/08/2015 15:01    Assessment/Plan:  Long-standing rectal prolapse is not wish to have surgery for this no sign of prolapse at this time but intermittent prolapse would just be treated by a reduction. No surgical plans at this time. I had asked the ICU nurse earlier today by phone to order a hydrocortisone cream which she has done.  Florene Glen, MD, FACS

## 2015-05-09 NOTE — Progress Notes (Signed)
[place] [facility] [place] NAME: Alan Small    MR#:  [id number]  DATE OF BIRTH:  [birth year]  SUBJECTIVE:  CHIEF COMPLAINT:   Chief Complaint  Patient presents with  . Abdominal Pain   - patient doing much better today  REVIEW OF SYSTEMS:  Review of Systems  Constitutional: Negative for fever and chills.  Respiratory: Negative for cough, shortness of breath and wheezing.   Cardiovascular: Negative for chest pain and palpitations.  Gastrointestinal: Negative for nausea, vomiting, abdominal pain, diarrhea and constipation.  Genitourinary: Negative for dysuria.       Improved Bladder spasms  Neurological: Negative for dizziness, seizures and headaches.    DRUG ALLERGIES:   Allergies  Allergen Reactions  . Mellaril [Thioridazine] Other (See Comments)    States sinuses stops/ swell up   . Morphine And Related Itching  . Navane [Thiothixene] Other (See Comments)    GI Distress  . Thorazine [Chlorpromazine] Other (See Comments)    Sensitive to sunlight. Sunburns easily    VITALS:  Blood pressure 142/100, pulse 105, temperature 97.4 F (36.3 C), temperature source Oral, resp. rate 33, height 6' (1.829 m), weight 74.8 kg (164 lb 14.5 oz), SpO2 96 %.  PHYSICAL EXAMINATION:  Physical Exam  GENERAL:  59 y.o.-year-old patient lying in the bed with no acute distress. disshelved. EYES: Pupils equal, round, reactive to light and accommodation. No scleral icterus. Extraocular muscles intact.  HEENT: Head atraumatic, normocephalic. Oropharynx and nasopharynx clear.  NECK:  Supple, no jugular venous distention. No thyroid enlargement, no tenderness.  LUNGS: Normal breath sounds bilaterally, no wheezing, rales,rhonchi or crepitation. No use of accessory muscles of respiration.  CARDIOVASCULAR: S1, S2 normal. No murmurs, rubs, or gallops.  ABDOMEN: Soft, nontender, nondistended. Bowel sounds present. No organomegaly or mass. Some suprapubic  discomfort on palpation noted. Has a chronic indwelling Foley catheter. Catheter last changed on 05/07/15- some minimal bleeding around urethral orifice noted EXTREMITIES: No pedal edema, cyanosis, or clubbing.  NEUROLOGIC: Cranial nerves II through XII are intact. Muscle strength 5/5 in all extremities. Sensation intact. Gait not checked.  PSYCHIATRIC: The patient is alert and oriented x 3.  SKIN: No obvious rash, lesion, or ulcer.    LABORATORY PANEL:   CBC  Recent Labs Lab 05/09/15 0420  WBC 35.2*  HGB 11.0*  HCT 32.7*  PLT 90*   ------------------------------------------------------------------------------------------------------------------  Chemistries   Recent Labs Lab 05/06/15 1057  05/09/15 0420  NA 142  < > 138  K 4.5  < > 4.1  CL 109  < > 112*  CO2 18*  < > 20*  GLUCOSE 112*  < > 117*  BUN 34*  < > 46*  CREATININE 2.54*  < > 2.65*  CALCIUM 9.7  < > 8.1*  AST 26  --   --   ALT 20  --   --   ALKPHOS 106  --   --   BILITOT 0.4  --   --   < > = values in this interval not displayed. ------------------------------------------------------------------------------------------------------------------  Cardiac Enzymes No results for input(s): TROPONINI in the last 168 hours. ------------------------------------------------------------------------------------------------------------------  RADIOLOGY:  US Renal  05/08/2015  CLINICAL DATA:  Acute renal failure.  Prior right nephrectomy EXAM: RENAL / URINARY TRACT ULTRASOUND COMPLETE COMPARISON:  CT 11/06/2012 FINDINGS: Right Kidney: Length: Prior right nephrectomy. Left Kidney: Length: 11.8 cm. Calcification in the midpole measures 12 mm. This is located peripherally and could be parenchymal calcification or within the  collecting system in an area of scarring and overlying cortical thinning. No hydronephrosis. Bladder: Decompressed with Foley catheter in place. IMPRESSION: Prior right nephrectomy. No hydronephrosis on  the left. Parenchymal calcification versus nonobstructing 12 mm midpole stone on the left as described above. Electronically Signed   By: Rolm Baptise M.D.   On: 05/08/2015 10:08   Dg Chest Port 1 View  05/08/2015  CLINICAL DATA:  Status post central line placement. EXAM: PORTABLE CHEST 1 VIEW COMPARISON:  May 07, 2015. FINDINGS: The heart size and mediastinal contours are within normal limits. No pneumothorax or pleural effusion is noted. Increased central pulmonary vascular congestion is noted with probable bilateral perihilar edema. Stable right basilar opacity is noted concerning for pneumonia or atelectasis. Right internal jugular catheter is unchanged in position with distal tip in the expected position the cavoatrial junction. There is been interval placement of right-sided PICC line with distal tip in the right atrium. The visualized skeletal structures are unremarkable. IMPRESSION: Increased central pulmonary vascular congestion is noted with probable bilateral perihilar edema. Stable right basilar opacity is noted concerning for pneumonia or subsegmental atelectasis. Interval placement of right-sided PICC line with distal tip in the expected position of the right atrium. Withdrawal by 3-4 cm is recommended. These results will be called to the ordering clinician or representative by the Radiologist Assistant, and communication documented in the PACS or zVision Dashboard. Electronically Signed   By: Marijo Conception, M.D.   On: 05/08/2015 15:01    EKG:   Orders placed or performed during the hospital encounter of 02/22/15  . EKG    ASSESSMENT AND PLAN:   59 year old male with past medical history of paranoid schizophrenia, hypothyroidism, history of chronic indwelling Foley catheter, history of multidrug resistant UTI, depression who presents to the hospital due to abdominal pain and noted to have sepsis secondary to UTI.   #1 sepsis-the cultures growing ESBL Escherichia coli, urine  cultures growing pseudomonas -- decrease IV fluids rate off Levophed now. -has PICC line and on IV meropenem -ID consult appreciated  #2 urinary tract infection-patient is high risk given his chronic indwelling Foley. - cultures growing pseudomonas -On IV meropenem. -Significant bladder spasms-so on belladonna opioids suppositories  - will discontinue foley and encourage him to do intermittent caths as outpatient - outpatient urology f/u recommended   #3 Acute renal failure on CKD-follows with nephrology at [place] GFR seems to be around 30, stage III CKD, baseline creatinine around 2.1. Creatinine and GFR Improving now - diuresed almost 10 liters in 24hrs- post ATN diuresis vs DI - monitor urine output. Appreciate nephrology consult -Avoid nephrotoxins.  - Patient is status post right nephrectomy for a nonfunctioning kidney.   #4 COPD-no acute exacerbation. -Continue home inhalers.  #5 paranoid schizophrenia-continue risperidone  -On Prozac for depression    #6 DVT prophylaxis-on subcutaneous heparin   #7 Rectal prolapse- chronic, reducible - surgical consult requested  Transfer out of ICU.  Uses a cane at baseline. Discharge with home health  All the records are reviewed and case discussed with Care Management/Social Workerr. Management plans discussed with the patient, family and they are in agreement.  CODE STATUS: Full Code  TOTAL  TIME SPENT IN TAKING CARE OF THIS PATIENT: 36 minutes.   POSSIBLE D/C IN 1-2 DAYS, DEPENDING ON CLINICAL CONDITION.   Gladstone Lighter M.D on 05/09/2015 at 2:46 PM  Between 7am to 6pm - Pager - [phone]  After 6pm go to [email or web address] - password [email or web address]  [facility]  Office  [phone]  CC: Primary care physician; [name], MD

## 2015-05-09 NOTE — Progress Notes (Signed)
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   Objective:  Vital signs in last 24 hours:  Temp:  [95.7 F (35.4 C)-98.6 F (37 C)] 97.4 F (36.3 C) (12/07 0730) Pulse Rate:  [56-108] 89 (12/07 0700) Resp:  [18-32] 21 (12/07 0700) BP: (106-143)/(67-101) 131/92 mmHg (12/07 0730) SpO2:  [93 %-98 %] 95 % (12/07 0700)  Weight change:  Filed Weights   05/06/15 1054 05/06/15 1853 05/07/15 0700  Weight: 70.761 kg (156 lb) 71.668 kg (158 lb) 74.8 kg (164 lb 14.5 oz)    Intake/Output: I/O last 3 completed shifts: In: 9663.2 [P.O.:6500; I.V.:3013.2; IV Piggyback:150] Out: 26203 [Urine:14450]   Intake/Output this shift:     Physical Exam: General: NAD, sitting up in bed.   Head: +temporal wasting. Moist oral mucosal membranes  Eyes: Anicteric, PERRL  Neck: Supple, trachea midline  Lungs:  Clear to auscultation  Heart: Regular rate and rhythm  Abdomen:  Soft, nontender,   Extremities:  no peripheral edema.  Neurologic: Nonfocal, moving all four extremities  Skin: No lesions       Basic Metabolic Panel:  Recent Labs Lab 05/06/15 1057 05/07/15 0420 05/08/15 0445 05/09/15 0420  NA 142 135 130* 138  K 4.5 4.2 4.3 4.1  CL 109 111 107 112*  CO2 18* 17* 16* 20*  GLUCOSE 112* 105* 83 117*  BUN 34* 50* 54* 46*  CREATININE 2.54* 4.50* 3.86* 2.65*  CALCIUM 9.7 8.0* 7.3* 8.1*    Liver Function Tests:  Recent Labs Lab 05/06/15 1057  AST 26  ALT 20  ALKPHOS 106  BILITOT 0.4  PROT 7.2  ALBUMIN 3.8    Recent Labs Lab 05/06/15 1057  LIPASE 30   No results for input(s): AMMONIA in the last 168 hours.  CBC:  Recent Labs Lab 05/06/15 1057 05/07/15 0420 05/08/15 0445 05/09/15 0420  WBC 26.3* 40.6* 45.5* 35.2*  HGB 14.1 11.8* 10.5* 11.0*  HCT 43.6 35.6* 32.4* 32.7*  MCV 91.7 92.7 91.7 90.8  PLT 223 118* 97* 90*    Cardiac Enzymes: No results for input(s):  CKTOTAL, CKMB, CKMBINDEX, TROPONINI in the last 168 hours.  BNP: Invalid input(s): POCBNP  CBG: No results for input(s): GLUCAP in the last 168 hours.  Microbiology: Results for orders placed or performed during the hospital encounter of 05/06/15  Urine culture     Status: None (Preliminary result)   Collection Time: 05/06/15 11:12 AM  Result Value Ref Range Status   Specimen Description URINE, RANDOM  Final   Special Requests NONE  Final   Culture   Final    >=100,000 COLONIES/mL GRAM NEGATIVE RODS IDENTIFICATION AND SUSCEPTIBILITIES TO FOLLOW    Report Status PENDING  Incomplete  Culture, blood (routine x 2)     Status: None   Collection Time: 05/06/15  1:08 PM  Result Value Ref Range Status   Specimen Description BLOOD RIGHT AC  Final   Special Requests BOTTLES DRAWN AEROBIC AND ANAEROBIC  3CC  Final   Culture  Setup Time   Final    GRAM NEGATIVE RODS IN BOTH AEROBIC AND ANAEROBIC BOTTLES CRITICAL RESULT CALLED TO, READ BACK BY AND VERIFIED WITH: CHAROLOTTE KYEI AT 2342 05/06/15.PMH CONFIRMED BY WM    Culture   Final    ESCHERICHIA COLI IN BOTH AEROBIC AND ANAEROBIC BOTTLES Results Called to: [facility] TRAVIS AT 0740 05/08/15 DV ESBL-EXTENDED SPECTRUM BETA LACTAMASE-THE ORGANISM IS RESISTANT TO  PENICILLINS, CEPHALOSPORINS AND AZTREONAM ACCORDING TO CLSI M100-S15 VOL.[place].    Report Status 05/08/2015 FINAL  Final   Organism ID, Bacteria ESCHERICHIA COLI  Final      Susceptibility   Escherichia coli - MIC*    AMPICILLIN >=32 RESISTANT Resistant     CEFTAZIDIME 16 RESISTANT Resistant     CEFAZOLIN >=64 RESISTANT Resistant     CEFTRIAXONE >=64 RESISTANT Resistant     CIPROFLOXACIN >=4 RESISTANT Resistant     GENTAMICIN <=1 SENSITIVE Sensitive     IMIPENEM <=0.25 SENSITIVE Sensitive     TRIMETH/SULFA >=320 RESISTANT Resistant     Extended ESBL POSITIVE Resistant     PIP/TAZO Value in next row Intermediate      INTERMEDIATE64    * ESCHERICHIA COLI  Culture,  blood (routine x 2)     Status: None   Collection Time: 05/06/15  1:13 PM  Result Value Ref Range Status   Specimen Description BLOOD LEFT AC  Final   Special Requests BOTTLES DRAWN AEROBIC AND ANAEROBIC  1CC  Final   Culture  Setup Time   Final    GRAM NEGATIVE RODS IN BOTH AEROBIC AND ANAEROBIC BOTTLES CRITICAL RESULT CALLED TO, READ BACK BY AND VERIFIED WITH: CHARLOTTE KYEI AT 2146 05/06/15.PMH CONFIRMED BY CAF    Culture   Final    ESCHERICHIA COLI IN BOTH AEROBIC AND ANAEROBIC BOTTLES Results Called to: [facility] TRAVIS AT 0740 05/08/15 DV ESBL-EXTENDED SPECTRUM BETA LACTAMASE-THE ORGANISM IS RESISTANT TO PENICILLINS, CEPHALOSPORINS AND AZTREONAM ACCORDING TO CLSI M100-S15 VOL.[place].    Report Status 05/08/2015 FINAL  Final   Organism ID, Bacteria ESCHERICHIA COLI  Final      Susceptibility   Escherichia coli - MIC*    AMPICILLIN >=32 RESISTANT Resistant     CEFTAZIDIME 16 RESISTANT Resistant     CEFAZOLIN >=64 RESISTANT Resistant     CEFTRIAXONE >=64 RESISTANT Resistant     CIPROFLOXACIN >=4 RESISTANT Resistant     GENTAMICIN <=1 SENSITIVE Sensitive     IMIPENEM <=0.25 SENSITIVE Sensitive     TRIMETH/SULFA >=320 RESISTANT Resistant     Extended ESBL POSITIVE Resistant     PIP/TAZO Value in next row Intermediate      INTERMEDIATE64    * ESCHERICHIA COLI  MRSA PCR Screening     Status: None   Collection Time: 05/07/15  7:14 AM  Result Value Ref Range Status   MRSA by PCR NEGATIVE NEGATIVE Final    Comment:        The GeneXpert MRSA Assay (FDA approved for NASAL specimens only), is one component of a comprehensive MRSA colonization surveillance program. It is not intended to diagnose MRSA infection nor to guide or monitor treatment for MRSA infections.     Coagulation Studies: No results for input(s): LABPROT, INR in the last 72 hours.  Urinalysis:  Recent Labs  05/06/15 1112  COLORURINE YELLOW*  LABSPEC 1.009  PHURINE 9.0*  GLUCOSEU NEGATIVE   HGBUR 3+*  BILIRUBINUR NEGATIVE  KETONESUR NEGATIVE  PROTEINUR 100*  NITRITE POSITIVE*  LEUKOCYTESUR 3+*      Imaging: US Renal  05/08/2015  CLINICAL DATA:  Acute renal failure.  Prior right nephrectomy EXAM: RENAL / URINARY TRACT ULTRASOUND COMPLETE COMPARISON:  CT 11/06/2012 FINDINGS: Right Kidney: Length: Prior right nephrectomy. Left Kidney: Length: 11.8 cm. Calcification in the midpole measures 12 mm. This is located peripherally and could be parenchymal calcification or within the collecting system in an area of scarring and  overlying cortical thinning. No hydronephrosis. Bladder: Decompressed with Foley catheter in place. IMPRESSION: Prior right nephrectomy. No hydronephrosis on the left. Parenchymal calcification versus nonobstructing 12 mm midpole stone on the left as described above. Electronically Signed   By: Rolm Baptise M.D.   On: 05/08/2015 10:08   Dg Chest Port 1 View  05/08/2015  CLINICAL DATA:  Status post central line placement. EXAM: PORTABLE CHEST 1 VIEW COMPARISON:  May 07, 2015. FINDINGS: The heart size and mediastinal contours are within normal limits. No pneumothorax or pleural effusion is noted. Increased central pulmonary vascular congestion is noted with probable bilateral perihilar edema. Stable right basilar opacity is noted concerning for pneumonia or atelectasis. Right internal jugular catheter is unchanged in position with distal tip in the expected position the cavoatrial junction. There is been interval placement of right-sided PICC line with distal tip in the right atrium. The visualized skeletal structures are unremarkable. IMPRESSION: Increased central pulmonary vascular congestion is noted with probable bilateral perihilar edema. Stable right basilar opacity is noted concerning for pneumonia or subsegmental atelectasis. Interval placement of right-sided PICC line with distal tip in the expected position of the right atrium. Withdrawal by 3-4 cm is  recommended. These results will be called to the ordering clinician or representative by the Radiologist Assistant, and communication documented in the PACS or zVision Dashboard. Electronically Signed   By: Marijo Conception, M.D.   On: 05/08/2015 15:01   Dg Chest Port 1 View  05/07/2015  CLINICAL DATA:  Central catheter placement EXAM: PORTABLE CHEST 1 VIEW COMPARISON:  January 17, 2015 FINDINGS: Central catheter tip is at the cavoatrial junction. No pneumothorax. There is a small area of infiltrate in the right lower lobe. Lungs elsewhere clear. Heart size and pulmonary vascularity are normal. No adenopathy. No bone lesions. IMPRESSION: Central catheter tip at cavoatrial junction. No pneumothorax. Small area of of infiltrate right base. Electronically Signed   By: Lowella Grip III M.D.   On: 05/07/2015 10:13     Medications:   .  sodium bicarbonate  infusion 1000 mL 50 mL/hr at 05/09/15 0630   . darifenacin  7.5 mg Oral Daily  . feeding supplement (ENSURE ENLIVE)  237 mL Oral BID WC  . FLUoxetine  40 mg Oral Daily  . heparin subcutaneous  5,000 Units Subcutaneous 3 times per day  . levothyroxine  50 mcg Oral QAC breakfast  . meropenem (MERREM) IV  500 mg Intravenous Q12H  . mometasone-formoterol  2 puff Inhalation BID  . risperiDONE  3 mg Oral QHS  . sodium chloride  10-40 mL Intracatheter Q12H  . tamsulosin  0.4 mg Oral Daily   acetaminophen **OR** acetaminophen, HYDROmorphone (DILAUDID) injection, ondansetron **OR** ondansetron (ZOFRAN) IV, opium-belladonna, sodium chloride, sodium chloride  Assessment/ Plan:  Alan Small is a 59 y.o. white male with schizophrenia, seizure disorder, CVA, coronary artery disease, COPD, peptic ulcer disease, hypothyroidism, right nephrectomy, nephrolithiasis and neurogenic bladder.   1. Acute renal failure on chronic kidney disease stage III: baseline creatinine of 1.95, eGFR of 35. Acute renal failure from sepsis, urinary tract infection and  possibly obstructive uropathy.  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  With polyuria: unclear if patient is having post ATN/post obstructive diuresis versus having an underlying diabetes insipidus or psychogenic polydipsia. If no improvement, will do urine and serum osm.  Serum electrolytes are stable. No acute indication for dialysis at this time. Creatinine continues to improve.  Continue monitoring renal  function, urine output, and serum electrolytes.   2. 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  Improving, CO2 20 - continue bicarb gtt at 56ml/hr for another 24 hours.   3. Hyponatremia: improved with IV sodium bicarbonate  4. Sepsis secondary to urinary tract infection: renally dosed meropenem. E. Coli on urine culture. Now off vasopressors.   5. Polyuria is urine output greater than 3 litres. Glucose levels are at goal    LOS: [place], [place] 12/7/20168:24 AM

## 2015-05-09 NOTE — Progress Notes (Signed)
ANTIBIOTIC CONSULT NOTE - INITIAL  Pharmacy Consult for Meropenem Indication: sepsis  Allergies  Allergen Reactions  . Mellaril [Thioridazine] Other (See Comments)    States sinuses stops/ swell up   . Morphine And Related Itching  . Navane [Thiothixene] Other (See Comments)    GI Distress  . Thorazine [Chlorpromazine] Other (See Comments)    Sensitive to sunlight. Sunburns easily    Patient Measurements: Height: 6' (182.9 cm) Weight: 164 lb 14.5 oz (74.8 kg) IBW/kg (Calculated) : 77.6 Adjusted Body Weight:   Vital Signs: Temp: 97.4 F (36.3 C) (12/07 0730) Temp Source: Oral (12/07 0730) BP: 139/99 mmHg (12/07 1200) Pulse Rate: 66 (12/07 1200) Intake/Output from previous day: 12/06 0701 - 12/07 0700 In: 6659.2 [P.O.:5200; I.V.:1359.2; IV Piggyback:100] Out: 10750 [id number] Intake/Output from this shift: Total I/O In: 640 [P.O.:480; I.V.:160] Out: 2300 [Urine:2300]  Labs:  Recent Labs  05/07/15 0420 05/08/15 0445 05/09/15 0420  WBC 40.6* 45.5* 35.2*  HGB 11.8* 10.5* 11.0*  PLT 118* 97* 90*  CREATININE 4.50* 3.86* 2.65*   Estimated Creatinine Clearance: 31.4 mL/min (by C-G formula based on Cr of 2.65). No results for input(s): VANCOTROUGH, VANCOPEAK, VANCORANDOM, GENTTROUGH, GENTPEAK, GENTRANDOM, TOBRATROUGH, TOBRAPEAK, TOBRARND, AMIKACINPEAK, AMIKACINTROU, AMIKACIN in the last 72 hours.   Microbiology: Recent Results (from the past 720 hour(s))  Urine culture     Status: None (Preliminary result)   Collection Time: 05/06/15 11:12 AM  Result Value Ref Range Status   Specimen Description URINE, RANDOM  Final   Special Requests NONE  Final   Culture   Final    >=100,000 COLONIES/mL GRAM NEGATIVE RODS >=100,000 COLONIES/mL PSEUDOMONAS AERUGINOSA IDENTIFICATION AND SUSCEPTIBILITIES TO FOLLOW    Report Status PENDING  Incomplete   Organism ID, Bacteria PSEUDOMONAS AERUGINOSA  Final      Susceptibility   Pseudomonas aeruginosa - MIC*    CIPROFLOXACIN  Value in next row Intermediate      INTERMEDIATE2    GENTAMICIN Value in next row Intermediate      INTERMEDIATE8    IMIPENEM Value in next row Sensitive      SENSITIVE2    PIP/TAZO Value in next row Sensitive      SENSITIVE32    AMPICILLIN Value in next row Resistant      RESISTANT>=32    CEFAZOLIN Value in next row Resistant      RESISTANT>=64    CEFTRIAXONE Value in next row Resistant      RESISTANT>=64    LEVOFLOXACIN Value in next row Resistant      RESISTANT>=8    NITROFURANTOIN Value in next row Resistant      RESISTANT>=512    TRIMETH/SULFA Value in next row Resistant      RESISTANT>=320    * >=100,000 COLONIES/mL PSEUDOMONAS AERUGINOSA  Culture, blood (routine x 2)     Status: None   Collection Time: 05/06/15  1:08 PM  Result Value Ref Range Status   Specimen Description BLOOD RIGHT AC  Final   Special Requests BOTTLES DRAWN AEROBIC AND ANAEROBIC  3CC  Final   Culture  Setup Time   Final    GRAM NEGATIVE RODS IN BOTH AEROBIC AND ANAEROBIC BOTTLES CRITICAL RESULT CALLED TO, READ BACK BY AND VERIFIED WITH: CHAROLOTTE KYEI AT 2342 05/06/15.PMH CONFIRMED BY WM    Culture   Final    ESCHERICHIA COLI IN BOTH AEROBIC AND ANAEROBIC BOTTLES Results Called to: [facility] TRAVIS AT 0740 05/08/15 DV ESBL-EXTENDED SPECTRUM BETA LACTAMASE-THE ORGANISM IS RESISTANT TO PENICILLINS, CEPHALOSPORINS AND AZTREONAM  ACCORDING TO CLSI M100-S15 VOL.[place].    Report Status 05/08/2015 FINAL  Final   Organism ID, Bacteria ESCHERICHIA COLI  Final      Susceptibility   Escherichia coli - MIC*    AMPICILLIN >=32 RESISTANT Resistant     CEFTAZIDIME 16 RESISTANT Resistant     CEFAZOLIN >=64 RESISTANT Resistant     CEFTRIAXONE >=64 RESISTANT Resistant     CIPROFLOXACIN >=4 RESISTANT Resistant     GENTAMICIN <=1 SENSITIVE Sensitive     IMIPENEM <=0.25 SENSITIVE Sensitive     TRIMETH/SULFA >=320 RESISTANT Resistant     Extended ESBL POSITIVE Resistant     PIP/TAZO Value in next row  Intermediate      INTERMEDIATE64    * ESCHERICHIA COLI  Culture, blood (routine x 2)     Status: None   Collection Time: 05/06/15  1:13 PM  Result Value Ref Range Status   Specimen Description BLOOD LEFT AC  Final   Special Requests BOTTLES DRAWN AEROBIC AND ANAEROBIC  1CC  Final   Culture  Setup Time   Final    GRAM NEGATIVE RODS IN BOTH AEROBIC AND ANAEROBIC BOTTLES CRITICAL RESULT CALLED TO, READ BACK BY AND VERIFIED WITH: CHARLOTTE KYEI AT 2146 05/06/15.PMH CONFIRMED BY CAF    Culture   Final    ESCHERICHIA COLI IN BOTH AEROBIC AND ANAEROBIC BOTTLES Results Called to: [facility] TRAVIS AT 0740 05/08/15 DV ESBL-EXTENDED SPECTRUM BETA LACTAMASE-THE ORGANISM IS RESISTANT TO PENICILLINS, CEPHALOSPORINS AND AZTREONAM ACCORDING TO CLSI M100-S15 VOL.[place].    Report Status 05/08/2015 FINAL  Final   Organism ID, Bacteria ESCHERICHIA COLI  Final      Susceptibility   Escherichia coli - MIC*    AMPICILLIN >=32 RESISTANT Resistant     CEFTAZIDIME 16 RESISTANT Resistant     CEFAZOLIN >=64 RESISTANT Resistant     CEFTRIAXONE >=64 RESISTANT Resistant     CIPROFLOXACIN >=4 RESISTANT Resistant     GENTAMICIN <=1 SENSITIVE Sensitive     IMIPENEM <=0.25 SENSITIVE Sensitive     TRIMETH/SULFA >=320 RESISTANT Resistant     Extended ESBL POSITIVE Resistant     PIP/TAZO Value in next row Intermediate      INTERMEDIATE64    * ESCHERICHIA COLI  MRSA PCR Screening     Status: None   Collection Time: 05/07/15  7:14 AM  Result Value Ref Range Status   MRSA by PCR NEGATIVE NEGATIVE Final    Comment:        The GeneXpert MRSA Assay (FDA approved for NASAL specimens only), is one component of a comprehensive MRSA colonization surveillance program. It is not intended to diagnose MRSA infection nor to guide or monitor treatment for MRSA infections.     Medical History: Past Medical History  Diagnosis Date  . Mental disorder   . Seizures ([place])   . Myocardial infarction ([place])   .  Stroke ([place])   . Asthma   . Paranoid schizophrenia ([place])   . Gastric ulcer   . External hemorrhoids   . Hypothyroidism     hypo  . Insomnia   . Depression   . Renal disorder     renal failure  . Hydronephrosis     Medications:  Prescriptions prior to admission  Medication Sig Dispense Refill Last Dose  . albuterol-ipratropium (COMBIVENT) 18-103 MCG/ACT inhaler Inhale 2 puffs into the lungs every 6 (six) hours as needed for wheezing.   prn at prn  . diphenhydrAMINE (BENADRYL) 50  MG capsule Take 50 mg by mouth at bedtime.   prn at prn  . FLUoxetine (PROZAC) 40 MG capsule Take 40 mg by mouth daily.   05/05/2015 at 0800  . Fluticasone-Salmeterol (ADVAIR) 250-50 MCG/DOSE AEPB Inhale 1 puff into the lungs 2 (two) times daily.   05/05/2015 at 0800  . levothyroxine (SYNTHROID, LEVOTHROID) 50 MCG tablet Take 50 mcg by mouth daily before breakfast.   05/05/2015 at 0700  . risperiDONE (RISPERDAL) 0.5 MG tablet Take 0.5 mg by mouth at bedtime.   05/05/2015 at 2000  . risperiDONE (RISPERDAL) 3 MG tablet Take 3 mg by mouth at bedtime.   05/05/2015 at 2000  . sodium bicarbonate 650 MG tablet Take 2 tablets (1,300 mg total) by mouth 2 (two) times daily. (Patient taking differently: Take 650 mg by mouth 2 (two) times daily. ) 20 tablet  05/06/2015 at 0800  . solifenacin (VESICARE) 5 MG tablet Take 5 mg by mouth daily.   05/05/2015 at 0800  . sulfamethoxazole-trimethoprim (BACTRIM,SEPTRA) 400-80 MG tablet Take 1 tablet by mouth 2 (two) times daily.   05/06/2015 at 0800  . tamsulosin (FLOMAX) 0.4 MG CAPS capsule Take 0.4 mg by mouth daily.   05/05/2015 at 2000   Assessment: 59 y/o M with sepsis from ESBL E coli. Patient is on day 4 of meropenem.   Plan:  Continue meropenem 500 mg iv q 12 hours. Will continue to follow renal function.   Ulice Dash D 05/09/2015,12:28 PM

## 2015-05-09 NOTE — Progress Notes (Signed)
[place] INFECTIOUS DISEASE PROGRESS NOTE Date of Admission:  05/06/2015     ID: Alan Small is a 59 y.o. male with ESBL E coli urosepsis Active Problems:   Sepsis ([place])   Subjective: Off pressors, HD stable. Transferring to floor. Had rectal prolapse today. Urine clear. No diarrhea or abd pain.   ROS  Eleven systems are reviewed and negative except per hpi  Medications:  Antibiotics Given (last 72 hours)    Date/Time Action Medication Dose Rate   05/06/15 2119 Given   ertapenem (INVANZ) 1 g in sodium chloride 0.9 % 50 mL IVPB 1 g 100 mL/hr   05/07/15 2100 Given   meropenem (MERREM) 500 mg in sodium chloride 0.9 % 50 mL IVPB 500 mg 100 mL/hr   05/08/15 1059 Given   meropenem (MERREM) 500 mg in sodium chloride 0.9 % 50 mL IVPB 500 mg 100 mL/hr   05/08/15 2217 Given   meropenem (MERREM) 500 mg in sodium chloride 0.9 % 50 mL IVPB 500 mg 100 mL/hr   05/09/15 1016 Given   meropenem (MERREM) 500 mg in sodium chloride 0.9 % 50 mL IVPB 500 mg 100 mL/hr     . darifenacin  7.5 mg Oral Daily  . feeding supplement (ENSURE ENLIVE)  237 mL Oral BID WC  . FLUoxetine  40 mg Oral Daily  . hydrocortisone   Rectal BID  . levothyroxine  50 mcg Oral QAC breakfast  . meropenem (MERREM) IV  500 mg Intravenous Q12H  . mometasone-formoterol  2 puff Inhalation BID  . risperiDONE  3 mg Oral QHS  . sodium chloride  10-40 mL Intracatheter Q12H  . tamsulosin  0.4 mg Oral Daily    Objective: Vital signs in last 24 hours: Temp:  [97.4 F (36.3 C)-98.6 F (37 C)] 97.4 F (36.3 C) (12/07 0730) Pulse Rate:  [66-105] 105 (12/07 1300) Resp:  [16-33] 33 (12/07 1300) BP: (123-143)/(81-101) 142/100 mmHg (12/07 1300) SpO2:  [93 %-97 %] 96 % (12/07 1300) GENERAL: 59 y.o.-year-old patient lying in the bed with no acute distress.EYES: Pupils equal, round, reactive to light and accommodation. No scleral icterus. Extraocular muscles intact.  HEENT: Head atraumatic, normocephalic. Oropharynx and  nasopharynx clear.  NECK: Supple, no jugular venous distention. No thyroid enlargement, no tenderness.  LUNGS: Normal breath sounds bilaterally, no wheezing, rales,rhonchi or crepitation. No use of accessory muscles of respiration.  CARDIOVASCULAR: S1, S2 normal. No murmurs, rubs, or gallops.  ABDOMEN: Soft, nontender, nondistended. Bowel sounds present. No organomegaly or mass. Some suprapubic discomfort on palpation noted. Has a chronic indwelling Foley catheter. EXTREMITIES: No pedal edema, cyanosis, or clubbing.  NEUROLOGIC: Cranial nerves II through XII are intact. Muscle strength 5/5 in all extremities. Sensation intact. Gait not checked.  PSYCHIATRIC: The patient is alert and oriented x 3.  SKIN: No obvious rash, lesion, or ulcer Lab Results  Recent Labs  05/08/15 0445 05/09/15 0420  WBC 45.5* 35.2*  HGB 10.5* 11.0*  HCT 32.4* 32.7*  NA 130* 138  K 4.3 4.1  CL 107 112*  CO2 16* 20*  BUN 54* 46*  CREATININE 3.86* 2.65*    Microbiology: Results for orders placed or performed during the hospital encounter of 05/06/15  Urine culture     Status: None (Preliminary result)   Collection Time: 05/06/15 11:12 AM  Result Value Ref Range Status   Specimen Description URINE, RANDOM  Final   Special Requests NONE  Final   Culture   Final    >=100,000 COLONIES/mL  GRAM NEGATIVE RODS >=100,000 COLONIES/mL PSEUDOMONAS AERUGINOSA IDENTIFICATION AND SUSCEPTIBILITIES TO FOLLOW    Report Status PENDING  Incomplete   Organism ID, Bacteria PSEUDOMONAS AERUGINOSA  Final      Susceptibility   Pseudomonas aeruginosa - MIC*    CIPROFLOXACIN Value in next row Intermediate      INTERMEDIATE2    GENTAMICIN Value in next row Intermediate      INTERMEDIATE8    IMIPENEM Value in next row Sensitive      SENSITIVE2    PIP/TAZO Value in next row Sensitive      SENSITIVE32    AMPICILLIN Value in next row Resistant      RESISTANT>=32    CEFAZOLIN Value in next row Resistant       RESISTANT>=64    CEFTRIAXONE Value in next row Resistant      RESISTANT>=64    LEVOFLOXACIN Value in next row Resistant      RESISTANT>=8    NITROFURANTOIN Value in next row Resistant      RESISTANT>=512    TRIMETH/SULFA Value in next row Resistant      RESISTANT>=320    * >=100,000 COLONIES/mL PSEUDOMONAS AERUGINOSA  Culture, blood (routine x 2)     Status: None   Collection Time: 05/06/15  1:08 PM  Result Value Ref Range Status   Specimen Description BLOOD RIGHT AC  Final   Special Requests BOTTLES DRAWN AEROBIC AND ANAEROBIC  3CC  Final   Culture  Setup Time   Final    GRAM NEGATIVE RODS IN BOTH AEROBIC AND ANAEROBIC BOTTLES CRITICAL RESULT CALLED TO, READ BACK BY AND VERIFIED WITH: CHAROLOTTE KYEI AT 2342 05/06/15.PMH CONFIRMED BY WM    Culture   Final    ESCHERICHIA COLI IN BOTH AEROBIC AND ANAEROBIC BOTTLES Results Called to: [facility] TRAVIS AT 0740 05/08/15 DV ESBL-EXTENDED SPECTRUM BETA LACTAMASE-THE ORGANISM IS RESISTANT TO PENICILLINS, CEPHALOSPORINS AND AZTREONAM ACCORDING TO CLSI M100-S15 VOL.[place].    Report Status 05/08/2015 FINAL  Final   Organism ID, Bacteria ESCHERICHIA COLI  Final      Susceptibility   Escherichia coli - MIC*    AMPICILLIN >=32 RESISTANT Resistant     CEFTAZIDIME 16 RESISTANT Resistant     CEFAZOLIN >=64 RESISTANT Resistant     CEFTRIAXONE >=64 RESISTANT Resistant     CIPROFLOXACIN >=4 RESISTANT Resistant     GENTAMICIN <=1 SENSITIVE Sensitive     IMIPENEM <=0.25 SENSITIVE Sensitive     TRIMETH/SULFA >=320 RESISTANT Resistant     Extended ESBL POSITIVE Resistant     PIP/TAZO Value in next row Intermediate      INTERMEDIATE64    * ESCHERICHIA COLI  Culture, blood (routine x 2)     Status: None   Collection Time: 05/06/15  1:13 PM  Result Value Ref Range Status   Specimen Description BLOOD LEFT AC  Final   Special Requests BOTTLES DRAWN AEROBIC AND ANAEROBIC  1CC  Final   Culture  Setup Time   Final    GRAM NEGATIVE RODS IN  BOTH AEROBIC AND ANAEROBIC BOTTLES CRITICAL RESULT CALLED TO, READ BACK BY AND VERIFIED WITH: CHARLOTTE KYEI AT 2146 05/06/15.PMH CONFIRMED BY [facility]    Culture   Final    ESCHERICHIA COLI IN BOTH AEROBIC AND ANAEROBIC BOTTLES Results Called to: [facility] TRAVIS AT 0740 05/08/15 DV ESBL-EXTENDED SPECTRUM BETA LACTAMASE-THE ORGANISM IS RESISTANT TO PENICILLINS, CEPHALOSPORINS AND AZTREONAM ACCORDING TO CLSI M100-S15 VOL.[place].    Report Status 05/08/2015 FINAL  Final   Organism ID, Bacteria ESCHERICHIA COLI  Final      Susceptibility   Escherichia coli - MIC*    AMPICILLIN >=32 RESISTANT Resistant     CEFTAZIDIME 16 RESISTANT Resistant     CEFAZOLIN >=64 RESISTANT Resistant     CEFTRIAXONE >=64 RESISTANT Resistant     CIPROFLOXACIN >=4 RESISTANT Resistant     GENTAMICIN <=1 SENSITIVE Sensitive     IMIPENEM <=0.25 SENSITIVE Sensitive     TRIMETH/SULFA >=320 RESISTANT Resistant     Extended ESBL POSITIVE Resistant     PIP/TAZO Value in next row Intermediate      INTERMEDIATE64    * ESCHERICHIA COLI  MRSA PCR Screening     Status: None   Collection Time: 05/07/15  7:14 AM  Result Value Ref Range Status   MRSA by PCR NEGATIVE NEGATIVE Final    Comment:        The GeneXpert MRSA Assay (FDA approved for NASAL specimens only), is one component of a comprehensive MRSA colonization surveillance program. It is not intended to diagnose MRSA infection nor to guide or monitor treatment for MRSA infections.     Studies/Results: US Renal  05/08/2015  CLINICAL DATA:  Acute renal failure.  Prior right nephrectomy EXAM: RENAL / URINARY TRACT ULTRASOUND COMPLETE COMPARISON:  CT 11/06/2012 FINDINGS: Right Kidney: Length: Prior right nephrectomy. Left Kidney: Length: 11.8 cm. Calcification in the midpole measures 12 mm. This is located peripherally and could be parenchymal calcification or within the collecting system in an area of scarring and overlying cortical thinning. No  hydronephrosis. Bladder: Decompressed with Foley catheter in place. IMPRESSION: Prior right nephrectomy. No hydronephrosis on the left. Parenchymal calcification versus nonobstructing 12 mm midpole stone on the left as described above. Electronically Signed   By: Rolm Baptise M.D.   On: 05/08/2015 10:08   Dg Chest Port 1 View  05/08/2015  CLINICAL DATA:  Status post central line placement. EXAM: PORTABLE CHEST 1 VIEW COMPARISON:  May 07, 2015. FINDINGS: The heart size and mediastinal contours are within normal limits. No pneumothorax or pleural effusion is noted. Increased central pulmonary vascular congestion is noted with probable bilateral perihilar edema. Stable right basilar opacity is noted concerning for pneumonia or atelectasis. Right internal jugular catheter is unchanged in position with distal tip in the expected position the cavoatrial junction. There is been interval placement of right-sided PICC line with distal tip in the right atrium. The visualized skeletal structures are unremarkable. IMPRESSION: Increased central pulmonary vascular congestion is noted with probable bilateral perihilar edema. Stable right basilar opacity is noted concerning for pneumonia or subsegmental atelectasis. Interval placement of right-sided PICC line with distal tip in the expected position of the right atrium. Withdrawal by 3-4 cm is recommended. These results will be called to the ordering clinician or representative by the Radiologist Assistant, and communication documented in the PACS or zVision Dashboard. Electronically Signed   By: Marijo Conception, M.D.   On: 05/08/2015 15:01    Assessment/Plan: Alan Small is a 59 y.o. 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 He was seen by urology in Nov and placed on bactrim suppression. Admitted with cloudy urine, some possible foley clotting, fevers and leukocytosis. He has ESBL E  coli bacteremia. Has hx MDRO UTI and current cx with GNR. Foley exchanged and urine now clear. HD stable, no fevers but wbc markedly elevated. No diarrhea to suggest C diff. Renal  USS neg for obstruction hydronephrosis or renal abscess Had rectal prolapse today - reduced.  WBC trending down.  Recommendations Cont carbapenem - can change  meropenem back to ertapenem if desired since cx known Unclear why wbc so elevatred as he is improving clinically, has no evidence of abscess and no suggestion of C diff. It is improving so monitor If WBC worsens or develops recurrent fevers  will need CT abd pelvis but hesitant to do with AOCKD.  Strongly consider seeing if pt Lucianne Lei do without the Cath. Perhaps once out of unit remove foley and use Intermittent cath . Can see if this can be done at facility he is at. He has seen urology at [facility] Thank you very much for the consult. Will follow with you.  [place], [place]   05/09/2015, 2:30 PM

## 2015-05-09 NOTE — Plan of Care (Signed)
Problem: Fluid Volume: Goal: Hemodynamic stability will improve Outcome: Progressing Cont on na bicarb drip

## 2015-05-09 NOTE — Evaluation (Signed)
Physical Therapy Evaluation Patient Details Name: Alan Small MRN: [id number] DOB: May 15, 1956 Today's Date: 05/09/2015   History of Present Illness  presented to ER secondary to abdominal pain; admitted with sepsis related to UTI.  Hospital course significant for transfer to CCU due to hypotension (requiring pressors); now off with vitals stable and [facility]  Clinical Impression  Upon evaluation, patient alert and oriented to basic information; follows all commands and demonstrates fair safety awareness/insight.  Very pleasant and cooperative, appreciative of all therapist efforts throughout session.  Bilat UE/LE strength and ROM grossly WFL for basic transfers/mobility.  Able to complete bed mobility with mod indep; sit/stand, basic transfers and very short-distance gait (5') with RW, cga for safety.  Additional mobility deferred at this time (secondary to apparent rectal prolapse note during toileting); RN to place surgical consult.  Will continue additional mobility efforts in subsequent sessions as medically appropriate. Would benefit from skilled PT to address above deficits and promote optimal return to PLOF; Recommend transition to [place] upon discharge from acute hospitalization.     Follow Up Recommendations Home health PT    Equipment Recommendations  Rolling walker with 5" wheels    Recommendations for Other Services       Precautions / Restrictions Precautions Precautions: Fall Precaution Comments: seizure history Restrictions Weight Bearing Restrictions: No      Mobility  Bed Mobility Overal bed mobility: Modified Independent                Transfers Overall transfer level: Needs assistance Equipment used: Rolling walker (2 wheeled) Transfers: Sit to/from Stand Sit to Stand: Min guard         General transfer comment: requires UE support on RW for elevation from all seating surfaces  Ambulation/Gait Ambulation/Gait assistance: Min guard Ambulation  Distance (Feet): 5 Feet Assistive device: Rolling walker (2 wheeled)       General Gait Details: bed/BSC via SPT; fair stability without buckling or LOB, min cuing for walker position/management  Stairs            Wheelchair Mobility    Modified Rankin (Stroke Patients Only)       Balance Overall balance assessment: Needs assistance Sitting-balance support: No upper extremity supported;Feet supported Sitting balance-Leahy Scale: Good     Standing balance support: Bilateral upper extremity supported Standing balance-Leahy Scale: Fair                               Pertinent Vitals/Pain Pain Assessment: No/denies pain    Home Living Family/patient expects to be discharged to:: Group home                 Additional Comments: Resident of group home; single level with both ramp access and stairs (2-3 with single rail) for entry/exit    Prior Function Level of Independence: Independent with assistive device(s)         Comments: Mod indep with SPC for household mobility; does endorse multiple fall history (unable to quantify)     Hand Dominance        Extremity/Trunk Assessment   Upper Extremity Assessment: Overall WFL for tasks assessed           Lower Extremity Assessment: Overall WFL for tasks assessed         Communication   Communication: No difficulties (very pleasant and cooperative)  Cognition Arousal/Alertness: Awake/alert Behavior During Therapy: WFL for tasks assessed/performed Overall Cognitive Status: Within Functional Limits  for tasks assessed                      General Comments      Exercises Other Exercises Other Exercises: Toilet transfer, SPT with RW, cga for safety.  No buckling or LOB.  Sit/stand from [facility] with RW, cga; requires UEs on RW to assist with lift off (esp from lower surfaces).  Noted what appears to be rectal prolapse during hygiene efforts; RN informed/aware and requested patient return  to bed end of session (placing surgery consult for assessment)      Assessment/Plan    PT Assessment Patient needs continued PT services  PT Diagnosis Difficulty walking;Generalized weakness   PT Problem List Decreased activity tolerance;Decreased balance;Decreased mobility;Decreased knowledge of use of DME;Decreased safety awareness;Decreased knowledge of precautions;Cardiopulmonary status limiting activity  PT Treatment Interventions DME instruction;Gait training;Stair training;Functional mobility training;Therapeutic activities;Therapeutic exercise;Balance training;Patient/family education   PT Goals (Current goals can be found in the Care Plan section) Acute Rehab PT Goals Patient Stated Goal: "to return to group home" PT Goal Formulation: With patient Time For Goal Achievement: 05/23/15 Potential to Achieve Goals: Good    Frequency Min 2X/week   Barriers to discharge Decreased caregiver support      Co-evaluation               End of Session Equipment Utilized During Treatment: Gait belt Activity Tolerance: Patient tolerated treatment well Patient left: in bed;with call bell/phone within reach Nurse Communication: Mobility status         Time: 0923-0950 PT Time Calculation (min) (ACUTE ONLY): 27 min   Charges:   PT Evaluation $Initial PT Evaluation Tier I: 1 Procedure PT Treatments $Therapeutic Activity: 8-22 mins   PT G Codes:        Tammy Ericsson H. Owens Shark, PT, DPT, NCS 05/09/2015, 10:20 AM [phone]

## 2015-05-09 NOTE — Plan of Care (Signed)
Problem: Health Behavior/Discharge Planning: Goal: Ability to manage health-related needs will improve Outcome: Progressing calm  Problem: Physical Regulation: Goal: Will remain free from infection Outcome: Progressing Rt upper arm picc for op abx

## 2015-05-09 NOTE — Progress Notes (Signed)
Pt to 125 from ccu  Via bed. Alert. No resp distress. Foley patent and intact.  picc rt upper arm  Intact.  Na bicarb infusing.

## 2015-05-09 NOTE — Progress Notes (Signed)
Nutrition Follow-up   Moderate (non-severe) malnutrition due to social or environmental circustances  INTERVENTION:   Meals and Snacks: Cater to patient preferences Medical Food Supplement Therapy: pt requesting supplement, continue Ensure Enlive po BID, each supplement provides 350 kcal and 20 grams of protein  NUTRITION DIAGNOSIS:   No nutritional diagnosis at this time  GOAL:   Patient will meet greater than or equal to 90% of their needs  MONITOR:    (Energy Intake, Anthropometrics, Digestive System, Electrolyte/Renal Profile)  REASON FOR ASSESSMENT:   Rounds    ASSESSMENT:    Pt admitted with sepsis, UTI with chronic indwelling catheter, 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 Mental disorder   . Seizures ([place])   . Myocardial infarction ([place])   . Stroke ([place])   . Asthma   . Paranoid schizophrenia ([place])   . Gastric ulcer   . External hemorrhoids   . Hypothyroidism     hypo  . Insomnia   . Depression   . Renal disorder     renal failure  . Hydronephrosis    Diet Order:  Diet regular Room service appropriate?: Yes; Fluid consistency:: Thin   Energy Intake: recorded po intake 100% of meals  Food and Nutrition Related History: pt very vague about appetite and intake prior to admission; pt first stated he has not been eating well but then stated this was just for a few days but then reported a longer time frame  Skin:  Reviewed, no issues  Last BM:  12/4  Electrolyte and Renal Profile:  Recent Labs Lab 05/07/15 0420 05/08/15 0445 05/09/15 0420  BUN 50* 54* 46*  CREATININE 4.50* 3.86* 2.65*  NA 135 130* 138  K 4.2 4.3 4.1   Glucose Profile: No results for input(s): GLUCAP in the last 72 hours.   Meds: reviewed  Nutrition Focused Physical Exam: Nutrition-Focused physical exam completed. Findings are mild/moderate fat depletion, mild/moderate muscle depletion, and no edema.    Height:   Ht Readings from Last 1  Encounters:  05/06/15 6' (1.829 m)    Weight: pt reports he weighed 156 pounds 3 months ago  Wt Readings from Last 1 Encounters:  05/07/15 164 lb 14.5 oz (74.8 kg)   BMI:  Body mass index is 22.36 kg/(m^2).  LOW Care Level  Kerman Passey MS, [place], LDN [phone] Pager

## 2015-05-10 LAB — CBC
HCT: 35.1 % — ABNORMAL LOW (ref 40.0–52.0)
Hemoglobin: 11.7 g/dL — ABNORMAL LOW (ref 13.0–18.0)
MCH: 30.3 pg (ref 26.0–34.0)
MCHC: 33.3 g/dL (ref 32.0–36.0)
MCV: 90.9 fL (ref 80.0–100.0)
Platelets: 107 10*3/uL — ABNORMAL LOW (ref 150–440)
RBC: 3.86 MIL/uL — ABNORMAL LOW (ref 4.40–5.90)
RDW: 14.8 % — ABNORMAL HIGH (ref 11.5–14.5)
WBC: 22.1 10*3/uL — ABNORMAL HIGH (ref 3.8–10.6)

## 2015-05-10 LAB — BASIC METABOLIC PANEL
Anion gap: 5 (ref 5–15)
BUN: 32 mg/dL — AB (ref 6–20)
CALCIUM: 8.6 mg/dL — AB (ref 8.9–10.3)
CO2: 27 mmol/L (ref 22–32)
CREATININE: 2.02 mg/dL — AB (ref 0.61–1.24)
Chloride: 111 mmol/L (ref 101–111)
GFR calc Af Amer: 40 mL/min — ABNORMAL LOW (ref >60)
GFR, EST NON AFRICAN AMERICAN: 34 mL/min — AB (ref >60)
GLUCOSE: 97 mg/dL (ref 65–99)
Potassium: 4.5 mmol/L (ref 3.5–5.1)
Sodium: 143 mmol/L (ref 135–145)

## 2015-05-10 LAB — OSMOLALITY, URINE: OSMOLALITY UR: 407 mosm/kg (ref 300–900)

## 2015-05-10 LAB — URINE CULTURE: Culture: >=100000

## 2015-05-10 NOTE — Plan of Care (Signed)
Problem: Education: Goal: Knowledge of [place] [facility] Education information/materials will improve Outcome: Progressing Has received General Education Handout. Reoriented to Oncology unit.   Instructed and Demonstrated how to use phone to call RN and CNA for Assistance. Verbalized understanding.     Problem: Physical Regulation: Goal: Will remain free from infection Remains on IV Antibiotics.

## 2015-05-10 NOTE — Plan of Care (Signed)
Problem: Education: Goal: Knowledge of [place] [facility] Education information/materials will improve Outcome: Progressing Pt is a high fall risk Chronic foley in place, patent.  Pt c/o headache, tylenol given with improvement Pt continues with sodium bicarbonate at 50 ml/hr IV. Pt continues to have rhonchi in bilateral lungs, normal oxygen saturation on room air.

## 2015-05-10 NOTE — Care Management (Addendum)
Per previous RNCM note patient was open to [facility] at his [facility]. I have updated CSW and MD. I have also asked Corene Cornea with [place] to review and that patient has planned discharge today.   Correction: Patient is open to [facility]. Santiago Glad is aware.

## 2015-05-10 NOTE — Progress Notes (Signed)
[place] [facility] [place] NAME: Alan Small    MR#:  [id number]  DATE OF BIRTH:  25-Aug-1955  SUBJECTIVE:  CHIEF COMPLAINT:   Chief Complaint  Patient presents with  . Abdominal Pain   - Continues to have increased urine output. More than 9 L over 24 hours. -Patient otherwise feeling fine. Drinking plenty of fluids. IV fluids have been stopped. No other complaints.  REVIEW OF SYSTEMS:  Review of Systems  Constitutional: Negative for fever and chills.  Respiratory: Negative for cough, shortness of breath and wheezing.   Cardiovascular: Negative for chest pain and palpitations.  Gastrointestinal: Negative for nausea, vomiting, abdominal pain, diarrhea and constipation.  Genitourinary: Negative for dysuria.  Neurological: Negative for dizziness, seizures and headaches.    DRUG ALLERGIES:   Allergies  Allergen Reactions  . Mellaril [Thioridazine] Other (See Comments)    States sinuses stops/ swell up   . Morphine And Related Itching  . Navane [Thiothixene] Other (See Comments)    GI Distress  . Thorazine [Chlorpromazine] Other (See Comments)    Sensitive to sunlight. Sunburns easily    VITALS:  Blood pressure 131/87, pulse 86, temperature 98.7 F (37.1 C), temperature source Oral, resp. rate 20, height 6' (1.829 m), weight 74.8 kg (164 lb 14.5 oz), SpO2 95 %.  PHYSICAL EXAMINATION:  Physical Exam  GENERAL:  59 y.o.-year-old patient lying in the bed with no acute distress. disshelved. EYES: Pupils equal, round, reactive to light and accommodation. No scleral icterus. Extraocular muscles intact.  HEENT: Head atraumatic, normocephalic. Oropharynx and nasopharynx clear.  NECK:  Supple, no jugular venous distention. No thyroid enlargement, no tenderness.  LUNGS: Normal breath sounds bilaterally, no wheezing, rales,rhonchi or crepitation. No use of accessory muscles of respiration.  CARDIOVASCULAR: S1, S2 normal. No murmurs, rubs,  or gallops.  ABDOMEN: Soft, nontender, nondistended. Bowel sounds present. No organomegaly or mass. Some suprapubic discomfort on palpation noted. Has a chronic indwelling Foley catheter. Catheter last changed on 05/07/15-now no bleeding around urethral orifice  EXTREMITIES: No pedal edema, cyanosis, or clubbing.  NEUROLOGIC: Cranial nerves II through XII are intact. Muscle strength 5/5 in all extremities. Sensation intact. Gait not checked.  PSYCHIATRIC: The patient is alert and oriented x 3.  SKIN: No obvious rash, lesion, or ulcer.    LABORATORY PANEL:   CBC  Recent Labs Lab 05/10/15 0530  WBC 22.1*  HGB 11.7*  HCT 35.1*  PLT 107*   ------------------------------------------------------------------------------------------------------------------  Chemistries   Recent Labs Lab 05/06/15 1057  05/10/15 0530  NA 142  < > 143  K 4.5  < > 4.5  CL 109  < > 111  CO2 18*  < > 27  GLUCOSE 112*  < > 97  BUN 34*  < > 32*  CREATININE 2.54*  < > 2.02*  CALCIUM 9.7  < > 8.6*  AST 26  --   --   ALT 20  --   --   ALKPHOS 106  --   --   BILITOT 0.4  --   --   < > = values in this interval not displayed. ------------------------------------------------------------------------------------------------------------------  Cardiac Enzymes No results for input(s): TROPONINI in the last 168 hours. ------------------------------------------------------------------------------------------------------------------  RADIOLOGY:  Dg Chest Port 1 View  05/08/2015  CLINICAL DATA:  Status post central line placement. EXAM: PORTABLE CHEST 1 VIEW COMPARISON:  May 07, 2015. FINDINGS: The heart size and mediastinal contours are within normal limits. No pneumothorax or pleural effusion is  noted. Increased central pulmonary vascular congestion is noted with probable bilateral perihilar edema. Stable right basilar opacity is noted concerning for pneumonia or atelectasis. Right internal jugular catheter  is unchanged in position with distal tip in the expected position the cavoatrial junction. There is been interval placement of right-sided PICC line with distal tip in the right atrium. The visualized skeletal structures are unremarkable. IMPRESSION: Increased central pulmonary vascular congestion is noted with probable bilateral perihilar edema. Stable right basilar opacity is noted concerning for pneumonia or subsegmental atelectasis. Interval placement of right-sided PICC line with distal tip in the expected position of the right atrium. Withdrawal by 3-4 cm is recommended. These results will be called to the ordering clinician or representative by the Radiologist Assistant, and communication documented in the PACS or zVision Dashboard. Electronically Signed   By: Marijo Conception, M.D.   On: 05/08/2015 15:01    EKG:   Orders placed or performed during the hospital encounter of 02/22/15  . EKG    ASSESSMENT AND PLAN:   59 year old male with past medical history of paranoid schizophrenia, hypothyroidism, history of chronic indwelling Foley catheter, history of multidrug resistant UTI, depression who presents to the hospital due to abdominal pain and noted to have sepsis secondary to UTI.   #1 sepsis-the cultures growing ESBL Escherichia coli, urine cultures growing pseudomonas -off IV fluids and off levophed -has PICC line and on IV meropenem -ID consult appreciated  #2 urinary tract infection-patient is high risk given his chronic indwelling Foley due to neurogenic bladder. - cultures growing pseudomonas -On IV meropenem. -Significant bladder spasms-so on belladonna opioids suppositories  - continue foley now with increased urinary output - outpatient urology f/u recommended   #3 Acute renal failure on CKD-follows with nephrology at [facility]. -Baseline GFR seems to be around 30, stage III CKD, baseline creatinine around 2.1. Creatinine and GFR Improving now - continues to diurese -   almost 9 liters in 24hrs- post ATN diuresis vs DI - no use in getting urine lytes per nephrology due to recent ATN -Appreciate nephrology consult -Avoid nephrotoxins.  - Patient is status post right nephrectomy for a nonfunctioning kidney.   #4 COPD-no acute exacerbation. -Continue home inhalers.  #5 paranoid schizophrenia-continue risperidone  -On Prozac for depression    #6 DVT prophylaxis-on subcutaneous heparin   #7 Rectal prolapse- chronic, reducible - surgical consult appreciated - since chronic and reducible and patient doesn't want surgery- no further intervention  Uses a cane at baseline. Discharge with home health once urine output decreases to normal range.  All the records are reviewed and case discussed with Care Management/Social Workerr. Management plans discussed with the patient, family and they are in agreement.  CODE STATUS: Full Code  TOTAL  TIME SPENT IN TAKING CARE OF THIS PATIENT: 36 minutes.   POSSIBLE D/C IN 1-2 DAYS, DEPENDING ON CLINICAL CONDITION.   Gladstone Lighter M.D on 05/10/2015 at 2:19 PM  Between 7am to 6pm - Pager - [phone]  After 6pm go to [email or web address] - password [email or web address] [facility]  Office  [phone]  CC: Primary care physician; [name], MD

## 2015-05-10 NOTE — Progress Notes (Signed)
[facility]  ROUNDING NOTE   Subjective:   Creatinine 2.02 (2.65) (3.86) UOP 9400 (10750)   Moved from ICU  Objective:  Vital signs in last 24 hours:  Temp:  [97.6 F (36.4 C)-98.7 F (37.1 C)] 98.7 F (37.1 C) (12/08 1252) Pulse Rate:  [86-98] 86 (12/08 1252) Resp:  [20-22] 20 (12/08 0505) BP: (131-141)/(87-95) 131/87 mmHg (12/08 1252) SpO2:  [94 %-97 %] 95 % (12/08 1252)  Weight change:  Filed Weights   05/06/15 1054 05/06/15 1853 05/07/15 0700  Weight: 70.761 kg (156 lb) 71.668 kg (158 lb) 74.8 kg (164 lb 14.5 oz)    Intake/Output: I/O last 3 completed shifts: In: 9747 [P.O.:4240; I.V.:960; IV Piggyback:100] Out: 18550 [Urine:14850]   Intake/Output this shift:     Physical Exam: General: NAD, sitting up in bed.   Head: +temporal wasting. Moist oral mucosal membranes  Eyes: Anicteric, PERRL  Neck: Supple, trachea midline  Lungs:  Clear to auscultation  Heart: Regular rate and rhythm  Abdomen:  Soft, nontender,   Extremities:  no peripheral edema.  Neurologic: Nonfocal, moving all four extremities  Skin: No lesions       Basic Metabolic Panel:  Recent Labs Lab 05/06/15 1057 05/07/15 0420 05/08/15 0445 05/09/15 0420 05/10/15 0530  NA 142 135 130* 138 143  K 4.5 4.2 4.3 4.1 4.5  CL 109 111 107 112* 111  CO2 18* 17* 16* 20* 27  GLUCOSE 112* 105* 83 117* 97  BUN 34* 50* 54* 46* 32*  CREATININE 2.54* 4.50* 3.86* 2.65* 2.02*  CALCIUM 9.7 8.0* 7.3* 8.1* 8.6*    Liver Function Tests:  Recent Labs Lab 05/06/15 1057  AST 26  ALT 20  ALKPHOS 106  BILITOT 0.4  PROT 7.2  ALBUMIN 3.8    Recent Labs Lab 05/06/15 1057  LIPASE 30   No results for input(s): AMMONIA in the last 168 hours.  CBC:  Recent Labs Lab 05/06/15 1057 05/07/15 0420 05/08/15 0445 05/09/15 0420 05/10/15 0530  WBC 26.3* 40.6* 45.5* 35.2* 22.1*  HGB 14.1 11.8* 10.5* 11.0* 11.7*  HCT 43.6 35.6* 32.4* 32.7* 35.1*  MCV 91.7 92.7 91.7 90.8 90.9  PLT 223  118* 97* 90* 107*    Cardiac Enzymes: No results for input(s): CKTOTAL, CKMB, CKMBINDEX, TROPONINI in the last 168 hours.  BNP: Invalid input(s): POCBNP  CBG: No results for input(s): GLUCAP in the last 168 hours.  Microbiology: Results for orders placed or performed during the hospital encounter of 05/06/15  Urine culture     Status: None   Collection Time: 05/06/15 11:12 AM  Result Value Ref Range Status   Specimen Description URINE, RANDOM  Final   Special Requests NONE  Final   Culture   Final    >=100,000 COLONIES/mL ESCHERICHIA COLI >=100,000 COLONIES/mL PSEUDOMONAS AERUGINOSA Results Called to: KRISTA YOUNG AT 1245 05/10/15 CTJ ESBL-EXTENDED SPECTRUM BETA LACTAMASE-THE ORGANISM IS RESISTANT TO PENICILLINS, CEPHALOSPORINS AND AZTREONAM ACCORDING TO CLSI M100-S15 VOL.[place].    Report Status 05/10/2015 FINAL  Final   Organism ID, Bacteria PSEUDOMONAS AERUGINOSA  Final   Organism ID, Bacteria ESCHERICHIA COLI  Final      Susceptibility   Escherichia coli - MIC*    AMPICILLIN >=32 RESISTANT Resistant     CEFTAZIDIME 16 RESISTANT Resistant     CEFAZOLIN >=64 RESISTANT Resistant     CEFTRIAXONE >=64 RESISTANT Resistant     CIPROFLOXACIN >=4 RESISTANT Resistant     GENTAMICIN <=1 SENSITIVE Sensitive  IMIPENEM <=0.25 SENSITIVE Sensitive     TRIMETH/SULFA >=320 RESISTANT Resistant     Extended ESBL POSITIVE Resistant     NITROFURANTOIN Value in next row Sensitive      SENSITIVE<=16    PIP/TAZO Value in next row Intermediate      INTERMEDIATE64    * >=100,000 COLONIES/mL ESCHERICHIA COLI   Pseudomonas aeruginosa - MIC*    CIPROFLOXACIN Value in next row Intermediate      INTERMEDIATE2    GENTAMICIN Value in next row Intermediate      INTERMEDIATE8    IMIPENEM Value in next row Sensitive      SENSITIVE2    PIP/TAZO Value in next row Sensitive      SENSITIVE32    AMPICILLIN Value in next row Resistant      RESISTANT>=32    CEFAZOLIN Value in next row  Resistant      RESISTANT>=64    CEFTRIAXONE Value in next row Resistant      RESISTANT>=64    LEVOFLOXACIN Value in next row Resistant      RESISTANT>=8    NITROFURANTOIN Value in next row Resistant      RESISTANT>=512    TRIMETH/SULFA Value in next row Resistant      RESISTANT>=320    * >=100,000 COLONIES/mL PSEUDOMONAS AERUGINOSA  Culture, blood (routine x 2)     Status: None   Collection Time: 05/06/15  1:08 PM  Result Value Ref Range Status   Specimen Description BLOOD RIGHT AC  Final   Special Requests BOTTLES DRAWN AEROBIC AND ANAEROBIC  3CC  Final   Culture  Setup Time   Final    GRAM NEGATIVE RODS IN BOTH AEROBIC AND ANAEROBIC BOTTLES CRITICAL RESULT CALLED TO, READ BACK BY AND VERIFIED WITH: CHAROLOTTE KYEI AT 2342 05/06/15.PMH CONFIRMED BY WM    Culture   Final    ESCHERICHIA COLI IN BOTH AEROBIC AND ANAEROBIC BOTTLES Results Called to: [facility] TRAVIS AT 0740 05/08/15 DV ESBL-EXTENDED SPECTRUM BETA LACTAMASE-THE ORGANISM IS RESISTANT TO PENICILLINS, CEPHALOSPORINS AND AZTREONAM ACCORDING TO CLSI M100-S15 VOL.[place].    Report Status 05/08/2015 FINAL  Final   Organism ID, Bacteria ESCHERICHIA COLI  Final      Susceptibility   Escherichia coli - MIC*    AMPICILLIN >=32 RESISTANT Resistant     CEFTAZIDIME 16 RESISTANT Resistant     CEFAZOLIN >=64 RESISTANT Resistant     CEFTRIAXONE >=64 RESISTANT Resistant     CIPROFLOXACIN >=4 RESISTANT Resistant     GENTAMICIN <=1 SENSITIVE Sensitive     IMIPENEM <=0.25 SENSITIVE Sensitive     TRIMETH/SULFA >=320 RESISTANT Resistant     Extended ESBL POSITIVE Resistant     PIP/TAZO Value in next row Intermediate      INTERMEDIATE64    * ESCHERICHIA COLI  Culture, blood (routine x 2)     Status: None   Collection Time: 05/06/15  1:13 PM  Result Value Ref Range Status   Specimen Description BLOOD LEFT AC  Final   Special Requests BOTTLES DRAWN AEROBIC AND ANAEROBIC  1CC  Final   Culture  Setup Time   Final    GRAM  NEGATIVE RODS IN BOTH AEROBIC AND ANAEROBIC BOTTLES CRITICAL RESULT CALLED TO, READ BACK BY AND VERIFIED WITH: CHARLOTTE KYEI AT 2146 05/06/15.PMH CONFIRMED BY [facility]    Culture   Final    ESCHERICHIA COLI IN BOTH AEROBIC AND ANAEROBIC BOTTLES Results Called to: [facility] TRAVIS AT 9935 05/08/15 DV ESBL-EXTENDED SPECTRUM BETA LACTAMASE-THE  ORGANISM IS RESISTANT TO PENICILLINS, CEPHALOSPORINS AND AZTREONAM ACCORDING TO CLSI M100-S15 VOL.[place].    Report Status 05/08/2015 FINAL  Final   Organism ID, Bacteria ESCHERICHIA COLI  Final      Susceptibility   Escherichia coli - MIC*    AMPICILLIN >=32 RESISTANT Resistant     CEFTAZIDIME 16 RESISTANT Resistant     CEFAZOLIN >=64 RESISTANT Resistant     CEFTRIAXONE >=64 RESISTANT Resistant     CIPROFLOXACIN >=4 RESISTANT Resistant     GENTAMICIN <=1 SENSITIVE Sensitive     IMIPENEM <=0.25 SENSITIVE Sensitive     TRIMETH/SULFA >=320 RESISTANT Resistant     Extended ESBL POSITIVE Resistant     PIP/TAZO Value in next row Intermediate      INTERMEDIATE64    * ESCHERICHIA COLI  MRSA PCR Screening     Status: None   Collection Time: 05/07/15  7:14 AM  Result Value Ref Range Status   MRSA by PCR NEGATIVE NEGATIVE Final    Comment:        The GeneXpert MRSA Assay (FDA approved for NASAL specimens only), is one component of a comprehensive MRSA colonization surveillance program. It is not intended to diagnose MRSA infection nor to guide or monitor treatment for MRSA infections.     Coagulation Studies: No results for input(s): LABPROT, INR in the last 72 hours.  Urinalysis: No results for input(s): COLORURINE, LABSPEC, PHURINE, GLUCOSEU, HGBUR, BILIRUBINUR, KETONESUR, PROTEINUR, UROBILINOGEN, NITRITE, LEUKOCYTESUR in the last 72 hours.  Invalid input(s): APPERANCEUR    Imaging: Dg Chest Port 1 View  05/08/2015  CLINICAL DATA:  Status post central line placement. EXAM: PORTABLE CHEST 1 VIEW COMPARISON:  May 07, 2015.  FINDINGS: The heart size and mediastinal contours are within normal limits. No pneumothorax or pleural effusion is noted. Increased central pulmonary vascular congestion is noted with probable bilateral perihilar edema. Stable right basilar opacity is noted concerning for pneumonia or atelectasis. Right internal jugular catheter is unchanged in position with distal tip in the expected position the cavoatrial junction. There is been interval placement of right-sided PICC line with distal tip in the right atrium. The visualized skeletal structures are unremarkable. IMPRESSION: Increased central pulmonary vascular congestion is noted with probable bilateral perihilar edema. Stable right basilar opacity is noted concerning for pneumonia or subsegmental atelectasis. Interval placement of right-sided PICC line with distal tip in the expected position of the right atrium. Withdrawal by 3-4 cm is recommended. These results will be called to the ordering clinician or representative by the Radiologist Assistant, and communication documented in the PACS or zVision Dashboard. Electronically Signed   By: Marijo Conception, M.D.   On: 05/08/2015 15:01     Medications:     . darifenacin  7.5 mg Oral Daily  . feeding supplement (ENSURE ENLIVE)  237 mL Oral BID WC  . FLUoxetine  40 mg Oral Daily  . hydrocortisone   Rectal BID  . levothyroxine  50 mcg Oral QAC breakfast  . meropenem (MERREM) IV  500 mg Intravenous Q12H  . mometasone-formoterol  2 puff Inhalation BID  . risperiDONE  3 mg Oral QHS  . sodium chloride  10-40 mL Intracatheter Q12H  . tamsulosin  0.4 mg Oral Daily   acetaminophen **OR** acetaminophen, HYDROmorphone (DILAUDID) injection, ondansetron **OR** ondansetron (ZOFRAN) IV, opium-belladonna, sodium chloride, sodium chloride  Assessment/ Plan:  [name] is a 59 y.o. white male with schizophrenia, seizure disorder, CVA, coronary artery disease, COPD, peptic ulcer disease,  hypothyroidism,  right nephrectomy, nephrolithiasis and neurogenic bladder.   1. Acute renal failure on chronic kidney disease stage III: baseline creatinine of 1.95, eGFR of 35. Acute renal failure from sepsis, urinary tract infection and possibly obstructive uropathy.  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  Creatinine seems to be at baseline With polyuria: unclear if patient is having post ATN/post obstructive diuresis versus having an underlying diabetes insipidus or psychogenic polydipsia.  - Check urine and serum osm Continue monitoring renal function, urine output, and serum electrolytes. Post ATN diuresis can cause electrolyte wasting  2. 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  Now off bicarb gtt  3. Hyponatremia: improved with IV sodium bicarbonate  4. Sepsis secondary to urinary tract infection: renally dosed meropenem. E. Coli on urine culture. Now off vasopressors.   Appreciate ID input     LOS: Alan Small, Alan Small 12/8/20161:37 PM

## 2015-05-11 LAB — BASIC METABOLIC PANEL
Anion gap: 5 (ref 5–15)
BUN: 37 mg/dL — AB (ref 6–20)
CO2: 24 mmol/L (ref 22–32)
CREATININE: 1.88 mg/dL — AB (ref 0.61–1.24)
Calcium: 9 mg/dL (ref 8.9–10.3)
Chloride: 112 mmol/L — ABNORMAL HIGH (ref 101–111)
GFR calc Af Amer: 43 mL/min — ABNORMAL LOW (ref >60)
GFR, EST NON AFRICAN AMERICAN: 37 mL/min — AB (ref >60)
Glucose, Bld: 89 mg/dL (ref 65–99)
Potassium: 4.8 mmol/L (ref 3.5–5.1)
SODIUM: 141 mmol/L (ref 135–145)

## 2015-05-11 LAB — CBC
HCT: 36.4 % — ABNORMAL LOW (ref 40.0–52.0)
Hemoglobin: 12.2 g/dL — ABNORMAL LOW (ref 13.0–18.0)
MCH: 30.2 pg (ref 26.0–34.0)
MCHC: 33.4 g/dL (ref 32.0–36.0)
MCV: 90.4 fL (ref 80.0–100.0)
PLATELETS: 99 10*3/uL — AB (ref 150–440)
RBC: 4.03 MIL/uL — ABNORMAL LOW (ref 4.40–5.90)
RDW: 15 % — AB (ref 11.5–14.5)
WBC: 16.7 10*3/uL — AB (ref 3.8–10.6)

## 2015-05-11 LAB — HEPARIN INDUCED PLATELET AB (HIT ANTIBODY): HEPARIN INDUCED PLT AB: 0.254 {OD_unit} (ref 0.000–0.400)

## 2015-05-11 MED ORDER — HYDROCORTISONE 2.5 % RE CREA: TOPICAL_CREAM | Freq: Two times a day (BID) | RECTAL | Status: DC

## 2015-05-11 MED ORDER — BELLADONNA ALKALOIDS-OPIUM 16.2-60 MG RE SUPP: 1.0000 | Freq: Four times a day (QID) | RECTAL | Status: DC | PRN

## 2015-05-11 MED ORDER — ENSURE ENLIVE PO LIQD: 237.0000 mL | Freq: Two times a day (BID) | ORAL | Status: DC

## 2015-05-11 MED ORDER — ALUM & MAG HYDROXIDE-SIMETH 200-200-20 MG/5ML PO SUSP
30.0000 mL | Freq: Four times a day (QID) | ORAL | Status: DC | PRN
  Administered 2015-05-11 – 2015-05-12 (×2): 30 mL via ORAL
  Filled 2015-05-11 (×2): qty 30

## 2015-05-11 MED ORDER — SODIUM BICARBONATE 650 MG PO TABS: 650.0000 mg | ORAL_TABLET | Freq: Two times a day (BID) | ORAL | Status: DC

## 2015-05-11 MED ORDER — SODIUM CHLORIDE 0.9 % IV SOLN: 500.0000 mg | Freq: Two times a day (BID) | INTRAVENOUS | Status: DC

## 2015-05-11 MED ORDER — RISPERIDONE 3 MG PO TABS: 3.0000 mg | ORAL_TABLET | Freq: Every day | ORAL | Status: DC

## 2015-05-11 NOTE — Progress Notes (Signed)
[facility]  ROUNDING NOTE   Subjective:   Creatinine 1.88 (2.02) (2.65) (3.86) UOP 4050 (9400) (10750)    Objective:  Vital signs in last 24 hours:  Temp:  [97.5 F (36.4 C)-98.7 F (37.1 C)] 97.5 F (36.4 C) (12/09 0903) Pulse Rate:  [74-101] 101 (12/09 0903) Resp:  [17-18] 18 (12/09 0903) BP: (118-133)/(75-87) 118/75 mmHg (12/09 0903) SpO2:  [95 %-96 %] 95 % (12/09 0903)  Weight change:  Filed Weights   05/06/15 1054 05/06/15 1853 05/07/15 0700  Weight: 70.761 kg (156 lb) 71.668 kg (158 lb) 74.8 kg (164 lb 14.5 oz)    Intake/Output: I/O last 3 completed shifts: In: 150 [I.V.:50; IV Piggyback:100] Out: 54270 [Urine:11150]   Intake/Output this shift:     Physical Exam: General: NAD, sitting up in bed.   Head: +temporal wasting. Moist oral mucosal membranes  Eyes: Anicteric, PERRL  Neck: Supple, trachea midline  Lungs:  Clear to auscultation  Heart: Regular rate and rhythm  Abdomen:  Soft, nontender,   Extremities:  no peripheral edema.  Neurologic: Nonfocal, moving all four extremities  Skin: No lesions       Basic Metabolic Panel:  Recent Labs Lab 05/07/15 0420 05/08/15 0445 05/09/15 0420 05/10/15 0530 05/11/15 0414  NA 135 130* 138 143 141  K 4.2 4.3 4.1 4.5 4.8  CL 111 107 112* 111 112*  CO2 17* 16* 20* 27 24  GLUCOSE 105* 83 117* 97 89  BUN 50* 54* 46* 32* 37*  CREATININE 4.50* 3.86* 2.65* 2.02* 1.88*  CALCIUM 8.0* 7.3* 8.1* 8.6* 9.0    Liver Function Tests:  Recent Labs Lab 05/06/15 1057  AST 26  ALT 20  ALKPHOS 106  BILITOT 0.4  PROT 7.2  ALBUMIN 3.8    Recent Labs Lab 05/06/15 1057  LIPASE 30   No results for input(s): AMMONIA in the last 168 hours.  CBC:  Recent Labs Lab 05/07/15 0420 05/08/15 0445 05/09/15 0420 05/10/15 0530 05/11/15 0414  WBC 40.6* 45.5* 35.2* 22.1* 16.7*  HGB 11.8* 10.5* 11.0* 11.7* 12.2*  HCT 35.6* 32.4* 32.7* 35.1* 36.4*  MCV 92.7 91.7 90.8 90.9 90.4  PLT 118* 97* 90* 107*  99*    Cardiac Enzymes: No results for input(s): CKTOTAL, CKMB, CKMBINDEX, TROPONINI in the last 168 hours.  BNP: Invalid input(s): POCBNP  CBG: No results for input(s): GLUCAP in the last 168 hours.  Microbiology: Results for orders placed or performed during the hospital encounter of 05/06/15  Urine culture     Status: None   Collection Time: 05/06/15 11:12 AM  Result Value Ref Range Status   Specimen Description URINE, RANDOM  Final   Special Requests NONE  Final   Culture   Final    >=100,000 COLONIES/mL ESCHERICHIA COLI >=100,000 COLONIES/mL PSEUDOMONAS AERUGINOSA Results Called to: KRISTA YOUNG AT 1245 05/10/15 CTJ ESBL-EXTENDED SPECTRUM BETA LACTAMASE-THE ORGANISM IS RESISTANT TO PENICILLINS, CEPHALOSPORINS AND AZTREONAM ACCORDING TO CLSI M100-S15 VOL.[place].    Report Status 05/10/2015 FINAL  Final   Organism ID, Bacteria PSEUDOMONAS AERUGINOSA  Final   Organism ID, Bacteria ESCHERICHIA COLI  Final      Susceptibility   Escherichia coli - MIC*    AMPICILLIN >=32 RESISTANT Resistant     CEFTAZIDIME 16 RESISTANT Resistant     CEFAZOLIN >=64 RESISTANT Resistant     CEFTRIAXONE >=64 RESISTANT Resistant     CIPROFLOXACIN >=4 RESISTANT Resistant     GENTAMICIN <=1 SENSITIVE Sensitive     IMIPENEM <=0.25  SENSITIVE Sensitive     TRIMETH/SULFA >=320 RESISTANT Resistant     Extended ESBL POSITIVE Resistant     NITROFURANTOIN Value in next row Sensitive      SENSITIVE<=16    PIP/TAZO Value in next row Intermediate      INTERMEDIATE64    * >=100,000 COLONIES/mL ESCHERICHIA COLI   Pseudomonas aeruginosa - MIC*    CIPROFLOXACIN Value in next row Intermediate      INTERMEDIATE2    GENTAMICIN Value in next row Intermediate      INTERMEDIATE8    IMIPENEM Value in next row Sensitive      SENSITIVE2    PIP/TAZO Value in next row Sensitive      SENSITIVE32    AMPICILLIN Value in next row Resistant      RESISTANT>=32    CEFAZOLIN Value in next row Resistant       RESISTANT>=64    CEFTRIAXONE Value in next row Resistant      RESISTANT>=64    LEVOFLOXACIN Value in next row Resistant      RESISTANT>=8    NITROFURANTOIN Value in next row Resistant      RESISTANT>=512    TRIMETH/SULFA Value in next row Resistant      RESISTANT>=320    * >=100,000 COLONIES/mL PSEUDOMONAS AERUGINOSA  Culture, blood (routine x 2)     Status: None   Collection Time: 05/06/15  1:08 PM  Result Value Ref Range Status   Specimen Description BLOOD RIGHT AC  Final   Special Requests BOTTLES DRAWN AEROBIC AND ANAEROBIC  3CC  Final   Culture  Setup Time   Final    GRAM NEGATIVE RODS IN BOTH AEROBIC AND ANAEROBIC BOTTLES CRITICAL RESULT CALLED TO, READ BACK BY AND VERIFIED WITH: CHAROLOTTE KYEI AT 2342 05/06/15.PMH CONFIRMED BY WM    Culture   Final    ESCHERICHIA COLI IN BOTH AEROBIC AND ANAEROBIC BOTTLES Results Called to: [facility] TRAVIS AT 0740 05/08/15 DV ESBL-EXTENDED SPECTRUM BETA LACTAMASE-THE ORGANISM IS RESISTANT TO PENICILLINS, CEPHALOSPORINS AND AZTREONAM ACCORDING TO CLSI M100-S15 VOL.[place].    Report Status 05/08/2015 FINAL  Final   Organism ID, Bacteria ESCHERICHIA COLI  Final      Susceptibility   Escherichia coli - MIC*    AMPICILLIN >=32 RESISTANT Resistant     CEFTAZIDIME 16 RESISTANT Resistant     CEFAZOLIN >=64 RESISTANT Resistant     CEFTRIAXONE >=64 RESISTANT Resistant     CIPROFLOXACIN >=4 RESISTANT Resistant     GENTAMICIN <=1 SENSITIVE Sensitive     IMIPENEM <=0.25 SENSITIVE Sensitive     TRIMETH/SULFA >=320 RESISTANT Resistant     Extended ESBL POSITIVE Resistant     PIP/TAZO Value in next row Intermediate      INTERMEDIATE64    * ESCHERICHIA COLI  Culture, blood (routine x 2)     Status: None   Collection Time: 05/06/15  1:13 PM  Result Value Ref Range Status   Specimen Description BLOOD LEFT AC  Final   Special Requests BOTTLES DRAWN AEROBIC AND ANAEROBIC  1CC  Final   Culture  Setup Time   Final    GRAM NEGATIVE RODS IN  BOTH AEROBIC AND ANAEROBIC BOTTLES CRITICAL RESULT CALLED TO, READ BACK BY AND VERIFIED WITH: CHARLOTTE KYEI AT 2146 05/06/15.PMH CONFIRMED BY CAF    Culture   Final    ESCHERICHIA COLI IN BOTH AEROBIC AND ANAEROBIC BOTTLES Results Called to: [facility] TRAVIS AT 9390 05/08/15 DV ESBL-EXTENDED SPECTRUM BETA LACTAMASE-THE ORGANISM IS  RESISTANT TO PENICILLINS, CEPHALOSPORINS AND AZTREONAM ACCORDING TO CLSI M100-S15 VOL.[place].    Report Status 05/08/2015 FINAL  Final   Organism ID, Bacteria ESCHERICHIA COLI  Final      Susceptibility   Escherichia coli - MIC*    AMPICILLIN >=32 RESISTANT Resistant     CEFTAZIDIME 16 RESISTANT Resistant     CEFAZOLIN >=64 RESISTANT Resistant     CEFTRIAXONE >=64 RESISTANT Resistant     CIPROFLOXACIN >=4 RESISTANT Resistant     GENTAMICIN <=1 SENSITIVE Sensitive     IMIPENEM <=0.25 SENSITIVE Sensitive     TRIMETH/SULFA >=320 RESISTANT Resistant     Extended ESBL POSITIVE Resistant     PIP/TAZO Value in next row Intermediate      INTERMEDIATE64    * ESCHERICHIA COLI  MRSA PCR Screening     Status: None   Collection Time: 05/07/15  7:14 AM  Result Value Ref Range Status   MRSA by PCR NEGATIVE NEGATIVE Final    Comment:        The GeneXpert MRSA Assay (FDA approved for NASAL specimens only), is one component of a comprehensive MRSA colonization surveillance program. It is not intended to diagnose MRSA infection nor to guide or monitor treatment for MRSA infections.     Coagulation Studies: No results for input(s): LABPROT, INR in the last 72 hours.  Urinalysis: No results for input(s): COLORURINE, LABSPEC, PHURINE, GLUCOSEU, HGBUR, BILIRUBINUR, KETONESUR, PROTEINUR, UROBILINOGEN, NITRITE, LEUKOCYTESUR in the last 72 hours.  Invalid input(s): APPERANCEUR    Imaging: No results found.   Medications:     . darifenacin  7.5 mg Oral Daily  . feeding supplement (ENSURE ENLIVE)  237 mL Oral BID WC  . FLUoxetine  40 mg Oral Daily   . hydrocortisone   Rectal BID  . levothyroxine  50 mcg Oral QAC breakfast  . meropenem (MERREM) IV  500 mg Intravenous Q12H  . mometasone-formoterol  2 puff Inhalation BID  . risperiDONE  3 mg Oral QHS  . sodium chloride  10-40 mL Intracatheter Q12H  . tamsulosin  0.4 mg Oral Daily   acetaminophen **OR** acetaminophen, HYDROmorphone (DILAUDID) injection, ondansetron **OR** ondansetron (ZOFRAN) IV, opium-belladonna, sodium chloride, sodium chloride  Assessment/ Plan:  [name] is a 59 y.o. white male with schizophrenia, seizure disorder, CVA, coronary artery disease, COPD, peptic ulcer disease, hypothyroidism, right nephrectomy, nephrolithiasis and neurogenic bladder.   1. Acute renal failure on chronic kidney disease stage III: baseline creatinine of 1.95, eGFR of 35. Acute renal failure from sepsis, urinary tract infection and possibly obstructive uropathy.  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  Creatinine seems to be at baseline With polyuria: most likely due to post ATN/post obstructive diuresis and less likely to patient having an underlying diabetes insipidus or psychogenic polydipsia.  Continue monitoring renal function, urine output, and serum electrolytes. Post ATN diuresis can cause electrolyte wasting  2. 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 CO2 now at goal.  Now off bicarb gtt  3. Hyponatremia: improved and now 141.   4. Sepsis secondary to urinary tract infection: renally dosed meropenem. E. Coli on urine culture. Now off vasopressors.   Appreciate ID input: meropenem   LOS: [place], [place] 12/9/201610:31 AM

## 2015-05-11 NOTE — Clinical Social Work Note (Signed)
CSW initiated SNF search for pt with IV ABX. CSW initiated [facility], which is pending. CSW updated facility. CSW also left a message for DSS guardian. MD is aware. CSW will continue to follow.   Darden Dates, MSW, LCSW Clinical Social Worker  [phone]

## 2015-05-11 NOTE — Care Management Important Message (Signed)
Important Message  Patient Details  Name: Alan Small MRN: [id number] Date of Birth: 1956-05-30   Medicare Important Message Given:  Yes    Shelbie Ammons, RN 05/11/2015, 10:56 AM

## 2015-05-11 NOTE — Progress Notes (Signed)
[facility] INFECTIOUS DISEASE PROGRESS NOTE Date of Admission:  05/06/2015     ID: Alan Small is a 59 y.o. male with ESBL E coli urosepsis Active Problems:   Sepsis ([place])   Malnutrition of moderate degree   Subjective: Off pressors, HD stable. Transferring to floor. Had rectal prolapse today. Urine clear. No diarrhea or abd pain.   ROS  Eleven systems are reviewed and negative except per hpi  Medications:  Antibiotics Given (last 72 hours)    Date/Time Action Medication Dose Rate   05/08/15 2217 Given   meropenem (MERREM) 500 mg in sodium chloride 0.9 % 50 mL IVPB 500 mg 100 mL/hr   05/09/15 1016 Given   meropenem (MERREM) 500 mg in sodium chloride 0.9 % 50 mL IVPB 500 mg 100 mL/hr   05/09/15 2259 Given   meropenem (MERREM) 500 mg in sodium chloride 0.9 % 50 mL IVPB 500 mg 100 mL/hr   05/10/15 1038 Given   meropenem (MERREM) 500 mg in sodium chloride 0.9 % 50 mL IVPB 500 mg 100 mL/hr   05/10/15 2142 Given   meropenem (MERREM) 500 mg in sodium chloride 0.9 % 50 mL IVPB 500 mg 100 mL/hr   05/11/15 0915 Given   meropenem (MERREM) 500 mg in sodium chloride 0.9 % 50 mL IVPB 500 mg 100 mL/hr     . darifenacin  7.5 mg Oral Daily  . feeding supplement (ENSURE ENLIVE)  237 mL Oral BID WC  . FLUoxetine  40 mg Oral Daily  . hydrocortisone   Rectal BID  . levothyroxine  50 mcg Oral QAC breakfast  . meropenem (MERREM) IV  500 mg Intravenous Q12H  . mometasone-formoterol  2 puff Inhalation BID  . risperiDONE  3 mg Oral QHS  . sodium chloride  10-40 mL Intracatheter Q12H  . tamsulosin  0.4 mg Oral Daily    Objective: Vital signs in last 24 hours: Temp:  [97.5 F (36.4 C)-98.1 F (36.7 C)] 97.5 F (36.4 C) (12/09 0903) Pulse Rate:  [74-101] 101 (12/09 0903) Resp:  [17-18] 18 (12/09 0903) BP: (118-133)/(75-87) 118/75 mmHg (12/09 0903) SpO2:  [95 %-96 %] 95 % (12/09 0903) GENERAL: 59 y.o.-year-old patient lying in the bed with no acute distress.EYES: Pupils equal, round,  reactive to light and accommodation. No scleral icterus. Extraocular muscles intact.  HEENT: Head atraumatic, normocephalic. Oropharynx and nasopharynx clear.  NECK: Supple, no jugular venous distention. No thyroid enlargement, no tenderness.  LUNGS: Normal breath sounds bilaterally, no wheezing, rales,rhonchi or crepitation. No use of accessory muscles of respiration.  CARDIOVASCULAR: S1, S2 normal. No murmurs, rubs, or gallops.  ABDOMEN: Soft, nontender, nondistended. Bowel sounds present. No organomegaly or mass. Some suprapubic discomfort on palpation noted. Has a chronic indwelling Foley catheter. EXTREMITIES: No pedal edema, cyanosis, or clubbing.  NEUROLOGIC: Cranial nerves II through XII are intact. Muscle strength 5/5 in all extremities. Sensation intact. Gait not checked.  PSYCHIATRIC: The patient is alert and oriented x 3.  SKIN: No obvious rash, lesion, or ulcer Lab Results  Recent Labs  05/10/15 0530 05/11/15 0414  WBC 22.1* 16.7*  HGB 11.7* 12.2*  HCT 35.1* 36.4*  NA 143 141  K 4.5 4.8  CL 111 112*  CO2 27 24  BUN 32* 37*  CREATININE 2.02* 1.88*    Microbiology: Results for orders placed or performed during the hospital encounter of 05/06/15  Urine culture     Status: None   Collection Time: 05/06/15 11:12 AM  Result Value Ref Range  Status   Specimen Description URINE, RANDOM  Final   Special Requests NONE  Final   Culture   Final    >=100,000 COLONIES/mL ESCHERICHIA COLI >=100,000 COLONIES/mL PSEUDOMONAS AERUGINOSA Results Called to: KRISTA YOUNG AT [id number] 05/10/15 CTJ ESBL-EXTENDED SPECTRUM BETA LACTAMASE-THE ORGANISM IS RESISTANT TO PENICILLINS, CEPHALOSPORINS AND AZTREONAM ACCORDING TO CLSI M100-S15 VOL.[place].    Report Status 05/10/2015 FINAL  Final   Organism ID, Bacteria PSEUDOMONAS AERUGINOSA  Final   Organism ID, Bacteria ESCHERICHIA COLI  Final      Susceptibility   Escherichia coli - MIC*    AMPICILLIN >=32 RESISTANT Resistant      CEFTAZIDIME 16 RESISTANT Resistant     CEFAZOLIN >=64 RESISTANT Resistant     CEFTRIAXONE >=64 RESISTANT Resistant     CIPROFLOXACIN >=4 RESISTANT Resistant     GENTAMICIN <=1 SENSITIVE Sensitive     IMIPENEM <=0.25 SENSITIVE Sensitive     TRIMETH/SULFA >=320 RESISTANT Resistant     Extended ESBL POSITIVE Resistant     NITROFURANTOIN Value in next row Sensitive      SENSITIVE<=16    PIP/TAZO Value in next row Intermediate      INTERMEDIATE64    * >=100,000 COLONIES/mL ESCHERICHIA COLI   Pseudomonas aeruginosa - MIC*    CIPROFLOXACIN Value in next row Intermediate      INTERMEDIATE2    GENTAMICIN Value in next row Intermediate      INTERMEDIATE8    IMIPENEM Value in next row Sensitive      SENSITIVE2    PIP/TAZO Value in next row Sensitive      SENSITIVE32    AMPICILLIN Value in next row Resistant      RESISTANT>=32    CEFAZOLIN Value in next row Resistant      RESISTANT>=64    CEFTRIAXONE Value in next row Resistant      RESISTANT>=64    LEVOFLOXACIN Value in next row Resistant      RESISTANT>=8    NITROFURANTOIN Value in next row Resistant      RESISTANT>=512    TRIMETH/SULFA Value in next row Resistant      RESISTANT>=320    * >=100,000 COLONIES/mL PSEUDOMONAS AERUGINOSA  Culture, blood (routine x 2)     Status: None   Collection Time: 05/06/15  1:08 PM  Result Value Ref Range Status   Specimen Description BLOOD RIGHT AC  Final   Special Requests BOTTLES DRAWN AEROBIC AND ANAEROBIC  3CC  Final   Culture  Setup Time   Final    GRAM NEGATIVE RODS IN BOTH AEROBIC AND ANAEROBIC BOTTLES CRITICAL RESULT CALLED TO, READ BACK BY AND VERIFIED WITH: CHAROLOTTE KYEI AT 2342 05/06/15.PMH CONFIRMED BY WM    Culture   Final    ESCHERICHIA COLI IN BOTH AEROBIC AND ANAEROBIC BOTTLES Results Called to: [facility] TRAVIS AT 0740 05/08/15 DV ESBL-EXTENDED SPECTRUM BETA LACTAMASE-THE ORGANISM IS RESISTANT TO PENICILLINS, CEPHALOSPORINS AND AZTREONAM ACCORDING TO CLSI M100-S15 VOL.[place].    Report Status 05/08/2015 FINAL  Final   Organism ID, Bacteria ESCHERICHIA COLI  Final      Susceptibility   Escherichia coli - MIC*    AMPICILLIN >=32 RESISTANT Resistant     CEFTAZIDIME 16 RESISTANT Resistant     CEFAZOLIN >=64 RESISTANT Resistant     CEFTRIAXONE >=64 RESISTANT Resistant     CIPROFLOXACIN >=4 RESISTANT Resistant     GENTAMICIN <=1 SENSITIVE Sensitive     IMIPENEM <=0.25 SENSITIVE Sensitive  TRIMETH/SULFA >=320 RESISTANT Resistant     Extended ESBL POSITIVE Resistant     PIP/TAZO Value in next row Intermediate      INTERMEDIATE64    * ESCHERICHIA COLI  Culture, blood (routine x 2)     Status: None   Collection Time: 05/06/15  1:13 PM  Result Value Ref Range Status   Specimen Description BLOOD LEFT AC  Final   Special Requests BOTTLES DRAWN AEROBIC AND ANAEROBIC  1CC  Final   Culture  Setup Time   Final    GRAM NEGATIVE RODS IN BOTH AEROBIC AND ANAEROBIC BOTTLES CRITICAL RESULT CALLED TO, READ BACK BY AND VERIFIED WITH: CHARLOTTE KYEI AT 2146 05/06/15.PMH CONFIRMED BY CAF    Culture   Final    ESCHERICHIA COLI IN BOTH AEROBIC AND ANAEROBIC BOTTLES Results Called to: [facility] TRAVIS AT 0740 05/08/15 DV ESBL-EXTENDED SPECTRUM BETA LACTAMASE-THE ORGANISM IS RESISTANT TO PENICILLINS, CEPHALOSPORINS AND AZTREONAM ACCORDING TO CLSI M100-S15 VOL.[place].    Report Status 05/08/2015 FINAL  Final   Organism ID, Bacteria ESCHERICHIA COLI  Final      Susceptibility   Escherichia coli - MIC*    AMPICILLIN >=32 RESISTANT Resistant     CEFTAZIDIME 16 RESISTANT Resistant     CEFAZOLIN >=64 RESISTANT Resistant     CEFTRIAXONE >=64 RESISTANT Resistant     CIPROFLOXACIN >=4 RESISTANT Resistant     GENTAMICIN <=1 SENSITIVE Sensitive     IMIPENEM <=0.25 SENSITIVE Sensitive     TRIMETH/SULFA >=320 RESISTANT Resistant     Extended ESBL POSITIVE Resistant     PIP/TAZO Value in next row Intermediate      INTERMEDIATE64    * ESCHERICHIA COLI  MRSA PCR  Screening     Status: None   Collection Time: 05/07/15  7:14 AM  Result Value Ref Range Status   MRSA by PCR NEGATIVE NEGATIVE Final    Comment:        The GeneXpert MRSA Assay (FDA approved for NASAL specimens only), is one component of a comprehensive MRSA colonization surveillance program. It is not intended to diagnose MRSA infection nor to guide or monitor treatment for MRSA infections.     Studies/Results: No results found.  Assessment/Plan: ALEXNADER RUCCI is a 59 y.o. 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 He was seen by urology in Nov and placed on bactrim suppression. Admitted with cloudy urine, some possible foley clotting, fevers and leukocytosis. He has ESBL E coli bacteremia. Has hx MDRO UTI and current cx with GNR. Foley exchanged and urine now clear. HD stable, no fevers but wbc markedly elevated. No diarrhea to suggest C diff. Renal USS neg for obstruction hydronephrosis or renal abscess Had rectal prolapse - reduced.  WBC trending down and clinically much better.  Still has foley  BCX ESBL E coli, UCX Pseduomonas and E coli Recommendations Cont  meropenem x 10 days total  Advised pt to follow up with urology at [facility] and consider stopping urinary cath and use Intermittent cath . Thank you very much for the consult. Will follow with you.  [place], [place]   05/11/2015, 1:22 PM

## 2015-05-11 NOTE — Plan of Care (Signed)
Problem: Education: Goal: Knowledge of [facility] Education information/materials will improve Outcome: Progressing Pt has no c/o pain at this time, resting quietly. Disoriented to time, pt reoriented.  IV antibiotics continued. Encouraged to call out for assistance when needed.     Problem: Fluid Volume: Goal: Hemodynamic stability will improve Outcome: Progressing Foley catheter remains intact and patent. PO intake encouraged.

## 2015-05-11 NOTE — Plan of Care (Signed)
Problem: Education: Goal: Knowledge of [facility] [facility] Education information/materials will improve Outcome: Progressing Has received General Education Handout. Reoriented to Oncology unit.   Instructed and Demonstrated how to use phone to call RN and CNA for Assistance. Verbalized and demonstrated.  Pt has no c/o pain at this time, resting quietly. Disoriented to time, pt reoriented.   IV antibiotics continued. Encouraged to call out for assistance when needed.  Problem: Physical Regulation: Goal: Will remain free from infection Outcome: Progressing Remains on IV Antibiotics. WBC's Improving.  Problem: Spiritual Needs Goal: Ability to function at adequate level Outcome: Progressing PT Evaluation.  Problem: Fluid Volume: Goal: Hemodynamic stability will improve Outcome: Progressing PO intake encouraged. Foley catheter remains intact and patent.        Problem: Physical Regulation: Goal: Signs and symptoms of infection will decrease Outcome: Progressing Remains on IV Antibiotics. WBC's Improving.

## 2015-05-11 NOTE — Progress Notes (Signed)
ANTIBIOTIC CONSULT NOTE - INITIAL  Pharmacy Consult for Meropenem Indication: sepsis  Allergies  Allergen Reactions  . Mellaril [Thioridazine] Other (See Comments)    States sinuses stops/ swell up   . Morphine And Related Itching  . Navane [Thiothixene] Other (See Comments)    GI Distress  . Thorazine [Chlorpromazine] Other (See Comments)    Sensitive to sunlight. Sunburns easily    Patient Measurements: Height: 6' (182.9 cm) Weight: 164 lb 14.5 oz (74.8 kg) IBW/kg (Calculated) : 77.6 Adjusted Body Weight:   Vital Signs: Temp: 97.5 F (36.4 C) (12/09 0903) Temp Source: Oral (12/09 0903) BP: 118/75 mmHg (12/09 0903) Pulse Rate: 101 (12/09 0903) Intake/Output from previous day: 12/08 0701 - 12/09 0700 In: 150 [I.V.:50; IV Piggyback:100] Out: [id number] [Urine:4050] Intake/Output from this shift:    Labs:  Recent Labs  05/09/15 0420 05/10/15 0530 05/11/15 0414  WBC 35.2* 22.1* 16.7*  HGB 11.0* 11.7* 12.2*  PLT 90* 107* 99*  CREATININE 2.65* 2.02* 1.88*   Estimated Creatinine Clearance: 44.2 mL/min (by C-G formula based on Cr of 1.88). No results for input(s): VANCOTROUGH, VANCOPEAK, VANCORANDOM, GENTTROUGH, GENTPEAK, GENTRANDOM, TOBRATROUGH, TOBRAPEAK, TOBRARND, AMIKACINPEAK, AMIKACINTROU, AMIKACIN in the last 72 hours.   Microbiology: Recent Results (from the past 720 hour(s))  Urine culture     Status: None   Collection Time: 05/06/15 11:12 AM  Result Value Ref Range Status   Specimen Description URINE, RANDOM  Final   Special Requests NONE  Final   Culture   Final    >=100,000 COLONIES/mL ESCHERICHIA COLI >=100,000 COLONIES/mL PSEUDOMONAS AERUGINOSA Results Called to: KRISTA YOUNG AT 1245 05/10/15 CTJ ESBL-EXTENDED SPECTRUM BETA LACTAMASE-THE ORGANISM IS RESISTANT TO PENICILLINS, CEPHALOSPORINS AND AZTREONAM ACCORDING TO CLSI M100-S15 VOL.[place].    Report Status 05/10/2015 FINAL  Final   Organism ID, Bacteria PSEUDOMONAS AERUGINOSA  Final    Organism ID, Bacteria ESCHERICHIA COLI  Final      Susceptibility   Escherichia coli - MIC*    AMPICILLIN >=32 RESISTANT Resistant     CEFTAZIDIME 16 RESISTANT Resistant     CEFAZOLIN >=64 RESISTANT Resistant     CEFTRIAXONE >=64 RESISTANT Resistant     CIPROFLOXACIN >=4 RESISTANT Resistant     GENTAMICIN <=1 SENSITIVE Sensitive     IMIPENEM <=0.25 SENSITIVE Sensitive     TRIMETH/SULFA >=320 RESISTANT Resistant     Extended ESBL POSITIVE Resistant     NITROFURANTOIN Value in next row Sensitive      SENSITIVE<=16    PIP/TAZO Value in next row Intermediate      INTERMEDIATE64    * >=100,000 COLONIES/mL ESCHERICHIA COLI   Pseudomonas aeruginosa - MIC*    CIPROFLOXACIN Value in next row Intermediate      INTERMEDIATE2    GENTAMICIN Value in next row Intermediate      INTERMEDIATE8    IMIPENEM Value in next row Sensitive      SENSITIVE2    PIP/TAZO Value in next row Sensitive      SENSITIVE32    AMPICILLIN Value in next row Resistant      RESISTANT>=32    CEFAZOLIN Value in next row Resistant      RESISTANT>=64    CEFTRIAXONE Value in next row Resistant      RESISTANT>=64    LEVOFLOXACIN Value in next row Resistant      RESISTANT>=8    NITROFURANTOIN Value in next row Resistant      RESISTANT>=512    TRIMETH/SULFA Value in next row Resistant  RESISTANT>=320    * >=100,000 COLONIES/mL PSEUDOMONAS AERUGINOSA  Culture, blood (routine x 2)     Status: None   Collection Time: 05/06/15  1:08 PM  Result Value Ref Range Status   Specimen Description BLOOD RIGHT AC  Final   Special Requests BOTTLES DRAWN AEROBIC AND ANAEROBIC  3CC  Final   Culture  Setup Time   Final    GRAM NEGATIVE RODS IN BOTH AEROBIC AND ANAEROBIC BOTTLES CRITICAL RESULT CALLED TO, READ BACK BY AND VERIFIED WITH: CHAROLOTTE KYEI AT 2342 05/06/15.PMH CONFIRMED BY WM    Culture   Final    ESCHERICHIA COLI IN BOTH AEROBIC AND ANAEROBIC BOTTLES Results Called to: [facility] TRAVIS AT 0740 05/08/15  DV ESBL-EXTENDED SPECTRUM BETA LACTAMASE-THE ORGANISM IS RESISTANT TO PENICILLINS, CEPHALOSPORINS AND AZTREONAM ACCORDING TO CLSI M100-S15 VOL.[place].    Report Status 05/08/2015 FINAL  Final   Organism ID, Bacteria ESCHERICHIA COLI  Final      Susceptibility   Escherichia coli - MIC*    AMPICILLIN >=32 RESISTANT Resistant     CEFTAZIDIME 16 RESISTANT Resistant     CEFAZOLIN >=64 RESISTANT Resistant     CEFTRIAXONE >=64 RESISTANT Resistant     CIPROFLOXACIN >=4 RESISTANT Resistant     GENTAMICIN <=1 SENSITIVE Sensitive     IMIPENEM <=0.25 SENSITIVE Sensitive     TRIMETH/SULFA >=320 RESISTANT Resistant     Extended ESBL POSITIVE Resistant     PIP/TAZO Value in next row Intermediate      INTERMEDIATE64    * ESCHERICHIA COLI  Culture, blood (routine x 2)     Status: None   Collection Time: 05/06/15  1:13 PM  Result Value Ref Range Status   Specimen Description BLOOD LEFT AC  Final   Special Requests BOTTLES DRAWN AEROBIC AND ANAEROBIC  1CC  Final   Culture  Setup Time   Final    GRAM NEGATIVE RODS IN BOTH AEROBIC AND ANAEROBIC BOTTLES CRITICAL RESULT CALLED TO, READ BACK BY AND VERIFIED WITH: CHARLOTTE KYEI AT 2146 05/06/15.PMH CONFIRMED BY CAF    Culture   Final    ESCHERICHIA COLI IN BOTH AEROBIC AND ANAEROBIC BOTTLES Results Called to: [facility] TRAVIS AT 0740 05/08/15 DV ESBL-EXTENDED SPECTRUM BETA LACTAMASE-THE ORGANISM IS RESISTANT TO PENICILLINS, CEPHALOSPORINS AND AZTREONAM ACCORDING TO CLSI M100-S15 VOL.[place].    Report Status 05/08/2015 FINAL  Final   Organism ID, Bacteria ESCHERICHIA COLI  Final      Susceptibility   Escherichia coli - MIC*    AMPICILLIN >=32 RESISTANT Resistant     CEFTAZIDIME 16 RESISTANT Resistant     CEFAZOLIN >=64 RESISTANT Resistant     CEFTRIAXONE >=64 RESISTANT Resistant     CIPROFLOXACIN >=4 RESISTANT Resistant     GENTAMICIN <=1 SENSITIVE Sensitive     IMIPENEM <=0.25 SENSITIVE Sensitive     TRIMETH/SULFA >=320  RESISTANT Resistant     Extended ESBL POSITIVE Resistant     PIP/TAZO Value in next row Intermediate      INTERMEDIATE64    * ESCHERICHIA COLI  MRSA PCR Screening     Status: None   Collection Time: 05/07/15  7:14 AM  Result Value Ref Range Status   MRSA by PCR NEGATIVE NEGATIVE Final    Comment:        The GeneXpert MRSA Assay (FDA approved for NASAL specimens only), is one component of a comprehensive MRSA colonization surveillance program. It is not intended to diagnose MRSA infection nor to guide or  monitor treatment for MRSA infections.     Medical History: Past Medical History  Diagnosis Date  . Mental disorder   . Seizures ([place])   . Myocardial infarction ([place])   . Stroke ([place])   . Asthma   . Paranoid schizophrenia ([place])   . Gastric ulcer   . External hemorrhoids   . Hypothyroidism     hypo  . Insomnia   . Depression   . Renal disorder     renal failure  . Hydronephrosis     Medications:  Prescriptions prior to admission  Medication Sig Dispense Refill Last Dose  . albuterol-ipratropium (COMBIVENT) 18-103 MCG/ACT inhaler Inhale 2 puffs into the lungs every 6 (six) hours as needed for wheezing.   prn at prn  . diphenhydrAMINE (BENADRYL) 50 MG capsule Take 50 mg by mouth at bedtime.   prn at prn  . FLUoxetine (PROZAC) 40 MG capsule Take 40 mg by mouth daily.   05/05/2015 at 0800  . Fluticasone-Salmeterol (ADVAIR) 250-50 MCG/DOSE AEPB Inhale 1 puff into the lungs 2 (two) times daily.   05/05/2015 at 0800  . levothyroxine (SYNTHROID, LEVOTHROID) 50 MCG tablet Take 50 mcg by mouth daily before breakfast.   05/05/2015 at 0700  . risperiDONE (RISPERDAL) 0.5 MG tablet Take 0.5 mg by mouth at bedtime.   05/05/2015 at 2000  . risperiDONE (RISPERDAL) 3 MG tablet Take 3 mg by mouth at bedtime.   05/05/2015 at 2000  . sodium bicarbonate 650 MG tablet Take 2 tablets (1,300 mg total) by mouth 2 (two) times daily. (Patient taking differently: Take 650 mg by mouth 2 (two) times  daily. ) 20 tablet  05/06/2015 at 0800  . solifenacin (VESICARE) 5 MG tablet Take 5 mg by mouth daily.   05/05/2015 at 0800  . sulfamethoxazole-trimethoprim (BACTRIM,SEPTRA) 400-80 MG tablet Take 1 tablet by mouth 2 (two) times daily.   05/06/2015 at 0800  . tamsulosin (FLOMAX) 0.4 MG CAPS capsule Take 0.4 mg by mouth daily.   05/05/2015 at 2000   Assessment: 59 y/o M with sepsis from ESBL E coli and Pseudomonas growing in urine culture. Patient is on day 5 of meropenem.   Plan:  Continue meropenem 500 mg iv q 12 hours. Will continue to follow renal function.   Paulina Fusi, PharmD, BCPS 05/11/2015 10:42 AM

## 2015-05-11 NOTE — Discharge Summary (Signed)
[place] [facility] [place] NAME: Alan Small    MR#:  [id number]  DATE OF BIRTH:  [birth year]  DATE OF ADMISSION:  05/06/2015 ADMITTING PHYSICIAN: Henreitta Leber, MD  DATE OF DISCHARGE: 05/11/15  PRIMARY CARE PHYSICIAN: [name], MD    ADMISSION DIAGNOSIS:  Pyelonephritis [N12] Acute renal failure, unspecified acute renal failure type ([place]) [N17.9]  DISCHARGE DIAGNOSIS:  Active Problems:   Sepsis ([place])   Malnutrition of moderate degree   SECONDARY DIAGNOSIS:   Past Medical History  Diagnosis Date  . Mental disorder   . Seizures ([place])   . Myocardial infarction ([place])   . Stroke ([place])   . Asthma   . Paranoid schizophrenia ([place])   . Gastric ulcer   . External hemorrhoids   . Hypothyroidism     hypo  . Insomnia   . Depression   . Renal disorder     renal failure  . Hydronephrosis     HOSPITAL COURSE:   59 year old male with past medical history of paranoid schizophrenia, hypothyroidism, history of chronic indwelling Foley catheter, history of multidrug resistant UTI, depression who presents to the hospital due to abdominal pain and noted to have sepsis secondary to UTI.   #1 Sepsis with septic shock requiring pressors-the cultures growing ESBL Escherichia coli, urine cultures growing pseudomonas -off IV fluids and off levophed -has PICC line and on IV meropenem 500mg  IV BID until 05/17/15 - will need rehab for the same -ID consult appreciated  #2 Urinary tract infection-patient is high risk given his chronic indwelling Foley due to neurogenic bladder. - cultures growing pseudomonas - UTI related to his chronic indwelling foley -On IV meropenem- will need for 6 more days, PICC line in. -bladder spasms- on belladonna opioids suppositories  - outpatient urology f/u recommended and recommend discontinuing foley and intermittent catheterizations  #3 Acute renal failure on CKD-follows with nephrology at  [facility]. -Baseline GFR seems to be around 30, stage III CKD, baseline creatinine around 2.1. Creatinine and GFR Improving now and close to baseline now - urine output decreasing now- upto 4L over 24hrs now-likely post ATN diuresis -Appreciate nephrology consult -Avoid nephrotoxins.  - Patient is status post right nephrectomy for a nonfunctioning kidney.   #4 COPD-no acute exacerbation. -Continue home inhalers.  #5 Paranoid schizophrenia-continue risperidone  -On Prozac for depression   #6 Rectal prolapse- chronic, reducible - surgical consult appreciated - since chronic and reducible and patient doesn't want surgery- no further intervention  Uses a cane at baseline. Might need rehab for IV antibiotics Appreciate social worker and care management consults  DISCHARGE CONDITIONS:   Stable  CONSULTS OBTAINED:  Treatment Team:  Adrian Prows, MD Lavonia Dana, MD Florene Glen, MD  DRUG ALLERGIES:   Allergies  Allergen Reactions  . Mellaril [Thioridazine] Other (See Comments)    States sinuses stops/ swell up   . Morphine And Related Itching  . Navane [Thiothixene] Other (See Comments)    GI Distress  . Thorazine [Chlorpromazine] Other (See Comments)    Sensitive to sunlight. Sunburns easily    DISCHARGE MEDICATIONS:   Current Discharge Medication List    START taking these medications   Details  feeding supplement, ENSURE ENLIVE, (ENSURE ENLIVE) LIQD Take 237 mLs by mouth 2 (two) times daily with a meal. Qty: 237 mL, Refills: 12    hydrocortisone (ANUSOL-HC) 2.5 % rectal cream Place rectally 2 (two) times daily. Qty: 30 g, Refills: 0  meropenem 500 mg in sodium chloride 0.9 % 50 mL Inject 500 mg into the vein every 12 (twelve) hours. Qty: 6 g, Refills: 0    opium-belladonna (B&O SUPPRETTES) 16.2-60 MG suppository Place 1 suppository rectally every 6 (six) hours as needed for bladder spasms. Qty: 12 suppository, Refills: 0      CONTINUE these  medications which have CHANGED   Details  risperiDONE (RISPERDAL) 3 MG tablet Take 1 tablet (3 mg total) by mouth at bedtime. Qty: 20 tablet, Refills: 0    sodium bicarbonate 650 MG tablet Take 1 tablet (650 mg total) by mouth 2 (two) times daily. Qty: 20 tablet      CONTINUE these medications which have NOT CHANGED   Details  albuterol-ipratropium (COMBIVENT) 18-103 MCG/ACT inhaler Inhale 2 puffs into the lungs every 6 (six) hours as needed for wheezing.    FLUoxetine (PROZAC) 40 MG capsule Take 40 mg by mouth daily.    Fluticasone-Salmeterol (ADVAIR) 250-50 MCG/DOSE AEPB Inhale 1 puff into the lungs 2 (two) times daily.    levothyroxine (SYNTHROID, LEVOTHROID) 50 MCG tablet Take 50 mcg by mouth daily before breakfast.    solifenacin (VESICARE) 5 MG tablet Take 5 mg by mouth daily.    tamsulosin (FLOMAX) 0.4 MG CAPS capsule Take 0.4 mg by mouth daily.      STOP taking these medications     diphenhydrAMINE (BENADRYL) 50 MG capsule      sulfamethoxazole-trimethoprim (BACTRIM,SEPTRA) 400-80 MG tablet          DISCHARGE INSTRUCTIONS:   1. PCP f/u in 1 week 2. Urology f/u in 1-2 weeks to see if chronic foley can be discontinued and patient can go back on intermittent self catheterizations 3. PICC line can be taken out once the antibiotics are finsihed  If you experience worsening of your admission symptoms, develop shortness of breath, life threatening emergency, suicidal or homicidal thoughts you must seek medical attention immediately by calling 911 or calling your MD immediately  if symptoms less severe.  You Must read complete instructions/literature along with all the possible adverse reactions/side effects for all the Medicines you take and that have been prescribed to you. Take any new Medicines after you have completely understood and accept all the possible adverse reactions/side effects.   Please note  You were cared for by a hospitalist during your hospital stay.  If you have any questions about your discharge medications or the care you received while you were in the hospital after you are discharged, you can call the unit and asked to speak with the hospitalist on call if the hospitalist that took care of you is not available. Once you are discharged, your primary care physician will handle any further medical issues. Please note that NO REFILLS for any discharge medications will be authorized once you are discharged, as it is imperative that you return to your primary care physician (or establish a relationship with a primary care physician if you do not have one) for your aftercare needs so that they can reassess your need for medications and monitor your lab values.    Today   CHIEF COMPLAINT:   Chief Complaint  Patient presents with  . Abdominal Pain    VITAL SIGNS:  Blood pressure 118/75, pulse 101, temperature 97.5 F (36.4 C), temperature source Oral, resp. rate 18, height 6' (1.829 m), weight 74.8 kg (164 lb 14.5 oz), SpO2 95 %.  I/O:   Intake/Output Summary (Last 24 hours) at 05/11/15 1240 Last data  filed at 05/11/15 1220  Gross per 24 hour  Intake      0 ml  Output   5400 ml  Net  -5400 ml    PHYSICAL EXAMINATION:   Physical Exam  GENERAL: 59 y.o.-year-old patient lying in the bed with no acute distress. disshelved. EYES: Pupils equal, round, reactive to light and accommodation. No scleral icterus. Extraocular muscles intact.  HEENT: Head atraumatic, normocephalic. Oropharynx and nasopharynx clear.  NECK: Supple, no jugular venous distention. No thyroid enlargement, no tenderness.  LUNGS: Normal breath sounds bilaterally, no wheezing, rales,rhonchi or crepitation. No use of accessory muscles of respiration.  CARDIOVASCULAR: S1, S2 normal. No murmurs, rubs, or gallops.  ABDOMEN: Soft, nontender, nondistended. Bowel sounds present. No organomegaly or mass. Some suprapubic discomfort on palpation noted. Has a chronic  indwelling Foley catheter. Catheter last changed on 05/07/15-now no bleeding around urethral orifice  EXTREMITIES: No pedal edema, cyanosis, or clubbing.  NEUROLOGIC: Cranial nerves II through XII are intact. Muscle strength 5/5 in all extremities. Sensation intact. Gait not checked.  PSYCHIATRIC: The patient is alert and oriented x 3.  SKIN: No obvious rash, lesion, or ulcer.   DATA REVIEW:   CBC  Recent Labs Lab 05/11/15 0414  WBC 16.7*  HGB 12.2*  HCT 36.4*  PLT 99*    Chemistries   Recent Labs Lab 05/06/15 1057  05/11/15 0414  NA 142  < > 141  K 4.5  < > 4.8  CL 109  < > 112*  CO2 18*  < > 24  GLUCOSE 112*  < > 89  BUN 34*  < > 37*  CREATININE 2.54*  < > 1.88*  CALCIUM 9.7  < > 9.0  AST 26  --   --   ALT 20  --   --   ALKPHOS 106  --   --   BILITOT 0.4  --   --   < > = values in this interval not displayed.  Cardiac Enzymes No results for input(s): TROPONINI in the last 168 hours.  Microbiology Results  Results for orders placed or performed during the hospital encounter of 05/06/15  Urine culture     Status: None   Collection Time: 05/06/15 11:12 AM  Result Value Ref Range Status   Specimen Description URINE, RANDOM  Final   Special Requests NONE  Final   Culture   Final    >=100,000 COLONIES/mL ESCHERICHIA COLI >=100,000 COLONIES/mL PSEUDOMONAS AERUGINOSA Results Called to: KRISTA YOUNG AT 1245 05/10/15 CTJ ESBL-EXTENDED SPECTRUM BETA LACTAMASE-THE ORGANISM IS RESISTANT TO PENICILLINS, CEPHALOSPORINS AND AZTREONAM ACCORDING TO CLSI M100-S15 VOL.[place].    Report Status 05/10/2015 FINAL  Final   Organism ID, Bacteria PSEUDOMONAS AERUGINOSA  Final   Organism ID, Bacteria ESCHERICHIA COLI  Final      Susceptibility   Escherichia coli - MIC*    AMPICILLIN >=32 RESISTANT Resistant     CEFTAZIDIME 16 RESISTANT Resistant     CEFAZOLIN >=64 RESISTANT Resistant     CEFTRIAXONE >=64 RESISTANT Resistant     CIPROFLOXACIN >=4 RESISTANT Resistant      GENTAMICIN <=1 SENSITIVE Sensitive     IMIPENEM <=0.25 SENSITIVE Sensitive     TRIMETH/SULFA >=320 RESISTANT Resistant     Extended ESBL POSITIVE Resistant     NITROFURANTOIN Value in next row Sensitive      SENSITIVE<=16    PIP/TAZO Value in next row Intermediate      INTERMEDIATE64    * >=100,000 COLONIES/mL ESCHERICHIA COLI  Pseudomonas aeruginosa - MIC*    CIPROFLOXACIN Value in next row Intermediate      INTERMEDIATE2    GENTAMICIN Value in next row Intermediate      INTERMEDIATE8    IMIPENEM Value in next row Sensitive      SENSITIVE2    PIP/TAZO Value in next row Sensitive      SENSITIVE32    AMPICILLIN Value in next row Resistant      RESISTANT>=32    CEFAZOLIN Value in next row Resistant      RESISTANT>=64    CEFTRIAXONE Value in next row Resistant      RESISTANT>=64    LEVOFLOXACIN Value in next row Resistant      RESISTANT>=8    NITROFURANTOIN Value in next row Resistant      RESISTANT>=512    TRIMETH/SULFA Value in next row Resistant      RESISTANT>=320    * >=100,000 COLONIES/mL PSEUDOMONAS AERUGINOSA  Culture, blood (routine x 2)     Status: None   Collection Time: 05/06/15  1:08 PM  Result Value Ref Range Status   Specimen Description BLOOD RIGHT AC  Final   Special Requests BOTTLES DRAWN AEROBIC AND ANAEROBIC  3CC  Final   Culture  Setup Time   Final    GRAM NEGATIVE RODS IN BOTH AEROBIC AND ANAEROBIC BOTTLES CRITICAL RESULT CALLED TO, READ BACK BY AND VERIFIED WITH: CHAROLOTTE KYEI AT 2342 05/06/15.PMH CONFIRMED BY WM    Culture   Final    ESCHERICHIA COLI IN BOTH AEROBIC AND ANAEROBIC BOTTLES Results Called to: [facility] TRAVIS AT 0740 05/08/15 DV ESBL-EXTENDED SPECTRUM BETA LACTAMASE-THE ORGANISM IS RESISTANT TO PENICILLINS, CEPHALOSPORINS AND AZTREONAM ACCORDING TO CLSI M100-S15 VOL.[place].    Report Status 05/08/2015 FINAL  Final   Organism ID, Bacteria ESCHERICHIA COLI  Final      Susceptibility   Escherichia coli - MIC*     AMPICILLIN >=32 RESISTANT Resistant     CEFTAZIDIME 16 RESISTANT Resistant     CEFAZOLIN >=64 RESISTANT Resistant     CEFTRIAXONE >=64 RESISTANT Resistant     CIPROFLOXACIN >=4 RESISTANT Resistant     GENTAMICIN <=1 SENSITIVE Sensitive     IMIPENEM <=0.25 SENSITIVE Sensitive     TRIMETH/SULFA >=320 RESISTANT Resistant     Extended ESBL POSITIVE Resistant     PIP/TAZO Value in next row Intermediate      INTERMEDIATE64    * ESCHERICHIA COLI  Culture, blood (routine x 2)     Status: None   Collection Time: 05/06/15  1:13 PM  Result Value Ref Range Status   Specimen Description BLOOD LEFT AC  Final   Special Requests BOTTLES DRAWN AEROBIC AND ANAEROBIC  1CC  Final   Culture  Setup Time   Final    GRAM NEGATIVE RODS IN BOTH AEROBIC AND ANAEROBIC BOTTLES CRITICAL RESULT CALLED TO, READ BACK BY AND VERIFIED WITH: CHARLOTTE KYEI AT 2146 05/06/15.PMH CONFIRMED BY CAF    Culture   Final    ESCHERICHIA COLI IN BOTH AEROBIC AND ANAEROBIC BOTTLES Results Called to: [facility] TRAVIS AT 0740 05/08/15 DV ESBL-EXTENDED SPECTRUM BETA LACTAMASE-THE ORGANISM IS RESISTANT TO PENICILLINS, CEPHALOSPORINS AND AZTREONAM ACCORDING TO CLSI M100-S15 VOL.[place].    Report Status 05/08/2015 FINAL  Final   Organism ID, Bacteria ESCHERICHIA COLI  Final      Susceptibility   Escherichia coli - MIC*    AMPICILLIN >=32 RESISTANT Resistant     CEFTAZIDIME 16 RESISTANT Resistant  CEFAZOLIN >=64 RESISTANT Resistant     CEFTRIAXONE >=64 RESISTANT Resistant     CIPROFLOXACIN >=4 RESISTANT Resistant     GENTAMICIN <=1 SENSITIVE Sensitive     IMIPENEM <=0.25 SENSITIVE Sensitive     TRIMETH/SULFA >=320 RESISTANT Resistant     Extended ESBL POSITIVE Resistant     PIP/TAZO Value in next row Intermediate      INTERMEDIATE64    * ESCHERICHIA COLI  MRSA PCR Screening     Status: None   Collection Time: 05/07/15  7:14 AM  Result Value Ref Range Status   MRSA by PCR NEGATIVE NEGATIVE Final    Comment:         The GeneXpert MRSA Assay (FDA approved for NASAL specimens only), is one component of a comprehensive MRSA colonization surveillance program. It is not intended to diagnose MRSA infection nor to guide or monitor treatment for MRSA infections.     RADIOLOGY:  No results found.  EKG:   Orders placed or performed during the hospital encounter of 02/22/15  . EKG      Management plans discussed with the patient, family and they are in agreement.  CODE STATUS:     Code Status Orders        Start     Ordered   05/06/15 1857  Full code   Continuous     05/06/15 1856      TOTAL TIME TAKING CARE OF THIS PATIENT: 37 minutes.    Rether Rison M.D on 05/11/2015 at 12:40 PM  Between 7am to 6pm - Pager - [phone]  After 6pm go to [email or web address] - password [email or web address] [facility]  Office  [phone]  CC: Primary care physician; [name], MD

## 2015-05-11 NOTE — NC FL2 (Signed)
[place] LEVEL OF CARE SCREENING TOOL     IDENTIFICATION  Patient Name: Alan Small Birthdate: 01/30/1956 Sex: male Admission Date (Current Location): 05/06/2015  [facility] and [place] Number: [place]   ([id number]) Facility and Address:  [facility], [address], [place], [place] [address]      Provider Number: [id number]  Attending Physician Name and Address:  Gladstone Lighter, MD  Relative Name and Phone Number:       Current Level of Care: Hospital Recommended Level of Care: [place] Prior Approval Number:    Date Approved/Denied:   PASRR Number:    Discharge Plan: Other (Comment) ([place])    Current Diagnoses: Patient Active Problem List   Diagnosis Date Noted  . Malnutrition of moderate degree 05/09/2015  . Sepsis ([place]) 05/06/2015  . UTI (lower urinary tract infection) 01/17/2015  . Hypoglycemia 01/17/2015  . Metabolic acidosis 0000000  . Acute renal failure ([place]) 11/03/2012  . Hyperkalemia 11/03/2012  . Pyelonephritis 11/03/2012  . Schizophrenia ([place]) 11/03/2012  . Hypothyroidism 11/03/2012  . Severe sepsis ([place]) 11/03/2012  . Protein-calorie malnutrition, severe ([place]) 11/03/2012  . Encephalopathy acute 11/03/2012    Orientation RESPIRATION BLADDER Height & Weight    Self, Time, Situation, Place  Normal Continent 6' (182.9 cm) 164 lbs.  BEHAVIORAL SYMPTOMS/MOOD NEUROLOGICAL BOWEL NUTRITION STATUS     (History) Continent Diet  AMBULATORY STATUS COMMUNICATION OF NEEDS Skin   Supervision Verbally Normal                       Personal Care Assistance Level of Assistance  Bathing, Feeding, Dressing Bathing Assistance: Independent Feeding assistance: Independent Dressing Assistance: Independent     Functional Limitations Info  Sight, Hearing, Speech Sight Info: Adequate Hearing Info: Adequate Speech Info: Adequate    SPECIAL CARE FACTORS FREQUENCY                        Contractures      Additional Factors Info  Code Status, Allergies Code Status Info: Full Code Allergies Info: Allergies: Mellaril, Morphine And Related, Navane, Thorazine           Current Medications (05/11/2015):  This is the current hospital active medication list Current Facility-Administered Medications  Medication Dose Route Frequency Provider Last Rate Last Dose  . acetaminophen (TYLENOL) tablet 650 mg  650 mg Oral Q6H PRN Henreitta Leber, MD   650 mg at 05/11/15 1224   Or  . acetaminophen (TYLENOL) suppository 650 mg  650 mg Rectal Q6H PRN Henreitta Leber, MD      . darifenacin (ENABLEX) 24 hr tablet 7.5 mg  7.5 mg Oral Daily Henreitta Leber, MD   7.5 mg at 05/11/15 0913  . feeding supplement (ENSURE ENLIVE) (ENSURE ENLIVE) liquid 237 mL  237 mL Oral BID WC Gladstone Lighter, MD   237 mL at 05/11/15 0849  . FLUoxetine (PROZAC) capsule 40 mg  40 mg Oral Daily Henreitta Leber, MD   40 mg at 05/11/15 0913  . hydrocortisone (ANUSOL-HC) 2.5 % rectal cream   Rectal BID Florene Glen, MD      . HYDROmorphone (DILAUDID) injection 1 mg  1 mg Intravenous Q4H PRN Henreitta Leber, MD   1 mg at 05/07/15 0334  . levothyroxine (SYNTHROID, LEVOTHROID) tablet 50 mcg  50 mcg Oral QAC breakfast Henreitta Leber, MD   50 mcg at 05/11/15 0848  .  meropenem (MERREM) 500 mg in sodium chloride 0.9 % 50 mL IVPB  500 mg Intravenous Q12H Adrian Prows, MD   500 mg at 05/11/15 0915  . mometasone-formoterol (DULERA) 100-5 MCG/ACT inhaler 2 puff  2 puff Inhalation BID Henreitta Leber, MD   2 puff at 05/11/15 0849  . ondansetron (ZOFRAN) tablet 4 mg  4 mg Oral Q6H PRN Henreitta Leber, MD       Or  . ondansetron (ZOFRAN) injection 4 mg  4 mg Intravenous Q6H PRN Henreitta Leber, MD      . opium-belladonna (B&O SUPPRETTES) 16.2-60 MG suppository 1 suppository  1 suppository Rectal Q6H PRN Gladstone Lighter, MD   1 suppository at 05/07/15 1306  . risperiDONE (RISPERDAL) tablet 3 mg  3 mg  Oral QHS Henreitta Leber, MD   3 mg at 05/10/15 2142  . sodium chloride 0.9 % bolus 1,000 mL  1,000 mL Intravenous PRN Harrie Foreman, MD      . sodium chloride 0.9 % injection 10-40 mL  10-40 mL Intracatheter Q12H Laverle Hobby, MD   10 mL at 05/10/15 2200  . sodium chloride 0.9 % injection 10-40 mL  10-40 mL Intracatheter PRN Laverle Hobby, MD      . tamsulosin (FLOMAX) capsule 0.4 mg  0.4 mg Oral Daily Henreitta Leber, MD   0.4 mg at 05/11/15 [id number]     Discharge Medications: Please see discharge summary for a list of discharge medications.  Relevant Imaging Results:  Relevant Lab Results:   Additional Information Pt is on contact isolation and will need IV ABX  Darden Dates, LCSW

## 2015-05-11 NOTE — Progress Notes (Signed)
[place] at [place] NAME: Alan Small    MR#:  [id number]  DATE OF BIRTH:  1955/08/02  SUBJECTIVE:  CHIEF COMPLAINT:   Chief Complaint  Patient presents with  . Abdominal Pain   - urine output decreasing slowly- 4L over 24hrs now - likely post ATN diuresis - will need to be discharged on IV meropenem for 6 more days.  REVIEW OF SYSTEMS:  Review of Systems  Constitutional: Negative for fever and chills.  Respiratory: Negative for cough, shortness of breath and wheezing.   Cardiovascular: Negative for chest pain and palpitations.  Gastrointestinal: Negative for nausea, vomiting, abdominal pain, diarrhea and constipation.  Genitourinary: Negative for dysuria.  Neurological: Negative for dizziness, seizures and headaches.    DRUG ALLERGIES:   Allergies  Allergen Reactions  . Mellaril [Thioridazine] Other (See Comments)    States sinuses stops/ swell up   . Morphine And Related Itching  . Navane [Thiothixene] Other (See Comments)    GI Distress  . Thorazine [Chlorpromazine] Other (See Comments)    Sensitive to sunlight. Sunburns easily    VITALS:  Blood pressure 118/75, pulse 101, temperature 97.5 F (36.4 C), temperature source Oral, resp. rate 18, height 6' (1.829 m), weight 74.8 kg (164 lb 14.5 oz), SpO2 95 %.  PHYSICAL EXAMINATION:  Physical Exam  GENERAL:  59 y.o.-year-old patient lying in the bed with no acute distress. disshelved. EYES: Pupils equal, round, reactive to light and accommodation. No scleral icterus. Extraocular muscles intact.  HEENT: Head atraumatic, normocephalic. Oropharynx and nasopharynx clear.  NECK:  Supple, no jugular venous distention. No thyroid enlargement, no tenderness.  LUNGS: Normal breath sounds bilaterally, no wheezing, rales,rhonchi or crepitation. No use of accessory muscles of respiration.  CARDIOVASCULAR: S1, S2 normal. No murmurs, rubs, or gallops.  ABDOMEN: Soft, nontender,  nondistended. Bowel sounds present. No organomegaly or mass. Some suprapubic discomfort on palpation noted. Has a chronic indwelling Foley catheter. Catheter last changed on 05/07/15-now no bleeding around urethral orifice  EXTREMITIES: No pedal edema, cyanosis, or clubbing.  NEUROLOGIC: Cranial nerves II through XII are intact. Muscle strength 5/5 in all extremities. Sensation intact. Gait not checked.  PSYCHIATRIC: The patient is alert and oriented x 3.  SKIN: No obvious rash, lesion, or ulcer.    LABORATORY PANEL:   CBC  Recent Labs Lab 05/11/15 0414  WBC 16.7*  HGB 12.2*  HCT 36.4*  PLT 99*   ------------------------------------------------------------------------------------------------------------------  Chemistries   Recent Labs Lab 05/06/15 1057  05/11/15 0414  NA 142  < > 141  K 4.5  < > 4.8  CL 109  < > 112*  CO2 18*  < > 24  GLUCOSE 112*  < > 89  BUN 34*  < > 37*  CREATININE 2.54*  < > 1.88*  CALCIUM 9.7  < > 9.0  AST 26  --   --   ALT 20  --   --   ALKPHOS 106  --   --   BILITOT 0.4  --   --   < > = values in this interval not displayed. ------------------------------------------------------------------------------------------------------------------  Cardiac Enzymes No results for input(s): TROPONINI in the last 168 hours. ------------------------------------------------------------------------------------------------------------------  RADIOLOGY:  No results found.  EKG:   Orders placed or performed during the hospital encounter of 02/22/15  . EKG    ASSESSMENT AND PLAN:   59 year old male with past medical history of paranoid schizophrenia, hypothyroidism, history of chronic indwelling Foley catheter, history of  multidrug resistant UTI, depression who presents to the hospital due to abdominal pain and noted to have sepsis secondary to UTI.   #1 Sepsis with septic shock requiring pressors-the cultures growing ESBL Escherichia coli, urine  cultures growing pseudomonas -off IV fluids and off levophed -has PICC line and on IV meropenem 500mg  IV BID until 05/17/15 - will need rehab for the same -ID consult appreciated  #2 Urinary tract infection-patient is high risk given his chronic indwelling Foley due to neurogenic bladder. - cultures growing pseudomonas - UTI related to his chronic indwelling foley -On IV meropenem- will need for 6 more days, PICC line in. -bladder spasms- on belladonna opioids suppositories  - outpatient urology f/u recommended and recommend discontinuing foley and intermittent catheterizations   #3 Acute renal failure on CKD-follows with nephrology at [facility]. -Baseline GFR seems to be around 30, stage III CKD, baseline creatinine around 2.1. Creatinine and GFR Improving now and close to baseline now - urine output decreasing now- upto 4L over 24hrs now-likely post ATN diuresis -Appreciate nephrology consult -Avoid nephrotoxins.  - Patient is status post right nephrectomy for a nonfunctioning kidney.   #4 COPD-no acute exacerbation. -Continue home inhalers.  #5 Paranoid schizophrenia-continue risperidone  -On Prozac for depression    #6 DVT prophylaxis-on subcutaneous heparin   #7 Rectal prolapse- chronic, reducible - surgical consult appreciated - since chronic and reducible and patient doesn't want surgery- no further intervention  Uses a cane at baseline. Might need rehab for IV antibiotics Appreciate social worker and care management consults  All the records are reviewed and case discussed with Care Management/Social Workerr. Management plans discussed with the patient, family and they are in agreement.  CODE STATUS: Full Code  TOTAL  TIME SPENT IN TAKING CARE OF THIS PATIENT: 38 minutes.   POSSIBLE D/C IN 1-2 DAYS, DEPENDING ON CLINICAL CONDITION and whenever bed available.   Alan Small M.D on 05/11/2015 at 11:30 AM  Between 7am to 6pm - Pager - [phone]  After  6pm go to [email or web address] - password [email or web address] [place] [facility]  Office  [phone]  CC: Primary care physician; [name], MD

## 2015-05-12 MED ORDER — HEPARIN SODIUM (PORCINE) 5000 UNIT/ML IJ SOLN
5000.0000 [IU] | Freq: Three times a day (TID) | INTRAMUSCULAR | Status: DC
  Administered 2015-05-12 – 2015-05-14 (×7): 5000 [IU] via SUBCUTANEOUS
  Filled 2015-05-12 (×6): qty 1

## 2015-05-12 NOTE — Plan of Care (Signed)
Problem: Education: Goal: Knowledge of [facility] Education information/materials will improve Outcome: Progressing Plan of care reviewed with pt. Educated pt about high fall risk precautions. Pt verbalized understanding and uses the call bell.  Problem: Health Behavior/Discharge Planning: Goal: Ability to manage health-related needs will improve Outcome: Progressing Pt compliant with care. Per social worker note from 05/11/2015 plan to be discharge to SNF. Awaiting PASARR.  Problem: Physical Regulation: Goal: Will remain free from infection Outcome: Progressing Educated pt about proper hand washing and foley care. Pt verbalized understanding.  Contact precautions maintained.  Problem: Tissue Perfusion: Goal: Risk factors for ineffective tissue perfusion will decrease Outcome: Progressing Pt is able to turn self. Skin intact. Teds, SCD's and heparin continues.  Problem: Fluid Volume: Goal: Hemodynamic stability will improve Outcome: Progressing BP/HR stable. Encouraged PO fluids. Chronic foley, Intact and draining yellow urine.  Problem: Physical Regulation: Goal: Signs and symptoms of infection will decrease Outcome: Progressing Last WBC's 16.7 05/11/15. Afebrile. ABX continues.

## 2015-05-12 NOTE — Progress Notes (Signed)
[place] at [place] NAME: Alan Small    MR#:  [id number]  DATE OF BIRTH:  10-Sep-1955  SUBJECTIVE:  CHIEF COMPLAINT:   Chief Complaint  Patient presents with  . Abdominal Pain   - urine output up to 6 L again yesterday. Discharged consult as patient will need to rehabilitation for IV antibiotics administration. PASSR pending. He denies any complaints. -Requesting to go out and smoke.  REVIEW OF SYSTEMS:  Review of Systems  Constitutional: Negative for fever and chills.  Respiratory: Negative for cough, shortness of breath and wheezing.   Cardiovascular: Negative for chest pain and palpitations.  Gastrointestinal: Negative for nausea, vomiting, abdominal pain, diarrhea and constipation.  Genitourinary: Negative for dysuria.  Neurological: Negative for dizziness, seizures and headaches.    DRUG ALLERGIES:   Allergies  Allergen Reactions  . Mellaril [Thioridazine] Other (See Comments)    States sinuses stops/ swell up   . Morphine And Related Itching  . Navane [Thiothixene] Other (See Comments)    GI Distress  . Thorazine [Chlorpromazine] Other (See Comments)    Sensitive to sunlight. Sunburns easily    VITALS:  Blood pressure 116/79, pulse 86, temperature 98.8 F (37.1 C), temperature source Oral, resp. rate 18, height 6' (1.829 m), weight 74.8 kg (164 lb 14.5 oz), SpO2 95 %.  PHYSICAL EXAMINATION:  Physical Exam  GENERAL:  59 y.o.-year-old patient lying in the bed with no acute distress. disshelved. EYES: Pupils equal, round, reactive to light and accommodation. No scleral icterus. Extraocular muscles intact.  HEENT: Head atraumatic, normocephalic. Oropharynx and nasopharynx clear.  NECK:  Supple, no jugular venous distention. No thyroid enlargement, no tenderness.  LUNGS: Normal breath sounds bilaterally, no wheezing, rales,rhonchi or crepitation. No use of accessory muscles of respiration.  CARDIOVASCULAR: S1, S2  normal. No murmurs, rubs, or gallops.  ABDOMEN: Soft, nontender, nondistended. Bowel sounds present. No organomegaly or mass. Some suprapubic discomfort on palpation noted. Has a chronic indwelling Foley catheter. Catheter last changed on 05/07/15-now no bleeding around urethral orifice  EXTREMITIES: No pedal edema, cyanosis, or clubbing.  NEUROLOGIC: Cranial nerves II through XII are intact. Muscle strength 5/5 in all extremities. Sensation intact. Gait not checked.  PSYCHIATRIC: The patient is alert and oriented x 3.  SKIN: No obvious rash, lesion, or ulcer.    LABORATORY PANEL:   CBC  Recent Labs Lab 05/11/15 0414  WBC 16.7*  HGB 12.2*  HCT 36.4*  PLT 99*   ------------------------------------------------------------------------------------------------------------------  Chemistries   Recent Labs Lab 05/06/15 1057  05/11/15 0414  NA 142  < > 141  K 4.5  < > 4.8  CL 109  < > 112*  CO2 18*  < > 24  GLUCOSE 112*  < > 89  BUN 34*  < > 37*  CREATININE 2.54*  < > 1.88*  CALCIUM 9.7  < > 9.0  AST 26  --   --   ALT 20  --   --   ALKPHOS 106  --   --   BILITOT 0.4  --   --   < > = values in this interval not displayed. ------------------------------------------------------------------------------------------------------------------  Cardiac Enzymes No results for input(s): TROPONINI in the last 168 hours. ------------------------------------------------------------------------------------------------------------------  RADIOLOGY:  No results found.  EKG:   Orders placed or performed during the hospital encounter of 02/22/15  . EKG    ASSESSMENT AND PLAN:   59 year old male with past medical history of paranoid schizophrenia, hypothyroidism, history of  chronic indwelling Foley catheter, history of multidrug resistant UTI, depression who presents to the hospital due to abdominal pain and noted to have sepsis secondary to UTI.   #1 Sepsis with septic shock requiring  pressors-the cultures growing ESBL Escherichia coli, urine cultures growing pseudomonas -off IV fluids and off levophed -has PICC line and on IV meropenem 500mg  IV BID until 05/17/15 - will need rehab for the same -ID consult appreciated  #2 Urinary tract infection-patient is high risk given his chronic indwelling Foley due to neurogenic bladder. - cultures growing pseudomonas - UTI related to his chronic indwelling foley -On IV meropenem- until 05/17/15, PICC line in. -bladder spasms- on belladonna opioids suppositories  - outpatient urology f/u recommended and recommend discontinuing foley and starting intermittent catheterizations   #3 Acute renal failure on CKD-follows with nephrology at [facility]. -Baseline GFR seems to be around 30, stage III CKD, baseline creatinine around 2.1. Creatinine and GFR Improving now and close to baseline now - urine output today at 6L in  24hrs now-likely post ATN diuresis, normal osm -Appreciate nephrology consult. Check BMP for electrolytes tomorrow -Avoid nephrotoxins.  - Patient is status post right nephrectomy for a nonfunctioning kidney.   #4 COPD-no acute exacerbation. -Continue home inhalers.  #5 Paranoid schizophrenia-continue risperidone  -On Prozac for depression    #6 DVT prophylaxis-on subcutaneous heparin   #7 Rectal prolapse- chronic, reducible - surgical consult appreciated - since chronic and reducible and patient doesn't want surgery- no further intervention  #8 Tobacco use disorder- refused nicotine patch.  Uses a cane at baseline. Might need rehab for IV antibiotics Appreciate social worker and care management consults  All the records are reviewed and case discussed with Care Management/Social Workerr. Management plans discussed with the patient, family and they are in agreement.  CODE STATUS: Full Code  TOTAL  TIME SPENT IN TAKING CARE OF THIS PATIENT: 36 minutes.   POSSIBLE D/C IN 1-2 DAYS, DEPENDING ON CLINICAL  CONDITION and whenever bed available.   Athalee Esterline M.D on 05/12/2015 at 10:38 AM  Between 7am to 6pm - Pager - [phone]  After 6pm go to [email or web address] - password [email or web address] [place] [facility]  Office  [phone]  CC: Primary care physician; [name], MD

## 2015-05-12 NOTE — Progress Notes (Signed)
   05/12/15 1500  Clinical Encounter Type  Visited With Patient  Visit Type Initial  Referral From Nurse  Consult/Referral To Chaplain  Spiritual Encounters  Spiritual Needs Sacred text  Patient requested a Bible. We spent time talking about many topics. He seemed to be in good spirits. [place]

## 2015-05-12 NOTE — Progress Notes (Signed)
[facility]  ROUNDING NOTE   Subjective:   Creatinine 1.88 (2.02) (2.65) (3.86) UOP 6150 (4050) (9400) (10750)  Overall doing well ate a good breakfast and lunch  Objective:  Vital signs in last 24 hours:  Temp:  [98.1 F (36.7 C)-98.8 F (37.1 C)] 98.8 F (37.1 C) (12/10 0451) Pulse Rate:  [84-90] 86 (12/10 0744) Resp:  [18] 18 (12/10 0744) BP: (109-128)/(70-79) 116/79 mmHg (12/10 0744) SpO2:  [93 %-96 %] 95 % (12/10 0744)  Weight change:  Filed Weights   05/06/15 1054 05/06/15 1853 05/07/15 0700  Weight: 70.761 kg (156 lb) 71.668 kg (158 lb) 74.8 kg (164 lb 14.5 oz)    Intake/Output: I/O last 3 completed shifts: In: 7 [IV Piggyback:50] Out: 7900 [Urine:7900]   Intake/Output this shift:     Physical Exam: General: NAD, sitting up in bed.   Head: +temporal wasting. Moist oral mucosal membranes  Eyes: Anicteric, PERRL  Neck: Supple, trachea midline  Lungs:  Clear to auscultation  Heart: Regular rate and rhythm  Abdomen:  Soft, nontender,   Extremities:  no peripheral edema.  Neurologic: Nonfocal, moving all four extremities  Skin: No lesions       Basic Metabolic Panel:  Recent Labs Lab 05/07/15 0420 05/08/15 0445 05/09/15 0420 05/10/15 0530 05/11/15 0414  NA 135 130* 138 143 141  K 4.2 4.3 4.1 4.5 4.8  CL 111 107 112* 111 112*  CO2 17* 16* 20* 27 24  GLUCOSE 105* 83 117* 97 89  BUN 50* 54* 46* 32* 37*  CREATININE 4.50* 3.86* 2.65* 2.02* 1.88*  CALCIUM 8.0* 7.3* 8.1* 8.6* 9.0    Liver Function Tests:  Recent Labs Lab 05/06/15 1057  AST 26  ALT 20  ALKPHOS 106  BILITOT 0.4  PROT 7.2  ALBUMIN 3.8    Recent Labs Lab 05/06/15 1057  LIPASE 30   No results for input(s): AMMONIA in the last 168 hours.  CBC:  Recent Labs Lab 05/07/15 0420 05/08/15 0445 05/09/15 0420 05/10/15 0530 05/11/15 0414  WBC 40.6* 45.5* 35.2* 22.1* 16.7*  HGB 11.8* 10.5* 11.0* 11.7* 12.2*  HCT 35.6* 32.4* 32.7* 35.1* 36.4*  MCV 92.7 91.7  90.8 90.9 90.4  PLT 118* 97* 90* 107* 99*    Cardiac Enzymes: No results for input(s): CKTOTAL, CKMB, CKMBINDEX, TROPONINI in the last 168 hours.  BNP: Invalid input(s): POCBNP  CBG: No results for input(s): GLUCAP in the last 168 hours.  Microbiology: Results for orders placed or performed during the hospital encounter of 05/06/15  Urine culture     Status: None   Collection Time: 05/06/15 11:12 AM  Result Value Ref Range Status   Specimen Description URINE, RANDOM  Final   Special Requests NONE  Final   Culture   Final    >=100,000 COLONIES/mL ESCHERICHIA COLI >=100,000 COLONIES/mL PSEUDOMONAS AERUGINOSA Results Called to: KRISTA YOUNG AT 1245 05/10/15 CTJ ESBL-EXTENDED SPECTRUM BETA LACTAMASE-THE ORGANISM IS RESISTANT TO PENICILLINS, CEPHALOSPORINS AND AZTREONAM ACCORDING TO CLSI M100-S15 VOL.[place].    Report Status 05/10/2015 FINAL  Final   Organism ID, Bacteria PSEUDOMONAS AERUGINOSA  Final   Organism ID, Bacteria ESCHERICHIA COLI  Final      Susceptibility   Escherichia coli - MIC*    AMPICILLIN >=32 RESISTANT Resistant     CEFTAZIDIME 16 RESISTANT Resistant     CEFAZOLIN >=64 RESISTANT Resistant     CEFTRIAXONE >=64 RESISTANT Resistant     CIPROFLOXACIN >=4 RESISTANT Resistant     GENTAMICIN <=1  SENSITIVE Sensitive     IMIPENEM <=0.25 SENSITIVE Sensitive     TRIMETH/SULFA >=320 RESISTANT Resistant     Extended ESBL POSITIVE Resistant     NITROFURANTOIN Value in next row Sensitive      SENSITIVE<=16    PIP/TAZO Value in next row Intermediate      INTERMEDIATE64    * >=100,000 COLONIES/mL ESCHERICHIA COLI   Pseudomonas aeruginosa - MIC*    CIPROFLOXACIN Value in next row Intermediate      INTERMEDIATE2    GENTAMICIN Value in next row Intermediate      INTERMEDIATE8    IMIPENEM Value in next row Sensitive      SENSITIVE2    PIP/TAZO Value in next row Sensitive      SENSITIVE32    AMPICILLIN Value in next row Resistant      RESISTANT>=32     CEFAZOLIN Value in next row Resistant      RESISTANT>=64    CEFTRIAXONE Value in next row Resistant      RESISTANT>=64    LEVOFLOXACIN Value in next row Resistant      RESISTANT>=8    NITROFURANTOIN Value in next row Resistant      RESISTANT>=512    TRIMETH/SULFA Value in next row Resistant      RESISTANT>=320    * >=100,000 COLONIES/mL PSEUDOMONAS AERUGINOSA  Culture, blood (routine x 2)     Status: None   Collection Time: 05/06/15  1:08 PM  Result Value Ref Range Status   Specimen Description BLOOD RIGHT AC  Final   Special Requests BOTTLES DRAWN AEROBIC AND ANAEROBIC  3CC  Final   Culture  Setup Time   Final    GRAM NEGATIVE RODS IN BOTH AEROBIC AND ANAEROBIC BOTTLES CRITICAL RESULT CALLED TO, READ BACK BY AND VERIFIED WITH: CHAROLOTTE KYEI AT 2342 05/06/15.PMH CONFIRMED BY WM    Culture   Final    ESCHERICHIA COLI IN BOTH AEROBIC AND ANAEROBIC BOTTLES Results Called to: [facility] TRAVIS AT 0740 05/08/15 DV ESBL-EXTENDED SPECTRUM BETA LACTAMASE-THE ORGANISM IS RESISTANT TO PENICILLINS, CEPHALOSPORINS AND AZTREONAM ACCORDING TO CLSI M100-S15 VOL.[place].    Report Status 05/08/2015 FINAL  Final   Organism ID, Bacteria ESCHERICHIA COLI  Final      Susceptibility   Escherichia coli - MIC*    AMPICILLIN >=32 RESISTANT Resistant     CEFTAZIDIME 16 RESISTANT Resistant     CEFAZOLIN >=64 RESISTANT Resistant     CEFTRIAXONE >=64 RESISTANT Resistant     CIPROFLOXACIN >=4 RESISTANT Resistant     GENTAMICIN <=1 SENSITIVE Sensitive     IMIPENEM <=0.25 SENSITIVE Sensitive     TRIMETH/SULFA >=320 RESISTANT Resistant     Extended ESBL POSITIVE Resistant     PIP/TAZO Value in next row Intermediate      INTERMEDIATE64    * ESCHERICHIA COLI  Culture, blood (routine x 2)     Status: None   Collection Time: 05/06/15  1:13 PM  Result Value Ref Range Status   Specimen Description BLOOD LEFT AC  Final   Special Requests BOTTLES DRAWN AEROBIC AND ANAEROBIC  1CC  Final   Culture   Setup Time   Final    GRAM NEGATIVE RODS IN BOTH AEROBIC AND ANAEROBIC BOTTLES CRITICAL RESULT CALLED TO, READ BACK BY AND VERIFIED WITH: CHARLOTTE KYEI AT 2146 05/06/15.PMH CONFIRMED BY CAF    Culture   Final    ESCHERICHIA COLI IN BOTH AEROBIC AND ANAEROBIC BOTTLES Results Called to: [facility] TRAVIS AT 0740  05/08/15 DV ESBL-EXTENDED SPECTRUM BETA LACTAMASE-THE ORGANISM IS RESISTANT TO PENICILLINS, CEPHALOSPORINS AND AZTREONAM ACCORDING TO CLSI M100-S15 VOL.[place].    Report Status 05/08/2015 FINAL  Final   Organism ID, Bacteria ESCHERICHIA COLI  Final      Susceptibility   Escherichia coli - MIC*    AMPICILLIN >=32 RESISTANT Resistant     CEFTAZIDIME 16 RESISTANT Resistant     CEFAZOLIN >=64 RESISTANT Resistant     CEFTRIAXONE >=64 RESISTANT Resistant     CIPROFLOXACIN >=4 RESISTANT Resistant     GENTAMICIN <=1 SENSITIVE Sensitive     IMIPENEM <=0.25 SENSITIVE Sensitive     TRIMETH/SULFA >=320 RESISTANT Resistant     Extended ESBL POSITIVE Resistant     PIP/TAZO Value in next row Intermediate      INTERMEDIATE64    * ESCHERICHIA COLI  MRSA PCR Screening     Status: None   Collection Time: 05/07/15  7:14 AM  Result Value Ref Range Status   MRSA by PCR NEGATIVE NEGATIVE Final    Comment:        The GeneXpert MRSA Assay (FDA approved for NASAL specimens only), is one component of a comprehensive MRSA colonization surveillance program. It is not intended to diagnose MRSA infection nor to guide or monitor treatment for MRSA infections.     Coagulation Studies: No results for input(s): LABPROT, INR in the last 72 hours.  Urinalysis: No results for input(s): COLORURINE, LABSPEC, PHURINE, GLUCOSEU, HGBUR, BILIRUBINUR, KETONESUR, PROTEINUR, UROBILINOGEN, NITRITE, LEUKOCYTESUR in the last 72 hours.  Invalid input(s): APPERANCEUR    Imaging: No results found.   Medications:     . darifenacin  7.5 mg Oral Daily  . feeding supplement (ENSURE ENLIVE)  237 mL  Oral BID WC  . FLUoxetine  40 mg Oral Daily  . heparin subcutaneous  5,000 Units Subcutaneous 3 times per day  . hydrocortisone   Rectal BID  . levothyroxine  50 mcg Oral QAC breakfast  . meropenem (MERREM) IV  500 mg Intravenous Q12H  . mometasone-formoterol  2 puff Inhalation BID  . risperiDONE  3 mg Oral QHS  . sodium chloride  10-40 mL Intracatheter Q12H  . tamsulosin  0.4 mg Oral Daily   acetaminophen **OR** acetaminophen, alum & mag hydroxide-simeth, HYDROmorphone (DILAUDID) injection, ondansetron **OR** ondansetron (ZOFRAN) IV, opium-belladonna, sodium chloride, sodium chloride  Assessment/ Plan:  [name] is a 59 y.o. white male with schizophrenia, seizure disorder, CVA, coronary artery disease, COPD, peptic ulcer disease, hypothyroidism, right nephrectomy, nephrolithiasis and neurogenic bladder.   1. Acute renal failure on chronic kidney disease stage III: baseline creatinine of 1.95, eGFR of 35. Acute renal failure from sepsis, urinary tract infection and possibly obstructive uropathy.  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  Creatinine seems to be at baseline With polyuria: most likely due to post ATN/post obstructive diuresis and less likely to patient having an underlying diabetes insipidus or psychogenic polydipsia.  Continue monitoring renal function, urine output, and serum electrolytes.   2. 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 CO2 now at goal.  Now off bicarb gtt  3. Hyponatremia: improved and now 141.   4. Sepsis secondary to urinary tract infection: renally dosed meropenem. E. Coli on urine culture. Now off vasopressors.   Appreciate ID input: meropenem  5. nephrolithiasis  Left kidney stone noted on ultrasound   LOS: 6 Damariz Paganelli 12/10/201612:55 PM

## 2015-05-12 NOTE — Plan of Care (Signed)
Problem: Fluid Volume: Goal: Hemodynamic stability will improve Outcome: Progressing PO intake encouraged this shift.  Patient with complaint of heartburn which was resolved with administration of Maalox.  Foley catheter remains intact and patent this shift.  Output is free from sediment and odor.      Problem: Physical Regulation: Goal: Signs and symptoms of infection will decrease Outcome: Progressing Patient continues on Merrem IVPB.  Tolerating well.  WBC's Improving.

## 2015-05-13 LAB — CBC
HCT: 37 % — ABNORMAL LOW (ref 40.0–52.0)
Hemoglobin: 12.4 g/dL — ABNORMAL LOW (ref 13.0–18.0)
MCH: 30.7 pg (ref 26.0–34.0)
MCHC: 33.4 g/dL (ref 32.0–36.0)
MCV: 91.8 fL (ref 80.0–100.0)
PLATELETS: 157 10*3/uL (ref 150–440)
RBC: 4.03 MIL/uL — ABNORMAL LOW (ref 4.40–5.90)
RDW: 15.4 % — AB (ref 11.5–14.5)
WBC: 22.4 10*3/uL — ABNORMAL HIGH (ref 3.8–10.6)

## 2015-05-13 LAB — BASIC METABOLIC PANEL
ANION GAP: 7 (ref 5–15)
BUN: 45 mg/dL — AB (ref 6–20)
CALCIUM: 8.9 mg/dL (ref 8.9–10.3)
CO2: 25 mmol/L (ref 22–32)
Chloride: 107 mmol/L (ref 101–111)
Creatinine, Ser: 1.75 mg/dL — ABNORMAL HIGH (ref 0.61–1.24)
GFR calc Af Amer: 47 mL/min — ABNORMAL LOW (ref >60)
GFR, EST NON AFRICAN AMERICAN: 41 mL/min — AB (ref >60)
GLUCOSE: 92 mg/dL (ref 65–99)
Potassium: 5.1 mmol/L (ref 3.5–5.1)
Sodium: 139 mmol/L (ref 135–145)

## 2015-05-13 NOTE — Progress Notes (Signed)
[facility]  ROUNDING NOTE   Subjective:   Creatinine 1.75 (1.88) (2.02) (2.65) (3.86) UOP 5600 (6150) (4050) (9400) (10750)  Overall doing well No acute c/o today  Objective:  Vital signs in last 24 hours:  Temp:  [97.6 F (36.4 C)-98.3 F (36.8 C)] 98.3 F (36.8 C) (12/11 0537) Pulse Rate:  [95-101] 95 (12/11 0537) Resp:  [18-19] 18 (12/11 0537) BP: (104-126)/(67-98) 104/67 mmHg (12/11 0537) SpO2:  [94 %-97 %] 94 % (12/11 0537)  Weight change:  Filed Weights   05/06/15 1054 05/06/15 1853 05/07/15 0700  Weight: 70.761 kg (156 lb) 71.668 kg (158 lb) 74.8 kg (164 lb 14.5 oz)    Intake/Output: I/O last 3 completed shifts: In: -  Out: 9200 [Urine:9200]   Intake/Output this shift:  Total I/O In: 50 [IV Piggyback:50] Out: 700 [Urine:700]  Physical Exam: General: NAD, sitting up in bed.   Head: +temporal wasting. Moist oral mucosal membranes  Eyes: Anicteric,  Neck: Supple, trachea midline  Lungs:  Clear to auscultation  Heart: Regular rate and rhythm  Abdomen:  Soft, nontender,   Extremities:  no peripheral edema.  Neurologic: Nonfocal, moving all four extremities  Skin: No lesions   foley    Basic Metabolic Panel:  Recent Labs Lab 05/08/15 0445 05/09/15 0420 05/10/15 0530 05/11/15 0414 05/13/15 0535  NA 130* 138 143 141 139  K 4.3 4.1 4.5 4.8 5.1  CL 107 112* 111 112* 107  CO2 16* 20* _0 GLUCOSE 83 117* 97 89 92  BUN 54* 46* 32* 37* 45*  CREATININE 3.86* 2.65* 2.02* 1.88* 1.75*  CALCIUM 7.3* 8.1* 8.6* 9.0 8.9    Liver Function Tests: No results for input(s): AST, ALT, ALKPHOS, BILITOT, PROT, ALBUMIN in the last 168 hours. No results for input(s): LIPASE, AMYLASE in the last 168 hours. No results for input(s): AMMONIA in the last 168 hours.  CBC:  Recent Labs Lab 05/08/15 0445 05/09/15 0420 05/10/15 0530 05/11/15 0414 05/13/15 0535  WBC 45.5* 35.2* 22.1* 16.7* 22.4*  HGB 10.5* 11.0* 11.7* 12.2* 12.4*  HCT 32.4*  32.7* 35.1* 36.4* 37.0*  MCV 91.7 90.8 90.9 90.4 91.8  PLT 97* 90* 107* 99* 157    Cardiac Enzymes: No results for input(s): CKTOTAL, CKMB, CKMBINDEX, TROPONINI in the last 168 hours.  BNP: Invalid input(s): POCBNP  CBG: No results for input(s): GLUCAP in the last 168 hours.  Microbiology: Results for orders placed or performed during the hospital encounter of 05/06/15  Urine culture     Status: None   Collection Time: 05/06/15 11:12 AM  Result Value Ref Range Status   Specimen Description URINE, RANDOM  Final   Special Requests NONE  Final   Culture   Final    >=100,000 COLONIES/mL ESCHERICHIA COLI >=100,000 COLONIES/mL PSEUDOMONAS AERUGINOSA Results Called to: KRISTA YOUNG AT 1245 05/10/15 CTJ ESBL-EXTENDED SPECTRUM BETA LACTAMASE-THE ORGANISM IS RESISTANT TO PENICILLINS, CEPHALOSPORINS AND AZTREONAM ACCORDING TO CLSI M100-S15 VOL.[place].    Report Status 05/10/2015 FINAL  Final   Organism ID, Bacteria PSEUDOMONAS AERUGINOSA  Final   Organism ID, Bacteria ESCHERICHIA COLI  Final      Susceptibility   Escherichia coli - MIC*    AMPICILLIN >=32 RESISTANT Resistant     CEFTAZIDIME 16 RESISTANT Resistant     CEFAZOLIN >=64 RESISTANT Resistant     CEFTRIAXONE >=64 RESISTANT Resistant     CIPROFLOXACIN >=4 RESISTANT Resistant     GENTAMICIN <=1 SENSITIVE Sensitive  IMIPENEM <=0.25 SENSITIVE Sensitive     TRIMETH/SULFA >=320 RESISTANT Resistant     Extended ESBL POSITIVE Resistant     NITROFURANTOIN Value in next row Sensitive      SENSITIVE<=16    PIP/TAZO Value in next row Intermediate      INTERMEDIATE64    * >=100,000 COLONIES/mL ESCHERICHIA COLI   Pseudomonas aeruginosa - MIC*    CIPROFLOXACIN Value in next row Intermediate      INTERMEDIATE2    GENTAMICIN Value in next row Intermediate      INTERMEDIATE8    IMIPENEM Value in next row Sensitive      SENSITIVE2    PIP/TAZO Value in next row Sensitive      SENSITIVE32    AMPICILLIN Value in next row  Resistant      RESISTANT>=32    CEFAZOLIN Value in next row Resistant      RESISTANT>=64    CEFTRIAXONE Value in next row Resistant      RESISTANT>=64    LEVOFLOXACIN Value in next row Resistant      RESISTANT>=8    NITROFURANTOIN Value in next row Resistant      RESISTANT>=512    TRIMETH/SULFA Value in next row Resistant      RESISTANT>=320    * >=100,000 COLONIES/mL PSEUDOMONAS AERUGINOSA  Culture, blood (routine x 2)     Status: None   Collection Time: 05/06/15  1:08 PM  Result Value Ref Range Status   Specimen Description BLOOD RIGHT AC  Final   Special Requests BOTTLES DRAWN AEROBIC AND ANAEROBIC  3CC  Final   Culture  Setup Time   Final    GRAM NEGATIVE RODS IN BOTH AEROBIC AND ANAEROBIC BOTTLES CRITICAL RESULT CALLED TO, READ BACK BY AND VERIFIED WITH: CHAROLOTTE KYEI AT 2342 05/06/15.PMH CONFIRMED BY WM    Culture   Final    ESCHERICHIA COLI IN BOTH AEROBIC AND ANAEROBIC BOTTLES Results Called to: [facility] TRAVIS AT 0740 05/08/15 DV ESBL-EXTENDED SPECTRUM BETA LACTAMASE-THE ORGANISM IS RESISTANT TO PENICILLINS, CEPHALOSPORINS AND AZTREONAM ACCORDING TO CLSI M100-S15 VOL.[place].    Report Status 05/08/2015 FINAL  Final   Organism ID, Bacteria ESCHERICHIA COLI  Final      Susceptibility   Escherichia coli - MIC*    AMPICILLIN >=32 RESISTANT Resistant     CEFTAZIDIME 16 RESISTANT Resistant     CEFAZOLIN >=64 RESISTANT Resistant     CEFTRIAXONE >=64 RESISTANT Resistant     CIPROFLOXACIN >=4 RESISTANT Resistant     GENTAMICIN <=1 SENSITIVE Sensitive     IMIPENEM <=0.25 SENSITIVE Sensitive     TRIMETH/SULFA >=320 RESISTANT Resistant     Extended ESBL POSITIVE Resistant     PIP/TAZO Value in next row Intermediate      INTERMEDIATE64    * ESCHERICHIA COLI  Culture, blood (routine x 2)     Status: None   Collection Time: 05/06/15  1:13 PM  Result Value Ref Range Status   Specimen Description BLOOD LEFT AC  Final   Special Requests BOTTLES DRAWN AEROBIC AND  ANAEROBIC  1CC  Final   Culture  Setup Time   Final    GRAM NEGATIVE RODS IN BOTH AEROBIC AND ANAEROBIC BOTTLES CRITICAL RESULT CALLED TO, READ BACK BY AND VERIFIED WITH: CHARLOTTE KYEI AT 2146 05/06/15.PMH CONFIRMED BY CAF    Culture   Final    ESCHERICHIA COLI IN BOTH AEROBIC AND ANAEROBIC BOTTLES Results Called to: [facility] TRAVIS AT 0626 05/08/15 DV ESBL-EXTENDED SPECTRUM BETA LACTAMASE-THE  ORGANISM IS RESISTANT TO PENICILLINS, CEPHALOSPORINS AND AZTREONAM ACCORDING TO CLSI M100-S15 VOL.[place].    Report Status 05/08/2015 FINAL  Final   Organism ID, Bacteria ESCHERICHIA COLI  Final      Susceptibility   Escherichia coli - MIC*    AMPICILLIN >=32 RESISTANT Resistant     CEFTAZIDIME 16 RESISTANT Resistant     CEFAZOLIN >=64 RESISTANT Resistant     CEFTRIAXONE >=64 RESISTANT Resistant     CIPROFLOXACIN >=4 RESISTANT Resistant     GENTAMICIN <=1 SENSITIVE Sensitive     IMIPENEM <=0.25 SENSITIVE Sensitive     TRIMETH/SULFA >=320 RESISTANT Resistant     Extended ESBL POSITIVE Resistant     PIP/TAZO Value in next row Intermediate      INTERMEDIATE64    * ESCHERICHIA COLI  MRSA PCR Screening     Status: None   Collection Time: 05/07/15  7:14 AM  Result Value Ref Range Status   MRSA by PCR NEGATIVE NEGATIVE Final    Comment:        The GeneXpert MRSA Assay (FDA approved for NASAL specimens only), is one component of a comprehensive MRSA colonization surveillance program. It is not intended to diagnose MRSA infection nor to guide or monitor treatment for MRSA infections.     Coagulation Studies: No results for input(s): LABPROT, INR in the last 72 hours.  Urinalysis: No results for input(s): COLORURINE, LABSPEC, PHURINE, GLUCOSEU, HGBUR, BILIRUBINUR, KETONESUR, PROTEINUR, UROBILINOGEN, NITRITE, LEUKOCYTESUR in the last 72 hours.  Invalid input(s): APPERANCEUR    Imaging: No results found.   Medications:     . darifenacin  7.5 mg Oral Daily  . feeding  supplement (ENSURE ENLIVE)  237 mL Oral BID WC  . FLUoxetine  40 mg Oral Daily  . heparin subcutaneous  5,000 Units Subcutaneous 3 times per day  . hydrocortisone   Rectal BID  . levothyroxine  50 mcg Oral QAC breakfast  . meropenem (MERREM) IV  500 mg Intravenous Q12H  . mometasone-formoterol  2 puff Inhalation BID  . risperiDONE  3 mg Oral QHS  . sodium chloride  10-40 mL Intracatheter Q12H  . tamsulosin  0.4 mg Oral Daily   acetaminophen **OR** acetaminophen, alum & mag hydroxide-simeth, HYDROmorphone (DILAUDID) injection, ondansetron **OR** ondansetron (ZOFRAN) IV, opium-belladonna, sodium chloride, sodium chloride  Assessment/ Plan:  [name] is a 59 y.o. white male with schizophrenia, seizure disorder, CVA, coronary artery disease, COPD, peptic ulcer disease, hypothyroidism, right nephrectomy, nephrolithiasis and neurogenic bladder.   1. Acute renal failure on chronic kidney disease stage III: baseline creatinine of 1.95, eGFR of 35. Acute renal failure from sepsis, urinary tract infection and possibly obstructive uropathy.  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  Creatinine seems to be at baseline With polyuria: most likely due to post ATN/post obstructive diuresis and less likely to patient having an underlying diabetes insipidus or psychogenic polydipsia.  Continue monitoring renal function, urine output, and serum electrolytes.   2. 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 CO2 now at goal.  Now off bicarb gtt  3. Hyponatremia: improved   4. Sepsis secondary to urinary tract infection: renally dosed meropenem. E. Coli on urine culture. Now off vasopressors.   5. nephrolithiasis  Left kidney stone noted on ultrasound   LOS: 7 Alan Small 12/11/20161:18 PM

## 2015-05-13 NOTE — Plan of Care (Signed)
Problem: Health Behavior/Discharge Planning: Goal: Ability to manage health-related needs will improve Outcome: Progressing Social worker searching for placement since group home will not take pt back while in IV ABT.  Problem: Physical Regulation: Goal: Will remain free from infection Outcome: Progressing VSS, afebrile, pt resting comfortably in bed  Problem: Spiritual Needs Goal: Ability to function at adequate level Outcome: [place] in to visit with pt today

## 2015-05-13 NOTE — Progress Notes (Signed)
[facility] [place] NAME: Alan Small    MR#:  [id number]  DATE OF BIRTH:  June 12, 1955  SUBJECTIVE:  CHIEF COMPLAINT:   Chief Complaint  Patient presents with  . Abdominal Pain   - urine output up to 4 L again over last 24hrs. - awaiting PASSR for rehab placement as he will need 4 more days of IV ABX. He denies any complaints. -Requesting to go out and smoke.  REVIEW OF SYSTEMS:  Review of Systems  Constitutional: Negative for fever and chills.  Respiratory: Negative for cough, shortness of breath and wheezing.   Cardiovascular: Negative for chest pain and palpitations.  Gastrointestinal: Negative for nausea, vomiting, abdominal pain, diarrhea and constipation.  Genitourinary: Negative for dysuria.  Neurological: Negative for dizziness, seizures and headaches.    DRUG ALLERGIES:   Allergies  Allergen Reactions  . Mellaril [Thioridazine] Other (See Comments)    States sinuses stops/ swell up   . Morphine And Related Itching  . Navane [Thiothixene] Other (See Comments)    GI Distress  . Thorazine [Chlorpromazine] Other (See Comments)    Sensitive to sunlight. Sunburns easily    VITALS:  Blood pressure 104/67, pulse 95, temperature 98.3 F (36.8 C), temperature source Oral, resp. rate 18, height 6' (1.829 m), weight 74.8 kg (164 lb 14.5 oz), SpO2 94 %.  PHYSICAL EXAMINATION:  Physical Exam  GENERAL:  59 y.o.-year-old patient lying in the bed with no acute distress. disshelved. EYES: Pupils equal, round, reactive to light and accommodation. No scleral icterus. Extraocular muscles intact.  HEENT: Head atraumatic, normocephalic. Oropharynx and nasopharynx clear.  NECK:  Supple, no jugular venous distention. No thyroid enlargement, no tenderness.  LUNGS: Normal breath sounds bilaterally, no wheezing, rales,rhonchi or crepitation. No use of accessory muscles of respiration.  CARDIOVASCULAR: S1, S2 normal. No murmurs,  rubs, or gallops.  ABDOMEN: Soft, nontender, nondistended. Bowel sounds present. No organomegaly or mass. Some suprapubic discomfort on palpation noted. Has a chronic indwelling Foley catheter. Catheter last changed on 05/07/15-now no bleeding around urethral orifice  EXTREMITIES: No pedal edema, cyanosis, or clubbing.  NEUROLOGIC: Cranial nerves II through XII are intact. Muscle strength 5/5 in all extremities. Sensation intact. Gait not checked.  PSYCHIATRIC: The patient is alert and oriented x 3.  SKIN: No obvious rash, lesion, or ulcer.    LABORATORY PANEL:   CBC  Recent Labs Lab 05/13/15 0535  WBC 22.4*  HGB 12.4*  HCT 37.0*  PLT 157   ------------------------------------------------------------------------------------------------------------------  Chemistries   Recent Labs Lab 05/06/15 1057  05/13/15 0535  NA 142  < > 139  K 4.5  < > 5.1  CL 109  < > 107  CO2 18*  < > 25  GLUCOSE 112*  < > 92  BUN 34*  < > 45*  CREATININE 2.54*  < > 1.75*  CALCIUM 9.7  < > 8.9  AST 26  --   --   ALT 20  --   --   ALKPHOS 106  --   --   BILITOT 0.4  --   --   < > = values in this interval not displayed. ------------------------------------------------------------------------------------------------------------------  Cardiac Enzymes No results for input(s): TROPONINI in the last 168 hours. ------------------------------------------------------------------------------------------------------------------  RADIOLOGY:  No results found.  EKG:   Orders placed or performed during the hospital encounter of 02/22/15  . EKG    ASSESSMENT AND PLAN:   59 year old male with past medical history of paranoid  schizophrenia, hypothyroidism, history of chronic indwelling Foley catheter, history of multidrug resistant UTI, depression who presents to the hospital due to abdominal pain and noted to have sepsis secondary to UTI.   #1 Sepsis with septic shock requiring pressors-the  cultures growing ESBL Escherichia coli, urine cultures growing pseudomonas -off IV fluids and off levophed -has PICC line and on IV meropenem 500mg  IV BID until 05/17/15 (AM dose) - will need rehab for the same -ID consult appreciated  #2 Urinary tract infection-patient is high risk given his chronic indwelling Foley due to neurogenic bladder. - cultures growing pseudomonas - UTI related to his chronic indwelling foley -On IV meropenem- until 05/17/15, PICC line in. -bladder spasms- on belladonna opioids suppositories  - outpatient urology f/u recommended and recommend discontinuing foley and starting intermittent catheterizations   #3 Acute renal failure on CKD-follows with nephrology at [facility]. -Baseline GFR seems to be around 30, stage III CKD, baseline creatinine around 2.1. Creatinine and GFR Improving now and close to baseline now - urine output today at 4L in  24hrs now-likely post ATN diuresis, normal osm - advised to cut down on his soda drinking- drinks about 3 bottles of 540ml pepsi and also atleast 3L of water every day- even in the hospital -[place] nephrology consult. Check BMP for electrolytes tomorrow -Avoid nephrotoxins.  - Patient is status post right nephrectomy for a nonfunctioning kidney.   #4 COPD-no acute exacerbation. -Continue home inhalers.  #5 Paranoid schizophrenia-continue risperidone  -On Prozac for depression    #6 DVT prophylaxis-on subcutaneous heparin   #7 Rectal prolapse- chronic, reducible - surgical consult appreciated - since chronic and reducible and patient doesn't want surgery- no further intervention  #8 Tobacco use disorder- refused nicotine patch.  Uses a cane at baseline. Might need rehab for IV antibiotics Appreciate social worker and care management consults. Awaiting PASSR  All the records are reviewed and case discussed with Care Management/Social Workerr. Management plans discussed with the patient, family and they are  in agreement.  CODE STATUS: Full Code  TOTAL  TIME SPENT IN TAKING CARE OF THIS PATIENT: 36 minutes.   POSSIBLE D/C IN 1-2 DAYS, DEPENDING ON CLINICAL CONDITION and whenever bed available.   Gladstone Lighter M.D on 05/13/2015 at 10:25 AM  Between 7am to 6pm - Pager - [phone]  After 6pm go to [email or web address] - password [email or web address] [place]  Office  [phone]  CC: Primary care physician; [name], MD

## 2015-05-13 NOTE — Plan of Care (Signed)
Problem: Education: Goal: Knowledge of [facility] Education information/materials will improve Outcome: Progressing Plan of care for shift reviewed with patient and family.  Patient and sister educated about high risk fall precautions and isolation precautions.  Sister educated about need to wash hands when leaving room.  Patient and sister acknowledged understanding.  Problem: Health Behavior/Discharge Planning: Goal: Ability to manage health-related needs will improve Outcome: Progressing Per report from day nurse and social worker note from 05/11/2015 plan to be discharge to SNF. Awaiting PASARR.   Problem: Fluid Volume: Goal: Hemodynamic stability will improve Outcome: Progressing PO intake encouraged this shift. Patient with complaint of heartburn which was resolved with administration of Maalox. Foley catheter remains intact and patent this shift. Urine output is light yellow and free from sediment and odor.    Problem: Physical Regulation: Goal: Signs and symptoms of infection will decrease Outcome: Progressing Patient continues on Merrem IVPB. Tolerating well. Skin is intact and patient is able to turn self.  SCD use this shift.  Heparin given twice.

## 2015-05-14 LAB — BASIC METABOLIC PANEL
ANION GAP: 7 (ref 5–15)
BUN: 54 mg/dL — ABNORMAL HIGH (ref 6–20)
CALCIUM: 9.3 mg/dL (ref 8.9–10.3)
CO2: 23 mmol/L (ref 22–32)
CREATININE: 1.94 mg/dL — AB (ref 0.61–1.24)
Chloride: 108 mmol/L (ref 101–111)
GFR, EST AFRICAN AMERICAN: 42 mL/min — AB (ref >60)
GFR, EST NON AFRICAN AMERICAN: 36 mL/min — AB (ref >60)
Glucose, Bld: 93 mg/dL (ref 65–99)
Potassium: 5.5 mmol/L — ABNORMAL HIGH (ref 3.5–5.1)
SODIUM: 138 mmol/L (ref 135–145)

## 2015-05-14 LAB — CBC
HCT: 38.5 % — ABNORMAL LOW (ref 40.0–52.0)
HEMOGLOBIN: 12.5 g/dL — AB (ref 13.0–18.0)
MCH: 29.7 pg (ref 26.0–34.0)
MCHC: 32.5 g/dL (ref 32.0–36.0)
MCV: 91.4 fL (ref 80.0–100.0)
PLATELETS: 199 10*3/uL (ref 150–440)
RBC: 4.21 MIL/uL — AB (ref 4.40–5.90)
RDW: 14.9 % — ABNORMAL HIGH (ref 11.5–14.5)
WBC: 24 10*3/uL — ABNORMAL HIGH (ref 3.8–10.6)

## 2015-05-14 MED ORDER — SODIUM POLYSTYRENE SULFONATE 15 GM/60ML PO SUSP
30.0000 g | Freq: Once | ORAL | Status: AC
  Administered 2015-05-14: 16:00:00 30 g via ORAL
  Filled 2015-05-14: qty 120

## 2015-05-14 MED ORDER — INSULIN ASPART 100 UNIT/ML IV SOLN
20.0000 [IU] | Freq: Once | INTRAVENOUS | Status: AC
  Administered 2015-05-14: 20 [IU] via INTRAVENOUS
  Filled 2015-05-14: qty 0.2

## 2015-05-14 MED ORDER — DEXTROSE 50 % IV SOLN
50.0000 mL | Freq: Once | INTRAVENOUS | Status: AC
  Administered 2015-05-14: 16:00:00 50 mL via INTRAVENOUS
  Filled 2015-05-14: qty 50

## 2015-05-14 MED ORDER — SODIUM CHLORIDE 0.9 % IV SOLN: 500.0000 mg | Freq: Two times a day (BID) | INTRAVENOUS | Status: AC

## 2015-05-14 NOTE — Progress Notes (Signed)
Nutrition Follow-up   INTERVENTION:   Meals and Snacks: Cater to patient preferences Medical Food Supplement Therapy: Continue Ensure as ordered   NUTRITION DIAGNOSIS:   Malnutrition related to social / environmental circumstances as evidenced by moderate depletion of body fat, moderate depletions of muscle mass.  GOAL:   Patient will meet greater than or equal to 90% of their needs; ongoing  MONITOR:    (Energy Intake, Anthropometrics, Digestive System, Electrolyte/Renal Profile)  REASON FOR ASSESSMENT:   Rounds    ASSESSMENT:    Per MD note, awaiting PASSR for placement as pt requiring IV ABX. Nephrology following for acute on chronic renal failure. Pt now on isolation.   Diet Order:  Diet regular Room service appropriate?: Yes; Fluid consistency:: Thin Diet - low sodium heart healthy    Current Nutrition: Pt reports eating 100% of breakfast this am including Ensure, french toast, cereal, coffee and milk. Pt reports having a good appetite yesterday as well eating all of his meals and Ensures.  RD notes per MD note pt drinking a lot of dark sodas and water daily.   Gastrointestinal Profile: Last BM: 05/12/2015   Scheduled Medications:  . darifenacin  7.5 mg Oral Daily  . feeding supplement (ENSURE ENLIVE)  237 mL Oral BID WC  . FLUoxetine  40 mg Oral Daily  . heparin subcutaneous  5,000 Units Subcutaneous 3 times per day  . hydrocortisone   Rectal BID  . levothyroxine  50 mcg Oral QAC breakfast  . meropenem (MERREM) IV  500 mg Intravenous Q12H  . mometasone-formoterol  2 puff Inhalation BID  . risperiDONE  3 mg Oral QHS  . sodium chloride  10-40 mL Intracatheter Q12H  . tamsulosin  0.4 mg Oral Daily     Electrolyte/Renal Profile and Glucose Profile:   Recent Labs Lab 05/11/15 0414 05/13/15 0535 05/14/15 0449  NA 141 139 138  K 4.8 5.1 5.5*  CL 112* 107 108  CO2 24 25 23   BUN 37* 45* 54*  CREATININE 1.88* 1.75* 1.94*  CALCIUM 9.0 8.9 9.3  GLUCOSE  89 92 93   Protein Profile: No results for input(s): ALBUMIN in the last 168 hours.    Weight Trend since Admission: Filed Weights   05/06/15 1054 05/06/15 1853 05/07/15 0700  Weight: 156 lb (70.761 kg) 158 lb (71.668 kg) 164 lb 14.5 oz (74.8 kg)     Skin:  Reviewed, no issues   BMI:  Body mass index is 22.36 kg/(m^2).   LOW Care Level  Dwyane Luo, [place], [place] Pager [phone]

## 2015-05-14 NOTE — Care Management Important Message (Signed)
Important Message  Patient Details  Name: Alan Small MRN: [id number] Date of Birth: November 09, 1955   Medicare Important Message Given:  Yes    Shelbie Ammons, RN 05/14/2015, 1:19 PM

## 2015-05-14 NOTE — Clinical Social Work Placement (Signed)
   CLINICAL SOCIAL WORK PLACEMENT  NOTE  Date:  05/14/2015  Patient Details  Name: Alan Small MRN: [id number] Date of Birth: 03/05/1956  Clinical Social Work is seeking post-discharge placement for this patient at the [place] level of care (*CSW will initial, date and re-position this form in  chart as items are completed):  Yes   Patient/family provided with [facility] Work Department's list of facilities offering this level of care within the geographic area requested by the patient (or if unable, by the patient's family).  Yes   Patient/family informed of their freedom to choose among providers that offer the needed level of care, that participate in Medicare, Medicaid or managed care program needed by the patient, have an available bed and are willing to accept the patient.  Yes   Patient/family informed of [facility]'s ownership interest in [facility] and [facility], as well as of the fact that they are under no obligation to receive care at these facilities.  PASRR submitted to EDS on 05/11/15     PASRR number received on 05/14/15     Existing PASRR number confirmed on       FL2 transmitted to all facilities in geographic area requested by pt/family on 05/11/15     FL2 transmitted to all facilities within larger geographic area on       Patient informed that his/her managed care company has contracts with or will negotiate with certain facilities, including the following:        Yes   Patient/family informed of bed offers received.  Patient chooses bed at [facility]     Physician recommends and patient chooses bed at  [facility])    Patient to be transferred to [facility] on 05/14/15.  Patient to be transferred to facility by [facility]     Patient family notified on 05/14/15 of transfer.  Name of family member notified:  DSS Guardian, Marlin       Additional Comment:     _______________________________________________ Darden Dates, LCSW 05/14/2015, 3:56 PM

## 2015-05-14 NOTE — Clinical Social Work Note (Signed)
Pt is ready for discharge today to [facility]. PASARR has been obtained. [facility] has received discharge information and is ready to admit pt as DSS guardian is signing paperwork. DSS Guardian is aware and agreeable to discharge plan. CSW will update [facility]. RN will call report and EMS will provide transportation. CSW is signing off as no further needs identified.   Darden Dates, MSW, LCSW  Clinical Social Worker  [phone]

## 2015-05-14 NOTE — Progress Notes (Signed)
Physical Therapy Treatment Patient Details Name: Alan Small MRN: [id number] DOB: 12/27/1955 Today's Date: 05/14/2015    History of Present Illness presented to ER secondary to abdominal pain; admitted with sepsis related to UTI.  Hospital course significant for transfer to CCU due to hypotension (requiring pressors); now off with vitals stable and [facility]    PT Comments    Patient eager for OOB and participation with session as able.  Reports mild dizziness with initial transition to upright; resolves with accommodation to upright.  Fatigues quickly (due to limited functional endurance), but demonstrates increased gait distance and activity tolerance compared to initial evaluation; no greater than cga required for any mobility when using RW.  Do recommend continued use of RW at this time for optimal safety/indep with mobility and functional activities.   Follow Up Recommendations  Home health PT     Equipment Recommendations  Rolling walker with 5" wheels    Recommendations for Other Services       Precautions / Restrictions Precautions Precautions: Fall Precaution Comments: seizure history, contact isolation Restrictions Weight Bearing Restrictions: No    Mobility  Bed Mobility Overal bed mobility: Modified Independent                Transfers Overall transfer level: Needs assistance Equipment used: Rolling walker (2 wheeled) Transfers: Sit to/from Stand Sit to Stand: Min guard;Supervision         General transfer comment: tends to pull upon RW despite cuing  Ambulation/Gait Ambulation/Gait assistance: Min guard Ambulation Distance (Feet): 220 Feet Assistive device: Rolling walker (2 wheeled)       General Gait Details: reciprocal stepping wtih fair step height/length and fair cadence; no buckling or LOB.  Does rely moderately on RW; recommend continued use at this time.   Stairs            Wheelchair Mobility    Modified Rankin (Stroke  Patients Only)       Balance                                    Cognition Arousal/Alertness: Awake/alert Behavior During Therapy: WFL for tasks assessed/performed Overall Cognitive Status: Within Functional Limits for tasks assessed                      Exercises Other Exercises Other Exercises: Standing LE therex, 1x10, AROM with RW, cga for safety: heel raises, mini squats, marching, hip flex/ext/abduct/adduct.  Seated rest between sets due to fatigue/limited activity tolerance.    General Comments        Pertinent Vitals/Pain Pain Assessment: No/denies pain    Home Living                      Prior Function            PT Goals (current goals can now be found in the care plan section) Acute Rehab PT Goals Patient Stated Goal: "to return to group home" PT Goal Formulation: With patient Time For Goal Achievement: 05/23/15 Potential to Achieve Goals: Good Progress towards PT goals: Progressing toward goals    Frequency  Min 2X/week    PT Plan Current plan remains appropriate    Co-evaluation             End of Session Equipment Utilized During Treatment: Gait belt Activity Tolerance: Patient tolerated treatment well Patient left: with call bell/phone  within reach;in chair;with chair alarm set     Time: [phone] PT Time Calculation (min) (ACUTE ONLY): 24 min  Charges:  $Gait Training: 8-22 mins $Therapeutic Exercise: 8-22 mins                    G Codes:       Endy Easterly H. Owens Shark, PT, DPT, NCS 05/14/2015, 2:55 PM [phone]

## 2015-05-14 NOTE — Discharge Instructions (Signed)
PICC line care. Remove PICC line once IV abx treatment completed

## 2015-05-14 NOTE — Discharge Summary (Addendum)
[place] [facility] [place] NAME: Alan Small    MR#:  [id number]  [place]:  02-Aug-1955  DATE OF ADMISSION:  05/06/2015 ADMITTING PHYSICIAN: Alan Leber, MD  DATE OF DISCHARGE: 05/14/2015  PRIMARY CARE PHYSICIAN: Alan Median, MD    ADMISSION DIAGNOSIS:  Pyelonephritis [N12] Acute renal failure, unspecified acute renal failure type ([name]) [N17.9]  DISCHARGE DIAGNOSIS:  Septic shock due to severe UTI ESBL UTI on IV meropenem last date 05/17/2015 Acute on chronic renal failure improved History of schizophrenia SECONDARY DIAGNOSIS:   Past Medical History  Diagnosis Date  . Mental disorder   . Seizures ([place])   . Myocardial infarction ([place])   . Stroke ([place])   . Asthma   . Paranoid schizophrenia ([place])   . Gastric ulcer   . External hemorrhoids   . Hypothyroidism     hypo  . Insomnia   . Depression   . Renal disorder     renal failure  . Hydronephrosis     HOSPITAL COURSE:   59 year old male with past medical history of paranoid schizophrenia, hypothyroidism, history of chronic indwelling Foley catheter, history of multidrug resistant UTI, depression who presents to the hospital due to abdominal pain and noted to have sepsis secondary to UTI.   #1 Sepsis with septic shock requiring pressors-the cultures growing ESBL Alan Small, urine cultures growing Alan Small -off IV fluids and off levophed -has PICC line and on IV meropenem 500mg  IV BID until 05/17/15 (AM dose) - will need rehab for the same -ID consult appreciated  #2 Urinary tract infection-patient is high risk given his chronic indwelling Foley due to neurogenic bladder. - cultures growing Alan Small - UTI related to his chronic indwelling foley -On IV meropenem- until 05/17/15, PICC line in. -bladder spasms- on belladonna opioids suppositories  - outpatient urology f/u recommended . Pt has seen Alan Small in the past.He has chronic foley for  several weeks.   #3 Acute renal failure on CKD-follows with nephrology at [facility]. -Baseline GFR seems to be around 30, stage III CKD, baseline creatinine around 2.1. Creatinine and GFR Improving now and close to baseline now - urine output today at 4L in 24hrs now-likely post ATN diuresis, normal osm -Appreciate nephrology consult.  -Avoid nephrotoxins.  - Patient is status post right nephrectomy for a nonfunctioning kidney.   #4 COPD-no acute exacerbation. -Continue home inhalers.  #5 Paranoid schizophrenia-continue risperidone  -On Prozac for depression   #6 DVT prophylaxis-on subcutaneous heparin   #7 Rectal prolapse- chronic, reducible - surgical consult appreciated - since chronic and reducible and patient doesn't want surgery- no further intervention  #8 Tobacco use disorder- refused nicotine patch. Overall appears at baseline [facility]. CONSULTS OBTAINED:  Treatment Team:  Alan Prows, MD Alan Dana, MD  DRUG ALLERGIES:   Allergies  Allergen Reactions  . Mellaril [Thioridazine] Other (See Comments)    States sinuses stops/ swell up   . Morphine And Related Itching  . Navane [Thiothixene] Other (See Comments)    GI Distress  . Thorazine [Chlorpromazine] Other (See Comments)    Sensitive to sunlight. Sunburns easily    DISCHARGE MEDICATIONS:   Current Discharge Medication List    START taking these medications   Details  feeding supplement, ENSURE ENLIVE, (ENSURE ENLIVE) LIQD Take 237 mLs by mouth 2 (two) times daily with a meal. Qty: 237 mL, Refills: 12    hydrocortisone (ANUSOL-HC) 2.5 % rectal cream Place rectally  2 (two) times daily. Qty: 30 g, Refills: 0    meropenem 500 mg in sodium chloride 0.9 % 50 mL Inject 500 mg into the vein every 12 (twelve) hours. Qty: 3 g, Refills: 0    opium-belladonna (B&O SUPPRETTES) 16.2-60 MG suppository Place 1 suppository rectally every 6 (six) hours as needed for bladder  spasms. Qty: 12 suppository, Refills: 0      CONTINUE these medications which have CHANGED   Details  risperiDONE (RISPERDAL) 3 MG tablet Take 1 tablet (3 mg total) by mouth at bedtime. Qty: 20 tablet, Refills: 0      CONTINUE these medications which have NOT CHANGED   Details  albuterol-ipratropium (COMBIVENT) 18-103 MCG/ACT inhaler Inhale 2 puffs into the lungs every 6 (six) hours as needed for wheezing.    FLUoxetine (PROZAC) 40 MG capsule Take 40 mg by mouth daily.    Fluticasone-Salmeterol (ADVAIR) 250-50 MCG/DOSE AEPB Inhale 1 puff into the lungs 2 (two) times daily.    levothyroxine (SYNTHROID, LEVOTHROID) 50 MCG tablet Take 50 mcg by mouth daily before breakfast.    solifenacin (VESICARE) 5 MG tablet Take 5 mg by mouth daily.    tamsulosin (FLOMAX) 0.4 MG CAPS capsule Take 0.4 mg by mouth daily.      STOP taking these medications     diphenhydrAMINE (BENADRYL) 50 MG capsule      sulfamethoxazole-trimethoprim (BACTRIM,SEPTRA) 400-80 MG tablet      sodium bicarbonate 650 MG tablet         If you experience worsening of your admission symptoms, develop shortness of breath, life threatening emergency, suicidal or homicidal thoughts you must seek medical attention immediately by calling 911 or calling your MD immediately  if symptoms less severe.  You Must read complete instructions/literature along with all the possible adverse reactions/side effects for all the Medicines you take and that have been prescribed to you. Take any new Medicines after you have completely understood and accept all the possible adverse reactions/side effects.   Please note  You were cared for by a hospitalist during your hospital stay. If you have any questions about your discharge medications or the care you received while you were in the hospital after you are discharged, you can call the unit and asked to speak with the hospitalist on call if the hospitalist that took care of you is not  available. Once you are discharged, your primary care physician will handle any further medical issues. Please note that NO REFILLS for any discharge medications will be authorized once you are discharged, as it is imperative that you return to your primary care physician (or establish a relationship with a primary care physician if you do not have one) for your aftercare needs so that they can reassess your need for medications and monitor your lab values. Today   SUBJECTIVE  Doing well no complaints   VITAL SIGNS:  Blood pressure 108/64, pulse 92, temperature 98.3 F (36.8 C), temperature source Oral, resp. rate 19, height 6' (1.829 m), weight 164 lb 14.5 oz (74.8 kg), SpO2 94 %.  I/O:   Intake/Output Summary (Last 24 hours) at 05/14/15 1517 Last data filed at 05/14/15 0851  Gross per 24 hour  Intake      0 ml  Output   2700 ml  Net  -2700 ml    PHYSICAL EXAMINATION:  GENERAL:  59 y.o.-year-old patient lying in the bed with no acute distress.  EYES: Pupils equal, round, reactive to light and accommodation. No scleral  icterus. Extraocular muscles intact.  HEENT: Head atraumatic, normocephalic. Oropharynx and nasopharynx clear.  NECK:  Supple, no jugular venous distention. No thyroid enlargement, no tenderness.  LUNGS: Normal breath sounds bilaterally, no wheezing, rales,rhonchi or crepitation. No use of accessory muscles of respiration.  CARDIOVASCULAR: S1, S2 normal. No murmurs, rubs, or gallops.  ABDOMEN: Soft, non-tender, non-distended. Bowel sounds present. No organomegaly or mass.  EXTREMITIES: No pedal edema, cyanosis, or clubbing.  NEUROLOGIC: Cranial nerves II through XII are intact. Muscle strength 5/5 in all extremities. Sensation intact. Gait not checked.  PSYCHIATRIC: The patient is alert and oriented x 3.  SKIN: No obvious rash, lesion, or ulcer.   DATA REVIEW:   CBC   Recent Labs Lab 05/14/15 0449  WBC 24.0*  HGB 12.5*  HCT 38.5*  PLT 199    Chemistries    Recent Labs Lab 05/14/15 0449  NA 138  K 5.5*  CL 108  CO2 23  GLUCOSE 93  BUN 54*  CREATININE 1.94*  CALCIUM 9.3    Microbiology Results   Recent Results (from the past 240 hour(s))  Urine culture     Status: None   Collection Time: 05/06/15 11:12 AM  Result Value Ref Range Status   Specimen Description URINE, RANDOM  Final   Special Requests NONE  Final   Culture   Final    >=100,000 COLONIES/mL Alan Small >=100,000 COLONIES/mL Alan Small AERUGINOSA Results Called to: [place] [id number] 05/10/15 CTJ ESBL-EXTENDED SPECTRUM BETA LACTAMASE-THE ORGANISM IS RESISTANT TO PENICILLINS, CEPHALOSPORINS AND AZTREONAM ACCORDING TO CLSI M100-S15 VOL.[place].    Report Status 05/10/2015 FINAL  Final   Organism ID, Bacteria Alan Small AERUGINOSA  Final   Organism ID, Bacteria Alan Small  Final      Susceptibility   Alan Small - MIC*    AMPICILLIN >=32 RESISTANT Resistant     CEFTAZIDIME 16 RESISTANT Resistant     CEFAZOLIN >=64 RESISTANT Resistant     CEFTRIAXONE >=64 RESISTANT Resistant     CIPROFLOXACIN >=4 RESISTANT Resistant     GENTAMICIN <=1 SENSITIVE Sensitive     IMIPENEM <=0.25 SENSITIVE Sensitive     TRIMETH/SULFA >=320 RESISTANT Resistant     Extended ESBL POSITIVE Resistant     NITROFURANTOIN Value in next row Sensitive      SENSITIVE<=16    PIP/TAZO Value in next row Intermediate      INTERMEDIATE64    * >=100,000 COLONIES/mL Alan Small   Alan Small aeruginosa - MIC*    CIPROFLOXACIN Value in next row Intermediate      INTERMEDIATE2    GENTAMICIN Value in next row Intermediate      INTERMEDIATE8    IMIPENEM Value in next row Sensitive      SENSITIVE2    PIP/TAZO Value in next row Sensitive      SENSITIVE32    AMPICILLIN Value in next row Resistant      RESISTANT>=32    CEFAZOLIN Value in next row Resistant      RESISTANT>=64    CEFTRIAXONE Value in next row Resistant      RESISTANT>=64    LEVOFLOXACIN Value in next  row Resistant      RESISTANT>=8    NITROFURANTOIN Value in next row Resistant      RESISTANT>=512    TRIMETH/SULFA Value in next row Resistant      RESISTANT>=320    * >=100,000 COLONIES/mL Alan Small AERUGINOSA  Culture, blood (routine x 2)     Status: None   Collection  Time: 05/06/15  1:08 PM  Result Value Ref Range Status   Specimen Description BLOOD RIGHT AC  Final   Special Requests BOTTLES DRAWN AEROBIC AND ANAEROBIC  3CC  Final   Culture  Setup Time   Final    GRAM NEGATIVE RODS IN BOTH AEROBIC AND ANAEROBIC BOTTLES CRITICAL RESULT CALLED TO, READ BACK BY AND VERIFIED WITH: CHAROLOTTE KYEI AT 2342 05/06/15.PMH CONFIRMED BY WM    Culture   Final    Alan Small IN BOTH AEROBIC AND ANAEROBIC BOTTLES Results Called to: [facility] TRAVIS AT 0740 05/08/15 DV ESBL-EXTENDED SPECTRUM BETA LACTAMASE-THE ORGANISM IS RESISTANT TO PENICILLINS, CEPHALOSPORINS AND AZTREONAM ACCORDING TO CLSI M100-S15 VOL.[place].    Report Status 05/08/2015 FINAL  Final   Organism ID, Bacteria Alan Small  Final      Susceptibility   Alan Small - MIC*    AMPICILLIN >=32 RESISTANT Resistant     CEFTAZIDIME 16 RESISTANT Resistant     CEFAZOLIN >=64 RESISTANT Resistant     CEFTRIAXONE >=64 RESISTANT Resistant     CIPROFLOXACIN >=4 RESISTANT Resistant     GENTAMICIN <=1 SENSITIVE Sensitive     IMIPENEM <=0.25 SENSITIVE Sensitive     TRIMETH/SULFA >=320 RESISTANT Resistant     Extended ESBL POSITIVE Resistant     PIP/TAZO Value in next row Intermediate      INTERMEDIATE64    * Alan Small  Culture, blood (routine x 2)     Status: None   Collection Time: 05/06/15  1:13 PM  Result Value Ref Range Status   Specimen Description BLOOD LEFT AC  Final   Special Requests BOTTLES DRAWN AEROBIC AND ANAEROBIC  1CC  Final   Culture  Setup Time   Final    GRAM NEGATIVE RODS IN BOTH AEROBIC AND ANAEROBIC BOTTLES CRITICAL RESULT CALLED TO, READ BACK BY AND VERIFIED WITH: CHARLOTTE KYEI  AT 2146 05/06/15.PMH CONFIRMED BY CAF    Culture   Final    Alan Small IN BOTH AEROBIC AND ANAEROBIC BOTTLES Results Called to: [facility] TRAVIS AT 0740 05/08/15 DV ESBL-EXTENDED SPECTRUM BETA LACTAMASE-THE ORGANISM IS RESISTANT TO PENICILLINS, CEPHALOSPORINS AND AZTREONAM ACCORDING TO CLSI M100-S15 VOL.[place].    Report Status 05/08/2015 FINAL  Final   Organism ID, Bacteria Alan Small  Final      Susceptibility   Alan Small - MIC*    AMPICILLIN >=32 RESISTANT Resistant     CEFTAZIDIME 16 RESISTANT Resistant     CEFAZOLIN >=64 RESISTANT Resistant     CEFTRIAXONE >=64 RESISTANT Resistant     CIPROFLOXACIN >=4 RESISTANT Resistant     GENTAMICIN <=1 SENSITIVE Sensitive     IMIPENEM <=0.25 SENSITIVE Sensitive     TRIMETH/SULFA >=320 RESISTANT Resistant     Extended ESBL POSITIVE Resistant     PIP/TAZO Value in next row Intermediate      INTERMEDIATE64    * Alan Small  MRSA PCR Screening     Status: None   Collection Time: 05/07/15  7:14 AM  Result Value Ref Range Status   MRSA by PCR NEGATIVE NEGATIVE Final    Comment:        The GeneXpert MRSA Assay (FDA approved for NASAL specimens only), is one component of a comprehensive MRSA colonization surveillance program. It is not intended to diagnose MRSA infection nor to guide or monitor treatment for MRSA infections.     RADIOLOGY:  No results found.   Management plans discussed with the patient, family and they  are in agreement.  CODE STATUS:     Code Status Orders        Start     Ordered   05/06/15 1857  Full code   Continuous     05/06/15 1856      TOTAL TIME TAKING CARE OF THIS PATIENT: 40 minutes.    Landi Biscardi M.D on 05/14/2015 at 3:17 PM  Between 7am to 6pm - Pager - [phone] After 6pm go to [email or web address] - password [email or web address] [facility]  Office  [phone]  CC: Primary care physician; Alan Median, MD

## 2015-05-14 NOTE — Plan of Care (Signed)
Problem: Health Behavior/Discharge Planning: Goal: Ability to manage health-related needs will improve Outcome: Progressing Pt awaiting placement to facility for ABX treatment.  Problem: Physical Regulation: Goal: Will remain free from infection Outcome: Progressing Pt continues IV ABX.    Problem: Physical Regulation: Goal: Signs and symptoms of infection will decrease Outcome: Progressing VSS. WBC elevated during AM shift on 12/11.  Awaiting new count.

## 2015-05-14 NOTE — Progress Notes (Signed)
[facility]  ROUNDING NOTE   Subjective:  Renal function appears to be fluctuating a bit. Creatinine currently 1.94 with urine output of 6.1 L over the past 3 shifts. Appears to be in good spirits today.   Objective:  Vital signs in last 24 hours:  Temp:  [98.2 F (36.8 C)-98.4 F (36.9 C)] 98.3 F (36.8 C) (12/12 1348) Pulse Rate:  [87-92] 92 (12/12 1348) Resp:  [18-19] 19 (12/12 1348) BP: (103-108)/(55-64) 108/64 mmHg (12/12 1348) SpO2:  [93 %-94 %] 94 % (12/12 1348)  Weight change:  Filed Weights   05/06/15 1054 05/06/15 1853 05/07/15 0700  Weight: 70.761 kg (156 lb) 71.668 kg (158 lb) 74.8 kg (164 lb 14.5 oz)    Intake/Output: I/O last 3 completed shifts: In: 97 [IV Piggyback:50] Out: 1610 [Urine:6150]   Intake/Output this shift:  Total I/O In: -  Out: 750 [Urine:750]  Physical Exam: General: NAD, sitting up in bed.   Head: +temporal wasting. Moist oral mucosal membranes  Eyes: Anicteric  Neck: Supple, trachea midline  Lungs:  Clear to auscultation, normal effort  Heart: Regular rate and rhythm  Abdomen:  Soft, nontender, BS present  Extremities:  no peripheral edema.  Neurologic: Nonfocal, moving all four extremities  Skin: No lesions   foley    Basic Metabolic Panel:  Recent Labs Lab 05/09/15 0420 05/10/15 0530 05/11/15 0414 05/13/15 0535 05/14/15 0449  NA 138 143 141 139 138  K 4.1 4.5 4.8 5.1 5.5*  CL 112* 111 112* 107 108  CO2 20* '27 24 25 23  ' GLUCOSE 117* 97 89 92 93  BUN 46* 32* 37* 45* 54*  CREATININE 2.65* 2.02* 1.88* 1.75* 1.94*  CALCIUM 8.1* 8.6* 9.0 8.9 9.3    Liver Function Tests: No results for input(s): AST, ALT, ALKPHOS, BILITOT, PROT, ALBUMIN in the last 168 hours. No results for input(s): LIPASE, AMYLASE in the last 168 hours. No results for input(s): AMMONIA in the last 168 hours.  CBC:  Recent Labs Lab 05/09/15 0420 05/10/15 0530 05/11/15 0414 05/13/15 0535 05/14/15 0449  WBC 35.2* 22.1* 16.7*  22.4* 24.0*  HGB 11.0* 11.7* 12.2* 12.4* 12.5*  HCT 32.7* 35.1* 36.4* 37.0* 38.5*  MCV 90.8 90.9 90.4 91.8 91.4  PLT 90* 107* 99* 157 199    Cardiac Enzymes: No results for input(s): CKTOTAL, CKMB, CKMBINDEX, TROPONINI in the last 168 hours.  BNP: Invalid input(s): POCBNP  CBG: No results for input(s): GLUCAP in the last 168 hours.  Microbiology: Results for orders placed or performed during the hospital encounter of 05/06/15  Urine culture     Status: None   Collection Time: 05/06/15 11:12 AM  Result Value Ref Range Status   Specimen Description URINE, RANDOM  Final   Special Requests NONE  Final   Culture   Final    >=100,000 COLONIES/mL ESCHERICHIA COLI >=100,000 COLONIES/mL PSEUDOMONAS AERUGINOSA Results Called to: KRISTA YOUNG AT 1245 05/10/15 CTJ ESBL-EXTENDED SPECTRUM BETA LACTAMASE-THE ORGANISM IS RESISTANT TO PENICILLINS, CEPHALOSPORINS AND AZTREONAM ACCORDING TO CLSI M100-S15 VOL.[place].    Report Status 05/10/2015 FINAL  Final   Organism ID, Bacteria PSEUDOMONAS AERUGINOSA  Final   Organism ID, Bacteria ESCHERICHIA COLI  Final      Susceptibility   Escherichia coli - MIC*    AMPICILLIN >=32 RESISTANT Resistant     CEFTAZIDIME 16 RESISTANT Resistant     CEFAZOLIN >=64 RESISTANT Resistant     CEFTRIAXONE >=64 RESISTANT Resistant     CIPROFLOXACIN >=4 RESISTANT Resistant  GENTAMICIN <=1 SENSITIVE Sensitive     IMIPENEM <=0.25 SENSITIVE Sensitive     TRIMETH/SULFA >=320 RESISTANT Resistant     Extended ESBL POSITIVE Resistant     NITROFURANTOIN Value in next row Sensitive      SENSITIVE<=16    PIP/TAZO Value in next row Intermediate      INTERMEDIATE64    * >=100,000 COLONIES/mL ESCHERICHIA COLI   Pseudomonas aeruginosa - MIC*    CIPROFLOXACIN Value in next row Intermediate      INTERMEDIATE2    GENTAMICIN Value in next row Intermediate      INTERMEDIATE8    IMIPENEM Value in next row Sensitive      SENSITIVE2    PIP/TAZO Value in next row  Sensitive      SENSITIVE32    AMPICILLIN Value in next row Resistant      RESISTANT>=32    CEFAZOLIN Value in next row Resistant      RESISTANT>=64    CEFTRIAXONE Value in next row Resistant      RESISTANT>=64    LEVOFLOXACIN Value in next row Resistant      RESISTANT>=8    NITROFURANTOIN Value in next row Resistant      RESISTANT>=512    TRIMETH/SULFA Value in next row Resistant      RESISTANT>=320    * >=100,000 COLONIES/mL PSEUDOMONAS AERUGINOSA  Culture, blood (routine x 2)     Status: None   Collection Time: 05/06/15  1:08 PM  Result Value Ref Range Status   Specimen Description BLOOD RIGHT AC  Final   Special Requests BOTTLES DRAWN AEROBIC AND ANAEROBIC  3CC  Final   Culture  Setup Time   Final    GRAM NEGATIVE RODS IN BOTH AEROBIC AND ANAEROBIC BOTTLES CRITICAL RESULT CALLED TO, READ BACK BY AND VERIFIED WITH: CHAROLOTTE KYEI AT 2342 05/06/15.PMH CONFIRMED BY WM    Culture   Final    ESCHERICHIA COLI IN BOTH AEROBIC AND ANAEROBIC BOTTLES Results Called to: [facility] TRAVIS AT 0740 05/08/15 DV ESBL-EXTENDED SPECTRUM BETA LACTAMASE-THE ORGANISM IS RESISTANT TO PENICILLINS, CEPHALOSPORINS AND AZTREONAM ACCORDING TO CLSI M100-S15 VOL.[place].    Report Status 05/08/2015 FINAL  Final   Organism ID, Bacteria ESCHERICHIA COLI  Final      Susceptibility   Escherichia coli - MIC*    AMPICILLIN >=32 RESISTANT Resistant     CEFTAZIDIME 16 RESISTANT Resistant     CEFAZOLIN >=64 RESISTANT Resistant     CEFTRIAXONE >=64 RESISTANT Resistant     CIPROFLOXACIN >=4 RESISTANT Resistant     GENTAMICIN <=1 SENSITIVE Sensitive     IMIPENEM <=0.25 SENSITIVE Sensitive     TRIMETH/SULFA >=320 RESISTANT Resistant     Extended ESBL POSITIVE Resistant     PIP/TAZO Value in next row Intermediate      INTERMEDIATE64    * ESCHERICHIA COLI  Culture, blood (routine x 2)     Status: None   Collection Time: 05/06/15  1:13 PM  Result Value Ref Range Status   Specimen Description BLOOD LEFT  AC  Final   Special Requests BOTTLES DRAWN AEROBIC AND ANAEROBIC  1CC  Final   Culture  Setup Time   Final    GRAM NEGATIVE RODS IN BOTH AEROBIC AND ANAEROBIC BOTTLES CRITICAL RESULT CALLED TO, READ BACK BY AND VERIFIED WITH: CHARLOTTE KYEI AT 2146 05/06/15.PMH CONFIRMED BY CAF    Culture   Final    ESCHERICHIA COLI IN BOTH AEROBIC AND ANAEROBIC BOTTLES Results Called to: [facility] TRAVIS  AT 0740 05/08/15 DV ESBL-EXTENDED SPECTRUM BETA LACTAMASE-THE ORGANISM IS RESISTANT TO PENICILLINS, CEPHALOSPORINS AND AZTREONAM ACCORDING TO CLSI M100-S15 VOL.[place].    Report Status 05/08/2015 FINAL  Final   Organism ID, Bacteria ESCHERICHIA COLI  Final      Susceptibility   Escherichia coli - MIC*    AMPICILLIN >=32 RESISTANT Resistant     CEFTAZIDIME 16 RESISTANT Resistant     CEFAZOLIN >=64 RESISTANT Resistant     CEFTRIAXONE >=64 RESISTANT Resistant     CIPROFLOXACIN >=4 RESISTANT Resistant     GENTAMICIN <=1 SENSITIVE Sensitive     IMIPENEM <=0.25 SENSITIVE Sensitive     TRIMETH/SULFA >=320 RESISTANT Resistant     Extended ESBL POSITIVE Resistant     PIP/TAZO Value in next row Intermediate      INTERMEDIATE64    * ESCHERICHIA COLI  MRSA PCR Screening     Status: None   Collection Time: 05/07/15  7:14 AM  Result Value Ref Range Status   MRSA by PCR NEGATIVE NEGATIVE Final    Comment:        The GeneXpert MRSA Assay (FDA approved for NASAL specimens only), is one component of a comprehensive MRSA colonization surveillance program. It is not intended to diagnose MRSA infection nor to guide or monitor treatment for MRSA infections.     Coagulation Studies: No results for input(s): LABPROT, INR in the last 72 hours.  Urinalysis: No results for input(s): COLORURINE, LABSPEC, PHURINE, GLUCOSEU, HGBUR, BILIRUBINUR, KETONESUR, PROTEINUR, UROBILINOGEN, NITRITE, LEUKOCYTESUR in the last 72 hours.  Invalid input(s): APPERANCEUR    Imaging: No results  found.   Medications:     . darifenacin  7.5 mg Oral Daily  . feeding supplement (ENSURE ENLIVE)  237 mL Oral BID WC  . FLUoxetine  40 mg Oral Daily  . heparin subcutaneous  5,000 Units Subcutaneous 3 times per day  . hydrocortisone   Rectal BID  . levothyroxine  50 mcg Oral QAC breakfast  . meropenem (MERREM) IV  500 mg Intravenous Q12H  . mometasone-formoterol  2 puff Inhalation BID  . risperiDONE  3 mg Oral QHS  . sodium chloride  10-40 mL Intracatheter Q12H  . tamsulosin  0.4 mg Oral Daily   acetaminophen **OR** acetaminophen, alum & mag hydroxide-simeth, HYDROmorphone (DILAUDID) injection, ondansetron **OR** ondansetron (ZOFRAN) IV, opium-belladonna, sodium chloride, sodium chloride  Assessment/ Plan:  [name] is a 59 y.o. white male with schizophrenia, seizure disorder, CVA, coronary artery disease, COPD, peptic ulcer disease, hypothyroidism, right nephrectomy, nephrolithiasis and neurogenic bladder.   1. Acute renal failure on chronic kidney disease stage III: baseline creatinine of 1.95, eGFR of 35. Acute renal failure from sepsis, urinary tract infection and possibly obstructive uropathy.  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  - renal function appears to have improved overall.  Most recent creatinine was 1.9 with urine output of 6.1 L over the past 3 shifts.  Encouraged patient to drink plenty of liquid at this point in time.  We'll need continued monitoring of renal function as an outpatient.  Will need follow up with Dr. Candiss Norse in 1-2 weeks.  2. 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 Serum bicarb normalized now at 23.  3. Hyponatremia: improved, Na 138 now.   4. Sepsis secondary to urinary tract infection: renally dosed meropenem. E. Coli on urine culture.   5. nephrolithiasis  Left kidney stone noted  on ultrasound   LOS:  8 Tian Mcmurtrey 12/12/20162:13 PM

## 2015-05-14 NOTE — Progress Notes (Signed)
Called report to [place] at [facility].  Pt's IV removed. PICC line and foley remain in.  EMS called for transport.  Clarise Cruz, RN

## 2015-05-28 IMAGING — CT CT STONE STUDY
1 of 2 series · 11 of 16 positions shown, 14 images · non-contrast
Comparison: None

REASON FOR EXAM: altered mental status
COMMENTS:

PROCEDURE:     CT  - CT ABDOMEN /PELVIS WO (STONE)  - November 02, 2012  [DATE]
RESULT:     Indication: Flank Pain
TECHNIQUE: Multiple axial images from the lung bases to the symphysis pubis
were obtained without oral and without intravenous contrast.

[Series 2: soft tissue · axial · 0.73mm/px · z∈[-1050,-630]mm · 11 of 170 slices shown, 14 images]
[im 15/170  soft-tissue]
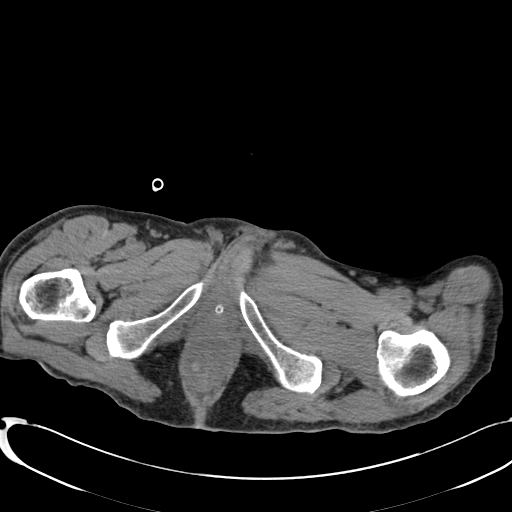
[im 15/170  bone]
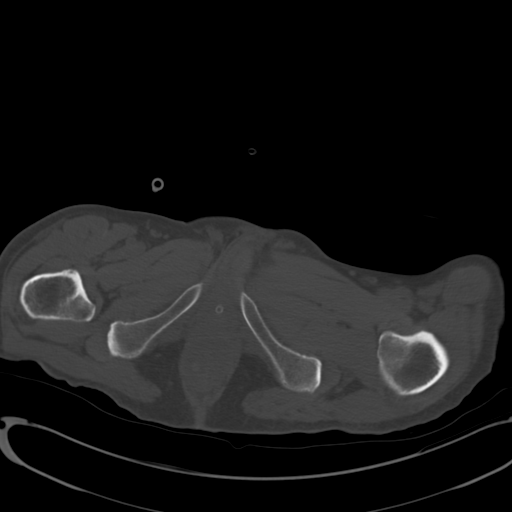
[im 29/170  soft-tissue]
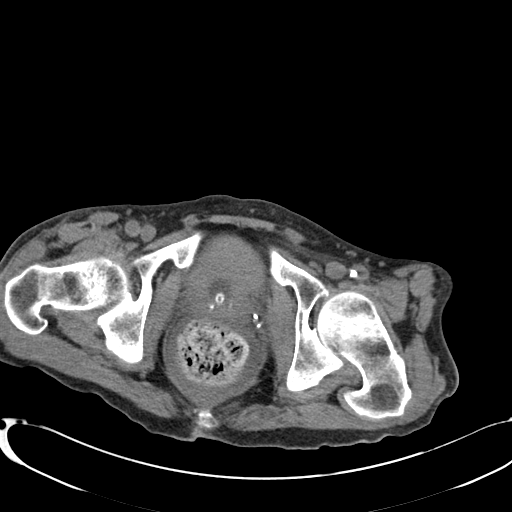
[im 43/170  soft-tissue]
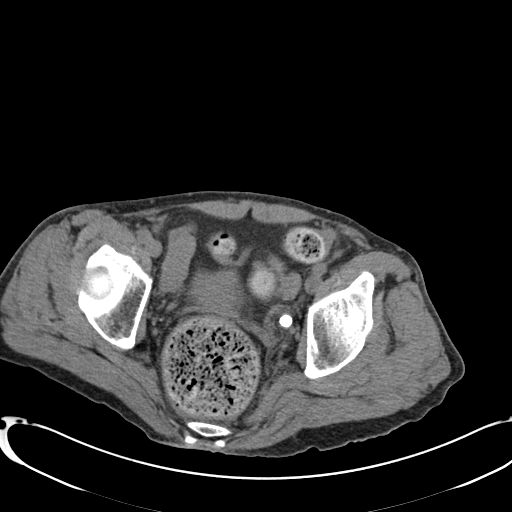
[im 57/170  soft-tissue]
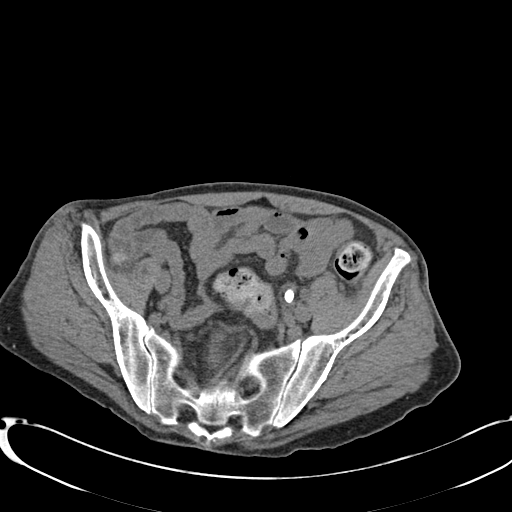
[im 71/170  soft-tissue]
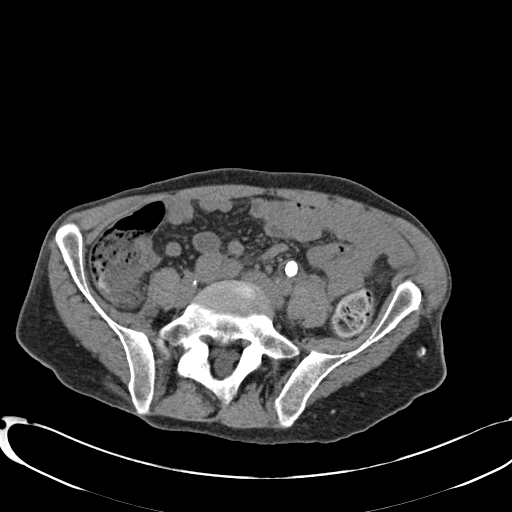
[im 71/170  bone]
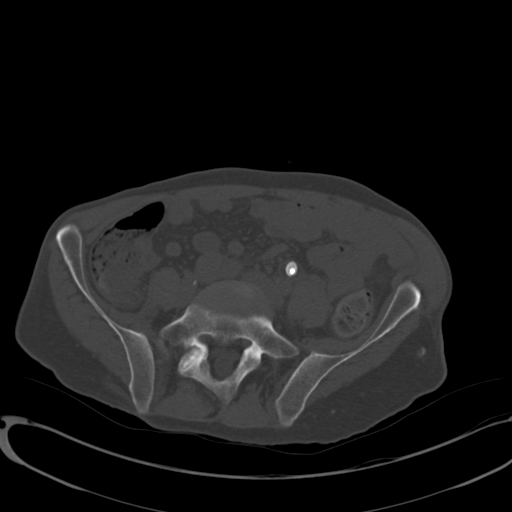
[im 85/170  soft-tissue]
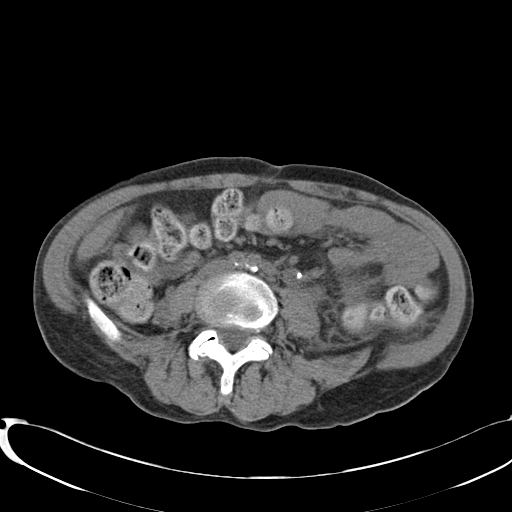
[im 99/170  soft-tissue]
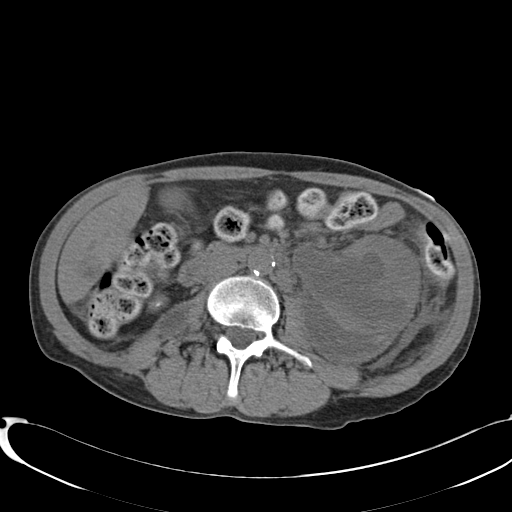
[im 113/170  soft-tissue]
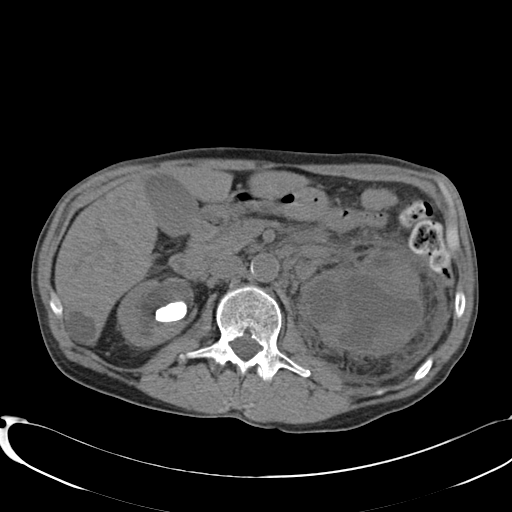
[im 127/170  soft-tissue]
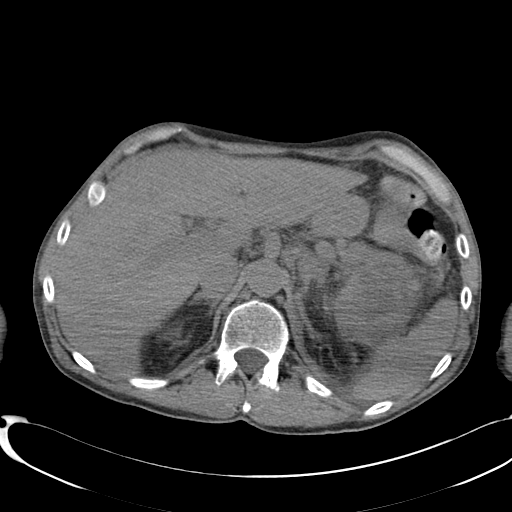
[im 127/170  bone]
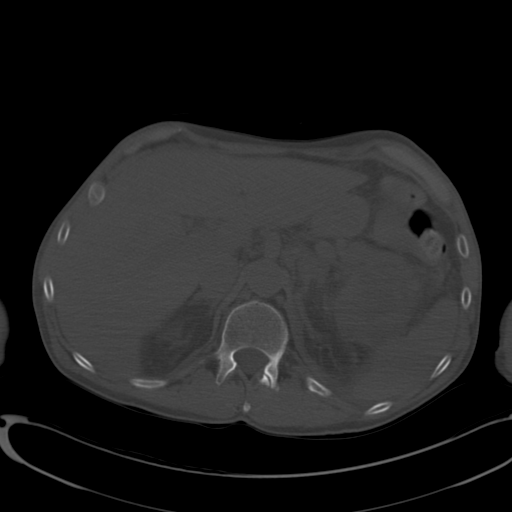
[im 141/170  soft-tissue]
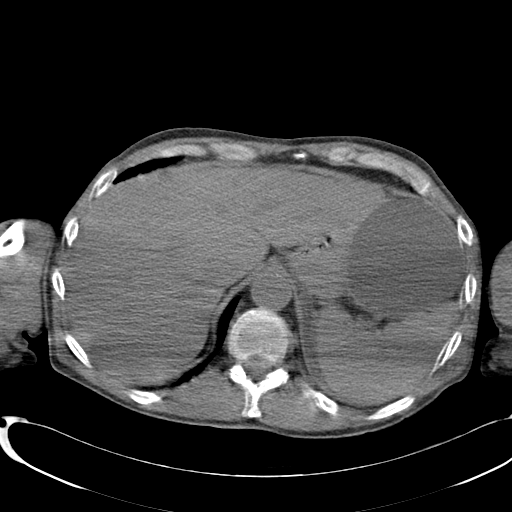
[im 155/170  soft-tissue]
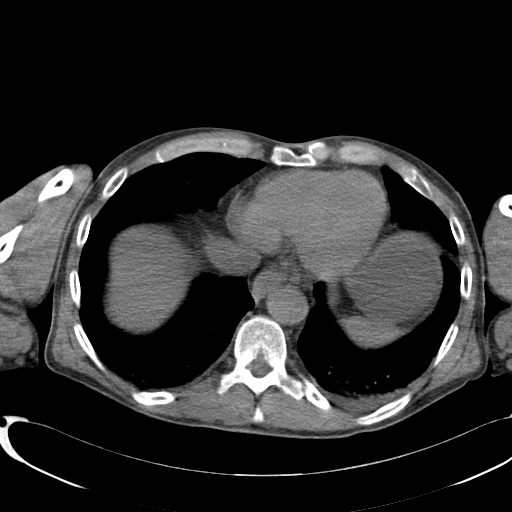

[11 of 16 positions shown; findings below may reference images not displayed]

FINDINGS: There is a trace left pleural effusion. There is bibasilar atelectasis.

There are numerous right renal calculi with the largest measuring 2.3 cm.
There is severe right hydronephrosis with a normal caliber ureter suggesting
a UPJ obstruction. There is severe left hydronephrosis a with renal cortical
thinning and perinephric stranding. There are punctate left renal calculi.
There is a nephroureteral stent present with the proximal portion of the
stent in the proximal ureter. There multiple foci of calcification along the
ureteral stent which may represent fractured calculi versus calcific
encrustation of the ureteral stent. Calcifications around the distal stent.
There is a Foley catheter present. There are numerous calculi within the
bladder.

The liver demonstrates no focal abnormality. The gallbladder is
unremarkable. The spleen demonstrates no focal abnormality. The adrenal
glands and pancreas are normal.

There is fecal impaction. There is no pneumoperitoneum, pneumatosis, or
portal venous gas. There is no abdominal or pelvic free fluid. There is no
lymphadenopathy.

The abdominal aorta is normal in caliber with atherosclerosis.

The osseous structures are unremarkable.
IMPRESSION: 1. There are numerous right renal calculi with the largest measuring 2.3 cm.

2. There is severe right hydronephrosis with a normal caliber ureter
suggesting a UPJ obstruction. There is severe left hydronephrosis a with
renal cortical thinning and perinephric stranding. There are punctate left
renal calculi. There is a nephroureteral stent present with the proximal
portion of the stent in the proximal ureter.

3. Fecal impaction.

[REDACTED]

## 2015-05-28 IMAGING — CR DG CHEST 1V PORT
1 series · 2 of 2 positions shown · non-contrast
Comparison: none

REASON FOR EXAM: ams
COMMENTS:

[Series 1: ap · 0.17mm/px · 2 of 2 slices shown]
[im 1/2]
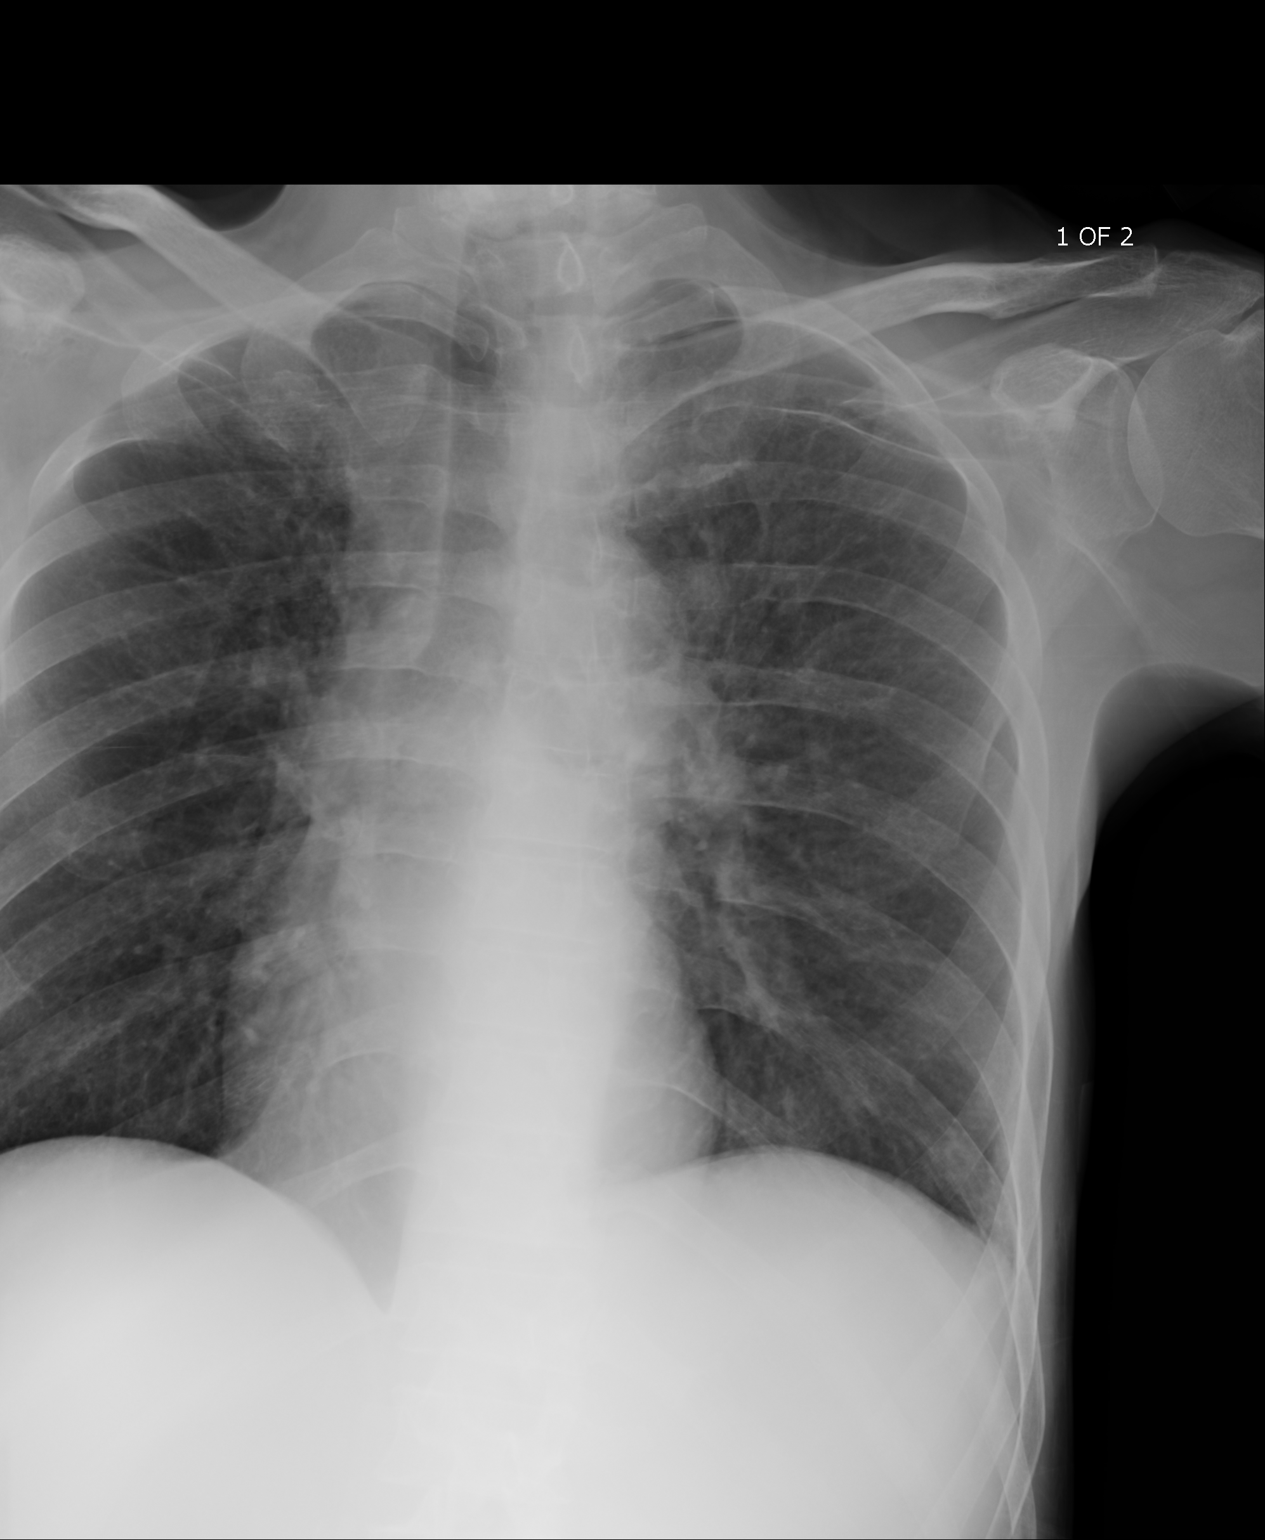
[im 2/2]
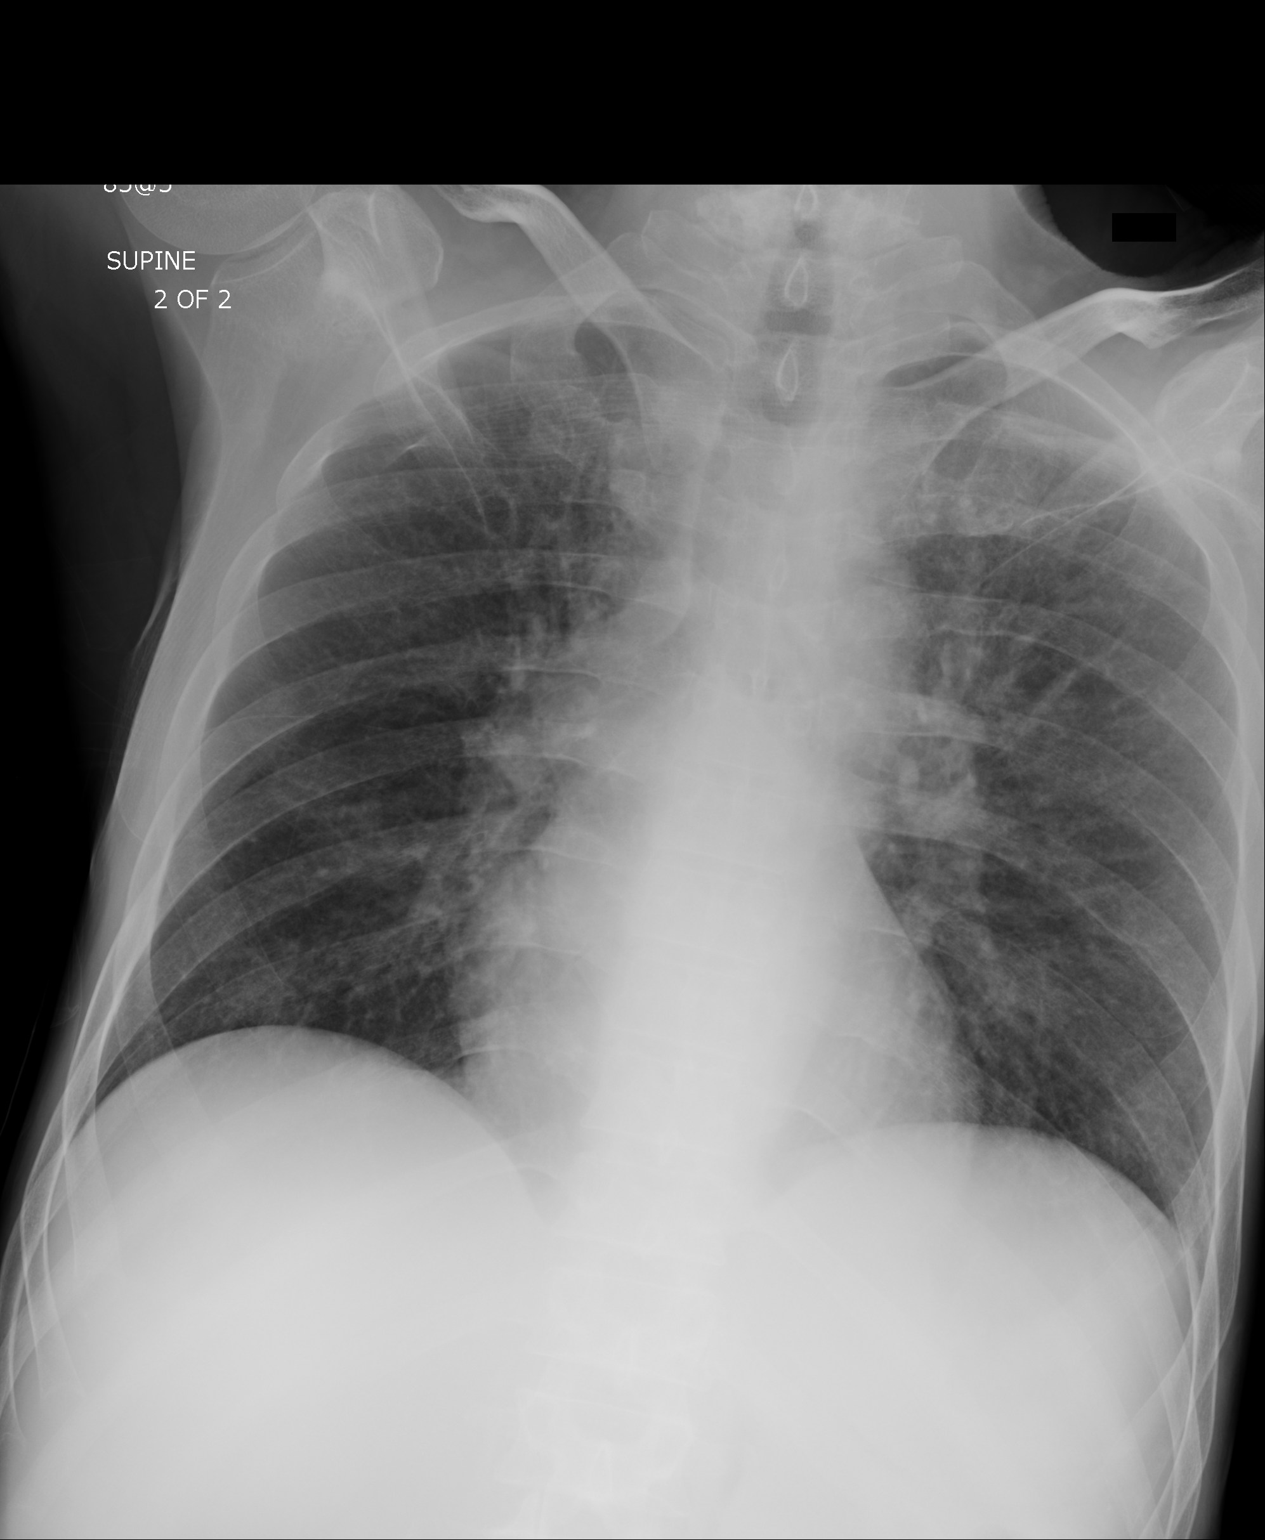

[2 of 2 positions shown; findings below may reference images not displayed]

PROCEDURE:     DXR - DXR PORTABLE CHEST SINGLE VIEW  - November 02, 2012  [DATE]

RESULT:     Comparison is made to the study September 17, 2012.

The lungs are well-expanded. There is no focal infiltrate. The cardiac
silhouette is normal in size. There is mild prominence of the mediastinum,
but the study was performed in the AP supine projection. There is no pleural
effusion or pneumothorax.
IMPRESSION: There is no definite evidence of acute cardiopulmonary
abnormality. There may be an element of underlying COPD.

[REDACTED]

## 2015-05-28 IMAGING — CT CT HEAD WITHOUT CONTRAST
3 series · 17 of 30 positions shown, 19 images · non-contrast
Comparison: none

REASON FOR EXAM: ams
COMMENTS:

PROCEDURE:     CT  - CT HEAD WITHOUT CONTRAST  - November 02, 2012  [DATE]
RESULT:     Comparison:  None
TECHNIQUE: Multiple axial images from the foramen magnum to the vertex were
obtained without IV contrast.

[Series 2: without · axial · non-contrast · 0.44mm/px · z∈[-622,-502]mm · 8 of 32 slices shown]
[im 4/32  brain]
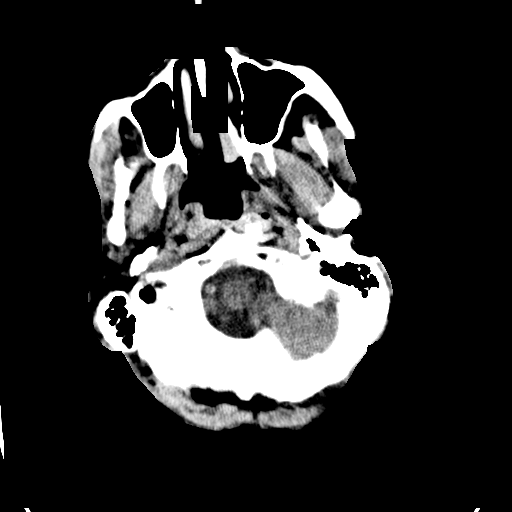
[im 7/32  brain]
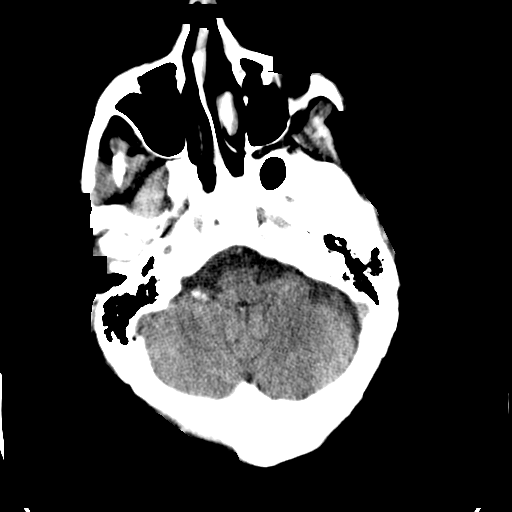
[im 11/32  brain]
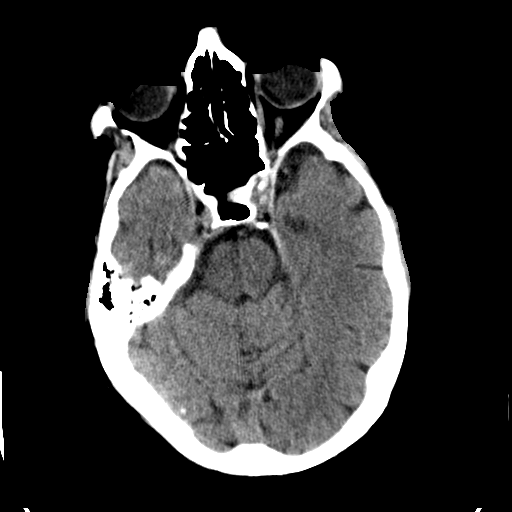
[im 14/32  brain]
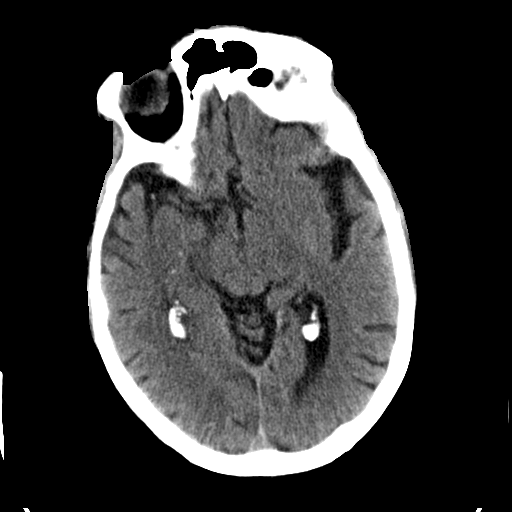
[im 18/32  brain]
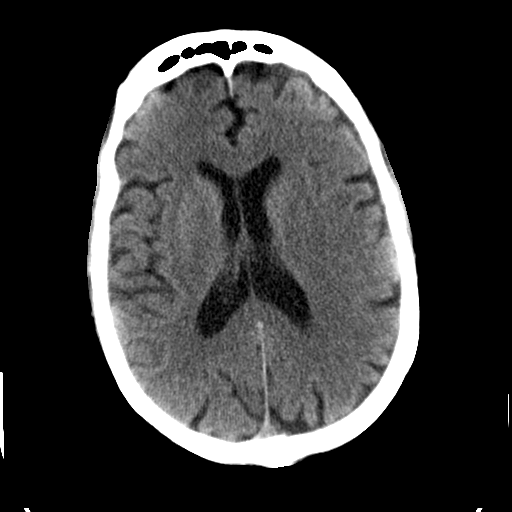
[im 21/32  brain]
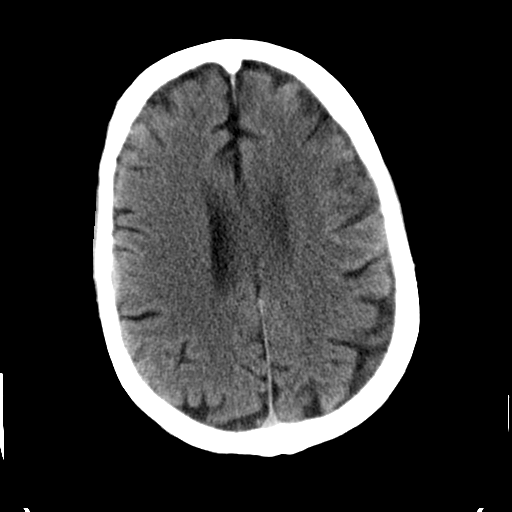
[im 25/32  brain]
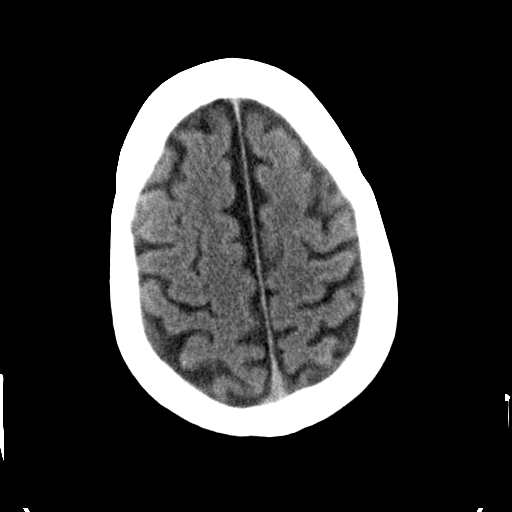
[im 28/32  brain]
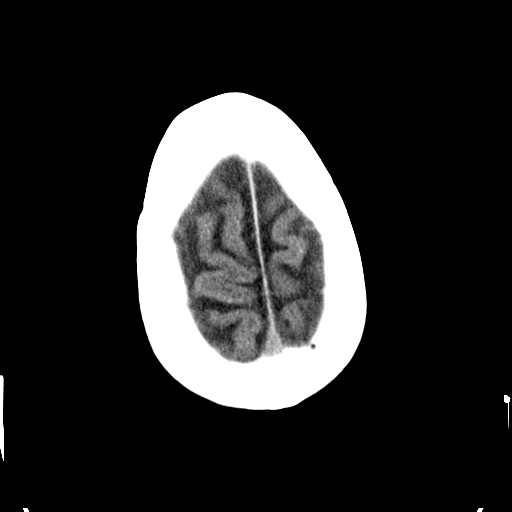

[Series 3: bone · axial · 0.44mm/px · 1 of 33 slices shown]
[im 4/33  bone]
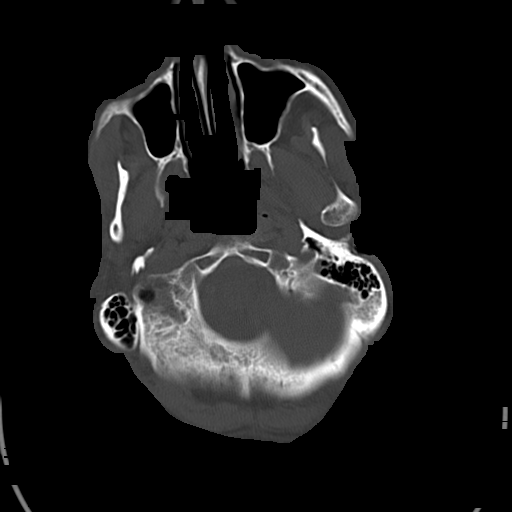

[Series 4: without recon · axial · non-contrast · 0.44mm/px · z∈[-600,-485]mm · 8 of 31 slices shown, 10 images]
[im 4/31  brain]
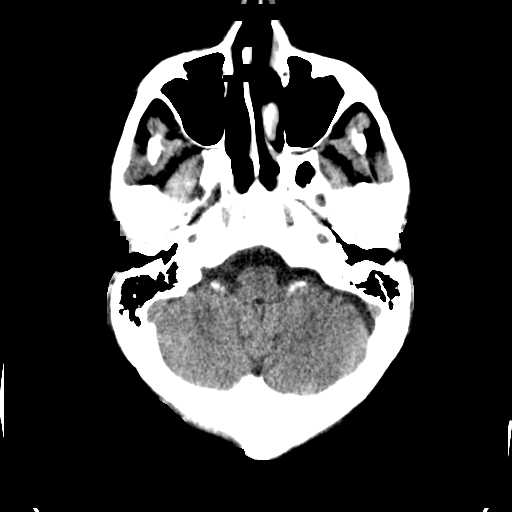
[im 4/31  bone]
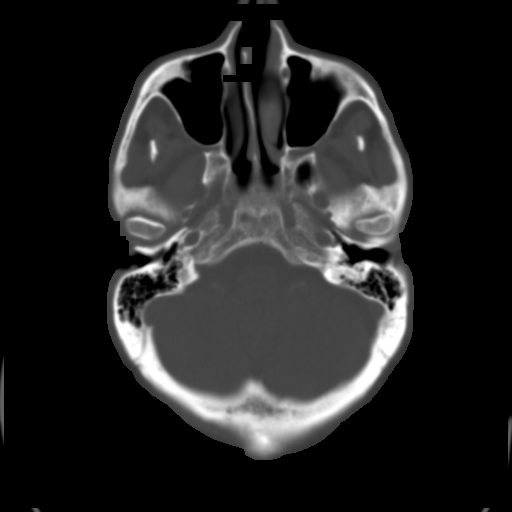
[im 7/31  brain]
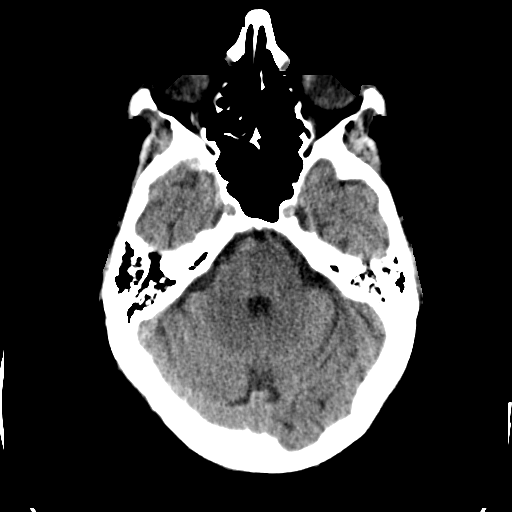
[im 11/31  brain]
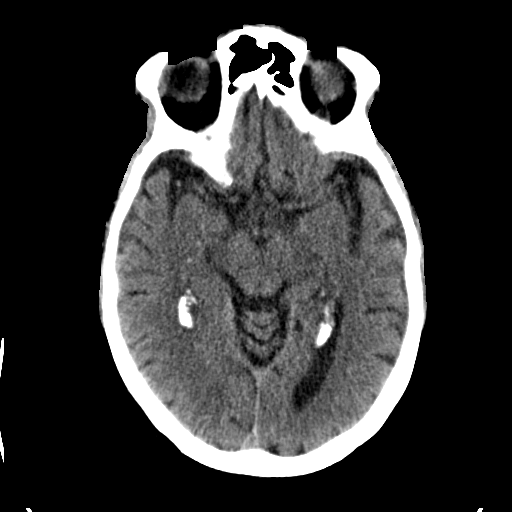
[im 14/31  brain]
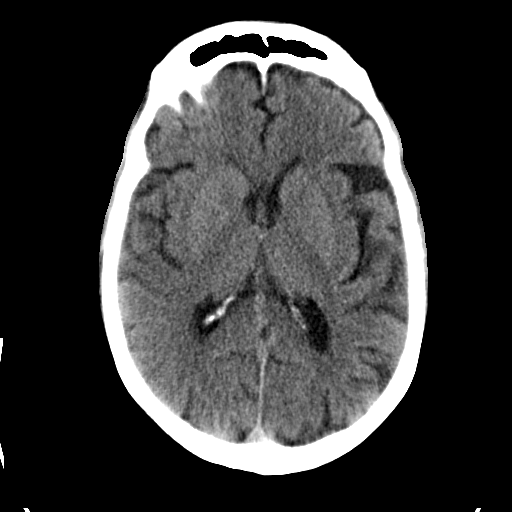
[im 17/31  brain]
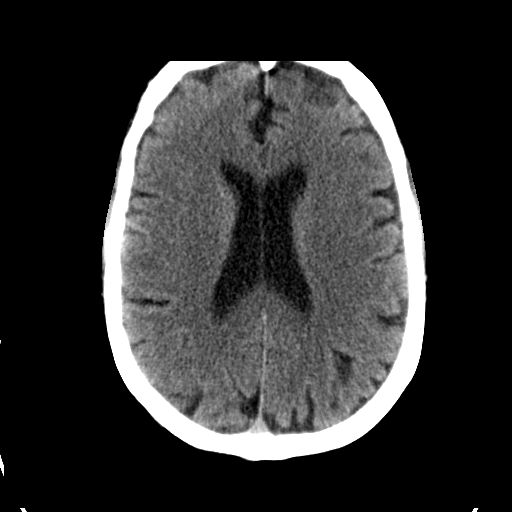
[im 17/31  bone]
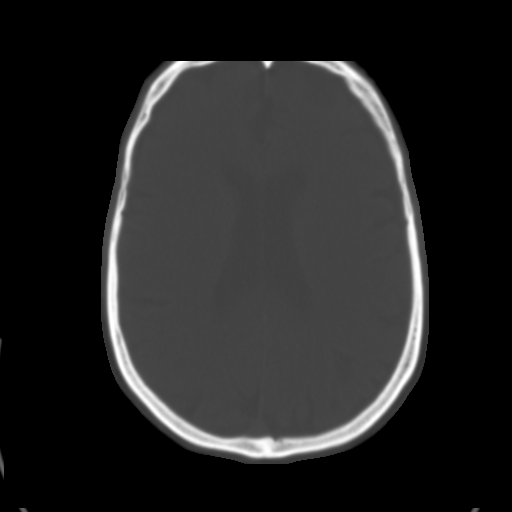
[im 21/31  brain]
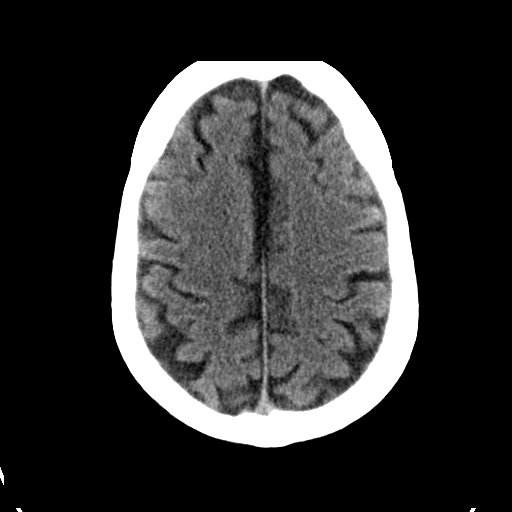
[im 24/31  brain]
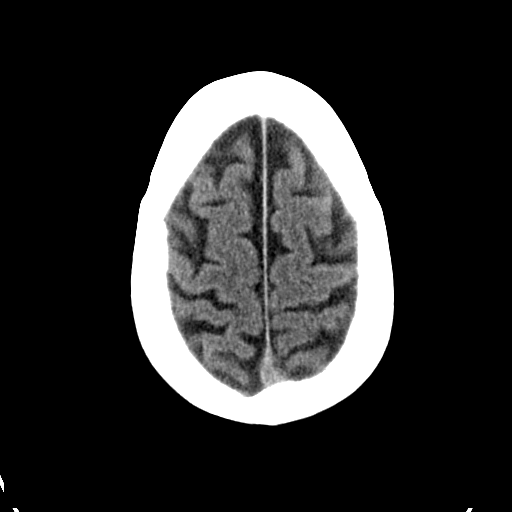
[im 27/31  brain]
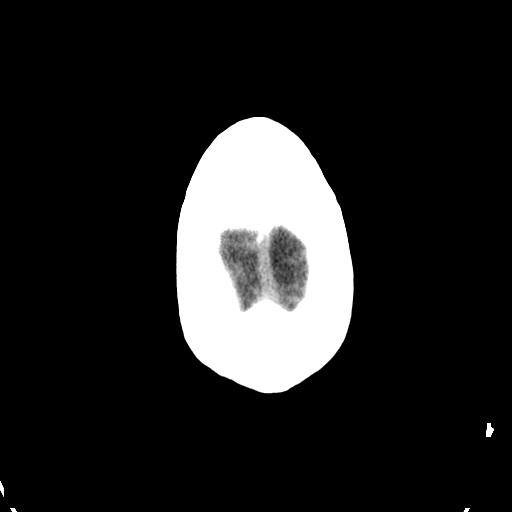

[17 of 30 positions shown; findings below may reference images not displayed]

FINDINGS: There is no evidence of mass effect, midline shift, or extra-axial fluid
collections.  There is no evidence of a space-occupying lesion or
intracranial hemorrhage. There is no evidence of a cortical-based area of
acute infarction. There is generalized cerebral atrophy.

The ventricles and sulci are appropriate for the patient's age. The basal
cisterns are patent.

Visualized portions of the orbits are unremarkable. The visualized portions
of the paranasal sinuses and mastoid air cells are unremarkable.

The osseous structures are unremarkable.
IMPRESSION: No acute intracranial process.

[REDACTED]

## 2015-05-29 IMAGING — US IR US GUIDANCE
1 series · 3 of 3 positions shown · non-contrast
Comparison: None available.

CLINICAL DATA: Bilateral hydronephrosis, elevated white blood cell count,
nonfunctioning ureteral stent.

PERC NEPHROSTOMY*L*,PERC NEPHROSTOMY*R*,IR ULTRASOUND GUIDANCE
TISSUE ABLATION
Fluoroscopy Time: 54 seconds

[Series 1: ir us guidance · 3 of 3 slices shown]
[im 1/3]
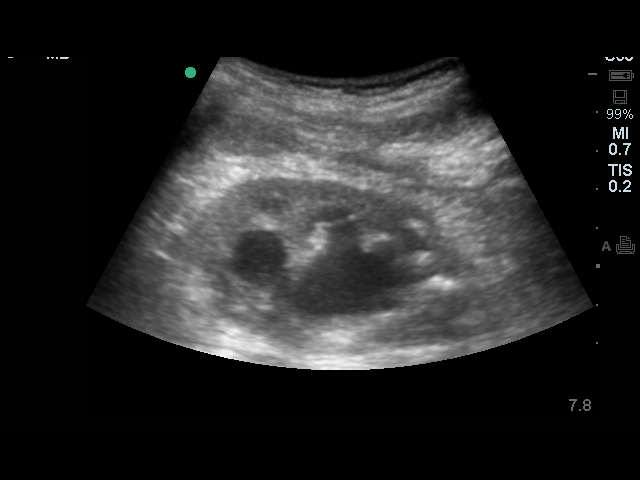
[im 2/3]
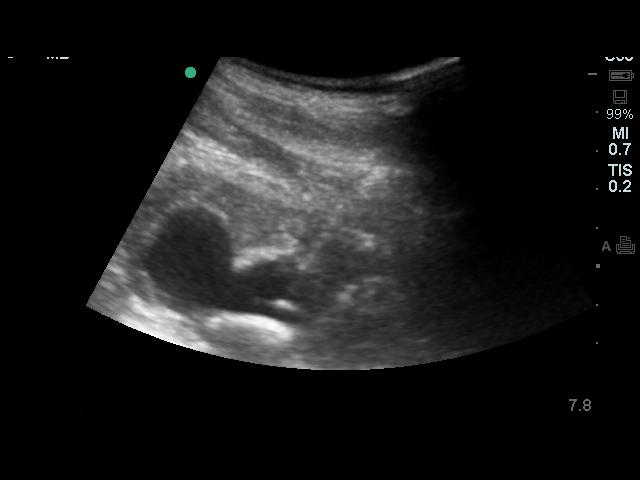
[im 3/3]
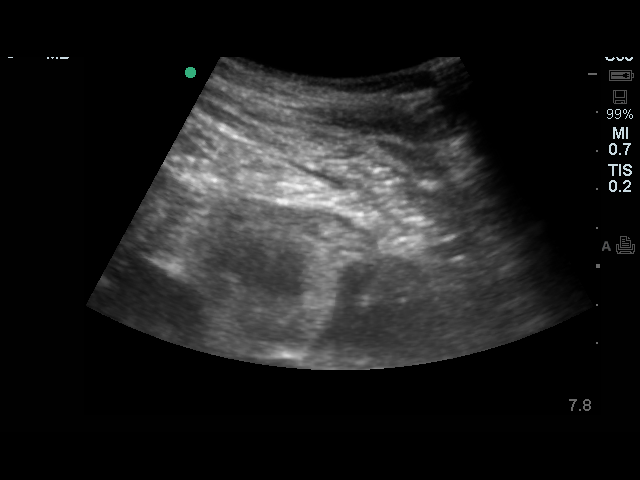

[3 of 3 positions shown; findings below may reference images not displayed]

Technique and findings:  The procedure, risks (including but not
limited to bleeding, infection, organ damage), benefits, and
alternatives were explained to the family.  Questions regarding the
procedure were encouraged and answered.  The family understands and
consents to the procedure.Bilateralflank regions prepped with
Betadine, draped in usual sterile fashion, infiltrated locally with
1% lidocaine.The patient was receiving adequate antibiotic
coverage.

Intravenous Fentanyl and Versed were administered as conscious
sedation during continuous cardiorespiratory monitoring by the
radiology RN, with a total moderate sedation time of 12 minutes.

 Under real-time ultrasound guidance, a 21-gauge trocar needle was
advanced into a posterior right lower pole calyx. Ultrasound image
documentation was saved. Urine spontaneously returned through the
needle. Needle was exchanged over a guidewire for transitional
dilator. Contrast injection confirmed appropriate positioning.
Catheter was exchanged over a guidewire for a 10 French pigtail
catheter, formed centrally within the right renal collecting
system. Contrast injection confirms appropriate positioning and
patency.

In similar fashion, under real-time ultrasound guidance, a 21-gauge
trocar needle was advanced into a posterior left lower pole calyx.
Ultrasound image documentation was saved. Urine spontaneously
returned through the needle. Needle was exchanged over a guidewire
for transitional dilator. Contrast injection confirmed appropriate
positioning. Catheter was exchanged over a guidewire for a 10
French pigtail catheter, formed centrally within the  left  renal
collecting system. Contrast injection confirms appropriate
positioning and patency.

Catheters   secured externally with 0 Prolene suture and Statlock
devices and placed to external drain bags. No immediate
complication.

IMPRESSION
Technically successful bilateral percutaneous nephrostomy catheter
placement.

## 2015-05-29 IMAGING — CR DG CHEST 1V PORT
1 series · 1 of 1 positions shown · non-contrast
Comparison: None

CLINICAL DATA: Follow up infiltrates

PORTABLE CHEST - 1 VIEW

[AP]
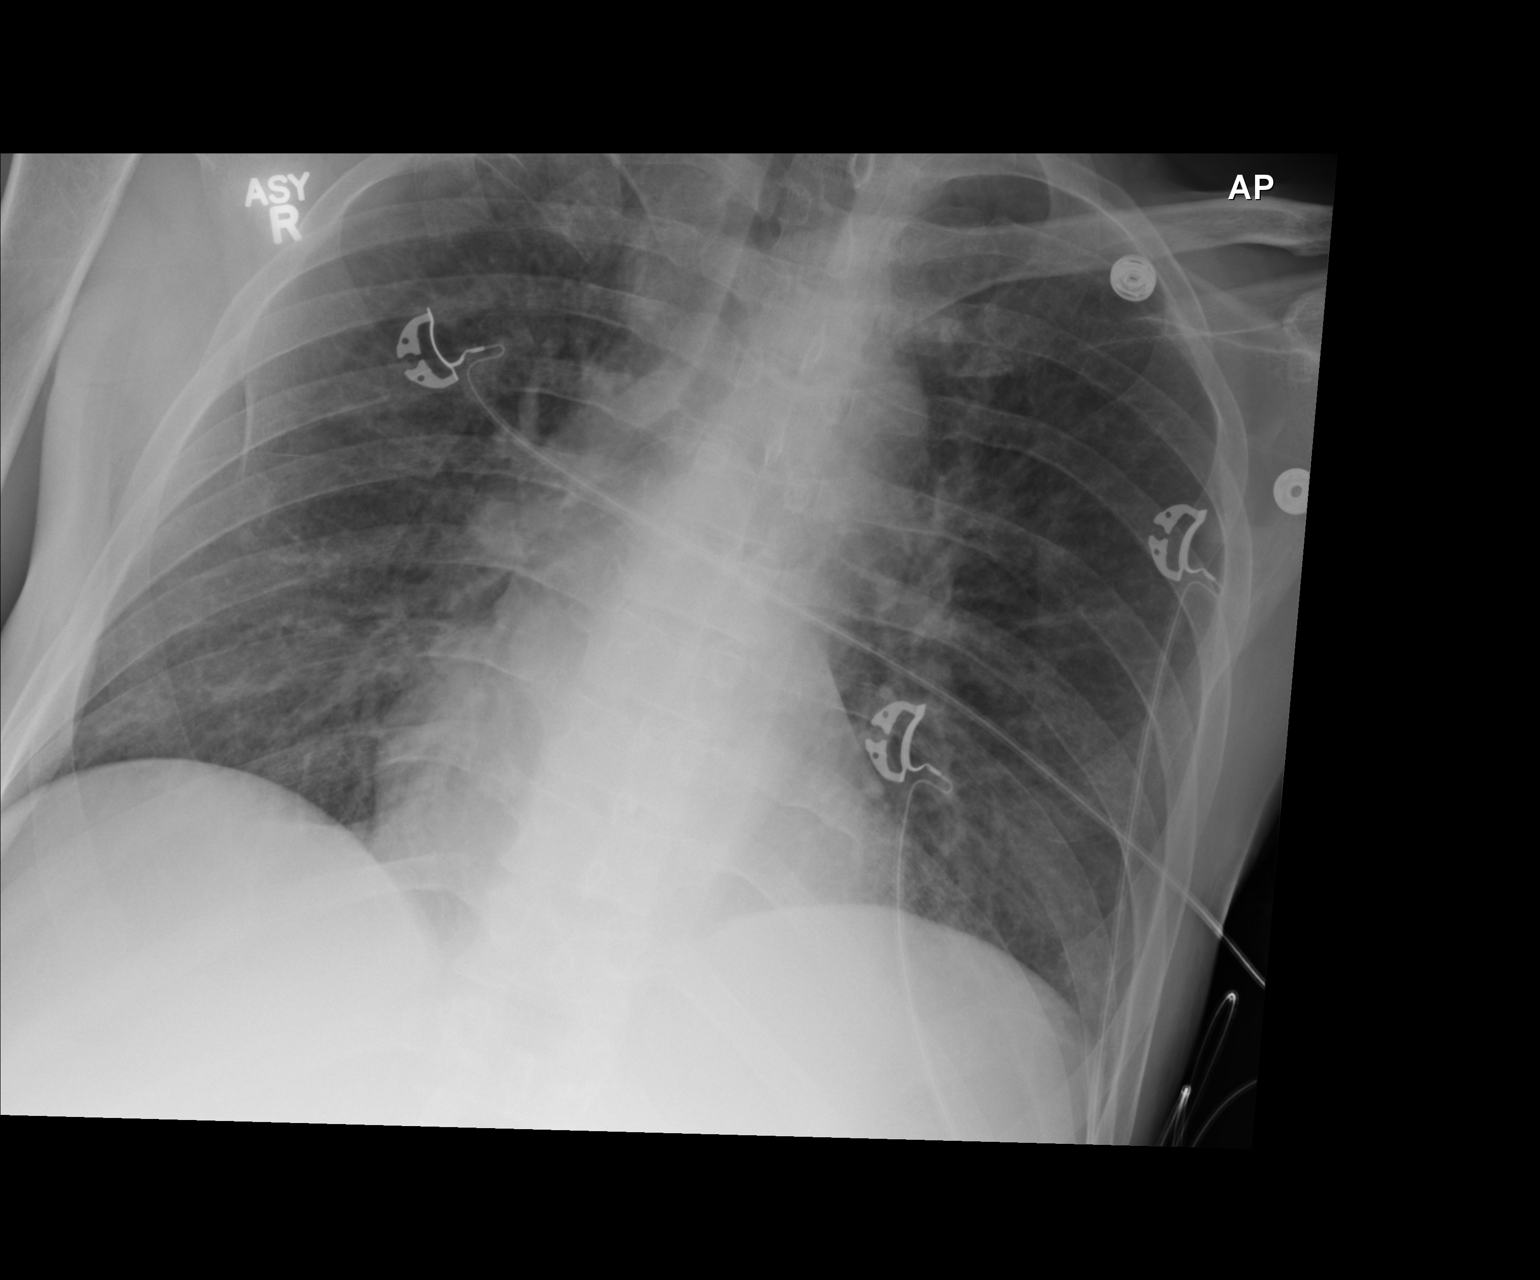

[1 of 1 positions shown; findings below may reference images not displayed]

FINDINGS: The heart size appears normal.  No pleural effusion
identified.  No airspace consolidation.  Mild to moderate pulmonary
vascular congestion is noted bilaterally.  The visualized bony
structures appear intact.
IMPRESSION: 1.  Pulmonary vascular congestion.

## 2015-06-01 IMAGING — CT CT ABD-PELV W/O CM
2 of 4 series · 14 of 46 positions shown, 16 images · non-contrast
Comparison: No priors.

CLINICAL DATA: Bilateral nephrostomy tubes placed.  Left side
draining significantly more than the right side.

CT ABDOMEN AND PELVIS WITHOUT CONTRAST
TECHNIQUE: Multidetector CT imaging of the abdomen and pelvis was
performed following the standard protocol without intravenous
contrast.

[Series 2: abd/pelv w/o 5.0 b31f st · axial · non-contrast · 0.72mm/px · z∈[-524,-150]mm · 11 of 91 slices shown, 13 images]
[im 8/91  soft-tissue]
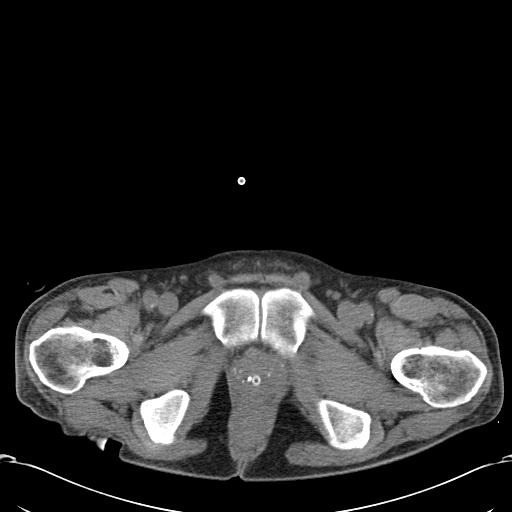
[im 8/91  bone]
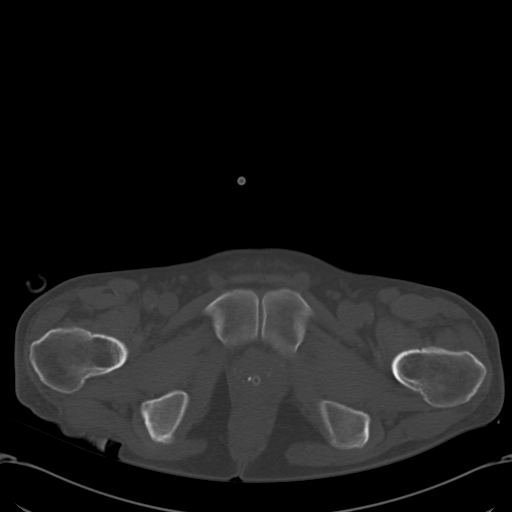
[im 15/91  soft-tissue]
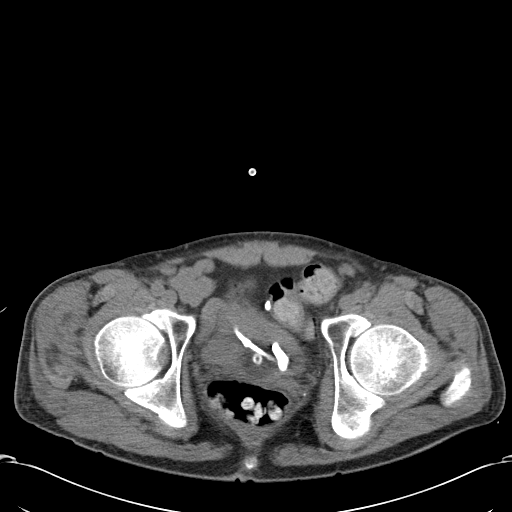
[im 22/91  soft-tissue]
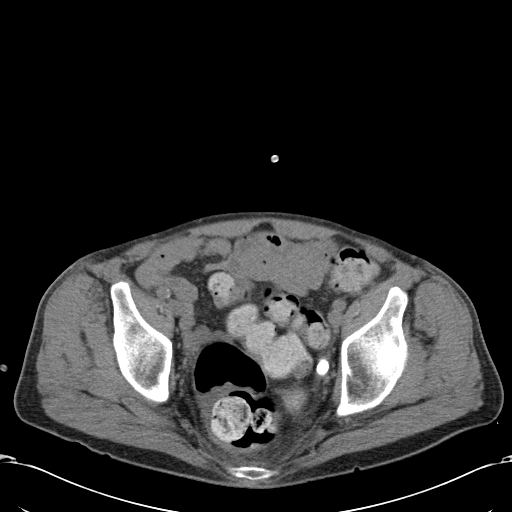
[im 29/91  soft-tissue]
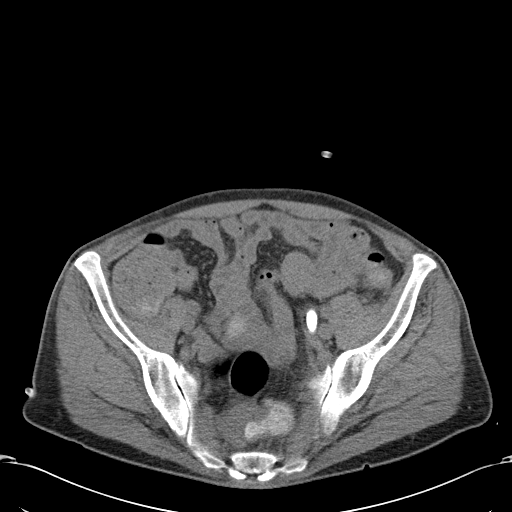
[im 37/91  soft-tissue]
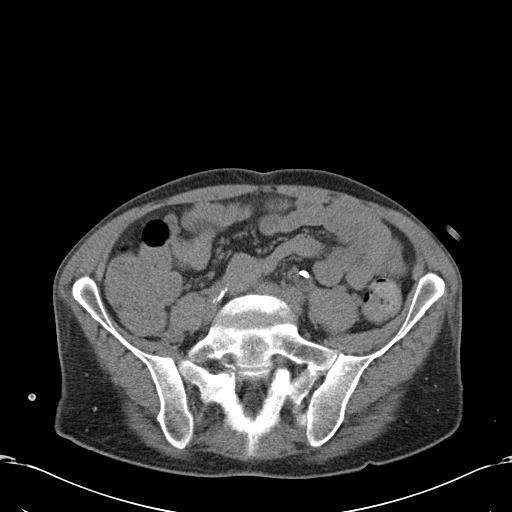
[im 47/91  soft-tissue]
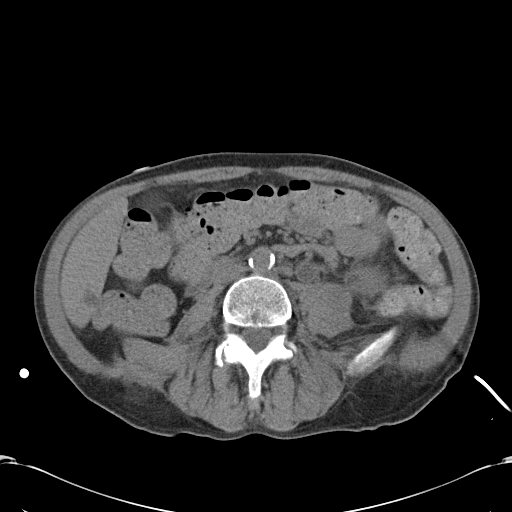
[im 55/91  soft-tissue]
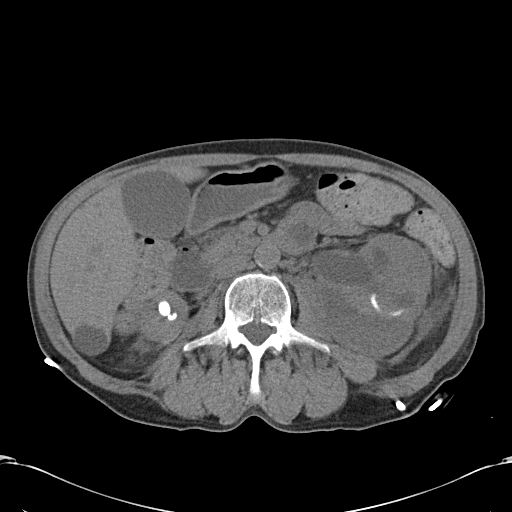
[im 62/91  soft-tissue]
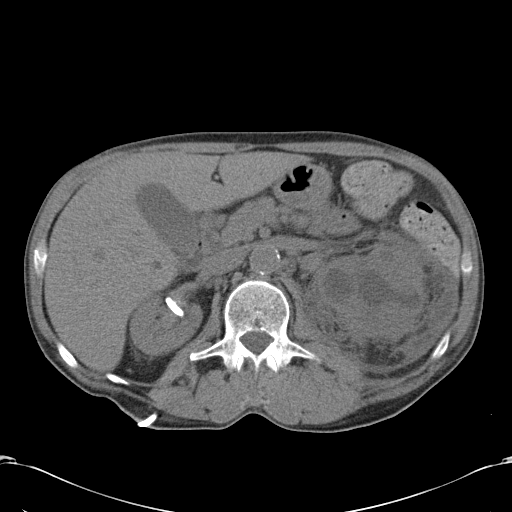
[im 69/91  soft-tissue]
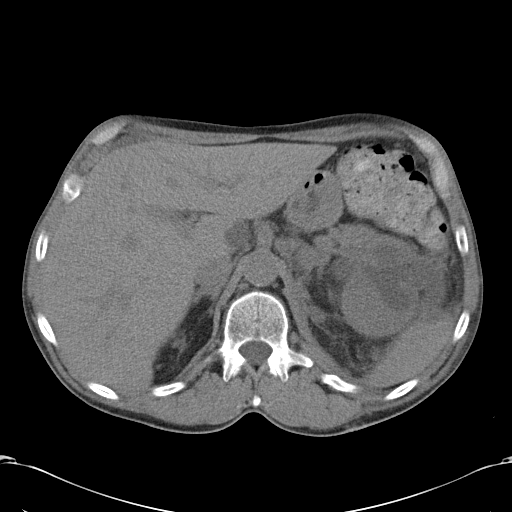
[im 69/91  bone]
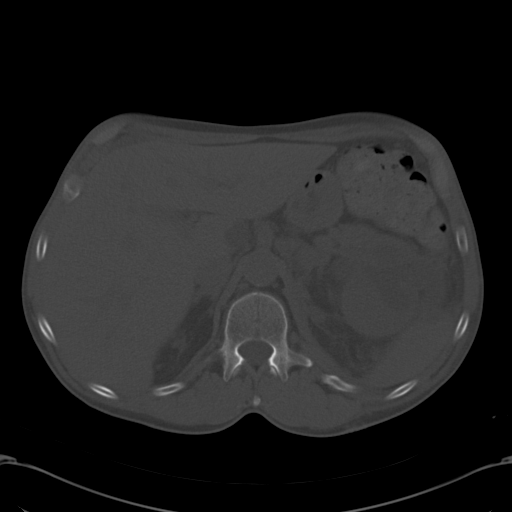
[im 76/91  soft-tissue]
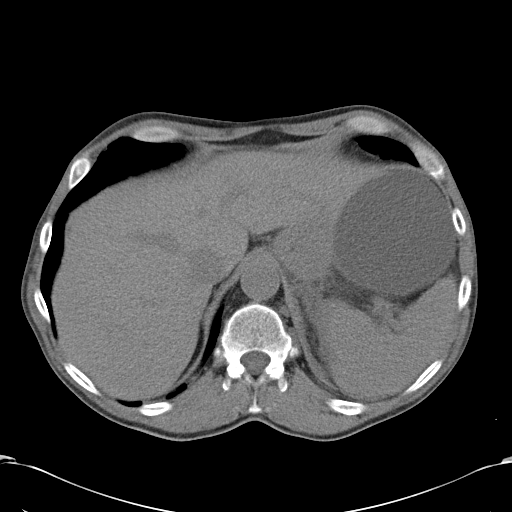
[im 83/91  soft-tissue]
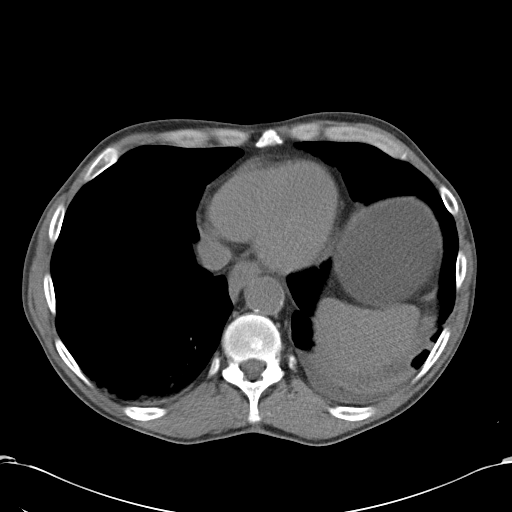

[Series 5: coronals · coronal · 0.69mm/px · 3 of 100 slices shown]
[im 34/100  soft-tissue]
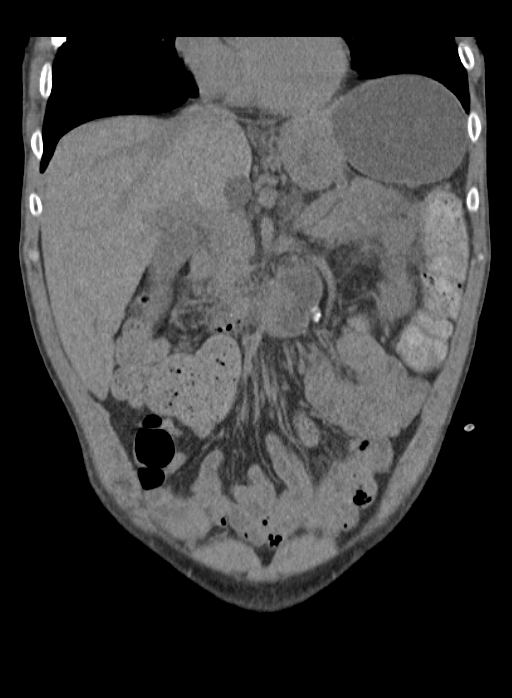
[im 45/100  soft-tissue]
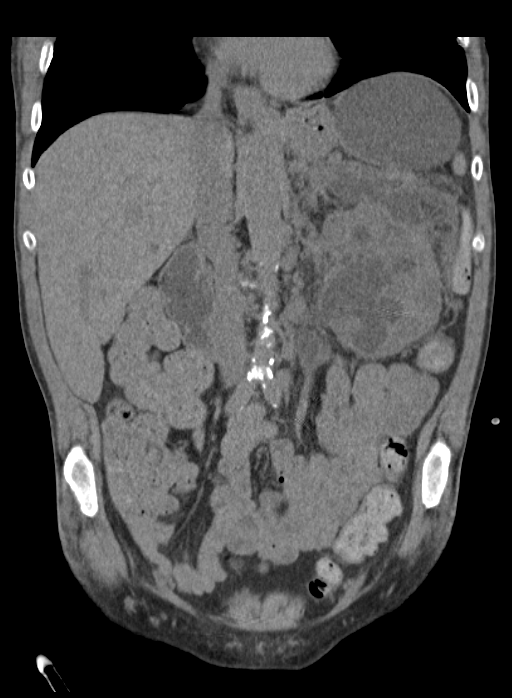
[im 56/100  soft-tissue]
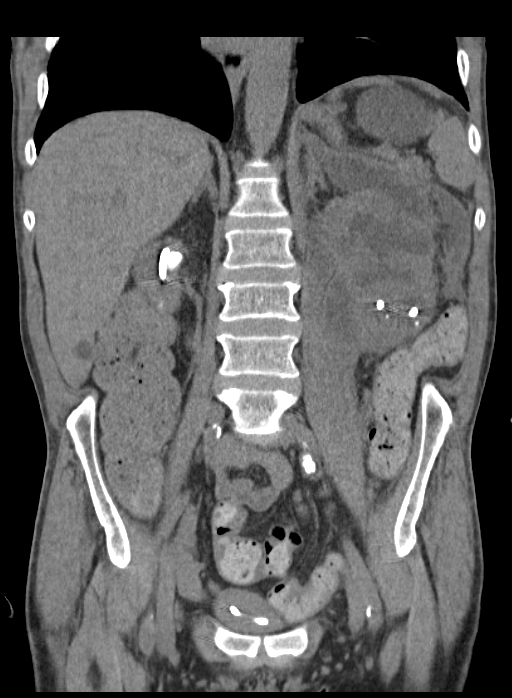

[14 of 46 positions shown; findings below may reference images not displayed]

FINDINGS: Lung Bases: A small left pleural effusion.  Dependent atelectasis
in the lower lobes of the lungs bilaterally.

Abdomen/Pelvis:  Bilateral percutaneous nephrostomy tubes are in
position.  The right tube appears properly located within the right
renal pelvis.  There is some high-density material within the right
renal pelvis measuring approximately 2.2 cm compatible with a large
calculus.  A small nonobstructive calculi are also noted in the
lower pole of the right kidney, largest of which is 10 mm in
diameter.  There is a small locule of gas in the right renal
collecting system, iatrogenic.  No right hydronephrosis.  Small
right-sided perinephric stranding. The right kidney is mildly
atrophic.  The left nephrostomy tube extends into the lower pole
collecting system of the left kidney and is slightly peripherally
located.  Tiny nonobstructive calculus measuring only 3 mm in the
interpolar collecting system of the left kidney.  There is severe
left-sided hydroureteronephrosis extensive left-sided perinephric
stranding.  Coronal images suggest probable forniceal rupture in
the upper pole (image 51 of series 5).  The hydroureteronephrosis
abruptly terminates in the mid left ureter where there is a heavily
calcified retained left ureteral stent which has migrated into the
mid and distal left ureter, extending into the pelvis.  The
ureteral portion of the stent is surrounded by a large amount of
high attenuation material, likely to be completely encrusted with
calculus.

Numerous low attenuation hepatic lesions are seen scattered
throughout the hepatic parenchyma.  The largest of these is in
segment 1 measuring 2.5 x 2.1 cm.  These are incompletely
characterized on this noncontrast CT examination, but favored to
represent cysts.  The unenhanced appearance of the gallbladder,
pancreas, spleen and bilateral adrenal glands is unremarkable.  No
significant volume of ascites.  No pneumoperitoneum.  No pathologic
distension of small bowel.  A significant atherosclerosis is noted
throughout the abdominal and pelvic vasculature, without definite
aneurysm.  The numerous calcifications in the prostate gland.
Foley balloon catheter in place within the lumen of the urinary
bladder.

Musculoskeletal: There are no aggressive appearing lytic or blastic
lesions noted in the visualized portions of the skeleton.
IMPRESSION: 1.  The patient's old left double-J ureteral stent has partially
migrated into the pelvis, occupying only the distal half of the
left ureter.  The remaining portion of the stent within the distal
half of the left ureter is heavily encrusted with calculus,
resulting in severe left ureteral obstruction, with severe left-
sided hydroureteronephrosis and extensive left-sided perinephric
stranding, likely related to upper pole forniceal rupture.  The
left sided nephrostomy tube appears slightly peripherally located
in the lower pole collecting system of the left kidney.  Clinical
correlation for proper function of the left nephrostomy tube is
suggested, as this degree of obstruction is unusual in the presence
of a properly functioning nephrostomy tube.
2.  Right-sided nephrostomy tube appears properly located.  No
right sided hydroureteronephrosis.
3.  Nonobstructive calculi within the collecting systems of the
kidneys bilaterally.
4.  Small left pleural effusion with minimal bibasilar subsegmental
atelectasis in the lower lobes of the lungs bilaterally.
5.  Multiple low-attenuation hepatic lesions incompletely
characterized on today's noncontrast CT examination.  These are
favored to represent cysts, and could be further characterized with
contrast enhanced CT or ultrasound.
5.  Atherosclerosis.

## 2015-07-13 ENCOUNTER — Emergency Department
Admission: EM | Admit: 2015-07-13 | Discharge: 2015-07-13 | Discharge status: Against Medical Advice | Payer: Medicare Other | Attending: Emergency Medicine | Admitting: Emergency Medicine

## 2015-07-13 ENCOUNTER — Encounter: Payer: Self-pay | Admitting: Emergency Medicine

## 2015-07-13 DIAGNOSIS — R319 Hematuria, unspecified: Secondary | ICD-10-CM

## 2015-07-13 DIAGNOSIS — E871 Hypo-osmolality and hyponatremia: Secondary | ICD-10-CM | POA: Diagnosis not present

## 2015-07-13 DIAGNOSIS — Z79899 Other long term (current) drug therapy: Secondary | ICD-10-CM | POA: Diagnosis not present

## 2015-07-13 DIAGNOSIS — F17210 Nicotine dependence, cigarettes, uncomplicated: Secondary | ICD-10-CM | POA: Insufficient documentation

## 2015-07-13 DIAGNOSIS — N390 Urinary tract infection, site not specified: Secondary | ICD-10-CM | POA: Diagnosis not present

## 2015-07-13 DIAGNOSIS — Z7951 Long term (current) use of inhaled steroids: Secondary | ICD-10-CM | POA: Diagnosis not present

## 2015-07-13 LAB — CBC WITH DIFFERENTIAL/PLATELET
BASOS ABS: 0.1 10*3/uL (ref 0–0.1)
Basophils Relative: 1 %
Eosinophils Absolute: 0.4 10*3/uL (ref 0–0.7)
Eosinophils Relative: 2 %
HEMATOCRIT: 34.7 % — AB (ref 40.0–52.0)
Hemoglobin: 11.6 g/dL — ABNORMAL LOW (ref 13.0–18.0)
LYMPHS PCT: 9 %
Lymphs Abs: 1.5 10*3/uL (ref 1.0–3.6)
MCH: 30 pg (ref 26.0–34.0)
MCHC: 33.3 g/dL (ref 32.0–36.0)
MCV: 90.2 fL (ref 80.0–100.0)
MONO ABS: 1.3 10*3/uL — AB (ref 0.2–1.0)
MONOS PCT: 8 %
NEUTROS ABS: 13.9 10*3/uL — AB (ref 1.4–6.5)
Neutrophils Relative %: 80 %
Platelets: 168 10*3/uL (ref 150–440)
RBC: 3.85 MIL/uL — ABNORMAL LOW (ref 4.40–5.90)
RDW: 15.1 % — AB (ref 11.5–14.5)
WBC: 17.2 10*3/uL — ABNORMAL HIGH (ref 3.8–10.6)

## 2015-07-13 LAB — PROTIME-INR
INR: 1.09
Prothrombin Time: 14.3 seconds (ref 11.4–15.0)

## 2015-07-13 LAB — BASIC METABOLIC PANEL
Anion gap: 7 (ref 5–15)
BUN: 29 mg/dL — AB (ref 6–20)
CALCIUM: 8.5 mg/dL — AB (ref 8.9–10.3)
CO2: 21 mmol/L — ABNORMAL LOW (ref 22–32)
Chloride: 104 mmol/L (ref 101–111)
Creatinine, Ser: 1.97 mg/dL — ABNORMAL HIGH (ref 0.61–1.24)
GFR calc Af Amer: 41 mL/min — ABNORMAL LOW (ref >60)
GFR, EST NON AFRICAN AMERICAN: 35 mL/min — AB (ref >60)
GLUCOSE: 129 mg/dL — AB (ref 65–99)
POTASSIUM: 3.8 mmol/L (ref 3.5–5.1)
SODIUM: 132 mmol/L — AB (ref 135–145)

## 2015-07-13 LAB — COMPREHENSIVE METABOLIC PANEL
ALK PHOS: 80 U/L (ref 38–126)
ALT: 18 U/L (ref 17–63)
AST: 23 U/L (ref 15–41)
Albumin: 3 g/dL — ABNORMAL LOW (ref 3.5–5.0)
Anion gap: 8 (ref 5–15)
BUN: 30 mg/dL — AB (ref 6–20)
CALCIUM: 8.7 mg/dL — AB (ref 8.9–10.3)
CO2: 22 mmol/L (ref 22–32)
CREATININE: 2.03 mg/dL — AB (ref 0.61–1.24)
Chloride: 96 mmol/L — ABNORMAL LOW (ref 101–111)
GFR calc non Af Amer: 34 mL/min — ABNORMAL LOW (ref >60)
GFR, EST AFRICAN AMERICAN: 39 mL/min — AB (ref >60)
GLUCOSE: 107 mg/dL — AB (ref 65–99)
Potassium: 4 mmol/L (ref 3.5–5.1)
SODIUM: 126 mmol/L — AB (ref 135–145)
Total Bilirubin: 0.3 mg/dL (ref 0.3–1.2)
Total Protein: 6.6 g/dL (ref 6.5–8.1)

## 2015-07-13 LAB — URINALYSIS COMPLETE WITH MICROSCOPIC (ARMC ONLY)
BILIRUBIN URINE: NEGATIVE
GLUCOSE, UA: NEGATIVE mg/dL
KETONES UR: NEGATIVE mg/dL
Nitrite: NEGATIVE
PROTEIN: 30 mg/dL — AB
Specific Gravity, Urine: 1.006 (ref 1.005–1.030)
pH: 7 (ref 5.0–8.0)

## 2015-07-13 MED ORDER — SODIUM CHLORIDE 0.9 % IV BOLUS (SEPSIS)
500.0000 mL | Freq: Once | INTRAVENOUS | Status: AC
  Administered 2015-07-13: 500 mL via INTRAVENOUS

## 2015-07-13 MED ORDER — LEVOFLOXACIN 750 MG PO TABS: 750.0000 mg | ORAL_TABLET | Freq: Every day | ORAL | Status: DC

## 2015-07-13 MED ORDER — SODIUM CHLORIDE 0.9 % IV SOLN: Freq: Once | INTRAVENOUS | Status: DC

## 2015-07-13 MED ORDER — SODIUM CHLORIDE 0.9 % IV BOLUS (SEPSIS)
1000.0000 mL | Freq: Once | INTRAVENOUS | Status: AC
  Administered 2015-07-13: 1000 mL via INTRAVENOUS

## 2015-07-13 MED ORDER — LIDOCAINE HCL 2 % EX GEL
1.0000 "application " | Freq: Once | CUTANEOUS | Status: AC
  Administered 2015-07-13: 1 via URETHRAL
  Filled 2015-07-13: qty 5

## 2015-07-13 MED ORDER — LEVOFLOXACIN IN D5W 750 MG/150ML IV SOLN
750.0000 mg | Freq: Once | INTRAVENOUS | Status: AC
  Administered 2015-07-13: 750 mg via INTRAVENOUS
  Filled 2015-07-13: qty 150

## 2015-07-13 NOTE — ED Notes (Signed)
Pt to ed with c/o blood in urine x 3 days.  Pt reports he has an indwelling catheter for several years and recently has noticed blood coming out of penis from around the catheter.  Pt reports pain in penile area.

## 2015-07-13 NOTE — ED Notes (Signed)
Patient request food tray. MD McShane requested salty foods. Pt was given chips, saltines, and [place] sandwich.

## 2015-07-13 NOTE — Discharge Instructions (Signed)
Catheter-Associated Urinary Tract Infection FAQs  What is "catheter-associated urinary tract infection"?  A urinary tract infection (also called "UTI") is an infection in the urinary system, which includes the bladder (which stores the urine) and the kidneys (which filter the blood to make urine). Germs (for example, bacteria or yeasts) do not normally live in these areas; but if germs are introduced, an infection can occur.  If you have a urinary catheter, germs can travel along the catheter and cause an infection in your bladder or your kidney; in that case it is called a catheter-associated urinary tract infection (or "CA-UTI").   What is a urinary catheter?  A urinary catheter is a thin tube placed in the bladder to drain urine. Urine drains through the tube into a bag that collects the urine. A urinary catheter may be used:  · If you are not able to urinate on your own  · To measure the amount of urine that you make, for example, during intensive care  · During and after some types of surgery  · During some tests of the kidneys and bladder  People with urinary catheters have a much higher chance of getting a urinary tract infection than people who don't have a catheter.  How do I get a catheter-associated urinary tract infection (CA-UTI)?  If germs enter the urinary tract, they may cause an infection. Many of the germs that cause a catheter-associated urinary tract infection are common germs found in your intestines that do not usually cause an infection there. Germs can enter the urinary tract when the catheter is being put in or while the catheter remains in the bladder.   What are the symptoms of a urinary tract infection?  Some of the common symptoms of a urinary tract infection are:  · Burning or pain in the lower abdomen (that is, below the stomach)  · Fever  · Bloody urine may be a sign of infection, but is also caused by other problems  · Burning during urination or an increase in the frequency of  urination after the catheter is removed.  Sometimes people with catheter-associated urinary tract infections do not have these symptoms of infection.  Can catheter-associated urinary tract infections be treated?  Yes, most catheter-associated urinary tract infections can be treated with antibiotics and removal or change of the catheter. Your doctor will determine which antibiotic is best for you.   What are some of the things that hospitals are doing to prevent catheter-associated urinary tract infections?  To prevent urinary tract infections, doctors and nurses take the following actions.   Catheter insertion  · External catheters in men (these look like condoms and are placed over the penis rather than into the penis)  · Putting a temporary catheter in to drain the urine and removing it right away. This is called intermittent urethral catheterization.  Catheter care  What can I do to help prevent catheter-associated urinary tract infections if I have a catheter?  · Always clean your hands before and after doing catheter care.  · Always keep your urine bag below the level of your bladder.  · Do not tug or pull on the tubing.  · Do not twist or kink the catheter tubing.  · Ask your healthcare provider each day if you still need the catheter.  What do I need to do when I go home from the hospital?  · If you will be going home with a catheter, your doctor or nurse should explain everything   you need to know about taking care of the catheter. Make sure you understand how to care for it before you leave the hospital.  · If you develop any of the symptoms of a urinary tract infection, such as burning or pain in the lower abdomen, fever, or an increase in the frequency of urination, contact your doctor or nurse immediately.  · Before you go home, make sure you know who to contact if you have questions or problems after you get home.  If you have questions, please ask your doctor or nurse.  Developed and co-sponsored by The  Society for Healthcare Epidemiology of America (SHEA); Infectious Diseases Society of America (IDSA); [employer] Hospital Association; Association for Professionals in Infection Control and Epidemiology (APIC); Centers for Disease Control and Prevention (CDC); and The Joint Commission.     This information is not intended to replace advice given to you by your health care provider. Make sure you discuss any questions you have with your health care provider.     Document Released: 02/11/2012 Document Revised: 10/03/2014 Document Reviewed: 08/02/2014  [employer] Interactive Patient Education ©2016 [employer]

## 2015-07-13 NOTE — ED Provider Notes (Addendum)
[facility] Provider Note  ____________________________________________   I have reviewed the triage vital signs and the nursing notes.   HISTORY  Chief Complaint Hematuria    HPI TRAI Alan [facility] is a 60 y.o. male presents today complaining of hematuria. Patient has had an indwelling catheter for several years. They changed every month. They changed on February 4. Shortly thereafter, he started noticing some hematuria. Patient states it was a painful placement of the Foley and ways that it is not normally painful. He states he has had some residual discomfort from that time. He denies any fever or chills. He denies any flank pain. He just states that there is some blood noted in his urine.  Past Medical History  Diagnosis Date  . Mental disorder   . Seizures ([facility])   . Myocardial infarction ([facility])   . Stroke ([facility])   . Asthma   . Paranoid schizophrenia ([facility])   . Gastric ulcer   . External hemorrhoids   . Hypothyroidism     hypo  . Insomnia   . Depression   . Renal disorder     renal failure  . Hydronephrosis     Patient Active Problem List   Diagnosis Date Noted  . Malnutrition of moderate degree 05/09/2015  . Sepsis ([facility]) 05/06/2015  . UTI (lower urinary tract infection) 01/17/2015  . Hypoglycemia 01/17/2015  . Metabolic acidosis 0000000  . Acute renal failure ([facility]) 11/03/2012  . Hyperkalemia 11/03/2012  . Pyelonephritis 11/03/2012  . Schizophrenia ([facility]) 11/03/2012  . Hypothyroidism 11/03/2012  . Severe sepsis ([place]) 11/03/2012  . Protein-calorie malnutrition, severe ([facility]) 11/03/2012  . Encephalopathy acute 11/03/2012    Past Surgical History  Procedure Laterality Date  . Appendectomy    . Colonoscopy with propofol N/A 12/07/2014    Procedure: COLONOSCOPY WITH PROPOFOL;  Surgeon: Manya Silvas, MD;  Location: [facility] ENDOSCOPY;  Service: Endoscopy;  Laterality: N/A;  . Esophagogastroduodenoscopy N/A 12/07/2014     Procedure: ESOPHAGOGASTRODUODENOSCOPY (EGD);  Surgeon: Manya Silvas, MD;  Location: [facility] ENDOSCOPY;  Service: Endoscopy;  Laterality: N/A;  . Nephrectomy      Current Outpatient Rx  Name  Route  Sig  Dispense  Refill  . albuterol-ipratropium (COMBIVENT) 18-103 MCG/ACT inhaler   Inhalation   Inhale 2 puffs into the lungs every 6 (six) hours as needed for wheezing.         . feeding supplement, ENSURE ENLIVE, (ENSURE ENLIVE) LIQD   Oral   Take 237 mLs by mouth 2 (two) times daily with a meal.   237 mL   12   . FLUoxetine (PROZAC) 40 MG capsule   Oral   Take 40 mg by mouth daily.         . Fluticasone-Salmeterol (ADVAIR) 250-50 MCG/DOSE AEPB   Inhalation   Inhale 1 puff into the lungs 2 (two) times daily.         . hydrocortisone (ANUSOL-HC) 2.5 % rectal cream   Rectal   Place rectally 2 (two) times daily.   30 g   0   . levothyroxine (SYNTHROID, LEVOTHROID) 50 MCG tablet   Oral   Take 50 mcg by mouth daily before breakfast.         . opium-belladonna (B&O SUPPRETTES) 16.2-60 MG suppository   Rectal   Place 1 suppository rectally every 6 (six) hours as needed for bladder spasms.   12 suppository   0   . risperiDONE (RISPERDAL) 3 MG tablet   Oral  Take 1 tablet (3 mg total) by mouth at bedtime.   20 tablet   0   . solifenacin (VESICARE) 5 MG tablet   Oral   Take 5 mg by mouth daily.         . tamsulosin (FLOMAX) 0.4 MG CAPS capsule   Oral   Take 0.4 mg by mouth daily.           Allergies Mellaril; Morphine and related; Navane; and Thorazine  Family History  Problem Relation Age of Onset  . Stroke Father   . Pneumonia Father   . Brain cancer Mother     Social History Social History  Substance Use Topics  . Smoking status: Current Every Day Smoker -- 1.00 packs/day for 55 years    Types: Cigarettes  . Smokeless tobacco: Former Systems developer    Types: Chew  . Alcohol Use: No    Review of Systems Constitutional: No fever/chills Eyes: No  visual changes. ENT: No sore throat. No stiff neck no neck pain Cardiovascular: Denies chest pain. Respiratory: Denies shortness of breath. Gastrointestinal:   no vomiting.  No diarrhea.  No constipation. Genitourinary: See history of present illness Musculoskeletal: Negative lower extremity swelling Skin: Negative for rash. Neurological: Negative for headaches, focal weakness or numbness. 10-point ROS otherwise negative.  ____________________________________________   PHYSICAL EXAM:  VITAL SIGNS: ED Triage Vitals  Enc Vitals Group     BP 07/13/15 1514 121/74 mmHg     Pulse Rate 07/13/15 1514 88     Resp 07/13/15 1514 18     Temp 07/13/15 1514 98.2 F (36.8 C)     Temp Source 07/13/15 1514 Oral     SpO2 07/13/15 1514 97 %     Weight 07/13/15 1514 156 lb (70.761 kg)     Height 07/13/15 1514 6' (1.829 m)     Head Cir --      Peak Flow --      Pain Score 07/13/15 1514 8     Pain Loc --      Pain Edu? --      Excl. in [place]? --     Constitutional: Alert and oriented. Well appearing and in no acute distress. Cardiovascular: Normal rate, regular rhythm. Grossly normal heart sounds.  Good peripheral circulation. Respiratory: Normal respiratory effort.  No retractions. Lungs CTAB. Abdominal: Soft and nontender. No distention. No guarding no rebound Back:  There is no focal tenderness or step off there is no midline tenderness there are no lesions noted. there is no CVA tenderness Lower external male genitalia, patient's catheter is in place with no evidence of surrounding erythema or irritation. Vesicular exam is normal. Musculoskeletal: No lower extremity tenderness. No joint effusions, no DVT signs strong distal pulses no edema Skin:  Skin is warm, dry and intact. No rash noted. Psychiatric: Mood and affect are normal. Speech and behavior are normal.  ____________________________________________   LABS (all labs ordered are listed, but only abnormal results are  displayed)  Labs Reviewed  URINALYSIS COMPLETEWITH MICROSCOPIC ([facility] ONLY) - Abnormal; Notable for the following:    Color, Urine YELLOW (*)    APPearance CLOUDY (*)    Hgb urine dipstick 3+ (*)    Protein, ur 30 (*)    Leukocytes, UA 3+ (*)    Bacteria, UA FEW (*)    Squamous Epithelial / LPF 0-5 (*)    All other components within normal limits  URINE CULTURE  CBC WITH DIFFERENTIAL/PLATELET  PROTIME-INR  COMPREHENSIVE METABOLIC PANEL  ____________________________________________  EKG  I personally interpreted any EKGs ordered by me or triage  ____________________________________________  RADIOLOGY  I reviewed any imaging ordered by me or triage that were performed during my shift ____________________________________________   PROCEDURES  Procedure(s) performed: None  Critical Care performed: None  ____________________________________________   INITIAL IMPRESSION / ASSESSMENT AND PLAN / ED COURSE  Pertinent labs & imaging results that were available during my care of the patient were reviewed by me and considered in my medical decision making (see chart for details).  Patient with hematuria after Foley placement. Signed clear exactly how long after the Foley placement the hematuria began he states it was 3 days but is not exactly sure.. Patient always has hematuria and always appears to have a urinary tract infection on his UA because of the indwelling Foley he is colonized apparently. However, this seems to have some at least chronologic correlative to the replacement of the Foley itself. We will check CBC although he is not on any likely platelet lowering medications we'll check coags as a precaution we will check kidney function and reassess.  ----------------------------------------- 6:56 PM on 07/13/2015 -----------------------------------------  Patient possibly has a urinary tract infection is difficult to tell we'll send a culture and we from antibiotics.  We are changing out his Foley is a precaution. We do noticed that his sodium is 126. We will give him IV fluid and recheck to make sure that is not a lab error. I will need to admit him for essentially a asymptomatic hyponatremia. However, if we can resolve or begin to resolve his hyponatremia by IV fluid may need to admit him. ____________________________________________   FINAL CLINICAL IMPRESSION(S) / ED DIAGNOSES  Final diagnoses:  None      This chart was dictated using voice recognition software.  Despite best efforts to proofread,  errors can occur which can change meaning.    Schuyler Amor, MD 07/13/15 [place], MD 07/13/15 [place], MD 07/13/15 [phone]

## 2015-07-13 NOTE — ED Notes (Signed)
Reviewed d/c instructions, follow-up care, catheter care, signs of worsening infection/decreasing sodium levels, and reasons to return to MD/ED with pt. Pt verbalized understanding. Pt reported he would follow-up w/urology on Monday.  Placed urine leg drainage bag on pt.

## 2015-07-13 NOTE — ED Notes (Signed)
MD McShane ordered to wait on continuous NaCl infusion.

## 2015-07-18 ENCOUNTER — Telehealth: Payer: Self-pay | Admitting: Emergency Medicine

## 2015-07-19 LAB — URINE CULTURE

## 2015-07-20 NOTE — ED Provider Notes (Signed)
Patient with ESBL E. Coli in urine culture susceptible to Macrobid. Patient with chronic catheter. Called and spoke with patient who is doing well and feels well. Pharmacist called in [place] to [facility] in [place] to replace bactrim.  Lavonia Drafts, MD 07/20/15 [phone]

## 2015-07-20 NOTE — Progress Notes (Signed)
ED Urine culture came back positive for ESBL E.coli and Pseudomonas UTI.   Patient sent home on Bactrim and Levaquin.   Per MD Kinner, continue levaquin (covers pseudomonas) and change bactrim to Macrobid 100mg  PO BID x10 days (ESBL E.coli sensitive per sensitivities).   Called [facility] per patient request and adjusted antibiotics as above.  Estimated Creatinine Clearance: 39.4 mL/min (by C-G formula based on Cr of 1.97).  Results for orders placed or performed during the hospital encounter of 07/13/15  Urine culture     Status: None   Collection Time: 07/13/15  3:17 PM  Result Value Ref Range Status   Specimen Description URINE, RANDOM  Final   Special Requests NONE  Final   Culture   Final    >=100,000 COLONIES/mL ESCHERICHIA COLI >=100,000 COLONIES/mL PSEUDOMONAS SPECIES ESBL-EXTENDED SPECTRUM BETA LACTAMASE-THE ORGANISM IS RESISTANT TO PENICILLINS, CEPHALOSPORINS AND AZTREONAM ACCORDING TO CLSI M100-S15 VOL.[place]. ORGANISM 1 IS ESBL POSITIVE. Results Called to: [facility] AT [id number] ON 07/18/15. CTJ    Report Status 07/19/2015 FINAL  Final   Organism ID, Bacteria ESCHERICHIA COLI  Final   Organism ID, Bacteria PSEUDOMONAS SPECIES  Final      Susceptibility   Escherichia coli - MIC*    AMPICILLIN >=32 RESISTANT Resistant     CEFAZOLIN >=64 RESISTANT Resistant     CEFTRIAXONE >=64 RESISTANT Resistant     CIPROFLOXACIN >=4 RESISTANT Resistant     GENTAMICIN <=1 SENSITIVE Sensitive     IMIPENEM <=0.25 SENSITIVE Sensitive     NITROFURANTOIN <=16 SENSITIVE Sensitive     TRIMETH/SULFA >=320 RESISTANT Resistant     AMPICILLIN/SULBACTAM >=32 RESISTANT Resistant     PIP/TAZO <=4 SENSITIVE Sensitive     Extended ESBL POSITIVE Resistant     * >=100,000 COLONIES/mL ESCHERICHIA COLI   Pseudomonas species - MIC*    CEFTAZIDIME 16 INTERMEDIATE Intermediate     CIPROFLOXACIN 1 SENSITIVE Sensitive     GENTAMICIN <=1 SENSITIVE Sensitive     IMIPENEM 1 SENSITIVE Sensitive    CEFEPIME 8 SENSITIVE Sensitive     PIP/TAZO Value in next row Sensitive      SENSITIVE32    AMPICILLIN/SULBACTAM Value in next row Resistant      RESISTANT>=32    * >=100,000 COLONIES/mL PSEUDOMONAS SPECIES    Roe Coombs, PharmD Pharmacy Resident 07/20/2015

## 2015-07-30 IMAGING — NM NM RENOGRAM W/ LASIX
4 series · 24 of 24 positions shown · non-contrast
Comparison: none

REASON FOR EXAM: eval kidney function
COMMENTS:

[Series 1000: lasix · 4.80mm/px · 6 of 38 frames shown]
[frame 4/38]
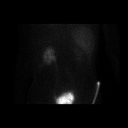
[frame 10/38]
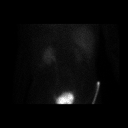
[frame 16/38]
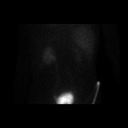
[frame 23/38]
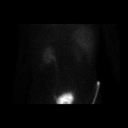
[frame 29/38]
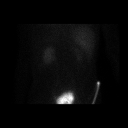
[frame 35/38]
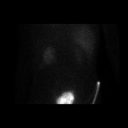

[Series 1000: lasix (results) · 4.80mm/px · 6 of 38 frames shown]
[frame 4/38]
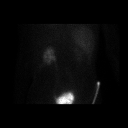
[frame 10/38]
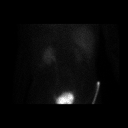
[frame 16/38]
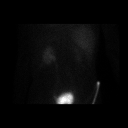
[frame 23/38]
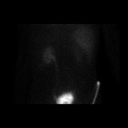
[frame 29/38]
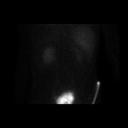
[frame 35/38]
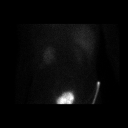

[Series 1000: renal · 7.79mm/px · 6 of 90 frames shown]
[frame 8/90]
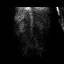
[frame 23/90]
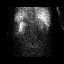
[frame 38/90]
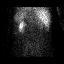
[frame 53/90]
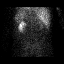
[frame 68/90]
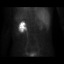
[frame 83/90]
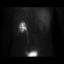

[Series 1000: renal (results) · 7.79mm/px · 6 of 90 frames shown]
[frame 8/90]
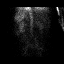
[frame 23/90]
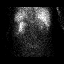
[frame 38/90]
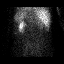
[frame 53/90]
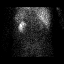
[frame 68/90]
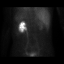
[frame 83/90]
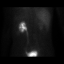

[24 of 24 positions shown; findings below may reference images not displayed]

PROCEDURE:     NM  - NM  RENAL LASIX  2 0F [DATE]  [DATE]

RESULT:     Lasix mammogram is performed utilizing 4.995 mCi of technetium
99m MAG3 and 60 mg Lasix intravenously. The patient has a creatinine of
2.66. Initial images demonstrate fairly prompt cortical uptake and left in
minimal to absent uptake in the right kidney. The split function is 14.2% on
the right and 85.8% on the left. There is still somewhat blunted uptake of
activity in the left kidney and somewhat sluggish excretion of activity
consistent with poor renal function. The left kidney excretes activity with
activity seen in the ureter and bladder. There does not appear to be
obstruction normal left. Minimal activity is seen in the right kidney. Lasix
was administered. There is continued decrease in the amount of activity of
the left kidney with minimal activity remaining of the right kidney.
IMPRESSION: 1. Functioning left kidney. Minimal activity of the right kidney without
significant function evident. No evidence of obstruction in the left kidney.
The right kidney did not excrete of activity to be evaluated. Differential
function on the initial pre-Lasix images is 14.2% normal right and 85.8% on
the left. The Lasix renogram images show differential function of 93.2% on
the left and 6.74% on the right. The
2. Patient has bilateral nephrostomy tubes clamped for the procedure.
Previously the in [REDACTED] demonstrated a left ureteral stent.

[REDACTED]

## 2015-08-09 IMAGING — XA IR NEPHROSTOMY
8 series · 8 of 8 positions shown · non-contrast
Comparison: none

REASON FOR EXAM: POSSIBLE TUBE REMOVAL LEFT    hydronephrosis
COMMENTS:

PROCEDURE:     VAS - NEPHROSTOGRAM  - January 14, 2013  [DATE]
RESULT:     Left t nephrostogram was performed with nonionic contrast and
reveals no evidence of obstructing lesion or stone. The nephrostomy tube was
removed over Amplatz extra-stiff wire. There are no complications.

[Series 2: single · 1 of 1 slices shown (1 of 8)]
[im 1/1]
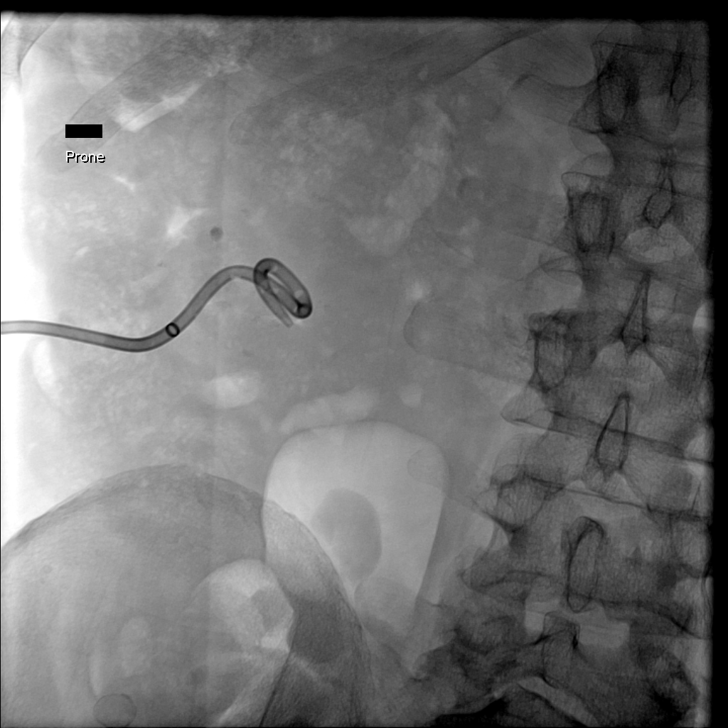

[Series 3: single · 1 of 1 slices shown (2 of 8)]
[im 1/1]
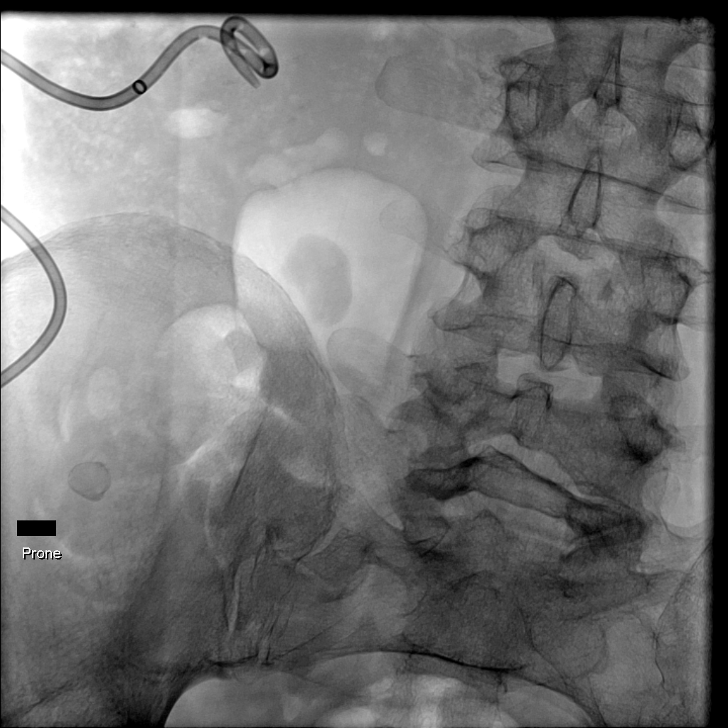

[Series 4: single · 1 of 1 slices shown (3 of 8)]
[im 1/1]
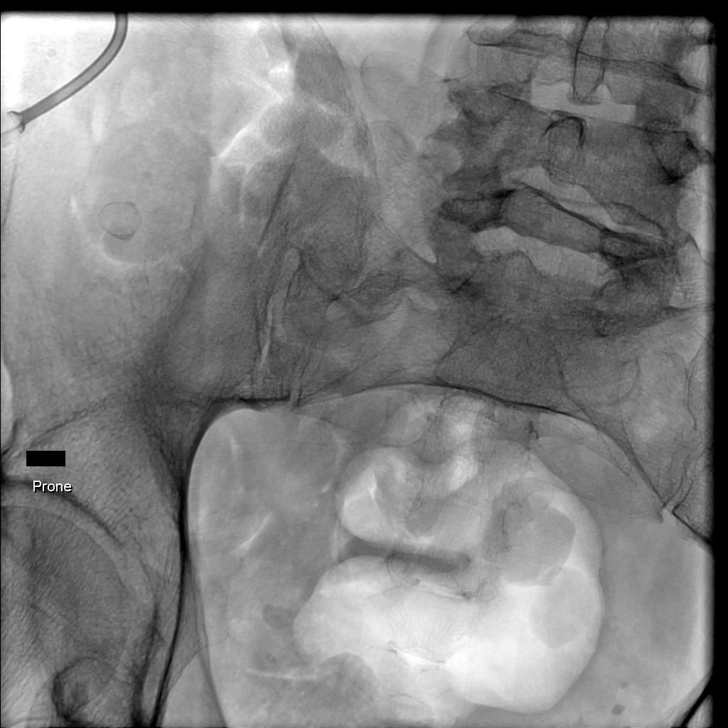

[Series 5: single · 1 of 1 slices shown (4 of 8)]
[im 1/1]
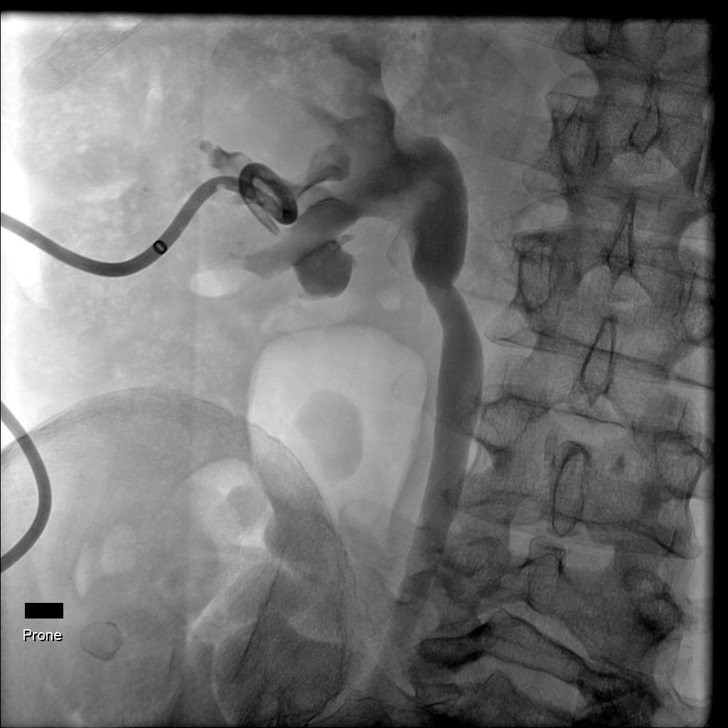

[Series 6: single · 1 of 1 slices shown (5 of 8)]
[im 1/1]
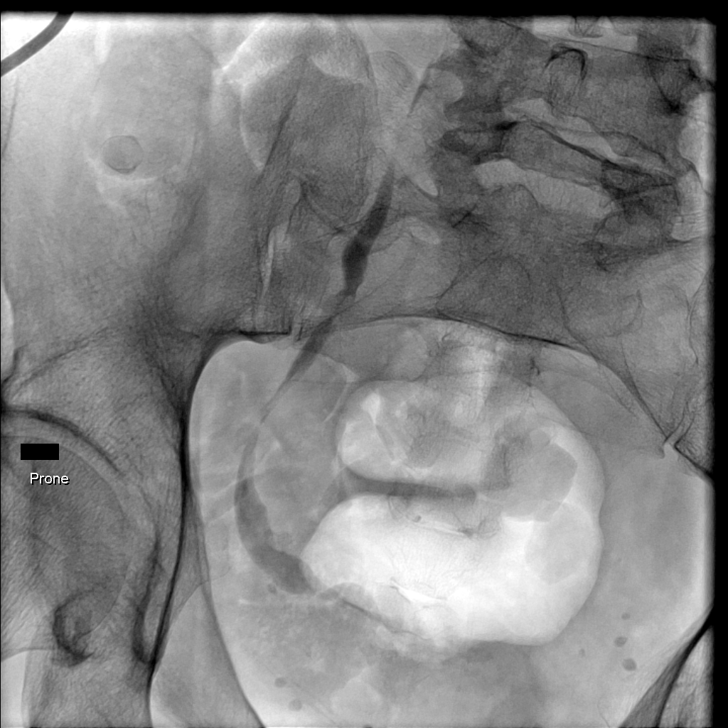

[Series 7: single · 1 of 1 slices shown (6 of 8)]
[im 1/1]
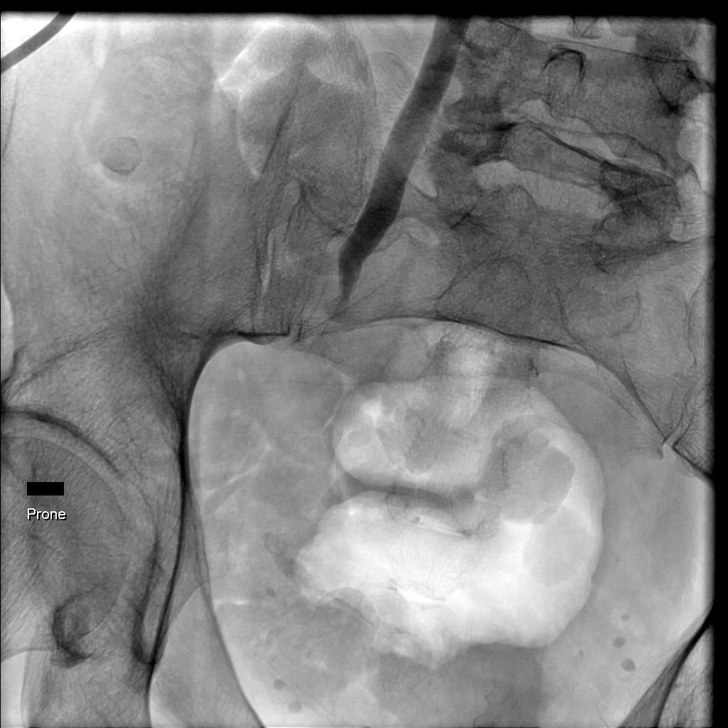

[Series 8: single · 1 of 1 slices shown (7 of 8)]
[im 1/1]
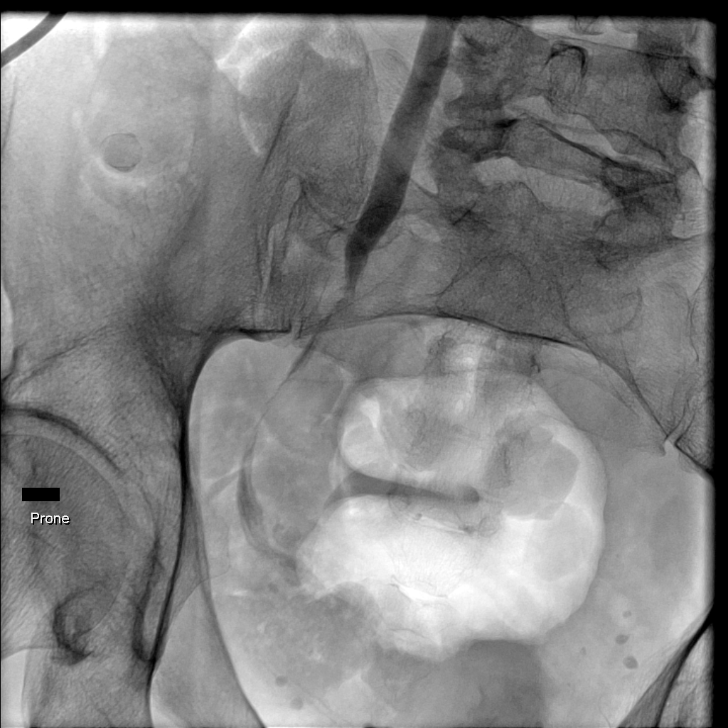

[Series 11: single · 1 of 1 slices shown (8 of 8)]
[im 1/1]
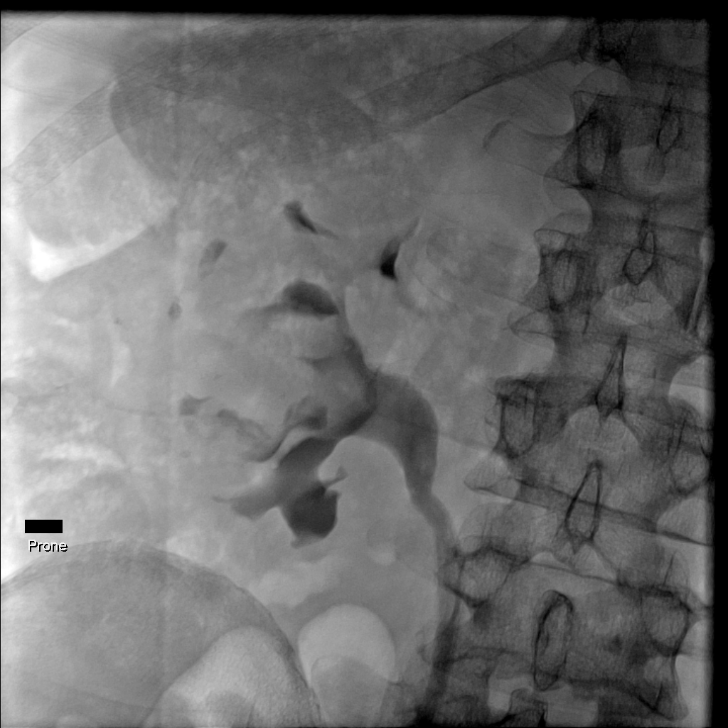

[8 of 8 positions shown; findings below may reference images not displayed]

IMPRESSION: Widely patent left ureter with patency to the bladder.
Contrast flows to the bladder easily. No obstructing ureteral lesion.

## 2015-08-27 DIAGNOSIS — F17200 Nicotine dependence, unspecified, uncomplicated: Secondary | ICD-10-CM | POA: Insufficient documentation

## 2015-10-17 ENCOUNTER — Emergency Department: Payer: Medicare Other

## 2015-10-17 ENCOUNTER — Encounter: Payer: Self-pay | Admitting: Emergency Medicine

## 2015-10-17 ENCOUNTER — Emergency Department
Admission: EM | Admit: 2015-10-17 | Discharge: 2015-10-17 | Disposition: A | Discharge status: Home / Self Care | Payer: Medicare Other | Attending: Emergency Medicine | Admitting: Emergency Medicine

## 2015-10-17 DIAGNOSIS — W172XXA Fall into hole, initial encounter: Secondary | ICD-10-CM | POA: Insufficient documentation

## 2015-10-17 DIAGNOSIS — F329 Major depressive disorder, single episode, unspecified: Secondary | ICD-10-CM | POA: Diagnosis not present

## 2015-10-17 DIAGNOSIS — R079 Chest pain, unspecified: Secondary | ICD-10-CM | POA: Insufficient documentation

## 2015-10-17 DIAGNOSIS — I252 Old myocardial infarction: Secondary | ICD-10-CM | POA: Diagnosis not present

## 2015-10-17 DIAGNOSIS — Y929 Unspecified place or not applicable: Secondary | ICD-10-CM | POA: Insufficient documentation

## 2015-10-17 DIAGNOSIS — Z8673 Personal history of transient ischemic attack (TIA), and cerebral infarction without residual deficits: Secondary | ICD-10-CM | POA: Insufficient documentation

## 2015-10-17 DIAGNOSIS — S299XXA Unspecified injury of thorax, initial encounter: Secondary | ICD-10-CM | POA: Diagnosis present

## 2015-10-17 DIAGNOSIS — R0789 Other chest pain: Secondary | ICD-10-CM

## 2015-10-17 DIAGNOSIS — F17210 Nicotine dependence, cigarettes, uncomplicated: Secondary | ICD-10-CM | POA: Insufficient documentation

## 2015-10-17 DIAGNOSIS — Z79899 Other long term (current) drug therapy: Secondary | ICD-10-CM | POA: Insufficient documentation

## 2015-10-17 DIAGNOSIS — F99 Mental disorder, not otherwise specified: Secondary | ICD-10-CM | POA: Insufficient documentation

## 2015-10-17 DIAGNOSIS — Y999 Unspecified external cause status: Secondary | ICD-10-CM | POA: Insufficient documentation

## 2015-10-17 DIAGNOSIS — Y939 Activity, unspecified: Secondary | ICD-10-CM | POA: Insufficient documentation

## 2015-10-17 DIAGNOSIS — E039 Hypothyroidism, unspecified: Secondary | ICD-10-CM | POA: Diagnosis not present

## 2015-10-17 DIAGNOSIS — G40909 Epilepsy, unspecified, not intractable, without status epilepticus: Secondary | ICD-10-CM | POA: Insufficient documentation

## 2015-10-17 DIAGNOSIS — W19XXXA Unspecified fall, initial encounter: Secondary | ICD-10-CM

## 2015-10-17 DIAGNOSIS — J45909 Unspecified asthma, uncomplicated: Secondary | ICD-10-CM | POA: Diagnosis not present

## 2015-10-17 MED ORDER — OXYCODONE-ACETAMINOPHEN 5-325 MG PO TABS
ORAL_TABLET | ORAL | Status: AC
  Administered 2015-10-17: 1 via ORAL
  Filled 2015-10-17: qty 1

## 2015-10-17 MED ORDER — OXYCODONE-ACETAMINOPHEN 5-325 MG PO TABS: 1.0000 | ORAL_TABLET | Freq: Four times a day (QID) | ORAL | Status: DC | PRN

## 2015-10-17 MED ORDER — OXYCODONE HCL 5 MG PO TABS: 5.0000 mg | ORAL_TABLET | Freq: Four times a day (QID) | ORAL | Status: DC | PRN

## 2015-10-17 MED ORDER — OXYCODONE-ACETAMINOPHEN 5-325 MG PO TABS
1.0000 | ORAL_TABLET | Freq: Once | ORAL | Status: AC
  Administered 2015-10-17: 1 via ORAL

## 2015-10-17 NOTE — ED Provider Notes (Signed)
[facility] Emergency Department Provider Note  ____________________________________________  Time seen: Approximately 4:12 PM  I have reviewed the triage vital signs and the nursing notes.   HISTORY  Chief Complaint Rib Injury    HPI Alan Small is a 60 y.o. male uses a cane to walk presenting with left lateral chest wall pain after a fall 2 weeks ago. The patient reports "I was watching wires going" and he fell into an open manhole striking his left lateral chest wall. Since then, he has tried ibuprofen without improvement. He reports the pain is worse with any positional changes or if he takes deep breaths. He is not feel short of breath, nor does he have any central chest pain. No lightheadedness or syncope.   Past Medical History  Diagnosis Date  . Mental disorder   . Seizures ([place])   . Myocardial infarction ([place])   . Stroke ([place])   . Asthma   . Paranoid schizophrenia ([place])   . Gastric ulcer   . External hemorrhoids   . Hypothyroidism     hypo  . Insomnia   . Depression   . Renal disorder     renal failure  . Hydronephrosis     Patient Active Problem List   Diagnosis Date Noted  . Malnutrition of moderate degree 05/09/2015  . Sepsis ([place]) 05/06/2015  . UTI (lower urinary tract infection) 01/17/2015  . Hypoglycemia 01/17/2015  . Metabolic acidosis 0000000  . Acute renal failure ([place]) 11/03/2012  . Hyperkalemia 11/03/2012  . Pyelonephritis 11/03/2012  . Schizophrenia ([place]) 11/03/2012  . Hypothyroidism 11/03/2012  . Severe sepsis ([place]) 11/03/2012  . Protein-calorie malnutrition, severe ([place]) 11/03/2012  . Encephalopathy acute 11/03/2012    Past Surgical History  Procedure Laterality Date  . Appendectomy    . Colonoscopy with propofol N/A 12/07/2014    Procedure: COLONOSCOPY WITH PROPOFOL;  Surgeon: Manya Silvas, MD;  Location: [facility] ENDOSCOPY;  Service: Endoscopy;  Laterality: N/A;  . Esophagogastroduodenoscopy N/A 12/07/2014     Procedure: ESOPHAGOGASTRODUODENOSCOPY (EGD);  Surgeon: Manya Silvas, MD;  Location[facility] ENDOSCOPY;  Service: Endoscopy;  Laterality: N/A;  . Nephrectomy      Current Outpatient Rx  Name  Route  Sig  Dispense  Refill  . albuterol-ipratropium (COMBIVENT) 18-103 MCG/ACT inhaler   Inhalation   Inhale 2 puffs into the lungs every 6 (six) hours as needed for wheezing or shortness of breath.          . feeding supplement, ENSURE ENLIVE, (ENSURE ENLIVE) LIQD   Oral   Take 237 mLs by mouth 2 (two) times daily with a meal. Patient not taking: Reported on 07/13/2015   237 mL   12   . FLUoxetine (PROZAC) 40 MG capsule   Oral   Take 40 mg by mouth daily.         . Fluticasone-Salmeterol (ADVAIR) 250-50 MCG/DOSE AEPB   Inhalation   Inhale 1 puff into the lungs 2 (two) times daily.         . hydrocortisone (ANUSOL-HC) 2.5 % rectal cream   Rectal   Place rectally 2 (two) times daily. Patient not taking: Reported on 07/13/2015   30 g   0   . levofloxacin (LEVAQUIN) 750 MG tablet   Oral   Take 1 tablet (750 mg total) by mouth daily.   7 tablet   0   . levothyroxine (SYNTHROID, LEVOTHROID) 50 MCG tablet   Oral   Take 50 mcg by mouth daily before  breakfast.         . opium-belladonna (B&O SUPPRETTES) 16.2-60 MG suppository   Rectal   Place 1 suppository rectally every 6 (six) hours as needed for bladder spasms.   12 suppository   0   . oxyCODONE (ROXICODONE) 5 MG immediate release tablet   Oral   Take 1-2 tablets (5-10 mg total) by mouth every 6 (six) hours as needed for severe pain.   15 tablet   0   . risperiDONE (RISPERDAL) 0.5 MG tablet   Oral   Take 0.5 mg by mouth at bedtime. Pt takes with a 3mg  tablet.         . risperiDONE (RISPERDAL) 3 MG tablet   Oral   Take 3 mg by mouth at bedtime. Pt takes with a 0.5mg  tablet.         . sodium bicarbonate 650 MG tablet   Oral   Take 1,300 mg by mouth 2 (two) times daily.         [name]  sulfamethoxazole-trimethoprim (BACTRIM,SEPTRA) 400-80 MG tablet   Oral   Take 1 tablet by mouth daily.         . tamsulosin (FLOMAX) 0.4 MG CAPS capsule   Oral   Take 0.4 mg by mouth at bedtime.            Allergies Mellaril; Buprenorphine; Morphine and related; Navane; and Thorazine  Family History  Problem Relation Age of Onset  . Stroke Father   . Pneumonia Father   . Brain cancer Mother     Social History Social History  Substance Use Topics  . Smoking status: Current Every Day Smoker -- 1.00 packs/day for 55 years    Types: Cigarettes  . Smokeless tobacco: Former Systems developer    Types: Chew  . Alcohol Use: No    Review of Systems Constitutional: No fever/chills. No lightheadedness or syncope. Eyes: No visual changes. ENT: No sore throat. No congestion or rhinorrhea. Cardiovascular: Denies chest pain. Denies palpitations. Respiratory: Denies shortness of breath.  No cough. Positive left lateral chest wall pain. Gastrointestinal: No abdominal pain.  No nausea, no vomiting.  No diarrhea.  No constipation. Genitourinary: Negative for dysuria. Musculoskeletal: Negative for neck or back pain. No swelling of the legs or tenderness to in the calves. Skin: Negative for rash. Neurological: Negative for headaches. No focal numbness, tingling or weakness.   10-point ROS otherwise negative.  ____________________________________________   PHYSICAL EXAM:  VITAL SIGNS: ED Triage Vitals  Enc Vitals Group     BP 10/17/15 1440 107/74 mmHg     Pulse Rate 10/17/15 1440 93     Resp 10/17/15 1440 18     Temp 10/17/15 1440 98.3 F (36.8 C)     Temp Source 10/17/15 1440 Oral     SpO2 10/17/15 1440 95 %     Weight 10/17/15 1440 156 lb (70.761 kg)     Height 10/17/15 1440 6' (1.829 m)     Head Cir --      Peak Flow --      Pain Score 10/17/15 1440 10     Pain Loc --      Pain Edu? --      Excl. in [place]? --     Constitutional: Alert and oriented. Chronically ill appearing and  in no acute distress. Answers questions appropriately. Eyes: Conjunctivae are normal.  EOMI. No scleral icterus. Head: Atraumatic. Nose: No congestion/rhinnorhea. Mouth/Throat: Mucous membranes are moist.  Neck: No stridor.  Supple.  No  midline C-spine tenderness to palpation, step-offs or deformities. Full range of motion without pain. Cardiovascular: Normal rate, regular rhythm. No murmurs, rubs or gallops.  Respiratory: Normal respiratory effort.  No accessory muscle use or retractions. Lungs CTAB.  Mild expiratory wheezing which is consistent with the patient's tobacco history. No rales or rhonchi. Gastrointestinal: Soft, nontender and nondistended.  No guarding or rebound.  No peritoneal signs. Musculoskeletal: No LE edema. No ttp in the calves or palpable cords.  Negative Homan's sign. Left lateral and inferior chest wall is tender to palpation without palpable crepitus, nor any obvious ecchymosis, swelling or skin changes. Pain can be reproduced with palpation or with positional changes. Neurologic:  A&Ox3.  Speech is clear.  Face and smile are symmetric.  EOMI.  Moves all extremities well. Skin:  Skin is warm, dry and intact. No rash noted. Psychiatric: Mood and affect are normal. Speech and behavior are normal.  Normal judgement.  ____________________________________________   LABS (all labs ordered are listed, but only abnormal results are displayed)  Labs Reviewed - No data to display ____________________________________________  EKG  ED ECG REPORT I, Eula Listen, the attending physician, personally viewed and interpreted this ECG.   Date: 10/17/2015  EKG Time: 1624  Rate: 78  Rhythm: normal sinus rhythm  Axis: Normal  Intervals:none  ST&T Change: No ST elevation.  ____________________________________________  RADIOLOGY  Dg Chest 2 View  10/17/2015  CLINICAL DATA:  Recent fall and man hole with left-sided chest pain, initial encounter EXAM: CHEST  2 VIEW  COMPARISON:  05/08/2015 FINDINGS: COPD without acute abnormality. IMPRESSION: No active cardiopulmonary disease. Electronically Signed   By: Inez Catalina M.D.   On: 10/17/2015 16:12    ____________________________________________   PROCEDURES  Procedure(s) performed: None  Critical Care performed: No ____________________________________________   INITIAL IMPRESSION / ASSESSMENT AND PLAN / ED COURSE  Pertinent labs & imaging results that were available during my care of the patient were reviewed by me and considered in my medical decision making (see chart for details).  60 y.o. male who uses a cane S/P mechanical fall 2 weeks ago presenting with persistent left lateral chest wall pain. On my exam, the patient's pain is reproducible with palpation and ecchymosis of the etiology is either osteal skeletal strain, contusion, or possibly a nondisplaced rib fractures. I do hear breath sounds bilaterally, so pneumothorax is much less likely but will get a chest x-ray to evaluate for this. The patient has only tried ibuprofen, so I talked to him about heat therapy and cryotherapy, and will initiate narcotics as needed. There is no clinical evidence that would be suggestive of PE or an acute cardiac cause for the patient's pain.  ----------------------------------------- 4:16 PM on 10/17/2015 -----------------------------------------  Patient's chest x-ray does not show any displaced rib fractures, nor any cardiopulmonary abnormalities. We'll plan to discharge the patient home with close follow-up.  ____________________________________________  FINAL CLINICAL IMPRESSION(S) / ED DIAGNOSES  Final diagnoses:  Left-sided chest wall pain  Fall, initial encounter      NEW MEDICATIONS STARTED DURING THIS VISIT:  New Prescriptions   OXYCODONE (ROXICODONE) 5 MG IMMEDIATE RELEASE TABLET    Take 1-2 tablets (5-10 mg total) by mouth every 6 (six) hours as needed for severe pain.      Eula Listen, MD 10/17/15 1627

## 2015-10-17 NOTE — ED Notes (Signed)
Pt presents with left side rib pain worse when takes deep breath after falling down into a  Man hole two weeks ago.[name]

## 2015-10-17 NOTE — Discharge Instructions (Signed)
Please apply heat or ice for 15 minutes every 2 hours to decrease pain. For mild to moderate pain, you may take Tylenol. For severe pain he may take oxycodone. He may not drive within 8 hours of taking oxycodone.  With your single kidney history, please discontinue any NSAID medications including ibuprofen, Motrin, Aleve, or Advil. These medications can permanently damage your remaining kidney.   Chest Wall Pain Chest wall pain is pain in or around the bones and muscles of your chest. Sometimes, an injury causes this pain. Sometimes, the cause may not be known. This pain may take several weeks or longer to get better. HOME CARE INSTRUCTIONS  Pay attention to any changes in your symptoms. Take these actions to help with your pain:   Rest as told by your health care provider.   Avoid activities that cause pain. These include any activities that use your chest muscles or your abdominal and side muscles to lift heavy items.   If directed, apply ice to the painful area:  Put ice in a plastic bag.  Place a towel between your skin and the bag.  Leave the ice on for 20 minutes, 2-3 times per day.  Take over-the-counter and prescription medicines only as told by your health care provider.  Do not use tobacco products, including cigarettes, chewing tobacco, and e-cigarettes. If you need help quitting, ask your health care provider.  Keep all follow-up visits as told by your health care provider. This is important. SEEK MEDICAL CARE IF:  You have a fever.  Your chest pain becomes worse.  You have new symptoms. SEEK IMMEDIATE MEDICAL CARE IF:  You have nausea or vomiting.  You feel sweaty or light-headed.  You have a cough with phlegm (sputum) or you cough up blood.  You develop shortness of breath.   This information is not intended to replace advice given to you by your health care provider. Make sure you discuss any questions you have with your health care provider.   Document  Released: 05/19/2005 Document Revised: 02/07/2015 Document Reviewed: 08/14/2014 Elsevier Interactive Patient Education [employer].

## 2015-10-17 NOTE — ED Notes (Signed)
Pt had fall xcoupel weeks ago and c/o Left rib pain. Pt states its painful to breathe.

## 2015-10-27 DIAGNOSIS — R339 Retention of urine, unspecified: Secondary | ICD-10-CM | POA: Insufficient documentation

## 2015-12-17 DIAGNOSIS — N35919 Unspecified urethral stricture, male, unspecified site: Secondary | ICD-10-CM | POA: Insufficient documentation

## 2015-12-17 DIAGNOSIS — Z9359 Other cystostomy status: Secondary | ICD-10-CM | POA: Insufficient documentation

## 2016-02-02 ENCOUNTER — Emergency Department
Admission: EM | Admit: 2016-02-02 | Discharge: 2016-02-03 | Disposition: A | Discharge status: Other Acute Inpatient Hospital | Payer: Medicare Other | Attending: Emergency Medicine | Admitting: Emergency Medicine

## 2016-02-02 ENCOUNTER — Encounter: Payer: Self-pay | Admitting: Emergency Medicine

## 2016-02-02 DIAGNOSIS — I252 Old myocardial infarction: Secondary | ICD-10-CM | POA: Diagnosis not present

## 2016-02-02 DIAGNOSIS — Z9359 Other cystostomy status: Secondary | ICD-10-CM

## 2016-02-02 DIAGNOSIS — E039 Hypothyroidism, unspecified: Secondary | ICD-10-CM | POA: Insufficient documentation

## 2016-02-02 DIAGNOSIS — Z79899 Other long term (current) drug therapy: Secondary | ICD-10-CM | POA: Insufficient documentation

## 2016-02-02 DIAGNOSIS — N179 Acute kidney failure, unspecified: Secondary | ICD-10-CM | POA: Insufficient documentation

## 2016-02-02 DIAGNOSIS — J45909 Unspecified asthma, uncomplicated: Secondary | ICD-10-CM | POA: Insufficient documentation

## 2016-02-02 DIAGNOSIS — T83098A Other mechanical complication of other indwelling urethral catheter, initial encounter: Secondary | ICD-10-CM | POA: Insufficient documentation

## 2016-02-02 DIAGNOSIS — F17210 Nicotine dependence, cigarettes, uncomplicated: Secondary | ICD-10-CM | POA: Insufficient documentation

## 2016-02-02 DIAGNOSIS — N359 Urethral stricture, unspecified: Secondary | ICD-10-CM | POA: Diagnosis not present

## 2016-02-02 DIAGNOSIS — Y69 Unspecified misadventure during surgical and medical care: Secondary | ICD-10-CM | POA: Insufficient documentation

## 2016-02-02 DIAGNOSIS — N390 Urinary tract infection, site not specified: Secondary | ICD-10-CM | POA: Insufficient documentation

## 2016-02-02 DIAGNOSIS — R1030 Lower abdominal pain, unspecified: Secondary | ICD-10-CM | POA: Diagnosis present

## 2016-02-02 LAB — BASIC METABOLIC PANEL
Anion gap: 5 (ref 5–15)
BUN: 32 mg/dL — AB (ref 6–20)
CALCIUM: 9 mg/dL (ref 8.9–10.3)
CO2: 23 mmol/L (ref 22–32)
CREATININE: 2.55 mg/dL — AB (ref 0.61–1.24)
Chloride: 110 mmol/L (ref 101–111)
GFR calc Af Amer: 30 mL/min — ABNORMAL LOW (ref >60)
GFR, EST NON AFRICAN AMERICAN: 26 mL/min — AB (ref >60)
Glucose, Bld: 131 mg/dL — ABNORMAL HIGH (ref 65–99)
POTASSIUM: 4.1 mmol/L (ref 3.5–5.1)
SODIUM: 138 mmol/L (ref 135–145)

## 2016-02-02 LAB — CBC
HEMATOCRIT: 39.9 % — AB (ref 40.0–52.0)
Hemoglobin: 13.9 g/dL (ref 13.0–18.0)
MCH: 31.3 pg (ref 26.0–34.0)
MCHC: 34.9 g/dL (ref 32.0–36.0)
MCV: 89.7 fL (ref 80.0–100.0)
PLATELETS: 192 10*3/uL (ref 150–440)
RBC: 4.45 MIL/uL (ref 4.40–5.90)
RDW: 16.6 % — AB (ref 11.5–14.5)
WBC: 27.2 10*3/uL — AB (ref 3.8–10.6)

## 2016-02-02 LAB — URINALYSIS COMPLETE WITH MICROSCOPIC (ARMC ONLY)
BILIRUBIN URINE: NEGATIVE
GLUCOSE, UA: NEGATIVE mg/dL
KETONES UR: NEGATIVE mg/dL
NITRITE: POSITIVE — AB
PH: 7 (ref 5.0–8.0)
Protein, ur: 100 mg/dL — AB
Specific Gravity, Urine: 1.016 (ref 1.005–1.030)

## 2016-02-02 LAB — HEPATIC FUNCTION PANEL
ALBUMIN: 3.2 g/dL — AB (ref 3.5–5.0)
ALT: 12 U/L — ABNORMAL LOW (ref 17–63)
AST: 18 U/L (ref 15–41)
Alkaline Phosphatase: 90 U/L (ref 38–126)
Bilirubin, Direct: <0.1 mg/dL — ABNORMAL LOW (ref 0.1–0.5)
TOTAL PROTEIN: 6.9 g/dL (ref 6.5–8.1)
Total Bilirubin: 0.5 mg/dL (ref 0.3–1.2)

## 2016-02-02 LAB — LIPASE, BLOOD: LIPASE: 16 U/L (ref 11–51)

## 2016-02-02 LAB — LACTIC ACID, PLASMA: LACTIC ACID, VENOUS: 0.9 mmol/L (ref 0.5–1.9)

## 2016-02-02 MED ORDER — SODIUM CHLORIDE 0.9 % IV SOLN
Freq: Once | INTRAVENOUS | Status: AC
  Administered 2016-02-02: 23:00:00 via INTRAVENOUS

## 2016-02-02 MED ORDER — SODIUM CHLORIDE 0.9 % IV BOLUS (SEPSIS)
1000.0000 mL | INTRAVENOUS | Status: AC
  Administered 2016-02-02: 1000 mL via INTRAVENOUS

## 2016-02-02 MED ORDER — IBUPROFEN 800 MG PO TABS
ORAL_TABLET | ORAL | Status: AC
  Filled 2016-02-02: qty 1

## 2016-02-02 MED ORDER — DEXTROSE 5 % IV SOLN
1.0000 g | INTRAVENOUS | Status: AC
  Administered 2016-02-02: 1 g via INTRAVENOUS
  Filled 2016-02-02: qty 10

## 2016-02-02 NOTE — ED Provider Notes (Signed)
[facility] Emergency Department Provider Note  ____________________________________________   First MD Initiated Contact with Patient 02/02/16 2150     (approximate)  I have reviewed the triage vital signs and the nursing notes.   HISTORY  Chief Complaint Abdominal Pain    HPI BRETTON Small is a 60 y.o. 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 He is scheduled for a urethroplasty and six days at [facility] to try  And fix the issues so that they can reverse the suprapubic catheter. He had a preoperative visit that was reassuring with [facility] about seven days ago.  He presents tonight for evaluation ofgradual onset worsening a lower abdominal pain for the last week. He describes the pain as sharp and aching. He states that he noticed he was having blood in the urine starting about two days ago and that the pain is gotten worse. He denies nausea, vomiting, fever/chills, chest pain, shortness of breath. He describes the pain as severe at its worst and mild at its best and nothing in particular makes it better nor worse.  He lives in a family care home locally.   Past Medical History:  Diagnosis Date  . Asthma   . Depression   . External hemorrhoids   . Gastric ulcer   . Hydronephrosis   . Hypothyroidism    hypo  . Insomnia   . Mental disorder   . Myocardial infarction ([place])   . Paranoid schizophrenia ([place])   . Renal disorder    renal failure  . Seizures ([place])   . Stroke [facility])     Patient Active Problem List   Diagnosis Date Noted  . Malnutrition of moderate degree 05/09/2015  . Sepsis ([place]) 05/06/2015  . UTI (lower urinary tract infection) 01/17/2015  . Hypoglycemia 01/17/2015  . Metabolic acidosis 0000000  . Acute renal failure ([place]) 11/03/2012  . Hyperkalemia 11/03/2012  . Pyelonephritis 11/03/2012  . Schizophrenia ([place]) 11/03/2012  .  Hypothyroidism 11/03/2012  . Severe sepsis ([place]) 11/03/2012  . Protein-calorie malnutrition, severe ([place]) 11/03/2012  . Encephalopathy acute 11/03/2012    Past Surgical History:  Procedure Laterality Date  . APPENDECTOMY    . COLONOSCOPY WITH PROPOFOL N/A 12/07/2014   Procedure: COLONOSCOPY WITH PROPOFOL;  Surgeon: Manya Silvas, MD;  Location: [facility] ENDOSCOPY;  Service: Endoscopy;  Laterality: N/A;  . ESOPHAGOGASTRODUODENOSCOPY N/A 12/07/2014   Procedure: ESOPHAGOGASTRODUODENOSCOPY (EGD);  Surgeon: Manya Silvas, MD;  Location: [facility] ENDOSCOPY;  Service: Endoscopy;  Laterality: N/A;  . NEPHRECTOMY      Prior to Admission medications   Medication Sig Start Date End Date Taking? Authorizing Provider  albuterol-ipratropium (COMBIVENT) 18-103 MCG/ACT inhaler Inhale 2 puffs into the lungs every 6 (six) hours as needed for wheezing or shortness of breath.     Historical Provider, MD  feeding supplement, ENSURE ENLIVE, (ENSURE ENLIVE) LIQD Take 237 mLs by mouth 2 (two) times daily with a meal. Patient not taking: Reported on 07/13/2015 05/11/15   Gladstone Lighter, MD  FLUoxetine (PROZAC) 40 MG capsule Take 40 mg by mouth daily.    Historical Provider, MD  Fluticasone-Salmeterol (ADVAIR) 250-50 MCG/DOSE AEPB Inhale 1 puff into the lungs 2 (two) times daily.    Historical Provider, MD  hydrocortisone (ANUSOL-HC) 2.5 % rectal cream Place rectally 2 (two) times daily. Patient not taking: Reported on 07/13/2015 05/11/15   Gladstone Lighter, MD  levofloxacin (LEVAQUIN) 750 MG  tablet Take 1 tablet (750 mg total) by mouth daily. 07/13/15   Schuyler Amor, MD  levothyroxine (SYNTHROID, LEVOTHROID) 50 MCG tablet Take 50 mcg by mouth daily before breakfast.    Historical Provider, MD  opium-belladonna (B&O SUPPRETTES) 16.2-60 MG suppository Place 1 suppository rectally every 6 (six) hours as needed for bladder spasms. 05/11/15   Gladstone Lighter, MD  oxyCODONE (ROXICODONE) 5 MG immediate release tablet Take  1-2 tablets (5-10 mg total) by mouth every 6 (six) hours as needed for severe pain. 10/17/15 10/16/16  Anne-Caroline Mariea Clonts, MD  risperiDONE (RISPERDAL) 0.5 MG tablet Take 0.5 mg by mouth at bedtime. Pt takes with a 3mg  tablet.    Historical Provider, MD  risperiDONE (RISPERDAL) 3 MG tablet Take 3 mg by mouth at bedtime. Pt takes with a 0.5mg  tablet.    Historical Provider, MD  sodium bicarbonate 650 MG tablet Take 1,300 mg by mouth 2 (two) times daily.    Historical Provider, MD  sulfamethoxazole-trimethoprim (BACTRIM,SEPTRA) 400-80 MG tablet Take 1 tablet by mouth daily.    Historical Provider, MD  tamsulosin (FLOMAX) 0.4 MG CAPS capsule Take 0.4 mg by mouth at bedtime.     Historical Provider, MD    Allergies Mellaril [thioridazine]; Buprenorphine; Morphine and related; Navane [thiothixene]; and Thorazine [chlorpromazine]  Family History  Problem Relation Age of Onset  . Stroke Father   . Pneumonia Father   . Brain cancer Mother     Social History Social History  Substance Use Topics  . Smoking status: Current Every Day Smoker    Packs/day: 1.00    Years: 55.00    Types: Cigarettes  . Smokeless tobacco: Former Systems developer    Types: Chew  . Alcohol use No    Review of Systems Constitutional: No fever/chills Eyes: No visual changes. ENT: No sore throat. Cardiovascular: Denies chest pain. Respiratory: Denies shortness of breath. Gastrointestinal: +lower abdominal pain.  No nausea, no vomiting.  No diarrhea.  No constipation. Genitourinary: Negative for dysuria. +hematuria.  Suprapubic catheter in place. Musculoskeletal: Negative for back pain. Skin: Negative for rash. Neurological: Negative for headaches, focal weakness or numbness.  10-point ROS otherwise negative.  ____________________________________________   PHYSICAL EXAM:  VITAL SIGNS: ED Triage Vitals  Enc Vitals Group     BP 02/02/16 1727 (!) 113/56     Pulse Rate 02/02/16 1727 65     Resp 02/02/16 1727 20      Temp 02/02/16 1727 99.1 F (37.3 C)     Temp Source 02/02/16 1727 Oral     SpO2 02/02/16 1721 97 %     Weight 02/02/16 1727 159 lb (72.1 kg)     Height 02/02/16 1727 6' (1.829 m)     Head Circumference --      Peak Flow --      Pain Score 02/02/16 1727 10     Pain Loc --      Pain Edu? --      Excl. in [place]? --     Constitutional: Alert and oriented. Disheveled.  No acute distress. Eyes: Conjunctivae are normal. PERRL. EOMI. Head: Atraumatic. Nose: No congestion/rhinnorhea. Mouth/Throat: Mucous membranes are moist.  Oropharynx non-erythematous. Neck: No stridor.  No meningeal signs.   Cardiovascular: Normal rate, regular rhythm. Good peripheral circulation. Grossly normal heart sounds. Respiratory: Normal respiratory effort.  No retractions. Lungs CTAB. Gastrointestinal: Soft and nontender. No distention.  Genitourinary: Suprapubic urinary catheter in place.  Site is well-appearing, but a foul odor is noticeable as soon as one  walks into the room.  The urine within the catheter to is thick, filled with sediment, and at places appears bluish green which to me suggests pseudomonas.  It appears to be grossly infected. Musculoskeletal: No lower extremity tenderness nor edema. No gross deformities of extremities. Neurologic:  Normal speech and language. No gross focal neurologic deficits are appreciated.  Skin:  Skin is warm, dry and intact. No rash noted. Psychiatric: Mood and affect are normal. Speech and behavior are normal.  ____________________________________________   LABS (all labs ordered are listed, but only abnormal results are displayed)  Labs Reviewed  BASIC METABOLIC PANEL - Abnormal; Notable for the following:       Result Value   Glucose, Bld 131 (*)    BUN 32 (*)    Creatinine, Ser 2.55 (*)    GFR calc non Af Amer 26 (*)    GFR calc Af Amer 30 (*)    All other components within normal limits  CBC - Abnormal; Notable for the following:    WBC 27.2 (*)    HCT 39.9  (*)    RDW 16.6 (*)    All other components within normal limits  HEPATIC FUNCTION PANEL - Abnormal; Notable for the following:    Albumin 3.2 (*)    ALT 12 (*)    Bilirubin, Direct <0.1 (*)    All other components within normal limits  URINALYSIS COMPLETEWITH MICROSCOPIC ([facility] ONLY) - Abnormal; Notable for the following:    Color, Urine YELLOW (*)    APPearance CLOUDY (*)    Hgb urine dipstick 3+ (*)    Protein, ur 100 (*)    Nitrite POSITIVE (*)    Leukocytes, UA 3+ (*)    Bacteria, UA MANY (*)    Squamous Epithelial / LPF 0-5 (*)    All other components within normal limits  URINE CULTURE  CULTURE, BLOOD (ROUTINE X 2)  CULTURE, BLOOD (ROUTINE X 2)  LIPASE, BLOOD  LACTIC ACID, PLASMA   ____________________________________________  EKG  EKG not ordered by ED physician ____________________________________________  RADIOLOGY   No results found.  ____________________________________________   PROCEDURES  Procedure(s) performed:   Procedures   Critical Care performed: No ____________________________________________   INITIAL IMPRESSION / ASSESSMENT AND PLAN / ED COURSE  Pertinent labs & imaging results that were available during my care of the patient were reviewed by me and considered in my medical decision making (see chart for details).  The patient had labs performed in triage and were notable for a leukocytosis of greater than 27.  He also has a creatinine of 2.5 which is at least 0.5 above his new baseline  After the acute kidney injury/acute renal failure which apparently occurred as a result of his stricture and led to the suprapubic catheter. His chemistry is otherwise unremarkable.[name] His urinalysis is grossly infected and consistent with the appearance of the catheter tubing itself. I am giving ceftriaxone 1 g IV which should have broad enough coverage to be appropriate for initial treatment of his UTI. The patient prefers to be transferred to you and see  if possible I believe this is appropriate. He does not meet sepsis criteria at this time  In spite of his leukocytosis and his  Lactic acid is within normal limits. I will give 1 L of IV fluids for his acute kidney injury and attempt to affect a transfer.   Clinical Course  Comment By Time  Holding to talk to [facility] ED regarding a transfer. Hinda Kehr,  MD 09/02 2254    (Note that documentation was delayed due to multiple ED patients requiring immediate care.)   I asked the transfer center if I could speak with the urologist on call but apparently they have a policy against talking to outside emergency departments and will only talk directly with the local urologist. That was not necessary or appropriate in this case so I spoke directly with Dr. Vernell Leep with the [facility] emergency department. She accepted the patient for transfer to the [facility] ED.  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, unspecified stricture type  Suprapubic catheter ([place])  Acute kidney injury ([place])     MEDICATIONS GIVEN DURING THIS VISIT:  Medications  ibuprofen (ADVIL,MOTRIN) 800 MG tablet (not administered)  sodium chloride 0.9 % bolus 1,000 mL (0 mLs Intravenous Stopped 02/02/16 2254)  cefTRIAXone (ROCEPHIN) 1 g in dextrose 5 % 50 mL IVPB (0 g Intravenous Stopped 02/02/16 2210)  0.9 %  sodium chloride infusion ( Intravenous New Bag/Given 02/02/16 2320)     NEW OUTPATIENT MEDICATIONS STARTED DURING THIS VISIT:  Discharge Medication List as of 02/03/2016 12:32 AM        Note:  This document was prepared using Dragon voice recognition software and may include unintentional dictation errors.    Hinda Kehr, MD 02/03/16 [phone]

## 2016-02-02 NOTE — ED Triage Notes (Signed)
Pt has RN that helps take care of the catheter. Pt scheduled for urethroplasty on 9/8 at [facility]. Follows urology at [facility].

## 2016-02-02 NOTE — ED Triage Notes (Addendum)
Pt arrived via EMS from a convenience store where he was picked up. Pt states for the past week he has been having lower abdominal pain off and on.  Pt describes the pain as sharp.  Pt states he was at the store buying visine. Pt is A/O x 4.  Pt has a suprapubic catheter that was placed 2 weeks ago at [facility].  Pt states he has noticed blood in the urine on Thursday.

## 2016-02-02 NOTE — ED Notes (Signed)
MD at bedside. 

## 2016-02-03 DIAGNOSIS — J449 Chronic obstructive pulmonary disease, unspecified: Secondary | ICD-10-CM | POA: Insufficient documentation

## 2016-02-03 LAB — BLOOD CULTURE ID PANEL (REFLEXED)

## 2016-02-03 NOTE — Progress Notes (Signed)
PHARMACY - PHYSICIAN COMMUNICATION CRITICAL VALUE ALERT - BLOOD CULTURE IDENTIFICATION (BCID)  Results for orders placed or performed during the hospital encounter of 02/02/16  Blood Culture ID Panel (Reflexed) (Collected: 02/02/2016  8:54 PM)  Result Value Ref Range   Enterococcus species NOT DETECTED NOT DETECTED   Listeria monocytogenes NOT DETECTED NOT DETECTED   Staphylococcus species DETECTED (A) NOT DETECTED   Staphylococcus aureus NOT DETECTED NOT DETECTED   Methicillin resistance DETECTED (A) NOT DETECTED   Streptococcus species NOT DETECTED NOT DETECTED   Streptococcus agalactiae NOT DETECTED NOT DETECTED   Streptococcus pneumoniae NOT DETECTED NOT DETECTED   Streptococcus pyogenes NOT DETECTED NOT DETECTED   Acinetobacter baumannii NOT DETECTED NOT DETECTED   Enterobacteriaceae species NOT DETECTED NOT DETECTED   Enterobacter cloacae complex NOT DETECTED NOT DETECTED   Escherichia coli NOT DETECTED NOT DETECTED   Klebsiella oxytoca NOT DETECTED NOT DETECTED   Klebsiella pneumoniae NOT DETECTED NOT DETECTED   Proteus species NOT DETECTED NOT DETECTED   Serratia marcescens NOT DETECTED NOT DETECTED   Haemophilus influenzae NOT DETECTED NOT DETECTED   Neisseria meningitidis NOT DETECTED NOT DETECTED   Pseudomonas aeruginosa NOT DETECTED NOT DETECTED   Candida albicans NOT DETECTED NOT DETECTED   Candida glabrata NOT DETECTED NOT DETECTED   Candida krusei NOT DETECTED NOT DETECTED   Candida parapsilosis NOT DETECTED NOT DETECTED   Candida tropicalis NOT DETECTED NOT DETECTED    Name of physician (or Provider) Contacted: N/A - patient was transferred to [facility] on 9/2 for care.  Changes to prescribed antibiotics required: N/A- patient was transferred and admitted to [facility] for care.  Dani Gobble Jule Whitsel 02/03/2016  7:53 PM

## 2016-02-04 LAB — URINE CULTURE: Special Requests: NORMAL

## 2016-02-05 LAB — CULTURE, BLOOD (ROUTINE X 2)

## 2016-02-07 LAB — CULTURE, BLOOD (ROUTINE X 2): Culture: NO GROWTH

## 2016-02-25 ENCOUNTER — Emergency Department
Admission: EM | Admit: 2016-02-25 | Discharge: 2016-02-25 | Disposition: A | Discharge status: Home / Self Care | Payer: Medicare Other | Source: Home / Self Care | Attending: Emergency Medicine | Admitting: Emergency Medicine

## 2016-02-25 DIAGNOSIS — E039 Hypothyroidism, unspecified: Secondary | ICD-10-CM

## 2016-02-25 DIAGNOSIS — J45909 Unspecified asthma, uncomplicated: Secondary | ICD-10-CM | POA: Insufficient documentation

## 2016-02-25 DIAGNOSIS — F17210 Nicotine dependence, cigarettes, uncomplicated: Secondary | ICD-10-CM | POA: Insufficient documentation

## 2016-02-25 DIAGNOSIS — N390 Urinary tract infection, site not specified: Secondary | ICD-10-CM | POA: Insufficient documentation

## 2016-02-25 LAB — CBC WITH DIFFERENTIAL/PLATELET
Basophils Absolute: 0.2 10*3/uL — ABNORMAL HIGH (ref 0–0.1)
Basophils Relative: 1 %
EOS PCT: 5 %
Eosinophils Absolute: 0.8 10*3/uL — ABNORMAL HIGH (ref 0–0.7)
HEMATOCRIT: 43.1 % (ref 40.0–52.0)
Hemoglobin: 14.6 g/dL (ref 13.0–18.0)
LYMPHS ABS: 2.4 10*3/uL (ref 1.0–3.6)
LYMPHS PCT: 17 %
MCH: 31 pg (ref 26.0–34.0)
MCHC: 33.8 g/dL (ref 32.0–36.0)
MCV: 91.8 fL (ref 80.0–100.0)
MONO ABS: 1.2 10*3/uL — AB (ref 0.2–1.0)
Monocytes Relative: 9 %
Neutro Abs: 9.7 10*3/uL — ABNORMAL HIGH (ref 1.4–6.5)
Neutrophils Relative %: 68 %
PLATELETS: 160 10*3/uL (ref 150–440)
RBC: 4.69 MIL/uL (ref 4.40–5.90)
RDW: 15.8 % — ABNORMAL HIGH (ref 11.5–14.5)
WBC: 14.2 10*3/uL — AB (ref 3.8–10.6)

## 2016-02-25 LAB — BASIC METABOLIC PANEL
Anion gap: 8 (ref 5–15)
BUN: 31 mg/dL — AB (ref 6–20)
CO2: 22 mmol/L (ref 22–32)
Calcium: 9.2 mg/dL (ref 8.9–10.3)
Chloride: 107 mmol/L (ref 101–111)
Creatinine, Ser: 2.31 mg/dL — ABNORMAL HIGH (ref 0.61–1.24)
GFR calc Af Amer: 33 mL/min — ABNORMAL LOW (ref >60)
GFR, EST NON AFRICAN AMERICAN: 29 mL/min — AB (ref >60)
GLUCOSE: 96 mg/dL (ref 65–99)
POTASSIUM: 3.8 mmol/L (ref 3.5–5.1)
Sodium: 137 mmol/L (ref 135–145)

## 2016-02-25 LAB — URINALYSIS COMPLETE WITH MICROSCOPIC (ARMC ONLY)
Bilirubin Urine: NEGATIVE
GLUCOSE, UA: NEGATIVE mg/dL
KETONES UR: NEGATIVE mg/dL
NITRITE: POSITIVE — AB
Protein, ur: 30 mg/dL — AB
SPECIFIC GRAVITY, URINE: 1.012 (ref 1.005–1.030)
pH: 8 (ref 5.0–8.0)

## 2016-02-25 MED ORDER — CIPROFLOXACIN HCL 500 MG PO TABS
500.0000 mg | ORAL_TABLET | Freq: Once | ORAL | Status: AC
  Administered 2016-02-25: 500 mg via ORAL
  Filled 2016-02-25: qty 1

## 2016-02-25 MED ORDER — SULFAMETHOXAZOLE-TRIMETHOPRIM 800-160 MG PO TABS
1.0000 | ORAL_TABLET | Freq: Once | ORAL | Status: AC
  Administered 2016-02-25: 1 via ORAL
  Filled 2016-02-25: qty 1

## 2016-02-25 MED ORDER — SULFAMETHOXAZOLE-TRIMETHOPRIM 400-80 MG PO TABS: 1.0000 | ORAL_TABLET | Freq: Two times a day (BID) | ORAL | 0 refills | Status: DC

## 2016-02-25 NOTE — Discharge Instructions (Signed)
You may have an infection around your urostomy site. If you have increased pain, redness, fever, or new or worrisome symptoms return to the emergency room. Take the antibiotics until they're gone and follow up closely with your urologist.

## 2016-02-25 NOTE — ED Triage Notes (Signed)
Pt c/o drainage around the suprapubic catheter and dark colored urine for the past couple of days and states "I think I have a bladder infection".

## 2016-02-25 NOTE — ED Notes (Addendum)
unsuccessful IV attempt x 2 by this RN. MD notified.

## 2016-02-25 NOTE — ED Provider Notes (Addendum)
[facility] Emergency Department Provider Note  ____________________________________________   I have reviewed the triage vital signs and the nursing notes.   HISTORY  Chief Complaint Urinary Tract Infection    HPI Alan Small is a 60 y.o. male with a history of urinary stricture with a super pubic catheter. He has noted some mild purulent discharge from around the catheter site for the last 3 days. This is the only symptom or complaint that he has.  Review of notes from outside hospital at [facility] suggested the patient has an Escherichia coli infection on their culture dated today and they want him to start Cipro. He is unaware of this.   Past Medical History:  Diagnosis Date  . Asthma   . Depression   . External hemorrhoids   . Gastric ulcer   . Hydronephrosis   . Hypothyroidism    hypo  . Insomnia   . Mental disorder   . Myocardial infarction ([place])   . Paranoid schizophrenia ([place])   . Renal disorder    renal failure  . Seizures ([place])   . Stroke [facility])     Patient Active Problem List   Diagnosis Date Noted  . Malnutrition of moderate degree 05/09/2015  . Sepsis ([place]) 05/06/2015  . UTI (lower urinary tract infection) 01/17/2015  . Hypoglycemia 01/17/2015  . Metabolic acidosis 94/85/4627  . Acute renal failure ([place]) 11/03/2012  . Hyperkalemia 11/03/2012  . Pyelonephritis 11/03/2012  . Schizophrenia ([place]) 11/03/2012  . Hypothyroidism 11/03/2012  . Severe sepsis ([place]) 11/03/2012  . Protein-calorie malnutrition, severe ([place]) 11/03/2012  . Encephalopathy acute 11/03/2012    Past Surgical History:  Procedure Laterality Date  . APPENDECTOMY    . COLONOSCOPY WITH PROPOFOL N/A 12/07/2014   Procedure: COLONOSCOPY WITH PROPOFOL;  Surgeon: Manya Silvas, MD;  Location: [facility] ENDOSCOPY;  Service: Endoscopy;  Laterality: N/A;  . ESOPHAGOGASTRODUODENOSCOPY N/A 12/07/2014   Procedure: ESOPHAGOGASTRODUODENOSCOPY (EGD);  Surgeon: Manya Silvas, MD;   Location: [facility] ENDOSCOPY;  Service: Endoscopy;  Laterality: N/A;  . NEPHRECTOMY    . SUPRAPUBIC CATHETER INSERTION      Prior to Admission medications   Medication Sig Start Date End Date Taking? Authorizing Provider  albuterol-ipratropium (COMBIVENT) 18-103 MCG/ACT inhaler Inhale 2 puffs into the lungs every 6 (six) hours as needed for wheezing or shortness of breath.     Historical Provider, MD  feeding supplement, ENSURE ENLIVE, (ENSURE ENLIVE) LIQD Take 237 mLs by mouth 2 (two) times daily with a meal. Patient not taking: Reported on 07/13/2015 05/11/15   Gladstone Lighter, MD  FLUoxetine (PROZAC) 40 MG capsule Take 40 mg by mouth daily.    Historical Provider, MD  Fluticasone-Salmeterol (ADVAIR) 250-50 MCG/DOSE AEPB Inhale 1 puff into the lungs 2 (two) times daily.    Historical Provider, MD  hydrocortisone (ANUSOL-HC) 2.5 % rectal cream Place rectally 2 (two) times daily. Patient not taking: Reported on 07/13/2015 05/11/15   Gladstone Lighter, MD  levofloxacin (LEVAQUIN) 750 MG tablet Take 1 tablet (750 mg total) by mouth daily. 07/13/15   Schuyler Amor, MD  levothyroxine (SYNTHROID, LEVOTHROID) 50 MCG tablet Take 50 mcg by mouth daily before breakfast.    Historical Provider, MD  opium-belladonna (B&O SUPPRETTES) 16.2-60 MG suppository Place 1 suppository rectally every 6 (six) hours as needed for bladder spasms. 05/11/15   Gladstone Lighter, MD  oxyCODONE (ROXICODONE) 5 MG immediate release tablet Take 1-2 tablets (5-10 mg total) by mouth every 6 (six) hours as needed for severe pain. 10/17/15  10/16/16  Anne-Caroline Mariea Clonts, MD  risperiDONE (RISPERDAL) 0.5 MG tablet Take 0.5 mg by mouth at bedtime. Pt takes with a 3mg  tablet.    Historical Provider, MD  risperiDONE (RISPERDAL) 3 MG tablet Take 3 mg by mouth at bedtime. Pt takes with a 0.5mg  tablet.    Historical Provider, MD  sodium bicarbonate 650 MG tablet Take 1,300 mg by mouth 2 (two) times daily.    Historical Provider, MD   sulfamethoxazole-trimethoprim (BACTRIM,SEPTRA) 400-80 MG tablet Take 1 tablet by mouth daily.    Historical Provider, MD  tamsulosin (FLOMAX) 0.4 MG CAPS capsule Take 0.4 mg by mouth at bedtime.     Historical Provider, MD    Allergies Mellaril [thioridazine]; Buprenorphine; Morphine and related; Navane [thiothixene]; and Thorazine [chlorpromazine]  Family History  Problem Relation Age of Onset  . Stroke Father   . Pneumonia Father   . Brain cancer Mother     Social History Social History  Substance Use Topics  . Smoking status: Current Every Day Smoker    Packs/day: 1.00    Years: 55.00    Types: Cigarettes  . Smokeless tobacco: Former Systems developer    Types: Chew  . Alcohol use No    Review of Systems Constitutional: No fever/chills Eyes: No visual changes. ENT: No sore throat. No stiff neck no neck pain Cardiovascular: Denies chest pain. Respiratory: Denies shortness of breath. Gastrointestinal:   no vomiting.  No diarrhea.  No constipation. Genitourinary: Negative for dysuria. Musculoskeletal: Negative lower extremity swelling Skin: Negative for rash. Neurological: Negative for severe headaches, focal weakness or numbness. 10-point ROS otherwise negative.  ____________________________________________   PHYSICAL EXAM:  VITAL SIGNS: ED Triage Vitals  Enc Vitals Group     BP 02/25/16 1806 105/73     Pulse Rate 02/25/16 1806 91     Resp 02/25/16 1806 18     Temp 02/25/16 1806 98.2 F (36.8 C)     Temp Source 02/25/16 1806 Oral     SpO2 02/25/16 1806 95 %     Weight 02/25/16 1806 156 lb (70.8 kg)     Height 02/25/16 1806 6' (1.829 m)     Head Circumference --      Peak Flow --      Pain Score 02/25/16 1817 0     Pain Loc --      Pain Edu? --      Excl. in [place]? --     Constitutional: Alert and oriented. Well appearing and in no acute distress. Eyes: Conjunctivae are normal. PERRL. EOMI. Head: Atraumatic. Nose: No congestion/rhinnorhea. Mouth/Throat: Mucous  membranes are moist.  Oropharynx non-erythematous. Neck: No stridor.   Nontender with no meningismus Cardiovascular: Normal rate, regular rhythm. Grossly normal heart sounds.  Good peripheral circulation. Respiratory: Normal respiratory effort.  No retractions. Lungs CTAB. Abdominal: Soft and nontender. No distention. No guarding no rebound, There is very mild purulence noted in drainage from around the urostomy tube. There is no straining cellulitis or erythema induration or fluctuance. Back:  There is no focal tenderness or step off.  there is no midline tenderness there are no lesions noted. there is no CVA tenderness Musculoskeletal: No lower extremity tenderness, no upper extremity tenderness. No joint effusions, no DVT signs strong distal pulses no edema Neurologic:  Normal speech and language. No gross focal neurologic deficits are appreciated.  Skin:  Skin is warm, dry and intact. There is a very slight chronic appearing fungal area related to his groin. Psychiatric: Mood and affect are  normal. Speech and behavior are normal.  ____________________________________________   LABS (all labs ordered are listed, but only abnormal results are displayed)  Labs Reviewed  URINALYSIS COMPLETEWITH MICROSCOPIC ([facility] ONLY) - Abnormal; Notable for the following:       Result Value   Color, Urine YELLOW (*)    APPearance CLOUDY (*)    Hgb urine dipstick 1+ (*)    Protein, ur 30 (*)    Nitrite POSITIVE (*)    Leukocytes, UA 3+ (*)    Bacteria, UA RARE (*)    Squamous Epithelial / LPF 0-5 (*)    All other components within normal limits  AEROBIC CULTURE (SUPERFICIAL SPECIMEN)  CBC WITH DIFFERENTIAL/PLATELET  BASIC METABOLIC PANEL   ____________________________________________  EKG  I personally interpreted any EKGs ordered by me or triage  ____________________________________________  RADIOLOGY  I reviewed any imaging ordered by me or triage that were performed during my shift and,  if possible, patient and/or family made aware of any abnormal findings. ____________________________________________   PROCEDURES  Procedure(s) performed: None  Procedures  Critical Care performed: None  ____________________________________________   INITIAL IMPRESSION / ASSESSMENT AND PLAN / ED COURSE  Pertinent labs & imaging results that were available during my care of the patient were reviewed by me and considered in my medical decision making (see chart for details).  Patient with a known Escherichia coli infection by culture according to notes from his urologist presents today with a slight purulent discharge from around his catheter site. No evidence of abscess cellulitis or systemic infection. We will start him on Cipro, I will culture the urine as well, we will check basic blood work to ensure no other pathology noted and hopefully we will be able to get him safely home with close outpatient follow-up.  ----------------------------------------- 8:58 PM on 02/25/2016 -----------------------------------------  The patient's culture actually speciated out mixed flora from [facility] which is now available to see by way of care everywhere. I discussed with [facility] urology Dr. Jerold Coombe, who feels that given that he does have some purulent drainage from around the site, Bactrim would be a good drug to take for the next week for him until he can see him. Accordingly, we will start him on Bactrim and not Cipro for the possibility of a soft tissue infection which seems to be more likely than a UTI given his symptoms we will send urine and discharge cultures and we'll discharge the patient home. White count is 14 but that is actually low for him and it is about what they had at [facility] when he was there a few days ago. Return precautions and follow-up given and understood.  Clinical Course   ____________________________________________   FINAL CLINICAL IMPRESSION(S) / ED DIAGNOSES  Final diagnoses:   None      This chart was dictated using voice recognition software.  Despite best efforts to proofread,  errors can occur which can change meaning.      Schuyler Amor, MD 02/25/16 [place], MD 02/25/16 2059

## 2016-02-26 ENCOUNTER — Inpatient Hospital Stay
Admission: EM | Admit: 2016-02-26 | Discharge: 2016-02-28 | DRG: 699 | Disposition: A | Discharge status: Other Acute Inpatient Hospital | Payer: Medicare Other | Attending: Specialist | Admitting: Specialist

## 2016-02-26 ENCOUNTER — Encounter: Payer: Self-pay | Admitting: Medical Oncology

## 2016-02-26 DIAGNOSIS — Z823 Family history of stroke: Secondary | ICD-10-CM

## 2016-02-26 DIAGNOSIS — Y848 Other medical procedures as the cause of abnormal reaction of the patient, or of later complication, without mention of misadventure at the time of the procedure: Secondary | ICD-10-CM | POA: Diagnosis present

## 2016-02-26 DIAGNOSIS — Z95828 Presence of other vascular implants and grafts: Secondary | ICD-10-CM

## 2016-02-26 DIAGNOSIS — Z79899 Other long term (current) drug therapy: Secondary | ICD-10-CM

## 2016-02-26 DIAGNOSIS — T83511A Infection and inflammatory reaction due to indwelling urethral catheter, initial encounter: Secondary | ICD-10-CM | POA: Diagnosis not present

## 2016-02-26 DIAGNOSIS — Z1624 Resistance to multiple antibiotics: Secondary | ICD-10-CM | POA: Diagnosis present

## 2016-02-26 DIAGNOSIS — F329 Major depressive disorder, single episode, unspecified: Secondary | ICD-10-CM | POA: Diagnosis present

## 2016-02-26 DIAGNOSIS — Z905 Acquired absence of kidney: Secondary | ICD-10-CM | POA: Diagnosis not present

## 2016-02-26 DIAGNOSIS — F17210 Nicotine dependence, cigarettes, uncomplicated: Secondary | ICD-10-CM | POA: Diagnosis present

## 2016-02-26 DIAGNOSIS — R1084 Generalized abdominal pain: Secondary | ICD-10-CM | POA: Diagnosis not present

## 2016-02-26 DIAGNOSIS — F200 Paranoid schizophrenia: Secondary | ICD-10-CM | POA: Diagnosis present

## 2016-02-26 DIAGNOSIS — Z23 Encounter for immunization: Secondary | ICD-10-CM | POA: Diagnosis not present

## 2016-02-26 DIAGNOSIS — E039 Hypothyroidism, unspecified: Secondary | ICD-10-CM | POA: Diagnosis present

## 2016-02-26 DIAGNOSIS — B965 Pseudomonas (aeruginosa) (mallei) (pseudomallei) as the cause of diseases classified elsewhere: Secondary | ICD-10-CM | POA: Diagnosis present

## 2016-02-26 DIAGNOSIS — N390 Urinary tract infection, site not specified: Secondary | ICD-10-CM | POA: Diagnosis present

## 2016-02-26 DIAGNOSIS — Z8673 Personal history of transient ischemic attack (TIA), and cerebral infarction without residual deficits: Secondary | ICD-10-CM

## 2016-02-26 DIAGNOSIS — I252 Old myocardial infarction: Secondary | ICD-10-CM | POA: Diagnosis not present

## 2016-02-26 DIAGNOSIS — Z9109 Other allergy status, other than to drugs and biological substances: Secondary | ICD-10-CM

## 2016-02-26 DIAGNOSIS — N359 Urethral stricture, unspecified: Secondary | ICD-10-CM | POA: Diagnosis present

## 2016-02-26 DIAGNOSIS — L03311 Cellulitis of abdominal wall: Secondary | ICD-10-CM | POA: Diagnosis present

## 2016-02-26 DIAGNOSIS — N136 Pyonephrosis: Secondary | ICD-10-CM | POA: Diagnosis present

## 2016-02-26 DIAGNOSIS — F32A Depression, unspecified: Secondary | ICD-10-CM | POA: Insufficient documentation

## 2016-02-26 DIAGNOSIS — D72829 Elevated white blood cell count, unspecified: Secondary | ICD-10-CM | POA: Diagnosis not present

## 2016-02-26 LAB — DIFFERENTIAL
Basophils Absolute: 0 10*3/uL (ref 0–0.1)
Basophils Relative: 0 %
EOS ABS: 0.6 10*3/uL (ref 0–0.7)
EOS PCT: 5 %
LYMPHS PCT: 18 %
Lymphs Abs: 2.1 10*3/uL (ref 1.0–3.6)
MONO ABS: 1 10*3/uL (ref 0.2–1.0)
Monocytes Relative: 9 %
Neutro Abs: 8.1 10*3/uL — ABNORMAL HIGH (ref 1.4–6.5)
Neutrophils Relative %: 68 %

## 2016-02-26 LAB — BASIC METABOLIC PANEL
Anion gap: 7 (ref 5–15)
BUN: 31 mg/dL — AB (ref 6–20)
CALCIUM: 9 mg/dL (ref 8.9–10.3)
CHLORIDE: 107 mmol/L (ref 101–111)
CO2: 22 mmol/L (ref 22–32)
CREATININE: 2.16 mg/dL — AB (ref 0.61–1.24)
GFR calc Af Amer: 36 mL/min — ABNORMAL LOW (ref >60)
GFR calc non Af Amer: 31 mL/min — ABNORMAL LOW (ref >60)
GLUCOSE: 96 mg/dL (ref 65–99)
Potassium: 4 mmol/L (ref 3.5–5.1)
Sodium: 136 mmol/L (ref 135–145)

## 2016-02-26 LAB — CBC
HCT: 43.6 % (ref 40.0–52.0)
Hemoglobin: 14.4 g/dL (ref 13.0–18.0)
MCH: 30.5 pg (ref 26.0–34.0)
MCHC: 33 g/dL (ref 32.0–36.0)
MCV: 92.4 fL (ref 80.0–100.0)
PLATELETS: 148 10*3/uL — AB (ref 150–440)
RBC: 4.72 MIL/uL (ref 4.40–5.90)
RDW: 16 % — AB (ref 11.5–14.5)
WBC: 11.8 10*3/uL — ABNORMAL HIGH (ref 3.8–10.6)

## 2016-02-26 LAB — MRSA PCR SCREENING: MRSA by PCR: NEGATIVE

## 2016-02-26 MED ORDER — LEVOTHYROXINE SODIUM 50 MCG PO TABS
50.0000 ug | ORAL_TABLET | Freq: Every day | ORAL | Status: DC
Start: 2016-02-27 — End: 2016-02-28
  Administered 2016-02-27 – 2016-02-28 (×2): 50 ug via ORAL
  Filled 2016-02-26 (×2): qty 1

## 2016-02-26 MED ORDER — ONDANSETRON HCL 4 MG PO TABS
4.0000 mg | ORAL_TABLET | Freq: Four times a day (QID) | ORAL | Status: DC | PRN
Start: 2016-02-26 — End: 2016-02-28

## 2016-02-26 MED ORDER — SODIUM CHLORIDE 0.9 % IV SOLN
INTRAVENOUS | Status: DC
  Administered 2016-02-26 – 2016-02-28 (×5): via INTRAVENOUS

## 2016-02-26 MED ORDER — DEXTROSE 5 % IV SOLN
2.0000 g | INTRAVENOUS | Status: DC
  Administered 2016-02-27: 2 g via INTRAVENOUS
  Filled 2016-02-26 (×2): qty 2

## 2016-02-26 MED ORDER — RISPERIDONE 3 MG PO TABS
3.0000 mg | ORAL_TABLET | Freq: Every day | ORAL | Status: DC
  Administered 2016-02-26 – 2016-02-27 (×2): 3 mg via ORAL
  Filled 2016-02-26 (×3): qty 1

## 2016-02-26 MED ORDER — SODIUM BICARBONATE 650 MG PO TABS
650.0000 mg | ORAL_TABLET | Freq: Two times a day (BID) | ORAL | Status: DC
  Administered 2016-02-26 – 2016-02-28 (×4): 650 mg via ORAL
  Filled 2016-02-26 (×4): qty 1

## 2016-02-26 MED ORDER — FLUOXETINE HCL 20 MG PO CAPS
60.0000 mg | ORAL_CAPSULE | ORAL | Status: DC
  Administered 2016-02-27 – 2016-02-28 (×2): 60 mg via ORAL
  Filled 2016-02-26 (×2): qty 3

## 2016-02-26 MED ORDER — DEXTROSE 5 % IV SOLN
1.0000 g | Freq: Once | INTRAVENOUS | Status: AC
  Administered 2016-02-26: 21:00:00 1 g via INTRAVENOUS
  Filled 2016-02-26: qty 1

## 2016-02-26 MED ORDER — OXYCODONE-ACETAMINOPHEN 5-325 MG PO TABS
1.0000 | ORAL_TABLET | Freq: Four times a day (QID) | ORAL | Status: DC | PRN
  Administered 2016-02-26: 19:00:00 1 via ORAL
  Administered 2016-02-27: 08:00:00 2 via ORAL
  Administered 2016-02-27: 01:00:00 1 via ORAL
  Filled 2016-02-26 (×2): qty 1
  Filled 2016-02-26: qty 2
  Filled 2016-02-26: qty 1

## 2016-02-26 MED ORDER — ENOXAPARIN SODIUM 40 MG/0.4ML ~~LOC~~ SOLN
40.0000 mg | SUBCUTANEOUS | Status: DC
  Administered 2016-02-26 – 2016-02-27 (×2): 40 mg via SUBCUTANEOUS
  Filled 2016-02-26 (×2): qty 0.4

## 2016-02-26 MED ORDER — TAMSULOSIN HCL 0.4 MG PO CAPS
0.4000 mg | ORAL_CAPSULE | ORAL | Status: DC
  Administered 2016-02-27 – 2016-02-28 (×2): 0.4 mg via ORAL
  Filled 2016-02-26 (×2): qty 1

## 2016-02-26 MED ORDER — IPRATROPIUM-ALBUTEROL 0.5-2.5 (3) MG/3ML IN SOLN: 3.0000 mL | Freq: Four times a day (QID) | RESPIRATORY_TRACT | Status: DC | PRN

## 2016-02-26 MED ORDER — DEXTROSE 5 % IV SOLN: 2.0000 g | INTRAVENOUS | Status: DC

## 2016-02-26 MED ORDER — ONDANSETRON HCL 4 MG/2ML IJ SOLN: 4.0000 mg | Freq: Four times a day (QID) | INTRAMUSCULAR | Status: DC | PRN

## 2016-02-26 MED ORDER — DARIFENACIN HYDROBROMIDE ER 7.5 MG PO TB24
7.5000 mg | ORAL_TABLET | Freq: Every day | ORAL | Status: DC
  Administered 2016-02-27 – 2016-02-28 (×2): 7.5 mg via ORAL
  Filled 2016-02-26 (×2): qty 1

## 2016-02-26 MED ORDER — CEFEPIME HCL 1 G IJ SOLR
1.0000 g | Freq: Once | INTRAMUSCULAR | Status: AC
  Administered 2016-02-26: 1 g via INTRAVENOUS
  Filled 2016-02-26: qty 1

## 2016-02-26 MED ORDER — RISPERIDONE 0.5 MG PO TABS
0.5000 mg | ORAL_TABLET | Freq: Every day | ORAL | Status: DC
  Administered 2016-02-26 – 2016-02-27 (×2): 0.5 mg via ORAL
  Filled 2016-02-26 (×3): qty 1

## 2016-02-26 MED ORDER — ACETAMINOPHEN 325 MG PO TABS: 650.0000 mg | ORAL_TABLET | Freq: Four times a day (QID) | ORAL | Status: DC | PRN

## 2016-02-26 MED ORDER — ACETAMINOPHEN 650 MG RE SUPP: 650.0000 mg | Freq: Four times a day (QID) | RECTAL | Status: DC | PRN

## 2016-02-26 MED ORDER — INFLUENZA VAC SPLIT QUAD 0.5 ML IM SUSY
0.5000 mL | PREFILLED_SYRINGE | INTRAMUSCULAR | Status: AC
Start: 2016-02-27 — End: 2016-02-27
  Administered 2016-02-27: 10:00:00 0.5 mL via INTRAMUSCULAR
  Filled 2016-02-26: qty 0.5

## 2016-02-26 MED ORDER — DEXTROSE 5 % IV SOLN
1.0000 g | Freq: Once | INTRAVENOUS | Status: DC
  Filled 2016-02-26: qty 1

## 2016-02-26 NOTE — ED Provider Notes (Signed)
[facility] Emergency Department Provider Note   ____________________________________________   First MD Initiated Contact with Patient 02/26/16 1433     (approximate)  I have reviewed the triage vital signs and the nursing notes.   HISTORY  Chief Complaint Dehydration    HPI Alan Small is a 60 y.o. male who had a suprapubic catheter placed 2 weeks ago who is presenting to the emergency department today because of the need for IV antibiotics. He was treated here last night for appeared to be a surrounding cellulitis with Bactrim. However, he was followed up by his primary care doctor who had sent of urine culture which grew pseudomonas that has an extended resistance.   The patient denies any fever but does state that he has lower abdominal pain as well as pus from around his catheter which was placed 2 weeks ago at [facility].   Past Medical History:  Diagnosis Date  . Asthma   . Depression   . External hemorrhoids   . Gastric ulcer   . Hydronephrosis   . Hypothyroidism    hypo  . Insomnia   . Mental disorder   . Myocardial infarction ([place])   . Paranoid schizophrenia ([place])   . Renal disorder    renal failure  . Seizures ([place])   . Stroke [facility])     Patient Active Problem List   Diagnosis Date Noted  . Malnutrition of moderate degree 05/09/2015  . Sepsis ([place]) 05/06/2015  . UTI (lower urinary tract infection) 01/17/2015  . Hypoglycemia 01/17/2015  . Metabolic acidosis 25/85/2778  . Acute renal failure ([place]) 11/03/2012  . Hyperkalemia 11/03/2012  . Pyelonephritis 11/03/2012  . Schizophrenia ([place]) 11/03/2012  . Hypothyroidism 11/03/2012  . Severe sepsis ([place]) 11/03/2012  . Protein-calorie malnutrition, severe ([place]) 11/03/2012  . Encephalopathy acute 11/03/2012    Past Surgical History:  Procedure Laterality Date  . APPENDECTOMY    . COLONOSCOPY WITH PROPOFOL N/A 12/07/2014   Procedure: COLONOSCOPY WITH PROPOFOL;  Surgeon:  Manya Silvas, MD;  Location: [facility] ENDOSCOPY;  Service: Endoscopy;  Laterality: N/A;  . ESOPHAGOGASTRODUODENOSCOPY N/A 12/07/2014   Procedure: ESOPHAGOGASTRODUODENOSCOPY (EGD);  Surgeon: Manya Silvas, MD;  Location: [facility] ENDOSCOPY;  Service: Endoscopy;  Laterality: N/A;  . NEPHRECTOMY    . SUPRAPUBIC CATHETER INSERTION      Prior to Admission medications   Medication Sig Start Date End Date Taking? Authorizing Provider  albuterol-ipratropium (COMBIVENT) 18-103 MCG/ACT inhaler Inhale 2 puffs into the lungs every 6 (six) hours as needed for wheezing or shortness of breath.     Historical Provider, MD  feeding supplement, ENSURE ENLIVE, (ENSURE ENLIVE) LIQD Take 237 mLs by mouth 2 (two) times daily with a meal. Patient not taking: Reported on 07/13/2015 05/11/15   Gladstone Lighter, MD  FLUoxetine (PROZAC) 40 MG capsule Take 40 mg by mouth daily.    Historical Provider, MD  Fluticasone-Salmeterol (ADVAIR) 250-50 MCG/DOSE AEPB Inhale 1 puff into the lungs 2 (two) times daily.    Historical Provider, MD  hydrocortisone (ANUSOL-HC) 2.5 % rectal cream Place rectally 2 (two) times daily. Patient not taking: Reported on 07/13/2015 05/11/15   Gladstone Lighter, MD  levofloxacin (LEVAQUIN) 750 MG tablet Take 1 tablet (750 mg total) by mouth daily. 07/13/15   Schuyler Amor, MD  levothyroxine (SYNTHROID, LEVOTHROID) 50 MCG tablet Take 50 mcg by mouth daily before breakfast.    Historical Provider, MD  opium-belladonna (B&O SUPPRETTES) 16.2-60 MG suppository Place 1 suppository rectally every 6 (  six) hours as needed for bladder spasms. 05/11/15   Gladstone Lighter, MD  oxyCODONE (ROXICODONE) 5 MG immediate release tablet Take 1-2 tablets (5-10 mg total) by mouth every 6 (six) hours as needed for severe pain. 10/17/15 10/16/16  Anne-Caroline Mariea Clonts, MD  risperiDONE (RISPERDAL) 0.5 MG tablet Take 0.5 mg by mouth at bedtime. Pt takes with a 3mg  tablet.    Historical Provider, MD  risperiDONE (RISPERDAL) 3 MG  tablet Take 3 mg by mouth at bedtime. Pt takes with a 0.5mg  tablet.    Historical Provider, MD  sodium bicarbonate 650 MG tablet Take 1,300 mg by mouth 2 (two) times daily.    Historical Provider, MD  sulfamethoxazole-trimethoprim (BACTRIM,SEPTRA) 400-80 MG tablet Take 1 tablet by mouth 2 (two) times daily. 02/25/16   Schuyler Amor, MD  tamsulosin (FLOMAX) 0.4 MG CAPS capsule Take 0.4 mg by mouth at bedtime.     Historical Provider, MD    Allergies Mellaril [thioridazine]; Buprenorphine; Morphine and related; Navane [thiothixene]; and Thorazine [chlorpromazine]  Family History  Problem Relation Age of Onset  . Stroke Father   . Pneumonia Father   . Brain cancer Mother     Social History Social History  Substance Use Topics  . Smoking status: Current Every Day Smoker    Packs/day: 1.00    Years: 55.00    Types: Cigarettes  . Smokeless tobacco: Former Systems developer    Types: Chew  . Alcohol use No    Review of Systems Constitutional: No fever/chills Eyes: No visual changes. ENT: No sore throat. Cardiovascular: Denies chest pain. Respiratory: Denies shortness of breath. Gastrointestinal: No abdominal pain.  No nausea, no vomiting.   Genitourinary:  Patient says that he also has burning upon urination from his penis. He says that he still urinates from his penis when his leg bag overflows. Musculoskeletal: Negative for back pain. Skin: Negative for rash. Neurological: Negative for headaches, focal weakness or numbness.  10-point ROS otherwise negative.  ____________________________________________   PHYSICAL EXAM:  VITAL SIGNS: ED Triage Vitals [02/26/16 1352]  Enc Vitals Group     BP (!) 133/99     Pulse Rate 81     Resp 19     Temp 98.2 F (36.8 C)     Temp Source Oral     SpO2 97 %     Weight 156 lb (70.8 kg)     Height 6' (1.829 m)     Head Circumference      Peak Flow      Pain Score 10     Pain Loc      Pain Edu?      Excl. in [place]?     Constitutional: Alert  and oriented. Well appearing and in no acute distress. Eyes: Conjunctivae are normal. PERRL. EOMI. Head: Atraumatic. Nose: No congestion/rhinnorhea. Mouth/Throat: Mucous membranes are moist.   Neck: No stridor.   Cardiovascular: Normal rate, regular rhythm. Grossly normal heart sounds.   Respiratory: Normal respiratory effort.  No retractions. Lungs CTAB. Gastrointestinal: Soft with mild lower abdominal tenderness to palpation. No distention.  Musculoskeletal: No lower extremity tenderness nor edema.  No joint effusions. Neurologic:  Normal speech and language. No gross focal neurologic deficits are appreciated. No gait instability. Skin:  Mild erythema without induration the lower abdomen surrounding the Foley site with a small amount of purulent material surrounding the suprapubic catheter stoma. There is also mild tenderness at this site. Sediment in the urine which is hazy. Psychiatric: Mood and affect are normal.  Speech and behavior are normal.  ____________________________________________   LABS (all labs ordered are listed, but only abnormal results are displayed)  Labs Reviewed  CBC WITH DIFFERENTIAL/PLATELET  BASIC METABOLIC PANEL   ____________________________________________  EKG   ____________________________________________  RADIOLOGY   ____________________________________________   PROCEDURES  Procedure(s) performed:   Procedures  Critical Care performed:   ____________________________________________   INITIAL IMPRESSION / ASSESSMENT AND PLAN / ED COURSE  Pertinent labs & imaging results that were available during my care of the patient were reviewed by me and considered in my medical decision making (see chart for details).  ----------------------------------------- 4:10 PM on 02/26/2016 -----------------------------------------  Patient with pseudomonas with extended resistance Bactrim. We'll treat with cefepime with an extended resistance  spectrum. I discussed the case with Dr. Ottis Stain of urology at [facility].  He recommends admission to the hospital with IV antibiotics. We also discussed replacing the suprapubic catheter and he recommended an IR consult for this. He also mentioned a possible PICC line. I explained the plan with the patient for admission and he understands and is willing to comply. Signed out to Dr. Anselm Jungling of the hospitalist service.  Clinical Course     ____________________________________________   FINAL CLINICAL IMPRESSION(S) / ED DIAGNOSES  UTI. Abdominal wall cellulitis.    NEW MEDICATIONS STARTED DURING THIS VISIT:  New Prescriptions   No medications on file     Note:  This document was prepared using Dragon voice recognition software and may include unintentional dictation errors.    Orbie Pyo, MD 02/26/16 [phone]

## 2016-02-26 NOTE — H&P (Signed)
[place] [facility] [place] NAME: Alan Small    MR#:  [id number]  DATE OF BIRTH:  02-May-1956  DATE OF ADMISSION:  02/26/2016  PRIMARY CARE PHYSICIAN: [name], MD   REQUESTING/REFERRING PHYSICIAN: Dr. Larae Grooms  CHIEF COMPLAINT:   Chief Complaint  Patient presents with  . Dehydration    HISTORY OF PRESENT ILLNESS:  Alan Small  is a 60 y.o. male with a known history of Paranoid schizophrenia, history of some previous CVA, seizures, hypothyroidism, depression who presented to the hospital due to abdominal pain and also drainage near his suprapubic catheter. Patient has a urethral stricture and had a suprapubic catheter placed at [facility] earlier this month. Over the past few days he has noticed some lower abdominal pain associated with drainage to a suprapubic catheter site. He came to the emergency room yesterday and was given a dose of IV antibiotics and placed on oral Bactrim and discharged home. He returns back today as his abdominal pain has not improved and he continues to have drainage there is superpubic catheter site. His primary care physician also did a urinalysis and urine culture came back positive for Pseudomonas which is multidrug resistant. Given his multidrug resistant UTI and ongoing drainage from his suprapubic catheter site hospitalist services were contacted further treatment and evaluation.  PAST MEDICAL HISTORY:   Past Medical History:  Diagnosis Date  . Asthma   . Depression   . External hemorrhoids   . Gastric ulcer   . Hydronephrosis   . Hypothyroidism    hypo  . Insomnia   . Mental disorder   . Myocardial infarction ([place])   . Paranoid schizophrenia ([place])   . Renal disorder    renal failure  . Seizures ([place])   . Stroke [facility])     PAST SURGICAL HISTORY:   Past Surgical History:  Procedure Laterality Date  . APPENDECTOMY    . COLONOSCOPY WITH PROPOFOL N/A 12/07/2014   Procedure: COLONOSCOPY  WITH PROPOFOL;  Surgeon: Manya Silvas, MD;  Location: [facility] ENDOSCOPY;  Service: Endoscopy;  Laterality: N/A;  . ESOPHAGOGASTRODUODENOSCOPY N/A 12/07/2014   Procedure: ESOPHAGOGASTRODUODENOSCOPY (EGD);  Surgeon: Manya Silvas, MD;  Location: [facility] ENDOSCOPY;  Service: Endoscopy;  Laterality: N/A;  . NEPHRECTOMY    . SUPRAPUBIC CATHETER INSERTION      SOCIAL HISTORY:   Social History  Substance Use Topics  . Smoking status: Current Every Day Smoker    Packs/day: 1.00    Years: 55.00    Types: Cigarettes  . Smokeless tobacco: Former Systems developer    Types: Chew  . Alcohol use No    FAMILY HISTORY:   Family History  Problem Relation Age of Onset  . Stroke Father   . Pneumonia Father   . Brain cancer Mother     DRUG ALLERGIES:   Allergies  Allergen Reactions  . Mellaril [Thioridazine] Shortness Of Breath and Swelling  . Buprenorphine Itching  . Morphine And Related Itching  . Navane [Thiothixene] Other (See Comments)    Reaction:  GI upset   . Thorazine [Chlorpromazine] Other (See Comments)    Reaction:  Dizziness/fainting     REVIEW OF SYSTEMS:   Review of Systems  Constitutional: Negative for fever and weight loss.  HENT: Negative for congestion, nosebleeds and tinnitus.   Eyes: Negative for blurred vision, double vision and redness.  Respiratory: Negative for cough, hemoptysis and shortness of breath.   Cardiovascular: Negative for chest pain, orthopnea, leg swelling  and PND.  Gastrointestinal: Positive for abdominal pain and nausea. Negative for diarrhea, melena and vomiting.  Genitourinary: Negative for dysuria, hematuria and urgency.  Musculoskeletal: Negative for falls and joint pain.  Neurological: Negative for dizziness, tingling, sensory change, focal weakness, seizures, weakness and headaches.  Endo/Heme/Allergies: Negative for polydipsia. Does not bruise/bleed easily.  Psychiatric/Behavioral: Negative for depression and memory loss. The patient is not  nervous/anxious.     MEDICATIONS AT HOME:   Prior to Admission medications   Medication Sig Start Date End Date Taking? Authorizing Provider  acetaminophen (TYLENOL) 500 MG tablet Take 1,000 mg by mouth every 6 (six) hours as needed for mild pain, moderate pain or fever.   Yes Historical Provider, MD  albuterol-ipratropium (COMBIVENT) 18-103 MCG/ACT inhaler Inhale 1 puff into the lungs every 6 (six) hours as needed for wheezing or shortness of breath.    Yes Historical Provider, MD  FLUoxetine (PROZAC) 20 MG capsule Take 60 mg by mouth every morning.   Yes Historical Provider, MD  hydrocortisone (ANUSOL-HC) 2.5 % rectal cream Place rectally 2 (two) times daily. Patient taking differently: Place 1 application rectally 2 (two) times daily as needed. For hemorrhoids 05/11/15  Yes Gladstone Lighter, MD  levothyroxine (SYNTHROID, LEVOTHROID) 50 MCG tablet Take 50 mcg by mouth daily before breakfast.   Yes Historical Provider, MD  opium-belladonna (B&O SUPPRETTES) 16.2-60 MG suppository Place 1 suppository rectally every 6 (six) hours as needed for bladder spasms. 05/11/15  Yes Gladstone Lighter, MD  risperiDONE (RISPERDAL) 0.5 MG tablet Take 0.5 mg by mouth at bedtime. Pt takes with a 3mg  tablet.   Yes Historical Provider, MD  risperiDONE (RISPERDAL) 3 MG tablet Take 3 mg by mouth at bedtime. Pt takes with a 0.5mg  tablet.   Yes Historical Provider, MD  sodium bicarbonate 650 MG tablet Take 650 mg by mouth 2 (two) times daily.    Yes Historical Provider, MD  sodium chloride 0.9 % infusion Inject 10 mLs into the vein 2 (two) times daily. *Infuse in catheter*   Yes Historical Provider, MD  solifenacin (VESICARE) 5 MG tablet Take 5 mg by mouth every morning.   Yes Historical Provider, MD  tamsulosin (FLOMAX) 0.4 MG CAPS capsule Take 0.4 mg by mouth every morning.    Yes Historical Provider, MD  feeding supplement, ENSURE ENLIVE, (ENSURE ENLIVE) LIQD Take 237 mLs by mouth 2 (two) times daily with a  meal. Patient not taking: Reported on 07/13/2015 05/11/15   Gladstone Lighter, MD  levofloxacin (LEVAQUIN) 750 MG tablet Take 1 tablet (750 mg total) by mouth daily. 07/13/15   Schuyler Amor, MD  oxyCODONE (ROXICODONE) 5 MG immediate release tablet Take 1-2 tablets (5-10 mg total) by mouth every 6 (six) hours as needed for severe pain. Patient not taking: Reported on 02/26/2016 10/17/15 10/16/16  Eula Listen, MD  sulfamethoxazole-trimethoprim (BACTRIM,SEPTRA) 400-80 MG tablet Take 1 tablet by mouth 2 (two) times daily. 02/25/16   Schuyler Amor, MD      VITAL SIGNS:  Blood pressure 124/85, pulse 63, temperature 98.2 F (36.8 C), temperature source Oral, resp. rate 18, height 6' (1.829 m), weight 70.8 kg (156 lb), SpO2 96 %.  PHYSICAL EXAMINATION:  Physical Exam  GENERAL:  59 y.o.-year-old patient lying in the bed inno acute distress.  EYES: Pupils equal, round, reactive to light and accommodation. No scleral icterus. Extraocular muscles intact.  HEENT: Head atraumatic, normocephalic. Oropharynx and nasopharynx clear. No oropharyngeal erythema, moist oral mucosa  NECK:  Supple, no jugular venous distention. No  thyroid enlargement, no tenderness.  LUNGS: Normal breath sounds bilaterally, no wheezing, rales, rhonchi. No use of accessory muscles of respiration.  CARDIOVASCULAR: S1, S2 RRR. No murmurs, rubs, gallops, clicks.  ABDOMEN: Soft, nontender, nondistended. Bowel sounds present. No organomegaly or mass. Positive suprapubic catheter in place with clear urine draining. Mild induration near the site but no acute drainage noted. EXTREMITIES: No pedal edema, cyanosis, or clubbing. + 2 pedal & radial pulses b/l.   NEUROLOGIC: Cranial nerves II through XII are intact. No focal Motor or sensory deficits appreciated b/l.   PSYCHIATRIC: The patient is alert and oriented x 3. Good affect.  SKIN: No obvious rash, lesion, or ulcer.   LABORATORY PANEL:   CBC  Recent Labs Lab 02/26/16 1641   WBC 11.8*  HGB 14.4  HCT 43.6  PLT 148*   ------------------------------------------------------------------------------------------------------------------  Chemistries   Recent Labs Lab 02/26/16 1600  NA 136  K 4.0  CL 107  CO2 22  GLUCOSE 96  BUN 31*  CREATININE 2.16*  CALCIUM 9.0   ------------------------------------------------------------------------------------------------------------------  Cardiac Enzymes No results for input(s): TROPONINI in the last 168 hours. ------------------------------------------------------------------------------------------------------------------  RADIOLOGY:  No results found.   IMPRESSION AND PLAN:   60 year old male with past medical history of paranoid schizophrenia, BPH, previous CVA, history of seizures, previous history of MI, hypothyroidism, depression who presented to the hospital due to abdominal pain and also then is near his suprapubic catheter site.  1. Urinary tract infection-patient's urine culture as an outpatient was positive for multidrug resistant Pseudomonas. -I will place the patient on IV cefepime. -We'll get infectious disease consult to see if the patient needs long-term IV antibiotics  -Clinically patient is afebrile and hemodynamically stable. Next  2. History of urethral stricture status post superior catheter-patient had this done earlier this month. He has noted some drainage around his suprapubic insertion site. -The ER physician discussed with the urologist at [facility] who recommended possible replacement of the suprapubic catheter. -I have discussed with urology here and he'll evaluate the patient tomorrow to see if this needs to be changed. -Continue Flomax.  3. Leukocytosis-secondary to the urinary tract infection-follow with IV antibiotic therapy.  4. Hypothyroidism-continue Synthroid.  5. History of paranoid schizophrenia-continue Risperdal.  6. Depression-continue Prozac.  All the records are  reviewed and case discussed with ED provider. Management plans discussed with the patient, family and they are in agreement.  CODE STATUS: Full  TOTAL TIME TAKING CARE OF THIS PATIENT: 50 minutes.    Henreitta Leber M.D on 02/26/2016 at 5:26 PM  Between 7am to 6pm - Pager - [phone]  After 6pm go to [email or web address] - password [email or web address] [facility]  Office  [phone]  CC: Primary care physician; [name], MD

## 2016-02-26 NOTE — ED Notes (Signed)
Spoke with Dr Kerman Passey about pt and he stated that pt did not need additional labs drawn and that pt could be seen in [place].

## 2016-02-26 NOTE — Progress Notes (Signed)
Pharmacy Antibiotic Note  Alan Small is a 60 y.o. male admitted on 02/26/2016 with UTI.  Pharmacy has been consulted for cefepime dosing.   Patient has a history of resistant UTI.  Plan: Cefepime 1 g dose given in ED. Will give another 1 g dose tonight and begin cefepime 2 g IV q24h tomorrow evening.  Height: 6' (182.9 cm) Weight: 153 lb 4.8 oz (69.5 kg) IBW/kg (Calculated) : 77.6  Temp (24hrs), Avg:98.2 F (36.8 C), Min:97.9 F (36.6 C), Max:98.4 F (36.9 C)   Recent Labs Lab 02/25/16 1955 02/26/16 1600 02/26/16 1641  WBC 14.2*  --  11.8*  CREATININE 2.31* 2.16*  --     Estimated Creatinine Clearance: 35.3 mL/min (by C-G formula based on SCr of 2.16 mg/dL (H)).    Allergies  Allergen Reactions  . Mellaril [Thioridazine] Shortness Of Breath and Swelling  . Buprenorphine Itching  . Morphine And Related Itching  . Navane [Thiothixene] Other (See Comments)    Reaction:  GI upset   . Thorazine [Chlorpromazine] Other (See Comments)    Reaction:  Dizziness/fainting    Antimicrobials this admission: cefepime 9/26 >>   Dose adjustments this admission:  Microbiology results:  9/26: Urine culture ordered  Thank you for allowing pharmacy to be a part of this patient's care.  Lenis Noon, PharmD, BCPS Clinical Pharmacist 02/26/2016 6:11 PM

## 2016-02-26 NOTE — Progress Notes (Signed)
Pharmacist approved Percocet order with me after review.

## 2016-02-26 NOTE — ED Triage Notes (Signed)
Pt reports he was seen here last night with urinary infection, reports he was called back last night and told to come back to er bc he needed IV fluids. Pt was placed on abx.

## 2016-02-26 NOTE — ED Notes (Signed)
Pt states pain around suprapubic catheter. States pus was coming out last night. Pt tender to touch. Still draining. Pt states pain in penis as well, hx of kidney stones and UTI.

## 2016-02-27 ENCOUNTER — Inpatient Hospital Stay: Payer: Medicare Other

## 2016-02-27 DIAGNOSIS — N390 Urinary tract infection, site not specified: Secondary | ICD-10-CM

## 2016-02-27 DIAGNOSIS — R1084 Generalized abdominal pain: Secondary | ICD-10-CM

## 2016-02-27 DIAGNOSIS — D72829 Elevated white blood cell count, unspecified: Secondary | ICD-10-CM

## 2016-02-27 LAB — BASIC METABOLIC PANEL
ANION GAP: 9 (ref 5–15)
BUN: 29 mg/dL — ABNORMAL HIGH (ref 6–20)
CALCIUM: 8.8 mg/dL — AB (ref 8.9–10.3)
CO2: 21 mmol/L — ABNORMAL LOW (ref 22–32)
CREATININE: 1.99 mg/dL — AB (ref 0.61–1.24)
Chloride: 108 mmol/L (ref 101–111)
GFR, EST AFRICAN AMERICAN: 40 mL/min — AB (ref >60)
GFR, EST NON AFRICAN AMERICAN: 34 mL/min — AB (ref >60)
Glucose, Bld: 97 mg/dL (ref 65–99)
Potassium: 4.4 mmol/L (ref 3.5–5.1)
SODIUM: 138 mmol/L (ref 135–145)

## 2016-02-27 LAB — URINE CULTURE

## 2016-02-27 LAB — CBC
HCT: 39.7 % — ABNORMAL LOW (ref 40.0–52.0)
Hemoglobin: 13.7 g/dL (ref 13.0–18.0)
MCH: 31.9 pg (ref 26.0–34.0)
MCHC: 34.6 g/dL (ref 32.0–36.0)
MCV: 92.5 fL (ref 80.0–100.0)
PLATELETS: 123 10*3/uL — AB (ref 150–440)
RBC: 4.3 MIL/uL — AB (ref 4.40–5.90)
RDW: 16.5 % — ABNORMAL HIGH (ref 11.5–14.5)
WBC: 9 10*3/uL (ref 3.8–10.6)

## 2016-02-27 MED ORDER — CLOTRIMAZOLE 1 % EX CREA
TOPICAL_CREAM | Freq: Two times a day (BID) | CUTANEOUS | Status: DC
  Administered 2016-02-27 – 2016-02-28 (×3): via TOPICAL
  Filled 2016-02-27: qty 28.35

## 2016-02-27 MED ORDER — OXYCODONE-ACETAMINOPHEN 5-325 MG PO TABS
2.0000 | ORAL_TABLET | Freq: Four times a day (QID) | ORAL | Status: DC | PRN
  Administered 2016-02-28: 2 via ORAL
  Filled 2016-02-27: qty 2

## 2016-02-27 MED ORDER — OXYCODONE-ACETAMINOPHEN 5-325 MG PO TABS
1.0000 | ORAL_TABLET | Freq: Four times a day (QID) | ORAL | Status: DC | PRN
  Administered 2016-02-27: 1 via ORAL
  Filled 2016-02-27: qty 1

## 2016-02-27 MED ORDER — NICOTINE 21 MG/24HR TD PT24
21.0000 mg | MEDICATED_PATCH | Freq: Every day | TRANSDERMAL | Status: DC
Start: 2016-02-27 — End: 2016-02-28
  Administered 2016-02-27 – 2016-02-28 (×2): 21 mg via TRANSDERMAL
  Filled 2016-02-27 (×2): qty 1

## 2016-02-27 NOTE — Care Management (Addendum)
Admitted to [facility] with the diagnosis of urinary tract infection. Lives at [facility]. Likes living at this facility. Sister is Marlowe Kays [phone]). She lives in [place] per Mr. Roselie Awkward. Last seen Dr. Rebeca Alert at [facility] last Friday. Prescriptions are filled at [facility]. Last fall was 3 weeks ago. Good appetite. Takes care of all basic activities of daily living himself. Uses a cane to aid in ambulation.  Shelbie Ammons RN MSN CCM Care Management [phone]

## 2016-02-27 NOTE — Consult Note (Signed)
Consult: UTI specifically to change SP tube  History of Present Illness: Alan Small is a pleasant 60 y.o. male with a history of urethral stricture who is scheduled to undergo anterior urethroplasty with Dr. Mayer Camel on 02/29/2016. He was admitted with abd pain, elevated wbc count and poss UTI. He feels better this AM and is eating and drinking normally. Also, normal flatus and has had a recent BM. SP appears to have been placed by [facility] IR 8/17.   He has an appt tomorrow with [facility] and surgery Friday - see CareEverywhere.   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   Past Medical History:  Diagnosis Date  . Asthma   . Depression   . External hemorrhoids   . Gastric ulcer   . Hydronephrosis   . Hypothyroidism    hypo  . Insomnia   . Mental disorder   . Myocardial infarction ([place])   . Paranoid schizophrenia ([place])   . Renal disorder    renal failure  . Seizures ([place])   . Stroke [facility])    Past Surgical History:  Procedure Laterality Date  . APPENDECTOMY    . COLONOSCOPY WITH PROPOFOL N/A 12/07/2014   Procedure: COLONOSCOPY WITH PROPOFOL;  Surgeon: Manya Silvas, MD;  Location: [facility] ENDOSCOPY;  Service: Endoscopy;  Laterality: N/A;  . ESOPHAGOGASTRODUODENOSCOPY N/A 12/07/2014   Procedure: ESOPHAGOGASTRODUODENOSCOPY (EGD);  Surgeon: Manya Silvas, MD;  Location: [facility] ENDOSCOPY;  Service: Endoscopy;  Laterality: N/A;  . NEPHRECTOMY    . SUPRAPUBIC CATHETER INSERTION      Home Medications:  Prescriptions Prior to Admission  Medication Sig Dispense Refill Last Dose  . acetaminophen (TYLENOL) 500 MG tablet Take 1,000 mg by mouth every 6 (six) hours as needed for mild pain, moderate pain or fever.   02/26/2016 at Unknown time  . albuterol-ipratropium (COMBIVENT) 18-103 MCG/ACT inhaler Inhale 1 puff into the lungs every 6 (six) hours as needed for wheezing or shortness of breath.    unknown  .  FLUoxetine (PROZAC) 20 MG capsule Take 60 mg by mouth every morning.   02/26/2016 at Unknown time  . hydrocortisone (ANUSOL-HC) 2.5 % rectal cream Place rectally 2 (two) times daily. (Patient taking differently: Place 1 application rectally 2 (two) times daily as needed. For hemorrhoids) 30 g 0 unknown  . levothyroxine (SYNTHROID, LEVOTHROID) 50 MCG tablet Take 50 mcg by mouth daily before breakfast.   02/26/2016 at Unknown time  . opium-belladonna (B&O SUPPRETTES) 16.2-60 MG suppository Place 1 suppository rectally every 6 (six) hours as needed for bladder spasms. 12 suppository 0 unknown  . risperiDONE (RISPERDAL) 0.5 MG tablet Take 0.5 mg by mouth at bedtime. Pt takes with a 3mg  tablet.   02/25/2016 at Unknown time  . risperiDONE (RISPERDAL) 3 MG tablet Take 3 mg by mouth at bedtime. Pt takes with a 0.5mg  tablet.   02/25/2016 at Unknown time  . sodium bicarbonate 650 MG tablet Take 650 mg by mouth 2 (two) times daily.    02/26/2016 at Unknown time  . sodium chloride 0.9 % infusion Inject 10 mLs into the vein 2 (two) times daily. *Infuse in catheter*   02/26/2016 at Unknown time  . solifenacin (VESICARE) 5 MG tablet Take 5 mg by mouth every morning.   02/26/2016 at Unknown time  . tamsulosin (FLOMAX) 0.4 MG CAPS capsule Take 0.4 mg by mouth every morning.    02/26/2016 at  Unknown time  . feeding supplement, ENSURE ENLIVE, (ENSURE ENLIVE) LIQD Take 237 mLs by mouth 2 (two) times daily with a meal. (Patient not taking: Reported on 07/13/2015) 237 mL 12   . levofloxacin (LEVAQUIN) 750 MG tablet Take 1 tablet (750 mg total) by mouth daily. 7 tablet 0   . oxyCODONE (ROXICODONE) 5 MG immediate release tablet Take 1-2 tablets (5-10 mg total) by mouth every 6 (six) hours as needed for severe pain. (Patient not taking: Reported on 02/26/2016) 15 tablet 0 Not Taking at Unknown time  . sulfamethoxazole-trimethoprim (BACTRIM,SEPTRA) 400-80 MG tablet Take 1 tablet by mouth 2 (two) times daily. 14 tablet 0 haven't started  yet   Allergies:  Allergies  Allergen Reactions  . Mellaril [Thioridazine] Shortness Of Breath and Swelling  . Buprenorphine Itching  . Morphine And Related Itching  . Navane [Thiothixene] Other (See Comments)    Reaction:  GI upset   . Thorazine [Chlorpromazine] Other (See Comments)    Reaction:  Dizziness/fainting     Family History  Problem Relation Age of Onset  . Stroke Father   . Pneumonia Father   . Brain cancer Mother    Social History:  reports that he has been smoking Cigarettes.  He has a 55.00 pack-year smoking history. He has quit using smokeless tobacco. His smokeless tobacco use included Chew. He reports that he does not drink alcohol or use drugs.  ROS: A complete review of systems was performed.  All systems are negative except for pertinent findings as noted. ROS   Physical Exam:  Vital signs in last 24 hours: Temp:  [97.7 F (36.5 C)-98.2 F (36.8 C)] 97.8 F (36.6 C) (09/27 0553) Pulse Rate:  [59-81] 64 (09/27 0553) Resp:  [16-19] 19 (09/27 0553) BP: (124-156)/(85-99) 156/96 (09/27 0553) SpO2:  [96 %-100 %] 100 % (09/27 0553) Weight:  [69.5 kg (153 lb 4.8 oz)-70.8 kg (156 lb)] 69.5 kg (153 lb 4.8 oz) (09/26 1748)    Intake/Output Summary (Last 24 hours) at 02/27/16 0842 Last data filed at 02/27/16 8185  Gross per 24 hour  Intake             2444 ml  Output             4695 ml  Net            -2251 ml    General:  Alert and oriented, No acute distress HEENT: Normocephalic, atraumatic Neck: No JVD or lymphadenopathy Cardiovascular: Regular rate and rhythm Lungs: Regular rate and effort Abdomen: Soft, nontender, nondistended, no abdominal masses; SP tube in place - no drainage. Normal granulating tract. Urine clear.  Extremities: No edema Neurologic: Grossly intact  Laboratory Data:  Results for orders placed or performed during the hospital encounter of 02/26/16 (from the past 24 hour(s))  Basic metabolic panel     Status: Abnormal    Collection Time: 02/26/16  4:00 PM  Result Value Ref Range   Sodium 136 135 - 145 mmol/L   Potassium 4.0 3.5 - 5.1 mmol/L   Chloride 107 101 - 111 mmol/L   CO2 22 22 - 32 mmol/L   Glucose, Bld 96 65 - 99 mg/dL   BUN 31 (H) 6 - 20 mg/dL   Creatinine, Ser 2.16 (H) 0.61 - 1.24 mg/dL   Calcium 9.0 8.9 - 10.3 mg/dL   GFR calc non Af Amer 31 (L) >60 mL/min   GFR calc Af Amer 36 (L) >60 mL/min   Anion gap 7 5 -  15  CBC     Status: Abnormal   Collection Time: 02/26/16  4:41 PM  Result Value Ref Range   WBC 11.8 (H) 3.8 - 10.6 K/uL   RBC 4.72 4.40 - 5.90 MIL/uL   Hemoglobin 14.4 13.0 - 18.0 g/dL   HCT 43.6 40.0 - 52.0 %   MCV 92.4 80.0 - 100.0 fL   MCH 30.5 26.0 - 34.0 pg   MCHC 33.0 32.0 - 36.0 g/dL   RDW 16.0 (H) 11.5 - 14.5 %   Platelets 148 (L) 150 - 440 K/uL  Differential     Status: Abnormal   Collection Time: 02/26/16  4:41 PM  Result Value Ref Range   Neutrophils Relative % 68 %   Neutro Abs 8.1 (H) 1.4 - 6.5 K/uL   Lymphocytes Relative 18 %   Lymphs Abs 2.1 1.0 - 3.6 K/uL   Monocytes Relative 9 %   Monocytes Absolute 1.0 0.2 - 1.0 K/uL   Eosinophils Relative 5 %   Eosinophils Absolute 0.6 0 - 0.7 K/uL   Basophils Relative 0 %   Basophils Absolute 0.0 0 - 0.1 K/uL  MRSA PCR Screening     Status: None   Collection Time: 02/26/16  9:37 PM  Result Value Ref Range   MRSA by PCR NEGATIVE NEGATIVE  Basic metabolic panel     Status: Abnormal   Collection Time: 02/27/16  4:57 AM  Result Value Ref Range   Sodium 138 135 - 145 mmol/L   Potassium 4.4 3.5 - 5.1 mmol/L   Chloride 108 101 - 111 mmol/L   CO2 21 (L) 22 - 32 mmol/L   Glucose, Bld 97 65 - 99 mg/dL   BUN 29 (H) 6 - 20 mg/dL   Creatinine, Ser 1.99 (H) 0.61 - 1.24 mg/dL   Calcium 8.8 (L) 8.9 - 10.3 mg/dL   GFR calc non Af Amer 34 (L) >60 mL/min   GFR calc Af Amer 40 (L) >60 mL/min   Anion gap 9 5 - 15  CBC     Status: Abnormal   Collection Time: 02/27/16  4:57 AM  Result Value Ref Range   WBC 9.0 3.8 - 10.6 K/uL    RBC 4.30 (L) 4.40 - 5.90 MIL/uL   Hemoglobin 13.7 13.0 - 18.0 g/dL   HCT 39.7 (L) 40.0 - 52.0 %   MCV 92.5 80.0 - 100.0 fL   MCH 31.9 26.0 - 34.0 pg   MCHC 34.6 32.0 - 36.0 g/dL   RDW 16.5 (H) 11.5 - 14.5 %   Platelets 123 (L) 150 - 440 K/uL   Recent Results (from the past 240 hour(s))  Wound or Superficial Culture     Status: None (Preliminary result)   Collection Time: 02/25/16  7:55 PM  Result Value Ref Range Status   Specimen Description STOMA  Final   Special Requests NONE  Final   Gram Stain   Final    MODERATE WBC PRESENT,BOTH PMN AND MONONUCLEAR FEW GRAM POSITIVE COCCI IN PAIRS FEW GRAM POSITIVE RODS Performed at [facility]    Culture PENDING  Incomplete   Report Status PENDING  Incomplete  MRSA PCR Screening     Status: None   Collection Time: 02/26/16  9:37 PM  Result Value Ref Range Status   MRSA by PCR NEGATIVE NEGATIVE Final    Comment:        The GeneXpert MRSA Assay (FDA approved for NASAL specimens only), is one component of a comprehensive MRSA  colonization surveillance program. It is not intended to diagnose MRSA infection nor to guide or monitor treatment for MRSA infections.    Creatinine:  Recent Labs  02/25/16 1955 02/26/16 1600 02/27/16 0457  CREATININE 2.31* 2.16* 1.99*    Impression/Assessment:  abd pain, leukocytosis, urethral sx -   Plan:  -cultures are pending -His SP tube is functioning normally and his UOP is excellent. His Cr is at / near his baseline. The SP tube site looks normal. There is always going to be some drainage as the tract matures. There is no significant drainage today. His abd exam is normal. I don't think his SP tube needs to be changed, he has a pre-op appt tomorrow at [place] and surgery scheduled for Friday). However, if his surgery is delayed, he'll likely need an SP tube change at some point and it would be worthwhile to do it while he's here on abx.   I'll sign off. If he is d/c'd today or in AM,  no need to change SP tube. If he's here through Friday have IR change SP tube.   Gurman Ashland 02/27/2016, 8:35 AM

## 2016-02-27 NOTE — Consult Note (Signed)
[facility] Infectious Disease     Reason for Consult: UTI    Referring Physician: Valentino Nose Date of Admission:  02/26/2016   Active Problems:   UTI (lower urinary tract infection)   HPI: Alan Small is a 60 y.o. male admitted with abd pain and drainage from [facility] cath which was placed a month prior at [facility] for urethral stricture.  He had otpt UCX done with pseudomonas noted. On admit wbc 14, no fevers. UA 6-30 wbc.  He was started on IV cefepime and has seen urology. He was scheduled for surgery this Friday at [facility]. He lives in a family home.   Past Medical History:  Diagnosis Date  . Asthma   . Depression   . External hemorrhoids   . Gastric ulcer   . Hydronephrosis   . Hypothyroidism    hypo  . Insomnia   . Mental disorder   . Myocardial infarction ([place])   . Paranoid schizophrenia ([place])   . Renal disorder    renal failure  . Seizures ([place])   . Stroke [facility])    Past Surgical History:  Procedure Laterality Date  . APPENDECTOMY    . COLONOSCOPY WITH PROPOFOL N/A 12/07/2014   Procedure: COLONOSCOPY WITH PROPOFOL;  Surgeon: Manya Silvas, MD;  Location: [facility] ENDOSCOPY;  Service: Endoscopy;  Laterality: N/A;  . ESOPHAGOGASTRODUODENOSCOPY N/A 12/07/2014   Procedure: ESOPHAGOGASTRODUODENOSCOPY (EGD);  Surgeon: Manya Silvas, MD;  Location: [facility] ENDOSCOPY;  Service: Endoscopy;  Laterality: N/A;  . NEPHRECTOMY    . SUPRAPUBIC CATHETER INSERTION     Social History  Substance Use Topics  . Smoking status: Current Every Day Smoker    Packs/day: 1.00    Years: 55.00    Types: Cigarettes  . Smokeless tobacco: Former Systems developer    Types: Chew  . Alcohol use No   Family History  Problem Relation Age of Onset  . Stroke Father   . Pneumonia Father   . Brain cancer Mother     Allergies:  Allergies  Allergen Reactions  . Mellaril [Thioridazine] Shortness Of Breath and Swelling  . Buprenorphine Itching  . Morphine And Related Itching  . Navane [Thiothixene] Other (See  Comments)    Reaction:  GI upset   . Thorazine [Chlorpromazine] Other (See Comments)    Reaction:  Dizziness/fainting     Current antibiotics: Antibiotics Given (last 72 hours)    Date/Time Action Medication Dose Rate   02/26/16 2123 Given   ceFEPIme (MAXIPIME) 1 g in dextrose 5 % 50 mL IVPB 1 g 100 mL/hr      MEDICATIONS: . ceFEPime (MAXIPIME) IV  2 g Intravenous Q24H  . darifenacin  7.5 mg Oral Daily  . enoxaparin (LOVENOX) injection  40 mg Subcutaneous Q24H  . FLUoxetine  60 mg Oral BH-q7a  . levothyroxine  50 mcg Oral QAC breakfast  . nicotine  21 mg Transdermal Daily  . risperiDONE  0.5 mg Oral QHS  . risperiDONE  3 mg Oral QHS  . sodium bicarbonate  650 mg Oral BID  . tamsulosin  0.4 mg Oral BH-q7a    Review of Systems - 11 systems reviewed and negative per HPI   OBJECTIVE: Temp:  [97.7 F (36.5 C)-98.3 F (36.8 C)] 98.3 F (36.8 C) (09/27 1245) Pulse Rate:  [59-72] 72 (09/27 1245) Resp:  [16-19] 18 (09/27 1245) BP: (124-156)/(85-97) 156/97 (09/27 1245) SpO2:  [96 %-100 %] 97 % (09/27 1245) Weight:  [69.5 kg (153 lb 4.8 oz)] 69.5 kg (153  lb 4.8 oz) (09/26 1748) Physical Exam  Constitutional: He is oriented to person, place, and time. He appears well-developed and well-nourished. No distress.  HENT:  Mouth/Throat: Oropharynx is clear and moist. No oropharyngeal exudate.  Cardiovascular: Normal rate, regular rhythm and normal heart sounds. Exam reveals no gallop and no friction rub.  No murmur heard.  Pulmonary/Chest: Effort normal and breath sounds normal. No respiratory distress. He has no wheezes.  Abdominal: Soft. SP cath in place. Some mild erytehma and ttp at insertion site but no purulence Lymphadenopathy:  He has no cervical adenopathy.  Neurological: He is alert and oriented to person, place, and time.  Skin: erythema over abd - fungal appearing Psychiatric: He has a normal mood and affect. His behavior is normal.     LABS: Results for orders  placed or performed during the hospital encounter of 02/26/16 (from the past 48 hour(s))  Basic metabolic panel     Status: Abnormal   Collection Time: 02/26/16  4:00 PM  Result Value Ref Range   Sodium 136 135 - 145 mmol/L   Potassium 4.0 3.5 - 5.1 mmol/L   Chloride 107 101 - 111 mmol/L   CO2 22 22 - 32 mmol/L   Glucose, Bld 96 65 - 99 mg/dL   BUN 31 (H) 6 - 20 mg/dL   Creatinine, Ser 2.16 (H) 0.61 - 1.24 mg/dL   Calcium 9.0 8.9 - 10.3 mg/dL   GFR calc non Af Amer 31 (L) >60 mL/min   GFR calc Af Amer 36 (L) >60 mL/min    Comment: (NOTE) The eGFR has been calculated using the CKD EPI equation. This calculation has not been validated in all clinical situations. eGFR's persistently <60 mL/min signify possible Chronic Kidney Disease.    Anion gap 7 5 - 15  CBC     Status: Abnormal   Collection Time: 02/26/16  4:41 PM  Result Value Ref Range   WBC 11.8 (H) 3.8 - 10.6 K/uL   RBC 4.72 4.40 - 5.90 MIL/uL   Hemoglobin 14.4 13.0 - 18.0 g/dL   HCT 43.6 40.0 - 52.0 %   MCV 92.4 80.0 - 100.0 fL   MCH 30.5 26.0 - 34.0 pg   MCHC 33.0 32.0 - 36.0 g/dL   RDW 16.0 (H) 11.5 - 14.5 %   Platelets 148 (L) 150 - 440 K/uL  Differential     Status: Abnormal   Collection Time: 02/26/16  4:41 PM  Result Value Ref Range   Neutrophils Relative % 68 %   Neutro Abs 8.1 (H) 1.4 - 6.5 K/uL   Lymphocytes Relative 18 %   Lymphs Abs 2.1 1.0 - 3.6 K/uL   Monocytes Relative 9 %   Monocytes Absolute 1.0 0.2 - 1.0 K/uL   Eosinophils Relative 5 %   Eosinophils Absolute 0.6 0 - 0.7 K/uL   Basophils Relative 0 %   Basophils Absolute 0.0 0 - 0.1 K/uL  MRSA PCR Screening     Status: None   Collection Time: 02/26/16  9:37 PM  Result Value Ref Range   MRSA by PCR NEGATIVE NEGATIVE    Comment:        The GeneXpert MRSA Assay (FDA approved for NASAL specimens only), is one component of a comprehensive MRSA colonization surveillance program. It is not intended to diagnose MRSA infection nor to guide  or monitor treatment for MRSA infections.   Basic metabolic panel     Status: Abnormal   Collection Time: 02/27/16  4:57 AM  Result Value Ref Range   Sodium 138 135 - 145 mmol/L   Potassium 4.4 3.5 - 5.1 mmol/L   Chloride 108 101 - 111 mmol/L   CO2 21 (L) 22 - 32 mmol/L   Glucose, Bld 97 65 - 99 mg/dL   BUN 29 (H) 6 - 20 mg/dL   Creatinine, Ser 1.99 (H) 0.61 - 1.24 mg/dL   Calcium 8.8 (L) 8.9 - 10.3 mg/dL   GFR calc non Af Amer 34 (L) >60 mL/min   GFR calc Af Amer 40 (L) >60 mL/min    Comment: (NOTE) The eGFR has been calculated using the CKD EPI equation. This calculation has not been validated in all clinical situations. eGFR's persistently <60 mL/min signify possible Chronic Kidney Disease.    Anion gap 9 5 - 15  CBC     Status: Abnormal   Collection Time: 02/27/16  4:57 AM  Result Value Ref Range   WBC 9.0 3.8 - 10.6 K/uL   RBC 4.30 (L) 4.40 - 5.90 MIL/uL   Hemoglobin 13.7 13.0 - 18.0 g/dL   HCT 39.7 (L) 40.0 - 52.0 %   MCV 92.5 80.0 - 100.0 fL   MCH 31.9 26.0 - 34.0 pg   MCHC 34.6 32.0 - 36.0 g/dL   RDW 16.5 (H) 11.5 - 14.5 %   Platelets 123 (L) 150 - 440 K/uL   No components found for: ESR, C REACTIVE PROTEIN MICRO: Recent Results (from the past 720 hour(s))  Urine culture     Status: Abnormal   Collection Time: 02/02/16  8:54 PM  Result Value Ref Range Status   Specimen Description URINE, SUPRAPUBIC  Final   Special Requests Normal  Final   Culture MULTIPLE SPECIES PRESENT, SUGGEST RECOLLECTION (A)  Final   Report Status 02/04/2016 FINAL  Final  Blood culture (routine x 2)     Status: None   Collection Time: 02/02/16  8:54 PM  Result Value Ref Range Status   Specimen Description BLOOD LEFT ASSIST CONTROL  Final   Special Requests BOTTLES DRAWN AEROBIC AND ANAEROBIC 6CC  Final   Culture NO GROWTH 5 DAYS  Final   Report Status 02/07/2016 FINAL  Final  Blood culture (routine x 2)     Status: Abnormal   Collection Time: 02/02/16  8:54 PM  Result Value Ref  Range Status   Specimen Description BLOOD RIGHT ASSIST CONTROL  Final   Special Requests BOTTLES DRAWN AEROBIC AND ANAEROBIC 5CCAERO,8CCANA  Final   Culture  Setup Time   Final    Organism ID to follow [place] CRITICAL RESULT CALLED TO, READ BACK BY AND VERIFIED WITH: SHEEMA HALLAJI 02/03/16 @ 1917  MLK    Culture (A)  Final    STAPHYLOCOCCUS SPECIES (COAGULASE NEGATIVE) THE SIGNIFICANCE OF ISOLATING THIS ORGANISM FROM A SINGLE SET OF BLOOD CULTURES WHEN MULTIPLE SETS ARE DRAWN IS UNCERTAIN. PLEASE NOTIFY THE MICROBIOLOGY DEPARTMENT WITHIN ONE WEEK IF SPECIATION AND SENSITIVITIES ARE REQUIRED. Performed at [facility]    Report Status 02/05/2016 FINAL  Final  Blood Culture ID Panel (Reflexed)     Status: Abnormal   Collection Time: 02/02/16  8:54 PM  Result Value Ref Range Status   Enterococcus species NOT DETECTED NOT DETECTED Final   Listeria monocytogenes NOT DETECTED NOT DETECTED Final   Staphylococcus species DETECTED (A) NOT DETECTED Final    Comment: CRITICAL RESULT CALLED TO, READ BACK BY AND VERIFIED WITH: SHEEMA HALLAJI 02/03/16 @ [place]  Staphylococcus aureus NOT DETECTED NOT DETECTED Final   Methicillin resistance DETECTED (A) NOT DETECTED Final    Comment: CRITICAL RESULT CALLED TO, READ BACK BY AND VERIFIED WITH: SHEEMA HALLAJI 02/03/16 @ 1917  MLK    Streptococcus species NOT DETECTED NOT DETECTED Final   Streptococcus agalactiae NOT DETECTED NOT DETECTED Final   Streptococcus pneumoniae NOT DETECTED NOT DETECTED Final   Streptococcus pyogenes NOT DETECTED NOT DETECTED Final   Acinetobacter baumannii NOT DETECTED NOT DETECTED Final   Enterobacteriaceae species NOT DETECTED NOT DETECTED Final   Enterobacter cloacae complex NOT DETECTED NOT DETECTED Final   Escherichia coli NOT DETECTED NOT DETECTED Final   Klebsiella oxytoca NOT DETECTED NOT DETECTED Final   Klebsiella pneumoniae NOT DETECTED NOT DETECTED Final   Proteus  species NOT DETECTED NOT DETECTED Final   Serratia marcescens NOT DETECTED NOT DETECTED Final   Haemophilus influenzae NOT DETECTED NOT DETECTED Final   Neisseria meningitidis NOT DETECTED NOT DETECTED Final   Pseudomonas aeruginosa NOT DETECTED NOT DETECTED Final   Candida albicans NOT DETECTED NOT DETECTED Final   Candida glabrata NOT DETECTED NOT DETECTED Final   Candida krusei NOT DETECTED NOT DETECTED Final   Candida parapsilosis NOT DETECTED NOT DETECTED Final   Candida tropicalis NOT DETECTED NOT DETECTED Final  Wound or Superficial Culture     Status: None (Preliminary result)   Collection Time: 02/25/16  7:55 PM  Result Value Ref Range Status   Specimen Description STOMA  Final   Special Requests NONE  Final   Gram Stain   Final    MODERATE WBC PRESENT,BOTH PMN AND MONONUCLEAR FEW GRAM POSITIVE COCCI IN PAIRS FEW GRAM POSITIVE RODS    Culture   Final    CULTURE REINCUBATED FOR BETTER GROWTH Performed at [facility]    Report Status PENDING  Incomplete  MRSA PCR Screening     Status: None   Collection Time: 02/26/16  9:37 PM  Result Value Ref Range Status   MRSA by PCR NEGATIVE NEGATIVE Final    Comment:        The GeneXpert MRSA Assay (FDA approved for NASAL specimens only), is one component of a comprehensive MRSA colonization surveillance program. It is not intended to diagnose MRSA infection nor to guide or monitor treatment for MRSA infections.     IMAGING: 02/03/16 FINDINGS: Evaluation of the solid organs and vasculature is limited in the absence of intravenous contrast.  LOWER CHEST: Bibasilar atelectasis. Consolidative opacities are also present in the right lower lung.  ABDOMEN/PELVIS:  HEPATOBILIARY: Hypodensities are unchanged and likely represent simple cysts. Large simple cyst arising from the left lobe is unchanged. No biliary ductal dilatation.  GALLBLADDER: Unremarkable. PANCREAS: Unremarkable. SPLEEN: Unremarkable. ADRENAL GLANDS:  Unremarkable.  KIDNEYS/URETERS: Right kidney is surgically absent. Increasing parenchymal calcifications in the left kidney. No hydronephrosis or obstructing calculus. BLADDER: Suprapubic catheter in place, with mild wall thickening of the bladder dome and pericystic stranding.  BOWEL/PERITONEUM/RETROPERITONEUM:Large and small bowel are unremarkable. No acute inflammatory process. No free fluid.  REPRODUCTIVE ORGANS: Prostatic calcifications are present.  VASCULATURE: Abdominal aorta within normal limits. Aortic and branch vessel calcifications. Evaluation limited by lack of IV contrast.    LYMPH NODES: No adenopathy by size criteria. Calcified lymph node is unchanged.  BONES/SOFT TISSUES: Disc bulge at L5-S1. No acute osseous abnormality. Calcified granulomas in the buttocks unchanged. Assessment:   HERBERTH DEHARO is a 60 y.o. male with Pseudomonas UTI Resistant to quinolones with a SP cath placed a month  ago with planned repair of urethral stricture and removal of the SP cath at [facility].   Given quinolone resistance, indwelling SP cath and need for surgery will need IV abx to clear this infection. Fosfomycin could be an option but cannot test sensitivity for this and may not clear the infection with hardware in place   Recommendations Place PICC Will give 2 week course of cefepime 2 gm q 24 for 2 weeks Would suggest surgery be done towards the end of abx course while he is still on ABx as there is a high risk infection will recur with cath in place.  Thank you very much for allowing me to participate in the care of this patient. Please call with questions.   Cheral Marker. Ola Spurr, MD

## 2016-02-27 NOTE — Care Management Important Message (Signed)
Important Message  Patient Details  Name: Alan Small MRN: [id number] Date of Birth: [birth year]   Medicare Important Message Given:  Yes    Shelbie Ammons, RN 02/27/2016, 7:49 AM

## 2016-02-27 NOTE — Progress Notes (Signed)
CH responded to consult for 102A. Pt was requesting prayer in praise for his sisters new home, and concern for his two friends who "needed to be led in the right direction". He also desired prayer about his upcoming surgery. I provided prayer and the ministry of empathetic listening. He also requested a Bible which I was able to provide. [facility] services are available for follow up as needed.    02/27/16 1000  Clinical Encounter Type  Visited With Patient;Health care provider  Visit Type Initial  Referral From Nurse  Spiritual Encounters  Spiritual Needs Literature;Prayer  Stress Factors  Patient Stress Factors Health changes

## 2016-02-27 NOTE — Clinical Social Work Note (Signed)
Clinical Social Work Assessment  Patient Details  Name: Alan Alan MRN: [id number] Date of Birth: 1956-05-24  Date of referral:  02/27/16               Reason for consult:  Discharge Planning                Permission sought to share information with:  Alan Alan) Permission granted to share information::     Name::        Agency::     Relationship::     Contact Information:     Housing/Transportation Living arrangements for the past 2 months:  [facility] ([place]) Source of Information:  Alan Alan) Patient Interpreter Needed:  None Criminal Activity/Legal Involvement Pertinent to Current Situation/Hospitalization:  No - Comment as needed Significant Relationships:  Other Family Members, Siblings Lives with:  Facility Resident ([place]) Do you feel safe going back to the place where you live?  Yes Need for family participation in patient care:  Yes (Comment) Alan Alan)   Care giving concerns:  Patient is from [place] [facility] and his Legal Alan is APS Alan Alan)    Social Worker assessment / plan:  CSW spoke to [employer] over the phone. Per Alan Alan patient is from [place] [facility]. She reports that patient will return when he is discharged unless higher level of care is needed. Grated CSW verbal permission to coordinate discharge with [name]. Stated that she would need patient's discharge summary and FL2 faxed to her at [phone]. FL2/ PASRR will be completed at discharge due to [place] having to manually input discharge medications onto form. CSW will continue to follow and assist.    Employment status:  Retired Forensic scientist:  Medicare PT Recommendations:  Not assessed at this time Information / Referral to community resources:     Patient/Family's Response to care:   Alan Alan in agreement for patient to return to [facility] at discharge  Patient/Family's Understanding of and Emotional Response to Diagnosis, Current Treatment, and Prognosis:  Alan Alan reports she understood and thanked CSW for assistance.   Emotional Assessment Appearance:  Appears stated age Attitude/Demeanor/Rapport:  Unable to Assess Affect (typically observed):  Unable to Assess Orientation:   (Unable to Assess) Alcohol / Substance use:  Not Applicable Psych involvement (Current and /or in the community):  No (Comment)  Discharge Needs  Concerns to be addressed:  Discharge Planning Concerns Readmission within the last 30 days:  No Current discharge risk:  Chronically ill Barriers to Discharge:  Continued Medical Work up   [employer], [place] 02/27/2016, 3:01 PM

## 2016-02-27 NOTE — Progress Notes (Signed)
Infectious Disease Long Term IV Antibiotic Orders  Diagnosis: Complicated UTI  Culture results Resistant Pseudomonas  Allergies:  Allergies  Allergen Reactions  . Mellaril [Thioridazine] Shortness Of Breath and Swelling  . Buprenorphine Itching  . Morphine And Related Itching  . Navane [Thiothixene] Other (See Comments)    Reaction:  GI upset   . Thorazine [Chlorpromazine] Other (See Comments)    Reaction:  Dizziness/fainting     Discharge antibiotics Cefepime 2 gm IV q 24   PICC Care per protocol Labs weekly while on IV antibiotics      CBC w diff   Comprehensive met panel  Planned duration of antibiotics 2 weeks from 9/26  Stop date 03/11/16  Follow up clinic date No need for ID fu with me- needs to see urology at [facility] prior to stopping IV Abx FAX weekly labs to [phone]  Leonel Ramsay, MD

## 2016-02-27 NOTE — Progress Notes (Signed)
Alan Small NAME: Alan Small    MRN#:  [id number]  DATE OF BIRTH:  04/26/1956  SUBJECTIVE:  Hospital Day: 1 day Alan Small is a 60 y.o. male presenting with abdominal pain .   Overnight events: no acute overnight events Interval Events: states abdominal pain improved  REVIEW OF SYSTEMS:  CONSTITUTIONAL: No fever, fatigue or weakness.  EYES: No blurred or double vision.  EARS, NOSE, AND THROAT: No tinnitus or ear pain.  RESPIRATORY: No cough, shortness of breath, wheezing or hemoptysis.  CARDIOVASCULAR: No chest pain, orthopnea, edema.  GASTROINTESTINAL: No nausea, vomiting, diarrhea or abdominal pain.  GENITOURINARY: No dysuria, hematuria.  ENDOCRINE: No polyuria, nocturia,  HEMATOLOGY: No anemia, easy bruising or bleeding SKIN: No rash or lesion. MUSCULOSKELETAL: No joint pain or arthritis.   NEUROLOGIC: No tingling, numbness, weakness.  PSYCHIATRY: No anxiety or depression.   DRUG ALLERGIES:   Allergies  Allergen Reactions  . Mellaril [Thioridazine] Shortness Of Breath and Swelling  . Buprenorphine Itching  . Morphine And Related Itching  . Navane [Thiothixene] Other (See Comments)    Reaction:  GI upset   . Thorazine [Chlorpromazine] Other (See Comments)    Reaction:  Dizziness/fainting     VITALS:  Blood pressure (!) 156/96, pulse 64, temperature 97.8 F (36.6 C), resp. rate 19, height 6' (1.829 m), weight 69.5 kg (153 lb 4.8 oz), SpO2 100 %.  PHYSICAL EXAMINATION:  VITAL SIGNS: Vitals:   02/26/16 2118 02/27/16 0553  BP: 127/85 (!) 156/96  Pulse: 62 64  Resp: 18 19  Temp: 97.7 F (36.5 C) 97.8 F (36.6 C)   GENERAL:60 y.o.male currently in no acute distress.  HEAD: Normocephalic, atraumatic.  EYES: Pupils equal, round, reactive to light. Extraocular muscles intact. No scleral icterus.  MOUTH: Moist mucosal membrane. Dentition intact. No abscess noted.  EAR, NOSE, THROAT: Clear without  exudates. No external lesions.  NECK: Supple. No thyromegaly. No nodules. No JVD.  PULMONARY: Clear to ascultation, without wheeze rails or rhonci. No use of accessory muscles, Good respiratory effort. good air entry bilaterally CHEST: Nontender to palpation.  CARDIOVASCULAR: S1 and S2. Regular rate and rhythm. No murmurs, rubs, or gallops. No edema. Pedal pulses 2+ bilaterally.  GASTROINTESTINAL: Soft, nontender, nondistended. No masses. Positive bowel sounds. No hepatosplenomegaly.  MUSCULOSKELETAL: No swelling, clubbing, or edema. Range of motion full in all extremities.  NEUROLOGIC: Cranial nerves II through XII are intact. No gross focal neurological deficits. Sensation intact. Reflexes intact.  SKIN: SPC in place without erythema, No ulceration, lesions, rashes, or cyanosis. Skin warm and dry. Turgor intact.  PSYCHIATRIC: Mood, affect within normal limits. The patient is awake, alert and oriented x 3. Insight, judgment intact.      LABORATORY PANEL:   CBC  Recent Labs Lab 02/27/16 0457  WBC 9.0  HGB 13.7  HCT 39.7*  PLT 123*   ------------------------------------------------------------------------------------------------------------------  Chemistries   Recent Labs Lab 02/27/16 0457  NA 138  K 4.4  CL 108  CO2 21*  GLUCOSE 97  BUN 29*  CREATININE 1.99*  CALCIUM 8.8*   ------------------------------------------------------------------------------------------------------------------  Cardiac Enzymes No results for input(s): TROPONINI in the last 168 hours. ------------------------------------------------------------------------------------------------------------------  RADIOLOGY:  No results found.  EKG:   Orders placed or performed during the hospital encounter of 10/17/15  . ED EKG  . ED EKG  . EKG 12-Lead  . EKG 12-Lead  . EKG    ASSESSMENT AND PLAN:   Alan Small is a  60 y.o. male presenting with Dehydration . Admitted 02/26/2016 : Day #: 1  day  1. Uti: complicated by suprapubic catheter: pain improved, discussed with urology this morning - input appreciated. Continue current antibiotics, ID recs pending, follow cultures  2. Hypothyroid unspecified: synthroid 3. Paranoid schizophrenia: risperdal   All the records are reviewed and case discussed with Care Management/Social Workerr. Management plans discussed with the patient, family and they are in agreement.  CODE STATUS: full TOTAL TIME TAKING CARE OF THIS PATIENT: 28 minutes.   POSSIBLE D/C IN 1-2DAYS, DEPENDING ON CLINICAL CONDITION.   Alan Small,  Karenann Cai.D on 02/27/2016 at 12:15 PM  Between 7am to 6pm - Pager - [phone]  After 6pm: House Pager: - [facility]  Office  [phone]  CC: Primary care physician; [name], MD

## 2016-02-28 LAB — CBC
HCT: 38.7 % — ABNORMAL LOW (ref 40.0–52.0)
Hemoglobin: 13.4 g/dL (ref 13.0–18.0)
MCH: 31.5 pg (ref 26.0–34.0)
MCHC: 34.7 g/dL (ref 32.0–36.0)
MCV: 90.8 fL (ref 80.0–100.0)
PLATELETS: 115 10*3/uL — AB (ref 150–440)
RBC: 4.26 MIL/uL — AB (ref 4.40–5.90)
RDW: 16.1 % — AB (ref 11.5–14.5)
WBC: 7.7 10*3/uL (ref 3.8–10.6)

## 2016-02-28 MED ORDER — DOCUSATE SODIUM 100 MG PO CAPS
100.0000 mg | ORAL_CAPSULE | Freq: Two times a day (BID) | ORAL | Status: DC
  Administered 2016-02-28: 09:00:00 100 mg via ORAL
  Filled 2016-02-28: qty 1

## 2016-02-28 MED ORDER — DEXTROSE 5 % IV SOLN: 2.0000 g | INTRAVENOUS | Status: AC

## 2016-02-28 MED ORDER — POLYETHYLENE GLYCOL 3350 17 G PO PACK: 17.0000 g | PACK | Freq: Every day | ORAL | Status: DC | PRN

## 2016-02-28 NOTE — Discharge Summary (Signed)
[facility] NAME: Alan Small    MR#:  [id number]  [place]:  02-25-1956  DATE OF ADMISSION:  02/26/2016 ADMITTING PHYSICIAN: Henreitta Leber, MD  DATE OF DISCHARGE: 02/28/2016  PRIMARY CARE PHYSICIAN: [name], MD    ADMISSION DIAGNOSIS:  UTI (lower urinary tract infection) [N39.0] Abdominal wall cellulitis [T51.761]  DISCHARGE DIAGNOSIS:  Active Problems:   UTI (lower urinary tract infection)   SECONDARY DIAGNOSIS:   Past Medical History:  Diagnosis Date  . Asthma   . Depression   . External hemorrhoids   . Gastric ulcer   . Hydronephrosis   . Hypothyroidism    hypo  . Insomnia   . Mental disorder   . Myocardial infarction ([place])   . Paranoid schizophrenia ([place])   . Renal disorder    renal failure  . Seizures ([place])   . Stroke [facility])     HOSPITAL COURSE:   60 year old male with past medical history of paranoid schizophrenia, BPH, previous CVA, history of seizures, previous history of MI, hypothyroidism, depression who presented to the hospital due to abdominal pain and drainage near his suprapubic catheter site.  1. Urinary tract infection-patient's urine culture as an outpatient was positive for multidrug resistant Pseudomonas. -Appreciate infectious disease consult. Patient will need 2 weeks of IV antibiotics with cefepime. -PICC line has been placed and continue cefepime for now.  2. History of urethral stricture status post superior catheter-patient had this done earlier this month. He has noted some drainage around his suprapubic insertion site. - I did not notice any drainage near his supra-pubic cath site.  - appreciate Urology input and no plans for Supra-pubic cath change at this point.  This should be done later while he is on IV abx.  Pt. To be transferred to [facility] for Urethroplasy. -Continue Flomax.  3. Leukocytosis-secondary to the urinary tract infection - improved w/  IV abx.    4. Hypothyroidism-continue Synthroid.  5. History of paranoid schizophrenia-continue Risperdal.  6. Depression-continue Prozac.  Pt. Is being transferred to [facility] for need for Urethroplasy.    DISCHARGE CONDITIONS:   Stable  CONSULTS OBTAINED:  Treatment Team:  Festus Aloe, MD Leonel Ramsay, MD  DRUG ALLERGIES:   Allergies  Allergen Reactions  . Mellaril [Thioridazine] Shortness Of Breath and Swelling  . Buprenorphine Itching  . Morphine And Related Itching  . Navane [Thiothixene] Other (See Comments)    Reaction:  GI upset   . Thorazine [Chlorpromazine] Other (See Comments)    Reaction:  Dizziness/fainting     DISCHARGE MEDICATIONS:     Medication List    STOP taking these medications   feeding supplement (ENSURE ENLIVE) Liqd   levofloxacin 750 MG tablet Commonly known as:  LEVAQUIN   oxyCODONE 5 MG immediate release tablet Commonly known as:  ROXICODONE   sulfamethoxazole-trimethoprim 400-80 MG tablet Commonly known as:  BACTRIM,SEPTRA     TAKE these medications   acetaminophen 500 MG tablet Commonly known as:  TYLENOL Take 1,000 mg by mouth every 6 (six) hours as needed for mild pain, moderate pain or fever.   ceFEPIme 2 g in dextrose 5 % 50 mL Inject 2 g into the vein daily.   COMBIVENT 18-103 MCG/ACT inhaler Generic drug:  albuterol-ipratropium Inhale 1 puff into the lungs every 6 (six) hours as needed for wheezing or shortness of breath.   FLUoxetine 20 MG capsule Commonly known as:  PROZAC Take 60 mg  by mouth every morning.   hydrocortisone 2.5 % rectal cream Commonly known as:  ANUSOL-HC Place rectally 2 (two) times daily. What changed:  how much to take  when to take this  reasons to take this  additional instructions   levothyroxine 50 MCG tablet Commonly known as:  SYNTHROID, LEVOTHROID Take 50 mcg by mouth daily before breakfast.   opium-belladonna 16.2-60 MG suppository Commonly known  as:  B&O SUPPRETTES Place 1 suppository rectally every 6 (six) hours as needed for bladder spasms.   risperiDONE 0.5 MG tablet Commonly known as:  RISPERDAL Take 0.5 mg by mouth at bedtime. Pt takes with a 3mg  tablet.   risperiDONE 3 MG tablet Commonly known as:  RISPERDAL Take 3 mg by mouth at bedtime. Pt takes with a 0.5mg  tablet.   sodium bicarbonate 650 MG tablet Take 650 mg by mouth 2 (two) times daily.   sodium chloride 0.9 % infusion Inject 10 mLs into the vein 2 (two) times daily. *Infuse in catheter*   solifenacin 5 MG tablet Commonly known as:  VESICARE Take 5 mg by mouth every morning.   tamsulosin 0.4 MG Caps capsule Commonly known as:  FLOMAX Take 0.4 mg by mouth every morning.         DISCHARGE INSTRUCTIONS:   DIET:  Regular diet  DISCHARGE CONDITION:  Stable  ACTIVITY:  Activity as tolerated  OXYGEN:  Home Oxygen: No.   Oxygen Delivery: room air  DISCHARGE LOCATION:  [employer].     If you experience worsening of your admission symptoms, develop shortness of breath, life threatening emergency, suicidal or homicidal thoughts you must seek medical attention immediately by calling 911 or calling your MD immediately  if symptoms less severe.  You Must read complete instructions/literature along with all the possible adverse reactions/side effects for all the Medicines you take and that have been prescribed to you. Take any new Medicines after you have completely understood and accpet all the possible adverse reactions/side effects.   Please note  You were cared for by a hospitalist during your hospital stay. If you have any questions about your discharge medications or the care you received while you were in the hospital after you are discharged, you can call the unit and asked to speak with the hospitalist on call if the hospitalist that took care of you is not available. Once you are discharged, your primary care physician will handle any  further medical issues. Please note that NO REFILLS for any discharge medications will be authorized once you are discharged, as it is imperative that you return to your primary care physician (or establish a relationship with a primary care physician if you do not have one) for your aftercare needs so that they can reassess your need for medications and monitor your lab values.    DATA REVIEW:   CBC  Recent Labs Lab 02/28/16 0608  WBC 7.7  HGB 13.4  HCT 38.7*  PLT 115*    Chemistries   Recent Labs Lab 02/27/16 0457  NA 138  K 4.4  CL 108  CO2 21*  GLUCOSE 97  BUN 29*  CREATININE 1.99*  CALCIUM 8.8*    Cardiac Enzymes No results for input(s): TROPONINI in the last 168 hours.  Microbiology Results  Results for orders placed or performed during the hospital encounter of 02/26/16  Urine culture     Status: Abnormal   Collection Time: 02/25/16  6:19 PM  Result Value Ref Range Status   Specimen Description  URINE, CLEAN CATCH  Final   Special Requests NONE  Final   Culture MULTIPLE SPECIES PRESENT, SUGGEST RECOLLECTION (A)  Final   Report Status 02/27/2016 FINAL  Final  MRSA PCR Screening     Status: None   Collection Time: 02/26/16  9:37 PM  Result Value Ref Range Status   MRSA by PCR NEGATIVE NEGATIVE Final    Comment:        The GeneXpert MRSA Assay (FDA approved for NASAL specimens only), is one component of a comprehensive MRSA colonization surveillance program. It is not intended to diagnose MRSA infection nor to guide or monitor treatment for MRSA infections.     RADIOLOGY:  Dg Chest Port 1 View  Result Date: 02/27/2016 CLINICAL DATA:  PICC placement EXAM: PORTABLE CHEST 1 VIEW COMPARISON:  10/17/2015 FINDINGS: Left PICC tip projects in the lower superior vena cava, well positioned. Cardiac silhouette is normal in size. Normal mediastinal and hilar contours. Prominent bronchovascular markings noted bilaterally. Lungs are hyperexpanded but clear. No  pleural effusion or pneumothorax. Bony thorax is demineralized but intact. IMPRESSION: 1. Left PICC tip well-positioned in the lower superior vena cava. 2. No acute cardiopulmonary disease. Electronically Signed   By: Lajean Manes M.D.   On: 02/27/2016 17:13      Management plans discussed with the patient, family and they are in agreement.  CODE STATUS:     Code Status Orders        Start     Ordered   02/26/16 1749  Full code  Continuous     02/26/16 1748    Code Status History    Date Active Date Inactive Code Status Order ID Comments User Context   05/06/2015  6:56 PM 05/15/2015  1:15 AM Full Code [id number]  Henreitta Leber, MD Inpatient   01/17/2015 12:06 PM 01/22/2015  4:45 PM Full Code [id number]  Hillary Bow, MD ED   11/03/2012  3:00 AM 11/11/2012  9:34 PM Full Code [id number]  Rise Patience, MD Inpatient      TOTAL TIME TAKING CARE OF THIS PATIENT: 40 minutes.    Henreitta Leber M.D on 02/28/2016 at 2:28 PM  Between 7am to 6pm - Pager - [phone]  After 6pm go to [email or web address] - Proofreader  [facility]  Office  [phone]  CC: Primary care physician; [name], MD

## 2016-02-28 NOTE — Progress Notes (Signed)
Pt. Transferred to [facility] via [facility]. Pt in no distress, stable condition. Report given to [facility]. Ammie Dalton, RN

## 2016-02-28 NOTE — Progress Notes (Addendum)
CSW informed that patient will transfer to [facility], [place]. Left voicemail for APS Guardian Levander Campion to inform her that patient will transfer today.  Spoke Levander Campion over the phone. Informed her of above.   Ernest Pine, MSW, LCSW, [place] Clinical Social Worker [phone]

## 2016-02-28 NOTE — Progress Notes (Signed)
[facility] [place] NAME: Alan Small    MR#:  [id number]  DATE OF BIRTH:  08-27-1955  SUBJECTIVE:   Pt. here due to abdominal pain and noted to have a urinary tract infection. Still having some lower abdominal pain.  No drainage near supra-pubic cath site.  Afebrile.  Had PICC line placed yesterday.   REVIEW OF SYSTEMS:    Review of Systems  Constitutional: Negative for fever and weight loss.  HENT: Negative for congestion, nosebleeds and tinnitus.   Eyes: Negative for blurred vision, double vision and redness.  Respiratory: Negative for cough, hemoptysis and shortness of breath.   Cardiovascular: Negative for chest pain, orthopnea, leg swelling and PND.  Gastrointestinal: Positive for abdominal pain. Negative for diarrhea, melena, nausea and vomiting.  Genitourinary: Negative for dysuria, hematuria and urgency.  Musculoskeletal: Negative for falls and joint pain.  Neurological: Negative for dizziness, tingling, sensory change, focal weakness, seizures, weakness and headaches.  Endo/Heme/Allergies: Negative for polydipsia. Does not bruise/bleed easily.  Psychiatric/Behavioral: Negative for depression and memory loss. The patient is not nervous/anxious.     Nutrition: Heart Healthy Tolerating Diet: Yes Tolerating PT: Await Eval.   DRUG ALLERGIES:   Allergies  Allergen Reactions  . Mellaril [Thioridazine] Shortness Of Breath and Swelling  . Buprenorphine Itching  . Morphine And Related Itching  . Navane [Thiothixene] Other (See Comments)    Reaction:  GI upset   . Thorazine [Chlorpromazine] Other (See Comments)    Reaction:  Dizziness/fainting     VITALS:  Blood pressure (!) 160/97, pulse 64, temperature 98.2 F (36.8 C), resp. rate 20, height 6' (1.829 m), weight 69.5 kg (153 lb 4.8 oz), SpO2 97 %.  PHYSICAL EXAMINATION:   Physical Exam  GENERAL:  60 y.o.-year-old patient lying in the bed in no acute distress.  EYES:  Pupils equal, round, reactive to light and accommodation. No scleral icterus. Extraocular muscles intact.  HEENT: Head atraumatic, normocephalic. Oropharynx and nasopharynx clear.  NECK:  Supple, no jugular venous distention. No thyroid enlargement, no tenderness.  LUNGS: Normal breath sounds bilaterally, no wheezing, rales, rhonchi. No use of accessory muscles of respiration.  CARDIOVASCULAR: S1, S2 normal. No murmurs, rubs, or gallops.  ABDOMEN: Soft, nontender, nondistended. Bowel sounds present. No organomegaly or mass. + supra-pubic cath in place.  No drainage noted near insertion site.   EXTREMITIES: No cyanosis, clubbing or edema b/l.    NEUROLOGIC: Cranial nerves II through XII are intact. No focal Motor or sensory deficits b/l.   PSYCHIATRIC: The patient is alert and oriented x 3.  SKIN: No obvious rash, lesion, or ulcer.    LABORATORY PANEL:   CBC  Recent Labs Lab 02/28/16 0608  WBC 7.7  HGB 13.4  HCT 38.7*  PLT 115*   ------------------------------------------------------------------------------------------------------------------  Chemistries   Recent Labs Lab 02/27/16 0457  NA 138  K 4.4  CL 108  CO2 21*  GLUCOSE 97  BUN 29*  CREATININE 1.99*  CALCIUM 8.8*   ------------------------------------------------------------------------------------------------------------------  Cardiac Enzymes No results for input(s): TROPONINI in the last 168 hours. ------------------------------------------------------------------------------------------------------------------  RADIOLOGY:  Dg Chest Port 1 View  Result Date: 02/27/2016 CLINICAL DATA:  PICC placement EXAM: PORTABLE CHEST 1 VIEW COMPARISON:  10/17/2015 FINDINGS: Left PICC tip projects in the lower superior vena cava, well positioned. Cardiac silhouette is normal in size. Normal mediastinal and hilar contours. Prominent bronchovascular markings noted bilaterally. Lungs are hyperexpanded but clear. No pleural  effusion or pneumothorax. Bony thorax is demineralized  but intact. IMPRESSION: 1. Left PICC tip well-positioned in the lower superior vena cava. 2. No acute cardiopulmonary disease. Electronically Signed   By: Lajean Manes M.D.   On: 02/27/2016 17:13     ASSESSMENT AND PLAN:   60 year old male with past medical history of paranoid schizophrenia, BPH, previous CVA, history of seizures, previous history of MI, hypothyroidism, depression who presented to the hospital due to abdominal pain and drainage near his suprapubic catheter site.  1. Urinary tract infection-patient's urine culture as an outpatient was positive for multidrug resistant Pseudomonas. -Appreciate infectious disease consult. Patient will need 2 weeks of IV antibiotics with cefepime. -PICC line has been placed and continue cefepime for now.  2. History of urethral stricture status post superior catheter-patient had this done earlier this month. He has noted some drainage around his suprapubic insertion site. - I did not notice any drainage near his supra-pubic cath site.  - appreciate Urology input and no plans for Supra-pubic cath change at this point.  This should be done later while he is on IV abx.  Follow up with Urology at [facility] as outpatient.  -Continue Flomax.  3. Leukocytosis-secondary to the urinary tract infection - improved w/ IV abx.    4. Hypothyroidism-continue Synthroid.  5. History of paranoid schizophrenia-continue Risperdal.  6. Depression-continue Prozac.  D/c to SNF tomorrow.   All the records are reviewed and case discussed with Care Management/Social Worker. Management plans discussed with the patient, family and they are in agreement.  CODE STATUS: Full Code  DVT Prophylaxis: Lovenox  TOTAL TIME TAKING CARE OF THIS PATIENT: 30 minutes.   POSSIBLE D/C IN 1-2 DAYS, DEPENDING ON CLINICAL CONDITION.   Henreitta Leber M.D on 02/28/2016 at 1:12 PM  Between 7am to 6pm - Pager -  [phone]  After 6pm go to [email or web address] - Proofreader  [facility]  Office  [phone]  CC: Primary care physician; [name], MD

## 2016-02-28 NOTE — Progress Notes (Addendum)
CSW was informed that patient would need long-term IV/ABX and SNF placement due to [facility] not being able to meet those needs. CSW left voicemail for Levander Campion to discuss above. Requested PT consult from MD. CSW will continue to follow and assist.   Ernest Pine, MSW, LCSW, [place] Social Worker [phone]

## 2016-02-29 LAB — AEROBIC CULTURE W GRAM STAIN (SUPERFICIAL SPECIMEN)

## 2016-02-29 LAB — AEROBIC CULTURE  (SUPERFICIAL SPECIMEN)

## 2016-06-29 IMAGING — CR DG CHEST 1V PORT
1 series · 2 of 2 positions shown · non-contrast
Comparison: Portable chest x-ray November 02, 2012

CLINICAL DATA: Hypotension and dizziness with history of multiple
fall; hepatitis-C, hypothyroidism and tobacco use

EXAM:
PORTABLE CHEST - 1 VIEW

[Series 1: ap · 0.17mm/px · 2 of 2 slices shown]
[im 1/2]
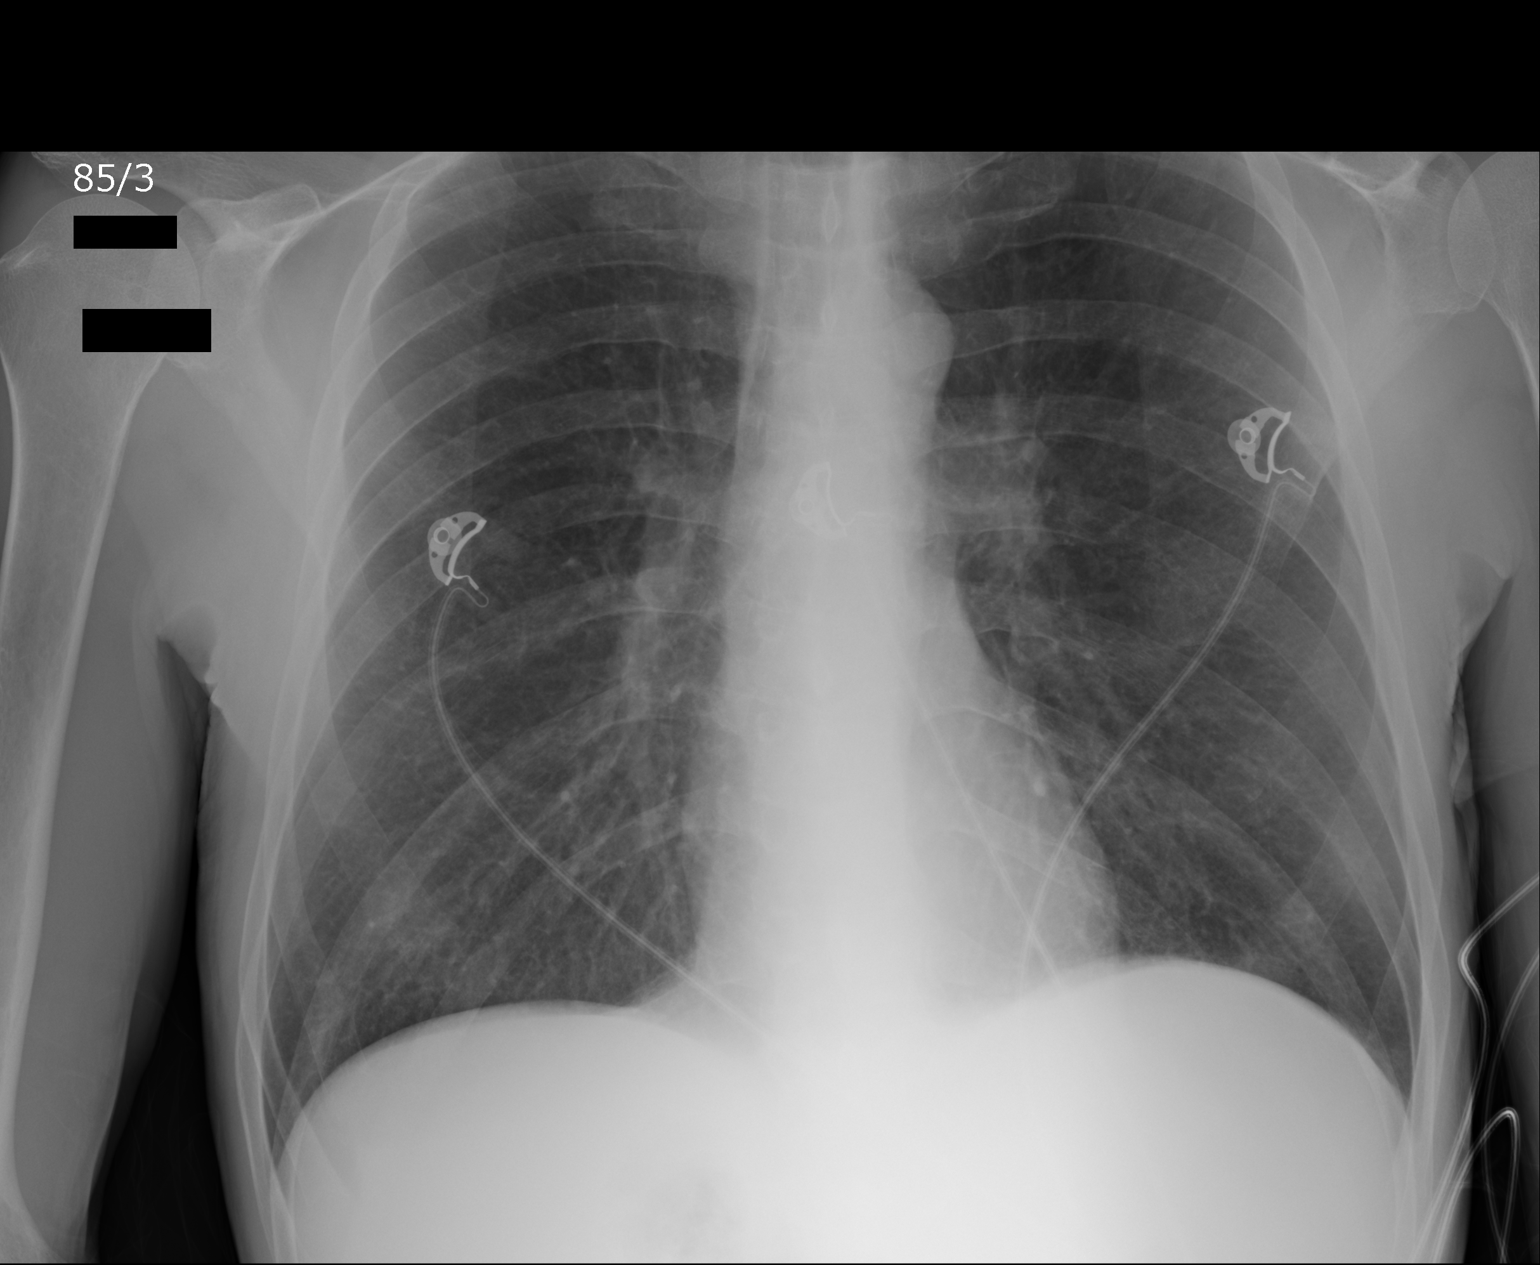
[im 2/2]
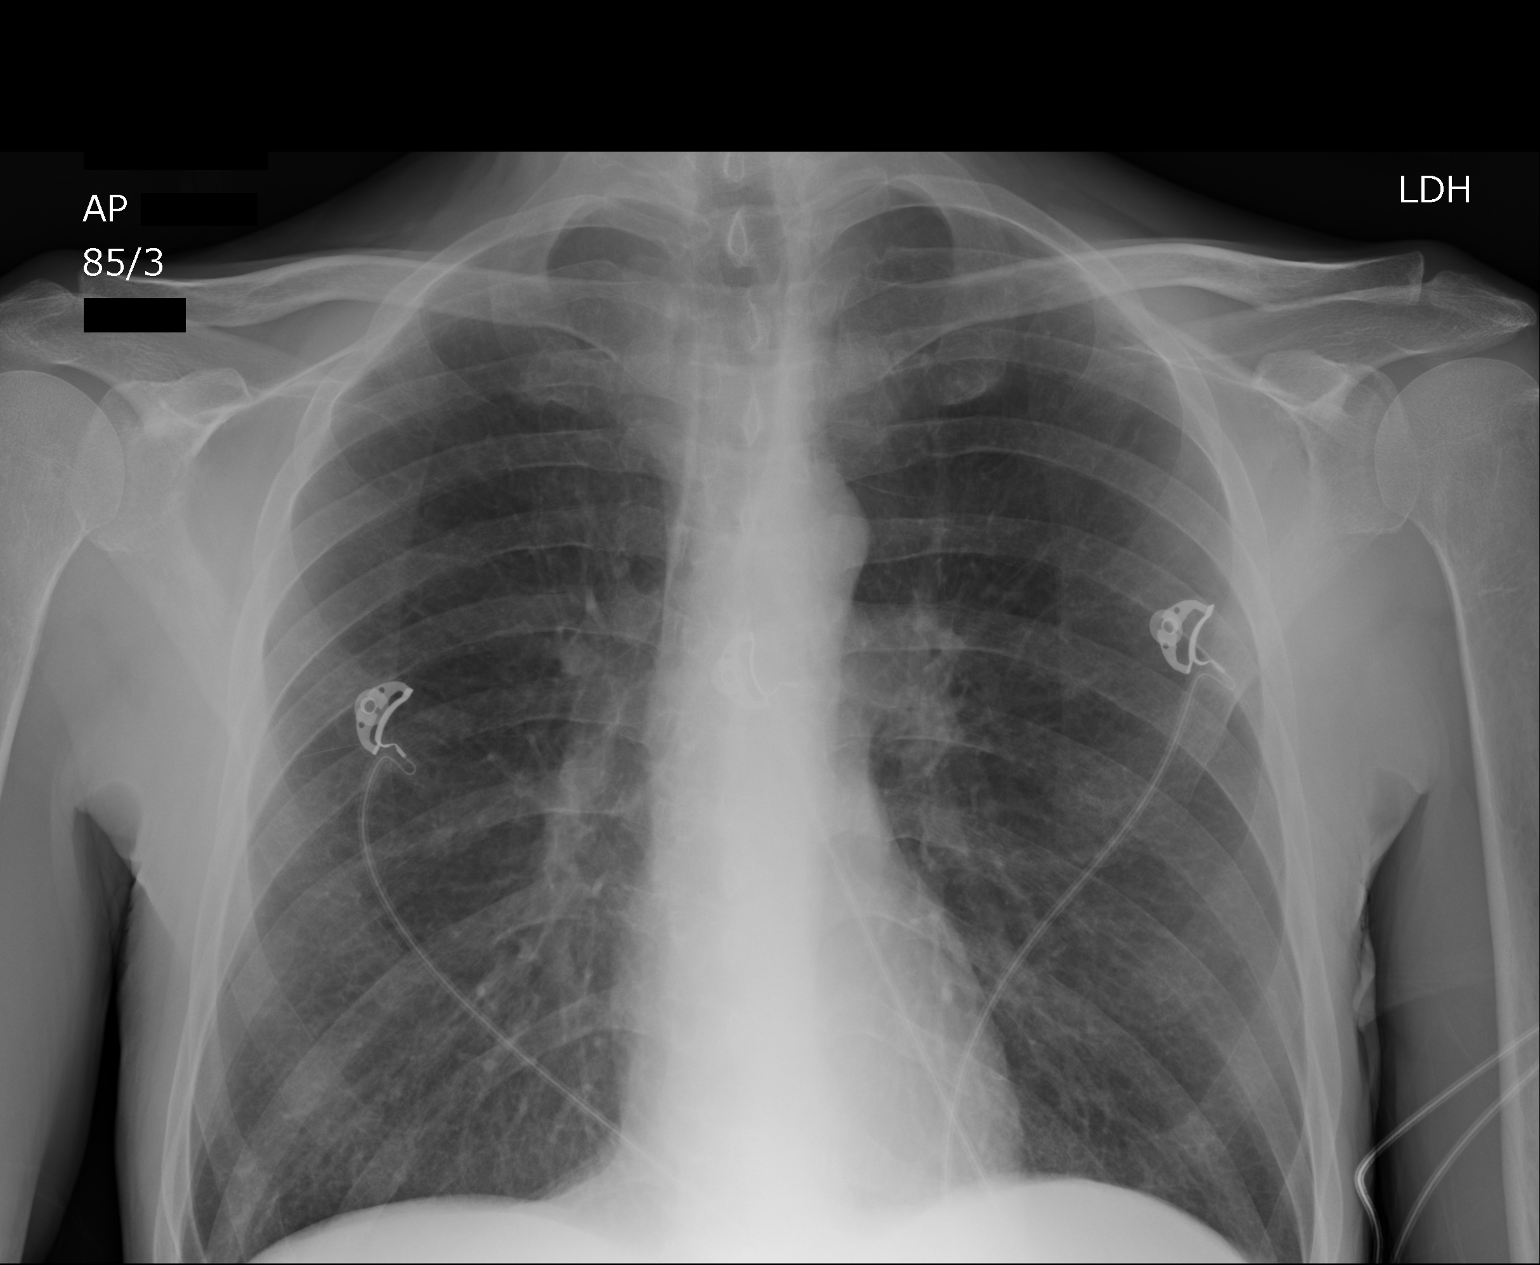

[2 of 2 positions shown; findings below may reference images not displayed]

FINDINGS: The lungs are well-expanded and clear. The heart and mediastinal
structures are normal. There is no pleural effusion or pneumothorax.
The bony structures are unremarkable.
IMPRESSION: There is no active cardiopulmonary disease.

## 2016-06-29 IMAGING — CR DG ABDOMEN 1V
1 series · 2 of 2 positions shown · non-contrast
Comparison: 11/25/2012

CLINICAL DATA: Abdominal pain, history of renal stones

EXAM:
ABDOMEN - 1 VIEW

[Series 1: supine kub · 0.17mm/px · 2 of 2 slices shown]
[im 1/2]
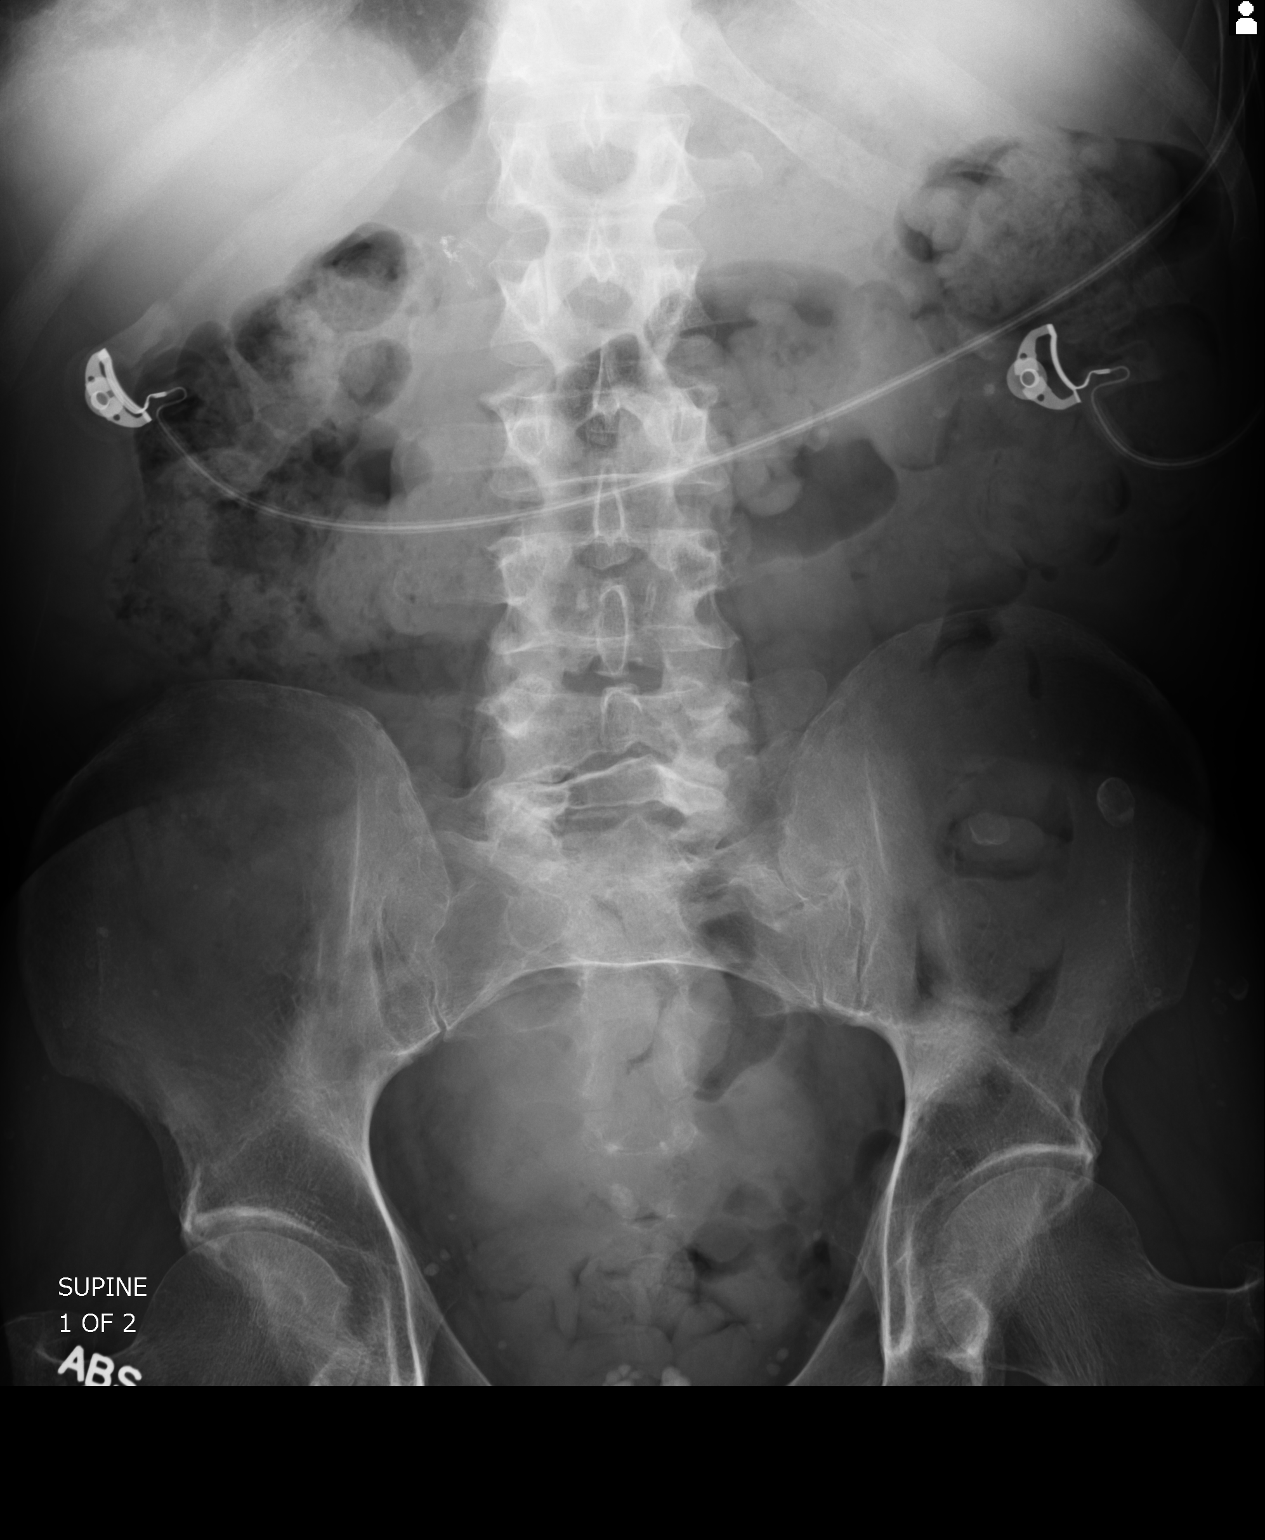
[im 2/2]
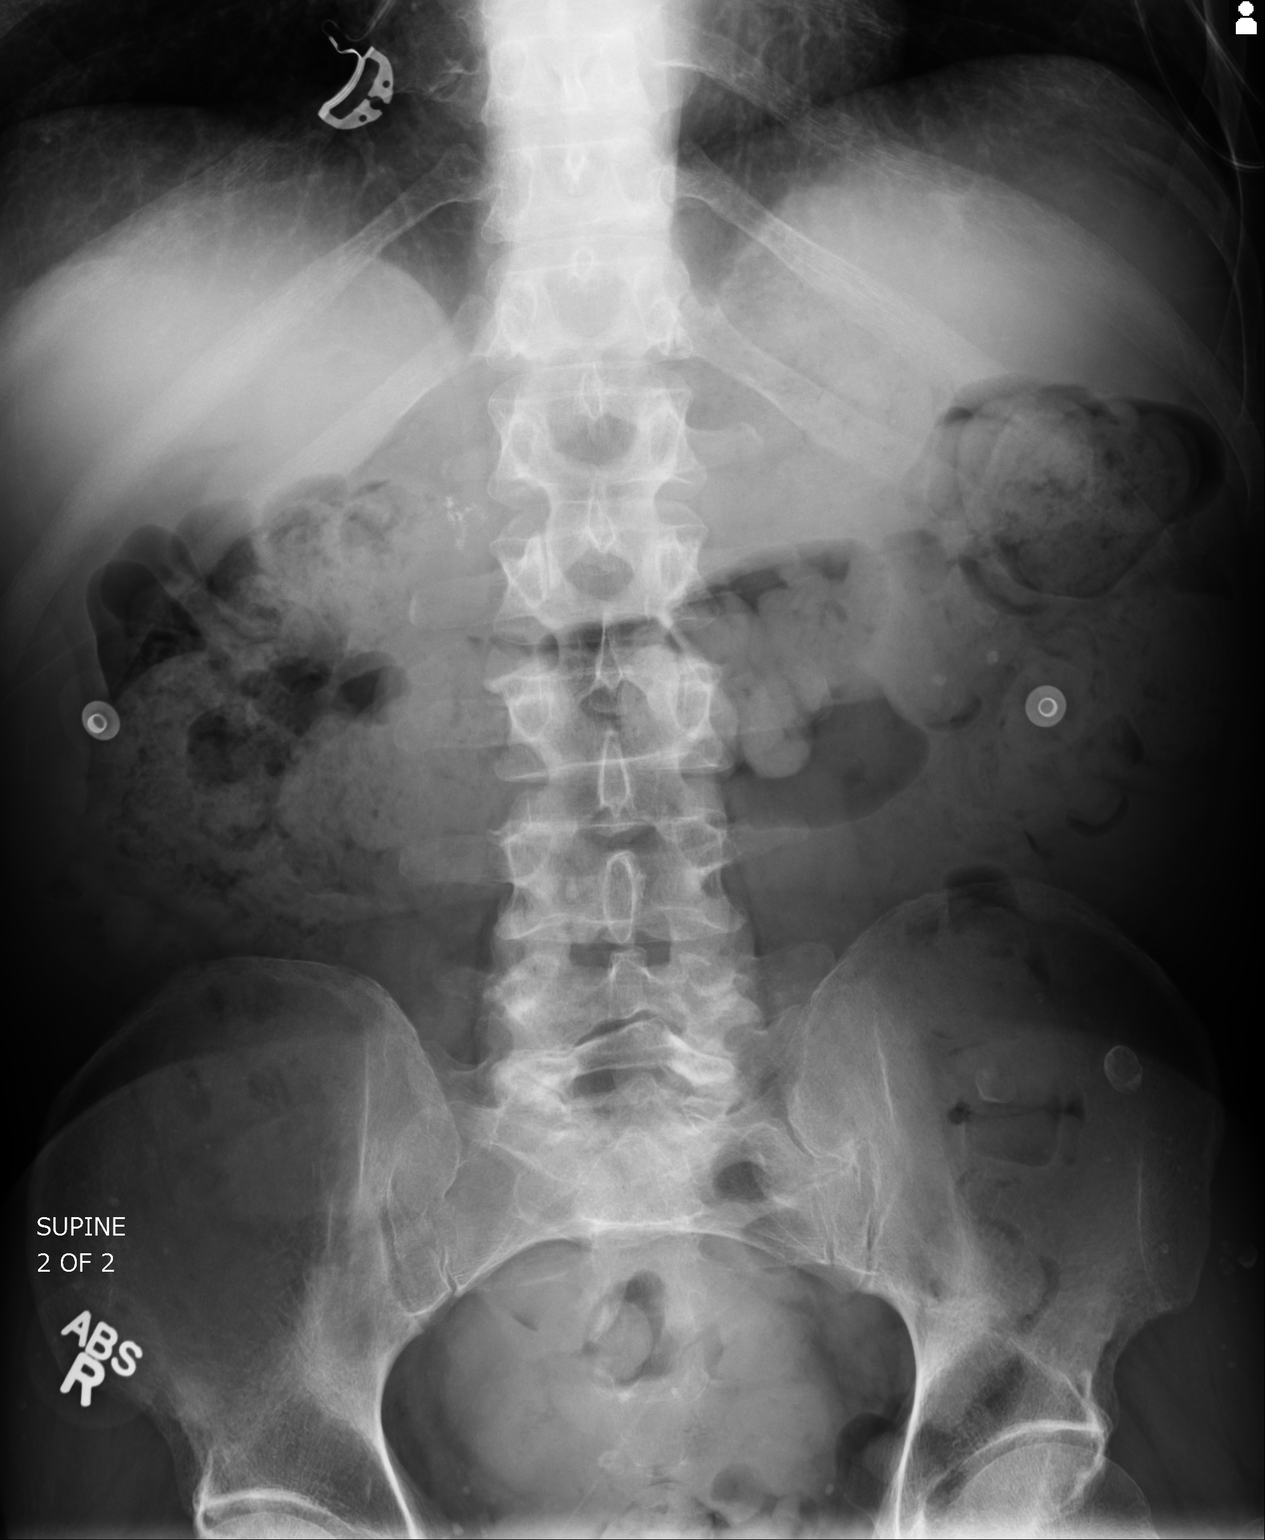

[2 of 2 positions shown; findings below may reference images not displayed]

FINDINGS: Scattered large and small bowel gas is noted. There are small
calcifications measuring less than 5 mm overlying the expected
region of the left kidney. Some vague increased density is noted
overlying the right kidney although it is difficult to assess
whether these represent stones or overlying fecal material. No
definitive ureteral stones are seen. The previously seen distal left
ureteral stones are not well appreciated. There are multiple
calcifications noted in the midline of the pelvis likely related to
bladder calculi.
IMPRESSION: Changes consistent with left rib renal calculi. No definitive
ureteral stones are seen. There are findings suggestive of bladder
calculi.

Questionable calcifications over the right kidney are noted. CT
urography would be helpful for further evaluation.

## 2016-09-25 ENCOUNTER — Other Ambulatory Visit: Payer: Self-pay

## 2016-09-25 ENCOUNTER — Telehealth: Payer: Self-pay

## 2016-09-25 DIAGNOSIS — K5900 Constipation, unspecified: Secondary | ICD-10-CM

## 2016-09-25 NOTE — Telephone Encounter (Signed)
Gastroenterology Pre-Procedure Review  Request Date: 5/8 Requesting Physician: Dr. Allen Norris  PATIENT REVIEW QUESTIONS: The patient responded to the following health history questions as indicated:    1. Are you having any GI issues? Constipation 2. Do you have a personal history of Polyps? Yes 3. Do you have a family history of Colon Cancer or Polyps? yes (sister: polyps) 4. Diabetes Mellitus? no 5. Joint replacements in the past 12 months?no 6. Major health problems in the past 3 months?no 7. Any artificial heart valves, MVP, or defibrillator?no    MEDICATIONS & ALLERGIES:    Patient reports the following regarding taking any anticoagulation/antiplatelet therapy:   Plavix, Coumadin, Eliquis, Xarelto, Lovenox, Pradaxa, Brilinta, or Effient? no Aspirin? no  Patient confirms/reports the following medications:  Current Outpatient Prescriptions  Medication Sig Dispense Refill  . acetaminophen (TYLENOL) 500 MG tablet Take 1,000 mg by mouth every 6 (six) hours as needed for mild pain, moderate pain or fever.    [name] albuterol-ipratropium (COMBIVENT) 18-103 MCG/ACT inhaler Inhale 1 puff into the lungs every 6 (six) hours as needed for wheezing or shortness of breath.     [name] FLUoxetine (PROZAC) 20 MG capsule Take 60 mg by mouth every morning.    . hydrocortisone (ANUSOL-HC) 2.5 % rectal cream Place rectally 2 (two) times daily. (Patient taking differently: Place 1 application rectally 2 (two) times daily as needed. For hemorrhoids) 30 g 0  . levothyroxine (SYNTHROID, LEVOTHROID) 50 MCG tablet Take 50 mcg by mouth daily before breakfast.    . opium-belladonna (B&O SUPPRETTES) 16.2-60 MG suppository Place 1 suppository rectally every 6 (six) hours as needed for bladder spasms. 12 suppository 0  . risperiDONE (RISPERDAL) 0.5 MG tablet Take 0.5 mg by mouth at bedtime. Pt takes with a 3mg  tablet.    . risperiDONE (RISPERDAL) 3 MG tablet Take 3 mg by mouth at bedtime. Pt takes with a 0.5mg  tablet.    .  sodium bicarbonate 650 MG tablet Take 650 mg by mouth 2 (two) times daily.     . sodium chloride 0.9 % infusion Inject 10 mLs into the vein 2 (two) times daily. *Infuse in catheter*    . solifenacin (VESICARE) 5 MG tablet Take 5 mg by mouth every morning.    . tamsulosin (FLOMAX) 0.4 MG CAPS capsule Take 0.4 mg by mouth every morning.      No current facility-administered medications for this visit.     Patient confirms/reports the following allergies:  Allergies  Allergen Reactions  . Mellaril [Thioridazine] Shortness Of Breath and Swelling  . Buprenorphine Itching  . Morphine And Related Itching  . Navane [Thiothixene] Other (See Comments)    Reaction:  GI upset   . Thorazine [Chlorpromazine] Other (See Comments)    Reaction:  Dizziness/fainting     No orders of the defined types were placed in this encounter.   AUTHORIZATION INFORMATION Primary Insurance: 1D#: Group #:  Secondary Insurance: 1D#: Group #:  SCHEDULE INFORMATION: Date: 5/8 Time: Location: [place]

## 2016-09-26 ENCOUNTER — Telehealth: Payer: Self-pay | Admitting: Gastroenterology

## 2016-09-26 NOTE — Telephone Encounter (Signed)
09/26/16 No prior auth required for colonoscopy 44818 / Z86.010 for MCR/MCD.

## 2016-09-30 ENCOUNTER — Other Ambulatory Visit: Payer: Self-pay

## 2016-09-30 DIAGNOSIS — K5900 Constipation, unspecified: Secondary | ICD-10-CM

## 2016-10-07 ENCOUNTER — Telehealth: Payer: Self-pay | Admitting: Gastroenterology

## 2016-10-07 ENCOUNTER — Other Ambulatory Visit: Payer: Self-pay

## 2016-10-07 MED ORDER — PEG 3350-KCL-NABCB-NACL-NASULF 236 G PO SOLR: ORAL | 0 refills | Status: DC

## 2016-10-07 NOTE — Telephone Encounter (Signed)
Lennie Odor (caregiver) [phone] LVM for his prep to be called into [facility].

## 2016-10-07 NOTE — Telephone Encounter (Signed)
Lennie Odor notified prep was called into [facility].

## 2016-10-09 NOTE — Discharge Instructions (Signed)

## 2016-10-10 ENCOUNTER — Ambulatory Visit: Payer: Medicare Other | Admitting: Anesthesiology

## 2016-10-10 ENCOUNTER — Encounter: Admission: RE | Payer: Self-pay | Source: Ambulatory Visit

## 2016-10-10 ENCOUNTER — Encounter: Payer: Self-pay | Admitting: Gastroenterology

## 2016-10-10 ENCOUNTER — Encounter
Admission: RE | Disposition: A | Discharge status: Home / Self Care | Payer: Self-pay | Source: Ambulatory Visit | Attending: Gastroenterology

## 2016-10-10 ENCOUNTER — Ambulatory Visit: Admission: RE | Admit: 2016-10-10 | Payer: Medicare Other | Source: Ambulatory Visit | Admitting: Gastroenterology

## 2016-10-10 ENCOUNTER — Ambulatory Visit
Admission: RE | Admit: 2016-10-10 | Discharge: 2016-10-10 | Disposition: A | Discharge status: Home / Self Care | Payer: Medicare Other | Source: Ambulatory Visit | Attending: Gastroenterology | Admitting: Gastroenterology

## 2016-10-10 DIAGNOSIS — J449 Chronic obstructive pulmonary disease, unspecified: Secondary | ICD-10-CM | POA: Diagnosis not present

## 2016-10-10 DIAGNOSIS — F200 Paranoid schizophrenia: Secondary | ICD-10-CM | POA: Diagnosis not present

## 2016-10-10 DIAGNOSIS — K635 Polyp of colon: Secondary | ICD-10-CM | POA: Insufficient documentation

## 2016-10-10 DIAGNOSIS — F329 Major depressive disorder, single episode, unspecified: Secondary | ICD-10-CM | POA: Diagnosis not present

## 2016-10-10 DIAGNOSIS — F17210 Nicotine dependence, cigarettes, uncomplicated: Secondary | ICD-10-CM | POA: Insufficient documentation

## 2016-10-10 DIAGNOSIS — K921 Melena: Secondary | ICD-10-CM

## 2016-10-10 DIAGNOSIS — E039 Hypothyroidism, unspecified: Secondary | ICD-10-CM | POA: Diagnosis not present

## 2016-10-10 DIAGNOSIS — K5730 Diverticulosis of large intestine without perforation or abscess without bleeding: Secondary | ICD-10-CM | POA: Insufficient documentation

## 2016-10-10 DIAGNOSIS — Z79899 Other long term (current) drug therapy: Secondary | ICD-10-CM | POA: Diagnosis not present

## 2016-10-10 DIAGNOSIS — K5900 Constipation, unspecified: Secondary | ICD-10-CM | POA: Diagnosis not present

## 2016-10-10 DIAGNOSIS — I252 Old myocardial infarction: Secondary | ICD-10-CM | POA: Diagnosis not present

## 2016-10-10 DIAGNOSIS — R194 Change in bowel habit: Secondary | ICD-10-CM | POA: Diagnosis not present

## 2016-10-10 DIAGNOSIS — K640 First degree hemorrhoids: Secondary | ICD-10-CM | POA: Diagnosis not present

## 2016-10-10 DIAGNOSIS — Z905 Acquired absence of kidney: Secondary | ICD-10-CM | POA: Diagnosis not present

## 2016-10-10 DIAGNOSIS — Z7951 Long term (current) use of inhaled steroids: Secondary | ICD-10-CM | POA: Diagnosis not present

## 2016-10-10 DIAGNOSIS — D125 Benign neoplasm of sigmoid colon: Secondary | ICD-10-CM

## 2016-10-10 DIAGNOSIS — Z8673 Personal history of transient ischemic attack (TIA), and cerebral infarction without residual deficits: Secondary | ICD-10-CM | POA: Diagnosis not present

## 2016-10-10 DIAGNOSIS — N289 Disorder of kidney and ureter, unspecified: Secondary | ICD-10-CM | POA: Insufficient documentation

## 2016-10-10 HISTORY — PX: COLONOSCOPY WITH PROPOFOL: SHX5780

## 2016-10-10 HISTORY — DX: Chronic obstructive pulmonary disease, unspecified: J44.9

## 2016-10-10 HISTORY — DX: Gastro-esophageal reflux disease without esophagitis: K21.9

## 2016-10-10 HISTORY — PX: POLYPECTOMY: SHX5525

## 2016-10-10 SURGERY — COLONOSCOPY WITH PROPOFOL
Anesthesia: General

## 2016-10-10 SURGERY — COLONOSCOPY WITH PROPOFOL
Anesthesia: Monitor Anesthesia Care | Wound class: Contaminated

## 2016-10-10 MED ORDER — LIDOCAINE HCL (CARDIAC) 20 MG/ML IV SOLN
INTRAVENOUS | Status: DC | PRN
  Administered 2016-10-10: 50 mg via INTRAVENOUS

## 2016-10-10 MED ORDER — LACTATED RINGERS IV SOLN
1000.0000 mL | INTRAVENOUS | Status: DC
  Administered 2016-10-10: 1000 mL via INTRAVENOUS

## 2016-10-10 MED ORDER — STERILE WATER FOR IRRIGATION IR SOLN
Status: DC | PRN
  Administered 2016-10-10: 11:00:00

## 2016-10-10 MED ORDER — PROPOFOL 10 MG/ML IV BOLUS
INTRAVENOUS | Status: DC | PRN
  Administered 2016-10-10: 10 mg via INTRAVENOUS
  Administered 2016-10-10 (×2): 20 mg via INTRAVENOUS
  Administered 2016-10-10: 30 mg via INTRAVENOUS
  Administered 2016-10-10: 20 mg via INTRAVENOUS
  Administered 2016-10-10: 80 mg via INTRAVENOUS
  Administered 2016-10-10: 20 mg via INTRAVENOUS
  Administered 2016-10-10: 30 mg via INTRAVENOUS
  Administered 2016-10-10: 20 mg via INTRAVENOUS

## 2016-10-10 SURGICAL SUPPLY — 23 items

## 2016-10-10 NOTE — Anesthesia Postprocedure Evaluation (Signed)
Anesthesia Post Note  Patient: Alan Small  Procedure(s) Performed: Procedure(s) (LRB): COLONOSCOPY WITH PROPOFOL (N/A) POLYPECTOMY  Patient location during evaluation: PACU Anesthesia Type: MAC Level of consciousness: awake Pain management: pain level controlled Vital Signs Assessment: post-procedure vital signs reviewed and stable Respiratory status: spontaneous breathing Cardiovascular status: blood pressure returned to baseline Postop Assessment: no headache Anesthetic complications: no    Lavonna Monarch

## 2016-10-10 NOTE — Op Note (Signed)
[facility] Gastroenterology Patient Name: Alan Small Procedure Date: 10/10/2016 10:14 AM MRN: [id number] Account #: [id number] Date of Birth: 1955-12-23 Admit Type: Outpatient Age: 61 Room: [facility] OR ROOM 01 Gender: Male Note Status: Finalized Procedure:            Colonoscopy Indications:          Hematochezia, Change in bowel habits, Constipation Providers:            Lucilla Lame MD, MD Referring MD:         Baxter Kail. Rebeca Alert MD, MD (Referring MD) Medicines:            Propofol per Anesthesia Complications:        No immediate complications. Procedure:            Pre-Anesthesia Assessment:                       - Prior to the procedure, a History and Physical was                        performed, and patient medications and allergies were                        reviewed. The patient's tolerance of previous                        anesthesia was also reviewed. The risks and benefits of                        the procedure and the sedation options and risks were                        discussed with the patient. All questions were                        answered, and informed consent was obtained. Prior                        Anticoagulants: The patient has taken no previous                        anticoagulant or antiplatelet agents. ASA Grade                        Assessment: II - A patient with mild systemic disease.                        After reviewing the risks and benefits, the patient was                        deemed in satisfactory condition to undergo the                        procedure.                       After obtaining informed consent, the colonoscope was                        passed under direct vision. Throughout the procedure,  the patient's blood pressure, pulse, and oxygen                        saturations were monitored continuously. The [place] [id number]) was introduced  through the                        anus and advanced to the the cecum, identified by                        appendiceal orifice and ileocecal valve. The                        colonoscopy was performed without difficulty. The                        patient tolerated the procedure well. The quality of                        the bowel preparation was good. Findings:      The perianal and digital rectal examinations were normal.      Multiple small-mouthed diverticula were found in the sigmoid colon.      Two sessile polyps were found in the sigmoid colon. The polyps were 2 to       3 mm in size. These polyps were removed with a cold biopsy forceps.       Resection and retrieval were complete.      Non-bleeding internal hemorrhoids were found during retroflexion. The       hemorrhoids were Grade I (internal hemorrhoids that do not prolapse). Impression:           - Diverticulosis in the sigmoid colon.                       - Two 2 to 3 mm polyps in the sigmoid colon, removed                        with a cold biopsy forceps. Resected and retrieved.                       - Non-bleeding internal hemorrhoids. Recommendation:       - Discharge patient to home.                       - Resume previous diet.                       - Continue present medications.                       - Await pathology results.                       - Repeat colonoscopy in 5 years for surveillance. Procedure Code(s):    --- Professional ---                       [phone], Colonoscopy, flexible; with biopsy, single or  multiple Diagnosis Code(s):    --- Professional ---                       K92.1, Melena (includes Hematochezia)                       R19.4, Change in bowel habit                       K59.00, Constipation, unspecified                       D12.5, Benign neoplasm of sigmoid colon CPT copyright 2016 American Medical Association. All rights reserved. The codes documented in this  report are preliminary and upon coder review may  be revised to meet current compliance requirements. Lucilla Lame MD, MD 10/10/2016 10:45:38 AM This report has been signed electronically. Number of Addenda: 0 Note Initiated On: 10/10/2016 10:14 AM Scope Withdrawal Time: 0 hours 6 minutes 41 seconds  Total Procedure Duration: 0 hours 11 minutes 45 seconds       [facility]

## 2016-10-10 NOTE — Anesthesia Procedure Notes (Signed)
Procedure Name: MAC Performed by: Mayme Genta Pre-anesthesia Checklist: Patient identified, Emergency Drugs available, Suction available, Timeout performed and Patient being monitored Patient Re-evaluated:Patient Re-evaluated prior to inductionOxygen Delivery Method: Nasal cannula Placement Confirmation: positive ETCO2

## 2016-10-10 NOTE — Anesthesia Preprocedure Evaluation (Addendum)
Anesthesia Evaluation  Patient identified by MRN, date of birth, ID band Patient awake    Reviewed: Allergy & Precautions, NPO status , Patient's Chart, lab work & pertinent test results, reviewed documented beta blocker date and time   Airway Mallampati: I  TM Distance: >3 FB Neck ROM: Full    Dental  (+) Edentulous Upper, Edentulous Lower   Pulmonary asthma , COPD, Current Smoker,    Pulmonary exam normal breath sounds clear to auscultation       Cardiovascular + Past MI (X2 in early 1980s, no heart trouble since then, no stents)  Normal cardiovascular exam Rhythm:Regular Rate:Normal     Neuro/Psych Seizures -,  Depression Schizophrenia CVA (X4 in 1980s, no residual deficits), No Residual Symptoms    GI/Hepatic Neg liver ROS, GERD  ,  Endo/Other  Hypothyroidism   Renal/GU CRFRenal diseaseRecurrent nephro and infections   Recurrent nephro and infections, s/p nephrectomy with Stage IV CKD    Musculoskeletal negative musculoskeletal ROS (+)   Abdominal Normal abdominal exam  (+)  Abdomen: soft.    Peds  Hematology negative hematology ROS (+)   Anesthesia Other Findings   Reproductive/Obstetrics                            Anesthesia Physical Anesthesia Plan  ASA: III  Anesthesia Plan: MAC   Post-op Pain Management:    Induction:   Airway Management Planned: Mask  Additional Equipment: None  Intra-op Plan:   Post-operative Plan:   Informed Consent: I have reviewed the patients History and Physical, chart, labs and discussed the procedure including the risks, benefits and alternatives for the proposed anesthesia with the patient or authorized representative who has indicated his/her understanding and acceptance.     Plan Discussed with: CRNA, Anesthesiologist and Surgeon  Anesthesia Plan Comments:         Anesthesia Quick Evaluation

## 2016-10-10 NOTE — Transfer of Care (Signed)
Immediate Anesthesia Transfer of Care Note  Patient: Alan Small  Procedure(s) Performed: Procedure(s): COLONOSCOPY WITH PROPOFOL (N/A) POLYPECTOMY  Patient Location: PACU  Anesthesia Type: MAC  Level of Consciousness: awake, alert  and patient cooperative  Airway and Oxygen Therapy: Patient Spontanous Breathing and Patient connected to supplemental oxygen  Post-op Assessment: Post-op Vital signs reviewed, Patient's Cardiovascular Status Stable, Respiratory Function Stable, Patent Airway and No signs of Nausea or vomiting  Post-op Vital Signs: Reviewed and stable  Complications: No apparent anesthesia complications

## 2016-10-10 NOTE — H&P (Signed)
Lucilla Lame, MD [address] [place], [place] [address] Phone:[phone] Fax : [phone]  Primary Care Physician:  [name], MD Primary Gastroenterologist:  Dr. Allen Norris  Pre-Procedure History & Physical: HPI:  Alan Small is a 61 y.o. male is here for an colonoscopy.   Past Medical History:  Diagnosis Date  . Asthma   . COPD (chronic obstructive pulmonary disease) ([place])   . Depression   . External hemorrhoids   . Gastric ulcer   . GERD (gastroesophageal reflux disease)   . Hydronephrosis   . Hypothyroidism    hypo  . Insomnia   . Mental disorder   . Myocardial infarction ([place])   . Paranoid schizophrenia ([place])   . Renal disorder    renal failure  . Seizures ([place])   . Stroke [facility])     Past Surgical History:  Procedure Laterality Date  . APPENDECTOMY    . COLONOSCOPY WITH PROPOFOL N/A 12/07/2014   Procedure: COLONOSCOPY WITH PROPOFOL;  Surgeon: Manya Silvas, MD;  Location: [facility] ENDOSCOPY;  Service: Endoscopy;  Laterality: N/A;  . ESOPHAGOGASTRODUODENOSCOPY N/A 12/07/2014   Procedure: ESOPHAGOGASTRODUODENOSCOPY (EGD);  Surgeon: Manya Silvas, MD;  Location: [facility] ENDOSCOPY;  Service: Endoscopy;  Laterality: N/A;  . NEPHRECTOMY    . SUPRAPUBIC CATHETER INSERTION      Prior to Admission medications   Medication Sig Start Date End Date Taking? Authorizing Provider  acetaminophen (TYLENOL) 500 MG tablet Take 1,000 mg by mouth every 6 (six) hours as needed for mild pain, moderate pain or fever.   Yes [provider]  albuterol-ipratropium (COMBIVENT) 18-103 MCG/ACT inhaler Inhale 1 puff into the lungs every 6 (six) hours as needed for wheezing or shortness of breath.    Yes [provider]  benztropine (COGENTIN) 0.5 MG tablet Take 0.5 mg by mouth 2 (two) times daily.   Yes [provider]  budesonide-formoterol (SYMBICORT) 160-4.5 MCG/ACT inhaler Inhale into the lungs. 03/03/16  Yes [provider]    FLUoxetine (PROZAC) 20 MG capsule Take 60 mg by mouth every morning.   Yes [provider]  hydrocortisone (ANUSOL-HC) 2.5 % rectal cream Place rectally 2 (two) times daily. Patient taking differently: Place 1 application rectally 2 (two) times daily as needed. For hemorrhoids 05/11/15  Yes Gladstone Lighter, MD  levothyroxine (SYNTHROID, LEVOTHROID) 50 MCG tablet Take 50 mcg by mouth daily before breakfast.   Yes [provider]  opium-belladonna (B&O SUPPRETTES) 16.2-60 MG suppository Place 1 suppository rectally every 6 (six) hours as needed for bladder spasms. 05/11/15  Yes Gladstone Lighter, MD  polyethylene glycol (GOLYTELY) 236 g solution Drink one 8 oz glass every 20 mins until stools are clear. 10/07/16  Yes Lucilla Lame, MD  risperiDONE (RISPERDAL) 0.5 MG tablet Take 0.5 mg by mouth at bedtime. Pt takes with a 3mg  tablet.   Yes [provider]  risperiDONE (RISPERDAL) 3 MG tablet Take 3 mg by mouth at bedtime. Pt takes with a 0.5mg  tablet.   Yes [provider]  solifenacin (VESICARE) 5 MG tablet Take 5 mg by mouth every morning.   Yes [provider]  tamsulosin (FLOMAX) 0.4 MG CAPS capsule Take 0.4 mg by mouth every morning.    Yes [provider]  ciprofloxacin (CIPRO) 500 MG tablet  08/12/16   [provider]  nitrofurantoin, macrocrystal-monohydrate, (MACROBID) 100 MG capsule  09/02/16   [provider]  QUEtiapine (SEROQUEL) 200 MG tablet  09/08/16   [provider]  sodium bicarbonate 650 MG tablet Take 650 mg by mouth 2 (two) times daily.     [provider]  sodium chloride 0.9 % infusion Inject 10 mLs into the vein 2 (two) times daily. *Infuse in catheter*    [provider]    Allergies as of 09/30/2016 - Review Complete 02/26/2016  Allergen Reaction Noted  . Mellaril [thioridazine] Shortness Of Breath and Swelling 05/07/2015  . Buprenorphine Itching 07/13/2015  . Morphine and  related Itching 11/03/2012  . Navane [thiothixene] Other (See Comments) 11/03/2012  . Thorazine [chlorpromazine] Other (See Comments) 05/07/2015    Family History  Problem Relation Age of Onset  . Stroke Father   . Pneumonia Father   . Brain cancer Mother     Social History   Social History  . Marital status: Single    Spouse name: N/A  . Number of children: N/A  . Years of education: N/A   Occupational History  . Not on file.   Social History Main Topics  . Smoking status: Current Every Day Smoker    Packs/day: 1.00    Years: 55.00    Types: Cigarettes  . Smokeless tobacco: Former Systems developer    Types: Chew  . Alcohol use No  . Drug use: No  . Sexual activity: Not on file   Other Topics Concern  . Not on file   Social History Narrative  . No narrative on file    Review of Systems: See HPI, otherwise negative ROS  Physical Exam: BP 103/75   Pulse 77   Temp 98.6 F (37 C) (Temporal)   Resp 16   Ht 6' (1.829 m)   Wt 157 lb (71.2 kg)   SpO2 98%   BMI 21.29 kg/m  General:   Alert,  pleasant and cooperative in NAD Head:  Normocephalic and atraumatic. Neck:  Supple; no masses or thyromegaly. Lungs:  Clear throughout to auscultation.    Heart:  Regular rate and rhythm. Abdomen:  Soft, nontender and nondistended. Normal bowel sounds, without guarding, and without rebound.   Neurologic:  Alert and  oriented x4;  grossly normal neurologically.  Impression/Plan: Alan Small is here for an colonoscopy to be performed for hematochezia and rectal bleeding  Risks, benefits, limitations, and alternatives regarding  colonoscopy have been reviewed with the patient.  Questions have been answered.  All parties agreeable.   Lucilla Lame, MD  10/10/2016, 10:16 AM

## 2016-10-14 ENCOUNTER — Encounter: Payer: Self-pay | Admitting: Gastroenterology

## 2017-03-24 IMAGING — US US RENAL
1 series · 14 of 25 positions shown · non-contrast
Comparison: CT 11/06/2012

CLINICAL DATA: Acute renal failure.  Prior right nephrectomy

EXAM:
RENAL / URINARY TRACT ULTRASOUND COMPLETE

[Series 1: us renal · 0.28mm/px · 14 of 45 slices shown]
[im 1/45]
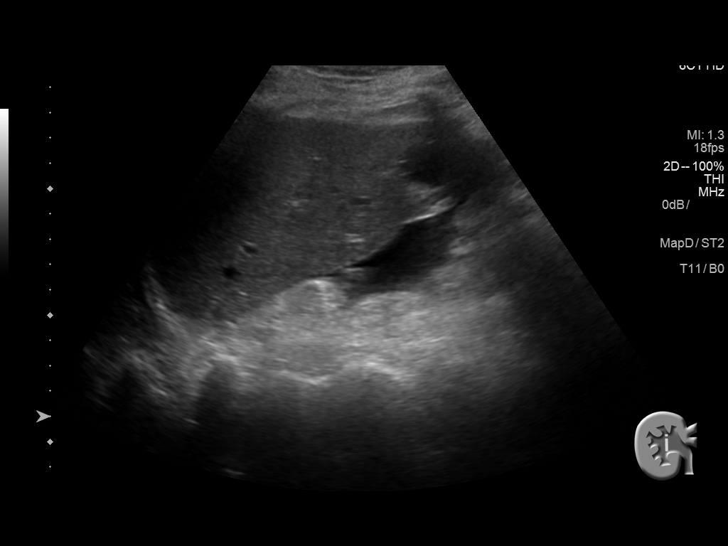
[im 4/45]
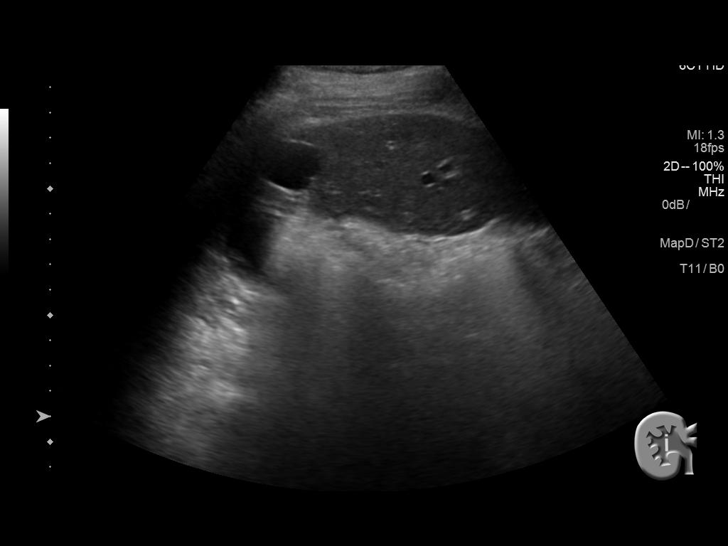
[im 8/45]
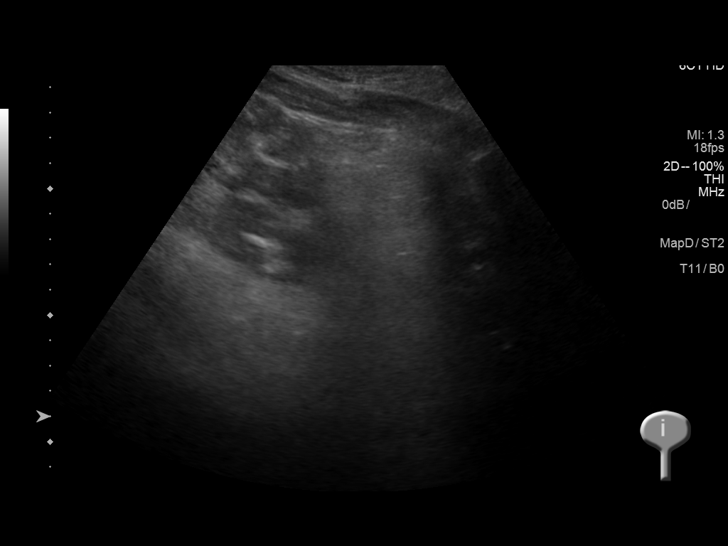
[im 12/45]
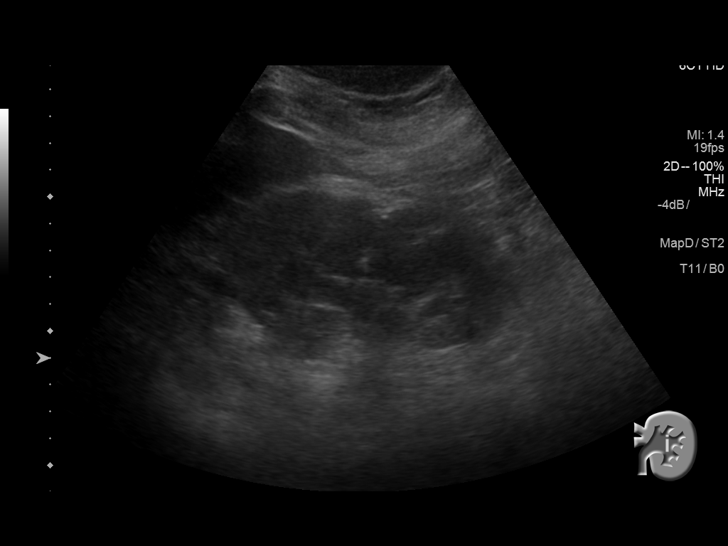
[im 15/45]
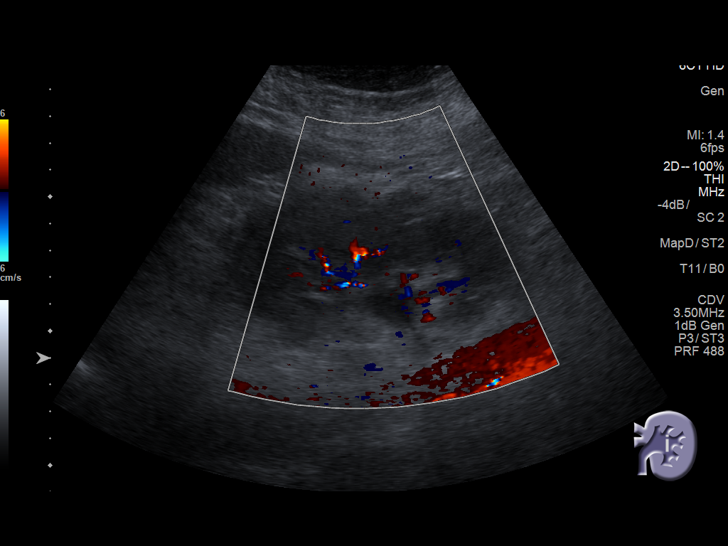
[im 17/45]
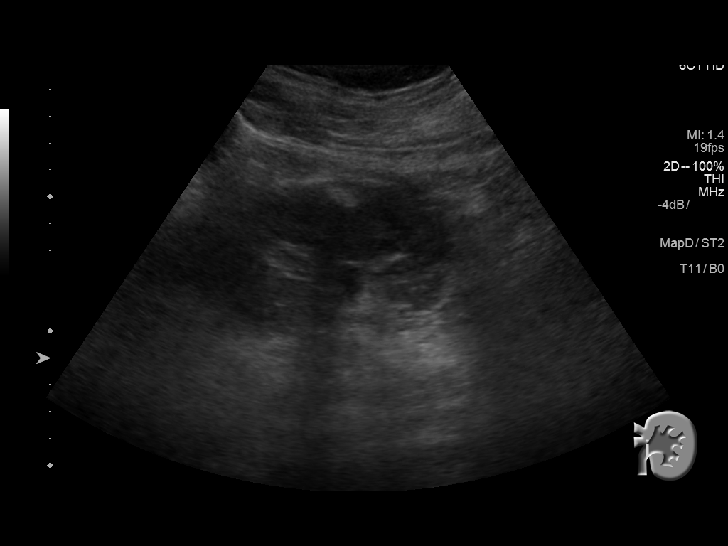
[im 21/45]
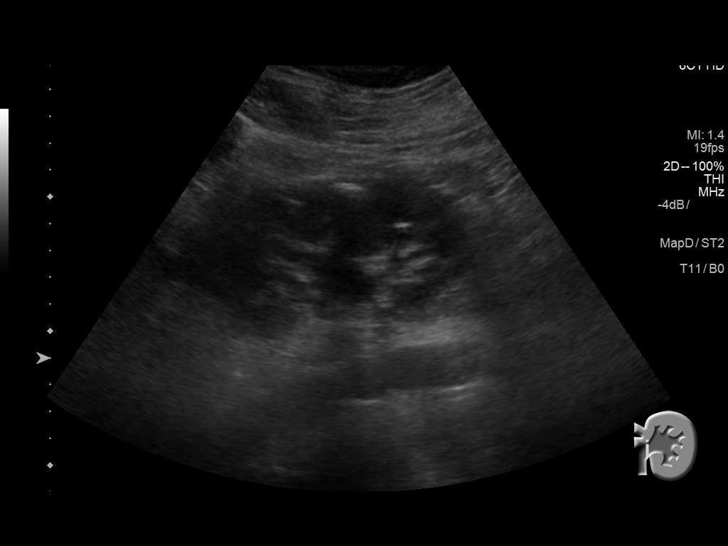
[im 24/45]
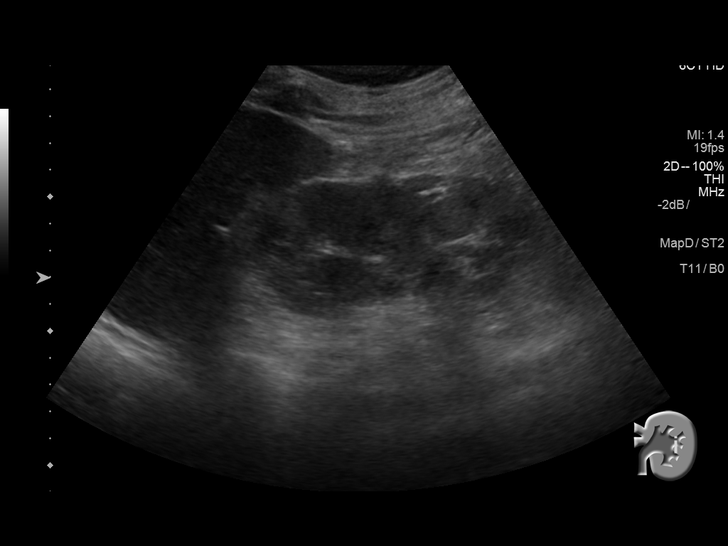
[im 28/45]
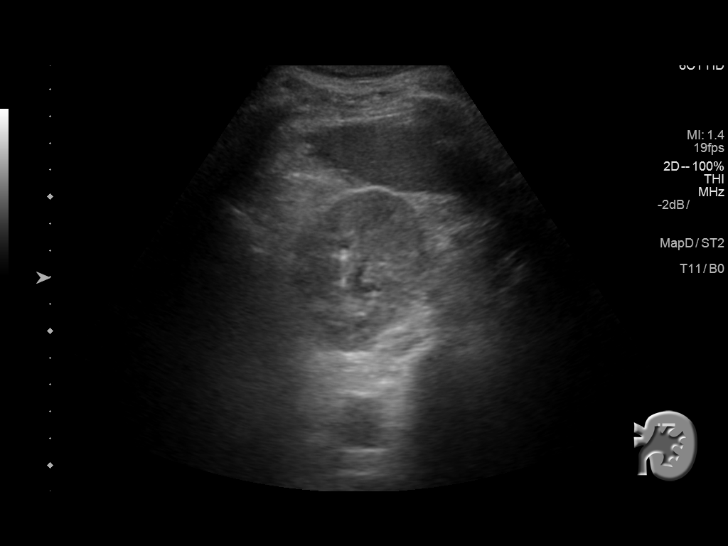
[im 30/45]
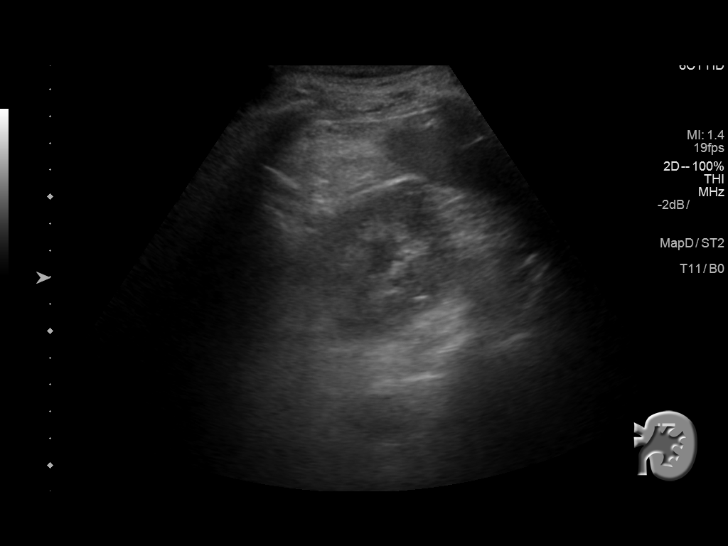
[im 34/45]
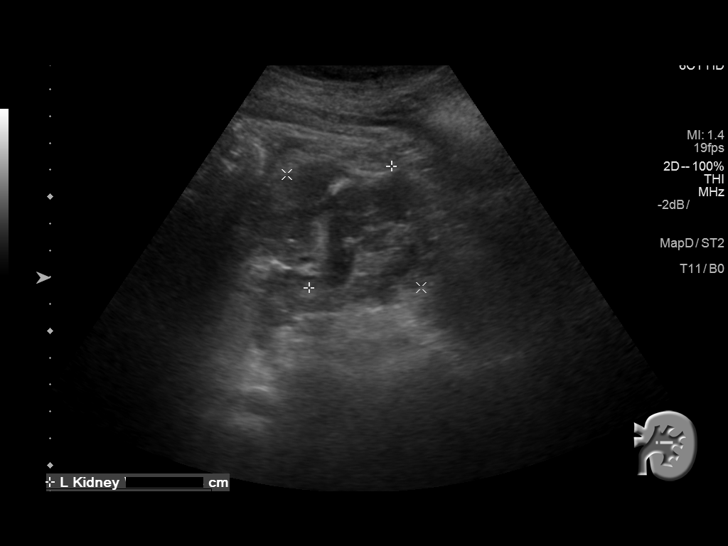
[im 37/45]
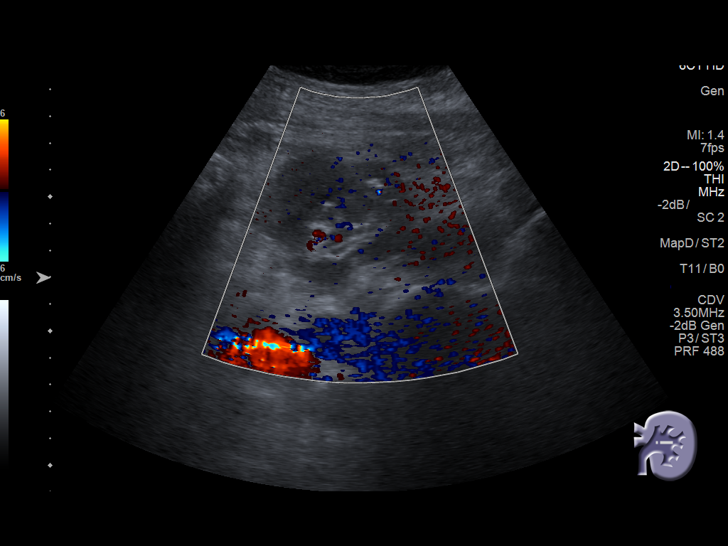
[im 41/45]
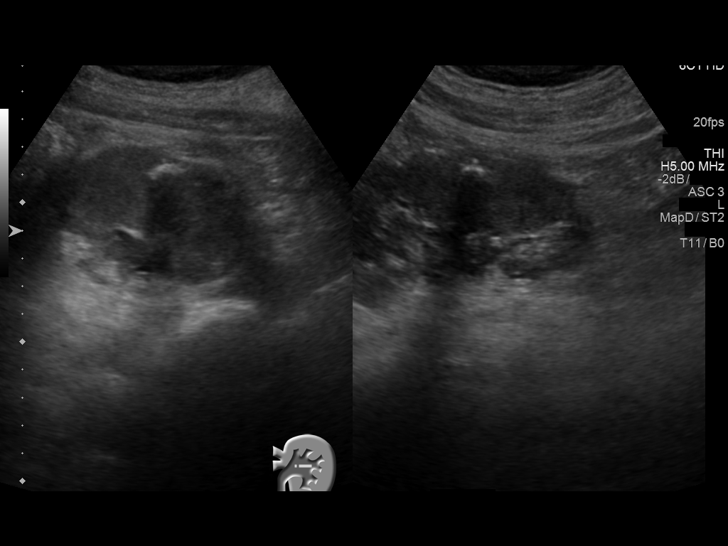
[im 45/45]
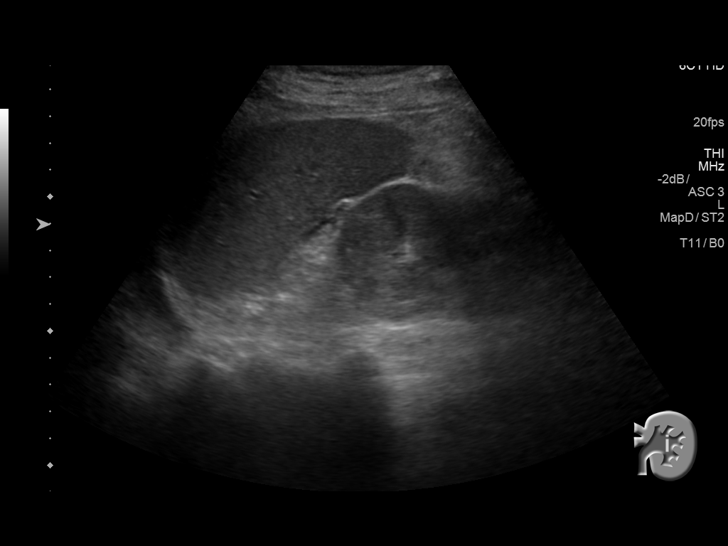

[14 of 25 positions shown; findings below may reference images not displayed]

FINDINGS: Right Kidney:

Length: Prior right nephrectomy.

Left Kidney:

Length: 11.8 cm. Calcification in the midpole measures 12 mm. This
is located peripherally and could be parenchymal calcification or
within the collecting system in an area of scarring and overlying
cortical thinning. No hydronephrosis.

Bladder:

Decompressed with Foley catheter in place.
IMPRESSION: Prior right nephrectomy. No hydronephrosis on the left. Parenchymal
calcification versus nonobstructing 12 mm midpole stone on the left
as described above.

## 2017-08-11 IMAGING — CR DG CHEST 1V PORT
1 series · 1 of 1 positions shown · non-contrast
Comparison: 12/05/2013

CLINICAL DATA: Increase shortness of breath today post fall.
Generalized weakness, sepsis.

EXAM:
PORTABLE CHEST - 1 VIEW

[portable]
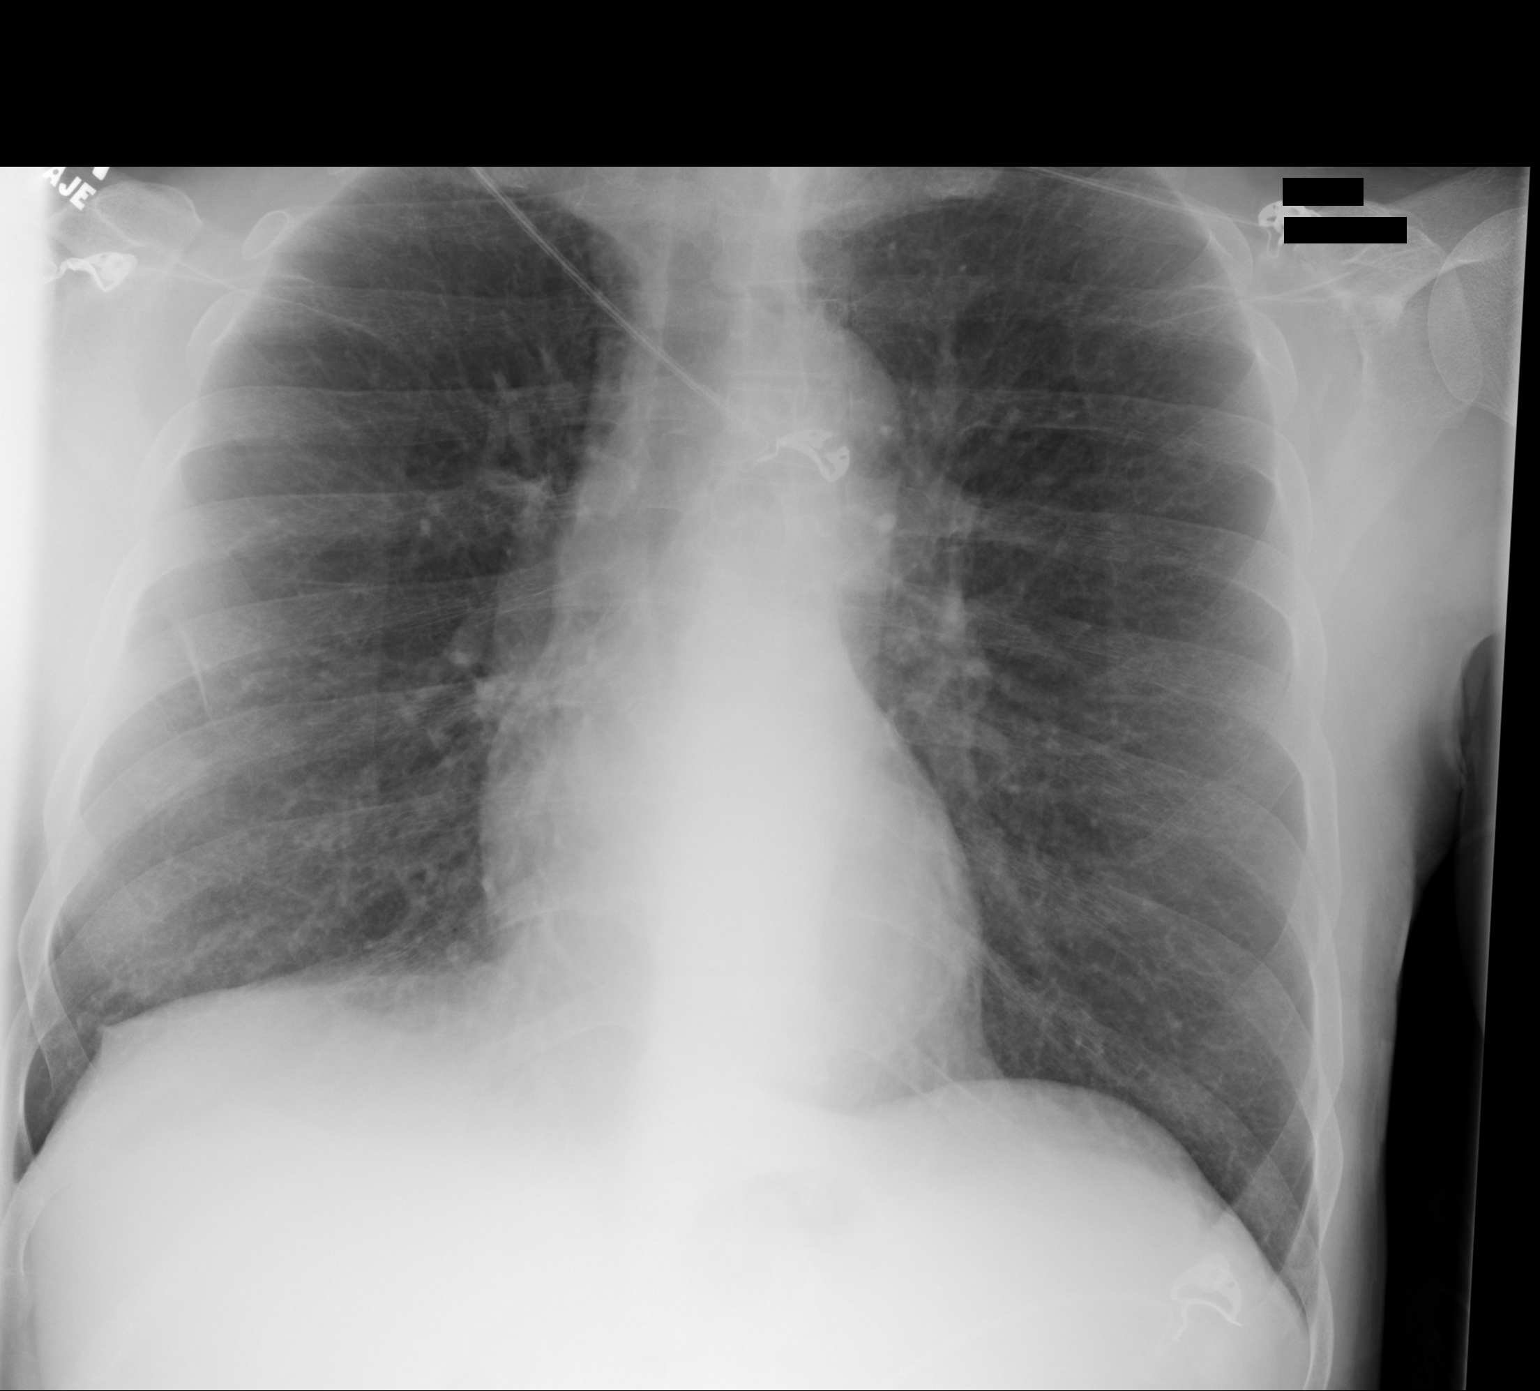

[1 of 1 positions shown; findings below may reference images not displayed]

FINDINGS: The heart size and mediastinal contours are within normal limits.
Both lungs are clear. The visualized skeletal structures are
unremarkable.
IMPRESSION: No active disease.

## 2017-08-11 IMAGING — CT CT PELVIS W/O CM
1 series · 15 of 32 positions shown, 19 images · non-contrast
Comparison: 11/06/2012

CLINICAL DATA: Probable urinary bladder stone

EXAM:
CT PELVIS WITHOUT CONTRAST
TECHNIQUE: Multidetector CT imaging of the pelvis was performed following the
standard protocol without intravenous contrast.

[Series 2: routine abd pel without · axial · non-contrast · 0.74mm/px · z∈[-378,-93]mm · 15 of 65 slices shown, 19 images]
[im 5/65  soft-tissue]
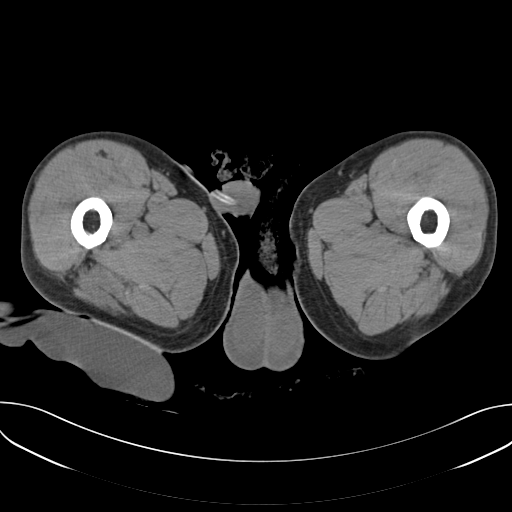
[im 5/65  bone]
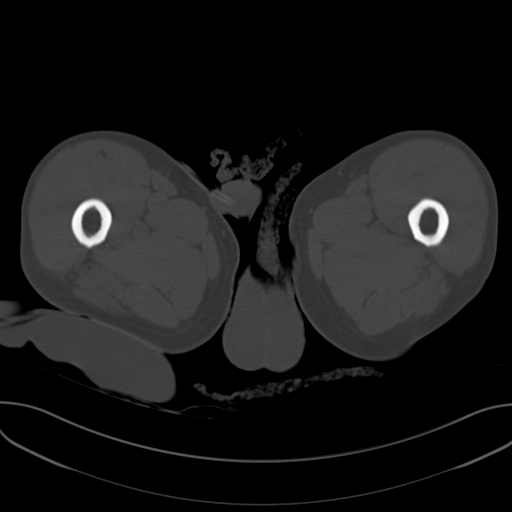
[im 9/65  soft-tissue]
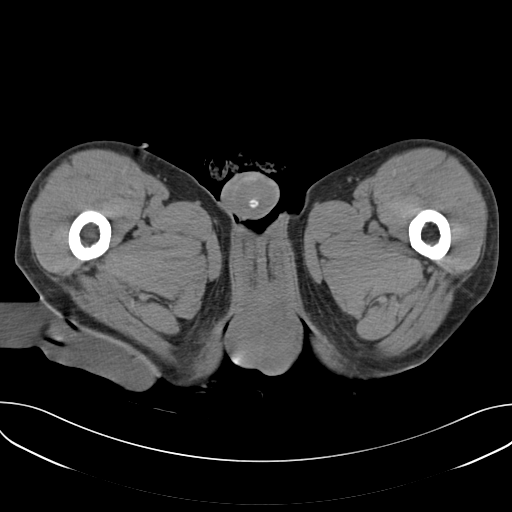
[im 13/65  soft-tissue]
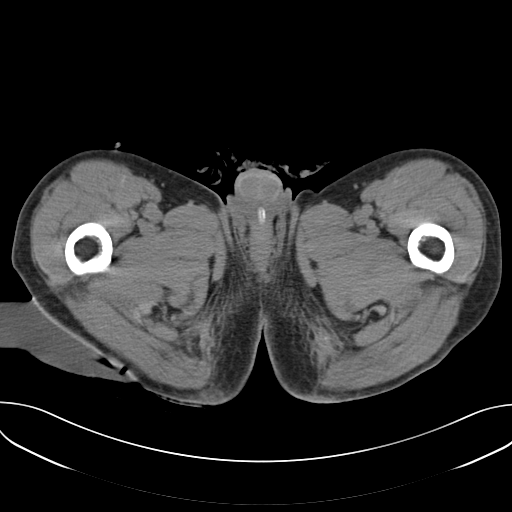
[im 19/65  soft-tissue]
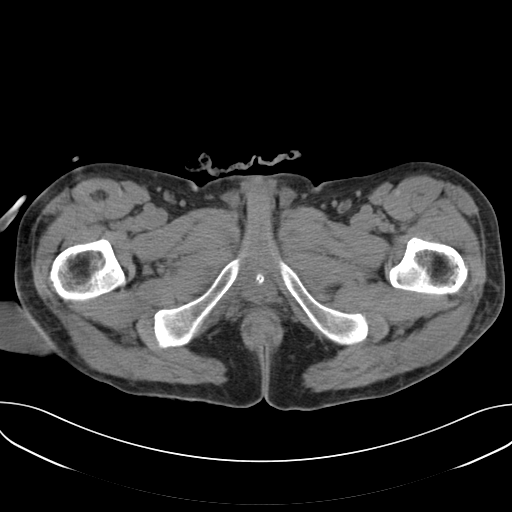
[im 23/65  soft-tissue]
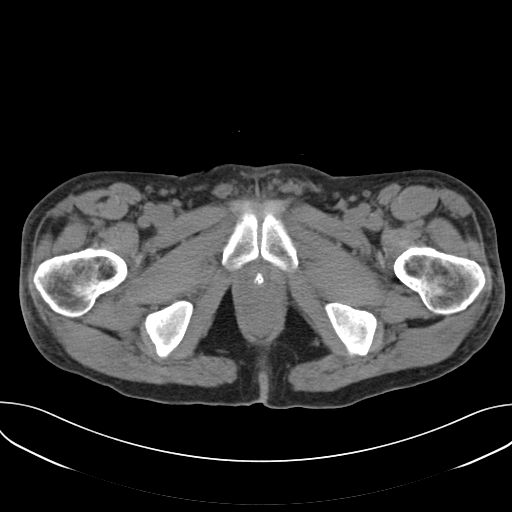
[im 27/65  soft-tissue]
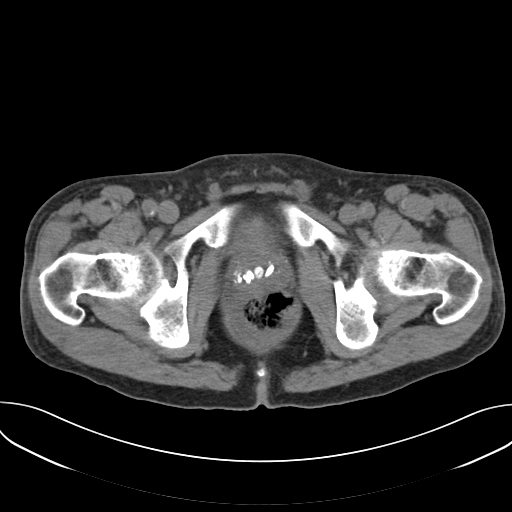
[im 34/65  soft-tissue]
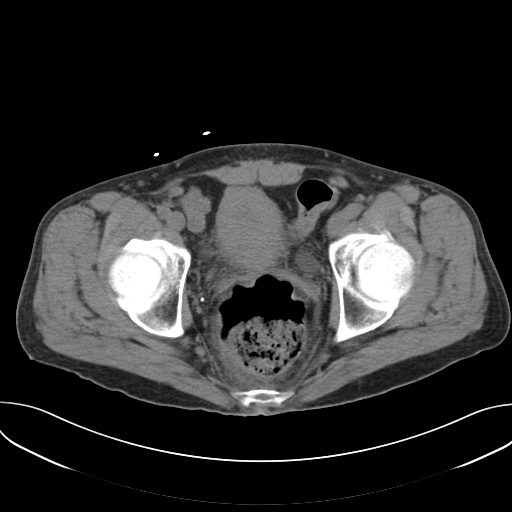
[im 38/65  soft-tissue]
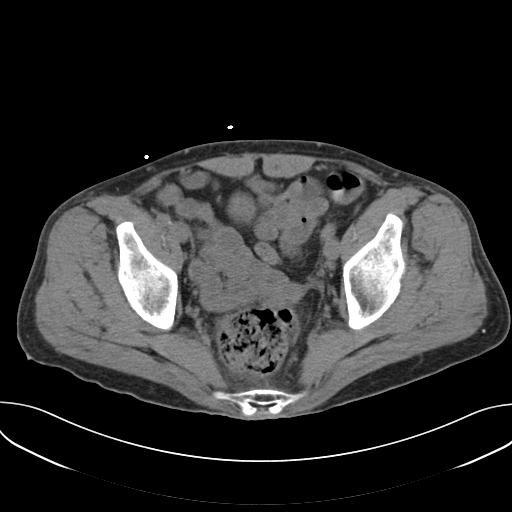
[im 42/65  soft-tissue]
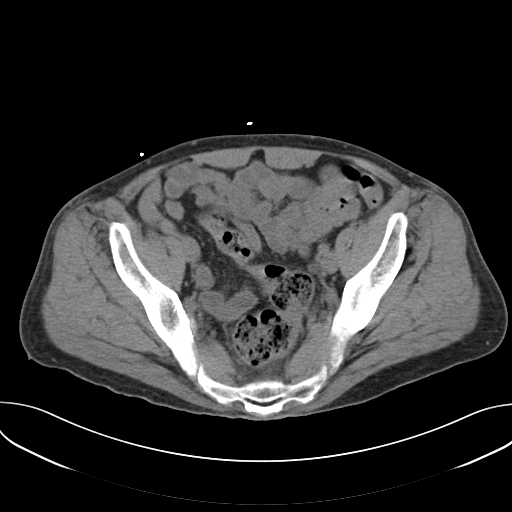
[im 42/65  bone]
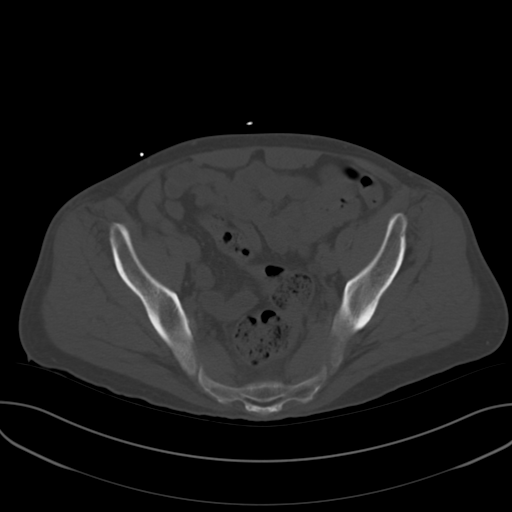
[im 46/65  soft-tissue]
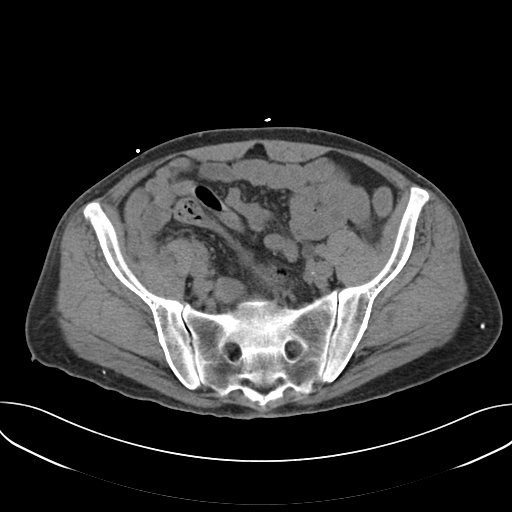
[im 52/65  soft-tissue]
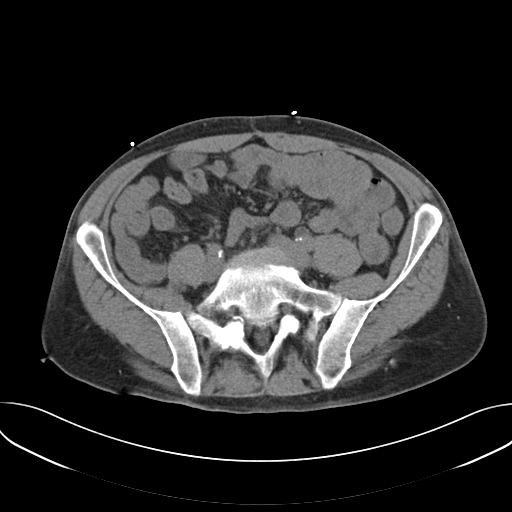
[im 56/65  soft-tissue]
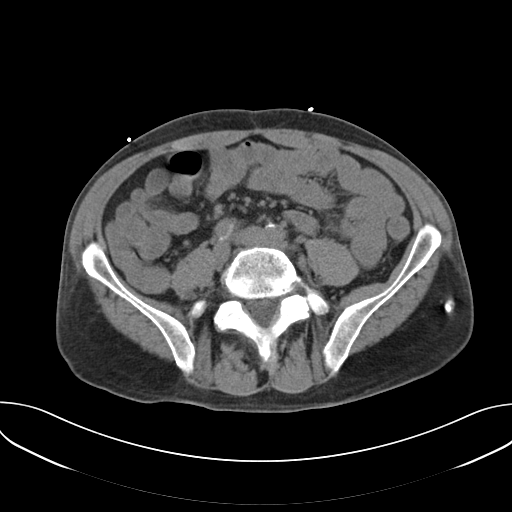
[im 56/65  lung]
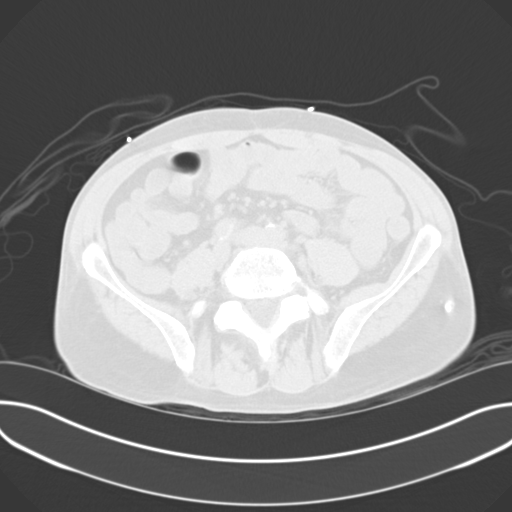
[im 58/65  lung]
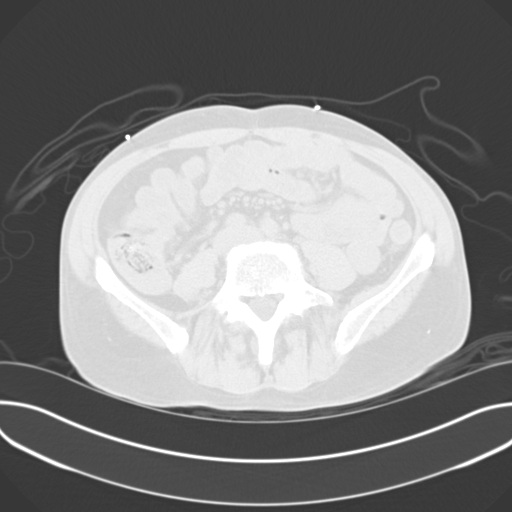
[im 60/65  soft-tissue]
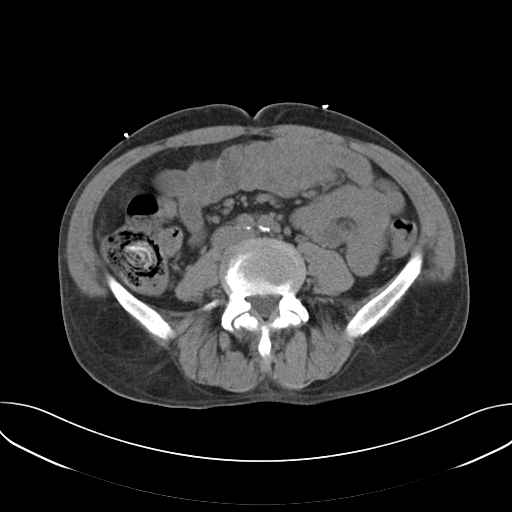
[im 60/65  lung]
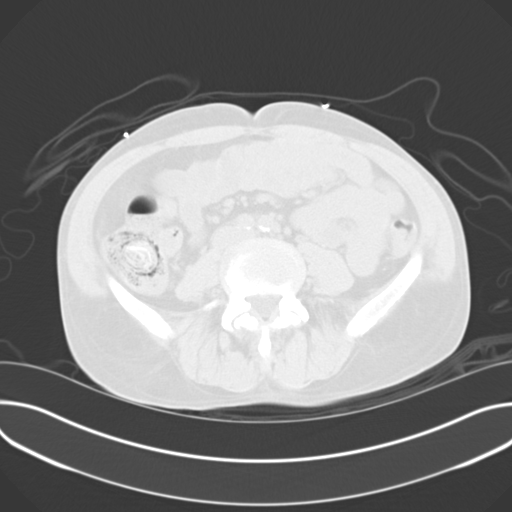
[im 62/65  lung]
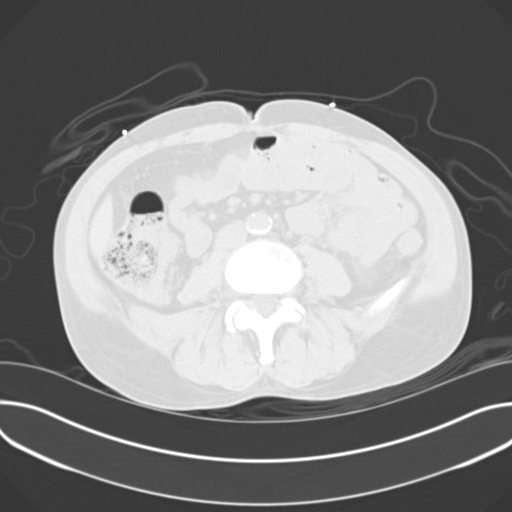

[15 of 32 positions shown; findings below may reference images not displayed]

FINDINGS: Degenerative changes are noted bilateral SI joints. Mild
degenerative changes lower lumbar spine. Atherosclerotic
calcifications of abdominal aorta and iliac arteries. No small bowel
obstruction. Abundant stool and gas noted within rectum which is
distended measures about 7.2 cm in diameter suspicious for mild
fecal impaction. No thickened or dilated small bowel loop. No
ascites or free air. There is a urinary bladder/penile catheter.
There is a calcified calculus within bladder at the tip of catheter
measures 6 mm. Significant thickening of urinary bladder wall highly
suspicious for cystitis. There is no inguinal adenopathy. Pelvic
phleboliths are again noted.
IMPRESSION: 1. There is urinary bladder catheter. There is a calcified bladder
calculus at the tip of catheter measures 6 mm.
2. Significant thickening of the wall of urinary bladder highly
suspicious for cystitis or chronic inflammation.
3. Abundant stool and gas noted within rectum measures 7.2 cm in
diameter. Highly suspicious for mild fecal impaction.
4. No inguinal adenopathy.

## 2017-08-11 IMAGING — CT CT HEAD W/O CM
4 of 9 series · 14 of 30 positions shown, 16 images · non-contrast
Comparison: 11/02/2012

CLINICAL DATA: Fall

EXAM:
CT HEAD WITHOUT CONTRAST
CT CERVICAL SPINE WITHOUT CONTRAST
TECHNIQUE: Multidetector CT imaging of the head and cervical spine was
performed following the standard protocol without intravenous
contrast. Multiplanar CT image reconstructions of the cervical spine
were also generated.

[Series 3: bone · axial · 0.42mm/px · z∈[-126,-92]mm · 2 of 51 slices shown (1 of 2)]
[im 17/51  bone]
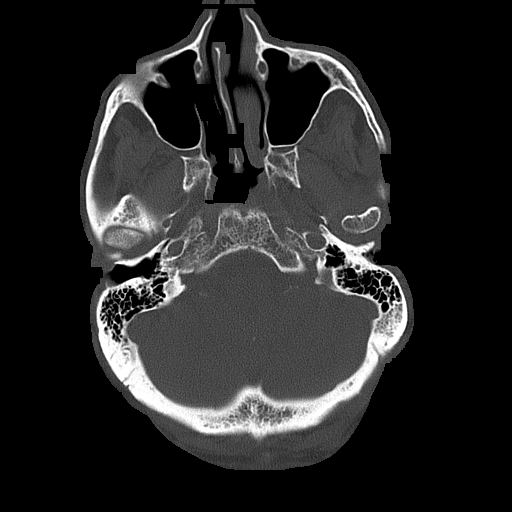
[im 34/51  bone]
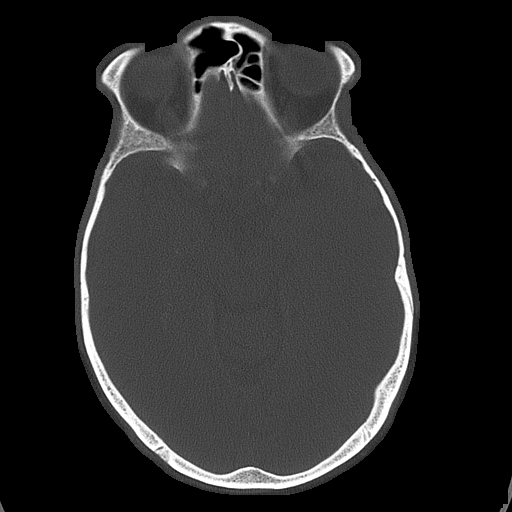

[Series 5: bone · axial · 0.42mm/px · z∈[-51,-15]mm · 2 of 55 slices shown (2 of 2)]
[im 19/55  bone]
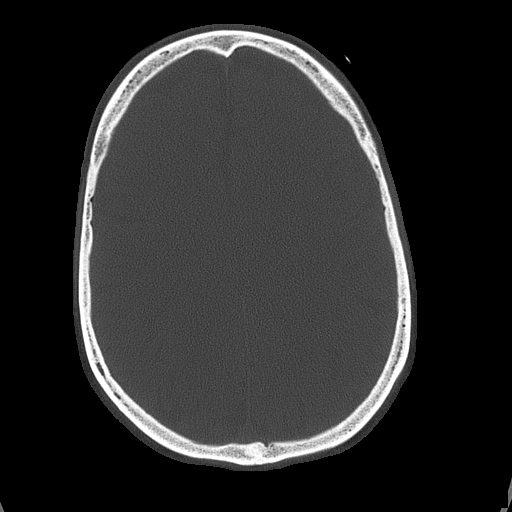
[im 37/55  bone]
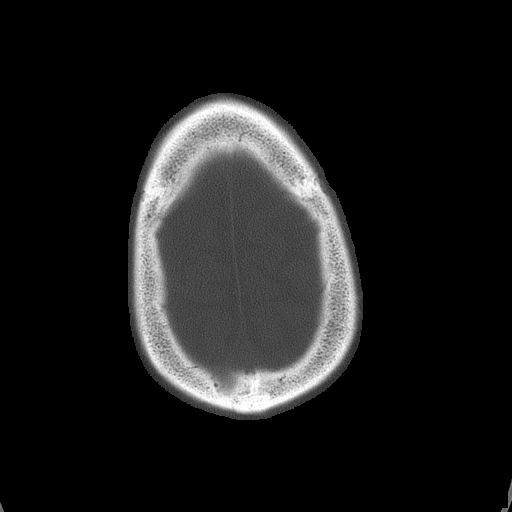

[Series 7: c spine soft · axial · 0.38mm/px · z∈[-250,-142]mm · 4 of 90 slices shown]
[im 18/90  brain]
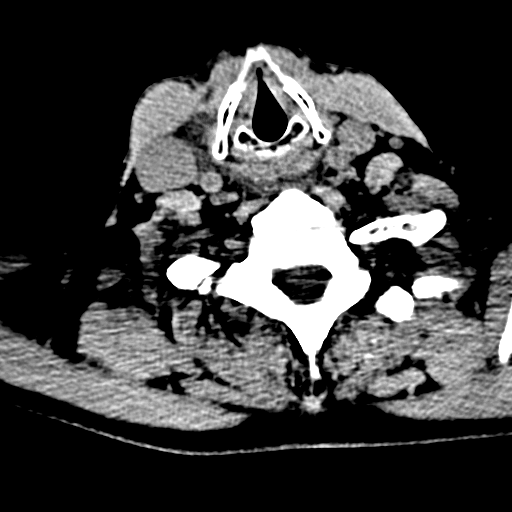
[im 36/90  brain]
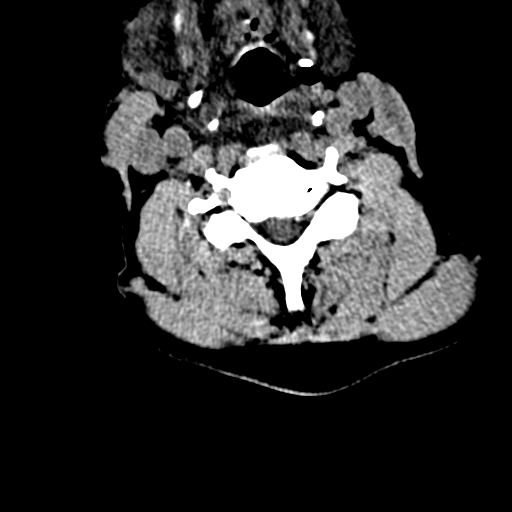
[im 54/90  brain]
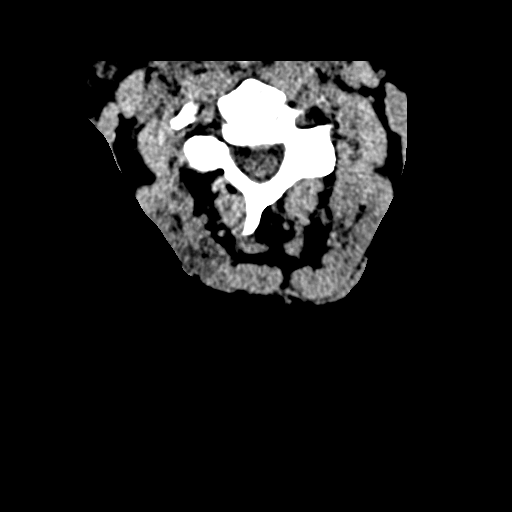
[im 72/90  brain]
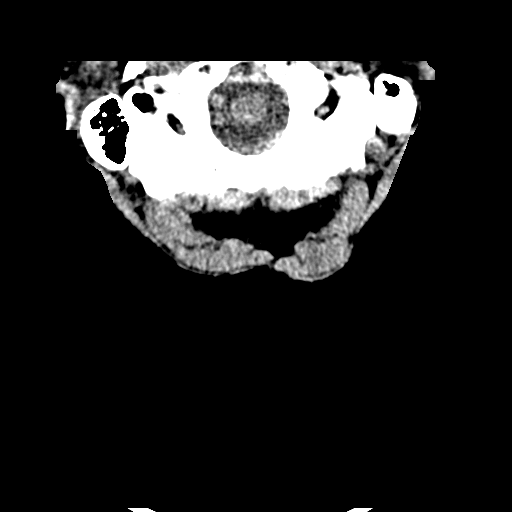

[Series 14: orthogonal axials · axial · 0.29mm/px · z∈[-321,-170]mm · 6 of 120 slices shown, 8 images]
[im 18/120  brain]
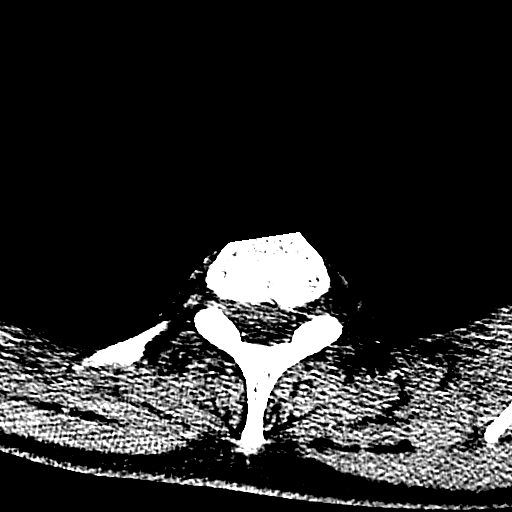
[im 18/120  bone]
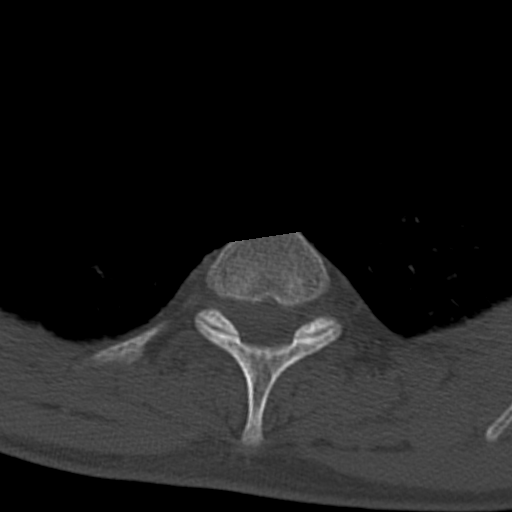
[im 35/120  brain]
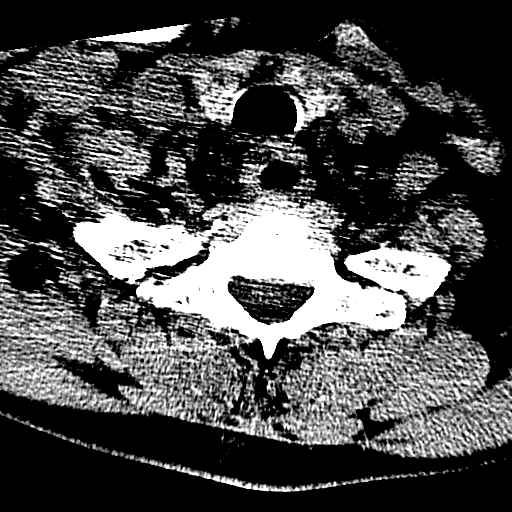
[im 52/120  brain]
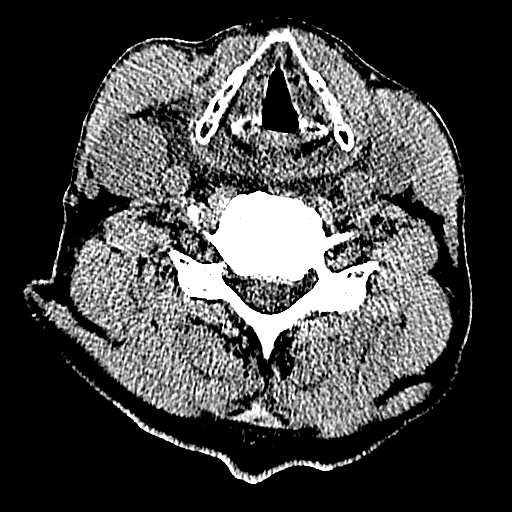
[im 69/120  brain]
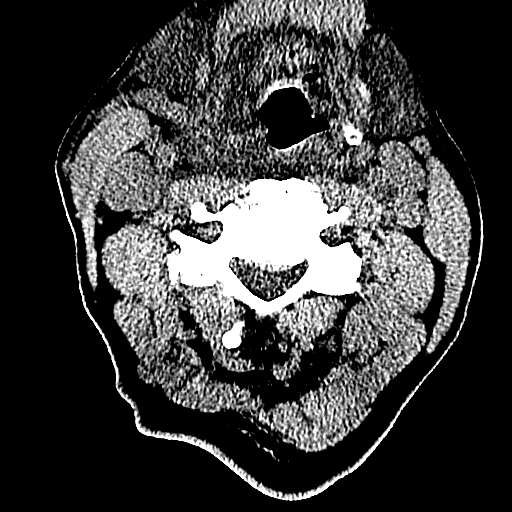
[im 86/120  brain]
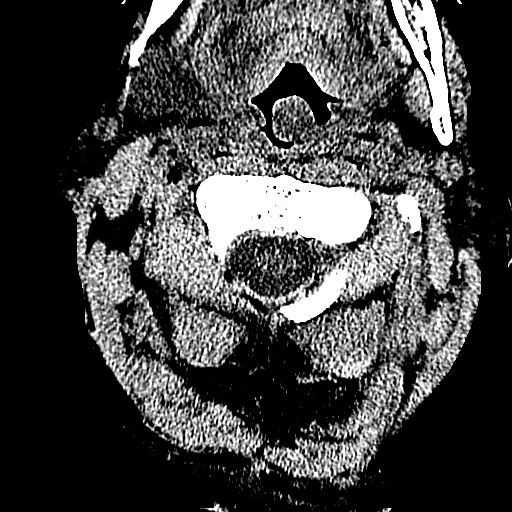
[im 86/120  bone]
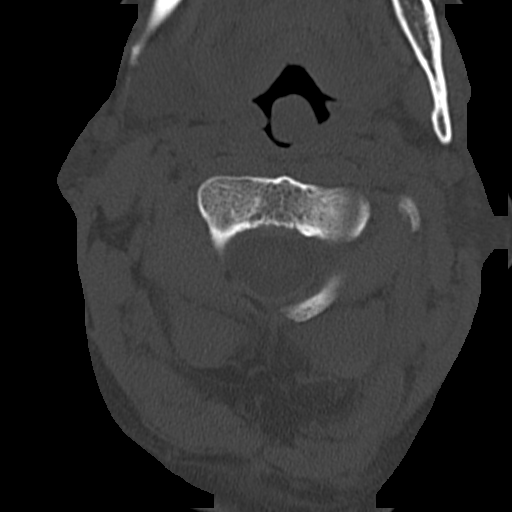
[im 103/120  brain]
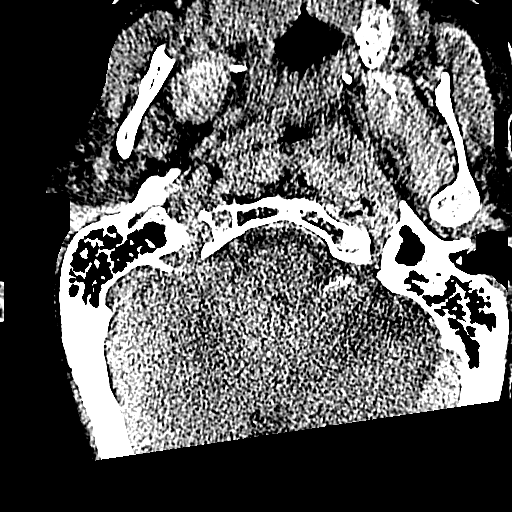

[14 of 30 positions shown; findings below may reference images not displayed]

FINDINGS: CT HEAD FINDINGS

No skull fracture is noted. No intracranial hemorrhage, mass effect
or midline shift. No acute cortical infarction. Paranasal sinuses
and mastoid air cells are unremarkable. No hydrocephalus. The gray
and white-matter differentiation is preserved. No mass lesion is
noted on this unenhanced scan. Ventricular size is stable from prior
exam.

CT CERVICAL SPINE FINDINGS

Axial images of the cervical spine shows no acute fracture or
subluxation. Computer processed images shows no acute fracture or
subluxation. Degenerative changes are noted C1-C2 articulation.
There is disc space flattening with endplate sclerotic changes mild
anterior and mild posterior spurring at C3-C4 and C5-C6 level. Disc
space flattening with mild anterior spurring at C6-C7 level. No
prevertebral soft tissue swelling. Cervical airway is patent. Mild
posterior spurring at C6-C7 level.

There is no pneumothorax in visualized lung apices. There is
probable congenital unfused posterior arch of C1 vertebral body.
Multilevel facet degenerative changes are noted.
IMPRESSION: 1. No acute intracranial abnormality.  No significant change.
2. No cervical spine acute fracture or subluxation. Degenerative
changes as described above.

## 2017-11-05 ENCOUNTER — Ambulatory Visit: Payer: Self-pay | Admitting: Podiatry

## 2017-11-29 IMAGING — CR DG CHEST 1V PORT
1 series · 1 of 1 positions shown · non-contrast
Comparison: January 17, 2015

CLINICAL DATA: Central catheter placement

EXAM:
PORTABLE CHEST 1 VIEW

[ap]
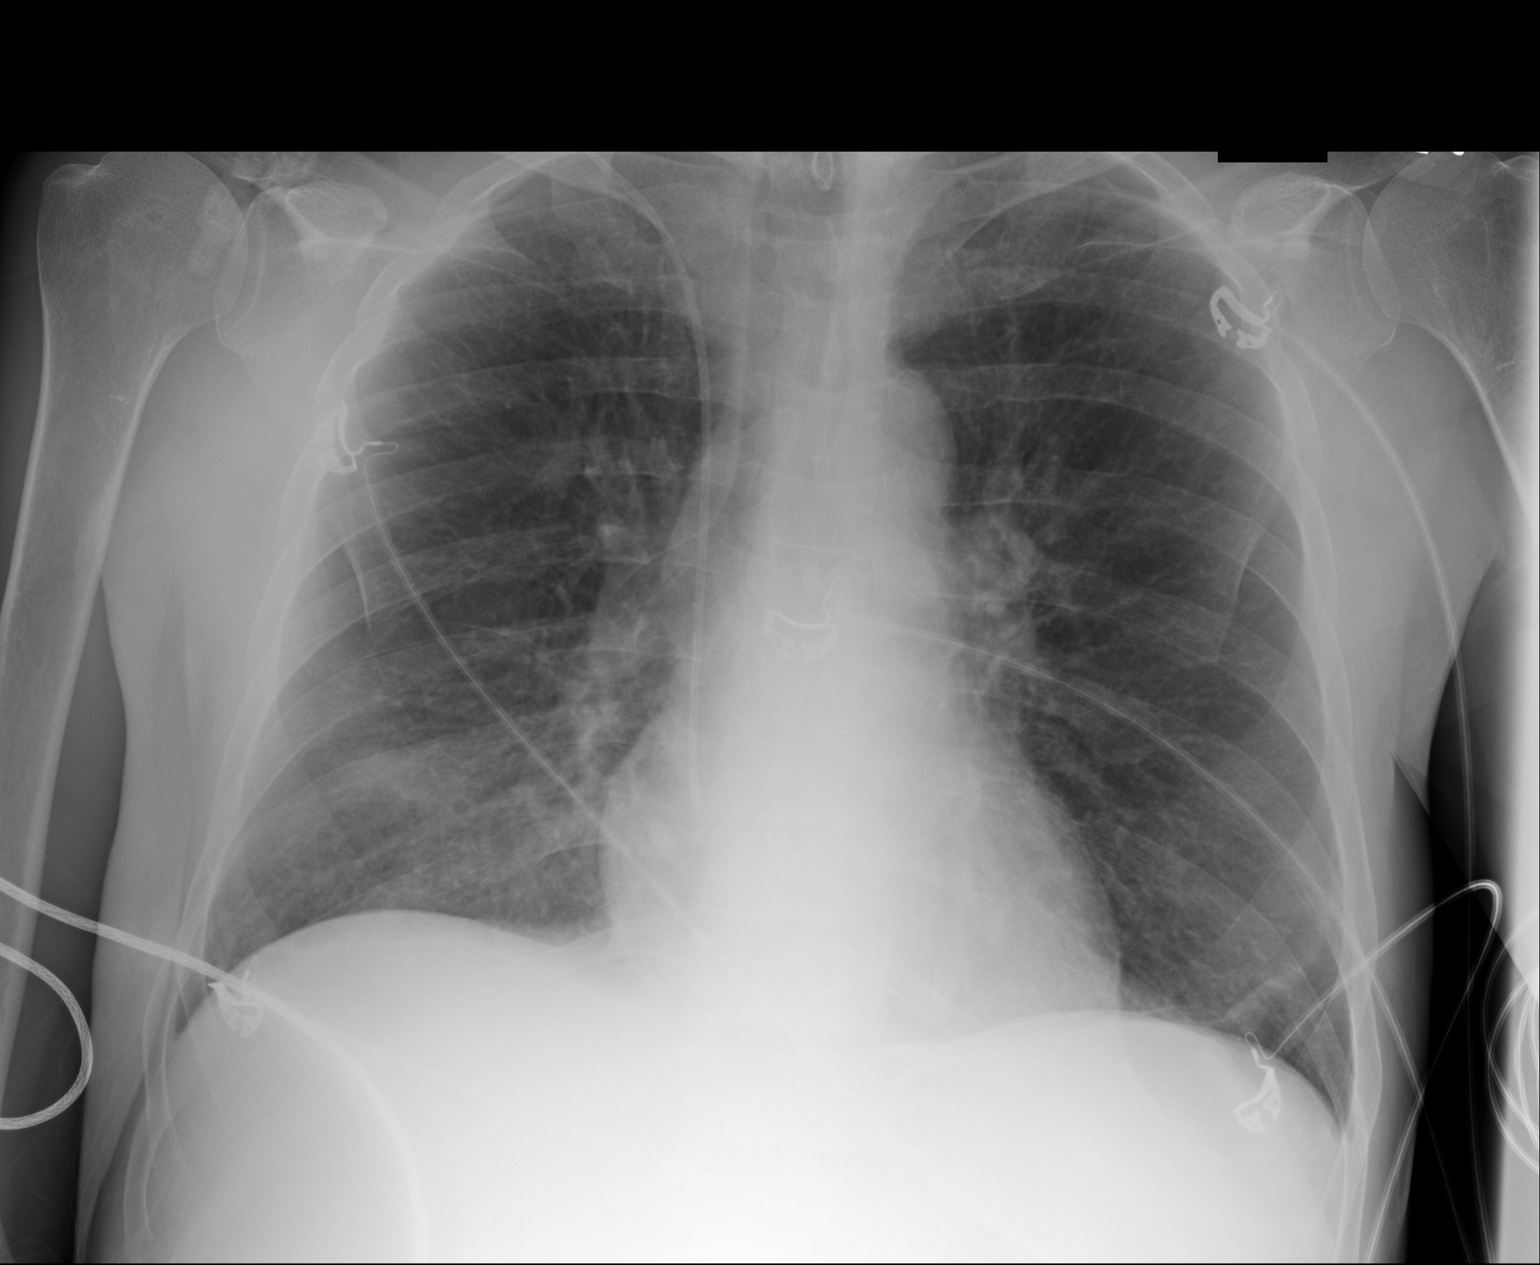

[1 of 1 positions shown; findings below may reference images not displayed]

FINDINGS: Central catheter tip is at the cavoatrial junction. No pneumothorax.
There is a small area of infiltrate in the right lower lobe. Lungs
elsewhere clear. Heart size and pulmonary vascularity are normal. No
adenopathy. No bone lesions.
IMPRESSION: Central catheter tip at cavoatrial junction. No pneumothorax. Small
area of of infiltrate right base.

## 2017-11-30 IMAGING — CR DG CHEST 1V PORT
1 series · 1 of 1 positions shown · non-contrast
Comparison: May 07, 2015.

CLINICAL DATA: Status post central line placement.

EXAM:
PORTABLE CHEST 1 VIEW

[ap]
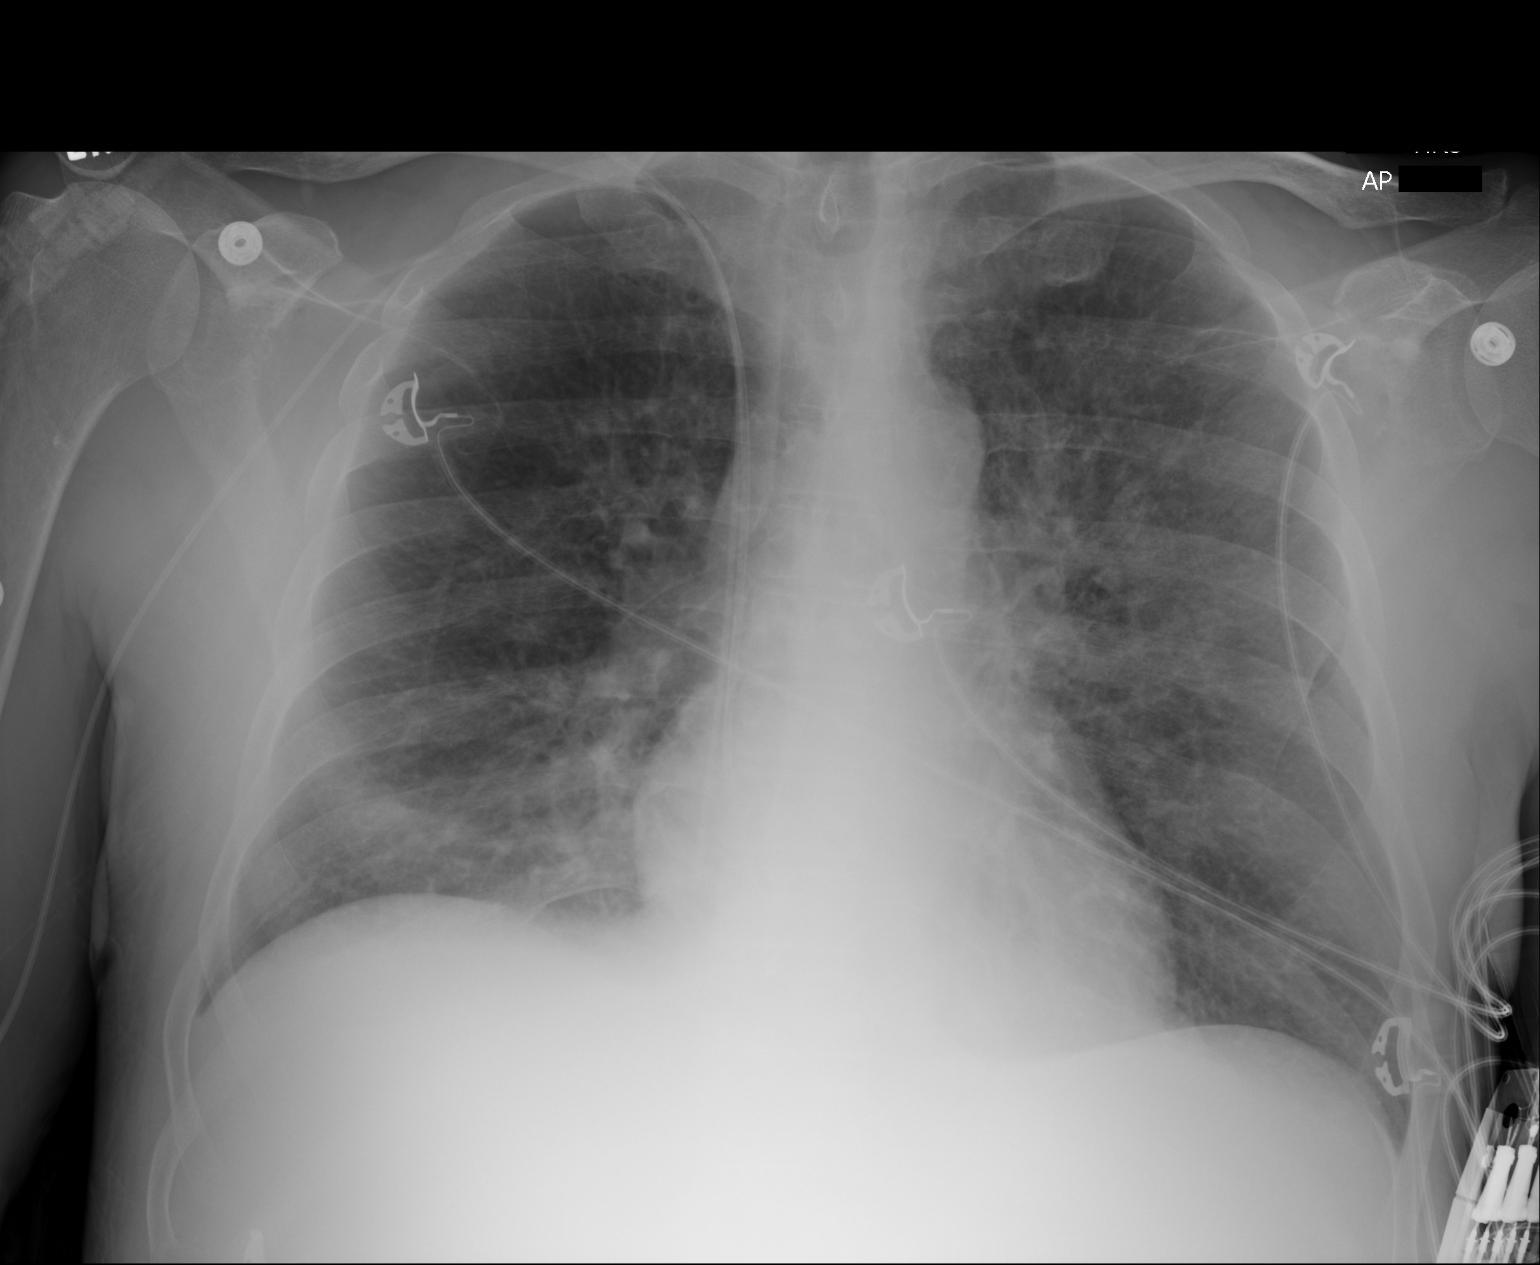

[1 of 1 positions shown; findings below may reference images not displayed]

FINDINGS: The heart size and mediastinal contours are within normal limits. No
pneumothorax or pleural effusion is noted. Increased central
pulmonary vascular congestion is noted with probable bilateral
perihilar edema. Stable right basilar opacity is noted concerning
for pneumonia or atelectasis. Right internal jugular catheter is
unchanged in position with distal tip in the expected position the
cavoatrial junction. There is been interval placement of right-sided
PICC line with distal tip in the right atrium. The visualized
skeletal structures are unremarkable.
IMPRESSION: Increased central pulmonary vascular congestion is noted with
probable bilateral perihilar edema.

Stable right basilar opacity is noted concerning for pneumonia or
subsegmental atelectasis.

Interval placement of right-sided PICC line with distal tip in the
expected position of the right atrium. Withdrawal by 3-4 cm is
recommended. These results will be called to the ordering clinician
or representative by the Radiologist Assistant, and communication
documented in the PACS or zVision Dashboard.

## 2018-05-11 IMAGING — CR DG CHEST 2V
1 series · 2 of 2 positions shown · non-contrast
Comparison: 05/08/2015

CLINICAL DATA: Recent fall and man hole with left-sided chest pain,
initial encounter

EXAM:
CHEST  2 VIEW

[Series 1: dg chest 2 view · 0.14mm/px · 2 of 2 slices shown]
[im 1/2]
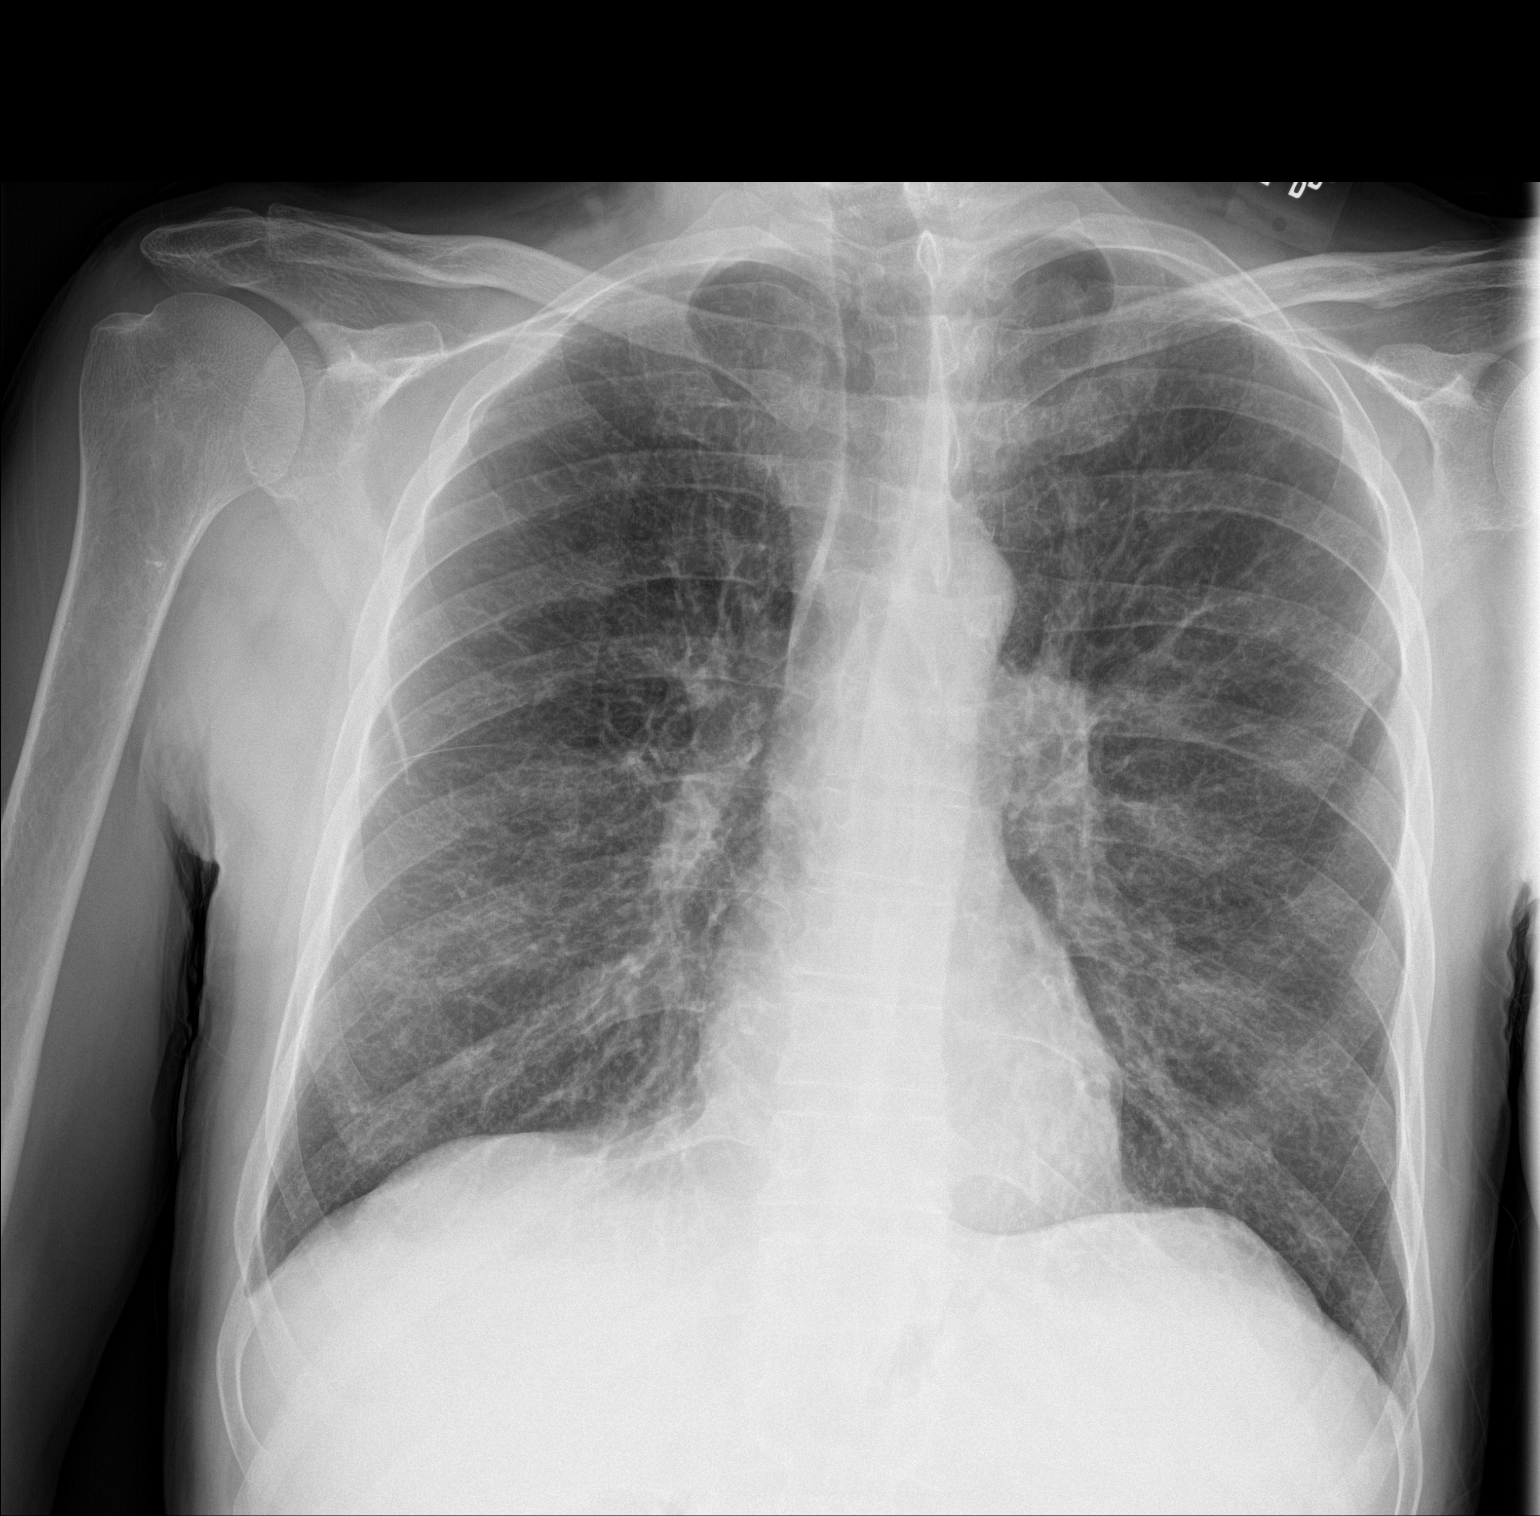
[im 2/2]
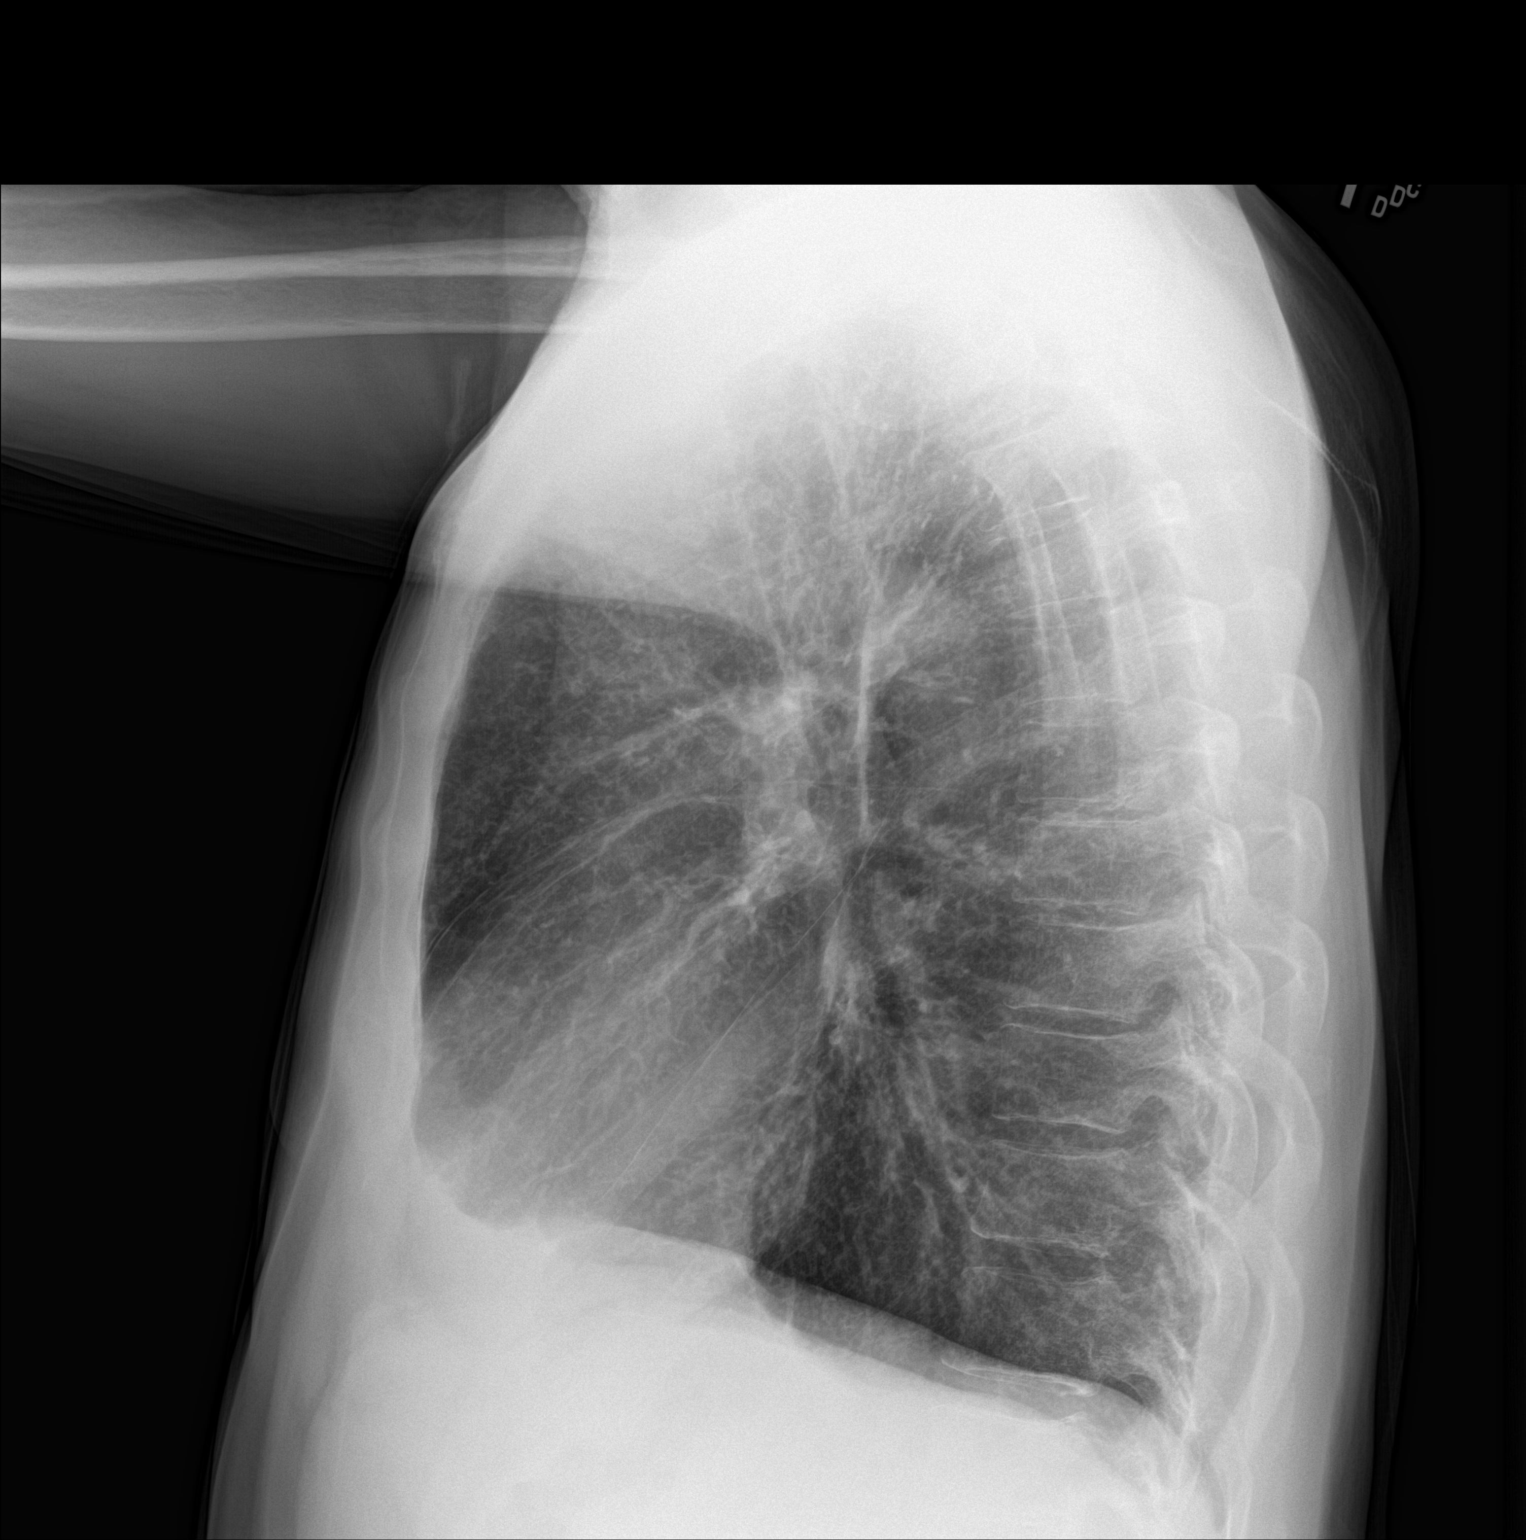

[2 of 2 positions shown; findings below may reference images not displayed]

FINDINGS: COPD without acute abnormality.
IMPRESSION: No active cardiopulmonary disease.

## 2018-08-20 ENCOUNTER — Other Ambulatory Visit: Payer: Self-pay

## 2018-08-20 ENCOUNTER — Encounter: Payer: Self-pay | Admitting: Emergency Medicine

## 2018-08-20 ENCOUNTER — Emergency Department: Payer: Medicare Other

## 2018-08-20 ENCOUNTER — Observation Stay
Admission: EM | Admit: 2018-08-20 | Discharge: 2018-08-22 | Disposition: A | Discharge status: Nursing Home (Includes ICF) | Payer: Medicare Other | Attending: Internal Medicine | Admitting: Internal Medicine

## 2018-08-20 DIAGNOSIS — Z888 Allergy status to other drugs, medicaments and biological substances status: Secondary | ICD-10-CM | POA: Insufficient documentation

## 2018-08-20 DIAGNOSIS — Z8711 Personal history of peptic ulcer disease: Secondary | ICD-10-CM | POA: Insufficient documentation

## 2018-08-20 DIAGNOSIS — Z79890 Hormone replacement therapy (postmenopausal): Secondary | ICD-10-CM | POA: Insufficient documentation

## 2018-08-20 DIAGNOSIS — F329 Major depressive disorder, single episode, unspecified: Secondary | ICD-10-CM | POA: Insufficient documentation

## 2018-08-20 DIAGNOSIS — N390 Urinary tract infection, site not specified: Secondary | ICD-10-CM | POA: Insufficient documentation

## 2018-08-20 DIAGNOSIS — J449 Chronic obstructive pulmonary disease, unspecified: Secondary | ICD-10-CM | POA: Insufficient documentation

## 2018-08-20 DIAGNOSIS — Z79899 Other long term (current) drug therapy: Secondary | ICD-10-CM | POA: Diagnosis not present

## 2018-08-20 DIAGNOSIS — Z8719 Personal history of other diseases of the digestive system: Secondary | ICD-10-CM | POA: Insufficient documentation

## 2018-08-20 DIAGNOSIS — F200 Paranoid schizophrenia: Secondary | ICD-10-CM | POA: Insufficient documentation

## 2018-08-20 DIAGNOSIS — K219 Gastro-esophageal reflux disease without esophagitis: Secondary | ICD-10-CM | POA: Diagnosis not present

## 2018-08-20 DIAGNOSIS — R4182 Altered mental status, unspecified: Secondary | ICD-10-CM | POA: Insufficient documentation

## 2018-08-20 DIAGNOSIS — Z7951 Long term (current) use of inhaled steroids: Secondary | ICD-10-CM | POA: Diagnosis not present

## 2018-08-20 DIAGNOSIS — R319 Hematuria, unspecified: Secondary | ICD-10-CM

## 2018-08-20 DIAGNOSIS — E039 Hypothyroidism, unspecified: Secondary | ICD-10-CM | POA: Diagnosis not present

## 2018-08-20 DIAGNOSIS — Z885 Allergy status to narcotic agent status: Secondary | ICD-10-CM | POA: Insufficient documentation

## 2018-08-20 DIAGNOSIS — N200 Calculus of kidney: Secondary | ICD-10-CM | POA: Insufficient documentation

## 2018-08-20 DIAGNOSIS — E162 Hypoglycemia, unspecified: Secondary | ICD-10-CM | POA: Diagnosis not present

## 2018-08-20 DIAGNOSIS — Z951 Presence of aortocoronary bypass graft: Secondary | ICD-10-CM | POA: Diagnosis not present

## 2018-08-20 DIAGNOSIS — F17210 Nicotine dependence, cigarettes, uncomplicated: Secondary | ICD-10-CM | POA: Insufficient documentation

## 2018-08-20 LAB — GLUCOSE, CAPILLARY
GLUCOSE-CAPILLARY: 91 mg/dL (ref 70–99)
GLUCOSE-CAPILLARY: 242 mg/dL — AB (ref 70–99)
Glucose-Capillary: 17 mg/dL — CL (ref 70–99)
Glucose-Capillary: 104 mg/dL — ABNORMAL HIGH (ref 70–99)
Glucose-Capillary: 48 mg/dL — ABNORMAL LOW (ref 70–99)
Glucose-Capillary: 354 mg/dL — ABNORMAL HIGH (ref 70–99)
Glucose-Capillary: 95 mg/dL (ref 70–99)
Glucose-Capillary: 81 mg/dL (ref 70–99)
Glucose-Capillary: 105 mg/dL — ABNORMAL HIGH (ref 70–99)
Glucose-Capillary: 62 mg/dL — ABNORMAL LOW (ref 70–99)
Glucose-Capillary: 76 mg/dL (ref 70–99)

## 2018-08-20 LAB — URINALYSIS, COMPLETE (UACMP) WITH MICROSCOPIC
BILIRUBIN URINE: NEGATIVE
Glucose, UA: NEGATIVE mg/dL
KETONES UR: NEGATIVE mg/dL
Nitrite: POSITIVE — AB
PH: 7 (ref 5.0–8.0)
PROTEIN: NEGATIVE mg/dL
RBC / HPF: >50 RBC/hpf — ABNORMAL HIGH (ref 0–5)
Specific Gravity, Urine: 1.011 (ref 1.005–1.030)

## 2018-08-20 LAB — CBC WITH DIFFERENTIAL/PLATELET
Abs Immature Granulocytes: 0.24 10*3/uL — ABNORMAL HIGH (ref 0.00–0.07)
BASOS PCT: 0 %
Basophils Absolute: 0 10*3/uL (ref 0.0–0.1)
EOS PCT: 0 %
Eosinophils Absolute: 0 10*3/uL (ref 0.0–0.5)
HEMATOCRIT: 39.3 % (ref 39.0–52.0)
Hemoglobin: 12.9 g/dL — ABNORMAL LOW (ref 13.0–17.0)
Immature Granulocytes: 1 %
Lymphocytes Relative: 5 %
Lymphs Abs: 0.8 10*3/uL (ref 0.7–4.0)
MCH: 31.1 pg (ref 26.0–34.0)
MCHC: 32.8 g/dL (ref 30.0–36.0)
MCV: 94.7 fL (ref 80.0–100.0)
Monocytes Absolute: 0.7 10*3/uL (ref 0.1–1.0)
Monocytes Relative: 4 %
NEUTROS PCT: 90 %
Neutro Abs: 14.9 10*3/uL — ABNORMAL HIGH (ref 1.7–7.7)
PLATELETS: 130 10*3/uL — AB (ref 150–400)
RBC: 4.15 MIL/uL — ABNORMAL LOW (ref 4.22–5.81)
RDW: 14.1 % (ref 11.5–15.5)
WBC: 16.6 10*3/uL — AB (ref 4.0–10.5)
nRBC: 0 % (ref 0.0–0.2)

## 2018-08-20 LAB — BASIC METABOLIC PANEL
Anion gap: 6 (ref 5–15)
BUN: 31 mg/dL — ABNORMAL HIGH (ref 8–23)
CALCIUM: 8.8 mg/dL — AB (ref 8.9–10.3)
CO2: 22 mmol/L (ref 22–32)
CREATININE: 1.39 mg/dL — AB (ref 0.61–1.24)
Chloride: 109 mmol/L (ref 98–111)
GFR, EST NON AFRICAN AMERICAN: 53 mL/min — AB (ref >60)
Glucose, Bld: 82 mg/dL (ref 70–99)
Potassium: 4 mmol/L (ref 3.5–5.1)
SODIUM: 137 mmol/L (ref 135–145)

## 2018-08-20 LAB — BETA-HYDROXYBUTYRIC ACID: Beta-Hydroxybutyric Acid: 0.1 mmol/L (ref 0.05–0.27)

## 2018-08-20 LAB — LACTIC ACID, PLASMA
Lactic Acid, Venous: 1.1 mmol/L (ref 0.5–1.9)
Lactic Acid, Venous: 1.5 mmol/L (ref 0.5–1.9)

## 2018-08-20 LAB — TSH: TSH: 4.315 u[IU]/mL (ref 0.350–4.500)

## 2018-08-20 MED ORDER — CEPHALEXIN 500 MG PO CAPS
500.0000 mg | ORAL_CAPSULE | Freq: Two times a day (BID) | ORAL | Status: DC
  Administered 2018-08-21 – 2018-08-22 (×3): 500 mg via ORAL
  Filled 2018-08-20 (×3): qty 1

## 2018-08-20 MED ORDER — SENNOSIDES-DOCUSATE SODIUM 8.6-50 MG PO TABS
1.0000 | ORAL_TABLET | Freq: Every evening | ORAL | Status: DC | PRN
  Administered 2018-08-21: 20:00:00 1 via ORAL
  Filled 2018-08-20: qty 1

## 2018-08-20 MED ORDER — SODIUM CHLORIDE 0.9 % IV SOLN
1.0000 g | Freq: Once | INTRAVENOUS | Status: AC
  Administered 2018-08-20: 1 g via INTRAVENOUS
  Filled 2018-08-20: qty 10

## 2018-08-20 MED ORDER — DEXTROSE 50 % IV SOLN
25.0000 g | Freq: Once | INTRAVENOUS | Status: AC
  Administered 2018-08-20: 25 g via INTRAVENOUS

## 2018-08-20 MED ORDER — ONDANSETRON HCL 4 MG/2ML IJ SOLN: 4.0000 mg | Freq: Four times a day (QID) | INTRAMUSCULAR | Status: DC | PRN

## 2018-08-20 MED ORDER — CEPHALEXIN 500 MG PO CAPS: 500.0000 mg | ORAL_CAPSULE | Freq: Two times a day (BID) | ORAL | Status: DC

## 2018-08-20 MED ORDER — DEXTROSE 10 % IV SOLN
INTRAVENOUS | Status: DC
  Administered 2018-08-20 – 2018-08-21 (×2): via INTRAVENOUS

## 2018-08-20 MED ORDER — ONDANSETRON HCL 4 MG PO TABS: 4.0000 mg | ORAL_TABLET | Freq: Four times a day (QID) | ORAL | Status: DC | PRN

## 2018-08-20 MED ORDER — ACETAMINOPHEN 650 MG RE SUPP: 650.0000 mg | Freq: Four times a day (QID) | RECTAL | Status: DC | PRN

## 2018-08-20 MED ORDER — ENOXAPARIN SODIUM 40 MG/0.4ML ~~LOC~~ SOLN
40.0000 mg | SUBCUTANEOUS | Status: DC
  Administered 2018-08-20 – 2018-08-21 (×2): 40 mg via SUBCUTANEOUS
  Filled 2018-08-20 (×2): qty 0.4

## 2018-08-20 MED ORDER — ACETAMINOPHEN 325 MG PO TABS
650.0000 mg | ORAL_TABLET | Freq: Four times a day (QID) | ORAL | Status: DC | PRN
  Administered 2018-08-20 – 2018-08-21 (×3): 650 mg via ORAL
  Filled 2018-08-20 (×3): qty 2

## 2018-08-20 MED ORDER — DEXTROSE 50 % IV SOLN
INTRAVENOUS | Status: AC
  Administered 2018-08-20: 25 g via INTRAVENOUS
  Filled 2018-08-20: qty 50

## 2018-08-20 NOTE — ED Triage Notes (Signed)
PT to ER via EMS from his care home.  Pt was noted to have CBG of 29 per first responder.  Pt has received 500cc D10 and recheck for EMS was 156.  Per EMS pt had original IV to right AC and 500cc D10 infused followed by swelling to upper arm.  Possible infiltration,Dr. Malinda aware of same, will monitor.

## 2018-08-20 NOTE — Progress Notes (Signed)
Family Meeting Note  Advance Directive:no Today a meeting took place with the patient patient comes in from family group home with altered mental status. He was found to have sugar of 17. Received couple rounds of IV dextrose and currently on IV dextrose drip. He has multiple other medical problems and noted on urine analysis. Discuss code status patient wishes to be full code. No family in the ER. Time spent during discussion:16 mins Fritzi Mandes, MD

## 2018-08-20 NOTE — ED Notes (Signed)
Report received from [place], RN, care of pt assumed, pt voices no needs or c/o at this time.  Will continue to monitor.

## 2018-08-20 NOTE — H&P (Signed)
[facility] [place] NAME: Alan Small    MR#:  [id number]  DATE OF BIRTH:  May 21, 1956  DATE OF ADMISSION:  08/20/2018  PRIMARY CARE PHYSICIAN: [name], MD   REQUESTING/REFERRING PHYSICIAN: Dr Cinda Quest  CHIEF COMPLAINT:  patient brought in with altered mental status  HISTORY OF PRESENT ILLNESS:  Alan Small  is a 63 y.o. male with a known history of adult schizophrenia, gastric ulcer, Gerd, depression, COPD X smoker, hypothyroidism comes to the emergency room from family care home. 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 He received 500 mL of D10. Sugar came up to 156. Patient currently is awake alert. He states he is been eating okay. No recent fever nausea vomiting or any other illness prohibiting him from eating.  No family in the ER. Patient is being admitted for hyperglycemia. He is currently on D10 drip. Most recent sugar was 48.  PAST MEDICAL HISTORY:   Past Medical History:  Diagnosis Date  . Asthma   . COPD (chronic obstructive pulmonary disease) ([place])   . Depression   . External hemorrhoids   . Gastric ulcer   . GERD (gastroesophageal reflux disease)   . Hydronephrosis   . Hypothyroidism    hypo  . Insomnia   . Mental disorder   . Myocardial infarction ([place])   . Paranoid schizophrenia ([place])   . Renal disorder    renal failure  . Seizures ([place])   . Stroke [facility])     PAST SURGICAL HISTOIRY:   Past Surgical History:  Procedure Laterality Date  . APPENDECTOMY    . COLONOSCOPY WITH PROPOFOL N/A 12/07/2014   Procedure: COLONOSCOPY WITH PROPOFOL;  Surgeon: Manya Silvas, MD;  Location: [facility] ENDOSCOPY;  Service: Endoscopy;  Laterality: N/A;  . COLONOSCOPY WITH PROPOFOL N/A 10/10/2016   Procedure: COLONOSCOPY WITH PROPOFOL;  Surgeon: Lucilla Lame, MD;  Location: [place];  Service: Endoscopy;  Laterality: N/A;  . ESOPHAGOGASTRODUODENOSCOPY N/A 12/07/2014   Procedure:  ESOPHAGOGASTRODUODENOSCOPY (EGD);  Surgeon: Manya Silvas, MD;  Location: [facility] ENDOSCOPY;  Service: Endoscopy;  Laterality: N/A;  . NEPHRECTOMY    . POLYPECTOMY  10/10/2016   Procedure: POLYPECTOMY;  Surgeon: Lucilla Lame, MD;  Location: [place];  Service: Endoscopy;;  . SUPRAPUBIC CATHETER INSERTION      SOCIAL HISTORY:   Social History   Tobacco Use  . Smoking status: Current Every Day Smoker    Packs/day: 1.00    Years: 55.00    Pack years: 55.00    Types: Cigarettes  . Smokeless tobacco: Former Systems developer    Types: Chew  Substance Use Topics  . Alcohol use: No    FAMILY HISTORY:   Family History  Problem Relation Age of Onset  . Stroke Father   . Pneumonia Father   . Brain cancer Mother     DRUG ALLERGIES:   Allergies  Allergen Reactions  . Mellaril [Thioridazine] Shortness Of Breath and Swelling  . Buprenorphine Itching  . Morphine And Related Itching  . Navane [Thiothixene] Other (See Comments)    Reaction:  GI upset   . Thorazine [Chlorpromazine] Other (See Comments)    Reaction:  Dizziness/fainting     REVIEW OF SYSTEMS:  Review of Systems  Constitutional: Negative for chills, fever and weight loss.  HENT: Negative for ear discharge, ear pain and nosebleeds.   Eyes: Negative for blurred vision, pain and discharge.  Respiratory: Negative for sputum production, shortness of  breath, wheezing and stridor.   Cardiovascular: Negative for chest pain, palpitations, orthopnea and PND.  Gastrointestinal: Negative for abdominal pain, diarrhea, nausea and vomiting.  Genitourinary: Negative for frequency and urgency.  Musculoskeletal: Negative for back pain and joint pain.  Neurological: Negative for sensory change, speech change, focal weakness and weakness.  Psychiatric/Behavioral: Negative for depression and hallucinations. The patient is not nervous/anxious.      MEDICATIONS AT HOME:   Prior to Admission medications   Medication Sig Start Date End  Date Taking? Authorizing Provider  tamsulosin (FLOMAX) 0.4 MG CAPS capsule Take 0.4 mg by mouth every morning.    Yes [provider]  acetaminophen (TYLENOL) 500 MG tablet Take 1,000 mg by mouth every 6 (six) hours as needed for mild pain, moderate pain or fever.    [provider]  albuterol-ipratropium (COMBIVENT) 18-103 MCG/ACT inhaler Inhale 1 puff into the lungs every 6 (six) hours as needed for wheezing or shortness of breath.     [provider]  benztropine (COGENTIN) 0.5 MG tablet Take 0.5 mg by mouth 2 (two) times daily.    [provider]  budesonide-formoterol (SYMBICORT) 160-4.5 MCG/ACT inhaler Inhale into the lungs. 03/03/16   [provider]  ciprofloxacin (CIPRO) 500 MG tablet  08/12/16   [provider]  FLUoxetine (PROZAC) 20 MG capsule Take 60 mg by mouth every morning.    [provider]  hydrocortisone (ANUSOL-HC) 2.5 % rectal cream Place rectally 2 (two) times daily. Patient taking differently: Place 1 application rectally 2 (two) times daily as needed. For hemorrhoids 05/11/15   Gladstone Lighter, MD  levothyroxine (SYNTHROID, LEVOTHROID) 50 MCG tablet Take 50 mcg by mouth daily before breakfast.    [provider]  nitrofurantoin, macrocrystal-monohydrate, (MACROBID) 100 MG capsule  09/02/16   [provider]  opium-belladonna (B&O SUPPRETTES) 16.2-60 MG suppository Place 1 suppository rectally every 6 (six) hours as needed for bladder spasms. 05/11/15   Gladstone Lighter, MD  polyethylene glycol (GOLYTELY) 236 g solution Drink one 8 oz glass every 20 mins until stools are clear. 10/07/16   Lucilla Lame, MD  QUEtiapine (SEROQUEL) 200 MG tablet  09/08/16   [provider]  risperiDONE (RISPERDAL) 0.5 MG tablet Take 0.5 mg by mouth at bedtime. Pt takes with a 3mg  tablet.    [provider]  risperiDONE (RISPERDAL) 3 MG tablet Take 3 mg by mouth at bedtime. Pt takes with a 0.5mg  tablet.     [provider]  sodium bicarbonate 650 MG tablet Take 650 mg by mouth 2 (two) times daily.     [provider]  sodium chloride 0.9 % infusion Inject 10 mLs into the vein 2 (two) times daily. *Infuse in catheter*    [provider]  solifenacin (VESICARE) 5 MG tablet Take 5 mg by mouth every morning.    [provider]      VITAL SIGNS:  Blood pressure (!) 149/78, pulse 69, temperature 97.9 F (36.6 C), resp. rate 16, height 6' (1.829 m), weight 70.8 kg, SpO2 100 %.  PHYSICAL EXAMINATION:  GENERAL:  63 y.o.-year-old patient lying in the bed with no acute distress. disheveled EYES: Pupils equal, round, reactive to light and accommodation. No scleral icterus. Extraocular muscles intact.  HEENT: Head atraumatic, normocephalic. Oropharynx and nasopharynx clear.  NECK:  Supple, no jugular venous distention. No thyroid enlargement, no tenderness.  LUNGS: Normal breath sounds bilaterally, no wheezing, rales,rhonchi or crepitation. No use of accessory muscles of respiration.  CARDIOVASCULAR: S1,  S2 normal. No murmurs, rubs, or gallops.  ABDOMEN: Soft, nontender, nondistended. Bowel sounds present. No organomegaly or mass.  EXTREMITIES: No pedal edema, cyanosis, or clubbing.  NEUROLOGIC: Cranial nerves II through XII are intact. Muscle strength 5/5 in all extremities. Sensation intact. Gait not checked.  PSYCHIATRIC: The patient is alert and oriented x 2.  SKIN: No obvious rash, lesion, or ulcer.   LABORATORY PANEL:   CBC Recent Labs  Lab 08/20/18 1028  WBC 16.6*  HGB 12.9*  HCT 39.3  PLT 130*   ------------------------------------------------------------------------------------------------------------------  Chemistries  Recent Labs  Lab 08/20/18 1028  NA 137  K 4.0  CL 109  CO2 22  GLUCOSE 82  BUN 31*  CREATININE 1.39*  CALCIUM 8.8*    ------------------------------------------------------------------------------------------------------------------  Cardiac Enzymes No results for input(s): TROPONINI in the last 168 hours. ------------------------------------------------------------------------------------------------------------------  RADIOLOGY:  Ct Head Wo Contrast  Result Date: 08/20/2018 CLINICAL DATA:  63 year old male with hypoglycemia, altered mental status. EXAM: CT HEAD WITHOUT CONTRAST TECHNIQUE: Contiguous axial images were obtained from the base of the skull through the vertex without intravenous contrast. COMPARISON:  Head CT 01/17/2015 and earlier. FINDINGS: Brain: Mild generalized cerebral volume loss since 2016. No midline shift, ventriculomegaly, mass effect, evidence of mass lesion, intracranial hemorrhage or evidence of cortically based acute infarction. Gray-white matter differentiation is within normal limits throughout the brain. Vascular: Mild Calcified atherosclerosis at the skull base. No suspicious intracranial vascular hyperdensity. Skull: Negative. Sinuses/Orbits: Visualized paranasal sinuses and mastoids are stable and well pneumatized. Other: Negative orbit and scalp soft tissues. IMPRESSION: Stable and normal for age noncontrast CT appearance of the brain. Electronically Signed   By: Genevie Ann M.D.   On: 08/20/2018 11:33   Ct Renal Stone Study  Result Date: 08/20/2018 CLINICAL DATA:  Flank pain.  Hematuria.  History of stones. EXAM: CT ABDOMEN AND PELVIS WITHOUT CONTRAST TECHNIQUE: Multidetector CT imaging of the abdomen and pelvis was performed following the standard protocol without IV contrast. COMPARISON:  CT scan dated 11/06/2012 and 01/17/2015 FINDINGS: Lower chest: There has been progression of slight interstitial lung disease at the bases posteriorly with tiny bilateral loculated pleural effusions posteriorly at the bases. Heart size is normal. Aortic atherosclerosis. Pericardial effusion.  Hepatobiliary: There multiple low-density well-defined lesions in the liver consistent with benign appendix cysts. Several of these have increased in size since the prior exam. The largest is 18 mm in the posterior aspect of the inferior portion of the right lobe. No worrisome lesions. Biliary tree is normal. Pancreas: Unremarkable. No pancreatic ductal dilatation or surrounding inflammatory changes. Spleen: Normal in size without focal abnormality. Adrenals/Urinary Tract: Right nephrectomy. Large staghorn calculus in mid and upper pole of the left kidney. Lobulated 13 mm stone left renal pelvis. No left hydronephrosis. The left ureter appears normal. Bladder appears normal. No discrete bladder calculi. Numerous calcifications in the prostate gland. Stomach/Bowel: Stomach is within normal limits. Appendix has been removed. No evidence of bowel wall thickening, distention, or inflammatory changes. There are a few diverticula in the colon. Vascular/Lymphatic: Aortic atherosclerosis. No enlarged abdominal or pelvic lymph nodes. Reproductive: Extensive benign calcifications in the prostate gland. Other: No abdominal wall hernia or abnormality. No abdominopelvic ascites. Musculoskeletal: No acute or significant osseous findings. IMPRESSION: 1. New large staghorn calculus in the left kidney as well as a 11 mm stone in the left renal pelvis. 2. No hydronephrosis or other acute abnormality in the abdomen or pelvis. 3.  Aortic Atherosclerosis (ICD10-I70.0). 4. Progressive chronic interstitial lung disease at  the bases posteriorly with tiny loculated effusions at each base. Electronically Signed   By: Lorriane Shire M.D.   On: 08/20/2018 13:55    EKG:    IMPRESSION AND PLAN:   Alan Small  is a 63 y.o. male with a known history of adult schizophrenia, gastric ulcer, Gerd, depression, COPD X smoker, hypothyroidism comes to the emergency room from family care home. 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 He received  500 mL of D10. Sugar came up to 156. Patient currently is awake alert.  1. Hypoglycemia etiology unclear -patient has no history of diabetes. He has one time episode of hypoglycemia in the past per patient -from a family care home -continue D10 dextrose drip, regular diet, CBG every two hours -patient is asymptomatic -I will send plasma insulin, C-peptide, sulfonylurea and Beta-hydroxybuarate levels checked -will need to follow-up with primary care physician closely. -? MRI abdomen depending on about blood work.----Defer to outpatient PCP for follow-up  2. Left renal pelvic staghorn calculus with UTI -patient received dose of stroke Rocephin -Keflex 500 BID from tomorrow -no obstructive neuropathy noted. Patient definitely will need outpatient urology follow-up  3. Paranoid schizophrenia -continue home meds  4. [name] continue PPI  5. COPD stable patient states he quit smoking three weeks ago  6. DVT prophylaxis subcu Lovenox  No family in the ER    All the records are reviewed and case discussed with ED provider.   CODE STATUS: full  TOTAL TIME TAKING CARE OF THIS PATIENT: *50* minutes.    Fritzi Mandes M.D on 08/20/2018 at 3:57 PM  Between 7am to 6pm - Pager - [phone]  After 6pm go to [email or web address] - password [email or web address] [facility]  [facility]  Office  [phone]  CC: Primary care physician; [name], MD

## 2018-08-20 NOTE — ED Notes (Signed)
Attempted to call report, RN unable to take at this time.

## 2018-08-20 NOTE — ED Notes (Signed)
Pt stays at [facility].  Clifton James pt RN at care home- [phone]

## 2018-08-20 NOTE — ED Notes (Signed)
Pt noted to be lethargic with a blank stare, BS reevaluated, Dr. Cinda Quest notified.

## 2018-08-20 NOTE — ED Notes (Signed)
Pt more alert at this time, able to answer questions appropriately.  Will monitor closely.

## 2018-08-20 NOTE — ED Notes (Signed)
ED TO INPATIENT HANDOFF REPORT  ED Nurse Name and Phone #: Anderson Malta [phone]  S Name/Age/Gender Alan Small 63 y.o. male Room/Bed: ED17A/ED17A  Code Status   Code Status: Prior  Home/SNF/Other [place] Patient oriented to: self, place, time and situation Is this baseline? Yes   Triage Complete: Triage complete  Chief Complaint Hypoglycemia ems  Triage Note PT to ER via EMS from his care home.  Pt was noted to have CBG of 29 per first responder.  Pt has received 500cc D10 and recheck for EMS was 156.  Per EMS pt had original IV to right AC and 500cc D10 infused followed by swelling to upper arm.  Possible infiltration,Dr. Malinda aware of same, will monitor.    Allergies Allergies  Allergen Reactions  . Mellaril [Thioridazine] Shortness Of Breath and Swelling  . Buprenorphine Itching  . Morphine And Related Itching  . Navane [Thiothixene] Other (See Comments)    Reaction:  GI upset   . Thorazine [Chlorpromazine] Other (See Comments)    Reaction:  Dizziness/fainting     Level of Care/Admitting Diagnosis ED Disposition    ED Disposition Condition [facility]: [place] [100120]  Level of Care: Med-Surg [16]  Diagnosis: Hypoglycemia [902409]  Admitting Physician: Odessa Fleming  Attending Physician: Fritzi Mandes [[id number]]  PT Class (Do Not Modify): Observation [104]  PT Acc Code (Do Not Modify): Observation [10022]       B Medical/Surgery History Past Medical History:  Diagnosis Date  . Asthma   . COPD (chronic obstructive pulmonary disease) ([place])   . Depression   . External hemorrhoids   . Gastric ulcer   . GERD (gastroesophageal reflux disease)   . Hydronephrosis   . Hypothyroidism    hypo  . Insomnia   . Mental disorder   . Myocardial infarction ([place])   . Paranoid schizophrenia ([place])   . Renal disorder    renal failure  . Seizures ([place])   . Stroke [facility])    Past Surgical History:  Procedure  Laterality Date  . APPENDECTOMY    . COLONOSCOPY WITH PROPOFOL N/A 12/07/2014   Procedure: COLONOSCOPY WITH PROPOFOL;  Surgeon: Manya Silvas, MD;  Location: [facility] ENDOSCOPY;  Service: Endoscopy;  Laterality: N/A;  . COLONOSCOPY WITH PROPOFOL N/A 10/10/2016   Procedure: COLONOSCOPY WITH PROPOFOL;  Surgeon: Lucilla Lame, MD;  Location: [place];  Service: Endoscopy;  Laterality: N/A;  . ESOPHAGOGASTRODUODENOSCOPY N/A 12/07/2014   Procedure: ESOPHAGOGASTRODUODENOSCOPY (EGD);  Surgeon: Manya Silvas, MD;  Location: [facility] ENDOSCOPY;  Service: Endoscopy;  Laterality: N/A;  . NEPHRECTOMY    . POLYPECTOMY  10/10/2016   Procedure: POLYPECTOMY;  Surgeon: Lucilla Lame, MD;  Location: [place];  Service: Endoscopy;;  . SUPRAPUBIC CATHETER INSERTION       A IV Location/Drains/Wounds Patient Lines/Drains/Airways Status   Active Line/Drains/Airways    Name:   Placement date:   Placement time:   Site:   Days:   Peripheral IV 02/26/16 Right;Upper Wrist   02/26/16    1638    Wrist   906   Peripheral IV 08/20/18 Left Hand   08/20/18    1013    Hand   less than 1   Peripheral IV 08/20/18 Left Antecubital   08/20/18    1444    Antecubital   less than 1   PICC Double Lumen 73/53/29 PICC Right Basilic 49 cm 4 cm   92/42/68    1430  1200   PICC Double Lumen 93/81/01 PICC Left Basilic 48 cm 0 cm   75/10/25    1731     905   Suprapubic Catheter Latex 14 Fr.   01/18/16    1200    Latex   945   Incision (Closed) 10/10/16 Rectum Other (Comment)   10/10/16    1026     679          Intake/Output Last 24 hours No intake or output data in the 24 hours ending 08/20/18 1617  Labs/Imaging Results for orders placed or performed during the hospital encounter of 08/20/18 (from the past 48 hour(s))  Glucose, capillary     Status: None   Collection Time: 08/20/18 10:20 AM  Result Value Ref Range   Glucose-Capillary 91 70 - 99 mg/dL  Basic metabolic panel     Status: Abnormal   Collection  Time: 08/20/18 10:28 AM  Result Value Ref Range   Sodium 137 135 - 145 mmol/L   Potassium 4.0 3.5 - 5.1 mmol/L   [place] 109 98 - 111 mmol/L   CO2 22 22 - 32 mmol/L   Glucose, Bld 82 70 - 99 mg/dL   BUN 31 (H) 8 - 23 mg/dL   Creatinine, Ser 1.39 (H) 0.61 - 1.24 mg/dL   Calcium 8.8 (L) 8.9 - 10.3 mg/dL   GFR calc non Af Amer 53 (L) >60 mL/min   GFR calc Af Amer >60 >60 mL/min   Anion gap 6 5 - 15    Comment: Performed at [facility], [address] [place], [place] [address]  CBC with Differential     Status: Abnormal   Collection Time: 08/20/18 10:28 AM  Result Value Ref Range   WBC 16.6 (H) 4.0 - 10.5 K/uL   RBC 4.15 (L) 4.22 - 5.81 MIL/uL   Hemoglobin 12.9 (L) 13.0 - 17.0 g/dL   HCT 39.3 39.0 - 52.0 %   MCV 94.7 80.0 - 100.0 fL   MCH 31.1 26.0 - 34.0 pg   MCHC 32.8 30.0 - 36.0 g/dL   RDW 14.1 11.5 - 15.5 %   Platelets 130 (L) 150 - 400 K/uL   nRBC 0.0 0.0 - 0.2 %   Neutrophils Relative % 90 %   Neutro Abs 14.9 (H) 1.7 - 7.7 K/uL   Lymphocytes Relative 5 %   Lymphs Abs 0.8 0.7 - 4.0 K/uL   Monocytes Relative 4 %   Monocytes Absolute 0.7 0.1 - 1.0 K/uL   Eosinophils Relative 0 %   Eosinophils Absolute 0.0 0.0 - 0.5 K/uL   Basophils Relative 0 %   Basophils Absolute 0.0 0.0 - 0.1 K/uL   Immature Granulocytes 1 %   Abs Immature Granulocytes 0.24 (H) 0.00 - 0.07 K/uL    Comment: Performed at [facility], [address] [place], [place] [address]  TSH     Status: None   Collection Time: 08/20/18 10:28 AM  Result Value Ref Range   TSH 4.315 0.350 - 4.500 uIU/mL    Comment: Performed by a 3rd Generation assay with a functional sensitivity of <=0.01 uIU/mL. Performed at [facility], [address] [place], [place] [address]   Urinalysis, Complete w Microscopic     Status: Abnormal   Collection Time: 08/20/18 11:26 AM  Result Value Ref Range   Color, Urine YELLOW (A) YELLOW   APPearance HAZY (A) CLEAR   Specific Gravity, Urine 1.011  1.005 - 1.030   pH 7.0  5.0 - 8.0   Glucose, UA NEGATIVE NEGATIVE mg/dL   Hgb urine dipstick LARGE (A) NEGATIVE   Bilirubin Urine NEGATIVE NEGATIVE   Ketones, ur NEGATIVE NEGATIVE mg/dL   Protein, ur NEGATIVE NEGATIVE mg/dL   Nitrite POSITIVE (A) NEGATIVE   Leukocytes,Ua LARGE (A) NEGATIVE   RBC / HPF >50 (H) 0 - 5 RBC/hpf   WBC, UA 21-50 0 - 5 WBC/hpf   Bacteria, UA MANY (A) NONE SEEN   Squamous Epithelial / LPF 0-5 0 - 5   WBC Clumps PRESENT     Comment: Performed at [facility], [address] [place], [place] [address]  Glucose, capillary     Status: Abnormal   Collection Time: 08/20/18  2:15 PM  Result Value Ref Range   Glucose-Capillary 17 (LL) 70 - 99 mg/dL   Comment 1 Call MD NNP PA CNM   Glucose, capillary     Status: Abnormal   Collection Time: 08/20/18  2:25 PM  Result Value Ref Range   Glucose-Capillary 242 (H) 70 - 99 mg/dL  Glucose, capillary     Status: Abnormal   Collection Time: 08/20/18  2:34 PM  Result Value Ref Range   Glucose-Capillary 104 (H) 70 - 99 mg/dL  Lactic acid, plasma     Status: None   Collection Time: 08/20/18  2:46 PM  Result Value Ref Range   Lactic Acid, Venous 1.1 0.5 - 1.9 mmol/L    Comment: Performed at [facility], [address] [place], [place] [address]  Glucose, capillary     Status: Abnormal   Collection Time: 08/20/18  3:26 PM  Result Value Ref Range   Glucose-Capillary 48 (L) 70 - 99 mg/dL  Glucose, capillary     Status: Abnormal   Collection Time: 08/20/18  4:00 PM  Result Value Ref Range   Glucose-Capillary 354 (H) 70 - 99 mg/dL   Ct Head Wo Contrast  Result Date: 08/20/2018 CLINICAL DATA:  63 year old male with hypoglycemia, altered mental status. EXAM: CT HEAD WITHOUT CONTRAST TECHNIQUE: Contiguous axial images were obtained from the base of the skull through the vertex without intravenous contrast. COMPARISON:  Head CT 01/17/2015 and earlier. FINDINGS: Brain: Mild generalized cerebral volume loss  since 2016. No midline shift, ventriculomegaly, mass effect, evidence of mass lesion, intracranial hemorrhage or evidence of cortically based acute infarction. Gray-white matter differentiation is within normal limits throughout the brain. Vascular: Mild Calcified atherosclerosis at the skull base. No suspicious intracranial vascular hyperdensity. Skull: Negative. Sinuses/Orbits: Visualized paranasal sinuses and mastoids are stable and well pneumatized. Other: Negative orbit and scalp soft tissues. IMPRESSION: Stable and normal for age noncontrast CT appearance of the brain. Electronically Signed   By: Genevie Ann M.D.   On: 08/20/2018 11:33   Ct Renal Stone Study  Result Date: 08/20/2018 CLINICAL DATA:  Flank pain.  Hematuria.  History of stones. EXAM: CT ABDOMEN AND PELVIS WITHOUT CONTRAST TECHNIQUE: Multidetector CT imaging of the abdomen and pelvis was performed following the standard protocol without IV contrast. COMPARISON:  CT scan dated 11/06/2012 and 01/17/2015 FINDINGS: Lower chest: There has been progression of slight interstitial lung disease at the bases posteriorly with tiny bilateral loculated pleural effusions posteriorly at the bases. Heart size is normal. Aortic atherosclerosis. Pericardial effusion. Hepatobiliary: There multiple low-density well-defined lesions in the liver consistent with benign appendix cysts. Several of these have increased in size since the prior exam. The largest is 18 mm in the posterior aspect of the inferior portion of the  right lobe. No worrisome lesions. Biliary tree is normal. Pancreas: Unremarkable. No pancreatic ductal dilatation or surrounding inflammatory changes. Spleen: Normal in size without focal abnormality. Adrenals/Urinary Tract: Right nephrectomy. Large staghorn calculus in mid and upper pole of the left kidney. Lobulated 13 mm stone left renal pelvis. No left hydronephrosis. The left ureter appears normal. Bladder appears normal. No discrete bladder  calculi. Numerous calcifications in the prostate gland. Stomach/Bowel: Stomach is within normal limits. Appendix has been removed. No evidence of bowel wall thickening, distention, or inflammatory changes. There are a few diverticula in the colon. Vascular/Lymphatic: Aortic atherosclerosis. No enlarged abdominal or pelvic lymph nodes. Reproductive: Extensive benign calcifications in the prostate gland. Other: No abdominal wall hernia or abnormality. No abdominopelvic ascites. Musculoskeletal: No acute or significant osseous findings. IMPRESSION: 1. New large staghorn calculus in the left kidney as well as a 11 mm stone in the left renal pelvis. 2. No hydronephrosis or other acute abnormality in the abdomen or pelvis. 3.  Aortic Atherosclerosis (ICD10-I70.0). 4. Progressive chronic interstitial lung disease at the bases posteriorly with tiny loculated effusions at each base. Electronically Signed   By: Lorriane Shire M.D.   On: 08/20/2018 13:55    Pending Labs Unresulted Labs (From admission, onward)    Start     Ordered   08/20/18 1556  Insulin and C-Peptide  Add-on,   AD     08/20/18 1555   08/20/18 1556  Beta-hydroxybutyric acid  Add-on,   AD     08/20/18 1555   08/20/18 1533  Sulfonylurea Hypoglycemics Panel, blood  Add-on,   AD     08/20/18 1532   08/20/18 1444  Urine culture  Add-on,   AD     08/20/18 1444   08/20/18 1424  Culture, blood (routine x 2)  BLOOD CULTURE X 2,   STAT     08/20/18 1423   08/20/18 1421  Lactic acid, plasma  Now then every 2 hours,   STAT     08/20/18 1422   Signed and Held  HIV antibody (Routine Testing)  Once,   R     Signed and Held   Signed and Held  CBC  (enoxaparin (LOVENOX)    CrCl >/= 30 ml/min)  Once,   R    Comments:  Baseline for enoxaparin therapy IF NOT ALREADY DRAWN.  Notify MD if PLT < 100 K.    Signed and Held   Signed and Held  Creatinine, serum  (enoxaparin (LOVENOX)    CrCl >/= 30 ml/min)  Once,   R    Comments:  Baseline for enoxaparin  therapy IF NOT ALREADY DRAWN.    Signed and Held   Signed and Held  Creatinine, serum  (enoxaparin (LOVENOX)    CrCl >/= 30 ml/min)  Weekly,   R    Comments:  while on enoxaparin therapy    Signed and Held   Signed and Held  Basic metabolic panel  Tomorrow morning,   R     Signed and Held          Vitals/Pain Today's Vitals   08/20/18 1200 08/20/18 1230 08/20/18 1300 08/20/18 1430  BP: (!) 150/78 (!) 157/87 (!) 156/83 (!) 149/78  Pulse: 78 80 80 69  Resp: 16   16  Temp:      SpO2: 99% 99% 99% 100%  Weight:      Height:      PainSc:        Isolation Precautions No active  isolations  Medications Medications  dextrose 10 % infusion ( Intravenous Rate/Dose Change 08/20/18 1532)  acetaminophen (TYLENOL) tablet 650 mg (650 mg Oral Given 08/20/18 1554)    Or  acetaminophen (TYLENOL) suppository 650 mg ( Rectal See Alternative 08/20/18 1554)  cephALEXin (KEFLEX) capsule 500 mg (has no administration in time range)  cefTRIAXone (ROCEPHIN) 1 g in sodium chloride 0.9 % 100 mL IVPB (0 g Intravenous Stopped 08/20/18 1531)  dextrose 50 % solution 25 g (25 g Intravenous Given 08/20/18 1445)    Mobility walks with device High fall risk   Focused Assessments Cardiac Assessment Handoff:    Lab Results  Component Value Date   CKTOTAL 660 (H) 01/17/2015   CKMB 1.8 12/05/2013   TROPONINI <0.03 01/17/2015   No results found for: DDIMER Does the Patient currently have chest pain? No     R Recommendations: See Admitting Provider Note  Report given to:   Additional Notes:

## 2018-08-20 NOTE — ED Provider Notes (Signed)
[facility] Emergency Department Provider Note   ____________________________________________   First MD Initiated Contact with Patient 08/20/18 1104     (approximate)  I have reviewed the triage vital signs and the nursing notes.   HISTORY  Chief Complaint Hypoglycemia    HPI NAJIB COLMENARES is a 63 y.o. male EMS reports they were called to the care home for sick call.  When they got there fire department had arrived and found the patient was hypoglycemic with a fingerstick of 29.  They gave a bag of D10 unfortunately infiltrated into the right upper arm secondary to D10 was given and resolved.  Patient is awake alert oriented feels normal.  Nurses and EMS notes 1 mm difference between pupils.  Looking at patient's driver's license does not really help much.  He does have a mild headache so we will going to get a CT of his head.         Past Medical History:  Diagnosis Date  . Asthma   . COPD (chronic obstructive pulmonary disease) ([place])   . Depression   . External hemorrhoids   . Gastric ulcer   . GERD (gastroesophageal reflux disease)   . Hydronephrosis   . Hypothyroidism    hypo  . Insomnia   . Mental disorder   . Myocardial infarction ([place])   . Paranoid schizophrenia ([place])   . Renal disorder    renal failure  . Seizures ([place])   . Stroke [facility])     Patient Active Problem List   Diagnosis Date Noted  . Blood in stool   . Change in bowel habits   . Constipation   . Polyp of sigmoid colon   . Depression 02/26/2016  . COPD (chronic obstructive pulmonary disease) ([place]) 02/03/2016  . Suprapubic catheter ([place]) 12/17/2015  . Urethral stricture 12/17/2015  . Urinary retention 10/27/2015  . Tobacco use disorder 08/27/2015  . Malnutrition of moderate degree 05/09/2015  . Sepsis ([place]) 05/06/2015  . UTI (lower urinary tract infection) 01/17/2015  . Hypoglycemia 01/17/2015  . Gastroesophageal reflux disease 10/28/2014  . Bright red rectal  bleeding 10/26/2014  . Chronic constipation 10/26/2014  . Hydronephrosis of left kidney 01/17/2014  . Neurogenic bladder 06/21/2013  . Urolithiasis 03/15/2013  . Nephrolithiasis 03/04/2013  . Metabolic acidosis 79/89/2119  . Acute renal failure ([place]) 11/03/2012  . Hyperkalemia 11/03/2012  . Pyelonephritis 11/03/2012  . Schizophrenia ([place]) 11/03/2012  . Hypothyroidism 11/03/2012  . Severe sepsis ([place]) 11/03/2012  . Protein-calorie malnutrition, severe ([place]) 11/03/2012  . Encephalopathy acute 11/03/2012  . Chronic kidney disease, stage IV (severe) ([place]) 07/02/2012  . 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 Kidney stone 04/13/2012    Past Surgical History:  Procedure Laterality Date  . APPENDECTOMY    . COLONOSCOPY WITH PROPOFOL N/A 12/07/2014   Procedure: COLONOSCOPY WITH PROPOFOL;  Surgeon: Manya Silvas, MD;  Location: [facility] ENDOSCOPY;  Service: Endoscopy;  Laterality: N/A;  . COLONOSCOPY WITH PROPOFOL N/A 10/10/2016   Procedure: COLONOSCOPY WITH PROPOFOL;  Surgeon: Lucilla Lame, MD;  Location: [place];  Service: Endoscopy;  Laterality: N/A;  . ESOPHAGOGASTRODUODENOSCOPY N/A 12/07/2014   Procedure: ESOPHAGOGASTRODUODENOSCOPY (EGD);  Surgeon: Manya Silvas, MD;  Location: [facility] ENDOSCOPY;  Service: Endoscopy;  Laterality: N/A;  . NEPHRECTOMY    . POLYPECTOMY  10/10/2016   Procedure: POLYPECTOMY;  Surgeon: Lucilla Lame, MD;  Location: [place];  Service: Endoscopy;;  . SUPRAPUBIC CATHETER INSERTION      Prior to Admission medications  Medication Sig Start Date End Date Taking? Authorizing Provider  acetaminophen (TYLENOL) 500 MG tablet Take 1,000 mg by mouth 2 (two) times daily as needed for mild pain, moderate pain or fever.    Yes [provider]  albuterol-ipratropium (COMBIVENT) 18-103 MCG/ACT inhaler Inhale 1 puff into the lungs every 6 (six) hours as needed for wheezing or shortness of breath.    Yes [provider]  budesonide-formoterol  (SYMBICORT) 160-4.5 MCG/ACT inhaler Inhale 1 puff into the lungs 2 (two) times daily.  03/03/16  Yes [provider]  FLUoxetine (PROZAC) 20 MG capsule Take 60 mg by mouth every morning.   Yes [provider]  hydrocortisone (ANUSOL-HC) 25 MG suppository Place 25 mg rectally 2 (two) times daily.   Yes [provider]  INVEGA SUSTENNA 156 MG/ML SUSY injection Inject 156 mg into the muscle every 30 (thirty) days. 08/20/18  Yes [provider]  levothyroxine (SYNTHROID, LEVOTHROID) 50 MCG tablet Take 50 mcg by mouth daily before breakfast.   Yes [provider]  paliperidone (INVEGA) 6 MG 24 hr tablet Take 6 mg by mouth at bedtime.   Yes [provider]  sodium bicarbonate 650 MG tablet Take 650 mg by mouth 2 (two) times daily.    Yes [provider]  tamsulosin (FLOMAX) 0.4 MG CAPS capsule Take 0.4 mg by mouth every morning.    Yes [provider]  hydrocortisone (ANUSOL-HC) 2.5 % rectal cream Place rectally 2 (two) times daily. Patient taking differently: Place 1 application rectally 2 (two) times daily as needed. For hemorrhoids 05/11/15   Gladstone Lighter, MD  opium-belladonna (B&O SUPPRETTES) 16.2-60 MG suppository Place 1 suppository rectally every 6 (six) hours as needed for bladder spasms. Patient not taking: Reported on 08/20/2018 05/11/15   Gladstone Lighter, MD  polyethylene glycol (GOLYTELY) 236 g solution Drink one 8 oz glass every 20 mins until stools are clear. 10/07/16   Lucilla Lame, MD  sodium chloride 0.9 % infusion Inject 10 mLs into the vein 2 (two) times daily. *Infuse in catheter*    [provider]    Allergies Mellaril [thioridazine]; Buprenorphine; Morphine and related; Navane [thiothixene]; and Thorazine [chlorpromazine]  Family History  Problem Relation Age of Onset  . Stroke Father   . Pneumonia Father   . Brain cancer Mother     Social History Social History   Tobacco Use  . Smoking  status: Current Every Day Smoker    Packs/day: 1.00    Years: 55.00    Pack years: 55.00    Types: Cigarettes  . Smokeless tobacco: Former Systems developer    Types: Chew  Substance Use Topics  . Alcohol use: No  . Drug use: No    Review of Systems  Constitutional: No fever/chills Eyes: No visual changes. ENT: No sore throat. Cardiovascular: Denies chest pain. Respiratory: Denies shortness of breath. Gastrointestinal: No abdominal pain.  No nausea, no vomiting.  No diarrhea.  No constipation. Genitourinary: Negative for dysuria. Musculoskeletal: Negative for back pain. Skin: Negative for rash. Neurological: Negative for  focal weakness   ____________________________________________   PHYSICAL EXAM:  VITAL SIGNS: ED Triage Vitals  Enc Vitals Group     BP 08/20/18 1016 (!) 154/99     Pulse Rate 08/20/18 1016 68     Resp 08/20/18 1016 18     Temp 08/20/18 1016 97.9 F (36.6 C)     Temp src --      SpO2 08/20/18 1016 100 %  Weight 08/20/18 1018 156 lb (70.8 kg)     Height 08/20/18 1018 6' (1.829 m)     Head Circumference --      Peak Flow --      Pain Score 08/20/18 1017 8     Pain Loc --      Pain Edu? --      Excl. in [place]? --     Constitutional: Alert and oriented. Well appearing and in no acute distress. Eyes: Conjunctivae are normal.  Pupils are round right is about a millimeter possibly slightly more larger than the left. EOMI. Head: Atraumatic. Nose: No congestion/rhinnorhea. Mouth/Throat: Mucous membranes are moist.  Oropharynx non-erythematous. Neck: No stridor. Cardiovascular: Normal rate, regular rhythm. Grossly normal heart sounds.  Good peripheral circulation. Respiratory: Normal respiratory effort.  No retractions. Lungs CTAB. Gastrointestinal: Soft and nontender. No distention. No abdominal bruits. No CVA tenderness. Musculoskeletal: No lower extremity tenderness nor edema.   Neurologic:  Normal speech and language. No gross focal neurologic deficits are  appreciated.  Renal nerves II through XII are intact motor is 5/5 throughout sensations intact throughout there is some slight ataxia possibly slightly worse on the left he seems to be old. Skin:  Skin is warm, dry and intact. No rash noted. Psychiatric: Mood and affect are normal. Speech and behavior are normal.  ____________________________________________   LABS (all labs ordered are listed, but only abnormal results are displayed)  Labs Reviewed  URINALYSIS, COMPLETE (UACMP) WITH MICROSCOPIC - Abnormal; Notable for the following components:      Result Value   Color, Urine YELLOW (*)    APPearance HAZY (*)    Hgb urine dipstick LARGE (*)    Nitrite POSITIVE (*)    Leukocytes,Ua LARGE (*)    RBC / HPF >50 (*)    Bacteria, UA MANY (*)    All other components within normal limits  BASIC METABOLIC PANEL - Abnormal; Notable for the following components:   BUN 31 (*)    Creatinine, Ser 1.39 (*)    Calcium 8.8 (*)    GFR calc non Af Amer 53 (*)    All other components within normal limits  CBC WITH DIFFERENTIAL/PLATELET - Abnormal; Notable for the following components:   WBC 16.6 (*)    RBC 4.15 (*)    Hemoglobin 12.9 (*)    Platelets 130 (*)    Neutro Abs 14.9 (*)    Abs Immature Granulocytes 0.24 (*)    All other components within normal limits  GLUCOSE, CAPILLARY - Abnormal; Notable for the following components:   Glucose-Capillary 17 (*)    All other components within normal limits  GLUCOSE, CAPILLARY - Abnormal; Notable for the following components:   Glucose-Capillary 242 (*)    All other components within normal limits  GLUCOSE, CAPILLARY - Abnormal; Notable for the following components:   Glucose-Capillary 104 (*)    All other components within normal limits  GLUCOSE, CAPILLARY - Abnormal; Notable for the following components:   Glucose-Capillary 48 (*)    All other components within normal limits  GLUCOSE, CAPILLARY - Abnormal; Notable for the following components:    Glucose-Capillary 354 (*)    All other components within normal limits  CULTURE, BLOOD (ROUTINE X 2)  CULTURE, BLOOD (ROUTINE X 2)  URINE CULTURE  GLUCOSE, CAPILLARY  LACTIC ACID, PLASMA  TSH  BETA-HYDROXYBUTYRIC ACID  GLUCOSE, CAPILLARY  LACTIC ACID, PLASMA  SULFONYLUREA HYPOGLYCEMICS PANEL, SERUM  INSULIN AND C-PEPTIDE, SERUM  HIV ANTIBODY (ROUTINE TESTING W  REFLEX)  CBC  CREATININE, SERUM  BASIC METABOLIC PANEL  CBG MONITORING, ED   ____________________________________________  EKG   ____________________________________________  RADIOLOGY  ED MD interpretation:   Official radiology report(s): Ct Head Wo Contrast  Result Date: 08/20/2018 CLINICAL DATA:  63 year old male with hypoglycemia, altered mental status. EXAM: CT HEAD WITHOUT CONTRAST TECHNIQUE: Contiguous axial images were obtained from the base of the skull through the vertex without intravenous contrast. COMPARISON:  Head CT 01/17/2015 and earlier. FINDINGS: Brain: Mild generalized cerebral volume loss since 2016. No midline shift, ventriculomegaly, mass effect, evidence of mass lesion, intracranial hemorrhage or evidence of cortically based acute infarction. Gray-white matter differentiation is within normal limits throughout the brain. Vascular: Mild Calcified atherosclerosis at the skull base. No suspicious intracranial vascular hyperdensity. Skull: Negative. Sinuses/Orbits: Visualized paranasal sinuses and mastoids are stable and well pneumatized. Other: Negative orbit and scalp soft tissues. IMPRESSION: Stable and normal for age noncontrast CT appearance of the brain. Electronically Signed   By: Genevie Ann M.D.   On: 08/20/2018 11:33   Ct Renal Stone Study  Result Date: 08/20/2018 CLINICAL DATA:  Flank pain.  Hematuria.  History of stones. EXAM: CT ABDOMEN AND PELVIS WITHOUT CONTRAST TECHNIQUE: Multidetector CT imaging of the abdomen and pelvis was performed following the standard protocol without IV contrast.  COMPARISON:  CT scan dated 11/06/2012 and 01/17/2015 FINDINGS: Lower chest: There has been progression of slight interstitial lung disease at the bases posteriorly with tiny bilateral loculated pleural effusions posteriorly at the bases. Heart size is normal. Aortic atherosclerosis. Pericardial effusion. Hepatobiliary: There multiple low-density well-defined lesions in the liver consistent with benign appendix cysts. Several of these have increased in size since the prior exam. The largest is 18 mm in the posterior aspect of the inferior portion of the right lobe. No worrisome lesions. Biliary tree is normal. Pancreas: Unremarkable. No pancreatic ductal dilatation or surrounding inflammatory changes. Spleen: Normal in size without focal abnormality. Adrenals/Urinary Tract: Right nephrectomy. Large staghorn calculus in mid and upper pole of the left kidney. Lobulated 13 mm stone left renal pelvis. No left hydronephrosis. The left ureter appears normal. Bladder appears normal. No discrete bladder calculi. Numerous calcifications in the prostate gland. Stomach/Bowel: Stomach is within normal limits. Appendix has been removed. No evidence of bowel wall thickening, distention, or inflammatory changes. There are a few diverticula in the colon. Vascular/Lymphatic: Aortic atherosclerosis. No enlarged abdominal or pelvic lymph nodes. Reproductive: Extensive benign calcifications in the prostate gland. Other: No abdominal wall hernia or abnormality. No abdominopelvic ascites. Musculoskeletal: No acute or significant osseous findings. IMPRESSION: 1. New large staghorn calculus in the left kidney as well as a 11 mm stone in the left renal pelvis. 2. No hydronephrosis or other acute abnormality in the abdomen or pelvis. 3.  Aortic Atherosclerosis (ICD10-I70.0). 4. Progressive chronic interstitial lung disease at the bases posteriorly with tiny loculated effusions at each base. Electronically Signed   By: Lorriane Shire M.D.    On: 08/20/2018 13:55    ____________________________________________   PROCEDURES  Procedure(s) performed (including Critical Care):  Procedures   ____________________________________________   INITIAL IMPRESSION / ASSESSMENT AND PLAN / ED COURSE   Patient with recurrent episodes of hypoglycemia.  He is not running a fever but he does have a high white count with polys.  He has a UTI.  He has a staghorn calculus on CT of his abdomen which is new.  I am worried that he is becoming septic and this is causing his hypoglycemia.  We will get him in the hospital to stabilize him continue him on a D10 drip and give him antibiotics.  He may need the staghorn removed.             ____________________________________________   FINAL CLINICAL IMPRESSION(S) / ED DIAGNOSES  Final diagnoses:  Hypoglycemia  Urinary tract infection with hematuria, site unspecified  Staghorn calculus     ED Discharge Orders    None       Note:  This document was prepared using Dragon voice recognition software and may include unintentional dictation errors.    Nena Polio, MD 08/20/18 [phone]

## 2018-08-20 NOTE — ED Notes (Signed)
Glucose results discussed with Dr. Posey Pronto, D10 rate increased, and pt provided sandwich tray and cola.

## 2018-08-21 DIAGNOSIS — E162 Hypoglycemia, unspecified: Secondary | ICD-10-CM | POA: Diagnosis not present

## 2018-08-21 LAB — GLUCOSE, CAPILLARY
GLUCOSE-CAPILLARY: 111 mg/dL — AB (ref 70–99)
Glucose-Capillary: 100 mg/dL — ABNORMAL HIGH (ref 70–99)
Glucose-Capillary: 107 mg/dL — ABNORMAL HIGH (ref 70–99)
Glucose-Capillary: 107 mg/dL — ABNORMAL HIGH (ref 70–99)
Glucose-Capillary: 126 mg/dL — ABNORMAL HIGH (ref 70–99)
Glucose-Capillary: 130 mg/dL — ABNORMAL HIGH (ref 70–99)
Glucose-Capillary: 132 mg/dL — ABNORMAL HIGH (ref 70–99)
Glucose-Capillary: 92 mg/dL (ref 70–99)
Glucose-Capillary: 94 mg/dL (ref 70–99)

## 2018-08-21 LAB — BASIC METABOLIC PANEL
Anion gap: 6 (ref 5–15)
BUN: 24 mg/dL — ABNORMAL HIGH (ref 8–23)
CO2: 23 mmol/L (ref 22–32)
Calcium: 8.8 mg/dL — ABNORMAL LOW (ref 8.9–10.3)
Chloride: 110 mmol/L (ref 98–111)
Creatinine, Ser: 1.57 mg/dL — ABNORMAL HIGH (ref 0.61–1.24)
GFR calc Af Amer: 53 mL/min — ABNORMAL LOW (ref >60)
GFR calc non Af Amer: 46 mL/min — ABNORMAL LOW (ref >60)
GLUCOSE: 103 mg/dL — AB (ref 70–99)
Potassium: 4.1 mmol/L (ref 3.5–5.1)
Sodium: 139 mmol/L (ref 135–145)

## 2018-08-21 LAB — CORTISOL: Cortisol, Plasma: 7.9 ug/dL

## 2018-08-21 NOTE — Discharge Instructions (Signed)
Resume diet and activity as before  Do not skip any meals

## 2018-08-21 NOTE — TOC Initial Note (Signed)
Transition of Care [facility]) - Initial/Assessment Note    Patient Details  Name: Alan Small MRN: [id number] Date of Birth: [birth year]  Transition of Care [facility]) CM/SW Contact:    Latanya Maudlin, RN Phone Number: 08/21/2018, 9:17 AM  Clinical Narrative:  Patient admitted to [facility] under observation status for hypoglycemia. RNCM consulted on patient to provide MOON letter and complete assessment. [facility]  Clifton James pt RN at care home- [phone]  Patient uses a cane but requires no other DME and is otherwise independent with activities of daily living.                    Expected Discharge Plan: Group Home(pt from [facility] care [facility]) Barriers to Discharge: Continued Medical Work up   Patient Goals and CMS Choice Patient states their goals for this hospitalization and ongoing recovery are:: [facility]      Expected Discharge Plan and Services Expected Discharge Plan: Group Home(pt from [facility] [facility])       Living arrangements for the past 2 months: (family care home) Expected Discharge Date: 08/23/18                        Prior Living Arrangements/Services Living arrangements for the past 2 months: (family care home) Lives with:: Facility Resident Patient language and need for interpreter reviewed:: No Do you feel safe going back to the place where you live?: Yes               Activities of Daily Living Home Assistive Devices/Equipment: Cane (specify quad or straight), Eyeglasses(straight) ADL Screening (condition at time of admission) Patient's cognitive ability adequate to safely complete daily activities?: Yes Is the patient deaf or have difficulty hearing?: No Does the patient have difficulty seeing, even when wearing glasses/contacts?: Yes Does the patient have difficulty concentrating, remembering, or making decisions?: Yes Patient able to  express need for assistance with ADLs?: Yes Does the patient have difficulty dressing or bathing?: No Independently performs ADLs?: Yes (appropriate for developmental age) Does the patient have difficulty walking or climbing stairs?: Yes Weakness of Legs: Both Weakness of Arms/Hands: None  Permission Sought/Granted Permission sought to share information with : Guardian, Customer service manager                Emotional Assessment Appearance:: Disheveled Attitude/Demeanor/Rapport: Engaged Affect (typically observed): Accepting Orientation: : Fluctuating Orientation (Suspected and/or reported [place])      Admission diagnosis:  Hypoglycemia ems Patient Active Problem List   Diagnosis Date Noted  . Blood in stool   . Change in bowel habits   . Constipation   . Polyp of sigmoid colon   . Depression 02/26/2016  . COPD (chronic obstructive pulmonary disease) ([place]) 02/03/2016  . Suprapubic catheter ([place]) 12/17/2015  . Urethral stricture 12/17/2015  . Urinary retention 10/27/2015  . Tobacco use disorder 08/27/2015  . Malnutrition of moderate degree 05/09/2015  . Sepsis ([place]) 05/06/2015  . UTI (lower urinary tract infection) 01/17/2015  . Hypoglycemia 01/17/2015  . Gastroesophageal reflux disease 10/28/2014  . Bright red rectal bleeding 10/26/2014  . Chronic constipation 10/26/2014  . Hydronephrosis of left kidney 01/17/2014  . Neurogenic bladder 06/21/2013  . Urolithiasis 03/15/2013  . Nephrolithiasis 03/04/2013  . Metabolic acidosis 63/06/6008  . Acute renal failure ([place]) 11/03/2012  . Hyperkalemia 11/03/2012  . Pyelonephritis 11/03/2012  .  Schizophrenia ([place]) 11/03/2012  . Hypothyroidism 11/03/2012  . Severe sepsis ([place]) 11/03/2012  . Protein-calorie malnutrition, severe ([place]) 11/03/2012  . Encephalopathy acute 11/03/2012  . Chronic kidney disease, stage IV (severe) ([place]) 07/02/2012  . 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 Kidney stone 04/13/2012   PCP:   [name], MD Pharmacy:   [facility] [address] [place] [address] Phone: [phone] Fax: [phone]     Social Determinants of Health (SDOH) Interventions    Readmission Risk Interventions No flowsheet data found.

## 2018-08-21 NOTE — TOC Progression Note (Signed)
Transition of Care [facility]) - Progression Note    Patient Details  Name: Alan Small MRN: [id number] Date of Birth: 10-04-1955  Transition [facility]) CM/SW [place], [place] Phone Number: 08/21/2018, 9:51 AM  Clinical Narrative:   CSW has advised on-call APS that the patient has admitted under observation status. The on-call APS worker has given permission for the patient to return to his [facility] once medically stable. TOC is following for discharge facilitation.    Expected Discharge Plan: Group Home(pt from [name]) Barriers to Discharge: Continued Medical Work up  Expected Discharge Plan and Services Expected Discharge Plan: Group Home(pt from [place] [facility])       Living arrangements for the past 2 months: (family care home) Expected Discharge Date: 08/23/18                         Social Determinants of Health (SDOH) Interventions    Readmission Risk Interventions No flowsheet data found.

## 2018-08-21 NOTE — Care Management Obs Status (Signed)
[facility] [place] NOTIFICATION   Patient Details  Name: Alan Small MRN: [id number] Date of Birth: 1955/12/08   Medicare Observation Status Notification Given:  Yes    Jamason Peckham A Shields Pautz, RN 08/21/2018, 9:09 AM

## 2018-08-21 NOTE — Plan of Care (Signed)

## 2018-08-22 DIAGNOSIS — E162 Hypoglycemia, unspecified: Secondary | ICD-10-CM | POA: Diagnosis not present

## 2018-08-22 LAB — HIV ANTIBODY (ROUTINE TESTING W REFLEX): HIV Screen 4th Generation wRfx: NONREACTIVE

## 2018-08-22 LAB — INSULIN AND C-PEPTIDE, SERUM
C-Peptide: 9.5 ng/mL — ABNORMAL HIGH (ref 1.1–4.4)
Insulin: 43.7 u[IU]/mL — ABNORMAL HIGH (ref 2.6–24.9)

## 2018-08-22 LAB — GLUCOSE, CAPILLARY: Glucose-Capillary: 151 mg/dL — ABNORMAL HIGH (ref 70–99)

## 2018-08-22 MED ORDER — CEPHALEXIN 500 MG PO CAPS: 500.0000 mg | ORAL_CAPSULE | Freq: Two times a day (BID) | ORAL | 0 refills | Status: DC

## 2018-08-22 MED ORDER — NYSTATIN 100000 UNIT/GM EX POWD: Freq: Two times a day (BID) | CUTANEOUS | 0 refills | Status: DC

## 2018-08-22 MED ORDER — NYSTATIN 100000 UNIT/GM EX POWD
Freq: Two times a day (BID) | CUTANEOUS | Status: DC
  Administered 2018-08-22: 14:00:00 via TOPICAL
  Filled 2018-08-22: qty 15

## 2018-08-22 NOTE — Progress Notes (Signed)
[facility] [place] NAME: Alan Small    MR#:  [id number]  DATE OF BIRTH:  12/12/1955  SUBJECTIVE:  CHIEF COMPLAINT:   Chief Complaint  Patient presents with  . Hypoglycemia   On D 10 at 75 ml/hr  REVIEW OF SYSTEMS:    Review of Systems  Constitutional: Negative for chills and fever.  HENT: Negative for sore throat.   Eyes: Negative for blurred vision, double vision and pain.  Respiratory: Negative for cough, hemoptysis, shortness of breath and wheezing.   Cardiovascular: Negative for chest pain, palpitations, orthopnea and leg swelling.  Gastrointestinal: Negative for abdominal pain, constipation, diarrhea, heartburn, nausea and vomiting.  Genitourinary: Negative for dysuria and hematuria.  Musculoskeletal: Negative for back pain and joint pain.  Skin: Negative for rash.  Neurological: Negative for sensory change, speech change, focal weakness and headaches.  Endo/Heme/Allergies: Does not bruise/bleed easily.  Psychiatric/Behavioral: Negative for depression. The patient is not nervous/anxious.     DRUG ALLERGIES:   Allergies  Allergen Reactions  . Mellaril [Thioridazine] Shortness Of Breath and Swelling  . Buprenorphine Itching  . Morphine And Related Itching  . Navane [Thiothixene] Other (See Comments)    Reaction:  GI upset   . Thorazine [Chlorpromazine] Other (See Comments)    Reaction:  Dizziness/fainting     VITALS:  Blood pressure 128/77, pulse 82, temperature 98.5 F (36.9 C), temperature source Oral, resp. rate 18, height 6' (1.829 m), weight 70.8 kg, SpO2 98 %.  PHYSICAL EXAMINATION:   Physical Exam  GENERAL:  63 y.o.-year-old patient lying in the bed with no acute distress.  EYES: Pupils equal, round, reactive to light and accommodation. No scleral icterus. Extraocular muscles intact.  HEENT: Head atraumatic, normocephalic. Oropharynx and nasopharynx clear.  NECK:  Supple, no jugular venous distention. No  thyroid enlargement, no tenderness.  LUNGS: Normal breath sounds bilaterally, no wheezing, rales, rhonchi. No use of accessory muscles of respiration.  CARDIOVASCULAR: S1, S2 normal. No murmurs, rubs, or gallops.  ABDOMEN: Soft, nontender, nondistended. Bowel sounds present. No organomegaly or mass.  EXTREMITIES: No cyanosis, clubbing or edema b/l.    NEUROLOGIC: Cranial nerves II through XII are intact. No focal Motor or sensory deficits b/l.   PSYCHIATRIC: The patient is alert and oriented x 3.  SKIN: No obvious rash, lesion, or ulcer.   LABORATORY PANEL:   CBC Recent Labs  Lab 08/20/18 1028  WBC 16.6*  HGB 12.9*  HCT 39.3  PLT 130*   ------------------------------------------------------------------------------------------------------------------ Chemistries  Recent Labs  Lab 08/21/18 0551  NA 139  K 4.1  CL 110  CO2 23  GLUCOSE 103*  BUN 24*  CREATININE 1.57*  CALCIUM 8.8*   ------------------------------------------------------------------------------------------------------------------  Cardiac Enzymes No results for input(s): TROPONINI in the last 168 hours. ------------------------------------------------------------------------------------------------------------------  RADIOLOGY:  Ct Head Wo Contrast  Result Date: 08/20/2018 CLINICAL DATA:  63 year old male with hypoglycemia, altered mental status. EXAM: CT HEAD WITHOUT CONTRAST TECHNIQUE: Contiguous axial images were obtained from the base of the skull through the vertex without intravenous contrast. COMPARISON:  Head CT 01/17/2015 and earlier. FINDINGS: Brain: Mild generalized cerebral volume loss since 2016. No midline shift, ventriculomegaly, mass effect, evidence of mass lesion, intracranial hemorrhage or evidence of cortically based acute infarction. Gray-white matter differentiation is within normal limits throughout the brain. Vascular: Mild Calcified atherosclerosis at the skull base. No suspicious  intracranial vascular hyperdensity. Skull: Negative. Sinuses/Orbits: Visualized paranasal sinuses and mastoids are stable and well pneumatized. Other: Negative orbit and  scalp soft tissues. IMPRESSION: Stable and normal for age noncontrast CT appearance of the brain. Electronically Signed   By: Genevie Ann M.D.   On: 08/20/2018 11:33   Ct Renal Stone Study  Result Date: 08/20/2018 CLINICAL DATA:  Flank pain.  Hematuria.  History of stones. EXAM: CT ABDOMEN AND PELVIS WITHOUT CONTRAST TECHNIQUE: Multidetector CT imaging of the abdomen and pelvis was performed following the standard protocol without IV contrast. COMPARISON:  CT scan dated 11/06/2012 and 01/17/2015 FINDINGS: Lower chest: There has been progression of slight interstitial lung disease at the bases posteriorly with tiny bilateral loculated pleural effusions posteriorly at the bases. Heart size is normal. Aortic atherosclerosis. Pericardial effusion. Hepatobiliary: There multiple low-density well-defined lesions in the liver consistent with benign appendix cysts. Several of these have increased in size since the prior exam. The largest is 18 mm in the posterior aspect of the inferior portion of the right lobe. No worrisome lesions. Biliary tree is normal. Pancreas: Unremarkable. No pancreatic ductal dilatation or surrounding inflammatory changes. Spleen: Normal in size without focal abnormality. Adrenals/Urinary Tract: Right nephrectomy. Large staghorn calculus in mid and upper pole of the left kidney. Lobulated 13 mm stone left renal pelvis. No left hydronephrosis. The left ureter appears normal. Bladder appears normal. No discrete bladder calculi. Numerous calcifications in the prostate gland. Stomach/Bowel: Stomach is within normal limits. Appendix has been removed. No evidence of bowel wall thickening, distention, or inflammatory changes. There are a few diverticula in the colon. Vascular/Lymphatic: Aortic atherosclerosis. No enlarged abdominal or  pelvic lymph nodes. Reproductive: Extensive benign calcifications in the prostate gland. Other: No abdominal wall hernia or abnormality. No abdominopelvic ascites. Musculoskeletal: No acute or significant osseous findings. IMPRESSION: 1. New large staghorn calculus in the left kidney as well as a 11 mm stone in the left renal pelvis. 2. No hydronephrosis or other acute abnormality in the abdomen or pelvis. 3.  Aortic Atherosclerosis (ICD10-I70.0). 4. Progressive chronic interstitial lung disease at the bases posteriorly with tiny loculated effusions at each base. Electronically Signed   By: Lorriane Shire M.D.   On: 08/20/2018 13:55     ASSESSMENT AND PLAN:   Ilay Capshaw  is a 63 y.o. male with a known history of adult schizophrenia, gastric ulcer, Gerd, depression, COPD X smoker, hypothyroidism comes to the emergency room from family care home. 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 He received 500 mL of D10. Sugar came up to 156. Patient currently is awake alert.  1. Hypoglycemia - etiology unclear Likely due to acute infection with UTI and missed oral intake. Start D10.  Repeat CBGs every 2 hours. Oral hypoglycemic panel, insulin, C-peptide ordered and pending  2. Left renal pelvic staghorn calculus with UTI Continue Keflex.  Outpatient follow-up with urology.  3. Paranoid schizophrenia -continue home meds  4. [name] continue PPI  5. COPD stable patient states he quit smoking three weeks ago  6. DVT prophylaxis subcu Lovenox  All the records are reviewed and case discussed with Care Management/Social Worker Management plans discussed with the patient, family and they are in agreement.  CODE STATUS: FULL CODE  DVT Prophylaxis: SCDs  TOTAL TIME TAKING CARE OF THIS PATIENT: 35 minutes.   POSSIBLE D/C IN 1-2 DAYS, DEPENDING ON CLINICAL CONDITION.  Neita Carp M.D on 08/22/2018 at 10:22 AM  Between 7am to 6pm - Pager - [phone]  After 6pm go to [email or web address] -  password [email or web address] [facility]  Office  [phone]  CC: Primary care physician; [name], MD  Note: This dictation was prepared with Dragon dictation along with smaller phrase technology. Any transcriptional errors that result from this process are unintentional.

## 2018-08-22 NOTE — NC FL2 (Addendum)
[place] LEVEL OF CARE SCREENING TOOL     IDENTIFICATION  Patient Name: Alan Small Birthdate: 1955-11-27 Sex: male Admission Date (Current Location): 08/20/2018  [place] and [place] Number:  Selena Lesser [id number] [place] and Address:  [facility], [address] [place], [place] [address]      Provider Number: [id number]  Attending Physician Name and Address:  Hillary Bow, MD  Relative Name and Phone Number:  [facility] [place] Robina Ade [phone]    Current Level of Care: Hospital Recommended Level of Care: [place] Prior Approval Number:    Date Approved/Denied:   PASRR Number:    Discharge Plan: Domiciliary (Rest home)([facility])    Current Diagnoses: Patient Active Problem List   Diagnosis Date Noted  . Blood in stool   . Change in bowel habits   . Constipation   . Polyp of sigmoid colon   . Depression 02/26/2016  . COPD (chronic obstructive pulmonary disease) ([place]) 02/03/2016  . Suprapubic catheter ([place]) 12/17/2015  . Urethral stricture 12/17/2015  . Urinary retention 10/27/2015  . Tobacco use disorder 08/27/2015  . Malnutrition of moderate degree 05/09/2015  . Sepsis ([place]) 05/06/2015  . UTI (lower urinary tract infection) 01/17/2015  . Hypoglycemia 01/17/2015  . Gastroesophageal reflux disease 10/28/2014  . Bright red rectal bleeding 10/26/2014  . Chronic constipation 10/26/2014  . Hydronephrosis of left kidney 01/17/2014  . Neurogenic bladder 06/21/2013  . Urolithiasis 03/15/2013  . Nephrolithiasis 03/04/2013  . Metabolic acidosis 35/57/3220  . Acute renal failure ([place]) 11/03/2012  . Hyperkalemia 11/03/2012  . Pyelonephritis 11/03/2012  . Schizophrenia ([place]) 11/03/2012  . Hypothyroidism 11/03/2012  . Severe sepsis ([place]) 11/03/2012  . Protein-calorie malnutrition, severe ([place]) 11/03/2012  . Encephalopathy acute 11/03/2012  . Chronic kidney disease, stage IV (severe)  ([place]) 07/02/2012  . 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 Kidney stone 04/13/2012    Orientation RESPIRATION BLADDER Height & Weight     Self, Time, Situation, Place  Normal Continent Weight: 156 lb (70.8 kg) Height:  6' (182.9 cm)  BEHAVIORAL SYMPTOMS/MOOD NEUROLOGICAL BOWEL NUTRITION STATUS Regular      Continent    AMBULATORY STATUS COMMUNICATION OF NEEDS Skin   Supervision Verbally Normal                       Personal Care Assistance Level of Assistance  Bathing, Feeding, Dressing Bathing Assistance: Independent Feeding assistance: Independent Dressing Assistance: Independent     Functional Limitations Info  Sight, Hearing, Speech Sight Info: Adequate Hearing Info: Adequate Speech Info: Adequate    SPECIAL CARE FACTORS FREQUENCY                       Contractures Contractures Info: Not present    Additional Factors Info  Code Status, Allergies, Psychotropic Code Status Info: Full Allergies Info: Mellaril Thioridazine, Buprenorphine, Morphine And Related, Navane Thiothixene, Thorazine Chlorpromazine Psychotropic Info: [place]            Discharge Medications: Medication List    TAKE these medications   acetaminophen 500 MG tablet Commonly known as:  TYLENOL Take 1,000 mg by mouth 2 (two) times daily as needed for mild pain, moderate pain or fever.   cephALEXin 500 MG capsule Commonly known as:  KEFLEX Take 1 capsule (500 mg total) by mouth every 12 (twelve) hours.   Combivent 18-103 MCG/ACT inhaler Generic drug:  albuterol-ipratropium Inhale 1 puff into the lungs every  6 (six) hours as needed for wheezing or shortness of breath.   FLUoxetine 20 MG capsule Commonly known as:  PROZAC Take 60 mg by mouth every morning.   hydrocortisone 2.5 % rectal cream Commonly known as:  ANUSOL-HC Place rectally 2 (two) times daily. What changed:    how much to take  when to take this  reasons to take this  additional instructions    hydrocortisone 25 MG suppository Commonly known as:  ANUSOL-HC Place 25 mg rectally 2 (two) times daily.   Invega Sustenna 156 MG/ML Susy injection Generic drug:  paliperidone Inject 156 mg into the muscle every 30 (thirty) days.   levothyroxine 50 MCG tablet Commonly known as:  SYNTHROID, LEVOTHROID Take 50 mcg by mouth daily before breakfast.   nystatin powder Commonly known as:  MYCOSTATIN/NYSTOP Apply topically 2 (two) times daily.   opium-belladonna 16.2-60 MG suppository Commonly known as:  B&O SUPPRETTES Place 1 suppository rectally every 6 (six) hours as needed for bladder spasms.   paliperidone 6 MG 24 hr tablet Commonly known as:  INVEGA Take 6 mg by mouth at bedtime.   polyethylene glycol 236 g solution Commonly known as:  Golytely Drink one 8 oz glass every 20 mins until stools are clear.   sodium bicarbonate 650 MG tablet Take 650 mg by mouth 2 (two) times daily.   sodium chloride 0.9 % infusion Inject 10 mLs into the vein 2 (two) times daily. *Infuse in catheter*   Symbicort 160-4.5 MCG/ACT inhaler Generic drug:  budesonide-formoterol Inhale 1 puff into the lungs 2 (two) times daily.   tamsulosin 0.4 MG Caps capsule Commonly known as:  FLOMAX Take 0.4 mg by mouth every morning.    Addendum  hydrocortisone 2.5 % rectal cream Commonly known as:  ANUSOL-HC Place rectally 2 (two) times daily PRN  Combivent 20-100 MCG/ACT inhaler Generic drug:  albuterol-ipratropium Inhale 1 puff into the lungs every 6 (six) hours as needed for wheezing or shortness of breath.  hydrocortisone 25 MG suppository Commonly known as:  ANUSOL-HC Place 25 mg rectally 2 (two) times daily PRN  Discontinue  OPIUM BELLADONA SODIUM CHLORIDE INFUSION Paliperidone   Relevant Imaging Results:  Relevant Lab Results:   Additional Information SS#[id number]  Zettie Pho, LCSW

## 2018-08-22 NOTE — Discharge Summary (Addendum)
[name] [place] NAME: Alan Small    MR#:  [id number]  DATE OF BIRTH:  1955-12-01  DATE OF ADMISSION:  08/20/2018 ADMITTING PHYSICIAN: Fritzi Mandes, MD  DATE OF DISCHARGE: 08/22/2018  PRIMARY CARE PHYSICIAN: [name], MD   ADMISSION DIAGNOSIS:  Hypoglycemia ems  DISCHARGE DIAGNOSIS:  Active Problems:   Hypoglycemia   SECONDARY DIAGNOSIS:   Past Medical History:  Diagnosis Date  . Asthma   . COPD (chronic obstructive pulmonary disease) ([place])   . Depression   . External hemorrhoids   . Gastric ulcer   . GERD (gastroesophageal reflux disease)   . Hydronephrosis   . Hypothyroidism    hypo  . Insomnia   . Mental disorder   . Myocardial infarction ([place])   . Paranoid schizophrenia ([place])   . Renal disorder    renal failure  . Seizures ([place])   . Stroke [facility])      ADMITTING HISTORY  HISTORY OF PRESENT ILLNESS:  Alan Small  is a 63 y.o. male with a known history of adult schizophrenia, gastric ulcer, Gerd, depression, COPD X smoker, hypothyroidism comes to the emergency room from family care home. 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 He received 500 mL of D10. Sugar came up to 156. Patient currently is awake alert. He states he is been eating okay. No recent fever nausea vomiting or any other illness prohibiting him from eating.  No family in the ER. Patient is being admitted for hyperglycemia. He is currently on D10 drip. Most recent sugar was 48.  HOSPITAL COURSE:   ClydeArnoldis a24 y.o.malewith a known history of adult schizophrenia, gastric ulcer, Gerd, depression, COPD X smoker, hypothyroidism comes to the emergency room from family care home. 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 He received 500 mL of D10. Sugar came up to 156. Patient currently is awake alert.  1.Hypoglycemia - etiology unclear Likely due to acute infection with UTI and missed oral intake. Patient is off D10. Oral  hypoglycemic panel, insulin, C-peptide ordered and pending His blood sugars have been consistently over 130 for 24 hours.  2.Left renal pelvic staghorn calculus with UTI Continue Keflex for 3 more days.  Outpatient follow-up with urology.  3.Paranoid schizophrenia -continue home meds  4.[name] continue PPI  5.COPD stable patient states he quit smoking three weeks ago  6.DVT prophylaxis subcu Lovenox in the hospital  Patient stable for discharge.  CONSULTS OBTAINED:    DRUG ALLERGIES:   Allergies  Allergen Reactions  . Mellaril [Thioridazine] Shortness Of Breath and Swelling  . Buprenorphine Itching  . Morphine And Related Itching  . Navane [Thiothixene] Other (See Comments)    Reaction:  GI upset   . Thorazine [Chlorpromazine] Other (See Comments)    Reaction:  Dizziness/fainting     DISCHARGE MEDICATIONS:   Allergies as of 08/22/2018      Reactions   Mellaril [thioridazine] Shortness Of Breath, Swelling   Buprenorphine Itching   Morphine And Related Itching   Navane [thiothixene] Other (See Comments)   Reaction:  GI upset    Thorazine [chlorpromazine] Other (See Comments)   Reaction:  Dizziness/fainting       Medication List    TAKE these medications   acetaminophen 500 MG tablet Commonly known as:  TYLENOL Take 1,000 mg by mouth 2 (two) times daily as needed for mild pain, moderate pain or fever.   cephALEXin 500 MG capsule Commonly known  as:  KEFLEX Take 1 capsule (500 mg total) by mouth every 12 (twelve) hours.   Combivent 18-103 MCG/ACT inhaler Generic drug:  albuterol-ipratropium Inhale 1 puff into the lungs every 6 (six) hours as needed for wheezing or shortness of breath.   FLUoxetine 20 MG capsule Commonly known as:  PROZAC Take 60 mg by mouth every morning.   hydrocortisone 2.5 % rectal cream Commonly known as:  ANUSOL-HC Place rectally 2 (two) times daily. What changed:    how much to take  when to take this  reasons to take  this  additional instructions   hydrocortisone 25 MG suppository Commonly known as:  ANUSOL-HC Place 25 mg rectally 2 (two) times daily.   Invega Sustenna 156 MG/ML Susy injection Generic drug:  paliperidone Inject 156 mg into the muscle every 30 (thirty) days.   levothyroxine 50 MCG tablet Commonly known as:  SYNTHROID, LEVOTHROID Take 50 mcg by mouth daily before breakfast.   nystatin powder Commonly known as:  MYCOSTATIN/NYSTOP Apply topically 2 (two) times daily.   opium-belladonna 16.2-60 MG suppository Commonly known as:  B&O SUPPRETTES Place 1 suppository rectally every 6 (six) hours as needed for bladder spasms.   paliperidone 6 MG 24 hr tablet Commonly known as:  INVEGA Take 6 mg by mouth at bedtime.   polyethylene glycol 236 g solution Commonly known as:  Golytely Drink one 8 oz glass every 20 mins until stools are clear.   sodium bicarbonate 650 MG tablet Take 650 mg by mouth 2 (two) times daily.   sodium chloride 0.9 % infusion Inject 10 mLs into the vein 2 (two) times daily. *Infuse in catheter*   Symbicort 160-4.5 MCG/ACT inhaler Generic drug:  budesonide-formoterol Inhale 1 puff into the lungs 2 (two) times daily.   tamsulosin 0.4 MG Caps capsule Commonly known as:  FLOMAX Take 0.4 mg by mouth every morning.      Addendum  hydrocortisone 2.5 % rectal cream Commonly known as:  ANUSOL-HC Place rectally 2 (two) times daily PRN  Combivent 20-100 MCG/ACT inhaler Generic drug:  albuterol-ipratropium Inhale 1 puff into the lungs every 6 (six) hours as needed for wheezing or shortness of breath.  hydrocortisone 25 MG suppository Commonly known as:  ANUSOL-HC Place 25 mg rectally 2 (two) times daily PRN  Discontinue  OPIUM BELLADONA SODIUM CHLORIDE INFUSION Paliperidone   Today   VITAL SIGNS:  Blood pressure 128/77, pulse 82, temperature 98.5 F (36.9 C), temperature source Oral, resp. rate 18, height 6' (1.829 m), weight 70.8 kg, SpO2 98  %.  I/O:    Intake/Output Summary (Last 24 hours) at 08/22/2018 1028 Last data filed at 08/22/2018 0733 Gross per 24 hour  Intake -  Output 3250 ml  Net -3250 ml    PHYSICAL EXAMINATION:  Physical Exam  GENERAL:  63 y.o.-year-old patient lying in the bed with no acute distress.  LUNGS: Normal breath sounds bilaterally, no wheezing, rales,rhonchi or crepitation. No use of accessory muscles of respiration.  CARDIOVASCULAR: S1, S2 normal. No murmurs, rubs, or gallops.  ABDOMEN: Soft, non-tender, non-distended. Bowel sounds present. No organomegaly or mass.  NEUROLOGIC: Moves all 4 extremities. PSYCHIATRIC: The patient is alert and oriented x 3.  SKIN: No obvious rash, lesion, or ulcer.   DATA REVIEW:   CBC Recent Labs  Lab 08/20/18 1028  WBC 16.6*  HGB 12.9*  HCT 39.3  PLT 130*    Chemistries  Recent Labs  Lab 08/21/18 0551  NA 139  K 4.1  CL 110  CO2 23  GLUCOSE 103*  BUN 24*  CREATININE 1.57*  CALCIUM 8.8*    Cardiac Enzymes No results for input(s): TROPONINI in the last 168 hours.  Microbiology Results  Results for orders placed or performed during the hospital encounter of 08/20/18  Urine culture     Status: Abnormal (Preliminary result)   Collection Time: 08/20/18 11:26 AM  Result Value Ref Range Status   Specimen Description   Final    URINE, RANDOM Performed at [facility], [address], [place], [place] [address]    Special Requests   Final    NONE Performed at [facility], [address], [place], [place] [address]    Culture >=100,000 COLONIES/mL GRAM NEGATIVE RODS (A)  Final   Report Status PENDING  Incomplete  Culture, blood (routine x 2)     Status: None (Preliminary result)   Collection Time: 08/20/18  2:46 PM  Result Value Ref Range Status   Specimen Description BLOOD LAC  Final   Special Requests   Final    BOTTLES DRAWN AEROBIC AND ANAEROBIC Blood Culture results may not be optimal due to an excessive volume  of blood received in culture bottles   Culture   Final    NO GROWTH 2 DAYS Performed at [facility], [address] [place], [place] [address]    Report Status PENDING  Incomplete  Culture, blood (routine x 2)     Status: None (Preliminary result)   Collection Time: 08/20/18  2:46 PM  Result Value Ref Range Status   Specimen Description BLOOD R HAND  Final   Special Requests   Final    BOTTLES DRAWN AEROBIC AND ANAEROBIC Blood Culture results may not be optimal due to an inadequate volume of blood received in culture bottles   Culture   Final    NO GROWTH 2 DAYS Performed at [facility], [address], [place],  [address]    Report Status PENDING  Incomplete    RADIOLOGY:  Ct Head Wo Contrast  Result Date: 08/20/2018 CLINICAL DATA:  63 year old male with hypoglycemia, altered mental status. EXAM: CT HEAD WITHOUT CONTRAST TECHNIQUE: Contiguous axial images were obtained from the base of the skull through the vertex without intravenous contrast. COMPARISON:  Head CT 01/17/2015 and earlier. FINDINGS: Brain: Mild generalized cerebral volume loss since 2016. No midline shift, ventriculomegaly, mass effect, evidence of mass lesion, intracranial hemorrhage or evidence of cortically based acute infarction. Gray-white matter differentiation is within normal limits throughout the brain. Vascular: Mild Calcified atherosclerosis at the skull base. No suspicious intracranial vascular hyperdensity. Skull: Negative. Sinuses/Orbits: Visualized paranasal sinuses and mastoids are stable and well pneumatized. Other: Negative orbit and scalp soft tissues. IMPRESSION: Stable and normal for age noncontrast CT appearance of the brain. Electronically Signed   By: Genevie Ann M.D.   On: 08/20/2018 11:33   Ct Renal Stone Study  Result Date: 08/20/2018 CLINICAL DATA:  Flank pain.  Hematuria.  History of stones. EXAM: CT ABDOMEN AND PELVIS WITHOUT CONTRAST TECHNIQUE: Multidetector CT imaging  of the abdomen and pelvis was performed following the standard protocol without IV contrast. COMPARISON:  CT scan dated 11/06/2012 and 01/17/2015 FINDINGS: Lower chest: There has been progression of slight interstitial lung disease at the bases posteriorly with tiny bilateral loculated pleural effusions posteriorly at the bases. Heart size is normal. Aortic atherosclerosis. Pericardial effusion. Hepatobiliary: There multiple low-density well-defined lesions in the liver consistent with benign appendix cysts. Several of these have increased  in size since the prior exam. The largest is 18 mm in the posterior aspect of the inferior portion of the right lobe. No worrisome lesions. Biliary tree is normal. Pancreas: Unremarkable. No pancreatic ductal dilatation or surrounding inflammatory changes. Spleen: Normal in size without focal abnormality. Adrenals/Urinary Tract: Right nephrectomy. Large staghorn calculus in mid and upper pole of the left kidney. Lobulated 13 mm stone left renal pelvis. No left hydronephrosis. The left ureter appears normal. Bladder appears normal. No discrete bladder calculi. Numerous calcifications in the prostate gland. Stomach/Bowel: Stomach is within normal limits. Appendix has been removed. No evidence of bowel wall thickening, distention, or inflammatory changes. There are a few diverticula in the colon. Vascular/Lymphatic: Aortic atherosclerosis. No enlarged abdominal or pelvic lymph nodes. Reproductive: Extensive benign calcifications in the prostate gland. Other: No abdominal wall hernia or abnormality. No abdominopelvic ascites. Musculoskeletal: No acute or significant osseous findings. IMPRESSION: 1. New large staghorn calculus in the left kidney as well as a 11 mm stone in the left renal pelvis. 2. No hydronephrosis or other acute abnormality in the abdomen or pelvis. 3.  Aortic Atherosclerosis (ICD10-I70.0). 4. Progressive chronic interstitial lung disease at the bases posteriorly  with tiny loculated effusions at each base. Electronically Signed   By: Lorriane Shire M.D.   On: 08/20/2018 13:55    Follow up with PCP in 1 week.  Management plans discussed with the patient, family and they are in agreement.  CODE STATUS:     Code Status Orders  (From admission, onward)         Start     Ordered   08/20/18 1730  Full code  Continuous     08/20/18 1729        Code Status History    Date Active Date Inactive Code Status Order ID Comments User Context   02/26/2016 1748 02/28/2016 2109 Full Code [id number]  Henreitta Leber, MD Inpatient   05/06/2015 1856 05/15/2015 0115 Full Code [id number]  Henreitta Leber, MD Inpatient   01/17/2015 1206 01/22/2015 1645 Full Code [id number]  Hillary Bow, MD ED   11/03/2012 0300 11/11/2012 2134 Full Code [id number]  Rise Patience, MD Inpatient      TOTAL TIME TAKING CARE OF THIS PATIENT ON DAY OF DISCHARGE: more than 30 minutes.   Leia Alf Bonna Steury M.D on 08/22/2018 at 10:28 AM  Between 7am to 6pm - Pager - [phone]  After 6pm go to [email or web address] - password [email or web address] [facility]  Office  [phone]  CC: Primary care physician; [name], MD  Note: This dictation was prepared with Dragon dictation along with smaller phrase technology. Any transcriptional errors that result from this process are unintentional.

## 2018-08-22 NOTE — TOC Transition Note (Signed)
Transition of Care [facility]) - CM/SW Discharge Note   Patient Details  Name: Alan Small MRN: [id number] Date of Birth: Apr 29, 1956  [facility] CM/SW Contact:  Zettie Pho, LCSW Phone Number: 08/22/2018, 10:55 AM   Clinical Narrative:   The CSW contacted the on-call APS worker to advise that the patient will discharge back to his [facility] today. The APS worker gave permission for any mode of transport indicated that is deemed safe. The CSW called [facility], and the representative shared that no one is able to transport the patient due to limited staff. The CSW will provide a taxi voucher as the patient does not meet medical necessity for EMS. The CSW provided an update as to discharge medications for the patient, and the representative requested that discharge paperwork (FL 2 and summary) be faxed to the facility.  The CSW will fax discharge documentation once the FL 2 is available. The CSW will deliver the discharge packet after that time and sign off. Please consult should needs arise.    Final next level of care: Group Home Barriers to Discharge: No Barriers Identified   Patient Goals and CMS Choice Patient states their goals for this hospitalization and ongoing recovery are:: The legal guardian would like to ensure stable blood sugars. CMS Medicare.gov Compare Post Acute Care list provided to:: Other (Comment Required)(N/A) Choice offered to / list presented to : NA  Discharge Placement              Patient chooses bed at: (N/A) Patient to be transferred to facility by: Taxi Name of family member notified: [name] and [facility] Patient and family notified of of transfer: 08/22/18  Discharge Plan and Services  The patient is discharging back to [facility] and [facility] with no [place] needed. The patient has new medication (Keflex), and his [facility] is aware as is his legal guardian representative.                        Social Determinants of Health (SDOH)  Interventions     Readmission Risk Interventions No flowsheet data found.

## 2018-08-23 ENCOUNTER — Telehealth: Payer: Self-pay | Admitting: Urology

## 2018-08-23 LAB — URINE CULTURE: Culture: >=100000 — AB

## 2018-08-23 NOTE — Telephone Encounter (Signed)
Alan Small from [place] [phone]) called the office today to schedule an appointment with our office for ER follow up.    Please take a look at the chart and advise on how we should proceed.  Thank you.

## 2018-08-23 NOTE — Telephone Encounter (Signed)
This patient was previously seen and operated on multiple times at [facility] urology.  If you would like to establish care with [place], that would be fine but this is not an emergent issue.  He has a chronic large staghorn calculus which will need surgical intervention but given that all elective cases are on hold, this does not need to be an urgent follow-up.  Please schedule him out several months in the office.  Hollice Espy, MD

## 2018-08-24 NOTE — Telephone Encounter (Signed)
Information provided to [facility] at [facility].  She expressed understanding.

## 2018-08-24 NOTE — Telephone Encounter (Signed)
Per Dr. Erlene Quan:  This patient was previously seen and operated on multiple times at [facility] urology.  If you would like to establish care with [place], that would be fine but this is not an emergent issue.  He has a chronic large staghorn calculus which will need surgical intervention but given that all elective cases are on hold, this does not need to be an urgent follow-up.  Please schedule him out several months in the office.  Hollice Espy, MD  Information provided to [facility] [place] (Ph# [phone], fax # [phone]).

## 2018-08-25 LAB — CULTURE, BLOOD (ROUTINE X 2)
Culture: NO GROWTH
Culture: NO GROWTH

## 2018-08-31 LAB — SULFONYLUREA HYPOGLYCEMICS PANEL, SERUM
Acetohexamide: NEGATIVE ug/mL (ref 20–60)
Acetohexamide: NEGATIVE ug/mL (ref 20–60)
Chlorpropamide: NEGATIVE ug/mL (ref 75–250)
Chlorpropamide: NEGATIVE ug/mL (ref 75–250)
GLIPIZIDE: 78 ng/mL — AB (ref 200–1000)
Glimepiride: NEGATIVE ng/mL (ref 80–250)
Glimepiride: NEGATIVE ng/mL (ref 80–250)
Glipizide: 16 ng/mL — ABNORMAL LOW (ref 200–1000)
Glyburide: NEGATIVE ng/mL
Glyburide: NEGATIVE ng/mL
Nateglinide: NEGATIVE ng/mL
Nateglinide: NEGATIVE ng/mL
Repaglinide: NEGATIVE ng/mL
Repaglinide: NEGATIVE ng/mL
TOLAZAMIDE: NEGATIVE ug/mL
Tolazamide: NEGATIVE ug/mL
Tolbutamide: NEGATIVE ug/mL (ref 40–100)
Tolbutamide: NEGATIVE ug/mL (ref 40–100)

## 2018-10-27 ENCOUNTER — Other Ambulatory Visit: Payer: Self-pay

## 2018-12-01 ENCOUNTER — Other Ambulatory Visit: Payer: Self-pay

## 2018-12-01 ENCOUNTER — Ambulatory Visit (INDEPENDENT_AMBULATORY_CARE_PROVIDER_SITE_OTHER): Payer: Medicare Other | Admitting: Urology

## 2018-12-01 ENCOUNTER — Encounter: Payer: Self-pay | Admitting: Urology

## 2018-12-01 VITALS — BP 121/70 | HR 101 | Ht 72.0 in | Wt 174.0 lb

## 2018-12-01 DIAGNOSIS — N183 Chronic kidney disease, stage 3 unspecified: Secondary | ICD-10-CM

## 2018-12-01 DIAGNOSIS — R8271 Bacteriuria: Secondary | ICD-10-CM | POA: Diagnosis not present

## 2018-12-01 DIAGNOSIS — N35119 Postinfective urethral stricture, not elsewhere classified, male, unspecified: Secondary | ICD-10-CM | POA: Diagnosis not present

## 2018-12-01 DIAGNOSIS — N200 Calculus of kidney: Secondary | ICD-10-CM

## 2018-12-01 LAB — URINALYSIS, COMPLETE
Bilirubin, UA: NEGATIVE
Glucose, UA: NEGATIVE
Ketones, UA: NEGATIVE
Nitrite, UA: POSITIVE — AB
Specific Gravity, UA: 1.02 (ref 1.005–1.030)
Urobilinogen, Ur: 0.2 mg/dL (ref 0.2–1.0)
pH, UA: 7 (ref 5.0–7.5)

## 2018-12-01 LAB — MICROSCOPIC EXAMINATION
Epithelial Cells (non renal): >10 /hpf — AB (ref 0–10)
RBC, Urine: >30 /hpf — AB (ref 0–2)
Renal Epithel, UA: >10 /hpf — AB
WBC, UA: >30 /hpf — AB (ref 0–5)

## 2018-12-01 LAB — BLADDER SCAN AMB NON-IMAGING

## 2018-12-01 NOTE — Progress Notes (Signed)
12/01/2018 10:42 AM   Alan Small 1956/04/08 [id number]  Referring provider: [name], MD [name],  [place] [address]  Chief Complaint  Patient presents with  . Hydronephrosis    New Patient    HPI: 63 year old male with complex GU history who presents today as follow-up from a recent admission in 08/2018.  He has been followed by [facility] urology for quite some time.  He has an extensive GI history including a history of right nephrectomy for an atrophic kidney, urethral stricture status post urethroplasty, and left intraparenchymal/partial staghorn calcifications.  More recently, he was admitted to [facility] on 08/2018 for an episode of hypoglycemia.  He was noted to have a positive UA/urine culture and as part of his discharge care planning, an appointment was made with [facility] despite having an established urologist.  The patient presents today accompanied by a caretaker.  He reports that he would prefer to continue his care with [facility] urology and is unsure why was made.  Stay including no dysuria or gross hematuria.  His UA is grossly positive which appears to be chronic.  He does have baseline CKD with his most recent admission, his creatinine was in fact improved from his baseline at 1.39.  In addition to the above, he had a CT scan performed in the emergency room at [facility] on 08/08/2018 which showed left partial staghorn calculus as well as a approximately 12 mm left renal pelvic stone.  The patient reports that he is known that he has large stones in his left kidney multiple imaging where the stones are being followed.  He denies flank pain.   PMH: Past Medical History:  Diagnosis Date  . Asthma   . COPD (chronic obstructive pulmonary disease) ([place])   . Depression   . External hemorrhoids   . Gastric ulcer   . GERD (gastroesophageal reflux disease)   . Hydronephrosis   . Hypothyroidism    hypo  . Insomnia   . Mental  disorder   . Myocardial infarction ([place])   . Paranoid schizophrenia ([place])   . Renal disorder    renal failure  . Seizures ([place])   . Stroke [facility])     Surgical History: Past Surgical History:  Procedure Laterality Date  . APPENDECTOMY    . COLONOSCOPY WITH PROPOFOL N/A 12/07/2014   Procedure: COLONOSCOPY WITH PROPOFOL;  Surgeon: Manya Silvas, MD;  Location: [facility] ENDOSCOPY;  Service: Endoscopy;  Laterality: N/A;  . COLONOSCOPY WITH PROPOFOL N/A 10/10/2016   Procedure: COLONOSCOPY WITH PROPOFOL;  Surgeon: Lucilla Lame, MD;  Location: [place];  Service: Endoscopy;  Laterality: N/A;  . ESOPHAGOGASTRODUODENOSCOPY N/A 12/07/2014   Procedure: ESOPHAGOGASTRODUODENOSCOPY (EGD);  Surgeon: Manya Silvas, MD;  Location: [facility] ENDOSCOPY;  Service: Endoscopy;  Laterality: N/A;  . NEPHRECTOMY    . POLYPECTOMY  10/10/2016   Procedure: POLYPECTOMY;  Surgeon: Lucilla Lame, MD;  Location: [place];  Service: Endoscopy;;  . SUPRAPUBIC CATHETER INSERTION      Home Medications:  Allergies as of 12/01/2018      Reactions   Mellaril [thioridazine] Shortness Of Breath, Swelling   Buprenorphine Itching   Morphine And Related Itching   Navane [thiothixene] Other (See Comments)   Reaction:  GI upset    Thorazine [chlorpromazine] Other (See Comments)   Reaction:  Dizziness/fainting       Medication List       Accurate as of December 01, 2018 11:59 PM. If you have  any questions, ask your nurse or doctor.        STOP taking these medications   cephALEXin 500 MG capsule Commonly known as: KEFLEX Stopped by: Hollice Espy, MD   hydrocortisone 2.5 % rectal cream Commonly known as: ANUSOL-HC Stopped by: Hollice Espy, MD   hydrocortisone 25 MG suppository Commonly known as: ANUSOL-HC Stopped by: Hollice Espy, MD   opium-belladonna 16.2-60 MG suppository Commonly known as: B&O SUPPRETTES Stopped by: Hollice Espy, MD     TAKE these medications   acetaminophen 500 MG  tablet Commonly known as: TYLENOL Take 1,000 mg by mouth 2 (two) times daily as needed for mild pain, moderate pain or fever.   Combivent 18-103 MCG/ACT inhaler Generic drug: albuterol-ipratropium Inhale 1 puff into the lungs every 6 (six) hours as needed for wheezing or shortness of breath.   FLUoxetine 20 MG capsule Commonly known as: PROZAC Take 60 mg by mouth every morning.   Invega Sustenna 156 MG/ML Susy injection Generic drug: paliperidone Inject 156 mg into the muscle every 30 (thirty) days.   levothyroxine 50 MCG tablet Commonly known as: SYNTHROID Take 50 mcg by mouth daily before breakfast.   nystatin powder Commonly known as: MYCOSTATIN/NYSTOP Apply topically 2 (two) times daily.   paliperidone 6 MG 24 hr tablet Commonly known as: INVEGA Take 6 mg by mouth at bedtime.   polyethylene glycol 236 g solution Commonly known as: Golytely Drink one 8 oz glass every 20 mins until stools are clear.   sodium bicarbonate 650 MG tablet Take 650 mg by mouth 2 (two) times daily.   sodium chloride 0.9 % infusion Inject 10 mLs into the vein 2 (two) times daily. *Infuse in catheter*   Symbicort 160-4.5 MCG/ACT inhaler Generic drug: budesonide-formoterol Inhale 1 puff into the lungs 2 (two) times daily.   tamsulosin 0.4 MG Caps capsule Commonly known as: FLOMAX Take 0.4 mg by mouth every morning.       Allergies:  Allergies  Allergen Reactions  . Mellaril [Thioridazine] Shortness Of Breath and Swelling  . Buprenorphine Itching  . Morphine And Related Itching  . Navane [Thiothixene] Other (See Comments)    Reaction:  GI upset   . Thorazine [Chlorpromazine] Other (See Comments)    Reaction:  Dizziness/fainting     Family History: Family History  Problem Relation Age of Onset  . Stroke Father   . Pneumonia Father   . Brain cancer Mother     Social History:  reports that he has been smoking cigarettes. He has a 55.00 pack-year smoking history. He has quit  using smokeless tobacco.  His smokeless tobacco use included chew. He reports that he does not drink alcohol or use drugs.  ROS: UROLOGY Frequent Urination?: No Hard to postpone urination?: No Burning/pain with urination?: No Get up at night to urinate?: No Leakage of urine?: No Urine stream starts and stops?: No Trouble starting stream?: No Do you have to strain to urinate?: No Blood in urine?: No Urinary tract infection?: No Sexually transmitted disease?: No Injury to kidneys or bladder?: No Painful intercourse?: No Weak stream?: No Erection problems?: No Penile pain?: No  Gastrointestinal Nausea?: No Vomiting?: No Indigestion/heartburn?: No Diarrhea?: No Constipation?: No  Constitutional Fever: No Night sweats?: No Weight loss?: No Fatigue?: No  Skin Skin rash/lesions?: Yes Itching?: Yes  Eyes Blurred vision?: No Double vision?: No  Ears/Nose/Throat Sore throat?: No Sinus problems?: No  Hematologic/Lymphatic Swollen glands?: No Easy bruising?: No  Cardiovascular Leg swelling?: No Chest pain?: No  Respiratory Cough?: No Shortness of breath?: No  Endocrine Excessive thirst?: No  Musculoskeletal Back pain?: No Joint pain?: No  Neurological Headaches?: No Dizziness?: No  Psychologic Depression?: No Anxiety?: No  Physical Exam: BP 121/70   Pulse (!) 101   Ht 6' (1.829 m)   Wt 174 lb (78.9 kg)   BMI 23.60 kg/m   Constitutional:  Alert and oriented, No acute distress.  Accompanied by caretaker today.  Pleasant. HEENT: [place] AT, moist mucus membranes.  Trachea midline, no masses. Cardiovascular: No clubbing, cyanosis, or edema. Respiratory: Normal respiratory effort, no increased work of breathing. Skin: No rashes, bruises or suspicious lesions. Neurologic: Grossly intact, no focal deficits, moving all 4 extremities. Psychiatric: Normal mood and affect.  Laboratory Data: Lab Results  Component Value Date   WBC 16.6 (H) 08/20/2018    HGB 12.9 (L) 08/20/2018   HCT 39.3 08/20/2018   MCV 94.7 08/20/2018   PLT 130 (L) 08/20/2018    Lab Results  Component Value Date   CREATININE 1.57 (H) 08/21/2018    Urinalysis    Component Value Date/Time   COLORURINE YELLOW (A) 08/20/2018 1126   APPEARANCEUR Cloudy (A) 12/01/2018 0927   LABSPEC 1.011 08/20/2018 1126   LABSPEC 1.010 02/16/2014 1650   PHURINE 7.0 08/20/2018 1126   GLUCOSEU Negative 12/01/2018 0927   GLUCOSEU Negative 02/16/2014 1650   HGBUR LARGE (A) 08/20/2018 1126   BILIRUBINUR Negative 12/01/2018 0927   BILIRUBINUR Negative 02/16/2014 1650   KETONESUR NEGATIVE 08/20/2018 1126   PROTEINUR 2+ (A) 12/01/2018 0927   PROTEINUR NEGATIVE 08/20/2018 1126   UROBILINOGEN 0.2 11/07/2012 1049   NITRITE Positive (A) 12/01/2018 0927   NITRITE POSITIVE (A) 08/20/2018 1126   LEUKOCYTESUR 3+ (A) 12/01/2018 0927   LEUKOCYTESUR LARGE (A) 08/20/2018 1126   LEUKOCYTESUR 3+ 02/16/2014 1650    Lab Results  Component Value Date   LABMICR See below: 12/01/2018   WBCUA >30 (A) 12/01/2018   LABEPIT >10 (A) 12/01/2018   BACTERIA Many (A) 12/01/2018    Pertinent Imaging: Results for orders placed during the hospital encounter of 08/20/18  CT Renal Stone Study   Narrative CLINICAL DATA:  Flank pain.  Hematuria.  History of stones.  EXAM: CT ABDOMEN AND PELVIS WITHOUT CONTRAST  TECHNIQUE: Multidetector CT imaging of the abdomen and pelvis was performed following the standard protocol without IV contrast.  COMPARISON:  CT scan dated 11/06/2012 and 01/17/2015  FINDINGS: Lower chest: There has been progression of slight interstitial lung disease at the bases posteriorly with tiny bilateral loculated pleural effusions posteriorly at the bases. Heart size is normal. Aortic atherosclerosis. Pericardial effusion.  Hepatobiliary: There multiple low-density well-defined lesions in the liver consistent with benign appendix cysts. Several of these have increased in size  since the prior exam. The largest is 18 mm in the posterior aspect of the inferior portion of the right lobe. No worrisome lesions. Biliary tree is normal.  Pancreas: Unremarkable. No pancreatic ductal dilatation or surrounding inflammatory changes.  Spleen: Normal in size without focal abnormality.  Adrenals/Urinary Tract: Right nephrectomy. Large staghorn calculus in mid and upper pole of the left kidney. Lobulated 13 mm stone left renal pelvis. No left hydronephrosis. The left ureter appears normal. Bladder appears normal. No discrete bladder calculi. Numerous calcifications in the prostate gland.  Stomach/Bowel: Stomach is within normal limits. Appendix has been removed. No evidence of bowel wall thickening, distention, or inflammatory changes. There are a few diverticula in the colon.  Vascular/Lymphatic: Aortic atherosclerosis. No enlarged abdominal or  pelvic lymph nodes.  Reproductive: Extensive benign calcifications in the prostate gland.  Other: No abdominal wall hernia or abnormality. No abdominopelvic ascites.  Musculoskeletal: No acute or significant osseous findings.  IMPRESSION: 1. New large staghorn calculus in the left kidney as well as a 11 mm stone in the left renal pelvis. 2. No hydronephrosis or other acute abnormality in the abdomen or pelvis. 3.  Aortic Atherosclerosis (ICD10-I70.0). 4. Progressive chronic interstitial lung disease at the bases posteriorly with tiny loculated effusions at each base.   Electronically Signed   By: Lorriane Shire M.D.   On: 08/20/2018 13:55    CT scan imaging personally reviewed today- agree with radiological interpretation.    Assessment & Plan:    1. Bacteriuria, chronic UA appears to be despite being asymptomatic Recommend only treating when he becomes symptomatic  2. 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 Francesca Jewett 2018 indicates urethral  patent - Urinalysis, Complete - Bladder Scan (Post Void Residual) in office  3. CKD (chronic kidney disease) stage 3, GFR 30-59 ml/min (HCC) Most recent creatinine in fact improved Followed by [facility] nephrology  4. Left renal stone History of chronic left dystrophic calcification based on review in care everywhere of CT scans at [facility] Unable to directly review these outside hospital images however suspect that the renal pelvic stone may be new We briefly discussed operative management of the stone, however the patient prefers to follow-up at [facility] which is quite reasonable  F/u as needed  Hollice Espy, MD  [place] [address] [place], [place] [address] [phone]

## 2019-01-20 ENCOUNTER — Inpatient Hospital Stay
Admission: EM | Admit: 2019-01-20 | Discharge: 2019-01-24 | DRG: 853 | Disposition: A | Discharge status: Home Health | Payer: Medicare Other | Attending: Internal Medicine | Admitting: Internal Medicine

## 2019-01-20 ENCOUNTER — Encounter: Payer: Self-pay | Admitting: Emergency Medicine

## 2019-01-20 ENCOUNTER — Emergency Department: Payer: Medicare Other

## 2019-01-20 ENCOUNTER — Other Ambulatory Visit: Payer: Self-pay

## 2019-01-20 DIAGNOSIS — Z8673 Personal history of transient ischemic attack (TIA), and cerebral infarction without residual deficits: Secondary | ICD-10-CM | POA: Diagnosis not present

## 2019-01-20 DIAGNOSIS — Z905 Acquired absence of kidney: Secondary | ICD-10-CM

## 2019-01-20 DIAGNOSIS — E039 Hypothyroidism, unspecified: Secondary | ICD-10-CM | POA: Diagnosis present

## 2019-01-20 DIAGNOSIS — Z79899 Other long term (current) drug therapy: Secondary | ICD-10-CM

## 2019-01-20 DIAGNOSIS — Z8711 Personal history of peptic ulcer disease: Secondary | ICD-10-CM

## 2019-01-20 DIAGNOSIS — I2510 Atherosclerotic heart disease of native coronary artery without angina pectoris: Secondary | ICD-10-CM | POA: Diagnosis present

## 2019-01-20 DIAGNOSIS — J189 Pneumonia, unspecified organism: Secondary | ICD-10-CM | POA: Diagnosis present

## 2019-01-20 DIAGNOSIS — I252 Old myocardial infarction: Secondary | ICD-10-CM

## 2019-01-20 DIAGNOSIS — N1330 Unspecified hydronephrosis: Secondary | ICD-10-CM | POA: Diagnosis not present

## 2019-01-20 DIAGNOSIS — N136 Pyonephrosis: Secondary | ICD-10-CM | POA: Diagnosis present

## 2019-01-20 DIAGNOSIS — F200 Paranoid schizophrenia: Secondary | ICD-10-CM | POA: Diagnosis present

## 2019-01-20 DIAGNOSIS — F17210 Nicotine dependence, cigarettes, uncomplicated: Secondary | ICD-10-CM | POA: Diagnosis present

## 2019-01-20 DIAGNOSIS — Z808 Family history of malignant neoplasm of other organs or systems: Secondary | ICD-10-CM

## 2019-01-20 DIAGNOSIS — N35919 Unspecified urethral stricture, male, unspecified site: Secondary | ICD-10-CM | POA: Diagnosis present

## 2019-01-20 DIAGNOSIS — K219 Gastro-esophageal reflux disease without esophagitis: Secondary | ICD-10-CM | POA: Diagnosis present

## 2019-01-20 DIAGNOSIS — N201 Calculus of ureter: Secondary | ICD-10-CM | POA: Diagnosis not present

## 2019-01-20 DIAGNOSIS — Z823 Family history of stroke: Secondary | ICD-10-CM

## 2019-01-20 DIAGNOSIS — J440 Chronic obstructive pulmonary disease with acute lower respiratory infection: Secondary | ICD-10-CM | POA: Diagnosis present

## 2019-01-20 DIAGNOSIS — N312 Flaccid neuropathic bladder, not elsewhere classified: Secondary | ICD-10-CM | POA: Diagnosis present

## 2019-01-20 DIAGNOSIS — Z7951 Long term (current) use of inhaled steroids: Secondary | ICD-10-CM

## 2019-01-20 DIAGNOSIS — E871 Hypo-osmolality and hyponatremia: Secondary | ICD-10-CM | POA: Diagnosis not present

## 2019-01-20 DIAGNOSIS — K529 Noninfective gastroenteritis and colitis, unspecified: Secondary | ICD-10-CM | POA: Diagnosis present

## 2019-01-20 DIAGNOSIS — A419 Sepsis, unspecified organism: Principal | ICD-10-CM | POA: Diagnosis present

## 2019-01-20 DIAGNOSIS — F329 Major depressive disorder, single episode, unspecified: Secondary | ICD-10-CM | POA: Diagnosis present

## 2019-01-20 DIAGNOSIS — Z20828 Contact with and (suspected) exposure to other viral communicable diseases: Secondary | ICD-10-CM | POA: Diagnosis present

## 2019-01-20 DIAGNOSIS — I129 Hypertensive chronic kidney disease with stage 1 through stage 4 chronic kidney disease, or unspecified chronic kidney disease: Secondary | ICD-10-CM | POA: Diagnosis present

## 2019-01-20 DIAGNOSIS — G40909 Epilepsy, unspecified, not intractable, without status epilepticus: Secondary | ICD-10-CM | POA: Diagnosis present

## 2019-01-20 DIAGNOSIS — E872 Acidosis: Secondary | ICD-10-CM | POA: Diagnosis present

## 2019-01-20 DIAGNOSIS — N132 Hydronephrosis with renal and ureteral calculous obstruction: Secondary | ICD-10-CM | POA: Diagnosis not present

## 2019-01-20 DIAGNOSIS — J441 Chronic obstructive pulmonary disease with (acute) exacerbation: Secondary | ICD-10-CM | POA: Diagnosis present

## 2019-01-20 DIAGNOSIS — E875 Hyperkalemia: Secondary | ICD-10-CM | POA: Diagnosis present

## 2019-01-20 DIAGNOSIS — N183 Chronic kidney disease, stage 3 (moderate): Secondary | ICD-10-CM | POA: Diagnosis present

## 2019-01-20 DIAGNOSIS — Z888 Allergy status to other drugs, medicaments and biological substances status: Secondary | ICD-10-CM

## 2019-01-20 DIAGNOSIS — Z87442 Personal history of urinary calculi: Secondary | ICD-10-CM

## 2019-01-20 DIAGNOSIS — N179 Acute kidney failure, unspecified: Secondary | ICD-10-CM | POA: Diagnosis present

## 2019-01-20 DIAGNOSIS — Z79890 Hormone replacement therapy (postmenopausal): Secondary | ICD-10-CM

## 2019-01-20 DIAGNOSIS — Z885 Allergy status to narcotic agent status: Secondary | ICD-10-CM

## 2019-01-20 DIAGNOSIS — R1110 Vomiting, unspecified: Secondary | ICD-10-CM | POA: Diagnosis present

## 2019-01-20 LAB — CBC WITH DIFFERENTIAL/PLATELET
Abs Immature Granulocytes: 0.16 10*3/uL — ABNORMAL HIGH (ref 0.00–0.07)
Basophils Absolute: 0 10*3/uL (ref 0.0–0.1)
Basophils Relative: 0 %
Eosinophils Absolute: 0 10*3/uL (ref 0.0–0.5)
Eosinophils Relative: 0 %
HCT: 43.2 % (ref 39.0–52.0)
Hemoglobin: 14.4 g/dL (ref 13.0–17.0)
Immature Granulocytes: 1 %
Lymphocytes Relative: 2 %
Lymphs Abs: 0.3 10*3/uL — ABNORMAL LOW (ref 0.7–4.0)
MCH: 30.7 pg (ref 26.0–34.0)
MCHC: 33.3 g/dL (ref 30.0–36.0)
MCV: 92.1 fL (ref 80.0–100.0)
Monocytes Absolute: 0.4 10*3/uL (ref 0.1–1.0)
Monocytes Relative: 2 %
Neutro Abs: 13.7 10*3/uL — ABNORMAL HIGH (ref 1.7–7.7)
Neutrophils Relative %: 95 %
Platelets: 156 10*3/uL (ref 150–400)
RBC: 4.69 MIL/uL (ref 4.22–5.81)
RDW: 13.1 % (ref 11.5–15.5)
WBC: 14.6 10*3/uL — ABNORMAL HIGH (ref 4.0–10.5)
nRBC: 0 % (ref 0.0–0.2)

## 2019-01-20 LAB — COMPREHENSIVE METABOLIC PANEL WITH GFR
ALT: 18 U/L (ref 0–44)
AST: 26 U/L (ref 15–41)
Albumin: 3.6 g/dL (ref 3.5–5.0)
Alkaline Phosphatase: 105 U/L (ref 38–126)
Anion gap: 9 (ref 5–15)
BUN: 41 mg/dL — ABNORMAL HIGH (ref 8–23)
CO2: 19 mmol/L — ABNORMAL LOW (ref 22–32)
Calcium: 9.4 mg/dL (ref 8.9–10.3)
Chloride: 110 mmol/L (ref 98–111)
Creatinine, Ser: 2.78 mg/dL — ABNORMAL HIGH (ref 0.61–1.24)
GFR calc Af Amer: 27 mL/min — ABNORMAL LOW
GFR calc non Af Amer: 23 mL/min — ABNORMAL LOW
Glucose, Bld: 108 mg/dL — ABNORMAL HIGH (ref 70–99)
Potassium: 4.4 mmol/L (ref 3.5–5.1)
Sodium: 138 mmol/L (ref 135–145)
Total Bilirubin: 0.9 mg/dL (ref 0.3–1.2)
Total Protein: 7.2 g/dL (ref 6.5–8.1)

## 2019-01-20 LAB — URINALYSIS, COMPLETE (UACMP) WITH MICROSCOPIC
Bilirubin Urine: NEGATIVE
Glucose, UA: NEGATIVE mg/dL
Ketones, ur: NEGATIVE mg/dL
Nitrite: NEGATIVE
Protein, ur: 100 mg/dL — AB
RBC / HPF: >50 RBC/hpf — ABNORMAL HIGH (ref 0–5)
Specific Gravity, Urine: 1.014 (ref 1.005–1.030)
WBC, UA: >50 WBC/hpf — ABNORMAL HIGH (ref 0–5)
pH: 7 (ref 5.0–8.0)

## 2019-01-20 LAB — GASTROINTESTINAL PANEL BY PCR, STOOL (REPLACES STOOL CULTURE)

## 2019-01-20 LAB — PROCALCITONIN: Procalcitonin: 1.06 ng/mL

## 2019-01-20 LAB — PROTIME-INR
INR: 1.1 (ref 0.8–1.2)
Prothrombin Time: 13.7 seconds (ref 11.4–15.2)

## 2019-01-20 LAB — MRSA PCR SCREENING: MRSA by PCR: POSITIVE — AB

## 2019-01-20 LAB — C DIFFICILE QUICK SCREEN W PCR REFLEX
C Diff antigen: NEGATIVE
C Diff interpretation: NOT DETECTED
C Diff toxin: NEGATIVE

## 2019-01-20 LAB — LACTIC ACID, PLASMA
Lactic Acid, Venous: 1.8 mmol/L (ref 0.5–1.9)
Lactic Acid, Venous: 1.2 mmol/L (ref 0.5–1.9)

## 2019-01-20 LAB — MAGNESIUM: Magnesium: 1.8 mg/dL (ref 1.7–2.4)

## 2019-01-20 LAB — SARS CORONAVIRUS 2 BY RT PCR (HOSPITAL ORDER, PERFORMED IN ~~LOC~~ HOSPITAL LAB): SARS Coronavirus 2: NEGATIVE

## 2019-01-20 MED ORDER — MUPIROCIN 2 % EX OINT
1.0000 "application " | TOPICAL_OINTMENT | Freq: Two times a day (BID) | CUTANEOUS | Status: DC
  Administered 2019-01-20 – 2019-01-24 (×8): 1 via NASAL
  Filled 2019-01-20: qty 22

## 2019-01-20 MED ORDER — DOCUSATE SODIUM 100 MG PO CAPS: 100.0000 mg | ORAL_CAPSULE | Freq: Two times a day (BID) | ORAL | Status: DC | PRN

## 2019-01-20 MED ORDER — SODIUM CHLORIDE 0.9 % IV SOLN
500.0000 mg | INTRAVENOUS | Status: DC
  Administered 2019-01-20: 500 mg via INTRAVENOUS
  Filled 2019-01-20: qty 500

## 2019-01-20 MED ORDER — HEPARIN SODIUM (PORCINE) 5000 UNIT/ML IJ SOLN
5000.0000 [IU] | Freq: Three times a day (TID) | INTRAMUSCULAR | Status: DC
  Administered 2019-01-20 – 2019-01-24 (×10): 5000 [IU] via SUBCUTANEOUS
  Filled 2019-01-20 (×11): qty 1

## 2019-01-20 MED ORDER — MOMETASONE FURO-FORMOTEROL FUM 200-5 MCG/ACT IN AERO
2.0000 | INHALATION_SPRAY | Freq: Two times a day (BID) | RESPIRATORY_TRACT | Status: DC
  Administered 2019-01-20 – 2019-01-24 (×8): 2 via RESPIRATORY_TRACT
  Filled 2019-01-20: qty 8.8

## 2019-01-20 MED ORDER — SODIUM CHLORIDE 0.9 % IV SOLN
1.0000 g | INTRAVENOUS | Status: DC
  Administered 2019-01-21 – 2019-01-24 (×4): 1 g via INTRAVENOUS
  Filled 2019-01-20: qty 10
  Filled 2019-01-20 (×3): qty 1
  Filled 2019-01-20: qty 10

## 2019-01-20 MED ORDER — SODIUM CHLORIDE 0.9 % IV SOLN
2.0000 g | INTRAVENOUS | Status: DC
  Administered 2019-01-20: 2 g via INTRAVENOUS
  Filled 2019-01-20: qty 20

## 2019-01-20 MED ORDER — CHLORHEXIDINE GLUCONATE CLOTH 2 % EX PADS
6.0000 | MEDICATED_PAD | Freq: Every day | CUTANEOUS | Status: DC
  Administered 2019-01-21 – 2019-01-24 (×3): 6 via TOPICAL

## 2019-01-20 MED ORDER — HYDROCORTISONE ACETATE 25 MG RE SUPP
25.0000 mg | Freq: Two times a day (BID) | RECTAL | Status: DC | PRN
  Filled 2019-01-20 (×2): qty 1

## 2019-01-20 MED ORDER — LEVOTHYROXINE SODIUM 50 MCG PO TABS
50.0000 ug | ORAL_TABLET | Freq: Every day | ORAL | Status: DC
  Administered 2019-01-21 – 2019-01-24 (×4): 50 ug via ORAL
  Filled 2019-01-20 (×4): qty 1

## 2019-01-20 MED ORDER — OXYCODONE-ACETAMINOPHEN 5-325 MG PO TABS
1.0000 | ORAL_TABLET | Freq: Four times a day (QID) | ORAL | Status: DC | PRN
  Administered 2019-01-20 – 2019-01-23 (×6): 1 via ORAL
  Filled 2019-01-20 (×6): qty 1

## 2019-01-20 MED ORDER — SODIUM CHLORIDE 0.9 % IV SOLN
INTRAVENOUS | Status: DC
  Administered 2019-01-20 – 2019-01-21 (×2): via INTRAVENOUS

## 2019-01-20 MED ORDER — PALIPERIDONE PALMITATE ER 156 MG/ML IM SUSY: 156.0000 mg | PREFILLED_SYRINGE | INTRAMUSCULAR | Status: DC

## 2019-01-20 MED ORDER — TAMSULOSIN HCL 0.4 MG PO CAPS
0.4000 mg | ORAL_CAPSULE | ORAL | Status: DC
  Administered 2019-01-21 – 2019-01-24 (×4): 0.4 mg via ORAL
  Filled 2019-01-20 (×5): qty 1

## 2019-01-20 MED ORDER — DEXTROSE 5 % IV SOLN
250.0000 mg | INTRAVENOUS | Status: DC
  Administered 2019-01-21 – 2019-01-24 (×4): 250 mg via INTRAVENOUS
  Filled 2019-01-20 (×6): qty 250

## 2019-01-20 MED ORDER — FLUOXETINE HCL 20 MG PO CAPS
60.0000 mg | ORAL_CAPSULE | ORAL | Status: DC
  Administered 2019-01-21 – 2019-01-24 (×4): 60 mg via ORAL
  Filled 2019-01-20 (×4): qty 3

## 2019-01-20 MED ORDER — ACETAMINOPHEN 500 MG PO TABS
1000.0000 mg | ORAL_TABLET | Freq: Two times a day (BID) | ORAL | Status: DC | PRN
  Administered 2019-01-20 – 2019-01-23 (×5): 1000 mg via ORAL
  Filled 2019-01-20 (×5): qty 2

## 2019-01-20 MED ORDER — SODIUM CHLORIDE 0.9 % IV BOLUS
1000.0000 mL | Freq: Once | INTRAVENOUS | Status: AC
  Administered 2019-01-20: 1000 mL via INTRAVENOUS

## 2019-01-20 MED ORDER — IPRATROPIUM-ALBUTEROL 0.5-2.5 (3) MG/3ML IN SOLN: 3.0000 mL | Freq: Four times a day (QID) | RESPIRATORY_TRACT | Status: DC | PRN

## 2019-01-20 NOTE — ED Notes (Signed)
Pt given phone to speak to sister.

## 2019-01-20 NOTE — H&P (Signed)
[place] [facility] [place] NAME: Alan Small    MR#:  [id number]  DATE OF BIRTH:  09/28/1955  DATE OF ADMISSION:  01/20/2019  PRIMARY CARE PHYSICIAN: [name], MD   REQUESTING/REFERRING PHYSICIAN: [place]  CHIEF COMPLAINT:   Chief Complaint  Patient presents with  . Chills  . Fever  . Emesis    HISTORY OF PRESENT ILLNESS: Alan Small  is a 63 y.o. male with a known history of asthma, COPD, depression, gastric ulcer, hydronephrosis, hypothyroidism, myocardial infarction, schizophrenia, chronic renal failure stage III, seizures, stroke-lives in a group home.  He came to emergency room with complaint of nausea and diarrhea along with some fever chills and excessive sweating for last 1 day.  He denies any cough or shortness of breath but he had some abdominal pain. In ER he was noted to be septic with tachycardia, tachypnea, fever, elevated white blood cell count.  His chest x-ray was suspicious of some infiltrate and has high procalcitonin. His COVID-19 test was negative.  Urinalysis was still not collected.  Patient was given to hospitalist team to admit for further management.  PAST MEDICAL HISTORY:   Past Medical History:  Diagnosis Date  . Asthma   . COPD (chronic obstructive pulmonary disease) ([place])   . Depression   . External hemorrhoids   . Gastric ulcer   . GERD (gastroesophageal reflux disease)   . Hydronephrosis   . Hypothyroidism    hypo  . Insomnia   . Mental disorder   . Myocardial infarction ([place])   . Paranoid schizophrenia ([place])   . Renal disorder    renal failure  . Seizures ([place])   . Stroke [facility])     PAST SURGICAL HISTORY:  Past Surgical History:  Procedure Laterality Date  . APPENDECTOMY    . COLONOSCOPY WITH PROPOFOL N/A 12/07/2014   Procedure: COLONOSCOPY WITH PROPOFOL;  Surgeon: Manya Silvas, MD;  Location: [facility] ENDOSCOPY;  Service: Endoscopy;  Laterality: N/A;  . COLONOSCOPY WITH PROPOFOL N/A  10/10/2016   Procedure: COLONOSCOPY WITH PROPOFOL;  Surgeon: Lucilla Lame, MD;  Location: [place];  Service: Endoscopy;  Laterality: N/A;  . ESOPHAGOGASTRODUODENOSCOPY N/A 12/07/2014   Procedure: ESOPHAGOGASTRODUODENOSCOPY (EGD);  Surgeon: Manya Silvas, MD;  Location: [facility] ENDOSCOPY;  Service: Endoscopy;  Laterality: N/A;  . NEPHRECTOMY    . POLYPECTOMY  10/10/2016   Procedure: POLYPECTOMY;  Surgeon: Lucilla Lame, MD;  Location: [place];  Service: Endoscopy;;  . SUPRAPUBIC CATHETER INSERTION      SOCIAL HISTORY:  Social History   Tobacco Use  . Smoking status: Current Every Day Smoker    Packs/day: 1.00    Years: 55.00    Pack years: 55.00    Types: Cigarettes  . Smokeless tobacco: Former Systems developer    Types: Chew  Substance Use Topics  . Alcohol use: No    FAMILY HISTORY:  Family History  Problem Relation Age of Onset  . Stroke Father   . Pneumonia Father   . Brain cancer Mother     DRUG ALLERGIES:  Allergies  Allergen Reactions  . Mellaril [Thioridazine] Shortness Of Breath and Swelling  . Buprenorphine Itching  . Morphine And Related Itching  . Navane [Thiothixene] Other (See Comments)    Reaction:  GI upset   . Thorazine [Chlorpromazine] Other (See Comments)    Reaction:  Dizziness/fainting     REVIEW OF SYSTEMS:   CONSTITUTIONAL: have fever, fatigue or weakness.  EYES: No blurred  or double vision.  EARS, NOSE, AND THROAT: No tinnitus or ear pain.  RESPIRATORY: No cough, shortness of breath, wheezing or hemoptysis.  CARDIOVASCULAR: No chest pain, orthopnea, edema.  GASTROINTESTINAL: have nausea, vomiting, diarrhea or abdominal pain.  GENITOURINARY: No dysuria, hematuria.  ENDOCRINE: No polyuria, nocturia,  HEMATOLOGY: No anemia, easy bruising or bleeding SKIN: No rash or lesion. MUSCULOSKELETAL: No joint pain or arthritis.   NEUROLOGIC: No tingling, numbness, weakness.  PSYCHIATRY: No anxiety or depression.   MEDICATIONS AT HOME:   Prior to Admission medications   Medication Sig Start Date End Date Taking? Authorizing Provider  acetaminophen (TYLENOL) 500 MG tablet Take 1,000 mg by mouth 2 (two) times daily as needed for mild pain, moderate pain or fever.    Yes [provider]  albuterol-ipratropium (COMBIVENT) 18-103 MCG/ACT inhaler Inhale 1 puff into the lungs every 6 (six) hours as needed for wheezing or shortness of breath.    Yes [provider]  budesonide-formoterol (SYMBICORT) 160-4.5 MCG/ACT inhaler Inhale 1 puff into the lungs 2 (two) times daily.  03/03/16  Yes [provider]  FLUoxetine (PROZAC) 20 MG capsule Take 60 mg by mouth every morning.   Yes [provider]  hydrocortisone (ANUSOL-HC) 25 MG suppository Place 25 mg rectally 2 (two) times daily as needed for hemorrhoids or anal itching.   Yes [provider]  INVEGA SUSTENNA 156 MG/ML SUSY injection Inject 156 mg into the muscle every 30 (thirty) days. 08/20/18  Yes [provider]  levothyroxine (SYNTHROID, LEVOTHROID) 50 MCG tablet Take 50 mcg by mouth daily before breakfast.   Yes [provider]  sodium bicarbonate 650 MG tablet Take 650 mg by mouth 2 (two) times daily.    Yes [provider]  tamsulosin (FLOMAX) 0.4 MG CAPS capsule Take 0.4 mg by mouth every morning.    Yes [provider]  nystatin (MYCOSTATIN/NYSTOP) powder Apply topically 2 (two) times daily. Patient not taking: Reported on 01/20/2019 08/22/18   Hillary Bow, MD      PHYSICAL EXAMINATION:   VITAL SIGNS: Blood pressure 134/76, pulse 97, temperature 99.5 F (37.5 C), temperature source Oral, resp. rate 13, height 6\' 4"  (1.93 m), weight 74.8 kg, SpO2 97 %.  GENERAL:  63 y.o.-year-old patient lying in the bed with no acute distress.  EYES: Pupils equal, round, reactive to light and accommodation. No scleral icterus. Extraocular muscles intact.  HEENT: Head atraumatic, normocephalic. Oropharynx and  nasopharynx clear.  NECK:  Supple, no jugular venous distention. No thyroid enlargement, no tenderness.  LUNGS: Normal breath sounds bilaterally, no wheezing, some crepitation. No use of accessory muscles of respiration.  CARDIOVASCULAR: S1, S2 normal. No murmurs, rubs, or gallops.  ABDOMEN: Soft, nontender, nondistended. Bowel sounds present. No organomegaly or mass.  EXTREMITIES: No pedal edema, cyanosis, or clubbing.  NEUROLOGIC: Cranial nerves II through XII are intact. Muscle strength 5/5 in all extremities. Sensation intact. Gait not checked.  PSYCHIATRIC: The patient is alert and oriented x 3.  SKIN: No obvious rash, lesion, or ulcer.   LABORATORY PANEL:   CBC Recent Labs  Lab 01/20/19 0844  WBC 14.6*  HGB 14.4  HCT 43.2  PLT 156  MCV 92.1  MCH 30.7  MCHC 33.3  RDW 13.1  LYMPHSABS 0.3*  MONOABS 0.4  EOSABS 0.0  BASOSABS 0.0   ------------------------------------------------------------------------------------------------------------------  Chemistries  Recent Labs  Lab 01/20/19 0844  NA 138  K 4.4  CL 110  CO2 19*  GLUCOSE 108*  BUN 41*  CREATININE  2.78*  CALCIUM 9.4  MG 1.8  AST 26  ALT 18  ALKPHOS 105  BILITOT 0.9   ------------------------------------------------------------------------------------------------------------------ estimated creatinine clearance is 28.4 mL/min (A) (by C-G formula based on SCr of 2.78 mg/dL (H)). ------------------------------------------------------------------------------------------------------------------ No results for input(s): TSH, T4TOTAL, T3FREE, THYROIDAB in the last 72 hours.  Invalid input(s): FREET3   Coagulation profile Recent Labs  Lab 01/20/19 0844  INR 1.1   ------------------------------------------------------------------------------------------------------------------- No results for input(s): DDIMER in the last 72  hours. -------------------------------------------------------------------------------------------------------------------  Cardiac Enzymes No results for input(s): CKMB, TROPONINI, MYOGLOBIN in the last 168 hours.  Invalid input(s): CK ------------------------------------------------------------------------------------------------------------------ Invalid input(s): POCBNP  ---------------------------------------------------------------------------------------------------------------  Urinalysis    Component Value Date/Time   COLORURINE YELLOW (A) 08/20/2018 1126   APPEARANCEUR Cloudy (A) 12/01/2018 0927   LABSPEC 1.011 08/20/2018 1126   LABSPEC 1.010 02/16/2014 1650   PHURINE 7.0 08/20/2018 1126   GLUCOSEU Negative 12/01/2018 0927   GLUCOSEU Negative 02/16/2014 1650   HGBUR LARGE (A) 08/20/2018 1126   BILIRUBINUR Negative 12/01/2018 0927   BILIRUBINUR Negative 02/16/2014 1650   KETONESUR NEGATIVE 08/20/2018 1126   PROTEINUR 2+ (A) 12/01/2018 0927   PROTEINUR NEGATIVE 08/20/2018 1126   UROBILINOGEN 0.2 11/07/2012 1049   NITRITE Positive (A) 12/01/2018 0927   NITRITE POSITIVE (A) 08/20/2018 1126   LEUKOCYTESUR 3+ (A) 12/01/2018 0927   LEUKOCYTESUR LARGE (A) 08/20/2018 1126   LEUKOCYTESUR 3+ 02/16/2014 1650     RADIOLOGY: No results found.  EKG: Orders placed or performed during the hospital encounter of 01/20/19  . EKG 12-Lead  . EKG 12-Lead  . ED EKG 12-Lead  . ED EKG 12-Lead    IMPRESSION AND PLAN:  *Sepsis Due to pneumonia Still need to check urinalysis. He also have complaints of diarrhea and abdominal pain  We will start on antibiotics, IV fluids. Blood culture is sent. Please send urinalysis and check for stool studies.  *Community-acquired pneumonia IV antibiotics and fluids as mentioned above.  *Diarrhea Check for GI panel and C. difficile.  *Acute renal failure on CKD stage III Avoid nephrotoxic medications and keep on IV fluid and monitor  renal function.  *COPD Continue home inhalers, no exacerbation symptoms.  *Hypothyroid  Continue levothyroxine.   All the records are reviewed and case discussed with ED provider. Management plans discussed with the patient, family and they are in agreement.  CODE STATUS: full.    Code Status Orders  (From admission, onward)         Start     Ordered   01/20/19 1332  Full code  Continuous     01/20/19 1331        Code Status History    Date Active Date Inactive Code Status Order ID Comments User Context   08/20/2018 1729 08/22/2018 1829 Full Code [id number]  Fritzi Mandes, MD Inpatient   02/26/2016 1748 02/28/2016 2109 Full Code [id number]  Henreitta Leber, MD Inpatient   05/06/2015 1856 05/15/2015 0115 Full Code [id number]  Henreitta Leber, MD Inpatient   01/17/2015 1206 01/22/2015 1645 Full Code [id number]  Hillary Bow, MD ED   11/03/2012 0300 11/11/2012 2134 Full Code [id number]  Rise Patience, MD Inpatient   Advance Care Planning Activity    Advance Directive Documentation     Most Recent Value  Type of Advance Directive  Healthcare Power of Attorney  Pre-existing out of facility DNR order (yellow form or pink MOST form)  -  "MOST" Form in Place?  -       TOTAL TIME  TAKING CARE OF THIS PATIENT: 45 minutes.    Vaughan Basta M.D on 01/20/2019   Between 7am to 6pm - Pager - [phone]  After 6pm go to [email or web address] - password [email or web address] [facility]  Office  [phone]  CC: Primary care physician; [name], MD   Note: This dictation was prepared with Dragon dictation along with smaller phrase technology. Any transcriptional errors that result from this process are unintentional.

## 2019-01-20 NOTE — ED Notes (Signed)
ED TO INPATIENT HANDOFF REPORT  ED Nurse Name and Phone #:  Angelyse Heslin/Sam [phone]  S Name/Age/Gender Alan Small 63 y.o. male Room/Bed: ED32A/ED32A  Code Status   Code Status: Full Code  Home/SNF/Other Group Home Patient oriented to: self, place and situation Is this baseline? Yes   Triage Complete: Triage complete  Chief Complaint fever  Triage Note Pt to ED via EMS from group home [facility] and [facility]) c/o waking up around 0600 today with chills, fever, n/v x3, diarrhea x1 and cough.  EMS vitals 102.2 temp, 135 HR, 150/90 BP, 94% RA.  States is around 3 others at group home and his roommate has been sick recently with cough.  Given 232mL fluids and 4mg  zofran en route.    Allergies Allergies  Allergen Reactions  . Mellaril [Thioridazine] Shortness Of Breath and Swelling  . Buprenorphine Itching  . Morphine And Related Itching  . Navane [Thiothixene] Other (See Comments)    Reaction:  GI upset   . Thorazine [Chlorpromazine] Other (See Comments)    Reaction:  Dizziness/fainting     Level of Care/Admitting Diagnosis ED Disposition    ED Disposition Condition [facility] Area: [place] [[id number]]  Level of Care: Med-Surg [16]  Covid Evaluation: Confirmed COVID Negative  Diagnosis: Sepsis [facility]) [[id number]]  Admitting Physician: Vaughan Basta [phone]  Attending Physician: Vaughan Basta [phone]  Estimated length of stay: past midnight tomorrow  Certification:: I certify this patient will need inpatient services for at least 2 midnights  PT Class (Do Not Modify): Inpatient [101]  PT Acc Code (Do Not Modify): Private [1]       B Medical/Surgery History Past Medical History:  Diagnosis Date  . Asthma   . COPD (chronic obstructive pulmonary disease) ([place])   . Depression   . External hemorrhoids   . Gastric ulcer   . GERD (gastroesophageal reflux disease)   . Hydronephrosis   . Hypothyroidism     hypo  . Insomnia   . Mental disorder   . Myocardial infarction ([place])   . Paranoid schizophrenia ([place])   . Renal disorder    renal failure  . Seizures ([place])   . Stroke [facility])    Past Surgical History:  Procedure Laterality Date  . APPENDECTOMY    . COLONOSCOPY WITH PROPOFOL N/A 12/07/2014   Procedure: COLONOSCOPY WITH PROPOFOL;  Surgeon: Manya Silvas, MD;  Location: [facility] ENDOSCOPY;  Service: Endoscopy;  Laterality: N/A;  . COLONOSCOPY WITH PROPOFOL N/A 10/10/2016   Procedure: COLONOSCOPY WITH PROPOFOL;  Surgeon: Lucilla Lame, MD;  Location: [place];  Service: Endoscopy;  Laterality: N/A;  . ESOPHAGOGASTRODUODENOSCOPY N/A 12/07/2014   Procedure: ESOPHAGOGASTRODUODENOSCOPY (EGD);  Surgeon: Manya Silvas, MD;  Location: [facility] ENDOSCOPY;  Service: Endoscopy;  Laterality: N/A;  . NEPHRECTOMY    . POLYPECTOMY  10/10/2016   Procedure: POLYPECTOMY;  Surgeon: Lucilla Lame, MD;  Location: [place];  Service: Endoscopy;;  . SUPRAPUBIC CATHETER INSERTION       A IV Location/Drains/Wounds Patient Lines/Drains/Airways Status   Active Line/Drains/Airways    Name:   Placement date:   Placement time:   Site:   Days:   Peripheral IV 02/26/16 Right;Upper Wrist   02/26/16    1638    Wrist   1059   Peripheral IV 01/20/19 Left Antecubital   01/20/19    0832    Antecubital   less than 1   Peripheral IV 01/20/19 Right Antecubital  01/20/19    0901    Antecubital   less than 1   PICC Double Lumen 16/10/96 PICC Right Basilic 49 cm 4 cm   04/54/09    1430     1353   PICC Double Lumen 81/19/14 PICC Left Basilic 48 cm 0 cm   78/29/56    1731     1058   Suprapubic Catheter Latex 14 Fr.   01/18/16    1200    Latex   1098   Incision (Closed) 10/10/16 Rectum Other (Comment)   10/10/16    1026     832          Intake/Output Last 24 hours No intake or output data in the 24 hours ending 01/20/19 1539  Labs/Imaging Results for orders placed or performed during the hospital encounter  of 01/20/19 (from the past 48 hour(s))  Comprehensive metabolic panel     Status: Abnormal   Collection Time: 01/20/19  8:44 AM  Result Value Ref Range   Sodium 138 135 - 145 mmol/L   Potassium 4.4 3.5 - 5.1 mmol/L   Chloride 110 98 - 111 mmol/L   CO2 19 (L) 22 - 32 mmol/L   Glucose, Bld 108 (H) 70 - 99 mg/dL   BUN 41 (H) 8 - 23 mg/dL   Creatinine, Ser 2.78 (H) 0.61 - 1.24 mg/dL   Calcium 9.4 8.9 - 10.3 mg/dL   Total Protein 7.2 6.5 - 8.1 g/dL   Albumin 3.6 3.5 - 5.0 g/dL   AST 26 15 - 41 U/L   ALT 18 0 - 44 U/L   Alkaline Phosphatase 105 38 - 126 U/L   Total Bilirubin 0.9 0.3 - 1.2 mg/dL   GFR calc non Af Amer 23 (L) >60 mL/min   GFR calc Af Amer 27 (L) >60 mL/min   Anion gap 9 5 - 15    Comment: Performed at [facility], [address] [place], [place] [address]  Lactic acid, plasma     Status: None   Collection Time: 01/20/19  8:44 AM  Result Value Ref Range   Lactic Acid, Venous 1.8 0.5 - 1.9 mmol/L    Comment: Performed at [facility], [address] [place], [place] [address]  Lactic acid, plasma     Status: None   Collection Time: 01/20/19  8:44 AM  Result Value Ref Range   Lactic Acid, Venous 1.2 0.5 - 1.9 mmol/L    Comment: Performed at [facility], [address] [place], [place] [address]  CBC with Differential     Status: Abnormal   Collection Time: 01/20/19  8:44 AM  Result Value Ref Range   WBC 14.6 (H) 4.0 - 10.5 K/uL   RBC 4.69 4.22 - 5.81 MIL/uL   Hemoglobin 14.4 13.0 - 17.0 g/dL   HCT 43.2 39.0 - 52.0 %   MCV 92.1 80.0 - 100.0 fL   MCH 30.7 26.0 - 34.0 pg   MCHC 33.3 30.0 - 36.0 g/dL   RDW 13.1 11.5 - 15.5 %   Platelets 156 150 - 400 K/uL   nRBC 0.0 0.0 - 0.2 %   Neutrophils Relative % 95 %   Neutro Abs 13.7 (H) 1.7 - 7.7 K/uL   Lymphocytes Relative 2 %   Lymphs Abs 0.3 (L) 0.7 - 4.0 K/uL   Monocytes Relative 2 %   Monocytes Absolute 0.4 0.1 - 1.0 K/uL   Eosinophils Relative 0 %   Eosinophils Absolute 0.0  0.0 -  0.5 K/uL   Basophils Relative 0 %   Basophils Absolute 0.0 0.0 - 0.1 K/uL   Immature Granulocytes 1 %   Abs Immature Granulocytes 0.16 (H) 0.00 - 0.07 K/uL    Comment: Performed at [facility], [address] [place], [place] [address]  Protime-INR     Status: None   Collection Time: 01/20/19  8:44 AM  Result Value Ref Range   Prothrombin Time 13.7 11.4 - 15.2 seconds   INR 1.1 0.8 - 1.2    Comment: (NOTE) INR goal varies based on device and disease states. Performed at [facility], [address] [place], [place] [address]   SARS Coronavirus 2 [facility] order, Performed in [facility] hospital lab) Nasopharyngeal Nasopharyngeal Swab     Status: None   Collection Time: 01/20/19  8:44 AM   Specimen: Nasopharyngeal Swab  Result Value Ref Range   SARS Coronavirus 2 NEGATIVE NEGATIVE    Comment: (NOTE) If result is NEGATIVE SARS-CoV-2 target nucleic acids are NOT DETECTED. The SARS-CoV-2 RNA is generally detectable in upper and lower  respiratory specimens during the acute phase of infection. The lowest  concentration of SARS-CoV-2 viral copies this assay can detect is 250  copies / mL. A negative result does not preclude SARS-CoV-2 infection  and should not be used as the sole basis for treatment or other  patient management decisions.  A negative result may occur with  improper specimen collection / handling, submission of specimen other  than nasopharyngeal swab, presence of viral mutation(s) within the  areas targeted by this assay, and inadequate number of viral copies  (<250 copies / mL). A negative result must be combined with clinical  observations, patient history, and epidemiological information. If result is POSITIVE SARS-CoV-2 target nucleic acids are DETECTED. The SARS-CoV-2 RNA is generally detectable in upper and lower  respiratory specimens dur ing the acute phase of infection.  Positive  results are indicative of active infection with  SARS-CoV-2.  Clinical  correlation with patient history and other diagnostic information is  necessary to determine patient infection status.  Positive results do  not rule out bacterial infection or co-infection with other viruses. If result is PRESUMPTIVE POSTIVE SARS-CoV-2 nucleic acids MAY BE PRESENT.   A presumptive positive result was obtained on the submitted specimen  and confirmed on repeat testing.  While 2019 novel coronavirus  (SARS-CoV-2) nucleic acids may be present in the submitted sample  additional confirmatory testing may be necessary for epidemiological  and / or clinical management purposes  to differentiate between  SARS-CoV-2 and other Sarbecovirus currently known to infect humans.  If clinically indicated additional testing with an alternate test  methodology [phone]) is advised. The SARS-CoV-2 RNA is generally  detectable in upper and lower respiratory sp ecimens during the acute  phase of infection. The expected result is Negative. Fact Sheet for Patients:  StrictlyIdeas.no Fact Sheet for Healthcare Providers: BankingDealers.co.za This test is not yet approved or cleared by the [place] FDA and has been authorized for detection and/or diagnosis of SARS-CoV-2 by FDA under an Emergency Use Authorization (EUA).  This EUA will remain in effect (meaning this test can be used) for the duration of the COVID-19 declaration under Section 564(b)(1) of the Act, 21 U.S.C. section 360bbb-3(b)(1), unless the authorization is terminated or revoked sooner. Performed at [facility], [address], [place], [place] [address]   Procalcitonin     Status: None   Collection Time: 01/20/19  8:44 AM  Result Value Ref Range   Procalcitonin 1.06 ng/mL    Comment:        Interpretation: PCT > 0.5 ng/mL and <= 2 ng/mL: Systemic infection (sepsis) is possible, but other conditions are known to elevate PCT as  well. (NOTE)       Sepsis PCT Algorithm           Lower Respiratory Tract                                      Infection PCT Algorithm    ----------------------------     ----------------------------         PCT < 0.25 ng/mL                PCT < 0.10 ng/mL         Strongly encourage             Strongly discourage   discontinuation of antibiotics    initiation of antibiotics    ----------------------------     -----------------------------       PCT 0.25 - 0.50 ng/mL            PCT 0.10 - 0.25 ng/mL               OR       >80% decrease in PCT            Discourage initiation of                                            antibiotics      Encourage discontinuation           of antibiotics    ----------------------------     -----------------------------         PCT >= 0.50 ng/mL              PCT 0.26 - 0.50 ng/mL                AND       <80% decrease in PCT             Encourage initiation of                                             antibiotics       Encourage continuation           of antibiotics    ----------------------------     -----------------------------        PCT >= 0.50 ng/mL                  PCT > 0.50 ng/mL               AND         increase in PCT                  Strongly encourage                                      initiation of antibiotics    Strongly encourage  escalation           of antibiotics                                     -----------------------------                                           PCT <= 0.25 ng/mL                                                 OR                                        > 80% decrease in PCT                                     Discontinue / Do not initiate                                             antibiotics Performed at [facility], [address] [place], [place] [address]   Magnesium     Status: None   Collection Time: 01/20/19  8:44 AM  Result Value Ref Range   Magnesium 1.8 1.7 - 2.4 mg/dL     Comment: Performed at [facility], [address] [place], [place] [address]   No results found.  Pending Labs Unresulted Labs (From admission, onward)    Start     Ordered   01/21/19 9323  Basic metabolic panel  Tomorrow morning,   STAT     01/20/19 1331   01/21/19 0500  CBC  Tomorrow morning,   STAT     01/20/19 1331   01/20/19 1047  Gastrointestinal Panel by PCR , Stool  (Gastrointestinal Panel by PCR, Stool)  Once,   STAT     01/20/19 1046   01/20/19 0830  Culture, blood (Routine x 2)  BLOOD CULTURE X 2,   STAT     01/20/19 0830   01/20/19 0830  Urinalysis, Complete w Microscopic  ONCE - STAT,   STAT     01/20/19 0830          Vitals/Pain Today's Vitals   01/20/19 1332 01/20/19 1405 01/20/19 1448 01/20/19 1448  BP: 136/87 (!) 144/77  139/83  Pulse: 97 95  95  Resp: 20 19  18   Temp:  99.4 F (37.4 C)    TempSrc:  Oral    SpO2: 97% 96%  99%  Weight:      Height:      PainSc:  10-Worst pain ever 4      Isolation Precautions Enteric precautions (UV disinfection)  Medications Medications  acetaminophen (TYLENOL) tablet 1,000 mg (1,000 mg Oral Given 01/20/19 1410)  FLUoxetine (PROZAC) capsule 60 mg (has no administration in time range)  paliperidone (INVEGA SUSTENNA) injection 156 mg (156 mg Intramuscular Not Given 01/20/19  1333)  levothyroxine (SYNTHROID) tablet 50 mcg (has no administration in time range)  tamsulosin (FLOMAX) capsule 0.4 mg (has no administration in time range)  Combivent 18-103 MCG/ACT AERO 1 puff (has no administration in time range)  mometasone-formoterol (DULERA) 200-5 MCG/ACT inhaler 2 puff (has no administration in time range)  hydrocortisone (ANUSOL-HC) suppository 25 mg (has no administration in time range)  docusate sodium (COLACE) capsule 100 mg (has no administration in time range)  heparin injection 5,000 Units (has no administration in time range)  cefTRIAXone (ROCEPHIN) 1 g in sodium chloride 0.9 % 100 mL IVPB (has no  administration in time range)  azithromycin (ZITHROMAX) 250 mg in dextrose 5 % 125 mL IVPB (has no administration in time range)  0.9 %  sodium chloride infusion ( Intravenous New Bag/Given 01/20/19 1250)  sodium chloride 0.9 % bolus 1,000 mL (0 mLs Intravenous Stopped 01/20/19 1039)    Mobility walks with person assist Low fall risk   Focused Assessments See H&P   R Recommendations: See Admitting Provider Note  Report given to:   Additional Notes:  Enteric precautions, stool sample sent to lab

## 2019-01-20 NOTE — ED Notes (Signed)
Science writer of Group home called and pt agreed to this RN for permission to Best boy on pt's plan of care at this time.

## 2019-01-20 NOTE — ED Notes (Addendum)
Pt is diaphoretic and states that this began here in the ED, but that he had chills at home. Pt says he has an occasional cough, vomiting, and diarrhea. Pt states roommate has chronic cough. Pt also states having left lower abdominal pain that's a 9/10.

## 2019-01-20 NOTE — Progress Notes (Signed)
CODE SEPSIS - PHARMACY COMMUNICATION  **Broad Spectrum Antibiotics should be administered within 1 hour of Sepsis diagnosis**  Time Code Sepsis Called/Page Received: 1898  Antibiotics Ordered: Azithromycin/Ceftriaxone   Time of 1st antibiotic administration: Ceftriaxone 1021  Additional action taken by pharmacy: Called ED at 0945; unable to obtain answer. Contacted RN through secure chat.   If necessary, Name of Provider/Nurse Contacted:    Sherrye Puga L ,PharmD Clinical Pharmacist  01/20/2019  8:40 AM

## 2019-01-20 NOTE — ED Notes (Signed)
Pt assisted to bedside commode. Pt placed in gown. Pt informed of needing urine and stool sample.

## 2019-01-20 NOTE — ED Triage Notes (Signed)
Pt to ED via EMS from group home [facility] and [facility]) c/o waking up around 0600 today with chills, fever, n/v x3, diarrhea x1 and cough.  EMS vitals 102.2 temp, 135 HR, 150/90 BP, 94% RA.  States is around 3 others at group home and his roommate has been sick recently with cough.  Given 260mL fluids and 4mg  zofran en route.

## 2019-01-20 NOTE — Progress Notes (Signed)
Notified pharmacist of need to order antibiotics.

## 2019-01-20 NOTE — ED Provider Notes (Signed)
[facility] Emergency Department Provider Note   ____________________________________________    I have reviewed the triage vital signs and the nursing notes.   HISTORY  Chief Complaint Chills, Fever, and Emesis     HPI Alan Small is a 63 y.o. male presents with fever chills nausea and cough.  Patient has history as noted below, does live in a group home.  This morning woke up with chills fever multiple episodes of vomiting, 1 episode of watery diarrhea and intermittent cough.  EMS noted the patient was febrile tachycardic.  Reportedly there have been other coughing residents at his group home.  He denies chest pain.  Does complain of some myalgias.  Past Medical History:  Diagnosis Date   Asthma    COPD (chronic obstructive pulmonary disease) ([place])    Depression    External hemorrhoids    Gastric ulcer    GERD (gastroesophageal reflux disease)    Hydronephrosis    Hypothyroidism    hypo   Insomnia    Mental disorder    Myocardial infarction ([place])    Paranoid schizophrenia ([place])    Renal disorder    renal failure   Seizures ([place])    Stroke [facility])     Patient Active Problem List   Diagnosis Date Noted   Community acquired pneumonia 01/20/2019   Gastroenteritis 01/20/2019   Blood in stool    Change in bowel habits    Constipation    Polyp of sigmoid colon    Depression 02/26/2016   COPD (chronic obstructive pulmonary disease) ([place]) 02/03/2016   Suprapubic catheter ([place]) 12/17/2015   Urethral stricture 12/17/2015   Urinary retention 10/27/2015   Tobacco use disorder 08/27/2015   Malnutrition of moderate degree 05/09/2015   Sepsis ([place]) 05/06/2015   UTI (lower urinary tract infection) 01/17/2015   Hypoglycemia 01/17/2015   Gastroesophageal reflux disease 10/28/2014   Bright red rectal bleeding 10/26/2014   Chronic constipation 10/26/2014   Hydronephrosis of left kidney 01/17/2014    Neurogenic bladder 06/21/2013   Urolithiasis 03/15/2013   Nephrolithiasis 00/93/8182   Metabolic acidosis 99/37/1696   Acute renal failure ([place]) 11/03/2012   Hyperkalemia 11/03/2012   Pyelonephritis 11/03/2012   Schizophrenia ([place]) 11/03/2012   Hypothyroidism 11/03/2012   Severe sepsis ([place]) 11/03/2012   Protein-calorie malnutrition, severe ([place]) 11/03/2012   Encephalopathy acute 11/03/2012   Chronic kidney disease, stage IV (severe) ([place]) 07/02/2012   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   Kidney stone 04/13/2012    Past Surgical History:  Procedure Laterality Date   APPENDECTOMY     COLONOSCOPY WITH PROPOFOL N/A 12/07/2014   Procedure: COLONOSCOPY WITH PROPOFOL;  Surgeon: Manya Silvas, MD;  Location: [facility] ENDOSCOPY;  Service: Endoscopy;  Laterality: N/A;   COLONOSCOPY WITH PROPOFOL N/A 10/10/2016   Procedure: COLONOSCOPY WITH PROPOFOL;  Surgeon: Lucilla Lame, MD;  Location: [place];  Service: Endoscopy;  Laterality: N/A;   ESOPHAGOGASTRODUODENOSCOPY N/A 12/07/2014   Procedure: ESOPHAGOGASTRODUODENOSCOPY (EGD);  Surgeon: Manya Silvas, MD;  Location: [facility] ENDOSCOPY;  Service: Endoscopy;  Laterality: N/A;   NEPHRECTOMY     POLYPECTOMY  10/10/2016   Procedure: POLYPECTOMY;  Surgeon: Lucilla Lame, MD;  Location: [place];  Service: Endoscopy;;   SUPRAPUBIC CATHETER INSERTION      Prior to Admission medications   Medication Sig Start Date End Date Taking? Authorizing Provider  acetaminophen (TYLENOL) 500 MG tablet Take 1,000 mg by mouth 2 (two) times daily as needed for mild pain, moderate pain or fever.  Yes [provider]  albuterol-ipratropium (COMBIVENT) 18-103 MCG/ACT inhaler Inhale 1 puff into the lungs every 6 (six) hours as needed for wheezing or shortness of breath.    Yes [provider]  budesonide-formoterol (SYMBICORT) 160-4.5 MCG/ACT inhaler Inhale 1 puff into the lungs 2 (two) times daily.  03/03/16  Yes [provider]  FLUoxetine (PROZAC) 20 MG capsule Take 60 mg by mouth every morning.   Yes [provider]  hydrocortisone (ANUSOL-HC) 25 MG suppository Place 25 mg rectally 2 (two) times daily as needed for hemorrhoids or anal itching.   Yes [provider]  INVEGA SUSTENNA 156 MG/ML SUSY injection Inject 156 mg into the muscle every 30 (thirty) days. 08/20/18  Yes [provider]  levothyroxine (SYNTHROID, LEVOTHROID) 50 MCG tablet Take 50 mcg by mouth daily before breakfast.   Yes [provider]  sodium bicarbonate 650 MG tablet Take 650 mg by mouth 2 (two) times daily.    Yes [provider]  tamsulosin (FLOMAX) 0.4 MG CAPS capsule Take 0.4 mg by mouth every morning.    Yes [provider]  nystatin (MYCOSTATIN/NYSTOP) powder Apply topically 2 (two) times daily. Patient not taking: Reported on 01/20/2019 08/22/18   Hillary Bow, MD     Allergies Mellaril [thioridazine], Buprenorphine, Morphine and related, Navane [thiothixene], and Thorazine [chlorpromazine]  Family History  Problem Relation Age of Onset   Stroke Father    Pneumonia Father    Brain cancer Mother     Social History Social History   Tobacco Use   Smoking status: Current Every Day Smoker    Packs/day: 1.00    Years: 55.00    Pack years: 55.00    Types: Cigarettes   Smokeless tobacco: Former Systems developer    Types: Chew  Substance Use Topics   Alcohol use: No   Drug use: No    Review of Systems  Constitutional: As above Eyes: No visual changes.  ENT: No sore throat. Cardiovascular: Denies chest pain. Respiratory: Mild shortness of breath..  Positive cough Gastrointestinal: No abdominal pain, vomiting as above Genitourinary: Negative for dysuria. Musculoskeletal: Myalgias Skin: Negative for rash. Neurological: Negative for headaches   ____________________________________________   PHYSICAL EXAM:  VITAL SIGNS: ED Triage Vitals  Enc Vitals  Group     BP 01/20/19 0827 (!) 175/79     Pulse Rate 01/20/19 0827 (!) 138     Resp 01/20/19 0827 (!) 26     Temp 01/20/19 0827 (!) 103 F (39.4 C)     Temp Source 01/20/19 0827 Oral     SpO2 01/20/19 0827 92 %     Weight 01/20/19 0828 74.8 kg (165 lb)     Height 01/20/19 0828 1.93 m (6\' 4" )     Head Circumference --      Peak Flow --      Pain Score 01/20/19 0828 5     Pain Loc --      Pain Edu? --      Excl. in [place]? --     Constitutional: Alert and oriented.  Eyes: Conjunctivae are normal.   Nose: No congestion/rhinnorhea. Mouth/Throat: Mucous membranes are moist.    Cardiovascular: Normal rate, regular rhythm. Grossly normal heart sounds.  Good peripheral circulation. Respiratory: Normal respiratory effort.  No retractions.  Bibasilar Rales, no wheezing Gastrointestinal: Soft and nontender. No distention.    Musculoskeletal:  Warm and well perfused Neurologic:  Normal speech and language. No gross focal neurologic deficits are appreciated.  Skin:  Skin is warm, dry and intact. No rash noted. Psychiatric: Mood and affect are normal. Speech and behavior are normal.  ____________________________________________   LABS (all labs ordered are listed, but only abnormal results are displayed)  Labs Reviewed  COMPREHENSIVE METABOLIC PANEL - Abnormal; Notable for the following components:      Result Value   CO2 19 (*)    Glucose, Bld 108 (*)    BUN 41 (*)    Creatinine, Ser 2.78 (*)    GFR calc non Af Amer 23 (*)    GFR calc Af Amer 27 (*)    All other components within normal limits  CBC WITH DIFFERENTIAL/PLATELET - Abnormal; Notable for the following components:   WBC 14.6 (*)    Neutro Abs 13.7 (*)    Lymphs Abs 0.3 (*)    Abs Immature Granulocytes 0.16 (*)    All other components within normal limits  SARS CORONAVIRUS 2 (HOSPITAL ORDER, [place] [facility])  CULTURE, BLOOD (ROUTINE X 2)  CULTURE, BLOOD (ROUTINE X 2)  GASTROINTESTINAL PANEL BY  PCR, STOOL (REPLACES STOOL CULTURE)  LACTIC ACID, PLASMA  PROTIME-INR  PROCALCITONIN  MAGNESIUM  LACTIC ACID, PLASMA  URINALYSIS, COMPLETE (UACMP) WITH MICROSCOPIC   ____________________________________________  EKG  ED ECG REPORT I, Lavonia Drafts, the attending physician, personally viewed and interpreted this ECG.  Date: 01/20/2019  Rhythm: Sinus tachycardia QRS Axis: normal Intervals: Bundle branch block ST/T Wave abnormalities: normal Narrative Interpretation: no evidence of acute ischemia  ____________________________________________  RADIOLOGY  Chest x-ray shows interstitial prominence ____________________________________________   PROCEDURES  Procedure(s) performed: No  Procedures   Critical Care performed: No ____________________________________________   INITIAL IMPRESSION / ASSESSMENT AND PLAN / ED COURSE  Pertinent labs & imaging results that were available during my care of the patient were reviewed by me and considered in my medical decision making (see chart for details).  Patient presents with cough, fever, nausea, somewhat concerning for novel coronavirus versus pneumonia.  Does complain of intermittent abdominal cramping as well seems to be related to nausea however.  There are sick housemates with cough.  We will send rapid coronavirus test, chest x-ray, labs including blood cultures  Lab work demonstrates acute on chronic kidney injury, elevated white blood cell count  Coronavirus test is negative, will cover with antibiotics and admit to the hospital service    ____________________________________________   FINAL CLINICAL IMPRESSION(S) / ED DIAGNOSES  Final diagnoses:  Community acquired pneumonia, unspecified laterality  Acute kidney injury ([place])        Note:  This document was prepared using Dragon voice recognition software and may include unintentional dictation errors.   Lavonia Drafts, MD 01/20/19 [phone]

## 2019-01-20 NOTE — ED Notes (Signed)
Pt transported to floor by transport tech at this time.

## 2019-01-20 NOTE — Progress Notes (Signed)
Family Meeting Note  Advance Directive:yes  Today a meeting took place with the Patient.   The following clinical team members were present during this meeting:MD  The following were discussed:Patient's diagnosis: Sepsis, pneumonia, acute on chronic renal failure stage III, Patient's progosis: Unable to determine and Goals for treatment: Full Code  Additional follow-up to be provided: PMD  Time spent during discussion:20 minutes  Alan Basta, MD

## 2019-01-21 ENCOUNTER — Inpatient Hospital Stay: Payer: Medicare Other

## 2019-01-21 ENCOUNTER — Inpatient Hospital Stay: Payer: Medicare Other | Admitting: Anesthesiology

## 2019-01-21 ENCOUNTER — Encounter: Payer: Self-pay | Admitting: Anesthesiology

## 2019-01-21 ENCOUNTER — Encounter
Admission: EM | Disposition: A | Discharge status: Home Health | Payer: Self-pay | Source: Home / Self Care | Attending: Internal Medicine

## 2019-01-21 DIAGNOSIS — N132 Hydronephrosis with renal and ureteral calculous obstruction: Secondary | ICD-10-CM | POA: Diagnosis not present

## 2019-01-21 DIAGNOSIS — N179 Acute kidney failure, unspecified: Secondary | ICD-10-CM

## 2019-01-21 HISTORY — PX: CYSTOSCOPY W/ URETERAL STENT PLACEMENT: SHX1429

## 2019-01-21 LAB — BASIC METABOLIC PANEL
Anion gap: 7 (ref 5–15)
BUN: 54 mg/dL — ABNORMAL HIGH (ref 8–23)
CO2: 15 mmol/L — ABNORMAL LOW (ref 22–32)
Calcium: 8.5 mg/dL — ABNORMAL LOW (ref 8.9–10.3)
Chloride: 112 mmol/L — ABNORMAL HIGH (ref 98–111)
Creatinine, Ser: 5.06 mg/dL — ABNORMAL HIGH (ref 0.61–1.24)
GFR calc Af Amer: 13 mL/min — ABNORMAL LOW (ref >60)
GFR calc non Af Amer: 11 mL/min — ABNORMAL LOW (ref >60)
Glucose, Bld: 106 mg/dL — ABNORMAL HIGH (ref 70–99)
Potassium: 5.2 mmol/L — ABNORMAL HIGH (ref 3.5–5.1)
Sodium: 134 mmol/L — ABNORMAL LOW (ref 135–145)

## 2019-01-21 LAB — CBC
HCT: 42.8 % (ref 39.0–52.0)
Hemoglobin: 13.6 g/dL (ref 13.0–17.0)
MCH: 30.8 pg (ref 26.0–34.0)
MCHC: 31.8 g/dL (ref 30.0–36.0)
MCV: 96.8 fL (ref 80.0–100.0)
Platelets: 132 10*3/uL — ABNORMAL LOW (ref 150–400)
RBC: 4.42 MIL/uL (ref 4.22–5.81)
RDW: 13.6 % (ref 11.5–15.5)
WBC: 17 10*3/uL — ABNORMAL HIGH (ref 4.0–10.5)
nRBC: 0 % (ref 0.0–0.2)

## 2019-01-21 SURGERY — CYSTOSCOPY, WITH RETROGRADE PYELOGRAM AND URETERAL STENT INSERTION
Anesthesia: General | Site: Ureter | Laterality: Left

## 2019-01-21 MED ORDER — ONDANSETRON HCL 4 MG/2ML IJ SOLN: 4.0000 mg | Freq: Once | INTRAMUSCULAR | Status: DC | PRN

## 2019-01-21 MED ORDER — SUCCINYLCHOLINE CHLORIDE 20 MG/ML IJ SOLN
INTRAMUSCULAR | Status: DC | PRN
  Administered 2019-01-21: 100 mg via INTRAVENOUS

## 2019-01-21 MED ORDER — FENTANYL CITRATE (PF) 100 MCG/2ML IJ SOLN
INTRAMUSCULAR | Status: AC
  Filled 2019-01-21: qty 2

## 2019-01-21 MED ORDER — ONDANSETRON HCL 4 MG/2ML IJ SOLN
INTRAMUSCULAR | Status: AC
  Filled 2019-01-21: qty 2

## 2019-01-21 MED ORDER — FENTANYL CITRATE (PF) 100 MCG/2ML IJ SOLN: 25.0000 ug | INTRAMUSCULAR | Status: DC | PRN

## 2019-01-21 MED ORDER — DEXAMETHASONE SODIUM PHOSPHATE 10 MG/ML IJ SOLN
INTRAMUSCULAR | Status: DC | PRN
  Administered 2019-01-21: 4 mg via INTRAVENOUS

## 2019-01-21 MED ORDER — FENTANYL CITRATE (PF) 100 MCG/2ML IJ SOLN
INTRAMUSCULAR | Status: DC | PRN
  Administered 2019-01-21 (×2): 50 ug via INTRAVENOUS

## 2019-01-21 MED ORDER — IPRATROPIUM-ALBUTEROL 0.5-2.5 (3) MG/3ML IN SOLN
3.0000 mL | Freq: Once | RESPIRATORY_TRACT | Status: AC
  Administered 2019-01-21: 3 mL via RESPIRATORY_TRACT

## 2019-01-21 MED ORDER — LIDOCAINE HCL (PF) 2 % IJ SOLN
INTRAMUSCULAR | Status: AC
  Filled 2019-01-21: qty 10

## 2019-01-21 MED ORDER — PROPOFOL 10 MG/ML IV BOLUS
INTRAVENOUS | Status: DC | PRN
  Administered 2019-01-21: 130 mg via INTRAVENOUS
  Administered 2019-01-21: 30 mg via INTRAVENOUS

## 2019-01-21 MED ORDER — LACTATED RINGERS IV SOLN
INTRAVENOUS | Status: DC | PRN
  Administered 2019-01-21: 17:00:00 via INTRAVENOUS

## 2019-01-21 MED ORDER — PHENYLEPHRINE HCL (PRESSORS) 10 MG/ML IV SOLN
INTRAVENOUS | Status: DC | PRN
  Administered 2019-01-21 (×3): 100 ug via INTRAVENOUS

## 2019-01-21 MED ORDER — MIDAZOLAM HCL 2 MG/2ML IJ SOLN
INTRAMUSCULAR | Status: DC | PRN
  Administered 2019-01-21: 2 mg via INTRAVENOUS

## 2019-01-21 MED ORDER — DEXAMETHASONE SODIUM PHOSPHATE 10 MG/ML IJ SOLN
INTRAMUSCULAR | Status: AC
  Filled 2019-01-21: qty 1

## 2019-01-21 MED ORDER — ONDANSETRON HCL 4 MG/2ML IJ SOLN
INTRAMUSCULAR | Status: DC | PRN
  Administered 2019-01-21: 4 mg via INTRAVENOUS

## 2019-01-21 MED ORDER — IPRATROPIUM-ALBUTEROL 0.5-2.5 (3) MG/3ML IN SOLN
RESPIRATORY_TRACT | Status: AC
  Administered 2019-01-21: 3 mL via RESPIRATORY_TRACT
  Filled 2019-01-21: qty 3

## 2019-01-21 MED ORDER — ROCURONIUM BROMIDE 100 MG/10ML IV SOLN
INTRAVENOUS | Status: DC | PRN
  Administered 2019-01-21: 5 mg via INTRAVENOUS

## 2019-01-21 MED ORDER — PATIROMER SORBITEX CALCIUM 8.4 G PO PACK
8.4000 g | PACK | Freq: Every day | ORAL | Status: DC
  Administered 2019-01-21 – 2019-01-23 (×3): 8.4 g via ORAL
  Filled 2019-01-21 (×4): qty 1

## 2019-01-21 MED ORDER — MIDAZOLAM HCL 2 MG/2ML IJ SOLN
INTRAMUSCULAR | Status: AC
  Filled 2019-01-21: qty 2

## 2019-01-21 MED ORDER — LIDOCAINE HCL (CARDIAC) PF 100 MG/5ML IV SOSY
PREFILLED_SYRINGE | INTRAVENOUS | Status: DC | PRN
  Administered 2019-01-21: 60 mg via INTRAVENOUS

## 2019-01-21 MED ORDER — PROPOFOL 10 MG/ML IV BOLUS
INTRAVENOUS | Status: AC
  Filled 2019-01-21: qty 20

## 2019-01-21 SURGICAL SUPPLY — 18 items
CATH SET URETHRAL DILATOR (CATHETERS) ×3 IMPLANT
CATH URETL 5X70 OPEN END (CATHETERS) ×3 IMPLANT
CONRAY 43 FOR UROLOGY 50M (MISCELLANEOUS) IMPLANT
COVER WAND RF STERILE (DRAPES) ×3 IMPLANT
GLOVE BIO SURGEON STRL SZ8 (GLOVE) ×3 IMPLANT
GOWN STRL REUS W/ TWL XL LVL3 (GOWN DISPOSABLE) ×1 IMPLANT
GOWN STRL REUS W/TWL XL LVL3 (GOWN DISPOSABLE) ×2
GUIDEWIRE STR DUAL SENSOR (WIRE) ×3 IMPLANT
KIT TURNOVER CYSTO (KITS) ×3 IMPLANT
OMNIPAQUE 300 100ML (MISCELLANEOUS) IMPLANT
PACK CYSTO AR (MISCELLANEOUS) ×3 IMPLANT
SET CYSTO W/LG BORE CLAMP LF (SET/KITS/TRAYS/PACK) ×3 IMPLANT
STENT URET 6FRX24 CONTOUR (STENTS) ×3 IMPLANT
STENT URET 6FRX26 CONTOUR (STENTS) IMPLANT
SURGILUBE 2OZ TUBE FLIPTOP (MISCELLANEOUS) ×3 IMPLANT
SYR 10ML LL (SYRINGE) ×3 IMPLANT
WATER STERILE IRR 1000ML POUR (IV SOLUTION) ×3 IMPLANT
WATER STERILE IRR 3000ML UROMA (IV SOLUTION) ×3 IMPLANT

## 2019-01-21 NOTE — Anesthesia Preprocedure Evaluation (Signed)
Anesthesia Evaluation  Patient identified by MRN, date of birth, ID band Patient awake    Reviewed: Allergy & Precautions, H&P , NPO status , Patient's Chart, lab work & pertinent test results, reviewed documented beta blocker date and time   History of Anesthesia Complications Negative for: history of anesthetic complications  Airway Mallampati: II  TM Distance: >3 FB Neck ROM: full    Dental  (+) Edentulous Upper, Edentulous Lower, Dental Advidsory Given   Pulmonary shortness of breath, asthma , pneumonia, resolved, COPD,  COPD inhaler, neg recent URI, Current Smoker,    Pulmonary exam normal        Cardiovascular Exercise Tolerance: Good (-) hypertension(-) angina+ CAD and + Past MI  (-) CABG Normal cardiovascular exam(-) dysrhythmias (-) Valvular Problems/Murmurs     Neuro/Psych Seizures -,  PSYCHIATRIC DISORDERS Depression Schizophrenia CVA    GI/Hepatic Neg liver ROS, PUD, GERD  ,  Endo/Other  diabetes, Type 2Hypothyroidism   Renal/GU CRFRenal disease  negative genitourinary   Musculoskeletal   Abdominal   Peds  Hematology negative hematology ROS (+)   Anesthesia Other Findings Past Medical History: No date: Asthma No date: COPD (chronic obstructive pulmonary disease) (H[facility]) No date: Depression No date: External hemorrhoids No date: Gastric ulcer No date: GERD (gastroesophageal reflux disease) No date: Hydronephrosis No date: Hypothyroidism     Comment:  hypo No date: Insomnia No date: Mental disorder No date: Myocardial infarction ([place]) No date: Paranoid schizophrenia ([place]) No date: Renal disorder     Comment:  renal failure No date: Seizures ([place]) No date: Stroke ([place])   Reproductive/Obstetrics negative OB ROS                             Anesthesia Physical  Anesthesia Plan  ASA: III  Anesthesia Plan: General   Post-op Pain Management:    Induction:  Intravenous  PONV Risk Score and Plan: 1 and Ondansetron, Dexamethasone, Midazolam and Treatment may vary due to age or medical condition  Airway Management Planned: Oral ETT  Additional Equipment:   Intra-op Plan:   Post-operative Plan: Extubation in OR  Informed Consent: I have reviewed the patients History and Physical, chart, labs and discussed the procedure including the risks, benefits and alternatives for the proposed anesthesia with the patient or authorized representative who has indicated his/her understanding and acceptance.     Dental Advisory Given  Plan Discussed with: Anesthesiologist, CRNA and Surgeon  Anesthesia Plan Comments:         Anesthesia Quick Evaluation

## 2019-01-21 NOTE — Consult Note (Signed)
Urology Consult  I have been asked to see the patient by Dr. Marthann Schiller for evaluation and management of mild hydronephrosis and a solitary kidney.  Chief Complaint: Mild hydronephrosis  History of Present Illness: Alan Small is a 63 y.o. year old male with PMH who presented to the ED via EMS yesterday with complaints of 1 day of chills, fever, nausea, vomiting, diarrhea, and cough.  He was febrile to 102.2.  Sepsis protocol initiated, COVID-19 test negative.  He was admitted with pneumonia and found to have an AKI with creatinine 2.78, white count 14.6.  Urinalysis with >50 WBC/HPF, >50 RBC/HPF, and many bacteria. Urine culture pending. Renal US with age indeterminant mild hydronephrosis.  Prior renal imaging as recently as 08/20/2018 without hydronephrosis.  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 He is followed by [facility].  Today, he reports feeling better than he did yesterday upon admission.  He does report some persistent abdominal pain.  He denies flank pain.  He is voiding.  He reports his last meal was this morning around 9 AM, last fluid intake was water at about 1:30 PM.  Anti-infectives (From admission, onward)   Start     Dose/Rate Route Frequency Ordered Stop   01/21/19 1000  cefTRIAXone (ROCEPHIN) 1 g in sodium chloride 0.9 % 100 mL IVPB     1 g 200 mL/hr over 30 Minutes Intravenous Every 24 hours 01/20/19 1046     01/21/19 1000  azithromycin (ZITHROMAX) 250 mg in dextrose 5 % 125 mL IVPB     250 mg 125 mL/hr over 60 Minutes Intravenous Every 24 hours 01/20/19 1046     01/20/19 1015  cefTRIAXone (ROCEPHIN) 2 g in sodium chloride 0.9 % 100 mL IVPB  Status:  Discontinued     2 g 200 mL/hr over 30 Minutes Intravenous Every 24 hours 01/20/19 1011 01/20/19 1047   01/20/19 1015  azithromycin (ZITHROMAX) 500 mg in sodium chloride 0.9 % 250 mL IVPB  Status:  Discontinued     500 mg 250 mL/hr over 60 Minutes Intravenous  Every 24 hours 01/20/19 1011 01/20/19 1048     Past Medical History:  Diagnosis Date   Asthma    COPD (chronic obstructive pulmonary disease) ([place])    Depression    External hemorrhoids    Gastric ulcer    GERD (gastroesophageal reflux disease)    Hydronephrosis    Hypothyroidism    hypo   Insomnia    Mental disorder    Myocardial infarction ([place])    Paranoid schizophrenia ([place])    Renal disorder    renal failure   Seizures ([place])    Stroke [facility])     Past Surgical History:  Procedure Laterality Date   APPENDECTOMY     COLONOSCOPY WITH PROPOFOL N/A 12/07/2014   Procedure: COLONOSCOPY WITH PROPOFOL;  Surgeon: Manya Silvas, MD;  Location[facility] ENDOSCOPY;  Service: Endoscopy;  Laterality: N/A;   COLONOSCOPY WITH PROPOFOL N/A 10/10/2016   Procedure: COLONOSCOPY WITH PROPOFOL;  Surgeon: Lucilla Lame, MD;  Location: [place];  Service: Endoscopy;  Laterality: N/A;   ESOPHAGOGASTRODUODENOSCOPY N/A 12/07/2014   Procedure: ESOPHAGOGASTRODUODENOSCOPY (EGD);  Surgeon: Manya Silvas, MD;  Location: [facility] ENDOSCOPY;  Service: Endoscopy;  Laterality: N/A;   NEPHRECTOMY     POLYPECTOMY  10/10/2016   Procedure: POLYPECTOMY;  Surgeon: Lucilla Lame, MD;  Location: [place];  Service: Endoscopy;;   SUPRAPUBIC CATHETER INSERTION  Home Medications:  Current Meds  Medication Sig   acetaminophen (TYLENOL) 500 MG tablet Take 1,000 mg by mouth 2 (two) times daily as needed for mild pain, moderate pain or fever.    albuterol-ipratropium (COMBIVENT) 18-103 MCG/ACT inhaler Inhale 1 puff into the lungs every 6 (six) hours as needed for wheezing or shortness of breath.    budesonide-formoterol (SYMBICORT) 160-4.5 MCG/ACT inhaler Inhale 1 puff into the lungs 2 (two) times daily.    FLUoxetine (PROZAC) 20 MG capsule Take 60 mg by mouth every morning.   hydrocortisone (ANUSOL-HC) 25 MG suppository Place 25 mg rectally 2 (two) times daily as needed for  hemorrhoids or anal itching.   INVEGA SUSTENNA 156 MG/ML SUSY injection Inject 156 mg into the muscle every 30 (thirty) days.   levothyroxine (SYNTHROID, LEVOTHROID) 50 MCG tablet Take 50 mcg by mouth daily before breakfast.   sodium bicarbonate 650 MG tablet Take 650 mg by mouth 2 (two) times daily.    tamsulosin (FLOMAX) 0.4 MG CAPS capsule Take 0.4 mg by mouth every morning.     Allergies:  Allergies  Allergen Reactions   Mellaril [Thioridazine] Shortness Of Breath and Swelling   Buprenorphine Itching   Morphine And Related Itching   Navane [Thiothixene] Other (See Comments)    Reaction:  GI upset    Thorazine [Chlorpromazine] Other (See Comments)    Reaction:  Dizziness/fainting     Family History  Problem Relation Age of Onset   Stroke Father    Pneumonia Father    Brain cancer Mother     Social History:  reports that he has been smoking cigarettes. He has a 55.00 pack-year smoking history. He has quit using smokeless tobacco.  His smokeless tobacco use included chew. He reports that he does not drink alcohol or use drugs.  ROS: A complete review of systems was performed.  All systems are negative except for pertinent findings as noted.  Physical Exam:  Vital signs in last 24 hours: Temp:  [98.3 F (36.8 C)-99.5 F (37.5 C)] 98.3 F (36.8 C) (08/21 0518) Pulse Rate:  [89-103] 89 (08/21 0518) Resp:  [13-20] 16 (08/21 0518) BP: (105-140)/(65-83) 105/71 (08/21 0518) SpO2:  [96 %-99 %] 96 % (08/21 0518) Constitutional:  Alert and oriented, no acute distress HEENT: [place] AT Cardiovascular: No clubbing, cyanosis, or edema. Respiratory: Normal respiratory effort GI: Abdomen soft and tender along the left lower quadrant.   Skin: No rashes, bruises or suspicious lesions Neurologic: Grossly intact, no focal deficits, moving all 4 extremities Psychiatric: Normal mood and affect   Laboratory Data:  Recent Labs    01/20/19 0844 01/21/19 0544  WBC 14.6* 17.0*    HGB 14.4 13.6  HCT 43.2 42.8   Recent Labs    01/20/19 0844 01/21/19 0544  NA 138 134*  K 4.4 5.2*  CL 110 112*  CO2 19* 15*  GLUCOSE 108* 106*  BUN 41* 54*  CREATININE 2.78* 5.06*  CALCIUM 9.4 8.5*   Recent Labs    01/20/19 0844  INR 1.1   Urinalysis    Component Value Date/Time   COLORURINE YELLOW (A) 01/20/2019 2202   APPEARANCEUR CLOUDY (A) 01/20/2019 2202   APPEARANCEUR Cloudy (A) 12/01/2018 0927   LABSPEC 1.014 01/20/2019 2202   LABSPEC 1.010 02/16/2014 1650   PHURINE 7.0 01/20/2019 [place] 01/20/2019 2202   GLUCOSEU Negative 02/16/2014 1650   HGBUR LARGE (A) 01/20/2019 2202   BILIRUBINUR NEGATIVE 01/20/2019 2202   BILIRUBINUR Negative 12/01/2018 0927  BILIRUBINUR Negative 02/16/2014 [place] 01/20/2019 2202   PROTEINUR 100 (A) 01/20/2019 2202   UROBILINOGEN 0.2 11/07/2012 1049   NITRITE NEGATIVE 01/20/2019 2202   LEUKOCYTESUR LARGE (A) 01/20/2019 2202   LEUKOCYTESUR 3+ 02/16/2014 1650   Results for orders placed or performed during the hospital encounter of 01/20/19  SARS Coronavirus 2 [facility] order, Performed in [facility] hospital lab) Nasopharyngeal Nasopharyngeal Swab     Status: None   Collection Time: 01/20/19  8:44 AM   Specimen: Nasopharyngeal Swab  Result Value Ref Range Status   SARS Coronavirus 2 NEGATIVE NEGATIVE Final    Comment: (NOTE) If result is NEGATIVE SARS-CoV-2 target nucleic acids are NOT DETECTED. The SARS-CoV-2 RNA is generally detectable in upper and lower  respiratory specimens during the acute phase of infection. The lowest  concentration of SARS-CoV-2 viral copies this assay can detect is 250  copies / mL. A negative result does not preclude SARS-CoV-2 infection  and should not be used as the sole basis for treatment or other  patient management decisions.  A negative result may occur with  improper specimen collection / handling, submission of specimen other  than nasopharyngeal  swab, presence of viral mutation(s) within the  areas targeted by this assay, and inadequate number of viral copies  (<250 copies / mL). A negative result must be combined with clinical  observations, patient history, and epidemiological information. If result is POSITIVE SARS-CoV-2 target nucleic acids are DETECTED. The SARS-CoV-2 RNA is generally detectable in upper and lower  respiratory specimens dur ing the acute phase of infection.  Positive  results are indicative of active infection with SARS-CoV-2.  Clinical  correlation with patient history and other diagnostic information is  necessary to determine patient infection status.  Positive results do  not rule out bacterial infection or co-infection with other viruses. If result is PRESUMPTIVE POSTIVE SARS-CoV-2 nucleic acids MAY BE PRESENT.   A presumptive positive result was obtained on the submitted specimen  and confirmed on repeat testing.  While 2019 novel coronavirus  (SARS-CoV-2) nucleic acids may be present in the submitted sample  additional confirmatory testing may be necessary for epidemiological  and / or clinical management purposes  to differentiate between  SARS-CoV-2 and other Sarbecovirus currently known to infect humans.  If clinically indicated additional testing with an alternate test  methodology [phone]) is advised. The SARS-CoV-2 RNA is generally  detectable in upper and lower respiratory sp ecimens during the acute  phase of infection. The expected result is Negative. Fact Sheet for Patients:  StrictlyIdeas.no Fact Sheet for Healthcare Providers: BankingDealers.co.za This test is not yet approved or cleared by the [place] FDA and has been authorized for detection and/or diagnosis of SARS-CoV-2 by FDA under an Emergency Use Authorization (EUA).  This EUA will remain in effect (meaning this test can be used) for the duration of the COVID-19 declaration  under Section 564(b)(1) of the Act, 21 U.S.C. section 360bbb-3(b)(1), unless the authorization is terminated or revoked sooner. Performed at [facility], [address] [place],  [address]   Culture, blood (Routine x 2)     Status: None (Preliminary result)   Collection Time: 01/20/19  8:45 AM   Specimen: BLOOD  Result Value Ref Range Status   Specimen Description BLOOD LEFT AC  Final   Special Requests   Final    BOTTLES DRAWN AEROBIC AND ANAEROBIC Blood Culture results may not be optimal due to an excessive volume of blood received  in culture bottles   Culture   Final    NO GROWTH < 24 HOURS Performed at [facility], [address] [place], [place] [address]    Report Status PENDING  Incomplete  Culture, blood (Routine x 2)     Status: None (Preliminary result)   Collection Time: 01/20/19  8:45 AM   Specimen: BLOOD  Result Value Ref Range Status   Specimen Description BLOOD RIGHT [facility]  Final   Special Requests   Final    BOTTLES DRAWN AEROBIC AND ANAEROBIC Blood Culture results may not be optimal due to an excessive volume of blood received in culture bottles   Culture   Final    NO GROWTH < 24 HOURS Performed at [facility], [address] [place], [place] [address]    Report Status PENDING  Incomplete  Gastrointestinal Panel by PCR , Stool     Status: None   Collection Time: 01/20/19  2:04 PM   Specimen: Stool  Result Value Ref Range Status   Campylobacter species NOT DETECTED NOT DETECTED Final   Plesimonas shigelloides NOT DETECTED NOT DETECTED Final   Salmonella species NOT DETECTED NOT DETECTED Final   Yersinia enterocolitica NOT DETECTED NOT DETECTED Final   Vibrio species NOT DETECTED NOT DETECTED Final   Vibrio cholerae NOT DETECTED NOT DETECTED Final   Enteroaggregative E coli (EAEC) NOT DETECTED NOT DETECTED Final   Enteropathogenic E coli (EPEC) NOT DETECTED NOT DETECTED Final   Enterotoxigenic E coli (ETEC) NOT DETECTED  NOT DETECTED Final   Shiga like toxin producing E coli (STEC) NOT DETECTED NOT DETECTED Final   Shigella/Enteroinvasive E coli (EIEC) NOT DETECTED NOT DETECTED Final   Cryptosporidium NOT DETECTED NOT DETECTED Final   Cyclospora cayetanensis NOT DETECTED NOT DETECTED Final   Entamoeba histolytica NOT DETECTED NOT DETECTED Final   Giardia lamblia NOT DETECTED NOT DETECTED Final   Adenovirus F40/41 NOT DETECTED NOT DETECTED Final   Astrovirus NOT DETECTED NOT DETECTED Final   Norovirus GI/GII NOT DETECTED NOT DETECTED Final   Rotavirus A NOT DETECTED NOT DETECTED Final   Sapovirus (I, II, IV, and V) NOT DETECTED NOT DETECTED Final    Comment: Performed at [facility], [address] [place], [place] [address]  C difficile quick scan w PCR reflex     Status: None   Collection Time: 01/20/19  2:04 PM   Specimen: STOOL  Result Value Ref Range Status   C Diff antigen NEGATIVE NEGATIVE Final   C Diff toxin NEGATIVE NEGATIVE Final   C Diff interpretation No C. difficile detected.  Final    Comment: Performed at [facility], [address] [place], [place] [address]  MRSA PCR Screening     Status: Abnormal   Collection Time: 01/20/19  4:48 PM   Specimen: Nasopharyngeal  Result Value Ref Range Status   MRSA by PCR POSITIVE (A) NEGATIVE Final    Comment:        The GeneXpert MRSA Assay (FDA approved for NASAL specimens only), is one component of a comprehensive MRSA colonization surveillance program. It is not intended to diagnose MRSA infection nor to guide or monitor treatment for MRSA infections. RESULT CALLED TO, READ BACK BY AND VERIFIED WITH: DEDRA [facility] 01/20/19 @ 1857  Martin Performed at [facility], [address], [place], [place] [address]     Radiologic Imaging: US Renal  Result Date: 01/21/2019 CLINICAL DATA:  Acute renal failure. EXAM: RENAL / URINARY TRACT ULTRASOUND  COMPLETE COMPARISON:  None. FINDINGS: Right Kidney: The patient is  status post right nephrectomy. Left Kidney: Renal measurements: 11.0 x 5.6 x 6.1 cm = volume: 200 mL. Two large stones are seen in the left kidney with the largest measuring 4.1 cm. Mild hydronephrosis is identified, age indeterminate. Bladder: Appears normal for degree of bladder distention. IMPRESSION: 1. Two large stones are seen in the left kidney with the largest measuring 4.1 cm. There is mild hydronephrosis on the left was is age indeterminate. Recommend clinical correlation. Electronically Signed   By: Dorise Bullion III M.D   On: 01/21/2019 13:31   I personally reviewed the above imaging and agree with radiologic findings.  Assessment & Plan:  Patient presents with mild hydronephrosis in a solitary kidney.  Onset indeterminate, however this was not present in imaging as recently as 5 months ago.  Coupled with AKI, intervention is warranted at this time.   We will plan to proceed to the OR for cystoscopy with left retrograde pyelogram and left ureteral stent placement this afternoon.  He has been n.p.o. since approximately 9 AM with some fluid intake since.   I counseled the patient that stents can cause pain in the flank or groin and/or gross hematuria. I informed him that he may expect to experience these symptoms for the duration of the stent being in place. I counseled him that he should follow up with [place] urgently if he develops new fever, chills, nausea, or vomiting before it is removed, as these are not typical symptoms associated with a stent. He expressed understanding of this plan.  -NPO -Cystoscopy with left retrograde pyelogram and left ureteral stent placement this afternoon  Thank you for involving me in this patient's care, I will continue to follow along.  Debroah Loop, PA-C 01/21/2019 2:32 PM

## 2019-01-21 NOTE — Anesthesia Procedure Notes (Signed)
Procedure Name: Intubation Date/Time: 01/21/2019 5:32 PM Performed by: Dionne Bucy, CRNA Pre-anesthesia Checklist: Patient identified, Patient being monitored, Timeout performed, Emergency Drugs available and Suction available Patient Re-evaluated:Patient Re-evaluated prior to induction Oxygen Delivery Method: Circle system utilized Preoxygenation: Pre-oxygenation with 100% oxygen Induction Type: IV induction Ventilation: Mask ventilation without difficulty Laryngoscope Size: 3 and McGraph Grade View: Grade I Tube type: Oral Tube size: 7.0 mm Number of attempts: 1 Airway Equipment and Method: Stylet and Video-laryngoscopy Placement Confirmation: ETT inserted through vocal cords under direct vision,  positive ETCO2 and breath sounds checked- equal and bilateral Secured at: 23 cm Tube secured with: Tape Dental Injury: Teeth and Oropharynx as per pre-operative assessment

## 2019-01-21 NOTE — Anesthesia Post-op Follow-up Note (Signed)
Anesthesia QCDR form completed.        

## 2019-01-21 NOTE — Consult Note (Signed)
[facility]  CONSULT NOTE    Date: 01/21/2019                  Patient Name:  Alan Small  MRN: [id number]  DOB: 08/07/1955  Age / Sex: 63 y.o., male         PCP: [name], MD                 Service Requesting Consult: Dr. Marthann Schiller                 Reason for Consult: Acute renal failure            History of Present Illness: [name] presents to [facility] for fever and chills. Found to have pneumonia. Place on normal saline. Found to have obstructive uropathy. Patient is scheduled for cystoscopy and pyleogram later today.   Patient states he is having shortness of breath and chest pain currently.   [facility]. Last seen 04/2018  Medications: Outpatient medications: Medications Prior to Admission  Medication Sig Dispense Refill Last Dose  . acetaminophen (TYLENOL) 500 MG tablet Take 1,000 mg by mouth 2 (two) times daily as needed for mild pain, moderate pain or fever.    Unknown at PRN  . albuterol-ipratropium (COMBIVENT) 18-103 MCG/ACT inhaler Inhale 1 puff into the lungs every 6 (six) hours as needed for wheezing or shortness of breath.    Unknown at PRN  . budesonide-formoterol (SYMBICORT) 160-4.5 MCG/ACT inhaler Inhale 1 puff into the lungs 2 (two) times daily.    01/19/2019 at Unknown time  . FLUoxetine (PROZAC) 20 MG capsule Take 60 mg by mouth every morning.   01/19/2019 at 0800  . hydrocortisone (ANUSOL-HC) 25 MG suppository Place 25 mg rectally 2 (two) times daily as needed for hemorrhoids or anal itching.   Unknown at PRN  . INVEGA SUSTENNA 156 MG/ML SUSY injection Inject 156 mg into the muscle every 30 (thirty) days.   01/19/2019 at Unknown time  . levothyroxine (SYNTHROID, LEVOTHROID) 50 MCG tablet Take 50 mcg by mouth daily before breakfast.   01/19/2019 at 0800  . sodium bicarbonate 650 MG tablet Take 650 mg by mouth 2 (two) times daily.    01/19/2019 at 0800  . tamsulosin (FLOMAX) 0.4 MG CAPS capsule Take 0.4  mg by mouth every morning.    01/19/2019 at 0800  . nystatin (MYCOSTATIN/NYSTOP) powder Apply topically 2 (two) times daily. (Patient not taking: Reported on 01/20/2019) 30 g 0 Not Taking at Unknown time    Current medications: Current Facility-Administered Medications  Medication Dose Route Frequency Provider Last Rate Last Dose  . 0.9 %  sodium chloride infusion   Intravenous Continuous Vaughan Basta, MD 75 mL/hr at 01/21/19 0547    . acetaminophen (TYLENOL) tablet 1,000 mg  1,000 mg Oral BID PRN Vaughan Basta, MD   1,000 mg at 01/20/19 1410  . azithromycin (ZITHROMAX) 250 mg in dextrose 5 % 125 mL IVPB  250 mg Intravenous Q24H Vaughan Basta, MD   Stopped at 01/21/19 1139  . cefTRIAXone (ROCEPHIN) 1 g in sodium chloride 0.9 % 100 mL IVPB  1 g Intravenous Q24H Vaughan Basta, MD   Stopped at 01/21/19 1139  . Chlorhexidine Gluconate Cloth 2 % PADS 6 each  6 each Topical Q0600 Vaughan Basta, MD   6 each at 01/21/19 0544  . docusate sodium (COLACE) capsule 100 mg  100 mg Oral BID PRN Vaughan Basta, MD      .  FLUoxetine (PROZAC) capsule 60 mg  60 mg Oral Marlou Sa, Rosalio Macadamia, MD   60 mg at 01/21/19 0544  . heparin injection 5,000 Units  5,000 Units Subcutaneous Q8H Vaughan Basta, MD   5,000 Units at 01/21/19 0545  . hydrocortisone (ANUSOL-HC) suppository 25 mg  25 mg Rectal BID PRN Vaughan Basta, MD      . ipratropium-albuterol (DUONEB) 0.5-2.5 (3) MG/3ML nebulizer solution 3 mL  3 mL Inhalation Q6H PRN Vaughan Basta, MD      . levothyroxine (SYNTHROID) tablet 50 mcg  50 mcg Oral QAC breakfast Vaughan Basta, MD   50 mcg at 01/21/19 0545  . mometasone-formoterol (DULERA) 200-5 MCG/ACT inhaler 2 puff  2 puff Inhalation BID Vaughan Basta, MD   2 puff at 01/21/19 1024  . mupirocin ointment (BACTROBAN) 2 % 1 application  1 application Nasal BID Vaughan Basta, MD   1 application at 27/03/50  1024  . oxyCODONE-acetaminophen (PERCOCET/ROXICET) 5-325 MG per tablet 1 tablet  1 tablet Oral Q6H PRN Vaughan Basta, MD   1 tablet at 01/21/19 [phone]  . paliperidone (INVEGA SUSTENNA) injection 156 mg  156 mg Intramuscular Q30 days Vaughan Basta, MD      . patiromer [facility]) packet 8.4 g  8.4 g Oral Daily Vaughan Basta, MD      . tamsulosin (FLOMAX) capsule 0.4 mg  0.4 mg Oral Marlou Sa, Rosalio Macadamia, MD   0.4 mg at 01/21/19 0545      Allergies: Allergies  Allergen Reactions  . Mellaril [Thioridazine] Shortness Of Breath and Swelling  . Buprenorphine Itching  . Morphine And Related Itching  . Navane [Thiothixene] Other (See Comments)    Reaction:  GI upset   . Thorazine [Chlorpromazine] Other (See Comments)    Reaction:  Dizziness/fainting       Past Medical History: Past Medical History:  Diagnosis Date  . Asthma   . COPD (chronic obstructive pulmonary disease) ([place])   . Depression   . External hemorrhoids   . Gastric ulcer   . GERD (gastroesophageal reflux disease)   . Hydronephrosis   . Hypothyroidism    hypo  . Insomnia   . Mental disorder   . Myocardial infarction ([place])   . Paranoid schizophrenia ([place])   . Renal disorder    renal failure  . Seizures ([place])   . Stroke [facility])      Past Surgical History: Past Surgical History:  Procedure Laterality Date  . APPENDECTOMY    . COLONOSCOPY WITH PROPOFOL N/A 12/07/2014   Procedure: COLONOSCOPY WITH PROPOFOL;  Surgeon: Manya Silvas, MD;  Location[facility] ENDOSCOPY;  Service: Endoscopy;  Laterality: N/A;  . COLONOSCOPY WITH PROPOFOL N/A 10/10/2016   Procedure: COLONOSCOPY WITH PROPOFOL;  Surgeon: Lucilla Lame, MD;  Location: [place];  Service: Endoscopy;  Laterality: N/A;  . ESOPHAGOGASTRODUODENOSCOPY N/A 12/07/2014   Procedure: ESOPHAGOGASTRODUODENOSCOPY (EGD);  Surgeon: Manya Silvas, MD;  Location: [facility] ENDOSCOPY;  Service: Endoscopy;  Laterality: N/A;  . NEPHRECTOMY     . POLYPECTOMY  10/10/2016   Procedure: POLYPECTOMY;  Surgeon: Lucilla Lame, MD;  Location: [place];  Service: Endoscopy;;  . SUPRAPUBIC CATHETER INSERTION       Family History: Family History  Problem Relation Age of Onset  . Stroke Father   . Pneumonia Father   . Brain cancer Mother      Social History: Social History   Socioeconomic History  . Marital status: Single    Spouse name: Not on file  . Number of children:  Not on file  . Years of education: Not on file  . Highest education level: Not on file  Occupational History  . Not on file  Social Needs  . Financial resource strain: Not on file  . Food insecurity    Worry: Not on file    Inability: Not on file  . Transportation needs    Medical: Not on file    Non-medical: Not on file  Tobacco Use  . Smoking status: Current Every Day Smoker    Packs/day: 1.00    Years: 55.00    Pack years: 55.00    Types: Cigarettes  . Smokeless tobacco: Former Systems developer    Types: Chew  Substance and Sexual Activity  . Alcohol use: No  . Drug use: No  . Sexual activity: Not on file  Lifestyle  . Physical activity    Days per week: Not on file    Minutes per session: Not on file  . Stress: Not on file  Relationships  . Social Herbalist on phone: Not on file    Gets together: Not on file    Attends religious service: Not on file    Active member of club or organization: Not on file    Attends meetings of clubs or organizations: Not on file    Relationship status: Not on file  . Intimate partner violence    Fear of current or ex partner: Not on file    Emotionally abused: Not on file    Physically abused: Not on file    Forced sexual activity: Not on file  Other Topics Concern  . Not on file  Social History Narrative  . Not on file     Review of Systems: Review of Systems  Constitutional: Positive for chills, fever, malaise/fatigue and weight loss. Negative for diaphoresis.  HENT: Negative.   Negative for congestion, ear discharge, ear pain, hearing loss, nosebleeds, sinus pain, sore throat and tinnitus.   Eyes: Negative.  Negative for blurred vision, double vision, photophobia, pain, discharge and redness.  Respiratory: Positive for cough, sputum production, shortness of breath and wheezing. Negative for hemoptysis and stridor.   Cardiovascular: Positive for chest pain. Negative for palpitations, orthopnea, claudication, leg swelling and PND.  Gastrointestinal: Negative.  Negative for abdominal pain, blood in stool, constipation, diarrhea, heartburn, melena, nausea and vomiting.  Genitourinary: Negative.  Negative for dysuria, flank pain, frequency, hematuria and urgency.  Musculoskeletal: Negative.  Negative for back pain, falls, joint pain, myalgias and neck pain.  Skin: Negative.  Negative for itching and rash.  Neurological: Negative.  Negative for dizziness, tingling, tremors, sensory change, speech change, focal weakness, seizures, loss of consciousness, weakness and headaches.  Endo/Heme/Allergies: Negative.  Negative for environmental allergies and polydipsia. Does not bruise/bleed easily.  Psychiatric/Behavioral: Negative.  Negative for depression, hallucinations, memory loss, substance abuse and suicidal ideas. The patient is not nervous/anxious and does not have insomnia.     Vital Signs: Blood pressure 105/71, pulse 89, temperature 98.3 F (36.8 C), temperature source Oral, resp. rate 16, height 6\' 4"  (1.93 m), weight 74.8 kg, SpO2 96 %.  Weight trends: Filed Weights   01/20/19 0828  Weight: 74.8 kg    Physical Exam: General: NAD, laying in bed  Head: Normocephalic, atraumatic. Moist oral mucosal membranes  Eyes: Anicteric, PERRL  Neck: Supple, trachea midline  Lungs:  Bilateral crackles  Heart: Regular rate and rhythm  Abdomen:  Soft, nontender,   Extremities: no peripheral edema.  Neurologic: Nonfocal,  moving all four extremities  Skin: No lesions          Lab results: Basic Metabolic Panel: Recent Labs  Lab 01/20/19 0844 01/21/19 0544  NA 138 134*  K 4.4 5.2*  CL 110 112*  CO2 19* 15*  GLUCOSE 108* 106*  BUN 41* 54*  CREATININE 2.78* 5.06*  CALCIUM 9.4 8.5*  MG 1.8  --     Liver Function Tests: Recent Labs  Lab 01/20/19 0844  AST 26  ALT 18  ALKPHOS 105  BILITOT 0.9  PROT 7.2  ALBUMIN 3.6   No results for input(s): LIPASE, AMYLASE in the last 168 hours. No results for input(s): AMMONIA in the last 168 hours.  CBC: Recent Labs  Lab 01/20/19 0844 01/21/19 0544  WBC 14.6* 17.0*  NEUTROABS 13.7*  --   HGB 14.4 13.6  HCT 43.2 42.8  MCV 92.1 96.8  PLT 156 132*    Cardiac Enzymes: No results for input(s): CKTOTAL, CKMB, CKMBINDEX, TROPONINI in the last 168 hours.  BNP: Invalid input(s): POCBNP  CBG: No results for input(s): GLUCAP in the last 168 hours.  Microbiology: Results for orders placed or performed during the hospital encounter of 01/20/19  SARS Coronavirus 2 [facility] order, Performed in [facility] hospital lab) Nasopharyngeal Nasopharyngeal Swab     Status: None   Collection Time: 01/20/19  8:44 AM   Specimen: Nasopharyngeal Swab  Result Value Ref Range Status   SARS Coronavirus 2 NEGATIVE NEGATIVE Final    Comment: (NOTE) If result is NEGATIVE SARS-CoV-2 target nucleic acids are NOT DETECTED. The SARS-CoV-2 RNA is generally detectable in upper and lower  respiratory specimens during the acute phase of infection. The lowest  concentration of SARS-CoV-2 viral copies this assay can detect is 250  copies / mL. A negative result does not preclude SARS-CoV-2 infection  and should not be used as the sole basis for treatment or other  patient management decisions.  A negative result may occur with  improper specimen collection / handling, submission of specimen other  than nasopharyngeal swab, presence of viral mutation(s) within the  areas targeted by this assay, and inadequate number of  viral copies  (<250 copies / mL). A negative result must be combined with clinical  observations, patient history, and epidemiological information. If result is POSITIVE SARS-CoV-2 target nucleic acids are DETECTED. The SARS-CoV-2 RNA is generally detectable in upper and lower  respiratory specimens dur ing the acute phase of infection.  Positive  results are indicative of active infection with SARS-CoV-2.  Clinical  correlation with patient history and other diagnostic information is  necessary to determine patient infection status.  Positive results do  not rule out bacterial infection or co-infection with other viruses. If result is PRESUMPTIVE POSTIVE SARS-CoV-2 nucleic acids MAY BE PRESENT.   A presumptive positive result was obtained on the submitted specimen  and confirmed on repeat testing.  While 2019 novel coronavirus  (SARS-CoV-2) nucleic acids may be present in the submitted sample  additional confirmatory testing may be necessary for epidemiological  and / or clinical management purposes  to differentiate between  SARS-CoV-2 and other Sarbecovirus currently known to infect humans.  If clinically indicated additional testing with an alternate test  methodology [phone]) is advised. The SARS-CoV-2 RNA is generally  detectable in upper and lower respiratory sp ecimens during the acute  phase of infection. The expected result is Negative. Fact Sheet for Patients:  StrictlyIdeas.no Fact Sheet for Healthcare Providers: BankingDealers.co.za This test is not  yet approved or cleared by the [place] and has been authorized for detection and/or diagnosis of SARS-CoV-2 by FDA under an Emergency Use Authorization (EUA).  This EUA will remain in effect (meaning this test can be used) for the duration of the COVID-19 declaration under Section 564(b)(1) of the Act, 21 U.S.C. section 360bbb-3(b)(1), unless the authorization is  terminated or revoked sooner. Performed at [facility], [address] [place], [place] [address]   Culture, blood (Routine x 2)     Status: None (Preliminary result)   Collection Time: 01/20/19  8:45 AM   Specimen: BLOOD  Result Value Ref Range Status   Specimen Description BLOOD LEFT AC  Final   Special Requests   Final    BOTTLES DRAWN AEROBIC AND ANAEROBIC Blood Culture results may not be optimal due to an excessive volume of blood received in culture bottles   Culture   Final    NO GROWTH < 24 HOURS Performed at [facility], [address] [place], [place] [address]    Report Status PENDING  Incomplete  Culture, blood (Routine x 2)     Status: None (Preliminary result)   Collection Time: 01/20/19  8:45 AM   Specimen: BLOOD  Result Value Ref Range Status   Specimen Description BLOOD RIGHT [facility]  Final   Special Requests   Final    BOTTLES DRAWN AEROBIC AND ANAEROBIC Blood Culture results may not be optimal due to an excessive volume of blood received in culture bottles   Culture   Final    NO GROWTH < 24 HOURS Performed at [facility], [address] [place], [place] [address]    Report Status PENDING  Incomplete  Gastrointestinal Panel by PCR , Stool     Status: None   Collection Time: 01/20/19  2:04 PM   Specimen: Stool  Result Value Ref Range Status   Campylobacter species NOT DETECTED NOT DETECTED Final   Plesimonas shigelloides NOT DETECTED NOT DETECTED Final   Salmonella species NOT DETECTED NOT DETECTED Final   Yersinia enterocolitica NOT DETECTED NOT DETECTED Final   Vibrio species NOT DETECTED NOT DETECTED Final   Vibrio cholerae NOT DETECTED NOT DETECTED Final   Enteroaggregative E coli (EAEC) NOT DETECTED NOT DETECTED Final   Enteropathogenic E coli (EPEC) NOT DETECTED NOT DETECTED Final   Enterotoxigenic E coli (ETEC) NOT DETECTED NOT DETECTED Final   Shiga like toxin producing E coli (STEC) NOT DETECTED NOT DETECTED Final    Shigella/Enteroinvasive E coli (EIEC) NOT DETECTED NOT DETECTED Final   Cryptosporidium NOT DETECTED NOT DETECTED Final   Cyclospora cayetanensis NOT DETECTED NOT DETECTED Final   Entamoeba histolytica NOT DETECTED NOT DETECTED Final   Giardia lamblia NOT DETECTED NOT DETECTED Final   Adenovirus F40/41 NOT DETECTED NOT DETECTED Final   Astrovirus NOT DETECTED NOT DETECTED Final   Norovirus GI/GII NOT DETECTED NOT DETECTED Final   Rotavirus A NOT DETECTED NOT DETECTED Final   Sapovirus (I, II, IV, and V) NOT DETECTED NOT DETECTED Final    Comment: Performed at [facility], [address] [place],  [address]  C difficile quick scan w PCR reflex     Status: None   Collection Time: 01/20/19  2:04 PM   Specimen: STOOL  Result Value Ref Range Status   C Diff antigen NEGATIVE NEGATIVE Final   C Diff toxin NEGATIVE NEGATIVE Final   C Diff interpretation No C. difficile detected.  Final  Comment: Performed at [facility], [address] [place], [place] [address]  MRSA PCR Screening     Status: Abnormal   Collection Time: 01/20/19  4:48 PM   Specimen: Nasopharyngeal  Result Value Ref Range Status   MRSA by PCR POSITIVE (A) NEGATIVE Final    Comment:        The GeneXpert MRSA Assay (FDA approved for NASAL specimens only), is one component of a comprehensive MRSA colonization surveillance program. It is not intended to diagnose MRSA infection nor to guide or monitor treatment for MRSA infections. RESULT CALLED TO, READ BACK BY AND VERIFIED WITH: [facility] 01/20/19 @ 1857  [place] Performed at [facility], [address] [place], [place] [address]     Coagulation Studies: Recent Labs    01/20/19 0844  LABPROT 13.7  INR 1.1    Urinalysis: Recent Labs    01/20/19 2202  COLORURINE YELLOW*  LABSPEC 1.014  PHURINE 7.0  GLUCOSEU NEGATIVE  HGBUR LARGE*  BILIRUBINUR NEGATIVE  KETONESUR NEGATIVE  PROTEINUR 100*  NITRITE NEGATIVE   LEUKOCYTESUR LARGE*      Imaging: Dg Chest Port 1 View  Result Date: 01/21/2019 CLINICAL DATA:  Sepsis EXAM: Chest portable one view COMPARISON:  02/27/2016 FINDINGS: Heart is normal size. Interstitial prominence throughout the lungs, increased since prior study. Increased markings in the lung bases, likely atelectasis or scarring. No effusions or acute bony abnormality. IMPRESSION: Increasing interstitial prominence within the lungs, favor chronic interstitial lung disease. Bibasilar atelectasis or scarring. Electronically Signed   By: Rolm Baptise M.D.   On: 01/20/2019 15:46      Assessment & Plan: Mr. CORD WILCZYNSKI is a 63 y.o. white male with schizophrenia, hypertension, nephrolithiasis, COPD, atonic bladder, peptic ulcer disease, seizure disorder, CVA, coronary artery disease who is admitted to [facility] on 01/20/2019 for Acute kidney injury ([place]) [N17.9] Community acquired pneumonia, unspecified laterality [J18.9]  1. Acute renal failure with hyperkalemia and metabolic acidosis on chronic kidney disease stage III: baseline creatinine of 1.87, GFR of 37. Followed by [facility]phrology, Dr. Rockwell Germany.  Chronic kidney disease secondary to nephrolithiasis and obstructive uropathy.  Acute renal failure secondary to obstructive uropathy.  - Appreciate urology input.  - Stop IV fluids.   2. Hypertension: 139/72 - tamsulosin.   3. Sepsis: with pneumonia and acute exacerbation of COPD - supportive treatment. Azithromycin and Ceftriaxone empirically.   LOS: 1 Bekah Igoe 8/21/202011:43 AM

## 2019-01-21 NOTE — Transfer of Care (Signed)
Immediate Anesthesia Transfer of Care Note  Patient: Alan Small  Procedure(s) Performed: CYSTOSCOPY WITH RETROGRADE PYELOGRAM/URETERAL STENT PLACEMENT (Left Ureter)  Patient Location: PACU  Anesthesia Type:General  Level of Consciousness: awake and oriented  Airway & Oxygen Therapy: Patient connected to face mask oxygen  Post-op Assessment: Report given to RN  Post vital signs: Reviewed and stable  Last Vitals:  Vitals Value Taken Time  BP 140/69 01/21/19 1809  Temp    Pulse 125 01/21/19 1809  Resp 28 01/21/19 1809  SpO2 96 % 01/21/19 1809  Vitals shown include unvalidated device data.  Last Pain:  Vitals:   01/21/19 1500  TempSrc: Oral  PainSc:          Complications: No apparent anesthesia complications

## 2019-01-21 NOTE — Plan of Care (Signed)
  Problem: Education: Goal: Knowledge of General Education information will improve Description: Including pain rating scale, medication(s)/side effects and non-pharmacologic comfort measures Outcome: Progressing   Problem: Health Behavior/Discharge Planning: Goal: Ability to manage health-related needs will improve Outcome: Progressing   Problem: Education: Goal: Knowledge of General Education information will improve Description: Including pain rating scale, medication(s)/side effects and non-pharmacologic comfort measures Outcome: Progressing   Problem: Health Behavior/Discharge Planning: Goal: Ability to manage health-related needs will improve Outcome: Progressing   Problem: Clinical Measurements: Goal: Ability to maintain clinical measurements within normal limits will improve Outcome: Progressing Goal: Will remain free from infection Outcome: Progressing Goal: Diagnostic test results will improve Outcome: Progressing Goal: Respiratory complications will improve Outcome: Progressing Goal: Cardiovascular complication will be avoided Outcome: Progressing

## 2019-01-21 NOTE — Progress Notes (Signed)
[facility] NAME: Alan Small    MR#:  [id number]  DATE OF BIRTH:  [birth year]  SUBJECTIVE:  CHIEF COMPLAINT:   Chief Complaint  Patient presents with  . Chills  . Fever  . Emesis   Came with sepsis symptoms.  No more fever overnight.  His white blood cell count is still high.  He complained of abdominal pain on left side today. Renal function is significantly worse.  REVIEW OF SYSTEMS:   CONSTITUTIONAL: have fever, fatigue or weakness.  EYES: No blurred or double vision.  EARS, NOSE, AND THROAT: No tinnitus or ear pain.  RESPIRATORY: No cough, shortness of breath, wheezing or hemoptysis.  CARDIOVASCULAR: No chest pain, orthopnea, edema.  GASTROINTESTINAL: have nausea, vomiting, diarrhea ,have abdominal pain.  GENITOURINARY: No dysuria, hematuria.  ENDOCRINE: No polyuria, nocturia,  HEMATOLOGY: No anemia, easy bruising or bleeding SKIN: No rash or lesion. MUSCULOSKELETAL: No joint pain or arthritis.   NEUROLOGIC: No tingling, numbness, weakness.  PSYCHIATRY: No anxiety or depression.  ROS  DRUG ALLERGIES:   Allergies  Allergen Reactions  . Mellaril [Thioridazine] Shortness Of Breath and Swelling  . Buprenorphine Itching  . Morphine And Related Itching  . Navane [Thiothixene] Other (See Comments)    Reaction:  GI upset   . Thorazine [Chlorpromazine] Other (See Comments)    Reaction:  Dizziness/fainting     VITALS:  Blood pressure 139/72, pulse (!) 105, temperature 99.8 F (37.7 C), temperature source Oral, resp. rate 16, height 6\' 4"  (1.93 m), weight 74.8 kg, SpO2 96 %.  PHYSICAL EXAMINATION:  GENERAL:  63 y.o.-year-old patient lying in the bed with no acute distress.  EYES: Pupils equal, round, reactive to light and accommodation. No scleral icterus. Extraocular muscles intact.  HEENT: Head atraumatic, normocephalic. Oropharynx and nasopharynx clear.  NECK:  Supple, no jugular venous distention. No thyroid  enlargement, no tenderness.  LUNGS: Normal breath sounds bilaterally, no wheezing, rales,rhonchi or crepitation. No use of accessory muscles of respiration.  CARDIOVASCULAR: S1, S2 normal. No murmurs, rubs, or gallops.  ABDOMEN: Soft, left side anterior and flank area tender, nondistended. Bowel sounds present. No organomegaly or mass.  EXTREMITIES: No pedal edema, cyanosis, or clubbing.  NEUROLOGIC: Cranial nerves II through XII are intact. Muscle strength 5/5 in all extremities. Sensation intact. Gait not checked.  PSYCHIATRIC: The patient is alert and oriented x 3.  SKIN: No obvious rash, lesion, or ulcer.   Physical Exam LABORATORY PANEL:   CBC Recent Labs  Lab 01/21/19 0544  WBC 17.0*  HGB 13.6  HCT 42.8  PLT 132*   ------------------------------------------------------------------------------------------------------------------  Chemistries  Recent Labs  Lab 01/20/19 0844 01/21/19 0544  NA 138 134*  K 4.4 5.2*  CL 110 112*  CO2 19* 15*  GLUCOSE 108* 106*  BUN 41* 54*  CREATININE 2.78* 5.06*  CALCIUM 9.4 8.5*  MG 1.8  --   AST 26  --   ALT 18  --   ALKPHOS 105  --   BILITOT 0.9  --    ------------------------------------------------------------------------------------------------------------------  Cardiac Enzymes No results for input(s): TROPONINI in the last 168 hours. ------------------------------------------------------------------------------------------------------------------  RADIOLOGY:  US Renal  Result Date: 01/21/2019 CLINICAL DATA:  Acute renal failure. EXAM: RENAL / URINARY TRACT ULTRASOUND COMPLETE COMPARISON:  None. FINDINGS: Right Kidney: The patient is status post right nephrectomy. Left Kidney: Renal measurements: 11.0 x 5.6 x 6.1 cm = volume: 200 mL. Two large stones are seen in the left kidney with the  largest measuring 4.1 cm. Mild hydronephrosis is identified, age indeterminate. Bladder: Appears normal for degree of bladder distention.  IMPRESSION: 1. Two large stones are seen in the left kidney with the largest measuring 4.1 cm. There is mild hydronephrosis on the left was is age indeterminate. Recommend clinical correlation. Electronically Signed   By: Dorise Bullion III M.D   On: 01/21/2019 13:31   Dg Chest Port 1 View  Result Date: 01/21/2019 CLINICAL DATA:  Sepsis EXAM: Chest portable one view COMPARISON:  02/27/2016 FINDINGS: Heart is normal size. Interstitial prominence throughout the lungs, increased since prior study. Increased markings in the lung bases, likely atelectasis or scarring. No effusions or acute bony abnormality. IMPRESSION: Increasing interstitial prominence within the lungs, favor chronic interstitial lung disease. Bibasilar atelectasis or scarring. Electronically Signed   By: Rolm Baptise M.D.   On: 01/20/2019 15:46    ASSESSMENT AND PLAN:   Active Problems:   Sepsis ([place])   Community acquired pneumonia   Gastroenteritis  *Sepsis Due to pneumonia, UA positive. He also have complaints of diarrhea and abdominal pain- Stool studies negative. C diff negative.  We will start on antibiotics, IV fluids. Blood culture is sent- negative so far.  * Ac on ch renal failure stage 3, metabolic acidosis, obstructive uropathy Significant worsening.  Ultrasound renal shows some hydronephrosis, called nephrology and urology consult.  Most likely would need a procedure to decompress the obstruction.  *Community-acquired pneumonia IV antibiotics and fluids as mentioned above.  *Diarrhea negative for GI panel and C. Difficile. Diarrhea resolved.   *COPD Continue home inhalers, no exacerbation symptoms.  *Hypothyroid  Continue levothyroxine.  *Hyperkalemia Given 1 dose of Veltassa  All the records are reviewed and case discussed with Care Management/Social Workerr. Management plans discussed with the patient, family and they are in agreement.  CODE STATUS: full.  TOTAL TIME TAKING CARE OF THIS  PATIENT:35* minutes.     POSSIBLE D/C IN 2-3* DAYS, DEPENDING ON CLINICAL CONDITION.   Vaughan Basta M.D on 01/21/2019   Between 7am to 6pm - Pager - [phone]  After 6pm go to [email or web address] - password [email or web address] [facility]  Office  [phone]  CC: Primary care physician; [name], MD  Note: This dictation was prepared with Dragon dictation along with smaller phrase technology. Any transcriptional errors that result from this process are unintentional.

## 2019-01-21 NOTE — Progress Notes (Signed)
   01/21/19 1000  Clinical Encounter Type  Visited With Patient;Health care provider  Visit Type Initial  Referral From [place] text  Stress Factors  Patient Stress Factors Health changes   Chaplain received a referral from the on-call chaplain to provide a Bible for the patient. Upon arrival, the patient was sitting up in bed with the lights and television on. He was awake and alert and smiled when this writer acknowledged that "Let's Make a Deal" was on, which he said he was enjoying. The patient reported that he was "sore all over", but feeling "a bit better" than yesterday. This chaplain offered the patient the requested Bible; the patient confirmed that he had no additional needs at this time before his phone rang. Future follow-up would be welcome.

## 2019-01-21 NOTE — Op Note (Signed)
Preoperative diagnosis:  1. Left nephrolithiasis 2. Obstructing left renal pelvic calculus 3. Acute renal failure 4. Solitary kidney  Postoperative diagnosis:  1. Same 2. Anterior urethral stricture  Procedure:  1. Cystoscopy 2. Dilation anterior urethral stricture 3. Left ureteral stent placement (6FR) 24 cm 4. Left retrograde pyelography with interpretation   Surgeon: Nicki Reaper C. Onix Jumper, M.D.  Anesthesia: General  Complications: None  Intraoperative findings:  1.  Retrograde pyelogram-mild to moderate left hydronephrosis secondary to a left renal pelvic calculus  EBL: Minimal  Specimens: None  Indication: Alan Small is a 63 y.o. patient with a long history of recurrent stone disease.  He has had a previous right nephrectomy.  A CT scan performed March 2020 showed an ~10 mm left renal pelvic calculus without hydronephrosis.  There were also left partial staghorn/intraparenchymal calcifications.  He was admitted yesterday with pneumonia and urinalysis showing pyuria/microhematuria.  Creatinine was 2.78 up from a baseline creatinine of approximately 1.4.  Creatinine today had increased to 5.06.  Renal ultrasound shows mild left hydronephrosis.  After reviewing the management options for treatment, he elected to proceed with the above surgical procedure(s). We have discussed the potential benefits and risks of the procedure, side effects of the proposed treatment, the likelihood of the patient achieving the goals of the procedure, and any potential problems that might occur during the procedure or recuperation. Informed consent has been obtained.  Description of procedure:  The patient was taken to the operating room and general anesthesia was induced.  The patient was placed in the dorsal lithotomy position, prepped and draped in the usual sterile fashion, and preoperative antibiotics were administered. A preoperative time-out was performed.   A 21 French cystoscope was  lubricated and passed per urethra.  In the proximal portion of the anterior urethra and approximately 14 French stricture was identified.  Unable to negotiate the cystoscope proximal to the stricture and a 0.038 sensor wire was advanced through the stricture without difficulty.  The cystoscope was removed and the urethra was dilated from 14-22 [place].  The cystoscope was then advanced without difficulty.  Prostate remarkable for moderate lateral lobe enlargement with scattered calcifications.  The bladder mucosa was closely inspected and demonstrated no erythema, solid or papillary lesions.  The ureteral orifices were normal appearing bilaterally without identifiable efflux.  A 5 French open-ended ureteral catheter was advanced through the cystoscope and positioned at the left ureteral orifice.  The sensor wire was placed through the catheter and into the left ureter.  The ureteral catheter was advanced to the distal ureter and the guidewire was removed.  Retrograde pyelogram was performed with findings as described above.    The guidewire was replaced and the ureteral catheter was removed.  A 6 French/24 cm Contour ureteral stent was placed over the guidewire without difficulty.  Adequate positioning was noted proximally under fluoroscopy and distally under direct vision.  The bladder was emptied and cystoscope was removed.  After anesthetic reversal patient was transported to the PACU in stable condition.  Plan: -Follow creatinine -He is followed by [facility] and will need follow-up appointment there for definitive stone management after discharge   Alan Giovanni, MD

## 2019-01-21 NOTE — Anesthesia Postprocedure Evaluation (Signed)
Anesthesia Post Note  Patient: Alan Small  Procedure(s) Performed: CYSTOSCOPY WITH RETROGRADE PYELOGRAM/URETERAL STENT PLACEMENT (Left Ureter)  Patient location during evaluation: PACU Anesthesia Type: General Level of consciousness: awake and alert Pain management: pain level controlled Vital Signs Assessment: post-procedure vital signs reviewed and stable Respiratory status: spontaneous breathing, nonlabored ventilation, respiratory function stable and patient connected to nasal cannula oxygen Cardiovascular status: blood pressure returned to baseline and stable Postop Assessment: no apparent nausea or vomiting Anesthetic complications: no     Last Vitals:  Vitals:   01/21/19 1954 01/21/19 2003  BP: 117/67   Pulse: (!) 102   Resp: 17   Temp: 37.4 C 36.9 C  SpO2: 93%     Last Pain:  Vitals:   01/21/19 2005  TempSrc:   PainSc: 0-No pain                 Martha Clan

## 2019-01-22 ENCOUNTER — Inpatient Hospital Stay: Payer: Medicare Other

## 2019-01-22 DIAGNOSIS — N1330 Unspecified hydronephrosis: Secondary | ICD-10-CM

## 2019-01-22 DIAGNOSIS — N201 Calculus of ureter: Secondary | ICD-10-CM

## 2019-01-22 LAB — BASIC METABOLIC PANEL
Anion gap: 11 (ref 5–15)
BUN: 61 mg/dL — ABNORMAL HIGH (ref 8–23)
CO2: 17 mmol/L — ABNORMAL LOW (ref 22–32)
Calcium: 9 mg/dL (ref 8.9–10.3)
Chloride: 108 mmol/L (ref 98–111)
Creatinine, Ser: 5.01 mg/dL — ABNORMAL HIGH (ref 0.61–1.24)
GFR calc Af Amer: 13 mL/min — ABNORMAL LOW (ref >60)
GFR calc non Af Amer: 11 mL/min — ABNORMAL LOW (ref >60)
Glucose, Bld: 117 mg/dL — ABNORMAL HIGH (ref 70–99)
Potassium: 4.4 mmol/L (ref 3.5–5.1)
Sodium: 136 mmol/L (ref 135–145)

## 2019-01-22 LAB — CBC
HCT: 39.4 % (ref 39.0–52.0)
Hemoglobin: 12.6 g/dL — ABNORMAL LOW (ref 13.0–17.0)
MCH: 31 pg (ref 26.0–34.0)
MCHC: 32 g/dL (ref 30.0–36.0)
MCV: 96.8 fL (ref 80.0–100.0)
Platelets: 131 10*3/uL — ABNORMAL LOW (ref 150–400)
RBC: 4.07 MIL/uL — ABNORMAL LOW (ref 4.22–5.81)
RDW: 13.8 % (ref 11.5–15.5)
WBC: 13.6 10*3/uL — ABNORMAL HIGH (ref 4.0–10.5)
nRBC: 0 % (ref 0.0–0.2)

## 2019-01-22 LAB — URINE CULTURE

## 2019-01-22 LAB — PROCALCITONIN: Procalcitonin: 81.2 ng/mL

## 2019-01-22 MED ORDER — LORAZEPAM 2 MG/ML IJ SOLN
INTRAMUSCULAR | Status: AC
  Filled 2019-01-22: qty 1

## 2019-01-22 NOTE — Progress Notes (Signed)
[facility]  ROUNDING NOTE   Subjective:   Cystoscopy and left ureteral stent placed by Dr. Bernardo Heater yesterday.   Patient states he is feeling much better.   Objective:  Vital signs in last 24 hours:  Temp:  [97.9 F (36.6 C)-99.8 F (37.7 C)] 97.9 F (36.6 C) (08/22 1051) Pulse Rate:  [93-129] 97 (08/22 1051) Resp:  [14-28] 14 (08/22 1051) BP: (109-140)/(64-85) 135/85 (08/22 1051) SpO2:  [92 %-100 %] 95 % (08/22 1051)  Weight change:  Filed Weights   01/20/19 0828  Weight: 74.8 kg    Intake/Output: I/O last 3 completed shifts: In: 1460.2 [I.V.:1300.9; IV Piggyback:159.2] Out: 3570 [Urine:1315]   Intake/Output this shift:  Total I/O In: 100 [IV Piggyback:100] Out: 300 [Urine:300]  Physical Exam: General: NAD,   Head: Normocephalic, atraumatic. Moist oral mucosal membranes  Eyes: Anicteric, PERRL  Neck: Supple, trachea midline  Lungs:  Clear to auscultation  Heart: Regular rate and rhythm  Abdomen:  Soft, nontender,   Extremities:  no peripheral edema.  Neurologic: Nonfocal, moving all four extremities  Skin: No lesions  Access:      Basic Metabolic Panel: Recent Labs  Lab 01/20/19 0844 01/21/19 0544 01/22/19 0426  NA 138 134* 136  K 4.4 5.2* 4.4  CL 110 112* 108  CO2 19* 15* 17*  GLUCOSE 108* 106* 117*  BUN 41* 54* 61*  CREATININE 2.78* 5.06* 5.01*  CALCIUM 9.4 8.5* 9.0  MG 1.8  --   --     Liver Function Tests: Recent Labs  Lab 01/20/19 0844  AST 26  ALT 18  ALKPHOS 105  BILITOT 0.9  PROT 7.2  ALBUMIN 3.6   No results for input(s): LIPASE, AMYLASE in the last 168 hours. No results for input(s): AMMONIA in the last 168 hours.  CBC: Recent Labs  Lab 01/20/19 0844 01/21/19 0544 01/22/19 0426  WBC 14.6* 17.0* 13.6*  NEUTROABS 13.7*  --   --   HGB 14.4 13.6 12.6*  HCT 43.2 42.8 39.4  MCV 92.1 96.8 96.8  PLT 156 132* 131*    Cardiac Enzymes: No results for input(s): CKTOTAL, CKMB, CKMBINDEX, TROPONINI in the last  168 hours.  BNP: Invalid input(s): POCBNP  CBG: No results for input(s): GLUCAP in the last 168 hours.  Microbiology: Results for orders placed or performed during the hospital encounter of 01/20/19  SARS Coronavirus 2 [facility] order, Performed in [facility] hospital lab) Nasopharyngeal Nasopharyngeal Swab     Status: None   Collection Time: 01/20/19  8:44 AM   Specimen: Nasopharyngeal Swab  Result Value Ref Range Status   SARS Coronavirus 2 NEGATIVE NEGATIVE Final    Comment: (NOTE) If result is NEGATIVE SARS-CoV-2 target nucleic acids are NOT DETECTED. The SARS-CoV-2 RNA is generally detectable in upper and lower  respiratory specimens during the acute phase of infection. The lowest  concentration of SARS-CoV-2 viral copies this assay can detect is 250  copies / mL. A negative result does not preclude SARS-CoV-2 infection  and should not be used as the sole basis for treatment or other  patient management decisions.  A negative result may occur with  improper specimen collection / handling, submission of specimen other  than nasopharyngeal swab, presence of viral mutation(s) within the  areas targeted by this assay, and inadequate number of viral copies  (<250 copies / mL). A negative result must be combined with clinical  observations, patient history, and epidemiological information. If result is POSITIVE SARS-CoV-2 target nucleic acids are  DETECTED. The SARS-CoV-2 RNA is generally detectable in upper and lower  respiratory specimens dur ing the acute phase of infection.  Positive  results are indicative of active infection with SARS-CoV-2.  Clinical  correlation with patient history and other diagnostic information is  necessary to determine patient infection status.  Positive results do  not rule out bacterial infection or co-infection with other viruses. If result is PRESUMPTIVE POSTIVE SARS-CoV-2 nucleic acids MAY BE PRESENT.   A presumptive positive result was  obtained on the submitted specimen  and confirmed on repeat testing.  While 2019 novel coronavirus  (SARS-CoV-2) nucleic acids may be present in the submitted sample  additional confirmatory testing may be necessary for epidemiological  and / or clinical management purposes  to differentiate between  SARS-CoV-2 and other Sarbecovirus currently known to infect humans.  If clinically indicated additional testing with an alternate test  methodology [phone]) is advised. The SARS-CoV-2 RNA is generally  detectable in upper and lower respiratory sp ecimens during the acute  phase of infection. The expected result is Negative. Fact Sheet for Patients:  StrictlyIdeas.no Fact Sheet for Healthcare Providers: BankingDealers.co.za This test is not yet approved or cleared by the [place] FDA and has been authorized for detection and/or diagnosis of SARS-CoV-2 by FDA under an Emergency Use Authorization (EUA).  This EUA will remain in effect (meaning this test can be used) for the duration of the COVID-19 declaration under Section 564(b)(1) of the Act, 21 U.S.C. section 360bbb-3(b)(1), unless the authorization is terminated or revoked sooner. Performed at [facility], [address] [place], [place] [address]   Culture, blood (Routine x 2)     Status: None (Preliminary result)   Collection Time: 01/20/19  8:45 AM   Specimen: BLOOD  Result Value Ref Range Status   Specimen Description BLOOD LEFT AC  Final   Special Requests   Final    BOTTLES DRAWN AEROBIC AND ANAEROBIC Blood Culture results may not be optimal due to an excessive volume of blood received in culture bottles   Culture   Final    NO GROWTH < 24 HOURS Performed at [facility], [address] [place], [place] [address]    Report Status PENDING  Incomplete  Culture, blood (Routine x 2)     Status: None (Preliminary result)   Collection Time: 01/20/19  8:45  AM   Specimen: BLOOD  Result Value Ref Range Status   Specimen Description BLOOD RIGHT [facility]  Final   Special Requests   Final    BOTTLES DRAWN AEROBIC AND ANAEROBIC Blood Culture results may not be optimal due to an excessive volume of blood received in culture bottles   Culture   Final    NO GROWTH < 24 HOURS Performed at [facility], [address] [place],  [address]    Report Status PENDING  Incomplete  Gastrointestinal Panel by PCR , Stool     Status: None   Collection Time: 01/20/19  2:04 PM   Specimen: Stool  Result Value Ref Range Status   Campylobacter species NOT DETECTED NOT DETECTED Final   Plesimonas shigelloides NOT DETECTED NOT DETECTED Final   Salmonella species NOT DETECTED NOT DETECTED Final   Yersinia enterocolitica NOT DETECTED NOT DETECTED Final   Vibrio species NOT DETECTED NOT DETECTED Final   Vibrio cholerae NOT DETECTED NOT DETECTED Final   Enteroaggregative E coli (EAEC) NOT DETECTED NOT DETECTED Final   Enteropathogenic E coli (EPEC) NOT DETECTED NOT DETECTED  Final   Enterotoxigenic E coli (ETEC) NOT DETECTED NOT DETECTED Final   Shiga like toxin producing E coli (STEC) NOT DETECTED NOT DETECTED Final   Shigella/Enteroinvasive E coli (EIEC) NOT DETECTED NOT DETECTED Final   Cryptosporidium NOT DETECTED NOT DETECTED Final   Cyclospora cayetanensis NOT DETECTED NOT DETECTED Final   Entamoeba histolytica NOT DETECTED NOT DETECTED Final   Giardia lamblia NOT DETECTED NOT DETECTED Final   Adenovirus F40/41 NOT DETECTED NOT DETECTED Final   Astrovirus NOT DETECTED NOT DETECTED Final   Norovirus GI/GII NOT DETECTED NOT DETECTED Final   Rotavirus A NOT DETECTED NOT DETECTED Final   Sapovirus (I, II, IV, and V) NOT DETECTED NOT DETECTED Final    Comment: Performed at [facility], [address] [place], [place] [address]  C difficile quick scan w PCR reflex     Status: None   Collection Time: 01/20/19  2:04 PM   Specimen: STOOL   Result Value Ref Range Status   C Diff antigen NEGATIVE NEGATIVE Final   C Diff toxin NEGATIVE NEGATIVE Final   C Diff interpretation No C. difficile detected.  Final    Comment: Performed at [facility], [address] [place], [place] [address]  MRSA PCR Screening     Status: Abnormal   Collection Time: 01/20/19  4:48 PM   Specimen: Nasopharyngeal  Result Value Ref Range Status   MRSA by PCR POSITIVE (A) NEGATIVE Final    Comment:        The GeneXpert MRSA Assay (FDA approved for NASAL specimens only), is one component of a comprehensive MRSA colonization surveillance program. It is not intended to diagnose MRSA infection nor to guide or monitor treatment for MRSA infections. RESULT CALLED TO, READ BACK BY AND VERIFIED WITH: DEDRA [facility] 01/20/19 @ 1857  [place] Performed at [facility], [address] [place], [place] [address]   Urine Culture     Status: None   Collection Time: 01/20/19 10:02 PM   Specimen: Urine, Random  Result Value Ref Range Status   Specimen Description   Final    URINE, RANDOM Performed at [facility], [address], [place], [place] [address]    Special Requests   Final    NONE Performed at [facility], [address] [place], [place] [address]    Culture   Final    Multiple bacterial morphotypes present, none predominant. Suggest appropriate recollection if clinically indicated.   Report Status 01/22/2019 FINAL  Final    Coagulation Studies: Recent Labs    01/20/19 0844  LABPROT 13.7  INR 1.1    Urinalysis: Recent Labs    01/20/19 2202  COLORURINE YELLOW*  LABSPEC 1.014  PHURINE 7.0  GLUCOSEU NEGATIVE  HGBUR LARGE*  BILIRUBINUR NEGATIVE  KETONESUR NEGATIVE  PROTEINUR 100*  NITRITE NEGATIVE  LEUKOCYTESUR LARGE*      Imaging: Dg Chest 2 View  Result Date: 01/22/2019 CLINICAL DATA:  Encounter for evaluation of PNA. Hx stroke, MI, GERD, COPD, asthma. Current smoker. EXAM: CHEST -  2 VIEW COMPARISON:  01/20/2019 FINDINGS: Heart size is normal. There has been development of infiltrate in the LEFT LOWER lobe, obscuring the LEFT hemidiaphragm. Small LEFT pleural effusion is now present. Focal opacities are identified in the RIGHT middle lobe and RIGHT UPPER lobe. Small RIGHT pleural effusion is new since prior study. Mildly prominent interstitial markings are identified throughout the lungs, increased. IMPRESSION: 1. Interval development of bilateral infiltrates, LEFT greater than RIGHT. 2.  New small bilateral pleural effusions. Electronically Signed   By: Nolon Nations M.D.   On: 01/22/2019 09:53   US Renal  Result Date: 01/21/2019 CLINICAL DATA:  Acute renal failure. EXAM: RENAL / URINARY TRACT ULTRASOUND COMPLETE COMPARISON:  None. FINDINGS: Right Kidney: The patient is status post right nephrectomy. Left Kidney: Renal measurements: 11.0 x 5.6 x 6.1 cm = volume: 200 mL. Two large stones are seen in the left kidney with the largest measuring 4.1 cm. Mild hydronephrosis is identified, age indeterminate. Bladder: Appears normal for degree of bladder distention. IMPRESSION: 1. Two large stones are seen in the left kidney with the largest measuring 4.1 cm. There is mild hydronephrosis on the left was is age indeterminate. Recommend clinical correlation. Electronically Signed   By: Dorise Bullion III M.D   On: 01/21/2019 13:31     Medications:   . azithromycin 250 mg (01/22/19 1041)  . cefTRIAXone (ROCEPHIN)  IV 1 g (01/22/19 0931)   . Chlorhexidine Gluconate Cloth  6 each Topical Q0600  . FLUoxetine  60 mg Oral BH-q7a  . heparin  5,000 Units Subcutaneous Q8H  . levothyroxine  50 mcg Oral QAC breakfast  . LORazepam      . mometasone-formoterol  2 puff Inhalation BID  . mupirocin ointment  1 application Nasal BID  . paliperidone  156 mg Intramuscular Q30 days  . patiromer  8.4 g Oral Daily  . tamsulosin  0.4 mg Oral BH-q7a   acetaminophen, docusate sodium, hydrocortisone,  ipratropium-albuterol, oxyCODONE-acetaminophen  Assessment/ Plan:  [name] is a 62 y.o. white male with schizophrenia, hypertension, nephrolithiasis, COPD, atonic bladder, peptic ulcer disease, seizure disorder, CVA, coronary artery disease who is admitted to [facility] on 01/20/2019 for pneumonia and acute renal failure. Underwent cystoscopy and left stent placement by Dr. Bernardo Heater on 8/21.   1. Acute renal failure with hyperkalemia, hyponatremia and metabolic acidosis on chronic kidney disease stage III: baseline creatinine of 1.87, GFR of 37. Followed by [facility], Dr. Rockwell Germany.  Chronic kidney disease secondary to nephrolithiasis and obstructive uropathy.  Acute renal failure secondary to obstructive uropathy.  - Appreciate urology input.  - Encourage PO intake  2. Hypertension:   - tamsulosin.   3. Sepsis: with pneumonia and acute exacerbation of COPD - supportive treatment. Azithromycin and Ceftriaxone empirically.    LOS: 2 Kenedee Molesky 8/22/202011:34 AM

## 2019-01-22 NOTE — Progress Notes (Signed)
Urology Consult Follow Up  Subjective: No complaints this morning.  States he feels much better.  VSS, afebrile  Anti-infectives: Anti-infectives (From admission, onward)   Start     Dose/Rate Route Frequency Ordered Stop   01/21/19 1000  cefTRIAXone (ROCEPHIN) 1 g in sodium chloride 0.9 % 100 mL IVPB     1 g 200 mL/hr over 30 Minutes Intravenous Every 24 hours 01/20/19 1046     01/21/19 1000  azithromycin (ZITHROMAX) 250 mg in dextrose 5 % 125 mL IVPB     250 mg 125 mL/hr over 60 Minutes Intravenous Every 24 hours 01/20/19 1046     01/20/19 1015  cefTRIAXone (ROCEPHIN) 2 g in sodium chloride 0.9 % 100 mL IVPB  Status:  Discontinued     2 g 200 mL/hr over 30 Minutes Intravenous Every 24 hours 01/20/19 1011 01/20/19 1047   01/20/19 1015  azithromycin (ZITHROMAX) 500 mg in sodium chloride 0.9 % 250 mL IVPB  Status:  Discontinued     500 mg 250 mL/hr over 60 Minutes Intravenous Every 24 hours 01/20/19 1011 01/20/19 1048      Current Facility-Administered Medications  Medication Dose Route Frequency Provider Last Rate Last Dose  . acetaminophen (TYLENOL) tablet 1,000 mg  1,000 mg Oral BID PRN Abbie Sons, MD   1,000 mg at 01/22/19 0606  . azithromycin (ZITHROMAX) 250 mg in dextrose 5 % 125 mL IVPB  250 mg Intravenous Q24H Bailey Faiella C, MD 125 mL/hr at 01/22/19 1041 250 mg at 01/22/19 1041  . cefTRIAXone (ROCEPHIN) 1 g in sodium chloride 0.9 % 100 mL IVPB  1 g Intravenous Q24H Kemora Pinard C, MD 200 mL/hr at 01/22/19 0931 1 g at 01/22/19 0931  . Chlorhexidine Gluconate Cloth 2 % PADS 6 each  6 each Topical Q0600 Abbie Sons, MD   6 each at 01/22/19 0601  . docusate sodium (COLACE) capsule 100 mg  100 mg Oral BID PRN Ryatt Corsino C, MD      . FLUoxetine (PROZAC) capsule 60 mg  60 mg Oral BH-q7a Naquita Nappier C, MD   60 mg at 01/22/19 0600  . heparin injection 5,000 Units  5,000 Units Subcutaneous Q8H Safiatou Islam C, MD   5,000 Units at 01/22/19 0600  . hydrocortisone  (ANUSOL-HC) suppository 25 mg  25 mg Rectal BID PRN Falecia Vannatter C, MD      . ipratropium-albuterol (DUONEB) 0.5-2.5 (3) MG/3ML nebulizer solution 3 mL  3 mL Inhalation Q6H PRN Clotilda Hafer C, MD      . levothyroxine (SYNTHROID) tablet 50 mcg  50 mcg Oral QAC breakfast Saniya Tranchina C, MD   50 mcg at 01/22/19 0600  . LORazepam (ATIVAN) 2 MG/ML injection           . mometasone-formoterol (DULERA) 200-5 MCG/ACT inhaler 2 puff  2 puff Inhalation BID Abbie Sons, MD   2 puff at 01/22/19 1042  . mupirocin ointment (BACTROBAN) 2 % 1 application  1 application Nasal BID Abbie Sons, MD   1 application at 67/67/20 1042  . oxyCODONE-acetaminophen (PERCOCET/ROXICET) 5-325 MG per tablet 1 tablet  1 tablet Oral Q6H PRN Abbie Sons, MD   1 tablet at 01/21/19 1236  . paliperidone (INVEGA SUSTENNA) injection 156 mg  156 mg Intramuscular Q30 days Elaysia Devargas, Ronda Fairly, MD      . patiromer (VELTASSA) packet 8.4 g  8.4 g Oral Daily Mahreen Schewe C, MD   8.4 g at 01/22/19 1309  . tamsulosin (  FLOMAX) capsule 0.4 mg  0.4 mg Oral Barney Drain, Briony Parveen C, MD   0.4 mg at 01/22/19 0600     Objective: Vital signs in last 24 hours: Temp:  [97.9 F (36.6 C)-99.8 F (37.7 C)] 97.9 F (36.6 C) (08/22 1051) Pulse Rate:  [93-129] 97 (08/22 1051) Resp:  [14-28] 14 (08/22 1051) BP: (109-140)/(64-85) 135/85 (08/22 1051) SpO2:  [92 %-100 %] 95 % (08/22 1051)  Intake/Output from previous day: 08/21 0701 - 08/22 0700 In: 1460.2 [I.V.:1300.9; IV Piggyback:159.2] Out: 1290 [Urine:1290] Intake/Output this shift: Total I/O In: 100 [IV Piggyback:100] Out: 300 [Urine:300]   Physical Exam: Alert, no acute distress  Lab Results:  Recent Labs    01/21/19 0544 01/22/19 0426  WBC 17.0* 13.6*  HGB 13.6 12.6*  HCT 42.8 39.4  PLT 132* 131*   BMET Recent Labs    01/21/19 0544 01/22/19 0426  NA 134* 136  K 5.2* 4.4  CL 112* 108  CO2 15* 17*  GLUCOSE 106* 117*  BUN 54* 61*  CREATININE 5.06* 5.01*   CALCIUM 8.5* 9.0     Assessment: Doing well status post left ureteral stent placement for ARF/renal pelvic calculus in a solitary kidney.  Only minimal improvement in creatinine today.  s/p Procedure(s): CYSTOSCOPY WITH RETROGRADE PYELOGRAM/URETERAL STENT PLACEMENT   Plan:  - Continue to follow creatinine  - Established patient of [facility] and will need follow-up there for definitive stone treatment    LOS: 2 days    Ronda Fairly [facility] 01/22/2019

## 2019-01-22 NOTE — Progress Notes (Signed)
Alan Small NAME: Alan Small    MR#:  [id number]  DATE OF BIRTH:  11-06-1955  SUBJECTIVE:  CHIEF COMPLAINT:   Chief Complaint  Patient presents with  . Chills  . Fever  . Emesis   Came with sepsis symptoms.  No more fever overnight.  His renal function was worse and noted to have obstructive uropathy on left kidney.  Ureteral stent was placed 01/21/19 Feels much better today.  Renal function is still not much improved.  REVIEW OF SYSTEMS:   CONSTITUTIONAL: have fever, fatigue or weakness.  EYES: No blurred or double vision.  EARS, NOSE, AND THROAT: No tinnitus or ear pain.  RESPIRATORY: No cough, shortness of breath, wheezing or hemoptysis.  CARDIOVASCULAR: No chest pain, orthopnea, edema.  GASTROINTESTINAL: have nausea, vomiting, diarrhea ,have abdominal pain.  GENITOURINARY: No dysuria, hematuria.  ENDOCRINE: No polyuria, nocturia,  HEMATOLOGY: No anemia, easy bruising or bleeding SKIN: No rash or lesion. MUSCULOSKELETAL: No joint pain or arthritis.   NEUROLOGIC: No tingling, numbness, weakness.  PSYCHIATRY: No anxiety or depression.  ROS  DRUG ALLERGIES:   Allergies  Allergen Reactions  . Mellaril [Thioridazine] Shortness Of Breath and Swelling  . Buprenorphine Itching  . Morphine And Related Itching  . Navane [Thiothixene] Other (See Comments)    Reaction:  GI upset   . Thorazine [Chlorpromazine] Other (See Comments)    Reaction:  Dizziness/fainting     VITALS:  Blood pressure 135/85, pulse 97, temperature 97.9 F (36.6 C), resp. rate 14, height 6\' 4"  (1.93 m), weight 74.8 kg, SpO2 95 %.  PHYSICAL EXAMINATION:  GENERAL:  63 y.o.-year-old patient lying in the bed with no acute distress.  EYES: Pupils equal, round, reactive to light and accommodation. No scleral icterus. Extraocular muscles intact.  HEENT: Head atraumatic, normocephalic. Oropharynx and nasopharynx clear.  NECK:  Supple, no jugular venous  distention. No thyroid enlargement, no tenderness.  LUNGS: Normal breath sounds bilaterally, no wheezing, rales,rhonchi or crepitation. No use of accessory muscles of respiration.  CARDIOVASCULAR: S1, S2 normal. No murmurs, rubs, or gallops.  ABDOMEN: Soft, left side anterior and flank area tender, nondistended. Bowel sounds present. No organomegaly or mass.  EXTREMITIES: No pedal edema, cyanosis, or clubbing.  NEUROLOGIC: Cranial nerves II through XII are intact. Muscle strength 5/5 in all extremities. Sensation intact. Gait not checked.  PSYCHIATRIC: The patient is alert and oriented x 3.  SKIN: No obvious rash, lesion, or ulcer.   Physical Exam LABORATORY PANEL:   CBC Recent Labs  Lab 01/22/19 0426  WBC 13.6*  HGB 12.6*  HCT 39.4  PLT 131*   ------------------------------------------------------------------------------------------------------------------  Chemistries  Recent Labs  Lab 01/20/19 0844  01/22/19 0426  NA 138   < > 136  K 4.4   < > 4.4  CL 110   < > 108  CO2 19*   < > 17*  GLUCOSE 108*   < > 117*  BUN 41*   < > 61*  CREATININE 2.78*   < > 5.01*  CALCIUM 9.4   < > 9.0  MG 1.8  --   --   AST 26  --   --   ALT 18  --   --   ALKPHOS 105  --   --   BILITOT 0.9  --   --    < > = values in this interval not displayed.   ------------------------------------------------------------------------------------------------------------------  Cardiac Enzymes No results for input(s): TROPONINI in the  last 168 hours. ------------------------------------------------------------------------------------------------------------------  RADIOLOGY:  Dg Chest 2 View  Result Date: 01/22/2019 CLINICAL DATA:  Encounter for evaluation of PNA. Hx stroke, MI, GERD, COPD, asthma. Current smoker. EXAM: CHEST - 2 VIEW COMPARISON:  01/20/2019 FINDINGS: Heart size is normal. There has been development of infiltrate in the LEFT LOWER lobe, obscuring the LEFT hemidiaphragm. Small LEFT  pleural effusion is now present. Focal opacities are identified in the RIGHT middle lobe and RIGHT UPPER lobe. Small RIGHT pleural effusion is new since prior study. Mildly prominent interstitial markings are identified throughout the lungs, increased. IMPRESSION: 1. Interval development of bilateral infiltrates, LEFT greater than RIGHT. 2. New small bilateral pleural effusions. Electronically Signed   By: Nolon Nations M.D.   On: 01/22/2019 09:53   US Renal  Result Date: 01/21/2019 CLINICAL DATA:  Acute renal failure. EXAM: RENAL / URINARY TRACT ULTRASOUND COMPLETE COMPARISON:  None. FINDINGS: Right Kidney: The patient is status post right nephrectomy. Left Kidney: Renal measurements: 11.0 x 5.6 x 6.1 cm = volume: 200 mL. Two large stones are seen in the left kidney with the largest measuring 4.1 cm. Mild hydronephrosis is identified, age indeterminate. Bladder: Appears normal for degree of bladder distention. IMPRESSION: 1. Two large stones are seen in the left kidney with the largest measuring 4.1 cm. There is mild hydronephrosis on the left was is age indeterminate. Recommend clinical correlation. Electronically Signed   By: Dorise Bullion III M.D   On: 01/21/2019 13:31    ASSESSMENT AND PLAN:   Active Problems:   Sepsis ([place])   Community acquired pneumonia   Gastroenteritis  *Sepsis Due to pneumonia, UTI  UA positive. He also have complaints of diarrhea and abdominal pain- Stool studies negative. C diff negative.  GI complaints resolved now.  on antibiotics, IV fluids. Blood culture is sent- negative so far.  * Ac on ch renal failure stage 3, metabolic acidosis, obstructive uropathy-left renal stone Significant worsening.  Ultrasound renal shows some hydronephrosis, called nephrology and urology consult.   Left ureteral stent was placed 01/21/19  *Community-acquired pneumonia IV antibiotics and fluids as mentioned above.  *Diarrhea negative for GI panel and C. Difficile.  Diarrhea resolved.   *COPD Continue home inhalers, no exacerbation symptoms.  *Hypothyroid  Continue levothyroxine.  *Hyperkalemia Given 1 dose of Veltassa, improved now.  All the records are reviewed and case discussed with Care Management/Social Workerr. Management plans discussed with the patient, family and they are in agreement.  CODE STATUS: full.  TOTAL TIME TAKING CARE OF THIS PATIENT:35 minutes.    POSSIBLE D/C IN 2-3* DAYS, DEPENDING ON CLINICAL CONDITION.   Vaughan Basta M.D on 01/22/2019   Between 7am to 6pm - Pager - [phone]  After 6pm go to [email or web address] - password [email or web address] [facility]  Office  [phone]  CC: Primary care physician; [name], MD  Note: This dictation was prepared with Dragon dictation along with smaller phrase technology. Any transcriptional errors that result from this process are unintentional.

## 2019-01-23 ENCOUNTER — Encounter: Payer: Self-pay | Admitting: Urology

## 2019-01-23 LAB — BASIC METABOLIC PANEL
Anion gap: 10 (ref 5–15)
BUN: 50 mg/dL — ABNORMAL HIGH (ref 8–23)
CO2: 15 mmol/L — ABNORMAL LOW (ref 22–32)
Calcium: 8.9 mg/dL (ref 8.9–10.3)
Chloride: 114 mmol/L — ABNORMAL HIGH (ref 98–111)
Creatinine, Ser: 2.9 mg/dL — ABNORMAL HIGH (ref 0.61–1.24)
GFR calc Af Amer: 25 mL/min — ABNORMAL LOW (ref >60)
GFR calc non Af Amer: 22 mL/min — ABNORMAL LOW (ref >60)
Glucose, Bld: 94 mg/dL (ref 70–99)
Potassium: 4.1 mmol/L (ref 3.5–5.1)
Sodium: 139 mmol/L (ref 135–145)

## 2019-01-23 LAB — CBC
HCT: 35.8 % — ABNORMAL LOW (ref 39.0–52.0)
Hemoglobin: 11.7 g/dL — ABNORMAL LOW (ref 13.0–17.0)
MCH: 30.3 pg (ref 26.0–34.0)
MCHC: 32.7 g/dL (ref 30.0–36.0)
MCV: 92.7 fL (ref 80.0–100.0)
Platelets: 151 10*3/uL (ref 150–400)
RBC: 3.86 MIL/uL — ABNORMAL LOW (ref 4.22–5.81)
RDW: 13.7 % (ref 11.5–15.5)
WBC: 11.6 10*3/uL — ABNORMAL HIGH (ref 4.0–10.5)
nRBC: 0 % (ref 0.0–0.2)

## 2019-01-23 LAB — PROCALCITONIN: Procalcitonin: 42.09 ng/mL

## 2019-01-23 MED ORDER — SODIUM BICARBONATE-DEXTROSE 150-5 MEQ/L-% IV SOLN
150.0000 meq | INTRAVENOUS | Status: DC
  Administered 2019-01-23: 12:00:00 150 meq via INTRAVENOUS
  Filled 2019-01-23 (×3): qty 1000

## 2019-01-23 MED ORDER — BUTALBITAL-APAP-CAFFEINE 50-325-40 MG PO TABS
1.0000 | ORAL_TABLET | Freq: Four times a day (QID) | ORAL | Status: DC | PRN
  Administered 2019-01-23 – 2019-01-24 (×3): 1 via ORAL
  Filled 2019-01-23 (×3): qty 1

## 2019-01-23 NOTE — Plan of Care (Signed)

## 2019-01-23 NOTE — Progress Notes (Signed)
[facility]  ROUNDING NOTE   Subjective:   UOP 2050  Creatinine 2.9 (5.01)  Patient has no complaints.   Objective:  Vital signs in last 24 hours:  Temp:  [97.9 F (36.6 C)-98.7 F (37.1 C)] 98 F (36.7 C) (08/23 0406) Pulse Rate:  [89-101] 89 (08/23 0406) Resp:  [15-16] 16 (08/23 0406) BP: (135-155)/(80-96) 135/90 (08/23 0406) SpO2:  [93 %-97 %] 93 % (08/23 0406)  Weight change:  Filed Weights   01/20/19 0828  Weight: 74.8 kg    Intake/Output: I/O last 3 completed shifts: In: 425 [I.V.:200; IV Piggyback:225] Out: 8786 [Urine:3275]   Intake/Output this shift:  Total I/O In: 99 [IV Piggyback:99] Out: 600 [Urine:600]  Physical Exam: General: NAD,   Head: Normocephalic, atraumatic. Moist oral mucosal membranes  Eyes: Anicteric, PERRL  Neck: Supple, trachea midline  Lungs:  Clear to auscultation  Heart: Regular rate and rhythm  Abdomen:  Soft, nontender,   Extremities:  no peripheral edema.  Neurologic: Nonfocal, moving all four extremities  Skin: No lesions        Basic Metabolic Panel: Recent Labs  Lab 01/20/19 0844 01/21/19 0544 01/22/19 0426 01/23/19 0646  NA 138 134* 136 139  K 4.4 5.2* 4.4 4.1  CL 110 112* 108 114*  CO2 19* 15* 17* 15*  GLUCOSE 108* 106* 117* 94  BUN 41* 54* 61* 50*  CREATININE 2.78* 5.06* 5.01* 2.90*  CALCIUM 9.4 8.5* 9.0 8.9  MG 1.8  --   --   --     Liver Function Tests: Recent Labs  Lab 01/20/19 0844  AST 26  ALT 18  ALKPHOS 105  BILITOT 0.9  PROT 7.2  ALBUMIN 3.6   No results for input(s): LIPASE, AMYLASE in the last 168 hours. No results for input(s): AMMONIA in the last 168 hours.  CBC: Recent Labs  Lab 01/20/19 0844 01/21/19 0544 01/22/19 0426 01/23/19 0646  WBC 14.6* 17.0* 13.6* 11.6*  NEUTROABS 13.7*  --   --   --   HGB 14.4 13.6 12.6* 11.7*  HCT 43.2 42.8 39.4 35.8*  MCV 92.1 96.8 96.8 92.7  PLT 156 132* 131* 151    Cardiac Enzymes: No results for input(s): CKTOTAL, CKMB,  CKMBINDEX, TROPONINI in the last 168 hours.  BNP: Invalid input(s): POCBNP  CBG: No results for input(s): GLUCAP in the last 168 hours.  Microbiology: Results for orders placed or performed during the hospital encounter of 01/20/19  SARS Coronavirus 2 [facility] order, Performed in [facility] hospital lab) Nasopharyngeal Nasopharyngeal Swab     Status: None   Collection Time: 01/20/19  8:44 AM   Specimen: Nasopharyngeal Swab  Result Value Ref Range Status   SARS Coronavirus 2 NEGATIVE NEGATIVE Final    Comment: (NOTE) If result is NEGATIVE SARS-CoV-2 target nucleic acids are NOT DETECTED. The SARS-CoV-2 RNA is generally detectable in upper and lower  respiratory specimens during the acute phase of infection. The lowest  concentration of SARS-CoV-2 viral copies this assay can detect is 250  copies / mL. A negative result does not preclude SARS-CoV-2 infection  and should not be used as the sole basis for treatment or other  patient management decisions.  A negative result may occur with  improper specimen collection / handling, submission of specimen other  than nasopharyngeal swab, presence of viral mutation(s) within the  areas targeted by this assay, and inadequate number of viral copies  (<250 copies / mL). A negative result must be combined with clinical  observations, patient history, and epidemiological information. If result is POSITIVE SARS-CoV-2 target nucleic acids are DETECTED. The SARS-CoV-2 RNA is generally detectable in upper and lower  respiratory specimens dur ing the acute phase of infection.  Positive  results are indicative of active infection with SARS-CoV-2.  Clinical  correlation with patient history and other diagnostic information is  necessary to determine patient infection status.  Positive results do  not rule out bacterial infection or co-infection with other viruses. If result is PRESUMPTIVE POSTIVE SARS-CoV-2 nucleic acids MAY BE PRESENT.   A  presumptive positive result was obtained on the submitted specimen  and confirmed on repeat testing.  While 2019 novel coronavirus  (SARS-CoV-2) nucleic acids may be present in the submitted sample  additional confirmatory testing may be necessary for epidemiological  and / or clinical management purposes  to differentiate between  SARS-CoV-2 and other Sarbecovirus currently known to infect humans.  If clinically indicated additional testing with an alternate test  methodology [phone]) is advised. The SARS-CoV-2 RNA is generally  detectable in upper and lower respiratory sp ecimens during the acute  phase of infection. The expected result is Negative. Fact Sheet for Patients:  StrictlyIdeas.no Fact Sheet for Healthcare Providers: BankingDealers.co.za This test is not yet approved or cleared by the [place] FDA and has been authorized for detection and/or diagnosis of SARS-CoV-2 by FDA under an Emergency Use Authorization (EUA).  This EUA will remain in effect (meaning this test can be used) for the duration of the COVID-19 declaration under Section 564(b)(1) of the Act, 21 U.S.C. section 360bbb-3(b)(1), unless the authorization is terminated or revoked sooner. Performed at [facility], [address] [place], [place] [address]   Culture, blood (Routine x 2)     Status: None (Preliminary result)   Collection Time: 01/20/19  8:45 AM   Specimen: BLOOD  Result Value Ref Range Status   Specimen Description BLOOD LEFT AC  Final   Special Requests   Final    BOTTLES DRAWN AEROBIC AND ANAEROBIC Blood Culture results may not be optimal due to an excessive volume of blood received in culture bottles   Culture   Final    NO GROWTH 3 DAYS Performed at [facility], [address], [place], [place] [address]    Report Status PENDING  Incomplete  Culture, blood (Routine x 2)     Status: None (Preliminary result)    Collection Time: 01/20/19  8:45 AM   Specimen: BLOOD  Result Value Ref Range Status   Specimen Description BLOOD RIGHT [facility]  Final   Special Requests   Final    BOTTLES DRAWN AEROBIC AND ANAEROBIC Blood Culture results may not be optimal due to an excessive volume of blood received in culture bottles   Culture   Final    NO GROWTH 3 DAYS Performed at [facility], [address] [place], [place] [address]    Report Status PENDING  Incomplete  Gastrointestinal Panel by PCR , Stool     Status: None   Collection Time: 01/20/19  2:04 PM   Specimen: Stool  Result Value Ref Range Status   Campylobacter species NOT DETECTED NOT DETECTED Final   Plesimonas shigelloides NOT DETECTED NOT DETECTED Final   Salmonella species NOT DETECTED NOT DETECTED Final   Yersinia enterocolitica NOT DETECTED NOT DETECTED Final   Vibrio species NOT DETECTED NOT DETECTED Final   Vibrio cholerae NOT DETECTED NOT DETECTED Final   Enteroaggregative E coli (EAEC) NOT DETECTED  NOT DETECTED Final   Enteropathogenic E coli (EPEC) NOT DETECTED NOT DETECTED Final   Enterotoxigenic E coli (ETEC) NOT DETECTED NOT DETECTED Final   Shiga like toxin producing E coli (STEC) NOT DETECTED NOT DETECTED Final   Shigella/Enteroinvasive E coli (EIEC) NOT DETECTED NOT DETECTED Final   Cryptosporidium NOT DETECTED NOT DETECTED Final   Cyclospora cayetanensis NOT DETECTED NOT DETECTED Final   Entamoeba histolytica NOT DETECTED NOT DETECTED Final   Giardia lamblia NOT DETECTED NOT DETECTED Final   Adenovirus F40/41 NOT DETECTED NOT DETECTED Final   Astrovirus NOT DETECTED NOT DETECTED Final   Norovirus GI/GII NOT DETECTED NOT DETECTED Final   Rotavirus A NOT DETECTED NOT DETECTED Final   Sapovirus (I, II, IV, and V) NOT DETECTED NOT DETECTED Final    Comment: Performed at [facility], [address] [place], [place] [address]  C difficile quick scan w PCR reflex     Status: None   Collection Time: 01/20/19   2:04 PM   Specimen: STOOL  Result Value Ref Range Status   C Diff antigen NEGATIVE NEGATIVE Final   C Diff toxin NEGATIVE NEGATIVE Final   C Diff interpretation No C. difficile detected.  Final    Comment: Performed at [facility], [address] [place], [place] [address]  MRSA PCR Screening     Status: Abnormal   Collection Time: 01/20/19  4:48 PM   Specimen: Nasopharyngeal  Result Value Ref Range Status   MRSA by PCR POSITIVE (A) NEGATIVE Final    Comment:        The GeneXpert MRSA Assay (FDA approved for NASAL specimens only), is one component of a comprehensive MRSA colonization surveillance program. It is not intended to diagnose MRSA infection nor to guide or monitor treatment for MRSA infections. RESULT CALLED TO, READ BACK BY AND VERIFIED WITH: DEDRA [facility] 01/20/19 @ 1857  [place] Performed at [facility], [address], [place], [place] [address]   Urine Culture     Status: None   Collection Time: 01/20/19 10:02 PM   Specimen: Urine, Random  Result Value Ref Range Status   Specimen Description   Final    URINE, RANDOM Performed at [facility], [address], [place], [place] [address]    Special Requests   Final    NONE Performed at [facility], [address] [place], [place] [address]    Culture   Final    Multiple bacterial morphotypes present, none predominant. Suggest appropriate recollection if clinically indicated.   Report Status 01/22/2019 FINAL  Final    Coagulation Studies: No results for input(s): LABPROT, INR in the last 72 hours.  Urinalysis: Recent Labs    01/20/19 2202  COLORURINE YELLOW*  LABSPEC 1.014  PHURINE 7.0  GLUCOSEU NEGATIVE  HGBUR LARGE*  BILIRUBINUR NEGATIVE  KETONESUR NEGATIVE  PROTEINUR 100*  NITRITE NEGATIVE  LEUKOCYTESUR LARGE*      Imaging: Dg Chest 2 View  Result Date: 01/22/2019 CLINICAL DATA:  Encounter for evaluation of PNA. Hx stroke, MI, GERD, COPD, asthma.  Current smoker. EXAM: CHEST - 2 VIEW COMPARISON:  01/20/2019 FINDINGS: Heart size is normal. There has been development of infiltrate in the LEFT LOWER lobe, obscuring the LEFT hemidiaphragm. Small LEFT pleural effusion is now present. Focal opacities are identified in the RIGHT middle lobe and RIGHT UPPER lobe. Small RIGHT pleural effusion is new since prior study. Mildly prominent interstitial markings are identified throughout the lungs, increased. IMPRESSION: 1. Interval  development of bilateral infiltrates, LEFT greater than RIGHT. 2. New small bilateral pleural effusions. Electronically Signed   By: Nolon Nations M.D.   On: 01/22/2019 09:53   US Renal  Result Date: 01/21/2019 CLINICAL DATA:  Acute renal failure. EXAM: RENAL / URINARY TRACT ULTRASOUND COMPLETE COMPARISON:  None. FINDINGS: Right Kidney: The patient is status post right nephrectomy. Left Kidney: Renal measurements: 11.0 x 5.6 x 6.1 cm = volume: 200 mL. Two large stones are seen in the left kidney with the largest measuring 4.1 cm. Mild hydronephrosis is identified, age indeterminate. Bladder: Appears normal for degree of bladder distention. IMPRESSION: 1. Two large stones are seen in the left kidney with the largest measuring 4.1 cm. There is mild hydronephrosis on the left was is age indeterminate. Recommend clinical correlation. Electronically Signed   By: Dorise Bullion III M.D   On: 01/21/2019 13:31     Medications:   . azithromycin 250 mg (01/23/19 0925)  . cefTRIAXone (ROCEPHIN)  IV Stopped (01/23/19 0935)  . sodium bicarbonate 150 mEq in dextrose 5% 1000 mL 150 mEq (01/23/19 1201)   . Chlorhexidine Gluconate Cloth  6 each Topical Q0600  . FLUoxetine  60 mg Oral BH-q7a  . heparin  5,000 Units Subcutaneous Q8H  . levothyroxine  50 mcg Oral QAC breakfast  . mometasone-formoterol  2 puff Inhalation BID  . mupirocin ointment  1 application Nasal BID  . paliperidone  156 mg Intramuscular Q30 days  . tamsulosin  0.4 mg  Oral BH-q7a   acetaminophen, butalbital-acetaminophen-caffeine, docusate sodium, hydrocortisone, ipratropium-albuterol, oxyCODONE-acetaminophen  Assessment/ Plan:  [name] is a 63 y.o. white male with schizophrenia, hypertension, nephrolithiasis, COPD, atonic bladder, peptic ulcer disease, seizure disorder, CVA, coronary artery disease who is admitted to [facility] on 01/20/2019 for pneumonia and acute renal failure. Underwent cystoscopy and left stent placement by Dr. Bernardo Heater on 8/21.   1. Acute renal failure with hyperkalemia, hyponatremia and metabolic acidosis on chronic kidney disease stage III: baseline creatinine of 1.87, GFR of 37. Followed by [facility], Dr. Rockwell Germany.  Chronic kidney disease secondary to nephrolithiasis and obstructive uropathy.  Acute renal failure secondary to obstructive uropathy.  - Appreciate urology input.  - IV fluids: sodium bicarb infusion at 64mL/hr  2. Hypertension:   - tamsulosin.   3. Sepsis: with pneumonia and acute exacerbation of COPD - supportive treatment. Azithromycin and Ceftriaxone empirically.    LOS: 3 Dawood Spitler 8/23/202012:22 PM

## 2019-01-23 NOTE — Evaluation (Signed)
Physical Therapy Evaluation Patient Details Name: SHALON COUNCILMAN MRN: [id number] DOB: Aug 13, 1955 Today's Date: 01/23/2019   History of Present Illness  VYNCENT OVERBY is a 63 y.o. patient with a long history of recurrent stone disease.  He has had a previous right nephrectomy.  A CT scan performed March 2020 showed an ~10 mm left renal pelvic calculus without hydronephrosis.  He was admitted with pneumonia and urinalysis showing pyuria/microhematuria. Pt underwent cystoscopy with urology on 8/21 revealing of retrograde pyelogram-mild to moderate left hydronephrosis secondary to a left renal pelvic calculus  Clinical Impression  Pt admitted with above diagnosis. Pt currently with functional limitations due to the deficits listed below (see "PT Problem List"). Upon entry, pt in bed, awake and agreeable to participate. The pt is alert and oriented x4, pleasant, conversational, and generally a detailed historian, although some details are fantastic with confabulation needing to be ruled out d/t history of paranoid schizophrenia. As a qualifier, he reports issues with "blackouts" [syncope] and falls over the past couple years which he attributes to exposure he had to a [place] "spy satelite" that he found. Pt given a RW for mobility as SPC not currently available. Pt moving well in general, no LOB, the only indicators of acute impairment being some subjective report of knee weakness and lightheadedness, both following AMB. Pt denies giddiness as a typical prodrome to his "blackouts" PTA. Pt denies need for PT at DC, despite a recurrent falling issue. He denies need for any additional DME. Functional mobility assessment demonstrates increased effort/time requirements, poor tolerance, and need for physical assistance, whereas the patient performed these at a higher level of independence PTA. Pt will benefit from skilled PT intervention to increase independence and safety with basic mobility in preparation for  discharge to the venue listed below.       Follow Up Recommendations Home health PT;Supervision for mobility/OOB;Other (comment)(pt would benefit, but he denies desire for this service.)    Equipment Recommendations  None recommended by PT;Other (comment)(pt feels his SPC will be sufficient)    Recommendations for Other Services       Precautions / Restrictions Precautions Precautions: None Restrictions Weight Bearing Restrictions: No      Mobility  Bed Mobility Overal bed mobility: Independent                Transfers Overall transfer level: Modified independent Equipment used: Rolling walker (2 wheeled)             General transfer comment: performed 6x from EOB, fluid movementy, no hesitation, good confidence, no LOB; Pt endorse feeling generally good.  Ambulation/Gait Ambulation/Gait assistance: Modified independent (Device/Increase time) Gait Distance (Feet): 75 Feet Assistive device: Rolling walker (2 wheeled) Gait Pattern/deviations: WFL(Within Functional Limits)     General Gait Details: Pryor Curia assists with IV, Pt manages RW, attends well to IIV line, multiple turns and AMB in room without instability of gait. Distance limited by isolation precautions and small room size with many obstacles.(some lightheadedness after AMB in room)  Stairs            Wheelchair Mobility    Modified Rankin (Stroke Patients Only)       Balance                                             Pertinent Vitals/Pain Pain Assessment: No/denies pain    Home  Living Family/patient expects to be discharged to:: Group home                 Additional Comments: 1 level home, with ramp access per EMR    Prior Function Level of Independence: Independent with assistive device(s)         Comments: SPC for balance deficits and pain control; reports 2-3 "blackouts" with falling in 3 months.     Hand Dominance   Dominant Hand: Right     Extremity/Trunk Assessment   Upper Extremity Assessment Upper Extremity Assessment: Overall WFL for tasks assessed    Lower Extremity Assessment Lower Extremity Assessment: Overall WFL for tasks assessed       Communication   Communication: No difficulties  Cognition Arousal/Alertness: Awake/alert Behavior During Therapy: WFL for tasks assessed/performed Overall Cognitive Status: Within Functional Limits for tasks assessed                                        General Comments      Exercises     Assessment/Plan    PT Assessment Patient needs continued PT services  PT Problem List Decreased activity tolerance;Decreased balance;Decreased mobility       PT Treatment Interventions DME instruction;Balance training;Gait training;Therapeutic activities;Functional mobility training;Therapeutic exercise    PT Goals (Current goals can be found in the Care Plan section)  Acute Rehab PT Goals Patient Stated Goal: return to home, regain strength for modI SPC AMB PT Goal Formulation: With patient Time For Goal Achievement: 02/06/19 Potential to Achieve Goals: Good    Frequency Min 2X/week   Barriers to discharge        Co-evaluation               AM-PAC PT "6 Clicks" Mobility  Outcome Measure Help needed turning from your back to your side while in a flat bed without using bedrails?: None Help needed moving from lying on your back to sitting on the side of a flat bed without using bedrails?: None Help needed moving to and from a bed to a chair (including a wheelchair)?: None   Help needed to walk in hospital room?: None Help needed climbing 3-5 steps with a railing? : A Little 6 Click Score: 19    End of Session   Activity Tolerance: Patient tolerated treatment well;No increased pain Patient left: in chair;with call bell/phone within reach Nurse Communication: Mobility status PT Visit Diagnosis: Unsteadiness on feet (R26.81);Muscle weakness  (generalized) (M62.81)    Time: 1655-3748 PT Time Calculation (min) (ACUTE ONLY): 24 min   Charges:   PT Evaluation $PT Eval Low Complexity: 1 Low         4:19 PM, 01/23/19 Etta Grandchild, PT, DPT Physical Therapist - [facility]  [phone] ([place])    Shanae Luo C 01/23/2019, 4:15 PM

## 2019-01-23 NOTE — Progress Notes (Signed)
[facility] NAME: Alan Small    MR#:  [id number]  DATE OF BIRTH:  April 18, 1956  SUBJECTIVE:  CHIEF COMPLAINT:   Chief Complaint  Patient presents with  . Chills  . Fever  . Emesis   Came with sepsis symptoms.  No more fever overnight.  His renal function was worse and noted to have obstructive uropathy on left kidney.  Ureteral stent was placed 01/21/19 Feels much better today.  Renal function is better.  REVIEW OF SYSTEMS:   CONSTITUTIONAL: have fever, fatigue or weakness.  EYES: No blurred or double vision.  EARS, NOSE, AND THROAT: No tinnitus or ear pain.  RESPIRATORY: No cough, shortness of breath, wheezing or hemoptysis.  CARDIOVASCULAR: No chest pain, orthopnea, edema.  GASTROINTESTINAL: have nausea, vomiting, diarrhea ,have abdominal pain.  GENITOURINARY: No dysuria, hematuria.  ENDOCRINE: No polyuria, nocturia,  HEMATOLOGY: No anemia, easy bruising or bleeding SKIN: No rash or lesion. MUSCULOSKELETAL: No joint pain or arthritis.   NEUROLOGIC: No tingling, numbness, weakness.  PSYCHIATRY: No anxiety or depression.  ROS  DRUG ALLERGIES:   Allergies  Allergen Reactions  . Mellaril [Thioridazine] Shortness Of Breath and Swelling  . Buprenorphine Itching  . Morphine And Related Itching  . Navane [Thiothixene] Other (See Comments)    Reaction:  GI upset   . Thorazine [Chlorpromazine] Other (See Comments)    Reaction:  Dizziness/fainting     VITALS:  Blood pressure 135/90, pulse 89, temperature 98 F (36.7 C), temperature source Oral, resp. rate 16, height 6\' 4"  (1.93 m), weight 74.8 kg, SpO2 93 %.  PHYSICAL EXAMINATION:  GENERAL:  63 y.o.-year-old patient lying in the bed with no acute distress.  EYES: Pupils equal, round, reactive to light and accommodation. No scleral icterus. Extraocular muscles intact.  HEENT: Head atraumatic, normocephalic. Oropharynx and nasopharynx clear.  NECK:  Supple, no jugular  venous distention. No thyroid enlargement, no tenderness.  LUNGS: Normal breath sounds bilaterally, no wheezing, rales,rhonchi or crepitation. No use of accessory muscles of respiration.  CARDIOVASCULAR: S1, S2 normal. No murmurs, rubs, or gallops.  ABDOMEN: Soft, nontender, nondistended. Bowel sounds present. No organomegaly or mass.  EXTREMITIES: No pedal edema, cyanosis, or clubbing.  NEUROLOGIC: Cranial nerves II through XII are intact. Muscle strength 5/5 in all extremities. Sensation intact. Gait not checked.  PSYCHIATRIC: The patient is alert and oriented x 3.  SKIN: No obvious rash, lesion, or ulcer.   Physical Exam LABORATORY PANEL:   CBC Recent Labs  Lab 01/23/19 0646  WBC 11.6*  HGB 11.7*  HCT 35.8*  PLT 151   ------------------------------------------------------------------------------------------------------------------  Chemistries  Recent Labs  Lab 01/20/19 0844  01/23/19 0646  NA 138   < > 139  K 4.4   < > 4.1  CL 110   < > 114*  CO2 19*   < > 15*  GLUCOSE 108*   < > 94  BUN 41*   < > 50*  CREATININE 2.78*   < > 2.90*  CALCIUM 9.4   < > 8.9  MG 1.8  --   --   AST 26  --   --   ALT 18  --   --   ALKPHOS 105  --   --   BILITOT 0.9  --   --    < > = values in this interval not displayed.   ------------------------------------------------------------------------------------------------------------------  Cardiac Enzymes No results for input(s): TROPONINI in the last 168 hours. ------------------------------------------------------------------------------------------------------------------  RADIOLOGY:  Dg Chest 2 View  Result Date: 01/22/2019 CLINICAL DATA:  Encounter for evaluation of PNA. Hx stroke, MI, GERD, COPD, asthma. Current smoker. EXAM: CHEST - 2 VIEW COMPARISON:  01/20/2019 FINDINGS: Heart size is normal. There has been development of infiltrate in the LEFT LOWER lobe, obscuring the LEFT hemidiaphragm. Small LEFT pleural effusion is now  present. Focal opacities are identified in the RIGHT middle lobe and RIGHT UPPER lobe. Small RIGHT pleural effusion is new since prior study. Mildly prominent interstitial markings are identified throughout the lungs, increased. IMPRESSION: 1. Interval development of bilateral infiltrates, LEFT greater than RIGHT. 2. New small bilateral pleural effusions. Electronically Signed   By: Nolon Nations M.D.   On: 01/22/2019 09:53    ASSESSMENT AND PLAN:   Active Problems:   Sepsis ([place])   Community acquired pneumonia   Gastroenteritis  *Sepsis Due to pneumonia, UTI  UA positive. He also have complaints of diarrhea and abdominal pain- Stool studies negative. C diff negative.  GI complaints resolved now.  on antibiotics, IV fluids. Blood culture is sent- negative so far.  * Ac on ch renal failure stage 3, metabolic acidosis, obstructive uropathy-left renal stone Significant worsening.  Ultrasound renal shows some hydronephrosis, called nephrology and urology consult.   Left ureteral stent was placed 01/21/19- renal function is improving now. Advised to follow-up with [facility] urology services in next 1 to 2 weeks.  *Community-acquired pneumonia IV antibiotics and fluids as mentioned above.  *Diarrhea negative for GI panel and C. Difficile. Diarrhea resolved.  *COPD Continue home inhalers, no exacerbation symptoms.  *Hypothyroid  Continue levothyroxine.  *Hyperkalemia Given 1 dose of Veltassa, improved now.  All the records are reviewed and case discussed with Care Management/Social Workerr. Management plans discussed with the patient, family and they are in agreement.  CODE STATUS: full.  TOTAL TIME TAKING CARE OF THIS PATIENT:35 minutes.    POSSIBLE D/C IN 1-2 DAYS, DEPENDING ON CLINICAL CONDITION.   Vaughan Basta M.D on 01/23/2019   Between 7am to 6pm - Pager - [phone]  After 6pm go to [email or web address] - password [email or web address] [facility]   Office  [phone]  CC: Primary care physician; [name], MD  Note: This dictation was prepared with Dragon dictation along with smaller phrase technology. Any transcriptional errors that result from this process are unintentional.

## 2019-01-24 LAB — CBC
HCT: 35.4 % — ABNORMAL LOW (ref 39.0–52.0)
Hemoglobin: 11.8 g/dL — ABNORMAL LOW (ref 13.0–17.0)
MCH: 30.4 pg (ref 26.0–34.0)
MCHC: 33.3 g/dL (ref 30.0–36.0)
MCV: 91.2 fL (ref 80.0–100.0)
Platelets: 169 10*3/uL (ref 150–400)
RBC: 3.88 MIL/uL — ABNORMAL LOW (ref 4.22–5.81)
RDW: 13.8 % (ref 11.5–15.5)
WBC: 10.9 10*3/uL — ABNORMAL HIGH (ref 4.0–10.5)
nRBC: 0 % (ref 0.0–0.2)

## 2019-01-24 LAB — PROCALCITONIN: Procalcitonin: 22.94 ng/mL

## 2019-01-24 LAB — BASIC METABOLIC PANEL
Anion gap: 11 (ref 5–15)
BUN: 36 mg/dL — ABNORMAL HIGH (ref 8–23)
CO2: 20 mmol/L — ABNORMAL LOW (ref 22–32)
Calcium: 8.9 mg/dL (ref 8.9–10.3)
Chloride: 109 mmol/L (ref 98–111)
Creatinine, Ser: 2.27 mg/dL — ABNORMAL HIGH (ref 0.61–1.24)
GFR calc Af Amer: 34 mL/min — ABNORMAL LOW (ref >60)
GFR calc non Af Amer: 29 mL/min — ABNORMAL LOW (ref >60)
Glucose, Bld: 101 mg/dL — ABNORMAL HIGH (ref 70–99)
Potassium: 3.6 mmol/L (ref 3.5–5.1)
Sodium: 140 mmol/L (ref 135–145)

## 2019-01-24 MED ORDER — CEFUROXIME AXETIL 250 MG PO TABS: 250.0000 mg | ORAL_TABLET | Freq: Two times a day (BID) | ORAL | 0 refills | Status: AC

## 2019-01-24 MED ORDER — OXYCODONE-ACETAMINOPHEN 5-325 MG PO TABS: 1.0000 | ORAL_TABLET | Freq: Three times a day (TID) | ORAL | 0 refills | Status: DC | PRN

## 2019-01-24 NOTE — TOC Transition Note (Signed)
Transition of Care [facility]) - CM/SW Discharge Note   Patient Details  Name: SHUAYB SCHEPERS MRN: [id number] Date of Birth: 1955/07/03  Transition of Care [facility]) CM/SW Contact:  Shelbie Hutching, RN Phone Number: 01/24/2019, 3:11 PM   Clinical Narrative:    Patient discharged back to [place].  [facility] provided transportation at discharge.  Home Health has been arranged with [facility] for PT.  [name] is the patient's legal guardian and has been notified of discharge and has been faxed a copy of the discharge summary.  FL2 and discharge summary faxed to Group home and copy sent in discharge packet with patient.     Final next level of care: Home w Home Health Services Barriers to Discharge: Barriers Resolved   Patient Goals and CMS Choice   CMS Medicare.gov Compare Post Acute Care list provided to:: Legal Guardian Choice offered to / list presented to : [place] / [place]  Discharge Placement                       Discharge Plan and Services   Discharge Planning Services: CM Consult                      HH Arranged: PT [facility] Agency: [place] ([place]) Date [place]: 01/24/19 Time [place]: 8616 Representative spoke with at [place]: [place] (SDOH) Interventions     Readmission Risk Interventions No flowsheet data found.

## 2019-01-24 NOTE — Discharge Instructions (Signed)
Check renl function in 3-4 days. Follow with [facility] in 1-2 weeks for ureteral stent.

## 2019-01-24 NOTE — Progress Notes (Signed)
Pt d/c to group home via worker. IVs removed intact. VSS. Education completed. All belongings sent with pt.

## 2019-01-24 NOTE — TOC Initial Note (Signed)
Transition of Care [facility]) - Initial/Assessment Note    Patient Details  Name: Alan Small MRN: [id number] Date of Birth: [birth year]  [facility]) CM/SW Contact:    Shelbie Hutching, RN Phone Number: 01/24/2019, 9:54 AM  Clinical Narrative:                 Patient admitted with community acquired pneumonia.  Patient is from a group home and has a legal guardian.  PT has recommended home health PT.  RNCM will reach out to legal guardian for choice of home health agency.    Expected Discharge Plan: Group Home Barriers to Discharge: Continued Medical Work up   Patient Goals and CMS Choice        Expected Discharge Plan and Services Expected Discharge Plan: Group Home   Discharge Planning Services: CM Consult   Living arrangements for the past 2 months: Group Home                                      Prior Living Arrangements/Services Living arrangements for the past 2 months: Group Home Lives with:: Facility Resident          Need for Family Participation in Patient Care: Yes (Comment) Care giver support system in place?: Yes (comment)(legal guardian- group home resident)      Activities of Daily Living Home Assistive Devices/Equipment: Cane (specify quad or straight) ADL Screening (condition at time of admission) Patient's cognitive ability adequate to safely complete daily activities?: Yes Is the patient deaf or have difficulty hearing?: No Does the patient have difficulty seeing, even when wearing glasses/contacts?: No Does the patient have difficulty concentrating, remembering, or making decisions?: No Patient able to express need for assistance with ADLs?: Yes Does the patient have difficulty dressing or bathing?: No Independently performs ADLs?: Yes (appropriate for developmental age) Does the patient have difficulty walking or climbing stairs?: No Weakness of Legs: None Weakness of Arms/Hands: None  Permission Sought/Granted                 Emotional Assessment       Orientation: : Oriented to Self, Oriented to Place, Oriented to  Time, Oriented to Situation Alcohol / Substance Use: Tobacco Use Psych Involvement: Outpatient Provider  Admission diagnosis:  Acute kidney injury ([place]) [N17.9] Community acquired pneumonia, unspecified laterality [J18.9] Patient Active Problem List   Diagnosis Date Noted  . Community acquired pneumonia 01/20/2019  . Gastroenteritis 01/20/2019  . Blood in stool   . Change in bowel habits   . Constipation   . Polyp of sigmoid colon   . Depression 02/26/2016  . COPD (chronic obstructive pulmonary disease) ([place]) 02/03/2016  . Suprapubic catheter ([place]) 12/17/2015  . Urethral stricture 12/17/2015  . Urinary retention 10/27/2015  . Tobacco use disorder 08/27/2015  . Malnutrition of moderate degree 05/09/2015  . Sepsis ([place]) 05/06/2015  . UTI (lower urinary tract infection) 01/17/2015  . Hypoglycemia 01/17/2015  . Gastroesophageal reflux disease 10/28/2014  . Bright red rectal bleeding 10/26/2014  . Chronic constipation 10/26/2014  . Hydronephrosis of left kidney 01/17/2014  . Neurogenic bladder 06/21/2013  . Urolithiasis 03/15/2013  . Nephrolithiasis 03/04/2013  . Metabolic acidosis 78/29/5621  . Acute renal failure ([place]) 11/03/2012  . Hyperkalemia 11/03/2012  . Pyelonephritis 11/03/2012  . Schizophrenia ([place]) 11/03/2012  . Hypothyroidism 11/03/2012  . Severe sepsis ([place]) 11/03/2012  . Protein-calorie malnutrition, severe ([place]) 11/03/2012  .  Encephalopathy acute 11/03/2012  . Chronic kidney disease, stage IV (severe) ([place]) 07/02/2012  . 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 Kidney stone 04/13/2012   PCP:  [name], MD Pharmacy:   [facility], [place] [address] [place] [address] Phone: [phone] Fax: [phone]     Social Determinants of Health (SDOH) Interventions    Readmission Risk Interventions No flowsheet data  found.

## 2019-01-24 NOTE — Plan of Care (Signed)

## 2019-01-24 NOTE — Care Management Important Message (Signed)
Important Message  Patient Details  Name: Alan Small MRN: [id number] Date of Birth: 06/09/1955   Medicare Important Message Given:  Other (see comment)(Faxed to [name] with [facility] at [phone])     Shelbie Hutching, RN 01/24/2019, 12:30 PM

## 2019-01-24 NOTE — Progress Notes (Signed)
[facility]  ROUNDING NOTE   Subjective:   States he is feeling better Ate breakfast and ordered lunch  S Creatinine has improved to 2.3  Objective:  Vital signs in last 24 hours:  Temp:  [98 F (36.7 C)-98.8 F (37.1 C)] 98.8 F (37.1 C) (08/24 0502) Pulse Rate:  [90-94] 93 (08/24 0502) Resp:  [16-20] 16 (08/23 2231) BP: (138-178)/(84-92) 156/85 (08/24 0502) SpO2:  [92 %-96 %] 92 % (08/24 0502)  Weight change:  Filed Weights   01/20/19 0828  Weight: 74.8 kg    Intake/Output: I/O last 3 completed shifts: In: 245.2 [I.V.:146.2; IV Piggyback:99] Out: 1100 [Urine:1100]   Intake/Output this shift:  No intake/output data recorded.  Physical Exam: General: NAD,   Head: Normocephalic, atraumatic. Moist oral mucosal membranes  Eyes: Anicteric,    Neck: Supple, trachea midline  Lungs:  Clear to auscultation  Heart: Regular rate and rhythm  Abdomen:  Soft, nontender,   Extremities:  no peripheral edema.  Neurologic: Nonfocal, moving all four extremities  Skin: No lesions        Basic Metabolic Panel: Recent Labs  Lab 01/20/19 0844 01/21/19 0544 01/22/19 0426 01/23/19 0646 01/24/19 0548  NA 138 134* 136 139 140  K 4.4 5.2* 4.4 4.1 3.6  CL 110 112* 108 114* 109  CO2 19* 15* 17* 15* 20*  GLUCOSE 108* 106* 117* 94 101*  BUN 41* 54* 61* 50* 36*  CREATININE 2.78* 5.06* 5.01* 2.90* 2.27*  CALCIUM 9.4 8.5* 9.0 8.9 8.9  MG 1.8  --   --   --   --     Liver Function Tests: Recent Labs  Lab 01/20/19 0844  AST 26  ALT 18  ALKPHOS 105  BILITOT 0.9  PROT 7.2  ALBUMIN 3.6   No results for input(s): LIPASE, AMYLASE in the last 168 hours. No results for input(s): AMMONIA in the last 168 hours.  CBC: Recent Labs  Lab 01/20/19 0844 01/21/19 0544 01/22/19 0426 01/23/19 0646 01/24/19 0548  WBC 14.6* 17.0* 13.6* 11.6* 10.9*  NEUTROABS 13.7*  --   --   --   --   HGB 14.4 13.6 12.6* 11.7* 11.8*  HCT 43.2 42.8 39.4 35.8* 35.4*  MCV 92.1 96.8 96.8  92.7 91.2  PLT 156 132* 131* 151 169    Cardiac Enzymes: No results for input(s): CKTOTAL, CKMB, CKMBINDEX, TROPONINI in the last 168 hours.  BNP: Invalid input(s): POCBNP  CBG: No results for input(s): GLUCAP in the last 168 hours.  Microbiology: Results for orders placed or performed during the hospital encounter of 01/20/19  SARS Coronavirus 2 [facility] order, Performed in [facility] hospital lab) Nasopharyngeal Nasopharyngeal Swab     Status: None   Collection Time: 01/20/19  8:44 AM   Specimen: Nasopharyngeal Swab  Result Value Ref Range Status   SARS Coronavirus 2 NEGATIVE NEGATIVE Final    Comment: (NOTE) If result is NEGATIVE SARS-CoV-2 target nucleic acids are NOT DETECTED. The SARS-CoV-2 RNA is generally detectable in upper and lower  respiratory specimens during the acute phase of infection. The lowest  concentration of SARS-CoV-2 viral copies this assay can detect is 250  copies / mL. A negative result does not preclude SARS-CoV-2 infection  and should not be used as the sole basis for treatment or other  patient management decisions.  A negative result may occur with  improper specimen collection / handling, submission of specimen other  than nasopharyngeal swab, presence of viral mutation(s) within the  areas  targeted by this assay, and inadequate number of viral copies  (<250 copies / mL). A negative result must be combined with clinical  observations, patient history, and epidemiological information. If result is POSITIVE SARS-CoV-2 target nucleic acids are DETECTED. The SARS-CoV-2 RNA is generally detectable in upper and lower  respiratory specimens dur ing the acute phase of infection.  Positive  results are indicative of active infection with SARS-CoV-2.  Clinical  correlation with patient history and other diagnostic information is  necessary to determine patient infection status.  Positive results do  not rule out bacterial infection or co-infection  with other viruses. If result is PRESUMPTIVE POSTIVE SARS-CoV-2 nucleic acids MAY BE PRESENT.   A presumptive positive result was obtained on the submitted specimen  and confirmed on repeat testing.  While 2019 novel coronavirus  (SARS-CoV-2) nucleic acids may be present in the submitted sample  additional confirmatory testing may be necessary for epidemiological  and / or clinical management purposes  to differentiate between  SARS-CoV-2 and other Sarbecovirus currently known to infect humans.  If clinically indicated additional testing with an alternate test  methodology [phone]) is advised. The SARS-CoV-2 RNA is generally  detectable in upper and lower respiratory sp ecimens during the acute  phase of infection. The expected result is Negative. Fact Sheet for Patients:  StrictlyIdeas.no Fact Sheet for Healthcare Providers: BankingDealers.co.za This test is not yet approved or cleared by the [place] FDA and has been authorized for detection and/or diagnosis of SARS-CoV-2 by FDA under an Emergency Use Authorization (EUA).  This EUA will remain in effect (meaning this test can be used) for the duration of the COVID-19 declaration under Section 564(b)(1) of the Act, 21 U.S.C. section 360bbb-3(b)(1), unless the authorization is terminated or revoked sooner. Performed at [facility], [address] [place], [place] [address]   Culture, blood (Routine x 2)     Status: None (Preliminary result)   Collection Time: 01/20/19  8:45 AM   Specimen: BLOOD  Result Value Ref Range Status   Specimen Description BLOOD LEFT AC  Final   Special Requests   Final    BOTTLES DRAWN AEROBIC AND ANAEROBIC Blood Culture results may not be optimal due to an excessive volume of blood received in culture bottles   Culture   Final    NO GROWTH 4 DAYS Performed at [facility], [address], [place], [place] [address]    Report  Status PENDING  Incomplete  Culture, blood (Routine x 2)     Status: None (Preliminary result)   Collection Time: 01/20/19  8:45 AM   Specimen: BLOOD  Result Value Ref Range Status   Specimen Description BLOOD RIGHT AC  Final   Special Requests   Final    BOTTLES DRAWN AEROBIC AND ANAEROBIC Blood Culture results may not be optimal due to an excessive volume of blood received in culture bottles   Culture   Final    NO GROWTH 4 DAYS Performed at [facility], [address] [place], [place] [address]    Report Status PENDING  Incomplete  Gastrointestinal Panel by PCR , Stool     Status: None   Collection Time: 01/20/19  2:04 PM   Specimen: Stool  Result Value Ref Range Status   Campylobacter species NOT DETECTED NOT DETECTED Final   Plesimonas shigelloides NOT DETECTED NOT DETECTED Final   Salmonella species NOT DETECTED NOT DETECTED Final   Yersinia enterocolitica NOT DETECTED NOT DETECTED Final  Vibrio species NOT DETECTED NOT DETECTED Final   Vibrio cholerae NOT DETECTED NOT DETECTED Final   Enteroaggregative E coli (EAEC) NOT DETECTED NOT DETECTED Final   Enteropathogenic E coli (EPEC) NOT DETECTED NOT DETECTED Final   Enterotoxigenic E coli (ETEC) NOT DETECTED NOT DETECTED Final   Shiga like toxin producing E coli (STEC) NOT DETECTED NOT DETECTED Final   Shigella/Enteroinvasive E coli (EIEC) NOT DETECTED NOT DETECTED Final   Cryptosporidium NOT DETECTED NOT DETECTED Final   Cyclospora cayetanensis NOT DETECTED NOT DETECTED Final   Entamoeba histolytica NOT DETECTED NOT DETECTED Final   Giardia lamblia NOT DETECTED NOT DETECTED Final   Adenovirus F40/41 NOT DETECTED NOT DETECTED Final   Astrovirus NOT DETECTED NOT DETECTED Final   Norovirus GI/GII NOT DETECTED NOT DETECTED Final   Rotavirus A NOT DETECTED NOT DETECTED Final   Sapovirus (I, II, IV, and V) NOT DETECTED NOT DETECTED Final    Comment: Performed at [facility], [address]  [place], [place] [address]  C difficile quick scan w PCR reflex     Status: None   Collection Time: 01/20/19  2:04 PM   Specimen: STOOL  Result Value Ref Range Status   C Diff antigen NEGATIVE NEGATIVE Final   C Diff toxin NEGATIVE NEGATIVE Final   C Diff interpretation No C. difficile detected.  Final    Comment: Performed at [facility], [address] [place], [place] [address]  MRSA PCR Screening     Status: Abnormal   Collection Time: 01/20/19  4:48 PM   Specimen: Nasopharyngeal  Result Value Ref Range Status   MRSA by PCR POSITIVE (A) NEGATIVE Final    Comment:        The GeneXpert MRSA Assay (FDA approved for NASAL specimens only), is one component of a comprehensive MRSA colonization surveillance program. It is not intended to diagnose MRSA infection nor to guide or monitor treatment for MRSA infections. RESULT CALLED TO, READ BACK BY AND VERIFIED WITH: DEDRA [facility] 01/20/19 @ 1857  [place] Performed at [facility], [address] [place], [place] [address]   Urine Culture     Status: None   Collection Time: 01/20/19 10:02 PM   Specimen: Urine, Random  Result Value Ref Range Status   Specimen Description   Final    URINE, RANDOM Performed at [facility], [address] [place], [place] [address]    Special Requests   Final    NONE Performed at [facility], [address] [place],  [address]    Culture   Final    Multiple bacterial morphotypes present, none predominant. Suggest appropriate recollection if clinically indicated.   Report Status 01/22/2019 FINAL  Final    Coagulation Studies: No results for input(s): LABPROT, INR in the last 72 hours.  Urinalysis: No results for input(s): COLORURINE, LABSPEC, PHURINE, GLUCOSEU, HGBUR, BILIRUBINUR, KETONESUR, PROTEINUR, UROBILINOGEN, NITRITE, LEUKOCYTESUR in the last 72 hours.  Invalid input(s): APPERANCEUR    Imaging: No results found.   Medications:   .  azithromycin 250 mg (01/24/19 1008)  . cefTRIAXone (ROCEPHIN)  IV 1 g (01/24/19 1007)  . sodium bicarbonate 150 mEq in dextrose 5% 1000 mL 50 mL/hr at 01/23/19 1500   . Chlorhexidine Gluconate Cloth  6 each Topical Q0600  . FLUoxetine  60 mg Oral BH-q7a  . heparin  5,000 Units Subcutaneous Q8H  . levothyroxine  50 mcg Oral QAC breakfast  . mometasone-formoterol  2 puff Inhalation BID  .  mupirocin ointment  1 application Nasal BID  . paliperidone  156 mg Intramuscular Q30 days  . tamsulosin  0.4 mg Oral BH-q7a   acetaminophen, butalbital-acetaminophen-caffeine, docusate sodium, hydrocortisone, ipratropium-albuterol, oxyCODONE-acetaminophen  Assessment/ Plan:  [name] is a 63 y.o. white male with schizophrenia, hypertension, nephrolithiasis, COPD, atonic bladder, peptic ulcer disease, seizure disorder, CVA, coronary artery disease who is admitted to [facility] on 01/20/2019 for pneumonia and acute renal failure. Underwent cystoscopy and left stent placement by Dr. Bernardo Heater on 8/21.   1. Acute renal failure with hyperkalemia, hyponatremia and metabolic acidosis on chronic kidney disease stage III: baseline creatinine of 1.87, GFR of 37. Solitary Kidney. Followed by [facility], Dr. Rockwell Germany.  Chronic kidney disease secondary to nephrolithiasis and obstructive uropathy.  Acute renal failure secondary to obstructive uropathy.  - S Creatinine improving slowly  2. Obstructive uropathy - s/p ureteral stent on 01/21/2019  for ARF secondary to renal pelvic calculus in solitary kidney  3. Sepsis: with pneumonia and acute exacerbation of COPD - supportive treatment. Azithromycin and Ceftriaxone as per hospitalist team   LOS: [place] 8/24/20202:01 PM

## 2019-01-24 NOTE — Discharge Summary (Addendum)
[place] [facility] [place] NAME: Alan Small    MR#:  [id number]  DATE OF BIRTH:  Oct 17, 1955  DATE OF ADMISSION:  01/20/2019 ADMITTING PHYSICIAN: Vaughan Basta, MD  DATE OF DISCHARGE: 01/24/2019   PRIMARY CARE PHYSICIAN: [name], MD    ADMISSION DIAGNOSIS:  Acute kidney injury ([place]) [N17.9] Community acquired pneumonia, unspecified laterality [J18.9]  DISCHARGE DIAGNOSIS:  Active Problems:   Sepsis ([place])   Community acquired pneumonia   Gastroenteritis   SECONDARY DIAGNOSIS:   Past Medical History:  Diagnosis Date  . Asthma   . COPD (chronic obstructive pulmonary disease) ([place])   . Depression   . External hemorrhoids   . Gastric ulcer   . GERD (gastroesophageal reflux disease)   . Hydronephrosis   . Hypothyroidism    hypo  . Insomnia   . Mental disorder   . Myocardial infarction ([place])   . Paranoid schizophrenia ([place])   . Renal disorder    renal failure  . Seizures ([place])   . Stroke [facility])     HOSPITAL COURSE:   *Sepsis Due to pneumonia, UTI  UA positive. He also have complaints of diarrhea and abdominal pain- Stool studies negative. C diff negative.  GI complaints resolved now.  on antibiotics, IV fluids. Blood culture is sent- negative so far. Oral Abx for 4-5 days on discharge.  * Ac on ch renal failure stage 3, metabolic acidosis, obstructive uropathy-left renal stone Significant worsening.  Ultrasound renal shows some hydronephrosis, called nephrology and urology consult.   Left ureteral stent was placed 01/21/19- renal function is improving now. Advised to follow-up with [facility] urology services in next 1 to 2 weeks. Check renal function in next 1 week.  *Community-acquired pneumonia IV antibiotics and fluids as mentioned above.  *Diarrhea negative for GI panel and C. Difficile. Diarrhea resolved.  *COPD Continue home inhalers, no exacerbation  symptoms.  *Hypothyroid Continue levothyroxine.  *Hyperkalemia Given 1 dose of [place], improved now.  DISCHARGE CONDITIONS:   Stable.  CONSULTS OBTAINED:  Treatment Team:  Abbie Sons, MD  DRUG ALLERGIES:   Allergies  Allergen Reactions  . Mellaril [Thioridazine] Shortness Of Breath and Swelling  . Buprenorphine Itching  . Morphine And Related Itching  . Navane [Thiothixene] Other (See Comments)    Reaction:  GI upset   . Thorazine [Chlorpromazine] Other (See Comments)    Reaction:  Dizziness/fainting     DISCHARGE MEDICATIONS:   Allergies as of 01/24/2019      Reactions   Mellaril [thioridazine] Shortness Of Breath, Swelling   Buprenorphine Itching   Morphine And Related Itching   Navane [thiothixene] Other (See Comments)   Reaction:  GI upset    Thorazine [chlorpromazine] Other (See Comments)   Reaction:  Dizziness/fainting       Medication List    STOP taking these medications   hydrocortisone 25 MG suppository Commonly known as: ANUSOL-HC   nystatin powder Commonly known as: MYCOSTATIN/NYSTOP     TAKE these medications   acetaminophen 500 MG tablet Commonly known as: TYLENOL Take 1,000 mg by mouth 2 (two) times daily as needed for mild pain, moderate pain or fever.   cefUROXime 250 MG tablet Commonly known as: Ceftin Take 1 tablet (250 mg total) by mouth 2 (two) times daily for 5 days.   Combivent 18-103 MCG/ACT inhaler Generic drug: albuterol-ipratropium Inhale 1 puff into the lungs every 6 (six) hours as needed for wheezing or shortness of breath.   FLUoxetine  20 MG capsule Commonly known as: PROZAC Take 60 mg by mouth every morning.   Invega Sustenna 156 MG/ML Susy injection Generic drug: paliperidone Inject 156 mg into the muscle every 30 (thirty) days.   levothyroxine 50 MCG tablet Commonly known as: SYNTHROID Take 50 mcg by mouth daily before breakfast.   oxyCODONE-acetaminophen 5-325 MG tablet Commonly known as:  PERCOCET/ROXICET Take 1 tablet by mouth every 8 (eight) hours as needed for moderate pain or severe pain.   sodium bicarbonate 650 MG tablet Take 650 mg by mouth 2 (two) times daily.   Symbicort 160-4.5 MCG/ACT inhaler Generic drug: budesonide-formoterol Inhale 1 puff into the lungs 2 (two) times daily.   tamsulosin 0.4 MG Caps capsule Commonly known as: FLOMAX Take 0.4 mg by mouth every morning.        DISCHARGE INSTRUCTIONS:    Follow with Urology in 1-2 weeks at [facility]. Follow renal function in 1 week.  If you experience worsening of your admission symptoms, develop shortness of breath, life threatening emergency, suicidal or homicidal thoughts you must seek medical attention immediately by calling 911 or calling your MD immediately  if symptoms less severe.  You Must read complete instructions/literature along with all the possible adverse reactions/side effects for all the Medicines you take and that have been prescribed to you. Take any new Medicines after you have completely understood and accept all the possible adverse reactions/side effects.   Please note  You were cared for by a hospitalist during your hospital stay. If you have any questions about your discharge medications or the care you received while you were in the hospital after you are discharged, you can call the unit and asked to speak with the hospitalist on call if the hospitalist that took care of you is not available. Once you are discharged, your primary care physician will handle any further medical issues. Please note that NO REFILLS for any discharge medications will be authorized once you are discharged, as it is imperative that you return to your primary care physician (or establish a relationship with a primary care physician if you do not have one) for your aftercare needs so that they can reassess your need for medications and monitor your lab values.    Today   CHIEF COMPLAINT:   Chief Complaint   Patient presents with  . Chills  . Fever  . Emesis    HISTORY OF PRESENT ILLNESS:  Alan Small  is a 63 y.o. male with a known history of asthma, COPD, depression, gastric ulcer, hydronephrosis, hypothyroidism, myocardial infarction, schizophrenia, chronic renal failure stage III, seizures, stroke-lives in a group home.  He came to emergency room with complaint of nausea and diarrhea along with some fever chills and excessive sweating for last 1 day.  He denies any cough or shortness of breath but he had some abdominal pain. In ER he was noted to be septic with tachycardia, tachypnea, fever, elevated white blood cell count.  His chest x-ray was suspicious of some infiltrate and has high procalcitonin. His COVID-19 test was negative.  Urinalysis was still not collected.  Patient was given to hospitalist team to admit for further management.  VITAL SIGNS:  Blood pressure (!) 156/85, pulse 93, temperature 98.8 F (37.1 C), temperature source Oral, resp. rate 16, height 6\' 4"  (1.93 m), weight 74.8 kg, SpO2 92 %.  I/O:    Intake/Output Summary (Last 24 hours) at 01/24/2019 1426 Last data filed at 01/24/2019 0526 Gross per 24 hour  Intake  146.17 ml  Output 400 ml  Net -253.83 ml    PHYSICAL EXAMINATION:  GENERAL:  63 y.o.-year-old patient lying in the bed with no acute distress.  EYES: Pupils equal, round, reactive to light and accommodation. No scleral icterus. Extraocular muscles intact.  HEENT: Head atraumatic, normocephalic. Oropharynx and nasopharynx clear.  NECK:  Supple, no jugular venous distention. No thyroid enlargement, no tenderness.  LUNGS: Normal breath sounds bilaterally, no wheezing, rales,rhonchi or crepitation. No use of accessory muscles of respiration.  CARDIOVASCULAR: S1, S2 normal. No murmurs, rubs, or gallops.  ABDOMEN: Soft, non-tender, non-distended. Bowel sounds present. No organomegaly or mass.  EXTREMITIES: No pedal edema, cyanosis, or clubbing.  NEUROLOGIC:  Cranial nerves II through XII are intact. Muscle strength 5/5 in all extremities. Sensation intact. Gait not checked.  PSYCHIATRIC: The patient is alert and oriented x 3.  SKIN: No obvious rash, lesion, or ulcer.   DATA REVIEW:   CBC Recent Labs  Lab 01/24/19 0548  WBC 10.9*  HGB 11.8*  HCT 35.4*  PLT 169    Chemistries  Recent Labs  Lab 01/20/19 0844  01/24/19 0548  NA 138   < > 140  K 4.4   < > 3.6  CL 110   < > 109  CO2 19*   < > 20*  GLUCOSE 108*   < > 101*  BUN 41*   < > 36*  CREATININE 2.78*   < > 2.27*  CALCIUM 9.4   < > 8.9  MG 1.8  --   --   AST 26  --   --   ALT 18  --   --   ALKPHOS 105  --   --   BILITOT 0.9  --   --    < > = values in this interval not displayed.    Cardiac Enzymes No results for input(s): TROPONINI in the last 168 hours.  Microbiology Results  Results for orders placed or performed during the hospital encounter of 01/20/19  SARS Coronavirus 2 [facility] order, Performed in [facility] hospital lab) Nasopharyngeal Nasopharyngeal Swab     Status: None   Collection Time: 01/20/19  8:44 AM   Specimen: Nasopharyngeal Swab  Result Value Ref Range Status   SARS Coronavirus 2 NEGATIVE NEGATIVE Final    Comment: (NOTE) If result is NEGATIVE SARS-CoV-2 target nucleic acids are NOT DETECTED. The SARS-CoV-2 RNA is generally detectable in upper and lower  respiratory specimens during the acute phase of infection. The lowest  concentration of SARS-CoV-2 viral copies this assay can detect is 250  copies / mL. A negative result does not preclude SARS-CoV-2 infection  and should not be used as the sole basis for treatment or other  patient management decisions.  A negative result may occur with  improper specimen collection / handling, submission of specimen other  than nasopharyngeal swab, presence of viral mutation(s) within the  areas targeted by this assay, and inadequate number of viral copies  (<250 copies / mL). A negative result must be  combined with clinical  observations, patient history, and epidemiological information. If result is POSITIVE SARS-CoV-2 target nucleic acids are DETECTED. The SARS-CoV-2 RNA is generally detectable in upper and lower  respiratory specimens dur ing the acute phase of infection.  Positive  results are indicative of active infection with SARS-CoV-2.  Clinical  correlation with patient history and other diagnostic information is  necessary to determine patient infection status.  Positive results do  not rule out bacterial  infection or co-infection with other viruses. If result is PRESUMPTIVE POSTIVE SARS-CoV-2 nucleic acids MAY BE PRESENT.   A presumptive positive result was obtained on the submitted specimen  and confirmed on repeat testing.  While 2019 novel coronavirus  (SARS-CoV-2) nucleic acids may be present in the submitted sample  additional confirmatory testing may be necessary for epidemiological  and / or clinical management purposes  to differentiate between  SARS-CoV-2 and other Sarbecovirus currently known to infect humans.  If clinically indicated additional testing with an alternate test  methodology [phone]) is advised. The SARS-CoV-2 RNA is generally  detectable in upper and lower respiratory sp ecimens during the acute  phase of infection. The expected result is Negative. Fact Sheet for Patients:  StrictlyIdeas.no Fact Sheet for Healthcare Providers: BankingDealers.co.za This test is not yet approved or cleared by the [place] FDA and has been authorized for detection and/or diagnosis of SARS-CoV-2 by FDA under an Emergency Use Authorization (EUA).  This EUA will remain in effect (meaning this test can be used) for the duration of the COVID-19 declaration under Section 564(b)(1) of the Act, 21 U.S.C. section 360bbb-3(b)(1), unless the authorization is terminated or revoked sooner. Performed at [facility], [address] [place], [place] [address]   Culture, blood (Routine x 2)     Status: None (Preliminary result)   Collection Time: 01/20/19  8:45 AM   Specimen: BLOOD  Result Value Ref Range Status   Specimen Description BLOOD LEFT AC  Final   Special Requests   Final    BOTTLES DRAWN AEROBIC AND ANAEROBIC Blood Culture results may not be optimal due to an excessive volume of blood received in culture bottles   Culture   Final    NO GROWTH 4 DAYS Performed at [facility], [address], [place], [place] [address]    Report Status PENDING  Incomplete  Culture, blood (Routine x 2)     Status: None (Preliminary result)   Collection Time: 01/20/19  8:45 AM   Specimen: BLOOD  Result Value Ref Range Status   Specimen Description BLOOD RIGHT [facility]  Final   Special Requests   Final    BOTTLES DRAWN AEROBIC AND ANAEROBIC Blood Culture results may not be optimal due to an excessive volume of blood received in culture bottles   Culture   Final    NO GROWTH 4 DAYS Performed at [facility], [address] [place],  [address]    Report Status PENDING  Incomplete  Gastrointestinal Panel by PCR , Stool     Status: None   Collection Time: 01/20/19  2:04 PM   Specimen: Stool  Result Value Ref Range Status   Campylobacter species NOT DETECTED NOT DETECTED Final   Plesimonas shigelloides NOT DETECTED NOT DETECTED Final   Salmonella species NOT DETECTED NOT DETECTED Final   Yersinia enterocolitica NOT DETECTED NOT DETECTED Final   Vibrio species NOT DETECTED NOT DETECTED Final   Vibrio cholerae NOT DETECTED NOT DETECTED Final   Enteroaggregative E coli (EAEC) NOT DETECTED NOT DETECTED Final   Enteropathogenic E coli (EPEC) NOT DETECTED NOT DETECTED Final   Enterotoxigenic E coli (ETEC) NOT DETECTED NOT DETECTED Final   Shiga like toxin producing E coli (STEC) NOT DETECTED NOT DETECTED Final   Shigella/Enteroinvasive E coli (EIEC) NOT DETECTED NOT DETECTED Final    Cryptosporidium NOT DETECTED NOT DETECTED Final   Cyclospora cayetanensis NOT DETECTED NOT DETECTED Final   Entamoeba histolytica NOT DETECTED NOT DETECTED  Final   Giardia lamblia NOT DETECTED NOT DETECTED Final   Adenovirus F40/41 NOT DETECTED NOT DETECTED Final   Astrovirus NOT DETECTED NOT DETECTED Final   Norovirus GI/GII NOT DETECTED NOT DETECTED Final   Rotavirus A NOT DETECTED NOT DETECTED Final   Sapovirus (I, II, IV, and V) NOT DETECTED NOT DETECTED Final    Comment: Performed at [facility], [address] [place], [place] [address]  C difficile quick scan w PCR reflex     Status: None   Collection Time: 01/20/19  2:04 PM   Specimen: STOOL  Result Value Ref Range Status   C Diff antigen NEGATIVE NEGATIVE Final   C Diff toxin NEGATIVE NEGATIVE Final   C Diff interpretation No C. difficile detected.  Final    Comment: Performed at [facility], [address] [place], [place] [address]  MRSA PCR Screening     Status: Abnormal   Collection Time: 01/20/19  4:48 PM   Specimen: Nasopharyngeal  Result Value Ref Range Status   MRSA by PCR POSITIVE (A) NEGATIVE Final    Comment:        The GeneXpert MRSA Assay (FDA approved for NASAL specimens only), is one component of a comprehensive MRSA colonization surveillance program. It is not intended to diagnose MRSA infection nor to guide or monitor treatment for MRSA infections. RESULT CALLED TO, READ BACK BY AND VERIFIED WITH: DEDRA [facility] 01/20/19 @ 1857  [place] Performed at [facility], [address] [place], [place] [address]   Urine Culture     Status: None   Collection Time: 01/20/19 10:02 PM   Specimen: Urine, Random  Result Value Ref Range Status   Specimen Description   Final    URINE, RANDOM Performed at [facility], [address] [place], [place] [address]    Special Requests   Final    NONE Performed at [facility], [address] [place],   [address]    Culture   Final    Multiple bacterial morphotypes present, none predominant. Suggest appropriate recollection if clinically indicated.   Report Status 01/22/2019 FINAL  Final    RADIOLOGY:  No results found.  EKG:   Orders placed or performed during the hospital encounter of 01/20/19  . EKG 12-Lead  . EKG 12-Lead  . ED EKG 12-Lead  . ED EKG 12-Lead      Management plans discussed with the patient, family and they are in agreement.  CODE STATUS:     Code Status Orders  (From admission, onward)         Start     Ordered   01/20/19 1332  Full code  Continuous     01/20/19 1331        Code Status History    Date Active Date Inactive Code Status Order ID Comments User Context   08/20/2018 1729 08/22/2018 1829 Full Code [id number]  Fritzi Mandes, MD Inpatient   02/26/2016 1748 02/28/2016 2109 Full Code [id number]  Henreitta Leber, MD Inpatient   05/06/2015 1856 05/15/2015 0115 Full Code [id number]  Henreitta Leber, MD Inpatient   01/17/2015 1206 01/22/2015 1645 Full Code [id number]  Hillary Bow, MD ED   11/03/2012 0300 11/11/2012 2134 Full Code [id number]  Rise Patience, MD Inpatient   Advance Care Planning Activity    Advance Directive Documentation     Most Recent Value  Type of Advance Directive  Healthcare Power of Attorney  Pre-existing out of facility  DNR order (yellow form or pink MOST form)  -  "MOST" Form in Place?  -      TOTAL TIME TAKING CARE OF THIS PATIENT: 35 minutes.    Vaughan Basta M.D on 01/24/2019 at 2:26 PM  Between 7am to 6pm - Pager - [phone]  After 6pm go to [email or web address] - password [email or web address] [facility]  Office  [phone]  CC: Primary care physician; [name], MD   Note: This dictation was prepared with Dragon dictation along with smaller phrase technology. Any transcriptional errors that result from this process are unintentional.

## 2019-01-24 NOTE — Progress Notes (Signed)
Urology Inpatient Progress Note  Subjective: Alan Small is a 63 y.o. male who is POD3 from cystoscopy with dilation of anterior urethral stricture and left ureteral stent placement with left retrograde pyelography with Dr. Bernardo Heater.  Creatinine downtrending to 2.27 today.  Today, patient states he is overall feeling better.  He denies flank pain, groin pain, dysuria, and gross hematuria.  He is voiding, empty urinal at bedside.  Vital signs notable for hypertension to 178/92 yesterday evening, most recently 156/85.  Vitals otherwise WNL.  Anti-infectives: Anti-infectives (From admission, onward)   Start     Dose/Rate Route Frequency Ordered Stop   01/21/19 1000  cefTRIAXone (ROCEPHIN) 1 g in sodium chloride 0.9 % 100 mL IVPB     1 g 200 mL/hr over 30 Minutes Intravenous Every 24 hours 01/20/19 1046     01/21/19 1000  azithromycin (ZITHROMAX) 250 mg in dextrose 5 % 125 mL IVPB     250 mg 125 mL/hr over 60 Minutes Intravenous Every 24 hours 01/20/19 1046     01/20/19 1015  cefTRIAXone (ROCEPHIN) 2 g in sodium chloride 0.9 % 100 mL IVPB  Status:  Discontinued     2 g 200 mL/hr over 30 Minutes Intravenous Every 24 hours 01/20/19 1011 01/20/19 1047   01/20/19 1015  azithromycin (ZITHROMAX) 500 mg in sodium chloride 0.9 % 250 mL IVPB  Status:  Discontinued     500 mg 250 mL/hr over 60 Minutes Intravenous Every 24 hours 01/20/19 1011 01/20/19 1048      Current Facility-Administered Medications  Medication Dose Route Frequency Provider Last Rate Last Dose  . acetaminophen (TYLENOL) tablet 1,000 mg  1,000 mg Oral BID PRN Stoioff, Scott C, MD   1,000 mg at 01/23/19 1200  . azithromycin (ZITHROMAX) 250 mg in dextrose 5 % 125 mL IVPB  250 mg Intravenous Q24H Stoioff, Scott C, MD 125 mL/hr at 01/24/19 1008 250 mg at 01/24/19 1008  . butalbital-acetaminophen-caffeine (FIORICET) 50-325-40 MG per tablet 1 tablet  1 tablet Oral Q6H PRN Harrie Foreman, MD   1 tablet at 01/24/19 [phone]  .  cefTRIAXone (ROCEPHIN) 1 g in sodium chloride 0.9 % 100 mL IVPB  1 g Intravenous Q24H Stoioff, Scott C, MD 200 mL/hr at 01/24/19 1007 1 g at 01/24/19 1007  . Chlorhexidine Gluconate Cloth 2 % PADS 6 each  6 each Topical Q0600 Abbie Sons, MD   6 each at 01/24/19 0606  . docusate sodium (COLACE) capsule 100 mg  100 mg Oral BID PRN Stoioff, Scott C, MD      . FLUoxetine (PROZAC) capsule 60 mg  60 mg Oral Barney Drain, Scott C, MD   60 mg at 01/24/19 0954  . heparin injection 5,000 Units  5,000 Units Subcutaneous Q8H Stoioff, Ronda Fairly, MD   5,000 Units at 01/24/19 0604  . hydrocortisone (ANUSOL-HC) suppository 25 mg  25 mg Rectal BID PRN Stoioff, Scott C, MD      . ipratropium-albuterol (DUONEB) 0.5-2.5 (3) MG/3ML nebulizer solution 3 mL  3 mL Inhalation Q6H PRN Stoioff, Scott C, MD      . levothyroxine (SYNTHROID) tablet 50 mcg  50 mcg Oral QAC breakfast Stoioff, Ronda Fairly, MD   50 mcg at 01/24/19 0604  . mometasone-formoterol (DULERA) 200-5 MCG/ACT inhaler 2 puff  2 puff Inhalation BID Abbie Sons, MD   2 puff at 01/24/19 0956  . mupirocin ointment (BACTROBAN) 2 % 1 application  1 application Nasal BID Stoioff, Ronda Fairly, MD  1 application at 43/15/40 0956  . oxyCODONE-acetaminophen (PERCOCET/ROXICET) 5-325 MG per tablet 1 tablet  1 tablet Oral Q6H PRN Abbie Sons, MD   1 tablet at 01/23/19 2232  . paliperidone (INVEGA SUSTENNA) injection 156 mg  156 mg Intramuscular Q30 days Stoioff, Scott C, MD      . sodium bicarbonate 150 mEq in dextrose 5% 1000 mL infusion  150 mEq Intravenous Continuous Kolluru, Sarath, MD 50 mL/hr at 01/23/19 1500    . tamsulosin (FLOMAX) capsule 0.4 mg  0.4 mg Oral BH-q7a Stoioff, Scott C, MD   0.4 mg at 01/24/19 0955     Objective: Vital signs in last 24 hours: Temp:  [98 F (36.7 C)-98.8 F (37.1 C)] 98.8 F (37.1 C) (08/24 0502) Pulse Rate:  [90-94] 93 (08/24 0502) Resp:  [16-20] 16 (08/23 2231) BP: (138-178)/(84-92) 156/85 (08/24 0502) SpO2:  [92  %-96 %] 92 % (08/24 0502)  Intake/Output from previous day: 08/23 0701 - 08/24 0700 In: 245.2 [I.V.:146.2; IV Piggyback:99] Out: 1100 [Urine:1100] Intake/Output this shift: No intake/output data recorded.  Physical Exam Vitals signs and nursing note reviewed.  Constitutional:      General: He is sleeping.     Appearance: He is not ill-appearing, toxic-appearing or diaphoretic.  HENT:     Head: Normocephalic and atraumatic.  Pulmonary:     Effort: Pulmonary effort is normal. No respiratory distress.  Abdominal:     General: There is no distension.     Palpations: Abdomen is soft.     Tenderness: There is abdominal tenderness (Mild suprapubic). There is no guarding or rebound.  Skin:    General: Skin is warm and dry.  Neurological:     Mental Status: He is oriented to person, place, and time and easily aroused.  Psychiatric:        Mood and Affect: Mood normal.        Behavior: Behavior normal. Behavior is cooperative.    Lab Results:  Recent Labs    01/23/19 0646 01/24/19 0548  WBC 11.6* 10.9*  HGB 11.7* 11.8*  HCT 35.8* 35.4*  PLT 151 169   BMET Recent Labs    01/23/19 0646 01/24/19 0548  NA 139 140  K 4.1 3.6  CL 114* 109  CO2 15* 20*  GLUCOSE 94 101*  BUN 50* 36*  CREATININE 2.90* 2.27*  CALCIUM 8.9 8.9   Assessment & Plan: Patient continues to improve, most notably with significant improvement in creatinine following stent placement.  No new recommendations today.  Continues to require follow-up with [facility] for definitive stone management upon discharge.  Debroah Loop, PA-C 01/24/2019

## 2019-01-24 NOTE — NC FL2 (Signed)
[place] LEVEL OF CARE SCREENING TOOL     IDENTIFICATION  Patient Name: Alan Small Birthdate: 11/07/1955 Sex: male Admission Date (Current Location): 01/20/2019  [place] and [place] Number:  Engineering geologist and Address:  [facility], [address], [place], [place] [address]      Provider Number: [id number]  Attending Physician Name and Address:  Vaughan Basta, *  Relative Name and Phone Number:  [name] [phone]    Current Level of Care: Hospital Recommended Level of Care: Family Care Home Prior Approval Number:    Date Approved/Denied:   PASRR Number:    Discharge Plan: Other (Comment)(Family Care home)    Current Diagnoses: Patient Active Problem List   Diagnosis Date Noted  . Community acquired pneumonia 01/20/2019  . Gastroenteritis 01/20/2019  . Blood in stool   . Change in bowel habits   . Constipation   . Polyp of sigmoid colon   . Depression 02/26/2016  . COPD (chronic obstructive pulmonary disease) ([place]) 02/03/2016  . Suprapubic catheter ([place]) 12/17/2015  . Urethral stricture 12/17/2015  . Urinary retention 10/27/2015  . Tobacco use disorder 08/27/2015  . Malnutrition of moderate degree 05/09/2015  . Sepsis ([place]) 05/06/2015  . UTI (lower urinary tract infection) 01/17/2015  . Hypoglycemia 01/17/2015  . Gastroesophageal reflux disease 10/28/2014  . Bright red rectal bleeding 10/26/2014  . Chronic constipation 10/26/2014  . Hydronephrosis of left kidney 01/17/2014  . Neurogenic bladder 06/21/2013  . Urolithiasis 03/15/2013  . Nephrolithiasis 03/04/2013  . Metabolic acidosis 33/82/5053  . Acute renal failure ([place]) 11/03/2012  . Hyperkalemia 11/03/2012  . Pyelonephritis 11/03/2012  . Schizophrenia ([place]) 11/03/2012  . Hypothyroidism 11/03/2012  . Severe sepsis ([place]) 11/03/2012  . Protein-calorie malnutrition, severe ([place]) 11/03/2012  . Encephalopathy acute 11/03/2012  . Chronic  kidney disease, stage IV (severe) ([place]) 07/02/2012  . 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 Kidney stone 04/13/2012    Orientation RESPIRATION BLADDER Height & Weight     Self, Time, Situation, Place  Normal Continent Weight: 74.8 kg Height:  6\' 4"  (193 cm)  BEHAVIORAL SYMPTOMS/MOOD NEUROLOGICAL BOWEL NUTRITION STATUS      Continent Diet  AMBULATORY STATUS COMMUNICATION OF NEEDS Skin   Supervision Verbally Normal                       Personal Care Assistance Level of Assistance  Bathing, Feeding, Dressing Bathing Assistance: Independent Feeding assistance: Independent Dressing Assistance: Independent     Functional Limitations Info             SPECIAL CARE FACTORS FREQUENCY  PT (By licensed PT)     PT Frequency: 2 times per week home health              Contractures Contractures Info: Not present    Additional Factors Info  Code Status, Allergies Code Status Info: full Allergies Info: Mellaril, buprenophine, morphine, navane, thorazine           Current Medications (01/24/2019):  This is the current hospital active medication list Current Facility-Administered Medications  Medication Dose Route Frequency Provider Last Rate Last Dose  . acetaminophen (TYLENOL) tablet 1,000 mg  1,000 mg Oral BID PRN Stoioff, Scott C, MD   1,000 mg at 01/23/19 1200  . azithromycin (ZITHROMAX) 250 mg in dextrose 5 % 125 mL IVPB  250 mg Intravenous Q24H Stoioff, Scott C, MD 125 mL/hr at 01/24/19 1008 250 mg at 01/24/19 1008  . butalbital-acetaminophen-caffeine (  FIORICET) 18-563-14 MG per tablet 1 tablet  1 tablet Oral Q6H PRN Harrie Foreman, MD   1 tablet at 01/24/19 0955  . cefTRIAXone (ROCEPHIN) 1 g in sodium chloride 0.9 % 100 mL IVPB  1 g Intravenous Q24H Stoioff, Scott C, MD 200 mL/hr at 01/24/19 1007 1 g at 01/24/19 1007  . Chlorhexidine Gluconate Cloth 2 % PADS 6 each  6 each Topical Q0600 Abbie Sons, MD   6 each at 01/24/19 0606  . docusate sodium (COLACE) capsule  100 mg  100 mg Oral BID PRN Stoioff, Scott C, MD      . FLUoxetine (PROZAC) capsule 60 mg  60 mg Oral Barney Drain, Scott C, MD   60 mg at 01/24/19 0954  . heparin injection 5,000 Units  5,000 Units Subcutaneous Q8H Stoioff, Ronda Fairly, MD   5,000 Units at 01/24/19 0604  . hydrocortisone (ANUSOL-HC) suppository 25 mg  25 mg Rectal BID PRN Stoioff, Scott C, MD      . ipratropium-albuterol (DUONEB) 0.5-2.5 (3) MG/3ML nebulizer solution 3 mL  3 mL Inhalation Q6H PRN Stoioff, Scott C, MD      . levothyroxine (SYNTHROID) tablet 50 mcg  50 mcg Oral QAC breakfast Stoioff, Ronda Fairly, MD   50 mcg at 01/24/19 0604  . mometasone-formoterol (DULERA) 200-5 MCG/ACT inhaler 2 puff  2 puff Inhalation BID Abbie Sons, MD   2 puff at 01/24/19 0956  . mupirocin ointment (BACTROBAN) 2 % 1 application  1 application Nasal BID Abbie Sons, MD   1 application at 97/02/63 0956  . oxyCODONE-acetaminophen (PERCOCET/ROXICET) 5-325 MG per tablet 1 tablet  1 tablet Oral Q6H PRN Abbie Sons, MD   1 tablet at 01/23/19 2232  . paliperidone (INVEGA SUSTENNA) injection 156 mg  156 mg Intramuscular Q30 days Stoioff, Scott C, MD      . sodium bicarbonate 150 mEq in dextrose 5% 1000 mL infusion  150 mEq Intravenous Continuous Kolluru, Sarath, MD 50 mL/hr at 01/23/19 1500    . tamsulosin (FLOMAX) capsule 0.4 mg  0.4 mg Oral Barney Drain, Ronda Fairly, MD   0.4 mg at 01/24/19 7858     Discharge Medications: Medication List    TAKE these medications   acetaminophen 500 MG tablet Commonly known as: TYLENOL Take 1,000 mg by mouth 2 (two) times daily as needed for mild pain, moderate pain or fever.   cefUROXime 250 MG tablet Commonly known as: Ceftin Take 1 tablet (250 mg total) by mouth 2 (two) times daily for 5 days.   Combivent 18-103 MCG/ACT inhaler Generic drug: albuterol-ipratropium Inhale 1 puff into the lungs every 6 (six) hours as needed for wheezing or shortness of breath.   FLUoxetine 20 MG  capsule Commonly known as: PROZAC Take 60 mg by mouth every morning.   hydrocortisone 25 MG suppository Commonly known as: ANUSOL-HC Place 25 mg rectally 2 (two) times daily as needed for hemorrhoids or anal itching.   Invega Sustenna 156 MG/ML Susy injection Generic drug: paliperidone Inject 156 mg into the muscle every 30 (thirty) days.   levothyroxine 50 MCG tablet Commonly known as: SYNTHROID Take 50 mcg by mouth daily before breakfast.   nystatin powder Commonly known as: MYCOSTATIN/NYSTOP Apply topically 2 (two) times daily.   oxyCODONE-acetaminophen 5-325 MG tablet Commonly known as: PERCOCET/ROXICET Take 1 tablet by mouth every 8 (eight) hours as needed for moderate pain or severe pain.   sodium bicarbonate 650 MG tablet Take 650 mg by mouth 2 (  two) times daily.   Symbicort 160-4.5 MCG/ACT inhaler Generic drug: budesonide-formoterol Inhale 1 puff into the lungs 2 (two) times daily.   tamsulosin 0.4 MG Caps capsule Commonly known as: FLOMAX Take 0.4 mg by mouth every morning.       Relevant Imaging Results:  Relevant Lab Results:   Additional Information SS# [id number]  Shelbie Hutching, RN

## 2019-01-25 LAB — CULTURE, BLOOD (ROUTINE X 2)
Culture: NO GROWTH
Culture: NO GROWTH

## 2019-05-11 ENCOUNTER — Emergency Department: Payer: Medicare Other | Admitting: Anesthesiology

## 2019-05-11 ENCOUNTER — Other Ambulatory Visit: Payer: Self-pay

## 2019-05-11 ENCOUNTER — Encounter
Admission: EM | Disposition: A | Discharge status: Other Acute Inpatient Hospital | Payer: Self-pay | Source: Home / Self Care | Attending: Internal Medicine

## 2019-05-11 ENCOUNTER — Emergency Department: Payer: Medicare Other

## 2019-05-11 ENCOUNTER — Inpatient Hospital Stay
Admission: EM | Admit: 2019-05-11 | Discharge: 2019-05-24 | DRG: 853 | Disposition: A | Discharge status: Other Acute Inpatient Hospital | Payer: Medicare Other | Attending: Internal Medicine | Admitting: Internal Medicine

## 2019-05-11 ENCOUNTER — Inpatient Hospital Stay: Payer: Medicare Other

## 2019-05-11 DIAGNOSIS — E876 Hypokalemia: Secondary | ICD-10-CM | POA: Diagnosis not present

## 2019-05-11 DIAGNOSIS — A419 Sepsis, unspecified organism: Secondary | ICD-10-CM

## 2019-05-11 DIAGNOSIS — N136 Pyonephrosis: Secondary | ICD-10-CM | POA: Diagnosis present

## 2019-05-11 DIAGNOSIS — I499 Cardiac arrhythmia, unspecified: Secondary | ICD-10-CM | POA: Diagnosis not present

## 2019-05-11 DIAGNOSIS — F200 Paranoid schizophrenia: Secondary | ICD-10-CM | POA: Diagnosis present

## 2019-05-11 DIAGNOSIS — J984 Other disorders of lung: Secondary | ICD-10-CM

## 2019-05-11 DIAGNOSIS — L02211 Cutaneous abscess of abdominal wall: Secondary | ICD-10-CM | POA: Diagnosis not present

## 2019-05-11 DIAGNOSIS — R0602 Shortness of breath: Secondary | ICD-10-CM

## 2019-05-11 DIAGNOSIS — R0600 Dyspnea, unspecified: Secondary | ICD-10-CM | POA: Diagnosis present

## 2019-05-11 DIAGNOSIS — M60009 Infective myositis, unspecified site: Secondary | ICD-10-CM | POA: Diagnosis not present

## 2019-05-11 DIAGNOSIS — N35912 Unspecified bulbous urethral stricture, male: Secondary | ICD-10-CM | POA: Diagnosis present

## 2019-05-11 DIAGNOSIS — Z938 Other artificial opening status: Secondary | ICD-10-CM

## 2019-05-11 DIAGNOSIS — N390 Urinary tract infection, site not specified: Secondary | ICD-10-CM | POA: Diagnosis not present

## 2019-05-11 DIAGNOSIS — I4891 Unspecified atrial fibrillation: Secondary | ICD-10-CM | POA: Diagnosis not present

## 2019-05-11 DIAGNOSIS — N132 Hydronephrosis with renal and ureteral calculous obstruction: Secondary | ICD-10-CM

## 2019-05-11 DIAGNOSIS — R7881 Bacteremia: Secondary | ICD-10-CM | POA: Diagnosis not present

## 2019-05-11 DIAGNOSIS — J9601 Acute respiratory failure with hypoxia: Secondary | ICD-10-CM

## 2019-05-11 DIAGNOSIS — N184 Chronic kidney disease, stage 4 (severe): Secondary | ICD-10-CM | POA: Diagnosis present

## 2019-05-11 DIAGNOSIS — Z9889 Other specified postprocedural states: Secondary | ICD-10-CM

## 2019-05-11 DIAGNOSIS — R296 Repeated falls: Secondary | ICD-10-CM | POA: Diagnosis present

## 2019-05-11 DIAGNOSIS — L03311 Cellulitis of abdominal wall: Secondary | ICD-10-CM | POA: Diagnosis not present

## 2019-05-11 DIAGNOSIS — Z4659 Encounter for fitting and adjustment of other gastrointestinal appliance and device: Secondary | ICD-10-CM

## 2019-05-11 DIAGNOSIS — D72829 Elevated white blood cell count, unspecified: Secondary | ICD-10-CM | POA: Diagnosis not present

## 2019-05-11 DIAGNOSIS — J90 Pleural effusion, not elsewhere classified: Secondary | ICD-10-CM | POA: Diagnosis not present

## 2019-05-11 DIAGNOSIS — I4892 Unspecified atrial flutter: Secondary | ICD-10-CM | POA: Diagnosis not present

## 2019-05-11 DIAGNOSIS — Y832 Surgical operation with anastomosis, bypass or graft as the cause of abnormal reaction of the patient, or of later complication, without mention of misadventure at the time of the procedure: Secondary | ICD-10-CM | POA: Diagnosis present

## 2019-05-11 DIAGNOSIS — N201 Calculus of ureter: Secondary | ICD-10-CM | POA: Diagnosis not present

## 2019-05-11 DIAGNOSIS — I76 Septic arterial embolism: Secondary | ICD-10-CM | POA: Diagnosis present

## 2019-05-11 DIAGNOSIS — T80211A Bloodstream infection due to central venous catheter, initial encounter: Secondary | ICD-10-CM | POA: Diagnosis present

## 2019-05-11 DIAGNOSIS — E872 Acidosis, unspecified: Secondary | ICD-10-CM | POA: Diagnosis present

## 2019-05-11 DIAGNOSIS — G9340 Encephalopathy, unspecified: Secondary | ICD-10-CM | POA: Diagnosis present

## 2019-05-11 DIAGNOSIS — F10239 Alcohol dependence with withdrawal, unspecified: Secondary | ICD-10-CM | POA: Diagnosis present

## 2019-05-11 DIAGNOSIS — K219 Gastro-esophageal reflux disease without esophagitis: Secondary | ICD-10-CM | POA: Diagnosis present

## 2019-05-11 DIAGNOSIS — N170 Acute kidney failure with tubular necrosis: Secondary | ICD-10-CM | POA: Diagnosis present

## 2019-05-11 DIAGNOSIS — F329 Major depressive disorder, single episode, unspecified: Secondary | ICD-10-CM | POA: Diagnosis present

## 2019-05-11 DIAGNOSIS — Z5309 Procedure and treatment not carried out because of other contraindication: Secondary | ICD-10-CM

## 2019-05-11 DIAGNOSIS — E039 Hypothyroidism, unspecified: Secondary | ICD-10-CM | POA: Diagnosis present

## 2019-05-11 DIAGNOSIS — Z1621 Resistance to vancomycin: Secondary | ICD-10-CM | POA: Diagnosis present

## 2019-05-11 DIAGNOSIS — N179 Acute kidney failure, unspecified: Secondary | ICD-10-CM

## 2019-05-11 DIAGNOSIS — I330 Acute and subacute infective endocarditis: Secondary | ICD-10-CM | POA: Diagnosis present

## 2019-05-11 DIAGNOSIS — F17210 Nicotine dependence, cigarettes, uncomplicated: Secondary | ICD-10-CM | POA: Diagnosis not present

## 2019-05-11 DIAGNOSIS — F32A Depression, unspecified: Secondary | ICD-10-CM | POA: Diagnosis present

## 2019-05-11 DIAGNOSIS — D638 Anemia in other chronic diseases classified elsewhere: Secondary | ICD-10-CM | POA: Diagnosis present

## 2019-05-11 DIAGNOSIS — A4102 Sepsis due to Methicillin resistant Staphylococcus aureus: Secondary | ICD-10-CM | POA: Diagnosis present

## 2019-05-11 DIAGNOSIS — B9562 Methicillin resistant Staphylococcus aureus infection as the cause of diseases classified elsewhere: Secondary | ICD-10-CM | POA: Diagnosis not present

## 2019-05-11 DIAGNOSIS — J441 Chronic obstructive pulmonary disease with (acute) exacerbation: Secondary | ICD-10-CM | POA: Diagnosis present

## 2019-05-11 DIAGNOSIS — E43 Unspecified severe protein-calorie malnutrition: Secondary | ICD-10-CM | POA: Diagnosis present

## 2019-05-11 DIAGNOSIS — I2690 Septic pulmonary embolism without acute cor pulmonale: Secondary | ICD-10-CM | POA: Diagnosis not present

## 2019-05-11 DIAGNOSIS — J9602 Acute respiratory failure with hypercapnia: Secondary | ICD-10-CM | POA: Diagnosis not present

## 2019-05-11 DIAGNOSIS — Z20828 Contact with and (suspected) exposure to other viral communicable diseases: Secondary | ICD-10-CM | POA: Diagnosis present

## 2019-05-11 DIAGNOSIS — Z823 Family history of stroke: Secondary | ICD-10-CM

## 2019-05-11 DIAGNOSIS — Z8619 Personal history of other infectious and parasitic diseases: Secondary | ICD-10-CM | POA: Diagnosis not present

## 2019-05-11 DIAGNOSIS — E870 Hyperosmolality and hypernatremia: Secondary | ICD-10-CM | POA: Diagnosis not present

## 2019-05-11 DIAGNOSIS — Z978 Presence of other specified devices: Secondary | ICD-10-CM | POA: Diagnosis not present

## 2019-05-11 DIAGNOSIS — Z23 Encounter for immunization: Secondary | ICD-10-CM

## 2019-05-11 DIAGNOSIS — Z8673 Personal history of transient ischemic attack (TIA), and cerebral infarction without residual deficits: Secondary | ICD-10-CM

## 2019-05-11 DIAGNOSIS — R6521 Severe sepsis with septic shock: Secondary | ICD-10-CM | POA: Diagnosis present

## 2019-05-11 DIAGNOSIS — E871 Hypo-osmolality and hyponatremia: Secondary | ICD-10-CM | POA: Diagnosis not present

## 2019-05-11 DIAGNOSIS — Z87442 Personal history of urinary calculi: Secondary | ICD-10-CM | POA: Diagnosis not present

## 2019-05-11 DIAGNOSIS — Z87448 Personal history of other diseases of urinary system: Secondary | ICD-10-CM | POA: Diagnosis not present

## 2019-05-11 DIAGNOSIS — B952 Enterococcus as the cause of diseases classified elsewhere: Secondary | ICD-10-CM | POA: Diagnosis not present

## 2019-05-11 DIAGNOSIS — Z960 Presence of urogenital implants: Secondary | ICD-10-CM | POA: Diagnosis not present

## 2019-05-11 DIAGNOSIS — Q600 Renal agenesis, unilateral: Secondary | ICD-10-CM | POA: Diagnosis not present

## 2019-05-11 DIAGNOSIS — E875 Hyperkalemia: Secondary | ICD-10-CM | POA: Diagnosis present

## 2019-05-11 DIAGNOSIS — Z79899 Other long term (current) drug therapy: Secondary | ICD-10-CM

## 2019-05-11 DIAGNOSIS — M6008 Infective myositis, other site: Secondary | ICD-10-CM | POA: Diagnosis not present

## 2019-05-11 DIAGNOSIS — N189 Chronic kidney disease, unspecified: Secondary | ICD-10-CM | POA: Diagnosis not present

## 2019-05-11 DIAGNOSIS — Z7951 Long term (current) use of inhaled steroids: Secondary | ICD-10-CM

## 2019-05-11 DIAGNOSIS — J449 Chronic obstructive pulmonary disease, unspecified: Secondary | ICD-10-CM | POA: Diagnosis not present

## 2019-05-11 DIAGNOSIS — A4902 Methicillin resistant Staphylococcus aureus infection, unspecified site: Secondary | ICD-10-CM | POA: Diagnosis not present

## 2019-05-11 DIAGNOSIS — I351 Nonrheumatic aortic (valve) insufficiency: Secondary | ICD-10-CM | POA: Diagnosis not present

## 2019-05-11 DIAGNOSIS — I484 Atypical atrial flutter: Secondary | ICD-10-CM | POA: Diagnosis not present

## 2019-05-11 DIAGNOSIS — M609 Myositis, unspecified: Secondary | ICD-10-CM | POA: Diagnosis not present

## 2019-05-11 DIAGNOSIS — J869 Pyothorax without fistula: Secondary | ICD-10-CM | POA: Diagnosis not present

## 2019-05-11 DIAGNOSIS — Z905 Acquired absence of kidney: Secondary | ICD-10-CM

## 2019-05-11 DIAGNOSIS — R451 Restlessness and agitation: Secondary | ICD-10-CM | POA: Diagnosis not present

## 2019-05-11 DIAGNOSIS — I252 Old myocardial infarction: Secondary | ICD-10-CM

## 2019-05-11 DIAGNOSIS — R509 Fever, unspecified: Secondary | ICD-10-CM | POA: Diagnosis not present

## 2019-05-11 DIAGNOSIS — Z885 Allergy status to narcotic agent status: Secondary | ICD-10-CM | POA: Diagnosis not present

## 2019-05-11 DIAGNOSIS — J80 Acute respiratory distress syndrome: Secondary | ICD-10-CM | POA: Diagnosis not present

## 2019-05-11 DIAGNOSIS — Z936 Other artificial openings of urinary tract status: Secondary | ICD-10-CM | POA: Diagnosis not present

## 2019-05-11 DIAGNOSIS — F209 Schizophrenia, unspecified: Secondary | ICD-10-CM | POA: Diagnosis present

## 2019-05-11 DIAGNOSIS — I493 Ventricular premature depolarization: Secondary | ICD-10-CM | POA: Diagnosis present

## 2019-05-11 DIAGNOSIS — R079 Chest pain, unspecified: Secondary | ICD-10-CM | POA: Diagnosis not present

## 2019-05-11 DIAGNOSIS — Z9911 Dependence on respirator [ventilator] status: Secondary | ICD-10-CM | POA: Diagnosis not present

## 2019-05-11 DIAGNOSIS — R6520 Severe sepsis without septic shock: Secondary | ICD-10-CM | POA: Diagnosis not present

## 2019-05-11 DIAGNOSIS — Z79890 Hormone replacement therapy (postmenopausal): Secondary | ICD-10-CM

## 2019-05-11 DIAGNOSIS — Z452 Encounter for adjustment and management of vascular access device: Secondary | ICD-10-CM

## 2019-05-11 DIAGNOSIS — N312 Flaccid neuropathic bladder, not elsewhere classified: Secondary | ICD-10-CM | POA: Diagnosis present

## 2019-05-11 DIAGNOSIS — K5900 Constipation, unspecified: Secondary | ICD-10-CM | POA: Diagnosis present

## 2019-05-11 DIAGNOSIS — Z6826 Body mass index (BMI) 26.0-26.9, adult: Secondary | ICD-10-CM

## 2019-05-11 HISTORY — PX: CYSTOSCOPY WITH URETEROSCOPY AND STENT PLACEMENT: SHX6377

## 2019-05-11 LAB — BLOOD GAS, ARTERIAL
Acid-base deficit: 10.1 mmol/L — ABNORMAL HIGH (ref 0.0–2.0)
Acid-base deficit: 7.9 mmol/L — ABNORMAL HIGH (ref 0.0–2.0)
Bicarbonate: 18.6 mmol/L — ABNORMAL LOW (ref 20.0–28.0)
Bicarbonate: 17.7 mmol/L — ABNORMAL LOW (ref 20.0–28.0)
FIO2: 0.5
FIO2: 0.5
MECHVT: 500 mL
MECHVT: 500 mL
O2 Saturation: 93.8 %
O2 Saturation: 98.6 %
PEEP: 5 cmH2O
PEEP: 5 cmH2O
Patient temperature: 37
Patient temperature: 37
RATE: 16 resp/min
RATE: 22 resp/min
pCO2 arterial: 51 mmHg — ABNORMAL HIGH (ref 32.0–48.0)
pCO2 arterial: 36 mmHg (ref 32.0–48.0)
pH, Arterial: 7.17 — CL (ref 7.350–7.450)
pH, Arterial: 7.3 — ABNORMAL LOW (ref 7.350–7.450)
pO2, Arterial: 88 mmHg (ref 83.0–108.0)
pO2, Arterial: 128 mmHg — ABNORMAL HIGH (ref 83.0–108.0)

## 2019-05-11 LAB — POC SARS CORONAVIRUS 2 AG: SARS Coronavirus 2 Ag: NEGATIVE

## 2019-05-11 LAB — PROTIME-INR
INR: 1.2 (ref 0.8–1.2)
Prothrombin Time: 15.5 seconds — ABNORMAL HIGH (ref 11.4–15.2)

## 2019-05-11 LAB — BLOOD CULTURE ID PANEL (REFLEXED)

## 2019-05-11 LAB — GLUCOSE, CAPILLARY
Glucose-Capillary: 175 mg/dL — ABNORMAL HIGH (ref 70–99)
Glucose-Capillary: 161 mg/dL — ABNORMAL HIGH (ref 70–99)

## 2019-05-11 LAB — BLOOD GAS, VENOUS
Acid-base deficit: 3.9 mmol/L — ABNORMAL HIGH (ref 0.0–2.0)
Acid-base deficit: 7.8 mmol/L — ABNORMAL HIGH (ref 0.0–2.0)
Bicarbonate: 20.8 mmol/L (ref 20.0–28.0)
Bicarbonate: 17.6 mmol/L — ABNORMAL LOW (ref 20.0–28.0)
O2 Saturation: 18.1 %
O2 Saturation: 73.3 %
Patient temperature: 37
Patient temperature: 37
pCO2, Ven: 36 mmHg — ABNORMAL LOW (ref 44.0–60.0)
pCO2, Ven: 35 mmHg — ABNORMAL LOW (ref 44.0–60.0)
pH, Ven: 7.37 (ref 7.250–7.430)
pH, Ven: 7.31 (ref 7.250–7.430)
pO2, Ven: <31 mmHg — CL (ref 32.0–45.0)
pO2, Ven: 43 mmHg (ref 32.0–45.0)

## 2019-05-11 LAB — COMPREHENSIVE METABOLIC PANEL
ALT: 43 U/L (ref 0–44)
AST: 51 U/L — ABNORMAL HIGH (ref 15–41)
Albumin: 2.6 g/dL — ABNORMAL LOW (ref 3.5–5.0)
Alkaline Phosphatase: 80 U/L (ref 38–126)
Anion gap: 15 (ref 5–15)
BUN: 47 mg/dL — ABNORMAL HIGH (ref 8–23)
CO2: 15 mmol/L — ABNORMAL LOW (ref 22–32)
Calcium: 8.3 mg/dL — ABNORMAL LOW (ref 8.9–10.3)
Chloride: 103 mmol/L (ref 98–111)
Creatinine, Ser: 3.47 mg/dL — ABNORMAL HIGH (ref 0.61–1.24)
GFR calc Af Amer: 20 mL/min — ABNORMAL LOW (ref >60)
GFR calc non Af Amer: 18 mL/min — ABNORMAL LOW (ref >60)
Glucose, Bld: 152 mg/dL — ABNORMAL HIGH (ref 70–99)
Potassium: 3.9 mmol/L (ref 3.5–5.1)
Sodium: 133 mmol/L — ABNORMAL LOW (ref 135–145)
Total Bilirubin: 0.8 mg/dL (ref 0.3–1.2)
Total Protein: 6.2 g/dL — ABNORMAL LOW (ref 6.5–8.1)

## 2019-05-11 LAB — URINALYSIS, ROUTINE W REFLEX MICROSCOPIC
Bacteria, UA: NONE SEEN
Bilirubin Urine: NEGATIVE
Glucose, UA: NEGATIVE mg/dL
Ketones, ur: NEGATIVE mg/dL
Nitrite: NEGATIVE
Protein, ur: 100 mg/dL — AB
Specific Gravity, Urine: 1.012 (ref 1.005–1.030)
pH: 5 (ref 5.0–8.0)

## 2019-05-11 LAB — PROCALCITONIN: Procalcitonin: 42.88 ng/mL

## 2019-05-11 LAB — RESPIRATORY PANEL BY RT PCR (FLU A&B, COVID)
Influenza A by PCR: NEGATIVE
Influenza B by PCR: NEGATIVE
SARS Coronavirus 2 by RT PCR: NEGATIVE

## 2019-05-11 LAB — CBC
HCT: 33.7 % — ABNORMAL LOW (ref 39.0–52.0)
HCT: 34.3 % — ABNORMAL LOW (ref 39.0–52.0)
Hemoglobin: 11.5 g/dL — ABNORMAL LOW (ref 13.0–17.0)
Hemoglobin: 11.5 g/dL — ABNORMAL LOW (ref 13.0–17.0)
MCH: 29.4 pg (ref 26.0–34.0)
MCH: 29.6 pg (ref 26.0–34.0)
MCHC: 34.1 g/dL (ref 30.0–36.0)
MCHC: 33.5 g/dL (ref 30.0–36.0)
MCV: 86.2 fL (ref 80.0–100.0)
MCV: 88.4 fL (ref 80.0–100.0)
Platelets: 84 10*3/uL — ABNORMAL LOW (ref 150–400)
Platelets: 79 10*3/uL — ABNORMAL LOW (ref 150–400)
RBC: 3.91 MIL/uL — ABNORMAL LOW (ref 4.22–5.81)
RBC: 3.88 MIL/uL — ABNORMAL LOW (ref 4.22–5.81)
RDW: 14.1 % (ref 11.5–15.5)
RDW: 14.1 % (ref 11.5–15.5)
WBC: 13.9 10*3/uL — ABNORMAL HIGH (ref 4.0–10.5)
WBC: 13 10*3/uL — ABNORMAL HIGH (ref 4.0–10.5)
nRBC: 0 % (ref 0.0–0.2)
nRBC: 0 % (ref 0.0–0.2)

## 2019-05-11 LAB — TRIGLYCERIDES: Triglycerides: 234 mg/dL — ABNORMAL HIGH (ref <150)

## 2019-05-11 LAB — LACTIC ACID, PLASMA
Lactic Acid, Venous: 3.6 mmol/L (ref 0.5–1.9)
Lactic Acid, Venous: 1.7 mmol/L (ref 0.5–1.9)
Lactic Acid, Venous: 1.2 mmol/L (ref 0.5–1.9)

## 2019-05-11 LAB — CREATININE, SERUM
Creatinine, Ser: 2.99 mg/dL — ABNORMAL HIGH (ref 0.61–1.24)
GFR calc Af Amer: 24 mL/min — ABNORMAL LOW (ref >60)
GFR calc non Af Amer: 21 mL/min — ABNORMAL LOW (ref >60)

## 2019-05-11 LAB — MRSA PCR SCREENING: MRSA by PCR: POSITIVE — AB

## 2019-05-11 SURGERY — CYSTOURETEROSCOPY, WITH STENT INSERTION
Anesthesia: General | Laterality: Left

## 2019-05-11 MED ORDER — ORAL CARE MOUTH RINSE
15.0000 mL | OROMUCOSAL | Status: DC
  Administered 2019-05-11 – 2019-05-14 (×26): 15 mL via OROMUCOSAL

## 2019-05-11 MED ORDER — CHLORHEXIDINE GLUCONATE CLOTH 2 % EX PADS
6.0000 | MEDICATED_PAD | Freq: Every day | CUTANEOUS | Status: DC
  Administered 2019-05-12 – 2019-05-24 (×11): 6 via TOPICAL

## 2019-05-11 MED ORDER — IOHEXOL 180 MG/ML  SOLN
INTRAMUSCULAR | Status: DC | PRN
  Administered 2019-05-11: 20 mL

## 2019-05-11 MED ORDER — LACTATED RINGERS IV SOLN
INTRAVENOUS | Status: DC
  Administered 2019-05-11 – 2019-05-14 (×4): via INTRAVENOUS

## 2019-05-11 MED ORDER — SODIUM CHLORIDE 0.9 % IV BOLUS (SEPSIS)
1000.0000 mL | Freq: Once | INTRAVENOUS | Status: AC
  Administered 2019-05-11: 1000 mL via INTRAVENOUS

## 2019-05-11 MED ORDER — MIDAZOLAM HCL 2 MG/2ML IJ SOLN
INTRAMUSCULAR | Status: DC | PRN
  Administered 2019-05-11 (×2): 2 mg via INTRAVENOUS

## 2019-05-11 MED ORDER — FENTANYL 2500MCG IN NS 250ML (10MCG/ML) PREMIX INFUSION
INTRAVENOUS | Status: AC
  Administered 2019-05-11: 17:00:00 50 ug/h via INTRAVENOUS
  Filled 2019-05-11: qty 250

## 2019-05-11 MED ORDER — IPRATROPIUM-ALBUTEROL 0.5-2.5 (3) MG/3ML IN SOLN
3.0000 mL | Freq: Four times a day (QID) | RESPIRATORY_TRACT | Status: DC
  Administered 2019-05-11 – 2019-05-16 (×19): 3 mL via RESPIRATORY_TRACT
  Filled 2019-05-11 (×18): qty 3

## 2019-05-11 MED ORDER — FENTANYL CITRATE (PF) 100 MCG/2ML IJ SOLN
INTRAMUSCULAR | Status: AC
  Filled 2019-05-11: qty 2

## 2019-05-11 MED ORDER — IPRATROPIUM-ALBUTEROL 0.5-2.5 (3) MG/3ML IN SOLN
RESPIRATORY_TRACT | Status: AC
  Administered 2019-05-11: 13:00:00 3 mL
  Filled 2019-05-11: qty 6

## 2019-05-11 MED ORDER — PANTOPRAZOLE SODIUM 40 MG IV SOLR
40.0000 mg | Freq: Every day | INTRAVENOUS | Status: DC
  Administered 2019-05-11 – 2019-05-18 (×8): 40 mg via INTRAVENOUS
  Filled 2019-05-11 (×8): qty 40

## 2019-05-11 MED ORDER — SODIUM CHLORIDE 0.9 % IV SOLN
INTRAVENOUS | Status: DC | PRN
  Administered 2019-05-11: 15:00:00 via INTRAVENOUS

## 2019-05-11 MED ORDER — LIDOCAINE HCL (CARDIAC) PF 100 MG/5ML IV SOSY
PREFILLED_SYRINGE | INTRAVENOUS | Status: DC | PRN
  Administered 2019-05-11: 50 mg via INTRAVENOUS

## 2019-05-11 MED ORDER — IPRATROPIUM-ALBUTEROL 0.5-2.5 (3) MG/3ML IN SOLN
3.0000 mL | Freq: Once | RESPIRATORY_TRACT | Status: AC
  Administered 2019-05-11: 3 mL via RESPIRATORY_TRACT
  Filled 2019-05-11: qty 3

## 2019-05-11 MED ORDER — PROPOFOL 1000 MG/100ML IV EMUL
5.0000 ug/kg/min | INTRAVENOUS | Status: DC
  Administered 2019-05-11: 5 ug/kg/min via INTRAVENOUS
  Administered 2019-05-12: 70 ug/kg/min via INTRAVENOUS
  Administered 2019-05-12: 20 ug/kg/min via INTRAVENOUS
  Administered 2019-05-12 (×2): 50 ug/kg/min via INTRAVENOUS
  Administered 2019-05-12 – 2019-05-13 (×2): 40 ug/kg/min via INTRAVENOUS
  Filled 2019-05-11 (×8): qty 100

## 2019-05-11 MED ORDER — SODIUM CHLORIDE 0.9 % IV SOLN
1.0000 g | Freq: Once | INTRAVENOUS | Status: AC
  Administered 2019-05-11: 1 g via INTRAVENOUS
  Filled 2019-05-11: qty 1

## 2019-05-11 MED ORDER — FENTANYL 2500MCG IN NS 250ML (10MCG/ML) PREMIX INFUSION
0.0000 ug/h | INTRAVENOUS | Status: DC
  Administered 2019-05-13: 08:00:00 50 ug/h via INTRAVENOUS
  Filled 2019-05-11: qty 250

## 2019-05-11 MED ORDER — CHLORHEXIDINE GLUCONATE 0.12% ORAL RINSE (MEDLINE KIT)
15.0000 mL | Freq: Two times a day (BID) | OROMUCOSAL | Status: DC
  Administered 2019-05-11 – 2019-05-14 (×6): 15 mL via OROMUCOSAL

## 2019-05-11 MED ORDER — SODIUM CHLORIDE 0.9 % IV BOLUS (SEPSIS): 1000.0000 mL | Freq: Once | INTRAVENOUS | Status: DC

## 2019-05-11 MED ORDER — VANCOMYCIN VARIABLE DOSE PER UNSTABLE RENAL FUNCTION (PHARMACIST DOSING): Status: DC

## 2019-05-11 MED ORDER — SODIUM CHLORIDE FLUSH 0.9 % IV SOLN
INTRAVENOUS | Status: AC
  Filled 2019-05-11: qty 3

## 2019-05-11 MED ORDER — STERILE WATER FOR INJECTION IV SOLN
INTRAVENOUS | Status: DC
  Administered 2019-05-11 – 2019-05-12 (×4): via INTRAVENOUS
  Filled 2019-05-11 (×8): qty 850

## 2019-05-11 MED ORDER — VASOPRESSIN 20 UNIT/ML IV SOLN
INTRAVENOUS | Status: DC | PRN
  Administered 2019-05-11 (×2): 2 [IU] via INTRAVENOUS

## 2019-05-11 MED ORDER — ONDANSETRON HCL 4 MG/2ML IJ SOLN
4.0000 mg | Freq: Once | INTRAMUSCULAR | Status: AC
  Administered 2019-05-11: 4 mg via INTRAVENOUS
  Filled 2019-05-11: qty 2

## 2019-05-11 MED ORDER — PROPOFOL 10 MG/ML IV BOLUS
INTRAVENOUS | Status: DC | PRN
  Administered 2019-05-11: 100 mg via INTRAVENOUS

## 2019-05-11 MED ORDER — SODIUM CHLORIDE 0.9 % IV SOLN
500.0000 mg | Freq: Two times a day (BID) | INTRAVENOUS | Status: DC
  Administered 2019-05-12 (×2): 500 mg via INTRAVENOUS
  Filled 2019-05-11: qty 500
  Filled 2019-05-11 (×3): qty 0.5

## 2019-05-11 MED ORDER — FENTANYL CITRATE (PF) 100 MCG/2ML IJ SOLN: 25.0000 ug | INTRAMUSCULAR | Status: DC | PRN

## 2019-05-11 MED ORDER — FENTANYL CITRATE (PF) 100 MCG/2ML IJ SOLN
50.0000 ug | Freq: Once | INTRAMUSCULAR | Status: AC
  Administered 2019-05-11: 13:00:00 50 ug via INTRAVENOUS
  Filled 2019-05-11: qty 2

## 2019-05-11 MED ORDER — SODIUM CHLORIDE 0.9 % IV BOLUS
500.0000 mL | Freq: Once | INTRAVENOUS | Status: AC
  Administered 2019-05-11: 13:00:00 500 mL via INTRAVENOUS

## 2019-05-11 MED ORDER — PHENYLEPHRINE HCL (PRESSORS) 10 MG/ML IV SOLN
INTRAVENOUS | Status: DC | PRN
  Administered 2019-05-11 (×4): 200 ug via INTRAVENOUS

## 2019-05-11 MED ORDER — MIDAZOLAM HCL 2 MG/2ML IJ SOLN
INTRAMUSCULAR | Status: AC
  Filled 2019-05-11: qty 2

## 2019-05-11 MED ORDER — ACETAMINOPHEN 500 MG PO TABS
1000.0000 mg | ORAL_TABLET | Freq: Once | ORAL | Status: AC
  Administered 2019-05-11: 1000 mg via ORAL
  Filled 2019-05-11: qty 2

## 2019-05-11 MED ORDER — METHYLPREDNISOLONE SODIUM SUCC 125 MG IJ SOLR
125.0000 mg | Freq: Once | INTRAMUSCULAR | Status: AC
  Administered 2019-05-11: 125 mg via INTRAVENOUS
  Filled 2019-05-11: qty 2

## 2019-05-11 MED ORDER — VANCOMYCIN HCL 10 G IV SOLR
1750.0000 mg | Freq: Once | INTRAVENOUS | Status: AC
  Administered 2019-05-12: 1750 mg via INTRAVENOUS
  Filled 2019-05-11: qty 1750

## 2019-05-11 MED ORDER — VASOPRESSIN 20 UNIT/ML IV SOLN
INTRAVENOUS | Status: AC
  Filled 2019-05-11: qty 1

## 2019-05-11 MED ORDER — PROPOFOL 10 MG/ML IV BOLUS
INTRAVENOUS | Status: AC
  Filled 2019-05-11: qty 20

## 2019-05-11 MED ORDER — ONDANSETRON HCL 4 MG/2ML IJ SOLN: 4.0000 mg | Freq: Four times a day (QID) | INTRAMUSCULAR | Status: DC | PRN

## 2019-05-11 MED ORDER — SUCCINYLCHOLINE CHLORIDE 20 MG/ML IJ SOLN
INTRAMUSCULAR | Status: DC | PRN
  Administered 2019-05-11: 100 mg via INTRAVENOUS

## 2019-05-11 MED ORDER — ROCURONIUM BROMIDE 100 MG/10ML IV SOLN
INTRAVENOUS | Status: DC | PRN
  Administered 2019-05-11: 40 mg via INTRAVENOUS

## 2019-05-11 MED ORDER — FENTANYL CITRATE (PF) 100 MCG/2ML IJ SOLN
INTRAMUSCULAR | Status: DC | PRN
  Administered 2019-05-11 (×2): 50 ug via INTRAVENOUS

## 2019-05-11 MED ORDER — ONDANSETRON HCL 4 MG/2ML IJ SOLN
INTRAMUSCULAR | Status: DC | PRN
  Administered 2019-05-11: 4 mg via INTRAVENOUS

## 2019-05-11 MED ORDER — ACETAMINOPHEN 325 MG PO TABS
650.0000 mg | ORAL_TABLET | ORAL | Status: DC | PRN
  Administered 2019-05-20 – 2019-05-21 (×2): 650 mg via ORAL
  Filled 2019-05-11 (×2): qty 2

## 2019-05-11 MED ORDER — HEPARIN SODIUM (PORCINE) 5000 UNIT/ML IJ SOLN
5000.0000 [IU] | Freq: Three times a day (TID) | INTRAMUSCULAR | Status: DC
  Administered 2019-05-12 – 2019-05-19 (×23): 5000 [IU] via SUBCUTANEOUS
  Filled 2019-05-11 (×22): qty 1

## 2019-05-11 SURGICAL SUPPLY — 19 items
BAG DRAIN CYSTO-URO LG1000N (MISCELLANEOUS) ×3 IMPLANT
BRUSH SCRUB EZ 1% IODOPHOR (MISCELLANEOUS) ×3 IMPLANT
CATH URETL 5X70 OPEN END (CATHETERS) ×3 IMPLANT
GLOVE BIO SURGEON STRL SZ 6.5 (GLOVE) ×2 IMPLANT
GLOVE BIO SURGEONS STRL SZ 6.5 (GLOVE) ×1
GOWN STRL REUS W/ TWL LRG LVL3 (GOWN DISPOSABLE) ×2 IMPLANT
GOWN STRL REUS W/TWL LRG LVL3 (GOWN DISPOSABLE) ×4
GUIDEWIRE STR DUAL SENSOR (WIRE) ×3 IMPLANT
KIT TURNOVER CYSTO (KITS) ×3 IMPLANT
PACK CYSTO AR (MISCELLANEOUS) ×3 IMPLANT
SET CYSTO W/LG BORE CLAMP LF (SET/KITS/TRAYS/PACK) ×3 IMPLANT
SOL .9 NS 3000ML IRR  AL (IV SOLUTION) ×2
SOL .9 NS 3000ML IRR UROMATIC (IV SOLUTION) ×1 IMPLANT
STENT URET 6FRX24 CONTOUR (STENTS) IMPLANT
STENT URET 6FRX26 CONTOUR (STENTS) ×3 IMPLANT
SURGILUBE 2OZ TUBE FLIPTOP (MISCELLANEOUS) ×3 IMPLANT
SYRINGE IRR TOOMEY STRL 70CC (SYRINGE) ×3 IMPLANT
TRAY FOLEY SLVR 14FR TEMP STAT (SET/KITS/TRAYS/PACK) ×3 IMPLANT
WATER STERILE IRR 1000ML POUR (IV SOLUTION) ×3 IMPLANT

## 2019-05-11 NOTE — Anesthesia Post-op Follow-up Note (Signed)
Anesthesia QCDR form completed.        

## 2019-05-11 NOTE — Anesthesia Procedure Notes (Signed)
Procedure Name: Intubation Performed by: Rolla Plate, CRNA Pre-anesthesia Checklist: Patient identified, Patient being monitored, Timeout performed, Emergency Drugs available and Suction available Patient Re-evaluated:Patient Re-evaluated prior to induction Oxygen Delivery Method: Circle system utilized Preoxygenation: Pre-oxygenation with 100% oxygen Induction Type: IV induction and Rapid sequence Laryngoscope Size: McGraph and 4 Grade View: Grade I Tube type: Oral Tube size: 8.0 mm Number of attempts: 1 Airway Equipment and Method: Stylet and Video-laryngoscopy Placement Confirmation: ETT inserted through vocal cords under direct vision,  positive ETCO2 and breath sounds checked- equal and bilateral Secured at: 23 cm Tube secured with: Tape Dental Injury: Teeth and Oropharynx as per pre-operative assessment

## 2019-05-11 NOTE — H&P (Signed)
Alan Small is an 63 y.o. male.   Chief Complaint: Severe sepsis, acute on chronic renal failure, post stenting for renal calculus HPI: This is a 63 year old current smoker with a solitary kidney (LEFT) and a history as noted below, who apparently is followed at [facility] urology.  History is obtained from available records as the patient cannot provide history due to being intubated and on a ventilator.  He is post placement of a ureteral stent for a partially obstructing ureteral calculus.  The patient was having symptoms of fatigue, shortness of breath and flank pain on presentation according to the records.  The patient was noted to be tachypneic and tachycardic and obviously appeared septic.  He was getting meropenem through a right IJ Port-A-Cath as an outpatient.  He has a history of ESBL in the past.    He was initially placed on BiPAP in the emergency room but failed this and required intubation.  Postoperatively he remains on the ventilator and we are asked to admit the patient.  Past Medical History:  Diagnosis Date  . Asthma   . COPD (chronic obstructive pulmonary disease) ([place])   . Depression   . External hemorrhoids   . Gastric ulcer   . GERD (gastroesophageal reflux disease)   . Hydronephrosis   . Hypothyroidism    hypo  . Insomnia   . Mental disorder   . Myocardial infarction ([place])   . Paranoid schizophrenia ([place])   . Renal disorder    renal failure  . Seizures ([place])   . Stroke [facility])     Past Surgical History:  Procedure Laterality Date  . APPENDECTOMY    . COLONOSCOPY WITH PROPOFOL N/A 12/07/2014   Procedure: COLONOSCOPY WITH PROPOFOL;  Surgeon: Manya Silvas, MD;  Location: [facility] ENDOSCOPY;  Service: Endoscopy;  Laterality: N/A;  . COLONOSCOPY WITH PROPOFOL N/A 10/10/2016   Procedure: COLONOSCOPY WITH PROPOFOL;  Surgeon: Lucilla Lame, MD;  Location: [place];  Service: Endoscopy;  Laterality: N/A;  . CYSTOSCOPY W/ URETERAL STENT PLACEMENT Left 01/21/2019    Procedure: CYSTOSCOPY WITH RETROGRADE PYELOGRAM/URETERAL STENT PLACEMENT;  Surgeon: Abbie Sons, MD;  Location: [facility] ORS;  Service: Urology;  Laterality: Left;  . ESOPHAGOGASTRODUODENOSCOPY N/A 12/07/2014   Procedure: ESOPHAGOGASTRODUODENOSCOPY (EGD);  Surgeon: Manya Silvas, MD;  Location: [facility] ENDOSCOPY;  Service: Endoscopy;  Laterality: N/A;  . NEPHRECTOMY    . POLYPECTOMY  10/10/2016   Procedure: POLYPECTOMY;  Surgeon: Lucilla Lame, MD;  Location: [place];  Service: Endoscopy;;  . SUPRAPUBIC CATHETER INSERTION      Family History  Problem Relation Age of Onset  . Stroke Father   . Pneumonia Father   . Brain cancer Mother    Social History:  reports that he has been smoking cigarettes. He has a 55.00 pack-year smoking history. He has quit using smokeless tobacco.  His smokeless tobacco use included chew. He reports that he does not drink alcohol or use drugs.  This is recorded history as the patient cannot report currently.  Allergies:  Allergies  Allergen Reactions  . Mellaril [Thioridazine] Shortness Of Breath and Swelling  . Buprenorphine Itching  . Morphine And Related Itching  . Navane [Thiothixene] Other (See Comments)    Reaction:  GI upset   . Thorazine [Chlorpromazine] Other (See Comments)    Reaction:  Dizziness/fainting     Medications Prior to Admission  Medication Sig Dispense Refill  . acetaminophen (TYLENOL) 500 MG tablet Take 500 mg by mouth every 8 (eight) hours  as needed for mild pain, moderate pain or fever.     [name] albuterol-ipratropium (COMBIVENT) 18-103 MCG/ACT inhaler Inhale 1 puff into the lungs every 6 (six) hours as needed for wheezing or shortness of breath.     . budesonide-formoterol (SYMBICORT) 160-4.5 MCG/ACT inhaler Inhale 1 puff into the lungs 2 (two) times daily.     . diphenhydrAMINE (BENADRYL ALLERGY) 25 MG tablet Take 25 mg by mouth every 6 (six) hours as needed.    . docusate sodium (COLACE) 100 MG capsule Take 100 mg by mouth  2 (two) times daily.    [name] FLUoxetine (PROZAC) 20 MG capsule Take 60 mg by mouth every morning.    Lorayne Bender SUSTENNA 156 MG/ML SUSY injection Inject 156 mg into the muscle every 30 (thirty) days.    [name] levothyroxine (SYNTHROID, LEVOTHROID) 50 MCG tablet Take 50 mcg by mouth daily before breakfast.    . lidocaine (LIDODERM) 5 % Place 1 patch onto the skin daily as needed. Remove & Discard patch within 12 hours or as directed by MD    . nystatin cream (MYCOSTATIN) Apply 1 application topically 2 (two) times daily.    [name] omeprazole (PRILOSEC) 20 MG capsule Take 20 mg by mouth daily.    [name] oxybutynin (DITROPAN) 5 MG tablet Take 5 mg by mouth 3 (three) times daily.    . sodium bicarbonate 650 MG tablet Take 650 mg by mouth daily.     . tamsulosin (FLOMAX) 0.4 MG CAPS capsule Take 0.4 mg by mouth every morning.       Results for orders placed or performed during the hospital encounter of 05/11/19 (from the past 48 hour(s))  CBC     Status: Abnormal   Collection Time: 05/11/19 10:47 AM  Result Value Ref Range   WBC 13.9 (H) 4.0 - 10.5 K/uL   RBC 3.91 (L) 4.22 - 5.81 MIL/uL   Hemoglobin 11.5 (L) 13.0 - 17.0 g/dL   HCT 33.7 (L) 39.0 - 52.0 %   MCV 86.2 80.0 - 100.0 fL   MCH 29.4 26.0 - 34.0 pg   MCHC 34.1 30.0 - 36.0 g/dL   RDW 14.1 11.5 - 15.5 %   Platelets 84 (L) 150 - 400 K/uL    Comment: Immature Platelet Fraction may be clinically indicated, consider ordering this additional test [id number]    nRBC 0.0 0.0 - 0.2 %    Comment: Performed at [facility], [address] [place], [place] [address]  Comprehensive metabolic panel     Status: Abnormal   Collection Time: 05/11/19 10:47 AM  Result Value Ref Range   Sodium 133 (L) 135 - 145 mmol/L   Potassium 3.9 3.5 - 5.1 mmol/L   Chloride 103 98 - 111 mmol/L   CO2 15 (L) 22 - 32 mmol/L   Glucose, Bld 152 (H) 70 - 99 mg/dL   BUN 47 (H) 8 - 23 mg/dL   Creatinine, Ser 3.47 (H) 0.61 - 1.24 mg/dL   Calcium 8.3 (L) 8.9 - 10.3 mg/dL    Total Protein 6.2 (L) 6.5 - 8.1 g/dL   Albumin 2.6 (L) 3.5 - 5.0 g/dL   AST 51 (H) 15 - 41 U/L   ALT 43 0 - 44 U/L   Alkaline Phosphatase 80 38 - 126 U/L   Total Bilirubin 0.8 0.3 - 1.2 mg/dL   GFR calc non Af Amer 18 (L) >60 mL/min   GFR calc Af Amer 20 (L) >60 mL/min   Anion gap 15  5 - 15    Comment: Performed at [facility], [place], [place] [address]  Lactic acid, plasma     Status: Abnormal   Collection Time: 05/11/19 10:47 AM  Result Value Ref Range   Lactic Acid, Venous 3.6 (HH) 0.5 - 1.9 mmol/L    Comment: CRITICAL RESULT CALLED TO, READ BACK BY AND VERIFIED WITH  [place] PIERCE AT 1203 05/11/2019 SDR Performed at [facility], [address], [place], [place] [address]   Protime-INR     Status: Abnormal   Collection Time: 05/11/19 10:47 AM  Result Value Ref Range   Prothrombin Time 15.5 (H) 11.4 - 15.2 seconds   INR 1.2 0.8 - 1.2    Comment: (NOTE) INR goal varies based on device and disease states. Performed at [facility], [address] [place], [place] [address]   Blood gas, venous     Status: Abnormal   Collection Time: 05/11/19 11:06 AM  Result Value Ref Range   pH, Ven 7.37 7.250 - 7.430   pCO2, Ven 36 (L) 44.0 - 60.0 mmHg   pO2, Ven <31.0 (LL) 32.0 - 45.0 mmHg    Comment: CRITICAL RESULT CALLED TO, READ BACK BY AND VERIFIED WITH: Levada Dy, RN [phone] ON 05/11/19 [facility],RRT    Bicarbonate 20.8 20.0 - 28.0 mmol/L   Acid-base deficit 3.9 (H) 0.0 - 2.0 mmol/L   O2 Saturation 18.1 %   Patient temperature 37.0    Collection site VEIN    Sample type VENOUS     Comment: Performed at [facility], [address] [place], [place] [address]  POC SARS Coronavirus 2 Ag     Status: None   Collection Time: 05/11/19 12:13 PM  Result Value Ref Range   SARS Coronavirus 2 Ag NEGATIVE NEGATIVE    Comment: (NOTE) SARS-CoV-2 antigen NOT DETECTED.  Negative results are presumptive.  Negative results do not  preclude SARS-CoV-2 infection and should not be used as the sole basis for treatment or other patient management decisions, including infection  control decisions, particularly in the presence of clinical signs and  symptoms consistent with COVID-19, or in those who have been in contact with the virus.  Negative results must be combined with clinical observations, patient history, and epidemiological information. The expected result is Negative. Fact Sheet for Patients: PodPark.tn Fact Sheet for Healthcare Providers: GiftContent.is This test is not yet approved or cleared by the [place] FDA and  has been authorized for detection and/or diagnosis of SARS-CoV-2 by FDA under an Emergency Use Authorization (EUA).  This EUA will remain in effect (meaning this test can be used) for the duration of  the COVID-19 de claration under Section 564(b)(1) of the Act, 21 U.S.C. section 360bbb-3(b)(1), unless the authorization is terminated or revoked sooner.   Lactic acid, plasma     Status: None   Collection Time: 05/11/19 12:46 PM  Result Value Ref Range   Lactic Acid, Venous 1.7 0.5 - 1.9 mmol/L    Comment: Performed at [facility], [address] [place], [place] [address]  Respiratory Panel by RT PCR (Flu A&B, Covid) - Nasopharyngeal Swab     Status: None   Collection Time: 05/11/19 12:46 PM   Specimen: Nasopharyngeal Swab  Result Value Ref Range   SARS Coronavirus 2 by RT PCR NEGATIVE NEGATIVE    Comment: (NOTE) SARS-CoV-2 target nucleic acids are NOT DETECTED. The SARS-CoV-2 RNA is generally detectable in upper respiratoy specimens during the acute  phase of infection. The lowest concentration of SARS-CoV-2 viral copies this assay can detect is 131 copies/mL. A negative result does not preclude SARS-Cov-2 infection and should not be used as the sole basis for treatment or other patient management decisions. A  negative result may occur with  improper specimen collection/handling, submission of specimen other than nasopharyngeal swab, presence of viral mutation(s) within the areas targeted by this assay, and inadequate number of viral copies (<131 copies/mL). A negative result must be combined with clinical observations, patient history, and epidemiological information. The expected result is Negative. Fact Sheet for Patients:  PinkCheek.be Fact Sheet for Healthcare Providers:  GravelBags.it This test is not yet ap proved or cleared by the [place] FDA and  has been authorized for detection and/or diagnosis of SARS-CoV-2 by FDA under an Emergency Use Authorization (EUA). This EUA will remain  in effect (meaning this test can be used) for the duration of the COVID-19 declaration under Section 564(b)(1) of the Act, 21 U.S.C. section 360bbb-3(b)(1), unless the authorization is terminated or revoked sooner.    Influenza A by PCR NEGATIVE NEGATIVE   Influenza B by PCR NEGATIVE NEGATIVE    Comment: (NOTE) The Xpert Xpress SARS-CoV-2/FLU/RSV assay is intended as an aid in  the diagnosis of influenza from Nasopharyngeal swab specimens and  should not be used as a sole basis for treatment. Nasal washings and  aspirates are unacceptable for Xpert Xpress SARS-CoV-2/FLU/RSV  testing. Fact Sheet for Patients: PinkCheek.be Fact Sheet for Healthcare Providers: GravelBags.it This test is not yet approved or cleared by the [place] FDA and  has been authorized for detection and/or diagnosis of SARS-CoV-2 by  FDA under an Emergency Use Authorization (EUA). This EUA will remain  in effect (meaning this test can be used) for the duration of the  Covid-19 declaration under Section 564(b)(1) of the Act, 21  U.S.C. section 360bbb-3(b)(1), unless the authorization is  terminated or  revoked. Performed at [facility], [address] [place], [place] [address]   Blood gas, venous     Status: Abnormal   Collection Time: 05/11/19  2:09 PM  Result Value Ref Range   pH, Ven 7.31 7.250 - 7.430   pCO2, Ven 35 (L) 44.0 - 60.0 mmHg   pO2, Ven 43.0 32.0 - 45.0 mmHg   Bicarbonate 17.6 (L) 20.0 - 28.0 mmol/L   Acid-base deficit 7.8 (H) 0.0 - 2.0 mmol/L   O2 Saturation 73.3 %   Patient temperature 37.0    Collection site VEIN    Sample type VENOUS     Comment: Performed at [facility], [address], [place], [place] [address]   Dg Chest Port 1 View  Result Date: 05/11/2019 CLINICAL DATA:  Dyspnea. EXAM: PORTABLE CHEST 1 VIEW COMPARISON:  01/22/2019 FINDINGS: Right IJ Port-A-Cath with tip over the SVC. Lungs are hypoinflated with mild prominence of the central pulmonary vessels suggesting mild vascular congestion. No lobar consolidation or effusion. There is subtle patchy density in the mid lungs as could not exclude possible underlying infection. No pneumothorax. Cardiomediastinal silhouette and remainder of the exam is unchanged. IMPRESSION: Findings suggesting mild vascular congestion. Subtle patchy density in the mid lungs which could represent underlying infection. Right IJ Port-A-Cath with tip over the SVC. Electronically Signed   By: Marin Olp M.D.   On: 05/11/2019 11:10   Dg Or Urology Cysto Image ([facility] Only)  Result Date: 05/11/2019 There is no interpretation for this exam.  This order is for images  obtained during a surgical procedure.  Please See "Surgeries" Tab for more information regarding the procedure.   Ct Renal Stone Study  Result Date: 05/11/2019 CLINICAL DATA:  Flank pain. Recurrent disease suspected. EXAM: CT ABDOMEN AND PELVIS WITHOUT CONTRAST TECHNIQUE: Multidetector CT imaging of the abdomen and pelvis was performed following the standard protocol without IV contrast. COMPARISON:  CT of the abdomen and pelvis without contrast  3/20/. FINDINGS: Lower chest: Patchy airspace opacities are present in the lower lobes bilaterally. Heart size is normal. No significant pleural or pericardial effusion present. Hepatobiliary: Hepatic cysts are stable. No new lesions are present. The largest lesion laterally in inferior right lower lobe measuring 18 mm. The common bile duct and gallbladder are normal. Pancreas: Unremarkable. No pancreatic ductal dilatation or surrounding inflammatory changes. Spleen: Normal in size without focal abnormality. Adrenals/Urinary Tract: Adrenal glands are normal bilaterally. Right nephrectomy noted. Prominent staghorn calculus seen on the prior study is near completely resolved. Prominent parenchymal calcification in the midportion of the left kidney measures 11 mm. Punctate calcification near the lower pole of the left kidney is stable. Mild prominence of the left ureter present above the level of a partially obstructing 5 mm stone at pelvic inlet. More distal ureter is normal. The urinary bladder is within limits. Stomach/Bowel: Stomach and duodenum are within normal limits. Bowel is unremarkable. Terminal ileum is normal. The ascending and transverse colon are within normal limits. Descending and sigmoid colon are normal. Vascular/Lymphatic: Atherosclerotic calcifications are present in the aorta and branch vessels without aneurysm. Reproductive: Dense calcifications are present prostate gland. No discrete lesion is present. Other: Choose 1 Musculoskeletal: No acute or significant osseous findings. IMPRESSION: 1. Partially obstructing 5 mm stone at the left pelvic inlet. 2. Mild prominence of the left ureter above the level the stone. 3. Marked reduction in stone burden in the left kidney. 4. Right nephrectomy. 5. Stable hepatic cysts. 6. Aortic Atherosclerosis (ICD10-I70.0). 7. Patchy airspace disease the lung bases bilaterally raises concern for infection. Electronically Signed   By: San Morelle M.D.   On:  05/11/2019 11:52    Review of Systems  Unable to perform ROS: Intubated    Blood pressure 139/73, pulse (!) 124, temperature 97.6 F (36.4 C), resp. rate (!) 21, height 6' (1.829 m), weight 85.7 kg, SpO2 92 %. Physical Exam  Constitutional: He appears well-developed and well-nourished. He is sedated and intubated.  Chronically ill-appearing, appears older than stated age.   HENT:  Head: Normocephalic and atraumatic.  Right Ear: External ear normal.  Left Ear: External ear normal.  Nose: Nose normal.  Orotracheally intubated.  Oral mucosa moist.  Eyes: Pupils are equal, round, and reactive to light. Conjunctivae are normal. No scleral icterus.  Neck: No JVD present. No tracheal deviation present. No thyromegaly present.  Cardiovascular: Normal rate and regular rhythm.  Respiratory: He is intubated. He has no wheezes. He has rhonchi. He has no rales.  Synchronous with the ventilator  GI: Soft. Bowel sounds are normal. He exhibits no distension.  Musculoskeletal:        General: No deformity or edema.     Cervical back: Neck supple.  Neurological:  Sedated no further assessment can be made  Skin: Skin is warm and dry.  Psychiatric:  Cannot make assessment, sedated.     Assessment/Plan Acute respiratory failure due to: COPD exacerbation in the setting of: Severe sepsis Continue ventilator support, discussed with RT Bronchodilator treatments No steroids currently as not bronchospastic and with active infection Pulmonary  toilet Antibiotic therapy SAT/SBT as able   Severe sepsis Obstructing renal calculus Urinary tract infection Known history of ESBL Continue volume resuscitation/support Underwent emergent ureteral stent placement Continue meropenem, broaden/narrow antibiotics per culture results Follow culture results Appropriate isolation for ESBL history   Acute on chronic renal failure Solitary kidney Avoid nephrotoxins Maintain perfusion, avoid  hypotension Volume resuscitate Sodium bicarb infusion Pharmacy to replace electrolytes Dose antibiotics according to renal function  Paranoid schizophrenia This issue adds complexity to his management Resume home medications as soon as feasible  DVT prophylaxis:heparin SQ GI prophylax:PPI  No family available for update.  Critical care time: 75 minutes  C. Derrill Kay, MD [place] [facility] 05/11/2019, 4:41 PM  **This note was dictated using voice recognition software/Dragon.  Despite best efforts to proofread, errors can occur which can change the meaning.  Any change was purely unintentional.

## 2019-05-11 NOTE — ED Notes (Signed)
Report to OR, RN.  PT to OR for emergency surgery for kidney stone.

## 2019-05-11 NOTE — Progress Notes (Signed)
Pharmacy Antibiotic Note  Alan Small is a 63 y.o. male admitted on 05/11/2019. Pharmacy has been consulted for meropenem dosing.  Plan: Meropenem 500 mg q12h  Height: 6' (182.9 cm) Weight: 189 lb (85.7 kg) IBW/kg (Calculated) : 77.6  Temp (24hrs), Avg:98.6 F (37 C), Min:97.6 F (36.4 C), Max:99.1 F (37.3 C)  Recent Labs  Lab 05/11/19 1047 05/11/19 1246 05/11/19 1644  WBC 13.9*  --  13.0*  CREATININE 3.47*  --  2.99*  LATICACIDVEN 3.6* 1.7 1.2    Estimated Creatinine Clearance: 27.4 mL/min (A) (by C-G formula based on SCr of 2.99 mg/dL (H)).    Allergies  Allergen Reactions  . Mellaril [Thioridazine] Shortness Of Breath and Swelling  . Buprenorphine Itching  . Morphine And Related Itching  . Navane [Thiothixene] Other (See Comments)    Reaction:  GI upset   . Thorazine [Chlorpromazine] Other (See Comments)    Reaction:  Dizziness/fainting     Antimicrobials this admission: Meropenem 12/9 >>  Dose adjustments this admission: NA  Microbiology results: 12/9 BCx: pending 12/9 UCx: pending  12/9 MRSA PCR: positive  Thank you for allowing pharmacy to be a part of this patient's care.  Tawnya Crook, PharmD 05/11/2019 8:21 PM

## 2019-05-11 NOTE — Progress Notes (Signed)
Patient transported to OR on Bipap. No complications.

## 2019-05-11 NOTE — Transfer of Care (Signed)
Immediate Anesthesia Transfer of Care Note  Patient: Alan Small  Procedure(s) Performed: CYSTOSCOPY WITH URETEROSCOPY AND STENT PLACEMENT, LEFT (Left )  Patient Location: PACU  Anesthesia Type:General  Level of Consciousness: Patient remains intubated per anesthesia plan  Airway & Oxygen Therapy: Patient remains intubated per anesthesia plan  Post-op Assessment: Report given to RN and Post -op Vital signs reviewed and stable  Post vital signs: Reviewed  Last Vitals:  Vitals Value Taken Time  BP 122/75 05/11/19 1546  Temp 36.4 C 05/11/19 1546  Pulse 120 05/11/19 1547  Resp 20 05/11/19 1547  SpO2 92 % 05/11/19 1547  Vitals shown include unvalidated device data.  Last Pain:  Vitals:   05/11/19 1406  TempSrc:   PainSc: 0-No pain         Complications: No apparent anesthesia complications

## 2019-05-11 NOTE — ED Notes (Signed)
Date and time results received: 05/11/19 1203 (use smartphrase ".now" to insert current time)  Test: lactic acid Critical Value: 3.6  Name of Provider Notified: Isaacs  Orders Received? Or Actions Taken?: Orders Received - See Orders for details

## 2019-05-11 NOTE — Progress Notes (Signed)
PHARMACY - PHYSICIAN COMMUNICATION CRITICAL VALUE ALERT - BLOOD CULTURE IDENTIFICATION (BCID)  Alan Small is an 64 y.o. male who presented to [facility] on 05/11/2019 with a chief complaint of UTI  Assessment:  Lab reports 3 of 4 bottles + for staph, Mec A detected   Name of physician (or Provider) ContactedTana Conch, NP  Current antibiotics: Meropenem  Changes to prescribed antibiotics recommended: Vancomycin added, NP wants to keep meropenem on board Recommendations accepted by provider  Results for orders placed or performed during the hospital encounter of 05/11/19  Blood Culture ID Panel (Reflexed) (Collected: 05/11/2019 11:14 AM)  Result Value Ref Range   Enterococcus species NOT DETECTED NOT DETECTED   Listeria monocytogenes NOT DETECTED NOT DETECTED   Staphylococcus species DETECTED (A) NOT DETECTED   Staphylococcus aureus (BCID) DETECTED (A) NOT DETECTED   Methicillin resistance DETECTED (A) NOT DETECTED   Streptococcus species NOT DETECTED NOT DETECTED   Streptococcus agalactiae NOT DETECTED NOT DETECTED   Streptococcus pneumoniae NOT DETECTED NOT DETECTED   Streptococcus pyogenes NOT DETECTED NOT DETECTED   Acinetobacter baumannii NOT DETECTED NOT DETECTED   Enterobacteriaceae species NOT DETECTED NOT DETECTED   Enterobacter cloacae complex NOT DETECTED NOT DETECTED   Escherichia coli NOT DETECTED NOT DETECTED   Klebsiella oxytoca NOT DETECTED NOT DETECTED   Klebsiella pneumoniae NOT DETECTED NOT DETECTED   Proteus species NOT DETECTED NOT DETECTED   Serratia marcescens NOT DETECTED NOT DETECTED   Haemophilus influenzae NOT DETECTED NOT DETECTED   Neisseria meningitidis NOT DETECTED NOT DETECTED   Pseudomonas aeruginosa NOT DETECTED NOT DETECTED   Candida albicans NOT DETECTED NOT DETECTED   Candida glabrata NOT DETECTED NOT DETECTED   Candida krusei NOT DETECTED NOT DETECTED   Candida parapsilosis NOT DETECTED NOT DETECTED   Candida tropicalis NOT DETECTED NOT  DETECTED    Hart Robinsons A 05/11/2019  11:28 PM

## 2019-05-11 NOTE — Consult Note (Signed)
Urology Consult  I have been asked to see the patient by Dr Myrene Buddy. , for evaluation and management of [place].  Chief Complaint: sepsis, acute on chronic renal insufficiency, solitary kidney, ureteral calculus  History of Present Illness: Alan Small is a 63 y.o. year old with a solitary left kidney managed by [facility] urology status post recent left PCNL followed by ureteroscopy who presents to the emergency room with fatigue, shortness of breath and flank pain.  In the emergency room, he is tachypneic and tachycardic.  Labs indicate an elevated lactate to 3.6, elevated creatinine to 3.47 from his previous baseline of 1.39.  Urine has been unable to be collected due to anuria.  CT scan indicates a partially obstructing 5 mm left ureteral calculus with prominent left ureter but no marked or severe hydronephrosis.  Chest x-ray suggestive of possible underlying infection.  He has been on IV meropenem via a right IJ Port-A-Cath as an outpatient   COVID-19 antigen test is negative, nasal respiratory PCR pending.  Additional history somewhat limited due to the patient's respiratory distress and currently on BiPAP.   Past Medical History:  Diagnosis Date  . Asthma   . COPD (chronic obstructive pulmonary disease) ([place])   . Depression   . External hemorrhoids   . Gastric ulcer   . GERD (gastroesophageal reflux disease)   . Hydronephrosis   . Hypothyroidism    hypo  . Insomnia   . Mental disorder   . Myocardial infarction ([place])   . Paranoid schizophrenia ([place])   . Renal disorder    renal failure  . Seizures ([place])   . Stroke [facility])     Past Surgical History:  Procedure Laterality Date  . APPENDECTOMY    . COLONOSCOPY WITH PROPOFOL N/A 12/07/2014   Procedure: COLONOSCOPY WITH PROPOFOL;  Surgeon: Manya Silvas, MD;  Location: [facility] ENDOSCOPY;  Service: Endoscopy;  Laterality: N/A;  . COLONOSCOPY WITH PROPOFOL N/A 10/10/2016   Procedure: COLONOSCOPY WITH PROPOFOL;  Surgeon: Lucilla Lame, MD;  Location: No Name;  Service: Endoscopy;  Laterality: N/A;  . CYSTOSCOPY W/ URETERAL STENT PLACEMENT Left 01/21/2019   Procedure: CYSTOSCOPY WITH RETROGRADE PYELOGRAM/URETERAL STENT PLACEMENT;  Surgeon: Abbie Sons, MD;  Location: [facility] ORS;  Service: Urology;  Laterality: Left;  . ESOPHAGOGASTRODUODENOSCOPY N/A 12/07/2014   Procedure: ESOPHAGOGASTRODUODENOSCOPY (EGD);  Surgeon: Manya Silvas, MD;  Location: [facility] ENDOSCOPY;  Service: Endoscopy;  Laterality: N/A;  . NEPHRECTOMY    . POLYPECTOMY  10/10/2016   Procedure: POLYPECTOMY;  Surgeon: Lucilla Lame, MD;  Location: [place];  Service: Endoscopy;;  . SUPRAPUBIC CATHETER INSERTION      Home Medications:  No outpatient medications have been marked as taking for the 05/11/19 encounter [facility]).    Allergies:  Allergies  Allergen Reactions  . Mellaril [Thioridazine] Shortness Of Breath and Swelling  . Buprenorphine Itching  . Morphine And Related Itching  . Navane [Thiothixene] Other (See Comments)    Reaction:  GI upset   . Thorazine [Chlorpromazine] Other (See Comments)    Reaction:  Dizziness/fainting     Family History  Problem Relation Age of Onset  . Stroke Father   . Pneumonia Father   . Brain cancer Mother     Social History:  reports that he has been smoking cigarettes. He has a 55.00 pack-year smoking history. He has quit using smokeless tobacco.  His smokeless tobacco use included chew. He reports that he does not drink alcohol or use  drugs.  ROS: Unable to obtain due to clinical condition  Physical Exam:  Vital signs in last 24 hours: Temp:  [98.4 F (36.9 C)] 98.4 F (36.9 C) (12/09 1045) Pulse Rate:  [124-135] 135 (12/09 1245) Resp:  [31-42] 37 (12/09 1245) BP: (110-162)/(74-97) 155/97 (12/09 1245) SpO2:  [94 %-100 %] 99 % (12/09 1245) FiO2 (%):  [30 %] 30 % (12/09 1323) Weight:  [85.7 kg] 85.7 kg (12/09 1045) Constitutional:  Alert and oriented, No acute  distress HEENT: [place] AT, moist mucus membranes.  Trachea midline, no masses Cardiovascular: Tachycardic Respiratory: Significant respiratory distress, tachypneic on BiPAP GI: Abdomen is soft, nontender, nondistended, no abdominal masses GU: No CVA tenderness Skin: No rashes, bruises or suspicious lesions Neurologic: Grossly intact, no focal deficits, moving all 4 extremities Psychiatric: Normal mood and affect   Laboratory Data:  Recent Labs    05/11/19 1047  WBC 13.9*  HGB 11.5*  HCT 33.7*   Recent Labs    05/11/19 1047  NA 133*  K 3.9  CL 103  CO2 15*  GLUCOSE 152*  BUN 47*  CREATININE 3.47*  CALCIUM 8.3*   Recent Labs    05/11/19 1047  INR 1.2   No results for input(s): LABURIN in the last 72 hours. Results for orders placed or performed during the hospital encounter of 01/20/19  SARS Coronavirus 2 [facility] order, Performed in [facility] hospital lab) Nasopharyngeal Nasopharyngeal Swab     Status: None   Collection Time: 01/20/19  8:44 AM   Specimen: Nasopharyngeal Swab  Result Value Ref Range Status   SARS Coronavirus 2 NEGATIVE NEGATIVE Final    Comment: (NOTE) If result is NEGATIVE SARS-CoV-2 target nucleic acids are NOT DETECTED. The SARS-CoV-2 RNA is generally detectable in upper and lower  respiratory specimens during the acute phase of infection. The lowest  concentration of SARS-CoV-2 viral copies this assay can detect is 250  copies / mL. A negative result does not preclude SARS-CoV-2 infection  and should not be used as the sole basis for treatment or other  patient management decisions.  A negative result may occur with  improper specimen collection / handling, submission of specimen other  than nasopharyngeal swab, presence of viral mutation(s) within the  areas targeted by this assay, and inadequate number of viral copies  (<250 copies / mL). A negative result must be combined with clinical  observations, patient history, and epidemiological  information. If result is POSITIVE SARS-CoV-2 target nucleic acids are DETECTED. The SARS-CoV-2 RNA is generally detectable in upper and lower  respiratory specimens dur ing the acute phase of infection.  Positive  results are indicative of active infection with SARS-CoV-2.  Clinical  correlation with patient history and other diagnostic information is  necessary to determine patient infection status.  Positive results do  not rule out bacterial infection or co-infection with other viruses. If result is PRESUMPTIVE POSTIVE SARS-CoV-2 nucleic acids MAY BE PRESENT.   A presumptive positive result was obtained on the submitted specimen  and confirmed on repeat testing.  While 2019 novel coronavirus  (SARS-CoV-2) nucleic acids may be present in the submitted sample  additional confirmatory testing may be necessary for epidemiological  and / or clinical management purposes  to differentiate between  SARS-CoV-2 and other Sarbecovirus currently known to infect humans.  If clinically indicated additional testing with an alternate test  methodology [phone]) is advised. The SARS-CoV-2 RNA is generally  detectable in upper and lower respiratory sp ecimens during the acute  phase of infection. The expected result is Negative. Fact Sheet for Patients:  StrictlyIdeas.no Fact Sheet for Healthcare Providers: BankingDealers.co.za This test is not yet approved or cleared by the [place] FDA and has been authorized for detection and/or diagnosis of SARS-CoV-2 by FDA under an Emergency Use Authorization (EUA).  This EUA will remain in effect (meaning this test can be used) for the duration of the COVID-19 declaration under Section 564(b)(1) of the Act, 21 U.S.C. section 360bbb-3(b)(1), unless the authorization is terminated or revoked sooner. Performed at [facility], [address] [place], [place] [address]   Culture, blood (Routine x  2)     Status: None   Collection Time: 01/20/19  8:45 AM   Specimen: BLOOD  Result Value Ref Range Status   Specimen Description BLOOD LEFT [facility]  Final   Special Requests   Final    BOTTLES DRAWN AEROBIC AND ANAEROBIC Blood Culture results may not be optimal due to an excessive volume of blood received in culture bottles   Culture   Final    NO GROWTH 5 DAYS Performed at [facility], [address] [place], [place] [address]    Report Status 01/25/2019 FINAL  Final  Culture, blood (Routine x 2)     Status: None   Collection Time: 01/20/19  8:45 AM   Specimen: BLOOD  Result Value Ref Range Status   Specimen Description BLOOD RIGHT [facility]  Final   Special Requests   Final    BOTTLES DRAWN AEROBIC AND ANAEROBIC Blood Culture results may not be optimal due to an excessive volume of blood received in culture bottles   Culture   Final    NO GROWTH 5 DAYS Performed at [facility], [address] [place], [place] [address]    Report Status 01/25/2019 FINAL  Final  Gastrointestinal Panel by PCR , Stool     Status: None   Collection Time: 01/20/19  2:04 PM   Specimen: Stool  Result Value Ref Range Status   Campylobacter species NOT DETECTED NOT DETECTED Final   Plesimonas shigelloides NOT DETECTED NOT DETECTED Final   Salmonella species NOT DETECTED NOT DETECTED Final   Yersinia enterocolitica NOT DETECTED NOT DETECTED Final   Vibrio species NOT DETECTED NOT DETECTED Final   Vibrio cholerae NOT DETECTED NOT DETECTED Final   Enteroaggregative E coli (EAEC) NOT DETECTED NOT DETECTED Final   Enteropathogenic E coli (EPEC) NOT DETECTED NOT DETECTED Final   Enterotoxigenic E coli (ETEC) NOT DETECTED NOT DETECTED Final   Shiga like toxin producing E coli (STEC) NOT DETECTED NOT DETECTED Final   Shigella/Enteroinvasive E coli (EIEC) NOT DETECTED NOT DETECTED Final   Cryptosporidium NOT DETECTED NOT DETECTED Final   Cyclospora cayetanensis NOT DETECTED NOT DETECTED Final    Entamoeba histolytica NOT DETECTED NOT DETECTED Final   Giardia lamblia NOT DETECTED NOT DETECTED Final   Adenovirus F40/41 NOT DETECTED NOT DETECTED Final   Astrovirus NOT DETECTED NOT DETECTED Final   Norovirus GI/GII NOT DETECTED NOT DETECTED Final   Rotavirus A NOT DETECTED NOT DETECTED Final   Sapovirus (I, II, IV, and V) NOT DETECTED NOT DETECTED Final    Comment: Performed at [facility], [address] [place], [place] [address]  C difficile quick scan w PCR reflex     Status: None   Collection Time: 01/20/19  2:04 PM   Specimen: STOOL  Result Value Ref Range Status   C Diff antigen NEGATIVE NEGATIVE Final  C Diff toxin NEGATIVE NEGATIVE Final   C Diff interpretation No C. difficile detected.  Final    Comment: Performed at [facility], [address] [place], [place] [address]  MRSA PCR Screening     Status: Abnormal   Collection Time: 01/20/19  4:48 PM   Specimen: Nasopharyngeal  Result Value Ref Range Status   MRSA by PCR POSITIVE (A) NEGATIVE Final    Comment:        The GeneXpert MRSA Assay (FDA approved for NASAL specimens only), is one component of a comprehensive MRSA colonization surveillance program. It is not intended to diagnose MRSA infection nor to guide or monitor treatment for MRSA infections. RESULT CALLED TO, READ BACK BY AND VERIFIED WITH: DEDRA [facility] 01/20/19 @ 1857  [place] Performed at [facility], [address] [place], [place] [address]   Urine Culture     Status: None   Collection Time: 01/20/19 10:02 PM   Specimen: Urine, Random  Result Value Ref Range Status   Specimen Description   Final    URINE, RANDOM Performed at [facility], [address] [place], [place] [address]    Special Requests   Final    NONE Performed at [facility], [address] [place],  [address]    Culture   Final    Multiple bacterial morphotypes present, none predominant. Suggest appropriate  recollection if clinically indicated.   Report Status 01/22/2019 FINAL  Final     Radiologic Imaging: Dg Chest Port 1 View  Result Date: 05/11/2019 CLINICAL DATA:  Dyspnea. EXAM: PORTABLE CHEST 1 VIEW COMPARISON:  01/22/2019 FINDINGS: Right IJ Port-A-Cath with tip over the SVC. Lungs are hypoinflated with mild prominence of the central pulmonary vessels suggesting mild vascular congestion. No lobar consolidation or effusion. There is subtle patchy density in the mid lungs as could not exclude possible underlying infection. No pneumothorax. Cardiomediastinal silhouette and remainder of the exam is unchanged. IMPRESSION: Findings suggesting mild vascular congestion. Subtle patchy density in the mid lungs which could represent underlying infection. Right IJ Port-A-Cath with tip over the SVC. Electronically Signed   By: Marin Olp M.D.   On: 05/11/2019 11:10   Ct Renal Stone Study  Result Date: 05/11/2019 CLINICAL DATA:  Flank pain. Recurrent disease suspected. EXAM: CT ABDOMEN AND PELVIS WITHOUT CONTRAST TECHNIQUE: Multidetector CT imaging of the abdomen and pelvis was performed following the standard protocol without IV contrast. COMPARISON:  CT of the abdomen and pelvis without contrast 3/20/. FINDINGS: Lower chest: Patchy airspace opacities are present in the lower lobes bilaterally. Heart size is normal. No significant pleural or pericardial effusion present. Hepatobiliary: Hepatic cysts are stable. No new lesions are present. The largest lesion laterally in inferior right lower lobe measuring 18 mm. The common bile duct and gallbladder are normal. Pancreas: Unremarkable. No pancreatic ductal dilatation or surrounding inflammatory changes. Spleen: Normal in size without focal abnormality. Adrenals/Urinary Tract: Adrenal glands are normal bilaterally. Right nephrectomy noted. Prominent staghorn calculus seen on the prior study is near completely resolved. Prominent parenchymal calcification in the  midportion of the left kidney measures 11 mm. Punctate calcification near the lower pole of the left kidney is stable. Mild prominence of the left ureter present above the level of a partially obstructing 5 mm stone at pelvic inlet. More distal ureter is normal. The urinary bladder is within limits. Stomach/Bowel: Stomach and duodenum are within normal limits. Bowel is unremarkable. Terminal ileum is normal. The ascending and transverse colon  are within normal limits. Descending and sigmoid colon are normal. Vascular/Lymphatic: Atherosclerotic calcifications are present in the aorta and branch vessels without aneurysm. Reproductive: Dense calcifications are present prostate gland. No discrete lesion is present. Other: Choose 1 Musculoskeletal: No acute or significant osseous findings. IMPRESSION: 1. Partially obstructing 5 mm stone at the left pelvic inlet. 2. Mild prominence of the left ureter above the level the stone. 3. Marked reduction in stone burden in the left kidney. 4. Right nephrectomy. 5. Stable hepatic cysts. 6. Aortic Atherosclerosis (ICD10-I70.0). 7. Patchy airspace disease the lung bases bilaterally raises concern for infection. Electronically Signed   By: San Morelle M.D.   On: 05/11/2019 11:52   CT scan personally reviewed, agree with radiology interpretation  Impression/Assessment:  64 year old male with solitary kidney, left obstructing ureteral calculus presenting with sepsis, tachypnea and respiratory failure as well as acute on chronic renal failure.  Given the constellation of the above, unable to rule out obstructing calculus of the nidus for this infection although may be pulmonary.  Regardless, he is solitary kidney which is obstructed and currently is anuric and in worsening renal failure.  As such, I recommended emergent ureteral stent placement.  Case was discussed with Dr. Ellender Hose as well as Dr. Patsey Berthold from the ICU.  Given his respiratory compromise, it is best taken  to the operating room in a controlled setting for intubation, stent can be placed and he will be transported to the ICU postoperatively for further care.  Patient understands all of this and is agreeable this plan.  Plan:  -Consent obtained verbally, order placed -Site marked on left -All questions answered -Patient is already received IV meropenem, will sendUA urine culture from OR -Further care per ICU team postoperatively  05/11/2019, 1:38 PM  Hollice Espy,  MD

## 2019-05-11 NOTE — ED Provider Notes (Signed)
[facility] Emergency Department Provider Note  ____________________________________________   First MD Initiated Contact with Patient 05/11/19 1056     (approximate)  I have reviewed the triage vital signs and the nursing notes.   HISTORY  Chief Complaint Shortness of Breath    HPI Alan Small is a 63 y.o. male with complicated past medical history include in schizophrenia, COPD, history of left partial staghorn calculus sp lithotripsy in Nov 2020 with Dr. Tommi Rumps, solitary kidney, here with SOB and flank pain.  Per report, the patient has a solitary kidney that has undergone multiple ureteral stents.  He most recently had lithotripsy of a partial staghorn in November 2020.  The patient states that over the last 24 hours, he has felt progressively worsening flank pain, shortness of breath, and general fatigue.  He has been nauseous and vomiting.  He has had subjective fevers.  He is currently on imipenem via a port.  He denies any overt urinary symptoms.  He does not believe his been exposed to anyone with Covid.  No other acute complaints.  No specific alleviating aggravating factors.       Past Medical History:  Diagnosis Date   Asthma    COPD (chronic obstructive pulmonary disease) ([place])    Depression    External hemorrhoids    Gastric ulcer    GERD (gastroesophageal reflux disease)    Hydronephrosis    Hypothyroidism    hypo   Insomnia    Mental disorder    Myocardial infarction ([place])    Paranoid schizophrenia ([place])    Renal disorder    renal failure   Seizures ([place])    Stroke [facility])     Patient Active Problem List   Diagnosis Date Noted   Community acquired pneumonia 01/20/2019   Gastroenteritis 01/20/2019   Blood in stool    Change in bowel habits    Constipation    Polyp of sigmoid colon    Depression 02/26/2016   COPD (chronic obstructive pulmonary disease) ([place]) 02/03/2016   Suprapubic catheter  ([place]) 12/17/2015   Urethral stricture 12/17/2015   Urinary retention 10/27/2015   Tobacco use disorder 08/27/2015   Malnutrition of moderate degree 05/09/2015   Sepsis ([place]) 05/06/2015   UTI (lower urinary tract infection) 01/17/2015   Hypoglycemia 01/17/2015   Gastroesophageal reflux disease 10/28/2014   Bright red rectal bleeding 10/26/2014   Chronic constipation 10/26/2014   Hydronephrosis of left kidney 01/17/2014   Neurogenic bladder 06/21/2013   Urolithiasis 03/15/2013   Nephrolithiasis 50/38/8828   Metabolic acidosis 00/34/9179   Acute renal failure ([place]) 11/03/2012   Hyperkalemia 11/03/2012   Pyelonephritis 11/03/2012   Schizophrenia ([place]) 11/03/2012   Hypothyroidism 11/03/2012   Severe sepsis ([place]) 11/03/2012   Protein-calorie malnutrition, severe ([place]) 11/03/2012   Encephalopathy acute 11/03/2012   Chronic kidney disease, stage IV (severe) ([place]) 07/02/2012   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   Kidney stone 04/13/2012    Past Surgical History:  Procedure Laterality Date   APPENDECTOMY     COLONOSCOPY WITH PROPOFOL N/A 12/07/2014   Procedure: COLONOSCOPY WITH PROPOFOL;  Surgeon: Manya Silvas, MD;  Location: [facility] ENDOSCOPY;  Service: Endoscopy;  Laterality: N/A;   COLONOSCOPY WITH PROPOFOL N/A 10/10/2016   Procedure: COLONOSCOPY WITH PROPOFOL;  Surgeon: Lucilla Lame, MD;  Location: [place];  Service: Endoscopy;  Laterality: N/A;   CYSTOSCOPY W/ URETERAL STENT PLACEMENT Left 01/21/2019   Procedure: CYSTOSCOPY WITH RETROGRADE PYELOGRAM/URETERAL STENT PLACEMENT;  Surgeon: Abbie Sons, MD;  Location: [facility] ORS;  Service: Urology;  Laterality: Left;   ESOPHAGOGASTRODUODENOSCOPY N/A 12/07/2014   Procedure: ESOPHAGOGASTRODUODENOSCOPY (EGD);  Surgeon: Manya Silvas, MD;  Location: [facility] ENDOSCOPY;  Service: Endoscopy;  Laterality: N/A;   NEPHRECTOMY     POLYPECTOMY  10/10/2016   Procedure: POLYPECTOMY;  Surgeon: Lucilla Lame, MD;   Location: [place];  Service: Endoscopy;;   SUPRAPUBIC CATHETER INSERTION      Prior to Admission medications   Medication Sig Start Date End Date Taking? Authorizing Provider  acetaminophen (TYLENOL) 500 MG tablet Take 500 mg by mouth every 8 (eight) hours as needed for mild pain, moderate pain or fever.    Yes [provider]  albuterol-ipratropium (COMBIVENT) 18-103 MCG/ACT inhaler Inhale 1 puff into the lungs every 6 (six) hours as needed for wheezing or shortness of breath.    Yes [provider]  budesonide-formoterol (SYMBICORT) 160-4.5 MCG/ACT inhaler Inhale 1 puff into the lungs 2 (two) times daily.  03/03/16  Yes [provider]  diphenhydrAMINE (BENADRYL ALLERGY) 25 MG tablet Take 25 mg by mouth every 6 (six) hours as needed.   Yes [provider]  docusate sodium (COLACE) 100 MG capsule Take 100 mg by mouth 2 (two) times daily.   Yes [provider]  FLUoxetine (PROZAC) 20 MG capsule Take 60 mg by mouth every morning.   Yes [provider]  INVEGA SUSTENNA 156 MG/ML SUSY injection Inject 156 mg into the muscle every 30 (thirty) days. 08/20/18  Yes [provider]  levothyroxine (SYNTHROID, LEVOTHROID) 50 MCG tablet Take 50 mcg by mouth daily before breakfast.   Yes [provider]  lidocaine (LIDODERM) 5 % Place 1 patch onto the skin daily as needed. Remove & Discard patch within 12 hours or as directed by MD   Yes [provider]  nystatin cream (MYCOSTATIN) Apply 1 application topically 2 (two) times daily.   Yes [provider]  omeprazole (PRILOSEC) 20 MG capsule Take 20 mg by mouth daily.   Yes [provider]  oxybutynin (DITROPAN) 5 MG tablet Take 5 mg by mouth 3 (three) times daily.   Yes [provider]  sodium bicarbonate 650 MG tablet Take 650 mg by mouth daily.    Yes [provider]  tamsulosin (FLOMAX) 0.4 MG CAPS capsule Take 0.4 mg by mouth  every morning.    Yes [provider]    Allergies Mellaril [thioridazine], Buprenorphine, Morphine and related, Navane [thiothixene], and Thorazine [chlorpromazine]  Family History  Problem Relation Age of Onset   Stroke Father    Pneumonia Father    Brain cancer Mother     Social History Social History   Tobacco Use   Smoking status: Current Every Day Smoker    Packs/day: 1.00    Years: 55.00    Pack years: 55.00    Types: Cigarettes   Smokeless tobacco: Former Systems developer    Types: Chew  Substance Use Topics   Alcohol use: No   Drug use: No    Review of Systems  Review of Systems  Constitutional: Positive for fatigue and fever. Negative for chills.  HENT: Negative for sore throat.   Respiratory: Positive for shortness of breath.   Cardiovascular: Negative for chest pain.  Gastrointestinal: Negative for abdominal pain.  Genitourinary: Positive for flank pain.  Musculoskeletal: Negative for neck pain.  Skin: Negative for rash and wound.  Allergic/Immunologic: Negative for immunocompromised state.  Neurological: Positive for weakness. Negative for numbness.  Hematological: Does  not bruise/bleed easily.  All other systems reviewed and are negative.    ____________________________________________  PHYSICAL EXAM:      VITAL SIGNS: ED Triage Vitals  Enc Vitals Group     BP 05/11/19 1037 131/76     Pulse Rate 05/11/19 1037 (!) 130     Resp 05/11/19 1037 (!) 42     Temp 05/11/19 1045 98.4 F (36.9 C)     Temp Source 05/11/19 1045 Oral     SpO2 05/11/19 1037 97 %     Weight 05/11/19 1045 189 lb (85.7 kg)     Height 05/11/19 1045 6' (1.829 m)     Head Circumference --      Peak Flow --      Pain Score --      Pain Loc --      Pain Edu? --      Excl. in [place]? --      Physical Exam Vitals signs and nursing note reviewed.  Constitutional:      General: He is not in acute distress.    Appearance: He is well-developed. He is ill-appearing.  HENT:       Head: Normocephalic and atraumatic.  Eyes:     Conjunctiva/sclera: Conjunctivae normal.  Neck:     Musculoskeletal: Neck supple.  Cardiovascular:     Rate and Rhythm: Regular rhythm. Tachycardia present.     Heart sounds: Normal heart sounds. No murmur. No friction rub.  Pulmonary:     Effort: Pulmonary effort is normal. Tachypnea present. No respiratory distress.     Breath sounds: Normal breath sounds. No wheezing or rales.  Abdominal:     General: There is no distension.     Palpations: Abdomen is soft.     Tenderness: There is no abdominal tenderness.  Skin:    General: Skin is warm.     Capillary Refill: Capillary refill takes less than 2 seconds.  Neurological:     Mental Status: He is alert and oriented to person, place, and time.     Motor: No abnormal muscle tone.       ____________________________________________   LABS (all labs ordered are listed, but only abnormal results are displayed)  Labs Reviewed  CBC - Abnormal; Notable for the following components:      Result Value   WBC 13.9 (*)    RBC 3.91 (*)    Hemoglobin 11.5 (*)    HCT 33.7 (*)    Platelets 84 (*)    All other components within normal limits  COMPREHENSIVE METABOLIC PANEL - Abnormal; Notable for the following components:   Sodium 133 (*)    CO2 15 (*)    Glucose, Bld 152 (*)    BUN 47 (*)    Creatinine, Ser 3.47 (*)    Calcium 8.3 (*)    Total Protein 6.2 (*)    Albumin 2.6 (*)    AST 51 (*)    GFR calc non Af Amer 18 (*)    GFR calc Af Amer 20 (*)    All other components within normal limits  LACTIC ACID, PLASMA - Abnormal; Notable for the following components:   Lactic Acid, Venous 3.6 (*)    All other components within normal limits  PROTIME-INR - Abnormal; Notable for the following components:   Prothrombin Time 15.5 (*)    All other components within normal limits  BLOOD GAS, VENOUS - Abnormal; Notable for the following components:   pCO2, Ven 36 (*)  pO2, Ven <31.0  (*)    Acid-base deficit 3.9 (*)    All other components within normal limits  BLOOD GAS, VENOUS - Abnormal; Notable for the following components:   pCO2, Ven 35 (*)    Bicarbonate 17.6 (*)    Acid-base deficit 7.8 (*)    All other components within normal limits  RESPIRATORY PANEL BY RT PCR (FLU A&B, COVID)  CULTURE, BLOOD (ROUTINE X 2)  CULTURE, BLOOD (ROUTINE X 2)  URINE CULTURE  C DIFFICILE QUICK SCREEN W PCR REFLEX  URINE CULTURE  LACTIC ACID, PLASMA  URINALYSIS, ROUTINE W REFLEX MICROSCOPIC  URINALYSIS, ROUTINE W REFLEX MICROSCOPIC  CBC  CREATININE, SERUM  LACTIC ACID, PLASMA  LACTIC ACID, PLASMA  PROCALCITONIN  BLOOD GAS, ARTERIAL  TRIGLYCERIDES  POC SARS CORONAVIRUS 2 AG -  ED  POC SARS CORONAVIRUS 2 AG    ____________________________________________  EKG: Sinus tachycardia, ventricular rate 130.  QRS 97, QTc 442.  Nonspecific ST changes, likely rate related. ________________________________________  RADIOLOGY All imaging, including plain films, CT scans, and ultrasounds, independently reviewed by me, and interpretations confirmed via formal radiology reads.  ED MD interpretation:   Chest x-ray: Possible basilar pneumonia CT stone: New, partially obstructing left ureteral stone, basilar pneumonia  Official radiology report(s): Dg Chest Port 1 View  Result Date: 05/11/2019 CLINICAL DATA:  Dyspnea. EXAM: PORTABLE CHEST 1 VIEW COMPARISON:  01/22/2019 FINDINGS: Right IJ Port-A-Cath with tip over the SVC. Lungs are hypoinflated with mild prominence of the central pulmonary vessels suggesting mild vascular congestion. No lobar consolidation or effusion. There is subtle patchy density in the mid lungs as could not exclude possible underlying infection. No pneumothorax. Cardiomediastinal silhouette and remainder of the exam is unchanged. IMPRESSION: Findings suggesting mild vascular congestion. Subtle patchy density in the mid lungs which could represent underlying  infection. Right IJ Port-A-Cath with tip over the SVC. Electronically Signed   By: Marin Olp M.D.   On: 05/11/2019 11:10   Dg Or Urology Cysto Image ([facility] Only)  Result Date: 05/11/2019 There is no interpretation for this exam.  This order is for images obtained during a surgical procedure.  Please See "Surgeries" Tab for more information regarding the procedure.   Ct Renal Stone Study  Result Date: 05/11/2019 CLINICAL DATA:  Flank pain. Recurrent disease suspected. EXAM: CT ABDOMEN AND PELVIS WITHOUT CONTRAST TECHNIQUE: Multidetector CT imaging of the abdomen and pelvis was performed following the standard protocol without IV contrast. COMPARISON:  CT of the abdomen and pelvis without contrast 3/20/. FINDINGS: Lower chest: Patchy airspace opacities are present in the lower lobes bilaterally. Heart size is normal. No significant pleural or pericardial effusion present. Hepatobiliary: Hepatic cysts are stable. No new lesions are present. The largest lesion laterally in inferior right lower lobe measuring 18 mm. The common bile duct and gallbladder are normal. Pancreas: Unremarkable. No pancreatic ductal dilatation or surrounding inflammatory changes. Spleen: Normal in size without focal abnormality. Adrenals/Urinary Tract: Adrenal glands are normal bilaterally. Right nephrectomy noted. Prominent staghorn calculus seen on the prior study is near completely resolved. Prominent parenchymal calcification in the midportion of the left kidney measures 11 mm. Punctate calcification near the lower pole of the left kidney is stable. Mild prominence of the left ureter present above the level of a partially obstructing 5 mm stone at pelvic inlet. More distal ureter is normal. The urinary bladder is within limits. Stomach/Bowel: Stomach and duodenum are within normal limits. Bowel is unremarkable. Terminal ileum is normal. The ascending and transverse  colon are within normal limits. Descending and sigmoid colon are  normal. Vascular/Lymphatic: Atherosclerotic calcifications are present in the aorta and branch vessels without aneurysm. Reproductive: Dense calcifications are present prostate gland. No discrete lesion is present. Other: Choose 1 Musculoskeletal: No acute or significant osseous findings. IMPRESSION: 1. Partially obstructing 5 mm stone at the left pelvic inlet. 2. Mild prominence of the left ureter above the level the stone. 3. Marked reduction in stone burden in the left kidney. 4. Right nephrectomy. 5. Stable hepatic cysts. 6. Aortic Atherosclerosis (ICD10-I70.0). 7. Patchy airspace disease the lung bases bilaterally raises concern for infection. Electronically Signed   By: San Morelle M.D.   On: 05/11/2019 11:52    ____________________________________________  PROCEDURES   Procedure(s) performed (including Critical Care):  .Critical Care Performed by: Duffy Bruce, MD Authorized by: Duffy Bruce, MD   Critical care provider statement:    Critical care time (minutes):  35   Critical care time was exclusive of:  Separately billable procedures and treating other patients and teaching time   Critical care was necessary to treat or prevent imminent or life-threatening deterioration of the following conditions:  Dehydration, circulatory failure, respiratory failure and sepsis   Critical care was time spent personally by me on the following activities:  Development of treatment plan with patient or surrogate, discussions with consultants, evaluation of patient's response to treatment, examination of patient, obtaining history from patient or surrogate, ordering and performing treatments and interventions, ordering and review of laboratory studies, ordering and review of radiographic studies, pulse oximetry, re-evaluation of patient's condition and review of old charts   I assumed direction of critical care for this patient from another provider in my specialty: no       ____________________________________________  INITIAL IMPRESSION / MDM / [place] / ED COURSE  As part of my medical decision making, I reviewed the following data within the [place] notes reviewed and incorporated, Old chart reviewed, Notes from prior ED visits, and [place] Controlled Substance Database       *Alan Small was evaluated in Emergency Department on 05/11/2019 for the symptoms described in the history of present illness. He was evaluated in the context of the global COVID-19 pandemic, which necessitated consideration that the patient might be at risk for infection with the SARS-CoV-2 virus that causes COVID-19. Institutional protocols and algorithms that pertain to the evaluation of patients at risk for COVID-19 are in a state of rapid change based on information released by regulatory bodies including the CDC and federal and state organizations. These policies and algorithms were followed during the patient's care in the ED.  Some ED evaluations and interventions may be delayed as a result of limited staffing during the pandemic.*     Medical Decision Making: 63 year old male here with severe sepsis, likely secondary to infected left ureteral stone and solitary kidney.  Unfortunately, he has minimal urine output despite fluid resuscitation so urinalysis unable to be obtained, but stone study shows obstruction and lab work shows acute kidney injury, necessitating stent placement.  Appreciate Dr. Cherrie Gauze help, and patient will go to the OR.  Otherwise, he does have basilar pneumonia on chest x-ray.  His Covid is negative.  Lactic acid elevated.  I suspect his fever, leukocytosis, AKI, and symptoms are more so due to infected stone, but he has been given meropenem which should cover broadly.  Will hold on vancomycin in the setting of AKI with obstructed stone.   ____________________________________________  FINAL CLINICAL IMPRESSION(S) / ED  DIAGNOSES  Final diagnoses:  Acute renal failure superimposed on stage 4 chronic kidney disease, unspecified acute renal failure type ([place])  Sepsis secondary to UTI ([place])  Acute respiratory failure with hypoxia ([place])     MEDICATIONS GIVEN DURING THIS VISIT:  Medications  sodium bicarbonate 150 mEq in sterile water 1,000 mL infusion ( Intravenous New Bag/Given 05/11/19 1239)  fentaNYL (SUBLIMAZE) injection 25 mcg (has no administration in time range)  heparin injection 5,000 Units (has no administration in time range)  pantoprazole (PROTONIX) injection 40 mg (has no administration in time range)  lactated ringers infusion ( Intravenous New Bag/Given 05/11/19 1656)  acetaminophen (TYLENOL) tablet 650 mg (has no administration in time range)  ondansetron (ZOFRAN) injection 4 mg (has no administration in time range)  ipratropium-albuterol (DUONEB) 0.5-2.5 (3) MG/3ML nebulizer solution 3 mL (has no administration in time range)  fentaNYL 2561mcg in NS 25mL (41mcg/ml) infusion-PREMIX (has no administration in time range)  propofol (DIPRIVAN) 1000 MG/100ML infusion (has no administration in time range)  sodium chloride flush 0.9 % injection (has no administration in time range)  fentaNYL 10 mcg/ml infusion (has no administration in time range)  sodium chloride 0.9 % bolus 1,000 mL (0 mLs Intravenous Stopped 05/11/19 1234)    And  sodium chloride 0.9 % bolus 1,000 mL (0 mLs Intravenous Stopped 05/11/19 1401)  meropenem (MERREM) 1 g in sodium chloride 0.9 % 100 mL IVPB (0 g Intravenous Stopped 05/11/19 1235)  acetaminophen (TYLENOL) tablet 1,000 mg (1,000 mg Oral Given 05/11/19 1241)  fentaNYL (SUBLIMAZE) injection 50 mcg (50 mcg Intravenous Given 05/11/19 1241)  ondansetron (ZOFRAN) injection 4 mg (4 mg Intravenous Given 05/11/19 1240)  sodium chloride 0.9 % bolus 500 mL (0 mLs Intravenous Stopped 05/11/19 1401)  ipratropium-albuterol (DUONEB) 0.5-2.5 (3) MG/3ML nebulizer solution 3 mL (3 mLs  Nebulization Given 05/11/19 1313)  ipratropium-albuterol (DUONEB) 0.5-2.5 (3) MG/3ML nebulizer solution (3 mLs  Given 05/11/19 1322)  methylPREDNISolone sodium succinate (SOLU-MEDROL) 125 mg/2 mL injection 125 mg (125 mg Intravenous Given 05/11/19 1422)  propofol (DIPRIVAN) 10 mg/mL bolus/IV push (has no administration in time range)  midazolam (VERSED) 2 MG/2ML injection (has no administration in time range)  fentaNYL (SUBLIMAZE) 100 MCG/2ML injection (has no administration in time range)  propofol (DIPRIVAN) 10 mg/mL bolus/IV push (has no administration in time range)  vasopressin (PITRESSIN) 20 UNIT/ML injection (has no administration in time range)  midazolam (VERSED) 2 MG/2ML injection (has no administration in time range)     ED Discharge Orders    None       Note:  This document was prepared using Dragon voice recognition software and may include unintentional dictation errors.   Duffy Bruce, MD 05/11/19 [id number]

## 2019-05-11 NOTE — ED Notes (Signed)
Patient off unit to CT

## 2019-05-11 NOTE — ED Triage Notes (Signed)
BIBA for dyspnea since 0100. Coming from care home where he receives IV abx for infected renal stent. A&O, markedly tachypneic and tachcardic.

## 2019-05-11 NOTE — Progress Notes (Signed)
Pharmacy Antibiotic Note  Alan Small is a 63 y.o. male admitted on 05/11/2019. Pharmacy has been consulted for meropenem dosing and Vancomycin due to report of + blood cx.  Plan: Meropenem 500 mg q12h Vancomycin 1750mg  x 1 (loading dose) then will dose based on labs due to changing SCR/renal fxn  Height: 6' 0.01" (182.9 cm) Weight: 189 lb (85.7 kg) IBW/kg (Calculated) : 77.62  Temp (24hrs), Avg:98.5 F (36.9 C), Min:96.3 F (35.7 C), Max:99.1 F (37.3 C)  Recent Labs  Lab 05/11/19 1047 05/11/19 1246 05/11/19 1644  WBC 13.9*  --  13.0*  CREATININE 3.47*  --  2.99*  LATICACIDVEN 3.6* 1.7 1.2    Estimated Creatinine Clearance: 27.4 mL/min (A) (by C-G formula based on SCr of 2.99 mg/dL (H)).    Allergies  Allergen Reactions  . Mellaril [Thioridazine] Shortness Of Breath and Swelling  . Buprenorphine Itching  . Morphine And Related Itching  . Navane [Thiothixene] Other (See Comments)    Reaction:  GI upset   . Thorazine [Chlorpromazine] Other (See Comments)    Reaction:  Dizziness/fainting    Antimicrobials this admission: Meropenem 12/9 >> Vancomycin 12/9 >>  Dose adjustments this admission: NA  Microbiology results: 12/9 BCx: MRSA 12/9 UCx: pending  12/9 MRSA PCR: positive  Thank you for allowing pharmacy to be a part of this patient's care.  Ena Dawley, PharmD 05/11/2019 11:29 PM

## 2019-05-11 NOTE — Consult Note (Signed)
PHARMACY -  BRIEF ANTIBIOTIC NOTE   Pharmacy has received consult(s) for Meropenem for sepsis (recent history of VRE in urine) from an ED provider.  The patient's profile has been reviewed for ht/wt/allergies/indication/available labs.    One time order(s) placed for Meropenem 1g IV x 1  Further antibiotics/pharmacy consults should be ordered by admitting physician if indicated.                       Thank you,  Lu Duffel, PharmD, BCPS Clinical Pharmacist 05/11/2019 12:07 PM

## 2019-05-11 NOTE — Anesthesia Preprocedure Evaluation (Signed)
Anesthesia Evaluation  Patient identified by MRN, date of birth, ID band Patient awake    Reviewed: Allergy & Precautions, H&P , NPO status , Patient's Chart, lab work & pertinent test results, reviewed documented beta blocker date and time   Airway Mallampati: II  TM Distance: >3 FB Neck ROM: full    Dental  (+) Teeth Intact   Pulmonary asthma , pneumonia, unresolved, COPD,  COPD inhaler and oxygen dependent, Current Smoker,    Pulmonary exam normal        Cardiovascular Exercise Tolerance: Poor + Past MI  Normal cardiovascular exam Rhythm:regular Rate:Normal     Neuro/Psych Seizures -,  PSYCHIATRIC DISORDERS Depression Schizophrenia  Neuromuscular disease CVA    GI/Hepatic Neg liver ROS, PUD, GERD  Medicated,  Endo/Other  Hypothyroidism   Renal/GU Renal disease  negative genitourinary   Musculoskeletal   Abdominal   Peds  Hematology negative hematology ROS (+)   Anesthesia Other Findings Past Medical History: No date: Asthma No date: COPD (chronic obstructive pulmonary disease) (HCC) No date: Depression No date: External hemorrhoids No date: Gastric ulcer No date: GERD (gastroesophageal reflux disease) No date: Hydronephrosis No date: Hypothyroidism     Comment:  hypo No date: Insomnia No date: Mental disorder No date: Myocardial infarction [facility]) No date: Paranoid schizophrenia ([place]) No date: Renal disorder     Comment:  renal failure No date: Seizures ([place]) No date: Stroke [facility]) Past Surgical History: No date: APPENDECTOMY 12/07/2014: COLONOSCOPY WITH PROPOFOL; N/A     Comment:  Procedure: COLONOSCOPY WITH PROPOFOL;  Surgeon: Manya Silvas, MD;  Location: [place];  Service:               Endoscopy;  Laterality: N/A; 10/10/2016: COLONOSCOPY WITH PROPOFOL; N/A     Comment:  Procedure: COLONOSCOPY WITH PROPOFOL;  Surgeon: Lucilla Lame, MD;  Location: [place];  Service:               Endoscopy;  Laterality: N/A; 01/21/2019: CYSTOSCOPY W/ URETERAL STENT PLACEMENT; Left     Comment:  Procedure: CYSTOSCOPY WITH RETROGRADE PYELOGRAM/URETERAL              STENT PLACEMENT;  Surgeon: Abbie Sons, MD;                Location: [facility];  Service: Urology;  Laterality: Left; 12/07/2014: ESOPHAGOGASTRODUODENOSCOPY; N/A     Comment:  Procedure: ESOPHAGOGASTRODUODENOSCOPY (EGD);  Surgeon:               Manya Silvas, MD;  Location: [facility];                Service: Endoscopy;  Laterality: N/A; No date: NEPHRECTOMY 10/10/2016: POLYPECTOMY     Comment:  Procedure: POLYPECTOMY;  Surgeon: Lucilla Lame, MD;                Location: [place];  Service: Endoscopy;; No date: SUPRAPUBIC CATHETER INSERTION BMI    Body Mass Index: 25.63 kg/m     Reproductive/Obstetrics negative OB ROS                             Anesthesia Physical Anesthesia Plan  ASA: IV and emergent  Anesthesia Plan: General ETT   Post-op Pain Management:    Induction:  PONV Risk Score and Plan: 2  Airway Management Planned:   Additional Equipment:   Intra-op Plan:   Post-operative Plan:   Informed Consent: I have reviewed the patients History and Physical, chart, labs and discussed the procedure including the risks, benefits and alternatives for the proposed anesthesia with the patient or authorized representative who has indicated his/her understanding and acceptance.     Dental Advisory Given  Plan Discussed with: CRNA  Anesthesia Plan Comments: (Plan is for post op vent support with OR intubation secondary to impending resp. Failure.  Xport to icu after procedure as discussed with Erlene Quan.  JA)        Anesthesia Quick Evaluation

## 2019-05-11 NOTE — ED Notes (Signed)
RT at bedside for bipap and neb.

## 2019-05-11 NOTE — Progress Notes (Signed)
CODE SEPSIS - PHARMACY COMMUNICATION  **Broad Spectrum Antibiotics should be administered within 1 hour of Sepsis diagnosis**  Time Code Sepsis Called/Page Received: 1059  Antibiotics Ordered: Meropenem   Time of 1st antibiotic administration: 1144  Additional action taken by pharmacy: none indicated   If necessary, Name of Provider/Nurse Contacted: N/A    Demiana Crumbley L, RPh  05/11/2019  11:09 AM

## 2019-05-11 NOTE — Op Note (Signed)
Date of procedure: 05/11/19  Preoperative diagnosis:  1. Solitary left kidney 2. Sepsis 3. Respiratory failure 4. Acute on chronic renal failure 5. Obstructing left ureteral calculus  Postoperative diagnosis:  1. Same as above 2. Bulbar urethral stricture  Procedure: 1. Cystoscopy 2. Left retrograde pyelogram 3. Left ureteral stent placement  Surgeon: Hollice Espy, MD  Anesthesia: General  Complications: None  Intraoperative findings: 10 French bulbar urethral stricture, dilated with scope.  Debris and erythema bladder consistent with probable cystitis.  Stent placed without difficulty.  EBL: Minimal  Specimens: UA/urine culture  Drains: 6 x 26 French double-J ureteral stent on left, 25 French Foley catheter with temp probe  Indication: Alan Small is a 63 y.o. patient with severe sepsis, respiratory failure, acute on chronic renal insufficiency with solitary kidney and obstructing ureteral calculus.  After reviewing the management options for treatment, he elected to proceed with the above surgical procedure(s). We have discussed the potential benefits and risks of the procedure, side effects of the proposed treatment, the likelihood of the patient achieving the goals of the procedure, and any potential problems that might occur during the procedure or recuperation. Informed consent has been obtained.  Description of procedure:  The patient was taken to the operating room and general anesthesia was induced.  The patient was placed in the dorsal lithotomy position, prepped and draped in the usual sterile fashion, and preoperative antibiotics were administered. A preoperative time-out was performed.   A 21 French scope was advanced to the level of the bulbar urethra where a fairly significant and stricture was encountered.  This approximately 10 cm in diameter.  5 [place] open-ended ureteral catheter was placed through the stricture and the scope was able to be passed bluntly  through the strictured area into the prostatic fossa and ultimately into the bladder.  The bladder itself was erythematous with a good amount of debris.  A UA/urine culture was obtained.  Attention was turned to the left ureteral orifice was cannulated using the same 5 [place] open-ended ureteral catheter.  Gentle retrograde pyelogram was performed which showed only a small amount of contrast E flux of the kidney to the level of the stone but nothing really above this level.  There was able to get a wire up to level the kidney which was confirmed under fluoroscopic guidance.  A 6 x 26 French double-J ureteral stent was then advanced over the wire up to the level of the kidney.  The wires partially drawn till full coils noted both within the renal pelvis as well as within the bladder.  The bladder was then drained and the scope was removed.  A 16 French Foley catheter was placed easily into the bladder and the balloon was filled with 10 cc of sterile water.  The patient remained intubated and was transferred to an ICU bed.  He was taken to the PACU in guarded condition and will be transferred to the ICU once a bed becomes available.  Hollice Espy, M.D.

## 2019-05-12 ENCOUNTER — Inpatient Hospital Stay
Admit: 2019-05-12 | Discharge: 2019-05-12 | Disposition: A | Discharge status: Home / Self Care | Payer: Medicare Other | Attending: Pulmonary Disease | Admitting: Pulmonary Disease

## 2019-05-12 ENCOUNTER — Inpatient Hospital Stay: Payer: Medicare Other

## 2019-05-12 DIAGNOSIS — R6520 Severe sepsis without septic shock: Secondary | ICD-10-CM | POA: Diagnosis not present

## 2019-05-12 DIAGNOSIS — B9562 Methicillin resistant Staphylococcus aureus infection as the cause of diseases classified elsewhere: Secondary | ICD-10-CM | POA: Diagnosis not present

## 2019-05-12 DIAGNOSIS — Z9911 Dependence on respirator [ventilator] status: Secondary | ICD-10-CM

## 2019-05-12 DIAGNOSIS — F17210 Nicotine dependence, cigarettes, uncomplicated: Secondary | ICD-10-CM

## 2019-05-12 DIAGNOSIS — Z905 Acquired absence of kidney: Secondary | ICD-10-CM

## 2019-05-12 DIAGNOSIS — N179 Acute kidney failure, unspecified: Secondary | ICD-10-CM

## 2019-05-12 DIAGNOSIS — J9601 Acute respiratory failure with hypoxia: Secondary | ICD-10-CM | POA: Diagnosis not present

## 2019-05-12 DIAGNOSIS — R7881 Bacteremia: Secondary | ICD-10-CM

## 2019-05-12 DIAGNOSIS — N201 Calculus of ureter: Secondary | ICD-10-CM

## 2019-05-12 DIAGNOSIS — Z888 Allergy status to other drugs, medicaments and biological substances status: Secondary | ICD-10-CM

## 2019-05-12 DIAGNOSIS — A419 Sepsis, unspecified organism: Secondary | ICD-10-CM | POA: Diagnosis not present

## 2019-05-12 DIAGNOSIS — Z87448 Personal history of other diseases of urinary system: Secondary | ICD-10-CM

## 2019-05-12 DIAGNOSIS — Z885 Allergy status to narcotic agent status: Secondary | ICD-10-CM

## 2019-05-12 DIAGNOSIS — Z936 Other artificial openings of urinary tract status: Secondary | ICD-10-CM

## 2019-05-12 DIAGNOSIS — Z87442 Personal history of urinary calculi: Secondary | ICD-10-CM

## 2019-05-12 LAB — TROPONIN I (HIGH SENSITIVITY): Troponin I (High Sensitivity): 17 ng/L (ref <18)

## 2019-05-12 LAB — BASIC METABOLIC PANEL
Anion gap: 13 (ref 5–15)
BUN: 49 mg/dL — ABNORMAL HIGH (ref 8–23)
CO2: 18 mmol/L — ABNORMAL LOW (ref 22–32)
Calcium: 7.8 mg/dL — ABNORMAL LOW (ref 8.9–10.3)
Chloride: 107 mmol/L (ref 98–111)
Creatinine, Ser: 2.99 mg/dL — ABNORMAL HIGH (ref 0.61–1.24)
GFR calc Af Amer: 24 mL/min — ABNORMAL LOW (ref >60)
GFR calc non Af Amer: 21 mL/min — ABNORMAL LOW (ref >60)
Glucose, Bld: 152 mg/dL — ABNORMAL HIGH (ref 70–99)
Potassium: 3.9 mmol/L (ref 3.5–5.1)
Sodium: 138 mmol/L (ref 135–145)

## 2019-05-12 LAB — CBC
HCT: 33.2 % — ABNORMAL LOW (ref 39.0–52.0)
Hemoglobin: 11 g/dL — ABNORMAL LOW (ref 13.0–17.0)
MCH: 29.1 pg (ref 26.0–34.0)
MCHC: 33.1 g/dL (ref 30.0–36.0)
MCV: 87.8 fL (ref 80.0–100.0)
Platelets: 67 10*3/uL — ABNORMAL LOW (ref 150–400)
RBC: 3.78 MIL/uL — ABNORMAL LOW (ref 4.22–5.81)
RDW: 14.3 % (ref 11.5–15.5)
WBC: 8.9 10*3/uL (ref 4.0–10.5)
nRBC: 0 % (ref 0.0–0.2)

## 2019-05-12 LAB — GLUCOSE, CAPILLARY
Glucose-Capillary: 132 mg/dL — ABNORMAL HIGH (ref 70–99)
Glucose-Capillary: 135 mg/dL — ABNORMAL HIGH (ref 70–99)
Glucose-Capillary: 135 mg/dL — ABNORMAL HIGH (ref 70–99)
Glucose-Capillary: 137 mg/dL — ABNORMAL HIGH (ref 70–99)
Glucose-Capillary: 139 mg/dL — ABNORMAL HIGH (ref 70–99)
Glucose-Capillary: 140 mg/dL — ABNORMAL HIGH (ref 70–99)
Glucose-Capillary: 142 mg/dL — ABNORMAL HIGH (ref 70–99)

## 2019-05-12 LAB — ECHOCARDIOGRAM COMPLETE
Height: 72.008 in
Weight: 3178.15 oz

## 2019-05-12 LAB — URINE CULTURE: Culture: NO GROWTH

## 2019-05-12 LAB — MAGNESIUM: Magnesium: 2.2 mg/dL (ref 1.7–2.4)

## 2019-05-12 LAB — PHOSPHORUS: Phosphorus: 4.5 mg/dL (ref 2.5–4.6)

## 2019-05-12 MED ORDER — MIDAZOLAM HCL 2 MG/2ML IJ SOLN
2.0000 mg | INTRAMUSCULAR | Status: DC | PRN
  Administered 2019-05-13: 2 mg via INTRAVENOUS
  Filled 2019-05-12 (×3): qty 2

## 2019-05-12 MED ORDER — SODIUM CHLORIDE 0.9 % IV SOLN
400.0000 mg | Freq: Two times a day (BID) | INTRAVENOUS | Status: DC
  Administered 2019-05-12 – 2019-05-19 (×14): 400 mg via INTRAVENOUS
  Filled 2019-05-12 (×17): qty 400

## 2019-05-12 MED ORDER — MIDAZOLAM HCL 2 MG/2ML IJ SOLN
2.0000 mg | INTRAMUSCULAR | Status: DC | PRN
  Administered 2019-05-12 – 2019-05-14 (×3): 2 mg via INTRAVENOUS
  Filled 2019-05-12: qty 2

## 2019-05-12 MED ORDER — VANCOMYCIN HCL 1.25 G IV SOLR
1250.0000 mg | INTRAVENOUS | Status: DC
  Filled 2019-05-12: qty 1250

## 2019-05-12 MED ORDER — NOREPINEPHRINE 16 MG/250ML-% IV SOLN
0.0000 ug/min | INTRAVENOUS | Status: DC
  Administered 2019-05-12: 4 ug/min via INTRAVENOUS
  Filled 2019-05-12 (×2): qty 250

## 2019-05-12 MED ORDER — ALBUTEROL SULFATE (2.5 MG/3ML) 0.083% IN NEBU
2.5000 mg | INHALATION_SOLUTION | RESPIRATORY_TRACT | Status: DC | PRN
  Administered 2019-05-21: 2.5 mg via RESPIRATORY_TRACT

## 2019-05-12 MED ORDER — MIDAZOLAM HCL 2 MG/2ML IJ SOLN
INTRAMUSCULAR | Status: AC
  Administered 2019-05-12: 2 mg via INTRAVENOUS
  Filled 2019-05-12: qty 2

## 2019-05-12 MED ORDER — SODIUM CHLORIDE 0.9 % IV SOLN: 300.0000 mg | Freq: Two times a day (BID) | INTRAVENOUS | Status: DC

## 2019-05-12 NOTE — Progress Notes (Signed)
[name] Education officer, museum and legal guardian of patient called for update.  Update given to social worker and legal guardian paper work received via fax and placed in chart.

## 2019-05-12 NOTE — Consult Note (Signed)
Pharmacy Antibiotic Note  Alan Small is a 63 y.o. male admitted on 05/11/2019 with a partially obstructing 5 mm left ureteral calculus with a solitary left kidney, sepsis, respiratory failure, and acute on chronic renal insufficiency.  He is POD 1 from urgent left ureteral stent placement with Dr. Erlene Quan.  He remains intubated in the ICU.  He has a h/o ESBL UTIs in the past. Pharmacy was consulted for meropenem and vancomycin dosing. He received a 1750 mg IV vancomycin loading dose  Plan: 1) vancomycin 1250 mg IV every 36h Goal AUC 400-550 Expected AUC: 474  SCr used: 2.99 (baseline 1.7)  Css expected: 30.9/12.1 mcg/mL  T1/2: 25.5h  2) meropenem 500 mg IV every 12 hours  Height: 6' 0.01" (182.9 cm) Weight: 198 lb 10.2 oz (90.1 kg) IBW/kg (Calculated) : 77.62  Temp (24hrs), Avg:98.1 F (36.7 C), Min:96.1 F (35.6 C), Max:99.1 F (37.3 C)  Recent Labs  Lab 05/11/19 1047 05/11/19 1246 05/11/19 1644 05/12/19 0521  WBC 13.9*  --  13.0* 8.9  CREATININE 3.47*  --  2.99* 2.99*  LATICACIDVEN 3.6* 1.7 1.2  --     Estimated Creatinine Clearance: 27.4 mL/min (A) (by C-G formula based on SCr of 2.99 mg/dL (H)).    Antimicrobials this admission: meropenem 12/9 >>  vancomycin 12/9 >>   Microbiology results: 12/9 BCx: 4/4 MRSA 12/9 UCx: pending  12/9 SARS CoV-2: negative  12/9 MRSA PCR: positive  Thank you for allowing pharmacy to be a part of this patient's care.  Dallie Piles 05/12/2019 1:13 PM

## 2019-05-12 NOTE — Anesthesia Postprocedure Evaluation (Signed)
Anesthesia Post Note  Patient: DELOREAN KNUTZEN  Procedure(s) Performed: CYSTOSCOPY WITH URETEROSCOPY AND STENT PLACEMENT, LEFT (Left )  Patient location during evaluation: SICU Anesthesia Type: General Level of consciousness: sedated Pain management: pain level controlled Vital Signs Assessment: post-procedure vital signs reviewed and stable Respiratory status: patient remains intubated per anesthesia plan Cardiovascular status: stable Postop Assessment: no apparent nausea or vomiting Anesthetic complications: no     Last Vitals:  Vitals:   05/12/19 0500 05/12/19 0600  BP: 91/61 90/61  Pulse: 79 (!) 110  Resp: (!) 24 (!) 23  Temp:    SpO2: 96% 94%    Last Pain:  Vitals:   05/12/19 0400  TempSrc: Bladder  PainSc: 0-No pain                 Jamielyn Petrucci Lorenza Chick

## 2019-05-12 NOTE — Progress Notes (Signed)
Initial Nutrition Assessment  RD working remotely.  DOCUMENTATION CODES:   Not applicable  INTERVENTION:  If patient remains intubated >24-48 hours recommend initiating Vital AF 1.2 Cal at 40 mL/hr (960 mL goal daily volume) + Pro-Stat 30 mL TID per tube. Provides 1452 kcal, 117 grams of protein, 778 mL H2O daily. With current propofol rate provides 1996 kcal daily.  If tube feeds are initiated provide liquid MVI daily per tube and minimum free water flush of 30 mL Q4hrs to maintain tube patency.  NUTRITION DIAGNOSIS:   Inadequate oral intake related to inability to eat as evidenced by NPO status.  GOAL:   Provide needs based on ASPEN/SCCM guidelines  MONITOR:   Vent status, Labs, Weight trends, Skin, I & O's  REASON FOR ASSESSMENT:   Ventilator, Consult (assessment and recommendations for tube feeding)  ASSESSMENT:   63 year old male with PMHx of seizures, asthma, paranoid schizophrenia, hypothyroidism, depression, COPD, GERD, hd MI, hx CVA admitted with obstructing left ureteral calculus with solitary left kidney s/p left ureteral stent placement on 12/9, sepsis, acute on chronic renal insufficiency, and respiratory failure.   Patient intubated and sedated. On PRVC mode with FiO2 50% and PEEP 5 cmH2O.  Enteral Access: 18 Fr. NGT placed 12/9; terminates in upper stomach per chest x-ray 12/10; 55 cm at right nare  MAP: 69-85 mmHg  Patient is currently intubated on ventilator support Ve: 10.8 L/min Temp (24hrs), Avg:98.1 F (36.7 C), Min:96.1 F (35.6 C), Max:99.1 F (37.3 C)  Propofol: 20.6 mL/hr (544 kcal daily)  Medications reviewed and include: pantoprazole, LR at 75 mL/hr, meropenem, norepinephrine gtt at 5 mcg/min, propofol gtt, sodium bicarbonate 150 mEq in sterile water at 100 mL/hr, vancomycin.  Labs reviewed: CBG 135-142, CO2 18, BUN 49, Creatinine 2.99.  I/O: 900 mL UOP yesterday  NUTRITION - FOCUSED PHYSICAL EXAM:  Unable to complete at this  time.  Diet Order:   Diet Order            Diet NPO time specified  Diet effective now             EDUCATION NEEDS:   No education needs have been identified at this time  Skin:  Skin Assessment: Reviewed RN Assessment  Last BM:  Unknown/PTA  Height:   Ht Readings from Last 1 Encounters:  05/12/19 6' 0.01" (1.829 m)   Weight:   Wt Readings from Last 1 Encounters:  05/12/19 90.1 kg   Ideal Body Weight:  80.9 kg  BMI:  Body mass index is 26.93 kg/m.  Estimated Nutritional Needs:   Kcal:  2016 (PSU 2003b w/ MSJ 1733, Ve 10.8, Tmax 37.3)  Protein:  108-135 grams (1.2-1.5 grams/kg)  Fluid:  2-2.2 L/day  [name], MS, RD, LDN Office: [phone] Pager: [phone] After Hours/Weekend Pager: [phone]

## 2019-05-12 NOTE — Procedures (Signed)
Central Venous Catheter Insertion Procedure Note COLETON WOON [id number] May 23, 1956  Procedure: Insertion of Central Venous Catheter Indications: Assessment of intravascular volume, Drug and/or fluid administration and Frequent blood sampling  Procedure Details Consent: Unable to obtain consent because of emergent medical necessity. Time Out: Verified patient identification, verified procedure, site/side was marked, verified correct patient position, special equipment/implants available, medications/allergies/relevent history reviewed, required imaging and test results available.  Performed  Maximum sterile technique was used including antiseptics, cap, gloves, gown, hand hygiene, mask and sheet. Skin prep: Chlorhexidine; local anesthetic administered A antimicrobial bonded/coated triple lumen catheter was placed in the left internal jugular vein using the Seldinger technique.  Evaluation Blood flow good Complications: No apparent complications Patient did tolerate procedure well. Chest X-ray ordered to verify placement.  CXR: pending.   Procedure was performed using Ultrasound for direct visualization of cannulization of Left IJ.  Line was secured at the 20 cm mark.      Darel Hong, AGACNP-BC [facility] Pager: [phone]  Bradly Bienenstock 05/12/2019, 6:38 AM

## 2019-05-12 NOTE — Consult Note (Signed)
NAME: Alan Small  DOB: [birth year]  MRN: [id number]  Date/Time: 05/12/2019 3:19 PM  REQUESTING PROVIDER:Dr.Gonzalez Subjective:  REASON FOR CONSULT: MRSA bacteremia ?No History from patient as he is intubated . Chart reviewed in great detail. [facility] records reviewed Alan Small is a 63 y.o. male with  Single left kidney, stag horn calculus, obstructive uropathy admitted from Group home with sob and left flank pain  Pt has a complicated urological history . In Aug he was admitted with chills, fever and found to have left nephrolithiasis, obstructing left renal pelvic calculus, AKI . He underwent cystoscopy, dilatation of anterior urethral stricturand left ureteral stent placement by Dr.Stoioff on 01/21/19. Blood culture was neg, and urine culture was mixed species. After discharge from [facility] he was admitted to [facility] on 02/10/19 and was there until 03/02/19 During that hospitalization he was found to have pyelonephritis, nephrolithiasis, retroperitoneal abscess. Urinculturgrew MDR e.coli, Aspiration of abscess done by VIR on 02/17/19 and drain placement.The culture from the fluid was enterococcus fecalis. The drain  was removed on 9/26  ID was consulted and they recommended imipenem which he continued until Nov 14th [facility] urological proceduron 03/28/19.Cystoscopy, left ureteral stent removal, left retrogradpyelogram. 2. Left percutaneous nephrolithotomy stonburden greater than 2 cm. 3. Left renal tract access and dilation with supervision and interpretation. subcostal, infra-12th rib 4. Left antegrade nephrostogram through new access. 5. Left diagnostic renal ultrasound, with real timimaging documentation, limited study.  6. Left antegradureteral stent placement, 6-French x24 cm.  7. Supervision and interpretation of pyelograms and ureteral stent placement. 8. Left retrograde ureteroscopy with basket stonextraction and stonmanipulation.  9. Left percutaneous nephrostomy placement  On 11/3  lithotripsy and stent exchangand on  11/11 left ureteral stent removal. An order for removal of thpower linwas placed by Dr.Friendlander but thlinwas never removed  11/3 UC was enterococcus faecium but does not look likit was treated- The stone was also cultured and it was the same  Admitted to [facility] on 05/10/09 with tacypnea, left flank pain and subjectivfevers with nausea and vomiting. CT abdomen showed  Mild prominencof thleft ureter present abovthlevel of a partially obstructing 5 mm stone at pelvic inlet.He was started on vanco and meropenem He was seen by urologist and underwent cystoscopy, left ureteral stent placement  Blood culture positivfor MRSA and I am seeing the patient for the same Past Medical History:  Diagnosis Date  . Asthma   . COPD (chronic obstructive pulmonary disease) ([place])   . Depression   . External hemorrhoids   . Gastric ulcer   . GERD (gastroesophageal reflux disease)   . Hydronephrosis   . Hypothyroidism    hypo  . Insomnia   . Mental disorder   . Myocardial infarction ([place])   . Paranoid schizophrenia ([place])   . Renal disorder    renal failure  . Seizures ([place])   . Stroke [facility])     Past Surgical History:  Procedure Laterality Date  . APPENDECTOMY    . COLONOSCOPY WITH PROPOFOL N/A 12/07/2014   Procedure: COLONOSCOPY WITH PROPOFOL;  Surgeon: Manya Silvas, MD;  Location: [facility] ENDOSCOPY;  Service: Endoscopy;  Laterality: N/A;  . COLONOSCOPY WITH PROPOFOL N/A 10/10/2016   Procedure: COLONOSCOPY WITH PROPOFOL;  Surgeon: Lucilla Lame, MD;  Location: [place];  Service: Endoscopy;  Laterality: N/A;  . CYSTOSCOPY W/ URETERAL STENT PLACEMENT Left 01/21/2019   Procedure: CYSTOSCOPY WITH RETROGRADE PYELOGRAM/URETERAL STENT PLACEMENT;  Surgeon: Abbie Sons, MD;  Location: [facility] ORS;  Service: Urology;  Laterality: Left;  .  CYSTOSCOPY WITH URETEROSCOPY AND STENT PLACEMENT Left 05/11/2019   Procedure: CYSTOSCOPY WITH URETEROSCOPY AND STENT PLACEMENT,  LEFT;  Surgeon: Hollice Espy, MD;  Location: [facility] ORS;  Service: Urology;  Laterality: Left;  . ESOPHAGOGASTRODUODENOSCOPY N/A 12/07/2014   Procedure: ESOPHAGOGASTRODUODENOSCOPY (EGD);  Surgeon: Manya Silvas, MD;  Location: [facility] ENDOSCOPY;  Service: Endoscopy;  Laterality: N/A;  . NEPHRECTOMY    . POLYPECTOMY  10/10/2016   Procedure: POLYPECTOMY;  Surgeon: Lucilla Lame, MD;  Location: [place];  Service: Endoscopy;;  . SUPRAPUBIC CATHETER INSERTION      Social History   Socioeconomic History  . Marital status: Single    Spouse name: Not on file  . Number of children: Not on file  . Years of education: Not on file  . Highest education level: Not on file  Occupational History  . Not on file  Tobacco Use  . Smoking status: Current Every Day Smoker    Packs/day: 1.00    Years: 55.00    Pack years: 55.00    Types: Cigarettes  . Smokeless tobacco: Former Systems developer    Types: Chew  Substance and Sexual Activity  . Alcohol use: No  . Drug use: No  . Sexual activity: Not on file  Other Topics Concern  . Not on file  Social History Narrative  . Not on file   Social Determinants of Health   Financial Resource Strain:   . Difficulty of Paying Living Expenses: Not on file  Food Insecurity:   . Worried About Charity fundraiser in the Last Year: Not on file  . Ran Out of Food in the Last Year: Not on file  Transportation Needs:   . Lack of Transportation (Medical): Not on file  . Lack of Transportation (Non-Medical): Not on file  Physical Activity:   . Days of Exercise per Week: Not on file  . Minutes of Exercise per Session: Not on file  Stress:   . Feeling of Stress : Not on file  Social Connections:   . Frequency of Communication with Friends and Family: Not on file  . Frequency of Social Gatherings with Friends and Family: Not on file  . Attends Religious Services: Not on file  . Active Member of Clubs or Organizations: Not on file  . Attends Archivist  Meetings: Not on file  . Marital Status: Not on file  Intimate Partner Violence:   . Fear of Current or Ex-Partner: Not on file  . Emotionally Abused: Not on file  . Physically Abused: Not on file  . Sexually Abused: Not on file    Family History  Problem Relation Age of Onset  . Stroke Father   . Pneumonia Father   . Brain cancer Mother    Allergies  Allergen Reactions  . Mellaril [Thioridazine] Shortness Of Breath and Swelling  . Buprenorphine Itching  . Morphine And Related Itching  . Navane [Thiothixene] Other (See Comments)    Reaction:  GI upset   . Thorazine [Chlorpromazine] Other (See Comments)    Reaction:  Dizziness/fainting     ? Current Facility-Administered Medications  Medication Dose Route Frequency Provider Last Rate Last Admin  . acetaminophen (TYLENOL) tablet 650 mg  650 mg Oral Q4H PRN Tyler Pita, MD      . albuterol (PROVENTIL) (2.5 MG/3ML) 0.083% nebulizer solution 2.5 mg  2.5 mg Nebulization Q4H PRN Tyler Pita, MD      . chlorhexidine gluconate (MEDLINE KIT) (PERIDEX) 0.12 % solution 15  mL  15 mL Mouth Rinse BID Tyler Pita, MD   15 mL at 05/12/19 0800  . Chlorhexidine Gluconate Cloth 2 % PADS 6 each  6 each Topical Daily Bradly Bienenstock, NP   6 each at 05/12/19 [phone]  . fentaNYL 2531mg in NS 2554m(1068mml) infusion-PREMIX  0-400 mcg/hr Intravenous Continuous GonTyler PitaD 5 mL/hr at 05/11/19 1724 50 mcg/hr at 05/11/19 1724  . heparin injection 5,000 Units  5,000 Units Subcutaneous Q8H GonTyler PitaD   5,000 Units at 05/12/19 1347  . ipratropium-albuterol (DUONEB) 0.5-2.5 (3) MG/3ML nebulizer solution 3 mL  3 mL Nebulization Q6H GonTyler PitaD   3 mL at 05/12/19 1437  . lactated ringers infusion   Intravenous Continuous GonTyler PitaD 75 mL/hr at 05/12/19 1350 New Bag at 05/12/19 1350  . MEDLINE mouth rinse  15 mL Mouth Rinse 10 times per day GonTyler PitaD   15 mL at 05/12/19 1350  .  meropenem (MERREM) 500 mg in sodium chloride 0.9 % 100 mL IVPB  500 mg Intravenous Q12H GonVernard Gambles MD 200 mL/hr at 05/12/19 1347 500 mg at 05/12/19 1347  . midazolam (VERSED) injection 2 mg  2 mg Intravenous Q15 min PRN KeeBradly BienenstockP      . midazolam (VERSED) injection 2 mg  2 mg Intravenous Q2H PRN KeeBradly BienenstockP   2 mg at 05/12/19 0608  . norepinephrine (LEVOPHED) 16 mg in 250m20memix infusion  0-40 mcg/min Intravenous Titrated KeenDarel HongNP 4.69 mL/hr at 05/12/19 1000 5 mcg/min at 05/12/19 1000  . ondansetron (ZOFRAN) injection 4 mg  4 mg Intravenous Q6H PRN GonzTyler Pita      . pantoprazole (PROTONIX) injection 40 mg  40 mg Intravenous QHS GonzTyler Pita   40 mg at 05/11/19 2115  . propofol (DIPRIVAN) 1000 MG/100ML infusion  5-80 mcg/kg/min Intravenous Titrated GonzTyler Pita 20.6 mL/hr at 05/12/19 1346 40 mcg/kg/min at 05/12/19 1346  . sodium bicarbonate 150 mEq in sterile water 1,000 mL infusion   Intravenous Continuous KeenDarel HongNP 100 mL/hr at 05/12/19 1300 Rate Verify at 05/12/19 1300  . [START ON 05/13/2019] vancomycin (VANCOCIN) 1,250 mg in sodium chloride 0.9 % 250 mL IVPB  1,250 mg Intravenous Q36H GrubDallie PilesH         Abtx:  Anti-infectives (From admission, onward)   Start     Dose/Rate Route Frequency Ordered Stop   05/13/19 0000  vancomycin (VANCOCIN) 1,250 mg in sodium chloride 0.9 % 250 mL IVPB     1,250 mg 166.7 mL/hr over 90 Minutes Intravenous Every 36 hours 05/12/19 1334     05/12/19 0000  meropenem (MERREM) 500 mg in sodium chloride 0.9 % 100 mL IVPB     500 mg 200 mL/hr over 30 Minutes Intravenous Every 12 hours 05/11/19 2020     05/11/19 2345  vancomycin (VANCOCIN) 1,750 mg in sodium chloride 0.9 % 500 mL IVPB     1,750 mg 250 mL/hr over 120 Minutes Intravenous  Once 05/11/19 2326 05/12/19 0212   05/11/19 2326  vancomycin variable dose per unstable renal function (pharmacist dosing)  Status:   Discontinued      Does not apply See admin instructions 05/11/19 2326 05/12/19 1334   05/11/19 1145  meropenem (MERREM) 1 g in sodium chloride 0.9 % 100 mL IVPB     1 g 200 mL/hr over 30 Minutes Intravenous  Once 05/11/19 1132 05/11/19 1235      REVIEW OF SYSTEMS:  NA Objective:  VITALS:  BP (!) 90/57   Pulse 90   Temp 97.7 F (36.5 C) (Bladder)   Resp (!) 21   Ht 6' 0.01" (1.829 m)   Wt 90.1 kg   SpO2 96%   BMI 26.93 kg/m  PHYSICAL EXAM:  General: intubated and sedated  Head: Normocephalic, without obvious abnormality, atraumatic. Eyes: Conjunctiva clear, anicteric sclerae. Pupils arequal ENT cannot eamine Neck: Supple, symmetrical, no adenopathy, thyroid: non tender Left IJ line Back: did not examine Lungs: b/l air entry Heart: s1s2 Abdomen: Soft, non-tender,not distended. Extremities: ankle edema Skin: limited exam normal Rt chest wall- catheter removed Lymph: Cervical, supraclavicular normal. Neurologic:cannot be assessed Pertinent Labs Lab Results CBC    Component Value Date/Time   WBC 8.9 05/12/2019 0521   RBC 3.78 (L) 05/12/2019 0521   HGB 11.0 (L) 05/12/2019 0521   HGB 13.2 09/19/2014 1132   HCT 33.2 (L) 05/12/2019 0521   HCT 40.5 09/19/2014 1132   PLT 67 (L) 05/12/2019 0521   PLT 173 09/19/2014 1132   MCV 87.8 05/12/2019 0521   MCV 92 09/19/2014 1132   MCH 29.1 05/12/2019 0521   MCHC 33.1 05/12/2019 0521   RDW 14.3 05/12/2019 0521   RDW 15.8 (H) 09/19/2014 1132   LYMPHSABS 0.3 (L) 01/20/2019 0844   LYMPHSABS 1.4 01/15/2014 1845   MONOABS 0.4 01/20/2019 0844   MONOABS 1.5 (H) 01/15/2014 1845   EOSABS 0.0 01/20/2019 0844   EOSABS 0.3 01/15/2014 1845   BASOSABS 0.0 01/20/2019 0844   BASOSABS 0.1 01/15/2014 1845    CMP Latest Ref Rng & Units 05/12/2019 05/11/2019 05/11/2019  Glucose 70 - 99 mg/dL 152(H) - 152(H)  BUN 8 - 23 mg/dL 49(H) - 47(H)  Creatinine 0.61 - 1.24 mg/dL 2.99(H) 2.99(H) 3.47(H)  Sodium 135 - 145 mmol/L 138 - 133(L)    Potassium 3.5 - 5.1 mmol/L 3.9 - 3.9  Chloride 98 - 111 mmol/L 107 - 103  CO2 22 - 32 mmol/L 18(L) - 15(L)  Calcium 8.9 - 10.3 mg/dL 7.8(L) - 8.3(L)  Total Protein 6.5 - 8.1 g/dL - - 6.2(L)  Total Bilirubin 0.3 - 1.2 mg/dL - - 0.8  Alkaline Phos 38 - 126 U/L - - 80  AST 15 - 41 U/L - - 51(H)  ALT 0 - 44 U/L - - 43      Microbiology: Recent Results (from the past 240 hour(s))  Blood Culture (routine x 2)     Status: None (Preliminary result)   Collection Time: 05/11/19 11:14 AM   Specimen: BLOOD  Result Value Ref Range Status   Specimen Description BLOOD RIGHT ANTECUBITAL  Final   Special Requests   Final    BOTTLES DRAWN AEROBIC AND ANAEROBIC Blood Culture results may not be optimal due to an excessive volume of blood received in culture bottles   Culture  Setup Time   Final    GRAM POSITIVE COCCI IN BOTH AEROBIC AND ANAEROBIC BOTTLES CRITICAL VALUE NOTED.  VALUE IS CONSISTENT WITH PREVIOUSLY REPORTED AND CALLED VALUE. Performed at [facility], [address] [place], [place] [address]    Culture [facility] POSITIVE COCCI  Final   Report Status PENDING  Incomplete  Blood Culture (routine x 2)     Status: None (Preliminary result)   Collection Time: 05/11/19 11:14 AM   Specimen: BLOOD  Result Value Ref Range Status   Specimen Description BLOOD RIGHT CHEST  Final   Special Requests   Final    BOTTLES DRAWN AEROBIC AND ANAEROBIC Blood Culture adequate volume   Culture  Setup Time   Final    Organism ID to follow GRAM POSITIVE COCCI IN BOTH AEROBIC AND ANAEROBIC BOTTLES CRITICAL RESULT CALLED TO, READ BACK BY AND VERIFIED WITH: Hart Robinsons PHARMD 2302 05/11/2019 HNM Performed at [facility], [address] [place], [place] [address]    Culture [facility] POSITIVE COCCI  Final   Report Status PENDING  Incomplete  Blood Culture ID Panel (Reflexed)     Status: Abnormal   Collection Time: 05/11/19 11:14 AM  Result Value Ref Range Status   Enterococcus species NOT  DETECTED NOT DETECTED Final   Listeria monocytogenes NOT DETECTED NOT DETECTED Final   Staphylococcus species DETECTED (A) NOT DETECTED Final    Comment: CRITICAL RESULT CALLED TO, READ BACK BY AND VERIFIED WITH: SCOTT HALL PHARMD 2302 05/11/2019 HNM    Staphylococcus aureus (BCID) DETECTED (A) NOT DETECTED Final    Comment: Methicillin (oxacillin)-resistant Staphylococcus aureus (MRSA). MRSA is predictably resistant to beta-lactam antibiotics (except ceftaroline). Preferred therapy is vancomycin unless clinically contraindicated. Patient requires contact precautions if  hospitalized. CRITICAL RESULT CALLED TO, READ BACK BY AND VERIFIED WITH: SCOTT HALL PHARMD 2302 05/11/2019 HNM    Methicillin resistance DETECTED (A) NOT DETECTED Final    Comment: CRITICAL RESULT CALLED TO, READ BACK BY AND VERIFIED WITH: SCOTT HALL PHARMD 2302 05/11/2019 HNM    Streptococcus species NOT DETECTED NOT DETECTED Final   Streptococcus agalactiae NOT DETECTED NOT DETECTED Final   Streptococcus pneumoniae NOT DETECTED NOT DETECTED Final   Streptococcus pyogenes NOT DETECTED NOT DETECTED Final   Acinetobacter baumannii NOT DETECTED NOT DETECTED Final   Enterobacteriaceae species NOT DETECTED NOT DETECTED Final   Enterobacter cloacae complex NOT DETECTED NOT DETECTED Final   Escherichia coli NOT DETECTED NOT DETECTED Final   Klebsiella oxytoca NOT DETECTED NOT DETECTED Final   Klebsiella pneumoniae NOT DETECTED NOT DETECTED Final   Proteus species NOT DETECTED NOT DETECTED Final   Serratia marcescens NOT DETECTED NOT DETECTED Final   Haemophilus influenzae NOT DETECTED NOT DETECTED Final   Neisseria meningitidis NOT DETECTED NOT DETECTED Final   Pseudomonas aeruginosa NOT DETECTED NOT DETECTED Final   Candida albicans NOT DETECTED NOT DETECTED Final   Candida glabrata NOT DETECTED NOT DETECTED Final   Candida krusei NOT DETECTED NOT DETECTED Final   Candida parapsilosis NOT DETECTED NOT DETECTED Final    Candida tropicalis NOT DETECTED NOT DETECTED Final    Comment: Performed at [facility], [address] [place], [place] [address]  Respiratory Panel by RT PCR (Flu A&B, Covid) - Nasopharyngeal Swab     Status: None   Collection Time: 05/11/19 12:46 PM   Specimen: Nasopharyngeal Swab  Result Value Ref Range Status   SARS Coronavirus 2 by RT PCR NEGATIVE NEGATIVE Final    Comment: (NOTE) SARS-CoV-2 target nucleic acids are NOT DETECTED. The SARS-CoV-2 RNA is generally detectable in upper respiratoy specimens during the acute phase of infection. The lowest concentration of SARS-CoV-2 viral copies this assay can detect is 131 copies/mL. A negative result does not preclude SARS-Cov-2 infection and should not be used as the sole basis for treatment or other patient management decisions. A negative result may occur with  improper specimen collection/handling, submission of specimen other than nasopharyngeal swab, presence of viral mutation(s) within the areas targeted by this assay, and inadequate number of viral copies (<131 copies/mL). A  negative result must be combined with clinical observations, patient history, and epidemiological information. The expected result is Negative. Fact Sheet for Patients:  PinkCheek.be Fact Sheet for Healthcare Providers:  GravelBags.it This test is not yet ap proved or cleared by the [place] FDA and  has been authorized for detection and/or diagnosis of SARS-CoV-2 by FDA under an Emergency Use Authorization (EUA). This EUA will remain  in effect (meaning this test can be used) for the duration of the COVID-19 declaration under Section 564(b)(1) of the Act, 21 U.S.C. section 360bbb-3(b)(1), unless the authorization is terminated or revoked sooner.    Influenza A by PCR NEGATIVE NEGATIVE Final   Influenza B by PCR NEGATIVE NEGATIVE Final    Comment: (NOTE) The Xpert Xpress  SARS-CoV-2/FLU/RSV assay is intended as an aid in  the diagnosis of influenza from Nasopharyngeal swab specimens and  should not be used as a sole basis for treatment. Nasal washings and  aspirates are unacceptable for Xpert Xpress SARS-CoV-2/FLU/RSV  testing. Fact Sheet for Patients: PinkCheek.be Fact Sheet for Healthcare Providers: GravelBags.it This test is not yet approved or cleared by the [place] FDA and  has been authorized for detection and/or diagnosis of SARS-CoV-2 by  FDA under an Emergency Use Authorization (EUA). This EUA will remain  in effect (meaning this test can be used) for the duration of the  Covid-19 declaration under Section 564(b)(1) of the Act, 21  U.S.C. section 360bbb-3(b)(1), unless the authorization is  terminated or revoked. Performed at [facility], [address], [place], [place] [address]   Urine culture     Status: None   Collection Time: 05/11/19  5:06 PM   Specimen: In/Out Cath Urine  Result Value Ref Range Status   Specimen Description   Final    IN/OUT CATH URINE Performed at [facility], [address] [place], [place] [address]    Special Requests   Final    NONE Performed at [facility], [address], [place], [place] [address]    Culture   Final    NO GROWTH Performed at [facility], [place] [address] [place], [place] [address]    Report Status 05/12/2019 FINAL  Final  MRSA PCR Screening     Status: Abnormal   Collection Time: 05/11/19  5:52 PM   Specimen: Nasopharyngeal  Result Value Ref Range Status   MRSA by PCR POSITIVE (A) NEGATIVE Final    Comment:        The GeneXpert MRSA Assay (FDA approved for NASAL specimens only), is one component of a comprehensive MRSA colonization surveillance program. It is not intended to diagnose MRSA infection nor to guide or monitor treatment for MRSA infections. RESULT CALLED TO, READ  BACK BY AND VERIFIED WITH: ANN [facility] 05/11/19 @ 1916  [place] Performed at [facility], [address] [place], [place] [address]     IMAGING RESULTS:   05/12/19      I havpersonally reviewed the films ? Impression/Recommendation ? ?MRSA bacteremia- source is the power line which was placed at [facility] in Sept and was supposed to have been removed in [place] but missed- Line has been removed He has a new IJ line placed during the bacteremic period He will later need removal of the line and a culture before a new line can be placed for long term antibiotics Will change vanco to ceftaroline because of single kidney and CKD He will need TEE- need to r/o endocarditis  AKI  on CKD  Left ureteral  calculus causing partial obstruction and now has a stent  H/o left hydronephrosis, had nephrostomy, ureteral stent, retroperitoneal abscess  H/o ESBL e.coli pyelonephritis and H/o enterococcus fecalis retroperitoneal abscess- S/p drain placement at [facility] and received nearly 2 months of IV imipenem  Acute hypoxic resp failure- remains intubated after procedure ?  H/O Rt nephrectomy ___________________________________________________ Discussed with patient, requesting provider Note:  This document was prepared using Dragon voicrecognition softwarand may includunintentional dictation errors.

## 2019-05-12 NOTE — Progress Notes (Signed)
Patient with noted rhythm change and ST elevation per cardiac monitor.  12 lead EKG obtained and ST elevation noted in lead II, III, and AVF.  Dr. Patsey Berthold notified and order obtained for stat troponin.

## 2019-05-12 NOTE — Progress Notes (Signed)
*  PRELIMINARY RESULTS* Echocardiogram 2D Echocardiogram has been performed.  Alan Small 05/12/2019, 8:58 AM

## 2019-05-12 NOTE — Progress Notes (Signed)
Urology Inpatient Progress Note  Subjective: Alan Small is a 63 y.o. male admitted on 05/11/2019 with a partially obstructing 5 mm left ureteral calculus with a solitary left kidney, sepsis, respiratory failure, and acute on chronic renal insufficiency.  He is POD 1 from urgent left ureteral stent placement with Dr. Erlene Quan.  He remains intubated in the ICU.  Creatinine decreased since surgery, 2.99.  WBC count normalized 8.9 today.  He is afebrile and hypotensive to the 80s/50s.  Lactate normalized to 1.2.  Blood cultures significant for MRSA.  Urine culture pending.  On antibiotics as below.  Foley catheter in place draining clear, yellow urine.  Anti-infectives: Anti-infectives (From admission, onward)   Start     Dose/Rate Route Frequency Ordered Stop   05/12/19 0000  meropenem (MERREM) 500 mg in sodium chloride 0.9 % 100 mL IVPB     500 mg 200 mL/hr over 30 Minutes Intravenous Every 12 hours 05/11/19 2020     05/11/19 2345  vancomycin (VANCOCIN) 1,750 mg in sodium chloride 0.9 % 500 mL IVPB     1,750 mg 250 mL/hr over 120 Minutes Intravenous  Once 05/11/19 2326 05/12/19 0212   05/11/19 2326  vancomycin variable dose per unstable renal function (pharmacist dosing)      Does not apply See admin instructions 05/11/19 2326     05/11/19 1145  meropenem (MERREM) 1 g in sodium chloride 0.9 % 100 mL IVPB     1 g 200 mL/hr over 30 Minutes Intravenous  Once 05/11/19 1132 05/11/19 1235      Current Facility-Administered Medications  Medication Dose Route Frequency Provider Last Rate Last Admin  . acetaminophen (TYLENOL) tablet 650 mg  650 mg Oral Q4H PRN Tyler Pita, MD      . chlorhexidine gluconate (MEDLINE KIT) (PERIDEX) 0.12 % solution 15 mL  15 mL Mouth Rinse BID Tyler Pita, MD   15 mL at 05/11/19 2000  . Chlorhexidine Gluconate Cloth 2 % PADS 6 each  6 each Topical Daily Darel Hong D, NP      . fentaNYL 2565mg in NS 2584m(1018mml) infusion-PREMIX  0-400  mcg/hr Intravenous Continuous GonTyler PitaD 5 mL/hr at 05/11/19 1724 50 mcg/hr at 05/11/19 1724  . heparin injection 5,000 Units  5,000 Units Subcutaneous Q8H GonTyler PitaD   5,000 Units at 05/12/19 0536  . ipratropium-albuterol (DUONEB) 0.5-2.5 (3) MG/3ML nebulizer solution 3 mL  3 mL Nebulization Q6H GonTyler PitaD   3 mL at 05/12/19 0147  . lactated ringers infusion   Intravenous Continuous GonTyler PitaD 75 mL/hr at 05/12/19 0600 Rate Verify at 05/12/19 0600  . MEDLINE mouth rinse  15 mL Mouth Rinse 10 times per day GonTyler PitaD   15 mL at 05/12/19 0600  . meropenem (MERREM) 500 mg in sodium chloride 0.9 % 100 mL IVPB  500 mg Intravenous Q12H GonVernard Gambles MD 200 mL/hr at 05/12/19 0000 500 mg at 05/12/19 0000  . midazolam (VERSED) injection 2 mg  2 mg Intravenous Q15 min PRN KeeBradly BienenstockP      . midazolam (VERSED) injection 2 mg  2 mg Intravenous Q2H PRN KeeBradly BienenstockP   2 mg at 05/12/19 0608  . norepinephrine (LEVOPHED) 16 mg in 250m67memix infusion  0-40 mcg/min Intravenous Titrated KeenDarel HongNP 3.75 mL/hr at 05/12/19 0753 4 mcg/min at 05/12/19 0753  . ondansetron (ZOFRAN) injection 4 mg  4 mg Intravenous  Q6H PRN Tyler Pita, MD      . pantoprazole (PROTONIX) injection 40 mg  40 mg Intravenous QHS Tyler Pita, MD   40 mg at 05/11/19 2115  . propofol (DIPRIVAN) 1000 MG/100ML infusion  5-80 mcg/kg/min Intravenous Titrated Tyler Pita, MD 36 mL/hr at 05/12/19 0747 70 mcg/kg/min at 05/12/19 0747  . sodium bicarbonate 150 mEq in sterile water 1,000 mL infusion   Intravenous Continuous Darel Hong D, NP 100 mL/hr at 05/12/19 0600 Rate Verify at 05/12/19 0600  . vancomycin variable dose per unstable renal function (pharmacist dosing)   Does not apply See admin instructions Ena Dawley, RPH       Objective: Vital signs in last 24 hours: Temp:  [96.1 F (35.6 C)-99.1 F (37.3 C)] 98.6 F (37 C)  (12/10 0400) Pulse Rate:  [79-150] 110 (12/10 0600) Resp:  [16-42] 23 (12/10 0600) BP: (83-162)/(49-97) 90/61 (12/10 0600) SpO2:  [92 %-100 %] 94 % (12/10 0600) FiO2 (%):  [30 %-50 %] 50 % (12/10 0400) Weight:  [85.7 kg-90.1 kg] 90.1 kg (12/10 0433)  Intake/Output from previous day: 12/09 0701 - 12/10 0700 In: 6325.1 [I.V.:2524.9; IV Piggyback:3800.3] Out: 900 [Urine:900] Intake/Output this shift: No intake/output data recorded.  Physical Exam Vitals reviewed.  Constitutional:      Appearance: He is ill-appearing. He is not diaphoretic.     Interventions: He is intubated.  Pulmonary:     Effort: He is intubated.  Skin:    General: Skin is warm and dry.    Lab Results:  Recent Labs    05/11/19 1644 05/12/19 0521  WBC 13.0* 8.9  HGB 11.5* 11.0*  HCT 34.3* 33.2*  PLT 79* 67*   BMET Recent Labs    05/11/19 1047 05/11/19 1644 05/12/19 0521  NA 133*  --  138  K 3.9  --  3.9  CL 103  --  107  CO2 15*  --  18*  GLUCOSE 152*  --  152*  BUN 47*  --  49*  CREATININE 3.47* 2.99* 2.99*  CALCIUM 8.3*  --  7.8*   PT/INR Recent Labs    05/11/19 1047  LABPROT 15.5*  INR 1.2   ABG Recent Labs    05/11/19 1644 05/11/19 2000  PHART 7.17* 7.30*  HCO3 18.6* 17.7*   Assessment & Plan: 63 year old male s/p left ureteral stent placement in the setting of a 5 mm partially obstructing left ureteral stone with a solitary left kidney, also with sepsis, respiratory failure, and acute on chronic renal insufficiency.  Creatinine improving following stent placement, producing yellow urine.  He remains intubated and is unable to contribute to HPI.  Blood cultures significant for MRSA, urine cultures pending.  No new recommendations today.  We will continue to monitor.  Debroah Loop, PA-C 05/12/2019

## 2019-05-12 NOTE — Progress Notes (Signed)
Follow up - Critical Care Medicine Note  Patient Details:    Alan Small is an 63 y.o. male current smoker with a complex medical history and a solitary left kidney who underwent emergency ureteral stent placement for a partially obstructing ureteral calculus.  Patient has UTI.  Due to underlying COPD and COPD exacerbation the patient remain on the ventilator post procedure.  Lines, Airways, Drains: Airway 8 mm (Active)  Secured at (cm) 23 cm 05/12/19 2000  Measured From Lips 05/12/19 [place] 05/12/19 2000  Secured By [employer] 05/12/19 2000  Tube Holder Repositioned Yes 05/12/19 1610  Cuff Pressure (cm H2O) 28 cm H2O 05/12/19 1610  Site Condition Cool;Dry 05/12/19 2000     CVC Triple Lumen 05/12/19 Left Internal jugular (Active)  Indication for Insertion or Continuance of Line Vasoactive infusions 05/12/19 2000  Site Assessment Clean;Dry;Intact 05/12/19 2000  Proximal Lumen Status Infusing 05/12/19 2000  Medial Lumen Status Infusing 05/12/19 2000  Distal Lumen Status Infusing 05/12/19 2000  Dressing Type Transparent 05/12/19 2000  Dressing Status Clean;Dry;Intact 05/12/19 2000  Line Care Connections checked and tightened 05/12/19 2000  Dressing Intervention Other (Comment) 05/12/19 2000  Dressing Change Due 05/19/19 05/12/19 2000     NG/OG Tube Nasogastric 18 Fr. Right nare Xray Documented cm marking at nare/ corner of mouth 55 cm (Active)  Cm Marking at Nare/Corner of Mouth (if applicable) 55 cm 31/51/76 2000  Site Assessment Clean;Dry;Intact 05/12/19 2000  Ongoing Placement Verification No change in cm markings or external length of tube from initial placement;No change in respiratory status;No acute changes, not attributed to clinical condition;Xray 05/12/19 2000  Status Suction-low intermittent 05/12/19 2000  Drainage Appearance Brown 05/12/19 2000     Urethral Catheter Dr. Erlene Quan Latex;Temperature probe 14 Fr. (Active)  Indication for  Insertion or Continuance of Catheter Unstable critically ill patients first 24-48 hours (See Criteria) 05/12/19 2000  Site Assessment Intact;Clean 05/12/19 2000  Catheter Maintenance Bag below level of bladder;Catheter secured;Drainage bag/tubing not touching floor;Insertion date on drainage bag;No dependent loops;Seal intact 05/12/19 2000  Collection Container Standard drainage bag 05/12/19 2000  Securement Method Leg strap 05/12/19 2000  Urinary Catheter Interventions (if applicable) Unclamped 16/07/37 2000  Output (mL) 50 mL 05/12/19 2200     Suprapubic Catheter Latex 14 Fr. (Active)     Ureteral Drain/Stent Left ureter 6 Fr. (Active)     Ureteral Drain/Stent Left ureter 6 Fr. (Active)  Dressing Status Clean;Dry;Intact 05/12/19 0800  Output (mL) 400 mL 05/12/19 1800    Anti-infectives:  Anti-infectives (From admission, onward)   Start     Dose/Rate Route Frequency Ordered Stop   05/13/19 0000  vancomycin (VANCOCIN) 1,250 mg in sodium chloride 0.9 % 250 mL IVPB  Status:  Discontinued     1,250 mg 166.7 mL/hr over 90 Minutes Intravenous Every 36 hours 05/12/19 1334 05/12/19 1635   05/12/19 2200  ceftaroline (TEFLARO) 300 mg in sodium chloride 0.9 % 250 mL IVPB  Status:  Discontinued     300 mg 250 mL/hr over 60 Minutes Intravenous Every 12 hours 05/12/19 1636 05/12/19 1638   05/12/19 2200  ceftaroline (TEFLARO) 400 mg in sodium chloride 0.9 % 250 mL IVPB     400 mg 250 mL/hr over 60 Minutes Intravenous Every 12 hours 05/12/19 1638     05/12/19 0000  meropenem (MERREM) 500 mg in sodium chloride 0.9 % 100 mL IVPB  Status:  Discontinued     500 mg 200 mL/hr over 30 Minutes  Intravenous Every 12 hours 05/11/19 2020 05/12/19 1635   05/11/19 2345  vancomycin (VANCOCIN) 1,750 mg in sodium chloride 0.9 % 500 mL IVPB     1,750 mg 250 mL/hr over 120 Minutes Intravenous  Once 05/11/19 2326 05/12/19 0212   05/11/19 2326  vancomycin variable dose per unstable renal function (pharmacist dosing)   Status:  Discontinued      Does not apply See admin instructions 05/11/19 2326 05/12/19 1334   05/11/19 1145  meropenem (MERREM) 1 g in sodium chloride 0.9 % 100 mL IVPB     1 g 200 mL/hr over 30 Minutes Intravenous  Once 05/11/19 1132 05/11/19 1235      Microbiology: Results for orders placed or performed during the hospital encounter of 05/11/19  Blood Culture (routine x 2)     Status: None (Preliminary result)   Collection Time: 05/11/19 11:14 AM   Specimen: BLOOD  Result Value Ref Range Status   Specimen Description BLOOD RIGHT ANTECUBITAL  Final   Special Requests   Final    BOTTLES DRAWN AEROBIC AND ANAEROBIC Blood Culture results may not be optimal due to an excessive volume of blood received in culture bottles   Culture  Setup Time   Final    GRAM POSITIVE COCCI IN BOTH AEROBIC AND ANAEROBIC BOTTLES CRITICAL VALUE NOTED.  VALUE IS CONSISTENT WITH PREVIOUSLY REPORTED AND CALLED VALUE. Performed at [facility], [address] [place], [place] [address]    Culture [facility] POSITIVE COCCI  Final   Report Status PENDING  Incomplete  Blood Culture (routine x 2)     Status: None (Preliminary result)   Collection Time: 05/11/19 11:14 AM   Specimen: BLOOD  Result Value Ref Range Status   Specimen Description BLOOD RIGHT CHEST  Final   Special Requests   Final    BOTTLES DRAWN AEROBIC AND ANAEROBIC Blood Culture adequate volume   Culture  Setup Time   Final    Organism ID to follow GRAM POSITIVE COCCI IN BOTH AEROBIC AND ANAEROBIC BOTTLES CRITICAL RESULT CALLED TO, READ BACK BY AND VERIFIED WITH: Hart Robinsons [facility] 2302 05/11/2019 HNM Performed at [facility], [address], [place], [place] [address]    Culture [facility] POSITIVE COCCI  Final   Report Status PENDING  Incomplete  Blood Culture ID Panel (Reflexed)     Status: Abnormal   Collection Time: 05/11/19 11:14 AM  Result Value Ref Range Status   Enterococcus species NOT DETECTED NOT DETECTED Final    Listeria monocytogenes NOT DETECTED NOT DETECTED Final   Staphylococcus species DETECTED (A) NOT DETECTED Final    Comment: CRITICAL RESULT CALLED TO, READ BACK BY AND VERIFIED WITH: SCOTT HALL PHARMD 2302 05/11/2019 HNM    Staphylococcus aureus (BCID) DETECTED (A) NOT DETECTED Final    Comment: Methicillin (oxacillin)-resistant Staphylococcus aureus (MRSA). MRSA is predictably resistant to beta-lactam antibiotics (except ceftaroline). Preferred therapy is vancomycin unless clinically contraindicated. Patient requires contact precautions if  hospitalized. CRITICAL RESULT CALLED TO, READ BACK BY AND VERIFIED WITH: SCOTT HALL PHARMD 2302 05/11/2019 HNM    Methicillin resistance DETECTED (A) NOT DETECTED Final    Comment: CRITICAL RESULT CALLED TO, READ BACK BY AND VERIFIED WITH: SCOTT HALL PHARMD 2302 05/11/2019 HNM    Streptococcus species NOT DETECTED NOT DETECTED Final   Streptococcus agalactiae NOT DETECTED NOT DETECTED Final   Streptococcus pneumoniae NOT DETECTED NOT DETECTED Final   Streptococcus pyogenes NOT DETECTED NOT DETECTED Final   Acinetobacter baumannii NOT DETECTED NOT DETECTED  Final   Enterobacteriaceae species NOT DETECTED NOT DETECTED Final   Enterobacter cloacae complex NOT DETECTED NOT DETECTED Final   Escherichia coli NOT DETECTED NOT DETECTED Final   Klebsiella oxytoca NOT DETECTED NOT DETECTED Final   Klebsiella pneumoniae NOT DETECTED NOT DETECTED Final   Proteus species NOT DETECTED NOT DETECTED Final   Serratia marcescens NOT DETECTED NOT DETECTED Final   Haemophilus influenzae NOT DETECTED NOT DETECTED Final   Neisseria meningitidis NOT DETECTED NOT DETECTED Final   Pseudomonas aeruginosa NOT DETECTED NOT DETECTED Final   Candida albicans NOT DETECTED NOT DETECTED Final   Candida glabrata NOT DETECTED NOT DETECTED Final   Candida krusei NOT DETECTED NOT DETECTED Final   Candida parapsilosis NOT DETECTED NOT DETECTED Final   Candida tropicalis NOT DETECTED NOT  DETECTED Final    Comment: Performed at [facility], [address] [place], [place] [address]  Respiratory Panel by RT PCR (Flu A&B, Covid) - Nasopharyngeal Swab     Status: None   Collection Time: 05/11/19 12:46 PM   Specimen: Nasopharyngeal Swab  Result Value Ref Range Status   SARS Coronavirus 2 by RT PCR NEGATIVE NEGATIVE Final    Comment: (NOTE) SARS-CoV-2 target nucleic acids are NOT DETECTED. The SARS-CoV-2 RNA is generally detectable in upper respiratoy specimens during the acute phase of infection. The lowest concentration of SARS-CoV-2 viral copies this assay can detect is 131 copies/mL. A negative result does not preclude SARS-Cov-2 infection and should not be used as the sole basis for treatment or other patient management decisions. A negative result may occur with  improper specimen collection/handling, submission of specimen other than nasopharyngeal swab, presence of viral mutation(s) within the areas targeted by this assay, and inadequate number of viral copies (<131 copies/mL). A negative result must be combined with clinical observations, patient history, and epidemiological information. The expected result is Negative. Fact Sheet for Patients:  PinkCheek.be Fact Sheet for Healthcare Providers:  GravelBags.it This test is not yet ap proved or cleared by the [place] FDA and  has been authorized for detection and/or diagnosis of SARS-CoV-2 by FDA under an Emergency Use Authorization (EUA). This EUA will remain  in effect (meaning this test can be used) for the duration of the COVID-19 declaration under Section 564(b)(1) of the Act, 21 U.S.C. section 360bbb-3(b)(1), unless the authorization is terminated or revoked sooner.    Influenza A by PCR NEGATIVE NEGATIVE Final   Influenza B by PCR NEGATIVE NEGATIVE Final    Comment: (NOTE) The Xpert Xpress SARS-CoV-2/FLU/RSV assay is intended as an  aid in  the diagnosis of influenza from Nasopharyngeal swab specimens and  should not be used as a sole basis for treatment. Nasal washings and  aspirates are unacceptable for Xpert Xpress SARS-CoV-2/FLU/RSV  testing. Fact Sheet for Patients: PinkCheek.be Fact Sheet for Healthcare Providers: GravelBags.it This test is not yet approved or cleared by the [place] FDA and  has been authorized for detection and/or diagnosis of SARS-CoV-2 by  FDA under an Emergency Use Authorization (EUA). This EUA will remain  in effect (meaning this test can be used) for the duration of the  Covid-19 declaration under Section 564(b)(1) of the Act, 21  U.S.C. section 360bbb-3(b)(1), unless the authorization is  terminated or revoked. Performed at [facility], [address], [place], [place] [address]   Urine culture     Status: None   Collection Time: 05/11/19  5:06 PM   Specimen: In/Out Cath Urine  Result Value Ref  Range Status   Specimen Description   Final    IN/OUT CATH URINE Performed at [facility], [address], [place], [place] [address]    Special Requests   Final    NONE Performed at [facility], [address] [place], [place] [address]    Culture   Final    NO GROWTH Performed at [facility], [place] [address], [place], [place] [address]    Report Status 05/12/2019 FINAL  Final  MRSA PCR Screening     Status: Abnormal   Collection Time: 05/11/19  5:52 PM   Specimen: Nasopharyngeal  Result Value Ref Range Status   MRSA by PCR POSITIVE (A) NEGATIVE Final    Comment:        The GeneXpert MRSA Assay (FDA approved for NASAL specimens only), is one component of a comprehensive MRSA colonization surveillance program. It is not intended to diagnose MRSA infection nor to guide or monitor treatment for MRSA infections. RESULT CALLED TO, READ BACK BY AND VERIFIED WITH: ANN [facility]  05/11/19 @ 1916  [place] Performed at [facility], [address] [place],  [address]     Best Practice/Protocols:  VTE Prophylaxis: Heparin (drip) GI Prophylaxis: Proton Pump Inhibitor Continous Sedation  Events: 12/9 emergent ureteral stent placement, left due to obstructing stone.  Urinary tract infection.  Severe sepsis.  Intubated, mechanically ventilated 12/10 left IJ CVL placed due to hypotension, need for pressors.  MRSA detected in the blood.  Vancomycin added.   Studies: DG Chest Port 1 View  Result Date: 05/12/2019 CLINICAL DATA:  Central line placement. EXAM: PORTABLE CHEST 1 VIEW COMPARISON:  Radiograph earlier this day. FINDINGS: Left internal jugular central venous catheter tip projects over the upper SVC. No pneumothorax. Endotracheal tube tip at the thoracic inlet. The enteric tube is in place, tip below the diaphragm not included in the field of view, side-port in the region of the gastroesophageal junction. Low lung volumes. Unchanged heart size and mediastinal contours. Slight worsening interstitial thickening from prior exam. Increasing nodular airspace disease in the left suprahilar lung and left midlung zone. No definite pleural fluid. IMPRESSION: 1. Left internal jugular central venous catheter tip projects over the upper SVC. No pneumothorax. 2. Slight increase interstitial thickening superimposed on chronic bronchitic change, possible pulmonary edema. More nodular left lung airspace disease is also increasing suspicious for pneumonia. Electronically Signed   By: Keith Rake M.D.   On: 05/12/2019 07:02   DG Chest Port 1 View  Result Date: 05/12/2019 CLINICAL DATA:  Respiratory failure EXAM: PORTABLE CHEST 1 VIEW COMPARISON:  Yesterday FINDINGS: Endotracheal tube tip is just below the clavicular heads. The orogastric tube tip reaches the upper stomach. Artifact from EKG leads that is extensive. Borderline heart size accentuated by low volumes with  asymmetric right diaphragm elevation. Generalized interstitial coarsening and a nodular density over the left upper lobe which was not seen on recent priors. No effusion or pneumothorax. IMPRESSION: 1. Unremarkable hardware positioning. 2. Low volume chest with new nodular airspace opacity in the left upper lobe, possible early pneumonia. 3. Chronic bronchitic markings. Electronically Signed   By: Monte Fantasia M.D.   On: 05/12/2019 05:11   DG Chest Port 1 View  Result Date: 05/11/2019 CLINICAL DATA:  Respiratory failure EXAM: PORTABLE CHEST 1 VIEW COMPARISON:  Portable exam 1640 hours compared to 1037 hours FINDINGS: Tip of endotracheal tube projects 4.1 cm above carina. Nasogastric tube extends into stomach. Normal heart size, mediastinal contours,  and pulmonary vascularity. Bibasilar atelectasis and small LEFT pleural effusion. Diffuse accentuation of interstitial markings, could represent edema or infection. No pneumothorax or acute osseous findings. IMPRESSION: Bibasilar atelectasis and small LEFT pleural effusion with scattered interstitial prominence throughout both lungs question minimal pulmonary edema or infection. Electronically Signed   By: Lavonia Dana M.D.   On: 05/11/2019 16:59   DG Chest Port 1 View  Result Date: 05/11/2019 CLINICAL DATA:  Dyspnea. EXAM: PORTABLE CHEST 1 VIEW COMPARISON:  01/22/2019 FINDINGS: Right IJ Port-A-Cath with tip over the SVC. Lungs are hypoinflated with mild prominence of the central pulmonary vessels suggesting mild vascular congestion. No lobar consolidation or effusion. There is subtle patchy density in the mid lungs as could not exclude possible underlying infection. No pneumothorax. Cardiomediastinal silhouette and remainder of the exam is unchanged. IMPRESSION: Findings suggesting mild vascular congestion. Subtle patchy density in the mid lungs which could represent underlying infection. Right IJ Port-A-Cath with tip over the SVC. Electronically Signed   By:  Marin Olp M.D.   On: 05/11/2019 11:10   DG OR UROLOGY CYSTO IMAGE ([place])  Result Date: 05/11/2019 There is no interpretation for this exam.  This order is for images obtained during a surgical procedure.  Please See "Surgeries" Tab for more information regarding the procedure.   ECHOCARDIOGRAM COMPLETE  Result Date: 05/12/2019   ECHOCARDIOGRAM REPORT   Patient Name:   OSHEA PERCIVAL Date of Exam: 05/12/2019 Medical Rec #:  [id number]      Height:       72.0 in Accession #:    [id number]     Weight:       198.6 lb Date of Birth:  May 06, 1956      BSA:          2.12 m Patient Age:    75 years       BP:           109/60 mmHg Patient Gender: M              HR:           87 bpm. Exam Location:  [facility] Procedure: 2D Echo, Color Doppler and Cardiac Doppler Indications:     R78.81 Bacteremia  History:         Patient has no prior history of Echocardiogram examinations.                  Previous Myocardial Infarction; Stroke and COPD. Schizophrenia.  Sonographer:     Charmayne Sheer RDCS (AE) Referring Phys:  [id number] Bradly Bienenstock Diagnosing Phys: Ida Rogue MD  Sonographer Comments: Echo performed with patient supine and on artificial respirator and suboptimal parasternal window. Image acquisition challenging due to COPD. IMPRESSIONS  1. Left ventricular ejection fraction, by visual estimation, is 60 to 65%. The left ventricle has normal function. There is no left ventricular hypertrophy.  2. Left ventricular diastolic parameters are consistent with Grade I diastolic dysfunction (impaired relaxation).  3. The left ventricle has no regional wall motion abnormalities.  4. Global right ventricle has normal systolic function.The right ventricular size is normal. No increase in right ventricular wall thickness.  5. Left atrial size was normal.  6. TR signal is inadequate for assessing pulmonary artery systolic pressure.  7. No valve vegetation noted FINDINGS  Left Ventricle: Left ventricular ejection fraction, by  visual estimation, is 60 to 65%. The left ventricle has normal function. The left ventricle has no regional wall motion abnormalities. There is no  left ventricular hypertrophy. Left ventricular diastolic parameters are consistent with Grade I diastolic dysfunction (impaired relaxation). Normal left atrial pressure. Right Ventricle: The right ventricular size is normal. No increase in right ventricular wall thickness. Global RV systolic function is has normal systolic function. Left Atrium: Left atrial size was normal in size. Right Atrium: Right atrial size was normal in size Pericardium: There is no evidence of pericardial effusion. Mitral Valve: The mitral valve is normal in structure. No evidence of mitral valve regurgitation. No evidence of mitral valve stenosis by observation. MV peak gradient, 4.5 mmHg. Tricuspid Valve: The tricuspid valve is normal in structure. Tricuspid valve regurgitation is not demonstrated. Aortic Valve: The aortic valve was not well visualized. Aortic valve regurgitation is not visualized. Aortic regurgitation PHT measures 903 msec. The aortic valve is structurally normal, with no evidence of sclerosis or stenosis. Aortic valve mean gradient measures 7.0 mmHg. Aortic valve peak gradient measures 11.7 mmHg. Aortic valve area, by VTI measures 1.79 cm. Pulmonic Valve: The pulmonic valve was normal in structure. Pulmonic valve regurgitation is not visualized. Pulmonic regurgitation is not visualized. Aorta: The aortic root, ascending aorta and aortic arch are all structurally normal, with no evidence of dilitation or obstruction. Venous: The inferior vena cava is normal in size with greater than 50% respiratory variability, suggesting right atrial pressure of 3 mmHg. IAS/Shunts: No atrial level shunt detected by color flow Doppler. There is no evidence of a patent foramen ovale. No ventricular septal defect is seen or detected. There is no evidence of an atrial septal defect.  LEFT  VENTRICLE PLAX 2D LVIDd:         4.70 cm  Diastology LVIDs:         2.59 cm  LV e' lateral:   5.98 cm/s LV PW:         0.82 cm  LV E/e' lateral: 12.4 LV IVS:        0.66 cm  LV e' medial:    6.74 cm/s LVOT diam:     1.70 cm  LV E/e' medial:  11.0 LV SV:         78 ml LV SV Index:   36.23 LVOT Area:     2.27 cm  RIGHT VENTRICLE RV Basal diam:  2.93 cm LEFT ATRIUM           Index       RIGHT ATRIUM           Index LA diam:      2.90 cm 1.37 cm/m  RA Area:     10.00 cm LA Vol (A4C): 30.1 ml 14.17 ml/m RA Volume:   19.50 ml  9.18 ml/m  AORTIC VALVE                    PULMONIC VALVE AV Area (Vmax):    1.69 cm     PV Vmax:       0.94 m/s AV Area (Vmean):   1.65 cm     PV Vmean:      61.800 cm/s AV Area (VTI):     1.79 cm     PV VTI:        0.171 m AV Vmax:           171.00 cm/s  PV Peak grad:  3.5 mmHg AV Vmean:          122.000 cm/s PV Mean grad:  2.0 mmHg AV VTI:  0.299 m AV Peak Grad:      11.7 mmHg AV Mean Grad:      7.0 mmHg LVOT Vmax:         127.00 cm/s LVOT Vmean:        88.600 cm/s LVOT VTI:          0.236 m LVOT/AV VTI ratio: 0.79 AI PHT:            903 msec  AORTA Ao Root diam: 3.30 cm MITRAL VALVE MV Area (PHT): 4.80 cm             SHUNTS MV Peak grad:  4.5 mmHg             Systemic VTI:  0.24 m MV Mean grad:  2.0 mmHg             Systemic Diam: 1.70 cm MV Vmax:       1.06 m/s MV Vmean:      61.5 cm/s MV VTI:        0.19 m MV PHT:        45.82 msec MV Decel Time: 158 msec MV E velocity: 74.20 cm/s 103 cm/s MV A velocity: 98.50 cm/s 70.3 cm/s MV E/A ratio:  0.75       1.5  Ida Rogue MD Electronically signed by Ida Rogue MD Signature Date/Time: 05/12/2019/7:11:57 PM    Final    CT Renal Stone Study  Result Date: 05/11/2019 CLINICAL DATA:  Flank pain. Recurrent disease suspected. EXAM: CT ABDOMEN AND PELVIS WITHOUT CONTRAST TECHNIQUE: Multidetector CT imaging of the abdomen and pelvis was performed following the standard protocol without IV contrast. COMPARISON:  CT of the abdomen  and pelvis without contrast 3/20/. FINDINGS: Lower chest: Patchy airspace opacities are present in the lower lobes bilaterally. Heart size is normal. No significant pleural or pericardial effusion present. Hepatobiliary: Hepatic cysts are stable. No new lesions are present. The largest lesion laterally in inferior right lower lobe measuring 18 mm. The common bile duct and gallbladder are normal. Pancreas: Unremarkable. No pancreatic ductal dilatation or surrounding inflammatory changes. Spleen: Normal in size without focal abnormality. Adrenals/Urinary Tract: Adrenal glands are normal bilaterally. Right nephrectomy noted. Prominent staghorn calculus seen on the prior study is near completely resolved. Prominent parenchymal calcification in the midportion of the left kidney measures 11 mm. Punctate calcification near the lower pole of the left kidney is stable. Mild prominence of the left ureter present above the level of a partially obstructing 5 mm stone at pelvic inlet. More distal ureter is normal. The urinary bladder is within limits. Stomach/Bowel: Stomach and duodenum are within normal limits. Bowel is unremarkable. Terminal ileum is normal. The ascending and transverse colon are within normal limits. Descending and sigmoid colon are normal. Vascular/Lymphatic: Atherosclerotic calcifications are present in the aorta and branch vessels without aneurysm. Reproductive: Dense calcifications are present prostate gland. No discrete lesion is present. Other: Choose 1 Musculoskeletal: No acute or significant osseous findings. IMPRESSION: 1. Partially obstructing 5 mm stone at the left pelvic inlet. 2. Mild prominence of the left ureter above the level the stone. 3. Marked reduction in stone burden in the left kidney. 4. Right nephrectomy. 5. Stable hepatic cysts. 6. Aortic Atherosclerosis (ICD10-I70.0). 7. Patchy airspace disease the lung bases bilaterally raises concern for infection. Electronically Signed   By:  San Morelle M.D.   On: 05/11/2019 11:52    Consults: Treatment Team:  Hollice Espy, MD   Subjective:    Overnight Issues: Developed  hypotension early this a.m.  Require central line placement as noted above.  Had some inferior lead ST elevation on ECG.  Troponin normal.  Objective:  Vital signs for last 24 hours: Temp:  [96.1 F (35.6 C)-98.6 F (37 C)] 98.4 F (36.9 C) (12/10 2000) Pulse Rate:  [72-113] 93 (12/10 2200) Resp:  [0-26] 22 (12/10 2200) BP: (80-112)/(55-75) 98/60 (12/10 2200) SpO2:  [89 %-98 %] 96 % (12/10 2200) FiO2 (%):  [40 %-50 %] 40 % (12/10 2000) Weight:  [90.1 kg] 90.1 kg (12/10 0433)  Hemodynamic parameters for last 24 hours:    Intake/Output from previous day: 12/09 0701 - 12/10 0700 In: 6325.1 [I.V.:2524.9; IV Piggyback:3800.3] Out: 900 [Urine:900]  Intake/Output this shift: Total I/O In: 761 [I.V.:761] Out: 150 [Urine:150]  Vent settings for last 24 hours: Vent Mode: PRVC FiO2 (%):  [40 %-50 %] 40 % Set Rate:  [22 bmp] 22 bmp Vt Set:  [500 mL] 500 mL PEEP:  [5 cmH20] 5 cmH20  Physical Exam:  Constitutional:He is sedated and intubated.   Diaphoretic. Chronically ill-appearing, appears older than stated age.   HENT:  Head: Normocephalic and atraumatic.  Orotracheally intubated.  Oral mucosa moist.  Eyes: Pupils are equal, round, and reactive to light. Conjunctivae are normal. No scleral icterus.  Neck: No JVD present. No tracheal deviation present. No thyromegaly present.  Cardiovascular: Normal rate and regular rhythm.  Respiratory: He is intubated.  Rhonchi noted, some mild wheezing.[name] He has no rales.  Synchronous with the ventilator  GI: Soft. Bowel sounds are normal. He exhibits no distension.  Musculoskeletal:No deformity or edema.  Neurological:  Sedated no further assessment can be made  Skin: Skin is warm and dry.  Psychiatric:  Cannot make assessment, sedated.    Chest x-ray reviewed independently.  All lab data  reviewed.  Medications reviewed with Pharm.D.   Assessment/Plan:  Acute respiratory failure due to: COPD exacerbation in the setting of: Severe sepsis Continue ventilator support, discussed with RT Bronchodilator treatments No steroids currently  Pulmonary toilet Antibiotic therapy SAT/SBT as able   Severe sepsis with septic shock Obstructing renal calculus Urinary tract infection Known history of ESBL MRSA bacteremia Continue volume resuscitation/support Underwent emergent ureteral stent placement On meropenem Follow culture results ID consultation for MRSA bacteremia 2D echo has been ordered and pending may need TEE Repeat blood cultures Appropriate isolation for ESBL history and now MRSA Periodic check on chest x-ray as does have some nodular infiltrates left upper lobe may indicate septic emboli/early cavitating lesions.  ST changes on inferior leads May be demand ischemia Heparin changed to heparin infusion Cycle troponins Cardiac monitoring   Acute on chronic renal failure Solitary kidney Avoid nephrotoxins Maintain perfusion, avoid hypotension Volume resuscitate Discontinued sodium bicarb infusion Pharmacy to replace electrolytes Dose antibiotics according to renal function  Paranoid schizophrenia This issue adds complexity to his management Resume home medications as soon as feasible  DVT prophylaxis:heparin SQ switched to heparin infusion GI prophylax:PPI    LOS: 1 day   Additional comments:No family available for update.  Critical Care Total Time*: 40 Minutes  C. Derrill Kay, MD [facility] 05/12/2019  *Care during the described time interval was provided by me and/or other providers on the critical care team.  I have reviewed this patient's available data, including medical history, events of note, physical examination and test results as part of my evaluation.  **This note was dictated using voice recognition software/Dragon.   Despite best efforts to proofread, errors can occur which  can change the meaning.  Any change was purely unintentional.

## 2019-05-13 ENCOUNTER — Encounter: Payer: Self-pay | Admitting: Pulmonary Disease

## 2019-05-13 DIAGNOSIS — N189 Chronic kidney disease, unspecified: Secondary | ICD-10-CM

## 2019-05-13 LAB — CBC
HCT: 30.1 % — ABNORMAL LOW (ref 39.0–52.0)
Hemoglobin: 9.9 g/dL — ABNORMAL LOW (ref 13.0–17.0)
MCH: 29.6 pg (ref 26.0–34.0)
MCHC: 32.9 g/dL (ref 30.0–36.0)
MCV: 89.9 fL (ref 80.0–100.0)
Platelets: 73 10*3/uL — ABNORMAL LOW (ref 150–400)
RBC: 3.35 MIL/uL — ABNORMAL LOW (ref 4.22–5.81)
RDW: 14.3 % (ref 11.5–15.5)
WBC: 10.4 10*3/uL (ref 4.0–10.5)
nRBC: 0 % (ref 0.0–0.2)

## 2019-05-13 LAB — BASIC METABOLIC PANEL
Anion gap: 10 (ref 5–15)
BUN: 55 mg/dL — ABNORMAL HIGH (ref 8–23)
CO2: 26 mmol/L (ref 22–32)
Calcium: 7.9 mg/dL — ABNORMAL LOW (ref 8.9–10.3)
Chloride: 104 mmol/L (ref 98–111)
Creatinine, Ser: 2.98 mg/dL — ABNORMAL HIGH (ref 0.61–1.24)
GFR calc Af Amer: 25 mL/min — ABNORMAL LOW (ref >60)
GFR calc non Af Amer: 21 mL/min — ABNORMAL LOW (ref >60)
Glucose, Bld: 138 mg/dL — ABNORMAL HIGH (ref 70–99)
Potassium: 3.7 mmol/L (ref 3.5–5.1)
Sodium: 140 mmol/L (ref 135–145)

## 2019-05-13 LAB — GLUCOSE, CAPILLARY
Glucose-Capillary: 106 mg/dL — ABNORMAL HIGH (ref 70–99)
Glucose-Capillary: 107 mg/dL — ABNORMAL HIGH (ref 70–99)
Glucose-Capillary: 112 mg/dL — ABNORMAL HIGH (ref 70–99)
Glucose-Capillary: 130 mg/dL — ABNORMAL HIGH (ref 70–99)
Glucose-Capillary: 133 mg/dL — ABNORMAL HIGH (ref 70–99)
Glucose-Capillary: 143 mg/dL — ABNORMAL HIGH (ref 70–99)

## 2019-05-13 LAB — TROPONIN I (HIGH SENSITIVITY): Troponin I (High Sensitivity): 12 ng/L (ref <18)

## 2019-05-13 MED ORDER — LORAZEPAM 2 MG/ML IJ SOLN
INTRAMUSCULAR | Status: AC
  Administered 2019-05-13: 1 mg via INTRAVENOUS
  Filled 2019-05-13: qty 1

## 2019-05-13 MED ORDER — PNEUMOCOCCAL VAC POLYVALENT 25 MCG/0.5ML IJ INJ
0.5000 mL | INJECTION | INTRAMUSCULAR | Status: AC
  Administered 2019-05-14: 0.5 mL via INTRAMUSCULAR
  Filled 2019-05-13: qty 0.5

## 2019-05-13 MED ORDER — LORAZEPAM 2 MG/ML IJ SOLN: 1.0000 mg | Freq: Once | INTRAMUSCULAR | Status: AC

## 2019-05-13 MED ORDER — DEXMEDETOMIDINE HCL IN NACL 400 MCG/100ML IV SOLN
0.4000 ug/kg/h | INTRAVENOUS | Status: DC
  Administered 2019-05-13 – 2019-05-14 (×4): 1.2 ug/kg/h via INTRAVENOUS
  Administered 2019-05-14: 0.8 ug/kg/h via INTRAVENOUS
  Administered 2019-05-15 (×3): 1 ug/kg/h via INTRAVENOUS
  Administered 2019-05-15 (×2): 1.2 ug/kg/h via INTRAVENOUS
  Administered 2019-05-16: 0.4 ug/kg/h via INTRAVENOUS
  Administered 2019-05-16 (×2): 0.8 ug/kg/h via INTRAVENOUS
  Filled 2019-05-13 (×19): qty 100

## 2019-05-13 MED ORDER — PALIPERIDONE PALMITATE ER 156 MG/ML IM SUSY
156.0000 mg | PREFILLED_SYRINGE | Freq: Once | INTRAMUSCULAR | Status: AC
  Administered 2019-05-13: 22:00:00 156 mg via INTRAMUSCULAR
  Filled 2019-05-13: qty 1

## 2019-05-13 MED ORDER — NICOTINE 21 MG/24HR TD PT24
21.0000 mg | MEDICATED_PATCH | Freq: Every day | TRANSDERMAL | Status: DC
  Administered 2019-05-13 – 2019-05-18 (×6): 21 mg via TRANSDERMAL
  Filled 2019-05-13 (×10): qty 1

## 2019-05-13 MED ORDER — PALIPERIDONE PALMITATE ER 156 MG/ML IM SUSY
156.0000 mg | PREFILLED_SYRINGE | Freq: Once | INTRAMUSCULAR | Status: DC
  Filled 2019-05-13 (×3): qty 1

## 2019-05-13 MED ORDER — MUPIROCIN 2 % EX OINT
1.0000 "application " | TOPICAL_OINTMENT | Freq: Two times a day (BID) | CUTANEOUS | Status: AC
  Administered 2019-05-13 – 2019-05-17 (×10): 1 via NASAL
  Filled 2019-05-13: qty 22

## 2019-05-13 NOTE — Progress Notes (Signed)
Follow up - Critical Care Medicine Note  Patient Details:    Alan Small is an 63 y.o. male current smoker with a complex medical history and a solitary left kidney who underwent emergency ureteral stent placement for a partially obstructing ureteral calculus.  Patient has UTI.  Due to underlying COPD and COPD exacerbation the patient remain on the ventilator post procedure.  Lines, Airways, Drains: Airway 8 mm (Active)  Secured at (cm) 23 cm 05/12/19 2000  Measured From Lips 05/12/19 [place] 05/12/19 2000  Secured By [employer] 05/12/19 2000  Tube Holder Repositioned Yes 05/12/19 1610  Cuff Pressure (cm H2O) 28 cm H2O 05/12/19 1610  Site Condition Cool;Dry 05/12/19 2000     CVC Triple Lumen 05/12/19 Left Internal jugular (Active)  Indication for Insertion or Continuance of Line Vasoactive infusions 05/12/19 2000  Site Assessment Clean;Dry;Intact 05/12/19 2000  Proximal Lumen Status Infusing 05/12/19 2000  Medial Lumen Status Infusing 05/12/19 2000  Distal Lumen Status Infusing 05/12/19 2000  Dressing Type Transparent 05/12/19 2000  Dressing Status Clean;Dry;Intact 05/12/19 2000  Line Care Connections checked and tightened 05/12/19 2000  Dressing Intervention Other (Comment) 05/12/19 2000  Dressing Change Due 05/19/19 05/12/19 2000     NG/OG Tube Nasogastric 18 Fr. Right nare Xray Documented cm marking at nare/ corner of mouth 55 cm (Active)  Cm Marking at Nare/Corner of Mouth (if applicable) 55 cm 85/27/78 2000  Site Assessment Clean;Dry;Intact 05/12/19 2000  Ongoing Placement Verification No change in cm markings or external length of tube from initial placement;No change in respiratory status;No acute changes, not attributed to clinical condition;Xray 05/12/19 2000  Status Suction-low intermittent 05/12/19 2000  Drainage Appearance Brown 05/12/19 2000     Urethral Catheter Dr. Erlene Quan Latex;Temperature probe 14 Fr. (Active)  Indication for  Insertion or Continuance of Catheter Unstable critically ill patients first 24-48 hours (See Criteria) 05/12/19 2000  Site Assessment Intact;Clean 05/12/19 2000  Catheter Maintenance Bag below level of bladder;Catheter secured;Drainage bag/tubing not touching floor;Insertion date on drainage bag;No dependent loops;Seal intact 05/12/19 2000  Collection Container Standard drainage bag 05/12/19 2000  Securement Method Leg strap 05/12/19 2000  Urinary Catheter Interventions (if applicable) Unclamped 24/23/53 2000  Output (mL) 50 mL 05/12/19 2200     Suprapubic Catheter Latex 14 Fr. (Active)     Ureteral Drain/Stent Left ureter 6 Fr. (Active)     Ureteral Drain/Stent Left ureter 6 Fr. (Active)  Dressing Status Clean;Dry;Intact 05/12/19 0800  Output (mL) 400 mL 05/12/19 1800    Anti-infectives:  Anti-infectives (From admission, onward)   Start     Dose/Rate Route Frequency Ordered Stop   05/13/19 0000  vancomycin (VANCOCIN) 1,250 mg in sodium chloride 0.9 % 250 mL IVPB  Status:  Discontinued     1,250 mg 166.7 mL/hr over 90 Minutes Intravenous Every 36 hours 05/12/19 1334 05/12/19 1635   05/12/19 2200  ceftaroline (TEFLARO) 300 mg in sodium chloride 0.9 % 250 mL IVPB  Status:  Discontinued     300 mg 250 mL/hr over 60 Minutes Intravenous Every 12 hours 05/12/19 1636 05/12/19 1638   05/12/19 2200  ceftaroline (TEFLARO) 400 mg in sodium chloride 0.9 % 250 mL IVPB     400 mg 250 mL/hr over 60 Minutes Intravenous Every 12 hours 05/12/19 1638     05/12/19 0000  meropenem (MERREM) 500 mg in sodium chloride 0.9 % 100 mL IVPB  Status:  Discontinued     500 mg 200 mL/hr over 30 Minutes  Intravenous Every 12 hours 05/11/19 2020 05/12/19 1635   05/11/19 2345  vancomycin (VANCOCIN) 1,750 mg in sodium chloride 0.9 % 500 mL IVPB     1,750 mg 250 mL/hr over 120 Minutes Intravenous  Once 05/11/19 2326 05/12/19 0212   05/11/19 2326  vancomycin variable dose per unstable renal function (pharmacist dosing)   Status:  Discontinued      Does not apply See admin instructions 05/11/19 2326 05/12/19 1334   05/11/19 1145  meropenem (MERREM) 1 g in sodium chloride 0.9 % 100 mL IVPB     1 g 200 mL/hr over 30 Minutes Intravenous  Once 05/11/19 1132 05/11/19 1235      Microbiology: Results for orders placed or performed during the hospital encounter of 05/11/19  Blood Culture (routine x 2)     Status: Abnormal (Preliminary result)   Collection Time: 05/11/19 11:14 AM   Specimen: BLOOD  Result Value Ref Range Status   Specimen Description   Final    BLOOD RIGHT ANTECUBITAL Performed at [facility], [address] [place], [place] [address]    Special Requests   Final    BOTTLES DRAWN AEROBIC AND ANAEROBIC Blood Culture results may not be optimal due to an excessive volume of blood received in culture bottles Performed at [facility], [address] [place], [place] [address]    Culture  Setup Time   Final    GRAM POSITIVE COCCI IN BOTH AEROBIC AND ANAEROBIC BOTTLES CRITICAL VALUE NOTED.  VALUE IS CONSISTENT WITH PREVIOUSLY REPORTED AND CALLED VALUE. Performed at [facility], [address], [place], [place] [address]    Culture (A)  Final    STAPHYLOCOCCUS AUREUS SUSCEPTIBILITIES TO FOLLOW Performed at [facility], [place] [address], [place], [place] [address]    Report Status PENDING  Incomplete  Blood Culture (routine x 2)     Status: Abnormal (Preliminary result)   Collection Time: 05/11/19 11:14 AM   Specimen: BLOOD  Result Value Ref Range Status   Specimen Description   Final    BLOOD RIGHT CHEST Performed at [facility], [address] [place], [place] [address]    Special Requests   Final    BOTTLES DRAWN AEROBIC AND ANAEROBIC Blood Culture adequate volume Performed at [facility], [address], [place], [place] [address]    Culture  Setup Time   Final    GRAM POSITIVE COCCI IN BOTH AEROBIC AND ANAEROBIC  BOTTLES CRITICAL RESULT CALLED TO, READ BACK BY AND VERIFIED WITH: Hart Robinsons PHARMD 2302 05/11/2019 HNM    Culture (A)  Final    STAPHYLOCOCCUS AUREUS SUSCEPTIBILITIES TO FOLLOW Performed at [facility], [place] [address] [place], [place] [address]    Report Status PENDING  Incomplete  Blood Culture ID Panel (Reflexed)     Status: Abnormal   Collection Time: 05/11/19 11:14 AM  Result Value Ref Range Status   Enterococcus species NOT DETECTED NOT DETECTED Final   Listeria monocytogenes NOT DETECTED NOT DETECTED Final   Staphylococcus species DETECTED (A) NOT DETECTED Final    Comment: CRITICAL RESULT CALLED TO, READ BACK BY AND VERIFIED WITH: SCOTT HALL PHARMD 2302 05/11/2019 HNM    Staphylococcus aureus (BCID) DETECTED (A) NOT DETECTED Final    Comment: Methicillin (oxacillin)-resistant Staphylococcus aureus (MRSA). MRSA is predictably resistant to beta-lactam antibiotics (except ceftaroline). Preferred therapy is vancomycin unless clinically contraindicated. Patient requires contact precautions if  hospitalized. CRITICAL RESULT CALLED TO, READ BACK BY  AND VERIFIED WITH: Hart Robinsons PHARMD [id number] 05/11/2019 HNM    Methicillin resistance DETECTED (A) NOT DETECTED Final    Comment: CRITICAL RESULT CALLED TO, READ BACK BY AND VERIFIED WITH: SCOTT HALL PHARMD 2302 05/11/2019 HNM    Streptococcus species NOT DETECTED NOT DETECTED Final   Streptococcus agalactiae NOT DETECTED NOT DETECTED Final   Streptococcus pneumoniae NOT DETECTED NOT DETECTED Final   Streptococcus pyogenes NOT DETECTED NOT DETECTED Final   Acinetobacter baumannii NOT DETECTED NOT DETECTED Final   Enterobacteriaceae species NOT DETECTED NOT DETECTED Final   Enterobacter cloacae complex NOT DETECTED NOT DETECTED Final   Escherichia coli NOT DETECTED NOT DETECTED Final   Klebsiella oxytoca NOT DETECTED NOT DETECTED Final   Klebsiella pneumoniae NOT DETECTED NOT DETECTED Final   Proteus species NOT DETECTED NOT  DETECTED Final   Serratia marcescens NOT DETECTED NOT DETECTED Final   Haemophilus influenzae NOT DETECTED NOT DETECTED Final   Neisseria meningitidis NOT DETECTED NOT DETECTED Final   Pseudomonas aeruginosa NOT DETECTED NOT DETECTED Final   Candida albicans NOT DETECTED NOT DETECTED Final   Candida glabrata NOT DETECTED NOT DETECTED Final   Candida krusei NOT DETECTED NOT DETECTED Final   Candida parapsilosis NOT DETECTED NOT DETECTED Final   Candida tropicalis NOT DETECTED NOT DETECTED Final    Comment: Performed at [facility], [address] [place], [place] [address]  Respiratory Panel by RT PCR (Flu A&B, Covid) - Nasopharyngeal Swab     Status: None   Collection Time: 05/11/19 12:46 PM   Specimen: Nasopharyngeal Swab  Result Value Ref Range Status   SARS Coronavirus 2 by RT PCR NEGATIVE NEGATIVE Final    Comment: (NOTE) SARS-CoV-2 target nucleic acids are NOT DETECTED. The SARS-CoV-2 RNA is generally detectable in upper respiratoy specimens during the acute phase of infection. The lowest concentration of SARS-CoV-2 viral copies this assay can detect is 131 copies/mL. A negative result does not preclude SARS-Cov-2 infection and should not be used as the sole basis for treatment or other patient management decisions. A negative result may occur with  improper specimen collection/handling, submission of specimen other than nasopharyngeal swab, presence of viral mutation(s) within the areas targeted by this assay, and inadequate number of viral copies (<131 copies/mL). A negative result must be combined with clinical observations, patient history, and epidemiological information. The expected result is Negative. Fact Sheet for Patients:  PinkCheek.be Fact Sheet for Healthcare Providers:  GravelBags.it This test is not yet ap proved or cleared by the [place] FDA and  has been authorized for detection and/or  diagnosis of SARS-CoV-2 by FDA under an Emergency Use Authorization (EUA). This EUA will remain  in effect (meaning this test can be used) for the duration of the COVID-19 declaration under Section 564(b)(1) of the Act, 21 U.S.C. section 360bbb-3(b)(1), unless the authorization is terminated or revoked sooner.    Influenza A by PCR NEGATIVE NEGATIVE Final   Influenza B by PCR NEGATIVE NEGATIVE Final    Comment: (NOTE) The Xpert Xpress SARS-CoV-2/FLU/RSV assay is intended as an aid in  the diagnosis of influenza from Nasopharyngeal swab specimens and  should not be used as a sole basis for treatment. Nasal washings and  aspirates are unacceptable for Xpert Xpress SARS-CoV-2/FLU/RSV  testing. Fact Sheet for Patients: PinkCheek.be Fact Sheet for Healthcare Providers: GravelBags.it This test is not yet approved or cleared by the [place] FDA and  has been authorized for detection and/or diagnosis of SARS-CoV-2 by  FDA under an  Emergency Use Authorization (EUA). This EUA will remain  in effect (meaning this test can be used) for the duration of the  Covid-19 declaration under Section 564(b)(1) of the Act, 21  U.S.C. section 360bbb-3(b)(1), unless the authorization is  terminated or revoked. Performed at [facility], [address], [place], [place] [address]   Urine culture     Status: None   Collection Time: 05/11/19  5:06 PM   Specimen: In/Out Cath Urine  Result Value Ref Range Status   Specimen Description   Final    IN/OUT CATH URINE Performed at [facility], [address], [place], [place] [address]    Special Requests   Final    NONE Performed at [facility], [address], [place], [place] [address]    Culture   Final    NO GROWTH Performed at [facility], [place] [address] [place], [place] [address]    Report Status 05/12/2019 FINAL  Final  MRSA PCR Screening      Status: Abnormal   Collection Time: 05/11/19  5:52 PM   Specimen: Nasopharyngeal  Result Value Ref Range Status   MRSA by PCR POSITIVE (A) NEGATIVE Final    Comment:        The GeneXpert MRSA Assay (FDA approved for NASAL specimens only), is one component of a comprehensive MRSA colonization surveillance program. It is not intended to diagnose MRSA infection nor to guide or monitor treatment for MRSA infections. RESULT CALLED TO, READ BACK BY AND VERIFIED WITH: ANN [facility] 05/11/19 @ 1916  [place] Performed at [facility], [address] [place], [place] [address]     Best Practice/Protocols:  VTE Prophylaxis: Heparin (drip) GI Prophylaxis: Proton Pump Inhibitor Continous Sedation  Events: 12/9 emergent ureteral stent placement, left due to obstructing stone.  Urinary tract infection.  Severe sepsis.  Intubated, mechanically ventilated 12/10 left IJ CVL placed due to hypotension, need for pressors.  MRSA detected in the blood.  Vancomycin added. 12/11-patient successfully liberated from mechanical ventilation  Studies: DG Chest Port 1 View  Result Date: 05/12/2019 CLINICAL DATA:  Central line placement. EXAM: PORTABLE CHEST 1 VIEW COMPARISON:  Radiograph earlier this day. FINDINGS: Left internal jugular central venous catheter tip projects over the upper SVC. No pneumothorax. Endotracheal tube tip at the thoracic inlet. The enteric tube is in place, tip below the diaphragm not included in the field of view, side-port in the region of the gastroesophageal junction. Low lung volumes. Unchanged heart size and mediastinal contours. Slight worsening interstitial thickening from prior exam. Increasing nodular airspace disease in the left suprahilar lung and left midlung zone. No definite pleural fluid. IMPRESSION: 1. Left internal jugular central venous catheter tip projects over the upper SVC. No pneumothorax. 2. Slight increase interstitial thickening superimposed on chronic  bronchitic change, possible pulmonary edema. More nodular left lung airspace disease is also increasing suspicious for pneumonia. Electronically Signed   By: Keith Rake M.D.   On: 05/12/2019 07:02   DG Chest Port 1 View  Result Date: 05/12/2019 CLINICAL DATA:  Respiratory failure EXAM: PORTABLE CHEST 1 VIEW COMPARISON:  Yesterday FINDINGS: Endotracheal tube tip is just below the clavicular heads. The orogastric tube tip reaches the upper stomach. Artifact from EKG leads that is extensive. Borderline heart size accentuated by low volumes with asymmetric right diaphragm elevation. Generalized interstitial coarsening and a nodular density over the left upper lobe which was not seen on recent priors. No effusion or pneumothorax. IMPRESSION: 1.  Unremarkable hardware positioning. 2. Low volume chest with new nodular airspace opacity in the left upper lobe, possible early pneumonia. 3. Chronic bronchitic markings. Electronically Signed   By: Monte Fantasia M.D.   On: 05/12/2019 05:11   DG Chest Port 1 View  Result Date: 05/11/2019 CLINICAL DATA:  Respiratory failure EXAM: PORTABLE CHEST 1 VIEW COMPARISON:  Portable exam 1640 hours compared to 1037 hours FINDINGS: Tip of endotracheal tube projects 4.1 cm above carina. Nasogastric tube extends into stomach. Normal heart size, mediastinal contours, and pulmonary vascularity. Bibasilar atelectasis and small LEFT pleural effusion. Diffuse accentuation of interstitial markings, could represent edema or infection. No pneumothorax or acute osseous findings. IMPRESSION: Bibasilar atelectasis and small LEFT pleural effusion with scattered interstitial prominence throughout both lungs question minimal pulmonary edema or infection. Electronically Signed   By: Lavonia Dana M.D.   On: 05/11/2019 16:59   DG Chest Port 1 View  Result Date: 05/11/2019 CLINICAL DATA:  Dyspnea. EXAM: PORTABLE CHEST 1 VIEW COMPARISON:  01/22/2019 FINDINGS: Right IJ Port-A-Cath with tip over  the SVC. Lungs are hypoinflated with mild prominence of the central pulmonary vessels suggesting mild vascular congestion. No lobar consolidation or effusion. There is subtle patchy density in the mid lungs as could not exclude possible underlying infection. No pneumothorax. Cardiomediastinal silhouette and remainder of the exam is unchanged. IMPRESSION: Findings suggesting mild vascular congestion. Subtle patchy density in the mid lungs which could represent underlying infection. Right IJ Port-A-Cath with tip over the SVC. Electronically Signed   By: Marin Olp M.D.   On: 05/11/2019 11:10   DG OR UROLOGY CYSTO IMAGE ([place])  Result Date: 05/11/2019 There is no interpretation for this exam.  This order is for images obtained during a surgical procedure.  Please See "Surgeries" Tab for more information regarding the procedure.   ECHOCARDIOGRAM COMPLETE  Result Date: 05/12/2019   ECHOCARDIOGRAM REPORT   Patient Name:   MAXX PHAM Date of Exam: 05/12/2019 Medical Rec #:  [id number]      Height:       72.0 in Accession #:    [id number]     Weight:       198.6 lb Date of Birth:  04/10/1956      BSA:          2.12 m Patient Age:    63 years       BP:           109/60 mmHg Patient Gender: M              HR:           87 bpm. Exam Location:  [facility] Procedure: 2D Echo, Color Doppler and Cardiac Doppler Indications:     R78.81 Bacteremia  History:         Patient has no prior history of Echocardiogram examinations.                  Previous Myocardial Infarction; Stroke and COPD. Schizophrenia.  Sonographer:     Charmayne Sheer RDCS (AE) Referring Phys:  [id number] Bradly Bienenstock Diagnosing Phys: Ida Rogue MD  Sonographer Comments: Echo performed with patient supine and on artificial respirator and suboptimal parasternal window. Image acquisition challenging due to COPD. IMPRESSIONS  1. Left ventricular ejection fraction, by visual estimation, is 60 to 65%. The left ventricle has normal function. There is no  left ventricular hypertrophy.  2. Left ventricular diastolic parameters are consistent with Grade I diastolic dysfunction (impaired relaxation).  3. The left ventricle has no regional wall motion abnormalities.  4. Global right ventricle has normal systolic function.The right ventricular size is normal. No increase in right ventricular wall thickness.  5. Left atrial size was normal.  6. TR signal is inadequate for assessing pulmonary artery systolic pressure.  7. No valve vegetation noted FINDINGS  Left Ventricle: Left ventricular ejection fraction, by visual estimation, is 60 to 65%. The left ventricle has normal function. The left ventricle has no regional wall motion abnormalities. There is no left ventricular hypertrophy. Left ventricular diastolic parameters are consistent with Grade I diastolic dysfunction (impaired relaxation). Normal left atrial pressure. Right Ventricle: The right ventricular size is normal. No increase in right ventricular wall thickness. Global RV systolic function is has normal systolic function. Left Atrium: Left atrial size was normal in size. Right Atrium: Right atrial size was normal in size Pericardium: There is no evidence of pericardial effusion. Mitral Valve: The mitral valve is normal in structure. No evidence of mitral valve regurgitation. No evidence of mitral valve stenosis by observation. MV peak gradient, 4.5 mmHg. Tricuspid Valve: The tricuspid valve is normal in structure. Tricuspid valve regurgitation is not demonstrated. Aortic Valve: The aortic valve was not well visualized. Aortic valve regurgitation is not visualized. Aortic regurgitation PHT measures 903 msec. The aortic valve is structurally normal, with no evidence of sclerosis or stenosis. Aortic valve mean gradient measures 7.0 mmHg. Aortic valve peak gradient measures 11.7 mmHg. Aortic valve area, by VTI measures 1.79 cm. Pulmonic Valve: The pulmonic valve was normal in structure. Pulmonic valve regurgitation  is not visualized. Pulmonic regurgitation is not visualized. Aorta: The aortic root, ascending aorta and aortic arch are all structurally normal, with no evidence of dilitation or obstruction. Venous: The inferior vena cava is normal in size with greater than 50% respiratory variability, suggesting right atrial pressure of 3 mmHg. IAS/Shunts: No atrial level shunt detected by color flow Doppler. There is no evidence of a patent foramen ovale. No ventricular septal defect is seen or detected. There is no evidence of an atrial septal defect.  LEFT VENTRICLE PLAX 2D LVIDd:         4.70 cm  Diastology LVIDs:         2.59 cm  LV e' lateral:   5.98 cm/s LV PW:         0.82 cm  LV E/e' lateral: 12.4 LV IVS:        0.66 cm  LV e' medial:    6.74 cm/s LVOT diam:     1.70 cm  LV E/e' medial:  11.0 LV SV:         78 ml LV SV Index:   36.23 LVOT Area:     2.27 cm  RIGHT VENTRICLE RV Basal diam:  2.93 cm LEFT ATRIUM           Index       RIGHT ATRIUM           Index LA diam:      2.90 cm 1.37 cm/m  RA Area:     10.00 cm LA Vol (A4C): 30.1 ml 14.17 ml/m RA Volume:   19.50 ml  9.18 ml/m  AORTIC VALVE                    PULMONIC VALVE AV Area (Vmax):    1.69 cm     PV Vmax:       0.94 m/s AV Area (Vmean):   1.65  cm     PV Vmean:      61.800 cm/s AV Area (VTI):     1.79 cm     PV VTI:        0.171 m AV Vmax:           171.00 cm/s  PV Peak grad:  3.5 mmHg AV Vmean:          122.000 cm/s PV Mean grad:  2.0 mmHg AV VTI:            0.299 m AV Peak Grad:      11.7 mmHg AV Mean Grad:      7.0 mmHg LVOT Vmax:         127.00 cm/s LVOT Vmean:        88.600 cm/s LVOT VTI:          0.236 m LVOT/AV VTI ratio: 0.79 AI PHT:            903 msec  AORTA Ao Root diam: 3.30 cm MITRAL VALVE MV Area (PHT): 4.80 cm             SHUNTS MV Peak grad:  4.5 mmHg             Systemic VTI:  0.24 m MV Mean grad:  2.0 mmHg             Systemic Diam: 1.70 cm MV Vmax:       1.06 m/s MV Vmean:      61.5 cm/s MV VTI:        0.19 m MV PHT:        45.82 msec  MV Decel Time: 158 msec MV E velocity: 74.20 cm/s 103 cm/s MV A velocity: 98.50 cm/s 70.3 cm/s MV E/A ratio:  0.75       1.5  Ida Rogue MD Electronically signed by Ida Rogue MD Signature Date/Time: 05/12/2019/7:11:57 PM    Final    CT Renal Stone Study  Result Date: 05/11/2019 CLINICAL DATA:  Flank pain. Recurrent disease suspected. EXAM: CT ABDOMEN AND PELVIS WITHOUT CONTRAST TECHNIQUE: Multidetector CT imaging of the abdomen and pelvis was performed following the standard protocol without IV contrast. COMPARISON:  CT of the abdomen and pelvis without contrast 3/20/. FINDINGS: Lower chest: Patchy airspace opacities are present in the lower lobes bilaterally. Heart size is normal. No significant pleural or pericardial effusion present. Hepatobiliary: Hepatic cysts are stable. No new lesions are present. The largest lesion laterally in inferior right lower lobe measuring 18 mm. The common bile duct and gallbladder are normal. Pancreas: Unremarkable. No pancreatic ductal dilatation or surrounding inflammatory changes. Spleen: Normal in size without focal abnormality. Adrenals/Urinary Tract: Adrenal glands are normal bilaterally. Right nephrectomy noted. Prominent staghorn calculus seen on the prior study is near completely resolved. Prominent parenchymal calcification in the midportion of the left kidney measures 11 mm. Punctate calcification near the lower pole of the left kidney is stable. Mild prominence of the left ureter present above the level of a partially obstructing 5 mm stone at pelvic inlet. More distal ureter is normal. The urinary bladder is within limits. Stomach/Bowel: Stomach and duodenum are within normal limits. Bowel is unremarkable. Terminal ileum is normal. The ascending and transverse colon are within normal limits. Descending and sigmoid colon are normal. Vascular/Lymphatic: Atherosclerotic calcifications are present in the aorta and branch vessels without aneurysm. Reproductive:  Dense calcifications are present prostate gland. No discrete lesion is present. Other: Choose 1 Musculoskeletal: No acute or significant osseous  findings. IMPRESSION: 1. Partially obstructing 5 mm stone at the left pelvic inlet. 2. Mild prominence of the left ureter above the level the stone. 3. Marked reduction in stone burden in the left kidney. 4. Right nephrectomy. 5. Stable hepatic cysts. 6. Aortic Atherosclerosis (ICD10-I70.0). 7. Patchy airspace disease the lung bases bilaterally raises concern for infection. Electronically Signed   By: San Morelle M.D.   On: 05/11/2019 11:52    Consults: Treatment Team:  Hollice Espy, MD   Subjective:    Overnight Issues: Developed hypotension early this a.m.  Require central line placement as noted above.  Had some inferior lead ST elevation on ECG.  Troponin normal.  Objective:  Vital signs for last 24 hours: Temp:  [97.5 F (36.4 C)-98.4 F (36.9 C)] 98.1 F (36.7 C) (12/11 0800) Pulse Rate:  [69-128] 107 (12/11 1000) Resp:  [18-25] 21 (12/11 1000) BP: (90-134)/(55-85) 122/85 (12/11 1000) SpO2:  [89 %-98 %] 92 % (12/11 1000) FiO2 (%):  [35 %-50 %] 35 % (12/11 0818) Weight:  [93.5 kg] 93.5 kg (12/11 0432)  Hemodynamic parameters for last 24 hours:    Intake/Output from previous day: 12/10 0701 - 12/11 0700 In: 4406.9 [I.V.:4045.4; IV Piggyback:361.5] Out: 1200 [Urine:1200]  Intake/Output this shift: Total I/O In: 100.2 [I.V.:100.2] Out: 640 [Urine:640]  Vent settings for last 24 hours: Vent Mode: PRVC FiO2 (%):  [35 %-50 %] 35 % Set Rate:  [22 bmp] 22 bmp Vt Set:  [500 mL] 500 mL PEEP:  [5 cmH20] 5 cmH20  Physical Exam:  Constitutional:   Diaphoretic. Chronically ill-appearing, appears older than stated age.   HENT:  Head: Normocephalic and atraumatic.  Orotracheally intubated.  Oral mucosa moist.  Eyes: Pupils are equal, round, and reactive to light. Conjunctivae are normal. No scleral icterus.  Neck: No JVD  present. No tracheal deviation present. No thyromegaly present.  Cardiovascular: Normal rate and regular rhythm.  Respiratory:  Rhonchi noted, some mild wheezing.[name] He has no rales.  GI: Soft. Bowel sounds are normal. He exhibits no distension.  Musculoskeletal:No deformity or edema.  Neurological:  Sedated no further assessment can be made  Skin: Skin is warm and dry.    Chest x-ray reviewed independently.  All lab data reviewed.  Medications reviewed with Pharm.D.   Assessment/Plan:  Acute respiratory failure due to: COPD exacerbation in the setting of: Severe sepsis Continue ventilator support, discussed with RT Bronchodilator treatments No steroids currently  Pulmonary toilet Antibiotic therapy    Severe sepsis with septic shock Obstructing renal calculus Urinary tract infection Known history of ESBL MRSA bacteremia Continue volume resuscitation/support Underwent emergent ureteral stent placement On meropenem Follow culture results ID consultation for MRSA bacteremia 2D echo has been ordered and pending may need TEE Repeat blood cultures Appropriate isolation for ESBL history and now MRSA Periodic check on chest x-ray as does have some nodular infiltrates left upper lobe may indicate septic emboli/early cavitating lesions.  ST changes on inferior leads May be demand ischemia Heparin changed to heparin infusion Cycle troponins Cardiac monitoring   Acute on chronic renal failure Solitary kidney Avoid nephrotoxins Maintain perfusion, avoid hypotension Volume resuscitate Discontinued sodium bicarb infusion Pharmacy to replace electrolytes Dose antibiotics according to renal function  Paranoid schizophrenia This issue adds complexity to his management Resume home medications as soon as feasible  DVT prophylaxis:heparin SQ switched to heparin infusion GI prophylax:PPI    LOS: 2 days   Additional comments:No family available for  update.  Critical care provider statement:  Critical care time (minutes):  33   Critical care time was exclusive of:  Separately billable procedures and  treating other patients   Critical care was necessary to treat or prevent imminent or  life-threatening deterioration of the following conditions:   MRSA bacteremia, circulatory shock, sepsis, paranoid schizophrenia, urinary tract infection, COPD exacerbation, multiple comorbid conditions   Critical care was time spent personally by me on the following  activities:  Development of treatment plan with patient or surrogate,  discussions with consultants, evaluation of patient's response to  treatment, examination of patient, obtaining history from patient or  surrogate, ordering and performing treatments and interventions, ordering  and review of laboratory studies and re-evaluation of patient's condition   I assumed direction of critical care for this patient from another  provider in my specialty: no    *Care during the described time interval was provided by me and/or other providers on the critical care team.  I have reviewed this patient's available data, including medical history, events of note, physical examination and test results as part of my evaluation.  **This note was dictated using voice recognition software/Dragon.  Despite best efforts to proofread, errors can occur which can change the meaning.  Any change was purely unintentional.

## 2019-05-13 NOTE — Progress Notes (Signed)
Pt extubated 1450.  He subsequently became very agitated, confused, uanble to be redirected, we did try Ativan and placed a nicotine patch, pt seems to suffer from pathological thirst, stated he is anxious and SOB because he has Covid, and does not remember being reassured he does not have Covid, looks in the bed for his "soda Pop " and his wallet.  Eventually we started precedex d/t ST w/ frequent PVC following extubation and tachypnea in the high 30s.  He is resting comfortably at this time with HFNC bubbles at 10 liters.

## 2019-05-13 NOTE — TOC Initial Note (Signed)
Transition of Care [facility]) - Initial/Assessment Note    Patient Details  Name: Alan Small MRN: [id number] Date of Birth: 1955-07-12  [facility] CM/SW Contact:    Shade Flood, LCSW Phone Number: 05/13/2019, 3:13 PM  Clinical Narrative:                  Pt admitted from [facility] [place]. Pt has a legal guardian from [place], Alan Small. Pt discussed in Rounds today. Pt is intubated. Per MD, plan is for TEE. Pt was getting IV anbx through a port prior to admission. That port has now been pulled. Pt may benefit from SNF at dc if he needs further IV anbx therapy. Pt has a history of schizophrenia.  TOC will follow and further assist with dc planning once needs become known.  Expected Discharge Plan: [place] Barriers to Discharge: Continued Medical Work up   Patient Goals and CMS Choice        Expected Discharge Plan and Services Expected Discharge Plan: [name]       Living arrangements for the past 2 months: Group Home                                      Prior Living Arrangements/Services Living arrangements for the past 2 months: Group Home Lives with:: Facility Resident Patient language and need for interpreter reviewed:: Yes Do you feel safe going back to the place where you live?: Yes      Need for Family Participation in Patient Care: No (Comment) Care giver support system in place?: Yes (comment)   Criminal Activity/Legal Involvement Pertinent to Current Situation/Hospitalization: No - Comment as needed  Activities of Daily Living      Permission Sought/Granted                  Emotional Assessment Appearance:: Appears stated age Attitude/Demeanor/Rapport: Unable to Assess Affect (typically observed): Unable to Assess Orientation: : Oriented to Self Alcohol / Substance Use: Tobacco Use Psych Involvement: No (comment)  Admission diagnosis:  Dyspnea [R06.00] Patient Active Problem List    Diagnosis Date Noted  . Community acquired pneumonia 01/20/2019  . Gastroenteritis 01/20/2019  . Blood in stool   . Change in bowel habits   . Constipation   . Polyp of sigmoid colon   . Depression 02/26/2016  . COPD (chronic obstructive pulmonary disease) ([place]) 02/03/2016  . Suprapubic catheter ([place]) 12/17/2015  . Urethral stricture 12/17/2015  . Urinary retention 10/27/2015  . Tobacco use disorder 08/27/2015  . Malnutrition of moderate degree 05/09/2015  . Sepsis ([place]) 05/06/2015  . UTI (lower urinary tract infection) 01/17/2015  . Hypoglycemia 01/17/2015  . Gastroesophageal reflux disease 10/28/2014  . Bright red rectal bleeding 10/26/2014  . Chronic constipation 10/26/2014  . Hydronephrosis of left kidney 01/17/2014  . Neurogenic bladder 06/21/2013  . Urolithiasis 03/15/2013  . Nephrolithiasis 03/04/2013  . Metabolic acidosis 58/52/7782  . Acute renal failure ([place]) 11/03/2012  . Hyperkalemia 11/03/2012  . Pyelonephritis 11/03/2012  . Schizophrenia ([place]) 11/03/2012  . Hypothyroidism 11/03/2012  . Severe sepsis ([place]) 11/03/2012  . Protein-calorie malnutrition, severe ([place]) 11/03/2012  . Encephalopathy acute 11/03/2012  . Chronic kidney disease, stage IV (severe) ([place]) 07/02/2012  . 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 Kidney stone 04/13/2012   PCP:  [name], MD Pharmacy:   [place],  [place] - [address] [address] Phone: [phone] Fax: [phone]     Social Determinants of Health (SDOH) Interventions    Readmission Risk Interventions Readmission Risk Prevention Plan 05/13/2019  Transportation Screening Complete  Palliative Care Screening Not Applicable  Medication Review (RN Care Manager) Complete  Some recent data might be hidden

## 2019-05-13 NOTE — Progress Notes (Signed)
ID Extubated this afternoon Awake Responding to questions  BP 122/85   Pulse (!) 107   Temp 98.1 F (36.7 C) (Bladder)   Resp (!) 21   Ht 6' 0.01" (1.829 m)   Wt 93.5 kg   SpO2 92%   BMI 27.95 kg/m    Left IJ line Chest b/l air entry abd soft HSs 1s2  CBC Latest Ref Rng & Units 05/13/2019 05/12/2019 05/11/2019  WBC 4.0 - 10.5 K/uL 10.4 8.9 13.0(H)  Hemoglobin 13.0 - 17.0 g/dL 9.9(L) 11.0(L) 11.5(L)  Hematocrit 39.0 - 52.0 % 30.1(L) 33.2(L) 34.3(L)  Platelets 150 - 400 K/uL 73(L) 67(L) 79(L)    CMP Latest Ref Rng & Units 05/13/2019 05/12/2019 05/11/2019  Glucose 70 - 99 mg/dL 138(H) 152(H) -  BUN 8 - 23 mg/dL 55(H) 49(H) -  Creatinine 0.61 - 1.24 mg/dL 2.98(H) 2.99(H) 2.99(H)  Sodium 135 - 145 mmol/L 140 138 -  Potassium 3.5 - 5.1 mmol/L 3.7 3.9 -  Chloride 98 - 111 mmol/L 104 107 -  CO2 22 - 32 mmol/L 26 18(L) -  Calcium 8.9 - 10.3 mg/dL 7.9(L) 7.8(L) -  Total Protein 6.5 - 8.1 g/dL - - -  Total Bilirubin 0.3 - 1.2 mg/dL - - -  Alkaline Phos 38 - 126 U/L - - -  AST 15 - 41 U/L - - -  ALT 0 - 44 U/L - - -   Impression/Recommendation  MRSA bacteremia Source central line which he came in with and was present since sept 2020. Line has been removed On Ceftaroline because of single kidney and CKD avoid vanco TEE is needed to r/o endocarditis  Left ureteric stone- has a stent  Complicated urological history of the left kidney, with stag horn calcus, obstructive uropathy, stent, nephrostomy, retroperitoneal abscess, lithotripsy, stent removal and 8 weeks of Iv antibiotics which he finished 04/16/19  CKD  H/O ESBL e.coli and enterococcus fecalis in urine treated  Discussed the management with intensivist ID will follow him peripherally this weekend- call if needed

## 2019-05-13 NOTE — Progress Notes (Signed)
Urology Inpatient Progress Note  Subjective: Alan Small is a 63 y.o. male admitted on 05/11/2019 with a partially obstructing 5 mm left ureteral calculus with a solitary left kidney, sepsis, respiratory failure, and acute on chronic renal insufficiency.  He is POD2 from urgent left ureteral stent placement with Dr. Erlene Quan.  He remains intubated in the ICU.  Creatinine stable today, 2.98.  WBC count increased, 10.4.  He is afebrile with BP in the 100s/50s.   Urine cultures negative.  On antibiotics as below.  Foley catheter in place draining clear, yellow urine.  1200 mL urinary output yesterday.  Anti-infectives: Anti-infectives (From admission, onward)   Start     Dose/Rate Route Frequency Ordered Stop   05/13/19 0000  vancomycin (VANCOCIN) 1,250 mg in sodium chloride 0.9 % 250 mL IVPB  Status:  Discontinued     1,250 mg 166.7 mL/hr over 90 Minutes Intravenous Every 36 hours 05/12/19 1334 05/12/19 1635   05/12/19 2200  ceftaroline (TEFLARO) 300 mg in sodium chloride 0.9 % 250 mL IVPB  Status:  Discontinued     300 mg 250 mL/hr over 60 Minutes Intravenous Every 12 hours 05/12/19 1636 05/12/19 1638   05/12/19 2200  ceftaroline (TEFLARO) 400 mg in sodium chloride 0.9 % 250 mL IVPB     400 mg 250 mL/hr over 60 Minutes Intravenous Every 12 hours 05/12/19 1638     05/12/19 0000  meropenem (MERREM) 500 mg in sodium chloride 0.9 % 100 mL IVPB  Status:  Discontinued     500 mg 200 mL/hr over 30 Minutes Intravenous Every 12 hours 05/11/19 2020 05/12/19 1635   05/11/19 2345  vancomycin (VANCOCIN) 1,750 mg in sodium chloride 0.9 % 500 mL IVPB     1,750 mg 250 mL/hr over 120 Minutes Intravenous  Once 05/11/19 2326 05/12/19 0212   05/11/19 2326  vancomycin variable dose per unstable renal function (pharmacist dosing)  Status:  Discontinued      Does not apply See admin instructions 05/11/19 2326 05/12/19 1334   05/11/19 1145  meropenem (MERREM) 1 g in sodium chloride 0.9 % 100 mL IVPB     1  g 200 mL/hr over 30 Minutes Intravenous  Once 05/11/19 1132 05/11/19 1235      Current Facility-Administered Medications  Medication Dose Route Frequency Provider Last Rate Last Admin  . acetaminophen (TYLENOL) tablet 650 mg  650 mg Oral Q4H PRN Tyler Pita, MD      . albuterol (PROVENTIL) (2.5 MG/3ML) 0.083% nebulizer solution 2.5 mg  2.5 mg Nebulization Q4H PRN Tyler Pita, MD      . ceftaroline (TEFLARO) 400 mg in sodium chloride 0.9 % 250 mL IVPB  400 mg Intravenous Q12H Tsosie Billing, MD 250 mL/hr at 05/13/19 1223 400 mg at 05/13/19 1223  . chlorhexidine gluconate (MEDLINE KIT) (PERIDEX) 0.12 % solution 15 mL  15 mL Mouth Rinse BID Tyler Pita, MD   15 mL at 05/13/19 0911  . Chlorhexidine Gluconate Cloth 2 % PADS 6 each  6 each Topical Daily Bradly Bienenstock, NP   6 each at 05/13/19 0912  . fentaNYL 2573mg in NS 2596m(1025mml) infusion-PREMIX  0-400 mcg/hr Intravenous Continuous GonTyler PitaD 5 mL/hr at 05/13/19 0815 50 mcg/hr at 05/13/19 0815  . heparin injection 5,000 Units  5,000 Units Subcutaneous Q8H GonTyler PitaD   5,000 Units at 05/13/19 1222  . ipratropium-albuterol (DUONEB) 0.5-2.5 (3) MG/3ML nebulizer solution 3 mL  3 mL Nebulization Q6H GonPatsey Berthold  Lolita Cram, MD   3 mL at 05/13/19 1324  . lactated ringers infusion   Intravenous Continuous Tyler Pita, MD 75 mL/hr at 05/13/19 0800 Rate Verify at 05/13/19 0800  . MEDLINE mouth rinse  15 mL Mouth Rinse 10 times per day Tyler Pita, MD   15 mL at 05/13/19 1313  . midazolam (VERSED) injection 2 mg  2 mg Intravenous Q15 min PRN Darel Hong D, NP      . midazolam (VERSED) injection 2 mg  2 mg Intravenous Q2H PRN Bradly Bienenstock, NP   2 mg at 05/12/19 2113  . mupirocin ointment (BACTROBAN) 2 % 1 application  1 application Nasal BID Flora Lipps, MD   1 application at 02/54/27 0913  . norepinephrine (LEVOPHED) 16 mg in 250m premix infusion  0-40 mcg/min Intravenous  Titrated KDarel HongD, NP 4.69 mL/hr at 05/12/19 1000 5 mcg/min at 05/12/19 1000  . ondansetron (ZOFRAN) injection 4 mg  4 mg Intravenous Q6H PRN GTyler Pita MD      . paliperidone (INVEGA SUSTENNA) injection 156 mg  156 mg Intramuscular Once AOttie Glazier MD      . pantoprazole (PROTONIX) injection 40 mg  40 mg Intravenous QHS GTyler Pita MD   40 mg at 05/12/19 2221  . propofol (DIPRIVAN) 1000 MG/100ML infusion  5-80 mcg/kg/min Intravenous Titrated GTyler Pita MD 15.43 mL/hr at 05/13/19 0800 30 mcg/kg/min at 05/13/19 0800   Objective: Vital signs in last 24 hours: Temp:  [97.5 F (36.4 C)-98.4 F (36.9 C)] 98.1 F (36.7 C) (12/11 0800) Pulse Rate:  [69-128] 107 (12/11 1000) Resp:  [18-23] 21 (12/11 1000) BP: (90-134)/(55-85) 122/85 (12/11 1000) SpO2:  [89 %-98 %] 92 % (12/11 1000) FiO2 (%):  [35 %-50 %] 35 % (12/11 1213) Weight:  [93.5 kg] 93.5 kg (12/11 0432)  Intake/Output from previous day: 12/10 0701 - 12/11 0700 In: 4406.9 [I.V.:4045.4; IV Piggyback:361.5] Out: 1200 [Urine:1200] Intake/Output this shift: Total I/O In: 100.2 [I.V.:100.2] Out: 640 [Urine:640]  Physical Exam Vitals and nursing note reviewed.  Constitutional:      Comments: Able to nod head upon command, however comprehension indeterminate  HENT:     Head: Normocephalic and atraumatic.  Neurological:     Mental Status: He is lethargic.    Lab Results:  Recent Labs    05/12/19 0521 05/13/19 0637  WBC 8.9 10.4  HGB 11.0* 9.9*  HCT 33.2* 30.1*  PLT 67* 73*   BMET Recent Labs    05/12/19 0521 05/13/19 0637  NA 138 140  K 3.9 3.7  CL 107 104  CO2 18* 26  GLUCOSE 152* 138*  BUN 49* 55*  CREATININE 2.99* 2.98*  CALCIUM 7.8* 7.9*   PT/INR Recent Labs    05/11/19 1047  LABPROT 15.5*  INR 1.2   ABG Recent Labs    05/11/19 1644 05/11/19 2000  PHART 7.17* 7.30*  HCO3 18.6* 17.7*   Assessment & Plan: 63year old male s/p left ureteral stent placement  in the setting of a 5 mm partially obstructing left ureteral stone with a solitary left kidney, also with sepsis, respiratory failure, and acute on chronic renal insufficiency.  Creatinine stable, producing yellow urine.  He remains intubated.  Urine culture is negative, rules out urinary source. Per ID, MRSA bacteremia believed to be secondary to central line, since removed.    Creatinine stably elevated, will consider imaging early next week for further evaluation if it is not downtrending by then.  No  new recommendations today.  Will continue to monitor.  Debroah Loop, PA-C 05/13/2019

## 2019-05-14 LAB — CBC
HCT: 32.5 % — ABNORMAL LOW (ref 39.0–52.0)
Hemoglobin: 10.3 g/dL — ABNORMAL LOW (ref 13.0–17.0)
MCH: 28.5 pg (ref 26.0–34.0)
MCHC: 31.7 g/dL (ref 30.0–36.0)
MCV: 90 fL (ref 80.0–100.0)
Platelets: 107 10*3/uL — ABNORMAL LOW (ref 150–400)
RBC: 3.61 MIL/uL — ABNORMAL LOW (ref 4.22–5.81)
RDW: 14.2 % (ref 11.5–15.5)
WBC: 13.3 10*3/uL — ABNORMAL HIGH (ref 4.0–10.5)
nRBC: 0 % (ref 0.0–0.2)

## 2019-05-14 LAB — GLUCOSE, CAPILLARY
Glucose-Capillary: 110 mg/dL — ABNORMAL HIGH (ref 70–99)
Glucose-Capillary: 93 mg/dL (ref 70–99)
Glucose-Capillary: 84 mg/dL (ref 70–99)
Glucose-Capillary: 70 mg/dL (ref 70–99)
Glucose-Capillary: 86 mg/dL (ref 70–99)

## 2019-05-14 LAB — RENAL FUNCTION PANEL
Albumin: 2 g/dL — ABNORMAL LOW (ref 3.5–5.0)
Anion gap: 8 (ref 5–15)
BUN: 56 mg/dL — ABNORMAL HIGH (ref 8–23)
CO2: 27 mmol/L (ref 22–32)
Calcium: 8.2 mg/dL — ABNORMAL LOW (ref 8.9–10.3)
Chloride: 112 mmol/L — ABNORMAL HIGH (ref 98–111)
Creatinine, Ser: 2.6 mg/dL — ABNORMAL HIGH (ref 0.61–1.24)
GFR calc Af Amer: 29 mL/min — ABNORMAL LOW (ref >60)
GFR calc non Af Amer: 25 mL/min — ABNORMAL LOW (ref >60)
Glucose, Bld: 108 mg/dL — ABNORMAL HIGH (ref 70–99)
Phosphorus: 3.8 mg/dL (ref 2.5–4.6)
Potassium: 3.5 mmol/L (ref 3.5–5.1)
Sodium: 147 mmol/L — ABNORMAL HIGH (ref 135–145)

## 2019-05-14 LAB — CULTURE, BLOOD (ROUTINE X 2): Special Requests: ADEQUATE

## 2019-05-14 LAB — MAGNESIUM: Magnesium: 2.6 mg/dL — ABNORMAL HIGH (ref 1.7–2.4)

## 2019-05-14 MED ORDER — DIPHENHYDRAMINE HCL 50 MG/ML IJ SOLN
50.0000 mg | Freq: Three times a day (TID) | INTRAMUSCULAR | Status: DC
  Administered 2019-05-14 – 2019-05-15 (×4): 50 mg via INTRAVENOUS
  Filled 2019-05-14 (×4): qty 1

## 2019-05-14 MED ORDER — POTASSIUM CHLORIDE 2 MEQ/ML IV SOLN
INTRAVENOUS | Status: DC
  Administered 2019-05-14: 17:00:00 via INTRAVENOUS
  Filled 2019-05-14 (×2): qty 1000

## 2019-05-14 MED ORDER — VALPROATE SODIUM 500 MG/5ML IV SOLN
10.0000 mg/kg/d | Freq: Two times a day (BID) | INTRAVENOUS | Status: DC
  Administered 2019-05-14 – 2019-05-15 (×4): 446 mg via INTRAVENOUS
  Filled 2019-05-14 (×6): qty 4.46

## 2019-05-14 MED ORDER — ORAL CARE MOUTH RINSE
15.0000 mL | Freq: Two times a day (BID) | OROMUCOSAL | Status: DC
  Administered 2019-05-14 – 2019-05-15 (×2): 15 mL via OROMUCOSAL

## 2019-05-14 MED ORDER — ADULT MULTIVITAMIN LIQUID CH
15.0000 mL | Freq: Every day | ORAL | Status: DC
  Administered 2019-05-14 – 2019-05-24 (×6): 15 mL via ORAL
  Filled 2019-05-14 (×11): qty 15

## 2019-05-14 MED ORDER — M.V.I. ADULT IV INJ
Freq: Once | INTRAVENOUS | Status: DC
  Filled 2019-05-14: qty 10

## 2019-05-14 NOTE — Plan of Care (Signed)
Pt alternated between lethargy/sleep and sudden periods of extreme agitation with repeated attempts to get OOB/kicking/pulling at lines, removing [name].  Unable to redirect.  Offering ice chips/sips, which he takes w/o issue, when he is lucid enough.  Speech is difficult to decipher, but pt can be accusatory and somewhat threatening when speech can be understood. pupils brisk and round, although right is larger than left.  VSS on 3 liters [place].  UOP adequate,  MD gave orders for valproate and a sitter d/t ongoing delirium.  Precedex infusing at max 1.2 mcg/kg/min.

## 2019-05-14 NOTE — Progress Notes (Signed)
Follow up - Critical Care Medicine Note  Patient Details:    Alan Small is an 63 y.o. male current smoker with a complex medical history and a solitary left kidney who underwent emergency ureteral stent placement for a partially obstructing ureteral calculus.  Patient has UTI.  Due to underlying COPD and COPD exacerbation the patient remain on the ventilator post procedure.  Lines, Airways, Drains: Airway 8 mm (Active)  Secured at (cm) 23 cm 05/12/19 2000  Measured From Lips 05/12/19 [place] 05/12/19 2000  Secured By [employer] 05/12/19 2000  Tube Holder Repositioned Yes 05/12/19 1610  Cuff Pressure (cm H2O) 28 cm H2O 05/12/19 1610  Site Condition Cool;Dry 05/12/19 2000     CVC Triple Lumen 05/12/19 Left Internal jugular (Active)  Indication for Insertion or Continuance of Line Vasoactive infusions 05/12/19 2000  Site Assessment Clean;Dry;Intact 05/12/19 2000  Proximal Lumen Status Infusing 05/12/19 2000  Medial Lumen Status Infusing 05/12/19 2000  Distal Lumen Status Infusing 05/12/19 2000  Dressing Type Transparent 05/12/19 2000  Dressing Status Clean;Dry;Intact 05/12/19 2000  Line Care Connections checked and tightened 05/12/19 2000  Dressing Intervention Other (Comment) 05/12/19 2000  Dressing Change Due 05/19/19 05/12/19 2000     NG/OG Tube Nasogastric 18 Fr. Right nare Xray Documented cm marking at nare/ corner of mouth 55 cm (Active)  Cm Marking at Nare/Corner of Mouth (if applicable) 55 cm 28/31/51 2000  Site Assessment Clean;Dry;Intact 05/12/19 2000  Ongoing Placement Verification No change in cm markings or external length of tube from initial placement;No change in respiratory status;No acute changes, not attributed to clinical condition;Xray 05/12/19 2000  Status Suction-low intermittent 05/12/19 2000  Drainage Appearance Brown 05/12/19 2000     Urethral Catheter Dr. Erlene Quan Latex;Temperature probe 14 Fr. (Active)  Indication for  Insertion or Continuance of Catheter Unstable critically ill patients first 24-48 hours (See Criteria) 05/12/19 2000  Site Assessment Intact;Clean 05/12/19 2000  Catheter Maintenance Bag below level of bladder;Catheter secured;Drainage bag/tubing not touching floor;Insertion date on drainage bag;No dependent loops;Seal intact 05/12/19 2000  Collection Container Standard drainage bag 05/12/19 2000  Securement Method Leg strap 05/12/19 2000  Urinary Catheter Interventions (if applicable) Unclamped 76/16/07 2000  Output (mL) 50 mL 05/12/19 2200     Suprapubic Catheter Latex 14 Fr. (Active)     Ureteral Drain/Stent Left ureter 6 Fr. (Active)     Ureteral Drain/Stent Left ureter 6 Fr. (Active)  Dressing Status Clean;Dry;Intact 05/12/19 0800  Output (mL) 400 mL 05/12/19 1800    Anti-infectives:  Anti-infectives (From admission, onward)   Start     Dose/Rate Route Frequency Ordered Stop   05/13/19 0000  vancomycin (VANCOCIN) 1,250 mg in sodium chloride 0.9 % 250 mL IVPB  Status:  Discontinued     1,250 mg 166.7 mL/hr over 90 Minutes Intravenous Every 36 hours 05/12/19 1334 05/12/19 1635   05/12/19 2200  ceftaroline (TEFLARO) 300 mg in sodium chloride 0.9 % 250 mL IVPB  Status:  Discontinued     300 mg 250 mL/hr over 60 Minutes Intravenous Every 12 hours 05/12/19 1636 05/12/19 1638   05/12/19 2200  ceftaroline (TEFLARO) 400 mg in sodium chloride 0.9 % 250 mL IVPB     400 mg 250 mL/hr over 60 Minutes Intravenous Every 12 hours 05/12/19 1638     05/12/19 0000  meropenem (MERREM) 500 mg in sodium chloride 0.9 % 100 mL IVPB  Status:  Discontinued     500 mg 200 mL/hr over 30 Minutes  Intravenous Every 12 hours 05/11/19 2020 05/12/19 1635   05/11/19 2345  vancomycin (VANCOCIN) 1,750 mg in sodium chloride 0.9 % 500 mL IVPB     1,750 mg 250 mL/hr over 120 Minutes Intravenous  Once 05/11/19 2326 05/12/19 0212   05/11/19 2326  vancomycin variable dose per unstable renal function (pharmacist dosing)   Status:  Discontinued      Does not apply See admin instructions 05/11/19 2326 05/12/19 1334   05/11/19 1145  meropenem (MERREM) 1 g in sodium chloride 0.9 % 100 mL IVPB     1 g 200 mL/hr over 30 Minutes Intravenous  Once 05/11/19 1132 05/11/19 1235      Microbiology: Results for orders placed or performed during the hospital encounter of 05/11/19  Blood Culture (routine x 2)     Status: Abnormal   Collection Time: 05/11/19 11:14 AM   Specimen: BLOOD  Result Value Ref Range Status   Specimen Description   Final    BLOOD RIGHT ANTECUBITAL Performed at [facility], [address] [place], [place] [address]    Special Requests   Final    BOTTLES DRAWN AEROBIC AND ANAEROBIC Blood Culture results may not be optimal due to an excessive volume of blood received in culture bottles Performed at [facility], [address] [place], [place] [address]    Culture  Setup Time   Final    GRAM POSITIVE COCCI IN BOTH AEROBIC AND ANAEROBIC BOTTLES CRITICAL VALUE NOTED.  VALUE IS CONSISTENT WITH PREVIOUSLY REPORTED AND CALLED VALUE. Performed at [facility], [address] [place], [place] [address]    Culture (A)  Final    STAPHYLOCOCCUS AUREUS SUSCEPTIBILITIES PERFORMED ON PREVIOUS CULTURE WITHIN THE LAST 5 DAYS. Performed at [facility], [place] [address] [place], [place] [address]    Report Status 05/14/2019 FINAL  Final  Blood Culture (routine x 2)     Status: Abnormal   Collection Time: 05/11/19 11:14 AM   Specimen: BLOOD  Result Value Ref Range Status   Specimen Description   Final    BLOOD RIGHT CHEST Performed at [facility], [address] [place], [place] [address]    Special Requests   Final    BOTTLES DRAWN AEROBIC AND ANAEROBIC Blood Culture adequate volume Performed at [facility], [address] [place],  [address]    Culture  Setup Time   Final    GRAM POSITIVE COCCI IN BOTH AEROBIC AND ANAEROBIC  BOTTLES CRITICAL RESULT CALLED TO, READ BACK BY AND VERIFIED WITH: Hart Robinsons [facility] 4287 05/11/2019 HNM Performed at [facility], [place] [address], [place], [place] [address]    Culture METHICILLIN RESISTANT STAPHYLOCOCCUS AUREUS (A)  Final   Report Status 05/14/2019 FINAL  Final   Organism ID, Bacteria METHICILLIN RESISTANT STAPHYLOCOCCUS AUREUS  Final      Susceptibility   Methicillin resistant staphylococcus aureus - MIC*    CIPROFLOXACIN >=8 RESISTANT Resistant     ERYTHROMYCIN >=8 RESISTANT Resistant     GENTAMICIN <=0.5 SENSITIVE Sensitive     OXACILLIN >=4 RESISTANT Resistant     TETRACYCLINE <=1 SENSITIVE Sensitive     VANCOMYCIN 1 SENSITIVE Sensitive     TRIMETH/SULFA <=10 SENSITIVE Sensitive     CLINDAMYCIN <=0.25 SENSITIVE Sensitive     RIFAMPIN <=0.5 SENSITIVE Sensitive     Inducible Clindamycin NEGATIVE Sensitive     * METHICILLIN RESISTANT STAPHYLOCOCCUS AUREUS  Blood Culture ID Panel (Reflexed)  Status: Abnormal   Collection Time: 05/11/19 11:14 AM  Result Value Ref Range Status   Enterococcus species NOT DETECTED NOT DETECTED Final   Listeria monocytogenes NOT DETECTED NOT DETECTED Final   Staphylococcus species DETECTED (A) NOT DETECTED Final    Comment: CRITICAL RESULT CALLED TO, READ BACK BY AND VERIFIED WITH: SCOTT HALL PHARMD 2302 05/11/2019 HNM    Staphylococcus aureus (BCID) DETECTED (A) NOT DETECTED Final    Comment: Methicillin (oxacillin)-resistant Staphylococcus aureus (MRSA). MRSA is predictably resistant to beta-lactam antibiotics (except ceftaroline). Preferred therapy is vancomycin unless clinically contraindicated. Patient requires contact precautions if  hospitalized. CRITICAL RESULT CALLED TO, READ BACK BY AND VERIFIED WITH: SCOTT HALL PHARMD 2302 05/11/2019 HNM    Methicillin resistance DETECTED (A) NOT DETECTED Final    Comment: CRITICAL RESULT CALLED TO, READ BACK BY AND VERIFIED WITH: SCOTT HALL PHARMD 2302 05/11/2019 HNM     Streptococcus species NOT DETECTED NOT DETECTED Final   Streptococcus agalactiae NOT DETECTED NOT DETECTED Final   Streptococcus pneumoniae NOT DETECTED NOT DETECTED Final   Streptococcus pyogenes NOT DETECTED NOT DETECTED Final   Acinetobacter baumannii NOT DETECTED NOT DETECTED Final   Enterobacteriaceae species NOT DETECTED NOT DETECTED Final   Enterobacter cloacae complex NOT DETECTED NOT DETECTED Final   Escherichia coli NOT DETECTED NOT DETECTED Final   Klebsiella oxytoca NOT DETECTED NOT DETECTED Final   Klebsiella pneumoniae NOT DETECTED NOT DETECTED Final   Proteus species NOT DETECTED NOT DETECTED Final   Serratia marcescens NOT DETECTED NOT DETECTED Final   Haemophilus influenzae NOT DETECTED NOT DETECTED Final   Neisseria meningitidis NOT DETECTED NOT DETECTED Final   Pseudomonas aeruginosa NOT DETECTED NOT DETECTED Final   Candida albicans NOT DETECTED NOT DETECTED Final   Candida glabrata NOT DETECTED NOT DETECTED Final   Candida krusei NOT DETECTED NOT DETECTED Final   Candida parapsilosis NOT DETECTED NOT DETECTED Final   Candida tropicalis NOT DETECTED NOT DETECTED Final    Comment: Performed at [facility], [address] [name], [place] [address]  Respiratory Panel by RT PCR (Flu A&B, Covid) - Nasopharyngeal Swab     Status: None   Collection Time: 05/11/19 12:46 PM   Specimen: Nasopharyngeal Swab  Result Value Ref Range Status   SARS Coronavirus 2 by RT PCR NEGATIVE NEGATIVE Final    Comment: (NOTE) SARS-CoV-2 target nucleic acids are NOT DETECTED. The SARS-CoV-2 RNA is generally detectable in upper respiratoy specimens during the acute phase of infection. The lowest concentration of SARS-CoV-2 viral copies this assay can detect is 131 copies/mL. A negative result does not preclude SARS-Cov-2 infection and should not be used as the sole basis for treatment or other patient management decisions. A negative result may occur with  improper specimen  collection/handling, submission of specimen other than nasopharyngeal swab, presence of viral mutation(s) within the areas targeted by this assay, and inadequate number of viral copies (<131 copies/mL). A negative result must be combined with clinical observations, patient history, and epidemiological information. The expected result is Negative. Fact Sheet for Patients:  PinkCheek.be Fact Sheet for Healthcare Providers:  GravelBags.it This test is not yet ap proved or cleared by the [place] FDA and  has been authorized for detection and/or diagnosis of SARS-CoV-2 by FDA under an Emergency Use Authorization (EUA). This EUA will remain  in effect (meaning this test can be used) for the duration of the COVID-19 declaration under Section 564(b)(1) of the Act, 21 U.S.C. section 360bbb-3(b)(1), unless the authorization is terminated  or revoked sooner.    Influenza A by PCR NEGATIVE NEGATIVE Final   Influenza B by PCR NEGATIVE NEGATIVE Final    Comment: (NOTE) The Xpert Xpress SARS-CoV-2/FLU/RSV assay is intended as an aid in  the diagnosis of influenza from Nasopharyngeal swab specimens and  should not be used as a sole basis for treatment. Nasal washings and  aspirates are unacceptable for Xpert Xpress SARS-CoV-2/FLU/RSV  testing. Fact Sheet for Patients: PinkCheek.be Fact Sheet for Healthcare Providers: GravelBags.it This test is not yet approved or cleared by the [place] FDA and  has been authorized for detection and/or diagnosis of SARS-CoV-2 by  FDA under an Emergency Use Authorization (EUA). This EUA will remain  in effect (meaning this test can be used) for the duration of the  Covid-19 declaration under Section 564(b)(1) of the Act, 21  U.S.C. section 360bbb-3(b)(1), unless the authorization is  terminated or revoked. Performed at [facility],  [address], [place], [place] [address]   Urine culture     Status: None   Collection Time: 05/11/19  5:06 PM   Specimen: In/Out Cath Urine  Result Value Ref Range Status   Specimen Description   Final    IN/OUT CATH URINE Performed at [facility], [address], [place], [place] [address]    Special Requests   Final    NONE Performed at [facility], [address], [place], [place] [address]    Culture   Final    NO GROWTH Performed at [facility], [place] [address] [place], [place] [address]    Report Status 05/12/2019 FINAL  Final  MRSA PCR Screening     Status: Abnormal   Collection Time: 05/11/19  5:52 PM   Specimen: Nasopharyngeal  Result Value Ref Range Status   MRSA by PCR POSITIVE (A) NEGATIVE Final    Comment:        The GeneXpert MRSA Assay (FDA approved for NASAL specimens only), is one component of a comprehensive MRSA colonization surveillance program. It is not intended to diagnose MRSA infection nor to guide or monitor treatment for MRSA infections. RESULT CALLED TO, READ BACK BY AND VERIFIED WITH: ANN [facility] 05/11/19 @ 1916  [place] Performed at [facility], [address] [place], [place] [address]     Best Practice/Protocols:  VTE Prophylaxis: Heparin (drip) GI Prophylaxis: Proton Pump Inhibitor Continous Sedation  Events: 12/9 emergent ureteral stent placement, left due to obstructing stone.  Urinary tract infection.  Severe sepsis.  Intubated, mechanically ventilated 12/10 left IJ CVL placed due to hypotension, need for pressors.  MRSA detected in the blood.  Vancomycin added. 12/11-patient successfully liberated from mechanical ventilation  Studies: DG Chest Port 1 View  Result Date: 05/12/2019 CLINICAL DATA:  Central line placement. EXAM: PORTABLE CHEST 1 VIEW COMPARISON:  Radiograph earlier this day. FINDINGS: Left internal jugular central venous catheter tip projects over the upper SVC. No  pneumothorax. Endotracheal tube tip at the thoracic inlet. The enteric tube is in place, tip below the diaphragm not included in the field of view, side-port in the region of the gastroesophageal junction. Low lung volumes. Unchanged heart size and mediastinal contours. Slight worsening interstitial thickening from prior exam. Increasing nodular airspace disease in the left suprahilar lung and left midlung zone. No definite pleural fluid. IMPRESSION: 1. Left internal jugular central venous catheter tip projects over the upper SVC. No pneumothorax. 2. Slight increase interstitial thickening superimposed on chronic bronchitic change, possible pulmonary edema.  More nodular left lung airspace disease is also increasing suspicious for pneumonia. Electronically Signed   By: Keith Rake M.D.   On: 05/12/2019 07:02   DG Chest Port 1 View  Result Date: 05/12/2019 CLINICAL DATA:  Respiratory failure EXAM: PORTABLE CHEST 1 VIEW COMPARISON:  Yesterday FINDINGS: Endotracheal tube tip is just below the clavicular heads. The orogastric tube tip reaches the upper stomach. Artifact from EKG leads that is extensive. Borderline heart size accentuated by low volumes with asymmetric right diaphragm elevation. Generalized interstitial coarsening and a nodular density over the left upper lobe which was not seen on recent priors. No effusion or pneumothorax. IMPRESSION: 1. Unremarkable hardware positioning. 2. Low volume chest with new nodular airspace opacity in the left upper lobe, possible early pneumonia. 3. Chronic bronchitic markings. Electronically Signed   By: Monte Fantasia M.D.   On: 05/12/2019 05:11   DG Chest Port 1 View  Result Date: 05/11/2019 CLINICAL DATA:  Respiratory failure EXAM: PORTABLE CHEST 1 VIEW COMPARISON:  Portable exam 1640 hours compared to 1037 hours FINDINGS: Tip of endotracheal tube projects 4.1 cm above carina. Nasogastric tube extends into stomach. Normal heart size, mediastinal contours,  and pulmonary vascularity. Bibasilar atelectasis and small LEFT pleural effusion. Diffuse accentuation of interstitial markings, could represent edema or infection. No pneumothorax or acute osseous findings. IMPRESSION: Bibasilar atelectasis and small LEFT pleural effusion with scattered interstitial prominence throughout both lungs question minimal pulmonary edema or infection. Electronically Signed   By: Lavonia Dana M.D.   On: 05/11/2019 16:59   DG Chest Port 1 View  Result Date: 05/11/2019 CLINICAL DATA:  Dyspnea. EXAM: PORTABLE CHEST 1 VIEW COMPARISON:  01/22/2019 FINDINGS: Right IJ Port-A-Cath with tip over the SVC. Lungs are hypoinflated with mild prominence of the central pulmonary vessels suggesting mild vascular congestion. No lobar consolidation or effusion. There is subtle patchy density in the mid lungs as could not exclude possible underlying infection. No pneumothorax. Cardiomediastinal silhouette and remainder of the exam is unchanged. IMPRESSION: Findings suggesting mild vascular congestion. Subtle patchy density in the mid lungs which could represent underlying infection. Right IJ Port-A-Cath with tip over the SVC. Electronically Signed   By: Marin Olp M.D.   On: 05/11/2019 11:10   DG OR UROLOGY CYSTO IMAGE ([place])  Result Date: 05/11/2019 There is no interpretation for this exam.  This order is for images obtained during a surgical procedure.  Please See "Surgeries" Tab for more information regarding the procedure.   ECHOCARDIOGRAM COMPLETE  Result Date: 05/12/2019   ECHOCARDIOGRAM REPORT   Patient Name:   MADIX BLOWE Date of Exam: 05/12/2019 Medical Rec #:  [id number]      Height:       72.0 in Accession #:    [id number]     Weight:       198.6 lb Date of Birth:  1956/04/03      BSA:          2.12 m Patient Age:    83 years       BP:           109/60 mmHg Patient Gender: M              HR:           87 bpm. Exam Location:  [facility] Procedure: 2D Echo, Color Doppler and Cardiac  Doppler Indications:     R78.81 Bacteremia  History:         Patient has no prior history of  Echocardiogram examinations.                  Previous Myocardial Infarction; Stroke and COPD. Schizophrenia.  Sonographer:     Charmayne Sheer RDCS (AE) Referring Phys:  [id number] Bradly Bienenstock Diagnosing Phys: Ida Rogue MD  Sonographer Comments: Echo performed with patient supine and on artificial respirator and suboptimal parasternal window. Image acquisition challenging due to COPD. IMPRESSIONS  1. Left ventricular ejection fraction, by visual estimation, is 60 to 65%. The left ventricle has normal function. There is no left ventricular hypertrophy.  2. Left ventricular diastolic parameters are consistent with Grade I diastolic dysfunction (impaired relaxation).  3. The left ventricle has no regional wall motion abnormalities.  4. Global right ventricle has normal systolic function.The right ventricular size is normal. No increase in right ventricular wall thickness.  5. Left atrial size was normal.  6. TR signal is inadequate for assessing pulmonary artery systolic pressure.  7. No valve vegetation noted FINDINGS  Left Ventricle: Left ventricular ejection fraction, by visual estimation, is 60 to 65%. The left ventricle has normal function. The left ventricle has no regional wall motion abnormalities. There is no left ventricular hypertrophy. Left ventricular diastolic parameters are consistent with Grade I diastolic dysfunction (impaired relaxation). Normal left atrial pressure. Right Ventricle: The right ventricular size is normal. No increase in right ventricular wall thickness. Global RV systolic function is has normal systolic function. Left Atrium: Left atrial size was normal in size. Right Atrium: Right atrial size was normal in size Pericardium: There is no evidence of pericardial effusion. Mitral Valve: The mitral valve is normal in structure. No evidence of mitral valve regurgitation. No evidence of mitral  valve stenosis by observation. MV peak gradient, 4.5 mmHg. Tricuspid Valve: The tricuspid valve is normal in structure. Tricuspid valve regurgitation is not demonstrated. Aortic Valve: The aortic valve was not well visualized. Aortic valve regurgitation is not visualized. Aortic regurgitation PHT measures 903 msec. The aortic valve is structurally normal, with no evidence of sclerosis or stenosis. Aortic valve mean gradient measures 7.0 mmHg. Aortic valve peak gradient measures 11.7 mmHg. Aortic valve area, by VTI measures 1.79 cm. Pulmonic Valve: The pulmonic valve was normal in structure. Pulmonic valve regurgitation is not visualized. Pulmonic regurgitation is not visualized. Aorta: The aortic root, ascending aorta and aortic arch are all structurally normal, with no evidence of dilitation or obstruction. Venous: The inferior vena cava is normal in size with greater than 50% respiratory variability, suggesting right atrial pressure of 3 mmHg. IAS/Shunts: No atrial level shunt detected by color flow Doppler. There is no evidence of a patent foramen ovale. No ventricular septal defect is seen or detected. There is no evidence of an atrial septal defect.  LEFT VENTRICLE PLAX 2D LVIDd:         4.70 cm  Diastology LVIDs:         2.59 cm  LV e' lateral:   5.98 cm/s LV PW:         0.82 cm  LV E/e' lateral: 12.4 LV IVS:        0.66 cm  LV e' medial:    6.74 cm/s LVOT diam:     1.70 cm  LV E/e' medial:  11.0 LV SV:         78 ml LV SV Index:   36.23 LVOT Area:     2.27 cm  RIGHT VENTRICLE RV Basal diam:  2.93 cm LEFT ATRIUM  Index       RIGHT ATRIUM           Index LA diam:      2.90 cm 1.37 cm/m  RA Area:     10.00 cm LA Vol (A4C): 30.1 ml 14.17 ml/m RA Volume:   19.50 ml  9.18 ml/m  AORTIC VALVE                    PULMONIC VALVE AV Area (Vmax):    1.69 cm     PV Vmax:       0.94 m/s AV Area (Vmean):   1.65 cm     PV Vmean:      61.800 cm/s AV Area (VTI):     1.79 cm     PV VTI:        0.171 m AV Vmax:            171.00 cm/s  PV Peak grad:  3.5 mmHg AV Vmean:          122.000 cm/s PV Mean grad:  2.0 mmHg AV VTI:            0.299 m AV Peak Grad:      11.7 mmHg AV Mean Grad:      7.0 mmHg LVOT Vmax:         127.00 cm/s LVOT Vmean:        88.600 cm/s LVOT VTI:          0.236 m LVOT/AV VTI ratio: 0.79 AI PHT:            903 msec  AORTA Ao Root diam: 3.30 cm MITRAL VALVE MV Area (PHT): 4.80 cm             SHUNTS MV Peak grad:  4.5 mmHg             Systemic VTI:  0.24 m MV Mean grad:  2.0 mmHg             Systemic Diam: 1.70 cm MV Vmax:       1.06 m/s MV Vmean:      61.5 cm/s MV VTI:        0.19 m MV PHT:        45.82 msec MV Decel Time: 158 msec MV E velocity: 74.20 cm/s 103 cm/s MV A velocity: 98.50 cm/s 70.3 cm/s MV E/A ratio:  0.75       1.5  Ida Rogue MD Electronically signed by Ida Rogue MD Signature Date/Time: 05/12/2019/7:11:57 PM    Final    CT Renal Stone Study  Result Date: 05/11/2019 CLINICAL DATA:  Flank pain. Recurrent disease suspected. EXAM: CT ABDOMEN AND PELVIS WITHOUT CONTRAST TECHNIQUE: Multidetector CT imaging of the abdomen and pelvis was performed following the standard protocol without IV contrast. COMPARISON:  CT of the abdomen and pelvis without contrast 3/20/. FINDINGS: Lower chest: Patchy airspace opacities are present in the lower lobes bilaterally. Heart size is normal. No significant pleural or pericardial effusion present. Hepatobiliary: Hepatic cysts are stable. No new lesions are present. The largest lesion laterally in inferior right lower lobe measuring 18 mm. The common bile duct and gallbladder are normal. Pancreas: Unremarkable. No pancreatic ductal dilatation or surrounding inflammatory changes. Spleen: Normal in size without focal abnormality. Adrenals/Urinary Tract: Adrenal glands are normal bilaterally. Right nephrectomy noted. Prominent staghorn calculus seen on the prior study is near completely resolved. Prominent parenchymal calcification in the midportion of  the left kidney measures 11  mm. Punctate calcification near the lower pole of the left kidney is stable. Mild prominence of the left ureter present above the level of a partially obstructing 5 mm stone at pelvic inlet. More distal ureter is normal. The urinary bladder is within limits. Stomach/Bowel: Stomach and duodenum are within normal limits. Bowel is unremarkable. Terminal ileum is normal. The ascending and transverse colon are within normal limits. Descending and sigmoid colon are normal. Vascular/Lymphatic: Atherosclerotic calcifications are present in the aorta and branch vessels without aneurysm. Reproductive: Dense calcifications are present prostate gland. No discrete lesion is present. Other: Choose 1 Musculoskeletal: No acute or significant osseous findings. IMPRESSION: 1. Partially obstructing 5 mm stone at the left pelvic inlet. 2. Mild prominence of the left ureter above the level the stone. 3. Marked reduction in stone burden in the left kidney. 4. Right nephrectomy. 5. Stable hepatic cysts. 6. Aortic Atherosclerosis (ICD10-I70.0). 7. Patchy airspace disease the lung bases bilaterally raises concern for infection. Electronically Signed   By: San Morelle M.D.   On: 05/11/2019 11:52    Consults: Treatment Team:  Hollice Espy, MD   Subjective:    Overnight Issues: Developed hypotension early this a.m.  Require central line placement as noted above.  Had some inferior lead ST elevation on ECG.  Troponin normal.  Objective:  Vital signs for last 24 hours: Temp:  [98.8 F (37.1 C)-101.1 F (38.4 C)] 99.7 F (37.6 C) (12/12 1200) Pulse Rate:  [79-116] 96 (12/12 1200) Resp:  [14-36] 17 (12/12 1200) BP: (106-158)/(69-92) 155/91 (12/12 1100) SpO2:  [89 %-100 %] 92 % (12/12 1200) FiO2 (%):  [35 %] 35 % (12/12 0400) Weight:  [89.1 kg] 89.1 kg (12/12 0403)  Hemodynamic parameters for last 24 hours:    Intake/Output from previous day: 12/11 0701 - 12/12 0700 In: 1519.4  [I.V.:1002.9; IV Piggyback:516.4] Out: 0737 [Urine:3840; Emesis/NG output:100]  Intake/Output this shift: Total I/O In: 642.4 [I.V.:392.3; IV Piggyback:250] Out: 840 [Urine:840]  Vent settings for last 24 hours: FiO2 (%):  [35 %] 35 %  Physical Exam:  Constitutional:   Diaphoretic. Chronically ill-appearing, appears older than stated age.   HENT:  Head: Normocephalic and atraumatic.  Orotracheally intubated.  Oral mucosa moist.  Eyes: Pupils are equal, round, and reactive to light. Conjunctivae are normal. No scleral icterus.  Neck: No JVD present. No tracheal deviation present. No thyromegaly present.  Cardiovascular: Normal rate and regular rhythm.  Respiratory:  Rhonchi noted, some mild wheezing.[name] He has no rales.  GI: Soft. Bowel sounds are normal. He exhibits no distension.  Musculoskeletal:No deformity or edema.  Neurological:  Sedated no further assessment can be made  Skin: Skin is warm and dry.    Chest x-ray reviewed independently.  All lab data reviewed.  Medications reviewed with Pharm.D.   Assessment/Plan:  Acute respiratory failure due to: COPD exacerbation in the setting of: Severe sepsis Extubated  No steroids currently  Pulmonary toilet Antibiotic therapy    Severe sepsis with septic shock Obstructing renal calculus Urinary tract infection Known history of ESBL MRSA bacteremia Continue volume resuscitation/support Underwent emergent ureteral stent placement On meropenem Follow culture results ID consultation for MRSA bacteremia TEE in progress Repeat blood cultures Appropriate isolation for ESBL history and now MRSA Periodic check on chest x-ray as does have some nodular infiltrates left upper lobe may indicate septic emboli/early cavitating lesions.  ST changes on inferior leads May be demand ischemia Heparin changed to heparin infusion Cycle troponins Cardiac monitoring   Acute on chronic renal  failure Solitary kidney Avoid  nephrotoxins Maintain perfusion, avoid hypotension Volume resuscitate Discontinued sodium bicarb infusion Pharmacy to replace electrolytes Dose antibiotics according to renal function  Paranoid schizophrenia This issue adds complexity to his management Resume home medications as soon as feasible  DVT prophylaxis:heparin SQ switched to heparin infusion GI prophylax:PPI    LOS: 3 days   Additional comments:No family available for update.  Critical care provider statement:    Critical care time (minutes):  33   Critical care time was exclusive of:  Separately billable procedures and  treating other patients   Critical care was necessary to treat or prevent imminent or  life-threatening deterioration of the following conditions:   MRSA bacteremia, circulatory shock, sepsis, paranoid schizophrenia, urinary tract infection, COPD exacerbation, multiple comorbid conditions   Critical care was time spent personally by me on the following  activities:  Development of treatment plan with patient or surrogate,  discussions with consultants, evaluation of patient's response to  treatment, examination of patient, obtaining history from patient or  surrogate, ordering and performing treatments and interventions, ordering  and review of laboratory studies and re-evaluation of patient's condition   I assumed direction of critical care for this patient from another  provider in my specialty: no    *Care during the described time interval was provided by me and/or other providers on the critical care team.  I have reviewed this patient's available data, including medical history, events of note, physical examination and test results as part of my evaluation.  **This note was dictated using voice recognition software/Dragon.  Despite best efforts to proofread, errors can occur which can change the meaning.  Any change was purely unintentional.

## 2019-05-15 ENCOUNTER — Inpatient Hospital Stay: Payer: Medicare Other

## 2019-05-15 LAB — MAGNESIUM: Magnesium: 2.5 mg/dL — ABNORMAL HIGH (ref 1.7–2.4)

## 2019-05-15 LAB — BASIC METABOLIC PANEL
Anion gap: 8 (ref 5–15)
BUN: 53 mg/dL — ABNORMAL HIGH (ref 8–23)
CO2: 26 mmol/L (ref 22–32)
Calcium: 8.4 mg/dL — ABNORMAL LOW (ref 8.9–10.3)
Chloride: 117 mmol/L — ABNORMAL HIGH (ref 98–111)
Creatinine, Ser: 2.26 mg/dL — ABNORMAL HIGH (ref 0.61–1.24)
GFR calc Af Amer: 34 mL/min — ABNORMAL LOW (ref >60)
GFR calc non Af Amer: 30 mL/min — ABNORMAL LOW (ref >60)
Glucose, Bld: 101 mg/dL — ABNORMAL HIGH (ref 70–99)
Potassium: 4 mmol/L (ref 3.5–5.1)
Sodium: 151 mmol/L — ABNORMAL HIGH (ref 135–145)

## 2019-05-15 LAB — GLUCOSE, CAPILLARY
Glucose-Capillary: 102 mg/dL — ABNORMAL HIGH (ref 70–99)
Glucose-Capillary: 112 mg/dL — ABNORMAL HIGH (ref 70–99)
Glucose-Capillary: 114 mg/dL — ABNORMAL HIGH (ref 70–99)
Glucose-Capillary: 84 mg/dL (ref 70–99)
Glucose-Capillary: 96 mg/dL (ref 70–99)
Glucose-Capillary: 98 mg/dL (ref 70–99)

## 2019-05-15 LAB — SODIUM: Sodium: 151 mmol/L — ABNORMAL HIGH (ref 135–145)

## 2019-05-15 LAB — PHOSPHORUS: Phosphorus: 3.2 mg/dL (ref 2.5–4.6)

## 2019-05-15 MED ORDER — ORAL CARE MOUTH RINSE
15.0000 mL | Freq: Two times a day (BID) | OROMUCOSAL | Status: DC
  Administered 2019-05-15 – 2019-05-24 (×15): 15 mL via OROMUCOSAL

## 2019-05-15 MED ORDER — DEXTROSE 5 % IV SOLN
INTRAVENOUS | Status: DC
  Administered 2019-05-15 – 2019-05-16 (×2): via INTRAVENOUS
  Administered 2019-05-16: 100 mL/h via INTRAVENOUS
  Administered 2019-05-16: via INTRAVENOUS

## 2019-05-15 MED ORDER — FUROSEMIDE 10 MG/ML IJ SOLN
40.0000 mg | Freq: Every day | INTRAMUSCULAR | Status: DC
  Administered 2019-05-15 – 2019-05-17 (×3): 40 mg via INTRAVENOUS
  Filled 2019-05-15 (×3): qty 4

## 2019-05-15 MED ORDER — LORAZEPAM 2 MG/ML IJ SOLN: 2.0000 mg | Freq: Once | INTRAMUSCULAR | Status: DC

## 2019-05-15 MED ORDER — SODIUM CHLORIDE 0.9 % IV SOLN
INTRAVENOUS | Status: DC | PRN
  Administered 2019-05-15: 13:00:00 via INTRAVENOUS
  Administered 2019-05-22: 50 mL via INTRAVENOUS

## 2019-05-15 MED ORDER — ACETAMINOPHEN 10 MG/ML IV SOLN
500.0000 mg | Freq: Four times a day (QID) | INTRAVENOUS | Status: DC | PRN
  Administered 2019-05-16: 500 mg via INTRAVENOUS
  Filled 2019-05-15: qty 100

## 2019-05-15 MED ORDER — CHLORHEXIDINE GLUCONATE 0.12 % MT SOLN
15.0000 mL | Freq: Two times a day (BID) | OROMUCOSAL | Status: DC
  Administered 2019-05-15 – 2019-05-24 (×16): 15 mL via OROMUCOSAL
  Filled 2019-05-15 (×14): qty 15

## 2019-05-15 MED ORDER — LORAZEPAM 2 MG/ML IJ SOLN
INTRAMUSCULAR | Status: AC
  Filled 2019-05-15: qty 1
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
## 2019-05-15 NOTE — Progress Notes (Addendum)
Cardiology--TEE  TEE has been ordered given patient's hx of bacteremia Chart reviewed   No obvous contraindications for procedure  Pt is not able to make informed decisions.  Legal guardian is Consulting civil engineer work phone is [phone].    WIll ned to try in AM  Tentative plan for procedure   WIll place order, keep NPO after MN  Dorris Carnes MD

## 2019-05-16 DIAGNOSIS — R451 Restlessness and agitation: Secondary | ICD-10-CM

## 2019-05-16 LAB — GLUCOSE, CAPILLARY
Glucose-Capillary: 101 mg/dL — ABNORMAL HIGH (ref 70–99)
Glucose-Capillary: 102 mg/dL — ABNORMAL HIGH (ref 70–99)
Glucose-Capillary: 102 mg/dL — ABNORMAL HIGH (ref 70–99)
Glucose-Capillary: 109 mg/dL — ABNORMAL HIGH (ref 70–99)
Glucose-Capillary: 115 mg/dL — ABNORMAL HIGH (ref 70–99)
Glucose-Capillary: 123 mg/dL — ABNORMAL HIGH (ref 70–99)
Glucose-Capillary: 96 mg/dL (ref 70–99)

## 2019-05-16 MED ORDER — PHENOBARBITAL SODIUM 65 MG/ML IJ SOLN
65.0000 mg | Freq: Four times a day (QID) | INTRAMUSCULAR | Status: AC
  Administered 2019-05-16 (×2): 65 mg via INTRAVENOUS
  Filled 2019-05-16 (×3): qty 1

## 2019-05-16 MED ORDER — LORAZEPAM 2 MG/ML IJ SOLN: 1.0000 mg | Freq: Four times a day (QID) | INTRAMUSCULAR | Status: DC | PRN

## 2019-05-16 MED ORDER — PHENOBARBITAL SODIUM 65 MG/ML IJ SOLN
65.0000 mg | Freq: Two times a day (BID) | INTRAMUSCULAR | Status: AC
  Administered 2019-05-17 (×2): 65 mg via INTRAVENOUS
  Filled 2019-05-16 (×2): qty 1

## 2019-05-16 MED ORDER — SODIUM CHLORIDE 0.9% FLUSH: 10.0000 mL | INTRAVENOUS | Status: DC | PRN

## 2019-05-16 MED ORDER — PHENOBARBITAL SODIUM 65 MG/ML IJ SOLN
30.0000 mg | Freq: Two times a day (BID) | INTRAMUSCULAR | Status: DC
  Administered 2019-05-18: 30 mg via INTRAVENOUS
  Filled 2019-05-16: qty 1

## 2019-05-16 MED ORDER — PHENOBARBITAL SODIUM 65 MG/ML IJ SOLN: 65.0000 mg | Freq: Two times a day (BID) | INTRAMUSCULAR | Status: DC

## 2019-05-16 MED ORDER — SODIUM CHLORIDE 0.9% FLUSH
10.0000 mL | Freq: Two times a day (BID) | INTRAVENOUS | Status: DC
  Administered 2019-05-16 – 2019-05-17 (×4): 10 mL

## 2019-05-16 MED ORDER — PHENOBARBITAL SODIUM 65 MG/ML IJ SOLN: 30.0000 mg | Freq: Two times a day (BID) | INTRAMUSCULAR | Status: DC

## 2019-05-16 NOTE — Progress Notes (Signed)
We were asked to do a TEE on this patient but he continues to be agitated and does not follow commands.  Not able to do TEE unless his mental status improves.

## 2019-05-16 NOTE — Progress Notes (Signed)
Remains on Precedex, agitated, and requiring a sitter. Talking to his imaginary friends. States he cannot understand. Understand what I do not  Know.

## 2019-05-16 NOTE — Progress Notes (Signed)
Date of Admission:  05/11/2019     ID: Alan Small is a 63 y.o. male  Active Problems:   Severe sepsis ([place])    Subjective: Pt is awake but confused and agitated at times  Medications:  . chlorhexidine  15 mL Mouth Rinse BID  . Chlorhexidine Gluconate Cloth  6 each Topical Daily  . furosemide  40 mg Intravenous Daily  . heparin  5,000 Units Subcutaneous Q8H  . ipratropium-albuterol  3 mL Nebulization Q6H  . LORazepam  2 mg Intravenous Once  . mouth rinse  15 mL Mouth Rinse q12n4p  . multivitamin  15 mL Oral Daily  . mupirocin ointment  1 application Nasal BID  . nicotine  21 mg Transdermal Daily  . pantoprazole (PROTONIX) IV  40 mg Intravenous QHS  . sodium chloride flush  10-40 mL Intracatheter Q12H    Objective: Vital signs in last 24 hours: Temp:  [99 F (37.2 C)-101.5 F (38.6 C)] 99 F (37.2 C) (12/14 0600) Pulse Rate:  [70-90] 77 (12/14 0600) Resp:  [18-24] 23 (12/14 0600) BP: (129-162)/(70-86) 129/70 (12/14 0600) SpO2:  [93 %-96 %] 94 % (12/14 0600) Weight:  [82.9 kg] 82.9 kg (12/14 0500)  PHYSICAL EXAM:  General: agitated, some confusion Left IJ Back: did not examine Lungs: b/l air ntryHeart: Regular rate and rhythm, no murmur, rub or gallop. Abdomen: Soft, non-tender,not distended. Bowel sounds normal. No masses Extremities: no rash Skin: No rashes or lesions. Or bruising Lymph: Cervical, supraclavicular normal. Neurologic: cannot be assessed Lab Results Recent Labs    05/14/19 0434 05/15/19 0516 05/15/19 1816  WBC 13.3*  --   --   HGB 10.3*  --   --   HCT 32.5*  --   --   NA 147* 151* 151*  K 3.5 4.0  --   CL 112* 117*  --   CO2 27 26  --   BUN 56* 53*  --   CREATININE 2.60* 2.26*  --    Liver Panel Recent Labs    05/14/19 0434  ALBUMIN 2.0*   Sedimentation Rate No results for input(s): ESRSEDRATE in the last 72 hours. C-Reactive Protein No results for input(s): CRP in the last 72 hours.  Microbiology:  Studies/Results: CT  ABDOMEN PELVIS WO CONTRAST  Result Date: 05/15/2019 CLINICAL DATA:  Right-sided edema and warmth and redness. Urinary tract infection. Recent placement of left ureteral stent. EXAM: CT CHEST, ABDOMEN AND PELVIS WITHOUT CONTRAST TECHNIQUE: Multidetector CT imaging of the chest, abdomen and pelvis was performed following the standard protocol without IV contrast. COMPARISON:  CT scans dated 05/11/2019 and 08/20/2018 FINDINGS: CT CHEST FINDINGS Cardiovascular: Slight aortic atherosclerosis. Heart size is normal. No pericardial effusion. However, there is extensive acute soft tissue stranding in the left pericardial fat pad, likely infectious. Mediastinum/Nodes: New extensive soft tissue stranding pericardial fat pad worrisome for infection. No mediastinal adenopathy. Thyroid gland is atrophic. Tiny hiatal hernia. Lungs/Pleura: There are multiple cavitary lesions in the left lung, likely representing septic emboli. There is an irregular 2.2 cm nodule in the left upper lobe which could represent a mass but is probably a septic embolus which is not yet cavitated. There is a 5 mm lesion in the lateral aspect of the left upper lobe which has not yet cavitated. Hazy infiltrate at the right lung apex with a small nodule posterior medially at the apex. Consolidation at the inferior aspect of the right hilum. There are new small bilateral pleural effusions, left greater than right  with atelectasis at both lung bases. Musculoskeletal: There is slight soft tissue stranding around the inferior aspect of the right latissimus dorsi muscle soft tissue stranding in the subcutaneous fat of both flanks, right greater than left. No acute bone abnormality. CT ABDOMEN PELVIS FINDINGS Hepatobiliary: 9 cm cyst on the tip of the left lobe of the liver in the left upper quadrant 2 cm cyst in the caudate lobe of the liver, unchanged. There are 216 mm cysts in the inferior aspect of the right lobe of the liver also unchanged. Biliary tree is  normal. Pancreas: Unremarkable. No pancreatic ductal dilatation or surrounding inflammatory changes. Spleen: Normal in size without focal abnormality. Adrenals/Urinary Tract: Adrenal glands are normal. Left ureteral stent in place. Stone in the mid left kidney, unchanged. No hydronephrosis. Previous right nephrectomy. Foley catheter in the bladder. Stomach/Bowel: Stomach is within normal limits. Appendix has been removed. No evidence of bowel wall thickening, distention, or inflammatory changes. Vascular/Lymphatic: Aortic atherosclerosis. No enlarged abdominal or pelvic lymph nodes. Reproductive: Numerous benign-appearing calcifications in the prostate gland. Other: No abdominal wall hernia or abnormality. No abdominopelvic ascites. Musculoskeletal: There is abnormal expansion of the muscles of the lateral aspect of the right abdominal wall with lucency in that area, worrisome for an intramuscular abscess. There is soft tissue stranding in the adjacent subcutaneous fat. Soft tissue stranding seen around the distal right latissimus Dorsey muscle and that muscle is slightly hypertrophied as compared to the left and may also be involved infection. There is subcutaneous edema at the lateral aspect of both hips, right more than left. No significant bone abnormality. IMPRESSION: 1. Multiple cavitary lesions in the left lung consistent with septic emboli. Abnormal soft tissue stranding in the prominent left pericardial fat pad also worrisome for infection. 2. Abnormal expansion of the muscles of the lateral aspect of the right abdominal wall with lucency in that area, worrisome for an intramuscular abscess/infectious myositis. 3. Soft tissue stranding around the inferior aspect of the right latissimus dorsi muscle and in the subcutaneous fat of both flanks, right greater than left, consistent with cellulitis. 4. New small bilateral pleural effusions, left greater than right. 5. Aortic Atherosclerosis (ICD10-I70.0). 6. New  left ureteral stent appears in good position. No acute abnormalities of the left kidney. Electronically Signed   By: Lorriane Shire M.D.   On: 05/15/2019 16:02   CT CHEST WO CONTRAST  Result Date: 05/15/2019 CLINICAL DATA:  Right-sided edema and warmth and redness. Urinary tract infection. Recent placement of left ureteral stent. EXAM: CT CHEST, ABDOMEN AND PELVIS WITHOUT CONTRAST TECHNIQUE: Multidetector CT imaging of the chest, abdomen and pelvis was performed following the standard protocol without IV contrast. COMPARISON:  CT scans dated 05/11/2019 and 08/20/2018 FINDINGS: CT CHEST FINDINGS Cardiovascular: Slight aortic atherosclerosis. Heart size is normal. No pericardial effusion. However, there is extensive acute soft tissue stranding in the left pericardial fat pad, likely infectious. Mediastinum/Nodes: New extensive soft tissue stranding pericardial fat pad worrisome for infection. No mediastinal adenopathy. Thyroid gland is atrophic. Tiny hiatal hernia. Lungs/Pleura: There are multiple cavitary lesions in the left lung, likely representing septic emboli. There is an irregular 2.2 cm nodule in the left upper lobe which could represent a mass but is probably a septic embolus which is not yet cavitated. There is a 5 mm lesion in the lateral aspect of the left upper lobe which has not yet cavitated. Hazy infiltrate at the right lung apex with a small nodule posterior medially at the apex. Consolidation at the  inferior aspect of the right hilum. There are new small bilateral pleural effusions, left greater than right with atelectasis at both lung bases. Musculoskeletal: There is slight soft tissue stranding around the inferior aspect of the right latissimus dorsi muscle soft tissue stranding in the subcutaneous fat of both flanks, right greater than left. No acute bone abnormality. CT ABDOMEN PELVIS FINDINGS Hepatobiliary: 9 cm cyst on the tip of the left lobe of the liver in the left upper quadrant 2 cm  cyst in the caudate lobe of the liver, unchanged. There are 216 mm cysts in the inferior aspect of the right lobe of the liver also unchanged. Biliary tree is normal. Pancreas: Unremarkable. No pancreatic ductal dilatation or surrounding inflammatory changes. Spleen: Normal in size without focal abnormality. Adrenals/Urinary Tract: Adrenal glands are normal. Left ureteral stent in place. Stone in the mid left kidney, unchanged. No hydronephrosis. Previous right nephrectomy. Foley catheter in the bladder. Stomach/Bowel: Stomach is within normal limits. Appendix has been removed. No evidence of bowel wall thickening, distention, or inflammatory changes. Vascular/Lymphatic: Aortic atherosclerosis. No enlarged abdominal or pelvic lymph nodes. Reproductive: Numerous benign-appearing calcifications in the prostate gland. Other: No abdominal wall hernia or abnormality. No abdominopelvic ascites. Musculoskeletal: There is abnormal expansion of the muscles of the lateral aspect of the right abdominal wall with lucency in that area, worrisome for an intramuscular abscess. There is soft tissue stranding in the adjacent subcutaneous fat. Soft tissue stranding seen around the distal right latissimus Dorsey muscle and that muscle is slightly hypertrophied as compared to the left and may also be involved infection. There is subcutaneous edema at the lateral aspect of both hips, right more than left. No significant bone abnormality. IMPRESSION: 1. Multiple cavitary lesions in the left lung consistent with septic emboli. Abnormal soft tissue stranding in the prominent left pericardial fat pad also worrisome for infection. 2. Abnormal expansion of the muscles of the lateral aspect of the right abdominal wall with lucency in that area, worrisome for an intramuscular abscess/infectious myositis. 3. Soft tissue stranding around the inferior aspect of the right latissimus dorsi muscle and in the subcutaneous fat of both flanks, right  greater than left, consistent with cellulitis. 4. New small bilateral pleural effusions, left greater than right. 5. Aortic Atherosclerosis (ICD10-I70.0). 6. New left ureteral stent appears in good position. No acute abnormalities of the left kidney. Electronically Signed   By: Lorriane Shire M.D.   On: 05/15/2019 16:02   DG Chest Port 1 View  Result Date: 05/15/2019 CLINICAL DATA:  Pulmonary disease, cough EXAM: PORTABLE CHEST 1 VIEW COMPARISON:  05/12/2019 FINDINGS: Interval endotracheal and esophagogastric extubation. Left neck vascular catheter, tip projecting over the lower SVC. Diffuse interstitial pulmonary opacity, similar to prior examination. Cardiomegaly. IMPRESSION: 1.  Interval endotracheal and esophagogastric extubation. 2. Diffuse interstitial pulmonary opacity, similar to prior examination, and consistent with edema or infection. No new airspace opacity. 3.  Cardiomegaly. Electronically Signed   By: Eddie Candle M.D.   On: 05/15/2019 15:03     Assessment/Plan:  MRSA bacteremia secondary to the Central line which was present on admission and has been removed TEE will be done once his mental status improves and he is not agitated On ceftaroline and repeat culture negative  Left ureteric stone - new stent placed this admission  CKD- improving  Acute hypoxic resp failure- s/p extubation  Complicated urological history of the left kidney, with stag horn calcus, obstructive uropathy, stent, nephrostomy, retroperitoneal abscess, lithotripsy, stent removal and 8 weeks  of Iv antibiotics which he finished 04/16/19   Discussed the management with his nurse.

## 2019-05-16 NOTE — Consult Note (Signed)
Patient ID: Alan Small, male   DOB: May 18, 1956, 63 y.o.   MRN: [id number]  Chief Complaint: CT changes, right flank edema.   History of Present Illness Alan Small is a 63 y.o. male with a complex medical history and a solitary left kidney who underwent emergency ureteral stent placement for a partially obstructing ureteral calculus. MRSA a septicemia diagnosed and treated initially with vancomycin.  He was subsequently extubated, and then began displaying evidence of alcohol withdrawal and was started on Precedex.  Severe agitation then followed with the notation of right flank increased to lower, edema, and a pink hue noted to the skin along the right flank and right buttock. CT scan examination of the chest abdomen and pelvis were obtained showed the following:  IMPRESSION: 1. Multiple cavitary lesions in the left lung consistent with septic emboli. Abnormal soft tissue stranding in the prominent left pericardial fat pad also worrisome for infection. 2. Abnormal expansion of the muscles of the lateral aspect of the right abdominal wall with lucency in that area, worrisome for an intramuscular abscess/infectious myositis. 3. Soft tissue stranding around the inferior aspect of the right latissimus dorsi muscle and in the subcutaneous fat of both flanks, right greater than left, consistent with cellulitis.  For these findings I was consulted by Dr.Fuad Lanney Gins, M.D.   Past Medical History Past Medical History:  Diagnosis Date  . Asthma   . COPD (chronic obstructive pulmonary disease) ([place])   . Depression   . External hemorrhoids   . Gastric ulcer   . GERD (gastroesophageal reflux disease)   . Hydronephrosis   . Hypothyroidism    hypo  . Insomnia   . Mental disorder   . Myocardial infarction ([place])   . Paranoid schizophrenia ([place])   . Renal disorder    renal failure  . Seizures ([place])   . Stroke [facility])       Past Surgical History:  Procedure Laterality Date  .  APPENDECTOMY    . COLONOSCOPY WITH PROPOFOL N/A 12/07/2014   Procedure: COLONOSCOPY WITH PROPOFOL;  Surgeon: Manya Silvas, MD;  Location: [facility] ENDOSCOPY;  Service: Endoscopy;  Laterality: N/A;  . COLONOSCOPY WITH PROPOFOL N/A 10/10/2016   Procedure: COLONOSCOPY WITH PROPOFOL;  Surgeon: Lucilla Lame, MD;  Location: [place];  Service: Endoscopy;  Laterality: N/A;  . CYSTOSCOPY W/ URETERAL STENT PLACEMENT Left 01/21/2019   Procedure: CYSTOSCOPY WITH RETROGRADE PYELOGRAM/URETERAL STENT PLACEMENT;  Surgeon: Abbie Sons, MD;  Location: [facility] ORS;  Service: Urology;  Laterality: Left;  . CYSTOSCOPY WITH URETEROSCOPY AND STENT PLACEMENT Left 05/11/2019   Procedure: CYSTOSCOPY WITH URETEROSCOPY AND STENT PLACEMENT, LEFT;  Surgeon: Hollice Espy, MD;  Location: [facility] ORS;  Service: Urology;  Laterality: Left;  . ESOPHAGOGASTRODUODENOSCOPY N/A 12/07/2014   Procedure: ESOPHAGOGASTRODUODENOSCOPY (EGD);  Surgeon: Manya Silvas, MD;  Location: [facility] ENDOSCOPY;  Service: Endoscopy;  Laterality: N/A;  . NEPHRECTOMY    . POLYPECTOMY  10/10/2016   Procedure: POLYPECTOMY;  Surgeon: Lucilla Lame, MD;  Location: [place];  Service: Endoscopy;;  . SUPRAPUBIC CATHETER INSERTION      Allergies  Allergen Reactions  . Mellaril [Thioridazine] Shortness Of Breath and Swelling  . Buprenorphine Itching  . Morphine And Related Itching  . Navane [Thiothixene] Other (See Comments)    Reaction:  GI upset   . Thorazine [Chlorpromazine] Other (See Comments)    Reaction:  Dizziness/fainting     Current Facility-Administered Medications  Medication Dose Route Frequency Provider Last Rate Last Admin  .  0.9 %  sodium chloride infusion   Intravenous PRN Ottie Glazier, MD 10 mL/hr at 05/15/19 1800 Rate Verify at 05/15/19 1800  . acetaminophen (OFIRMEV) IV 500 mg  500 mg Intravenous Q6H PRN Tukov-Yual, Arlyss Gandy, NP   Stopped at 05/16/19 0037  . acetaminophen (TYLENOL) tablet 650 mg  650 mg Oral  Q4H PRN Tyler Pita, MD      . albuterol (PROVENTIL) (2.5 MG/3ML) 0.083% nebulizer solution 2.5 mg  2.5 mg Nebulization Q4H PRN Tyler Pita, MD      . ceftaroline (TEFLARO) 400 mg in sodium chloride 0.9 % 250 mL IVPB  400 mg Intravenous Q12H Tsosie Billing, MD   Stopped at 05/16/19 0009  . chlorhexidine (PERIDEX) 0.12 % solution 15 mL  15 mL Mouth Rinse BID Ottie Glazier, MD   15 mL at 05/15/19 2109  . Chlorhexidine Gluconate Cloth 2 % PADS 6 each  6 each Topical Daily Bradly Bienenstock, NP   6 each at 05/15/19 1006  . dexmedetomidine (PRECEDEX) 400 MCG/100ML (4 mcg/mL) infusion  0.4-1.2 mcg/kg/hr Intravenous Titrated Ottie Glazier, MD 18.7 mL/hr at 05/16/19 0831 0.8 mcg/kg/hr at 05/16/19 0831  . dextrose 5 % solution   Intravenous Continuous Ottie Glazier, MD 100 mL/hr at 05/16/19 0700 Rate Verify at 05/16/19 0700  . diphenhydrAMINE (BENADRYL) injection 50 mg  50 mg Intravenous TID BM Ottie Glazier, MD   50 mg at 05/15/19 1826  . fentaNYL 2570mcg in NS 244mL (72mcg/ml) infusion-PREMIX  0-400 mcg/hr Intravenous Continuous Tyler Pita, MD   Stopped at 05/13/19 1449  . furosemide (LASIX) injection 40 mg  40 mg Intravenous Daily Ottie Glazier, MD   40 mg at 05/15/19 1408  . heparin injection 5,000 Units  5,000 Units Subcutaneous Q8H Tyler Pita, MD   5,000 Units at 05/16/19 0600  . ipratropium-albuterol (DUONEB) 0.5-2.5 (3) MG/3ML nebulizer solution 3 mL  3 mL Nebulization Q6H Tyler Pita, MD   3 mL at 05/16/19 0849  . LORazepam (ATIVAN) injection 2 mg  2 mg Intravenous Once Ottie Glazier, MD      . MEDLINE mouth rinse  15 mL Mouth Rinse q12n4p Ottie Glazier, MD   15 mL at 05/15/19 1632  . midazolam (VERSED) injection 2 mg  2 mg Intravenous Q15 min PRN Bradly Bienenstock, NP   2 mg at 05/13/19 2053  . midazolam (VERSED) injection 2 mg  2 mg Intravenous Q2H PRN Darel Hong D, NP   2 mg at 05/14/19 0113  . multivitamin liquid 15 mL  15 mL Oral  Daily Ottie Glazier, MD   15 mL at 05/14/19 1224  . mupirocin ointment (BACTROBAN) 2 % 1 application  1 application Nasal BID Flora Lipps, MD   1 application at 91/47/82 2111  . nicotine (NICODERM CQ - dosed in mg/24 hours) patch 21 mg  21 mg Transdermal Daily Ottie Glazier, MD   21 mg at 05/15/19 1006  . norepinephrine (LEVOPHED) 16 mg in 272mL premix infusion  0-40 mcg/min Intravenous Titrated Darel Hong D, NP 4.69 mL/hr at 05/12/19 1000 5 mcg/min at 05/12/19 1000  . ondansetron (ZOFRAN) injection 4 mg  4 mg Intravenous Q6H PRN Tyler Pita, MD      . pantoprazole (PROTONIX) injection 40 mg  40 mg Intravenous QHS Tyler Pita, MD   40 mg at 05/15/19 2108  . propofol (DIPRIVAN) 1000 MG/100ML infusion  5-80 mcg/kg/min Intravenous Titrated Tyler Pita, MD   Stopped at 05/13/19 1308  .  sodium chloride flush (NS) 0.9 % injection 10-40 mL  10-40 mL Intracatheter Q12H Tukov-Yual, Magdalene S, NP      . sodium chloride flush (NS) 0.9 % injection 10-40 mL  10-40 mL Intracatheter PRN Tukov-Yual, Magdalene S, NP      . valproate (DEPACON) 446 mg in dextrose 5 % 50 mL IVPB  10 mg/kg/day Intravenous Q12H Ottie Glazier, MD   Stopped at 05/15/19 2210    Family History Family History  Problem Relation Age of Onset  . Stroke Father   . Pneumonia Father   . Brain cancer Mother       Social History Social History   Tobacco Use  . Smoking status: Current Every Day Smoker    Packs/day: 1.00    Years: 55.00    Pack years: 55.00    Types: Cigarettes  . Smokeless tobacco: Former Systems developer    Types: Chew  Substance Use Topics  . Alcohol use: No  . Drug use: No        Review of Systems  Unable to perform ROS: Acuity of condition     Physical Exam Blood pressure 129/70, pulse 77, temperature 99 F (37.2 C), temperature source Oral, resp. rate (!) 23, height 6' 0.01" (1.829 m), weight 82.9 kg, SpO2 94 %. Last Weight  Most recent update: 05/16/2019  5:55 AM   Weight   82.9 kg (182 lb 12.2 oz)            CONSTITUTIONAL: Chronically ill-appearing male in no acute distress. EYES: Sclera non-icteric.    NECK: Trachea is midline, and there is no jugular venous distension.  LYMPH NODES:  Lymph nodes in the neck are not enlarged. RESPIRATORY:  Lungs are coarse with scattered rhonchi, and breath sounds are equal bilaterally. Normal respiratory effort without pathologic use of accessory muscles. CARDIOVASCULAR: Heart is regular in rate and rhythm. GI: The abdomen is  soft, nontender, and nondistended.    SKIN: Skin turgor of the right flank and right buttock has pitting edema.  Slightly increased pink hue which has been outlined with a marker.  No pathologic skin lesions appreciated.  There is no evidence of crepitus, no remarkable dermal discoloration aside from the pink hue.  No focused area of fluctuance. PSYCH: Consistent with alcohol withdrawal  Data Reviewed I have personally reviewed what is currently available of the patient's imaging, recent labs and medical records.   CBC:  Lab Results  Component Value Date   WBC 13.3 (H) 05/14/2019   RBC 3.61 (L) 05/14/2019     Assessment    Changes of the right flank though clearly not vague and well localized are of much lower significance when contrasted with the additional findings on the chest CT. Patient Active Problem List   Diagnosis Date Noted  . Community acquired pneumonia 01/20/2019  . Gastroenteritis 01/20/2019  . Blood in stool   . Change in bowel habits   . Constipation   . Polyp of sigmoid colon   . Depression 02/26/2016  . COPD (chronic obstructive pulmonary disease) ([place]) 02/03/2016  . Suprapubic catheter ([place]) 12/17/2015  . Urethral stricture 12/17/2015  . Urinary retention 10/27/2015  . Tobacco use disorder 08/27/2015  . Malnutrition of moderate degree 05/09/2015  . Sepsis ([place]) 05/06/2015  . UTI (lower urinary tract infection) 01/17/2015  . Hypoglycemia 01/17/2015  .  Gastroesophageal reflux disease 10/28/2014  . Bright red rectal bleeding 10/26/2014  . Chronic constipation 10/26/2014  . Hydronephrosis of left kidney 01/17/2014  . Neurogenic bladder  06/21/2013  . Urolithiasis 03/15/2013  . Nephrolithiasis 03/04/2013  . Metabolic acidosis 79/39/0300  . Acute renal failure ([place]) 11/03/2012  . Hyperkalemia 11/03/2012  . Pyelonephritis 11/03/2012  . Schizophrenia ([place]) 11/03/2012  . Hypothyroidism 11/03/2012  . Severe sepsis ([place]) 11/03/2012  . Protein-calorie malnutrition, severe ([place]) 11/03/2012  . Encephalopathy acute 11/03/2012  . Chronic kidney disease, stage IV (severe) ([place]) 07/02/2012  . 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 Kidney stone 04/13/2012    Plan    If myositis is concerned there may be lab markers that are consistent with this.  I am unable to appreciate a specific or localized potential abscess cavity.  And I do not appreciate any findings on CT scan suggesting a necrotizing process. Would continue with antibiotics as per ID recommendations, consider repeat CT scanning should septic picture persist despite treatment of the multifocal nature of his infection.  I find that it would be far more harmful than beneficial to proceed with any type of invasive effort to identify the process going on on the right flank.  We will gladly follow with you.     Ronny Bacon M.D., FACS 05/16/2019, 9:03 AM

## 2019-05-16 NOTE — Progress Notes (Signed)
Patient remains agitated and requiring a sitter to prevent self harm. He is confused and hallucinating.

## 2019-05-16 NOTE — Progress Notes (Deleted)
Found patient agitated with ventilator tubing pulled apart. Coughing with copious amounts of sputum noted on gown. Calmed down quickly and tubing re-assembled. Talked with sister about continuing care. She stated that patient had not been eating for a year. Patient has been in and out of hospital this passed year as well. Sister stated she would discuss care with family and decide how to progress with patients care. ie Code status.

## 2019-05-17 DIAGNOSIS — Z1621 Resistance to vancomycin: Secondary | ICD-10-CM

## 2019-05-17 DIAGNOSIS — R079 Chest pain, unspecified: Secondary | ICD-10-CM

## 2019-05-17 DIAGNOSIS — I4891 Unspecified atrial fibrillation: Secondary | ICD-10-CM

## 2019-05-17 DIAGNOSIS — R509 Fever, unspecified: Secondary | ICD-10-CM

## 2019-05-17 DIAGNOSIS — B952 Enterococcus as the cause of diseases classified elsewhere: Secondary | ICD-10-CM

## 2019-05-17 DIAGNOSIS — N390 Urinary tract infection, site not specified: Secondary | ICD-10-CM

## 2019-05-17 DIAGNOSIS — Z960 Presence of urogenital implants: Secondary | ICD-10-CM

## 2019-05-17 LAB — CBC WITH DIFFERENTIAL/PLATELET
Abs Immature Granulocytes: 1.8 10*3/uL — ABNORMAL HIGH (ref 0.00–0.07)
Basophils Absolute: 0.1 10*3/uL (ref 0.0–0.1)
Basophils Relative: 0 %
Eosinophils Absolute: 0.2 10*3/uL (ref 0.0–0.5)
Eosinophils Relative: 2 %
HCT: 29.4 % — ABNORMAL LOW (ref 39.0–52.0)
Hemoglobin: 9.4 g/dL — ABNORMAL LOW (ref 13.0–17.0)
Immature Granulocytes: 12 %
Lymphocytes Relative: 9 %
Lymphs Abs: 1.3 10*3/uL (ref 0.7–4.0)
MCH: 28.8 pg (ref 26.0–34.0)
MCHC: 32 g/dL (ref 30.0–36.0)
MCV: 90.2 fL (ref 80.0–100.0)
Monocytes Absolute: 0.8 10*3/uL (ref 0.1–1.0)
Monocytes Relative: 6 %
Neutro Abs: 10.6 10*3/uL — ABNORMAL HIGH (ref 1.7–7.7)
Neutrophils Relative %: 71 %
Platelets: 148 10*3/uL — ABNORMAL LOW (ref 150–400)
RBC: 3.26 MIL/uL — ABNORMAL LOW (ref 4.22–5.81)
RDW: 14.5 % (ref 11.5–15.5)
Smear Review: NORMAL
WBC: 14.8 10*3/uL — ABNORMAL HIGH (ref 4.0–10.5)
nRBC: 0 % (ref 0.0–0.2)

## 2019-05-17 LAB — BASIC METABOLIC PANEL
Anion gap: 10 (ref 5–15)
BUN: 49 mg/dL — ABNORMAL HIGH (ref 8–23)
CO2: 25 mmol/L (ref 22–32)
Calcium: 7.6 mg/dL — ABNORMAL LOW (ref 8.9–10.3)
Chloride: 111 mmol/L (ref 98–111)
Creatinine, Ser: 2.67 mg/dL — ABNORMAL HIGH (ref 0.61–1.24)
GFR calc Af Amer: 28 mL/min — ABNORMAL LOW (ref >60)
GFR calc non Af Amer: 24 mL/min — ABNORMAL LOW (ref >60)
Glucose, Bld: 115 mg/dL — ABNORMAL HIGH (ref 70–99)
Potassium: 3.1 mmol/L — ABNORMAL LOW (ref 3.5–5.1)
Sodium: 146 mmol/L — ABNORMAL HIGH (ref 135–145)

## 2019-05-17 LAB — POTASSIUM: Potassium: 3.4 mmol/L — ABNORMAL LOW (ref 3.5–5.1)

## 2019-05-17 LAB — GLUCOSE, CAPILLARY
Glucose-Capillary: 103 mg/dL — ABNORMAL HIGH (ref 70–99)
Glucose-Capillary: 92 mg/dL (ref 70–99)
Glucose-Capillary: 98 mg/dL (ref 70–99)
Glucose-Capillary: 77 mg/dL (ref 70–99)

## 2019-05-17 LAB — MAGNESIUM
Magnesium: 2.1 mg/dL (ref 1.7–2.4)
Magnesium: 2.1 mg/dL (ref 1.7–2.4)

## 2019-05-17 LAB — PHOSPHORUS: Phosphorus: 3.7 mg/dL (ref 2.5–4.6)

## 2019-05-17 MED ORDER — POTASSIUM CHLORIDE 2 MEQ/ML IV SOLN
INTRAVENOUS | Status: DC
  Filled 2019-05-17 (×6): qty 1000

## 2019-05-17 MED ORDER — AMIODARONE HCL IN DEXTROSE 360-4.14 MG/200ML-% IV SOLN
30.0000 mg/h | INTRAVENOUS | Status: DC
  Filled 2019-05-17 (×2): qty 200

## 2019-05-17 MED ORDER — AMIODARONE HCL IN DEXTROSE 360-4.14 MG/200ML-% IV SOLN
60.0000 mg/h | INTRAVENOUS | Status: AC
  Administered 2019-05-17 (×2): 60 mg/h via INTRAVENOUS
  Filled 2019-05-17 (×2): qty 200

## 2019-05-17 MED ORDER — AMIODARONE LOAD VIA INFUSION
150.0000 mg | Freq: Once | INTRAVENOUS | Status: AC
  Administered 2019-05-17: 150 mg via INTRAVENOUS
  Filled 2019-05-17: qty 83.34

## 2019-05-17 MED ORDER — SODIUM CHLORIDE 0.9 % IV SOLN
700.0000 mg | Freq: Every day | INTRAVENOUS | Status: DC
  Administered 2019-05-17 – 2019-05-19 (×2): 700 mg via INTRAVENOUS
  Filled 2019-05-17 (×3): qty 14

## 2019-05-17 MED ORDER — IPRATROPIUM-ALBUTEROL 0.5-2.5 (3) MG/3ML IN SOLN
3.0000 mL | Freq: Three times a day (TID) | RESPIRATORY_TRACT | Status: DC
  Administered 2019-05-17 – 2019-05-24 (×21): 3 mL via RESPIRATORY_TRACT
  Filled 2019-05-17 (×24): qty 3

## 2019-05-17 NOTE — Progress Notes (Signed)
[facility] SURGICAL PROGRESS NOTE (cpt [phone])  Hospital Day(s): 6.   Post op day(s): 6 Days Post-Op.   Interval History: Patient seen and examined, off Precedex, more alert and communicative this morning. He primarily only states "that he wants some water." difficult to assess if having pain in his right flank. Fever to 100.9 overnight. Slight increase in leukocytosis to 14.8.  Review of Systems:  Limited secondary to mental status  Vital signs in last 24 hours: [min-max] current  Temp:  [98.4 F (36.9 C)-100.9 F (38.3 C)] 99.2 F (37.3 C) (12/15 1200) Pulse Rate:  [85-126] 126 (12/15 1200) Resp:  [22-31] 29 (12/15 1200) BP: (88-124)/(50-72) 124/68 (12/15 1200) SpO2:  [87 %-99 %] 98 % (12/15 1200) Weight:  [84.4 kg] 84.4 kg (12/15 0500)     Height: 6' 0.01" (182.9 cm) Weight: 84.4 kg BMI (Calculated): 25.23   Intake/Output last 2 shifts:  12/14 0701 - 12/15 0700 In: 3076.9 [I.V.:2692.1; IV Piggyback:384.9] Out: 7681 [Urine:3275]   Physical Exam:  Constitutional: alert, cooperative and no distress  HENT: normocephalic without obvious abnormality  Respiratory: breathing non-labored at rest  Cardiovascular: tachycardic and sinus rhythm  Integumentary: Blanching erythema to the right lateral flank/back, there is appreciable pitting edema, no gross fluctuance or crepitus, this does not appear to tender to the patient.    Labs:  CBC Latest Ref Rng & Units 05/17/2019 05/14/2019 05/13/2019  WBC 4.0 - 10.5 K/uL 14.8(H) 13.3(H) 10.4  Hemoglobin 13.0 - 17.0 g/dL 9.4(L) 10.3(L) 9.9(L)  Hematocrit 39.0 - 52.0 % 29.4(L) 32.5(L) 30.1(L)  Platelets 150 - 400 K/uL 148(L) 107(L) 73(L)   CMP Latest Ref Rng & Units 05/17/2019 05/15/2019 05/15/2019  Glucose 70 - 99 mg/dL 115(H) - 101(H)  BUN 8 - 23 mg/dL 49(H) - 53(H)  Creatinine 0.61 - 1.24 mg/dL 2.67(H) - 2.26(H)  Sodium 135 - 145 mmol/L 146(H) 151(H) 151(H)  Potassium 3.5 - 5.1 mmol/L 3.1(L) - 4.0  Chloride 98 - 111  mmol/L 111 - 117(H)  CO2 22 - 32 mmol/L 25 - 26  Calcium 8.9 - 10.3 mg/dL 7.6(L) - 8.4(L)  Total Protein 6.5 - 8.1 g/dL - - -  Total Bilirubin 0.3 - 1.2 mg/dL - - -  Alkaline Phos 38 - 126 U/L - - -  AST 15 - 41 U/L - - -  ALT 0 - 44 U/L - - -     Imaging studies: No new pertinent imaging studies   Assessment/Plan: (ICD-10's: A41.9) 63 y.o. male with erythema and swelling to the right back/flank, which does not appear to have worsened compared to yesterday, unclear if this represents myositis vs cellulitis, no gross fluctuance or clear abscess and no crepitus to suggest necrotizing infection at this time, complicated by multiple comorbid conditions   - Continue IV Abx per ID for now  - No emergent surgical intervention currently; we will closely follow   - May consider repeat imaging if clinical picture/sepsis worsens   - further management per primary and consulting services    All of the above findings and recommendations were discussed with the medical team.  -- Edison Simon, PA-C [facility] 05/17/2019, 1:36 PM [phone] M-F: 7am - 4pm

## 2019-05-17 NOTE — Progress Notes (Signed)
1230 Patient had been in low S. Tac all day. Progressed into A.Fib/A.flutter. Dr. Mortimer Fries informed. Started on Amiodorone per protocol. Resumed his S.Tac in the low 100s.

## 2019-05-17 NOTE — Progress Notes (Signed)
ID  Pt has a complicated urological history . In Aug he was admitted with chills, fever and found to have left nephrolithiasis, obstructing left renal pelvic calculus, AKI . He underwent cystoscopy, dilatation of anterior urethral stricturand left ureteral stent placement by Dr.Stoioff on 01/21/19. Blood culture was neg, and urine culture was mixed species. After discharge from [facility] he was admitted to [facility] on 02/10/19 and was there until 03/02/19 During that hospitalization he was found to have pyelonephritis, nephrolithiasis, retroperitoneal abscess. Urinculturgrew MDR e.coli, Aspiration of abscess done by VIR on 02/17/19 and drain placement.The culture from the fluid was enterococcus fecalis. The drain  was removed on 9/26  ID was consulted and they recommended imipenem which he continued until Nov 14th [facility] urological proceduron 03/28/19.Cystoscopy, left ureteral stent removal, left retrogradpyelogram. 2. Left percutaneous nephrolithotomy stonburden greater than 2 cm. 3. Left renal tract access and dilation with supervision and interpretation. subcostal, infra-12th rib 4. Left antegrade nephrostogram through new access. 5. Left diagnostic renal ultrasound, with real timimaging documentation, limited study.  6. Left antegradureteral stent placement, 6-French x24 cm.  7. Supervision and interpretation of pyelograms and ureteral stent placement. 8. Left retrograde ureteroscopy with basket stonextraction and stonmanipulation.  9. Left percutaneous nephrostomy placement  On 11/3 lithotripsy and stent exchange and on  11/11 left ureteral stent removal. An order for removal of the power line was placed by Dr.Friendlander but the line was never removed  11/3 UC was enterococcus faecium but does not look like it was treated- The stone was also cultured and it was the same  Admitted to [facility] on 05/10/09 with tachypnea, left flank pain and subjective fevers with nausea and vomiting. CT abdomen showed   Mild prominencof the left ureter present above the level of a partially obstructing 5 mm stone at pelvic inlet.a urine culture was sent which later came back negative He was started on vanco and meropenem. He was seen by urologist and underwent cystoscopy, left ureteral stent placement on 05/11/19 . He has a bulbar stricture  Blood culture positive for MRSA . He is now on ceftaroline The central line was removed and repeat blood cultures have been negative  BP 124/68   Pulse (!) 126   Temp 99.2 F (37.3 C)   Resp (!) 29   Ht 6' 0.01" (1.829 m)   Wt 84.4 kg   SpO2 98%   BMI 25.23 kg/m     Pt has been having intermittent fever since 05/13/19 and a urine culture sent on 12/14 showed enterococcus faecium     Pt awake and alert Not confused Went into afib today and started on amiodarone drip Chest b/l air entry HS tachycardia abd soft Foley catheter- dark urine Left IJ   CBC Latest Ref Rng & Units 05/17/2019 05/14/2019 05/13/2019  WBC 4.0 - 10.5 K/uL 14.8(H) 13.3(H) 10.4  Hemoglobin 13.0 - 17.0 g/dL 9.4(L) 10.3(L) 9.9(L)  Hematocrit 39.0 - 52.0 % 29.4(L) 32.5(L) 30.1(L)  Platelets 150 - 400 K/uL 148(L) 107(L) 73(L)      CMP Latest Ref Rng & Units 05/17/2019 05/15/2019 05/15/2019  Glucose 70 - 99 mg/dL 115(H) - 101(H)  BUN 8 - 23 mg/dL 49(H) - 53(H)  Creatinine 0.61 - 1.24 mg/dL 2.67(H) - 2.26(H)  Sodium 135 - 145 mmol/L 146(H) 151(H) 151(H)  Potassium 3.5 - 5.1 mmol/L 3.1(L) - 4.0  Chloride 98 - 111 mmol/L 111 - 117(H)  CO2 22 - 32 mmol/L 25 - 26  Calcium 8.9 - 10.3 mg/dL 7.6(L) - 8.4(L)  Total Protein 6.5 - 8.1 g/dL - - -  Total Bilirubin 0.3 - 1.2 mg/dL - - -  Alkaline Phos 38 - 126 U/L - - -  AST 15 - 41 U/L - - -  ALT 0 - 44 U/L - - -     Microbiology :MRSA 05/11/19 BC 12/12 and 12/13 NG   Impression/Recommendation  MRSA bacteremia from a previous central line which he had on admission and that has been removed He has left IJ placed on 12/10 when he was  bacteremic. This  line will have to be removed now He needs TEE Repeat blood culture neg On Ceftaroline   Intermittent fever since 12/11- urine culture enterococcus faecium.( VRE)  This was present on 04/05/19 as well in the urine and stone. Was not treated before- the urine culture on admission was negative but after the ureteral stent on 12/10 it looks like the ureteral stone got tickled and  the enterococcus is spilling in the urine This is not a CAUTI Will add daptomycin for VRE coverage   AKI on CKD  Left ureteral  calculus causing partial obstruction and now has a stent  H/o left hydronephrosis, had nephrostomy, ureteral stent, retroperitoneal abscess  H/o ESBL e.coli pyelonephritis and H/o enterococcus fecalis retroperitoneal abscess- S/p drain placement at [facility] and received nearly 2 months of IV imipenem  Acute hypoxic resp failure- remains intubated after procedure ?  H/O Rt nephrectomy  Discussed the management with the nurse and pharmacist

## 2019-05-17 NOTE — Consult Note (Signed)
Pharmacy Antibiotic Note  Alan Small is a 63 y.o. male admitted on 05/11/2019 with MRSA bacteremia with suspected source from central line that was present on admission (placed 9/23 at [facility]?) which has since been removed. Patient with respiratory failure on admission and was intubated 12/9-12/11 and is now on 3L [place]. History of solitary left kidney and 12/9 CT significant for partially obstructing 5 mm left ureteral calculus for which he underwent urgent ureteral stent placement. TEE planned. 12/13 CT chest with cavitary lesions in left lung c/f septic emboli and abnormal expansion of the muscles of the lateral aspect of the right abdominal wall c/f intramuscular abscess/infectious myositis.    Patient was admitted at [facility] 9/10-9/30 for pyelonephritis, nephrolithiasis and retroperitoneal hematoma/abscess. Urine culture from that admission with MDR E coli. There was concern for infected stone and appears he was on imipenem/cilastatin 9/21 thru 11/14. He is s/p left PCNL followed by left URS/LL/stent of residual fragments and stent removal at [facility]. Renal calculi culture from 11/3 with <1+ Enterococcus faecium (VRE).   Pharmacy was consulted for daptomycin dosing due to E faecium in urine and new fevers. WBC trending up.  Patient is also on ceftaroline 400 mg q12h for MRSA bacteremia- day 4 from first negative blood culture  Plan: Daptomycin 700 mg (~8 mg/kg) q24h  Creatinine trending up today. CrCl slightly over 30 mL/min. If creatinine worsens tomorrow may have to extend dosing interval to q48h. CK ordered for tomorrow morning and then weekly while on daptomycin. Patient is not on a statin.   Height: 6' 0.01" (182.9 cm) Weight: 186 lb 1.1 oz (84.4 kg) IBW/kg (Calculated) : 77.62  Temp (24hrs), Avg:99.8 F (37.7 C), Min:98.5 F (36.9 C), Max:100.9 F (38.3 C)  Recent Labs  Lab 05/11/19 1047 05/11/19 1246 05/11/19 1644 05/12/19 0521 05/13/19 0637 05/14/19 0434 05/15/19 0516  05/17/19 0512  WBC 13.9*  --  13.0* 8.9 10.4 13.3*  --  14.8*  CREATININE 3.47*  --  2.99* 2.99* 2.98* 2.60* 2.26* 2.67*  LATICACIDVEN 3.6* 1.7 1.2  --   --   --   --   --     Estimated Creatinine Clearance: 30.7 mL/min (A) (by C-G formula based on SCr of 2.67 mg/dL (H)).    Antimicrobials this admission: meropenem 12/9 >> 12/10 vancomycin 12/10 x 1 Ceftaroline 12/10 >> Daptomycin 12/15 >>  Microbiology results: 12/14 Ucx: >100k colonies/mL E faecium 12/13 Bcx: Ngx2d 12/12 Bcx: Ngx3d 12/9 BCx: 4/4 MRSA 12/9 UCx: no growth 12/9 SARS CoV-2: negative  12/9 MRSA PCR: positive  Thank you for allowing pharmacy to be a part of this patient's care.  Bradley Resident 05/17/2019 5:02 PM

## 2019-05-17 NOTE — Progress Notes (Signed)
OT Cancellation Note  Patient Details Name: Alan Small MRN: [id number] DOB: [birth year]   Cancelled Treatment:    Reason Eval/Treat Not Completed: Patient not medically ready. Consult received, chart reviewed. Spoke with RN by phone. Per RN, pt inappropriate for therapy this date. Needs additional time off Precedex prior to initiation of therapy. Will continue to follow and intervene for OT evaluation when medically appropriate.   Jeni Salles, MPH, MS, OTR/L ascom [phone] 05/17/19, 11:54 AM

## 2019-05-17 NOTE — Consult Note (Signed)
PHARMACY CONSULT NOTE - FOLLOW UP  Pharmacy Consult for Electrolyte Monitoring and Replacement   Recent Labs: Potassium (mmol/L)  Date Value  05/17/2019 3.1 (L)  09/19/2014 3.7   Magnesium (mg/dL)  Date Value  05/17/2019 2.1  12/08/2012 1.8   Calcium (mg/dL)  Date Value  05/17/2019 7.6 (L)   Calcium, Total (mg/dL)  Date Value  09/19/2014 8.9   Albumin (g/dL)  Date Value  05/14/2019 2.0 (L)  09/19/2014 3.5   Phosphorus (mg/dL)  Date Value  05/17/2019 3.7   Sodium (mmol/L)  Date Value  05/17/2019 146 (H)  09/19/2014 29   63 year old male admitted with MRSA bacteremia with a past medical history of asthma, COPD, depression, gastric ulcer, GERD, hypothyroid, MI, stroke, schizophrenia, and seizure.  He has a complicated urological history as well, as he only possesses one kidney with emergent stent placement this admission.  Currently extubated, and also on an amiodarone drip for atrial fibrillation.    Goal of Therapy:  All electrolytes wnl  Plan:  D5 + 40 mEq KCl continuous infusion at 100 mL/h Electrolytes with AM labs  Gerald Dexter ,PharmD Clinical Pharmacist 05/17/2019 4:08 PM

## 2019-05-17 NOTE — Progress Notes (Signed)
Progress Note  Patient Name: Alan Small Date of Encounter: 05/17/2019  Primary Cardiologist: New  Subjective   Patient noted some left axillary and chest pain overnight but is unable to provide further details.  No shortness of breath, palpitations, or lightheadedness.  Precedex was stopped yesterday.  Sitter remains at the bedside.  Inpatient Medications    Scheduled Meds: . chlorhexidine  15 mL Mouth Rinse BID  . Chlorhexidine Gluconate Cloth  6 each Topical Daily  . heparin  5,000 Units Subcutaneous Q8H  . ipratropium-albuterol  3 mL Nebulization TID  . mouth rinse  15 mL Mouth Rinse q12n4p  . multivitamin  15 mL Oral Daily  . mupirocin ointment  1 application Nasal BID  . nicotine  21 mg Transdermal Daily  . pantoprazole (PROTONIX) IV  40 mg Intravenous QHS  . PHENObarbital  65 mg Intravenous BID   Followed by  . [START ON 05/18/2019] PHENObarbital  30 mg Intravenous BID  . sodium chloride flush  10-40 mL Intracatheter Q12H   Continuous Infusions: . sodium chloride 10 mL/hr at 05/15/19 1800  . amiodarone 60 mg/hr (05/17/19 1237)   Followed by  . amiodarone    . ceFTAROline (TEFLARO) IV 400 mg (05/17/19 1040)  . dextrose 5 % with kcl 100 mL/hr at 05/17/19 1038   PRN Meds: sodium chloride, acetaminophen, albuterol, LORazepam, midazolam, midazolam, ondansetron (ZOFRAN) IV, sodium chloride flush   Vital Signs    Vitals:   05/17/19 0900 05/17/19 1000 05/17/19 1100 05/17/19 1200  BP: 105/61 (!) 97/59 (!) 95/59 124/68  Pulse: (!) 122 (!) 118 (!) 116 (!) 126  Resp: (!) 25 (!) 25 (!) 27 (!) 29  Temp:    99.2 F (37.3 C)  TempSrc:      SpO2: (!) 87% 99% 97% 98%  Weight:      Height:        Intake/Output Summary (Last 24 hours) at 05/17/2019 1314 Last data filed at 05/17/2019 0646 Gross per 24 hour  Intake 3076.94 ml  Output 3275 ml  Net -198.06 ml   Last 3 Weights 05/17/2019 05/16/2019 05/14/2019  Weight (lbs) 186 lb 1.1 oz 182 lb 12.2 oz 196 lb 6.9 oz    Weight (kg) 84.4 kg 82.9 kg 89.1 kg      Telemetry    Sinus tachycardia with PACs and PVCs - Personally Reviewed  ECG    05/11/2019: Sinus tachycardia with prolonged QT (personally reviewed).  Physical Exam   GEN: No acute distress.   Neck: No JVD Cardiac:  Tachycardic but regular, no murmurs, rubs, or gallops.  Respiratory:  Mildly diminished breath sounds at both lung bases. GI: Soft, nontender, non-distended  MS: No edema; No deformity. Neuro:   Awake and follows commands.  Alert and oriented x2.  No focal deficit. Psych: Normal affect   Labs    High Sensitivity Troponin:   Recent Labs  Lab 05/12/19 1000 05/13/19 0637  TROPONINIHS 17 12      Chemistry Recent Labs  Lab 05/11/19 1047 05/11/19 1644 05/14/19 0434 05/15/19 0516 05/15/19 1816 05/17/19 0512  NA 133*   < > 147* 151* 151* 146*  K 3.9   < > 3.5 4.0  --  3.1*  CL 103   < > 112* 117*  --  111  CO2 15*   < > 27 26  --  25  GLUCOSE 152*   < > 108* 101*  --  115*  BUN 47*   < > 56*  53*  --  49*  CREATININE 3.47*  --  2.60* 2.26*  --  2.67*  CALCIUM 8.3*   < > 8.2* 8.4*  --  7.6*  PROT 6.2*  --   --   --   --   --   ALBUMIN 2.6*  --  2.0*  --   --   --   AST 51*  --   --   --   --   --   ALT 43  --   --   --   --   --   ALKPHOS 80  --   --   --   --   --   BILITOT 0.8  --   --   --   --   --   GFRNONAA 18*  --  25* 30*  --  24*  GFRAA 20*  --  29* 34*  --  28*  ANIONGAP 15   < > 8 8  --  10   < > = values in this interval not displayed.     Hematology Recent Labs  Lab 05/13/19 0637 05/14/19 0434 05/17/19 0512  WBC 10.4 13.3* 14.8*  RBC 3.35* 3.61* 3.26*  HGB 9.9* 10.3* 9.4*  HCT 30.1* 32.5* 29.4*  MCV 89.9 90.0 90.2  MCH 29.6 28.5 28.8  MCHC 32.9 31.7 32.0  RDW 14.3 14.2 14.5  PLT 73* 107* 148*    BNPNo results for input(s): BNP, PROBNP in the last 168 hours.   DDimer No results for input(s): DDIMER in the last 168 hours.   Radiology    CT ABDOMEN PELVIS WO CONTRAST  Result  Date: 05/15/2019 CLINICAL DATA:  Right-sided edema and warmth and redness. Urinary tract infection. Recent placement of left ureteral stent. EXAM: CT CHEST, ABDOMEN AND PELVIS WITHOUT CONTRAST TECHNIQUE: Multidetector CT imaging of the chest, abdomen and pelvis was performed following the standard protocol without IV contrast. COMPARISON:  CT scans dated 05/11/2019 and 08/20/2018 FINDINGS: CT CHEST FINDINGS Cardiovascular: Slight aortic atherosclerosis. Heart size is normal. No pericardial effusion. However, there is extensive acute soft tissue stranding in the left pericardial fat pad, likely infectious. Mediastinum/Nodes: New extensive soft tissue stranding pericardial fat pad worrisome for infection. No mediastinal adenopathy. Thyroid gland is atrophic. Tiny hiatal hernia. Lungs/Pleura: There are multiple cavitary lesions in the left lung, likely representing septic emboli. There is an irregular 2.2 cm nodule in the left upper lobe which could represent a mass but is probably a septic embolus which is not yet cavitated. There is a 5 mm lesion in the lateral aspect of the left upper lobe which has not yet cavitated. Hazy infiltrate at the right lung apex with a small nodule posterior medially at the apex. Consolidation at the inferior aspect of the right hilum. There are new small bilateral pleural effusions, left greater than right with atelectasis at both lung bases. Musculoskeletal: There is slight soft tissue stranding around the inferior aspect of the right latissimus dorsi muscle soft tissue stranding in the subcutaneous fat of both flanks, right greater than left. No acute bone abnormality. CT ABDOMEN PELVIS FINDINGS Hepatobiliary: 9 cm cyst on the tip of the left lobe of the liver in the left upper quadrant 2 cm cyst in the caudate lobe of the liver, unchanged. There are 216 mm cysts in the inferior aspect of the right lobe of the liver also unchanged. Biliary tree is normal. Pancreas: Unremarkable. No  pancreatic ductal dilatation or surrounding inflammatory changes. Spleen:  Normal in size without focal abnormality. Adrenals/Urinary Tract: Adrenal glands are normal. Left ureteral stent in place. Stone in the mid left kidney, unchanged. No hydronephrosis. Previous right nephrectomy. Foley catheter in the bladder. Stomach/Bowel: Stomach is within normal limits. Appendix has been removed. No evidence of bowel wall thickening, distention, or inflammatory changes. Vascular/Lymphatic: Aortic atherosclerosis. No enlarged abdominal or pelvic lymph nodes. Reproductive: Numerous benign-appearing calcifications in the prostate gland. Other: No abdominal wall hernia or abnormality. No abdominopelvic ascites. Musculoskeletal: There is abnormal expansion of the muscles of the lateral aspect of the right abdominal wall with lucency in that area, worrisome for an intramuscular abscess. There is soft tissue stranding in the adjacent subcutaneous fat. Soft tissue stranding seen around the distal right latissimus Dorsey muscle and that muscle is slightly hypertrophied as compared to the left and may also be involved infection. There is subcutaneous edema at the lateral aspect of both hips, right more than left. No significant bone abnormality. IMPRESSION: 1. Multiple cavitary lesions in the left lung consistent with septic emboli. Abnormal soft tissue stranding in the prominent left pericardial fat pad also worrisome for infection. 2. Abnormal expansion of the muscles of the lateral aspect of the right abdominal wall with lucency in that area, worrisome for an intramuscular abscess/infectious myositis. 3. Soft tissue stranding around the inferior aspect of the right latissimus dorsi muscle and in the subcutaneous fat of both flanks, right greater than left, consistent with cellulitis. 4. New small bilateral pleural effusions, left greater than right. 5. Aortic Atherosclerosis (ICD10-I70.0). 6. New left ureteral stent appears in good  position. No acute abnormalities of the left kidney. Electronically Signed   By: Lorriane Shire M.D.   On: 05/15/2019 16:02   CT CHEST WO CONTRAST  Result Date: 05/15/2019 CLINICAL DATA:  Right-sided edema and warmth and redness. Urinary tract infection. Recent placement of left ureteral stent. EXAM: CT CHEST, ABDOMEN AND PELVIS WITHOUT CONTRAST TECHNIQUE: Multidetector CT imaging of the chest, abdomen and pelvis was performed following the standard protocol without IV contrast. COMPARISON:  CT scans dated 05/11/2019 and 08/20/2018 FINDINGS: CT CHEST FINDINGS Cardiovascular: Slight aortic atherosclerosis. Heart size is normal. No pericardial effusion. However, there is extensive acute soft tissue stranding in the left pericardial fat pad, likely infectious. Mediastinum/Nodes: New extensive soft tissue stranding pericardial fat pad worrisome for infection. No mediastinal adenopathy. Thyroid gland is atrophic. Tiny hiatal hernia. Lungs/Pleura: There are multiple cavitary lesions in the left lung, likely representing septic emboli. There is an irregular 2.2 cm nodule in the left upper lobe which could represent a mass but is probably a septic embolus which is not yet cavitated. There is a 5 mm lesion in the lateral aspect of the left upper lobe which has not yet cavitated. Hazy infiltrate at the right lung apex with a small nodule posterior medially at the apex. Consolidation at the inferior aspect of the right hilum. There are new small bilateral pleural effusions, left greater than right with atelectasis at both lung bases. Musculoskeletal: There is slight soft tissue stranding around the inferior aspect of the right latissimus dorsi muscle soft tissue stranding in the subcutaneous fat of both flanks, right greater than left. No acute bone abnormality. CT ABDOMEN PELVIS FINDINGS Hepatobiliary: 9 cm cyst on the tip of the left lobe of the liver in the left upper quadrant 2 cm cyst in the caudate lobe of the  liver, unchanged. There are 216 mm cysts in the inferior aspect of the right lobe of the liver  also unchanged. Biliary tree is normal. Pancreas: Unremarkable. No pancreatic ductal dilatation or surrounding inflammatory changes. Spleen: Normal in size without focal abnormality. Adrenals/Urinary Tract: Adrenal glands are normal. Left ureteral stent in place. Stone in the mid left kidney, unchanged. No hydronephrosis. Previous right nephrectomy. Foley catheter in the bladder. Stomach/Bowel: Stomach is within normal limits. Appendix has been removed. No evidence of bowel wall thickening, distention, or inflammatory changes. Vascular/Lymphatic: Aortic atherosclerosis. No enlarged abdominal or pelvic lymph nodes. Reproductive: Numerous benign-appearing calcifications in the prostate gland. Other: No abdominal wall hernia or abnormality. No abdominopelvic ascites. Musculoskeletal: There is abnormal expansion of the muscles of the lateral aspect of the right abdominal wall with lucency in that area, worrisome for an intramuscular abscess. There is soft tissue stranding in the adjacent subcutaneous fat. Soft tissue stranding seen around the distal right latissimus Dorsey muscle and that muscle is slightly hypertrophied as compared to the left and may also be involved infection. There is subcutaneous edema at the lateral aspect of both hips, right more than left. No significant bone abnormality. IMPRESSION: 1. Multiple cavitary lesions in the left lung consistent with septic emboli. Abnormal soft tissue stranding in the prominent left pericardial fat pad also worrisome for infection. 2. Abnormal expansion of the muscles of the lateral aspect of the right abdominal wall with lucency in that area, worrisome for an intramuscular abscess/infectious myositis. 3. Soft tissue stranding around the inferior aspect of the right latissimus dorsi muscle and in the subcutaneous fat of both flanks, right greater than left, consistent with  cellulitis. 4. New small bilateral pleural effusions, left greater than right. 5. Aortic Atherosclerosis (ICD10-I70.0). 6. New left ureteral stent appears in good position. No acute abnormalities of the left kidney. Electronically Signed   By: Lorriane Shire M.D.   On: 05/15/2019 16:02   DG Chest Port 1 View  Result Date: 05/15/2019 CLINICAL DATA:  Pulmonary disease, cough EXAM: PORTABLE CHEST 1 VIEW COMPARISON:  05/12/2019 FINDINGS: Interval endotracheal and esophagogastric extubation. Left neck vascular catheter, tip projecting over the lower SVC. Diffuse interstitial pulmonary opacity, similar to prior examination. Cardiomegaly. IMPRESSION: 1.  Interval endotracheal and esophagogastric extubation. 2. Diffuse interstitial pulmonary opacity, similar to prior examination, and consistent with edema or infection. No new airspace opacity. 3.  Cardiomegaly. Electronically Signed   By: Eddie Candle M.D.   On: 05/15/2019 15:03    Cardiac Studies   TTE (05/12/2019):  1. Left ventricular ejection fraction, by visual estimation, is 60 to 65%. The left ventricle has normal function. There is no left ventricular hypertrophy.  2. Left ventricular diastolic parameters are consistent with Grade I diastolic dysfunction (impaired relaxation).  3. The left ventricle has no regional wall motion abnormalities.  4. Global right ventricle has normal systolic function.The right ventricular size is normal. No increase in right ventricular wall thickness.  5. Left atrial size was normal.  6. TR signal is inadequate for assessing pulmonary artery systolic pressure.  7. No valve vegetation noted  Patient Profile     63 y.o. male with solitary left kidney complicated by partial obstructing ureteral calculus requiring emergency ureteral stent placement, MRSA bacteremia, and COPD, whom we have been asked to see due to bacteremia.  Assessment & Plan    MRSA bacteremia: To be central line placed in 02/2019.  TEE has been  recommended by infectious disease.  TEE is reasonable.  Patient is agreeable, though he has limited understanding of the procedure and why he is hospitalized.  He has also  been frequently agitated during this hospitalization.  I have reached out to Griffin (the patient's legal guardian) to obtain consent for the procedure.  Continued antimicrobial therapy per ID and critical care medicine.  Please keep the patient n.p.o. after midnight in case procedure can be performed tomorrow.  Chest pain: Difficult to elicit exact symptoms.  EKG last week without ischemia.  High-sensitivity troponins earlier in the admission unrevealing (17 and 12).  Defer ischemia work-up unless patient has recurrent symptoms or objective evidence of ischemia.  For questions or updates, please contact [place] Please consult [email or web address] for contact info under [facility].     Signed, Nelva Bush, MD  05/17/2019, 1:14 PM

## 2019-05-17 NOTE — Progress Notes (Signed)
PT Cancellation Note  Patient Details Name: Alan Small MRN: [id number] DOB: 19-Jan-1956   Cancelled Treatment:    Reason Eval/Treat Not Completed: Patient not medically ready OT spoke with RN by phone earlier. Per RN, pt inappropriate for therapy this date. Needs additional time off Precedex prior to initiation of therapy. Will continue to follow and intervene for OT evaluation when medically appropriate.   Kreg Shropshire, DPT 05/17/2019, 12:00 PM

## 2019-05-18 ENCOUNTER — Inpatient Hospital Stay: Payer: Self-pay

## 2019-05-18 ENCOUNTER — Encounter
Admission: EM | Disposition: A | Discharge status: Other Acute Inpatient Hospital | Payer: Self-pay | Source: Home / Self Care | Attending: Internal Medicine

## 2019-05-18 ENCOUNTER — Inpatient Hospital Stay: Payer: Medicare Other

## 2019-05-18 ENCOUNTER — Inpatient Hospital Stay (HOSPITAL_COMMUNITY)
Admit: 2019-05-18 | Discharge: 2019-05-18 | Disposition: A | Discharge status: Home / Self Care | Payer: Medicare Other | Attending: Internal Medicine | Admitting: Internal Medicine

## 2019-05-18 DIAGNOSIS — J80 Acute respiratory distress syndrome: Secondary | ICD-10-CM

## 2019-05-18 DIAGNOSIS — J449 Chronic obstructive pulmonary disease, unspecified: Secondary | ICD-10-CM

## 2019-05-18 DIAGNOSIS — R7881 Bacteremia: Secondary | ICD-10-CM

## 2019-05-18 DIAGNOSIS — J9602 Acute respiratory failure with hypercapnia: Secondary | ICD-10-CM

## 2019-05-18 DIAGNOSIS — I2690 Septic pulmonary embolism without acute cor pulmonale: Secondary | ICD-10-CM

## 2019-05-18 DIAGNOSIS — I499 Cardiac arrhythmia, unspecified: Secondary | ICD-10-CM

## 2019-05-18 DIAGNOSIS — I4892 Unspecified atrial flutter: Secondary | ICD-10-CM

## 2019-05-18 DIAGNOSIS — D72829 Elevated white blood cell count, unspecified: Secondary | ICD-10-CM

## 2019-05-18 DIAGNOSIS — B9562 Methicillin resistant Staphylococcus aureus infection as the cause of diseases classified elsewhere: Secondary | ICD-10-CM | POA: Diagnosis present

## 2019-05-18 DIAGNOSIS — I351 Nonrheumatic aortic (valve) insufficiency: Secondary | ICD-10-CM

## 2019-05-18 HISTORY — PX: TEE WITHOUT CARDIOVERSION: SHX5443

## 2019-05-18 LAB — BASIC METABOLIC PANEL
Anion gap: 11 (ref 5–15)
BUN: 47 mg/dL — ABNORMAL HIGH (ref 8–23)
CO2: 24 mmol/L (ref 22–32)
Calcium: 7.7 mg/dL — ABNORMAL LOW (ref 8.9–10.3)
Chloride: 111 mmol/L (ref 98–111)
Creatinine, Ser: 2.64 mg/dL — ABNORMAL HIGH (ref 0.61–1.24)
GFR calc Af Amer: 28 mL/min — ABNORMAL LOW (ref >60)
GFR calc non Af Amer: 24 mL/min — ABNORMAL LOW (ref >60)
Glucose, Bld: 126 mg/dL — ABNORMAL HIGH (ref 70–99)
Potassium: 3.4 mmol/L — ABNORMAL LOW (ref 3.5–5.1)
Sodium: 146 mmol/L — ABNORMAL HIGH (ref 135–145)

## 2019-05-18 LAB — GLUCOSE, CAPILLARY
Glucose-Capillary: 111 mg/dL — ABNORMAL HIGH (ref 70–99)
Glucose-Capillary: 115 mg/dL — ABNORMAL HIGH (ref 70–99)
Glucose-Capillary: 85 mg/dL (ref 70–99)
Glucose-Capillary: 91 mg/dL (ref 70–99)
Glucose-Capillary: 97 mg/dL (ref 70–99)

## 2019-05-18 LAB — CBC
HCT: 30.2 % — ABNORMAL LOW (ref 39.0–52.0)
Hemoglobin: 9.7 g/dL — ABNORMAL LOW (ref 13.0–17.0)
MCH: 28.9 pg (ref 26.0–34.0)
MCHC: 32.1 g/dL (ref 30.0–36.0)
MCV: 89.9 fL (ref 80.0–100.0)
Platelets: 185 10*3/uL (ref 150–400)
RBC: 3.36 MIL/uL — ABNORMAL LOW (ref 4.22–5.81)
RDW: 14.8 % (ref 11.5–15.5)
WBC: 17.3 10*3/uL — ABNORMAL HIGH (ref 4.0–10.5)
nRBC: 0 % (ref 0.0–0.2)

## 2019-05-18 LAB — CK: Total CK: 41 U/L — ABNORMAL LOW (ref 49–397)

## 2019-05-18 LAB — URINE CULTURE: Culture: >=100000 — AB

## 2019-05-18 LAB — PHOSPHORUS: Phosphorus: 4 mg/dL (ref 2.5–4.6)

## 2019-05-18 LAB — MAGNESIUM: Magnesium: 2 mg/dL (ref 1.7–2.4)

## 2019-05-18 SURGERY — ECHOCARDIOGRAM, TRANSESOPHAGEAL
Anesthesia: Moderate Sedation

## 2019-05-18 MED ORDER — LIDOCAINE VISCOUS HCL 2 % MT SOLN
OROMUCOSAL | Status: AC | PRN
  Administered 2019-05-18: 15 mL via OROMUCOSAL

## 2019-05-18 MED ORDER — FENTANYL CITRATE (PF) 100 MCG/2ML IJ SOLN
INTRAMUSCULAR | Status: AC
  Filled 2019-05-18: qty 2

## 2019-05-18 MED ORDER — METOPROLOL TARTRATE 25 MG PO TABS
25.0000 mg | ORAL_TABLET | Freq: Two times a day (BID) | ORAL | Status: DC
  Administered 2019-05-18 – 2019-05-24 (×13): 25 mg via ORAL
  Filled 2019-05-18 (×13): qty 1

## 2019-05-18 MED ORDER — POTASSIUM CHLORIDE 20 MEQ PO PACK
20.0000 meq | PACK | Freq: Once | ORAL | Status: AC
  Administered 2019-05-18: 20 meq via ORAL
  Filled 2019-05-18: qty 1

## 2019-05-18 MED ORDER — BUTAMBEN-TETRACAINE-BENZOCAINE 2-2-14 % EX AERO
INHALATION_SPRAY | CUTANEOUS | Status: AC
  Filled 2019-05-18: qty 5

## 2019-05-18 MED ORDER — EPINEPHRINE PF 1 MG/ML IJ SOLN
INTRAMUSCULAR | Status: AC
  Filled 2019-05-18: qty 1

## 2019-05-18 MED ORDER — DEXTROSE 5 % IV SOLN: INTRAVENOUS | Status: DC

## 2019-05-18 MED ORDER — LIDOCAINE VISCOUS HCL 2 % MT SOLN
OROMUCOSAL | Status: AC
  Filled 2019-05-18: qty 15

## 2019-05-18 MED ORDER — FENTANYL CITRATE (PF) 100 MCG/2ML IJ SOLN
INTRAMUSCULAR | Status: AC | PRN
  Administered 2019-05-18: 25 ug via INTRAVENOUS

## 2019-05-18 MED ORDER — MIDAZOLAM HCL 5 MG/5ML IJ SOLN
INTRAMUSCULAR | Status: AC | PRN
  Administered 2019-05-18: 1 mg via INTRAVENOUS

## 2019-05-18 MED ORDER — MIDAZOLAM HCL 5 MG/5ML IJ SOLN
INTRAMUSCULAR | Status: AC
  Filled 2019-05-18: qty 5

## 2019-05-18 MED ORDER — ENSURE ENLIVE PO LIQD
237.0000 mL | Freq: Three times a day (TID) | ORAL | Status: DC
  Administered 2019-05-18 – 2019-05-24 (×12): 237 mL via ORAL

## 2019-05-18 MED ORDER — SODIUM CHLORIDE FLUSH 0.9 % IV SOLN
INTRAVENOUS | Status: AC
  Filled 2019-05-18: qty 10

## 2019-05-18 NOTE — Progress Notes (Signed)
[facility]  Date: 05/18/19 Time: 9:49 AM  I was contacted by Lollie Sails of [facility] [phone]), who is able to provide consent for Alan Small (under guardianship with [facility]).  I discussed the rationale for TEE with moderate sedation as well as its risks and benefits.  She has provided verbal consent to proceed with the procedure.  Of note, Alan Small also agreed to proceed with the TEE when it was discussed with him yesterday.  Nelva Bush, MD [facility]

## 2019-05-18 NOTE — Evaluation (Signed)
Physical Therapy Evaluation Patient Details Name: Alan Small MRN: [id number] DOB: 01-Feb-1956 Today's Date: 05/18/2019   History of Present Illness  Per MD notes: Pt is a 63 year old current smoker with a solitary kidney (LEFT) and a PMH that includes: COPD, depression, GERD, hydronephrosis, hypothyroidism, insomnia, MI, paranoid schizophrenia, renal failure, seizures and CVA.  He is post placement of a ureteral stent for a partially obstructing ureteral calculus.  The patient was having symptoms of fatigue, shortness of breath and flank pain on presentation according to the records.  The patient was noted to be tachypneic and tachycardic and obviously appeared septic.  He was getting meropenem through a right IJ Port-A-Cath as an outpatient.  He has a history of ESBL in the past.  Pt intubated on vent during admission now extubated.  MD assessment also includes erythema and swelling to the right back/flank with possible cellulitis, severe sepsis, COPD exacerbation, UTI, and acute on chronic renal failure.    Clinical Impression  Pt on amiodarone IV for >24 hrs and ok to see per nursing.  Pt presented with deficits in strength, transfers, mobility, gait, balance, and activity tolerance.  Pt required min-mod A with bed mobility with cues for sequencing.  Multiple attempts to stand at EOB with bed at various heights with pt unable to clear the surface of the bed.  With min A pt was able to laterally scoot at the EOB with significant effort. Pt fatigued during the session but no other adverse symptoms noted.  Pt will benefit from PT services in a SNF setting upon discharge to safely address above deficits for decreased caregiver assistance and eventual return to PLOF.      Follow Up Recommendations SNF    Equipment Recommendations  Rolling walker with 5" wheels    Recommendations for Other Services       Precautions / Restrictions Precautions: h/o seizures  Precautions:  Fall Restrictions Weight Bearing Restrictions: No      Mobility  Bed Mobility Overal bed mobility: Needs Assistance Bed Mobility: Rolling;Sidelying to Sit;Sit to Sidelying Rolling: Min assist Sidelying to sit: Mod assist     Sit to sidelying: Mod assist General bed mobility comments: Log roll technique training with mod verbal and tactile cues for sequencing  Transfers Overall transfer level: Needs assistance Equipment used: Rolling walker (2 wheeled) Transfers: Sit to/from Stand;Lateral/Scoot Transfers Sit to Stand: +2 physical assistance;Min assist        Lateral/Scoot Transfers: Mod assist General transfer comment: Pt able to unweight from surface of bed during multiple sit to stand attempts but unable to clear the surface of the bed; pt able to laterally scoot with min A pulling on draw sheet  Ambulation/Gait             General Gait Details: unable  Stairs            Wheelchair Mobility    Modified Rankin (Stroke Patients Only)       Balance Overall balance assessment: Needs assistance Sitting-balance support: Bilateral upper extremity supported Sitting balance-Leahy Scale: Fair Sitting balance - Comments: Pt with min lean on BUEs to maintain static sitting at EOB       Standing balance comment: Unable                             Pertinent Vitals/Pain Pain Assessment: No/denies pain    Home Living Family/patient expects to be discharged to:: Group home  Additional Comments: 1 level home, with ramp access    Prior Function Level of Independence: Needs assistance   Gait / Transfers Assistance Needed: Ind amb community distances with SPC, one fall in last 6 months just prior to this admission with LOB while changing clothes  ADL's / Homemaking Assistance Needed: Ind with dressing, assistance with bathing, meals, and meds        Hand Dominance        Extremity/Trunk Assessment   Upper Extremity  Assessment Upper Extremity Assessment: Generalized weakness    Lower Extremity Assessment Lower Extremity Assessment: Generalized weakness       Communication   Communication: No difficulties  Cognition Arousal/Alertness: Awake/alert Behavior During Therapy: WFL for tasks assessed/performed Overall Cognitive Status: Within Functional Limits for tasks assessed                                        General Comments      Exercises Total Joint Exercises Ankle Circles/Pumps: Strengthening;Both;10 reps Quad Sets: Strengthening;Both;10 reps Gluteal Sets: Strengthening;Both;10 reps Short Arc Quad: Strengthening;Both;10 reps Heel Slides: AROM;Both;5 reps Hip ABduction/ADduction: AAROM;Both;10 reps Straight Leg Raises: AAROM;Both;10 reps Long Arc Quad: Strengthening;Both;10 reps Knee Flexion: Strengthening;Both;10 reps  Other: rolling left/right per MD direction to assist MD with examining L/R flank with cues for sequencing    Assessment/Plan    PT Assessment Patient needs continued PT services  PT Problem List Decreased strength;Decreased activity tolerance;Decreased mobility;Decreased knowledge of use of DME;Decreased balance       PT Treatment Interventions Balance training;Gait training;Functional mobility training;Therapeutic activities;DME instruction;Therapeutic exercise;Patient/family education    PT Goals (Current goals can be found in the Care Plan section)  Acute Rehab PT Goals Patient Stated Goal: To get stronger PT Goal Formulation: With patient Time For Goal Achievement: 05/31/19 Potential to Achieve Goals: Good    Frequency Min 2X/week   Barriers to discharge Inaccessible home environment;Decreased caregiver support      Co-evaluation               AM-PAC PT "6 Clicks" Mobility  Outcome Measure Help needed turning from your back to your side while in a flat bed without using bedrails?: A Little Help needed moving from lying on  your back to sitting on the side of a flat bed without using bedrails?: A Lot Help needed moving to and from a bed to a chair (including a wheelchair)?: Total Help needed standing up from a chair using your arms (e.g., wheelchair or bedside chair)?: A Lot Help needed to walk in hospital room?: Total Help needed climbing 3-5 steps with a railing? : Total 6 Click Score: 10    End of Session Equipment Utilized During Treatment: Oxygen Activity Tolerance: Patient tolerated treatment well Patient left: in bed;with call bell/phone within reach;with bed alarm set Nurse Communication: Mobility status PT Visit Diagnosis: Difficulty in walking, not elsewhere classified (R26.2);Muscle weakness (generalized) (M62.81)    Time: 2536-6440 PT Time Calculation (min) (ACUTE ONLY): 39 min   Charges:   PT Evaluation $PT Eval Moderate Complexity: 1 Mod PT Treatments $Therapeutic Exercise: 8-22 mins $Therapeutic Activity: 8-22 mins        D. Royetta Asal PT, DPT 05/18/19, 4:46 PM

## 2019-05-18 NOTE — Progress Notes (Signed)
1        [facility] NAME: Alan Small    MR#:  [id number]  DATE OF BIRTH:  1955/12/07  SUBJECTIVE:  CHIEF COMPLAINT:   Chief Complaint  Patient presents with  . Shortness of Breath  No complaints, sitting in the bed, in sinus tach REVIEW OF SYSTEMS:  Review of Systems  Constitutional: Negative for diaphoresis, fever, malaise/fatigue and weight loss.  HENT: Negative for ear discharge, ear pain, hearing loss, nosebleeds, sore throat and tinnitus.   Eyes: Negative for blurred vision and pain.  Respiratory: Negative for cough, hemoptysis, shortness of breath and wheezing.   Cardiovascular: Negative for chest pain, palpitations, orthopnea and leg swelling.  Gastrointestinal: Negative for abdominal pain, blood in stool, constipation, diarrhea, heartburn, nausea and vomiting.  Genitourinary: Negative for dysuria, frequency and urgency.  Musculoskeletal: Negative for back pain and myalgias.  Skin: Negative for itching and rash.  Neurological: Negative for dizziness, tingling, tremors, focal weakness, seizures, weakness and headaches.  Psychiatric/Behavioral: Negative for depression. The patient is not nervous/anxious.     DRUG ALLERGIES:   Allergies  Allergen Reactions  . Mellaril [Thioridazine] Shortness Of Breath and Swelling  . Buprenorphine Itching  . Morphine And Related Itching  . Navane [Thiothixene] Other (See Comments)    Reaction:  GI upset   . Thorazine [Chlorpromazine] Other (See Comments)    Reaction:  Dizziness/fainting    VITALS:  Blood pressure 130/70, pulse (!) 106, temperature 98.6 F (37 C), temperature source Axillary, resp. rate (!) 27, height 6' (1.829 m), weight 85.5 kg, SpO2 98 %. PHYSICAL EXAMINATION:  Physical Exam HENT:     Head: Normocephalic and atraumatic.  Eyes:     Conjunctiva/sclera: Conjunctivae normal.     Pupils: Pupils are equal, round, and reactive to light.  Neck:     Thyroid: No thyromegaly.   Trachea: No tracheal deviation.  Cardiovascular:     Rate and Rhythm: Normal rate and regular rhythm.     Heart sounds: Normal heart sounds.  Pulmonary:     Effort: Pulmonary effort is normal. No respiratory distress.     Breath sounds: Normal breath sounds. No wheezing.  Chest:     Chest wall: No tenderness.  Abdominal:     General: Bowel sounds are normal. There is no distension.     Palpations: Abdomen is soft.     Tenderness: There is no abdominal tenderness.     Comments: fullness rt flank area with some mild erythema  Musculoskeletal:        General: Normal range of motion.     Cervical back: Normal range of motion and neck supple.  Skin:    General: Skin is warm and dry.     Findings: No rash.  Neurological:     Mental Status: He is alert and oriented to person, place, and time.     Cranial Nerves: No cranial nerve deficit.    LABORATORY PANEL:  Male CBC Recent Labs  Lab 05/18/19 0532  WBC 17.3*  HGB 9.7*  HCT 30.2*  PLT 185   ------------------------------------------------------------------------------------------------------------------ Chemistries  Recent Labs  Lab 05/18/19 0532  NA 146*  K 3.4*  CL 111  CO2 24  GLUCOSE 126*  BUN 47*  CREATININE 2.64*  CALCIUM 7.7*  MG 2.0   RADIOLOGY:  ECHO TEE  Result Date: 05/18/2019   TRANSESOPHOGEAL ECHO REPORT   Patient Name:   Alan Small Date of Exam: 05/18/2019 Medical Rec #:  [id number]      Height:       72.0 in Accession #:    [id number]     Weight:       188.5 lb Date of Birth:  [birth year]      BSA:          2.08 m Patient Age:    63 years       BP:           117/72 mmHg Patient Gender: M              HR:           101 bpm. Exam Location:  [facility]  Procedure: Transesophageal Echo, Cardiac Doppler and Color Doppler Indications:     R78.81 Bacteremia  History:         Patient has prior history of Echocardiogram examinations.  Sonographer:     Charmayne Sheer RDCS (AE) Referring Phys:  [id number] PAULA V ROSS Diagnosing  Phys: Kate Sable MD  PROCEDURE: After discussion of the risks and benefits of a TEE, an informed consent was obtained Education officer, museum. Local oropharyngeal anesthetic was provided with viscous lidocaine. Patients was under conscious sedation during this procedure. Anesthetic was administered intravenously by performing Physician: 78mcg of Fentanyl, 1.0mg  of Versed. The transesophogeal probe was passed through the esophogus of the patient. Imaged were obtained with the patient in a left lateral decubitus position. Image quality was good. The patient developed no complications during the procedure. IMPRESSIONS  1. Left ventricular ejection fraction, by visual estimation, is 60 to 65%. The left ventricle has normal function. There is no left ventricular hypertrophy.  2. Left ventricular diastolic function could not be evaluated.  3. The left ventricle has no regional wall motion abnormalities.  4. No evidence of vegetation/infection noted in any of the valves or myocardium.  5. Global right ventricle has normal systolic function.The right ventricular size is normal. Right vetricular wall thickness was not assessed.  6. Left atrial size was normal.  7. Right atrial size was normal.  8. The mitral valve is degenerative. Trivial mitral valve regurgitation.  9. The tricuspid valve is normal in structure. Tricuspid valve regurgitation is not demonstrated. 10. Aortic valve regurgitation is mild to moderate. 11. The aortic valve is tricuspid. Aortic valve regurgitation is mild to moderate. 12. The pulmonic valve was grossly normal. Pulmonic valve regurgitation is not visualized. FINDINGS  Left Ventricle: Left ventricular ejection fraction, by visual estimation, is 60 to 65%. The left ventricle has normal function. The left ventricle has no regional wall motion abnormalities. The left ventricular internal cavity size was the left ventricle is normal in size. There is no left ventricular hypertrophy. Left ventricular  diastolic function could not be evaluated. No evidence of vegetation/infection noted in any of the valves or myocardium. Right Ventricle: The right ventricular size is normal. Right vetricular wall thickness was not assessed. Global RV systolic function is has normal systolic function. Left Atrium: Left atrial size was normal in size. Right Atrium: Right atrial size was normal in size Pericardium: There is no evidence of pericardial effusion. Mitral Valve: The mitral valve is degenerative in appearance. Trivial mitral valve regurgitation. No vegetation noted. Tricuspid Valve: The tricuspid valve is normal in structure. Tricuspid valve regurgitation is not demonstrated. No vegetation noted. Aortic Valve: The aortic valve is tricuspid. Aortic valve regurgitation is mild to moderate. No vegetation noted. Pulmonic Valve: The pulmonic valve was grossly normal. Pulmonic valve regurgitation is not visualized. No vegetation. Aorta: The  aortic root and ascending aorta are structurally normal, with no evidence of dilitation. Venous: The inferior vena cava was not well visualized. Shunts: No atrial level shunt detected by color flow Doppler.  Kate Sable MD Electronically signed by Kate Sable MD Signature Date/Time: 05/18/2019/1:38:50 PM    Final    [place] EKG SITE RITE  Result Date: 05/18/2019 If Site Rite image not attached, placement could not be confirmed due to current cardiac rhythm.  ASSESSMENT AND PLAN:  63 y.o. male with history of solitary left kidney complicated by partial obstructing ureteral calculus status post emergency ureteral stent placement, MRSA bacteremia, and COPD admitted initially by PCCM for respiratory failure   Acute respiratory failure due to COPD exacerbation in the setting of Severe sepsis -present on admission -2 L oxygen via nasal cannula now  Severe sepsiswith septic shock -MRSA bacteremia likely from previous central line which is removed TEE is negative for any  vegetation Continue Teflaro and daptomycin per ID He currently has a left IJ placed on 05/12/2019 when he was bacteremic.  This line will have to be removed now.   - he does have septic emboli with cavitary lesions bilaterally in his lungs.  So we will need at least 4-6 weeks of antibiotics. So far repeat blood cultures have been negative.  If remains negative for 48 hours PICC line may be placed. 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  The stricture was dilated.  The stent was placed in the left ureter.  The urine culture on 05/11/2019 was negative.  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 fib/flutter with rapid ventricular rate -Currently in sinus tach with PVCs/PACs -On amnio drip, if able to swallow can start him on Lopressor for rate control cardiology -Anticoagulation not recommended due to history of fall -Echo with normal LV function  Alcohol withdrawal syndrome -Monitor for withdrawal  Acute on chronic renal failure Solitary kidney Avoid nephrotoxins Maintain perfusion,avoid hypotension Volume resuscitate Creatinine 2.64, consider nephro consultation if no improvement  Paranoid schizophrenia Resume home medications as soon as feasible   Hypernatremia/hypokalemia Replete and recheck  DVT prophylaxis:heparin SQ  GI prophylax:PPI      All the records are reviewed and case discussed with Care Management/Social Worker. Management plans discussed with the patient, nursing and they are in agreement.  CODE STATUS: Full Code  TOTAL TIME TAKING CARE OF THIS PATIENT: 35 minutes.   More than 50% of the time was spent in  counseling/coordination of care: YES  POSSIBLE D/C IN 3-4 DAYS, DEPENDING ON CLINICAL CONDITION.   Max Sane M.D on 05/18/2019 at 5:39 PM  Between 7am to 6pm - Pager - [phone]  After 6pm go to [email or web address] - password [email or web address]  [facility]   CC: Primary care physician; [name], MD  Note: This dictation was prepared with Dragon dictation along with smaller phrase technology. Any transcriptional errors that result from this process are unintentional.

## 2019-05-18 NOTE — Consult Note (Signed)
Date of Admission:  05/11/2019     ID: Alan Small is a 63 y.o. male  Active Problems:   Severe sepsis ([place])    Subjective: Pt is awake and alert, participating with PT- sat on the edge of the bed- Did not c/o pain rt flank  Medications:  . butamben-tetracaine-benzocaine      . chlorhexidine  15 mL Mouth Rinse BID  . Chlorhexidine Gluconate Cloth  6 each Topical Daily  . fentaNYL      . heparin  5,000 Units Subcutaneous Q8H  . ipratropium-albuterol  3 mL Nebulization TID  . lidocaine      . mouth rinse  15 mL Mouth Rinse q12n4p  . midazolam      . multivitamin  15 mL Oral Daily  . nicotine  21 mg Transdermal Daily  . pantoprazole (PROTONIX) IV  40 mg Intravenous QHS  . PHENObarbital  30 mg Intravenous BID  . sodium chloride flush  10-40 mL Intracatheter Q12H  . sodium chloride flush        Objective: Vital signs in last 24 hours: Temp:  [97.9 F (36.6 C)-100 F (37.8 C)] 99 F (37.2 C) (12/16 1200) Pulse Rate:  [84-124] 92 (12/16 1300) Resp:  [17-30] 26 (12/16 1300) BP: (99-139)/(49-78) 139/70 (12/16 1300) SpO2:  [92 %-100 %] 96 % (12/16 1300) Weight:  [85.5 kg] 85.5 kg (12/16 0930)  PHYSICAL EXAM:  General: Alert, cooperative, no distress,  Head: Normocephalic, without obvious abnormality, atraumatic. Eyes: Conjunctivae clear, anicteric sclerae. Pupils are equal ENT Nares normal. No drainage or sinus tenderness. Lips, mucosa, and tongue normal. No Thrush Neck: Supple Left IJ  Back: fullness rt flank area with some mild erythema- no tenderness  Lungs:b/l air entry- decreased bases Heart: irregular Abdomen: Soft, fullness rt flank/side Extremities: atraumatic, no cyanosis. No edema. No clubbing Skin: No rashes or lesions. Or bruising Lymph: Cervical, supraclavicular normal. Neurologic: Grossly non-focal  Lab Results Recent Labs    05/17/19 0512 05/17/19 2118 05/18/19 0532  WBC 14.8*  --  17.3*  HGB 9.4*  --  9.7*  HCT 29.4*  --  30.2*  NA 146*   --  146*  K 3.1* 3.4* 3.4*  CL 111  --  111  CO2 25  --  24  BUN 49*  --  47*  CREATININE 2.67*  --  2.64*   Liver Panel No results for input(s): PROT, ALBUMIN, AST, ALT, ALKPHOS, BILITOT, BILIDIR, IBILI in the last 72 hours. Sedimentation Rate No results for input(s): ESRSEDRATE in the last 72 hours. C-Reactive Protein No results for input(s): CRP in the last 72 hours.  Microbiology:  Studies/Results: ECHO TEE  Result Date: 05/18/2019 Kate Sable, MD     05/18/2019 10:17 AM Transesophageal Echocardiogram : Indication: mrsa bacteremia Requesting/ordering  physician: Dr. Harrington Challenger Procedure: 2 mls of viscous lidocaine were given orally to provide local anesthesia to the oropharynx. The patient was positioned supine on the left side, bite block provided. The patient was moderately sedated with the doses of versed and fentanyl as detailed below.  Using digital technique an omniplane probe was advanced into the esophagus without incident. Moderate sedation: 1. Sedation used:  Versed: 1mg , Fentanyl: 58mcg 2. Time administered:  9:50   Time when patient started recovery:10:05 3. I was face to face during this time: 15 mins total See report in EPIC  for complete details: In brief, imaging revealed No evidence for endocarditis. There was moderate Aortic regurgitation. There was normal LV function with  no RWMAs and no mural apical thrombus. Estimated ejection fraction was 55%. Imaging of the septum showed no ASD or VSD per color dopler The LA was well visualized in orthogonal views.  There was no spontaneous contrast and no thrombus in the LA and LA appendage The descending thoracic aorta had no  mural aortic debris with no evidence of aneurysmal dilation or disection [name] 05/18/2019 10:10 AM  [place] EKG SITE RITE  Result Date: 05/18/2019 If Site Rite image not attached, placement could not be confirmed due to current cardiac rhythm.    Assessment/Plan:  MRSA bacteremia from a  previous central line which he had on presentation to [facility] and that has been removed. He currently has a left IJ placed on 05/12/2019 when he was bacteremic.  This line will have to be removed now.  A new PICC line can only be placed after a repeat culture has been sent after the primary line has been removed and if that is negative in 48 hours a new central line can be placed.  TEE has shown no endocarditis.  But he does have septic emboli with cavitary lesions bilaterally in his lungs.  So we will need to treated like tricuspid endocarditis for at least 4-6 weeks of antibiotics. So far repeat blood cultures have been negative. Is currently on ceftaroline and daptomycin has been added for VRE in the urine culture.   Intermittent fever since 05/13/2019.   With leucocytosis : This is now panning out to be due to phlegmon, pyomyositis, of the rt flank area and less likely a urinary source! He will need a repeat imaging and posisble surgical intervention   Patient was admitted on 05/11/2019 with flank pain, shortness of breath.  He had 2 things going on simultaneously 1 was MRSA bacteremia and another was a partially obstructing left ureteral stone at the pelvic inlet area.  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  The stricture was dilated.  The stent was placed in the left ureter.  The urine culture on 05/11/2019 was negative.  A repeat urine culture was sent on 05/16/2019 and that is positive for VRE.  Previously November when he was at [facility] the ureteral stone and a urine culture had VRE but he was never treated.  So it looks like the stone that he has is   colonized with VRE and when it was manipulated by placing a stent,it became a true infection and  the urine subsequently had the bacteria as well.  So when urine culture was done on 12/14 because of his intermittent fever we were able to identify the VRE in the urine. Clinically this is not a CAUTI but an  infection due to infected stone. He is on daptomycin  AKI on CKD  History of left hydronephrosis, at one time had percutaneous nephrostomy, stent, complicated by retroperitoneal abscess due to E. coli and Enterococcus and had been treated at [facility]. History of ESBL E. coli pyelonephritis and history of Enterococcus faecalis retroperitoneal abscess status post drain placement at [facility] and received nearly 2 months of IV imipenem which he completed mid-November.  History of right nephrectomy  Discussed the management with the patient and the nurse and the care team.

## 2019-05-18 NOTE — Evaluation (Signed)
Clinical/Bedside Swallow Evaluation Patient Details  Name: NOLBERTO CHEUVRONT MRN: [id number] Date of Birth: [birth year]  Today's Date: 05/18/2019 Time: SLP Start Time (ACUTE ONLY): 1320 SLP Stop Time (ACUTE ONLY): 1415 SLP Time Calculation (min) (ACUTE ONLY): 55 min  Past Medical History:  Past Medical History:  Diagnosis Date  . Asthma   . COPD (chronic obstructive pulmonary disease) ([place])   . Depression   . External hemorrhoids   . Gastric ulcer   . GERD (gastroesophageal reflux disease)   . Hydronephrosis   . Hypothyroidism    hypo  . Insomnia   . Mental disorder   . Myocardial infarction ([place])   . Paranoid schizophrenia ([place])   . Renal disorder    renal failure  . Seizures ([place])   . Stroke [facility])    Past Surgical History:  Past Surgical History:  Procedure Laterality Date  . APPENDECTOMY    . COLONOSCOPY WITH PROPOFOL N/A 12/07/2014   Procedure: COLONOSCOPY WITH PROPOFOL;  Surgeon: Manya Silvas, MD;  Location: [facility] ENDOSCOPY;  Service: Endoscopy;  Laterality: N/A;  . COLONOSCOPY WITH PROPOFOL N/A 10/10/2016   Procedure: COLONOSCOPY WITH PROPOFOL;  Surgeon: Lucilla Lame, MD;  Location: [facility];  Service: Endoscopy;  Laterality: N/A;  . CYSTOSCOPY W/ URETERAL STENT PLACEMENT Left 01/21/2019   Procedure: CYSTOSCOPY WITH RETROGRADE PYELOGRAM/URETERAL STENT PLACEMENT;  Surgeon: Abbie Sons, MD;  Location: [facility] ORS;  Service: Urology;  Laterality: Left;  . CYSTOSCOPY WITH URETEROSCOPY AND STENT PLACEMENT Left 05/11/2019   Procedure: CYSTOSCOPY WITH URETEROSCOPY AND STENT PLACEMENT, LEFT;  Surgeon: Hollice Espy, MD;  Location: [facility] ORS;  Service: Urology;  Laterality: Left;  . ESOPHAGOGASTRODUODENOSCOPY N/A 12/07/2014   Procedure: ESOPHAGOGASTRODUODENOSCOPY (EGD);  Surgeon: Manya Silvas, MD;  Location: [facility] ENDOSCOPY;  Service: Endoscopy;  Laterality: N/A;  . NEPHRECTOMY    . POLYPECTOMY  10/10/2016   Procedure: POLYPECTOMY;  Surgeon: Lucilla Lame, MD;   Location: [place];  Service: Endoscopy;;  . SUPRAPUBIC CATHETER INSERTION     HPI:  Pt is a 63 year old current smoker with a solitary kidney (LEFT) and a h/o GERD, gastric ulcer, COPD, CVA, Paranoid schizophrenia who apparently is followed at [facility] urology.  History is obtained from available records as the patient cannot provide history due to being intubated and on a ventilator.  He is post placement of a ureteral stent for a partially obstructing ureteral calculus.  The patient was having symptoms of fatigue, shortness of breath and flank pain on presentation according to the records.  The patient was noted to be tachypneic and tachycardic and obviously appeared septic.  He was getting meropenem through a right IJ Port-A-Cath as an outpatient.  He has a history of ESBL in the past.  Pt was extubated on 05/13/2019.  He experienced agitation d/t Withdrawal/ETOH and required a sitter to prevent self harm; Precedex. He was confused and hallucinating but seems improved now; less agitation and confusion noted per [place] staff.    Assessment / Plan / Recommendation Clinical Impression  Pt appears to present w/ grossly adequate oropharyngeal phase swallowing function during po trials -- HOWEVER, pt is significantly impacted by his declined Pulmonary status w/ increased WOB/SOB and mouth breathing at Rest (at baseline). This Pulmonary presentation can increase risk for an aspiration event d/t poor timing and/or closure of the airway during the swallow (Apnea moment). Pt was given single boluses of trials and Rest Breaks after each one to allow him to calm his breathing to prepare for another po  trial. In following these strict aspiration precautions, pt consume trials of thin liquids and purees w/ no immediate, overt clinical s/s of aspiration noted -- no decline in O2 sats (94-95%, RR 24-28bpm) during/post trials. Vocal quality remained clear post trials when assessed. Pt exhibited a mild congested cough  to clear phlegm (per pt) 2x post swallows/trials - no decline in O2 sats or further decline in respiratory effort noted at that moment. Oral phase appeared grossly [facility] for bolus management of trials given; timely A-P transfer and oral clearing followed w/ trials. Pt helped to feed self by Holding the Cup (to increase safety w/ drinking and lessen risk for aspiration). OM exam appeared [facility] w/ no unilateral weakness noted. Due to pt's declined Pulmonary status and its potential impact on timing of swallowing, as well as his Edentulous status, recommend a Dysphagia level 1(PUREE) diet w/ thin liquids; strict aspiration precautions and feeding support; Pills in puree. MD/NSG updated. ST can f/u w/ trials to upgrade diet consistency (foods) when appropriate, and will continue to monitor for safety of thin liquids (need for an objective assessment).  SLP Visit Diagnosis: Dysphagia, oropharyngeal phase (R13.12)(impacted by Pulmonary decline)    Aspiration Risk  Mild aspiration risk;Risk for inadequate nutrition/hydration(reduced following precautions)    Diet Recommendation  Dysphagia level 1 (PUREE) w/ gravies; Thin liquids VIA CUP ONLY.  Strict aspiration precautions including frequent Rest Breaks to lessen SOB/WOB and risk for aspiration. Reflux precautions. Assistance at meals -- pt to hold cup to drink. No straws.   Medication Administration: Whole meds with puree(for safer swallowing)    Other  Recommendations Recommended Consults: (Dietician f/u) Oral Care Recommendations: Oral care BID;Staff/trained caregiver to provide oral care Other Recommendations: (n/a at this time)   Follow up Recommendations None(TBD)      Frequency and Duration min 3x week  2 weeks       Prognosis Prognosis for Safe Diet Advancement: Fair(-Good) Barriers to Reach Goals: Severity of deficits(Pulmonary decline)      Swallow Study   General Date of Onset: 05/11/19 HPI: Pt is a 63 year old current smoker with a  solitary kidney (LEFT) and a h/o GERD, gastric ulcer, COPD, CVA, Paranoid schizophrenia who apparently is followed at [facility] urology.  History is obtained from available records as the patient cannot provide history due to being intubated and on a ventilator.  He is post placement of a ureteral stent for a partially obstructing ureteral calculus.  The patient was having symptoms of fatigue, shortness of breath and flank pain on presentation according to the records.  The patient was noted to be tachypneic and tachycardic and obviously appeared septic.  He was getting meropenem through a right IJ Port-A-Cath as an outpatient.  He has a history of ESBL in the past.  Pt was extubated on 05/13/2019.  He experienced agitation d/t Withdrawal/ETOH and required a sitter to prevent self harm; Precedex. He was confused and hallucinating but seems improved now; less agitation and confusion noted per [place] staff.  Type of Study: Bedside Swallow Evaluation Previous Swallow Assessment: none Diet Prior to this Study: NPO(regular diet at home per pt) Temperature Spikes Noted: No(wbc 17.3 post procedure, agitation) Respiratory Status: Nasal cannula(2L) History of Recent Intubation: Yes Length of Intubations (days): 3 days Date extubated: 05/13/19 Behavior/Cognition: Alert;Cooperative;Pleasant mood;Requires cueing(min) Oral Cavity Assessment: Within Functional Limits Oral Care Completed by SLP: Yes Oral Cavity - Dentition: Edentulous Vision: Functional for self-feeding Self-Feeding Abilities: Able to feed self;Needs assist;Needs set up;Total assist(holds cup to drink) Patient  Positioning: Upright in bed(needed min positioning) Baseline Vocal Quality: Normal(though mouth breathing and easily SOB) Volitional Cough: Strong;Congested(min) Volitional Swallow: Able to elicit    Oral/Motor/Sensory Function Overall Oral Motor/Sensory Function: Within functional limits   Ice Chips Ice chips: Within functional  limits Presentation: Spoon(fed; 3 trials) Other Comments: mouth breathing at baseline   Thin Liquid Thin Liquid: Within functional limits(appeared grossly) Presentation: Cup;Self Fed(10 trials) Other Comments: congested cough to clear phlegm (per pt) noted x2 post swallows/trials - no decline in O2 sats or further decline in respiratory effort during/post trials.     Nectar Thick Nectar Thick Liquid: Not tested   Honey Thick Honey Thick Liquid: Not tested   Puree Puree: Within functional limits Presentation: Spoon(fed; 10 trials) Other Comments: mouth breathing at baseline   Solid     Solid: Not tested Other Comments: Edentulous and declined Pulmonary status at baseline       Orinda Kenner, MS, CCC-SLP Jahmel Flannagan 05/18/2019,2:41 PM

## 2019-05-18 NOTE — Progress Notes (Signed)
*  PRELIMINARY RESULTS* Echocardiogram Echocardiogram Transesophageal has been performed.  Alan Small Thales Knipple 05/18/2019, 10:20 AM

## 2019-05-18 NOTE — Progress Notes (Signed)
Nutrition Follow-up  DOCUMENTATION CODES:   Not applicable  INTERVENTION:  Provide Ensure Enlive po TID, each supplement provides 350 kcal and 20 grams of protein.  Continue monitoring potassium, phosphorus, and magnesium daily and replacing as needed as patient is at risk for refeeding syndrome.  NUTRITION DIAGNOSIS:   Inadequate oral intake related to inability to eat as evidenced by NPO status.  Resolving - diet now advanced.  GOAL:   Patient will meet greater than or equal to 90% of their needs  Progressing.  MONITOR:   PO intake, Supplement acceptance, Diet advancement, Labs, Weight trends, I & O's  REASON FOR ASSESSMENT:   Ventilator, Consult (assessment and recommendations for tube feeding)  ASSESSMENT:   63 year old male with PMHx of seizures, asthma, paranoid schizophrenia, hypothyroidism, depression, COPD, GERD, hd MI, hx CVA admitted with obstructing left ureteral calculus with solitary left kidney s/p left ureteral stent placement on 12/9, sepsis, acute on chronic renal insufficiency, and respiratory failure.  Patient was extubated on 12/11. Diet was unable to be advanced for several days due to agitation/confusion. On 12/15 patient was more awake and alert and SLP order was placed. RD spoke with patient on 12/15 and he reported he was hungry and ready to eat. He was also amenable to drinking ONS once diet advanced. This morning patient was taken for TEE so he was not in room at time of RD assessment. He was able to have SLP evaluation in the after and was advanced to dysphagia 1 diet with thin liquids.  Medications reviewed and include: liquid MVI daily, nicotine, pantoprazole, ceftaroline, daptomycin, D5W at 50 mL/hr.  Labs reviewed: CBG 91-111, Sodium 146, Potassium 3.4, BUN 47, Creatinine 2.64.  Discussed with RN and on rounds.  Diet Order:   Diet Order            DIET - DYS 1 Room service appropriate? Yes with Assist; Fluid consistency: Thin  Diet  effective now             EDUCATION NEEDS:   No education needs have been identified at this time  Skin:  Skin Assessment: Reviewed RN Assessment  Last BM:  Unknown/PTA  Height:   Ht Readings from Last 1 Encounters:  05/18/19 6' (1.829 m)   Weight:   Wt Readings from Last 1 Encounters:  05/18/19 85.5 kg   Ideal Body Weight:  80.9 kg  BMI:  Body mass index is 25.56 kg/m.  Estimated Nutritional Needs:   Kcal:  2016 (PSU 2003b w/ MSJ 1733, Ve 10.8, Tmax 37.3)  Protein:  108-135 grams (1.2-1.5 grams/kg)  Fluid:  2-2.2 L/day  Alan Barnacle, MS, RD, LDN Office: [phone] Pager: [phone] After Hours/Weekend Pager: [phone]

## 2019-05-18 NOTE — Progress Notes (Addendum)
Progress Note  Patient Name: Alan Small Date of Encounter: 05/18/2019  Primary Cardiologist: New  Subjective   No complaints this morning. Started on amiodarone gtt 05/17/2019 secondary to Afib with RVR. He was asymptomatic. Still awaiting consent for TEE from legal guardian.   Inpatient Medications    Scheduled Meds: . chlorhexidine  15 mL Mouth Rinse BID  . Chlorhexidine Gluconate Cloth  6 each Topical Daily  . heparin  5,000 Units Subcutaneous Q8H  . ipratropium-albuterol  3 mL Nebulization TID  . mouth rinse  15 mL Mouth Rinse q12n4p  . multivitamin  15 mL Oral Daily  . nicotine  21 mg Transdermal Daily  . pantoprazole (PROTONIX) IV  40 mg Intravenous QHS  . PHENObarbital  30 mg Intravenous BID  . sodium chloride flush  10-40 mL Intracatheter Q12H   Continuous Infusions: . sodium chloride 10 mL/hr at 05/15/19 1800  . amiodarone 30 mg/hr (05/17/19 1830)  . ceFTAROline (TEFLARO) IV 400 mg (05/17/19 2250)  . DAPTOmycin (CUBICIN)  IV 700 mg (05/17/19 2049)  . dextrose 5 % with kcl 100 mL/hr at 05/18/19 0647   PRN Meds: sodium chloride, acetaminophen, albuterol, LORazepam, midazolam, midazolam, ondansetron (ZOFRAN) IV, sodium chloride flush   Vital Signs    Vitals:   05/18/19 0500 05/18/19 0600 05/18/19 0700 05/18/19 0730  BP: 135/71 137/74 133/77   Pulse: 84 85 90   Resp: (!) 27 (!) 25 (!) 23   Temp:  98.8 F (37.1 C)    TempSrc:  Bladder    SpO2: 96% 96% 96% 96%  Weight: 85.5 kg     Height:        Intake/Output Summary (Last 24 hours) at 05/18/2019 0812 Last data filed at 05/18/2019 0600 Gross per 24 hour  Intake 10 ml  Output 1500 ml  Net -1490 ml   Filed Weights   05/16/19 0500 05/17/19 0500 05/18/19 0500  Weight: 82.9 kg 84.4 kg 85.5 kg    Telemetry    Currently in sinus tachycardia with PACs and PVCs, short runs of coarse Afib vs atrial flutter with variable AV block on 12/15 - Personally Reviewed  ECG    No new tracings - Personally  Reviewed  Physical Exam   GEN: No acute distress.   Neck: No JVD. Cardiac: Mildly tachycardic, irregular, no murmurs, rubs, or gallops.  Respiratory: Mildly diminished breath sounds along the bilateral bases.  GI: Soft, nontender, non-distended.   MS: No edema; No deformity. Neuro:  Alert and oriented x 3; Nonfocal.  Psych: Normal affect.  Labs    Chemistry Recent Labs  Lab 05/11/19 1047 05/11/19 1644 05/14/19 0434 05/15/19 0516 05/15/19 1816 05/17/19 0512 05/17/19 2118 05/18/19 0532  NA 133*   < > 147* 151* 151* 146*  --  146*  K 3.9   < > 3.5 4.0  --  3.1* 3.4* 3.4*  CL 103   < > 112* 117*  --  111  --  111  CO2 15*   < > 27 26  --  25  --  24  GLUCOSE 152*   < > 108* 101*  --  115*  --  126*  BUN 47*   < > 56* 53*  --  49*  --  47*  CREATININE 3.47*  --  2.60* 2.26*  --  2.67*  --  2.64*  CALCIUM 8.3*   < > 8.2* 8.4*  --  7.6*  --  7.7*  PROT 6.2*  --   --   --   --   --   --   --  ALBUMIN 2.6*  --  2.0*  --   --   --   --   --   AST 51*  --   --   --   --   --   --   --   ALT 43  --   --   --   --   --   --   --   ALKPHOS 80  --   --   --   --   --   --   --   BILITOT 0.8  --   --   --   --   --   --   --   GFRNONAA 18*  --  25* 30*  --  24*  --  24*  GFRAA 20*  --  29* 34*  --  28*  --  28*  ANIONGAP 15   < > 8 8  --  10  --  11   < > = values in this interval not displayed.     Hematology Recent Labs  Lab 05/14/19 0434 05/17/19 0512 05/18/19 0532  WBC 13.3* 14.8* 17.3*  RBC 3.61* 3.26* 3.36*  HGB 10.3* 9.4* 9.7*  HCT 32.5* 29.4* 30.2*  MCV 90.0 90.2 89.9  MCH 28.5 28.8 28.9  MCHC 31.7 32.0 32.1  RDW 14.2 14.5 14.8  PLT 107* 148* 185    Cardiac EnzymesNo results for input(s): TROPONINI in the last 168 hours. No results for input(s): TROPIPOC in the last 168 hours.   BNPNo results for input(s): BNP, PROBNP in the last 168 hours.   DDimer No results for input(s): DDIMER in the last 168 hours.   Radiology    No results found.  Cardiac  Studies   2D Echo 05/12/2019: 1. Left ventricular ejection fraction, by visual estimation, is 60 to 65%. The left ventricle has normal function. There is no left ventricular hypertrophy.  2. Left ventricular diastolic parameters are consistent with Grade I diastolic dysfunction (impaired relaxation).  3. The left ventricle has no regional wall motion abnormalities.  4. Global right ventricle has normal systolic function.The right ventricular size is normal. No increase in right ventricular wall thickness.  5. Left atrial size was normal.  6. TR signal is inadequate for assessing pulmonary artery systolic pressure.  7. No valve vegetation noted  Patient Profile     63 y.o. male with history of solitary left kidney complicated by partial obstructing ureteral calculus requiring emergency ureteral stent placement, MRSA bacteremia, and COPD, whom we have been asked to see due to bacteremia.  Assessment & Plan    1. MRSA bacteremia: -TEE has been recommended, though we have had difficulty in obtaining consent for procedure from his legal guardian  -There is concern the patient does not fully comprehend risks/benefits  -I have reached out to his legal guardian, Lonia Chimera at [phone] again to discuss this procedure, though had to leave a voicemail -I have instructed her to call the hospital this morning and ask for the ICU RN taking care of this patient to go over consent  -Continue ABX per ID/CCM -NPO  2. Afib/flutter with RVR: -Currently in sinus with PACs/PVCs -Remains on amiodarone gtt, can discontinue when he passes swallow study with initiation of Lopressor 25 mg bid  -CHADS2VASc of 1 -Patient is a fall risk -There is questionable history of prior stroke, though head CT from 08/2018 showed no evidence of prior infarct -With the above in mind, we will hold  off on [place] at this time -Recommend outpatient cardiac monitoring in follow up  3. Chest pain: -No further symptoms -Echo  with preserved LVSF as above -HS-Tn not consistent with ACS -Consider outpatient ischemic evaluation   For questions or updates, please contact [facility] Please consult [email or web address] for contact info under Cardiology/STEMI.    Signed, Christell Faith, PA-C [place] Pager: [phone] 05/18/2019, 8:12 AM

## 2019-05-18 NOTE — Procedures (Signed)
Transesophageal Echocardiogram :  Indication: mrsa bacteremia Requesting/ordering  physician: Dr. Harrington Challenger  Procedure: 2 mls of viscous lidocaine were given orally to provide local anesthesia to the oropharynx. The patient was positioned supine on the left side, bite block provided. The patient was moderately sedated with the doses of versed and fentanyl as detailed below.  Using digital technique an omniplane probe was advanced into the esophagus without incident.   Moderate sedation: 1. Sedation used:  Versed: 1mg , Fentanyl: 13mcg 2. Time administered:  9:50   Time when patient started recovery:10:05 3. I was face to face during this time: 15 mins total  See report in EPIC  for complete details: In brief, imaging revealed No evidence for endocarditis. There was moderate Aortic regurgitation. There was normal LV function with no RWMAs and no mural apical thrombus. Estimated ejection fraction was 55%.   Imaging of the septum showed no ASD or VSD per color dopler  The LA was well visualized in orthogonal views.  There was no spontaneous contrast and no thrombus in the LA and LA appendage   The descending thoracic aorta had no  mural aortic debris with no evidence of aneurysmal dilation or disection   Alan Small 05/18/2019 10:10 AM

## 2019-05-18 NOTE — Progress Notes (Signed)
PHARMACY - PHYSICIAN COMMUNICATION CRITICAL VALUE ALERT - BLOOD CULTURE IDENTIFICATION (BCID)  Alan Small is an 63 y.o. male who presented to [facility] on 05/11/2019 with a chief complaint of septic shock s/t renal calculus  Assessment:  Patient w/ MRSA bacteremia s/t line infx w/ concomitant E. Faecalis from renal calculi culture on ceftraolin for MRSA bacteremia and daptomycin for E. Faecalis pyelonephritis s/t nephrolithiasis s/p ureteral stent placement. Growing 1/4 GPC no BCID due to only 1 bottle resulted.  Name of physician (or Provider) Contacted: ID on board following patient w/ ID pharmacy resident as well.  Current antibiotics: Daptomycin 700 mg IV daily + Ceftaroline 400 mg IV q12h.  Changes to prescribed antibiotics recommended:  Patient is being followed by ID, no changes necesary at this time.  Results for orders placed or performed during the hospital encounter of 05/11/19  Blood Culture ID Panel (Reflexed) (Collected: 05/11/2019 11:14 AM)  Result Value Ref Range   Enterococcus species NOT DETECTED NOT DETECTED   Listeria monocytogenes NOT DETECTED NOT DETECTED   Staphylococcus species DETECTED (A) NOT DETECTED   Staphylococcus aureus (BCID) DETECTED (A) NOT DETECTED   Methicillin resistance DETECTED (A) NOT DETECTED   Streptococcus species NOT DETECTED NOT DETECTED   Streptococcus agalactiae NOT DETECTED NOT DETECTED   Streptococcus pneumoniae NOT DETECTED NOT DETECTED   Streptococcus pyogenes NOT DETECTED NOT DETECTED   Acinetobacter baumannii NOT DETECTED NOT DETECTED   Enterobacteriaceae species NOT DETECTED NOT DETECTED   Enterobacter cloacae complex NOT DETECTED NOT DETECTED   Escherichia coli NOT DETECTED NOT DETECTED   Klebsiella oxytoca NOT DETECTED NOT DETECTED   Klebsiella pneumoniae NOT DETECTED NOT DETECTED   Proteus species NOT DETECTED NOT DETECTED   Serratia marcescens NOT DETECTED NOT DETECTED   Haemophilus influenzae NOT DETECTED NOT DETECTED   Neisseria meningitidis NOT DETECTED NOT DETECTED   Pseudomonas aeruginosa NOT DETECTED NOT DETECTED   Candida albicans NOT DETECTED NOT DETECTED   Candida glabrata NOT DETECTED NOT DETECTED   Candida krusei NOT DETECTED NOT DETECTED   Candida parapsilosis NOT DETECTED NOT DETECTED   Candida tropicalis NOT DETECTED NOT DETECTED   Tobie Lords, PharmD, BCPS Clinical Pharmacist 05/18/2019  2:02 AM

## 2019-05-18 NOTE — Progress Notes (Signed)
SLP Cancellation Note  Patient Details Name: Alan Small MRN: [id number] DOB: [birth year]   Cancelled treatment:       Reason Eval/Treat Not Completed: Patient at procedure or test/unavailable(chart reviewed; consulted Unit. Pt is NPO for test.). ST services will f/u this afternoon for appropriate time permitting/pt appropriateness.     Orinda Kenner, MS, CCC-SLP Shantanu Strauch 05/18/2019, 10:29 AM

## 2019-05-18 NOTE — Consult Note (Signed)
PHARMACY CONSULT NOTE - FOLLOW UP  Pharmacy Consult for Electrolyte Monitoring and Replacement   Recent Labs: Potassium (mmol/L)  Date Value  05/18/2019 3.4 (L)  09/19/2014 3.7   Magnesium (mg/dL)  Date Value  05/18/2019 2.0  12/08/2012 1.8   Calcium (mg/dL)  Date Value  05/18/2019 7.7 (L)   Calcium, Total (mg/dL)  Date Value  09/19/2014 8.9   Albumin (g/dL)  Date Value  05/14/2019 2.0 (L)  09/19/2014 3.5   Phosphorus (mg/dL)  Date Value  05/18/2019 4.0   Sodium (mmol/L)  Date Value  05/18/2019 146 (H)  09/19/2014 16   63 year old male admitted with MRSA bacteremia with a past medical history of asthma, COPD, depression, gastric ulcer, GERD, hypothyroid, MI, stroke, schizophrenia, and seizure.  He has a complicated urological history as well, as he only possesses one kidney with emergent stent placement this admission.  Currently extubated, and also on an amiodarone drip for atrial fibrillation. Patient to have TEE today, 12/16.   Goal of Therapy:  All electrolytes wnl  Plan:  Per AM ICU rounds, patient did not receive ordered fluids overnight. Will continue D5/Potassium 15mEq at 133mL/hr.   Will obtain BMP with am labs.   Pharmacy will continue to monitor and adjust per consult.   Makenzee Choudhry L, RPh  05/18/2019 1:17 PM

## 2019-05-18 NOTE — Progress Notes (Addendum)
Urology Inpatient Progress Note  Subjective: Alan Small is a 63 y.o. male admitted on 05/11/2019 with a partially obstructing 5 mm left ureteral calculus with a solitary left kidney, sepsis with MRSA bacteremia, respiratory failure, and acute on chronic renal insufficiency.  He is POD 7 from urgent left ureteral stent placement with Dr. Erlene Quan.  Bacteremia and respiratory failure since resolved.  He is extubated and remains in the ICU.  Creatinine stable at 2.67.  Foley catheter remains in place draining clear, yellow urine.  Hospitalization has been complicated by right chest wall and flank cellulitis, on antibiotics as below.   Today, he reports left flank pain.  No acute concerns.  Anti-infectives: Anti-infectives (From admission, onward)   Start     Dose/Rate Route Frequency Ordered Stop   05/17/19 2000  DAPTOmycin (CUBICIN) 700 mg in sodium chloride 0.9 % IVPB     700 mg 228 mL/hr over 30 Minutes Intravenous Daily 05/17/19 1811     05/13/19 0000  vancomycin (VANCOCIN) 1,250 mg in sodium chloride 0.9 % 250 mL IVPB  Status:  Discontinued     1,250 mg 166.7 mL/hr over 90 Minutes Intravenous Every 36 hours 05/12/19 1334 05/12/19 1635   05/12/19 2200  ceftaroline (TEFLARO) 300 mg in sodium chloride 0.9 % 250 mL IVPB  Status:  Discontinued     300 mg 250 mL/hr over 60 Minutes Intravenous Every 12 hours 05/12/19 1636 05/12/19 1638   05/12/19 2200  ceftaroline (TEFLARO) 400 mg in sodium chloride 0.9 % 250 mL IVPB     400 mg 250 mL/hr over 60 Minutes Intravenous Every 12 hours 05/12/19 1638     05/12/19 0000  meropenem (MERREM) 500 mg in sodium chloride 0.9 % 100 mL IVPB  Status:  Discontinued     500 mg 200 mL/hr over 30 Minutes Intravenous Every 12 hours 05/11/19 2020 05/12/19 1635   05/11/19 2345  vancomycin (VANCOCIN) 1,750 mg in sodium chloride 0.9 % 500 mL IVPB     1,750 mg 250 mL/hr over 120 Minutes Intravenous  Once 05/11/19 2326 05/12/19 0212   05/11/19 2326  vancomycin  variable dose per unstable renal function (pharmacist dosing)  Status:  Discontinued      Does not apply See admin instructions 05/11/19 2326 05/12/19 1334   05/11/19 1145  meropenem (MERREM) 1 g in sodium chloride 0.9 % 100 mL IVPB     1 g 200 mL/hr over 30 Minutes Intravenous  Once 05/11/19 1132 05/11/19 1235      Current Facility-Administered Medications  Medication Dose Route Frequency Provider Last Rate Last Admin  . 0.9 %  sodium chloride infusion   Intravenous PRN Ottie Glazier, MD 10 mL/hr at 05/15/19 1800 Rate Verify at 05/15/19 1800  . acetaminophen (TYLENOL) tablet 650 mg  650 mg Oral Q4H PRN Tyler Pita, MD      . albuterol (PROVENTIL) (2.5 MG/3ML) 0.083% nebulizer solution 2.5 mg  2.5 mg Nebulization Q4H PRN Tyler Pita, MD      . amiodarone (NEXTERONE PREMIX) 360-4.14 MG/200ML-% (1.8 mg/mL) IV infusion  30 mg/hr Intravenous Continuous Flora Lipps, MD 16.67 mL/hr at 05/17/19 1830 30 mg/hr at 05/17/19 1830  . ceftaroline (TEFLARO) 400 mg in sodium chloride 0.9 % 250 mL IVPB  400 mg Intravenous Q12H Ravishankar, Joellyn Quails, MD 250 mL/hr at 05/17/19 2250 400 mg at 05/17/19 2250  . chlorhexidine (PERIDEX) 0.12 % solution 15 mL  15 mL Mouth Rinse BID Ottie Glazier, MD   15 mL at 05/18/19  13  . Chlorhexidine Gluconate Cloth 2 % PADS 6 each  6 each Topical Daily Bradly Bienenstock, NP   6 each at 05/17/19 [phone]  . DAPTOmycin (CUBICIN) 700 mg in sodium chloride 0.9 % IVPB  700 mg Intravenous Q2000 Tsosie Billing, MD 228 mL/hr at 05/17/19 2049 700 mg at 05/17/19 2049  . dextrose 5 % 1,000 mL with potassium chloride 40 mEq infusion   Intravenous Continuous Charlett Nose, RPH 100 mL/hr at 05/18/19 0647 New Bag at 05/18/19 0647  . heparin injection 5,000 Units  5,000 Units Subcutaneous Q8H Tyler Pita, MD   5,000 Units at 05/18/19 0517  . ipratropium-albuterol (DUONEB) 0.5-2.5 (3) MG/3ML nebulizer solution 3 mL  3 mL Nebulization TID Flora Lipps, MD   3 mL  at 05/18/19 0729  . LORazepam (ATIVAN) injection 1 mg  1 mg Intravenous Q6H PRN Flora Lipps, MD      . MEDLINE mouth rinse  15 mL Mouth Rinse q12n4p Ottie Glazier, MD   15 mL at 05/17/19 1600  . midazolam (VERSED) injection 2 mg  2 mg Intravenous Q15 min PRN Bradly Bienenstock, NP   2 mg at 05/13/19 2053  . midazolam (VERSED) injection 2 mg  2 mg Intravenous Q2H PRN Darel Hong D, NP   2 mg at 05/14/19 0113  . multivitamin liquid 15 mL  15 mL Oral Daily Ottie Glazier, MD   15 mL at 05/18/19 0850  . nicotine (NICODERM CQ - dosed in mg/24 hours) patch 21 mg  21 mg Transdermal Daily Ottie Glazier, MD   21 mg at 05/18/19 0850  . ondansetron (ZOFRAN) injection 4 mg  4 mg Intravenous Q6H PRN Tyler Pita, MD      . pantoprazole (PROTONIX) injection 40 mg  40 mg Intravenous QHS Tyler Pita, MD   40 mg at 05/17/19 2247  . PHENObarbital (LUMINAL) injection 30 mg  30 mg Intravenous BID Flora Lipps, MD      . sodium chloride flush (NS) 0.9 % injection 10-40 mL  10-40 mL Intracatheter Q12H Tukov-Yual, Magdalene S, NP   10 mL at 05/17/19 2247  . sodium chloride flush (NS) 0.9 % injection 10-40 mL  10-40 mL Intracatheter PRN Tukov-Yual, Magdalene S, NP         Objective: Vital signs in last 24 hours: Temp:  [98.3 F (36.8 C)-100 F (37.8 C)] 98.8 F (37.1 C) (12/16 0600) Pulse Rate:  [84-128] 90 (12/16 0700) Resp:  [23-30] 23 (12/16 0700) BP: (95-137)/(59-77) 133/77 (12/16 0700) SpO2:  [87 %-100 %] 96 % (12/16 0730) Weight:  [85.5 kg] 85.5 kg (12/16 0500)  Intake/Output from previous day: 12/15 0701 - 12/16 0700 In: 10 [I.V.:10] Out: 1500 [Urine:1500] Intake/Output this shift: No intake/output data recorded.  Physical Exam Vitals and nursing note reviewed.  Constitutional:      General: He is not in acute distress.    Appearance: He is not ill-appearing, toxic-appearing or diaphoretic.  HENT:     Head: Normocephalic and atraumatic.  Pulmonary:     Effort: Pulmonary  effort is normal. No respiratory distress.  Abdominal:     General: There is no distension.     Palpations: Abdomen is soft. There is no mass.     Tenderness: There is abdominal tenderness (Suprapubic). There is no guarding or rebound.     Hernia: No hernia is present.  Skin:    General: Skin is warm and dry.  Neurological:     Mental Status: Mental  status is at baseline.  Psychiatric:        Behavior: Behavior normal.    Lab Results:  Recent Labs    05/17/19 0512 05/18/19 0532  WBC 14.8* 17.3*  HGB 9.4* 9.7*  HCT 29.4* 30.2*  PLT 148* 185   BMET Recent Labs    05/17/19 0512 05/17/19 2118 05/18/19 0532  NA 146*  --  146*  K 3.1* 3.4* 3.4*  CL 111  --  111  CO2 25  --  24  GLUCOSE 115*  --  126*  BUN 49*  --  47*  CREATININE 2.67*  --  2.64*  CALCIUM 7.6*  --  7.7*   Assessment & Plan: 63 year old male with a history of a solitary left kidney now s/p urgent left ureteral stent placement for partially obstructing 5 mm left ureteral stone; also with MRSA bacteremia and sepsis, now resolved; respiratory failure, now resolved; and acute on chronic renal insufficiency, stable.  Discontinue Foley today, okay to place condom cath.  Recommend follow-up at [facility]. Will sign off.  Debroah Loop, PA-C 05/18/2019

## 2019-05-18 NOTE — Evaluation (Signed)
Occupational Therapy Evaluation Patient Details Name: Alan Small MRN: [id number] DOB: 1956/04/30 Today's Date: 05/18/2019    History of Present Illness Per MD notes: Pt is a 63 year old current smoker with a solitary kidney (LEFT) and a PMH that includes: COPD, depression, GERD, hydronephrosis, hypothyroidism, insomnia, MI, paranoid schizophrenia, renal failure, seizures and CVA.  He is post placement of a ureteral stent for a partially obstructing ureteral calculus.  The patient was having symptoms of fatigue, shortness of breath and flank pain on presentation according to the records.  The patient was noted to be tachypneic and tachycardic and obviously appeared septic.  He was getting meropenem through a right IJ Port-A-Cath as an outpatient.  He has a history of ESBL in the past.  Pt intubated on vent during admission now extubated.  MD assessment also includes erythema and swelling to the right back/flank with possible cellulitis, severe sepsis, COPD exacerbation, UTI, and acute on chronic renal failure.   Clinical Impression   Pt seen for OT evaluation this date. Prior to hospital admission, pt was MOD I with fxl mobility with SPC living in group home with ramped entrance..  Currently pt demonstrates impairments in sitting and standing balance and tolerance as well as gross weakness of trunk and limbs requiring MIN A for most aspects of UB self care and MAX A for LB self care at this time.  Pt would benefit from skilled OT to address noted impairments and functional limitations (see below for any additional details) in order to maximize safety and independence while minimizing falls risk and caregiver burden.  Upon hospital discharge, recommend pt discharge to SNF.    Follow Up Recommendations  SNF    Equipment Recommendations  Other (comment)(defer to next level of care)    Recommendations for Other Services       Precautions / Restrictions Precautions Precautions: Fall Precaution  Comments: h/o seizures Restrictions Weight Bearing Restrictions: No Other Position/Activity Restrictions: monitor HR, increase to 115-120 with light activity such as bed rolling, 120-126 with sit<>stand, 109 at rest. BP not orthostatic, but pt with dizziness with position change.      Mobility Bed Mobility Overal bed mobility: Needs Assistance Bed Mobility: Supine to Sit;Sit to Supine Supine to sit: Mod assist Sit to supine: Mod assist  Transfers Overall transfer level: Needs assistance Equipment used: Rolling walker (2 wheeled) Transfers: Sit to/from Stand Sit to Stand: Mod assist;Max assist       General transfer comment: Pt requires foot and knee block, significant arm in arm assistance and MIN/MOD verbal cues for safe hand placement relative to RW.    Balance Overall balance assessment: Needs assistance Sitting-balance support: Single extremity supported Sitting balance-Leahy Scale: Fair Sitting balance - Comments: Pt with min lean on BUEs to maintain static sitting at EOB   Standing balance support: Bilateral upper extremity supported Standing balance-Leahy Scale: Fair Standing balance comment: Pt with increased time required to come to static standing with FWW. Once on his feet Demos F- static standing balance requiring MIN A and significant UE support through FWW througout, tolerates static stand for ~15 seconds.                           ADL either performed or assessed with clinical judgement   ADL Overall ADL's : Needs assistance/impaired Eating/Feeding: Minimal assistance;Sitting Eating/Feeding Details (indicate cue type and reason): requires MIN A d/t weak grasp to take bite of applesauce with spoon. Grooming: Wash/dry hands;Wash/dry  face;Oral care;Minimal assistance;Sitting   Upper Body Bathing: Minimal assistance;Moderate assistance;Sitting   Lower Body Bathing: Moderate assistance;Maximal assistance;Sit to/from stand;+2 for safety/equipment    Upper Body Dressing : Minimal assistance;Sitting   Lower Body Dressing: Moderate assistance;Maximal assistance;Sit to/from stand;+2 for safety/equipment Lower Body Dressing Details (indicate cue type and reason): Pt able to don/doff socks bed level, demos good lower body flexibility, however, requires MOD/MAX A for sit<>stand and requires use of b/l UEs for standing balance so anticipate 2p assist would be needed for clothing mgt over hips at this time. Toilet Transfer: Moderate assistance;Stand-pivot;RW;BSC   Toileting- Clothing Manipulation and Hygiene: Moderate assistance;Maximal assistance;+2 for safety/equipment;Sit to/from stand       Functional mobility during ADLs: (unable this date.)       Vision Patient Visual Report: No change from baseline       Perception     Praxis      Pertinent Vitals/Pain Pain Assessment: No/denies pain     Hand Dominance Right   Extremity/Trunk Assessment Upper Extremity Assessment Upper Extremity Assessment: Generalized weakness   Lower Extremity Assessment Lower Extremity Assessment: Generalized weakness       Communication Communication Communication: No difficulties(some garbled speech)   Cognition Arousal/Alertness: Awake/alert Behavior During Therapy: WFL for tasks assessed/performed Overall Cognitive Status: Within Functional Limits for tasks assessed                                     General Comments       Exercises Other Exercises Other Exercises: OT facilitates education re: role of OT in acute setting. Pt verbalized understanding. Other Exercises: OT facilitates education with pt re: safe hand placement with RW, pt demos understanding.   Shoulder Instructions      Home Living Family/patient expects to be discharged to:: Group home                                 Additional Comments: 1 level home, with ramp access      Prior Functioning/Environment Level of Independence: Needs  assistance  Gait / Transfers Assistance Needed: MOD I for fxl mobiltiy with SPC, reports one fall in last 6 months just prior to this admission with LOB while changing clothes ADL's / Homemaking Assistance Needed: Indep with dressing, assistance with bathing, meals, and meds   Comments: per evalaution from 3 months prior to this admission-pt reported 2-3 falls with blackouts        OT Problem List: Decreased strength;Decreased activity tolerance;Impaired balance (sitting and/or standing);Decreased knowledge of use of DME or AE;Decreased knowledge of precautions;Cardiopulmonary status limiting activity      OT Treatment/Interventions: Self-care/ADL training;Therapeutic exercise;Energy conservation;DME and/or AE instruction;Therapeutic activities;Patient/family education;Balance training    OT Goals(Current goals can be found in the care plan section) Acute Rehab OT Goals Patient Stated Goal: To get stronger OT Goal Formulation: With patient Time For Goal Achievement: 06/01/19 Potential to Achieve Goals: Good  OT Frequency: Min 2X/week   Barriers to D/C:            Co-evaluation              AM-PAC OT "6 Clicks" Daily Activity     Outcome Measure Help from another person eating meals?: A Little Help from another person taking care of personal grooming?: A Little Help from another person toileting, which includes using toliet, bedpan,  or urinal?: A Lot Help from another person bathing (including washing, rinsing, drying)?: A Lot Help from another person to put on and taking off regular upper body clothing?: A Lot Help from another person to put on and taking off regular lower body clothing?: A Lot 6 Click Score: 14   End of Session Equipment Utilized During Treatment: Gait belt;Rolling walker Nurse Communication: Mobility status  Activity Tolerance: Patient tolerated treatment well Patient left: in bed;with call bell/phone within reach;with bed alarm set;with  nursing/sitter in room  OT Visit Diagnosis: Unsteadiness on feet (R26.81);Muscle weakness (generalized) (M62.81)                Time: 5041-3643 OT Time Calculation (min): 38 min Charges:  OT General Charges $OT Visit: 1 Visit OT Evaluation $OT Eval Moderate Complexity: 1 Mod OT Treatments $Self Care/Home Management : 8-22 mins $Therapeutic Activity: 8-22 mins  Gerrianne Scale, MS, OTR/L ascom [phone] 05/18/19, 5:28 PM

## 2019-05-18 NOTE — Progress Notes (Signed)
[facility] SURGICAL PROGRESS NOTE (cpt [phone])  Hospital Day(s): 7.   Post op day(s): 7 Days Post-Op.   Interval History:  Patient seen and examined Overnight converted to atrial flutter/a fib - started on amiodarone, rate more controlled this morning Patient is alert and answers simple questions, no complaints of pain on his right flank.  Leukocytosis worsening (17K), no fevers in last 24 hours Mild electrolyte derangement Renal function elevated, ? Baseline, making good urinae (1.5L)    Review of Systems:  Limited secondary to mental status  Vital signs in last 24 hours: [min-max] current  Temp:  [98.3 F (36.8 C)-100 F (37.8 C)] 98.8 F (37.1 C) (12/16 0600) Pulse Rate:  [84-128] 90 (12/16 0700) Resp:  [23-30] 23 (12/16 0700) BP: (95-137)/(59-77) 133/77 (12/16 0700) SpO2:  [87 %-100 %] 96 % (12/16 0730) Weight:  [85.5 kg] 85.5 kg (12/16 0500)     Height: 6' 0.01" (182.9 cm) Weight: 85.5 kg BMI (Calculated): 25.56   Intake/Output last 2 shifts:  12/15 0701 - 12/16 0700 In: 10 [I.V.:10] Out: 1500 [Urine:1500]   Physical Exam:  Constitutional: alert, cooperative and no distress  HENT: normocephalic without obvious abnormality  Respiratory: breathing non-labored at rest  Cardiovascular: tachycardic and rhythm appear sinus on monitor at time of evaluation  Integumentary: Blanching erythema to the right lateral flank/back, there is appreciable pitting edema, no gross fluctuance or crepitus, this does not appear to tender to the patient.    Labs:  CBC Latest Ref Rng & Units 05/18/2019 05/17/2019 05/14/2019  WBC 4.0 - 10.5 K/uL 17.3(H) 14.8(H) 13.3(H)  Hemoglobin 13.0 - 17.0 g/dL 9.7(L) 9.4(L) 10.3(L)  Hematocrit 39.0 - 52.0 % 30.2(L) 29.4(L) 32.5(L)  Platelets 150 - 400 K/uL 185 148(L) 107(L)   CMP Latest Ref Rng & Units 05/18/2019 05/17/2019 05/17/2019  Glucose 70 - 99 mg/dL 126(H) - 115(H)  BUN 8 - 23 mg/dL 47(H) - 49(H)  Creatinine 0.61 - 1.24  mg/dL 2.64(H) - 2.67(H)  Sodium 135 - 145 mmol/L 146(H) - 146(H)  Potassium 3.5 - 5.1 mmol/L 3.4(L) 3.4(L) 3.1(L)  Chloride 98 - 111 mmol/L 111 - 111  CO2 22 - 32 mmol/L 24 - 25  Calcium 8.9 - 10.3 mg/dL 7.7(L) - 7.6(L)  Total Protein 6.5 - 8.1 g/dL - - -  Total Bilirubin 0.3 - 1.2 mg/dL - - -  Alkaline Phos 38 - 126 U/L - - -  AST 15 - 41 U/L - - -  ALT 0 - 44 U/L - - -     Imaging studies: No new pertinent imaging studies   Assessment/Plan: (ICD-10's: A41.9) 63 y.o. male with erythema and swelling to the right back/flank, which does not appear to have worsened compared to yesterday, unclear etiology, favor cellulitis, no gross fluctuance or clear abscess and no crepitus to suggest necrotizing infection at this time, complicated by multiple comorbid conditions   - Continue IV Abx per ID for now             - No emergent surgical intervention currently; we will closely follow              - May consider repeat imaging if clinical picture/sepsis worsens              - further management per primary and consulting services    All of the above findings and recommendations were discussed with the medical team.  -- Edison Simon, PA-C [facility] 05/18/2019, 7:44 AM [phone] M-F: 7am -  4pm

## 2019-05-19 ENCOUNTER — Inpatient Hospital Stay: Payer: Medicare Other

## 2019-05-19 DIAGNOSIS — N184 Chronic kidney disease, stage 4 (severe): Secondary | ICD-10-CM

## 2019-05-19 DIAGNOSIS — L02211 Cutaneous abscess of abdominal wall: Secondary | ICD-10-CM

## 2019-05-19 DIAGNOSIS — M609 Myositis, unspecified: Secondary | ICD-10-CM

## 2019-05-19 DIAGNOSIS — I484 Atypical atrial flutter: Secondary | ICD-10-CM

## 2019-05-19 DIAGNOSIS — J869 Pyothorax without fistula: Secondary | ICD-10-CM

## 2019-05-19 LAB — CBC WITH DIFFERENTIAL/PLATELET
Abs Immature Granulocytes: 1.91 10*3/uL — ABNORMAL HIGH (ref 0.00–0.07)
Basophils Absolute: 0.1 10*3/uL (ref 0.0–0.1)
Basophils Relative: 1 %
Eosinophils Absolute: 0.2 10*3/uL (ref 0.0–0.5)
Eosinophils Relative: 1 %
HCT: 30.3 % — ABNORMAL LOW (ref 39.0–52.0)
Hemoglobin: 9.7 g/dL — ABNORMAL LOW (ref 13.0–17.0)
Immature Granulocytes: 10 %
Lymphocytes Relative: 6 %
Lymphs Abs: 1.1 10*3/uL (ref 0.7–4.0)
MCH: 28.7 pg (ref 26.0–34.0)
MCHC: 32 g/dL (ref 30.0–36.0)
MCV: 89.6 fL (ref 80.0–100.0)
Monocytes Absolute: 1.3 10*3/uL — ABNORMAL HIGH (ref 0.1–1.0)
Monocytes Relative: 7 %
Neutro Abs: 14.3 10*3/uL — ABNORMAL HIGH (ref 1.7–7.7)
Neutrophils Relative %: 75 %
Platelets: 233 10*3/uL (ref 150–400)
RBC: 3.38 MIL/uL — ABNORMAL LOW (ref 4.22–5.81)
RDW: 14.9 % (ref 11.5–15.5)
Smear Review: NORMAL
WBC: 19 10*3/uL — ABNORMAL HIGH (ref 4.0–10.5)
nRBC: 0 % (ref 0.0–0.2)

## 2019-05-19 LAB — PROTEIN, PLEURAL OR PERITONEAL FLUID: Total protein, fluid: <3 g/dL

## 2019-05-19 LAB — HEPATIC FUNCTION PANEL
ALT: 27 U/L (ref 0–44)
AST: 52 U/L — ABNORMAL HIGH (ref 15–41)
Albumin: 1.6 g/dL — ABNORMAL LOW (ref 3.5–5.0)
Alkaline Phosphatase: 86 U/L (ref 38–126)
Bilirubin, Direct: 2.6 mg/dL — ABNORMAL HIGH (ref 0.0–0.2)
Indirect Bilirubin: 1.4 mg/dL — ABNORMAL HIGH (ref 0.3–0.9)
Total Bilirubin: 4 mg/dL — ABNORMAL HIGH (ref 0.3–1.2)
Total Protein: 5.6 g/dL — ABNORMAL LOW (ref 6.5–8.1)

## 2019-05-19 LAB — URINALYSIS, COMPLETE (UACMP) WITH MICROSCOPIC
Bilirubin Urine: NEGATIVE
Glucose, UA: NEGATIVE mg/dL
Ketones, ur: NEGATIVE mg/dL
Nitrite: NEGATIVE
Protein, ur: NEGATIVE mg/dL
Specific Gravity, Urine: 1.013 (ref 1.005–1.030)
pH: 5 (ref 5.0–8.0)

## 2019-05-19 LAB — BODY FLUID CELL COUNT WITH DIFFERENTIAL
Eos, Fluid: 0 %
Lymphs, Fluid: 9 %
Monocyte-Macrophage-Serous Fluid: 4 %
Neutrophil Count, Fluid: 87 %
Other Cells, Fluid: 0 %
Total Nucleated Cell Count, Fluid: 5726 cu mm

## 2019-05-19 LAB — BASIC METABOLIC PANEL
Anion gap: 9 (ref 5–15)
BUN: 50 mg/dL — ABNORMAL HIGH (ref 8–23)
CO2: 22 mmol/L (ref 22–32)
Calcium: 7.9 mg/dL — ABNORMAL LOW (ref 8.9–10.3)
Chloride: 111 mmol/L (ref 98–111)
Creatinine, Ser: 2.95 mg/dL — ABNORMAL HIGH (ref 0.61–1.24)
GFR calc Af Amer: 25 mL/min — ABNORMAL LOW (ref >60)
GFR calc non Af Amer: 21 mL/min — ABNORMAL LOW (ref >60)
Glucose, Bld: 110 mg/dL — ABNORMAL HIGH (ref 70–99)
Potassium: 4.3 mmol/L (ref 3.5–5.1)
Sodium: 142 mmol/L (ref 135–145)

## 2019-05-19 LAB — GLUCOSE, PLEURAL OR PERITONEAL FLUID: Glucose, Fluid: 40 mg/dL

## 2019-05-19 LAB — OSMOLALITY, URINE: Osmolality, Ur: 417 mOsm/kg (ref 300–900)

## 2019-05-19 LAB — GLUCOSE, CAPILLARY
Glucose-Capillary: 100 mg/dL — ABNORMAL HIGH (ref 70–99)
Glucose-Capillary: 108 mg/dL — ABNORMAL HIGH (ref 70–99)
Glucose-Capillary: 98 mg/dL (ref 70–99)
Glucose-Capillary: 98 mg/dL (ref 70–99)
Glucose-Capillary: 99 mg/dL (ref 70–99)

## 2019-05-19 LAB — CREATININE, URINE, RANDOM: Creatinine, Urine: 91 mg/dL

## 2019-05-19 LAB — CULTURE, BLOOD (ROUTINE X 2): Culture: NO GROWTH

## 2019-05-19 LAB — MAGNESIUM: Magnesium: 2.3 mg/dL (ref 1.7–2.4)

## 2019-05-19 LAB — OSMOLALITY: Osmolality: 309 mOsm/kg — ABNORMAL HIGH (ref 275–295)

## 2019-05-19 LAB — LACTATE DEHYDROGENASE, PLEURAL OR PERITONEAL FLUID: LD, Fluid: 2591 U/L — ABNORMAL HIGH (ref 3–23)

## 2019-05-19 LAB — NA AND K (SODIUM & POTASSIUM), RAND UR
Potassium Urine: 30 mmol/L
Sodium, Ur: 34 mmol/L

## 2019-05-19 MED ORDER — SODIUM CHLORIDE 0.9 % IV SOLN: 700.0000 mg | INTRAVENOUS | Status: DC

## 2019-05-19 MED ORDER — SODIUM CHLORIDE 0.9 % IV SOLN
300.0000 mg | Freq: Two times a day (BID) | INTRAVENOUS | Status: DC
  Administered 2019-05-19 – 2019-05-24 (×10): 300 mg via INTRAVENOUS
  Filled 2019-05-19 (×12): qty 300

## 2019-05-19 MED ORDER — LINEZOLID 600 MG/300ML IV SOLN
600.0000 mg | Freq: Two times a day (BID) | INTRAVENOUS | Status: DC
  Administered 2019-05-19 – 2019-05-24 (×10): 600 mg via INTRAVENOUS
  Filled 2019-05-19 (×16): qty 300

## 2019-05-19 MED ORDER — DEXTROSE-NACL 5-0.45 % IV SOLN: INTRAVENOUS | Status: DC

## 2019-05-19 MED ORDER — LORAZEPAM 2 MG/ML IJ SOLN: 0.5000 mg | Freq: Four times a day (QID) | INTRAMUSCULAR | Status: DC | PRN

## 2019-05-19 NOTE — Progress Notes (Addendum)
ID Pt out of ICU Awake and alert Some sob Had left pleurocentesis today  Patient Vitals for the past 24 hrs:  BP Temp Temp src Pulse Resp SpO2 Height Weight  05/19/19 2019 -- -- -- -- -- 98 % -- --  05/19/19 2005 117/60 98.7 F (37.1 C) Oral 80 20 98 % -- --  05/19/19 1514 (!) 111/56 98.6 F (37 C) -- 82 19 100 % -- --  05/19/19 1326 -- -- -- -- -- 97 % -- --  05/19/19 1202 (!) 101/53 -- -- 90 -- 98 % -- --  05/19/19 1147 (!) 113/57 -- -- 90 -- 99 % -- --  05/19/19 0812 (!) 111/58 98.2 F (36.8 C) -- 82 19 98 % -- --  05/19/19 0801 -- -- -- -- -- 97 % -- --  05/19/19 0257 117/73 98.5 F (36.9 C) Oral 90 18 94 % -- 85.3 kg  05/18/19 2250 118/66 98.3 F (36.8 C) Oral 96 20 97 % 6' (1.829 m) 85.8 kg  05/18/19 2200 120/62 -- -- 92 (!) 23 100 % -- --    Chest b/l air entry decreased bases  [id number] And soft Rt flank fullness and protuberance with induration and erythema CNS non focal   CBC Latest Ref Rng & Units 05/19/2019 05/18/2019 05/17/2019  WBC 4.0 - 10.5 K/uL 19.0(H) 17.3(H) 14.8(H)  Hemoglobin 13.0 - 17.0 g/dL 9.7(L) 9.7(L) 9.4(L)  Hematocrit 39.0 - 52.0 % 30.3(L) 30.2(L) 29.4(L)  Platelets 150 - 400 K/uL 233 185 148(L)    CMP Latest Ref Rng & Units 05/19/2019 05/18/2019 05/17/2019  Glucose 70 - 99 mg/dL 110(H) 126(H) -  BUN 8 - 23 mg/dL 50(H) 47(H) -  Creatinine 0.61 - 1.24 mg/dL 2.95(H) 2.64(H) -  Sodium 135 - 145 mmol/L 142 146(H) -  Potassium 3.5 - 5.1 mmol/L 4.3 3.4(L) 3.4(L)  Chloride 98 - 111 mmol/L 111 111 -  CO2 22 - 32 mmol/L 22 24 -  Calcium 8.9 - 10.3 mg/dL 7.9(L) 7.7(L) -  Total Protein 6.5 - 8.1 g/dL 5.6(L) - -  Total Bilirubin 0.3 - 1.2 mg/dL 4.0(H) - -  Alkaline Phos 38 - 126 U/L 86 - -  AST 15 - 41 U/L 52(H) - -  ALT 0 - 44 U/L 27 - -     Microbiology [facility] 12/9 and 12/12 mRSA 12/13 Pending UC 12/14 VRE  Imaging   Impression/ Recommendation  Extensive MRSA infection with MRSA bacteremia, septic emboli /cavitiation lungs, empyema left,  rt flank muscle abscess, myositis Leucocytosis increasing Will discuss with surgeon and IR to see whetherthe rt flank collection can be drained or is that a phlegmon which cannot be drained. TEE negative Currently on ceftaroline and dapto Will change the latter to linezolid for better lung and soft tissue penetration We need to drain all pockets of pus as antibiotics alone will not cut it  Solitary left kidney Ureteric stone Ureteric stent VRE urine- likely colonizing stone  AKI on ckd- cr fluctuating    History of left hydronephrosis, at one time had percutaneous nephrostomy, stent, complicated by retroperitoneal abscess due to E. coli and Enterococcus and had been treated at [facility] History of ESBL E. coli pyelonephritis and history of Enterococcus faecalis retroperitoneal abscess status post drain placement at [facility] and received nearly 2 months of IV imipenem which he completed mid-November. 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

## 2019-05-19 NOTE — Progress Notes (Signed)
[facility] [place] NAME: Alan Small    MR#:  [id number]  DATE OF BIRTH:  Feb 19, 1956  SUBJECTIVE:  CHIEF COMPLAINT:   Chief Complaint  Patient presents with  . Shortness of Breath   Appears to have shortness of breath.  No nausea no vomiting.  Reports left-sided flank pain.  No fever no chills.  REVIEW OF SYSTEMS:  Review of Systems  Constitutional: Negative for diaphoresis, fever, malaise/fatigue and weight loss.  HENT: Negative for ear discharge, ear pain, hearing loss, nosebleeds, sore throat and tinnitus.   Eyes: Negative for blurred vision and pain.  Respiratory: Negative for cough, hemoptysis, shortness of breath and wheezing.   Cardiovascular: Negative for chest pain, palpitations, orthopnea and leg swelling.  Gastrointestinal: Negative for abdominal pain, blood in stool, constipation, diarrhea, heartburn, nausea and vomiting.  Genitourinary: Negative for dysuria, frequency and urgency.  Musculoskeletal: Negative for back pain and myalgias.  Skin: Negative for itching and rash.  Neurological: Negative for dizziness, tingling, tremors, focal weakness, seizures, weakness and headaches.  Psychiatric/Behavioral: Negative for depression. The patient is not nervous/anxious.     DRUG ALLERGIES:   Allergies  Allergen Reactions  . Mellaril [Thioridazine] Shortness Of Breath and Swelling  . Buprenorphine Itching  . Morphine And Related Itching  . Navane [Thiothixene] Other (See Comments)    Reaction:  GI upset   . Thorazine [Chlorpromazine] Other (See Comments)    Reaction:  Dizziness/fainting    VITALS:  Blood pressure (!) 111/56, pulse 82, temperature 98.6 F (37 C), resp. rate 19, height 6' (1.829 m), weight 85.3 kg, SpO2 100 %. PHYSICAL EXAMINATION:  Physical Exam HENT:     Head: Normocephalic and atraumatic.  Eyes:     Conjunctiva/sclera: Conjunctivae normal.     Pupils: Pupils are equal, round, and reactive to light.  Neck:   Thyroid: No thyromegaly.     Trachea: No tracheal deviation.  Cardiovascular:     Rate and Rhythm: Normal rate and regular rhythm.     Heart sounds: Normal heart sounds.  Pulmonary:     Effort: Pulmonary effort is normal. No respiratory distress.     Breath sounds: Normal breath sounds. No wheezing.  Chest:     Chest wall: No tenderness.  Abdominal:     General: Bowel sounds are normal. There is no distension.     Palpations: Abdomen is soft.     Tenderness: There is no abdominal tenderness.     Comments: fullness rt flank area with some mild erythema  Musculoskeletal:        General: Normal range of motion.     Cervical back: Normal range of motion and neck supple.  Skin:    General: Skin is warm and dry.     Findings: No rash.  Neurological:     Mental Status: He is alert and oriented to person, place, and time.     Cranial Nerves: No cranial nerve deficit.    LABORATORY PANEL:  Male CBC Recent Labs  Lab 05/19/19 0651  WBC 19.0*  HGB 9.7*  HCT 30.3*  PLT 233   ------------------------------------------------------------------------------------------------------------------ Chemistries  Recent Labs  Lab 05/19/19 0651  NA 142  K 4.3  CL 111  CO2 22  GLUCOSE 110*  BUN 50*  CREATININE 2.95*  CALCIUM 7.9*  MG 2.3  AST 52*  ALT 27  ALKPHOS 86  BILITOT 4.0*   RADIOLOGY:  CT ABDOMEN PELVIS WO CONTRAST  Result Date: 05/18/2019 CLINICAL DATA:  Abdominal pain, fever. Solitary left kidney with obstructing ureteral calculus status post ureteral stent placement. EXAM: CT ABDOMEN AND PELVIS WITHOUT CONTRAST TECHNIQUE: Multidetector CT imaging of the abdomen and pelvis was performed following the standard protocol without IV contrast. COMPARISON:  05/15/2019 FINDINGS: Lower chest: Heart is normal size. Moderate left pleural effusion, increasing since prior study. Bilateral lower lobe atelectasis or consolidation. Stranding is again noted within the epicardial fat pad  along the left heart border, stable since recent study. Hepatobiliary: Multiple low-density lesions again seen within the liver, unchanged, likely cysts. Large exophytic cyst off the left hepatic lobe is stable. Gallbladder unremarkable. Pancreas: No focal abnormality or ductal dilatation. Spleen: No focal abnormality.  Normal size. Adrenals/Urinary Tract: Solitary left kidney. Left ureteral stent is in place, unchanged. No hydronephrosis. Calcification in the midpole of the left kidney is unchanged. Urinary bladder is unremarkable. Adrenal glands unremarkable. Stomach/Bowel: Stomach, large and small bowel grossly unremarkable. Vascular/Lymphatic: Aortic atherosclerosis. No enlarged abdominal or pelvic lymph nodes. Reproductive: Extensive prostate calcifications. Other: No free fluid or free air. Musculoskeletal: No acute bony abnormality. Again noted is enlargement of the right oblique abdominal wall musculature with possible low-density fluid collection within the muscle. This is similar to prior study. IMPRESSION: Moderate left pleural effusion, increased since prior study. Bibasilar atelectasis or consolidation/pneumonia. Enlargement of the right oblique abdominal wall musculature with low-density/fluid density within the muscle. This could reflect myositis, muscle injury, or abscess. This is similar to prior study. Left ureteral stent in place. No hydronephrosis or change since prior study. Solitary left kidney. Aortic atherosclerosis. Electronically Signed   By: Rolm Baptise M.D.   On: 05/18/2019 21:23   DG Chest Port 1 View  Result Date: 05/19/2019 CLINICAL DATA:  Post left-sided thoracentesis EXAM: PORTABLE CHEST 1 VIEW COMPARISON:  CT abdomen pelvis-05/18/2019; chest CT-05/15/2019; chest radiograph-05/15/2019 FINDINGS: Grossly unchanged cardiac silhouette and mediastinal contours. Interval reduction in persistent small likely partially loculated left-sided effusion post thoracentesis. No pneumothorax.  Improved aeration of the left lung base with persistent left basilar heterogeneous/consolidative opacities. Cavitary nodules within the left upper lung are grossly unchanged, better demonstrated on chest CT performed 05/15/2019. The right lung remains well aerated. No definite right-sided pleural effusion. No definite evidence of edema. No acute osseous abnormalities. IMPRESSION: 1. Interval reduction in persistent small likely partially loculated left-sided effusion post thoracentesis. No pneumothorax. 2. Improved aeration of the left lung base with persistent left basilar consolidative opacities, atelectasis versus infiltrate. 3. Similar appearance of cavitary nodules with the left upper lung better demonstrated on recent chest CT again worrisome for septic emboli. Electronically Signed   By: Sandi Mariscal M.D.   On: 05/19/2019 12:30   US THORACENTESIS ASP PLEURAL SPACE W/IMG GUIDE  Result Date: 05/19/2019 INDICATION: Symptomatic left sided pleural effusion. Concern for septic pulmonary emboli. Please perform ultrasound-guided thoracentesis for diagnostic and therapeutic purposes. EXAM: US THORACENTESIS ASP PLEURAL SPACE W/IMG GUIDE COMPARISON:  Chest CT-05/15/2019 MEDICATIONS: None. COMPLICATIONS: None immediate. TECHNIQUE: Informed written consent was obtained from the patient after a discussion of the risks, benefits and alternatives to treatment. A timeout was performed prior to the initiation of the procedure. Initial ultrasound scanning demonstrates a small to moderate sized complex/loculated left-sided pleural effusion. The lower chest was prepped and draped in the usual sterile fashion. 1% lidocaine was used for local anesthesia. An ultrasound image was saved for documentation purposes. An 8 Fr Safe-T-Centesis catheter was introduced. The thoracentesis was performed. The catheter was removed and a dressing was applied. The patient tolerated  the procedure well without immediate post procedural  complication. The patient was escorted to have an upright chest radiograph. FINDINGS: A total of approximately 500 cc of slightly brown-colored serous fluid was removed. Requested samples were sent to the laboratory. IMPRESSION: Successful ultrasound-guided left sided thoracentesis yielding 500 cc of slightly brown colored serous pleural fluid. Electronically Signed   By: Sandi Mariscal M.D.   On: 05/19/2019 12:34   ASSESSMENT AND PLAN:  63 y.o. male with history of solitary left kidney complicated by partial obstructing ureteral calculus status post emergency ureteral stent placement, MRSA bacteremia, and COPD admitted initially by PCCM for respiratory failure   Acute respiratory failure due to COPD exacerbation in the setting of Severe sepsis -present on admission -2 L oxygen via nasal cannula now  Severe sepsiswith septic shock -MRSA bacteremia likely from previous central line which is removed TEE is negative for any vegetation Continue Teflaro and daptomycin per ID He currently has a left IJ placed on 05/12/2019 when he was bacteremic.  This line is removed now.   - he does have septic emboli with cavitary lesions bilaterally in his lungs.  So we will need at least 4-6 weeks of antibiotics. So far repeat blood cultures have been negative.  If remains negative for 48 hours PICC line may be placed. 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  The stricture was dilated.  The stent was placed in the left ureter.  The urine culture on 05/11/2019 was negative.  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 fib/flutter with rapid ventricular rate -Currently in sinus tach with  PVCs/PACs -On amnio drip, if able to swallow can start him on Lopressor for rate control cardiology -Anticoagulation not recommended due to history of fall -Echo with normal LV function  Alcohol withdrawal syndrome -Monitor for withdrawal  Acute on chronic renal failure Solitary kidney Avoid nephrotoxins Maintain perfusion,avoid hypotension Volume resuscitate Creatinine 2.64, consider nephro consultation if no improvement  Paranoid schizophrenia Resume home medications as soon as feasible   Hypernatremia/hypokalemia Replete and recheck  DVT prophylaxis:heparin SQ  GI prophylax:PPI      All the records are reviewed and case discussed with Care Management/Social Worker. Management plans discussed with the patient, nursing and they are in agreement.  CODE STATUS: Full Code  TOTAL TIME TAKING CARE OF THIS PATIENT: 35 minutes.   More than 50% of the time was spent in counseling/coordination of care: YES  POSSIBLE D/C IN 3-4 DAYS, DEPENDING ON CLINICAL CONDITION.   Berle Mull M.D on 05/19/2019 at 6:26 PM  After 6pm go to [email or web address] - password [email or web address]  [facility]   CC: Primary care physician; [name], MD  Note: This dictation was prepared with Dragon dictation along with smaller phrase technology. Any transcriptional errors that result from this process are unintentional.

## 2019-05-19 NOTE — H&P (View-Only) (Signed)
[facility] SURGICAL PROGRESS NOTE (cpt [id number])  Hospital Day(s): 8.   Post op day(s): 1 Day Post-Op.   Interval History: Patient seen and examined, no acute events or new complaints overnight. Patient reports that he is doing better. He reports right sided pain with laying on his right side but this is mild. Does not illicit any further complaints. Leukocytosis to 19k this morning (worsening), no fevers in last 24 hours again. Tachycardia improved again, but he does have a worsening of his renal function this morning.   Review of Systems:  Constitutional: denies fever, chills  Respiratory: denies any shortness of breath  Cardiovascular: denies chest pain or palpitations  Musculoskeletal: + right sided pain   Vital signs in last 24 hours: [min-max] current  Temp:  [97.9 F (36.6 C)-99.8 F (37.7 C)] 98.5 F (36.9 C) (12/17 0257) Pulse Rate:  [86-106] 90 (12/17 0257) Resp:  [17-27] 18 (12/17 0257) BP: (99-152)/(49-78) 117/73 (12/17 0257) SpO2:  [92 %-100 %] 94 % (12/17 0257) Weight:  [85.3 kg-85.8 kg] 85.3 kg (12/17 0257)     Height: 6' (182.9 cm) Weight: 85.3 kg BMI (Calculated): 25.5   Intake/Output last 2 shifts:  12/16 0701 - 12/17 0700 In: 1992.9 [I.V.:1217.7; IV Piggyback:775.2] Out: 790 [Urine:790]   Physical Exam:  Constitutional: alert, cooperative and no distress  HENT: normocephalic without obvious abnormality  Eyes: PERRL, EOM's grossly intact and symmetric  Respiratory: breathing non-labored at rest  Integumentary:This morning to my examination, his blanching erythema to the right lateral flank/back appears improved and has regressed compared to outlined portion. This is certainly still edematous and I do appreciate pitting edema, there is no localized appreciable fluctuance or crepitus. This is non-tender. I did add a picture from my examination below.    Right Flank (05/19/2019 11:09 AM):       Labs:  CBC Latest Ref Rng & Units 05/19/2019  05/18/2019 05/17/2019  WBC 4.0 - 10.5 K/uL 19.0(H) 17.3(H) 14.8(H)  Hemoglobin 13.0 - 17.0 g/dL 9.7(L) 9.7(L) 9.4(L)  Hematocrit 39.0 - 52.0 % 30.3(L) 30.2(L) 29.4(L)  Platelets 150 - 400 K/uL 233 185 148(L)   CMP Latest Ref Rng & Units 05/19/2019 05/18/2019 05/17/2019  Glucose 70 - 99 mg/dL 110(H) 126(H) -  BUN 8 - 23 mg/dL 50(H) 47(H) -  Creatinine 0.61 - 1.24 mg/dL 2.95(H) 2.64(H) -  Sodium 135 - 145 mmol/L 142 146(H) -  Potassium 3.5 - 5.1 mmol/L 4.3 3.4(L) 3.4(L)  Chloride 98 - 111 mmol/L 111 111 -  CO2 22 - 32 mmol/L 22 24 -  Calcium 8.9 - 10.3 mg/dL 7.9(L) 7.7(L) -  Total Protein 6.5 - 8.1 g/dL - - -  Total Bilirubin 0.3 - 1.2 mg/dL - - -  Alkaline Phos 38 - 126 U/L - - -  AST 15 - 41 U/L - - -  ALT 0 - 44 U/L - - -     Imaging studies:   CT Abdomen/Pelvis without contrast (05/18/2019) personally reviewed and d/w with Dr Christian Mate which shows right flank fluid collection which remains relatively unchanged in size compared to prior, no subcutaneous gas, there is inflammatory changes to the skin and surrounding subcutaneous tissues, unfortunately study is without IV contrast 2/2 renal function. Radiologist report reviewed below: IMPRESSION: Moderate left pleural effusion, increased since prior study. Bibasilar atelectasis or consolidation/pneumonia.  Enlargement of the right oblique abdominal wall musculature with low-density/fluid density within the muscle. This could reflect myositis, muscle injury, or abscess. This is similar to prior study.  Left ureteral stent in place. No hydronephrosis or change since prior study. Solitary left kidney.  Aortic atherosclerosis.    Assessment/Plan: (ICD-10's: A41.9) 63 y.o. male with right chest wall/flank fluid collection of unclear etiology. This is a challenging situation. His erythema on examination actually appears improved today and I am not able to localize any areas of fluctuance or crepitus however he did have a  worsening leukocytosis but has remained afebrile in the last 24 hours. His repeat imaging, which is limited by lack of contrast, showed relatively stable fluid collection without any subcutaneous emphysema suggestive of necrotizing infection. The leading differentials include cellulitis +/- abscess, myositis, muscle injury, phlegmon.   - Our recommendation at this point would be to consider discussing these imaging findings with IR and see if they feel this fluid collection would be amenable to US guided aspiration to give [place] a better picture of what is going on. Unfortunately, right now there is not a good surgical option and any intervention/exploration would result in an extensive, large, and complex wound which would require care likely beyond what this facility is capable of.   - Continue IV Abx (Ceftaroline / Daptomycin); ID following; appreciate recommednations   - We will continue to closely follow along and continue to reassess need for surgical intervention   - further management per primary and consulting services   All of the above findings and recommendations were discussed with the medical team.  -- Alan Simon, PA-C [facility] 05/19/2019, 7:36 AM [phone] M-F: 7am - 4pm

## 2019-05-19 NOTE — Procedures (Signed)
Pre procedural Dx: Symptomatic Pleural effusion Post procedural Dx: Same  Successful US guided left sided thoracentesis yielding 500 cc of brown colored serous pleural fluid.   Samples sent to lab for analysis.  *Note, pleural effusion was found to be complex and loculated.   EBL: None Complications: None immediate.  Ronny Bacon, MD Pager #: [phone]

## 2019-05-19 NOTE — Care Management Important Message (Signed)
Important Message  Patient Details  Name: ZENO HICKEL MRN: [id number] Date of Birth: 18-Jun-1955   Medicare Important Message Given:  Yes  Verbally reviewed over phone with DSS legal guardian, Levander Campion, at [phone].  Aware of right.  Copy of Medicare IM to be faxed to Resha's attention at [phone].    Dannette Barbara 05/19/2019, 3:42 PM

## 2019-05-19 NOTE — Progress Notes (Signed)
Progress Note  Patient Name: Alan Small Date of Encounter: 05/19/2019  Primary Cardiologist:   Subjective   Sleepy this morning. No chest pain, SOB, or palpitations. Transferred out of the ICU. Renal function slightly worse. HGB stable. No further Afib/flutter.   Inpatient Medications    Scheduled Meds: . chlorhexidine  15 mL Mouth Rinse BID  . Chlorhexidine Gluconate Cloth  6 each Topical Daily  . feeding supplement (ENSURE ENLIVE)  237 mL Oral TID BM  . heparin  5,000 Units Subcutaneous Q8H  . ipratropium-albuterol  3 mL Nebulization TID  . mouth rinse  15 mL Mouth Rinse q12n4p  . metoprolol tartrate  25 mg Oral BID  . multivitamin  15 mL Oral Daily  . nicotine  21 mg Transdermal Daily  . pantoprazole (PROTONIX) IV  40 mg Intravenous QHS  . sodium chloride flush  10-40 mL Intracatheter Q12H   Continuous Infusions: . sodium chloride 10 mL/hr at 05/15/19 1800  . ceFTAROline (TEFLARO) IV 400 mg (05/18/19 2321)  . DAPTOmycin (CUBICIN)  IV 700 mg (05/19/19 0040)  . dextrose 50 mL/hr at 05/18/19 2200   PRN Meds: sodium chloride, acetaminophen, albuterol, LORazepam, midazolam, midazolam, ondansetron (ZOFRAN) IV, sodium chloride flush   Vital Signs    Vitals:   05/18/19 2100 05/18/19 2200 05/18/19 2250 05/19/19 0257  BP: 105/64 120/62 118/66 117/73  Pulse: 93 92 96 90  Resp: (!) 27 (!) 23 20 18   Temp:   98.3 F (36.8 C) 98.5 F (36.9 C)  TempSrc:   Oral Oral  SpO2: 100% 100% 97% 94%  Weight:   85.8 kg 85.3 kg  Height:   6' (1.829 m)     Intake/Output Summary (Last 24 hours) at 05/19/2019 0730 Last data filed at 05/19/2019 0400 Gross per 24 hour  Intake 1992.93 ml  Output 790 ml  Net 1202.93 ml   Filed Weights   05/18/19 0930 05/18/19 2250 05/19/19 0257  Weight: 85.5 kg 85.8 kg 85.3 kg    Telemetry    SR - Personally Reviewed  ECG    No new tracings - Personally Reviewed  Physical Exam   GEN: No acute distress.   Neck: No JVD. Cardiac:  RRR, no murmurs, rubs, or gallops.  Respiratory: Clear to auscultation bilaterally.  GI: Soft, nontender, non-distended.   MS: No edema; No deformity. Neuro:  Alert and oriented x 3; Nonfocal.  Psych: Normal affect.  Labs    Chemistry Recent Labs  Lab 05/14/19 0434 05/15/19 0516 05/15/19 1816 05/17/19 0512 05/17/19 2118 05/18/19 0532  NA 147* 151* 151* 146*  --  146*  K 3.5 4.0  --  3.1* 3.4* 3.4*  CL 112* 117*  --  111  --  111  CO2 27 26  --  25  --  24  GLUCOSE 108* 101*  --  115*  --  126*  BUN 56* 53*  --  49*  --  47*  CREATININE 2.60* 2.26*  --  2.67*  --  2.64*  CALCIUM 8.2* 8.4*  --  7.6*  --  7.7*  ALBUMIN 2.0*  --   --   --   --   --   GFRNONAA 25* 30*  --  24*  --  24*  GFRAA 29* 34*  --  28*  --  28*  ANIONGAP 8 8  --  10  --  11     Hematology Recent Labs  Lab 05/17/19 0512 05/18/19 0532 05/19/19 0102  WBC 14.8* 17.3* 19.0*  RBC 3.26* 3.36* 3.38*  HGB 9.4* 9.7* 9.7*  HCT 29.4* 30.2* 30.3*  MCV 90.2 89.9 89.6  MCH 28.8 28.9 28.7  MCHC 32.0 32.1 32.0  RDW 14.5 14.8 14.9  PLT 148* 185 233    Cardiac EnzymesNo results for input(s): TROPONINI in the last 168 hours. No results for input(s): TROPIPOC in the last 168 hours.   BNPNo results for input(s): BNP, PROBNP in the last 168 hours.   DDimer No results for input(s): DDIMER in the last 168 hours.   Radiology    CT ABDOMEN PELVIS WO CONTRAST  Result Date: 05/18/2019 IMPRESSION: Moderate left pleural effusion, increased since prior study. Bibasilar atelectasis or consolidation/pneumonia. Enlargement of the right oblique abdominal wall musculature with low-density/fluid density within the muscle. This could reflect myositis, muscle injury, or abscess. This is similar to prior study. Left ureteral stent in place. No hydronephrosis or change since prior study. Solitary left kidney. Aortic atherosclerosis. Electronically Signed   By: Rolm Baptise M.D.   On: 05/18/2019 21:23   ECHO TEE  Result Date:  05/18/2019 IMPRESSIONS  1. Left ventricular ejection fraction, by visual estimation, is 60 to 65%. The left ventricle has normal function. There is no left ventricular hypertrophy.  2. Left ventricular diastolic function could not be evaluated.  3. The left ventricle has no regional wall motion abnormalities.  4. No evidence of vegetation/infection noted in any of the valves or myocardium.  5. Global right ventricle has normal systolic function.The right ventricular size is normal. Right vetricular wall thickness was not assessed.  6. Left atrial size was normal.  7. Right atrial size was normal.  8. The mitral valve is degenerative. Trivial mitral valve regurgitation.  9. The tricuspid valve is normal in structure. Tricuspid valve regurgitation is not demonstrated. 10. Aortic valve regurgitation is mild to moderate. 11. The aortic valve is tricuspid. Aortic valve regurgitation is mild to moderate. 12. The pulmonic valve was grossly normal. Pulmonic valve regurgitation is not visualized. FINDINGS  Left Ventricle: Left ventricular ejection fraction, by visual estimation, is 60 to 65%. The left ventricle has normal function. The left ventricle has no regional wall motion abnormalities. The left ventricular internal cavity size was the left ventricle is normal in size. There is no left ventricular hypertrophy. Left ventricular diastolic function could not be evaluated. No evidence of vegetation/infection noted in any of the valves or myocardium. Right Ventricle: The right ventricular size is normal. Right vetricular wall thickness was not assessed. Global RV systolic function is has normal systolic function. Left Atrium: Left atrial size was normal in size. Right Atrium: Right atrial size was normal in size Pericardium: There is no evidence of pericardial effusion. Mitral Valve: The mitral valve is degenerative in appearance. Trivial mitral valve regurgitation. No vegetation noted. Tricuspid Valve: The tricuspid valve  is normal in structure. Tricuspid valve regurgitation is not demonstrated. No vegetation noted. Aortic Valve: The aortic valve is tricuspid. Aortic valve regurgitation is mild to moderate. No vegetation noted. Pulmonic Valve: The pulmonic valve was grossly normal. Pulmonic valve regurgitation is not visualized. No vegetation. Aorta: The aortic root and ascending aorta are structurally normal, with no evidence of dilitation. Venous: The inferior vena cava was not well visualized. Shunts: No atrial level shunt detected by color flow Doppler.  Kate Sable MD Electronically signed by Kate Sable MD [facility] Date/Time: 05/18/2019/1:38:50 PM    Final    [place] EKG SITE RITE  Result Date: 05/18/2019 If [facility]  image not attached, placement could not be confirmed due to current cardiac rhythm.   Cardiac Studies   2D Echo 05/12/2019: 1. Left ventricular ejection fraction, by visual estimation, is 60 to 65%. The left ventricle has normal function. There is no left ventricular hypertrophy. 2. Left ventricular diastolic parameters are consistent with Grade I diastolic dysfunction (impaired relaxation). 3. The left ventricle has no regional wall motion abnormalities. 4. Global right ventricle has normal systolic function.The right ventricular size is normal. No increase in right ventricular wall thickness. 5. Left atrial size was normal. 6. TR signal is inadequate for assessing pulmonary artery systolic pressure. 7. No valve vegetation noted  Patient Profile     63 y.o. male with history of solitary left kidney complicated by partial obstructing ureteral calculus requiring emergency ureteral stent placement, MRSA bacteremia, and COPD, whom we have been asked to see due to bacteremia.  Assessment & Plan    1. MRSA bacteremia: -TEE negative for vegetation   -Continue ABX per ID/CCM  2. Afib/flutter with RVR: -Currently in sinus  -Continue Lopressor 25 mg bid  -CHADS2VASc of 1 -TEE  without LAA thrombus -Patient is a fall risk -There is questionable history of prior stroke, though head CT from 08/2018 showed no evidence of prior infarct -With the above in mind, we will hold off on [place] at this time -Recommend outpatient cardiac monitoring in follow up  3. Chest pain: -No further symptoms -Echo with preserved LVSF as above -HS-Tn not consistent with ACS -Consider outpatient ischemic evaluation  4. Acute om CKD stage III-IV: -Avoid nephrotoxins -Per IM  5. Anemia of chronic disease: -Stable  For questions or updates, please contact [place] Please consult [email or web address] for contact info under Cardiology/STEMI.    Signed, Christell Faith, PA-C [facility] Pager: [phone] 05/19/2019, 7:30 AM

## 2019-05-19 NOTE — Consult Note (Signed)
PHARMACY CONSULT NOTE - FOLLOW UP  Pharmacy Consult for Electrolyte Monitoring and Replacement   Recent Labs: Potassium (mmol/L)  Date Value  05/19/2019 4.3  09/19/2014 3.7   Magnesium (mg/dL)  Date Value  05/19/2019 2.3  12/08/2012 1.8   Calcium (mg/dL)  Date Value  05/19/2019 7.9 (L)   Calcium, Total (mg/dL)  Date Value  09/19/2014 8.9   Albumin (g/dL)  Date Value  05/14/2019 2.0 (L)  09/19/2014 3.5   Phosphorus (mg/dL)  Date Value  05/18/2019 4.0   Sodium (mmol/L)  Date Value  05/19/2019 142  09/19/2014 77   63 year old male admitted with MRSA bacteremia with a past medical history of asthma, COPD, depression, gastric ulcer, GERD, hypothyroid, MI, stroke, schizophrenia, and seizure.  He has a complicated urological history as well, as he only possesses one kidney with emergent stent placement this admission.  Currently extubated, and also on an amiodarone drip for atrial fibrillation. Patient to have TEE today, 12/16.   Goal of Therapy:  All electrolytes wnl  Plan:  No further replacement is needed this morning.  Will obtain BMP with am labs.   Pharmacy will continue to monitor and adjust per consult.   Pearla Dubonnet, RPh  05/19/2019 8:02 AM

## 2019-05-19 NOTE — TOC Progression Note (Signed)
Transition of Care [facility]) - Progression Note    Patient Details  Name: Alan Small MRN: [id number] Date of Birth: 1955-08-04  Transition of Care [facility]) CM/SW [place], LCSW Phone Number: 05/19/2019, 4:38 PM  Clinical Narrative: CSW called patient's legal guardian about PT/OT recommendations for SNF. Patient was recently at [facility] in [place] so she was unsure if he still had days left. CSW spoke with [facility] admissions coordinator who stated patient was there from 9/30-11/14 (about 45 days) so he has about 55 rehab days left. [facility] willing to take him back if they have a bed when he is stable for discharge. Guardian will speak with group home director to see if they want him to go back to [facility] or keep him more local. She will follow up with CSW tomorrow.    Expected Discharge Plan: [place] Barriers to Discharge: Continued Medical Work up  Expected Discharge Plan and Services Expected Discharge Plan: [place] arrangements for the past 2 months: Group Home                                       Social Determinants of Health (SDOH) Interventions    Readmission Risk Interventions Readmission Risk Prevention Plan 05/13/2019  Transportation Screening Complete  Palliative Care Screening Not Applicable  Medication Review (RN Care Manager) Complete  Some recent data might be hidden

## 2019-05-19 NOTE — Progress Notes (Signed)
[facility] SURGICAL PROGRESS NOTE (cpt [phone])  Hospital Day(s): 8.   Post op day(s): 1 Day Post-Op.   Interval History: Patient seen and examined, no acute events or new complaints overnight. Patient reports that he is doing better. He reports right sided pain with laying on his right side but this is mild. Does not illicit any further complaints. Leukocytosis to 19k this morning (worsening), no fevers in last 24 hours again. Tachycardia improved again, but he does have a worsening of his renal function this morning.   Review of Systems:  Constitutional: denies fever, chills  Respiratory: denies any shortness of breath  Cardiovascular: denies chest pain or palpitations  Musculoskeletal: + right sided pain   Vital signs in last 24 hours: [min-max] current  Temp:  [97.9 F (36.6 C)-99.8 F (37.7 C)] 98.5 F (36.9 C) (12/17 0257) Pulse Rate:  [86-106] 90 (12/17 0257) Resp:  [17-27] 18 (12/17 0257) BP: (99-152)/(49-78) 117/73 (12/17 0257) SpO2:  [92 %-100 %] 94 % (12/17 0257) Weight:  [85.3 kg-85.8 kg] 85.3 kg (12/17 0257)     Height: 6' (182.9 cm) Weight: 85.3 kg BMI (Calculated): 25.5   Intake/Output last 2 shifts:  12/16 0701 - 12/17 0700 In: 1992.9 [I.V.:1217.7; IV Piggyback:775.2] Out: 790 [Urine:790]   Physical Exam:  Constitutional: alert, cooperative and no distress  HENT: normocephalic without obvious abnormality  Eyes: PERRL, EOM's grossly intact and symmetric  Respiratory: breathing non-labored at rest  Integumentary:This morning to my examination, his blanching erythema to the right lateral flank/back appears improved and has regressed compared to outlined portion. This is certainly still edematous and I do appreciate pitting edema, there is no localized appreciable fluctuance or crepitus. This is non-tender. I did add a picture from my examination below.    Right Flank (05/19/2019 11:09 AM):       Labs:  CBC Latest Ref Rng & Units 05/19/2019  05/18/2019 05/17/2019  WBC 4.0 - 10.5 K/uL 19.0(H) 17.3(H) 14.8(H)  Hemoglobin 13.0 - 17.0 g/dL 9.7(L) 9.7(L) 9.4(L)  Hematocrit 39.0 - 52.0 % 30.3(L) 30.2(L) 29.4(L)  Platelets 150 - 400 K/uL 233 185 148(L)   CMP Latest Ref Rng & Units 05/19/2019 05/18/2019 05/17/2019  Glucose 70 - 99 mg/dL 110(H) 126(H) -  BUN 8 - 23 mg/dL 50(H) 47(H) -  Creatinine 0.61 - 1.24 mg/dL 2.95(H) 2.64(H) -  Sodium 135 - 145 mmol/L 142 146(H) -  Potassium 3.5 - 5.1 mmol/L 4.3 3.4(L) 3.4(L)  Chloride 98 - 111 mmol/L 111 111 -  CO2 22 - 32 mmol/L 22 24 -  Calcium 8.9 - 10.3 mg/dL 7.9(L) 7.7(L) -  Total Protein 6.5 - 8.1 g/dL - - -  Total Bilirubin 0.3 - 1.2 mg/dL - - -  Alkaline Phos 38 - 126 U/L - - -  AST 15 - 41 U/L - - -  ALT 0 - 44 U/L - - -     Imaging studies:   CT Abdomen/Pelvis without contrast (05/18/2019) personally reviewed and d/w with Dr Christian Mate which shows right flank fluid collection which remains relatively unchanged in size compared to prior, no subcutaneous gas, there is inflammatory changes to the skin and surrounding subcutaneous tissues, unfortunately study is without IV contrast 2/2 renal function. Radiologist report reviewed below: IMPRESSION: Moderate left pleural effusion, increased since prior study. Bibasilar atelectasis or consolidation/pneumonia.  Enlargement of the right oblique abdominal wall musculature with low-density/fluid density within the muscle. This could reflect myositis, muscle injury, or abscess. This is similar to prior study.  Left ureteral stent in place. No hydronephrosis or change since prior study. Solitary left kidney.  Aortic atherosclerosis.    Assessment/Plan: (ICD-10's: A41.9) 63 y.o. male with right chest wall/flank fluid collection of unclear etiology. This is a challenging situation. His erythema on examination actually appears improved today and I am not able to localize any areas of fluctuance or crepitus however he did have a  worsening leukocytosis but has remained afebrile in the last 24 hours. His repeat imaging, which is limited by lack of contrast, showed relatively stable fluid collection without any subcutaneous emphysema suggestive of necrotizing infection. The leading differentials include cellulitis +/- abscess, myositis, muscle injury, phlegmon.   - Our recommendation at this point would be to consider discussing these imaging findings with IR and see if they feel this fluid collection would be amenable to US guided aspiration to give [place] a better picture of what is going on. Unfortunately, right now there is not a good surgical option and any intervention/exploration would result in an extensive, large, and complex wound which would require care likely beyond what this facility is capable of.   - Continue IV Abx (Ceftaroline / Daptomycin); ID following; appreciate recommednations   - We will continue to closely follow along and continue to reassess need for surgical intervention   - further management per primary and consulting services   All of the above findings and recommendations were discussed with the medical team.  -- Edison Simon, PA-C [facility] 05/19/2019, 7:36 AM [phone] M-F: 7am - 4pm

## 2019-05-19 NOTE — Progress Notes (Signed)
Physical Therapy Treatment Patient Details Name: Alan Small MRN: [id number] DOB: [birth year] Today's Date: 05/19/2019    History of Present Illness Per MD notes: Pt is a 63 year old current smoker with a solitary kidney (LEFT) and a PMH that includes: COPD, depression, GERD, hydronephrosis, hypothyroidism, insomnia, MI, paranoid schizophrenia, renal failure, seizures and CVA.  He is post placement of a ureteral stent for a partially obstructing ureteral calculus.  The patient was having symptoms of fatigue, shortness of breath and flank pain on presentation according to the records.  The patient was noted to be tachypneic and tachycardic and obviously appeared septic.  He was getting meropenem through a right IJ Port-A-Cath as an outpatient.  He has a history of ESBL in the past.  Pt intubated on vent during admission now extubated.  MD assessment also includes erythema and swelling to the right back/flank with possible cellulitis, severe sepsis, COPD exacerbation, UTI, and acute on chronic renal failure.    PT Comments    Pt presented with deficits in strength, transfers, mobility, gait, balance, and activity tolerance but made good progress towards goals this session.  Pt required decreased levels of assistance with bed mobility and transfers this session and was able to amb a max of 10' before requiring therapeutic rest break.  HR in the 90s at rest on 4LO2/min and increased to low 100s with amb.  Pt reported no adverse symptoms during the session other than general fatigue.  Pt will benefit from PT services in a SNF setting upon discharge to safely address above deficits for decreased caregiver assistance and eventual return to PLOF.     Follow Up Recommendations  SNF     Equipment Recommendations  Rolling walker with 5" wheels    Recommendations for Other Services       Precautions / Restrictions Precautions Precautions: Fall Precaution Comments: h/o seizures Restrictions Weight  Bearing Restrictions: No Other Position/Activity Restrictions: Watch HR    Mobility  Bed Mobility Overal bed mobility: Modified Independent             General bed mobility comments: Extra time and effort but no physical assistance required this session  Transfers Overall transfer level: Needs assistance Equipment used: Rolling walker (2 wheeled) Transfers: Sit to/from Stand Sit to Stand: Mod assist            Ambulation/Gait Ambulation/Gait assistance: Min assist Gait Distance (Feet): 10 Feet x 1, 5 Feet x 2 Assistive device: Rolling walker (2 wheeled) Gait Pattern/deviations: Trunk flexed;Decreased step length - right;Decreased step length - left;Step-through pattern Gait velocity: decreased   General Gait Details: Short B step length with slow, effortful cadence with min A for stability and to guide RW; mod verbal cues for amb closer to [employer]    Modified Rankin (Stroke Patients Only)       Balance Overall balance assessment: Needs assistance Sitting-balance support: Single extremity supported Sitting balance-Leahy Scale: Fair Sitting balance - Comments: Pt with min lean on BUEs to maintain static sitting at EOB   Standing balance support: Bilateral upper extremity supported;During functional activity Standing balance-Leahy Scale: Poor Standing balance comment: Min A for stabiltiy in standing and during amb                            Cognition Arousal/Alertness: Awake/alert Behavior During Therapy: WFL for tasks assessed/performed Overall Cognitive Status:  Within Functional Limits for tasks assessed                                        Exercises Total Joint Exercises Ankle Circles/Pumps: Strengthening;Both;10 reps;5 reps Quad Sets: Strengthening;Both;10 reps;5 reps Gluteal Sets: Strengthening;Both;10 reps;5 reps Short Arc Quad: Strengthening;Both;10 reps Heel Slides:  AROM;Both;10 reps Hip ABduction/ADduction: Both;10 reps;AROM Straight Leg Raises: Both;10 reps;AROM Long Arc Quad: Strengthening;Both;10 reps Knee Flexion: Strengthening;Both;10 reps    General Comments        Pertinent Vitals/Pain Pain Assessment: No/denies pain    Home Living                      Prior Function            PT Goals (current goals can now be found in the care plan section) Progress towards PT goals: Progressing toward goals    Frequency    Min 2X/week      PT Plan Current plan remains appropriate    Co-evaluation              AM-PAC PT "6 Clicks" Mobility   Outcome Measure  Help needed turning from your back to your side while in a flat bed without using bedrails?: A Little Help needed moving from lying on your back to sitting on the side of a flat bed without using bedrails?: A Little Help needed moving to and from a bed to a chair (including a wheelchair)?: A Lot Help needed standing up from a chair using your arms (e.g., wheelchair or bedside chair)?: A Lot Help needed to walk in hospital room?: A Little Help needed climbing 3-5 steps with a railing? : Total 6 Click Score: 14    End of Session Equipment Utilized During Treatment: Oxygen Activity Tolerance: Patient tolerated treatment well Patient left: in bed;with call bell/phone within reach;with bed alarm set;Other (comment)(Pt declined up in chair) Nurse Communication: Mobility status PT Visit Diagnosis: Difficulty in walking, not elsewhere classified (R26.2);Muscle weakness (generalized) (M62.81)     Time: 1355-1420 PT Time Calculation (min) (ACUTE ONLY): 25 min  Charges:  $Gait Training: 8-22 mins $Therapeutic Exercise: 8-22 mins                     D. Scott Zadyn Yardley PT, DPT 05/19/19, 2:40 PM

## 2019-05-19 NOTE — Consult Note (Signed)
Pharmacy Antibiotic Note  Alan Small is a 63 y.o. male admitted on 05/11/2019 with MRSA bacteremia with suspected source from central line that was present on admission (placed 9/23 at [facility]?) which has since been removed. Patient with respiratory failure on admission and was intubated 12/9-12/11 and is now on 3L [place]. History of solitary left kidney and 12/9 CT significant for partially obstructing 5 mm left ureteral calculus for which he underwent urgent ureteral stent placement. TEE planned. 12/13 CT chest with cavitary lesions in left lung c/f septic emboli and abnormal expansion of the muscles of the lateral aspect of the right abdominal wall c/f intramuscular abscess/infectious myositis.    Patient was admitted at [facility] 9/10-9/30 for pyelonephritis, nephrolithiasis and retroperitoneal hematoma/abscess. Urine culture from that admission with MDR E coli. There was concern for infected stone and appears he was on imipenem/cilastatin 9/21 thru 11/14. He is s/p left PCNL followed by left URS/LL/stent of residual fragments and stent removal at [facility]. Renal calculi culture from 11/3 with <1+ Enterococcus faecium (VRE).   Pharmacy was consulted for daptomycin dosing due to E faecium in urine and new fevers. WBC trending up. Afebrile x24h.  Patient is also on ceftaroline 400 mg q12h for MRSA bacteremia.  Patient underwent thoracentesis 12/17 and pleural fluid sent for analysis. Appears c/f for exudate.   Plan: Daptomycin 700 mg (~8 mg/kg) q48h  Creatinine trending up today. CrCl 27.8 mL/min. With worsening Scr will extend dosing interval to q48h. Baseline CK 41. Patient is not on a statin.   Height: 6' (182.9 cm) Weight: 188 lb 0.8 oz (85.3 kg) IBW/kg (Calculated) : 77.6  Temp (24hrs), Avg:98.7 F (37.1 C), Min:98.2 F (36.8 C), Max:99.8 F (37.7 C)  Recent Labs  Lab 05/13/19 0637 05/14/19 0434 05/15/19 0516 05/17/19 0512 05/18/19 0532 05/19/19 0651  WBC 10.4 13.3*  --  14.8* 17.3* 19.0*   CREATININE 2.98* 2.60* 2.26* 2.67* 2.64* 2.95*    Estimated Creatinine Clearance: 27.8 mL/min (A) (by C-G formula based on SCr of 2.95 mg/dL (H)).    Antimicrobials this admission: meropenem 12/9 >> 12/10 vancomycin 12/10 x 1 Ceftaroline 12/10 >> Daptomycin 12/15 >>  Microbiology results: 12/17 Pleural fluid: pending 12/14 Ucx: >100k colonies/mL E faecium 12/13 Bcx: Ngx4d 12/12 Bcx: 1/4 Staph aureus 12/9 BCx: 4/4 MRSA 12/9 UCx: no growth 12/9 SARS CoV-2: negative  12/9 MRSA PCR: positive  Thank you for allowing pharmacy to be a part of this patient's care.  [place] Resident 05/19/2019 2:53 PM

## 2019-05-20 ENCOUNTER — Encounter: Payer: Self-pay | Admitting: Pulmonary Disease

## 2019-05-20 ENCOUNTER — Inpatient Hospital Stay: Payer: Medicare Other | Admitting: Family

## 2019-05-20 ENCOUNTER — Encounter
Admission: EM | Disposition: A | Discharge status: Other Acute Inpatient Hospital | Payer: Medicare Other | Source: Home / Self Care | Attending: Internal Medicine

## 2019-05-20 DIAGNOSIS — A4902 Methicillin resistant Staphylococcus aureus infection, unspecified site: Secondary | ICD-10-CM

## 2019-05-20 DIAGNOSIS — Z978 Presence of other specified devices: Secondary | ICD-10-CM

## 2019-05-20 DIAGNOSIS — M6008 Infective myositis, other site: Secondary | ICD-10-CM

## 2019-05-20 DIAGNOSIS — M60009 Infective myositis, unspecified site: Secondary | ICD-10-CM

## 2019-05-20 HISTORY — PX: INCISION AND DRAINAGE ABSCESS: SHX5864

## 2019-05-20 LAB — BASIC METABOLIC PANEL
Anion gap: 9 (ref 5–15)
BUN: 60 mg/dL — ABNORMAL HIGH (ref 8–23)
CO2: 19 mmol/L — ABNORMAL LOW (ref 22–32)
Calcium: 7.7 mg/dL — ABNORMAL LOW (ref 8.9–10.3)
Chloride: 111 mmol/L (ref 98–111)
Creatinine, Ser: 3.07 mg/dL — ABNORMAL HIGH (ref 0.61–1.24)
GFR calc Af Amer: 24 mL/min — ABNORMAL LOW (ref >60)
GFR calc non Af Amer: 20 mL/min — ABNORMAL LOW (ref >60)
Glucose, Bld: 93 mg/dL (ref 70–99)
Potassium: 3.8 mmol/L (ref 3.5–5.1)
Sodium: 139 mmol/L (ref 135–145)

## 2019-05-20 LAB — CBC
HCT: 28.2 % — ABNORMAL LOW (ref 39.0–52.0)
Hemoglobin: 9 g/dL — ABNORMAL LOW (ref 13.0–17.0)
MCH: 28.6 pg (ref 26.0–34.0)
MCHC: 31.9 g/dL (ref 30.0–36.0)
MCV: 89.5 fL (ref 80.0–100.0)
Platelets: 282 10*3/uL (ref 150–400)
RBC: 3.15 MIL/uL — ABNORMAL LOW (ref 4.22–5.81)
RDW: 15 % (ref 11.5–15.5)
WBC: 15.9 10*3/uL — ABNORMAL HIGH (ref 4.0–10.5)
nRBC: 0 % (ref 0.0–0.2)

## 2019-05-20 LAB — CULTURE, BLOOD (ROUTINE X 2)
Culture: NO GROWTH
Culture: NO GROWTH
Special Requests: ADEQUATE
Special Requests: ADEQUATE

## 2019-05-20 LAB — RENAL FUNCTION PANEL
Albumin: 1.4 g/dL — ABNORMAL LOW (ref 3.5–5.0)
Anion gap: 9 (ref 5–15)
BUN: 63 mg/dL — ABNORMAL HIGH (ref 8–23)
CO2: 19 mmol/L — ABNORMAL LOW (ref 22–32)
Calcium: 7.4 mg/dL — ABNORMAL LOW (ref 8.9–10.3)
Chloride: 111 mmol/L (ref 98–111)
Creatinine, Ser: 3.08 mg/dL — ABNORMAL HIGH (ref 0.61–1.24)
GFR calc Af Amer: 24 mL/min — ABNORMAL LOW (ref >60)
GFR calc non Af Amer: 20 mL/min — ABNORMAL LOW (ref >60)
Glucose, Bld: 93 mg/dL (ref 70–99)
Phosphorus: 5 mg/dL — ABNORMAL HIGH (ref 2.5–4.6)
Potassium: 3.8 mmol/L (ref 3.5–5.1)
Sodium: 139 mmol/L (ref 135–145)

## 2019-05-20 LAB — PROTEIN, BODY FLUID (OTHER): Total Protein, Body Fluid Other: 2.9 g/dL

## 2019-05-20 LAB — PH, BODY FLUID: pH, Body Fluid: 7.1

## 2019-05-20 LAB — GLUCOSE, CAPILLARY
Glucose-Capillary: 79 mg/dL (ref 70–99)
Glucose-Capillary: 81 mg/dL (ref 70–99)
Glucose-Capillary: 98 mg/dL (ref 70–99)
Glucose-Capillary: 119 mg/dL — ABNORMAL HIGH (ref 70–99)

## 2019-05-20 LAB — CYTOLOGY - NON PAP

## 2019-05-20 LAB — MAGNESIUM: Magnesium: 2.1 mg/dL (ref 1.7–2.4)

## 2019-05-20 LAB — LACTATE DEHYDROGENASE: LDH: 198 U/L — ABNORMAL HIGH (ref 98–192)

## 2019-05-20 LAB — PHOSPHORUS: Phosphorus: 5.1 mg/dL — ABNORMAL HIGH (ref 2.5–4.6)

## 2019-05-20 SURGERY — INCISION AND DRAINAGE, ABSCESS
Anesthesia: General | Site: Flank | Laterality: Right

## 2019-05-20 MED ORDER — BUPIVACAINE LIPOSOME 1.3 % IJ SUSP
INTRAMUSCULAR | Status: AC
  Filled 2019-05-20: qty 20

## 2019-05-20 MED ORDER — PROPOFOL 10 MG/ML IV BOLUS
INTRAVENOUS | Status: AC
  Filled 2019-05-20: qty 20

## 2019-05-20 MED ORDER — EPHEDRINE SULFATE 50 MG/ML IJ SOLN
INTRAMUSCULAR | Status: DC | PRN
  Administered 2019-05-20: 5 mg via INTRAVENOUS

## 2019-05-20 MED ORDER — EPINEPHRINE PF 1 MG/ML IJ SOLN
INTRAMUSCULAR | Status: AC
  Filled 2019-05-20: qty 1

## 2019-05-20 MED ORDER — PROPOFOL 10 MG/ML IV BOLUS
INTRAVENOUS | Status: DC | PRN
  Administered 2019-05-20: 140 mg via INTRAVENOUS

## 2019-05-20 MED ORDER — FENTANYL CITRATE (PF) 100 MCG/2ML IJ SOLN
INTRAMUSCULAR | Status: DC | PRN
  Administered 2019-05-20 (×2): 50 ug via INTRAVENOUS

## 2019-05-20 MED ORDER — BUPIVACAINE LIPOSOME 1.3 % IJ SUSP
INTRAMUSCULAR | Status: DC | PRN
  Administered 2019-05-20: 20 mL

## 2019-05-20 MED ORDER — MIDAZOLAM HCL 2 MG/2ML IJ SOLN
INTRAMUSCULAR | Status: AC
  Filled 2019-05-20: qty 2

## 2019-05-20 MED ORDER — FENTANYL CITRATE (PF) 100 MCG/2ML IJ SOLN
INTRAMUSCULAR | Status: AC
  Filled 2019-05-20: qty 2

## 2019-05-20 MED ORDER — ROCURONIUM BROMIDE 100 MG/10ML IV SOLN
INTRAVENOUS | Status: DC | PRN
  Administered 2019-05-20: 40 mg via INTRAVENOUS

## 2019-05-20 MED ORDER — FENTANYL CITRATE (PF) 100 MCG/2ML IJ SOLN: 25.0000 ug | INTRAMUSCULAR | Status: DC | PRN

## 2019-05-20 MED ORDER — DEXTROSE IN LACTATED RINGERS 5 % IV SOLN: INTRAVENOUS | Status: DC | PRN

## 2019-05-20 MED ORDER — ONDANSETRON HCL 4 MG/2ML IJ SOLN: 4.0000 mg | Freq: Once | INTRAMUSCULAR | Status: DC | PRN

## 2019-05-20 MED ORDER — BUPIVACAINE-EPINEPHRINE 0.25% -1:200000 IJ SOLN
INTRAMUSCULAR | Status: DC | PRN
  Administered 2019-05-20: 30 mL

## 2019-05-20 MED ORDER — SUGAMMADEX SODIUM 200 MG/2ML IV SOLN
INTRAVENOUS | Status: DC | PRN
  Administered 2019-05-20: 100 mg via INTRAVENOUS

## 2019-05-20 MED ORDER — IPRATROPIUM-ALBUTEROL 0.5-2.5 (3) MG/3ML IN SOLN
RESPIRATORY_TRACT | Status: AC
  Filled 2019-05-20: qty 3

## 2019-05-20 MED ORDER — FENTANYL CITRATE (PF) 100 MCG/2ML IJ SOLN
INTRAMUSCULAR | Status: AC
  Administered 2019-05-20: 25 ug via INTRAVENOUS
  Filled 2019-05-20: qty 2

## 2019-05-20 MED ORDER — BUPIVACAINE HCL (PF) 0.25 % IJ SOLN
INTRAMUSCULAR | Status: AC
  Filled 2019-05-20: qty 30

## 2019-05-20 MED ORDER — IPRATROPIUM-ALBUTEROL 0.5-2.5 (3) MG/3ML IN SOLN
3.0000 mL | Freq: Once | RESPIRATORY_TRACT | Status: AC
  Administered 2019-05-20: 3 mL via RESPIRATORY_TRACT

## 2019-05-20 MED ORDER — MIDAZOLAM HCL 2 MG/2ML IJ SOLN
INTRAMUSCULAR | Status: DC | PRN
  Administered 2019-05-20: 1 mg via INTRAVENOUS

## 2019-05-20 SURGICAL SUPPLY — 39 items
BIOPATCH WHT 1IN DISK W/4.0 H (GAUZE/BANDAGES/DRESSINGS) ×1 IMPLANT
BLADE SURG 15 STRL LF DISP TIS (BLADE) ×1 IMPLANT
BLADE SURG 15 STRL SS (BLADE) ×1
BULB RESERV EVAC DRAIN JP 100C (MISCELLANEOUS) ×1 IMPLANT
CANISTER SUCT 3000ML PPV (MISCELLANEOUS) ×2 IMPLANT
CNTNR SPEC 2.5X3XGRAD LEK (MISCELLANEOUS) ×1
CONT SPEC 4OZ STER OR WHT (MISCELLANEOUS) ×1
CONTAINER SPEC 2.5X3XGRAD LEK (MISCELLANEOUS) IMPLANT
COVER WAND RF STERILE (DRAPES) ×2 IMPLANT
DRAIN CHANNEL JP 19F (MISCELLANEOUS) ×1 IMPLANT
DRAIN PENROSE 1/4X12 LTX STRL (WOUND CARE) IMPLANT
DRAIN PENROSE 5/8X18 LTX STRL (DRAIN) IMPLANT
DRAPE LAPAROTOMY 77X122 PED (DRAPES) ×2 IMPLANT
DRSG OPSITE POSTOP 4X8 (GAUZE/BANDAGES/DRESSINGS) ×1 IMPLANT
DRSG TEGADERM 2-3/8X2-3/4 SM (GAUZE/BANDAGES/DRESSINGS) ×1 IMPLANT
ELECT CAUTERY BLADE 6.4 (BLADE) ×2 IMPLANT
ELECT REM PT RETURN 9FT ADLT (ELECTROSURGICAL) ×2
ELECTRODE REM PT RTRN 9FT ADLT (ELECTROSURGICAL) ×1 IMPLANT
GAUZE SPONGE 4X4 12PLY STRL (GAUZE/BANDAGES/DRESSINGS) IMPLANT
GLOVE ORTHO TXT STRL SZ7.5 (GLOVE) ×2 IMPLANT
GOWN STRL REUS W/ TWL LRG LVL3 (GOWN DISPOSABLE) ×2 IMPLANT
GOWN STRL REUS W/TWL LRG LVL3 (GOWN DISPOSABLE) ×2
KIT TURNOVER KIT A (KITS) ×2 IMPLANT
NEEDLE HYPO 22GX1.5 SAFETY (NEEDLE) ×2 IMPLANT
NS IRRIG 1000ML POUR BTL (IV SOLUTION) ×2 IMPLANT
PACK BASIN MINOR ARMC (MISCELLANEOUS) ×2 IMPLANT
PAD ABD DERMACEA PRESS 5X9 (GAUZE/BANDAGES/DRESSINGS) IMPLANT
SOL PREP PVP 2OZ (MISCELLANEOUS) ×2
SOLUTION PREP PVP 2OZ (MISCELLANEOUS) ×1 IMPLANT
SPONGE LAP 18X18 RF (DISPOSABLE) ×2 IMPLANT
STAPLER SKIN PROX 35W (STAPLE) ×1 IMPLANT
SUT ETHILON 3-0 FS-10 30 BLK (SUTURE) ×2
SUT VIC AB 2-0 SH 27 (SUTURE) ×1
SUT VIC AB 2-0 SH 27XBRD (SUTURE) IMPLANT
SUTURE EHLN 3-0 FS-10 30 BLK (SUTURE) IMPLANT
SWAB CULTURE AMIES ANAERIB BLU (MISCELLANEOUS) ×1 IMPLANT
SYR 10ML LL (SYRINGE) ×2 IMPLANT
SYR 20ML LL LF (SYRINGE) ×1 IMPLANT
SYR BULB IRRIG 60ML STRL (SYRINGE) ×2 IMPLANT

## 2019-05-20 NOTE — Anesthesia Post-op Follow-up Note (Signed)
Anesthesia QCDR form completed.        

## 2019-05-20 NOTE — Anesthesia Procedure Notes (Signed)
Procedure Name: Intubation Date/Time: 05/20/2019 10:22 AM Performed by: Gentry Fitz, CRNA Pre-anesthesia Checklist: Patient identified, Emergency Drugs available, Suction available, Patient being monitored and Timeout performed Patient Re-evaluated:Patient Re-evaluated prior to induction Oxygen Delivery Method: Circle system utilized Preoxygenation: Pre-oxygenation with 100% oxygen Induction Type: IV induction and Cricoid Pressure applied Ventilation: Mask ventilation without difficulty Laryngoscope Size: Mac and 4 Grade View: Grade II Tube type: Oral Tube size: 7.5 mm Number of attempts: 1 Placement Confirmation: ETT inserted through vocal cords under direct vision,  positive ETCO2 and breath sounds checked- equal and bilateral Secured at: 22 cm Tube secured with: Tape Dental Injury: Teeth and Oropharynx as per pre-operative assessment

## 2019-05-20 NOTE — Progress Notes (Signed)
   Maintaining sinus rhythm. No new recommendations. Please see note from 05/19/2019.

## 2019-05-20 NOTE — Anesthesia Postprocedure Evaluation (Signed)
Anesthesia Post Note  Patient: Alan Small  Procedure(s) Performed: INCISION AND DRAINAGE ABSCESS, Right flank. (Right Flank)  Patient location during evaluation: PACU Anesthesia Type: General Level of consciousness: awake and alert Pain management: pain level controlled Vital Signs Assessment: post-procedure vital signs reviewed and stable Respiratory status: spontaneous breathing, nonlabored ventilation, respiratory function stable and patient connected to nasal cannula oxygen Cardiovascular status: blood pressure returned to baseline and stable Postop Assessment: no apparent nausea or vomiting Anesthetic complications: no     Last Vitals:  Vitals:   05/20/19 1153 05/20/19 1208  BP: (!) 98/59 (!) 94/59  Pulse: 82 81  Resp: 20 17  Temp:    SpO2: 96% 91%    Last Pain:  Vitals:   05/20/19 1153  TempSrc:   PainSc: [place]

## 2019-05-20 NOTE — Progress Notes (Signed)
Phone consent obtained by 2 RN's.   Levander Campion is case worker [phone] but cannot do consents. Must speak with supervisor Willette Cluster Fanny Dance [phone]) for consents.

## 2019-05-20 NOTE — NC FL2 (Signed)
[place] LEVEL OF CARE SCREENING TOOL     IDENTIFICATION  Patient Name: Alan Small Birthdate: 07-13-1955 Sex: male Admission Date (Current Location): 05/11/2019  [place] and [place] Number:  Engineering geologist and Address:  [facility], [address] [place], [place] [address]      Provider Number: [id number]  Attending Physician Name and Address:  Lavina Hamman, MD  Relative Name and Phone Number:       Current Level of Care: Hospital Recommended Level of Care: [place] Prior Approval Number:    Date Approved/Denied:   PASRR Number: Manual review  Discharge Plan: SNF    Current Diagnoses: Patient Active Problem List   Diagnosis Date Noted  . Atrial flutter ([place])   . Bacteremia   . Community acquired pneumonia 01/20/2019  . Gastroenteritis 01/20/2019  . Blood in stool   . Change in bowel habits   . Constipation   . Polyp of sigmoid colon   . Depression 02/26/2016  . COPD (chronic obstructive pulmonary disease) ([place]) 02/03/2016  . Suprapubic catheter ([place]) 12/17/2015  . Urethral stricture 12/17/2015  . Urinary retention 10/27/2015  . Tobacco use disorder 08/27/2015  . Malnutrition of moderate degree 05/09/2015  . Sepsis ([place]) 05/06/2015  . UTI (lower urinary tract infection) 01/17/2015  . Hypoglycemia 01/17/2015  . Gastroesophageal reflux disease 10/28/2014  . Bright red rectal bleeding 10/26/2014  . Chronic constipation 10/26/2014  . Hydronephrosis of left kidney 01/17/2014  . Neurogenic bladder 06/21/2013  . Urolithiasis 03/15/2013  . Nephrolithiasis 03/04/2013  . Metabolic acidosis 35/57/3220  . Acute renal failure ([place]) 11/03/2012  . Hyperkalemia 11/03/2012  . Pyelonephritis 11/03/2012  . Schizophrenia ([place]) 11/03/2012  . Hypothyroidism 11/03/2012  . Severe sepsis ([place]) 11/03/2012  . Protein-calorie malnutrition, severe ([place]) 11/03/2012  . Encephalopathy acute 11/03/2012  . Chronic  kidney disease, stage IV (severe) ([place]) 07/02/2012  . 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 Kidney stone 04/13/2012    Orientation RESPIRATION BLADDER Height & Weight     Self, Time, Situation, Place  O2(Nasal Canula 2 L) Continent, Indwelling catheter(Has a uretal drain/stent.) Weight: 193 lb 6.4 oz (87.7 kg) Height:  6' (182.9 cm)  BEHAVIORAL SYMPTOMS/MOOD NEUROLOGICAL BOWEL NUTRITION STATUS  (None) (None) Continent Diet(DYS 1)  AMBULATORY STATUS COMMUNICATION OF NEEDS Skin   Limited Assist Verbally Skin abrasions, Bruising, Other (Comment), Surgical wounds(Eczema, Rash.)                       Personal Care Assistance Level of Assistance  Bathing, Feeding, Dressing Bathing Assistance: Limited assistance Feeding assistance: Limited assistance Dressing Assistance: Limited assistance     Functional Limitations Info  Sight, Hearing, Speech Sight Info: Adequate Hearing Info: Adequate Speech Info: Adequate    SPECIAL CARE FACTORS FREQUENCY  OT (By licensed OT), Speech therapy     PT Frequency: 5 x week OT Frequency: 5 x week     Speech Therapy Frequency: 5 x week      Contractures Contractures Info: Not present    Additional Factors Info  Isolation Precautions, Psychotropic Code Status Info: Full code Allergies Info: Mellaril (Thioridazine), Buprenorphine, Morphine And Related, Navane (Thiothixene), Thorazine (Chlorpromazine) Psychotropic Info: Schizophrenia, Depression   Isolation Precautions Info: Contact: ESBL, VRE, MRSA     Current Medications (05/20/2019):  This is the current hospital active medication list Current Facility-Administered Medications  Medication Dose Route Frequency Provider Last Rate Last Admin  . 0.9 %  sodium chloride infusion  Intravenous PRN Ronny Bacon, MD 10 mL/hr at 05/15/19 1800 Rate Verify at 05/15/19 1800  . acetaminophen (TYLENOL) tablet 650 mg  650 mg Oral Q4H PRN Ronny Bacon, MD      . albuterol (PROVENTIL) (2.5 MG/3ML)  0.083% nebulizer solution 2.5 mg  2.5 mg Nebulization Q4H PRN Ronny Bacon, MD      . ceftaroline (TEFLARO) 300 mg in sodium chloride 0.9 % 250 mL IVPB  300 mg Intravenous Q12H Ronny Bacon, MD 0 mL/hr at 05/19/19 2350 300 mg at 05/20/19 1018  . chlorhexidine (PERIDEX) 0.12 % solution 15 mL  15 mL Mouth Rinse BID Ronny Bacon, MD   15 mL at 05/19/19 2144  . Chlorhexidine Gluconate Cloth 2 % PADS 6 each  6 each Topical Daily Ronny Bacon, MD   6 each at 05/20/19 0800  . dextrose 5 %-0.45 % sodium chloride infusion   Intravenous Continuous Ronny Bacon, MD 50 mL/hr at 05/20/19 1448 New Bag at 05/20/19 1448  . feeding supplement (ENSURE ENLIVE) (ENSURE ENLIVE) liquid 237 mL  237 mL Oral TID BM Ronny Bacon, MD   237 mL at 05/20/19 1358  . ipratropium-albuterol (DUONEB) 0.5-2.5 (3) MG/3ML nebulizer solution 3 mL  3 mL Nebulization TID Ronny Bacon, MD   3 mL at 05/20/19 0741  . ipratropium-albuterol (DUONEB) 0.5-2.5 (3) MG/3ML nebulizer solution           . linezolid (ZYVOX) IVPB 600 mg  600 mg Intravenous Q12H Ronny Bacon, MD 0 mL/hr at 05/19/19 2350 600 mg at 05/20/19 1114  . LORazepam (ATIVAN) injection 0.5 mg  0.5 mg Intravenous Q6H PRN Ronny Bacon, MD      . MEDLINE mouth rinse  15 mL Mouth Rinse q12n4p Ronny Bacon, MD   15 mL at 05/19/19 1729  . metoprolol tartrate (LOPRESSOR) tablet 25 mg  25 mg Oral BID Ronny Bacon, MD   25 mg at 05/20/19 0810  . multivitamin liquid 15 mL  15 mL Oral Daily Ronny Bacon, MD   15 mL at 05/19/19 0942  . nicotine (NICODERM CQ - dosed in mg/24 hours) patch 21 mg  21 mg Transdermal Daily Ronny Bacon, MD   21 mg at 05/18/19 0850  . ondansetron (ZOFRAN) injection 4 mg  4 mg Intravenous Q6H PRN Ronny Bacon, MD         Discharge Medications: Please see discharge summary for a list of discharge medications.  Relevant Imaging Results:  Relevant Lab Results:   Additional Information SS#: [id number].  Guardian is [name] at [place]: [phone]. Patient was at [facility] 9/30-11/14 (45 days).  Candie Chroman, LCSW

## 2019-05-20 NOTE — Progress Notes (Signed)
Vallarie Mare, NP about patient's complaints of SOB, lung sound is diminished bilaterally, SpO2 is 97% in 1L. Patient has D5 1/2 at 50 ml/hr infusing, asked if we can discontinue IVF. Patient is eating and drinking ok, NP discontinue IVF. RN will continue to monitor.

## 2019-05-20 NOTE — TOC Progression Note (Signed)
Transition of Care [facility]) - Progression Note    Patient Details  Name: Alan Small MRN: [id number] Date of Birth: Oct 29, 1955  Transition of Care [facility]) CM/SW [place], LCSW Phone Number: 05/20/2019, 2:57 PM  Clinical Narrative: CSW spoke to patient's guardian. They want to place him in a local SNF. First preference is [facility]. PASARR under manual review.    Expected Discharge Plan: [place] Barriers to Discharge: Continued Medical Work up  Expected Discharge Plan and Services Expected Discharge Plan: [place] arrangements for the past 2 months: Group Home                                       Social Determinants of Health (SDOH) Interventions    Readmission Risk Interventions Readmission Risk Prevention Plan 05/13/2019  Transportation Screening Complete  Palliative Care Screening Not Applicable  Medication Review (RN Care Manager) Complete  Some recent data might be hidden

## 2019-05-20 NOTE — Progress Notes (Signed)
ID  Pt had I/D of the large submuscular  abscess cavity on the rt flank today and 400cc of purulent fluid drained-  Doing better  Patient Vitals for the past 24 hrs:  BP Temp Temp src Pulse Resp SpO2 Weight  05/20/19 1646 -- -- -- 82 -- 99 % --  05/20/19 1645 (!) 106/58 98.3 F (36.8 C) Oral -- 19 -- --  05/20/19 1307 (!) 105/57 97.7 F (36.5 C) Oral 76 19 95 % --  05/20/19 1234 (!) 97/57 (!) 97.2 F (36.2 C) -- 78 18 94 % --  05/20/19 1229 93/62 -- -- 79 18 93 % --  05/20/19 1223 (!) 84/60 -- -- 80 19 95 % --  05/20/19 1208 (!) 94/59 -- -- 81 17 91 % --  05/20/19 1153 (!) 98/59 -- -- 82 20 96 % --  05/20/19 1138 (!) 96/56 97.8 F (36.6 C) -- 88 16 95 % --  05/20/19 0928 120/73 (!) 97.5 F (36.4 C) Tympanic 85 (!) 22 99 % --  05/20/19 0805 124/75 98.3 F (36.8 C) Oral 85 19 99 % --  05/20/19 0339 118/60 98.4 F (36.9 C) Oral 78 20 97 % 87.7 kg  05/19/19 2019 -- -- -- -- -- 98 % --  05/19/19 2005 117/60 98.7 F (37.1 C) Oral 80 20 98 % --    Chest b.l air entry HS s1s2 Abd rt side drain present  CBC Latest Ref Rng & Units 05/20/2019 05/19/2019 05/18/2019  WBC 4.0 - 10.5 K/uL 15.9(H) 19.0(H) 17.3(H)  Hemoglobin 13.0 - 17.0 g/dL 9.0(L) 9.7(L) 9.7(L)  Hematocrit 39.0 - 52.0 % 28.2(L) 30.3(L) 30.2(L)  Platelets 150 - 400 K/uL 282 233 185    CMP Latest Ref Rng & Units 05/20/2019 05/20/2019 05/19/2019  Glucose 70 - 99 mg/dL 93 93 110(H)  BUN 8 - 23 mg/dL 60(H) 63(H) 50(H)  Creatinine 0.61 - 1.24 mg/dL 3.07(H) 3.08(H) 2.95(H)  Sodium 135 - 145 mmol/L 139 139 142  Potassium 3.5 - 5.1 mmol/L 3.8 3.8 4.3  Chloride 98 - 111 mmol/L 111 111 111  CO2 22 - 32 mmol/L 19(L) 19(L) 22  Calcium 8.9 - 10.3 mg/dL 7.7(L) 7.4(L) 7.9(L)  Total Protein 6.5 - 8.1 g/dL - - 5.6(L)  Total Bilirubin 0.3 - 1.2 mg/dL - - 4.0(H)  Alkaline Phos 38 - 126 U/L - - 86  AST 15 - 41 U/L - - 52(H)  ALT 0 - 44 U/L - - 27    Micro : blood culture 12/9, 12/12 BC positive 12/13 BC neg 12/14 Urine  VRE  12/17 Pleural fluid Staph  12/18 rt flank abscess  CXR  05/19/19 Interval reduction in persistent small likely partially loculated left-sided effusion post thoracentesis. No pneumothorax.  Impression/recommedation  EXTENSIVE MRSA infection present on admission with MRSA bacteremia, MRSA left empyema, spetic emboli to the lungs with extensive lung infection and Rt flank submuscular abscess All his deep infections are evolving and being discovered and surgically managed. None of the MRSA is  Hospital  Acquired infection. We may have to image his Thoracic and lumbar spine  TEE negative On linezolid and ceftaroline  Solitary left kidney Ureteric stone Ureteric stent VRE urine - due to colonized stone  AKI on CKD- cr fluctating  History of left hydronephrosis, at one time had percutaneous nephrostomy, stent, complicated by retroperitoneal abscess due to E. coli and Enterococcus and had been treated at [facility]. History of ESBL E. coli pyelonephritis and history of Enterococcus faecalis retroperitoneal abscess status  post drain placement at [facility] and received nearly 2 months of IV imipenem which he completed mid-November. H/O VRE in stone culture 04/05/19  History of right nephrectomy  Discussed the management with the patient and the care team  ID will follow him peripherally this weekend- call if needed

## 2019-05-20 NOTE — OR Nursing (Signed)
Patient in OR, lab called with critical value, relayed information to m leamon Firefighter.

## 2019-05-20 NOTE — Consult Note (Signed)
PHARMACY CONSULT NOTE - FOLLOW UP  Pharmacy Consult for Electrolyte Monitoring and Replacement   Recent Labs: Potassium (mmol/L)  Date Value  05/20/2019 3.8  05/20/2019 3.8  09/19/2014 3.7   Magnesium (mg/dL)  Date Value  05/20/2019 2.1  12/08/2012 1.8   Calcium (mg/dL)  Date Value  05/20/2019 7.4 (L)  05/20/2019 7.7 (L)   Calcium, Total (mg/dL)  Date Value  09/19/2014 8.9   Albumin (g/dL)  Date Value  05/20/2019 1.4 (L)  09/19/2014 3.5   Phosphorus (mg/dL)  Date Value  05/20/2019 5.0 (H)  05/20/2019 5.1 (H)   Sodium (mmol/L)  Date Value  05/20/2019 139  05/20/2019 139  09/19/2014 70   63 year old male admitted with MRSA bacteremia with a past medical history of asthma, COPD, depression, gastric ulcer, GERD, hypothyroid, MI, stroke, schizophrenia, and seizure.  He has a complicated urological history as well, as he only possesses one kidney with emergent stent placement this admission.  Currently extubated, and also on an amiodarone drip for atrial fibrillation. Patient to have TEE today, 12/16.   Goal of Therapy:  All electrolytes wnl  Plan:  Per hospitalist, would like to hold off on ordering phosphate binders currently to help lower patient's phosphate level and recheck in the AM  Will obtain BMP with am labs.   Pharmacy will continue to monitor and adjust per consult.   Pearla Dubonnet, RPh  05/20/2019 1:57 PM

## 2019-05-20 NOTE — Progress Notes (Signed)
Pt out for surgery. NPO. Will follow with toleration of diet after Pt is stable for PO intake and diet is started. d

## 2019-05-20 NOTE — OR Nursing (Signed)
Call received with critical lab value for current patient- MD and PA made aware of lab value.

## 2019-05-20 NOTE — Transfer of Care (Signed)
Immediate Anesthesia Transfer of Care Note  Patient: Alan Small  Procedure(s) Performed: INCISION AND DRAINAGE ABSCESS, Right flank. (Right Flank)  Patient Location: PACU  Anesthesia Type:General  Level of Consciousness: drowsy  Airway & Oxygen Therapy: Patient Spontanous Breathing and Patient connected to face mask oxygen  Post-op Assessment: Report given to RN and Post -op Vital signs reviewed and stable  Post vital signs: Reviewed and stable  Last Vitals:  Vitals Value Taken Time  BP 96/56 05/20/19 1138  Temp    Pulse 84 05/20/19 1144  Resp 17 05/20/19 1144  SpO2 95 % 05/20/19 1144  Vitals shown include unvalidated device data.  Last Pain:  Vitals:   05/20/19 0928  TempSrc: Tympanic  PainSc: 9       Patients Stated Pain Goal: 3 (65/79/03 8333)  Complications: No apparent anesthesia complications

## 2019-05-20 NOTE — Anesthesia Preprocedure Evaluation (Signed)
Anesthesia Evaluation  Patient identified by MRN, date of birth, ID band Patient awake    Reviewed: Allergy & Precautions, H&P , NPO status , Patient's Chart, lab work & pertinent test results, reviewed documented beta blocker date and time   Airway Mallampati: II  TM Distance: >3 FB Neck ROM: full    Dental  (+) Edentulous Lower, Edentulous Upper   Pulmonary asthma , pneumonia, COPD, Current Smoker,    Pulmonary exam normal        Cardiovascular Exercise Tolerance: Poor + Past MI  Normal cardiovascular exam Rhythm:regular Rate:Normal     Neuro/Psych Seizures -,  PSYCHIATRIC DISORDERS Depression Schizophrenia  Neuromuscular disease CVA    GI/Hepatic Neg liver ROS, PUD, GERD  Medicated,  Endo/Other  Hypothyroidism   Renal/GU negative Renal ROS  negative genitourinary   Musculoskeletal   Abdominal   Peds  Hematology negative hematology ROS (+)   Anesthesia Other Findings Past Medical History: No date: Asthma No date: COPD (chronic obstructive pulmonary disease) (HCC) No date: Depression No date: External hemorrhoids No date: Gastric ulcer No date: GERD (gastroesophageal reflux disease) No date: Hydronephrosis No date: Hypothyroidism     Comment:  hypo No date: Insomnia No date: Mental disorder No date: Myocardial infarction [facility]) No date: Paranoid schizophrenia ([place]) No date: Renal disorder     Comment:  renal failure No date: Seizures ([place]) No date: Stroke [facility]) Past Surgical History: No date: APPENDECTOMY 12/07/2014: COLONOSCOPY WITH PROPOFOL; N/A     Comment:  Procedure: COLONOSCOPY WITH PROPOFOL;  Surgeon: Manya Silvas, MD;  Location: [place];  Service:               Endoscopy;  Laterality: N/A; 10/10/2016: COLONOSCOPY WITH PROPOFOL; N/A     Comment:  Procedure: COLONOSCOPY WITH PROPOFOL;  Surgeon: Lucilla Lame, MD;  Location: [place];  Service:                Endoscopy;  Laterality: N/A; 01/21/2019: CYSTOSCOPY W/ URETERAL STENT PLACEMENT; Left     Comment:  Procedure: CYSTOSCOPY WITH RETROGRADE PYELOGRAM/URETERAL              STENT PLACEMENT;  Surgeon: Abbie Sons, MD;                Location: [facility];  Service: Urology;  Laterality: Left; 05/11/2019: CYSTOSCOPY WITH URETEROSCOPY AND STENT PLACEMENT; Left     Comment:  Procedure: CYSTOSCOPY WITH URETEROSCOPY AND STENT               PLACEMENT, LEFT;  Surgeon: Hollice Espy, MD;                Location: [facility];  Service: Urology;  Laterality: Left; 12/07/2014: ESOPHAGOGASTRODUODENOSCOPY; N/A     Comment:  Procedure: ESOPHAGOGASTRODUODENOSCOPY (EGD);  Surgeon:               Manya Silvas, MD;  Location: [facility] END[facility]COPY;                Service: Endoscopy;  Laterality: N/A; No date: NEPHRECTOMY 10/10/2016: POLYPECTOMY     Comment:  Procedure: POLYPECTOMY;  Surgeon: Lucilla Lame, MD;                Location: [place];  Service: Endoscopy;; No date: SUPRAPUBIC CATHETER INSERTION 05/18/2019: TEE WITHOUT CARDIOVERSION; N/A  Comment:  Procedure: TRANSESOPHAGEAL ECHOCARDIOGRAM (TEE);                Surgeon: Nelva Bush, MD;  Location: [facility];                Service: Cardiovascular;  Laterality: N/A; BMI    Body Mass Index: 26.23 kg/m     Reproductive/Obstetrics negative OB ROS                             Anesthesia Physical Anesthesia Plan  ASA: IV  Anesthesia Plan: General ETT   Post-op Pain Management:    Induction:   PONV Risk Score and Plan:   Airway Management Planned:   Additional Equipment:   Intra-op Plan:   Post-operative Plan:   Informed Consent: I have reviewed the patients History and Physical, chart, labs and discussed the procedure including the risks, benefits and alternatives for the proposed anesthesia with the patient or authorized representative who has indicated his/her understanding and acceptance.      Dental Advisory Given  Plan Discussed with: CRNA  Anesthesia Plan Comments:         Anesthesia Quick Evaluation

## 2019-05-20 NOTE — Consult Note (Signed)
Pharmacy Antibiotic Note  Alan Small is a 63 y.o. male admitted on 05/11/2019 with MRSA bacteremia with suspected source from central line that was present on admission (placed 9/23 at [facility]?) which has since been removed. Patient with respiratory failure on admission and was intubated 12/9-12/11 and is now on 3L [place]. History of solitary left kidney and 12/9 CT significant for partially obstructing 5 mm left ureteral calculus for which he underwent urgent ureteral stent placement. TEE planned. 12/13 CT chest with cavitary lesions in left lung c/f septic emboli and abnormal expansion of the muscles of the lateral aspect of the right abdominal wall c/f intramuscular abscess/infectious myositis.    Patient was admitted at [facility] 9/10-9/30 for pyelonephritis, nephrolithiasis and retroperitoneal hematoma/abscess. Urine culture from that admission with MDR E coli. There was concern for infected stone and appears he was on imipenem/cilastatin 9/21 thru 11/14. He is s/p left PCNL followed by left URS/LL/stent of residual fragments and stent removal at [facility]. Renal calculi culture from 11/3 with <1+ Enterococcus faecium (VRE).   Pharmacy was consulted for daptomycin dosing due to E faecium in urine and new fevers. WBC trending up. Afebrile x24h.  Patient is also on ceftaroline 400 mg q12h for MRSA bacteremia.  Patient underwent thoracentesis 12/17 and pleural fluid sent for analysis. Appears c/f for exudate.   Plan: Changing Daptomycin to Linezolid for better lung and soft tissue penetration.   Linezolid 600mg  IV Q12 hours.  Creatinine trending up today. CrCl 26.6 mL/min. Will continue to monitor.  Height: 6' (182.9 cm) Weight: 193 lb 6.4 oz (87.7 kg) IBW/kg (Calculated) : 77.6  Temp (24hrs), Avg:98 F (36.7 C), Min:97.2 F (36.2 C), Max:98.7 F (37.1 C)  Recent Labs  Lab 05/14/19 0434 05/15/19 0516 05/17/19 0512 05/18/19 0532 05/19/19 0651 05/20/19 0741  WBC 13.3*  --  14.8* 17.3* 19.0* 15.9*   CREATININE 2.60* 2.26* 2.67* 2.64* 2.95* 3.07*  3.08*    Estimated Creatinine Clearance: 26.6 mL/min (A) (by C-G formula based on SCr of 3.08 mg/dL (H)).    Antimicrobials this admission: meropenem 12/9 >> 12/10 vancomycin 12/10 x 1 Ceftaroline 12/10 >> Daptomycin 12/15 >>  Microbiology results: 12/17 Pleural fluid: pending 12/14 Ucx: >100k colonies/mL E faecium 12/13 Bcx: Ngx4d 12/12 Bcx: 1/4 Staph aureus 12/9 BCx: 4/4 MRSA 12/9 UCx: no growth 12/9 SARS CoV-2: negative  12/9 MRSA PCR: positive  Thank you for allowing pharmacy to be a part of this patient's care.  [place] Resident 05/20/2019 3:10 PM

## 2019-05-20 NOTE — Interval H&P Note (Signed)
Consent discussion/Interval Note:  05/20/2019 9:56 AM  Alan Small  has presented today for surgery, with the diagnosis of right flank abscess.  The various methods of treatment have been discussed with the patient and family. After consideration of risks, benefits and other options for treatment, the patient has consented to  Procedure(s): INCISION AND DRAINAGE ABSCESS, Right flank. (Right) as a surgical intervention.  We explored options of interventional radiology for percutaneous drainage or aspiration.  That is felt that there may be more of a phlegmon/double evolving abscess present and would likely require more aggressive drainage/debridement if indeed we find what we anticipate.  Patient is aware that we will leave a large wound as needed for appropriate wound care and alleviation of any other potential infectious process identified.  Questions answered, consent obtained from Education officer, museum.  No guarantees ever expressed or implied. The patient's history has been reviewed, patient examined, no change in status, stable for surgery.  I have reviewed the patient's chart and labs.  Questions were answered to the patient's satisfaction.     Ronny Bacon

## 2019-05-20 NOTE — Op Note (Signed)
Incision and drainage right lower quadrant abdominal intrafascial abscess.  Pre-operative Diagnosis: Purulent myositis with abscess.  Post-operative Diagnosis: same.    Surgeon: Ronny Bacon, M.D., [facility]  Anesthesia: General endotracheal   Findings:  Large right lower quadrant anterior submuscular abscess cavity with cloudy seropurulent fluid large volume estimated at 400+ mL  Estimated Blood Loss: 25 mL          Specimens: Fluid for culture and sensitivity/repeat Gram stain.          Complications: none              Procedure Details  The patient was seen again in the Holding Room. The benefits, complications, treatment options, and expected outcomes were discussed with the patient. The risks of bleeding, infection, recurrence of symptoms, failure to resolve symptoms, unanticipated injury, prosthetic placement, prosthetic infection, any of which could require further surgery were reviewed with the patient. The likelihood of improving the patient's symptoms with return to their baseline status is reasonable.  The patient and/or family concurred with the proposed plan, giving informed consent.  The patient was taken to Operating Room, identified and the procedure verified.    Prior to the induction of general anesthesia, antibiotic prophylaxis was administered. VTE prophylaxis was in place.  General endotracheal anesthesia was then administered and tolerated well. After the induction, the patient was positioned in the left lateral decubitus position with axillary roll, arm boards and beanbag and the right flank was prepped with Chloraprep and draped in the sterile fashion.   A Time Out was held and the above information confirmed. Identifying the most indurated area, that was also most prominent I utilized an 18-gauge spinal needle and using a tangential course attempted aspiration.  However most of the perceived fluid in this region was now more anterior medial.  Made incision over the  most indurated area, carried it down to the external oblique fascia which was then next incised and the muscle identified.  I found a posterior plane by splitting the muscle and was able to find a ready dissection medially where I found the large cavity of seropurulent fluid.  I then extended the incision medially and anteriorly and was able to aspirate the entire contents of the cavity, debride the cavity with dry sponges to remove the fibrinous exudate that was adherent to the perimeter.  We then irrigated the cavity with multiple aliquots of sterile saline and aspirated.  We then confirmed hemostasis.  I took a 27 Blake drain in passed it through a superior lateral stab site.  It was secured at the skin with 3-0 nylon the entire length of the drain was then placed within the cavity.  Then reapproximated the external oblique aponeurosis/fascia with running 2-0 Vicryl.  Then irrigated further subcutaneous tissues ensuring adequate hemostasis.  We then closed the skin with staples. Patient appeared to tolerate the procedure well.      Ronny Bacon M.D., [facility] 05/20/2019 11:23 AM

## 2019-05-20 NOTE — Progress Notes (Signed)
[place] [facility] [place] NAME: Alan Small    MR#:  [id number]  DATE OF BIRTH:  [birth year]  SUBJECTIVE:  CHIEF COMPLAINT:   Chief Complaint  Patient presents with  . Shortness of Breath   Somewhat improved.  No nausea no vomiting.  No fever no chills.  No chest pain abdominal pain.  No diarrhea no constipation.  REVIEW OF SYSTEMS:  Review of Systems  Constitutional: Negative for diaphoresis, fever, malaise/fatigue and weight loss.  HENT: Negative for ear discharge, ear pain, hearing loss, nosebleeds, sore throat and tinnitus.   Eyes: Negative for blurred vision and pain.  Respiratory: Negative for cough, hemoptysis, shortness of breath and wheezing.   Cardiovascular: Negative for chest pain, palpitations, orthopnea and leg swelling.  Gastrointestinal: Negative for abdominal pain, blood in stool, constipation, diarrhea, heartburn, nausea and vomiting.  Genitourinary: Negative for dysuria, frequency and urgency.  Musculoskeletal: Negative for back pain and myalgias.  Skin: Negative for itching and rash.  Neurological: Negative for dizziness, tingling, tremors, focal weakness, seizures, weakness and headaches.  Psychiatric/Behavioral: Negative for depression. The patient is not nervous/anxious.     DRUG ALLERGIES:   Allergies  Allergen Reactions  . Mellaril [Thioridazine] Shortness Of Breath and Swelling  . Buprenorphine Itching  . Morphine And Related Itching  . Navane [Thiothixene] Other (See Comments)    Reaction:  GI upset   . Thorazine [Chlorpromazine] Other (See Comments)    Reaction:  Dizziness/fainting    VITALS:  Blood pressure (!) 106/58, pulse 82, temperature 98.3 F (36.8 C), temperature source Oral, resp. rate 19, height 6' (1.829 m), weight 87.7 kg, SpO2 99 %. PHYSICAL EXAMINATION:  Physical Exam HENT:     Head: Normocephalic and atraumatic.  Eyes:     Conjunctiva/sclera: Conjunctivae normal.     Pupils: Pupils are equal, round,  and reactive to light.  Neck:     Thyroid: No thyromegaly.     Trachea: No tracheal deviation.  Cardiovascular:     Rate and Rhythm: Normal rate and regular rhythm.     Heart sounds: Normal heart sounds.  Pulmonary:     Effort: Pulmonary effort is normal. No respiratory distress.     Breath sounds: Normal breath sounds. No wheezing.  Chest:     Chest wall: No tenderness.  Abdominal:     General: Bowel sounds are normal. There is no distension.     Palpations: Abdomen is soft.     Tenderness: There is no abdominal tenderness.     Comments: fullness rt flank area with some mild erythema  Musculoskeletal:        General: Normal range of motion.     Cervical back: Normal range of motion and neck supple.  Skin:    General: Skin is warm and dry.     Findings: No rash.  Neurological:     Mental Status: He is alert and oriented to person, place, and time.     Cranial Nerves: No cranial nerve deficit.    LABORATORY PANEL:  Male CBC Recent Labs  Lab 05/20/19 0741  WBC 15.9*  HGB 9.0*  HCT 28.2*  PLT 282   ------------------------------------------------------------------------------------------------------------------ Chemistries  Recent Labs  Lab 05/19/19 0651 05/20/19 0741  NA 142 139  139  K 4.3 3.8  3.8  CL 111 111  111  CO2 22 19*  19*  GLUCOSE 110* 93  93  BUN 50* 60*  63*  CREATININE 2.95* 3.07*  3.08*  CALCIUM 7.9*  7.7*  7.4*  MG 2.3 2.1  AST 52*  --   ALT 27  --   ALKPHOS 86  --   BILITOT 4.0*  --    RADIOLOGY:  No results found. ASSESSMENT AND PLAN:  63 y.o. male with history of solitary left kidney complicated by partial obstructing ureteral calculus status post emergency ureteral stent placement, MRSA bacteremia, and COPD admitted initially by PCCM for respiratory failure   Acute respiratory failure due to COPD exacerbation in the setting of Severe sepsis -present on admission -2 L oxygen via nasal cannula now  Severe sepsiswith septic  shock -MRSA bacteremia likely from previous central line which is removed TEE is negative for any vegetation Continue Teflaro and Zyvox per ID Patient had left IJ placed on 05/12/2019 when he was bacteremic.  This line is removed now.   - he does have septic emboli with cavitary lesions bilaterally in his lungs.  So we will need at least 4-6 weeks of antibiotics. So far repeat blood cultures have been negative.  If remains negative for 48 hours PICC line may be placed. -Surgery following for possible evaluation of phlegmon, pyomyositis, of the rt flank area SP drain placement.  Appreciate assistance.  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  The stricture was dilated.  The stent was placed in the left ureter.  The urine culture on 05/11/2019 was negative.  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 fib/flutter with rapid ventricular rate -Currently in sinus tach with PVCs/PACs -On amnio drip, if able to swallow can start him on Lopressor for rate control cardiology -Anticoagulation not recommended due to history of fall -Echo with normal LV function  Alcohol withdrawal syndrome -Monitor for withdrawal  Acute on chronic renal failure Solitary kidney Avoid nephrotoxins Maintain perfusion,avoid hypotension  Paranoid schizophrenia Resume home medications as soon as feasible   Hypernatremia/hypokalemia Replete and recheck  DVT prophylaxis:heparin SQ  GI prophylax:PPI      All the records are reviewed and case discussed with Care Management/Social Worker. Management plans discussed with the patient, nursing and they are in agreement.  CODE STATUS: Full Code  TOTAL TIME TAKING CARE OF THIS PATIENT: 35 minutes.   More than 50% of the time was  spent in counseling/coordination of care: YES  POSSIBLE D/C IN 3-4 DAYS, DEPENDING ON CLINICAL CONDITION.   Berle Mull M.D on 05/20/2019 at 6:58 PM  After 6pm go to [email or web address] - password [email or web address]  [facility]   CC: Primary care physician; [name], MD  Note: This dictation was prepared with Dragon dictation along with smaller phrase technology. Any transcriptional errors that result from this process are unintentional.

## 2019-05-21 ENCOUNTER — Inpatient Hospital Stay: Payer: Medicare Other

## 2019-05-21 LAB — RENAL FUNCTION PANEL
Albumin: 1.4 g/dL — ABNORMAL LOW (ref 3.5–5.0)
Anion gap: 9 (ref 5–15)
BUN: 64 mg/dL — ABNORMAL HIGH (ref 8–23)
CO2: 20 mmol/L — ABNORMAL LOW (ref 22–32)
Calcium: 7.6 mg/dL — ABNORMAL LOW (ref 8.9–10.3)
Chloride: 110 mmol/L (ref 98–111)
Creatinine, Ser: 3.24 mg/dL — ABNORMAL HIGH (ref 0.61–1.24)
GFR calc Af Amer: 22 mL/min — ABNORMAL LOW (ref >60)
GFR calc non Af Amer: 19 mL/min — ABNORMAL LOW (ref >60)
Glucose, Bld: 90 mg/dL (ref 70–99)
Phosphorus: 5.7 mg/dL — ABNORMAL HIGH (ref 2.5–4.6)
Potassium: 4.4 mmol/L (ref 3.5–5.1)
Sodium: 139 mmol/L (ref 135–145)

## 2019-05-21 LAB — CBC
HCT: 27.3 % — ABNORMAL LOW (ref 39.0–52.0)
Hemoglobin: 8.9 g/dL — ABNORMAL LOW (ref 13.0–17.0)
MCH: 28.9 pg (ref 26.0–34.0)
MCHC: 32.6 g/dL (ref 30.0–36.0)
MCV: 88.6 fL (ref 80.0–100.0)
Platelets: 327 10*3/uL (ref 150–400)
RBC: 3.08 MIL/uL — ABNORMAL LOW (ref 4.22–5.81)
RDW: 15.1 % (ref 11.5–15.5)
WBC: 16.5 10*3/uL — ABNORMAL HIGH (ref 4.0–10.5)
nRBC: 0 % (ref 0.0–0.2)

## 2019-05-21 MED ORDER — OXYCODONE-ACETAMINOPHEN 5-325 MG PO TABS
1.0000 | ORAL_TABLET | ORAL | Status: DC | PRN
  Administered 2019-05-21 – 2019-05-24 (×8): 1 via ORAL
  Filled 2019-05-21 (×8): qty 1

## 2019-05-21 NOTE — Progress Notes (Signed)
[facility] [place] NAME: Alan Small    MR#:  [id number]  DATE OF BIRTH:  Oct 22, 1955  SUBJECTIVE:  CHIEF COMPLAINT:   Chief Complaint  Patient presents with  . Shortness of Breath   Patient denies any acute complaint breathing okay.  No nausea no vomiting.  Overnight had some increased breathing issues.  REVIEW OF SYSTEMS:  Review of Systems  Constitutional: Negative for diaphoresis, fever, malaise/fatigue and weight loss.  HENT: Negative for ear discharge, ear pain, hearing loss, nosebleeds, sore throat and tinnitus.   Eyes: Negative for blurred vision and pain.  Respiratory: Negative for cough, hemoptysis, shortness of breath and wheezing.   Cardiovascular: Negative for chest pain, palpitations, orthopnea and leg swelling.  Gastrointestinal: Negative for abdominal pain, blood in stool, constipation, diarrhea, heartburn, nausea and vomiting.  Genitourinary: Negative for dysuria, frequency and urgency.  Musculoskeletal: Negative for back pain and myalgias.  Skin: Negative for itching and rash.  Neurological: Negative for dizziness, tingling, tremors, focal weakness, seizures, weakness and headaches.  Psychiatric/Behavioral: Negative for depression. The patient is not nervous/anxious.     DRUG ALLERGIES:   Allergies  Allergen Reactions  . Mellaril [Thioridazine] Shortness Of Breath and Swelling  . Buprenorphine Itching  . Morphine And Related Itching  . Navane [Thiothixene] Other (See Comments)    Reaction:  GI upset   . Thorazine [Chlorpromazine] Other (See Comments)    Reaction:  Dizziness/fainting    VITALS:  Blood pressure 110/61, pulse 83, temperature 97.9 F (36.6 C), resp. rate 18, height 6' (1.829 m), weight 89.5 kg, SpO2 96 %. PHYSICAL EXAMINATION:  Physical Exam Constitutional:      General: He is in acute distress.  HENT:     Head: Normocephalic and atraumatic.  Eyes:     Conjunctiva/sclera: Conjunctivae normal.   Pupils: Pupils are equal, round, and reactive to light.  Neck:     Thyroid: No thyromegaly.     Trachea: No tracheal deviation.  Cardiovascular:     Rate and Rhythm: Normal rate and regular rhythm.     Heart sounds: Normal heart sounds.  Pulmonary:     Effort: Tachypnea and respiratory distress present.     Breath sounds: Decreased breath sounds and rales present. No wheezing or rhonchi.  Chest:     Chest wall: No tenderness.  Abdominal:     General: Bowel sounds are normal. There is no distension.     Palpations: Abdomen is soft.     Tenderness: There is abdominal tenderness. There is no guarding or rebound.     Comments: fullness rt flank area with some mild erythema  Musculoskeletal:        General: Normal range of motion.     Cervical back: Normal range of motion and neck supple.  Skin:    General: Skin is warm and dry.     Findings: No rash.  Neurological:     General: No focal deficit present.     Mental Status: He is alert and oriented to person, place, and time.     Motor: No weakness.    LABORATORY PANEL:  Male CBC Recent Labs  Lab 05/21/19 0508  WBC 16.5*  HGB 8.9*  HCT 27.3*  PLT 327   ------------------------------------------------------------------------------------------------------------------ Chemistries  Recent Labs  Lab 05/19/19 0651 05/20/19 0741 05/21/19 0508  NA 142 139  139 139  K 4.3 3.8  3.8 4.4  CL 111 111  111 110  CO2 22 19*  19*  20*  GLUCOSE 110* 93  93 90  BUN 50* 60*  63* 64*  CREATININE 2.95* 3.07*  3.08* 3.24*  CALCIUM 7.9* 7.7*  7.4* 7.6*  MG 2.3 2.1  --   AST 52*  --   --   ALT 27  --   --   ALKPHOS 86  --   --   BILITOT 4.0*  --   --    RADIOLOGY:  DG Skull 1-3 Views  Result Date: 05/21/2019 CLINICAL DATA:  Pre MRI clearance. EXAM: SKULL - 1-3 VIEW COMPARISON:  CT head 08/20/2018. FINDINGS: The skull is intact. There is no evidence of acute fracture or focal lesion. There are no radiopaque foreign bodies.  Pineal region calcifications are noted. The visualized paranasal sinuses are clear. IMPRESSION: No evidence of intracranial radiopaque foreign body acute osseous findings. Electronically Signed   By: Richardean Sale M.D.   On: 05/21/2019 13:54   DG Chest 1 View  Result Date: 05/21/2019 CLINICAL DATA:  Increasing shortness of breath. Smoker. History of asthma/COPD. EXAM: CHEST  1 VIEW COMPARISON:  Radiographs 05/19/2019 and 05/15/2019. CT 05/15/2019. FINDINGS: 1324 hours. Left pleural effusion has partially reaccumulated and appears loculated laterally. The underlying cavitary lesions in the left lung are partially obscured, although grossly stable. There is increased compressive left lung atelectasis. The right lung is clear. There is no pneumothorax. The heart size and mediastinal contours are stable. IMPRESSION: 1. Interval partial reaccumulation of loculated left pleural effusion. 2. No pneumothorax. Electronically Signed   By: Richardean Sale M.D.   On: 05/21/2019 13:58   MR THORACIC SPINE WO CONTRAST  Result Date: 05/21/2019 CLINICAL DATA:  Sepsis. Irrigation and drainage of right flank abscess yesterday. EXAM: MRI THORACIC AND LUMBAR SPINE WITHOUT CONTRAST TECHNIQUE: Multiplanar and multiecho pulse sequences of the thoracic and lumbar spine were obtained without intravenous contrast. COMPARISON:  Abdominopelvic CT 05/18/2019. Chest CT 05/15/2019. FINDINGS: MRI THORACIC SPINE FINDINGS Alignment:  Normal. Vertebrae: There is a hemangioma within the left aspect of the T7 vertebral body. No evidence of acute fracture, subluxation, discitis or osteomyelitis. The localizing images demonstrate spondylosis in the lower cervical spine without apparent acute abnormality. Cord: The thoracic cord appears normal in signal and caliber. Conus medullaris extends to the L1 level. Paraspinal and other soft tissues: No focal paraspinal abnormalities are identified. There are left-greater-than-right pleural effusions  with dependent airspace opacities bilaterally. There are nodular components in both lungs, also similar to previous CT. Disc levels: Detail is mildly degraded by motion artifact. No large disc herniations are demonstrated. There is a small right paracentral disc protrusion at T6-7. There is a slightly larger right paracentral disc protrusion at T7-8. No cord deformity or significant foraminal narrowing. MRI LUMBAR SPINE FINDINGS Segmentation: There is transitional lumbosacral anatomy. As numbered from above, there is a transitional S1 segment with a vestigial S1-2 disc space. Alignment:  Normal. Vertebrae: There is no evidence of discitis or vertebral body osteomyelitis. However, the right L4-5 facet joint is widened with a joint effusion and surrounding soft tissue edema. There is a posterior epidural fluid collection which extends superiorly from the L4-5 facet joint to the upper L3 level. On the sagittal images, this extends approximately 7.3 cm in length (image 9/1). This fluid collection measures up to 14 x 9 mm transverse (image 22/4) and is highly suspicious for an epidural abscess. There is some mass effect on the thecal sac. No apparent cortical destruction of the posterior elements. Conus medullaris and cauda equina: Conus extends  to the L1 level. Paraspinal and other soft tissues: As above, there are paraspinal inflammatory changes around the right L4-5 facet joint with a posterior epidural fluid collection suspicious for an abscess. No paraspinal abscesses are identified. Disc levels: No significant disc space findings at or above L3-4. As described above, the posterior epidural abscess extends superiorly to the level of the upper L3 vertebral body. L4-5: Mild disc bulging. As above, asymmetric right facet joint effusion with paraspinal and posterior right epidural fluid collections highly worrisome for infection. No significant foraminal compromise. L5-S1: Loss of disc height with annular disc bulging  and a broad-based central disc protrusion. Mild narrowing of the right lateral recess and mild bilateral facet hypertrophy. The foramina are sufficiently patent. S1-2: Transitional disc space level appears unremarkable. IMPRESSION: 1. Transitional lumbosacral anatomy. As numbered from above, there is a transitional S1 segment with a vestigial S1-2 disc space. 2. Probable septic right L4-5 facet joint with paraspinal inflammatory changes and a posterior epidural abscess extending superiorly from the L4-5 facet joint to the upper L3 level. There is some mass effect on the thecal sac. 3. No paraspinal abscesses identified. 4. No evidence of discitis or vertebral body osteomyelitis. 5. Bilateral pleural effusions with nodular components bilaterally consistent with septic emboli, similar to previous CT. 6. These results were called by telephone at the time of interpretation on 05/21/2019 at 2:45 pm to provider [facility] Meryl Hubers , who verbally acknowledged these results. Electronically Signed   By: Richardean Sale M.D.   On: 05/21/2019 14:50   MR LUMBAR SPINE WO CONTRAST  Result Date: 05/21/2019 CLINICAL DATA:  Sepsis. Irrigation and drainage of right flank abscess yesterday. EXAM: MRI THORACIC AND LUMBAR SPINE WITHOUT CONTRAST TECHNIQUE: Multiplanar and multiecho pulse sequences of the thoracic and lumbar spine were obtained without intravenous contrast. COMPARISON:  Abdominopelvic CT 05/18/2019. Chest CT 05/15/2019. FINDINGS: MRI THORACIC SPINE FINDINGS Alignment:  Normal. Vertebrae: There is a hemangioma within the left aspect of the T7 vertebral body. No evidence of acute fracture, subluxation, discitis or osteomyelitis. The localizing images demonstrate spondylosis in the lower cervical spine without apparent acute abnormality. Cord: The thoracic cord appears normal in signal and caliber. Conus medullaris extends to the L1 level. Paraspinal and other soft tissues: No focal paraspinal abnormalities are identified.  There are left-greater-than-right pleural effusions with dependent airspace opacities bilaterally. There are nodular components in both lungs, also similar to previous CT. Disc levels: Detail is mildly degraded by motion artifact. No large disc herniations are demonstrated. There is a small right paracentral disc protrusion at T6-7. There is a slightly larger right paracentral disc protrusion at T7-8. No cord deformity or significant foraminal narrowing. MRI LUMBAR SPINE FINDINGS Segmentation: There is transitional lumbosacral anatomy. As numbered from above, there is a transitional S1 segment with a vestigial S1-2 disc space. Alignment:  Normal. Vertebrae: There is no evidence of discitis or vertebral body osteomyelitis. However, the right L4-5 facet joint is widened with a joint effusion and surrounding soft tissue edema. There is a posterior epidural fluid collection which extends superiorly from the L4-5 facet joint to the upper L3 level. On the sagittal images, this extends approximately 7.3 cm in length (image 9/1). This fluid collection measures up to 14 x 9 mm transverse (image 22/4) and is highly suspicious for an epidural abscess. There is some mass effect on the thecal sac. No apparent cortical destruction of the posterior elements. Conus medullaris and cauda equina: Conus extends to the L1 level. Paraspinal and other soft  tissues: As above, there are paraspinal inflammatory changes around the right L4-5 facet joint with a posterior epidural fluid collection suspicious for an abscess. No paraspinal abscesses are identified. Disc levels: No significant disc space findings at or above L3-4. As described above, the posterior epidural abscess extends superiorly to the level of the upper L3 vertebral body. L4-5: Mild disc bulging. As above, asymmetric right facet joint effusion with paraspinal and posterior right epidural fluid collections highly worrisome for infection. No significant foraminal compromise.  L5-S1: Loss of disc height with annular disc bulging and a broad-based central disc protrusion. Mild narrowing of the right lateral recess and mild bilateral facet hypertrophy. The foramina are sufficiently patent. S1-2: Transitional disc space level appears unremarkable. IMPRESSION: 1. Transitional lumbosacral anatomy. As numbered from above, there is a transitional S1 segment with a vestigial S1-2 disc space. 2. Probable septic right L4-5 facet joint with paraspinal inflammatory changes and a posterior epidural abscess extending superiorly from the L4-5 facet joint to the upper L3 level. There is some mass effect on the thecal sac. 3. No paraspinal abscesses identified. 4. No evidence of discitis or vertebral body osteomyelitis. 5. Bilateral pleural effusions with nodular components bilaterally consistent with septic emboli, similar to previous CT. 6. These results were called by telephone at the time of interpretation on 05/21/2019 at 2:45 pm to provider [facility] Tane Biegler , who verbally acknowledged these results. Electronically Signed   By: Richardean Sale M.D.   On: 05/21/2019 14:50   ASSESSMENT AND PLAN:  63 y.o. male with history of solitary left kidney complicated by partial obstructing ureteral calculus status post emergency ureteral stent placement, MRSA bacteremia, and COPD admitted initially by PCCM for respiratory failure   Severe sepsiswith septic shock - MRSA bacteremia likely from previous central line which is removed Acute respiratory failure due to COPD exacerbation in the setting of Severe sepsis -present on admission Pyomyositis SP right-sided flank drain placement Possible empyema SP thoracentesis. Epidural abscess as well as L4-L5 infection  Presented with sepsis.  Initially admitted to the ICU after intubation.  Patient was extubated and was transferred to the floor. Currently on 3 L of oxygen. Blood cultures came back positive for MRSA. Urine culture came back positive for  VRE. 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 Infectious disease following the patient. Patient had left IJ placed on 05/12/2019 when he was bacteremic.  This line is removed now.    Urology was initially consulted as well which currently has signed off. General surgery is currently following up on the patient.  For empyema patient will require a cardiothoracic consultation and we will consult them depending on the location the patient goes to.  Discussed with [facility]surgery at [facility] with Dr. [name] who recommends conservative management with IV antibiotics as long as the patient does not have any symptoms. We will consult cardiothoracic surgery  Continue Teflaro and Zyvox per ID So far repeat blood cultures have been negative.  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  The stricture was dilated.  The stent was placed in the left ureter.  The urine culture on 05/11/2019 was negative.  A repeat urine culture was sent on 05/16/2019 and that is positive for VRE  VRE urinary tract infection -on Zyvox Known history of ESBL  A. fib/flutter  with rapid ventricular rate -Currently in sinus tach with PVCs/PACs -On AMIO drip, if able to swallow can start him on Lopressor for rate control cardiology -Anticoagulation not recommended due to history of fall -Echo with normal LV function  Alcohol withdrawal syndrome -Monitor for withdrawal  Acute on chronic renal failure Solitary kidney Avoid nephrotoxins Maintain perfusion,avoid hypotension We will consult nephrology tomorrow on 05/22/2019.  Paranoid schizophrenia Resume home medications as soon as feasible   Hypernatremia/hypokalemia Replete and recheck  DVT prophylaxis:heparin SQ  GI  prophylax:PPI  All the records are reviewed and case discussed with Care Management/Social Worker. Management plans discussed with the patient, nursing and they are in agreement.  CODE STATUS: Full Code  TOTAL TIME TAKING CARE OF THIS PATIENT: 35 minutes.   More than 50% of the time was spent in counseling/coordination of care: Cristie Hem M.D on 05/21/2019 at 7:11 PM  After 6pm go to [email or web address] - password [email or web address]  [facility]   CC: Primary care physician; [name], MD  Note: This dictation was prepared with Dragon dictation along with smaller phrase technology. Any transcriptional errors that result from this process are unintentional.

## 2019-05-21 NOTE — Progress Notes (Signed)
[facility] SURGICAL PROGRESS NOTE (cpt [phone])  Hospital Day(s): 10.   Post op day(s): 1 Day Post-Op.   Interval History: Patient seen and examined.  Underwent incision and drainage of right flank abscess in the OR yesterday with approximately 400 cc of purulent fluid evacuated.  Cultures have been sent, but are still pending.  Leukocytosis is stable.  No tachycardia.  Review of Systems:  Constitutional: denies fever, chills  Respiratory: denies any shortness of breath  Cardiovascular: denies chest pain or palpitations  Musculoskeletal: + right sided pain   Vital signs in last 24 hours: [min-max] current  Temp:  [97.2 F (36.2 C)-98.3 F (36.8 C)] 98.3 F (36.8 C) (12/19 0758) Pulse Rate:  [72-88] 78 (12/19 0758) Resp:  [16-20] 19 (12/19 0758) BP: (84-119)/(56-67) 119/67 (12/19 0758) SpO2:  [91 %-99 %] 96 % (12/19 0758) Weight:  [89.5 kg] 89.5 kg (12/19 0412)     Height: 6' (182.9 cm) Weight: 89.5 kg BMI (Calculated): 26.75   Intake/Output last 2 shifts:  12/18 0701 - 12/19 0700 In: 1500 [I.V.:400; IV Piggyback:1100] Out: 910 [Urine:700; Drains:200; Blood:10]   Physical Exam:  Constitutional: alert, cooperative and no distress  HENT: normocephalic without obvious abnormality  Eyes: PERRL, EOM's grossly intact and symmetric  Respiratory: breathing non-labored at rest  Integumentary:Erythema over right side and flank improved.  JP drain in situ with serosanguineous fluid present.  Honeycomb dressing present over incision with a small amount of strikethrough.      Labs:  CBC Latest Ref Rng & Units 05/21/2019 05/20/2019 05/19/2019  WBC 4.0 - 10.5 K/uL 16.5(H) 15.9(H) 19.0(H)  Hemoglobin 13.0 - 17.0 g/dL 8.9(L) 9.0(L) 9.7(L)  Hematocrit 39.0 - 52.0 % 27.3(L) 28.2(L) 30.3(L)  Platelets 150 - 400 K/uL 327 282 233   CMP Latest Ref Rng & Units 05/21/2019 05/20/2019 05/20/2019  Glucose 70 - 99 mg/dL 90 93 93  BUN 8 - 23 mg/dL 64(H) 60(H) 63(H)  Creatinine  0.61 - 1.24 mg/dL 3.24(H) 3.07(H) 3.08(H)  Sodium 135 - 145 mmol/L 139 139 139  Potassium 3.5 - 5.1 mmol/L 4.4 3.8 3.8  Chloride 98 - 111 mmol/L 110 111 111  CO2 22 - 32 mmol/L 20(L) 19(L) 19(L)  Calcium 8.9 - 10.3 mg/dL 7.6(L) 7.7(L) 7.4(L)  Total Protein 6.5 - 8.1 g/dL - - -  Total Bilirubin 0.3 - 1.2 mg/dL - - -  Alkaline Phos 38 - 126 U/L - - -  AST 15 - 41 U/L - - -  ALT 0 - 44 U/L - - -     Imaging studies:   CT Abdomen/Pelvis without contrast (05/18/2019) personally reviewed and d/w with Dr Christian Mate which shows right flank fluid collection which remains relatively unchanged in size compared to prior, no subcutaneous gas, there is inflammatory changes to the skin and surrounding subcutaneous tissues, unfortunately study is without IV contrast 2/2 renal function. Radiologist report reviewed below: IMPRESSION: Moderate left pleural effusion, increased since prior study. Bibasilar atelectasis or consolidation/pneumonia.  Enlargement of the right oblique abdominal wall musculature with low-density/fluid density within the muscle. This could reflect myositis, muscle injury, or abscess. This is similar to prior study.  Left ureteral stent in place. No hydronephrosis or change since prior study. Solitary left kidney.  Aortic atherosclerosis.    Assessment/Plan: (ICD-10's: A41.9) 63 y.o. male with right chest wall/flank fluid collection of unclear etiology.  Due to lack of other viable options, he underwent incision and drainage of a submuscular fluid collection yesterday.  -Cultures are pending; tailor  antibiotics appropriately as results return. -Engage physical therapy and social work. -We will continue to monitor.

## 2019-05-21 NOTE — Consult Note (Signed)
PHARMACY CONSULT NOTE - FOLLOW UP  Pharmacy Consult for Electrolyte Monitoring and Replacement   Recent Labs: Potassium (mmol/L)  Date Value  05/21/2019 4.4  09/19/2014 3.7   Magnesium (mg/dL)  Date Value  05/20/2019 2.1  12/08/2012 1.8   Calcium (mg/dL)  Date Value  05/21/2019 7.6 (L)   Calcium, Total (mg/dL)  Date Value  09/19/2014 8.9   Albumin (g/dL)  Date Value  05/21/2019 1.4 (L)  09/19/2014 3.5   Phosphorus (mg/dL)  Date Value  05/21/2019 5.7 (H)   Sodium (mmol/L)  Date Value  05/21/2019 139  09/19/2014 86   63 year old male admitted with MRSA bacteremia with a past medical history of asthma, COPD, depression, gastric ulcer, GERD, hypothyroid, MI, stroke, schizophrenia, and seizure.  He has a complicated urological history as well, as he only possesses one kidney with emergent stent placement this admission.    Goal of Therapy:  All electrolytes wnl  Plan:  Per hospitalist, would like to hold off on ordering phosphate binders currently to help lower patient's phosphate level and recheck in the AM  Will obtain BMP with am labs.   Pharmacy will continue to monitor and adjust per consult.   Olivia Canter, RPh  05/21/2019 9:56 AM

## 2019-05-22 ENCOUNTER — Inpatient Hospital Stay: Payer: Medicare Other

## 2019-05-22 LAB — BODY FLUID CULTURE

## 2019-05-22 LAB — CBC
HCT: 28 % — ABNORMAL LOW (ref 39.0–52.0)
Hemoglobin: 8.9 g/dL — ABNORMAL LOW (ref 13.0–17.0)
MCH: 28.3 pg (ref 26.0–34.0)
MCHC: 31.8 g/dL (ref 30.0–36.0)
MCV: 88.9 fL (ref 80.0–100.0)
Platelets: 365 10*3/uL (ref 150–400)
RBC: 3.15 MIL/uL — ABNORMAL LOW (ref 4.22–5.81)
RDW: 15.1 % (ref 11.5–15.5)
WBC: 14.8 10*3/uL — ABNORMAL HIGH (ref 4.0–10.5)
nRBC: 0 % (ref 0.0–0.2)

## 2019-05-22 LAB — COMPREHENSIVE METABOLIC PANEL
ALT: 26 U/L (ref 0–44)
AST: 37 U/L (ref 15–41)
Albumin: 1.6 g/dL — ABNORMAL LOW (ref 3.5–5.0)
Alkaline Phosphatase: 181 U/L — ABNORMAL HIGH (ref 38–126)
Anion gap: 6 (ref 5–15)
BUN: 60 mg/dL — ABNORMAL HIGH (ref 8–23)
CO2: 24 mmol/L (ref 22–32)
Calcium: 8 mg/dL — ABNORMAL LOW (ref 8.9–10.3)
Chloride: 111 mmol/L (ref 98–111)
Creatinine, Ser: 2.92 mg/dL — ABNORMAL HIGH (ref 0.61–1.24)
GFR calc Af Amer: 25 mL/min — ABNORMAL LOW (ref >60)
GFR calc non Af Amer: 22 mL/min — ABNORMAL LOW (ref >60)
Glucose, Bld: 98 mg/dL (ref 70–99)
Potassium: 5.3 mmol/L — ABNORMAL HIGH (ref 3.5–5.1)
Sodium: 141 mmol/L (ref 135–145)
Total Bilirubin: 1.5 mg/dL — ABNORMAL HIGH (ref 0.3–1.2)
Total Protein: 6.4 g/dL — ABNORMAL LOW (ref 6.5–8.1)

## 2019-05-22 LAB — RENAL FUNCTION PANEL
Albumin: 1.5 g/dL — ABNORMAL LOW (ref 3.5–5.0)
Anion gap: 7 (ref 5–15)
BUN: 68 mg/dL — ABNORMAL HIGH (ref 8–23)
CO2: 22 mmol/L (ref 22–32)
Calcium: 7.8 mg/dL — ABNORMAL LOW (ref 8.9–10.3)
Chloride: 111 mmol/L (ref 98–111)
Creatinine, Ser: 2.98 mg/dL — ABNORMAL HIGH (ref 0.61–1.24)
GFR calc Af Amer: 25 mL/min — ABNORMAL LOW (ref >60)
GFR calc non Af Amer: 21 mL/min — ABNORMAL LOW (ref >60)
Glucose, Bld: 124 mg/dL — ABNORMAL HIGH (ref 70–99)
Phosphorus: 4.4 mg/dL (ref 2.5–4.6)
Potassium: 5.3 mmol/L — ABNORMAL HIGH (ref 3.5–5.1)
Sodium: 140 mmol/L (ref 135–145)

## 2019-05-22 LAB — RAPID HIV SCREEN (HIV 1/2 AB+AG)
HIV 1/2 Antibodies: NONREACTIVE
HIV-1 P24 Antigen - HIV24: NONREACTIVE

## 2019-05-22 LAB — TSH: TSH: 9.012 u[IU]/mL — ABNORMAL HIGH (ref 0.350–4.500)

## 2019-05-22 LAB — T4, FREE: Free T4: 0.73 ng/dL (ref 0.61–1.12)

## 2019-05-22 LAB — VITAMIN B12: Vitamin B-12: 618 pg/mL (ref 180–914)

## 2019-05-22 LAB — GLUCOSE, CAPILLARY: Glucose-Capillary: 94 mg/dL (ref 70–99)

## 2019-05-22 LAB — AMMONIA: Ammonia: 12 umol/L (ref 9–35)

## 2019-05-22 LAB — MAGNESIUM: Magnesium: 2.4 mg/dL (ref 1.7–2.4)

## 2019-05-22 MED ORDER — THIAMINE HCL 100 MG/ML IJ SOLN
100.0000 mg | INTRAMUSCULAR | Status: AC
  Administered 2019-05-22 – 2019-05-24 (×3): 100 mg via INTRAVENOUS
  Filled 2019-05-22 (×3): qty 2

## 2019-05-22 MED ORDER — FENTANYL CITRATE (PF) 100 MCG/2ML IJ SOLN: 100.0000 ug | Freq: Once | INTRAMUSCULAR | Status: AC

## 2019-05-22 MED ORDER — MIDAZOLAM HCL 2 MG/2ML IJ SOLN: 2.0000 mg | Freq: Once | INTRAMUSCULAR | Status: AC

## 2019-05-22 MED ORDER — FUROSEMIDE 10 MG/ML IJ SOLN
40.0000 mg | Freq: Once | INTRAMUSCULAR | Status: AC
  Administered 2019-05-22: 40 mg via INTRAVENOUS
  Filled 2019-05-22: qty 4

## 2019-05-22 MED ORDER — FENTANYL CITRATE (PF) 100 MCG/2ML IJ SOLN
INTRAMUSCULAR | Status: AC
  Administered 2019-05-22: 75 ug via INTRAVENOUS
  Filled 2019-05-22: qty 2

## 2019-05-22 MED ORDER — MIDAZOLAM HCL 2 MG/2ML IJ SOLN
INTRAMUSCULAR | Status: AC
  Administered 2019-05-22: 1 mg via INTRAVENOUS
  Filled 2019-05-22: qty 2

## 2019-05-22 MED ORDER — ALBUMIN HUMAN 25 % IV SOLN
12.5000 g | Freq: Once | INTRAVENOUS | Status: DC
  Filled 2019-05-22: qty 50

## 2019-05-22 MED ORDER — FENTANYL CITRATE (PF) 100 MCG/2ML IJ SOLN
INTRAMUSCULAR | Status: AC
  Filled 2019-05-22: qty 2

## 2019-05-22 MED ORDER — MIDAZOLAM HCL 2 MG/2ML IJ SOLN
INTRAMUSCULAR | Status: AC
  Filled 2019-05-22: qty 2

## 2019-05-22 NOTE — Procedures (Addendum)
Chest Tube Insertion: Indication: LEFT empyema thoracis Consent: Verbal from patient  Risks and benefits explained in detail including risk of infection, bleeding, respiratory failure and death..   Hand washing performed prior to starting the procedure.   Type of Anesthesia: 1 % Lidocaine.  Sedation: 1 mg Versed IV +75 mcg fentanyl IV. Patient in stepdown, monitored during procedure.  Procedure: An active timeout was performed and correct patient, name, & ID confirmed.  After explaining risks and benefits, patient positioned correctly for chest tube placement. Patient was prepped in a sterile fashion including chlorohexadine preps, sterile drape, sterile gown and sterile gloves. Introducer needle was placed and guide wire was inserted. Using Seldinger Technique, the introducer needle was removed and three dilators were used to create an opening for insertion of chest tube.  #24 French Thal-Quick chest tube was inserted.  Chest tube sutured in place and dressed with Xeroform gauze, drain sponges and gauze.  Taped in place securely.  Findings: Purulent fluid obtained and draining freely into Pleur-evac.  Chest tube started bewteen 5-7 to costal spaces, LEFT midaxiilary line.   Chest tube connected to: Pleur-evac.   Number of Attempts:1  Complications: None apparent  Estimated Blood Loss: Nil  Chest radiograph shows decreased pleural effusion on the left, a loculation persists.  May benefit from TPA DNase instillation starting 12/21.  Operator: Windy Canny. Derrill Kay, MD [place] [facility]

## 2019-05-22 NOTE — Progress Notes (Signed)
[facility] [place] NAME: Alan Small    MR#:  [id number]  DATE OF BIRTH:  1956-04-19  SUBJECTIVE:  CHIEF COMPLAINT:   Chief Complaint  Patient presents with   Shortness of Breath   Continues to have shortness of breath, reports worsening today, no chest pain but continues to have flank pain as well as back pain  REVIEW OF SYSTEMS:  Review of Systems  Constitutional: Negative for diaphoresis, fever, malaise/fatigue and weight loss.  HENT: Negative for ear discharge, ear pain, hearing loss, nosebleeds, sore throat and tinnitus.   Eyes: Negative for blurred vision and pain.  Respiratory: Negative for cough, hemoptysis, shortness of breath and wheezing.   Cardiovascular: Negative for chest pain, palpitations, orthopnea and leg swelling.  Gastrointestinal: Negative for abdominal pain, blood in stool, constipation, diarrhea, heartburn, nausea and vomiting.  Genitourinary: Negative for dysuria, frequency and urgency.  Musculoskeletal: Negative for back pain and myalgias.  Skin: Negative for itching and rash.  Neurological: Negative for dizziness, tingling, tremors, focal weakness, seizures, weakness and headaches.  Psychiatric/Behavioral: Negative for depression. The patient is not nervous/anxious.     DRUG ALLERGIES:   Allergies  Allergen Reactions   Mellaril [Thioridazine] Shortness Of Breath and Swelling   Buprenorphine Itching   Morphine And Related Itching   Navane [Thiothixene] Other (See Comments)    Reaction:  GI upset    Thorazine [Chlorpromazine] Other (See Comments)    Reaction:  Dizziness/fainting    VITALS:  Blood pressure (!) 154/79, pulse (!) 101, temperature 99 F (37.2 C), temperature source Oral, resp. rate (!) 28, height 6' (1.829 m), weight 87.7 kg, SpO2 94 %. PHYSICAL EXAMINATION:  Physical Exam Constitutional:      General: He is in acute distress.  HENT:     Head: Normocephalic and atraumatic.  Eyes:   Conjunctiva/sclera: Conjunctivae normal.     Pupils: Pupils are equal, round, and reactive to light.  Neck:     Thyroid: No thyromegaly.     Trachea: No tracheal deviation.  Cardiovascular:     Rate and Rhythm: Normal rate and regular rhythm.     Heart sounds: Normal heart sounds.  Pulmonary:     Effort: Tachypnea and respiratory distress present.     Breath sounds: Decreased breath sounds and rales present. No wheezing or rhonchi.  Chest:     Chest wall: No tenderness.  Abdominal:     General: Bowel sounds are normal. There is no distension.     Palpations: Abdomen is soft.     Tenderness: There is abdominal tenderness. There is no guarding or rebound.     Comments: fullness rt flank area with some mild erythema  Musculoskeletal:        General: Normal range of motion.     Cervical back: Normal range of motion and neck supple.  Skin:    General: Skin is warm and dry.     Findings: No rash.  Neurological:     General: No focal deficit present.     Mental Status: He is alert and oriented to person, place, and time.     Motor: No weakness.    LABORATORY PANEL:  Male CBC Recent Labs  Lab 05/22/19 0545  WBC 14.8*  HGB 8.9*  HCT 28.0*  PLT 365   ------------------------------------------------------------------------------------------------------------------ Chemistries  Recent Labs  Lab 05/22/19 0545 05/22/19 1639  NA 140 141  K 5.3* 5.3*  CL 111 111  CO2 22 24  GLUCOSE 124*  98  BUN 68* 60*  CREATININE 2.98* 2.92*  CALCIUM 7.8* 8.0*  MG 2.4  --   AST  --  37  ALT  --  26  ALKPHOS  --  181*  BILITOT  --  1.5*   RADIOLOGY:  CT HEAD WO CONTRAST  Result Date: 05/22/2019 CLINICAL DATA:  Encephalopathy. Hallucinations. EXAM: CT HEAD WITHOUT CONTRAST TECHNIQUE: Contiguous axial images were obtained from the base of the skull through the vertex without intravenous contrast. COMPARISON:  08/20/2018 FINDINGS: Brain: There is no evidence of acute infarct,  intracranial hemorrhage, mass, midline shift, or extra-axial fluid collection. Mild cerebral atrophy is unchanged and likely within normal limits for age. Low level hypoattenuation in the cerebral white matter is nonspecific but may reflect mild chronic small vessel ischemic disease. Vascular: Calcified atherosclerosis at the skull base. No hyperdense vessel. Skull: No fracture or focal osseous lesion. Sinuses/Orbits: Visualized paranasal sinuses and mastoid air cells are clear. Orbits are unremarkable. Other: None. IMPRESSION: No evidence of acute intracranial abnormality. Electronically Signed   By: Logan Bores M.D.   On: 05/22/2019 15:26   CT CHEST WO CONTRAST  Result Date: 05/22/2019 CLINICAL DATA:  63 year old male with increased shortness of breath, known septic emboli EXAM: CT CHEST WITHOUT CONTRAST TECHNIQUE: Multidetector CT imaging of the chest was performed following the standard protocol without IV contrast. COMPARISON:  Prior CT scan of the chest 05/15/2019 FINDINGS: Cardiovascular: Two vessel aortic arch. The right brachiocephalic and left common carotid artery share a common origin. The aorta is normal in caliber. The main pulmonary artery is normal in caliber. The heart is normal in size. No pericardial effusion. Mediastinum/Nodes: Unremarkable CT appearance of the thyroid gland. No suspicious mediastinal or hilar adenopathy. No soft tissue mediastinal mass. The thoracic esophagus is unremarkable. Lungs/Pleura: Approximately 1.1 cm left upper lobe pulmonary nodule is similar in size compared to prior but with decreased central cavitation. Left-sided pulmonary nodule measures 2.5 cm in diameter. Numerous additional scattered pulmonary nodules as previously seen. Increased subsegmental atelectasis in the right lung base. No evidence of pulmonary edema. Enlarging left pleural effusion with associated left lower lobe atelectasis. Upper Abdomen: Circumscribed 10.4 cm low-attenuation cystic lesion  exophytic from the left lobe of the liver. There is a smaller 2.4 cm cyst arising from the caudate lobe of the liver and an additional low-attenuation lesion posteriorly in hepatic segment 6 which measures 2 cm. All lesions are essentially unchanged compared to the recent prior imaging. Musculoskeletal: No acute fracture or aggressive appearing lytic or blastic osseous lesion. IMPRESSION: 1. Persistent bilateral pulmonary nodules with slightly decreased cavitation compared to the recent prior imaging. Findings remain most consistent with bilateral septic pulmonary emboli. 2. Enlarging left-sided pleural effusion with left lower lobe atelectasis. 3. Slightly increased right lower lobe atelectasis as well. 4. No evidence of pulmonary edema. 5. Additional ancillary findings as above without significant interval change. Electronically Signed   By: Jacqulynn Cadet M.D.   On: 05/22/2019 15:33   DG Chest Port 1 View  Result Date: 05/22/2019 CLINICAL DATA:  Status post left chest tube placement EXAM: PORTABLE CHEST 1 VIEW COMPARISON:  May 22, 2019 FINDINGS: A left chest tube has been placed in the interval. The left-sided pleural effusion remains but is smaller. There may be some air in the pleural space on the left, introduced from the chest tube placement. No other evidence of pneumothorax. No other acute abnormalities. Opacity underlying the effusion remains. IMPRESSION: A left-sided chest tube has been placed and  the left-sided pleural effusion remains but is smaller. There may be some air in the left pleural space, introduced from the chest tube placement. Opacity underlying the left-sided effusion remains, likely atelectasis. Electronically Signed   By: Dorise Bullion III M.D   On: 05/22/2019 18:39   DG Chest Port 1 View  Result Date: 05/22/2019 CLINICAL DATA:  Shortness of breath EXAM: PORTABLE CHEST 1 VIEW COMPARISON:  05/21/2019 FINDINGS: Parents slight increase in moderate to large loculated LEFT  pleural effusion noted with increasing LEFT lung opacities/atelectasis. Slightly increasing mild interstitial opacities within the RIGHT lung noted. No evidence of pneumothorax. No other significant change. IMPRESSION: 1. Slight increase in moderate to large loculated LEFT pleural effusion with increasing LEFT lung opacities/atelectasis. 2. Slightly increasing mild interstitial opacities within the RIGHT lung which may represent edema or infection. Electronically Signed   By: Margarette Canada M.D.   On: 05/22/2019 14:56   ASSESSMENT AND PLAN:  63 y.o. male with history of solitary left kidney complicated by partial obstructing ureteral calculus status post emergency ureteral stent placement, MRSA bacteremia, and COPD admitted initially by PCCM for respiratory failure   Severe sepsiswith septic shock - MRSA bacteremia likely from previous central line which is removed Acute respiratory failure due to COPD exacerbation in the setting of Severe sepsis -present on admission Pyomyositis SP right-sided flank drain placement empyema SP thoracentesis. 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  Initially admitted to the ICU after intubation.  Patient was extubated and was transferred to the floor. Currently on 3 L of oxygen. Blood cultures came back positive for MRSA. Urine culture came back positive for VRE. 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 Infectious disease following the patient. Patient had left IJ placed on 05/12/2019 when he was bacteremic.  This line is removed now.    Urology was initially consulted as well which currently has signed off. General surgery is currently following up on the patient.  For empyema highly appreciate PCCM Dr. Patsey Berthold assistance for placement of chest tube placement.   Discussed  with neurosurgery at [facility] with Dr. [name] who recommends conservative management with IV antibiotics as long as the patient does not have any symptoms.  Continue Teflaro and Zyvox per ID So far repeat blood cultures have been negative.  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   The stricture was dilated.   The stent was placed in the left ureter.   The urine culture on 05/11/2019 was negative.  A repeat urine culture was sent on 05/16/2019 and that is positive for VRE  VRE urinary tract infection -on Zyvox Known history of ESBL  A. fib/flutter with rapid ventricular rate -Currently in sinus tach with PVCs/PACs -was on amiodaron drip, for now on lopressor -Anticoagulation not recommended due to history of fall -Echo with normal LV function  Alcohol withdrawal syndrome -Monitor for withdrawal  Acute on chronic renal failure Solitary kidney Avoid nephrotoxins Maintain perfusion,avoid hypotension Check US renal  Paranoid schizophrenia Resume home medications as soon as feasible   Hypernatremia/hypokalemia Replete and recheck  DVT prophylaxis:heparin SQ  GI prophylax:PPI  All the records are reviewed and case discussed with Care Management/Social Worker. Management plans discussed with the patient, nursing and they are in agreement.  CODE STATUS: Full Code  TOTAL TIME TAKING CARE OF THIS PATIENT: 35 minutes.   More than 50% of the time was spent in counseling/coordination of care: Cristie Hem M.D on 05/22/2019 at 6:44 PM  After 6pm go to [email or web address] - password [email or web address]  [facility]   CC: Primary care physician; [name], MD  Note: This dictation was prepared with Dragon dictation along with smaller phrase technology. Any transcriptional errors that result from this process are unintentional.

## 2019-05-22 NOTE — Progress Notes (Signed)
[facility] SURGICAL PROGRESS NOTE (cpt [phone])  Hospital Day(s): 11.   Post op day(s): 2 Days Post-Op.   Interval History: Patient seen and examined.  Underwent incision and drainage of right flank abscess in the OR Friday, with approximately 400 cc of purulent fluid evacuated.  Cultures have been sent, S. Aureus idendtified, sensitivities pending. Leukocytosis is improving.  No tachycardia. Patient states right side is feeling better, but left side is tender.  Review of Systems:  Constitutional: denies fever, chills  Respiratory: denies any shortness of breath  Cardiovascular: denies chest pain or palpitations  Musculoskeletal: + right sided pain   Vital signs in last 24 hours: [min-max] current  Temp:  [97.9 F (36.6 C)-98.6 F (37 C)] 98.6 F (37 C) (12/20 0724) Pulse Rate:  [83-101] 101 (12/20 0724) Resp:  [18-20] 19 (12/20 0724) BP: (110-144)/(59-83) 144/76 (12/20 0724) SpO2:  [95 %-97 %] 95 % (12/20 0724) Weight:  [87.7 kg] 87.7 kg (12/20 0301)     Height: 6' (182.9 cm) Weight: 87.7 kg BMI (Calculated): 26.22   Intake/Output last 2 shifts:  12/19 0701 - 12/20 0700 In: 87.5 [IV Piggyback:87.5] Out: 8099 [Urine:925; Drains:135]   Physical Exam:  Constitutional: alert, cooperative and no distress  HENT: normocephalic without obvious abnormality  Eyes: PERRL, EOM's grossly intact and symmetric  Respiratory: breathing non-labored at rest  Integumentary:Erythema over right side and flank improved.  JP drain in situ with serosanguineous fluid present.  Honeycomb dressing present over incision with a small amount of strikethrough.      Labs:  CBC Latest Ref Rng & Units 05/22/2019 05/21/2019 05/20/2019  WBC 4.0 - 10.5 K/uL 14.8(H) 16.5(H) 15.9(H)  Hemoglobin 13.0 - 17.0 g/dL 8.9(L) 8.9(L) 9.0(L)  Hematocrit 39.0 - 52.0 % 28.0(L) 27.3(L) 28.2(L)  Platelets 150 - 400 K/uL 365 327 282   CMP Latest Ref Rng & Units 05/22/2019 05/21/2019 05/20/2019  Glucose  70 - 99 mg/dL 124(H) 90 93  BUN 8 - 23 mg/dL 68(H) 64(H) 60(H)  Creatinine 0.61 - 1.24 mg/dL 2.98(H) 3.24(H) 3.07(H)  Sodium 135 - 145 mmol/L 140 139 139  Potassium 3.5 - 5.1 mmol/L 5.3(H) 4.4 3.8  Chloride 98 - 111 mmol/L 111 110 111  CO2 22 - 32 mmol/L 22 20(L) 19(L)  Calcium 8.9 - 10.3 mg/dL 7.8(L) 7.6(L) 7.7(L)  Total Protein 6.5 - 8.1 g/dL - - -  Total Bilirubin 0.3 - 1.2 mg/dL - - -  Alkaline Phos 38 - 126 U/L - - -  AST 15 - 41 U/L - - -  ALT 0 - 44 U/L - - -     Imaging studies:   CT Abdomen/Pelvis without contrast (05/18/2019) personally reviewed and d/w with Dr Christian Mate which shows right flank fluid collection which remains relatively unchanged in size compared to prior, no subcutaneous gas, there is inflammatory changes to the skin and surrounding subcutaneous tissues, unfortunately study is without IV contrast 2/2 renal function. Radiologist report reviewed below: IMPRESSION: Moderate left pleural effusion, increased since prior study. Bibasilar atelectasis or consolidation/pneumonia.  Enlargement of the right oblique abdominal wall musculature with low-density/fluid density within the muscle. This could reflect myositis, muscle injury, or abscess. This is similar to prior study.  Left ureteral stent in place. No hydronephrosis or change since prior study. Solitary left kidney.  Aortic atherosclerosis.    Assessment/Plan: (ICD-10's: A41.9) 63 y.o. male with right chest wall/flank fluid collection of unclear etiology.  Due to lack of other viable options, he underwent incision and drainage of  a submuscular fluid collection on Friday..  -S. Aureus on cultures, sensitivities are pending; tailor antibiotics appropriately as results return. -Engage physical therapy and social work. -We will continue to monitor.

## 2019-05-22 NOTE — Consult Note (Signed)
PHARMACY CONSULT NOTE - FOLLOW UP  Pharmacy Consult for Electrolyte Monitoring and Replacement   Recent Labs: Potassium (mmol/L)  Date Value  05/22/2019 5.3 (H)  09/19/2014 3.7   Magnesium (mg/dL)  Date Value  05/22/2019 2.4  12/08/2012 1.8   Calcium (mg/dL)  Date Value  05/22/2019 7.8 (L)   Calcium, Total (mg/dL)  Date Value  09/19/2014 8.9   Albumin (g/dL)  Date Value  05/22/2019 1.5 (L)  09/19/2014 3.5   Phosphorus (mg/dL)  Date Value  05/22/2019 4.4   Sodium (mmol/L)  Date Value  05/22/2019 140  09/19/2014 91   62 year old male admitted with MRSA bacteremia with a past medical history of asthma, COPD, depression, gastric ulcer, GERD, hypothyroid, MI, stroke, schizophrenia, and seizure.  He has a complicated urological history as well, as he only possesses one kidney with emergent stent placement this admission.    Goal of Therapy:  All electrolytes wnl  Plan:  Per hospitalist, would like to hold off on ordering phosphate binders currently to help lower patient's phosphate level and recheck in the AM  Will obtain BMP with am labs.   Pharmacy will continue to monitor and adjust per consult.   [place]  05/22/2019 8:28 AM

## 2019-05-22 NOTE — Progress Notes (Signed)
Patient noted to have increased dyspnea at rest.  IV lasix given at the bedside. While patient was speaking to Dr. Posey Pronto, he was noted to be hallucinating.  He told this RN that he knew they weren't real but still seeing them.  At this time, Dr. Posey Pronto would like patient in ICU for closer monitoring.  Report given to [facility] in ICU and patient transferred to the unit.

## 2019-05-23 ENCOUNTER — Inpatient Hospital Stay: Payer: Medicare Other

## 2019-05-23 DIAGNOSIS — Z8619 Personal history of other infectious and parasitic diseases: Secondary | ICD-10-CM

## 2019-05-23 LAB — RENAL FUNCTION PANEL
Albumin: 1.5 g/dL — ABNORMAL LOW (ref 3.5–5.0)
Anion gap: 10 (ref 5–15)
BUN: 57 mg/dL — ABNORMAL HIGH (ref 8–23)
CO2: 18 mmol/L — ABNORMAL LOW (ref 22–32)
Calcium: 7.9 mg/dL — ABNORMAL LOW (ref 8.9–10.3)
Chloride: 111 mmol/L (ref 98–111)
Creatinine, Ser: 2.85 mg/dL — ABNORMAL HIGH (ref 0.61–1.24)
GFR calc Af Amer: 26 mL/min — ABNORMAL LOW (ref >60)
GFR calc non Af Amer: 22 mL/min — ABNORMAL LOW (ref >60)
Glucose, Bld: 94 mg/dL (ref 70–99)
Phosphorus: 5.1 mg/dL — ABNORMAL HIGH (ref 2.5–4.6)
Potassium: 5.1 mmol/L (ref 3.5–5.1)
Sodium: 139 mmol/L (ref 135–145)

## 2019-05-23 LAB — CBC
HCT: 26.5 % — ABNORMAL LOW (ref 39.0–52.0)
Hemoglobin: 8 g/dL — ABNORMAL LOW (ref 13.0–17.0)
MCH: 28.2 pg (ref 26.0–34.0)
MCHC: 30.2 g/dL (ref 30.0–36.0)
MCV: 93.3 fL (ref 80.0–100.0)
Platelets: 378 10*3/uL (ref 150–400)
RBC: 2.84 MIL/uL — ABNORMAL LOW (ref 4.22–5.81)
RDW: 14.8 % (ref 11.5–15.5)
WBC: 13.2 10*3/uL — ABNORMAL HIGH (ref 4.0–10.5)
nRBC: 0 % (ref 0.0–0.2)

## 2019-05-23 LAB — RPR: RPR Ser Ql: NONREACTIVE

## 2019-05-23 NOTE — Progress Notes (Signed)
[facility] [place] NAME: Alan Small    MR#:  [id number]  DATE OF BIRTH:  1955/11/17  SUBJECTIVE:  CHIEF COMPLAINT:   Chief Complaint  Patient presents with  . Shortness of Breath  Breathing improved after placement of chest tube. No nausea no vomiting no fever no chills.   REVIEW OF SYSTEMS:  Review of Systems  Constitutional: Negative for diaphoresis, fever, malaise/fatigue and weight loss.  HENT: Negative for ear discharge, ear pain, hearing loss, nosebleeds, sore throat and tinnitus.   Eyes: Negative for blurred vision and pain.  Respiratory: Negative for cough, hemoptysis, shortness of breath and wheezing.   Cardiovascular: Negative for chest pain, palpitations, orthopnea and leg swelling.  Gastrointestinal: Negative for abdominal pain, blood in stool, constipation, diarrhea, heartburn, nausea and vomiting.  Genitourinary: Negative for dysuria, frequency and urgency.  Musculoskeletal: Negative for back pain and myalgias.  Skin: Negative for itching and rash.  Neurological: Negative for dizziness, tingling, tremors, focal weakness, seizures, weakness and headaches.  Psychiatric/Behavioral: Negative for depression. The patient is not nervous/anxious.     DRUG ALLERGIES:   Allergies  Allergen Reactions  . Mellaril [Thioridazine] Shortness Of Breath and Swelling  . Buprenorphine Itching  . Morphine And Related Itching  . Navane [Thiothixene] Other (See Comments)    Reaction:  GI upset   . Thorazine [Chlorpromazine] Other (See Comments)    Reaction:  Dizziness/fainting    VITALS:  Blood pressure 119/71, pulse 67, temperature 97.7 F (36.5 C), temperature source Oral, resp. rate 16, height 6' (1.829 m), weight 90.7 kg, SpO2 98 %. PHYSICAL EXAMINATION:  Physical Exam Constitutional:      General: He is in acute distress.  HENT:     Head: Normocephalic and atraumatic.  Eyes:     Conjunctiva/sclera: Conjunctivae normal.     Pupils:  Pupils are equal, round, and reactive to light.  Neck:     Thyroid: No thyromegaly.     Trachea: No tracheal deviation.  Cardiovascular:     Rate and Rhythm: Normal rate and regular rhythm.     Heart sounds: Normal heart sounds.  Pulmonary:     Effort: Tachypnea and respiratory distress present.     Breath sounds: Decreased breath sounds and rales present. No wheezing or rhonchi.  Chest:     Chest wall: No tenderness.  Abdominal:     General: Bowel sounds are normal. There is no distension.     Palpations: Abdomen is soft.     Tenderness: There is abdominal tenderness. There is no guarding or rebound.     Comments: fullness rt flank area with some mild erythema  Musculoskeletal:        General: Normal range of motion.     Cervical back: Normal range of motion and neck supple.  Skin:    General: Skin is warm and dry.     Findings: No rash.  Neurological:     General: No focal deficit present.     Mental Status: He is alert and oriented to person, place, and time.     Motor: No weakness.    LABORATORY PANEL:  Male CBC Recent Labs  Lab 05/23/19 0418  WBC 13.2*  HGB 8.0*  HCT 26.5*  PLT 378   ------------------------------------------------------------------------------------------------------------------ Chemistries  Recent Labs  Lab 05/22/19 0545 05/22/19 1639 05/23/19 0418  NA 140 141 139  K 5.3* 5.3* 5.1  CL 111 111 111  CO2 22 24 18*  GLUCOSE 124* 98 94  BUN 68* 60* 57*  CREATININE 2.98* 2.92* 2.85*  CALCIUM 7.8* 8.0* 7.9*  MG 2.4  --   --   AST  --  37  --   ALT  --  26  --   ALKPHOS  --  181*  --   BILITOT  --  1.5*  --    RADIOLOGY:  US RENAL  Result Date: 05/23/2019 CLINICAL DATA:  Acute kidney injury EXAM: RENAL / URINARY TRACT ULTRASOUND COMPLETE COMPARISON:  CT abdomen and pelvis 05/18/2019 FINDINGS: Right Kidney: Renal measurements: Prior nephrectomy Left Kidney: Renal measurements: 9.6 x 5.9 x 6.0 cm = volume: 181 mL. Mild  pelviectasis/hydronephrosis. 12 mm shadowing calcification in the midpole. Normal echotexture Bladder: Bladder wall appears mildly thickened measuring 6 mm anteriorly. Other: None. IMPRESSION: Mild left hydronephrosis/pelviectasis, likely increased since prior CT. Mild bladder wall thickening. Electronically Signed   By: Rolm Baptise M.D.   On: 05/23/2019 11:49   DG Chest Port 1 View  Result Date: 05/23/2019 CLINICAL DATA:  Empyema. EXAM: PORTABLE CHEST 1 VIEW COMPARISON:  Radiograph yesterday. CT yesterday. FINDINGS: Left-sided chest tube with tip directed laterally towards the upper lung zone. Decreased left pleural effusion from prior exam. Pulmonary opacities on the left with upper lobe nodules. Pulmonary nodules on the right on CT are not well visualized radiographically. No pneumothorax. IMPRESSION: 1. Left chest tube in place with decreased left pleural effusion. 2. Persistent opacities throughout the left hemithorax, some of which are nodular in correspond to nodules on CT. Electronically Signed   By: Keith Rake M.D.   On: 05/23/2019 05:06   ASSESSMENT AND PLAN:  63 y.o. male with history of solitary left kidney complicated by partial obstructing ureteral calculus status post emergency ureteral stent placement, MRSA bacteremia, and COPD admitted initially by PCCM for respiratory failure   Severe sepsiswith septic shock - MRSA bacteremia likely from previous central line which is removed Acute respiratory failure due to COPD exacerbation in the setting of Severe sepsis -present on admission Pyomyositis SP right-sided flank drain placement empyema SP thoracentesis. 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  Initially admitted to the ICU after intubation.  Patient was extubated and was transferred to the floor. Currently on 3 L of oxygen. Blood cultures came back positive for MRSA. Urine culture came back positive for VRE. 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 Infectious disease following the patient. Patient had left IJ placed on 05/12/2019 when he was bacteremic.  This line is removed now.    Urology was initially consulted as well which currently has signed off. General surgery is currently following up on the patient.  For empyema highly appreciate PCCM Dr. Patsey Berthold assistance for placement of chest tube placement.  Dr. Rich Number is considering to consult Dr. Genevive Bi cardiothoracic surgery for chest tube management  Discussed with [facility]surgery at [facility] with Dr. [name] who recommends conservative management with IV antibiotics as long as the patient does not have any symptoms.  Continue Teflaro and Zyvox per ID So far repeat blood cultures have been negative.  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   The stricture was dilated.   The stent was placed in the left ureter.   The urine culture on 05/11/2019 was negative.  A repeat urine culture was sent on 05/16/2019 and that is positive for VRE  VRE urinary tract infection -on Zyvox Known history of ESBL  A. fib/flutter with rapid ventricular rate -Currently in sinus tach with PVCs/PACs -was on amiodaron drip, for now on lopressor -Anticoagulation not recommended due to history of fall -Echo with normal LV function  Alcohol withdrawal syndrome -Monitor for withdrawal  Acute on chronic renal failure Solitary kidney Avoid nephrotoxins Maintain perfusion,avoid hypotension Check US renal  Paranoid schizophrenia Resume home medications as soon as feasible   Hypernatremia/hypokalemia Replete and recheck  DVT prophylaxis:heparin SQ  GI prophylax:PPI  All the records are reviewed and case discussed with Care  Management/Social Worker. Management plans discussed with the patient, nursing and they are in agreement.  CODE STATUS: Full Code  TOTAL TIME TAKING CARE OF THIS PATIENT: 35 minutes.   More than 50% of the time was spent in counseling/coordination of care: Cristie Hem M.D on 05/23/2019 at 6:52 PM  After 6pm go to [email or web address] - password [email or web address]  [facility]   CC: Primary care physician; [name], MD  Note: This dictation was prepared with Dragon dictation along with smaller phrase technology. Any transcriptional errors that result from this process are unintentional.

## 2019-05-23 NOTE — Consult Note (Signed)
PHARMACY CONSULT NOTE - FOLLOW UP  Pharmacy Consult for Electrolyte Monitoring and Replacement   Recent Labs: Potassium (mmol/L)  Date Value  05/23/2019 5.1  09/19/2014 3.7   Magnesium (mg/dL)  Date Value  05/22/2019 2.4  12/08/2012 1.8   Calcium (mg/dL)  Date Value  05/23/2019 7.9 (L)   Calcium, Total (mg/dL)  Date Value  09/19/2014 8.9   Albumin (g/dL)  Date Value  05/23/2019 1.5 (L)  09/19/2014 3.5   Phosphorus (mg/dL)  Date Value  05/23/2019 5.1 (H)   Sodium (mmol/L)  Date Value  05/23/2019 139  09/19/2014 59   63 year old male admitted with MRSA bacteremia with a past medical history of asthma, COPD, depression, gastric ulcer, GERD, hypothyroid, MI, stroke, schizophrenia, and seizure.  He has a complicated urological history as well, as he only possesses one kidney with emergent stent placement this admission.    Goal of Therapy:  All electrolytes wnl  Plan:  No electrolyte supplementation. Renal function improving. Will continue to follow electrolyte trend. BMP with morning labs, Mg/phos q48h unless trending down.   Pharmacy will continue to monitor and adjust per consult.   Tawnya Crook, PharmD  05/23/2019 11:38 AM

## 2019-05-23 NOTE — Progress Notes (Signed)
Date of Admission:  05/11/2019     In and out of ICU because of agitation/SOB Had left chest tube placed   Subjective: Says he is feeling better No fever Some pain at the drain sites  Medications:  . chlorhexidine  15 mL Mouth Rinse BID  . Chlorhexidine Gluconate Cloth  6 each Topical Daily  . feeding supplement (ENSURE ENLIVE)  237 mL Oral TID BM  . ipratropium-albuterol  3 mL Nebulization TID  . mouth rinse  15 mL Mouth Rinse q12n4p  . metoprolol tartrate  25 mg Oral BID  . multivitamin  15 mL Oral Daily  . nicotine  21 mg Transdermal Daily  . thiamine injection  100 mg Intravenous Q24H    Objective: Vital signs in last 24 hours: Temp:  [98.1 F (36.7 C)-99.2 F (37.3 C)] 98.1 F (36.7 C) (12/21 0400) Pulse Rate:  [66-106] 69 (12/21 1000) Resp:  [12-31] 12 (12/21 1000) BP: (97-161)/(63-91) 122/77 (12/21 1000) SpO2:  [94 %-97 %] 97 % (12/21 1000) Weight:  [87.7 kg-90.7 kg] 90.7 kg (12/21 0500)  PHYSICAL EXAM:  General: Alert, cooperative, disheveled Head: Normocephalic, without obvious abnormality, atraumatic. Eyes: Conjunctivae clear, anicteric sclerae. Pupils are equal Neck: Supple, Lungs: b/l air entry- decreased left base Drain present Heart: irregular Abdomen: Soft, rt flank drain  sounds normal. No masses Extremities: atraumatic, no cyanosis. No edema. No clubbing Skin: No rashes or lesions. Or bruising Lymph: Cervical, supraclavicular normal. Neurologic: Grossly non-focal  Lab Results Recent Labs    05/22/19 0545 05/22/19 1639 05/23/19 0418  WBC 14.8*  --  13.2*  HGB 8.9*  --  8.0*  HCT 28.0*  --  26.5*  NA 140 141 139  K 5.3* 5.3* 5.1  CL 111 111 111  CO2 22 24 18*  BUN 68* 60* 57*  CREATININE 2.98* 2.92* 2.85*   Liver Panel Recent Labs    05/22/19 1639 05/23/19 0418  PROT 6.4*  --   ALBUMIN 1.6* 1.5*  AST 37  --   ALT 26  --   ALKPHOS 181*  --   BILITOT 1.5*  --    Sedimentation Rate No results for input(s): ESRSEDRATE in the  last 72 hours. C-Reactive Protein No results for input(s): CRP in the last 72 hours.  Microbiology:  Studies/Results: DG Skull 1-3 Views  Result Date: 05/21/2019 CLINICAL DATA:  Pre MRI clearance. EXAM: SKULL - 1-3 VIEW COMPARISON:  CT head 08/20/2018. FINDINGS: The skull is intact. There is no evidence of acute fracture or focal lesion. There are no radiopaque foreign bodies. Pineal region calcifications are noted. The visualized paranasal sinuses are clear. IMPRESSION: No evidence of intracranial radiopaque foreign body acute osseous findings. Electronically Signed   By: Richardean Sale M.D.   On: 05/21/2019 13:54   DG Chest 1 View  Result Date: 05/21/2019 CLINICAL DATA:  Increasing shortness of breath. Smoker. History of asthma/COPD. EXAM: CHEST  1 VIEW COMPARISON:  Radiographs 05/19/2019 and 05/15/2019. CT 05/15/2019. FINDINGS: 1324 hours. Left pleural effusion has partially reaccumulated and appears loculated laterally. The underlying cavitary lesions in the left lung are partially obscured, although grossly stable. There is increased compressive left lung atelectasis. The right lung is clear. There is no pneumothorax. The heart size and mediastinal contours are stable. IMPRESSION: 1. Interval partial reaccumulation of loculated left pleural effusion. 2. No pneumothorax. Electronically Signed   By: Richardean Sale M.D.   On: 05/21/2019 13:58   CT HEAD WO CONTRAST  Result Date: 05/22/2019 CLINICAL DATA:  Encephalopathy. Hallucinations. EXAM: CT HEAD WITHOUT CONTRAST TECHNIQUE: Contiguous axial images were obtained from the base of the skull through the vertex without intravenous contrast. COMPARISON:  08/20/2018 FINDINGS: Brain: There is no evidence of acute infarct, intracranial hemorrhage, mass, midline shift, or extra-axial fluid collection. Mild cerebral atrophy is unchanged and likely within normal limits for age. Low level hypoattenuation in the cerebral white matter is nonspecific but  may reflect mild chronic small vessel ischemic disease. Vascular: Calcified atherosclerosis at the skull base. No hyperdense vessel. Skull: No fracture or focal osseous lesion. Sinuses/Orbits: Visualized paranasal sinuses and mastoid air cells are clear. Orbits are unremarkable. Other: None. IMPRESSION: No evidence of acute intracranial abnormality. Electronically Signed   By: Logan Bores M.D.   On: 05/22/2019 15:26   CT CHEST WO CONTRAST  Result Date: 05/22/2019 CLINICAL DATA:  63 year old male with increased shortness of breath, known septic emboli EXAM: CT CHEST WITHOUT CONTRAST TECHNIQUE: Multidetector CT imaging of the chest was performed following the standard protocol without IV contrast. COMPARISON:  Prior CT scan of the chest 05/15/2019 FINDINGS: Cardiovascular: Two vessel aortic arch. The right brachiocephalic and left common carotid artery share a common origin. The aorta is normal in caliber. The main pulmonary artery is normal in caliber. The heart is normal in size. No pericardial effusion. Mediastinum/Nodes: Unremarkable CT appearance of the thyroid gland. No suspicious mediastinal or hilar adenopathy. No soft tissue mediastinal mass. The thoracic esophagus is unremarkable. Lungs/Pleura: Approximately 1.1 cm left upper lobe pulmonary nodule is similar in size compared to prior but with decreased central cavitation. Left-sided pulmonary nodule measures 2.5 cm in diameter. Numerous additional scattered pulmonary nodules as previously seen. Increased subsegmental atelectasis in the right lung base. No evidence of pulmonary edema. Enlarging left pleural effusion with associated left lower lobe atelectasis. Upper Abdomen: Circumscribed 10.4 cm low-attenuation cystic lesion exophytic from the left lobe of the liver. There is a smaller 2.4 cm cyst arising from the caudate lobe of the liver and an additional low-attenuation lesion posteriorly in hepatic segment 6 which measures 2 cm. All lesions are  essentially unchanged compared to the recent prior imaging. Musculoskeletal: No acute fracture or aggressive appearing lytic or blastic osseous lesion. IMPRESSION: 1. Persistent bilateral pulmonary nodules with slightly decreased cavitation compared to the recent prior imaging. Findings remain most consistent with bilateral septic pulmonary emboli. 2. Enlarging left-sided pleural effusion with left lower lobe atelectasis. 3. Slightly increased right lower lobe atelectasis as well. 4. No evidence of pulmonary edema. 5. Additional ancillary findings as above without significant interval change. Electronically Signed   By: Jacqulynn Cadet M.D.   On: 05/22/2019 15:33   MR THORACIC SPINE WO CONTRAST  Result Date: 05/21/2019 CLINICAL DATA:  Sepsis. Irrigation and drainage of right flank abscess yesterday. EXAM: MRI THORACIC AND LUMBAR SPINE WITHOUT CONTRAST TECHNIQUE: Multiplanar and multiecho pulse sequences of the thoracic and lumbar spine were obtained without intravenous contrast. COMPARISON:  Abdominopelvic CT 05/18/2019. Chest CT 05/15/2019. FINDINGS: MRI THORACIC SPINE FINDINGS Alignment:  Normal. Vertebrae: There is a hemangioma within the left aspect of the T7 vertebral body. No evidence of acute fracture, subluxation, discitis or osteomyelitis. The localizing images demonstrate spondylosis in the lower cervical spine without apparent acute abnormality. Cord: The thoracic cord appears normal in signal and caliber. Conus medullaris extends to the L1 level. Paraspinal and other soft tissues: No focal paraspinal abnormalities are identified. There are left-greater-than-right pleural effusions with dependent airspace opacities bilaterally. There are nodular components in both lungs, also similar  to previous CT. Disc levels: Detail is mildly degraded by motion artifact. No large disc herniations are demonstrated. There is a small right paracentral disc protrusion at T6-7. There is a slightly larger right  paracentral disc protrusion at T7-8. No cord deformity or significant foraminal narrowing. MRI LUMBAR SPINE FINDINGS Segmentation: There is transitional lumbosacral anatomy. As numbered from above, there is a transitional S1 segment with a vestigial S1-2 disc space. Alignment:  Normal. Vertebrae: There is no evidence of discitis or vertebral body osteomyelitis. However, the right L4-5 facet joint is widened with a joint effusion and surrounding soft tissue edema. There is a posterior epidural fluid collection which extends superiorly from the L4-5 facet joint to the upper L3 level. On the sagittal images, this extends approximately 7.3 cm in length (image 9/1). This fluid collection measures up to 14 x 9 mm transverse (image 22/4) and is highly suspicious for an epidural abscess. There is some mass effect on the thecal sac. No apparent cortical destruction of the posterior elements. Conus medullaris and cauda equina: Conus extends to the L1 level. Paraspinal and other soft tissues: As above, there are paraspinal inflammatory changes around the right L4-5 facet joint with a posterior epidural fluid collection suspicious for an abscess. No paraspinal abscesses are identified. Disc levels: No significant disc space findings at or above L3-4. As described above, the posterior epidural abscess extends superiorly to the level of the upper L3 vertebral body. L4-5: Mild disc bulging. As above, asymmetric right facet joint effusion with paraspinal and posterior right epidural fluid collections highly worrisome for infection. No significant foraminal compromise. L5-S1: Loss of disc height with annular disc bulging and a broad-based central disc protrusion. Mild narrowing of the right lateral recess and mild bilateral facet hypertrophy. The foramina are sufficiently patent. S1-2: Transitional disc space level appears unremarkable. IMPRESSION: 1. Transitional lumbosacral anatomy. As numbered from above, there is a transitional  S1 segment with a vestigial S1-2 disc space. 2. Probable septic right L4-5 facet joint with paraspinal inflammatory changes and a posterior epidural abscess extending superiorly from the L4-5 facet joint to the upper L3 level. There is some mass effect on the thecal sac. 3. No paraspinal abscesses identified. 4. No evidence of discitis or vertebral body osteomyelitis. 5. Bilateral pleural effusions with nodular components bilaterally consistent with septic emboli, similar to previous CT. 6. These results were called by telephone at the time of interpretation on 05/21/2019 at 2:45 pm to provider [facility] PATEL , who verbally acknowledged these results. Electronically Signed   By: Richardean Sale M.D.   On: 05/21/2019 14:50   MR LUMBAR SPINE WO CONTRAST  Result Date: 05/21/2019 CLINICAL DATA:  Sepsis. Irrigation and drainage of right flank abscess yesterday. EXAM: MRI THORACIC AND LUMBAR SPINE WITHOUT CONTRAST TECHNIQUE: Multiplanar and multiecho pulse sequences of the thoracic and lumbar spine were obtained without intravenous contrast. COMPARISON:  Abdominopelvic CT 05/18/2019. Chest CT 05/15/2019. FINDINGS: MRI THORACIC SPINE FINDINGS Alignment:  Normal. Vertebrae: There is a hemangioma within the left aspect of the T7 vertebral body. No evidence of acute fracture, subluxation, discitis or osteomyelitis. The localizing images demonstrate spondylosis in the lower cervical spine without apparent acute abnormality. Cord: The thoracic cord appears normal in signal and caliber. Conus medullaris extends to the L1 level. Paraspinal and other soft tissues: No focal paraspinal abnormalities are identified. There are left-greater-than-right pleural effusions with dependent airspace opacities bilaterally. There are nodular components in both lungs, also similar to previous CT. Disc levels: Detail is mildly  degraded by motion artifact. No large disc herniations are demonstrated. There is a small right paracentral disc  protrusion at T6-7. There is a slightly larger right paracentral disc protrusion at T7-8. No cord deformity or significant foraminal narrowing. MRI LUMBAR SPINE FINDINGS Segmentation: There is transitional lumbosacral anatomy. As numbered from above, there is a transitional S1 segment with a vestigial S1-2 disc space. Alignment:  Normal. Vertebrae: There is no evidence of discitis or vertebral body osteomyelitis. However, the right L4-5 facet joint is widened with a joint effusion and surrounding soft tissue edema. There is a posterior epidural fluid collection which extends superiorly from the L4-5 facet joint to the upper L3 level. On the sagittal images, this extends approximately 7.3 cm in length (image 9/1). This fluid collection measures up to 14 x 9 mm transverse (image 22/4) and is highly suspicious for an epidural abscess. There is some mass effect on the thecal sac. No apparent cortical destruction of the posterior elements. Conus medullaris and cauda equina: Conus extends to the L1 level. Paraspinal and other soft tissues: As above, there are paraspinal inflammatory changes around the right L4-5 facet joint with a posterior epidural fluid collection suspicious for an abscess. No paraspinal abscesses are identified. Disc levels: No significant disc space findings at or above L3-4. As described above, the posterior epidural abscess extends superiorly to the level of the upper L3 vertebral body. L4-5: Mild disc bulging. As above, asymmetric right facet joint effusion with paraspinal and posterior right epidural fluid collections highly worrisome for infection. No significant foraminal compromise. L5-S1: Loss of disc height with annular disc bulging and a broad-based central disc protrusion. Mild narrowing of the right lateral recess and mild bilateral facet hypertrophy. The foramina are sufficiently patent. S1-2: Transitional disc space level appears unremarkable. IMPRESSION: 1. Transitional lumbosacral  anatomy. As numbered from above, there is a transitional S1 segment with a vestigial S1-2 disc space. 2. Probable septic right L4-5 facet joint with paraspinal inflammatory changes and a posterior epidural abscess extending superiorly from the L4-5 facet joint to the upper L3 level. There is some mass effect on the thecal sac. 3. No paraspinal abscesses identified. 4. No evidence of discitis or vertebral body osteomyelitis. 5. Bilateral pleural effusions with nodular components bilaterally consistent with septic emboli, similar to previous CT. 6. These results were called by telephone at the time of interpretation on 05/21/2019 at 2:45 pm to provider [facility] PATEL , who verbally acknowledged these results. Electronically Signed   By: Richardean Sale M.D.   On: 05/21/2019 14:50   DG Chest Port 1 View  Result Date: 05/23/2019 CLINICAL DATA:  Empyema. EXAM: PORTABLE CHEST 1 VIEW COMPARISON:  Radiograph yesterday. CT yesterday. FINDINGS: Left-sided chest tube with tip directed laterally towards the upper lung zone. Decreased left pleural effusion from prior exam. Pulmonary opacities on the left with upper lobe nodules. Pulmonary nodules on the right on CT are not well visualized radiographically. No pneumothorax. IMPRESSION: 1. Left chest tube in place with decreased left pleural effusion. 2. Persistent opacities throughout the left hemithorax, some of which are nodular in correspond to nodules on CT. Electronically Signed   By: Keith Rake M.D.   On: 05/23/2019 05:06   DG Chest Port 1 View  Result Date: 05/22/2019 CLINICAL DATA:  Status post left chest tube placement EXAM: PORTABLE CHEST 1 VIEW COMPARISON:  May 22, 2019 FINDINGS: A left chest tube has been placed in the interval. The left-sided pleural effusion remains but is smaller. There may  be some air in the pleural space on the left, introduced from the chest tube placement. No other evidence of pneumothorax. No other acute abnormalities.  Opacity underlying the effusion remains. IMPRESSION: A left-sided chest tube has been placed and the left-sided pleural effusion remains but is smaller. There may be some air in the left pleural space, introduced from the chest tube placement. Opacity underlying the left-sided effusion remains, likely atelectasis. Electronically Signed   By: Dorise Bullion III M.D   On: 05/22/2019 18:39   DG Chest Port 1 View  Result Date: 05/22/2019 CLINICAL DATA:  Shortness of breath EXAM: PORTABLE CHEST 1 VIEW COMPARISON:  05/21/2019 FINDINGS: Parents slight increase in moderate to large loculated LEFT pleural effusion noted with increasing LEFT lung opacities/atelectasis. Slightly increasing mild interstitial opacities within the RIGHT lung noted. No evidence of pneumothorax. No other significant change. IMPRESSION: 1. Slight increase in moderate to large loculated LEFT pleural effusion with increasing LEFT lung opacities/atelectasis. 2. Slightly increasing mild interstitial opacities within the RIGHT lung which may represent edema or infection. Electronically Signed   By: Margarette Canada M.D.   On: 05/22/2019 14:56     Assessment/Plan: MRSA bacteremia with disseminated infection  Septic emboli with cavitary lesions lungs with left empyema ( eventhough no endocarditis on TEE he could have had a central line thrombus which could have detached on removal of the central line on admission and caused septic emboli to the lungs Has a left chest drain   Pt currently on linezolid and ceftaroline Rt flank submuscular abscess s/p I/D  L4-L5 facet joint septic arthritis with a posterior epidural fluid collection. Will discuss with IR regarding aspiration feasibility  AKi on CKD  History of left hydronephrosis, at one time had percutaneous nephrostomy, stent, complicated by retroperitoneal abscess due to E. coli and Enterococcus and had been treated at [facility]. History of ESBL E. 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

## 2019-05-23 NOTE — Progress Notes (Signed)
[facility] Day(s): 12.   Post op day(s): 3 Days Post-Op.   Interval History:  Patient seen and examined  no acute events or new complaints overnight.  Patient reports he is doing well. No complaints of pain. States "im just laying here." Leukocytosis improving, 13K this morning, no fevers Cultures growing staph aureus, susceptibilities pending  Vital signs in last 24 hours: [min-max] current  Temp:  [98.1 F (36.7 C)-99.2 F (37.3 C)] 98.1 F (36.7 C) (12/21 0400) Pulse Rate:  [66-106] 74 (12/21 0600) Resp:  [13-31] 20 (12/21 0400) BP: (97-161)/(63-79) 126/75 (12/21 0600) SpO2:  [94 %-97 %] 97 % (12/21 0600) Weight:  [87.7 kg-90.7 kg] 90.7 kg (12/21 0500)     Height: 6' (182.9 cm) Weight: 90.7 kg BMI (Calculated): 27.11   Intake/Output last 2 shifts:  12/20 0701 - 12/21 0700 In: 370 [P.O.:120; IV Piggyback:250] Out: 1950 [Urine:745; Drains:675; Chest Tube:530]   Physical Exam:  Constitutional: alert, cooperative and no distress  Respiratory: breathing non-labored at rest  Cardiovascular: regular rate and sinus rhythm  Chest: Incision right lateral chest wall/flank is intact with staples, erythema improved, there is drainage on honeycomb, Chest tube present in left chest wall.    Labs:  CBC Latest Ref Rng & Units 05/23/2019 05/22/2019 05/21/2019  WBC 4.0 - 10.5 K/uL 13.2(H) 14.8(H) 16.5(H)  Hemoglobin 13.0 - 17.0 g/dL 8.0(L) 8.9(L) 8.9(L)  Hematocrit 39.0 - 52.0 % 26.5(L) 28.0(L) 27.3(L)  Platelets 150 - 400 K/uL 378 365 327   CMP Latest Ref Rng & Units 05/23/2019 05/22/2019 05/22/2019  Glucose 70 - 99 mg/dL 94 98 124(H)  BUN 8 - 23 mg/dL 57(H) 60(H) 68(H)  Creatinine 0.61 - 1.24 mg/dL 2.85(H) 2.92(H) 2.98(H)  Sodium 135 - 145 mmol/L 139 141 140  Potassium 3.5 - 5.1 mmol/L 5.1 5.3(H) 5.3(H)  Chloride 98 - 111 mmol/L 111 111 111  CO2 22 - 32 mmol/L 18(L) 24 22  Calcium 8.9 - 10.3 mg/dL 7.9(L) 8.0(L) 7.8(L)  Total  Protein 6.5 - 8.1 g/dL - 6.4(L) -  Total Bilirubin 0.3 - 1.2 mg/dL - 1.5(H) -  Alkaline Phos 38 - 126 U/L - 181(H) -  AST 15 - 41 U/L - 37 -  ALT 0 - 44 U/L - 26 -    Imaging studies: No new pertinent imaging studies   Assessment/Plan:  63 y.o. male 3 Days Post-Op s/p incision and drainage for purulent RLQ myositis with abscess, complicated by pertinent comorbidities including multiple comorbid conditions.   - Continue IV Abx (Ceftaroline + Linezolid); Cx with rare staph aureus; susceptibilities pending; ID folowing   - Monitor leukocytosis; fever curve  - pain control prn  - No further surgical re-intervention; we will follow  All of the above findings and recommendations were discussed with the patient, and the medical team, and all of patient's questions were answered to his expressed satisfaction.  -- Edison Simon, PA-C [facility] 05/23/2019, 7:42 AM [phone] M-F: 7am - 4pm

## 2019-05-23 NOTE — Progress Notes (Signed)
SLP  Note  Patient Details Name: Alan Small MRN: [id number] DOB: 1955/12/04   Pt moved off floor to ICU following emergent surgery for ureter stent. Nsg reports toleration of current Dysphagia I diet with thin liquids w/no overt s/s aspiration. Nsg reports appetite is decreased eating small portion of breakfast this morning. SLP to continue to follow for toleration of diet and offer trials of upgraded consistency as appropriate and as pt's medical status stabilizes.   Rohail Klees, MA, CCC-SLP 05/23/2019, 2:40 PM

## 2019-05-24 ENCOUNTER — Inpatient Hospital Stay (HOSPITAL_COMMUNITY)
Admission: AD | Admit: 2019-05-24 | Discharge: 2019-07-04 | DRG: 853 | Disposition: E | Payer: Medicare Other | Source: Other Acute Inpatient Hospital | Attending: Internal Medicine | Admitting: Internal Medicine

## 2019-05-24 ENCOUNTER — Inpatient Hospital Stay: Payer: Medicare Other

## 2019-05-24 DIAGNOSIS — J9601 Acute respiratory failure with hypoxia: Secondary | ICD-10-CM | POA: Diagnosis present

## 2019-05-24 DIAGNOSIS — D631 Anemia in chronic kidney disease: Secondary | ICD-10-CM | POA: Diagnosis present

## 2019-05-24 DIAGNOSIS — F411 Generalized anxiety disorder: Secondary | ICD-10-CM | POA: Diagnosis not present

## 2019-05-24 DIAGNOSIS — I484 Atypical atrial flutter: Secondary | ICD-10-CM | POA: Diagnosis not present

## 2019-05-24 DIAGNOSIS — Z66 Do not resuscitate: Secondary | ICD-10-CM | POA: Diagnosis not present

## 2019-05-24 DIAGNOSIS — K7689 Other specified diseases of liver: Secondary | ICD-10-CM | POA: Diagnosis present

## 2019-05-24 DIAGNOSIS — E875 Hyperkalemia: Secondary | ICD-10-CM | POA: Diagnosis present

## 2019-05-24 DIAGNOSIS — E870 Hyperosmolality and hypernatremia: Secondary | ICD-10-CM | POA: Diagnosis present

## 2019-05-24 DIAGNOSIS — I76 Septic arterial embolism: Secondary | ICD-10-CM | POA: Diagnosis present

## 2019-05-24 DIAGNOSIS — Z7189 Other specified counseling: Secondary | ICD-10-CM | POA: Diagnosis not present

## 2019-05-24 DIAGNOSIS — F17210 Nicotine dependence, cigarettes, uncomplicated: Secondary | ICD-10-CM | POA: Diagnosis not present

## 2019-05-24 DIAGNOSIS — G062 Extradural and subdural abscess, unspecified: Secondary | ICD-10-CM | POA: Diagnosis present

## 2019-05-24 DIAGNOSIS — J939 Pneumothorax, unspecified: Secondary | ICD-10-CM

## 2019-05-24 DIAGNOSIS — E43 Unspecified severe protein-calorie malnutrition: Secondary | ICD-10-CM | POA: Diagnosis not present

## 2019-05-24 DIAGNOSIS — N189 Chronic kidney disease, unspecified: Secondary | ICD-10-CM | POA: Diagnosis not present

## 2019-05-24 DIAGNOSIS — J869 Pyothorax without fistula: Secondary | ICD-10-CM | POA: Diagnosis present

## 2019-05-24 DIAGNOSIS — K648 Other hemorrhoids: Secondary | ICD-10-CM | POA: Diagnosis present

## 2019-05-24 DIAGNOSIS — Z79890 Hormone replacement therapy (postmenopausal): Secondary | ICD-10-CM

## 2019-05-24 DIAGNOSIS — N184 Chronic kidney disease, stage 4 (severe): Secondary | ICD-10-CM | POA: Diagnosis present

## 2019-05-24 DIAGNOSIS — Z960 Presence of urogenital implants: Secondary | ICD-10-CM | POA: Diagnosis present

## 2019-05-24 DIAGNOSIS — D62 Acute posthemorrhagic anemia: Secondary | ICD-10-CM | POA: Diagnosis not present

## 2019-05-24 DIAGNOSIS — Z9049 Acquired absence of other specified parts of digestive tract: Secondary | ICD-10-CM

## 2019-05-24 DIAGNOSIS — I351 Nonrheumatic aortic (valve) insufficiency: Secondary | ICD-10-CM | POA: Diagnosis not present

## 2019-05-24 DIAGNOSIS — M4656 Other infective spondylopathies, lumbar region: Secondary | ICD-10-CM | POA: Diagnosis not present

## 2019-05-24 DIAGNOSIS — E872 Acidosis: Secondary | ICD-10-CM | POA: Diagnosis present

## 2019-05-24 DIAGNOSIS — I4891 Unspecified atrial fibrillation: Secondary | ICD-10-CM | POA: Diagnosis present

## 2019-05-24 DIAGNOSIS — Z23 Encounter for immunization: Secondary | ICD-10-CM | POA: Diagnosis present

## 2019-05-24 DIAGNOSIS — I252 Old myocardial infarction: Secondary | ICD-10-CM

## 2019-05-24 DIAGNOSIS — K651 Peritoneal abscess: Secondary | ICD-10-CM | POA: Diagnosis not present

## 2019-05-24 DIAGNOSIS — B961 Klebsiella pneumoniae [K. pneumoniae] as the cause of diseases classified elsewhere: Secondary | ICD-10-CM | POA: Diagnosis not present

## 2019-05-24 DIAGNOSIS — Z1624 Resistance to multiple antibiotics: Secondary | ICD-10-CM | POA: Diagnosis present

## 2019-05-24 DIAGNOSIS — M60009 Infective myositis, unspecified site: Secondary | ICD-10-CM | POA: Diagnosis present

## 2019-05-24 DIAGNOSIS — F205 Residual schizophrenia: Secondary | ICD-10-CM | POA: Diagnosis not present

## 2019-05-24 DIAGNOSIS — J90 Pleural effusion, not elsewhere classified: Secondary | ICD-10-CM

## 2019-05-24 DIAGNOSIS — R918 Other nonspecific abnormal finding of lung field: Secondary | ICD-10-CM | POA: Diagnosis not present

## 2019-05-24 DIAGNOSIS — Z9181 History of falling: Secondary | ICD-10-CM

## 2019-05-24 DIAGNOSIS — E861 Hypovolemia: Secondary | ICD-10-CM | POA: Diagnosis present

## 2019-05-24 DIAGNOSIS — Z885 Allergy status to narcotic agent status: Secondary | ICD-10-CM | POA: Diagnosis not present

## 2019-05-24 DIAGNOSIS — A4102 Sepsis due to Methicillin resistant Staphylococcus aureus: Secondary | ICD-10-CM | POA: Diagnosis present

## 2019-05-24 DIAGNOSIS — I129 Hypertensive chronic kidney disease with stage 1 through stage 4 chronic kidney disease, or unspecified chronic kidney disease: Secondary | ICD-10-CM | POA: Diagnosis present

## 2019-05-24 DIAGNOSIS — F200 Paranoid schizophrenia: Secondary | ICD-10-CM | POA: Diagnosis present

## 2019-05-24 DIAGNOSIS — Y832 Surgical operation with anastomosis, bypass or graft as the cause of abnormal reaction of the patient, or of later complication, without mention of misadventure at the time of the procedure: Secondary | ICD-10-CM | POA: Diagnosis not present

## 2019-05-24 DIAGNOSIS — Z888 Allergy status to other drugs, medicaments and biological substances status: Secondary | ICD-10-CM

## 2019-05-24 DIAGNOSIS — R4189 Other symptoms and signs involving cognitive functions and awareness: Secondary | ICD-10-CM | POA: Diagnosis not present

## 2019-05-24 DIAGNOSIS — F209 Schizophrenia, unspecified: Secondary | ICD-10-CM | POA: Diagnosis not present

## 2019-05-24 DIAGNOSIS — B9562 Methicillin resistant Staphylococcus aureus infection as the cause of diseases classified elsewhere: Secondary | ICD-10-CM | POA: Diagnosis present

## 2019-05-24 DIAGNOSIS — F1010 Alcohol abuse, uncomplicated: Secondary | ICD-10-CM | POA: Diagnosis not present

## 2019-05-24 DIAGNOSIS — Z79899 Other long term (current) drug therapy: Secondary | ICD-10-CM

## 2019-05-24 DIAGNOSIS — I4892 Unspecified atrial flutter: Secondary | ICD-10-CM | POA: Diagnosis present

## 2019-05-24 DIAGNOSIS — R6521 Severe sepsis with septic shock: Secondary | ICD-10-CM | POA: Diagnosis present

## 2019-05-24 DIAGNOSIS — I2690 Septic pulmonary embolism without acute cor pulmonale: Secondary | ICD-10-CM | POA: Diagnosis present

## 2019-05-24 DIAGNOSIS — A419 Sepsis, unspecified organism: Secondary | ICD-10-CM | POA: Diagnosis not present

## 2019-05-24 DIAGNOSIS — K529 Noninfective gastroenteritis and colitis, unspecified: Secondary | ICD-10-CM | POA: Diagnosis not present

## 2019-05-24 DIAGNOSIS — J441 Chronic obstructive pulmonary disease with (acute) exacerbation: Secondary | ICD-10-CM | POA: Diagnosis present

## 2019-05-24 DIAGNOSIS — Z87448 Personal history of other diseases of urinary system: Secondary | ICD-10-CM | POA: Diagnosis not present

## 2019-05-24 DIAGNOSIS — G8929 Other chronic pain: Secondary | ICD-10-CM | POA: Diagnosis present

## 2019-05-24 DIAGNOSIS — Z95828 Presence of other vascular implants and grafts: Secondary | ICD-10-CM | POA: Diagnosis not present

## 2019-05-24 DIAGNOSIS — G061 Intraspinal abscess and granuloma: Secondary | ICD-10-CM | POA: Diagnosis present

## 2019-05-24 DIAGNOSIS — N136 Pyonephrosis: Secondary | ICD-10-CM | POA: Diagnosis present

## 2019-05-24 DIAGNOSIS — R52 Pain, unspecified: Secondary | ICD-10-CM | POA: Diagnosis not present

## 2019-05-24 DIAGNOSIS — D509 Iron deficiency anemia, unspecified: Secondary | ICD-10-CM | POA: Diagnosis present

## 2019-05-24 DIAGNOSIS — Z808 Family history of malignant neoplasm of other organs or systems: Secondary | ICD-10-CM

## 2019-05-24 DIAGNOSIS — R432 Parageusia: Secondary | ICD-10-CM | POA: Diagnosis not present

## 2019-05-24 DIAGNOSIS — Z936 Other artificial openings of urinary tract status: Secondary | ICD-10-CM | POA: Diagnosis not present

## 2019-05-24 DIAGNOSIS — Z9689 Presence of other specified functional implants: Secondary | ICD-10-CM

## 2019-05-24 DIAGNOSIS — R7881 Bacteremia: Secondary | ICD-10-CM | POA: Diagnosis not present

## 2019-05-24 DIAGNOSIS — Z09 Encounter for follow-up examination after completed treatment for conditions other than malignant neoplasm: Secondary | ICD-10-CM

## 2019-05-24 DIAGNOSIS — N179 Acute kidney failure, unspecified: Secondary | ICD-10-CM

## 2019-05-24 DIAGNOSIS — Z905 Acquired absence of kidney: Secondary | ICD-10-CM

## 2019-05-24 DIAGNOSIS — Z515 Encounter for palliative care: Secondary | ICD-10-CM | POA: Diagnosis not present

## 2019-05-24 DIAGNOSIS — Z8719 Personal history of other diseases of the digestive system: Secondary | ICD-10-CM

## 2019-05-24 DIAGNOSIS — Z825 Family history of asthma and other chronic lower respiratory diseases: Secondary | ICD-10-CM

## 2019-05-24 DIAGNOSIS — M25562 Pain in left knee: Secondary | ICD-10-CM | POA: Diagnosis present

## 2019-05-24 DIAGNOSIS — Z4682 Encounter for fitting and adjustment of non-vascular catheter: Secondary | ICD-10-CM

## 2019-05-24 DIAGNOSIS — E039 Hypothyroidism, unspecified: Secondary | ICD-10-CM | POA: Diagnosis present

## 2019-05-24 DIAGNOSIS — R6520 Severe sepsis without septic shock: Secondary | ICD-10-CM

## 2019-05-24 DIAGNOSIS — N200 Calculus of kidney: Secondary | ICD-10-CM | POA: Diagnosis not present

## 2019-05-24 DIAGNOSIS — Z823 Family history of stroke: Secondary | ICD-10-CM

## 2019-05-24 DIAGNOSIS — J449 Chronic obstructive pulmonary disease, unspecified: Secondary | ICD-10-CM | POA: Diagnosis present

## 2019-05-24 DIAGNOSIS — Z8673 Personal history of transient ischemic attack (TIA), and cerebral infarction without residual deficits: Secondary | ICD-10-CM

## 2019-05-24 DIAGNOSIS — Z8619 Personal history of other infectious and parasitic diseases: Secondary | ICD-10-CM

## 2019-05-24 LAB — RENAL FUNCTION PANEL
Albumin: 1.5 g/dL — ABNORMAL LOW (ref 3.5–5.0)
Anion gap: 7 (ref 5–15)
BUN: 54 mg/dL — ABNORMAL HIGH (ref 8–23)
CO2: 20 mmol/L — ABNORMAL LOW (ref 22–32)
Calcium: 8.1 mg/dL — ABNORMAL LOW (ref 8.9–10.3)
Chloride: 111 mmol/L (ref 98–111)
Creatinine, Ser: 2.51 mg/dL — ABNORMAL HIGH (ref 0.61–1.24)
GFR calc Af Amer: 30 mL/min — ABNORMAL LOW (ref >60)
GFR calc non Af Amer: 26 mL/min — ABNORMAL LOW (ref >60)
Glucose, Bld: 89 mg/dL (ref 70–99)
Phosphorus: 4.3 mg/dL (ref 2.5–4.6)
Potassium: 5.2 mmol/L — ABNORMAL HIGH (ref 3.5–5.1)
Sodium: 138 mmol/L (ref 135–145)

## 2019-05-24 LAB — CBC
HCT: 25.5 % — ABNORMAL LOW (ref 39.0–52.0)
Hemoglobin: 8 g/dL — ABNORMAL LOW (ref 13.0–17.0)
MCH: 28.2 pg (ref 26.0–34.0)
MCHC: 31.4 g/dL (ref 30.0–36.0)
MCV: 89.8 fL (ref 80.0–100.0)
Platelets: 410 10*3/uL — ABNORMAL HIGH (ref 150–400)
RBC: 2.84 MIL/uL — ABNORMAL LOW (ref 4.22–5.81)
RDW: 15 % (ref 11.5–15.5)
WBC: 10.5 10*3/uL (ref 4.0–10.5)
nRBC: 0 % (ref 0.0–0.2)

## 2019-05-24 MED ORDER — OXYCODONE-ACETAMINOPHEN 5-325 MG PO TABS: 1.0000 | ORAL_TABLET | ORAL | 0 refills | Status: AC | PRN

## 2019-05-24 MED ORDER — ENSURE ENLIVE PO LIQD: 237.0000 mL | Freq: Three times a day (TID) | ORAL | 12 refills | Status: AC

## 2019-05-24 MED ORDER — METOPROLOL TARTRATE 25 MG PO TABS: 25.0000 mg | ORAL_TABLET | Freq: Two times a day (BID) | ORAL | Status: AC

## 2019-05-24 MED ORDER — ADULT MULTIVITAMIN W/MINERALS CH
1.0000 | ORAL_TABLET | Freq: Every day | ORAL | Status: DC
  Administered 2019-05-25 – 2019-06-07 (×14): 1 via ORAL
  Filled 2019-05-24 (×14): qty 1

## 2019-05-24 MED ORDER — METOPROLOL TARTRATE 25 MG PO TABS
25.0000 mg | ORAL_TABLET | Freq: Two times a day (BID) | ORAL | Status: DC
  Administered 2019-05-24 – 2019-06-07 (×26): 25 mg via ORAL
  Filled 2019-05-24 (×29): qty 1

## 2019-05-24 MED ORDER — ONDANSETRON HCL 4 MG PO TABS: 4.0000 mg | ORAL_TABLET | Freq: Four times a day (QID) | ORAL | Status: DC | PRN

## 2019-05-24 MED ORDER — ADULT MULTIVITAMIN LIQUID CH: 15.0000 mL | Freq: Every day | ORAL | Status: AC

## 2019-05-24 MED ORDER — SODIUM CHLORIDE 0.9 % IV SOLN: 400.0000 mg | Freq: Two times a day (BID) | INTRAVENOUS | Status: AC

## 2019-05-24 MED ORDER — LINEZOLID 600 MG/300ML IV SOLN: 600.0000 mg | Freq: Two times a day (BID) | INTRAVENOUS | Status: AC

## 2019-05-24 MED ORDER — SODIUM CHLORIDE 0.9 % IV SOLN
400.0000 mg | Freq: Two times a day (BID) | INTRAVENOUS | Status: DC
  Filled 2019-05-24 (×2): qty 400

## 2019-05-24 MED ORDER — NICOTINE 21 MG/24HR TD PT24: 21.0000 mg | MEDICATED_PATCH | Freq: Every day | TRANSDERMAL | 0 refills | Status: AC

## 2019-05-24 MED ORDER — NICOTINE 21 MG/24HR TD PT24
21.0000 mg | MEDICATED_PATCH | Freq: Every day | TRANSDERMAL | Status: DC
  Filled 2019-05-24 (×6): qty 1

## 2019-05-24 MED ORDER — ONDANSETRON HCL 4 MG/2ML IJ SOLN: 4.0000 mg | Freq: Four times a day (QID) | INTRAMUSCULAR | Status: DC | PRN

## 2019-05-24 MED ORDER — LORAZEPAM 2 MG/ML IJ SOLN
0.5000 mg | Freq: Four times a day (QID) | INTRAMUSCULAR | Status: DC | PRN
  Administered 2019-06-02 – 2019-06-12 (×2): 0.5 mg via INTRAVENOUS
  Filled 2019-05-24 (×2): qty 1

## 2019-05-24 MED ORDER — SODIUM CHLORIDE 0.9 % IV SOLN: 600.0000 mg | Freq: Two times a day (BID) | INTRAVENOUS | Status: DC

## 2019-05-24 MED ORDER — ACETAMINOPHEN 650 MG RE SUPP: 650.0000 mg | Freq: Four times a day (QID) | RECTAL | Status: DC | PRN

## 2019-05-24 MED ORDER — SODIUM CHLORIDE 0.9 % IV SOLN
400.0000 mg | Freq: Two times a day (BID) | INTRAVENOUS | Status: DC
  Administered 2019-05-25 (×2): 400 mg via INTRAVENOUS
  Filled 2019-05-24 (×3): qty 400

## 2019-05-24 MED ORDER — ENSURE ENLIVE PO LIQD: 237.0000 mL | Freq: Three times a day (TID) | ORAL | Status: DC

## 2019-05-24 MED ORDER — IPRATROPIUM-ALBUTEROL 0.5-2.5 (3) MG/3ML IN SOLN: 3.0000 mL | Freq: Three times a day (TID) | RESPIRATORY_TRACT | Status: AC

## 2019-05-24 MED ORDER — LEVOTHYROXINE SODIUM 50 MCG PO TABS
50.0000 ug | ORAL_TABLET | Freq: Every day | ORAL | Status: DC
  Administered 2019-05-25 – 2019-06-08 (×15): 50 ug via ORAL
  Filled 2019-05-24 (×15): qty 1

## 2019-05-24 MED ORDER — ENSURE ENLIVE PO LIQD
237.0000 mL | Freq: Three times a day (TID) | ORAL | Status: DC
  Administered 2019-05-26 – 2019-06-01 (×15): 237 mL via ORAL

## 2019-05-24 MED ORDER — IPRATROPIUM-ALBUTEROL 0.5-2.5 (3) MG/3ML IN SOLN
3.0000 mL | Freq: Three times a day (TID) | RESPIRATORY_TRACT | Status: DC
  Administered 2019-05-25 – 2019-05-27 (×6): 3 mL via RESPIRATORY_TRACT
  Filled 2019-05-24 (×5): qty 3

## 2019-05-24 MED ORDER — OXYCODONE-ACETAMINOPHEN 5-325 MG PO TABS
1.0000 | ORAL_TABLET | ORAL | Status: DC | PRN
  Administered 2019-05-24 – 2019-06-08 (×31): 1 via ORAL
  Filled 2019-05-24 (×31): qty 1

## 2019-05-24 MED ORDER — ACETAMINOPHEN 325 MG PO TABS: 650.0000 mg | ORAL_TABLET | Freq: Four times a day (QID) | ORAL | Status: DC | PRN

## 2019-05-24 MED ORDER — LORAZEPAM 2 MG/ML IJ SOLN: 0.5000 mg | Freq: Four times a day (QID) | INTRAMUSCULAR | 0 refills | Status: AC | PRN

## 2019-05-24 MED ORDER — LINEZOLID 600 MG/300ML IV SOLN
600.0000 mg | Freq: Two times a day (BID) | INTRAVENOUS | Status: DC
  Administered 2019-05-25 (×2): 600 mg via INTRAVENOUS
  Filled 2019-05-24 (×2): qty 300

## 2019-05-24 NOTE — H&P (Signed)
History and Physical    RANKIN COOLMAN WPY:[id number] DOB: [birth year] DOA: 05/25/2019  PCP: [name], MD  Patient coming from: [facility]  I have personally briefly reviewed patient's old medical records in [place]  Chief Complaint: Empyema, needs VATS  HPI: Alan Small is a 63 y.o. male with medical history significant of solitary left kidney, CKD stage IV, left staghorn calculus SP stenting, urethral stricture SP urethroplasty, schizophrenia, clinically diagnosed COPD, depression.  Patient was admitted at [facility] between February 10, 2019 and March 02, 2019.  Patient was treated for pyelonephritis and retroperitoneal fluid collection concerning for abscess.  Urine culture grew MDR E. coli.  Patient underwent aspiration of the abscess on 9/17 with JP drain placement.  Patient was started on IV imipenem with a PICC line.  Summary of his active problems in the hospital is as following.  Underwent left percutaneous nephrolithotomy 03/28/2019 and ureteral stent placement at the same time.  Underwent laser lithotripsy and basket extraction on 04/05/2019.  Patient was recommended to continue the antibiotics until April 16, 2019.  [facility] with shortness of breath left leg pain and fever and nausea and vomiting.  CT scan showed evidence of stone on the left side.  Patient was initially placed on the BiPAP.  His oxygenation worsened and patient actually required intubation in the emergency department.  Urology was consulted and patient underwent urgent cystoscopy and left ureteral stent placement.  Patient was started on broad-spectrum antibiotics and his blood cultures came back positive for MRSA.  Patient was extubated on 05/20/2019 and since then has remained on nasal cannula.  Patient's PICC line was removed on 05/12/2019 and a central line was placed which was removed on 05/18/2019.  Foley catheter was inserted on 05/11/2019 after the urology procedure  and removed on 05/18/2019. TEE showed no evidence of endocarditis but the patient does have evidence of septic emboli with cavitary lesions on bilateral lung parenchyma therefore patient is treated as infective endocarditis. 05/19/2019 left thoracentesis/pleural fluid culture came back positive for MRSA.  Report showed loculated effusion post procedure. 05/20/2019 had pyomyositis underwent right-sided drain placement after incision and debridement. 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  [facility] neurosurgery was consulted who recommended conservative management.  Dr. [name] was on call. Due to worsening shortness of breath CT chest performed on 05/22/2019 showed evidence of recollection of pleural fluid.  Chest tube was inserted by PCCM on 05/22/2019. Patient currently in the process of being transferred to [facility] for further evaluation by cardiothoracic surgery as patient is not a candidate for TPA in pleural space due to epidural abscess and may require decortication.  Review of Systems: As per HPI, otherwise all review of systems negative.  Past Medical History:  Diagnosis Date   Asthma    COPD (chronic obstructive pulmonary disease) ([place])    Depression    External hemorrhoids    Gastric ulcer    GERD (gastroesophageal reflux disease)    Hydronephrosis    Hypothyroidism    hypo   Insomnia    Mental disorder    Myocardial infarction ([place])    Paranoid schizophrenia ([place])    Renal disorder    renal failure   Seizures ([place])    Stroke [facility])     Past Surgical History:  Procedure Laterality Date   APPENDECTOMY  COLONOSCOPY WITH PROPOFOL N/A 12/07/2014   Procedure: COLONOSCOPY WITH PROPOFOL;  Surgeon: Manya Silvas, MD;  Location: [facility] ENDOSCOPY;  Service: Endoscopy;  Laterality: N/A;   COLONOSCOPY WITH PROPOFOL N/A  10/10/2016   Procedure: COLONOSCOPY WITH PROPOFOL;  Surgeon: Lucilla Lame, MD;  Location: [place];  Service: Endoscopy;  Laterality: N/A;   CYSTOSCOPY W/ URETERAL STENT PLACEMENT Left 01/21/2019   Procedure: CYSTOSCOPY WITH RETROGRADE PYELOGRAM/URETERAL STENT PLACEMENT;  Surgeon: Abbie Sons, MD;  Location: [facility] ORS;  Service: Urology;  Laterality: Left;   CYSTOSCOPY WITH URETEROSCOPY AND STENT PLACEMENT Left 05/11/2019   Procedure: CYSTOSCOPY WITH URETEROSCOPY AND STENT PLACEMENT, LEFT;  Surgeon: Hollice Espy, MD;  Location: [facility] ORS;  Service: Urology;  Laterality: Left;   ESOPHAGOGASTRODUODENOSCOPY N/A 12/07/2014   Procedure: ESOPHAGOGASTRODUODENOSCOPY (EGD);  Surgeon: Manya Silvas, MD;  Location: [facility] ENDOSCOPY;  Service: Endoscopy;  Laterality: N/A;   INCISION AND DRAINAGE ABSCESS Right 05/20/2019   Procedure: INCISION AND DRAINAGE ABSCESS, Right flank.;  Surgeon: Ronny Bacon, MD;  Location: [facility] ORS;  Service: General;  Laterality: Right;   NEPHRECTOMY     POLYPECTOMY  10/10/2016   Procedure: POLYPECTOMY;  Surgeon: Lucilla Lame, MD;  Location: [place];  Service: Endoscopy;;   SUPRAPUBIC CATHETER INSERTION     TEE WITHOUT CARDIOVERSION N/A 05/18/2019   Procedure: TRANSESOPHAGEAL ECHOCARDIOGRAM (TEE);  Surgeon: Nelva Bush, MD;  Location: [facility] ORS;  Service: Cardiovascular;  Laterality: N/A;     reports that he has been smoking cigarettes. He has a 55.00 pack-year smoking history. He has quit using smokeless tobacco.  His smokeless tobacco use included chew. He reports that he does not drink alcohol or use drugs.  Allergies  Allergen Reactions   Mellaril [Thioridazine] Shortness Of Breath and Swelling   Buprenorphine Itching   Morphine And Related Itching   Navane [Thiothixene] Other (See Comments)    Reaction:  GI upset    Thorazine [Chlorpromazine] Other (See Comments)    Reaction:  Dizziness/fainting     Family History    Problem Relation Age of Onset   Stroke Father    Pneumonia Father    Brain cancer Mother      Prior to Admission medications   Medication Sig Start Date End Date Taking? Authorizing Provider  ceftaroline 400 mg in sodium chloride 0.9 % 250 mL Inject 400 mg into the vein every 12 (twelve) hours. 05/18/2019   Lavina Hamman, MD  feeding supplement, ENSURE ENLIVE, (ENSURE ENLIVE) LIQD Take 237 mLs by mouth 3 (three) times daily between meals. 05/23/2019   Lavina Hamman, MD  ipratropium-albuterol (DUONEB) 0.5-2.5 (3) MG/3ML SOLN Take 3 mLs by nebulization 3 (three) times daily. 05/04/2019   Lavina Hamman, MD  levothyroxine (SYNTHROID, LEVOTHROID) 50 MCG tablet Take 50 mcg by mouth daily before breakfast.    [provider]  linezolid (ZYVOX) 600 MG/300ML IVPB Inject 300 mLs (600 mg total) into the vein every 12 (twelve) hours. 05/15/2019   Lavina Hamman, MD  LORazepam (ATIVAN) 2 MG/ML injection Inject 0.25 mLs (0.5 mg total) into the vein every 6 (six) hours as needed for anxiety. 05/28/2019   Lavina Hamman, MD  metoprolol tartrate (LOPRESSOR) 25 MG tablet Take 1 tablet (25 mg total) by mouth 2 (two) times daily. 05/08/2019   Lavina Hamman, MD  Multiple Vitamin (MULTIVITAMIN) LIQD Take 15 mLs by mouth daily. 05/11/2019   Lavina Hamman, MD  nicotine (NICODERM CQ - DOSED IN MG/24 HOURS) 21 mg/24hr patch Place  1 patch (21 mg total) onto the skin daily. 05/23/2019   Lavina Hamman, MD  oxyCODONE-acetaminophen (PERCOCET/ROXICET) 5-325 MG tablet Take 1 tablet by mouth every 4 (four) hours as needed for moderate pain or severe pain. 05/30/2019   Lavina Hamman, MD    Physical Exam: Vitals:   05/30/2019 2200  BP: (!) 144/79  Pulse: 99  Temp: 98.2 F (36.8 C)  TempSrc: Oral  SpO2: 96%  Weight: 90.1 kg  Height: 6' (1.829 m)    Constitutional: NAD, calm, comfortable Eyes: PERRL, lids and conjunctivae normal ENMT: Mucous membranes are moist. Posterior pharynx clear of any exudate or  lesions.Normal dentition.  Neck: normal, supple, no masses, no thyromegaly Respiratory: clear to auscultation bilaterally, no wheezing, no crackles. Normal respiratory effort. No accessory muscle use.  Cardiovascular: Regular rate and rhythm, no murmurs / rubs / gallops. No extremity edema. 2+ pedal pulses. No carotid bruits.  Abdomen: no tenderness, no masses palpated. No hepatosplenomegaly. Bowel sounds positive.  Musculoskeletal: no clubbing / cyanosis. No joint deformity upper and lower extremities. Good ROM, no contractures. Normal muscle tone.  Skin: no rashes, lesions, ulcers. No induration Neurologic: CN 2-12 grossly intact. Sensation intact, DTR normal. Strength 5/5 in all 4.  Psychiatric: Normal judgment and insight. Alert and oriented x 3. Normal mood.    Labs on Admission: I have personally reviewed following labs and imaging studies  CBC: Recent Labs  Lab 05/19/19 0651 05/20/19 0741 05/21/19 0508 05/22/19 0545 05/23/19 0418 05/29/2019 0735  WBC 19.0* 15.9* 16.5* 14.8* 13.2* 10.5  NEUTROABS 14.3*  --   --   --   --   --   HGB 9.7* 9.0* 8.9* 8.9* 8.0* 8.0*  HCT 30.3* 28.2* 27.3* 28.0* 26.5* 25.5*  MCV 89.6 89.5 88.6 88.9 93.3 89.8  PLT 233 282 327 365 378 654*   Basic Metabolic Panel: Recent Labs  Lab 05/18/19 0532 05/19/19 0651 05/20/19 0741 05/21/19 0508 05/22/19 0545 05/22/19 1639 05/23/19 0418 05/16/2019 0735  NA 146* 142 139   139 139 140 141 139 138  K 3.4* 4.3 3.8   3.8 4.4 5.3* 5.3* 5.1 5.2*  CL 111 111 111   111 110 111 111 111 111  CO2 24 22 19*   19* 20* 22 24 18* 20*  GLUCOSE 126* 110* 93   93 90 124* 98 94 89  BUN 47* 50* 60*   63* 64* 68* 60* 57* 54*  CREATININE 2.64* 2.95* 3.07*   3.08* 3.24* 2.98* 2.92* 2.85* 2.51*  CALCIUM 7.7* 7.9* 7.7*   7.4* 7.6* 7.8* 8.0* 7.9* 8.1*  MG 2.0 2.3 2.1  --  2.4  --   --   --   PHOS 4.0  --  5.1*   5.0* 5.7* 4.4  --  5.1* 4.3   GFR: Estimated Creatinine Clearance: 32.6 mL/min (A) (by C-G formula based on SCr  of 2.51 mg/dL (H)). Liver Function Tests: Recent Labs  Lab 05/19/19 0651 05/21/19 0508 05/22/19 0545 05/22/19 1639 05/23/19 0418 05/27/2019 0735  AST 52*  --   --  37  --   --   ALT 27  --   --  26  --   --   ALKPHOS 86  --   --  181*  --   --   BILITOT 4.0*  --   --  1.5*  --   --   PROT 5.6*  --   --  6.4*  --   --   ALBUMIN  1.6* 1.4* 1.5* 1.6* 1.5* 1.5*   No results for input(s): LIPASE, AMYLASE in the last 168 hours. Recent Labs  Lab 05/22/19 1639  AMMONIA 12   Coagulation Profile: No results for input(s): INR, PROTIME in the last 168 hours. Cardiac Enzymes: Recent Labs  Lab 05/18/19 0532  CKTOTAL 41*   BNP (last 3 results) No results for input(s): PROBNP in the last 8760 hours. HbA1C: No results for input(s): HGBA1C in the last 72 hours. CBG: Recent Labs  Lab 05/20/19 0804 05/20/19 1307 05/20/19 1644 05/20/19 2128 05/22/19 1433  GLUCAP 79 81 98 119* 94   Lipid Profile: No results for input(s): CHOL, HDL, LDLCALC, TRIG, CHOLHDL, LDLDIRECT in the last 72 hours. Thyroid Function Tests: Recent Labs    05/22/19 1639  TSH 9.012*  FREET4 0.73   Anemia Panel: Recent Labs    05/22/19 1639  VITAMINB12 618   Urine analysis:    Component Value Date/Time   COLORURINE AMBER (A) 05/19/2019 2202   APPEARANCEUR HAZY (A) 05/19/2019 2202   APPEARANCEUR Cloudy (A) 12/01/2018 0927   LABSPEC 1.013 05/19/2019 2202   LABSPEC 1.010 02/16/2014 1650   PHURINE 5.0 05/19/2019 2202   GLUCOSEU NEGATIVE 05/19/2019 2202   GLUCOSEU Negative 02/16/2014 1650   HGBUR SMALL (A) 05/19/2019 2202   BILIRUBINUR NEGATIVE 05/19/2019 2202   BILIRUBINUR Negative 12/01/2018 0927   BILIRUBINUR Negative 02/16/2014 1650   KETONESUR NEGATIVE 05/19/2019 2202   PROTEINUR NEGATIVE 05/19/2019 2202   UROBILINOGEN 0.2 11/07/2012 1049   NITRITE NEGATIVE 05/19/2019 2202   LEUKOCYTESUR TRACE (A) 05/19/2019 2202   LEUKOCYTESUR 3+ 02/16/2014 1650    Radiological Exams on Admission: US  RENAL  Result Date: 05/23/2019 CLINICAL DATA:  Acute kidney injury EXAM: RENAL / URINARY TRACT ULTRASOUND COMPLETE COMPARISON:  CT abdomen and pelvis 05/18/2019 FINDINGS: Right Kidney: Renal measurements: Prior nephrectomy Left Kidney: Renal measurements: 9.6 x 5.9 x 6.0 cm = volume: 181 mL. Mild pelviectasis/hydronephrosis. 12 mm shadowing calcification in the midpole. Normal echotexture Bladder: Bladder wall appears mildly thickened measuring 6 mm anteriorly. Other: None. IMPRESSION: Mild left hydronephrosis/pelviectasis, likely increased since prior CT. Mild bladder wall thickening. Electronically Signed   By: Rolm Baptise M.D.   On: 05/23/2019 11:49   DG Chest Port 1 View  Result Date: 05/10/2019 CLINICAL DATA:  Pleural effusion on the left EXAM: PORTABLE CHEST 1 VIEW COMPARISON:  Yesterday FINDINGS: Unchanged positioning of left pleural catheter with unchanged sizable left pleural effusion. The underlying lung is opacified/atelectatic by CT. Nodular opacity in the left upper lobe is redemonstrated. The right chest is essentially clear. Normal heart size. IMPRESSION: Unchanged sizable and loculated appearing left pleural effusion despite stably positioned pleural catheter. Electronically Signed   By: Monte Fantasia M.D.   On: 05/06/2019 05:19   DG Chest Port 1 View  Result Date: 05/23/2019 CLINICAL DATA:  Empyema. EXAM: PORTABLE CHEST 1 VIEW COMPARISON:  Radiograph yesterday. CT yesterday. FINDINGS: Left-sided chest tube with tip directed laterally towards the upper lung zone. Decreased left pleural effusion from prior exam. Pulmonary opacities on the left with upper lobe nodules. Pulmonary nodules on the right on CT are not well visualized radiographically. No pneumothorax. IMPRESSION: 1. Left chest tube in place with decreased left pleural effusion. 2. Persistent opacities throughout the left hemithorax, some of which are nodular in correspond to nodules on CT. Electronically Signed   By: Keith Rake M.D.   On: 05/23/2019 05:06    EKG: Independently reviewed.  Assessment/Plan Principal Problem:   Empyema (  [place]) Active Problems:   Schizophrenia ([place])   Hypothyroidism   Severe sepsis (HCC)   Pyohydronephrosis   Kidney stone   Chronic kidney disease, stage IV (severe) (HCC)   COPD (chronic obstructive pulmonary disease) (HCC)   Atrial flutter (HCC)   MRSA bacteremia   Septic embolism (HCC)   Epidural abscess   ETOH abuse    1. Disseminated MRSA - Bacteremia, empyema, epidural abscess, septic emboli in lungs 1. TEE neg for endocarditis but being treated as endocarditis given septic emboli 2. Currently on teflaro and LZD per ID recs, will continue these, pharm may adjust dose for renal function. 3. Empyema: 1. Scheduled for VATS tomorrow at 1445 it looks like 2. CT in place, management per CVTS 4. Epidural abscess: 1. Conservative management per [facility] Dr. [name], on ABx as above. 2. Consult NS here at [facility] in AM to see if they can evaluate in person. 3. Right now hes having back and leg pain, but no weakness nor paralysis at this point.  No urinary retention he reports. 5. Sepsis state seems resolved at this point 2. Pyohydronephrosis due to obstructing calculus, solitary L kidney, urine grew VRE - 1. S/p L ureteral stent, ureteral stricture dilation on 05/11/2019 2. Urology signed off at [facility], will need follow up for stent change outs etc as outpt. 3. On LZD as above 3. A.Fib/flutter - 1. Continue lopressor for rate control 2. Tele monitor 3. Anticoagulation not currently recommended due to h/o fall 4. Nl LV function on echo 4. EtOH abuse - 1. Out of withdrawal window 5. AKI on CKD stage 4 - 1. Creat seems to have stabilized / improved post stenting, continue to monitor with daily labs. 6. Hypothyroidism - continue synthroid 7. Paranoid schizophrenia - 1. Home meds (looks like he takes prozac and invega) currently on hold, resume as soon as feasible. 2. On hold  due to LZD being a MAOI perhaps?  DVT prophylaxis: SCDs Code Status: Full Family Communication: No family in room Disposition Plan: TBD Consults called: CVTS has him on schedule for VATS tomorrow at 1445, Call NS in AM, presumably needs ID consult here as well Admission status: Admit to inpatient  Severity of Illness: The appropriate patient status for this patient is INPATIENT. Inpatient status is judged to be reasonable and necessary in order to provide the required intensity of service to ensure the patient's safety. The patient's presenting symptoms, physical exam findings, and initial radiographic and laboratory data in the context of their chronic comorbidities is felt to place them at high risk for further clinical deterioration. Furthermore, it is not anticipated that the patient will be medically stable for discharge from the hospital within 2 midnights of admission. The following factors support the patient status of inpatient.   IP status due to disseminated MRSA with multiple organ involvement.  Needs surgical decortication for empyema tomorrow.   * I certify that at the point of admission it is my clinical judgment that the patient will require inpatient hospital care spanning beyond 2 midnights from the point of admission due to high intensity of service, high risk for further deterioration and high frequency of surveillance required.*    Romi Rathel M. DO [facility]  How to contact the [facility] Attending or Consulting provider [place] or covering provider during after hours [place], for this patient?  1. Check the care team in [facility] and look for a) attending/consulting [facility] provider listed and b) the [facility] team listed 2. Log  into [email or web address]  Amion Physician Scheduling and messaging for groups and whole hospitals  On call and physician scheduling software for group practices, residents, hospitalists and other medical providers for call, clinic, rotation and shift schedules.  OnCall Enterprise is a hospital-wide system for scheduling doctors and paging doctors on call. EasyPlot is for scientific plotting and data analysis.  [email or web address]  and use [place]'s universal password to access. If you do not have the password, please contact the hospital operator.  3. Locate the [facility] provider you are looking for under [facility] and page to a number that you can be directly reached. 4. If you still have difficulty reaching the provider, please page the [facility] (Director on Call) for the Hospitalists listed on amion for assistance.  05/19/2019, 11:00 PM

## 2019-05-24 NOTE — Consult Note (Signed)
PHARMACY CONSULT NOTE - FOLLOW UP  Pharmacy Consult for Electrolyte Monitoring and Replacement   Recent Labs: Potassium (mmol/L)  Date Value  05/07/2019 5.2 (H)  09/19/2014 3.7   Magnesium (mg/dL)  Date Value  05/22/2019 2.4  12/08/2012 1.8   Calcium (mg/dL)  Date Value  05/14/2019 8.1 (L)   Calcium, Total (mg/dL)  Date Value  09/19/2014 8.9   Albumin (g/dL)  Date Value  05/31/2019 1.5 (L)  09/19/2014 3.5   Phosphorus (mg/dL)  Date Value  05/12/2019 4.3   Sodium (mmol/L)  Date Value  05/26/2019 138  09/19/2014 38   63 year old male admitted with MRSA bacteremia with a past medical history of asthma, COPD, depression, gastric ulcer, GERD, hypothyroid, MI, stroke, schizophrenia, and seizure.  He has a complicated urological history as well, as he only possesses one kidney with emergent stent placement this admission.    Goal of Therapy:  All electrolytes wnl  Plan:   Potassium is borderline high: no potassium binders required at this level  No electrolyte supplementation required today, renal function improving  continue to follow electrolyte trend   Renal function panel, magnesium in am  Pharmacy will continue to monitor and adjust per consult.   Dallie Piles, PharmD  05/04/2019 11:14 AM

## 2019-05-24 NOTE — Progress Notes (Addendum)
[facility] Hospital Day(s): 13.   Post op day(s): 4 Days Post-Op.   Interval History:  Patient seen and examined no acute events or new complaints overnight.  Patient reports he is sore on his left chest near his chest tube site No fevers, chills He does not feel his breathing has changed much.  Leukocytosis has normalized (10.2k)  Dr Genevive Bi has also been consulted for management of his left chest tube and left loculated pleural effusion vs empyema. He is unfortunately gone this week and we will help cover this patient for him. Chest tube output 270 ccs in last 24 hours. CXR unchanged, still with loculated left pleural effusion.    Vital signs in last 24 hours: [min-max] current  Temp:  [97.7 F (36.5 C)-98.7 F (37.1 C)] 98.7 F (37.1 C) (12/22 0414) Pulse Rate:  [67-79] 79 (12/22 0414) Resp:  [12-23] 20 (12/21 2015) BP: (97-141)/(57-77) 119/73 (12/22 0414) SpO2:  [95 %-100 %] 95 % (12/22 0414)     Height: 6' (182.9 cm) Weight: 90.7 kg BMI (Calculated): 27.11   Intake/Output last 2 shifts:  12/21 0701 - 12/22 0700 In: 9323 [P.O.:720; IV Piggyback:500] Out: 5573 [Urine:2100; Drains:87; Chest Tube:270]    Physical Exam:  Constitutional: alert, cooperative and no distress  Respiratory: On [place], slight expiratory wheeze bilaterally, he does have decreased and rhonchorous breath sounds on the left Chest; Chest tube to the left lateral chest wall, output more serous today Cardiovascular: regular rate and sinus rhythm  Integumentary:  Incision right lateral chest wall/flank is intact with staples, erythema improved, there is drainage on honeycomb   Labs:  CBC Latest Ref Rng & Units 05/23/2019 05/22/2019 05/21/2019  WBC 4.0 - 10.5 K/uL 13.2(H) 14.8(H) 16.5(H)  Hemoglobin 13.0 - 17.0 g/dL 8.0(L) 8.9(L) 8.9(L)  Hematocrit 39.0 - 52.0 % 26.5(L) 28.0(L) 27.3(L)  Platelets 150 - 400 K/uL 378 365 327   CMP Latest Ref Rng & Units 05/23/2019  05/22/2019 05/22/2019  Glucose 70 - 99 mg/dL 94 98 124(H)  BUN 8 - 23 mg/dL 57(H) 60(H) 68(H)  Creatinine 0.61 - 1.24 mg/dL 2.85(H) 2.92(H) 2.98(H)  Sodium 135 - 145 mmol/L 139 141 140  Potassium 3.5 - 5.1 mmol/L 5.1 5.3(H) 5.3(H)  Chloride 98 - 111 mmol/L 111 111 111  CO2 22 - 32 mmol/L 18(L) 24 22  Calcium 8.9 - 10.3 mg/dL 7.9(L) 8.0(L) 7.8(L)  Total Protein 6.5 - 8.1 g/dL - 6.4(L) -  Total Bilirubin 0.3 - 1.2 mg/dL - 1.5(H) -  Alkaline Phos 38 - 126 U/L - 181(H) -  AST 15 - 41 U/L - 37 -  ALT 0 - 44 U/L - 26 -     Imaging studies:   CXR (05/23/2019) personally reviewed without improvement in left pleural effusion, and radiologist report reviewed as well:  IMPRESSION: Unchanged sizable and loculated appearing left pleural effusion despite stably positioned pleural catheter.   Assessment/Plan:  63 y.o. male with improved leukocytosis 4 Days Post-Op s/p incision and drainage for purulent RLQ myositis with abscess, and now with left chest tube secondary to left pleural effusion vs empyema, complicated by multiple comorbid conditions   - From a CT Surgery perspective, I discussed this patient with Dr Genevive Bi via telephone this morning. He is out of the hospital this week and we will help manage the chest tube. Unfortunately given the posterior epidural abscess seen on on MRI (L3-L5) there is a contraindication to giving intrapleural tPA in this patient. We can consider  flushing the chest tube with normal saline in attempt to break up loculation however uncertain how effective this will be. Continue chest tube to suction. We will continue to evaluate this patient however I am uncertain if he will be a good candidate for nor tolerate decortication which at the earliest would be next week here given lack of CT surgery resources at [facility] this week. It may be in his best interest to be transferred to [facility] to be evaluated by [facility] ([facility]) for management of this  condition.      - From a general surgery perspective:    - Continue IV Abx (Ceftaroline + Linezolid); Cx with rare staph aureus; susceptibilities pending; ID folowing    - Okay to remove honeycomb; dry superficial dressing prn              - Monitor leukocytosis; fever curve              - pain control prn              - No further surgical re-intervention; we will follow   All of the above findings and recommendations were discussed with the patient, and the medical team, and all of patient's questions were answered to his expressed satisfaction.  -- Edison Simon, PA-C [facility] 05/11/2019, 8:09 AM [phone] M-F: 7am - 4pm

## 2019-05-24 NOTE — Consult Note (Signed)
Pharmacy Antibiotic Note  Alan Small is a 63 y.o. male admitted on 05/11/2019 with MRSA bacteremia with suspected source from central line that was present on admission (placed 9/23 at [facility] which has since been removed. Patient with respiratory failure on admission and was intubated 12/9-12/11 and is now on 3L [place]. History of solitary left kidney and 12/9 CT significant for partially obstructing 5 mm left ureteral calculus for which he underwent urgent ureteral stent placement. TEE is negative for any vegetations. CT chest with cavitary lesions in left lung c/f septic emboli and abnormal expansion of the muscles of the lateral aspect of the right abdominal wall c/f intramuscular abscess/infectious myositis.    Patient was admitted at [facility] 9/10-9/30 for pyelonephritis, nephrolithiasis and retroperitoneal hematoma/abscess. Urine culture from that admission with MDR E coli. There was concern for infected stone and appears he was on imipenem/cilastatin 9/21 thru 11/14. He is s/p left PCNL followed by left URS/LL/stent of residual fragments and stent removal at [facility]. Renal calculi culture from 11/3 with <1+ Enterococcus faecium (VRE). His renal function has steadily improved since the previous note  Plan:  1) continue linezolid 600mg  IV Q12 hours.  2) change ceftaroline dose to 400 mg IV every 12 hours  Height: 6' (182.9 cm) Weight: 199 lb 15.3 oz (90.7 kg) IBW/kg (Calculated) : 77.6  Temp (24hrs), Avg:98.1 F (36.7 C), Min:97.7 F (36.5 C), Max:98.7 F (37.1 C)  Recent Labs  Lab 05/20/19 0741 05/21/19 0508 05/22/19 0545 05/22/19 1639 05/23/19 0418 05/04/2019 0735  WBC 15.9* 16.5* 14.8*  --  13.2* 10.5  CREATININE 3.07*  3.08* 3.24* 2.98* 2.92* 2.85* 2.51*    Estimated Creatinine Clearance: 32.6 mL/min (A) (by C-G formula based on SCr of 2.51 mg/dL (H)).    Antimicrobials this admission: meropenem 12/9 >> 12/10 vancomycin 12/10 x 1 Daptomycin 12/15 >>12/17 Ceftaroline 12/10  >> linezolid 12/17 >>  Microbiology results: 12/17 Pleural fluid: MRSA 12/14 Ucx: >100k colonies/mL E faecium 12/13 Bcx: NG 12/12 Bcx: 1/4 Staph aureus 12/9 BCx: 4/4 MRSA 12/9 UCx: no growth 12/9 SARS CoV-2: negative  12/9 MRSA PCR: positive  Thank you for allowing pharmacy to be a part of this patient's care.  Dallie Piles  05/28/2019 11:15 AM

## 2019-05-24 NOTE — Progress Notes (Signed)
Patient arrived to 4E-26. CHG bath given, vital signs taken, patient connected to O2 via nasal cannula at 1L. Cardiac monitoring connected to patient, CCMD notified. Call bell and bedside table within reach. Patient resting in bed.

## 2019-05-24 NOTE — Progress Notes (Signed)
Patient transferring to [facility] 26.  Report given to [facility]. No needs or concerns verbalized.  Awaiting transportation.

## 2019-05-24 NOTE — Progress Notes (Signed)
Patient discharged , VSS, via [facility] to [facility] room 26 unit 5E. Report given to [facility]. All of patient's belongings with him.

## 2019-05-24 NOTE — Discharge Summary (Signed)
[facility] Discharge Summary   Patient: Alan Small MVE:[id number]   PCP: [name], MD DOB: 30-Sep-1955   Date of admission: 05/11/2019   Date of discharge:  05/23/2019    Discharge Diagnoses:  Principal Problem:   Severe sepsis ([place]) Active Problems:   Hyperkalemia   Schizophrenia ([place])   Hypothyroidism   Protein-calorie malnutrition, severe ([place])   Encephalopathy acute   Metabolic acidosis   Atony of bladder   Chronic kidney disease, stage IV (severe) (HCC)   COPD (chronic obstructive pulmonary disease) ([place])   Depression   Constipation   Atrial flutter ([place])   MRSA bacteremia   Pyomyositis   Empyema ([place])   Septic embolism ([place])  Admitted From: Home Disposition:  [facility] progressive care unit  Recommendations for Outpatient Follow-up:  1. PCP: Depending on outcome of the hospitalization 2. Follow up LABS/TEST: Patient will require new consultation with cardiothoracic surgery, neurosurgery.  Please inform them about patient's arrival on the floor. 3. Patient has active consultation from infectious disease, palliative care.  Please reconsult them. 4. Patient may require consultation from urology, pulmonary, nephrology.  Diet recommendation: Dysphagia type 1 thin liquid, will require speech therapy consultation  Activity: The patient is advised to gradually reintroduce usual activities,as tolerated  Discharge Condition: good  Code Status: Full code   History of present illness: As per the H and P dictated on admission, "This is a 63 year old current smoker with a solitary kidney (LEFT) and a history as noted below, who apparently is followed at [facility] urology.  History is obtained from available records as the patient cannot provide history due to being intubated and on a ventilator.  He is post placement of a ureteral stent for a partially obstructing ureteral calculus.  The patient was having symptoms of fatigue, shortness of  breath and flank pain on presentation according to the records.  The patient was noted to be tachypneic and tachycardic and obviously appeared septic.  He was getting meropenem through a right IJ Port-A-Cath as an outpatient.  He has a history of ESBL in the past.   He was initially placed on BiPAP in the emergency room but failed this and required intubation.  Postoperatively he remains on the ventilator and we are asked to admit the patient."  Hospital Course:  Patient is a 63 year old male with solitary left kidney, CKD stage IV, left staghorn calculus SP stenting, urethral stricture SP urethroplasty, schizophrenia, clinically diagnosed COPD, depression. Patient was admitted at [facility] between February 10, 2019 and March 02, 2019.  Patient was treated for pyelonephritis and retroperitoneal fluid collection concerning for abscess.  Urine culture grew MDR E. coli.  Patient underwent aspiration of the abscess on 9/17 with JP drain placement.  Patient was started on IV imipenem with a PICC line.  Summary of his active problems in the hospital is as following.  Underwent left percutaneous nephrolithotomy 03/28/2019 and ureteral stent placement at the same time.  Underwent laser lithotripsy and basket extraction on 04/05/2019.  Patient was recommended to continue the antibiotics until April 16, 2019. Patient presented to [facility] on 05/11/2019 with shortness of breath left leg pain and fever and nausea and vomiting.  CT scan showed evidence of stone on the left side.  Patient was initially placed on the BiPAP.  His oxygenation worsened and patient actually required intubation in the emergency department.  Urology was consulted and patient underwent urgent cystoscopy and left ureteral stent placement.  Patient was started on broad-spectrum  antibiotics and his blood cultures came back positive for MRSA.  Patient was extubated on 05/20/2019 and since then has remained on nasal cannula.  Patient's PICC line was  removed on 05/12/2019 and a central line was placed which was removed on 05/18/2019.  Foley catheter was inserted on 05/11/2019 after the urology procedure and removed on 05/18/2019. TEE showed no evidence of endocarditis but the patient does have evidence of septic emboli with cavitary lesions on bilateral lung parenchyma therefore patient is treated as infective endocarditis. 05/19/2019 left thoracentesis/pleural fluid culture came back positive for MRSA.  Report showed loculated effusion post procedure. 05/20/2019 had pyomyositis underwent right-sided drain placement after incision and debridement. 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  [facility] neurosurgery was consulted who recommended conservative management.  Dr. [name] was on call. Due to worsening shortness of breath CT chest performed on 05/22/2019 showed evidence of recollection of pleural fluid.  Chest tube was inserted by PCCM on 05/22/2019. Patient currently in the process of being transferred to [facility] for further evaluation by cardiothoracic surgery as patient is not a candidate for TPA in pleural space due to epidural abscess and may require decortication.  Severe sepsiswith septic shock - MRSA bacteremia likely from previous central line which is removed Acute respiratory failure due to COPD exacerbation in the setting of Severe sepsis -present on admission Pyomyositis SP right-sided flank drain placement empyema SP thoracentesis. 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  Initially admitted to the ICU after intubation.  Patient was extubated and was transferred to the floor. Currently on 3 L of oxygen. Blood cultures came back positive for MRSA. Urine culture came back positive for VRE. 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 Infectious disease following the patient. Patient had left IJ placed on 05/12/2019 when he was bacteremic. This line is removed now.   Urology was initially consulted as well which currently has signed off. General surgery is currently following up on the patient.  For empyema highly appreciate PCCM Dr. Patsey Berthold assistance for placement of chest tube placement.    General surgery was following the patient for chest tube management.  Per documentation from general surgery cardiothoracic surgery is not in-house during this week and patient not a candidate for TPA given his history of epidural abscess and therefore patient may require decortication procedure.  For this the patient will be transferred to [facility] for cardiothoracic surgery evaluation.  Discussed with neurosurgery at [facility] with Dr. [name] who recommends conservative management with IV antibiotics as long as the patient does not have any symptoms.  I have discussed with neurosurgery at [facility] to see if they can evaluate the patient in person.  Appreciate their assistance.  Continue Teflaro and Zyvox per ID So far repeat blood cultures have been negative.  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  The stricture was dilated.  The stent was placed in the left ureter.  The urine culture on 05/11/2019 was negative. A repeat urine culture was sent on 05/16/2019 and that is positive for VRE. ID thinks this is not cardiac given that the patient  has staghorn calculus which was obstructed and suspect that the VRE represents colonized but infected stone.  VRE urinary tract infection -on Zyvox Known history of ESBL  A. fib/flutter with rapid ventricular rate -Currently in sinus  tach with PVCs/PACs -was on amiodaron drip, for now on lopressor -Anticoagulation not recommended due to history of fall -Echo with normal LV function  Alcohol withdrawal syndrome Out of the window for withdrawal. Continue to monitor.  Acute on chronic renal failure Solitary kidney Avoid nephrotoxins Maintain perfusion,avoid hypotension Repeat ultrasound renal shows evidence of mild worsening of hydronephrosis but renal function currently stable.  Monitor. May require nephrology consultation.  Paranoid schizophrenia Resume home medications as soon as feasible   Hyponatremia Hypokalemia Hypomagnesemia Resolved monitor.  Goals of care discussion. Discussed with patient wants to continue current aggressive care and wants to get better. Discussed with patient's legal guardian and explained patient's poor prognosis we will continue to monitor recommendation from consultants and will make informed decisions. Informed with patient's sister at the request of patient as well.  On the day of the discharge the patient's vitals were stable, and no other acute medical condition were reported by patient. the patient was felt safe to be discharge at [place]: General surgery, urology, PCCM, palliative care, infectious disease Procedures: Urinary stent placement, PICC line removal, central line placement and removal, Foley catheter placement and removal, right abdominal drain placement, left chest tube placement, intubation and extubation.  DISCHARGE MEDICATION: Allergies as of 05/14/2019      Reactions   Mellaril [thioridazine] Shortness Of Breath, Swelling   Buprenorphine Itching   Morphine And Related Itching   Navane [thiothixene] Other (See Comments)   Reaction:  GI upset    Thorazine [chlorpromazine] Other (See Comments)   Reaction:  Dizziness/fainting       Medication List    STOP taking these medications   acetaminophen 500 MG tablet Commonly  known as: TYLENOL   Benadryl Allergy 25 MG tablet Generic drug: diphenhydrAMINE   Combivent 18-103 MCG/ACT inhaler Generic drug: albuterol-ipratropium Replaced by: ipratropium-albuterol 0.5-2.5 (3) MG/3ML Soln   docusate sodium 100 MG capsule Commonly known as: COLACE   FLUoxetine 20 MG capsule Commonly known as: PROZAC   Invega Sustenna 156 MG/ML Susy injection Generic drug: paliperidone   lidocaine 5 % Commonly known as: LIDODERM   nystatin cream Commonly known as: MYCOSTATIN   omeprazole 20 MG capsule Commonly known as: PRILOSEC   oxybutynin 5 MG tablet Commonly known as: DITROPAN   sodium bicarbonate 650 MG tablet   Symbicort 160-4.5 MCG/ACT inhaler Generic drug: budesonide-formoterol   tamsulosin 0.4 MG Caps capsule Commonly known as: FLOMAX     TAKE these medications   ceftaroline 400 mg in sodium chloride 0.9 % 250 mL Inject 400 mg into the vein every 12 (twelve) hours.   feeding supplement (ENSURE ENLIVE) Liqd Take 237 mLs by mouth 3 (three) times daily between meals.   ipratropium-albuterol 0.5-2.5 (3) MG/3ML Soln Commonly known as: DUONEB Take 3 mLs by nebulization 3 (three) times daily. Replaces: Combivent 18-103 MCG/ACT inhaler   levothyroxine 50 MCG tablet Commonly known as: SYNTHROID Take 50 mcg by mouth daily before breakfast.   linezolid 600 MG/300ML IVPB Commonly known as: ZYVOX Inject 300 mLs (600 mg total) into the vein every 12 (twelve) hours.   LORazepam 2 MG/ML injection Commonly known as: ATIVAN Inject 0.25 mLs (0.5 mg total) into the vein every 6 (six) hours as needed for anxiety.   metoprolol tartrate  25 MG tablet Commonly known as: LOPRESSOR Take 1 tablet (25 mg total) by mouth 2 (two) times daily.   multivitamin Liqd Take 15 mLs by mouth daily. Start taking on: May 25, 2019   nicotine 21 mg/24hr patch Commonly known as: NICODERM CQ - dosed in mg/24 hours Place 1 patch (21 mg total) onto the skin daily. Start  taking on: May 25, 2019   oxyCODONE-acetaminophen 5-325 MG tablet Commonly known as: PERCOCET/ROXICET Take 1 tablet by mouth every 4 (four) hours as needed for moderate pain or severe pain.      Allergies  Allergen Reactions  . Mellaril [Thioridazine] Shortness Of Breath and Swelling  . Buprenorphine Itching  . Morphine And Related Itching  . Navane [Thiothixene] Other (See Comments)    Reaction:  GI upset   . Thorazine [Chlorpromazine] Other (See Comments)    Reaction:  Dizziness/fainting    Discharge Instructions    DIET - DYS 1   Complete by: As directed    Fluid consistency: Thin   Increase activity slowly   Complete by: As directed      Discharge Exam: Filed Weights   05/22/19 0301 05/22/19 1429 05/23/19 0500  Weight: 87.7 kg 87.7 kg 90.7 kg   Vitals:   05/28/2019 1015 05/14/2019 1335  BP: 128/70   Pulse: 86   Resp:    Temp:    SpO2: 94% 94%   General: Appear in mild distress, no Rash; Oral Mucosa Clear, moist. no Abnormal Mass Or lumps Cardiovascular: S1 and S2 Present, no Murmur, Respiratory: increased respiratory effort, Bilateral Air entry present and bilateral  Crackles, no wheezes Abdomen: Bowel Sound present, Soft and no tenderness, no hernia Extremities: trace Pedal edema, no calf tenderness Neurology: alert and oriented to time, place, and person affect appropriate.  The results of significant diagnostics from this hospitalization (including imaging, microbiology, ancillary and laboratory) are listed below for reference.    Significant Diagnostic Studies: CT ABDOMEN PELVIS WO CONTRAST  Result Date: 05/18/2019 CLINICAL DATA:  Abdominal pain, fever. Solitary left kidney with obstructing ureteral calculus status post ureteral stent placement. EXAM: CT ABDOMEN AND PELVIS WITHOUT CONTRAST TECHNIQUE: Multidetector CT imaging of the abdomen and pelvis was performed following the standard protocol without IV contrast. COMPARISON:  05/15/2019 FINDINGS: Lower  chest: Heart is normal size. Moderate left pleural effusion, increasing since prior study. Bilateral lower lobe atelectasis or consolidation. Stranding is again noted within the epicardial fat pad along the left heart border, stable since recent study. Hepatobiliary: Multiple low-density lesions again seen within the liver, unchanged, likely cysts. Large exophytic cyst off the left hepatic lobe is stable. Gallbladder unremarkable. Pancreas: No focal abnormality or ductal dilatation. Spleen: No focal abnormality.  Normal size. Adrenals/Urinary Tract: Solitary left kidney. Left ureteral stent is in place, unchanged. No hydronephrosis. Calcification in the midpole of the left kidney is unchanged. Urinary bladder is unremarkable. Adrenal glands unremarkable. Stomach/Bowel: Stomach, large and small bowel grossly unremarkable. Vascular/Lymphatic: Aortic atherosclerosis. No enlarged abdominal or pelvic lymph nodes. Reproductive: Extensive prostate calcifications. Other: No free fluid or free air. Musculoskeletal: No acute bony abnormality. Again noted is enlargement of the right oblique abdominal wall musculature with possible low-density fluid collection within the muscle. This is similar to prior study. IMPRESSION: Moderate left pleural effusion, increased since prior study. Bibasilar atelectasis or consolidation/pneumonia. Enlargement of the right oblique abdominal wall musculature with low-density/fluid density within the muscle. This could reflect myositis, muscle injury, or abscess. This is similar to prior study. Left ureteral stent  in place. No hydronephrosis or change since prior study. Solitary left kidney. Aortic atherosclerosis. Electronically Signed   By: Rolm Baptise M.D.   On: 05/18/2019 21:23   CT ABDOMEN PELVIS WO CONTRAST  Result Date: 05/15/2019 CLINICAL DATA:  Right-sided edema and warmth and redness. Urinary tract infection. Recent placement of left ureteral stent. EXAM: CT CHEST, ABDOMEN AND  PELVIS WITHOUT CONTRAST TECHNIQUE: Multidetector CT imaging of the chest, abdomen and pelvis was performed following the standard protocol without IV contrast. COMPARISON:  CT scans dated 05/11/2019 and 08/20/2018 FINDINGS: CT CHEST FINDINGS Cardiovascular: Slight aortic atherosclerosis. Heart size is normal. No pericardial effusion. However, there is extensive acute soft tissue stranding in the left pericardial fat pad, likely infectious. Mediastinum/Nodes: New extensive soft tissue stranding pericardial fat pad worrisome for infection. No mediastinal adenopathy. Thyroid gland is atrophic. Tiny hiatal hernia. Lungs/Pleura: There are multiple cavitary lesions in the left lung, likely representing septic emboli. There is an irregular 2.2 cm nodule in the left upper lobe which could represent a mass but is probably a septic embolus which is not yet cavitated. There is a 5 mm lesion in the lateral aspect of the left upper lobe which has not yet cavitated. Hazy infiltrate at the right lung apex with a small nodule posterior medially at the apex. Consolidation at the inferior aspect of the right hilum. There are new small bilateral pleural effusions, left greater than right with atelectasis at both lung bases. Musculoskeletal: There is slight soft tissue stranding around the inferior aspect of the right latissimus dorsi muscle soft tissue stranding in the subcutaneous fat of both flanks, right greater than left. No acute bone abnormality. CT ABDOMEN PELVIS FINDINGS Hepatobiliary: 9 cm cyst on the tip of the left lobe of the liver in the left upper quadrant 2 cm cyst in the caudate lobe of the liver, unchanged. There are 216 mm cysts in the inferior aspect of the right lobe of the liver also unchanged. Biliary tree is normal. Pancreas: Unremarkable. No pancreatic ductal dilatation or surrounding inflammatory changes. Spleen: Normal in size without focal abnormality. Adrenals/Urinary Tract: Adrenal glands are normal. Left  ureteral stent in place. Stone in the mid left kidney, unchanged. No hydronephrosis. Previous right nephrectomy. Foley catheter in the bladder. Stomach/Bowel: Stomach is within normal limits. Appendix has been removed. No evidence of bowel wall thickening, distention, or inflammatory changes. Vascular/Lymphatic: Aortic atherosclerosis. No enlarged abdominal or pelvic lymph nodes. Reproductive: Numerous benign-appearing calcifications in the prostate gland. Other: No abdominal wall hernia or abnormality. No abdominopelvic ascites. Musculoskeletal: There is abnormal expansion of the muscles of the lateral aspect of the right abdominal wall with lucency in that area, worrisome for an intramuscular abscess. There is soft tissue stranding in the adjacent subcutaneous fat. Soft tissue stranding seen around the distal right latissimus Dorsey muscle and that muscle is slightly hypertrophied as compared to the left and may also be involved infection. There is subcutaneous edema at the lateral aspect of both hips, right more than left. No significant bone abnormality. IMPRESSION: 1. Multiple cavitary lesions in the left lung consistent with septic emboli. Abnormal soft tissue stranding in the prominent left pericardial fat pad also worrisome for infection. 2. Abnormal expansion of the muscles of the lateral aspect of the right abdominal wall with lucency in that area, worrisome for an intramuscular abscess/infectious myositis. 3. Soft tissue stranding around the inferior aspect of the right latissimus dorsi muscle and in the subcutaneous fat of both flanks, right greater than left, consistent  with cellulitis. 4. New small bilateral pleural effusions, left greater than right. 5. Aortic Atherosclerosis (ICD10-I70.0). 6. New left ureteral stent appears in good position. No acute abnormalities of the left kidney. Electronically Signed   By: Lorriane Shire M.D.   On: 05/15/2019 16:02   DG Skull 1-3 Views  Result Date:  05/21/2019 CLINICAL DATA:  Pre MRI clearance. EXAM: SKULL - 1-3 VIEW COMPARISON:  CT head 08/20/2018. FINDINGS: The skull is intact. There is no evidence of acute fracture or focal lesion. There are no radiopaque foreign bodies. Pineal region calcifications are noted. The visualized paranasal sinuses are clear. IMPRESSION: No evidence of intracranial radiopaque foreign body acute osseous findings. Electronically Signed   By: Richardean Sale M.D.   On: 05/21/2019 13:54   DG Chest 1 View  Result Date: 05/21/2019 CLINICAL DATA:  Increasing shortness of breath. Smoker. History of asthma/COPD. EXAM: CHEST  1 VIEW COMPARISON:  Radiographs 05/19/2019 and 05/15/2019. CT 05/15/2019. FINDINGS: 1324 hours. Left pleural effusion has partially reaccumulated and appears loculated laterally. The underlying cavitary lesions in the left lung are partially obscured, although grossly stable. There is increased compressive left lung atelectasis. The right lung is clear. There is no pneumothorax. The heart size and mediastinal contours are stable. IMPRESSION: 1. Interval partial reaccumulation of loculated left pleural effusion. 2. No pneumothorax. Electronically Signed   By: Richardean Sale M.D.   On: 05/21/2019 13:58   CT HEAD WO CONTRAST  Result Date: 05/22/2019 CLINICAL DATA:  Encephalopathy. Hallucinations. EXAM: CT HEAD WITHOUT CONTRAST TECHNIQUE: Contiguous axial images were obtained from the base of the skull through the vertex without intravenous contrast. COMPARISON:  08/20/2018 FINDINGS: Brain: There is no evidence of acute infarct, intracranial hemorrhage, mass, midline shift, or extra-axial fluid collection. Mild cerebral atrophy is unchanged and likely within normal limits for age. Low level hypoattenuation in the cerebral white matter is nonspecific but may reflect mild chronic small vessel ischemic disease. Vascular: Calcified atherosclerosis at the skull base. No hyperdense vessel. Skull: No fracture or focal  osseous lesion. Sinuses/Orbits: Visualized paranasal sinuses and mastoid air cells are clear. Orbits are unremarkable. Other: None. IMPRESSION: No evidence of acute intracranial abnormality. Electronically Signed   By: Logan Bores M.D.   On: 05/22/2019 15:26   CT CHEST WO CONTRAST  Result Date: 05/22/2019 CLINICAL DATA:  63 year old male with increased shortness of breath, known septic emboli EXAM: CT CHEST WITHOUT CONTRAST TECHNIQUE: Multidetector CT imaging of the chest was performed following the standard protocol without IV contrast. COMPARISON:  Prior CT scan of the chest 05/15/2019 FINDINGS: Cardiovascular: Two vessel aortic arch. The right brachiocephalic and left common carotid artery share a common origin. The aorta is normal in caliber. The main pulmonary artery is normal in caliber. The heart is normal in size. No pericardial effusion. Mediastinum/Nodes: Unremarkable CT appearance of the thyroid gland. No suspicious mediastinal or hilar adenopathy. No soft tissue mediastinal mass. The thoracic esophagus is unremarkable. Lungs/Pleura: Approximately 1.1 cm left upper lobe pulmonary nodule is similar in size compared to prior but with decreased central cavitation. Left-sided pulmonary nodule measures 2.5 cm in diameter. Numerous additional scattered pulmonary nodules as previously seen. Increased subsegmental atelectasis in the right lung base. No evidence of pulmonary edema. Enlarging left pleural effusion with associated left lower lobe atelectasis. Upper Abdomen: Circumscribed 10.4 cm low-attenuation cystic lesion exophytic from the left lobe of the liver. There is a smaller 2.4 cm cyst arising from the caudate lobe of the liver and an additional low-attenuation lesion  posteriorly in hepatic segment 6 which measures 2 cm. All lesions are essentially unchanged compared to the recent prior imaging. Musculoskeletal: No acute fracture or aggressive appearing lytic or blastic osseous lesion. IMPRESSION:  1. Persistent bilateral pulmonary nodules with slightly decreased cavitation compared to the recent prior imaging. Findings remain most consistent with bilateral septic pulmonary emboli. 2. Enlarging left-sided pleural effusion with left lower lobe atelectasis. 3. Slightly increased right lower lobe atelectasis as well. 4. No evidence of pulmonary edema. 5. Additional ancillary findings as above without significant interval change. Electronically Signed   By: Jacqulynn Cadet M.D.   On: 05/22/2019 15:33   CT CHEST WO CONTRAST  Result Date: 05/15/2019 CLINICAL DATA:  Right-sided edema and warmth and redness. Urinary tract infection. Recent placement of left ureteral stent. EXAM: CT CHEST, ABDOMEN AND PELVIS WITHOUT CONTRAST TECHNIQUE: Multidetector CT imaging of the chest, abdomen and pelvis was performed following the standard protocol without IV contrast. COMPARISON:  CT scans dated 05/11/2019 and 08/20/2018 FINDINGS: CT CHEST FINDINGS Cardiovascular: Slight aortic atherosclerosis. Heart size is normal. No pericardial effusion. However, there is extensive acute soft tissue stranding in the left pericardial fat pad, likely infectious. Mediastinum/Nodes: New extensive soft tissue stranding pericardial fat pad worrisome for infection. No mediastinal adenopathy. Thyroid gland is atrophic. Tiny hiatal hernia. Lungs/Pleura: There are multiple cavitary lesions in the left lung, likely representing septic emboli. There is an irregular 2.2 cm nodule in the left upper lobe which could represent a mass but is probably a septic embolus which is not yet cavitated. There is a 5 mm lesion in the lateral aspect of the left upper lobe which has not yet cavitated. Hazy infiltrate at the right lung apex with a small nodule posterior medially at the apex. Consolidation at the inferior aspect of the right hilum. There are new small bilateral pleural effusions, left greater than right with atelectasis at both lung bases.  Musculoskeletal: There is slight soft tissue stranding around the inferior aspect of the right latissimus dorsi muscle soft tissue stranding in the subcutaneous fat of both flanks, right greater than left. No acute bone abnormality. CT ABDOMEN PELVIS FINDINGS Hepatobiliary: 9 cm cyst on the tip of the left lobe of the liver in the left upper quadrant 2 cm cyst in the caudate lobe of the liver, unchanged. There are 216 mm cysts in the inferior aspect of the right lobe of the liver also unchanged. Biliary tree is normal. Pancreas: Unremarkable. No pancreatic ductal dilatation or surrounding inflammatory changes. Spleen: Normal in size without focal abnormality. Adrenals/Urinary Tract: Adrenal glands are normal. Left ureteral stent in place. Stone in the mid left kidney, unchanged. No hydronephrosis. Previous right nephrectomy. Foley catheter in the bladder. Stomach/Bowel: Stomach is within normal limits. Appendix has been removed. No evidence of bowel wall thickening, distention, or inflammatory changes. Vascular/Lymphatic: Aortic atherosclerosis. No enlarged abdominal or pelvic lymph nodes. Reproductive: Numerous benign-appearing calcifications in the prostate gland. Other: No abdominal wall hernia or abnormality. No abdominopelvic ascites. Musculoskeletal: There is abnormal expansion of the muscles of the lateral aspect of the right abdominal wall with lucency in that area, worrisome for an intramuscular abscess. There is soft tissue stranding in the adjacent subcutaneous fat. Soft tissue stranding seen around the distal right latissimus Dorsey muscle and that muscle is slightly hypertrophied as compared to the left and may also be involved infection. There is subcutaneous edema at the lateral aspect of both hips, right more than left. No significant bone abnormality. IMPRESSION: 1. Multiple cavitary  lesions in the left lung consistent with septic emboli. Abnormal soft tissue stranding in the prominent left  pericardial fat pad also worrisome for infection. 2. Abnormal expansion of the muscles of the lateral aspect of the right abdominal wall with lucency in that area, worrisome for an intramuscular abscess/infectious myositis. 3. Soft tissue stranding around the inferior aspect of the right latissimus dorsi muscle and in the subcutaneous fat of both flanks, right greater than left, consistent with cellulitis. 4. New small bilateral pleural effusions, left greater than right. 5. Aortic Atherosclerosis (ICD10-I70.0). 6. New left ureteral stent appears in good position. No acute abnormalities of the left kidney. Electronically Signed   By: Lorriane Shire M.D.   On: 05/15/2019 16:02   MR THORACIC SPINE WO CONTRAST  Result Date: 05/21/2019 CLINICAL DATA:  Sepsis. Irrigation and drainage of right flank abscess yesterday. EXAM: MRI THORACIC AND LUMBAR SPINE WITHOUT CONTRAST TECHNIQUE: Multiplanar and multiecho pulse sequences of the thoracic and lumbar spine were obtained without intravenous contrast. COMPARISON:  Abdominopelvic CT 05/18/2019. Chest CT 05/15/2019. FINDINGS: MRI THORACIC SPINE FINDINGS Alignment:  Normal. Vertebrae: There is a hemangioma within the left aspect of the T7 vertebral body. No evidence of acute fracture, subluxation, discitis or osteomyelitis. The localizing images demonstrate spondylosis in the lower cervical spine without apparent acute abnormality. Cord: The thoracic cord appears normal in signal and caliber. Conus medullaris extends to the L1 level. Paraspinal and other soft tissues: No focal paraspinal abnormalities are identified. There are left-greater-than-right pleural effusions with dependent airspace opacities bilaterally. There are nodular components in both lungs, also similar to previous CT. Disc levels: Detail is mildly degraded by motion artifact. No large disc herniations are demonstrated. There is a small right paracentral disc protrusion at T6-7. There is a slightly larger  right paracentral disc protrusion at T7-8. No cord deformity or significant foraminal narrowing. MRI LUMBAR SPINE FINDINGS Segmentation: There is transitional lumbosacral anatomy. As numbered from above, there is a transitional S1 segment with a vestigial S1-2 disc space. Alignment:  Normal. Vertebrae: There is no evidence of discitis or vertebral body osteomyelitis. However, the right L4-5 facet joint is widened with a joint effusion and surrounding soft tissue edema. There is a posterior epidural fluid collection which extends superiorly from the L4-5 facet joint to the upper L3 level. On the sagittal images, this extends approximately 7.3 cm in length (image 9/1). This fluid collection measures up to 14 x 9 mm transverse (image 22/4) and is highly suspicious for an epidural abscess. There is some mass effect on the thecal sac. No apparent cortical destruction of the posterior elements. Conus medullaris and cauda equina: Conus extends to the L1 level. Paraspinal and other soft tissues: As above, there are paraspinal inflammatory changes around the right L4-5 facet joint with a posterior epidural fluid collection suspicious for an abscess. No paraspinal abscesses are identified. Disc levels: No significant disc space findings at or above L3-4. As described above, the posterior epidural abscess extends superiorly to the level of the upper L3 vertebral body. L4-5: Mild disc bulging. As above, asymmetric right facet joint effusion with paraspinal and posterior right epidural fluid collections highly worrisome for infection. No significant foraminal compromise. L5-S1: Loss of disc height with annular disc bulging and a broad-based central disc protrusion. Mild narrowing of the right lateral recess and mild bilateral facet hypertrophy. The foramina are sufficiently patent. S1-2: Transitional disc space level appears unremarkable. IMPRESSION: 1. Transitional lumbosacral anatomy. As numbered from above, there is a  transitional  S1 segment with a vestigial S1-2 disc space. 2. Probable septic right L4-5 facet joint with paraspinal inflammatory changes and a posterior epidural abscess extending superiorly from the L4-5 facet joint to the upper L3 level. There is some mass effect on the thecal sac. 3. No paraspinal abscesses identified. 4. No evidence of discitis or vertebral body osteomyelitis. 5. Bilateral pleural effusions with nodular components bilaterally consistent with septic emboli, similar to previous CT. 6. These results were called by telephone at the time of interpretation on 05/21/2019 at 2:45 pm to provider [facility] Brealynn Contino , who verbally acknowledged these results. Electronically Signed   By: Richardean Sale M.D.   On: 05/21/2019 14:50   MR LUMBAR SPINE WO CONTRAST  Result Date: 05/21/2019 CLINICAL DATA:  Sepsis. Irrigation and drainage of right flank abscess yesterday. EXAM: MRI THORACIC AND LUMBAR SPINE WITHOUT CONTRAST TECHNIQUE: Multiplanar and multiecho pulse sequences of the thoracic and lumbar spine were obtained without intravenous contrast. COMPARISON:  Abdominopelvic CT 05/18/2019. Chest CT 05/15/2019. FINDINGS: MRI THORACIC SPINE FINDINGS Alignment:  Normal. Vertebrae: There is a hemangioma within the left aspect of the T7 vertebral body. No evidence of acute fracture, subluxation, discitis or osteomyelitis. The localizing images demonstrate spondylosis in the lower cervical spine without apparent acute abnormality. Cord: The thoracic cord appears normal in signal and caliber. Conus medullaris extends to the L1 level. Paraspinal and other soft tissues: No focal paraspinal abnormalities are identified. There are left-greater-than-right pleural effusions with dependent airspace opacities bilaterally. There are nodular components in both lungs, also similar to previous CT. Disc levels: Detail is mildly degraded by motion artifact. No large disc herniations are demonstrated. There is a small right  paracentral disc protrusion at T6-7. There is a slightly larger right paracentral disc protrusion at T7-8. No cord deformity or significant foraminal narrowing. MRI LUMBAR SPINE FINDINGS Segmentation: There is transitional lumbosacral anatomy. As numbered from above, there is a transitional S1 segment with a vestigial S1-2 disc space. Alignment:  Normal. Vertebrae: There is no evidence of discitis or vertebral body osteomyelitis. However, the right L4-5 facet joint is widened with a joint effusion and surrounding soft tissue edema. There is a posterior epidural fluid collection which extends superiorly from the L4-5 facet joint to the upper L3 level. On the sagittal images, this extends approximately 7.3 cm in length (image 9/1). This fluid collection measures up to 14 x 9 mm transverse (image 22/4) and is highly suspicious for an epidural abscess. There is some mass effect on the thecal sac. No apparent cortical destruction of the posterior elements. Conus medullaris and cauda equina: Conus extends to the L1 level. Paraspinal and other soft tissues: As above, there are paraspinal inflammatory changes around the right L4-5 facet joint with a posterior epidural fluid collection suspicious for an abscess. No paraspinal abscesses are identified. Disc levels: No significant disc space findings at or above L3-4. As described above, the posterior epidural abscess extends superiorly to the level of the upper L3 vertebral body. L4-5: Mild disc bulging. As above, asymmetric right facet joint effusion with paraspinal and posterior right epidural fluid collections highly worrisome for infection. No significant foraminal compromise. L5-S1: Loss of disc height with annular disc bulging and a broad-based central disc protrusion. Mild narrowing of the right lateral recess and mild bilateral facet hypertrophy. The foramina are sufficiently patent. S1-2: Transitional disc space level appears unremarkable. IMPRESSION: 1. Transitional  lumbosacral anatomy. As numbered from above, there is a transitional S1 segment with a vestigial S1-2 disc space.  2. Probable septic right L4-5 facet joint with paraspinal inflammatory changes and a posterior epidural abscess extending superiorly from the L4-5 facet joint to the upper L3 level. There is some mass effect on the thecal sac. 3. No paraspinal abscesses identified. 4. No evidence of discitis or vertebral body osteomyelitis. 5. Bilateral pleural effusions with nodular components bilaterally consistent with septic emboli, similar to previous CT. 6. These results were called by telephone at the time of interpretation on 05/21/2019 at 2:45 pm to provider [facility] Goldie Dimmer , who verbally acknowledged these results. Electronically Signed   By: Richardean Sale M.D.   On: 05/21/2019 14:50   US RENAL  Result Date: 05/23/2019 CLINICAL DATA:  Acute kidney injury EXAM: RENAL / URINARY TRACT ULTRASOUND COMPLETE COMPARISON:  CT abdomen and pelvis 05/18/2019 FINDINGS: Right Kidney: Renal measurements: Prior nephrectomy Left Kidney: Renal measurements: 9.6 x 5.9 x 6.0 cm = volume: 181 mL. Mild pelviectasis/hydronephrosis. 12 mm shadowing calcification in the midpole. Normal echotexture Bladder: Bladder wall appears mildly thickened measuring 6 mm anteriorly. Other: None. IMPRESSION: Mild left hydronephrosis/pelviectasis, likely increased since prior CT. Mild bladder wall thickening. Electronically Signed   By: Rolm Baptise M.D.   On: 05/23/2019 11:49   DG Chest Port 1 View  Result Date: 05/23/2019 CLINICAL DATA:  Pleural effusion on the left EXAM: PORTABLE CHEST 1 VIEW COMPARISON:  Yesterday FINDINGS: Unchanged positioning of left pleural catheter with unchanged sizable left pleural effusion. The underlying lung is opacified/atelectatic by CT. Nodular opacity in the left upper lobe is redemonstrated. The right chest is essentially clear. Normal heart size. IMPRESSION: Unchanged sizable and loculated appearing left  pleural effusion despite stably positioned pleural catheter. Electronically Signed   By: Monte Fantasia M.D.   On: 05/21/2019 05:19   DG Chest Port 1 View  Result Date: 05/23/2019 CLINICAL DATA:  Empyema. EXAM: PORTABLE CHEST 1 VIEW COMPARISON:  Radiograph yesterday. CT yesterday. FINDINGS: Left-sided chest tube with tip directed laterally towards the upper lung zone. Decreased left pleural effusion from prior exam. Pulmonary opacities on the left with upper lobe nodules. Pulmonary nodules on the right on CT are not well visualized radiographically. No pneumothorax. IMPRESSION: 1. Left chest tube in place with decreased left pleural effusion. 2. Persistent opacities throughout the left hemithorax, some of which are nodular in correspond to nodules on CT. Electronically Signed   By: Keith Rake M.D.   On: 05/23/2019 05:06   DG Chest Port 1 View  Result Date: 05/22/2019 CLINICAL DATA:  Status post left chest tube placement EXAM: PORTABLE CHEST 1 VIEW COMPARISON:  May 22, 2019 FINDINGS: A left chest tube has been placed in the interval. The left-sided pleural effusion remains but is smaller. There may be some air in the pleural space on the left, introduced from the chest tube placement. No other evidence of pneumothorax. No other acute abnormalities. Opacity underlying the effusion remains. IMPRESSION: A left-sided chest tube has been placed and the left-sided pleural effusion remains but is smaller. There may be some air in the left pleural space, introduced from the chest tube placement. Opacity underlying the left-sided effusion remains, likely atelectasis. Electronically Signed   By: Dorise Bullion III M.D   On: 05/22/2019 18:39   DG Chest Port 1 View  Result Date: 05/22/2019 CLINICAL DATA:  Shortness of breath EXAM: PORTABLE CHEST 1 VIEW COMPARISON:  05/21/2019 FINDINGS: Parents slight increase in moderate to large loculated LEFT pleural effusion noted with increasing LEFT lung  opacities/atelectasis. Slightly increasing mild interstitial opacities  within the RIGHT lung noted. No evidence of pneumothorax. No other significant change. IMPRESSION: 1. Slight increase in moderate to large loculated LEFT pleural effusion with increasing LEFT lung opacities/atelectasis. 2. Slightly increasing mild interstitial opacities within the RIGHT lung which may represent edema or infection. Electronically Signed   By: Margarette Canada M.D.   On: 05/22/2019 14:56   DG Chest Port 1 View  Result Date: 05/19/2019 CLINICAL DATA:  Post left-sided thoracentesis EXAM: PORTABLE CHEST 1 VIEW COMPARISON:  CT abdomen pelvis-05/18/2019; chest CT-05/15/2019; chest radiograph-05/15/2019 FINDINGS: Grossly unchanged cardiac silhouette and mediastinal contours. Interval reduction in persistent small likely partially loculated left-sided effusion post thoracentesis. No pneumothorax. Improved aeration of the left lung base with persistent left basilar heterogeneous/consolidative opacities. Cavitary nodules within the left upper lung are grossly unchanged, better demonstrated on chest CT performed 05/15/2019. The right lung remains well aerated. No definite right-sided pleural effusion. No definite evidence of edema. No acute osseous abnormalities. IMPRESSION: 1. Interval reduction in persistent small likely partially loculated left-sided effusion post thoracentesis. No pneumothorax. 2. Improved aeration of the left lung base with persistent left basilar consolidative opacities, atelectasis versus infiltrate. 3. Similar appearance of cavitary nodules with the left upper lung better demonstrated on recent chest CT again worrisome for septic emboli. Electronically Signed   By: Sandi Mariscal M.D.   On: 05/19/2019 12:30   DG Chest Port 1 View  Result Date: 05/15/2019 CLINICAL DATA:  Pulmonary disease, cough EXAM: PORTABLE CHEST 1 VIEW COMPARISON:  05/12/2019 FINDINGS: Interval endotracheal and esophagogastric extubation. Left  neck vascular catheter, tip projecting over the lower SVC. Diffuse interstitial pulmonary opacity, similar to prior examination. Cardiomegaly. IMPRESSION: 1.  Interval endotracheal and esophagogastric extubation. 2. Diffuse interstitial pulmonary opacity, similar to prior examination, and consistent with edema or infection. No new airspace opacity. 3.  Cardiomegaly. Electronically Signed   By: Eddie Candle M.D.   On: 05/15/2019 15:03   DG Chest Port 1 View  Result Date: 05/12/2019 CLINICAL DATA:  Central line placement. EXAM: PORTABLE CHEST 1 VIEW COMPARISON:  Radiograph earlier this day. FINDINGS: Left internal jugular central venous catheter tip projects over the upper SVC. No pneumothorax. Endotracheal tube tip at the thoracic inlet. The enteric tube is in place, tip below the diaphragm not included in the field of view, side-port in the region of the gastroesophageal junction. Low lung volumes. Unchanged heart size and mediastinal contours. Slight worsening interstitial thickening from prior exam. Increasing nodular airspace disease in the left suprahilar lung and left midlung zone. No definite pleural fluid. IMPRESSION: 1. Left internal jugular central venous catheter tip projects over the upper SVC. No pneumothorax. 2. Slight increase interstitial thickening superimposed on chronic bronchitic change, possible pulmonary edema. More nodular left lung airspace disease is also increasing suspicious for pneumonia. Electronically Signed   By: Keith Rake M.D.   On: 05/12/2019 07:02   DG Chest Port 1 View  Result Date: 05/12/2019 CLINICAL DATA:  Respiratory failure EXAM: PORTABLE CHEST 1 VIEW COMPARISON:  Yesterday FINDINGS: Endotracheal tube tip is just below the clavicular heads. The orogastric tube tip reaches the upper stomach. Artifact from EKG leads that is extensive. Borderline heart size accentuated by low volumes with asymmetric right diaphragm elevation. Generalized interstitial coarsening  and a nodular density over the left upper lobe which was not seen on recent priors. No effusion or pneumothorax. IMPRESSION: 1. Unremarkable hardware positioning. 2. Low volume chest with new nodular airspace opacity in the left upper lobe, possible early pneumonia. 3. Chronic bronchitic  markings. Electronically Signed   By: Monte Fantasia M.D.   On: 05/12/2019 05:11   DG Chest Port 1 View  Result Date: 05/11/2019 CLINICAL DATA:  Respiratory failure EXAM: PORTABLE CHEST 1 VIEW COMPARISON:  Portable exam 1640 hours compared to 1037 hours FINDINGS: Tip of endotracheal tube projects 4.1 cm above carina. Nasogastric tube extends into stomach. Normal heart size, mediastinal contours, and pulmonary vascularity. Bibasilar atelectasis and small LEFT pleural effusion. Diffuse accentuation of interstitial markings, could represent edema or infection. No pneumothorax or acute osseous findings. IMPRESSION: Bibasilar atelectasis and small LEFT pleural effusion with scattered interstitial prominence throughout both lungs question minimal pulmonary edema or infection. Electronically Signed   By: Lavonia Dana M.D.   On: 05/11/2019 16:59   DG Chest Port 1 View  Result Date: 05/11/2019 CLINICAL DATA:  Dyspnea. EXAM: PORTABLE CHEST 1 VIEW COMPARISON:  01/22/2019 FINDINGS: Right IJ Port-A-Cath with tip over the SVC. Lungs are hypoinflated with mild prominence of the central pulmonary vessels suggesting mild vascular congestion. No lobar consolidation or effusion. There is subtle patchy density in the mid lungs as could not exclude possible underlying infection. No pneumothorax. Cardiomediastinal silhouette and remainder of the exam is unchanged. IMPRESSION: Findings suggesting mild vascular congestion. Subtle patchy density in the mid lungs which could represent underlying infection. Right IJ Port-A-Cath with tip over the SVC. Electronically Signed   By: Marin Olp M.D.   On: 05/11/2019 11:10   DG OR UROLOGY CYSTO IMAGE  ([place])  Result Date: 05/11/2019 There is no interpretation for this exam.  This order is for images obtained during a surgical procedure.  Please See "Surgeries" Tab for more information regarding the procedure.   ECHOCARDIOGRAM COMPLETE  Result Date: 05/12/2019   ECHOCARDIOGRAM REPORT   Patient Name:   ALEXIO SROKA Date of Exam: 05/12/2019 Medical Rec #:  [id number]      Height:       72.0 in Accession #:    [id number]     Weight:       198.6 lb Date of Birth:  06-18-1955      BSA:          2.12 m Patient Age:    74 years       BP:           109/60 mmHg Patient Gender: M              HR:           87 bpm. Exam Location:  [facility] Procedure: 2D Echo, Color Doppler and Cardiac Doppler Indications:     R78.81 Bacteremia  History:         Patient has no prior history of Echocardiogram examinations.                  Previous Myocardial Infarction; Stroke and COPD. Schizophrenia.  Sonographer:     Charmayne Sheer RDCS (AE) Referring Phys:  [id number] Bradly Bienenstock Diagnosing Phys: Ida Rogue MD  Sonographer Comments: Echo performed with patient supine and on artificial respirator and suboptimal parasternal window. Image acquisition challenging due to COPD. IMPRESSIONS  1. Left ventricular ejection fraction, by visual estimation, is 60 to 65%. The left ventricle has normal function. There is no left ventricular hypertrophy.  2. Left ventricular diastolic parameters are consistent with Grade I diastolic dysfunction (impaired relaxation).  3. The left ventricle has no regional wall motion abnormalities.  4. Global right ventricle has normal systolic function.The right ventricular size is normal.  No increase in right ventricular wall thickness.  5. Left atrial size was normal.  6. TR signal is inadequate for assessing pulmonary artery systolic pressure.  7. No valve vegetation noted FINDINGS  Left Ventricle: Left ventricular ejection fraction, by visual estimation, is 60 to 65%. The left ventricle has normal  function. The left ventricle has no regional wall motion abnormalities. There is no left ventricular hypertrophy. Left ventricular diastolic parameters are consistent with Grade I diastolic dysfunction (impaired relaxation). Normal left atrial pressure. Right Ventricle: The right ventricular size is normal. No increase in right ventricular wall thickness. Global RV systolic function is has normal systolic function. Left Atrium: Left atrial size was normal in size. Right Atrium: Right atrial size was normal in size Pericardium: There is no evidence of pericardial effusion. Mitral Valve: The mitral valve is normal in structure. No evidence of mitral valve regurgitation. No evidence of mitral valve stenosis by observation. MV peak gradient, 4.5 mmHg. Tricuspid Valve: The tricuspid valve is normal in structure. Tricuspid valve regurgitation is not demonstrated. Aortic Valve: The aortic valve was not well visualized. Aortic valve regurgitation is not visualized. Aortic regurgitation PHT measures 903 msec. The aortic valve is structurally normal, with no evidence of sclerosis or stenosis. Aortic valve mean gradient measures 7.0 mmHg. Aortic valve peak gradient measures 11.7 mmHg. Aortic valve area, by VTI measures 1.79 cm. Pulmonic Valve: The pulmonic valve was normal in structure. Pulmonic valve regurgitation is not visualized. Pulmonic regurgitation is not visualized. Aorta: The aortic root, ascending aorta and aortic arch are all structurally normal, with no evidence of dilitation or obstruction. Venous: The inferior vena cava is normal in size with greater than 50% respiratory variability, suggesting right atrial pressure of 3 mmHg. IAS/Shunts: No atrial level shunt detected by color flow Doppler. There is no evidence of a patent foramen ovale. No ventricular septal defect is seen or detected. There is no evidence of an atrial septal defect.  LEFT VENTRICLE PLAX 2D LVIDd:         4.70 cm  Diastology LVIDs:          2.59 cm  LV e' lateral:   5.98 cm/s LV PW:         0.82 cm  LV E/e' lateral: 12.4 LV IVS:        0.66 cm  LV e' medial:    6.74 cm/s LVOT diam:     1.70 cm  LV E/e' medial:  11.0 LV SV:         78 ml LV SV Index:   36.23 LVOT Area:     2.27 cm  RIGHT VENTRICLE RV Basal diam:  2.93 cm LEFT ATRIUM           Index       RIGHT ATRIUM           Index LA diam:      2.90 cm 1.37 cm/m  RA Area:     10.00 cm LA Vol (A4C): 30.1 ml 14.17 ml/m RA Volume:   19.50 ml  9.18 ml/m  AORTIC VALVE                    PULMONIC VALVE AV Area (Vmax):    1.69 cm     PV Vmax:       0.94 m/s AV Area (Vmean):   1.65 cm     PV Vmean:      61.800 cm/s AV Area (VTI):     1.79 cm  PV VTI:        0.171 m AV Vmax:           171.00 cm/s  PV Peak grad:  3.5 mmHg AV Vmean:          122.000 cm/s PV Mean grad:  2.0 mmHg AV VTI:            0.299 m AV Peak Grad:      11.7 mmHg AV Mean Grad:      7.0 mmHg LVOT Vmax:         127.00 cm/s LVOT Vmean:        88.600 cm/s LVOT VTI:          0.236 m LVOT/AV VTI ratio: 0.79 AI PHT:            903 msec  AORTA Ao Root diam: 3.30 cm MITRAL VALVE MV Area (PHT): 4.80 cm             SHUNTS MV Peak grad:  4.5 mmHg             Systemic VTI:  0.24 m MV Mean grad:  2.0 mmHg             Systemic Diam: 1.70 cm MV Vmax:       1.06 m/s MV Vmean:      61.5 cm/s MV VTI:        0.19 m MV PHT:        45.82 msec MV Decel Time: 158 msec MV E velocity: 74.20 cm/s 103 cm/s MV A velocity: 98.50 cm/s 70.3 cm/s MV E/A ratio:  0.75       1.5  Ida Rogue MD Electronically signed by Ida Rogue MD Signature Date/Time: 05/12/2019/7:11:57 PM    Final    CT Renal Stone Study  Result Date: 05/11/2019 CLINICAL DATA:  Flank pain. Recurrent disease suspected. EXAM: CT ABDOMEN AND PELVIS WITHOUT CONTRAST TECHNIQUE: Multidetector CT imaging of the abdomen and pelvis was performed following the standard protocol without IV contrast. COMPARISON:  CT of the abdomen and pelvis without contrast 3/20/. FINDINGS: Lower chest: Patchy  airspace opacities are present in the lower lobes bilaterally. Heart size is normal. No significant pleural or pericardial effusion present. Hepatobiliary: Hepatic cysts are stable. No new lesions are present. The largest lesion laterally in inferior right lower lobe measuring 18 mm. The common bile duct and gallbladder are normal. Pancreas: Unremarkable. No pancreatic ductal dilatation or surrounding inflammatory changes. Spleen: Normal in size without focal abnormality. Adrenals/Urinary Tract: Adrenal glands are normal bilaterally. Right nephrectomy noted. Prominent staghorn calculus seen on the prior study is near completely resolved. Prominent parenchymal calcification in the midportion of the left kidney measures 11 mm. Punctate calcification near the lower pole of the left kidney is stable. Mild prominence of the left ureter present above the level of a partially obstructing 5 mm stone at pelvic inlet. More distal ureter is normal. The urinary bladder is within limits. Stomach/Bowel: Stomach and duodenum are within normal limits. Bowel is unremarkable. Terminal ileum is normal. The ascending and transverse colon are within normal limits. Descending and sigmoid colon are normal. Vascular/Lymphatic: Atherosclerotic calcifications are present in the aorta and branch vessels without aneurysm. Reproductive: Dense calcifications are present prostate gland. No discrete lesion is present. Other: Choose 1 Musculoskeletal: No acute or significant osseous findings. IMPRESSION: 1. Partially obstructing 5 mm stone at the left pelvic inlet. 2. Mild prominence of the left ureter above the level the stone. 3. Marked reduction  in stone burden in the left kidney. 4. Right nephrectomy. 5. Stable hepatic cysts. 6. Aortic Atherosclerosis (ICD10-I70.0). 7. Patchy airspace disease the lung bases bilaterally raises concern for infection. Electronically Signed   By: San Morelle M.D.   On: 05/11/2019 11:52   ECHO  TEE  Result Date: 05/18/2019   TRANSESOPHOGEAL ECHO REPORT   Patient Name:   DONEL OSOWSKI Date of Exam: 05/18/2019 Medical Rec #:  [id number]      Height:       72.0 in Accession #:    [id number]     Weight:       188.5 lb Date of Birth:  February 24, 1956      BSA:          2.08 m Patient Age:    45 years       BP:           117/72 mmHg Patient Gender: M              HR:           101 bpm. Exam Location:  [facility]  Procedure: Transesophageal Echo, Cardiac Doppler and Color Doppler Indications:     R78.81 Bacteremia  History:         Patient has prior history of Echocardiogram examinations.  Sonographer:     Charmayne Sheer RDCS (AE) Referring Phys:  [id number] PAULA V ROSS Diagnosing Phys: Kate Sable MD  PROCEDURE: After discussion of the risks and benefits of a TEE, an informed consent was obtained Education officer, museum. Local oropharyngeal anesthetic was provided with viscous lidocaine. Patients was under conscious sedation during this procedure. Anesthetic was administered intravenously by performing Physician: 69mcg of Fentanyl, 1.0mg  of Versed. The transesophogeal probe was passed through the esophogus of the patient. Imaged were obtained with the patient in a left lateral decubitus position. Image quality was good. The patient developed no complications during the procedure. IMPRESSIONS  1. Left ventricular ejection fraction, by visual estimation, is 60 to 65%. The left ventricle has normal function. There is no left ventricular hypertrophy.  2. Left ventricular diastolic function could not be evaluated.  3. The left ventricle has no regional wall motion abnormalities.  4. No evidence of vegetation/infection noted in any of the valves or myocardium.  5. Global right ventricle has normal systolic function.The right ventricular size is normal. Right vetricular wall thickness was not assessed.  6. Left atrial size was normal.  7. Right atrial size was normal.  8. The mitral valve is degenerative. Trivial mitral valve  regurgitation.  9. The tricuspid valve is normal in structure. Tricuspid valve regurgitation is not demonstrated. 10. Aortic valve regurgitation is mild to moderate. 11. The aortic valve is tricuspid. Aortic valve regurgitation is mild to moderate. 12. The pulmonic valve was grossly normal. Pulmonic valve regurgitation is not visualized. FINDINGS  Left Ventricle: Left ventricular ejection fraction, by visual estimation, is 60 to 65%. The left ventricle has normal function. The left ventricle has no regional wall motion abnormalities. The left ventricular internal cavity size was the left ventricle is normal in size. There is no left ventricular hypertrophy. Left ventricular diastolic function could not be evaluated. No evidence of vegetation/infection noted in any of the valves or myocardium. Right Ventricle: The right ventricular size is normal. Right vetricular wall thickness was not assessed. Global RV systolic function is has normal systolic function. Left Atrium: Left atrial size was normal in size. Right Atrium: Right atrial size was normal in size Pericardium: There is  no evidence of pericardial effusion. Mitral Valve: The mitral valve is degenerative in appearance. Trivial mitral valve regurgitation. No vegetation noted. Tricuspid Valve: The tricuspid valve is normal in structure. Tricuspid valve regurgitation is not demonstrated. No vegetation noted. Aortic Valve: The aortic valve is tricuspid. Aortic valve regurgitation is mild to moderate. No vegetation noted. Pulmonic Valve: The pulmonic valve was grossly normal. Pulmonic valve regurgitation is not visualized. No vegetation. Aorta: The aortic root and ascending aorta are structurally normal, with no evidence of dilitation. Venous: The inferior vena cava was not well visualized. Shunts: No atrial level shunt detected by color flow Doppler.  Kate Sable MD Electronically signed by Kate Sable MD Signature Date/Time: 05/18/2019/1:38:50 PM     Final    [place] EKG SITE RITE  Result Date: 05/18/2019 If Site Rite image not attached, placement could not be confirmed due to current cardiac rhythm.  US THORACENTESIS ASP PLEURAL SPACE W/IMG GUIDE  Result Date: 05/19/2019 INDICATION: Symptomatic left sided pleural effusion. Concern for septic pulmonary emboli. Please perform ultrasound-guided thoracentesis for diagnostic and therapeutic purposes. EXAM: US THORACENTESIS ASP PLEURAL SPACE W/IMG GUIDE COMPARISON:  Chest CT-05/15/2019 MEDICATIONS: None. COMPLICATIONS: None immediate. TECHNIQUE: Informed written consent was obtained from the patient after a discussion of the risks, benefits and alternatives to treatment. A timeout was performed prior to the initiation of the procedure. Initial ultrasound scanning demonstrates a small to moderate sized complex/loculated left-sided pleural effusion. The lower chest was prepped and draped in the usual sterile fashion. 1% lidocaine was used for local anesthesia. An ultrasound image was saved for documentation purposes. An 8 Fr Safe-T-Centesis catheter was introduced. The thoracentesis was performed. The catheter was removed and a dressing was applied. The patient tolerated the procedure well without immediate post procedural complication. The patient was escorted to have an upright chest radiograph. FINDINGS: A total of approximately 500 cc of slightly brown-colored serous fluid was removed. Requested samples were sent to the laboratory. IMPRESSION: Successful ultrasound-guided left sided thoracentesis yielding 500 cc of slightly brown colored serous pleural fluid. Electronically Signed   By: Sandi Mariscal M.D.   On: 05/19/2019 12:34    Microbiology: Recent Results (from the past 240 hour(s))  Culture, blood (routine x 2)     Status: None   Collection Time: 05/15/19 10:52 PM   Specimen: BLOOD  Result Value Ref Range Status   Specimen Description BLOOD RIGHT ANTECUBITAL  Final   Special Requests   Final     BOTTLES DRAWN AEROBIC AND ANAEROBIC Blood Culture adequate volume   Culture   Final    NO GROWTH 5 DAYS Performed at [facility], [address], [place], [place] [address]    Report Status 05/20/2019 FINAL  Final  Culture, blood (routine x 2)     Status: None   Collection Time: 05/15/19 10:53 PM   Specimen: BLOOD  Result Value Ref Range Status   Specimen Description BLOOD LEFT ANTECUBITAL  Final   Special Requests   Final    BOTTLES DRAWN AEROBIC AND ANAEROBIC Blood Culture adequate volume   Culture   Final    NO GROWTH 5 DAYS Performed at [facility], [address], [place], [place] [address]    Report Status 05/20/2019 FINAL  Final  Urine culture     Status: Abnormal   Collection Time: 05/16/19 12:24 AM   Specimen: Urine, Random  Result Value Ref Range Status   Specimen Description   Final    URINE, RANDOM Performed at [facility], [address] [place], [place] [address]    Special Requests   Final    NONE Performed at [facility], [address] [place], [place] [address]    Culture (A)  Final    >=100,000 COLONIES/mL VANCOMYCIN RESISTANT ENTEROCOCCUS ISOLATED   Report Status 05/18/2019 FINAL  Final   Organism ID, Bacteria VANCOMYCIN RESISTANT ENTEROCOCCUS ISOLATED (A)  Final      Susceptibility   Vancomycin resistant enterococcus isolated - MIC*    AMPICILLIN 8 SENSITIVE Sensitive     NITROFURANTOIN 256 RESISTANT Resistant     VANCOMYCIN >=32 RESISTANT Resistant     LINEZOLID 2 SENSITIVE Sensitive     * >=100,000 COLONIES/mL VANCOMYCIN RESISTANT ENTEROCOCCUS ISOLATED  Body fluid culture     Status: None   Collection Time: 05/19/19 12:02 PM   Specimen: PATH Cytology Pleural fluid  Result Value Ref Range Status   Specimen Description   Final    PLEURAL Performed at [facility], [address], [place], [place] [address]    Special Requests   Final    NONE Performed at [facility], [address] [place], [place] [address]    Gram Stain   Final    ABUNDANT WBC PRESENT,BOTH PMN AND MONONUCLEAR NO ORGANISMS SEEN    Culture   Final    RARE METHICILLIN RESISTANT STAPHYLOCOCCUS AUREUS CRITICAL RESULT CALLED TO, READ BACK BY AND VERIFIED WITH: RN  Dewayne Hatch [id number] FCP Performed at [facility], [address] [place], [place] [address]    Report Status 05/22/2019 FINAL  Final   Organism ID, Bacteria METHICILLIN RESISTANT STAPHYLOCOCCUS AUREUS  Final      Susceptibility   Methicillin resistant staphylococcus aureus - MIC*    CIPROFLOXACIN >=8 RESISTANT Resistant     ERYTHROMYCIN >=8 RESISTANT Resistant     GENTAMICIN <=0.5 SENSITIVE Sensitive     OXACILLIN >=4 RESISTANT Resistant     TETRACYCLINE <=1 SENSITIVE Sensitive     VANCOMYCIN 1 SENSITIVE Sensitive     TRIMETH/SULFA <=10 SENSITIVE Sensitive     CLINDAMYCIN <=0.25 SENSITIVE Sensitive     RIFAMPIN <=0.5 SENSITIVE Sensitive     Inducible Clindamycin NEGATIVE Sensitive     * RARE METHICILLIN RESISTANT STAPHYLOCOCCUS AUREUS  Aerobic/Anaerobic Culture (surgical/deep wound)     Status: None (Preliminary result)   Collection Time: 05/20/19 10:51 AM   Specimen: PATH Other; Body Fluid  Result Value Ref Range Status   Specimen Description   Final    FLUID Performed at [facility], [address], [place], [place] [address]    Special Requests   Final    RIGHT [facility] Performed at [facility], [place], [place] [address]    Gram Stain   Final    ABUNDANT WBC PRESENT, PREDOMINANTLY PMN RARE GRAM POSITIVE COCCI Performed at [facility], [place] [address], [place],  [address]    Culture   Final    RARE METHICILLIN RESISTANT STAPHYLOCOCCUS AUREUS NO ANAEROBES ISOLATED; CULTURE IN PROGRESS FOR 5 DAYS    Report Status PENDING  Incomplete   Organism ID, Bacteria METHICILLIN RESISTANT STAPHYLOCOCCUS AUREUS  Final      Susceptibility   Methicillin resistant  staphylococcus aureus - MIC*    CIPROFLOXACIN >=8 RESISTANT Resistant     ERYTHROMYCIN >=8 RESISTANT Resistant     GENTAMICIN <=0.5 SENSITIVE Sensitive     OXACILLIN >=4 RESISTANT Resistant     TETRACYCLINE <=1  SENSITIVE Sensitive     VANCOMYCIN 1 SENSITIVE Sensitive     TRIMETH/SULFA <=10 SENSITIVE Sensitive     CLINDAMYCIN <=0.25 SENSITIVE Sensitive     RIFAMPIN <=0.5 SENSITIVE Sensitive     Inducible Clindamycin NEGATIVE Sensitive     * RARE METHICILLIN RESISTANT STAPHYLOCOCCUS AUREUS     Labs: CBC: Recent Labs  Lab 05/19/19 0651 05/20/19 0741 05/21/19 0508 05/22/19 0545 05/23/19 0418 05/12/2019 0735  WBC 19.0* 15.9* 16.5* 14.8* 13.2* 10.5  NEUTROABS 14.3*  --   --   --   --   --   HGB 9.7* 9.0* 8.9* 8.9* 8.0* 8.0*  HCT 30.3* 28.2* 27.3* 28.0* 26.5* 25.5*  MCV 89.6 89.5 88.6 88.9 93.3 89.8  PLT 233 282 327 365 378 948*   Basic Metabolic Panel: Recent Labs  Lab 05/17/19 2118 05/17/19 2118 05/18/19 0532 05/19/19 0651 05/20/19 0741 05/21/19 0508 05/22/19 0545 05/22/19 1639 05/23/19 0418 05/23/2019 0735  NA  --    < > 146* 142 139  139 139 140 141 139 138  K 3.4*  --  3.4* 4.3 3.8  3.8 4.4 5.3* 5.3* 5.1 5.2*  CL  --    < > 111 111 111  111 110 111 111 111 111  CO2  --    < > 24 22 19*  19* 20* 22 24 18* 20*  GLUCOSE  --    < > 126* 110* 93  93 90 124* 98 94 89  BUN  --    < > 47* 50* 60*  63* 64* 68* 60* 57* 54*  CREATININE  --    < > 2.64* 2.95* 3.07*  3.08* 3.24* 2.98* 2.92* 2.85* 2.51*  CALCIUM  --    < > 7.7* 7.9* 7.7*  7.4* 7.6* 7.8* 8.0* 7.9* 8.1*  MG 2.1  --  2.0 2.3 2.1  --  2.4  --   --   --   PHOS  --    < > 4.0  --  5.1*  5.0* 5.7* 4.4  --  5.1* 4.3   < > = values in this interval not displayed.   Liver Function Tests: Recent Labs  Lab 05/19/19 0651 05/21/19 0508 05/22/19 0545 05/22/19 1639 05/23/19 0418 05/14/2019 0735  AST 52*  --   --  37  --   --   ALT 27  --   --  26  --   --   ALKPHOS 86  --   --  181*  --   --   BILITOT 4.0*  --    --  1.5*  --   --   PROT 5.6*  --   --  6.4*  --   --   ALBUMIN 1.6* 1.4* 1.5* 1.6* 1.5* 1.5*   No results for input(s): LIPASE, AMYLASE in the last 168 hours. Recent Labs  Lab 05/22/19 1639  AMMONIA 12   Cardiac Enzymes: Recent Labs  Lab 05/18/19 0532  CKTOTAL 41*   BNP (last 3 results) No results for input(s): BNP in the last 8760 hours. CBG: Recent Labs  Lab 05/20/19 0804 05/20/19 1307 05/20/19 1644 05/20/19 2128 05/22/19 1433  GLUCAP 79 81 98 119* 94    Time spent: 35 minutes  Signed:  Berle Mull  [facility]  05/10/2019 5:32 PM

## 2019-05-25 ENCOUNTER — Encounter (HOSPITAL_COMMUNITY): Payer: Self-pay | Admitting: Internal Medicine

## 2019-05-25 ENCOUNTER — Encounter (HOSPITAL_COMMUNITY): Admission: AD | Disposition: E | Payer: Self-pay | Source: Other Acute Inpatient Hospital | Attending: Internal Medicine

## 2019-05-25 ENCOUNTER — Inpatient Hospital Stay: Admit: 2019-05-25 | Payer: Medicare Other | Admitting: Cardiothoracic Surgery

## 2019-05-25 ENCOUNTER — Inpatient Hospital Stay (HOSPITAL_COMMUNITY): Payer: Medicare Other | Admitting: Certified Registered Nurse Anesthetist

## 2019-05-25 ENCOUNTER — Inpatient Hospital Stay (HOSPITAL_COMMUNITY): Payer: Medicare Other

## 2019-05-25 DIAGNOSIS — Z888 Allergy status to other drugs, medicaments and biological substances status: Secondary | ICD-10-CM

## 2019-05-25 DIAGNOSIS — J869 Pyothorax without fistula: Secondary | ICD-10-CM

## 2019-05-25 DIAGNOSIS — J90 Pleural effusion, not elsewhere classified: Secondary | ICD-10-CM

## 2019-05-25 DIAGNOSIS — R7881 Bacteremia: Secondary | ICD-10-CM

## 2019-05-25 DIAGNOSIS — G061 Intraspinal abscess and granuloma: Secondary | ICD-10-CM

## 2019-05-25 DIAGNOSIS — Z87448 Personal history of other diseases of urinary system: Secondary | ICD-10-CM

## 2019-05-25 DIAGNOSIS — F17210 Nicotine dependence, cigarettes, uncomplicated: Secondary | ICD-10-CM

## 2019-05-25 DIAGNOSIS — R918 Other nonspecific abnormal finding of lung field: Secondary | ICD-10-CM

## 2019-05-25 DIAGNOSIS — Z95828 Presence of other vascular implants and grafts: Secondary | ICD-10-CM

## 2019-05-25 DIAGNOSIS — B9562 Methicillin resistant Staphylococcus aureus infection as the cause of diseases classified elsewhere: Secondary | ICD-10-CM

## 2019-05-25 DIAGNOSIS — Z936 Other artificial openings of urinary tract status: Secondary | ICD-10-CM

## 2019-05-25 DIAGNOSIS — I351 Nonrheumatic aortic (valve) insufficiency: Secondary | ICD-10-CM

## 2019-05-25 HISTORY — PX: VIDEO ASSISTED THORACOSCOPY (VATS)/DECORTICATION: SHX6171

## 2019-05-25 LAB — COMPREHENSIVE METABOLIC PANEL
ALT: 34 U/L (ref 0–44)
AST: 45 U/L — ABNORMAL HIGH (ref 15–41)
Albumin: 1.2 g/dL — ABNORMAL LOW (ref 3.5–5.0)
Alkaline Phosphatase: 228 U/L — ABNORMAL HIGH (ref 38–126)
Anion gap: 6 (ref 5–15)
BUN: 50 mg/dL — ABNORMAL HIGH (ref 8–23)
CO2: 20 mmol/L — ABNORMAL LOW (ref 22–32)
Calcium: 8 mg/dL — ABNORMAL LOW (ref 8.9–10.3)
Chloride: 111 mmol/L (ref 98–111)
Creatinine, Ser: 2.62 mg/dL — ABNORMAL HIGH (ref 0.61–1.24)
GFR calc Af Amer: 29 mL/min — ABNORMAL LOW (ref >60)
GFR calc non Af Amer: 25 mL/min — ABNORMAL LOW (ref >60)
Glucose, Bld: 103 mg/dL — ABNORMAL HIGH (ref 70–99)
Potassium: 5.3 mmol/L — ABNORMAL HIGH (ref 3.5–5.1)
Sodium: 137 mmol/L (ref 135–145)
Total Bilirubin: 1 mg/dL (ref 0.3–1.2)
Total Protein: 5.6 g/dL — ABNORMAL LOW (ref 6.5–8.1)

## 2019-05-25 LAB — POCT I-STAT 7, (LYTES, BLD GAS, ICA,H+H)
Acid-base deficit: 6 mmol/L — ABNORMAL HIGH (ref 0.0–2.0)
Bicarbonate: 21.7 mmol/L (ref 20.0–28.0)
Calcium, Ion: 1.21 mmol/L (ref 1.15–1.40)
HCT: 20 % — ABNORMAL LOW (ref 39.0–52.0)
Hemoglobin: 6.8 g/dL — CL (ref 13.0–17.0)
O2 Saturation: 100 %
Potassium: 5.5 mmol/L — ABNORMAL HIGH (ref 3.5–5.1)
Sodium: 140 mmol/L (ref 135–145)
TCO2: 23 mmol/L (ref 22–32)
pCO2 arterial: 53.8 mmHg — ABNORMAL HIGH (ref 32.0–48.0)
pH, Arterial: 7.214 — ABNORMAL LOW (ref 7.350–7.450)
pO2, Arterial: 314 mmHg — ABNORMAL HIGH (ref 83.0–108.0)

## 2019-05-25 LAB — CBC
HCT: 25 % — ABNORMAL LOW (ref 39.0–52.0)
Hemoglobin: 7.6 g/dL — ABNORMAL LOW (ref 13.0–17.0)
MCH: 28.8 pg (ref 26.0–34.0)
MCHC: 30.4 g/dL (ref 30.0–36.0)
MCV: 94.7 fL (ref 80.0–100.0)
Platelets: 400 10*3/uL (ref 150–400)
RBC: 2.64 MIL/uL — ABNORMAL LOW (ref 4.22–5.81)
RDW: 14.7 % (ref 11.5–15.5)
WBC: 9.4 10*3/uL (ref 4.0–10.5)
nRBC: 0 % (ref 0.0–0.2)

## 2019-05-25 LAB — AEROBIC/ANAEROBIC CULTURE W GRAM STAIN (SURGICAL/DEEP WOUND)

## 2019-05-25 LAB — GLUCOSE, CAPILLARY
Glucose-Capillary: 111 mg/dL — ABNORMAL HIGH (ref 70–99)
Glucose-Capillary: 120 mg/dL — ABNORMAL HIGH (ref 70–99)

## 2019-05-25 LAB — ABO/RH: ABO/RH(D): O POS

## 2019-05-25 LAB — APTT: aPTT: 20 seconds — ABNORMAL LOW (ref 24–36)

## 2019-05-25 LAB — PREPARE RBC (CROSSMATCH)

## 2019-05-25 LAB — PROTIME-INR
INR: 1.1 (ref 0.8–1.2)
Prothrombin Time: 14.3 seconds (ref 11.4–15.2)

## 2019-05-25 SURGERY — VIDEO ASSISTED THORACOSCOPY (VATS)/DECORTICATION
Anesthesia: General | Laterality: Left

## 2019-05-25 MED ORDER — PROPOFOL 10 MG/ML IV BOLUS
INTRAVENOUS | Status: AC
  Filled 2019-05-25: qty 20

## 2019-05-25 MED ORDER — BUPIVACAINE LIPOSOME 1.3 % IJ SUSP
INTRAMUSCULAR | Status: DC | PRN
  Administered 2019-05-25: 50 mL

## 2019-05-25 MED ORDER — SENNOSIDES-DOCUSATE SODIUM 8.6-50 MG PO TABS
1.0000 | ORAL_TABLET | Freq: Every day | ORAL | Status: DC
  Administered 2019-05-25 – 2019-05-28 (×3): 1 via ORAL
  Filled 2019-05-25 (×9): qty 1

## 2019-05-25 MED ORDER — FENTANYL CITRATE (PF) 250 MCG/5ML IJ SOLN
INTRAMUSCULAR | Status: DC | PRN
  Administered 2019-05-25 (×2): 50 ug via INTRAVENOUS

## 2019-05-25 MED ORDER — CEFAZOLIN SODIUM-DEXTROSE 2-4 GM/100ML-% IV SOLN
2.0000 g | INTRAVENOUS | Status: AC
  Administered 2019-05-25: 2 g via INTRAVENOUS
  Filled 2019-05-25 (×2): qty 100

## 2019-05-25 MED ORDER — FENTANYL CITRATE (PF) 100 MCG/2ML IJ SOLN
INTRAMUSCULAR | Status: AC
  Filled 2019-05-25: qty 2

## 2019-05-25 MED ORDER — SODIUM CHLORIDE 0.9% IV SOLUTION: Freq: Once | INTRAVENOUS | Status: DC

## 2019-05-25 MED ORDER — ONDANSETRON HCL 4 MG/2ML IJ SOLN
INTRAMUSCULAR | Status: DC | PRN
  Administered 2019-05-25: 4 mg via INTRAVENOUS

## 2019-05-25 MED ORDER — PHENYLEPHRINE HCL-NACL 10-0.9 MG/250ML-% IV SOLN
INTRAVENOUS | Status: DC | PRN
  Administered 2019-05-25: 50 ug/min via INTRAVENOUS

## 2019-05-25 MED ORDER — SODIUM CHLORIDE 0.9 % IV SOLN: INTRAVENOUS | Status: DC | PRN

## 2019-05-25 MED ORDER — SUCCINYLCHOLINE CHLORIDE 20 MG/ML IJ SOLN
INTRAMUSCULAR | Status: DC | PRN
  Administered 2019-05-25: 100 mg via INTRAVENOUS

## 2019-05-25 MED ORDER — ONDANSETRON HCL 4 MG/2ML IJ SOLN: 4.0000 mg | Freq: Once | INTRAMUSCULAR | Status: DC | PRN

## 2019-05-25 MED ORDER — FENTANYL CITRATE (PF) 250 MCG/5ML IJ SOLN
INTRAMUSCULAR | Status: AC
  Filled 2019-05-25: qty 5

## 2019-05-25 MED ORDER — MIDAZOLAM HCL 2 MG/2ML IJ SOLN
INTRAMUSCULAR | Status: AC
  Filled 2019-05-25: qty 2

## 2019-05-25 MED ORDER — FENTANYL CITRATE (PF) 100 MCG/2ML IJ SOLN: 25.0000 ug | INTRAMUSCULAR | Status: DC | PRN

## 2019-05-25 MED ORDER — ONDANSETRON HCL 4 MG/2ML IJ SOLN
4.0000 mg | Freq: Four times a day (QID) | INTRAMUSCULAR | Status: DC | PRN
  Administered 2019-06-04: 4 mg via INTRAVENOUS
  Filled 2019-05-25: qty 2

## 2019-05-25 MED ORDER — CHLORHEXIDINE GLUCONATE CLOTH 2 % EX PADS
6.0000 | MEDICATED_PAD | Freq: Every day | CUTANEOUS | Status: DC
  Administered 2019-05-25 – 2019-06-07 (×14): 6 via TOPICAL

## 2019-05-25 MED ORDER — SUGAMMADEX SODIUM 200 MG/2ML IV SOLN
INTRAVENOUS | Status: DC | PRN
  Administered 2019-05-25: 200 mg via INTRAVENOUS

## 2019-05-25 MED ORDER — EPHEDRINE SULFATE 50 MG/ML IJ SOLN
INTRAMUSCULAR | Status: DC | PRN
  Administered 2019-05-25 (×3): 5 mg via INTRAVENOUS

## 2019-05-25 MED ORDER — ALBUMIN HUMAN 5 % IV SOLN: INTRAVENOUS | Status: DC | PRN

## 2019-05-25 MED ORDER — LIDOCAINE 2% (20 MG/ML) 5 ML SYRINGE
INTRAMUSCULAR | Status: DC | PRN
  Administered 2019-05-25: 60 mg via INTRAVENOUS

## 2019-05-25 MED ORDER — ROCURONIUM BROMIDE 10 MG/ML (PF) SYRINGE
PREFILLED_SYRINGE | INTRAVENOUS | Status: DC | PRN
  Administered 2019-05-25: 50 mg via INTRAVENOUS

## 2019-05-25 MED ORDER — BISACODYL 5 MG PO TBEC
10.0000 mg | DELAYED_RELEASE_TABLET | Freq: Every day | ORAL | Status: DC
  Administered 2019-05-26 – 2019-06-02 (×4): 10 mg via ORAL
  Filled 2019-05-25 (×7): qty 2

## 2019-05-25 MED ORDER — HEMOSTATIC AGENTS (NO CHARGE) OPTIME
TOPICAL | Status: DC | PRN
  Administered 2019-05-25: 1 via TOPICAL

## 2019-05-25 MED ORDER — PROPOFOL 10 MG/ML IV BOLUS
INTRAVENOUS | Status: DC | PRN
  Administered 2019-05-25: 100 mg via INTRAVENOUS

## 2019-05-25 MED ORDER — PHENYLEPHRINE 40 MCG/ML (10ML) SYRINGE FOR IV PUSH (FOR BLOOD PRESSURE SUPPORT)
PREFILLED_SYRINGE | INTRAVENOUS | Status: DC | PRN
  Administered 2019-05-25: 80 ug via INTRAVENOUS

## 2019-05-25 MED ORDER — FENTANYL CITRATE (PF) 100 MCG/2ML IJ SOLN
25.0000 ug | INTRAMUSCULAR | Status: DC | PRN
  Administered 2019-05-25: 18:00:00 50 ug via INTRAVENOUS

## 2019-05-25 MED ORDER — BUPIVACAINE HCL (PF) 0.5 % IJ SOLN
INTRAMUSCULAR | Status: AC
  Filled 2019-05-25: qty 30

## 2019-05-25 MED ORDER — IPRATROPIUM-ALBUTEROL 0.5-2.5 (3) MG/3ML IN SOLN
3.0000 mL | Freq: Four times a day (QID) | RESPIRATORY_TRACT | Status: DC
  Filled 2019-05-25: qty 3

## 2019-05-25 MED ORDER — SODIUM CHLORIDE 0.9 % IV SOLN: INTRAVENOUS | Status: DC

## 2019-05-25 MED ORDER — INSULIN ASPART 100 UNIT/ML ~~LOC~~ SOLN
0.0000 [IU] | Freq: Three times a day (TID) | SUBCUTANEOUS | Status: DC
  Administered 2019-05-26: 23:00:00 2 [IU] via SUBCUTANEOUS
  Administered 2019-05-27: 23:00:00 1 [IU] via SUBCUTANEOUS
  Administered 2019-05-28 – 2019-06-01 (×5): 2 [IU] via SUBCUTANEOUS
  Administered 2019-06-03: 4 [IU] via SUBCUTANEOUS
  Administered 2019-06-03 – 2019-06-04 (×3): 2 [IU] via SUBCUTANEOUS

## 2019-05-25 MED ORDER — SODIUM CHLORIDE 0.9 % IV SOLN
400.0000 mg | Freq: Three times a day (TID) | INTRAVENOUS | Status: DC
  Administered 2019-05-25 – 2019-05-26 (×2): 400 mg via INTRAVENOUS
  Filled 2019-05-25 (×6): qty 400

## 2019-05-25 SURGICAL SUPPLY — 62 items
BENZOIN TINCTURE PRP APPL 2/3 (GAUZE/BANDAGES/DRESSINGS) ×1 IMPLANT
CANISTER SUCT 3000ML PPV (MISCELLANEOUS) ×4 IMPLANT
CATH THOR STR 32F SOFT 20 RADI (CATHETERS) ×1 IMPLANT
CATH THORACIC 36FR RT ANG (CATHETERS) ×1 IMPLANT
CLIP VESOCCLUDE MED 6/CT (CLIP) ×2 IMPLANT
CONN ST 1/4X3/8  BEN (MISCELLANEOUS)
CONN ST 1/4X3/8 BEN (MISCELLANEOUS) IMPLANT
CONN Y 3/8X3/8X3/8  BEN (MISCELLANEOUS)
CONN Y 3/8X3/8X3/8 BEN (MISCELLANEOUS) IMPLANT
CONT SPEC 4OZ CLIKSEAL STRL BL (MISCELLANEOUS) ×5 IMPLANT
COVER SURGICAL LIGHT HANDLE (MISCELLANEOUS) ×2 IMPLANT
DERMABOND ADVANCED (GAUZE/BANDAGES/DRESSINGS) ×1
DERMABOND ADVANCED .7 DNX12 (GAUZE/BANDAGES/DRESSINGS) ×1 IMPLANT
DRAIN CHANNEL 28F RND 3/8 FF (WOUND CARE) ×2 IMPLANT
DRAPE CV SPLIT W-CLR ANES SCRN (DRAPES) ×2 IMPLANT
DRAPE ORTHO SPLIT 77X108 STRL (DRAPES) ×1
DRAPE SURG ORHT 6 SPLT 77X108 (DRAPES) ×1 IMPLANT
ELECT BLADE 6.5 EXT (BLADE) ×2 IMPLANT
ELECT REM PT RETURN 9FT ADLT (ELECTROSURGICAL) ×2
ELECTRODE REM PT RTRN 9FT ADLT (ELECTROSURGICAL) ×1 IMPLANT
GAUZE SPONGE 4X4 12PLY STRL (GAUZE/BANDAGES/DRESSINGS) ×1 IMPLANT
GLOVE NEODERM STRL 7.5 LF PF (GLOVE) ×2 IMPLANT
GLOVE SURG NEODERM 7.5  LF PF (GLOVE) ×2
GOWN STRL REUS W/ TWL LRG LVL3 (GOWN DISPOSABLE) ×2 IMPLANT
GOWN STRL REUS W/TWL LRG LVL3 (GOWN DISPOSABLE) ×2
HANDLE STAPLE ENDO GIA SHORT (STAPLE) ×1
KIT BASIN OR (CUSTOM PROCEDURE TRAY) ×2 IMPLANT
KIT SUCTION CATH 14FR (SUCTIONS) ×2 IMPLANT
KIT TURNOVER KIT B (KITS) ×2 IMPLANT
NDL HYPO 25GX1X1/2 BEV (NEEDLE) IMPLANT
NEEDLE HYPO 25GX1X1/2 BEV (NEEDLE) ×2 IMPLANT
NS IRRIG 1000ML POUR BTL (IV SOLUTION) ×6 IMPLANT
PACK CHEST (CUSTOM PROCEDURE TRAY) ×2 IMPLANT
PAD ARMBOARD 7.5X6 YLW CONV (MISCELLANEOUS) ×4 IMPLANT
RELOAD EGIA 60 TAN VASC (STAPLE) ×2 IMPLANT
SEALANT SURG COSEAL 4ML (VASCULAR PRODUCTS) IMPLANT
SEALANT SURG COSEAL 8ML (VASCULAR PRODUCTS) IMPLANT
SHEARS HARMONIC HDI 20CM (ELECTROSURGICAL) IMPLANT
SOL ANTI FOG 6CC (MISCELLANEOUS) ×1 IMPLANT
SOLUTION ANTI FOG 6CC (MISCELLANEOUS) ×1
SPONGE INTESTINAL PEANUT (DISPOSABLE) ×2 IMPLANT
SPONGE LAP 18X18 X RAY DECT (DISPOSABLE) ×1 IMPLANT
SPONGE TONSIL TAPE 1 RFD (DISPOSABLE) ×2 IMPLANT
STAPLER ENDO GIA 12 SHRT THIN (STAPLE) IMPLANT
STAPLER ENDO GIA 12MM SHORT (STAPLE) ×1 IMPLANT
STRIP CLOSURE SKIN 1/4X4 (GAUZE/BANDAGES/DRESSINGS) ×1 IMPLANT
SUT MNCRL AB 4-0 PS2 18 (SUTURE) ×2 IMPLANT
SUT PROLENE 4 0 RB 1 (SUTURE)
SUT PROLENE 4-0 RB1 .5 CRCL 36 (SUTURE) IMPLANT
SUT SILK  1 MH (SUTURE) ×2
SUT SILK 1 MH (SUTURE) ×2 IMPLANT
SUT VIC AB 0 CTX 27 (SUTURE) ×1 IMPLANT
SUT VIC AB 2-0 CT1 36 (SUTURE) ×1 IMPLANT
SYR 30ML LL (SYRINGE) ×2 IMPLANT
SYSTEM SAHARA CHEST DRAIN ATS (WOUND CARE) ×2 IMPLANT
TAPE CLOTH SURG 6X10 WHT LF (GAUZE/BANDAGES/DRESSINGS) ×1 IMPLANT
TOWEL GREEN STERILE (TOWEL DISPOSABLE) ×2 IMPLANT
TOWEL GREEN STERILE FF (TOWEL DISPOSABLE) ×2 IMPLANT
TRAP SPECIMEN MUCOUS 40CC (MISCELLANEOUS) ×2 IMPLANT
TRAY FOLEY MTR SLVR 16FR STAT (SET/KITS/TRAYS/PACK) ×2 IMPLANT
TROCAR XCEL BLADELESS 5X75MML (TROCAR) ×2 IMPLANT
WATER STERILE IRR 1000ML POUR (IV SOLUTION) ×4 IMPLANT

## 2019-05-25 NOTE — Consult Note (Signed)
[facility] Progress Note  Day of Surgery  Subjective: Patient transferred from [facility] for evaluation by cardiothoracic surgery for empyema. Patient has been hospitalized with disseminated MRSA infection. General surgery asked to see for R flank myositis s/p I&D on 12/18. Patient denies pain or tenderness of R flank. He reports he is passing flatus. Denies nausea or abdominal pain.   Review of Systems  Respiratory: Positive for shortness of breath. Negative for wheezing.   Cardiovascular: Positive for chest pain (soreness in L chest). Negative for palpitations.  Gastrointestinal: Negative for abdominal pain, nausea and vomiting.  Genitourinary: Negative for dysuria, frequency and urgency.  All other systems reviewed and are negative.   Objective: Vital signs in last 24 hours: Temp:  [97.9 F (36.6 C)-99.4 F (37.4 C)] 97.9 F (36.6 C) (12/23 0821) Pulse Rate:  [63-99] 63 (12/23 0928) Resp:  [11-20] 18 (12/23 0928) BP: (120-159)/(70-84) 125/81 (12/23 0821) SpO2:  [94 %-98 %] 95 % (12/23 0821) Weight:  [90.1 kg] 90.1 kg (12/22 2200) Last BM Date: 05/13/2019  Intake/Output from previous day: 12/22 0701 - 12/23 0700 In: 550 [IV Piggyback:550] Out: 35 [Drains:35] Intake/Output this shift: Total I/O In: -  Out: 25 [Drains:25]  Physical Exam  Constitutional: He is well-developed, well-nourished, and in no distress. Vital signs are normal.  HENT:  Head: Normocephalic and atraumatic.  Right Ear: External ear normal.  Left Ear: External ear normal.  Nose: Nose normal.  Eyes: EOM and lids are normal.  Cardiovascular: Normal rate, regular rhythm and intact distal pulses.  Pulmonary/Chest: Effort normal and breath sounds normal.  L chest tube with SS drainage  Abdominal: Soft. Normal appearance and bowel sounds are normal. He exhibits no distension. There is no abdominal tenderness.  R flank with no erythema or induration, no TTP, incision c/d/i with staples present,  drain in R flank with minimal SS drainage  Musculoskeletal:     Cervical back: Normal range of motion and neck supple.     Comments: ROM grossly intact in all 4 extremities  Neurological:  No focal deficit  Skin: Skin is warm and dry.    Lab Results:  Recent Labs    05/14/2019 0735 05/22/2019 0243  WBC 10.5 9.4  HGB 8.0* 7.6*  HCT 25.5* 25.0*  PLT 410* 400   BMET Recent Labs    05/11/2019 0735 05/28/2019 0243  NA 138 137  K 5.2* 5.3*  CL 111 111  CO2 20* 20*  GLUCOSE 89 103*  BUN 54* 50*  CREATININE 2.51* 2.62*  CALCIUM 8.1* 8.0*   PT/INR No results for input(s): LABPROT, INR in the last 72 hours. CMP     Component Value Date/Time   NA 137 05/09/2019 0243   NA 139 09/19/2014 1132   K 5.3 (H) 05/12/2019 0243   K 3.7 09/19/2014 1132   CL 111 05/18/2019 0243   CL 110 09/19/2014 1132   CO2 20 (L) 05/06/2019 0243   CO2 25 09/19/2014 1132   GLUCOSE 103 (H) 05/03/2019 0243   GLUCOSE 72 09/19/2014 1132   BUN 50 (H) 05/23/2019 0243   BUN 39 (H) 09/19/2014 1132   CREATININE 2.62 (H) 05/13/2019 0243   CREATININE 2.30 (H) 09/19/2014 1132   CALCIUM 8.0 (L) 05/03/2019 0243   CALCIUM 8.9 09/19/2014 1132   PROT 5.6 (L) 05/03/2019 0243   PROT 6.8 09/19/2014 1132   ALBUMIN 1.2 (L) 05/23/2019 0243   ALBUMIN 3.5 09/19/2014 1132   AST 45 (H) 05/07/2019 0243   AST 24  09/19/2014 1132   ALT 34 05/20/2019 0243   ALT 15 (L) 09/19/2014 1132   ALKPHOS 228 (H) 05/23/2019 0243   ALKPHOS 108 09/19/2014 1132   BILITOT 1.0 05/24/2019 0243   BILITOT 0.4 09/19/2014 1132   GFRNONAA 25 (L) 05/04/2019 0243   GFRNONAA 30 (L) 09/19/2014 1132   GFRAA 29 (L) 05/07/2019 0243   GFRAA 34 (L) 09/19/2014 1132   Lipase     Component Value Date/Time   LIPASE 16 02/02/2016 1733   LIPASE 111 05/05/2012 1359       Studies/Results: CT CHEST WO CONTRAST  Result Date: 06/01/2019 CLINICAL DATA:  Empyema. History of septic emboli with cavitary lesions. Patient is scheduled for VATS procedure  05/26/2019. EXAM: CT CHEST WITHOUT CONTRAST TECHNIQUE: Multidetector CT imaging of the chest was performed following the standard protocol without IV contrast. COMPARISON:  Chest CT dated 05/22/2019. FINDINGS: Cardiovascular: Heart size appears stable. No thoracic aortic aneurysm. Mild aortic atherosclerosis. Mediastinum/Nodes: Oral contrast within the lower esophagus. Esophagus otherwise unremarkable in appearance. No mass or enlarged lymph nodes appreciated within the mediastinum. Trachea is unremarkable. Lungs/Pleura: LEFT-sided chest tube has been placed in the interval, located within a persistent large pleural effusion on the LEFT which is likely loculated to some degree. There is associated compressive atelectasis w along the course of the LEFT pleural effusion. No RIGHT-sided pleural effusion. Additional atelectasis at the RIGHT lung base. Again noted are scattered pulmonary nodules including a 2 cm LEFT upper lobe nodule (series 4, image 44) and a 1 cm LEFT apical nodule (image 27), posterior RIGHT lower lobe nodules measuring 0.7 cm and 1.3 cm (images 36 and 46 respectively, and a RIGHT lower lobe nodule measuring 1.1 cm (image 62). All of these nodules are similar in size. No new nodules or consolidations identified within either lung. No pneumothorax. Upper Abdomen: Limited images of the upper abdomen are unremarkable. Musculoskeletal: No acute or suspicious osseous finding. IMPRESSION: 1. LEFT-sided chest tube has been placed in the interval, located within a persistent large LEFT pleural effusion which is likely loculated to some degree. Associated compressive atelectasis along the course of the LEFT pleural effusion. 2. Additional atelectasis at the RIGHT lung base. No RIGHT-sided pleural effusion. 3. Stable bilateral pulmonary nodules, as detailed above, compatible with the previous description of septic emboli. No new nodules or consolidations identified within either lung. Aortic Atherosclerosis  (ICD10-I70.0). Electronically Signed   By: Franki Cabot M.D.   On: 05/05/2019 09:36   DG Chest Port 1 View  Result Date: 05/13/2019 CLINICAL DATA:  Pleural effusion on the left EXAM: PORTABLE CHEST 1 VIEW COMPARISON:  Yesterday FINDINGS: Unchanged positioning of left pleural catheter with unchanged sizable left pleural effusion. The underlying lung is opacified/atelectatic by CT. Nodular opacity in the left upper lobe is redemonstrated. The right chest is essentially clear. Normal heart size. IMPRESSION: Unchanged sizable and loculated appearing left pleural effusion despite stably positioned pleural catheter. Electronically Signed   By: Monte Fantasia M.D.   On: 05/21/2019 05:19    Anti-infectives: Anti-infectives (From admission, onward)   Start     Dose/Rate Route Frequency Ordered Stop   05/11/2019 2200  ceftaroline (TEFLARO) 400 mg in sodium chloride 0.9 % 250 mL IVPB     400 mg 250 mL/hr over 60 Minutes Intravenous Every 8 hours 05/05/2019 1050     05/09/2019 2245  ceftaroline (TEFLARO) 600 mg in sodium chloride 0.9 % 250 mL IVPB  Status:  Discontinued     600 mg 250  mL/hr over 60 Minutes Intravenous Every 12 hours 05/27/2019 2231 05/15/2019 2239   05/14/2019 2245  linezolid (ZYVOX) IVPB 600 mg  Status:  Discontinued     600 mg 300 mL/hr over 60 Minutes Intravenous Every 12 hours 06/02/2019 2231 05/24/2019 1050   05/05/2019 2245  ceftaroline (TEFLARO) 400 mg in sodium chloride 0.9 % 250 mL IVPB  Status:  Discontinued     400 mg 250 mL/hr over 60 Minutes Intravenous Every 12 hours 05/10/2019 2239 05/19/2019 1050       Assessment/Plan Pyohydronephrosis due to obstructing calculous A. 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 Christian Mate at [facility] - cx with MRSA, resistant to cipro/oxacillin/erythromycin - WBC 9.4, afebrile - drain with 35 ml of SS appearing drainage - follow drain output, can remove if output remains low and non-purulent - recommend staple removal 06/02/18 - no further general surgery intervention recommended at this time - follow up with surgeon at [facility] after discharge  FEN: NPO - ok to have a diet from general surgery [facility] VTE: SCDs ID: currently on ceftaroline  LOS: 1 day    Brigid Re , [facility] Surgery 05/10/2019, 12:43 PM Please see Amion for pager number during day hours 7:00am-4:30pm

## 2019-05-25 NOTE — Progress Notes (Signed)
PROGRESS NOTE    Alan Small  VWP:[id number] DOB: 10/02/1955 DOA: 05/05/2019 PCP: [name], MD   Brief Narrative:   Alan Small is a 63 y.o. male with medical history significant of solitary left kidney, CKD stage IV, left staghorn calculus SP stenting, urethral stricture SP urethroplasty, schizophrenia, clinically diagnosed COPD, depression.  Patient was admitted at [facility] between February 10, 2019 and March 02, 2019. Patient was treated for pyelonephritis and retroperitoneal fluid collection concerning for abscess. Urine culture grew MDR E. coli. Patient underwent aspiration of the abscess on 9/17 with JP drain placement. Patient was started on IV imipenem with a PICC line. Summary ofhisactive problems in the hospital is as following.Underwent left percutaneous nephrolithotomy 03/28/2019 and ureteral stent placement at the same time. Underwent laser lithotripsy and basket extraction on 04/05/2019. Patient was recommended to continue the antibiotics until April 16, 2019.  Patient presented to [facility] on 05/11/2019 with shortness of breath left leg pain and fever and nausea and vomiting. CT scan showed evidence of stone on the left side. Patient was initially placed on the BiPAP. His oxygenation worsened and patient actually required intubation in the emergency department. Urology was consulted and patient underwent urgent cystoscopy and left ureteral stent placement. Patient was started on broad-spectrum antibiotics and his blood cultures came back positive for MRSA. Patient was extubated on 05/20/2019 and since then has remained on nasal cannula. Patient's PICC line was removed on 05/12/2019 and a central line was placed which was removed on 05/18/2019. Foley catheter was inserted on 05/11/2019 after the urology procedure and removed on 05/18/2019. TEE showed no evidence of endocarditis but the patient does have evidence of septic emboli with cavitary lesions on  bilateral lung parenchyma therefore patient is treated as infective endocarditis. 05/19/2019 left thoracentesis/pleural fluid culture came back positive for MRSA. Report showed loculated effusion post procedure. 05/20/2019 had pyomyositis underwent right-sided drain placement after incision and debridement. 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 Alan Small was consulted who recommended conservative management. Alan Small was on call. Due to worsening shortness of breath CT chest performed on 05/22/2019 showed evidence of recollection of pleural fluid. Chest tube was inserted by PCCM on 05/22/2019. Patient currently in the [facility] evaluation by cardiothoracic surgery as patient is not a candidate for TPA in pleural space due to epidural abscess and may require decortication.   Assessment & Plan:   Principal Problem:   Empyema ([place]) Active Problems:   Schizophrenia ([place])   Hypothyroidism   Severe sepsis (HCC)   Pyohydronephrosis   Kidney stone   Chronic kidney disease, stage IV (severe) (HCC)   COPD (chronic obstructive pulmonary disease) (HCC)   Atrial flutter (HCC)   MRSA bacteremia   Septic embolism (HCC)   Epidural abscess   ETOH abuse   Severe sepsiswith septic shock - MRSA bacteremia likely from previous central line which is removed Acute respiratory failure due to COPD exacerbation in the setting of Severe sepsis -present on admission Pyomyositis SP right-sided flank drain placement empyema SP thoracentesis. And now chest tube placement Epidural abscess as well as L4-L5 infection Presented with septic shock, requiring intubation now resolved SpO2: 95 % O2 Flow Rate (L/min): 1 L/min Blood cultures came back positive for MRSA. Urine culture came back positive for VRE. TEE is  negative for any vegetation  CT chest was positive for evidence of septic emboli therefore patient is being treated as a possible right-sided infective endocarditis. Infectious disease, cardiothoracic surgery following Plan for left-sided VATS with decortication on 06/02/2019 -defer to CT surgery for further management of procedure and chest tube Urology was initially consulted as well which currently has signed off. General surgery is currently following up on the patient. Discussed with Small at [facility] with Alan Small who recommends conservative management with IV antibiotics as long as the patient does not have any symptoms. Infectious disease following, transition to ceftaroline from Teflaro/Zyvox So far repeat blood cultures have been negative.  Obstructing/infected renal calculus, Concurrent VRE UTI, likely POA SP emergency ureteral stent placement had a cystoscopy on 05/11/2019 and also was found to have a bulbar urethral stricture.  ID following, patient transitioned to ceftaroline The stricture was dilated.  The stent was placed in the left ureter.  The urine culture on 05/11/2019 was negative. A repeat urine culture was sent on 05/16/2019 and that is positive for VRE  A. fib/flutter with rapid ventricular rate, resolved -Currently in sinus tach with few PVCs/PACs -was on amiodaron drip, for now on lopressor -Anticoagulation not recommended due to history of fall -Echo with normal LV function  Alcohol withdrawal syndrome, resolved -Monitor for withdrawal -less likely now given timeframe and patient's prolonged hospital stay  Acute on chronic renal failure, improving Solitary kidney Lab Results  Component Value Date   CREATININE 2.62 (H) 05/09/2019   CREATININE 2.51 (H) 05/04/2019   CREATININE 2.85 (H) 05/23/2019  Unknown baseline Peak creatinine at this stay 3.2 (limited chart review baseline around 2.5 with peak of 5 01/22/19)  Paranoid schizophrenia Resume home  medications as soon as feasible   Hypernatremia/hypokalemia Replete and recheck  DVT prophylaxis:heparin SQ  GI prophylax:PPI   DVT prophylaxis: Heparin Code Status: Full Family Communication: None present Disposition Plan: Inpatient, pending further clinical evaluation and improvement.   Consultants:   TCTS  Infectious disease  Neurology  General surgery  Subjective: No acute issues or events overnight, patient feels quite well denies chest pain, other than chest tube insertion site, headache, fevers, chills.  Objective: Vitals:   05/08/2019 2333 05/03/2019 0517 05/17/2019 0521 05/22/2019 0821  BP:  120/70  125/81  Pulse: 96 73 69 77  Resp:  14 11 18   Temp:   98.4 F (36.9 C) 97.9 F (36.6 C)  TempSrc:   Oral Oral  SpO2:  97% 98% 95%  Weight:      Height:        Intake/Output Summary (Last 24 hours) at 05/27/2019 1538 Last data filed at 05/15/2019 0800 Gross per 24 hour  Intake 550 ml  Output 60 ml  Net 490 ml   Filed Weights   05/20/2019 2200  Weight: 90.1 kg    Examination:  General:  Pleasantly resting in bed, No acute distress. HEENT:  Normocephalic atraumatic.  Sclerae nonicteric, noninjected.  Extraocular movements intact bilaterally. Neck:  Without mass or deformity.  Trachea is midline. Lungs: Diminished breath sounds left greater than right with notable rales bilaterally, left chest tube to suction. Heart:  Regular rate and rhythm.  Without murmurs, rubs, or gallops. Abdomen: Soft minimally tender to deep palpation, noted JP drain in ureteral drain without erythema or purulence Extremities: Without cyanosis, clubbing, edema, or obvious deformity. Vascular:  Dorsalis pedis and posterior tibial pulses palpable bilaterally. Skin:  Warm and dry, no erythema, no ulcerations.  Data Reviewed: I have personally reviewed following labs and imaging  studies  CBC: Recent Labs  Lab 05/19/19 0651 05/21/19 0508 05/22/19 0545 05/23/19 0418 05/15/2019 0735  05/19/2019 0243  WBC 19.0* 16.5* 14.8* 13.2* 10.5 9.4  NEUTROABS 14.3*  --   --   --   --   --   HGB 9.7* 8.9* 8.9* 8.0* 8.0* 7.6*  HCT 30.3* 27.3* 28.0* 26.5* 25.5* 25.0*  MCV 89.6 88.6 88.9 93.3 89.8 94.7  PLT 233 327 365 378 410* 322   Basic Metabolic Panel: Recent Labs  Lab 05/19/19 0651 05/20/19 0741 05/21/19 0508 05/22/19 0545 05/22/19 1639 05/23/19 0418 05/09/2019 0735 05/26/2019 0243  NA 142 139  139 139 140 141 139 138 137  K 4.3 3.8  3.8 4.4 5.3* 5.3* 5.1 5.2* 5.3*  CL 111 111  111 110 111 111 111 111 111  CO2 22 19*  19* 20* 22 24 18* 20* 20*  GLUCOSE 110* 93  93 90 124* 98 94 89 103*  BUN 50* 60*  63* 64* 68* 60* 57* 54* 50*  CREATININE 2.95* 3.07*  3.08* 3.24* 2.98* 2.92* 2.85* 2.51* 2.62*  CALCIUM 7.9* 7.7*  7.4* 7.6* 7.8* 8.0* 7.9* 8.1* 8.0*  MG 2.3 2.1  --  2.4  --   --   --   --   PHOS  --  5.1*  5.0* 5.7* 4.4  --  5.1* 4.3  --    GFR: Estimated Creatinine Clearance: 31.3 mL/min (A) (by C-G formula based on SCr of 2.62 mg/dL (H)). Liver Function Tests: Recent Labs  Lab 05/19/19 0651 05/22/19 0545 05/22/19 1639 05/23/19 0418 05/06/2019 0735 05/22/2019 0243  AST 52*  --  37  --   --  45*  ALT 27  --  26  --   --  34  ALKPHOS 86  --  181*  --   --  228*  BILITOT 4.0*  --  1.5*  --   --  1.0  PROT 5.6*  --  6.4*  --   --  5.6*  ALBUMIN 1.6* 1.5* 1.6* 1.5* 1.5* 1.2*   No results for input(s): LIPASE, AMYLASE in the last 168 hours. Recent Labs  Lab 05/22/19 1639  AMMONIA 12   Coagulation Profile: Recent Labs  Lab 05/25/19 1400  INR 1.1   Cardiac Enzymes: No results for input(s): CKTOTAL, CKMB, CKMBINDEX, TROPONINI in the last 168 hours. BNP (last 3 results) No results for input(s): PROBNP in the last 8760 hours. HbA1C: No results for input(s): HGBA1C in the last 72 hours. CBG: Recent Labs  Lab 05/20/19 0804 05/20/19 1307 05/20/19 1644 05/20/19 2128 05/22/19 1433  GLUCAP 79 81 98 119* 94   Lipid Profile: No results for input(s):  CHOL, HDL, LDLCALC, TRIG, CHOLHDL, LDLDIRECT in the last 72 hours. Thyroid Function Tests: Recent Labs    05/22/19 1639  TSH 9.012*  FREET4 0.73   Anemia Panel: Recent Labs    05/22/19 1639  VITAMINB12 618   Sepsis Labs: No results for input(s): PROCALCITON, LATICACIDVEN in the last 168 hours.  Recent Results (from the past 240 hour(s))  Culture, blood (routine x 2)     Status: None   Collection Time: 05/15/19 10:52 PM   Specimen: BLOOD  Result Value Ref Range Status   Specimen Description BLOOD RIGHT ANTECUBITAL  Final   Special Requests   Final    BOTTLES DRAWN AEROBIC AND ANAEROBIC Blood Culture adequate volume   Culture   Final    NO GROWTH 5 DAYS Performed at [facility],  [place], [place] [address]    Report Status 05/20/2019 FINAL  Final  Culture, blood (routine x 2)     Status: None   Collection Time: 05/15/19 10:53 PM   Specimen: BLOOD  Result Value Ref Range Status   Specimen Description BLOOD LEFT ANTECUBITAL  Final   Special Requests   Final    BOTTLES DRAWN AEROBIC AND ANAEROBIC Blood Culture adequate volume   Culture   Final    NO GROWTH 5 DAYS Performed at [facility], [address], [place], [place] [address]    Report Status 05/20/2019 FINAL  Final  Urine culture     Status: Abnormal   Collection Time: 05/16/19 12:24 AM   Specimen: Urine, Random  Result Value Ref Range Status   Specimen Description   Final    URINE, RANDOM Performed at [facility], [address] [place], [place] [address]    Special Requests   Final    NONE Performed at [facility], [address] [place], [place] [address]    Culture (A)  Final    >=100,000 COLONIES/mL VANCOMYCIN RESISTANT ENTEROCOCCUS ISOLATED   Report Status 05/18/2019 FINAL  Final   Organism ID, Bacteria VANCOMYCIN RESISTANT ENTEROCOCCUS ISOLATED (A)  Final      Susceptibility   Vancomycin resistant enterococcus isolated - MIC*    AMPICILLIN  8 SENSITIVE Sensitive     NITROFURANTOIN 256 RESISTANT Resistant     VANCOMYCIN >=32 RESISTANT Resistant     LINEZOLID 2 SENSITIVE Sensitive     * >=100,000 COLONIES/mL VANCOMYCIN RESISTANT ENTEROCOCCUS ISOLATED  Body fluid culture     Status: None   Collection Time: 05/19/19 12:02 PM   Specimen: PATH Cytology Pleural fluid  Result Value Ref Range Status   Specimen Description   Final    PLEURAL Performed at [facility], [address], [place], [place] [address]    Special Requests   Final    NONE Performed at [facility], [address] [place], [place] [address]    Gram Stain   Final    ABUNDANT WBC PRESENT,BOTH PMN AND MONONUCLEAR NO ORGANISMS SEEN    Culture   Final    RARE METHICILLIN RESISTANT STAPHYLOCOCCUS AUREUS CRITICAL RESULT CALLED TO, READ BACK BY AND VERIFIED WITH: RN  Dewayne Hatch [id number] FCP Performed at [facility], [place] [address], [place], [place] [address]    Report Status 05/22/2019 FINAL  Final   Organism ID, Bacteria METHICILLIN RESISTANT STAPHYLOCOCCUS AUREUS  Final      Susceptibility   Methicillin resistant staphylococcus aureus - MIC*    CIPROFLOXACIN >=8 RESISTANT Resistant     ERYTHROMYCIN >=8 RESISTANT Resistant     GENTAMICIN <=0.5 SENSITIVE Sensitive     OXACILLIN >=4 RESISTANT Resistant     TETRACYCLINE <=1 SENSITIVE Sensitive     VANCOMYCIN 1 SENSITIVE Sensitive     TRIMETH/SULFA <=10 SENSITIVE Sensitive     CLINDAMYCIN <=0.25 SENSITIVE Sensitive     RIFAMPIN <=0.5 SENSITIVE Sensitive     Inducible Clindamycin NEGATIVE Sensitive     * RARE METHICILLIN RESISTANT STAPHYLOCOCCUS AUREUS  Aerobic/Anaerobic Culture (surgical/deep wound)     Status: None   Collection Time: 05/20/19 10:51 AM   Specimen: PATH Other; Body Fluid  Result Value Ref Range Status   Specimen Description   Final    FLUID Performed at [facility], [address] [place], [place] [address]    Special Requests  Final     RIGHT FLANK Performed at [facility], [address] [place], [place] [address]    Gram Stain   Final    ABUNDANT WBC PRESENT, PREDOMINANTLY PMN RARE GRAM POSITIVE COCCI    Culture   Final    RARE METHICILLIN RESISTANT STAPHYLOCOCCUS AUREUS NO ANAEROBES ISOLATED Performed at [facility], [place] [address] [place], [place] [address]    Report Status 05/11/2019 FINAL  Final   Organism ID, Bacteria METHICILLIN RESISTANT STAPHYLOCOCCUS AUREUS  Final      Susceptibility   Methicillin resistant staphylococcus aureus - MIC*    CIPROFLOXACIN >=8 RESISTANT Resistant     ERYTHROMYCIN >=8 RESISTANT Resistant     GENTAMICIN <=0.5 SENSITIVE Sensitive     OXACILLIN >=4 RESISTANT Resistant     TETRACYCLINE <=1 SENSITIVE Sensitive     VANCOMYCIN 1 SENSITIVE Sensitive     TRIMETH/SULFA <=10 SENSITIVE Sensitive     CLINDAMYCIN <=0.25 SENSITIVE Sensitive     RIFAMPIN <=0.5 SENSITIVE Sensitive     Inducible Clindamycin NEGATIVE Sensitive     * RARE METHICILLIN RESISTANT STAPHYLOCOCCUS AUREUS         Radiology Studies: CT CHEST WO CONTRAST  Result Date: 05/31/2019 CLINICAL DATA:  Empyema. History of septic emboli with cavitary lesions. Patient is scheduled for VATS procedure 05/26/2019. EXAM: CT CHEST WITHOUT CONTRAST TECHNIQUE: Multidetector CT imaging of the chest was performed following the standard protocol without IV contrast. COMPARISON:  Chest CT dated 05/22/2019. FINDINGS: Cardiovascular: Heart size appears stable. No thoracic aortic aneurysm. Mild aortic atherosclerosis. Mediastinum/Nodes: Oral contrast within the lower esophagus. Esophagus otherwise unremarkable in appearance. No mass or enlarged lymph nodes appreciated within the mediastinum. Trachea is unremarkable. Lungs/Pleura: LEFT-sided chest tube has been placed in the interval, located within a persistent large pleural effusion on the LEFT which is likely loculated to some degree. There is associated compressive  atelectasis w along the course of the LEFT pleural effusion. No RIGHT-sided pleural effusion. Additional atelectasis at the RIGHT lung base. Again noted are scattered pulmonary nodules including a 2 cm LEFT upper lobe nodule (series 4, image 44) and a 1 cm LEFT apical nodule (image 27), posterior RIGHT lower lobe nodules measuring 0.7 cm and 1.3 cm (images 36 and 46 respectively, and a RIGHT lower lobe nodule measuring 1.1 cm (image 62). All of these nodules are similar in size. No new nodules or consolidations identified within either lung. No pneumothorax. Upper Abdomen: Limited images of the upper abdomen are unremarkable. Musculoskeletal: No acute or suspicious osseous finding. IMPRESSION: 1. LEFT-sided chest tube has been placed in the interval, located within a persistent large LEFT pleural effusion which is likely loculated to some degree. Associated compressive atelectasis along the course of the LEFT pleural effusion. 2. Additional atelectasis at the RIGHT lung base. No RIGHT-sided pleural effusion. 3. Stable bilateral pulmonary nodules, as detailed above, compatible with the previous description of septic emboli. No new nodules or consolidations identified within either lung. Aortic Atherosclerosis (ICD10-I70.0). Electronically Signed   By: Franki Cabot M.D.   On: 05/22/2019 09:36   DG Chest Port 1 View  Result Date: 05/30/2019 CLINICAL DATA:  Pleural effusion on the left EXAM: PORTABLE CHEST 1 VIEW COMPARISON:  Yesterday FINDINGS: Unchanged positioning of left pleural catheter with unchanged sizable left pleural effusion. The underlying lung is opacified/atelectatic by CT. Nodular opacity in the left upper lobe is redemonstrated. The right chest is essentially clear. Normal heart size. IMPRESSION: Unchanged sizable and loculated appearing left  pleural effusion despite stably positioned pleural catheter. Electronically Signed   By: Monte Fantasia M.D.   On: 05/15/2019 05:19    Scheduled Meds: .  sodium chloride   Intravenous Once  . [MAR Hold] feeding supplement (ENSURE ENLIVE)  237 mL Oral TID BM  . [MAR Hold] ipratropium-albuterol  3 mL Nebulization TID  . [MAR Hold] levothyroxine  50 mcg Oral QAC breakfast  . [MAR Hold] metoprolol tartrate  25 mg Oral BID  . [MAR Hold] multivitamin with minerals  1 tablet Oral Daily  . [MAR Hold] nicotine  21 mg Transdermal Daily   Continuous Infusions: . [MAR Hold] ceftaroline (TEFLARO) 400 mg IVPB       LOS: 1 day   Time spent: 36min  Patrina Andreas C Aloha Bartok, DO [facility]  If 7PM-7AM, please contact night-coverage [email or web address] Password [facility] 05/14/2019, 3:38 PM

## 2019-05-25 NOTE — H&P (Signed)
History and Physical Interval Note:  05/27/2019 3:17 PM  Alan Small  has presented today for surgery, with the diagnosis of LEFT LOCULATED EFFUSION.  The various methods of treatment have been discussed with the patient and family. After consideration of risks, benefits and other options for treatment, the patient has consented to  Procedure(s): VIDEO ASSISTED THORACOSCOPY (VATS)/DECORTICATION (Left) EMPYEMA DRAINAGE (Left) as a surgical intervention.  The patient's history has been reviewed, patient examined, no change in status, stable for surgery.  I have reviewed the patient's chart and labs.  Questions were answered to the patient's satisfaction.     Alan Small

## 2019-05-25 NOTE — Anesthesia Procedure Notes (Signed)
Arterial Line Insertion Start/End12/28/2020 2:30 PM, 05/25/2019 2:35 PM Performed by: Amadeo Garnet, CRNA, CRNA  Patient location: Pre-op. Preanesthetic checklist: patient identified, IV checked, site marked, risks and benefits discussed, surgical consent, monitors and equipment checked, pre-op evaluation and timeout performed Patient sedated Left, radial was placed Catheter size: 20 G Hand hygiene performed  and maximum sterile barriers used   Attempts: 1 Procedure performed without using ultrasound guided technique. Following insertion, dressing applied and Biopatch. Patient tolerated the procedure well with no immediate complications.

## 2019-05-25 NOTE — Brief Op Note (Addendum)
05/12/2019 - 05/26/2019  4:59 PM  PATIENT:  Marguarite Arbour  63 y.o. male  PRE-OPERATIVE DIAGNOSIS:  LEFT LOCULATED EFFUSION  POST-OPERATIVE DIAGNOSIS:  LEFT LOCULATED PLEURAL EFFUSION  PROCEDURE:  Procedure(s): LEFT VIDEO ASSISTED THORACOSCOPY (VATS)/DECORTICATION; LEFT LOWER LOBE WEDGE RESECTION; DRAINAGE OF EMPYEMA (Left)  SURGEON:  Surgeon(s) and Role:    * Wonda Olds, MD - Primary  PHYSICIAN ASSISTANT: [place]   ANESTHESIA:   general  EBL:  Per anesthesia record   BLOOD ADMINISTERED:none  DRAINS: 77fr straight pleural tube and 40fr right angle pleural tube   LOCAL MEDICATIONS USED:  BUPIVICAINE   SPECIMEN:  Source of Specimen:  Pleural fluid for culture and Grams stain.  DISPOSITION OF SPECIMEN:  Lab  COUNTS:  YES  TOURNIQUET:  * No tourniquets in log *  DICTATION: .Dragon Dictation  PLAN OF CARE: Admit to inpatient   PATIENT DISPOSITION:  PACU - hemodynamically stable.   Delay start of Pharmacological VTE agent (>24hrs) due to surgical blood loss or risk of bleeding: yes Agree with note; I performed procedure. Pierra Skora Z. Orvan Seen, [place]

## 2019-05-25 NOTE — Progress Notes (Signed)
PHARMACY NOTE:  ANTIMICROBIAL RENAL DOSAGE ADJUSTMENT  Current antimicrobial regimen includes a mismatch between antimicrobial dosage and estimated renal function.  As per policy approved by the Pharmacy & Therapeutics and Medical Executive Committees, the antimicrobial dosage will be adjusted accordingly.  Current antimicrobial dosage:  Ceftaroline 400 mg BID  Indication: Disseminated MRSA infection   Renal Function:  Estimated Creatinine Clearance: 31.3 mL/min (A) (by C-G formula based on SCr of 2.62 mg/dL (H)). []      On intermittent HD, scheduled: []      On CRRT    Antimicrobial dosage has been changed to:  Ceftaroline 400mg  q 8 hours  Additional comments:Increasing dosage for severe disseminated MRSA infection and improving renal function    Thank you for allowing pharmacy to be a part of this patient's care.  Jimmy Footman, PharmD, BCPS, BCIDP Infectious Diseases Clinical Pharmacist Phone: [phone] 05/06/2019 1:11 PM

## 2019-05-25 NOTE — Anesthesia Procedure Notes (Signed)
Procedure Name: Intubation Date/Time: 05/05/2019 3:53 PM Performed by: Amadeo Garnet, CRNA Pre-anesthesia Checklist: Patient identified, Emergency Drugs available, Suction available and Patient being monitored Patient Re-evaluated:Patient Re-evaluated prior to induction Oxygen Delivery Method: Circle system utilized Preoxygenation: Pre-oxygenation with 100% oxygen Induction Type: IV induction Ventilation: Mask ventilation with difficulty, Oral airway inserted - appropriate to patient size and Two handed mask ventilation required Laryngoscope Size: Mac and 4 Grade View: Grade I Endobronchial tube: Left and Double lumen EBT and 39 Fr Number of attempts: 1 Airway Equipment and Method: Stylet and Fiberoptic brochoscope Placement Confirmation: ETT inserted through vocal cords under direct vision,  positive ETCO2 and breath sounds checked- equal and bilateral Tube secured with: Tape Dental Injury: Teeth and Oropharynx as per pre-operative assessment

## 2019-05-25 NOTE — Consult Note (Signed)
[facility]    Date of Admission:  05/14/2019     Total days of antibiotics 14d               Reason for Consult: MRSA bacteremia with disseminated infecition     Referring Provider: Avon Gully  Primary Care Provider: [name], MD    Assessment: Alan Small is a 63 y.o. male with complex urologic history here with disseminated MRSA infection including left lung empyema, posterior epidural abscess from L4-5 extending to upper L3 with some mass effect in thecal sac; probable right septic facet @ L4-5. He has no neurologic deficits on exam in bed today and can lift legs against gravity and light resistance bilaterally. Repeat chest CT scan indicating scattered pulmonary nodules, persistent large left pleural effusion that is likely loculated to some degree.  Presume nidus to have been his PICC line  TEE without any vegetations seen - AV regurg mild/moderate no TV vegetation   Will change to ceftaroline IV alone high dose to treat in consideration of limiting nephrotoxins and treating pulmonary space.   VRE in Urine with H/O ESBL EColi - likely colonized stone. Treated previously with dapto + linezolid.    Plan: 1. TCTS planning VATS/decortication today  2. Will need long course of therapy once sources controlled  3. Change to ceftaroline IV   Principal Problem:   Empyema (HCC) Active Problems:   Schizophrenia ([place])   Hypothyroidism   Severe sepsis (HCC)   Pyohydronephrosis   Kidney stone   Chronic kidney disease, stage IV (severe) (HCC)   COPD (chronic obstructive pulmonary disease) (HCC)   Atrial flutter (HCC)   MRSA bacteremia   Septic embolism (HCC)   Epidural abscess   ETOH abuse   . [START ON 05/26/2019] feeding supplement (ENSURE ENLIVE)  237 mL Oral TID BM  . ipratropium-albuterol  3 mL Nebulization TID  . levothyroxine  50 mcg Oral QAC breakfast  . metoprolol tartrate  25 mg Oral BID  . multivitamin with  minerals  1 tablet Oral Daily  . nicotine  21 mg Transdermal Daily    HPI: Alan Small is a 63 y.o. male with h/o left hydronephrosis, at one time had percutaneous nephrostomy, stent, complicated by retroperitoneal abscess due to E. coli and Enterococcus and had been treated at [facility]. Hospitalized 9-10 - 03/02/2019 for this requiring treatment with imipenem IV therapy x 8 weeks via tunneled PICC right chest. He concluded his IV antibiotics on 10/28 per [facility] chart but may have been ongoing through November 14th per other chart notes. However the PICC line was not removed following this from what I understand. Was last seen by [facility] 11/11 for cystoscopy and stent removal.   He [facility] on [facility]/09 with SOB and left leg pain, fever and N/V. CT scan revealed calculi on the left again. He required intubation at this time. Urgent cystoscopy was done for left uretral stent placement as well; blood cultures returned positive MRSA. He was extubated 12/18. Urine cultures on 12/09 negative but 12/14 + VRE. He was treated with Daptomycin while at [facility]. Underwent left thoracentesis 12/17; he has been in and out of ICU for agitation and SOB. Chest tube on the left was placed ~12/20 as the repeat CT of the chest was done d/t worsening SOB   S/P I/D of large submuscular abscess cavity on the right flank 12/18.   MRI of the T-/L-  spine 12/19was done revealing epidural abscess + facet joint infection. [facility] neurosurgery consulted who recommended medical management. IN discussion with the patient he states he had falls prior to hospital stay where he "just collapsed."   He was transferred to [facility] for CT surgery evaluation of complex pleural collection management.   In discussion with Alan Small he actually has little symptoms. His breathing is a little better but requiring O2 still. He does have some pain in the lower abdomen/hip/groin areas bilaterally when he turns from side to side. Can raise legs off the bed  to gravity.   CT chest today -  1. LEFT-sided chest tube has been placed in the interval, located within a persistent large LEFT pleural effusion which is likely loculated to some degree. Associated compressive atelectasis along the course of the LEFT pleural effusion. 2. Additional atelectasis at the RIGHT lung base. No RIGHT-sided pleural effusion. 3. Stable bilateral pulmonary nodules, as detailed above, compatible with the previous description of septic emboli. No new nodules or consolidations identified within either lung.   Review of Systems: Review of Systems  Constitutional: Negative for chills, fever and malaise/fatigue.  Respiratory: Positive for cough and shortness of breath. Negative for sputum production.   Cardiovascular: Negative for chest pain and leg swelling.  Gastrointestinal: Negative for abdominal pain, diarrhea and vomiting.  Genitourinary: Negative for dysuria and frequency.  Musculoskeletal: Positive for back pain, falls and joint pain. Negative for myalgias.  Skin: Negative for rash.  Neurological: Positive for weakness. Negative for dizziness, sensory change and headaches.    Past Medical History:  Diagnosis Date  . Asthma   . COPD (chronic obstructive pulmonary disease) ([place])   . Depression   . External hemorrhoids   . Gastric ulcer   . GERD (gastroesophageal reflux disease)   . Hydronephrosis   . Hypothyroidism    hypo  . Insomnia   . Mental disorder   . Myocardial infarction ([place])   . Paranoid schizophrenia ([place])   . Renal disorder    renal failure  . Seizures ([place])   . Stroke [facility])     Social History   Tobacco Use  . Smoking status: Current Every Day Smoker    Packs/day: 1.00    Years: 55.00    Pack years: 55.00    Types: Cigarettes  . Smokeless tobacco: Former Systems developer    Types: Chew  Substance Use Topics  . Alcohol use: No  . Drug use: No    Family History  Problem Relation Age of Onset  . Stroke Father   . Pneumonia Father    . Brain cancer Mother    Allergies  Allergen Reactions  . Mellaril [Thioridazine] Shortness Of Breath and Swelling  . Buprenorphine Itching  . Morphine And Related Itching  . Navane [Thiothixene] Other (See Comments)    Reaction:  GI upset   . Thorazine [Chlorpromazine] Other (See Comments)    Reaction:  Dizziness/fainting     OBJECTIVE: Blood pressure 125/81, pulse 77, temperature 97.9 F (36.6 C), temperature source Oral, resp. rate 18, height 6' (1.829 m), weight 90.1 kg, SpO2 95 %.  Physical Exam Constitutional:      Comments: Resting in bed following breathing treatment. No distress.   HENT:     Mouth/Throat:     Mouth: Mucous membranes are moist.     Pharynx: Oropharynx is clear. No oropharyngeal exudate.  Eyes:     General: No scleral icterus. Cardiovascular:     Rate and Rhythm: Normal rate  and regular rhythm.     Heart sounds: No murmur.  Pulmonary:     Effort: Pulmonary effort is normal.     Breath sounds: Normal breath sounds.     Comments: Some upper airway noise. Nasal cannula oxygen. Left chest tube in place 900cc total with  serous fluid in tubing Abdominal:     General: Bowel sounds are normal.     Palpations: Abdomen is soft.  Genitourinary:    Comments: R JP drain to flank in place with no fluid in bulb Skin:    General: Skin is warm and dry.     Capillary Refill: Capillary refill takes less than 2 seconds.     Comments: Multiple bruises on arms. Incision over right lower abd/flank intact with staples remaining.  R CVC line bandage clean and dry. No tenderness.   Neurological:     Mental Status: He is oriented to person, place, and time.     Lab Results Lab Results  Component Value Date   WBC 9.4 05/27/2019   HGB 7.6 (L) 05/08/2019   HCT 25.0 (L) 05/21/2019   MCV 94.7 06/02/2019   PLT 400 05/12/2019    Lab Results  Component Value Date   CREATININE 2.62 (H) 05/27/2019   BUN 50 (H) 05/31/2019   NA 137 05/08/2019   K 5.3 (H) 05/26/2019    CL 111 05/16/2019   CO2 20 (L) 05/19/2019    Lab Results  Component Value Date   ALT 34 05/13/2019   AST 45 (H) 05/15/2019   ALKPHOS 228 (H) 05/18/2019   BILITOT 1.0 05/30/2019     Microbiology: Recent Results (from the past 240 hour(s))  Culture, blood (routine x 2)     Status: None   Collection Time: 05/15/19 10:52 PM   Specimen: BLOOD  Result Value Ref Range Status   Specimen Description BLOOD RIGHT ANTECUBITAL  Final   Special Requests   Final    BOTTLES DRAWN AEROBIC AND ANAEROBIC Blood Culture adequate volume   Culture   Final    NO GROWTH 5 DAYS Performed at [facility], [address], [place], [place] [address]    Report Status 05/20/2019 FINAL  Final  Culture, blood (routine x 2)     Status: None   Collection Time: 05/15/19 10:53 PM   Specimen: BLOOD  Result Value Ref Range Status   Specimen Description BLOOD LEFT ANTECUBITAL  Final   Special Requests   Final    BOTTLES DRAWN AEROBIC AND ANAEROBIC Blood Culture adequate volume   Culture   Final    NO GROWTH 5 DAYS Performed at [facility], [address], [place], [place] [address]    Report Status 05/20/2019 FINAL  Final  Urine culture     Status: Abnormal   Collection Time: 05/16/19 12:24 AM   Specimen: Urine, Random  Result Value Ref Range Status   Specimen Description   Final    URINE, RANDOM Performed at [facility], [address], [place], [place] [address]    Special Requests   Final    NONE Performed at [facility], [place], [address] [place], [place] [address]    Culture (A)  Final    >=100,000 COLONIES/mL VANCOMYCIN RESISTANT ENTEROCOCCUS ISOLATED   Report Status 05/18/2019 FINAL  Final   Organism ID, Bacteria VANCOMYCIN RESISTANT ENTEROCOCCUS ISOLATED (A)  Final      Susceptibility   Vancomycin resistant enterococcus isolated - MIC*    AMPICILLIN 8 SENSITIVE Sensitive  NITROFURANTOIN 256 RESISTANT Resistant     VANCOMYCIN >=32 RESISTANT  Resistant     LINEZOLID 2 SENSITIVE Sensitive     * >=100,000 COLONIES/mL VANCOMYCIN RESISTANT ENTEROCOCCUS ISOLATED  Body fluid culture     Status: None   Collection Time: 05/19/19 12:02 PM   Specimen: PATH Cytology Pleural fluid  Result Value Ref Range Status   Specimen Description   Final    PLEURAL Performed at [facility], [address] [place], [place] [address]    Special Requests   Final    NONE Performed at [facility], [address] [place], [place] [address]    Gram Stain   Final    ABUNDANT WBC PRESENT,BOTH PMN AND MONONUCLEAR NO ORGANISMS SEEN    Culture   Final    RARE METHICILLIN RESISTANT STAPHYLOCOCCUS AUREUS CRITICAL RESULT CALLED TO, READ BACK BY AND VERIFIED WITH: RN  Dewayne Hatch [phone] FCP Performed at [facility], [address] [place], [place] [address]    Report Status 05/22/2019 FINAL  Final   Organism ID, Bacteria METHICILLIN RESISTANT STAPHYLOCOCCUS AUREUS  Final      Susceptibility   Methicillin resistant staphylococcus aureus - MIC*    CIPROFLOXACIN >=8 RESISTANT Resistant     ERYTHROMYCIN >=8 RESISTANT Resistant     GENTAMICIN <=0.5 SENSITIVE Sensitive     OXACILLIN >=4 RESISTANT Resistant     TETRACYCLINE <=1 SENSITIVE Sensitive     VANCOMYCIN 1 SENSITIVE Sensitive     TRIMETH/SULFA <=10 SENSITIVE Sensitive     CLINDAMYCIN <=0.25 SENSITIVE Sensitive     RIFAMPIN <=0.5 SENSITIVE Sensitive     Inducible Clindamycin NEGATIVE Sensitive     * RARE METHICILLIN RESISTANT STAPHYLOCOCCUS AUREUS  Aerobic/Anaerobic Culture (surgical/deep wound)     Status: None   Collection Time: 05/20/19 10:51 AM   Specimen: PATH Other; Body Fluid  Result Value Ref Range Status   Specimen Description   Final    FLUID Performed at [facility], [address] [place], [place] [address]    Special Requests   Final    RIGHT [facility] Performed at [facility], [address] [place], [place] [address]     Gram Stain   Final    ABUNDANT WBC PRESENT, PREDOMINANTLY PMN RARE GRAM POSITIVE COCCI    Culture   Final    RARE METHICILLIN RESISTANT STAPHYLOCOCCUS AUREUS NO ANAEROBES ISOLATED Performed at [facility], [place] [address], [place],  [address]    Report Status 05/05/2019 FINAL  Final   Organism ID, Bacteria METHICILLIN RESISTANT STAPHYLOCOCCUS AUREUS  Final      Susceptibility   Methicillin resistant staphylococcus aureus - MIC*    CIPROFLOXACIN >=8 RESISTANT Resistant     ERYTHROMYCIN >=8 RESISTANT Resistant     GENTAMICIN <=0.5 SENSITIVE Sensitive     OXACILLIN >=4 RESISTANT Resistant     TETRACYCLINE <=1 SENSITIVE Sensitive     VANCOMYCIN 1 SENSITIVE Sensitive     TRIMETH/SULFA <=10 SENSITIVE Sensitive     CLINDAMYCIN <=0.25 SENSITIVE Sensitive     RIFAMPIN <=0.5 SENSITIVE Sensitive     Inducible Clindamycin NEGATIVE Sensitive     * RARE METHICILLIN RESISTANT STAPHYLOCOCCUS AUREUS    Janene Madeira, MSN, NP-C [facility] [place] Cell: [phone] Pager: [phone]  05/27/2019 1:42 PM

## 2019-05-25 NOTE — Anesthesia Postprocedure Evaluation (Signed)
Anesthesia Post Note  Patient: DEVONTAE CASASOLA  Procedure(s) Performed: LEFT VIDEO ASSISTED THORACOSCOPY (VATS)/DECORTICATION; LEFT LOWER LOBE WEDGE RESECTION; DRAINAGE OF EMPYEMA (Left )     Patient location during evaluation: PACU Anesthesia Type: General Level of consciousness: awake and alert Pain management: pain level controlled Vital Signs Assessment: post-procedure vital signs reviewed and stable Respiratory status: spontaneous breathing, nonlabored ventilation, respiratory function stable and patient connected to nasal cannula oxygen Cardiovascular status: blood pressure returned to baseline and stable Postop Assessment: no apparent nausea or vomiting Anesthetic complications: no    Last Vitals:  Vitals:   05/07/2019 1840 05/13/2019 1845  BP: 132/86 127/74  Pulse:  73  Resp:    Temp: 36.5 C   SpO2: 99% 91%    Last Pain:  Vitals:   05/11/2019 1840  TempSrc: Oral  PainSc:                  Tiajuana Amass

## 2019-05-25 NOTE — Transfer of Care (Signed)
Immediate Anesthesia Transfer of Care Note  Patient: Alan Small  Procedure(s) Performed: LEFT VIDEO ASSISTED THORACOSCOPY (VATS)/DECORTICATION; LEFT LOWER LOBE WEDGE RESECTION; DRAINAGE OF EMPYEMA (Left )  Patient Location: PACU  Anesthesia Type:General  Level of Consciousness: awake, alert  and patient cooperative  Airway & Oxygen Therapy: Patient Spontanous Breathing and Patient connected to face mask oxygen  Post-op Assessment: Report given to RN and Post -op Vital signs reviewed and stable  Post vital signs: Reviewed and stable  Last Vitals:  Vitals Value Taken Time  BP 110/66 05/14/2019 1742  Temp    Pulse 83 05/12/2019 1745  Resp 20 05/26/2019 1745  SpO2 91 % 05/30/2019 1745  Vitals shown include unvalidated device data.  Last Pain:  Vitals:   05/09/2019 1117  TempSrc:   PainSc: 10-Worst pain ever         Complications: No apparent anesthesia complications   Denies pain or discomforts. 1 unit PRBC completed.  Tolerated well.

## 2019-05-25 NOTE — Progress Notes (Signed)
Pt received from PACU. VSS. Telemetry applied. CT drsg clean, dry and intact with 250cc drainage. Call light in reach. Will continue to monitor.  Jayvon Canterbury, RN

## 2019-05-25 NOTE — Anesthesia Preprocedure Evaluation (Signed)
Anesthesia Evaluation  Patient identified by MRN, date of birth, ID band Patient awake    Reviewed: Allergy & Precautions, NPO status , Patient's Chart, lab work & pertinent test results  Airway Mallampati: II  TM Distance: >3 FB     Dental  (+) Dental Advisory Given   Pulmonary asthma , pneumonia, COPD, Current Smoker,    breath sounds clear to auscultation       Cardiovascular negative cardio ROS   Rhythm:Regular Rate:Normal     Neuro/Psych Seizures -,  Depression Schizophrenia CVA    GI/Hepatic Neg liver ROS, PUD, GERD  ,  Endo/Other  Hypothyroidism   Renal/GU CRFRenal disease     Musculoskeletal   Abdominal   Peds  Hematology negative hematology ROS (+)   Anesthesia Other Findings   Reproductive/Obstetrics                             Lab Results  Component Value Date   WBC 9.4 05/24/2019   HGB 7.6 (L) 05/08/2019   HCT 25.0 (L) 05/29/2019   MCV 94.7 05/07/2019   PLT 400 05/23/2019   Lab Results  Component Value Date   CREATININE 2.62 (H) 05/29/2019   BUN 50 (H) 05/18/2019   NA 137 05/13/2019   K 5.3 (H) 05/09/2019   CL 111 05/29/2019   CO2 20 (L) 05/30/2019    Anesthesia Physical Anesthesia Plan  ASA: IV  Anesthesia Plan: General   Post-op Pain Management:    Induction: Intravenous  PONV Risk Score and Plan: 1 and Dexamethasone, Ondansetron and Treatment may vary due to age or medical condition  Airway Management Planned: Double Lumen EBT  Additional Equipment: Arterial line  Intra-op Plan:   Post-operative Plan: Extubation in OR and Possible Post-op intubation/ventilation  Informed Consent: I have reviewed the patients History and Physical, chart, labs and discussed the procedure including the risks, benefits and alternatives for the proposed anesthesia with the patient or authorized representative who has indicated his/her understanding and acceptance.      Dental advisory given  Plan Discussed with: CRNA  Anesthesia Plan Comments:         Anesthesia Quick Evaluation

## 2019-05-26 ENCOUNTER — Other Ambulatory Visit: Payer: Self-pay

## 2019-05-26 ENCOUNTER — Inpatient Hospital Stay (HOSPITAL_COMMUNITY): Payer: Medicare Other

## 2019-05-26 DIAGNOSIS — M4656 Other infective spondylopathies, lumbar region: Secondary | ICD-10-CM

## 2019-05-26 DIAGNOSIS — K651 Peritoneal abscess: Secondary | ICD-10-CM

## 2019-05-26 DIAGNOSIS — Z885 Allergy status to narcotic agent status: Secondary | ICD-10-CM

## 2019-05-26 LAB — GLUCOSE, CAPILLARY
Glucose-Capillary: 79 mg/dL (ref 70–99)
Glucose-Capillary: 112 mg/dL — ABNORMAL HIGH (ref 70–99)
Glucose-Capillary: 90 mg/dL (ref 70–99)
Glucose-Capillary: 154 mg/dL — ABNORMAL HIGH (ref 70–99)
Glucose-Capillary: 159 mg/dL — ABNORMAL HIGH (ref 70–99)

## 2019-05-26 LAB — COMPREHENSIVE METABOLIC PANEL
ALT: 26 U/L (ref 0–44)
AST: 29 U/L (ref 15–41)
Albumin: 1.3 g/dL — ABNORMAL LOW (ref 3.5–5.0)
Alkaline Phosphatase: 161 U/L — ABNORMAL HIGH (ref 38–126)
Anion gap: 5 (ref 5–15)
BUN: 41 mg/dL — ABNORMAL HIGH (ref 8–23)
CO2: 19 mmol/L — ABNORMAL LOW (ref 22–32)
Calcium: 8 mg/dL — ABNORMAL LOW (ref 8.9–10.3)
Chloride: 113 mmol/L — ABNORMAL HIGH (ref 98–111)
Creatinine, Ser: 2.71 mg/dL — ABNORMAL HIGH (ref 0.61–1.24)
GFR calc Af Amer: 27 mL/min — ABNORMAL LOW (ref >60)
GFR calc non Af Amer: 24 mL/min — ABNORMAL LOW (ref >60)
Glucose, Bld: 91 mg/dL (ref 70–99)
Potassium: 5.7 mmol/L — ABNORMAL HIGH (ref 3.5–5.1)
Sodium: 137 mmol/L (ref 135–145)
Total Bilirubin: 1 mg/dL (ref 0.3–1.2)
Total Protein: 5.2 g/dL — ABNORMAL LOW (ref 6.5–8.1)

## 2019-05-26 LAB — URINALYSIS, ROUTINE W REFLEX MICROSCOPIC
Bilirubin Urine: NEGATIVE
Glucose, UA: NEGATIVE mg/dL
Ketones, ur: NEGATIVE mg/dL
Nitrite: NEGATIVE
Protein, ur: 30 mg/dL — AB
RBC / HPF: >50 RBC/hpf — ABNORMAL HIGH (ref 0–5)
Specific Gravity, Urine: 1.012 (ref 1.005–1.030)
WBC, UA: >50 WBC/hpf — ABNORMAL HIGH (ref 0–5)
pH: 5 (ref 5.0–8.0)

## 2019-05-26 LAB — CBC
HCT: 26.4 % — ABNORMAL LOW (ref 39.0–52.0)
Hemoglobin: 8.2 g/dL — ABNORMAL LOW (ref 13.0–17.0)
MCH: 29.3 pg (ref 26.0–34.0)
MCHC: 31.1 g/dL (ref 30.0–36.0)
MCV: 94.3 fL (ref 80.0–100.0)
Platelets: 404 10*3/uL — ABNORMAL HIGH (ref 150–400)
RBC: 2.8 MIL/uL — ABNORMAL LOW (ref 4.22–5.81)
RDW: 14.6 % (ref 11.5–15.5)
WBC: 12.7 10*3/uL — ABNORMAL HIGH (ref 4.0–10.5)
nRBC: 0 % (ref 0.0–0.2)

## 2019-05-26 LAB — VITAMIN B1: Vitamin B1 (Thiamine): 129.2 nmol/L (ref 66.5–200.0)

## 2019-05-26 MED ORDER — SODIUM ZIRCONIUM CYCLOSILICATE 5 G PO PACK
5.0000 g | PACK | Freq: Once | ORAL | Status: AC
  Administered 2019-05-26: 14:00:00 5 g via ORAL
  Filled 2019-05-26: qty 1

## 2019-05-26 MED ORDER — SODIUM CHLORIDE 0.9 % IV SOLN
300.0000 mg | Freq: Three times a day (TID) | INTRAVENOUS | Status: DC
  Administered 2019-05-26 – 2019-05-28 (×6): 300 mg via INTRAVENOUS
  Filled 2019-05-26 (×9): qty 300

## 2019-05-26 MED ORDER — POLYETHYLENE GLYCOL 3350 17 G PO PACK: 17.0000 g | PACK | Freq: Every day | ORAL | Status: DC | PRN

## 2019-05-26 NOTE — Progress Notes (Signed)
PHARMACY NOTE:  ANTIMICROBIAL RENAL DOSAGE ADJUSTMENT  Current antimicrobial regimen includes a mismatch between antimicrobial dosage and estimated renal function.  As per policy approved by the Pharmacy & Therapeutics and Medical Executive Committees, the antimicrobial dosage will be adjusted accordingly.  Current antimicrobial dosage:  Ceftaroline 400 mg q  8 hours   Indication: Disseminated MRSA infection   Renal Function:  Estimated Creatinine Clearance: 30.2 mL/min (A) (by C-G formula based on SCr of 2.71 mg/dL (H)). []      On intermittent HD, scheduled: []      On CRRT    Antimicrobial dosage has been changed to:  Ceftaroline 300mg  q 8 hours  Additional comments: Creatinine worsened today so will reduce dose slightly. Follow SCr trends   Thank you for allowing pharmacy to be a part of this patient's care.  Jimmy Footman, PharmD, BCPS, BCIDP Infectious Diseases Clinical Pharmacist Phone: [phone] 05/26/2019 7:30 AM

## 2019-05-26 NOTE — Progress Notes (Signed)
PROGRESS NOTE    Alan Small  LZJ:[id number] DOB: 12/29/1955 DOA: 05/09/2019 PCP: [name], MD   Brief Narrative:   Alan Small is a 63 y.o. male with medical history significant of solitary left kidney, CKD stage IV, left staghorn calculus SP stenting, urethral stricture SP urethroplasty, schizophrenia, clinically diagnosed COPD, depression.  Patient was admitted at [facility] between February 10, 2019 and March 02, 2019. Patient was treated for pyelonephritis and retroperitoneal fluid collection concerning for abscess. Urine culture grew MDR E. coli. Patient underwent aspiration of the abscess on 9/17 with JP drain placement. Patient was started on IV imipenem with a PICC line. Summary ofhisactive problems in the hospital is as following.Underwent left percutaneous nephrolithotomy 03/28/2019 and ureteral stent placement at the same time. Underwent laser lithotripsy and basket extraction on 04/05/2019. Patient was recommended to continue the antibiotics until April 16, 2019.  Patient presented to [facility] on 05/11/2019 with shortness of breath left leg pain and fever and nausea and vomiting. CT scan showed evidence of stone on the left side. Patient was initially placed on the BiPAP. His oxygenation worsened and patient actually required intubation in the emergency department. Urology was consulted and patient underwent urgent cystoscopy and left ureteral stent placement. Patient was started on broad-spectrum antibiotics and his blood cultures came back positive for MRSA. Patient was extubated on 05/20/2019 and since then has remained on nasal cannula. Patient's PICC line was removed on 05/12/2019 and a central line was placed which was removed on 05/18/2019. Foley catheter was inserted on 05/11/2019 after the urology procedure and removed on 05/18/2019. TEE showed no evidence of endocarditis but the patient does have evidence of septic emboli with cavitary lesions on  bilateral lung parenchyma therefore patient is treated as infective endocarditis. 05/19/2019 left thoracentesis/pleural fluid culture came back positive for MRSA. Report showed loculated effusion post procedure. 05/20/2019 had pyomyositis underwent right-sided drain placement after incision and debridement. -Due to patient's complaint of back pain MRI thoracic and lumbar spine were performed on 05/21/2019 which showed evidence of epidural abscess as well as facet joint infection. [facility] neurosurgery was consulted who recommended conservative management. Dr. [name] was on call. -Due to worsening shortness of breath CT chest performed on 05/22/2019 showed evidence of recollection of pleural fluid. Chest tube was inserted by PCCM on 05/22/2019. -VATS 12/23 - tolerated well; on Ceftaroline now. Clinically improving.   Assessment & Plan:   Principal Problem:   Empyema ([place]) Active Problems:   Schizophrenia ([place])   Hypothyroidism   Severe sepsis (HCC)   Pyohydronephrosis   Kidney stone   Chronic kidney disease, stage IV (severe) (HCC)   COPD (chronic obstructive pulmonary disease) (HCC)   Atrial flutter (HCC)   MRSA bacteremia   Septic embolism (HCC)   Epidural abscess   ETOH abuse   Severe sepsiswith septic shock, resolved, POA MRSA bacteremia with disseminated infection: likely from previous central line, POA Acute hypoxic respiratory failure due to COPD exacerbation in the setting of Severe sepsis -POA Pyomyositis SP right-sided flank drain placement POA Empyema SP thoracentesis/VATS POA. And now chest tube placement Epidural abscess as well as L4-L5 infection SpO2: 96 % O2 Flow Rate (L/min): 3 L/min - Blood cultures came back positive for MRSA; Urine culture came back positive for VRE. 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 - TCTS following - VATS with  decortication on 05/19/2019 w/ 2 chest  tubes - tolerating well; pain controlled; continue Flutter/IS - Urology was initially consulted as well which currently has signed off as below. - General surgery is currently following up on the patient. - Neurosurgery at [facility] with Dr. [name] aware who recommends conservative management with IV antibiotics as long as the patient does not have any symptoms (remains AOx4) - Infectious disease following, transition to ceftaroline from Teflaro/Zyvox 12/23; So far repeat blood cultures have been negative.  Obstructing/infected renal calculus, Concurrent VRE UTI, likely POA SP emergency ureteral stent placement - Cystoscopy on 05/11/2019 and also was found to have a bulbar urethral stricture.  - ID following, patient transitioned to ceftaroline - The stricture was dilated.  - The stent was placed in the left ureter.  - The urine culture on 05/11/2019 was negative.  - A repeat urine culture was sent on 05/16/2019 and that is positive for VRE - Urology no longer following - call back if needed  A. fib/flutter with rapid ventricular rate, resolved - Currently in sinus tach with few PVCs/PACs - Previously on amiodaron drip, for now on lopressor with adequate control - Anticoagulation not recommended due to history of fall - Echo with normal LV function  Alcohol withdrawal syndrome, resolved - Monitor for withdrawal - less likely now given timeframe and patient's prolonged hospital stay  Acute on chronic renal failure, improving Solitary kidney Lab Results  Component Value Date   CREATININE 2.71 (H) 05/26/2019   CREATININE 2.62 (H) 05/14/2019   CREATININE 2.51 (H) 05/13/2019  - Unknown baseline - Peak creatinine at this stay 3.2 (limited chart review baseline around 2.5 with peak of 5 01/22/19)  Paranoid schizophrenia - Resume home medications as soon as feasible  - Patient is a guardian of the state - likely for this reason although have not verified  with family/friends  Hypernatremia/hyperkalemia - Replete and recheck - Lokelma x1 24th - K 5.7  DVT prophylaxis: Heparin Code Status: Full Family Communication: None present Disposition Plan: Inpatient, pending further clinical evaluation and improvement.  Consultants:   TCTS  Infectious disease  Neurology  General surgery  Subjective: No acute issues or events overnight, patient feels quite well denies chest pain, other than chest tube insertion site, headache, fevers, chills.  Objective: Vitals:   05/15/2019 1809 05/15/2019 1840 05/08/2019 1845 05/06/2019 2100  BP: 109/71 132/86 127/74 119/88  Pulse: (!) 109  73   Resp: 19   20  Temp:  97.7 F (36.5 C)  98 F (36.7 C)  TempSrc:  Oral  Oral  SpO2: 94% 99% 91% 96%  Weight:      Height:        Intake/Output Summary (Last 24 hours) at 05/26/2019 0814 Last data filed at 05/26/2019 1607 Gross per 24 hour  Intake 2215 ml  Output 1710 ml  Net 505 ml   Filed Weights   05/14/2019 2200  Weight: 90.1 kg    Examination:  General:  Pleasantly resting in bed, No acute distress. HEENT:  Normocephalic atraumatic.  Sclerae nonicteric, noninjected.  Extraocular movements intact bilaterally. Neck:  Without mass or deformity.  Trachea is midline. Lungs: Diminished breath sounds left greater than right with notable rales bilaterally, left chest tube x2 to suction. Heart:  Regular rate and rhythm.  Without murmurs, rubs, or gallops. Abdomen: Soft minimally tender to deep palpation, noted JP drain in ureteral drain without erythema or purulence Extremities: Without cyanosis, clubbing, edema, or obvious deformity. Vascular:  Dorsalis pedis and posterior tibial pulses palpable bilaterally. Skin:  Warm and dry, no erythema, no ulcerations.  Data Reviewed: I have personally reviewed following labs and imaging studies  CBC: Recent Labs  Lab 05/22/19 0545 05/23/19 0418 05/03/2019 0735 06/01/2019 0243 05/14/2019 1709 05/26/19 0240    WBC 14.8* 13.2* 10.5 9.4  --  12.7*  HGB 8.9* 8.0* 8.0* 7.6* 6.8* 8.2*  HCT 28.0* 26.5* 25.5* 25.0* 20.0* 26.4*  MCV 88.9 93.3 89.8 94.7  --  94.3  PLT 365 378 410* 400  --  315*   Basic Metabolic Panel: Recent Labs  Lab 05/20/19 0741 05/21/19 0508 05/22/19 0545 05/22/19 1639 05/23/19 0418 05/08/2019 0735 05/11/2019 0243 05/13/2019 1709 05/26/19 0240  NA 139   139 139 140 141 139 138 137 140 137  K 3.8   3.8 4.4 5.3* 5.3* 5.1 5.2* 5.3* 5.5* 5.7*  CL 111   111 110 111 111 111 111 111  --  113*  CO2 19*   19* 20* 22 24 18* 20* 20*  --  19*  GLUCOSE 93   93 90 124* 98 94 89 103*  --  91  BUN 60*   63* 64* 68* 60* 57* 54* 50*  --  41*  CREATININE 3.07*   3.08* 3.24* 2.98* 2.92* 2.85* 2.51* 2.62*  --  2.71*  CALCIUM 7.7*   7.4* 7.6* 7.8* 8.0* 7.9* 8.1* 8.0*  --  8.0*  MG 2.1  --  2.4  --   --   --   --   --   --   PHOS 5.1*   5.0* 5.7* 4.4  --  5.1* 4.3  --   --   --    GFR: Estimated Creatinine Clearance: 30.2 mL/min (A) (by C-G formula based on SCr of 2.71 mg/dL (H)). Liver Function Tests: Recent Labs  Lab 05/22/19 1639 05/23/19 0418 05/09/2019 0735 05/13/2019 0243 05/26/19 0240  AST 37  --   --  45* 29  ALT 26  --   --  34 26  ALKPHOS 181*  --   --  228* 161*  BILITOT 1.5*  --   --  1.0 1.0  PROT 6.4*  --   --  5.6* 5.2*  ALBUMIN 1.6* 1.5* 1.5* 1.2* 1.3*   No results for input(s): LIPASE, AMYLASE in the last 168 hours. Recent Labs  Lab 05/22/19 1639  AMMONIA 12   Coagulation Profile: Recent Labs  Lab 05/11/2019 1400  INR 1.1   Cardiac Enzymes: No results for input(s): CKTOTAL, CKMB, CKMBINDEX, TROPONINI in the last 168 hours. BNP (last 3 results) No results for input(s): PROBNP in the last 8760 hours. HbA1C: No results for input(s): HGBA1C in the last 72 hours. CBG: Recent Labs  Lab 05/20/19 2128 05/22/19 1433 05/18/2019 1859 05/29/2019 2149 05/26/19 0613  GLUCAP 119* 94 111* 120* 79   Lipid Profile: No results for input(s): CHOL, HDL, LDLCALC, TRIG,  CHOLHDL, LDLDIRECT in the last 72 hours. Thyroid Function Tests: No results for input(s): TSH, T4TOTAL, FREET4, T3FREE, THYROIDAB in the last 72 hours. Anemia Panel: No results for input(s): VITAMINB12, FOLATE, FERRITIN, TIBC, IRON, RETICCTPCT in the last 72 hours. Sepsis Labs: No results for input(s): PROCALCITON, LATICACIDVEN in the last 168 hours.  Recent Results (from the past 240 hour(s))  Body fluid culture     Status: None   Collection Time: 05/19/19 12:02 PM   Specimen: PATH Cytology Pleural fluid  Result Value Ref Range Status   Specimen Description   Final    PLEURAL Performed at [facility]  Lab, [place], [place] [address]    Special Requests   Final    NONE Performed at [facility], [address] [place], [place] [address]    Gram Stain   Final    ABUNDANT WBC PRESENT,BOTH PMN AND MONONUCLEAR NO ORGANISMS SEEN    Culture   Final    RARE METHICILLIN RESISTANT STAPHYLOCOCCUS AUREUS CRITICAL RESULT CALLED TO, READ BACK BY AND VERIFIED WITH: RN  Dewayne Hatch [id number] FCP Performed at [facility], [address] [place], [place] [address]    Report Status 05/22/2019 FINAL  Final   Organism ID, Bacteria METHICILLIN RESISTANT STAPHYLOCOCCUS AUREUS  Final      Susceptibility   Methicillin resistant staphylococcus aureus - MIC*    CIPROFLOXACIN >=8 RESISTANT Resistant     ERYTHROMYCIN >=8 RESISTANT Resistant     GENTAMICIN <=0.5 SENSITIVE Sensitive     OXACILLIN >=4 RESISTANT Resistant     TETRACYCLINE <=1 SENSITIVE Sensitive     VANCOMYCIN 1 SENSITIVE Sensitive     TRIMETH/SULFA <=10 SENSITIVE Sensitive     CLINDAMYCIN <=0.25 SENSITIVE Sensitive     RIFAMPIN <=0.5 SENSITIVE Sensitive     Inducible Clindamycin NEGATIVE Sensitive     * RARE METHICILLIN RESISTANT STAPHYLOCOCCUS AUREUS  Aerobic/Anaerobic Culture (surgical/deep wound)     Status: None   Collection Time: 05/20/19 10:51 AM   Specimen: PATH Other; Body Fluid   Result Value Ref Range Status   Specimen Description   Final    FLUID Performed at [facility], [address], [place], [place] [address]    Special Requests   Final    RIGHT [facility] Performed at [facility], [address] [place], [place] [address]    Gram Stain   Final    ABUNDANT WBC PRESENT, PREDOMINANTLY PMN RARE GRAM POSITIVE COCCI    Culture   Final    RARE METHICILLIN RESISTANT STAPHYLOCOCCUS AUREUS NO ANAEROBES ISOLATED Performed at [facility], [address] [place], [place] [address]    Report Status 05/14/2019 FINAL  Final   Organism ID, Bacteria METHICILLIN RESISTANT STAPHYLOCOCCUS AUREUS  Final      Susceptibility   Methicillin resistant staphylococcus aureus - MIC*    CIPROFLOXACIN >=8 RESISTANT Resistant     ERYTHROMYCIN >=8 RESISTANT Resistant     GENTAMICIN <=0.5 SENSITIVE Sensitive     OXACILLIN >=4 RESISTANT Resistant     TETRACYCLINE <=1 SENSITIVE Sensitive     VANCOMYCIN 1 SENSITIVE Sensitive     TRIMETH/SULFA <=10 SENSITIVE Sensitive     CLINDAMYCIN <=0.25 SENSITIVE Sensitive     RIFAMPIN <=0.5 SENSITIVE Sensitive     Inducible Clindamycin NEGATIVE Sensitive     * RARE METHICILLIN RESISTANT STAPHYLOCOCCUS AUREUS  Aerobic/Anaerobic Culture (surgical/deep wound)     Status: None (Preliminary result)   Collection Time: 05/26/2019  3:41 PM   Specimen: PATH Cytology Pleural fluid; Body Fluid  Result Value Ref Range Status   Specimen Description FLUID LEFT PLEURAL  Final   Special Requests PT ON ANCEF  Final   Gram Stain   Final    NO WBC SEEN NO ORGANISMS SEEN Performed at [facility], [address], [place], [place] [address]    Culture PENDING  Incomplete   Report Status PENDING  Incomplete  Aerobic/Anaerobic Culture (surgical/deep wound)     Status: None (Preliminary result)   Collection Time: 05/28/2019  3:49 PM   Specimen: PATH Cytology Pleural fluid; Body  Fluid  Result Value Ref Range Status   Specimen  Description FLUID PLEURAL LEFT  Final   Special Requests NO 2 PT ON ANCEF  Final   Gram Stain   Final    RARE WBC PRESENT,BOTH PMN AND MONONUCLEAR NO ORGANISMS SEEN Performed at [facility], [address] [place], [place] [address]    Culture PENDING  Incomplete   Report Status PENDING  Incomplete         Radiology Studies: CT CHEST WO CONTRAST  Result Date: 05/18/2019 CLINICAL DATA:  Empyema. History of septic emboli with cavitary lesions. Patient is scheduled for VATS procedure 05/26/2019. EXAM: CT CHEST WITHOUT CONTRAST TECHNIQUE: Multidetector CT imaging of the chest was performed following the standard protocol without IV contrast. COMPARISON:  Chest CT dated 05/22/2019. FINDINGS: Cardiovascular: Heart size appears stable. No thoracic aortic aneurysm. Mild aortic atherosclerosis. Mediastinum/Nodes: Oral contrast within the lower esophagus. Esophagus otherwise unremarkable in appearance. No mass or enlarged lymph nodes appreciated within the mediastinum. Trachea is unremarkable. Lungs/Pleura: LEFT-sided chest tube has been placed in the interval, located within a persistent large pleural effusion on the LEFT which is likely loculated to some degree. There is associated compressive atelectasis w along the course of the LEFT pleural effusion. No RIGHT-sided pleural effusion. Additional atelectasis at the RIGHT lung base. Again noted are scattered pulmonary nodules including a 2 cm LEFT upper lobe nodule (series 4, image 44) and a 1 cm LEFT apical nodule (image 27), posterior RIGHT lower lobe nodules measuring 0.7 cm and 1.3 cm (images 36 and 46 respectively, and a RIGHT lower lobe nodule measuring 1.1 cm (image 62). All of these nodules are similar in size. No new nodules or consolidations identified within either lung. No pneumothorax. Upper Abdomen: Limited images of the upper abdomen are unremarkable. Musculoskeletal: No acute or suspicious osseous finding. IMPRESSION: 1. LEFT-sided  chest tube has been placed in the interval, located within a persistent large LEFT pleural effusion which is likely loculated to some degree. Associated compressive atelectasis along the course of the LEFT pleural effusion. 2. Additional atelectasis at the RIGHT lung base. No RIGHT-sided pleural effusion. 3. Stable bilateral pulmonary nodules, as detailed above, compatible with the previous description of septic emboli. No new nodules or consolidations identified within either lung. Aortic Atherosclerosis (ICD10-I70.0). Electronically Signed   By: Franki Cabot M.D.   On: 05/04/2019 09:36   DG CHEST PORT 1 VIEW  Result Date: 05/26/2019 CLINICAL DATA:  Empyema.  Chest tube. EXAM: PORTABLE CHEST 1 VIEW COMPARISON:  05/03/2019 FINDINGS: Left apical as well as left basilar chest tubes unchanged. Lungs are adequately inflated with persistent hazy opacification over the left lung without significant change. Small to moderate left effusion unchanged. No definite pneumothorax. Right lung is clear. Cardiomediastinal silhouette and remainder of the exam is unchanged. IMPRESSION: Stable opacification over the left lung. Slight improvement left-sided effusion with basilar atelectasis. Two left-sided chest tubes unchanged. Electronically Signed   By: Marin Olp M.D.   On: 05/26/2019 07:51   DG Chest Port 1 View  Result Date: 05/10/2019 CLINICAL DATA:  Postop chest x-ray in PACU EXAM: PORTABLE CHEST 1 VIEW COMPARISON:  CT 05/06/2019, radiograph 05/15/2019 FINDINGS: There is diffusely increased opacity through the left hemithorax with diminished size of the complex left pleural effusion within apical left chest tube and basilar left chest tube in place lung volumes are diminished in the right hemithorax with increased streaky opacity in vascular crowding favoring atelectatic change. Multifocal nodular opacities are again  noted in the left lung. Lesions in the right lung are difficult to visualize. Cardiomediastinal  contours are partially obscured by overlying opacity but appear grossly similar accounting for differences in technique and lung inflation. No acute osseous or soft tissue abnormality. IMPRESSION: 1. Persistent asymmetric opacity through the left hemithorax reflecting distributed pleural fluid but with likely overall decrease in size of the complex left pleural effusion. Apical and basal left chest tubes in place. 2. Multifocal nodular opacities in the left lung are grossly similar in appearance to prior studies. 3. Multifocal nodular opacities in the right lung are difficult to visualize. Electronically Signed   By: Lovena Le M.D.   On: 05/23/2019 19:17    Scheduled Meds:  sodium chloride   Intravenous Once   bisacodyl  10 mg Oral Daily   Chlorhexidine Gluconate Cloth  6 each Topical Daily   feeding supplement (ENSURE ENLIVE)  237 mL Oral TID BM   insulin aspart  0-24 Units Subcutaneous TID AC & HS   ipratropium-albuterol  3 mL Nebulization TID   levothyroxine  50 mcg Oral QAC breakfast   metoprolol tartrate  25 mg Oral BID   multivitamin with minerals  1 tablet Oral Daily   nicotine  21 mg Transdermal Daily   senna-docusate  1 tablet Oral QHS   Continuous Infusions:  sodium chloride     sodium chloride 100 mL/hr at 05/11/2019 1850   ceftaroline (TEFLARO) 400 mg IVPB       LOS: 2 days   Time spent: 54min  Terren Haberle C Taino Maertens, DO [facility]  If 7PM-7AM, please contact night-coverage [email or web address] Password [email or web address] 05/26/2019, 8:14 AM

## 2019-05-26 NOTE — Progress Notes (Addendum)
[facility] for Infectious Disease  Date of Admission:  05/23/2019      Total days of antibiotics 15d  ceftaroline day 2           ASSESSMENT: Alan Small is a 63 y.o. male with disseminated MRSA infection thought to have stemmed from a tunneled PICC line that was not maintained/removed following 8w course of IV antibiotics. His pleural space has been cleaned out. His intramuscular abscess has been drained. He has been cleared from nsgy for medical therapy to treat his L4-5 epidural abscess and septic facet arthritis.  Will need repeat MRI nearing the end of therapy to follow up spine abscess.   He will need another tunneled PICC line (CrCl < 43) to continue with ceftaroline for 6 more weeks. Will check ESR/CRP for monitoring on therapy to help decide if PO antibiotics are needed thereafter given burden of his infection.   CTs with high volume still (>600 cc); JP drain 125cc out described to be non-purulent - surgery following. Getting NS to replace 100cc/h. Will need nutritional optimization to help with third spacing (albumin < 2.0).   Creatinine ~2.5-2.8 - dose adjusted for Ceftaroline.   OPAT as outlined below. Would suspect he will be discharged back to [place] [facility] in [place].   Will sign off - please call with questions or any change in patient condition / surgery plans.    PLAN: 1. Continue ceftaroline  2. Tunneled PICC OK anytime once Aline removed 3. OPAT as outlined below  4. Consider dietician consult for malnutrition   OPAT ORDERS:  Diagnosis: Bacteremia, Empyema (L), R Flank intramuscular abscess/pyomyositis, L4-5 septic arthritis with posterior epidural abscess.   Culture Result: MRSA   Allergies  Allergen Reactions  . Mellaril [Thioridazine] Shortness Of Breath and Swelling  . Buprenorphine Itching  . Morphine And Related Itching  . Navane [Thiothixene] Other (See Comments)    Reaction:  GI upset   . Thorazine  [Chlorpromazine] Other (See Comments)    Reaction:  Dizziness/fainting     Discharge antibiotics: Ceftaroline 400 mg IV Q8h   Duration: 6 weeks   End Date: February 4th   [facility] Care and Maintenance Per Protocol including BioPatch (or equivalent product): __ Please pull PIC at completion of IV antibiotics _x_ Please leave PIC in place until doctor has seen patient or been notified --> will need IR appointment to remove   Labs weekly while on IV antibiotics: _x_ CBC with differential __ BMP __ BMP TWICE WEEKLY** _x_ CMP _x_ CRP _x_ ESR __ Vancomycin trough  Fax weekly labs to [phone]  Clinic Follow Up Appt: ID clinic at [facility] if transportation limits patient to get to [facility] site given SNF in [place] - arrange for FU late January / Early February    Principal Problem:   Empyema ([place]) Active Problems:   Schizophrenia ([place])   Hypothyroidism   Severe sepsis ([place])   Pyohydronephrosis   Kidney stone   Chronic kidney disease, stage IV (severe) ([place])   COPD (chronic obstructive pulmonary disease) ([place])   Atrial flutter ([place])   MRSA bacteremia   Septic embolism ([place])   Epidural abscess   ETOH abuse   . sodium chloride   Intravenous Once  . bisacodyl  10 mg Oral Daily  . Chlorhexidine Gluconate Cloth  6 each Topical Daily  . feeding supplement (ENSURE ENLIVE)  237 mL Oral TID BM  . insulin aspart  0-24 Units  Subcutaneous TID AC & HS  . ipratropium-albuterol  3 mL Nebulization TID  . levothyroxine  50 mcg Oral QAC breakfast  . metoprolol tartrate  25 mg Oral BID  . multivitamin with minerals  1 tablet Oral Daily  . nicotine  21 mg Transdermal Daily  . senna-docusate  1 tablet Oral QHS    SUBJECTIVE: Chest is sore today with trying to take deep breaths. Had pain medication a little while ago and feels like it is under good control.   Afebrile O/N.  S/P VATS 12/23 ([place]) -  WBC 12.7 K   Review of Systems: Review of Systems  Constitutional: Negative for  chills, fever and malaise/fatigue.  Respiratory: Positive for cough and shortness of breath.   Cardiovascular: Positive for chest pain. Negative for palpitations and orthopnea.  Gastrointestinal: Negative for abdominal pain, nausea and vomiting.  Genitourinary: Negative.   Musculoskeletal: Negative for back pain and joint pain.  Skin: Negative for rash.    Allergies  Allergen Reactions  . Mellaril [Thioridazine] Shortness Of Breath and Swelling  . Buprenorphine Itching  . Morphine And Related Itching  . Navane [Thiothixene] Other (See Comments)    Reaction:  GI upset   . Thorazine [Chlorpromazine] Other (See Comments)    Reaction:  Dizziness/fainting     OBJECTIVE: Vitals:   05/06/2019 1840 05/10/2019 1845 05/07/2019 2100 05/26/19 0900  BP: 132/86 127/74 119/88 115/76  Pulse:  73    Resp:   20 (!) 28  Temp: 97.7 F (36.5 C)  98 F (36.7 C) 98.1 F (36.7 C)  TempSrc: Oral  Oral Oral  SpO2: 99% 91% 96% 97%  Weight:      Height:       Body mass index is 26.94 kg/m.  Physical Exam Constitutional:      Appearance: Normal appearance. He is not ill-appearing.     Comments: Resting comfortably in bed   HENT:     Mouth/Throat:     Mouth: Mucous membranes are moist.     Pharynx: Oropharynx is clear.  Eyes:     General: No scleral icterus.    Pupils: Pupils are equal, round, and reactive to light.  Cardiovascular:     Rate and Rhythm: Normal rate and regular rhythm.     Pulses: Normal pulses.     Heart sounds: No murmur.  Pulmonary:     Effort: Pulmonary effort is normal.     Breath sounds: Rhonchi present.     Comments: Rub on left chest with 2 CTs in place. Serosaguinous fluid  Abdominal:     General: Bowel sounds are normal. There is no distension.     Palpations: Abdomen is soft.     Tenderness: There is no abdominal tenderness.  Musculoskeletal:        General: No swelling or tenderness. Normal range of motion.  Skin:    General: Skin is warm and dry.      Capillary Refill: Capillary refill takes less than 2 seconds.  Neurological:     Mental Status: He is alert and oriented to person, place, and time.     Lab Results Lab Results  Component Value Date   WBC 12.7 (H) 05/26/2019   HGB 8.2 (L) 05/26/2019   HCT 26.4 (L) 05/26/2019   MCV 94.3 05/26/2019   PLT 404 (H) 05/26/2019    Lab Results  Component Value Date   CREATININE 2.71 (H) 05/26/2019   BUN 41 (H) 05/26/2019   NA 137 05/26/2019  K 5.7 (H) 05/26/2019   CL 113 (H) 05/26/2019   CO2 19 (L) 05/26/2019    Lab Results  Component Value Date   ALT 26 05/26/2019   AST 29 05/26/2019   ALKPHOS 161 (H) 05/26/2019   BILITOT 1.0 05/26/2019     Microbiology: Recent Results (from the past 240 hour(s))  Body fluid culture     Status: None   Collection Time: 05/19/19 12:02 PM   Specimen: PATH Cytology Pleural fluid  Result Value Ref Range Status   Specimen Description   Final    PLEURAL Performed at [facility], [address] [place], [place] [address]    Special Requests   Final    NONE Performed at [facility], [address] [place], [place] [address]    Gram Stain   Final    ABUNDANT WBC PRESENT,BOTH PMN AND MONONUCLEAR NO ORGANISMS SEEN    Culture   Final    RARE METHICILLIN RESISTANT STAPHYLOCOCCUS AUREUS CRITICAL RESULT CALLED TO, READ BACK BY AND VERIFIED WITH: RN  Dewayne Hatch [id number] FCP Performed at [facility], [address] [place], [place] [address]    Report Status 05/22/2019 FINAL  Final   Organism ID, Bacteria METHICILLIN RESISTANT STAPHYLOCOCCUS AUREUS  Final      Susceptibility   Methicillin resistant staphylococcus aureus - MIC*    CIPROFLOXACIN >=8 RESISTANT Resistant     ERYTHROMYCIN >=8 RESISTANT Resistant     GENTAMICIN <=0.5 SENSITIVE Sensitive     OXACILLIN >=4 RESISTANT Resistant     TETRACYCLINE <=1 SENSITIVE Sensitive     VANCOMYCIN 1 SENSITIVE Sensitive     TRIMETH/SULFA <=10 SENSITIVE Sensitive      CLINDAMYCIN <=0.25 SENSITIVE Sensitive     RIFAMPIN <=0.5 SENSITIVE Sensitive     Inducible Clindamycin NEGATIVE Sensitive     * RARE METHICILLIN RESISTANT STAPHYLOCOCCUS AUREUS  Aerobic/Anaerobic Culture (surgical/deep wound)     Status: None   Collection Time: 05/20/19 10:51 AM   Specimen: PATH Other; Body Fluid  Result Value Ref Range Status   Specimen Description   Final    FLUID Performed at [facility], [address] [place], [place] [address]    Special Requests   Final    RIGHT [facility] Performed at [facility], [address] [place], [place] [address]    Gram Stain   Final    ABUNDANT WBC PRESENT, PREDOMINANTLY PMN RARE GRAM POSITIVE COCCI    Culture   Final    RARE METHICILLIN RESISTANT STAPHYLOCOCCUS AUREUS NO ANAEROBES ISOLATED Performed at [facility], [place] [address] [place], [place] [address]    Report Status 05/27/2019 FINAL  Final   Organism ID, Bacteria METHICILLIN RESISTANT STAPHYLOCOCCUS AUREUS  Final      Susceptibility   Methicillin resistant staphylococcus aureus - MIC*    CIPROFLOXACIN >=8 RESISTANT Resistant     ERYTHROMYCIN >=8 RESISTANT Resistant     GENTAMICIN <=0.5 SENSITIVE Sensitive     OXACILLIN >=4 RESISTANT Resistant     TETRACYCLINE <=1 SENSITIVE Sensitive     VANCOMYCIN 1 SENSITIVE Sensitive     TRIMETH/SULFA <=10 SENSITIVE Sensitive     CLINDAMYCIN <=0.25 SENSITIVE Sensitive     RIFAMPIN <=0.5 SENSITIVE Sensitive     Inducible Clindamycin NEGATIVE Sensitive     * RARE METHICILLIN RESISTANT STAPHYLOCOCCUS AUREUS  Aerobic/Anaerobic Culture (surgical/deep wound)     Status: None (Preliminary result)   Collection Time: 05/12/2019  3:41 PM  Specimen: PATH Cytology Pleural fluid; Body Fluid  Result Value Ref Range Status   Specimen Description FLUID LEFT PLEURAL  Final   Special Requests PT ON ANCEF  Final   Gram Stain NO WBC SEEN NO ORGANISMS SEEN   Final   Culture   Final    NO GROWTH < 12  HOURS Performed at [facility], [address], [place], [place] [address]    Report Status PENDING  Incomplete  Aerobic/Anaerobic Culture (surgical/deep wound)     Status: None (Preliminary result)   Collection Time: 05/07/2019  3:49 PM   Specimen: PATH Cytology Pleural fluid; Body Fluid  Result Value Ref Range Status   Specimen Description FLUID PLEURAL LEFT  Final   Special Requests NO 2 PT ON ANCEF  Final   Gram Stain   Final    RARE WBC PRESENT,BOTH PMN AND MONONUCLEAR NO ORGANISMS SEEN    Culture   Final    NO GROWTH < 12 HOURS Performed at [facility], [address], [place], [place] [address]    Report Status PENDING  Incomplete    Janene Madeira, MSN, NP-C [facility] Infectious Disease [email or web address] Pager: [phone] Office: [phone] [place]: [phone]

## 2019-05-26 NOTE — Progress Notes (Signed)
[facility] Progress Note  1 Day Post-Op  Subjective: CC-  S/p VATS yesterday. No complaints this morning. States that his pain is well controlled.  Denies abdominal pain, nausea, vomiting. Passing flatus, no BM since transfer 12/21. Right flank drain in place with 125cc SS drainage last 24 hours.  Objective: Vital signs in last 24 hours: Temp:  [97 F (36.1 C)-98 F (36.7 C)] 98 F (36.7 C) (12/23 2100) Pulse Rate:  [63-109] 73 (12/23 1845) Resp:  [18-29] 20 (12/23 2100) BP: (109-132)/(66-88) 119/88 (12/23 2100) SpO2:  [91 %-99 %] 96 % (12/23 2100) Arterial Line BP: (113-136)/(37-56) 136/56 (12/23 1809) Last BM Date: 05/18/2019  Intake/Output from previous day: 12/23 0701 - 12/24 0700 In: 2215 [I.V.:1400; Blood:315; IV Piggyback:500] Out: 1735 [Urine:200; Drains:125; Blood:800; Chest Tube:610] Intake/Output this shift: No intake/output data recorded.  PE: Gen:  Alert, NAD, pleasant HEENT: EOM's intact, pupils equal and round Pulm:  Rate and effort normal. Left chest tube in place Abd: Soft, NT/ND, +BS, R flank drain with SS fluid in bulb, incision cdi with staples in place and no erythema or drainage Skin: warm and dry  Lab Results:  Recent Labs    05/04/2019 0243 06/01/2019 1709 05/26/19 0240  WBC 9.4  --  12.7*  HGB 7.6* 6.8* 8.2*  HCT 25.0* 20.0* 26.4*  PLT 400  --  404*   BMET Recent Labs    05/07/2019 0243 05/31/2019 1709 05/26/19 0240  NA 137 140 137  K 5.3* 5.5* 5.7*  CL 111  --  113*  CO2 20*  --  19*  GLUCOSE 103*  --  91  BUN 50*  --  41*  CREATININE 2.62*  --  2.71*  CALCIUM 8.0*  --  8.0*   PT/INR Recent Labs    05/15/2019 1400  LABPROT 14.3  INR 1.1   CMP     Component Value Date/Time   NA 137 05/26/2019 0240   NA 139 09/19/2014 1132   K 5.7 (H) 05/26/2019 0240   K 3.7 09/19/2014 1132   CL 113 (H) 05/26/2019 0240   CL 110 09/19/2014 1132   CO2 19 (L) 05/26/2019 0240   CO2 25 09/19/2014 1132   GLUCOSE 91 05/26/2019 0240    GLUCOSE 72 09/19/2014 1132   BUN 41 (H) 05/26/2019 0240   BUN 39 (H) 09/19/2014 1132   CREATININE 2.71 (H) 05/26/2019 0240   CREATININE 2.30 (H) 09/19/2014 1132   CALCIUM 8.0 (L) 05/26/2019 0240   CALCIUM 8.9 09/19/2014 1132   PROT 5.2 (L) 05/26/2019 0240   PROT 6.8 09/19/2014 1132   ALBUMIN 1.3 (L) 05/26/2019 0240   ALBUMIN 3.5 09/19/2014 1132   AST 29 05/26/2019 0240   AST 24 09/19/2014 1132   ALT 26 05/26/2019 0240   ALT 15 (L) 09/19/2014 1132   ALKPHOS 161 (H) 05/26/2019 0240   ALKPHOS 108 09/19/2014 1132   BILITOT 1.0 05/26/2019 0240   BILITOT 0.4 09/19/2014 1132   GFRNONAA 24 (L) 05/26/2019 0240   GFRNONAA 30 (L) 09/19/2014 1132   GFRAA 27 (L) 05/26/2019 0240   GFRAA 34 (L) 09/19/2014 1132   Lipase     Component Value Date/Time   LIPASE 16 02/02/2016 1733   LIPASE 111 05/05/2012 1359       Studies/Results: CT CHEST WO CONTRAST  Result Date: 05/05/2019 CLINICAL DATA:  Empyema. History of septic emboli with cavitary lesions. Patient is scheduled for VATS procedure 05/26/2019. EXAM: CT CHEST WITHOUT CONTRAST TECHNIQUE: Multidetector CT imaging of  the chest was performed following the standard protocol without IV contrast. COMPARISON:  Chest CT dated 05/22/2019. FINDINGS: Cardiovascular: Heart size appears stable. No thoracic aortic aneurysm. Mild aortic atherosclerosis. Mediastinum/Nodes: Oral contrast within the lower esophagus. Esophagus otherwise unremarkable in appearance. No mass or enlarged lymph nodes appreciated within the mediastinum. Trachea is unremarkable. Lungs/Pleura: LEFT-sided chest tube has been placed in the interval, located within a persistent large pleural effusion on the LEFT which is likely loculated to some degree. There is associated compressive atelectasis w along the course of the LEFT pleural effusion. No RIGHT-sided pleural effusion. Additional atelectasis at the RIGHT lung base. Again noted are scattered pulmonary nodules including a 2 cm LEFT  upper lobe nodule (series 4, image 44) and a 1 cm LEFT apical nodule (image 27), posterior RIGHT lower lobe nodules measuring 0.7 cm and 1.3 cm (images 36 and 46 respectively, and a RIGHT lower lobe nodule measuring 1.1 cm (image 62). All of these nodules are similar in size. No new nodules or consolidations identified within either lung. No pneumothorax. Upper Abdomen: Limited images of the upper abdomen are unremarkable. Musculoskeletal: No acute or suspicious osseous finding. IMPRESSION: 1. LEFT-sided chest tube has been placed in the interval, located within a persistent large LEFT pleural effusion which is likely loculated to some degree. Associated compressive atelectasis along the course of the LEFT pleural effusion. 2. Additional atelectasis at the RIGHT lung base. No RIGHT-sided pleural effusion. 3. Stable bilateral pulmonary nodules, as detailed above, compatible with the previous description of septic emboli. No new nodules or consolidations identified within either lung. Aortic Atherosclerosis (ICD10-I70.0). Electronically Signed   By: Franki Cabot M.D.   On: 05/19/2019 09:36   DG CHEST PORT 1 VIEW  Result Date: 05/26/2019 CLINICAL DATA:  Empyema.  Chest tube. EXAM: PORTABLE CHEST 1 VIEW COMPARISON:  05/11/2019 FINDINGS: Left apical as well as left basilar chest tubes unchanged. Lungs are adequately inflated with persistent hazy opacification over the left lung without significant change. Small to moderate left effusion unchanged. No definite pneumothorax. Right lung is clear. Cardiomediastinal silhouette and remainder of the exam is unchanged. IMPRESSION: Stable opacification over the left lung. Slight improvement left-sided effusion with basilar atelectasis. Two left-sided chest tubes unchanged. Electronically Signed   By: Marin Olp M.D.   On: 05/26/2019 07:51   DG Chest Port 1 View  Result Date: 05/12/2019 CLINICAL DATA:  Postop chest x-ray in PACU EXAM: PORTABLE CHEST 1 VIEW  COMPARISON:  CT 05/08/2019, radiograph 05/09/2019 FINDINGS: There is diffusely increased opacity through the left hemithorax with diminished size of the complex left pleural effusion within apical left chest tube and basilar left chest tube in place lung volumes are diminished in the right hemithorax with increased streaky opacity in vascular crowding favoring atelectatic change. Multifocal nodular opacities are again noted in the left lung. Lesions in the right lung are difficult to visualize. Cardiomediastinal contours are partially obscured by overlying opacity but appear grossly similar accounting for differences in technique and lung inflation. No acute osseous or soft tissue abnormality. IMPRESSION: 1. Persistent asymmetric opacity through the left hemithorax reflecting distributed pleural fluid but with likely overall decrease in size of the complex left pleural effusion. Apical and basal left chest tubes in place. 2. Multifocal nodular opacities in the left lung are grossly similar in appearance to prior studies. 3. Multifocal nodular opacities in the right lung are difficult to visualize. Electronically Signed   By: Lovena Le M.D.   On: 06/01/2019 19:17  Anti-infectives: Anti-infectives (From admission, onward)   Start     Dose/Rate Route Frequency Ordered Stop   05/26/19 1400  ceftaroline (TEFLARO) 300 mg in sodium chloride 0.9 % 250 mL IVPB     300 mg 125 mL/hr over 120 Minutes Intravenous Every 8 hours 05/26/19 0731     05/29/2019 2200  ceftaroline (TEFLARO) 400 mg in sodium chloride 0.9 % 250 mL IVPB  Status:  Discontinued     400 mg 250 mL/hr over 60 Minutes Intravenous Every 8 hours 06/02/2019 1050 05/26/19 0731   05/27/2019 1342  ceFAZolin (ANCEF) IVPB 2g/100 mL premix     2 g 200 mL/hr over 30 Minutes Intravenous 30 min pre-op 05/14/2019 1342 05/14/2019 1454   05/30/2019 2245  ceftaroline (TEFLARO) 600 mg in sodium chloride 0.9 % 250 mL IVPB  Status:  Discontinued     600 mg 250 mL/hr over  60 Minutes Intravenous Every 12 hours 05/17/2019 2231 05/14/2019 2239   05/04/2019 2245  linezolid (ZYVOX) IVPB 600 mg  Status:  Discontinued     600 mg 300 mL/hr over 60 Minutes Intravenous Every 12 hours 05/20/2019 2231 05/07/2019 1050   05/03/2019 2245  ceftaroline (TEFLARO) 400 mg in sodium chloride 0.9 % 250 mL IVPB  Status:  Discontinued     400 mg 250 mL/hr over 60 Minutes Intravenous Every 12 hours 05/11/2019 2239 05/25/19 1050       Assessment/Plan Pyohydronephrosis due to obstructing calculous A. Fib/flutter EtOH abuse AKI on CKD stage IV Hypothyroidism Paranoid Schizophrenia  Disseminated MRSA Bacteremia - abx per primary  Empyema - s/p left VATS, LLL wedge resection, drainage empyema 12/23 Dr. Orvan Seen. This was the reason for his transfer to [facility] Epidural abscess - per primary, previously discussed with [facility] who recommended conservative management  RLQ myositis with abscess - s/p incision and drainage 12/18 Dr. Christian Mate at [facility] - cx with MRSA, resistant to cipro/oxacillin/erythromycin - drain with 125cc of SS appearing drainage - follow drain output, can remove once output is low and if it remains non-purulent.  - recommend staple removal 06/02/18  FEN: HH diet VTE: SCDs, per primary/ ok for chemical DVT prophylaxis from surgical standpoint ID: currently on ceftaroline Follow up: surgeon at [facility] after discharge   LOS: 2 days    Wellington [place], [facility] 05/26/2019, 8:28 AM Please see Amion for pager number during day hours 7:00am-4:30pm

## 2019-05-26 NOTE — Op Note (Signed)
Procedure(s): LEFT VIDEO ASSISTED THORACOSCOPY (VATS)/DECORTICATION; LEFT LOWER LOBE WEDGE RESECTION; DRAINAGE OF EMPYEMA Procedure Note  Alan Small male 63 y.o. 05/26/2019  Procedure(s) and Anesthesia Type:    * LEFT VIDEO ASSISTED THORACOSCOPY (VATS)/DECORTICATION; LEFT LOWER LOBE WEDGE RESECTION; DRAINAGE OF EMPYEMA - General  Surgeon(s) and Role:    * Wonda Olds, MD - Primary   Indications: The patient was admitted to the hospital with a brief history of left chest pain and shortness of breath. He has evidence for diffuse infectious process including right flank myositis. Evaluation endocarditis is negative. He is taken to the OR for thoracoscopic chest exploration and pulmonary decortication with a diagnosis of empyema.      Surgeon: Wonda Olds   Assistants: Macarthur Critchley PA-C  Anesthesia: General endotracheal - Double lumen tube  ASA Class: 4    Procedure Detail  LEFT VIDEO ASSISTED THORACOSCOPY (VATS)/DECORTICATION; LEFT LOWER LOBE WEDGE RESECTION; DRAINAGE OF EMPYEMA After consent was obtained from his legal guardian, he was taken to the OR and placed in the supine position. Anesthesia was begun in a smooth fashion by general endotracheal technique with lung isolation. Then he was placed in the right lateral decubitus position (left side up). The left chest was cleansed and draped sterilely with betadine. A surgical pause was performed to identify the correct procedure and patient.  An incision was made in the left 6th ICS in the mid axillary line. The chest was entered safely. This was used as a working port incision. A camera port incision was made in the 8th ICS in the anterior axillary line. The camera was inserted and extensive debris in the left chest was encountered, especially at the costophrenic angle. The was cleared mechanically, and pulmonary decortication was performed of the LLL. A small wedge resection of the LLL was performed due to necrotic  nature of the lung in this area. Two large chest tubes were placed. Multilevel rib block was undertaken with Exparel. The left lung was reinflated under thoracoscopic visualization.  Findings: Extensive empyema  Estimated Blood Loss:  300 mL         Drains: as above         Total IV Fluids: per anes  Blood Given: 250 CC PRBC          Specimens: pleural fluid for microbiology         Implants: none        Complications:  * No complications entered in OR log *         Disposition: PACU - hemodynamically stable.         Condition: stable

## 2019-05-27 ENCOUNTER — Inpatient Hospital Stay (HOSPITAL_COMMUNITY): Payer: Medicare Other

## 2019-05-27 DIAGNOSIS — F205 Residual schizophrenia: Secondary | ICD-10-CM

## 2019-05-27 LAB — GLUCOSE, CAPILLARY
Glucose-Capillary: 121 mg/dL — ABNORMAL HIGH (ref 70–99)
Glucose-Capillary: 91 mg/dL (ref 70–99)
Glucose-Capillary: 117 mg/dL — ABNORMAL HIGH (ref 70–99)
Glucose-Capillary: 98 mg/dL (ref 70–99)
Glucose-Capillary: 132 mg/dL — ABNORMAL HIGH (ref 70–99)

## 2019-05-27 LAB — CBC
HCT: 23.4 % — ABNORMAL LOW (ref 39.0–52.0)
Hemoglobin: 7.3 g/dL — ABNORMAL LOW (ref 13.0–17.0)
MCH: 29.4 pg (ref 26.0–34.0)
MCHC: 31.2 g/dL (ref 30.0–36.0)
MCV: 94.4 fL (ref 80.0–100.0)
Platelets: 361 10*3/uL (ref 150–400)
RBC: 2.48 MIL/uL — ABNORMAL LOW (ref 4.22–5.81)
RDW: 14.7 % (ref 11.5–15.5)
WBC: 13.8 10*3/uL — ABNORMAL HIGH (ref 4.0–10.5)
nRBC: 0 % (ref 0.0–0.2)

## 2019-05-27 LAB — COMPREHENSIVE METABOLIC PANEL
ALT: 25 U/L (ref 0–44)
AST: 35 U/L (ref 15–41)
Albumin: 1.2 g/dL — ABNORMAL LOW (ref 3.5–5.0)
Alkaline Phosphatase: 170 U/L — ABNORMAL HIGH (ref 38–126)
Anion gap: 6 (ref 5–15)
BUN: 43 mg/dL — ABNORMAL HIGH (ref 8–23)
CO2: 18 mmol/L — ABNORMAL LOW (ref 22–32)
Calcium: 8 mg/dL — ABNORMAL LOW (ref 8.9–10.3)
Chloride: 113 mmol/L — ABNORMAL HIGH (ref 98–111)
Creatinine, Ser: 2.54 mg/dL — ABNORMAL HIGH (ref 0.61–1.24)
GFR calc Af Amer: 30 mL/min — ABNORMAL LOW (ref >60)
GFR calc non Af Amer: 26 mL/min — ABNORMAL LOW (ref >60)
Glucose, Bld: 95 mg/dL (ref 70–99)
Potassium: 5.6 mmol/L — ABNORMAL HIGH (ref 3.5–5.1)
Sodium: 137 mmol/L (ref 135–145)
Total Bilirubin: 0.6 mg/dL (ref 0.3–1.2)
Total Protein: 5.2 g/dL — ABNORMAL LOW (ref 6.5–8.1)

## 2019-05-27 MED ORDER — SODIUM ZIRCONIUM CYCLOSILICATE 10 G PO PACK
10.0000 g | PACK | Freq: Two times a day (BID) | ORAL | Status: AC
  Administered 2019-05-27 (×2): 10 g via ORAL
  Filled 2019-05-27 (×2): qty 1

## 2019-05-27 MED ORDER — IPRATROPIUM-ALBUTEROL 0.5-2.5 (3) MG/3ML IN SOLN
3.0000 mL | Freq: Two times a day (BID) | RESPIRATORY_TRACT | Status: DC
  Administered 2019-05-27 – 2019-06-07 (×21): 3 mL via RESPIRATORY_TRACT
  Filled 2019-05-27 (×23): qty 3

## 2019-05-27 NOTE — Progress Notes (Signed)
Patient noted to be mouth breathing upon assessment and appears SOB. Since caring for the patient for the past two days patient has been mouth breathing. O2 sats 97% on 3L nasal cannula.  Diminished breath sounds noted across lower left lung lobe. Respiratory therapist Abigail Butts notified to further assess patient.

## 2019-05-27 NOTE — Progress Notes (Signed)
PROGRESS NOTE    Alan Small  ZOX:[id number] DOB: September 12, 1955 DOA: 05/18/2019 PCP: [name], MD   Brief Narrative:   Alan Small is a 63 y.o. male with medical history significant of solitary left kidney, CKD stage IV, left staghorn calculus SP stenting, urethral stricture SP urethroplasty, schizophrenia, clinically diagnosed COPD, depression.  Patient was admitted at [facility] between February 10, 2019 and March 02, 2019. Patient was treated for pyelonephritis and retroperitoneal fluid collection concerning for abscess. Urine culture grew MDR E. coli. Patient underwent aspiration of the abscess on 9/17 with JP drain placement. Patient was started on IV imipenem with a PICC line. Summary ofhisactive problems in the hospital is as following.Underwent left percutaneous nephrolithotomy 03/28/2019 and ureteral stent placement at the same time. Underwent laser lithotripsy and basket extraction on 04/05/2019. Patient was recommended to continue the antibiotics until April 16, 2019.  Patient presented to [facility] on 05/11/2019 with shortness of breath left leg pain and fever and nausea and vomiting. CT scan showed evidence of stone on the left side. Patient was initially placed on the BiPAP. His oxygenation worsened and patient actually required intubation in the emergency department. Urology was consulted and patient underwent urgent cystoscopy and left ureteral stent placement. Patient was started on broad-spectrum antibiotics and his blood cultures came back positive for MRSA. Patient was extubated on 05/20/2019 and since then has remained on nasal cannula. Patient's PICC line was removed on 05/12/2019 and a central line was placed which was removed on 05/18/2019. Foley catheter was inserted on 05/11/2019 after the urology procedure and removed on 05/18/2019. TEE showed no evidence of endocarditis but the patient does have evidence of septic emboli with cavitary lesions on  bilateral lung parenchyma therefore patient is treated as infective endocarditis. 05/19/2019 left thoracentesis/pleural fluid culture came back positive for MRSA. Report showed loculated effusion post procedure. 05/20/2019 had pyomyositis underwent right-sided drain placement after incision and debridement. -Due to patient's complaint of back pain MRI thoracic and lumbar spine were performed on 05/21/2019 which showed evidence of epidural abscess as well as facet joint infection. [facility] neurosurgery was consulted who recommended conservative management. Dr. [name] was on call. -Due to worsening shortness of breath CT chest performed on 05/22/2019 showed evidence of recollection of pleural fluid. Chest tube was inserted by PCCM on 05/22/2019. -VATS 12/23 - tolerated well; on Ceftaroline now. Clinically improving.  Subjective: No acute issues or events overnight, patient feels quite well denies chest pain, other than chest tube insertion site, headache, fevers, chills.  Assessment & Plan:   Principal Problem:   Empyema of pleural space (HCC) Active Problems:   Schizophrenia ([place])   Hypothyroidism   Severe sepsis (HCC)   Pyohydronephrosis   Kidney stone   Chronic kidney disease, stage IV (severe) (HCC)   COPD (chronic obstructive pulmonary disease) (HCC)   Atrial flutter (HCC)   MRSA bacteremia   Septic embolism (HCC)   Epidural abscess   ETOH abuse   Severe sepsiswith septic shock, resolved, POA MRSA bacteremia with disseminated infection: likely from previous central line, POA Acute hypoxic respiratory failure due to COPD exacerbation in the setting of Severe sepsis -POA Pyomyositis SP right-sided flank drain placement POA Empyema SP thoracentesis/VATS POA. And now chest tube placement Epidural abscess as well as L4-L5 infection SpO2: 97 % O2 Flow Rate (L/min): 3 L/min - Blood cultures came back positive for MRSA; Urine culture came back positive for VRE. -TEE negative for  vegetation, but CT chest was positive for evidence  of septic emboli therefore patient is being treated as a possible right-sided infective endocarditis. - TCTS following - VATS with decortication on 05/30/2019 w/ 2 chest tubes - tolerating well; pain controlled; continue Flutter/IS - Urology was initially consulted as well which currently has signed off as below. - General surgery is currently following up on the patient. - Neurosurgery at [facility] with Dr. [name] aware who recommends conservative management with IV antibiotics as long as the patient does not have any symptoms (remains AOx4) -Infectious disease following, patient currently transitioned to ceftaroline, to finish total of 6 weeks,  So far repeat blood cultures have been negative.  Obstructing/infected renal calculus, Concurrent VRE UTI, likely POA SP emergency ureteral stent placement - Cystoscopy on 05/11/2019 and also was found to have a bulbar urethral stricture.  - ID following, patient transitioned to ceftaroline - The stricture was dilated.  - The stent was placed in the left ureter.  - The urine culture on 05/11/2019 was negative.  - A repeat urine culture was sent on 05/16/2019 and that is positive for VRE - Urology no longer following - call back if needed  A. fib/flutter with rapid ventricular rate, resolved - Currently in sinus tach with few PVCs/PACs - Previously on amiodaron drip, for now on lopressor with adequate control - Anticoagulation not recommended due to history of fall - Echo with normal LV function  Alcohol withdrawal syndrome, resolved - Monitor for withdrawal - less likely now given timeframe and patient's prolonged hospital stay  Acute on chronic renal failure, improving Solitary kidney Lab Results  Component Value Date   CREATININE 2.54 (H) 05/27/2019   CREATININE 2.71 (H) 05/26/2019   CREATININE 2.62 (H) 05/22/2019  - Unknown baseline - Peak creatinine at this stay 3.2 (limited chart review  baseline around 2.5 with peak of 5 01/22/19)  Paranoid schizophrenia - Resume home medications as soon as feasible  - Patient is a guardian of the state - likely for this reason although have not verified with family/friends  Hypernatremia/hyperkalemia - Replete and recheck - Lokelma x1 24th - K 5.7  Protein calorie malnutrition -Will consult nutritionist           DVT prophylaxis: Heparin Code Status: Full Family Communication: None at bedside Disposition Plan: Inpatient, pending further clinical evaluation and improvement.  Consultants:   TCTS  Infectious disease  Neurology  General surgery    Objective: Vitals:   05/27/19 0930 05/27/19 0938 05/27/19 1000 05/27/19 1200  BP: 135/72 135/72 (!) 130/92 124/73  Pulse: 93 81 87 95  Resp: (!) 23  20 20   Temp:    97.8 F (36.6 C)  TempSrc:    Oral  SpO2: 91%  95% 97%  Weight:      Height:        Intake/Output Summary (Last 24 hours) at 05/27/2019 1415 Last data filed at 05/27/2019 1300 Gross per 24 hour  Intake 1727.17 ml  Output 2267 ml  Net -539.83 ml   Filed Weights   05/17/2019 2200  Weight: 90.1 kg    Examination:  Awake Alert, Oriented X 3, No new F.N deficits, Normal affect Symmetrical Chest wall movement, diminished air entry bilaterally, left chest tube x2 RRR,No Gallops,Rubs or new Murmurs, No Parasternal Heave +ve B.Sounds, Abd Soft, No tenderness, No rebound - guarding or rigidity. No Cyanosis, Clubbing or edema, No new Rash or bruise    Data Reviewed: I have personally reviewed following labs and imaging studies  CBC: Recent Labs  Lab 05/23/19 0418  05/15/2019 0735 05/04/2019 0243 05/18/2019 1709 05/26/19 0240 05/27/19 0325  WBC 13.2* 10.5 9.4  --  12.7* 13.8*  HGB 8.0* 8.0* 7.6* 6.8* 8.2* 7.3*  HCT 26.5* 25.5* 25.0* 20.0* 26.4* 23.4*  MCV 93.3 89.8 94.7  --  94.3 94.4  PLT 378 410* 400  --  404* 532   Basic Metabolic Panel: Recent Labs  Lab 05/21/19 0508 05/22/19 0545  05/23/19 0418 05/20/2019 0735 05/20/2019 0243 06/01/2019 1709 05/26/19 0240 05/27/19 0325  NA 139 140 139 138 137 140 137 137  K 4.4 5.3* 5.1 5.2* 5.3* 5.5* 5.7* 5.6*  CL 110 111 111 111 111  --  113* 113*  CO2 20* 22 18* 20* 20*  --  19* 18*  GLUCOSE 90 124* 94 89 103*  --  91 95  BUN 64* 68* 57* 54* 50*  --  41* 43*  CREATININE 3.24* 2.98* 2.85* 2.51* 2.62*  --  2.71* 2.54*  CALCIUM 7.6* 7.8* 7.9* 8.1* 8.0*  --  8.0* 8.0*  MG  --  2.4  --   --   --   --   --   --   PHOS 5.7* 4.4 5.1* 4.3  --   --   --   --    GFR: Estimated Creatinine Clearance: 32.2 mL/min (A) (by C-G formula based on SCr of 2.54 mg/dL (H)). Liver Function Tests: Recent Labs  Lab 05/22/19 1639 05/23/19 0418 05/22/2019 0735 05/10/2019 0243 05/26/19 0240 05/27/19 0325  AST 37  --   --  45* 29 35  ALT 26  --   --  34 26 25  ALKPHOS 181*  --   --  228* 161* 170*  BILITOT 1.5*  --   --  1.0 1.0 0.6  PROT 6.4*  --   --  5.6* 5.2* 5.2*  ALBUMIN 1.6* 1.5* 1.5* 1.2* 1.3* 1.2*   No results for input(s): LIPASE, AMYLASE in the last 168 hours. Recent Labs  Lab 05/22/19 1639  AMMONIA 12   Coagulation Profile: Recent Labs  Lab 05/11/2019 1400  INR 1.1   Cardiac Enzymes: No results for input(s): CKTOTAL, CKMB, CKMBINDEX, TROPONINI in the last 168 hours. BNP (last 3 results) No results for input(s): PROBNP in the last 8760 hours. HbA1C: No results for input(s): HGBA1C in the last 72 hours. CBG: Recent Labs  Lab 05/26/19 2146 05/26/19 2254 05/27/19 0555 05/27/19 0850 05/27/19 1337  GLUCAP 154* 159* 121* 91 117*   Lipid Profile: No results for input(s): CHOL, HDL, LDLCALC, TRIG, CHOLHDL, LDLDIRECT in the last 72 hours. Thyroid Function Tests: No results for input(s): TSH, T4TOTAL, FREET4, T3FREE, THYROIDAB in the last 72 hours. Anemia Panel: No results for input(s): VITAMINB12, FOLATE, FERRITIN, TIBC, IRON, RETICCTPCT in the last 72 hours. Sepsis Labs: No results for input(s): PROCALCITON, LATICACIDVEN in  the last 168 hours.  Recent Results (from the past 240 hour(s))  Body fluid culture     Status: None   Collection Time: 05/19/19 12:02 PM   Specimen: PATH Cytology Pleural fluid  Result Value Ref Range Status   Specimen Description   Final    PLEURAL Performed at [facility], [address] [place], [place] [address]    Special Requests   Final    NONE Performed at [facility], [address] [place], [place] [address]    Gram Stain   Final    ABUNDANT WBC PRESENT,BOTH PMN AND MONONUCLEAR NO ORGANISMS SEEN    Culture   Final  RARE METHICILLIN RESISTANT STAPHYLOCOCCUS AUREUS CRITICAL RESULT CALLED TO, READ BACK BY AND VERIFIED WITH: RN  Dewayne Hatch [phone] FCP Performed at [facility], [address], [place], [place] [address]    Report Status 05/22/2019 FINAL  Final   Organism ID, Bacteria METHICILLIN RESISTANT STAPHYLOCOCCUS AUREUS  Final      Susceptibility   Methicillin resistant staphylococcus aureus - MIC*    CIPROFLOXACIN >=8 RESISTANT Resistant     ERYTHROMYCIN >=8 RESISTANT Resistant     GENTAMICIN <=0.5 SENSITIVE Sensitive     OXACILLIN >=4 RESISTANT Resistant     TETRACYCLINE <=1 SENSITIVE Sensitive     VANCOMYCIN 1 SENSITIVE Sensitive     TRIMETH/SULFA <=10 SENSITIVE Sensitive     CLINDAMYCIN <=0.25 SENSITIVE Sensitive     RIFAMPIN <=0.5 SENSITIVE Sensitive     Inducible Clindamycin NEGATIVE Sensitive     * RARE METHICILLIN RESISTANT STAPHYLOCOCCUS AUREUS  Aerobic/Anaerobic Culture (surgical/deep wound)     Status: None   Collection Time: 05/20/19 10:51 AM   Specimen: PATH Other; Body Fluid  Result Value Ref Range Status   Specimen Description   Final    FLUID Performed at [facility], [address], [place], [place] [address]    Special Requests   Final    RIGHT [facility] Performed at [facility], [address] [place], [place] [address]    Gram Stain   Final    ABUNDANT WBC PRESENT,  PREDOMINANTLY PMN RARE GRAM POSITIVE COCCI    Culture   Final    RARE METHICILLIN RESISTANT STAPHYLOCOCCUS AUREUS NO ANAEROBES ISOLATED Performed at [facility], [place] [address] [place], [place] [address]    Report Status 05/30/2019 FINAL  Final   Organism ID, Bacteria METHICILLIN RESISTANT STAPHYLOCOCCUS AUREUS  Final      Susceptibility   Methicillin resistant staphylococcus aureus - MIC*    CIPROFLOXACIN >=8 RESISTANT Resistant     ERYTHROMYCIN >=8 RESISTANT Resistant     GENTAMICIN <=0.5 SENSITIVE Sensitive     OXACILLIN >=4 RESISTANT Resistant     TETRACYCLINE <=1 SENSITIVE Sensitive     VANCOMYCIN 1 SENSITIVE Sensitive     TRIMETH/SULFA <=10 SENSITIVE Sensitive     CLINDAMYCIN <=0.25 SENSITIVE Sensitive     RIFAMPIN <=0.5 SENSITIVE Sensitive     Inducible Clindamycin NEGATIVE Sensitive     * RARE METHICILLIN RESISTANT STAPHYLOCOCCUS AUREUS  Aerobic/Anaerobic Culture (surgical/deep wound)     Status: None (Preliminary result)   Collection Time: 05/09/2019  3:41 PM   Specimen: PATH Cytology Pleural fluid; Body Fluid  Result Value Ref Range Status   Specimen Description FLUID LEFT PLEURAL  Final   Special Requests PT ON ANCEF  Final   Gram Stain NO WBC SEEN NO ORGANISMS SEEN   Final   Culture   Final    NO GROWTH 2 DAYS NO ANAEROBES ISOLATED; CULTURE IN PROGRESS FOR 5 DAYS Performed at [facility], [address], [place], [place] [address]    Report Status PENDING  Incomplete  Aerobic/Anaerobic Culture (surgical/deep wound)     Status: None (Preliminary result)   Collection Time: 05/05/2019  3:49 PM   Specimen: PATH Cytology Pleural fluid; Body Fluid  Result Value Ref Range Status   Specimen Description FLUID PLEURAL LEFT  Final   Special Requests NO 2 PT ON ANCEF  Final   Gram Stain   Final    RARE WBC PRESENT,BOTH PMN AND MONONUCLEAR NO ORGANISMS SEEN  Culture   Final    NO GROWTH 2 DAYS NO ANAEROBES ISOLATED; CULTURE IN PROGRESS FOR 5 DAYS Performed  at [place] [facility] [address], [place], [place] [address]    Report Status PENDING  Incomplete         Radiology Studies: DG CHEST PORT 1 VIEW  Result Date: 05/27/2019 CLINICAL DATA:  Empyema. EXAM: PORTABLE CHEST 1 VIEW COMPARISON:  05/21/2019 and 05/26/2019 FINDINGS: No significant change in the appearance of the chest since the prior exam. 2 left chest tubes remain in place. There is persistent peripheral loculated fluid or pleural thickening on the left with atelectasis and or loculated fluid at the left lung base medially. There are hazy areas of nodular infiltrate in the left upper lung zone as demonstrated on the prior CT scan. The small nodular densities in the right lung demonstrated on CT are not apparent on this chest x-ray. The heart size and pulmonary vascularity are normal. Bones are normal. IMPRESSION: 1. No change in the appearance of the chest since the prior study. No pneumothorax. 2. Persistent peripheral loculated fluid or pleural thickening on the left. Electronically Signed   By: Lorriane Shire M.D.   On: 05/27/2019 10:06   DG CHEST PORT 1 VIEW  Result Date: 05/26/2019 CLINICAL DATA:  Empyema.  Chest tube. EXAM: PORTABLE CHEST 1 VIEW COMPARISON:  05/11/2019 FINDINGS: Left apical as well as left basilar chest tubes unchanged. Lungs are adequately inflated with persistent hazy opacification over the left lung without significant change. Small to moderate left effusion unchanged. No definite pneumothorax. Right lung is clear. Cardiomediastinal silhouette and remainder of the exam is unchanged. IMPRESSION: Stable opacification over the left lung. Slight improvement left-sided effusion with basilar atelectasis. Two left-sided chest tubes unchanged. Electronically Signed   By: Marin Olp M.D.   On: 05/26/2019 07:51   DG Chest Port 1 View  Result Date: 05/05/2019 CLINICAL DATA:  Postop chest x-ray in PACU EXAM: PORTABLE CHEST 1 VIEW COMPARISON:  CT 05/17/2019,  radiograph 05/10/2019 FINDINGS: There is diffusely increased opacity through the left hemithorax with diminished size of the complex left pleural effusion within apical left chest tube and basilar left chest tube in place lung volumes are diminished in the right hemithorax with increased streaky opacity in vascular crowding favoring atelectatic change. Multifocal nodular opacities are again noted in the left lung. Lesions in the right lung are difficult to visualize. Cardiomediastinal contours are partially obscured by overlying opacity but appear grossly similar accounting for differences in technique and lung inflation. No acute osseous or soft tissue abnormality. IMPRESSION: 1. Persistent asymmetric opacity through the left hemithorax reflecting distributed pleural fluid but with likely overall decrease in size of the complex left pleural effusion. Apical and basal left chest tubes in place. 2. Multifocal nodular opacities in the left lung are grossly similar in appearance to prior studies. 3. Multifocal nodular opacities in the right lung are difficult to visualize. Electronically Signed   By: Lovena Le M.D.   On: 05/11/2019 19:17    Scheduled Meds: . sodium chloride   Intravenous Once  . bisacodyl  10 mg Oral Daily  . Chlorhexidine Gluconate Cloth  6 each Topical Daily  . feeding supplement (ENSURE ENLIVE)  237 mL Oral TID BM  . insulin aspart  0-24 Units Subcutaneous TID AC & HS  . ipratropium-albuterol  3 mL Nebulization BID  . levothyroxine  50 mcg Oral QAC breakfast  . metoprolol tartrate  25 mg Oral BID  .  multivitamin with minerals  1 tablet Oral Daily  . nicotine  21 mg Transdermal Daily  . senna-docusate  1 tablet Oral QHS  . sodium zirconium cyclosilicate  10 g Oral BID   Continuous Infusions: . sodium chloride    . sodium chloride 100 mL/hr at 05/27/19 1255  . ceftaroline (TEFLARO) 400 mg IVPB 300 mg (05/27/19 0708)     LOS: 3 days    Phillips Climes, MD [facility]  If 7PM-7AM, please contact night-coverage [email or web address] Password [email or web address] 05/27/2019, 2:15 PM

## 2019-05-27 NOTE — Progress Notes (Signed)
[address]       [place] [address]             [phone]      2 Days Post-Op Procedure(s) (LRB): LEFT VIDEO ASSISTED THORACOSCOPY (VATS)/DECORTICATION; LEFT LOWER LOBE WEDGE RESECTION; DRAINAGE OF EMPYEMA (Left) Subjective: Feels moderate pain in chest tube sites/chest, some abdominal discomfort  Objective: Vital signs in last 24 hours: Temp:  [98.1 F (36.7 C)-99.6 F (37.6 C)] 98.3 F (36.8 C) (12/25 0337) Pulse Rate:  [87-98] 87 (12/25 0337) Cardiac Rhythm: Normal sinus rhythm (12/24 2045) Resp:  [23-28] 23 (12/25 0337) BP: (115-141)/(65-80) 123/80 (12/25 0337) SpO2:  [95 %-99 %] 95 % (12/25 0727)  Hemodynamic parameters for last 24 hours:    Intake/Output from previous day: 12/24 0701 - 12/25 0700 In: 2087.2 [P.O.:480; I.V.:1463.8; IV Piggyback:143.4] Out: 1983 [DJSHF:0263; Drains:105; Chest Tube:228] Intake/Output this shift: Total I/O In: -  Out: 90 [Chest Tube:90]  General appearance: alert, cooperative and no distress Heart: regular rate and rhythm Lungs: diminished  left lower fields Abdomen: benign Extremities: + pedal edema Wound: dressings - some old bloody drainage  Lab Results: Recent Labs    05/26/19 0240 05/27/19 0325  WBC 12.7* 13.8*  HGB 8.2* 7.3*  HCT 26.4* 23.4*  PLT 404* 361   BMET:  Recent Labs    05/26/19 0240 05/27/19 0325  NA 137 137  K 5.7* 5.6*  CL 113* 113*  CO2 19* 18*  GLUCOSE 91 95  BUN 41* 43*  CREATININE 2.71* 2.54*  CALCIUM 8.0* 8.0*    PT/INR:  Recent Labs    06/02/2019 1400  LABPROT 14.3  INR 1.1   ABG    Component Value Date/Time   PHART 7.214 (L) 05/26/2019 1709   HCO3 21.7 05/24/2019 1709   TCO2 23 05/22/2019 1709   ACIDBASEDEF 6.0 (H) 05/11/2019 1709   O2SAT 100.0 05/22/2019 1709   CBG (last 3)  Recent Labs    05/26/19 2146 05/26/19 2254 05/27/19 0555  GLUCAP 154* 159* 121*    Meds Scheduled Meds: . sodium chloride   Intravenous Once  . bisacodyl  10 mg  Oral Daily  . Chlorhexidine Gluconate Cloth  6 each Topical Daily  . feeding supplement (ENSURE ENLIVE)  237 mL Oral TID BM  . insulin aspart  0-24 Units Subcutaneous TID AC & HS  . ipratropium-albuterol  3 mL Nebulization BID  . levothyroxine  50 mcg Oral QAC breakfast  . metoprolol tartrate  25 mg Oral BID  . multivitamin with minerals  1 tablet Oral Daily  . nicotine  21 mg Transdermal Daily  . senna-docusate  1 tablet Oral QHS  . sodium zirconium cyclosilicate  10 g Oral BID   Continuous Infusions: . sodium chloride    . sodium chloride 100 mL/hr at 05/19/2019 1850  . ceftaroline (TEFLARO) 400 mg IVPB 300 mg (05/27/19 0708)   PRN Meds:.Place/Maintain arterial line **AND** sodium chloride, acetaminophen **OR** acetaminophen, fentaNYL (SUBLIMAZE) injection, LORazepam, ondansetron (ZOFRAN) IV, oxyCODONE-acetaminophen, polyethylene glycol  Xrays CT CHEST WO CONTRAST  Result Date: 05/17/2019 CLINICAL DATA:  Empyema. History of septic emboli with cavitary lesions. Patient is scheduled for VATS procedure 05/26/2019. EXAM: CT CHEST WITHOUT CONTRAST TECHNIQUE: Multidetector CT imaging of the chest was performed following the standard protocol without IV contrast. COMPARISON:  Chest CT dated 05/22/2019. FINDINGS: Cardiovascular: Heart size appears stable. No thoracic aortic aneurysm. Mild aortic atherosclerosis. Mediastinum/Nodes: Oral contrast within the lower esophagus. Esophagus otherwise unremarkable in appearance.  No mass or enlarged lymph nodes appreciated within the mediastinum. Trachea is unremarkable. Lungs/Pleura: LEFT-sided chest tube has been placed in the interval, located within a persistent large pleural effusion on the LEFT which is likely loculated to some degree. There is associated compressive atelectasis w along the course of the LEFT pleural effusion. No RIGHT-sided pleural effusion. Additional atelectasis at the RIGHT lung base. Again noted are scattered pulmonary nodules  including a 2 cm LEFT upper lobe nodule (series 4, image 44) and a 1 cm LEFT apical nodule (image 27), posterior RIGHT lower lobe nodules measuring 0.7 cm and 1.3 cm (images 36 and 46 respectively, and a RIGHT lower lobe nodule measuring 1.1 cm (image 62). All of these nodules are similar in size. No new nodules or consolidations identified within either lung. No pneumothorax. Upper Abdomen: Limited images of the upper abdomen are unremarkable. Musculoskeletal: No acute or suspicious osseous finding. IMPRESSION: 1. LEFT-sided chest tube has been placed in the interval, located within a persistent large LEFT pleural effusion which is likely loculated to some degree. Associated compressive atelectasis along the course of the LEFT pleural effusion. 2. Additional atelectasis at the RIGHT lung base. No RIGHT-sided pleural effusion. 3. Stable bilateral pulmonary nodules, as detailed above, compatible with the previous description of septic emboli. No new nodules or consolidations identified within either lung. Aortic Atherosclerosis (ICD10-I70.0). Electronically Signed   By: Franki Cabot M.D.   On: 05/28/2019 09:36   DG CHEST PORT 1 VIEW  Result Date: 05/26/2019 CLINICAL DATA:  Empyema.  Chest tube. EXAM: PORTABLE CHEST 1 VIEW COMPARISON:  05/15/2019 FINDINGS: Left apical as well as left basilar chest tubes unchanged. Lungs are adequately inflated with persistent hazy opacification over the left lung without significant change. Small to moderate left effusion unchanged. No definite pneumothorax. Right lung is clear. Cardiomediastinal silhouette and remainder of the exam is unchanged. IMPRESSION: Stable opacification over the left lung. Slight improvement left-sided effusion with basilar atelectasis. Two left-sided chest tubes unchanged. Electronically Signed   By: Marin Olp M.D.   On: 05/26/2019 07:51   DG Chest Port 1 View  Result Date: 05/03/2019 CLINICAL DATA:  Postop chest x-ray in PACU EXAM: PORTABLE  CHEST 1 VIEW COMPARISON:  CT 05/27/2019, radiograph 05/08/2019 FINDINGS: There is diffusely increased opacity through the left hemithorax with diminished size of the complex left pleural effusion within apical left chest tube and basilar left chest tube in place lung volumes are diminished in the right hemithorax with increased streaky opacity in vascular crowding favoring atelectatic change. Multifocal nodular opacities are again noted in the left lung. Lesions in the right lung are difficult to visualize. Cardiomediastinal contours are partially obscured by overlying opacity but appear grossly similar accounting for differences in technique and lung inflation. No acute osseous or soft tissue abnormality. IMPRESSION: 1. Persistent asymmetric opacity through the left hemithorax reflecting distributed pleural fluid but with likely overall decrease in size of the complex left pleural effusion. Apical and basal left chest tubes in place. 2. Multifocal nodular opacities in the left lung are grossly similar in appearance to prior studies. 3. Multifocal nodular opacities in the right lung are difficult to visualize. Electronically Signed   By: Lovena Le M.D.   On: 05/26/2019 19:17    Results for orders placed or performed during the hospital encounter of 05/23/2019  Aerobic/Anaerobic Culture (surgical/deep wound)     Status: None (Preliminary result)   Collection Time: 05/12/2019  3:41 PM   Specimen: PATH Cytology Pleural fluid; Body  Fluid  Result Value Ref Range Status   Specimen Description FLUID LEFT PLEURAL  Final   Special Requests PT ON ANCEF  Final   Gram Stain NO WBC SEEN NO ORGANISMS SEEN   Final   Culture   Final    NO GROWTH < 12 HOURS Performed at [facility], [address], [place], [place] [address]    Report Status PENDING  Incomplete  Aerobic/Anaerobic Culture (surgical/deep wound)     Status: None (Preliminary result)   Collection Time: 05/08/2019  3:49 PM   Specimen: PATH  Cytology Pleural fluid; Body Fluid  Result Value Ref Range Status   Specimen Description FLUID PLEURAL LEFT  Final   Special Requests NO 2 PT ON ANCEF  Final   Gram Stain   Final    RARE WBC PRESENT,BOTH PMN AND MONONUCLEAR NO ORGANISMS SEEN    Culture   Final    NO GROWTH < 12 HOURS Performed at [facility], [address] [place], [place] [address]    Report Status PENDING  Incomplete    Assessment/Plan: S/P Procedure(s) (LRB): LEFT VIDEO ASSISTED THORACOSCOPY (VATS)/DECORTICATION; LEFT LOWER LOBE WEDGE RESECTION; DRAINAGE OF EMPYEMA (Left)  1 stable hemodynamics- d/c aline 2 sats ok on 2 liters, cont nebs/pulm toilet 3 CXR pending- mod CT drainage- 228 cc yesterday, 90 so far today - keep both drains for now 4 leukocytosis is pretty stable, cx NGSF- cont current abx 5 H/H close to transfusion threshold- cont to monitor closely 6 medical management as per primary, other consultants  LOS: 3 days    John Giovanni [facility] 05/27/2019 Pager [phone]- not for patient use

## 2019-05-27 NOTE — Progress Notes (Signed)
Pt's arterial line removed today. Pt responded well. No complications to report at this time. Pt will continue to be monitored.

## 2019-05-27 NOTE — Plan of Care (Signed)
  Problem: Education: Goal: Knowledge of General Education information will improve Description Including pain rating scale, medication(s)/side effects and non-pharmacologic comfort measures Outcome: Progressing   Problem: Health Behavior/Discharge Planning: Goal: Ability to manage health-related needs will improve Outcome: Progressing   

## 2019-05-28 ENCOUNTER — Inpatient Hospital Stay (HOSPITAL_COMMUNITY): Payer: Medicare Other

## 2019-05-28 LAB — GLUCOSE, CAPILLARY
Glucose-Capillary: 90 mg/dL (ref 70–99)
Glucose-Capillary: 95 mg/dL (ref 70–99)
Glucose-Capillary: 99 mg/dL (ref 70–99)
Glucose-Capillary: 125 mg/dL — ABNORMAL HIGH (ref 70–99)

## 2019-05-28 LAB — COMPREHENSIVE METABOLIC PANEL
ALT: 51 U/L — ABNORMAL HIGH (ref 0–44)
AST: 84 U/L — ABNORMAL HIGH (ref 15–41)
Albumin: 1.1 g/dL — ABNORMAL LOW (ref 3.5–5.0)
Alkaline Phosphatase: 252 U/L — ABNORMAL HIGH (ref 38–126)
Anion gap: 6 (ref 5–15)
BUN: 38 mg/dL — ABNORMAL HIGH (ref 8–23)
CO2: 19 mmol/L — ABNORMAL LOW (ref 22–32)
Calcium: 7.8 mg/dL — ABNORMAL LOW (ref 8.9–10.3)
Chloride: 113 mmol/L — ABNORMAL HIGH (ref 98–111)
Creatinine, Ser: 2.35 mg/dL — ABNORMAL HIGH (ref 0.61–1.24)
GFR calc Af Amer: 33 mL/min — ABNORMAL LOW (ref >60)
GFR calc non Af Amer: 28 mL/min — ABNORMAL LOW (ref >60)
Glucose, Bld: 85 mg/dL (ref 70–99)
Potassium: 5 mmol/L (ref 3.5–5.1)
Sodium: 138 mmol/L (ref 135–145)
Total Bilirubin: 0.5 mg/dL (ref 0.3–1.2)
Total Protein: 5.1 g/dL — ABNORMAL LOW (ref 6.5–8.1)

## 2019-05-28 LAB — CBC
HCT: 23.8 % — ABNORMAL LOW (ref 39.0–52.0)
Hemoglobin: 7.2 g/dL — ABNORMAL LOW (ref 13.0–17.0)
MCH: 29.1 pg (ref 26.0–34.0)
MCHC: 30.3 g/dL (ref 30.0–36.0)
MCV: 96.4 fL (ref 80.0–100.0)
Platelets: 328 10*3/uL (ref 150–400)
RBC: 2.47 MIL/uL — ABNORMAL LOW (ref 4.22–5.81)
RDW: 14.8 % (ref 11.5–15.5)
WBC: 10.9 10*3/uL — ABNORMAL HIGH (ref 4.0–10.5)
nRBC: 0 % (ref 0.0–0.2)

## 2019-05-28 MED ORDER — SODIUM CHLORIDE 0.9 % IV SOLN
400.0000 mg | Freq: Three times a day (TID) | INTRAVENOUS | Status: DC
  Administered 2019-05-28 – 2019-06-01 (×12): 400 mg via INTRAVENOUS
  Filled 2019-05-28 (×14): qty 400

## 2019-05-28 MED ORDER — PRO-STAT SUGAR FREE PO LIQD
30.0000 mL | Freq: Two times a day (BID) | ORAL | Status: DC
  Administered 2019-05-28 – 2019-06-07 (×22): 30 mL via ORAL
  Filled 2019-05-28 (×22): qty 30

## 2019-05-28 NOTE — Progress Notes (Signed)
PROGRESS NOTE    [name]  IRC:[id number] DOB: Dec 24, 1955 DOA: 06/02/2019 PCP: [name], MD   Brief Narrative:   Alan Small is a 63 y.o. male with medical history significant of solitary left kidney, CKD stage IV, left staghorn calculus SP stenting, urethral stricture SP urethroplasty, schizophrenia, clinically diagnosed COPD, depression.  Patient was admitted at [facility] between February 10, 2019 and March 02, 2019. Patient was treated for pyelonephritis and retroperitoneal fluid collection concerning for abscess. Urine culture grew MDR E. coli. Patient underwent aspiration of the abscess on 9/17 with JP drain placement. Patient was started on IV imipenem with a PICC line. Summary ofhisactive problems in the hospital is as following.Underwent left percutaneous nephrolithotomy 03/28/2019 and ureteral stent placement at the same time. Underwent laser lithotripsy and basket extraction on 04/05/2019. Patient was recommended to continue the antibiotics until April 16, 2019.  Patient presented to [facility] on 05/11/2019 with shortness of breath left leg pain and fever and nausea and vomiting. CT scan showed evidence of stone on the left side. Patient was initially placed on the BiPAP. His oxygenation worsened and patient actually required intubation in the emergency department. Urology was consulted and patient underwent urgent cystoscopy and left ureteral stent placement. Patient was started on broad-spectrum antibiotics and his blood cultures came back positive for MRSA. Patient was extubated on 05/20/2019 and since then has remained on nasal cannula. Patient's PICC line was removed on 05/12/2019 and a central line was placed which was removed on 05/18/2019. Foley catheter was inserted on 05/11/2019 after the urology procedure and removed on 05/18/2019. TEE showed no evidence of endocarditis but the patient does have evidence of septic emboli with cavitary lesions on  bilateral lung parenchyma therefore patient is treated as infective endocarditis. 05/19/2019 left thoracentesis/pleural fluid culture came back positive for MRSA. Report showed loculated effusion post procedure. 05/20/2019 had pyomyositis underwent right-sided drain placement after incision and debridement. -Due to patient's complaint of back pain MRI thoracic and lumbar spine were performed on 05/21/2019 which showed evidence of epidural abscess as well as facet joint infection. [facility] neurosurgery was consulted who recommended conservative management. Dr. [name] was on call. -Due to worsening shortness of breath CT chest performed on 05/22/2019 showed evidence of recollection of pleural fluid. Chest tube was inserted by PCCM on 05/22/2019. -VATS 12/23 - tolerated well; on Ceftaroline now. Clinically improving.  Subjective: No acute issues or events overnight, patient still reports some chest pain, but overall is controlled, denies headache, fever or chills .  Assessment & Plan:   Principal Problem:   Empyema of pleural space (HCC) Active Problems:   Schizophrenia ([place])   Hypothyroidism   Severe sepsis (HCC)   Pyohydronephrosis   Kidney stone   Chronic kidney disease, stage IV (severe) (HCC)   COPD (chronic obstructive pulmonary disease) (HCC)   Atrial flutter (HCC)   MRSA bacteremia   Septic embolism (HCC)   Epidural abscess   ETOH abuse   Severe sepsiswith septic shock, resolved, POA MRSA bacteremia with disseminated infection: likely from previous central line, POA Acute hypoxic respiratory failure due to COPD exacerbation in the setting of Severe sepsis -POA Pyomyositis SP right-sided flank drain placement POA Empyema SP thoracentesis/VATS POA. And now chest tube placement Epidural abscess as well as L4-L5 infection SpO2: 97 % O2 Flow Rate (L/min): 0.5 L/min - Blood cultures came back positive for MRSA; Urine culture came back positive for VRE. -TEE negative for vegetation,  but CT chest was positive for evidence  of septic emboli therefore patient is being treated as a possible right-sided infective endocarditis. - TCTS following - VATS with decortication on 05/14/2019 w/ 2 chest tubes - tolerating well; pain controlled; continue Flutter/IS, PT consulted, 300 cc output from chest tube yesterday, the surgery will discontinue anterior chest tube today. - Urology was initially consulted as well which currently has signed off as below. - Neurosurgery at [facility] with Dr. [name] aware who recommends conservative management with IV antibiotics as long as the patient does not have any symptoms (remains AOx4) -Infectious disease following, patient currently transitioned to ceftaroline, to finish total of 6 weeks,  So far repeat blood cultures have been negative.  Obstructing/infected renal calculus, Concurrent VRE UTI, likely POA SP emergency ureteral stent placement - Cystoscopy on 05/11/2019 and also was found to have a bulbar urethral stricture.  - ID following, patient transitioned to ceftaroline - The stricture was dilated.  - The stent was placed in the left ureter.  - The urine culture on 05/11/2019 was negative.  - A repeat urine culture was sent on 05/16/2019 and that is positive for VRE - Urology no longer following - call back if needed  A. fib/flutter with rapid ventricular rate, resolved - Currently in sinus tach with few PVCs/PACs - Previously on amiodaron drip, for now on lopressor with adequate control - Anticoagulation not recommended due to history of fall - Echo with normal LV function  Alcohol withdrawal syndrome, resolved - Monitor for withdrawal - less likely now given timeframe and patient's prolonged hospital stay  Acute on chronic renal failure, improving Solitary kidney Lab Results  Component Value Date   CREATININE 2.35 (H) 05/28/2019   CREATININE 2.54 (H) 05/27/2019   CREATININE 2.71 (H) 05/26/2019  - Unknown baseline - Peak creatinine  at this stay 3.2 (limited chart review baseline around 2.5 with peak of 5 01/22/19)  Paranoid schizophrenia - Resume home medications as soon as feasible  - Patient is a guardian of the state - likely for this reason although have not verified with family/friends  Hypernatremia/hyperkalemia - Replete and recheck - Lokelma x1 24th - K 5.7   DVT prophylaxis: Heparin Code Status: Full Family Communication: None at bedside Disposition Plan: Inpatient, pending further clinical evaluation and improvement.  Consultants:   TCTS  Infectious disease  Neurology  General surgery    Objective: Vitals:   05/28/19 0800 05/28/19 1000 05/28/19 1114 05/28/19 1129  BP: (!) 171/90 (!) 149/81 (!) 158/82   Pulse: 93 82 86 84  Resp: 15 18 17    Temp:   98.6 F (37 C)   TempSrc:   Oral   SpO2: 99% 96% 97%   Weight:      Height:        Intake/Output Summary (Last 24 hours) at 05/28/2019 1549 Last data filed at 05/28/2019 1200 Gross per 24 hour  Intake 1262.84 ml  Output 2140 ml  Net -877.16 ml   Filed Weights   05/27/2019 2200  Weight: 90.1 kg    Examination:  Awake Alert, Oriented X 3, No new F.N deficits, Normal affect Symmetrical Chest wall movement, left chest tube x2 RRR,No Gallops,Rubs or new Murmurs, No Parasternal Heave +ve B.Sounds, Abd Soft, No tenderness, No rebound - guarding or rigidity. No Cyanosis, Clubbing or edema, No new Rash or bruise     Data Reviewed: I have personally reviewed following labs and imaging studies  CBC: Recent Labs  Lab 05/10/2019 0735 05/09/2019 0243 05/20/2019 1709 05/26/19 0240 05/27/19 0325 05/28/19 0404  WBC 10.5 9.4  --  12.7* 13.8* 10.9*  HGB 8.0* 7.6* 6.8* 8.2* 7.3* 7.2*  HCT 25.5* 25.0* 20.0* 26.4* 23.4* 23.8*  MCV 89.8 94.7  --  94.3 94.4 96.4  PLT 410* 400  --  404* 361 716   Basic Metabolic Panel: Recent Labs  Lab 05/22/19 0545 05/23/19 0418 05/14/2019 0735 05/03/2019 0243 05/14/2019 1709 05/26/19 0240 05/27/19 0325  05/28/19 0404  NA 140 139 138 137 140 137 137 138  K 5.3* 5.1 5.2* 5.3* 5.5* 5.7* 5.6* 5.0  CL 111 111 111 111  --  113* 113* 113*  CO2 22 18* 20* 20*  --  19* 18* 19*  GLUCOSE 124* 94 89 103*  --  91 95 85  BUN 68* 57* 54* 50*  --  41* 43* 38*  CREATININE 2.98* 2.85* 2.51* 2.62*  --  2.71* 2.54* 2.35*  CALCIUM 7.8* 7.9* 8.1* 8.0*  --  8.0* 8.0* 7.8*  MG 2.4  --   --   --   --   --   --   --   PHOS 4.4 5.1* 4.3  --   --   --   --   --    GFR: Estimated Creatinine Clearance: 34.9 mL/min (A) (by C-G formula based on SCr of 2.35 mg/dL (H)). Liver Function Tests: Recent Labs  Lab 05/22/19 1639 05/23/2019 0735 05/22/2019 0243 05/26/19 0240 05/27/19 0325 05/28/19 0404  AST 37  --  45* 29 35 84*  ALT 26  --  34 26 25 51*  ALKPHOS 181*  --  228* 161* 170* 252*  BILITOT 1.5*  --  1.0 1.0 0.6 0.5  PROT 6.4*  --  5.6* 5.2* 5.2* 5.1*  ALBUMIN 1.6* 1.5* 1.2* 1.3* 1.2* 1.1*   No results for input(s): LIPASE, AMYLASE in the last 168 hours. Recent Labs  Lab 05/22/19 1639  AMMONIA 12   Coagulation Profile: Recent Labs  Lab 05/26/2019 1400  INR 1.1   Cardiac Enzymes: No results for input(s): CKTOTAL, CKMB, CKMBINDEX, TROPONINI in the last 168 hours. BNP (last 3 results) No results for input(s): PROBNP in the last 8760 hours. HbA1C: No results for input(s): HGBA1C in the last 72 hours. CBG: Recent Labs  Lab 05/27/19 1337 05/27/19 1611 05/27/19 2130 05/28/19 0609 05/28/19 1152  GLUCAP 117* 98 132* 90 95   Lipid Profile: No results for input(s): CHOL, HDL, LDLCALC, TRIG, CHOLHDL, LDLDIRECT in the last 72 hours. Thyroid Function Tests: No results for input(s): TSH, T4TOTAL, FREET4, T3FREE, THYROIDAB in the last 72 hours. Anemia Panel: No results for input(s): VITAMINB12, FOLATE, FERRITIN, TIBC, IRON, RETICCTPCT in the last 72 hours. Sepsis Labs: No results for input(s): PROCALCITON, LATICACIDVEN in the last 168 hours.  Recent Results (from the past 240 hour(s))  Body fluid  culture     Status: None   Collection Time: 05/19/19 12:02 PM   Specimen: PATH Cytology Pleural fluid  Result Value Ref Range Status   Specimen Description   Final    PLEURAL Performed at [facility], [address], [place], [place] [address]    Special Requests   Final    NONE Performed at [facility], [address] [place], [place] [address]    Gram Stain   Final    ABUNDANT WBC PRESENT,BOTH PMN AND MONONUCLEAR NO ORGANISMS SEEN    Culture   Final    RARE METHICILLIN RESISTANT STAPHYLOCOCCUS AUREUS CRITICAL RESULT CALLED TO, READ BACK BY AND VERIFIED WITH: RN  Dewayne Hatch [id number] FCP Performed at [place] [address], [place], [place] [address]    Report Status 05/22/2019 FINAL  Final   Organism ID, Bacteria METHICILLIN RESISTANT STAPHYLOCOCCUS AUREUS  Final      Susceptibility   Methicillin resistant staphylococcus aureus - MIC*    CIPROFLOXACIN >=8 RESISTANT Resistant     ERYTHROMYCIN >=8 RESISTANT Resistant     GENTAMICIN <=0.5 SENSITIVE Sensitive     OXACILLIN >=4 RESISTANT Resistant     TETRACYCLINE <=1 SENSITIVE Sensitive     VANCOMYCIN 1 SENSITIVE Sensitive     TRIMETH/SULFA <=10 SENSITIVE Sensitive     CLINDAMYCIN <=0.25 SENSITIVE Sensitive     RIFAMPIN <=0.5 SENSITIVE Sensitive     Inducible Clindamycin NEGATIVE Sensitive     * RARE METHICILLIN RESISTANT STAPHYLOCOCCUS AUREUS  Aerobic/Anaerobic Culture (surgical/deep wound)     Status: None   Collection Time: 05/20/19 10:51 AM   Specimen: PATH Other; Body Fluid  Result Value Ref Range Status   Specimen Description   Final    FLUID Performed at [facility], [address], [place], [place] [address]    Special Requests   Final    RIGHT [facility] Performed at [facility], [address] [place], [place] [address]    Gram Stain   Final    ABUNDANT WBC PRESENT, PREDOMINANTLY PMN RARE GRAM POSITIVE COCCI    Culture   Final    RARE METHICILLIN  RESISTANT STAPHYLOCOCCUS AUREUS NO ANAEROBES ISOLATED Performed at [facility], [place] [address], [place], [place] [address]    Report Status 05/05/2019 FINAL  Final   Organism ID, Bacteria METHICILLIN RESISTANT STAPHYLOCOCCUS AUREUS  Final      Susceptibility   Methicillin resistant staphylococcus aureus - MIC*    CIPROFLOXACIN >=8 RESISTANT Resistant     ERYTHROMYCIN >=8 RESISTANT Resistant     GENTAMICIN <=0.5 SENSITIVE Sensitive     OXACILLIN >=4 RESISTANT Resistant     TETRACYCLINE <=1 SENSITIVE Sensitive     VANCOMYCIN 1 SENSITIVE Sensitive     TRIMETH/SULFA <=10 SENSITIVE Sensitive     CLINDAMYCIN <=0.25 SENSITIVE Sensitive     RIFAMPIN <=0.5 SENSITIVE Sensitive     Inducible Clindamycin NEGATIVE Sensitive     * RARE METHICILLIN RESISTANT STAPHYLOCOCCUS AUREUS  Aerobic/Anaerobic Culture (surgical/deep wound)     Status: None (Preliminary result)   Collection Time: 05/24/2019  3:41 PM   Specimen: PATH Cytology Pleural fluid; Body Fluid  Result Value Ref Range Status   Specimen Description FLUID LEFT PLEURAL  Final   Special Requests PT ON ANCEF  Final   Gram Stain NO WBC SEEN NO ORGANISMS SEEN   Final   Culture   Final    NO GROWTH 3 DAYS NO ANAEROBES ISOLATED; CULTURE IN PROGRESS FOR 5 DAYS Performed at [facility], [address] [place],  [address]    Report Status PENDING  Incomplete  Aerobic/Anaerobic Culture (surgical/deep wound)     Status: None (Preliminary result)   Collection Time: 05/13/2019  3:49 PM   Specimen: PATH Cytology Pleural fluid; Body Fluid  Result Value Ref Range Status   Specimen Description FLUID PLEURAL LEFT  Final   Special Requests NO 2 PT ON ANCEF  Final   Gram Stain   Final    RARE WBC PRESENT,BOTH PMN AND MONONUCLEAR NO ORGANISMS SEEN    Culture   Final    NO GROWTH 3 DAYS NO ANAEROBES ISOLATED; CULTURE IN PROGRESS  FOR 5 DAYS Performed at [facility], [place] [address] [place], [place] [address]    Report Status  PENDING  Incomplete         Radiology Studies: DG CHEST PORT 1 VIEW  Result Date: 05/28/2019 CLINICAL DATA:  Empyema. EXAM: PORTABLE CHEST 1 VIEW COMPARISON:  05/27/2019 FINDINGS: Left chest tubes remain in place. Similar peripheral opacity left hemithorax compatible with pleural fluid and/or thickening. Retrocardiac collapse/consolidation is similar. The cardio pericardial silhouette is enlarged. Interstitial markings are diffusely coarsened with chronic features. Telemetry leads overlie the chest. IMPRESSION: Stable exam.  No new or acute interval findings. Electronically Signed   By: Misty Stanley M.D.   On: 05/28/2019 09:42   DG CHEST PORT 1 VIEW  Result Date: 05/27/2019 CLINICAL DATA:  Empyema. EXAM: PORTABLE CHEST 1 VIEW COMPARISON:  05/27/2019 and 05/26/2019 FINDINGS: No significant change in the appearance of the chest since the prior exam. 2 left chest tubes remain in place. There is persistent peripheral loculated fluid or pleural thickening on the left with atelectasis and or loculated fluid at the left lung base medially. There are hazy areas of nodular infiltrate in the left upper lung zone as demonstrated on the prior CT scan. The small nodular densities in the right lung demonstrated on CT are not apparent on this chest x-ray. The heart size and pulmonary vascularity are normal. Bones are normal. IMPRESSION: 1. No change in the appearance of the chest since the prior study. No pneumothorax. 2. Persistent peripheral loculated fluid or pleural thickening on the left. Electronically Signed   By: Lorriane Shire M.D.   On: 05/27/2019 10:06    Scheduled Meds: . sodium chloride   Intravenous Once  . bisacodyl  10 mg Oral Daily  . Chlorhexidine Gluconate Cloth  6 each Topical Daily  . feeding supplement (ENSURE ENLIVE)  237 mL Oral TID BM  . feeding supplement (PRO-STAT SUGAR FREE 64)  30 mL Oral BID  . insulin aspart  0-24 Units Subcutaneous TID AC & HS  . ipratropium-albuterol  3  mL Nebulization BID  . levothyroxine  50 mcg Oral QAC breakfast  . metoprolol tartrate  25 mg Oral BID  . multivitamin with minerals  1 tablet Oral Daily  . nicotine  21 mg Transdermal Daily  . senna-docusate  1 tablet Oral QHS   Continuous Infusions: . sodium chloride    . sodium chloride 100 mL/hr at 05/28/19 0416  . ceftaroline (TEFLARO) 400 mg IVPB 400 mg (05/28/19 1408)     LOS: 4 days    Phillips Climes, MD [facility]  If 7PM-7AM, please contact night-coverage [email or web address] Password [facility] 05/28/2019, 3:49 PM

## 2019-05-28 NOTE — Plan of Care (Signed)
Poc progressing.  

## 2019-05-28 NOTE — Progress Notes (Signed)
Initial Nutrition Assessment  DOCUMENTATION CODES:   Not applicable  INTERVENTION:  -Continue Ensure Enlive po BID, each supplement provides 350 kcal and 20 grams of protein -Continue MVI daily  -Encourage po intake  -Prostat 30 ml po BID, each supplement provides 100 kcal and 15 grams of protein   NUTRITION DIAGNOSIS:   Increased nutrient needs related to acute illness, wound healing(empyema of pleural space s/p left thoracoscopy, left lower lobe resection, and drainage) as evidenced by estimated needs(patient eating 44% average of last 4 recorded meals).   GOAL:   Patient will meet greater than or equal to 90% of their needs   MONITOR:   PO intake, Labs, I & O's, Supplement acceptance, Weight trends, Skin  REASON FOR ASSESSMENT:   Consult Assessment of nutrition requirement/status  ASSESSMENT:  RD working remotely.  63 year old male with past medical history of COPD, solitary left kidney, CKD IV, left staghorn calculus s/p stenting, urethral stricture s/p urethroplasty, schizophrenia, depression who was recently admitted to [facility] 12/19 for epidural abscess and facet joint infection and transferred to [facility] 12/20 for further evaluation by cardiothoracic surgery s/p CT of chest showed evidence of recollection of pleural fluid.  12/9 - intubated 12/17 - left thoracentesis/pleural fluid positive for MRSA 12/18 - pyomyositis; underwent right-sided drain placement 12/18 - extubated 12/20- chest tube  Patient is s/p Left video assisted thoracoscopy; left lower lobe wedge resection, and drainage of left empyema on 12/23.  Per notes, patient anterior chest tube removed today, remains DOE but feeling overall better.  Patient with varied po intake, consuming 0-100% of the last 4 documented meals and drinking Ensure nutrition supplements. RD to continue Ensure supplements TID and provide additional protein supplement.   I/Os: +132 since admit   -967 ml x 24 hrs UOP: 1875 ml x 24  hrs     Drains 55 ml x 24 hrs    Chest tube 300 ml x 24 hrs  Medications reviewed and include: SS novolog, MVI, Teflaro  Labs: CBGs 90-132 x 24 hrs, BUN 38 (H), Cr 2.35 (H), WBC 10.9 (H), Hgb 7.2 (L)   NUTRITION - FOCUSED PHYSICAL EXAM: Unable to complete at this time, RD working remotely.  Diet Order:   Diet Order            Diet Heart Room service appropriate? Yes with Assist; Fluid consistency: Thin  Diet effective now              EDUCATION NEEDS:   No education needs have been identified at this time  Skin:  Skin Assessment: Reviewed RN Assessment(incision closed;right chest;left flank)  Last BM:  12/22  Height:   Ht Readings from Last 1 Encounters:  05/05/2019 6' (1.829 m)    Weight:   Wt Readings from Last 1 Encounters:  05/22/2019 90.1 kg    Ideal Body Weight:  80.9 kg  BMI:  Body mass index is 26.94 kg/m.  Estimated Nutritional Needs:   Kcal:  2075-2250  Protein:  135-144  Fluid:  >/= 2 L/day   Lajuan Lines, RD, LDN Clinical Nutrition [facility] Telephone [phone] After Hours/Weekend Pager: [phone]

## 2019-05-28 NOTE — Progress Notes (Signed)
PHARMACY NOTE:  ANTIMICROBIAL RENAL DOSAGE ADJUSTMENT  Current antimicrobial regimen includes a mismatch between antimicrobial dosage and estimated renal function.  As per policy approved by the Pharmacy & Therapeutics and Medical Executive Committees, the antimicrobial dosage will be adjusted accordingly.  Current antimicrobial dosage:  ceftaroline 300 mg IV every 8 hours   Indication: disseminated MRSA infection   Renal Function: improving, Creatinine has been trending down and urine output has been increasing.   Estimated Creatinine Clearance: 34.9 mL/min (A) (by C-G formula based on SCr of 2.35 mg/dL (H)).     Antimicrobial dosage has been changed to:  ceftaroline 400 mg IV every 8 hours    Thank you for allowing pharmacy to be a part of this patient's care.  Eddie Candle, PharmD PGY-1 Pharmacy Resident   Please check amion for clinical pharmacist contact number   05/28/2019 10:09 AM

## 2019-05-28 NOTE — Progress Notes (Signed)
[address]       [place] [address]             [phone]      3 Days Post-Op Procedure(s) (LRB): LEFT VIDEO ASSISTED THORACOSCOPY (VATS)/DECORTICATION; LEFT LOWER LOBE WEDGE RESECTION; DRAINAGE OF EMPYEMA (Left) Subjective: Remains DOE but feeling overall better. Not ambulating  Objective: Vital signs in last 24 hours: Temp:  [97.8 F (36.6 C)-99.2 F (37.3 C)] 98.4 F (36.9 C) (12/26 0400) Pulse Rate:  [81-107] 83 (12/26 0400) Cardiac Rhythm: Normal sinus rhythm (12/25 0930) Resp:  [13-28] 13 (12/26 0400) BP: (124-150)/(72-92) 150/83 (12/26 0400) SpO2:  [91 %-100 %] 99 % (12/26 0400)  Hemodynamic parameters for last 24 hours:    Intake/Output from previous day: 12/25 0701 - 12/26 0700 In: 1262.8 [P.O.:120; I.V.:36.2; IV Piggyback:1106.6] Out: 2230 [Urine:1875; Drains:55; Chest Tube:300] Intake/Output this shift: No intake/output data recorded.  General appearance: alert, cooperative and no distress Heart: regular rate and rhythm Lungs: fair air exchange, dim more on left Abdomen: benign Extremities: + pedal edema Wound: dressings intact  Lab Results: Recent Labs    05/27/19 0325 05/28/19 0404  WBC 13.8* 10.9*  HGB 7.3* 7.2*  HCT 23.4* 23.8*  PLT 361 328   BMET:  Recent Labs    05/27/19 0325 05/28/19 0404  NA 137 138  K 5.6* 5.0  CL 113* 113*  CO2 18* 19*  GLUCOSE 95 85  BUN 43* 38*  CREATININE 2.54* 2.35*  CALCIUM 8.0* 7.8*    PT/INR:  Recent Labs    05/26/2019 1400  LABPROT 14.3  INR 1.1   ABG    Component Value Date/Time   PHART 7.214 (L) 05/15/2019 1709   HCO3 21.7 05/14/2019 1709   TCO2 23 05/29/2019 1709   ACIDBASEDEF 6.0 (H) 06/01/2019 1709   O2SAT 100.0 05/29/2019 1709   CBG (last 3)  Recent Labs    05/27/19 1611 05/27/19 2130 05/28/19 0609  GLUCAP 98 132* 90    Meds Scheduled Meds: . sodium chloride   Intravenous Once  . bisacodyl  10 mg Oral Daily  . Chlorhexidine Gluconate Cloth  6 each  Topical Daily  . feeding supplement (ENSURE ENLIVE)  237 mL Oral TID BM  . insulin aspart  0-24 Units Subcutaneous TID AC & HS  . ipratropium-albuterol  3 mL Nebulization BID  . levothyroxine  50 mcg Oral QAC breakfast  . metoprolol tartrate  25 mg Oral BID  . multivitamin with minerals  1 tablet Oral Daily  . nicotine  21 mg Transdermal Daily  . senna-docusate  1 tablet Oral QHS   Continuous Infusions: . sodium chloride    . sodium chloride 100 mL/hr at 05/28/19 0416  . ceftaroline (TEFLARO) 400 mg IVPB 300 mg (05/28/19 0600)   PRN Meds:.Place/Maintain arterial line **AND** sodium chloride, acetaminophen **OR** acetaminophen, fentaNYL (SUBLIMAZE) injection, LORazepam, ondansetron (ZOFRAN) IV, oxyCODONE-acetaminophen, polyethylene glycol  Xrays DG CHEST PORT 1 VIEW  Result Date: 05/27/2019 CLINICAL DATA:  Empyema. EXAM: PORTABLE CHEST 1 VIEW COMPARISON:  05/07/2019 and 05/26/2019 FINDINGS: No significant change in the appearance of the chest since the prior exam. 2 left chest tubes remain in place. There is persistent peripheral loculated fluid or pleural thickening on the left with atelectasis and or loculated fluid at the left lung base medially. There are hazy areas of nodular infiltrate in the left upper lung zone as demonstrated on the prior CT scan. The small nodular densities in the right lung demonstrated  on CT are not apparent on this chest x-ray. The heart size and pulmonary vascularity are normal. Bones are normal. IMPRESSION: 1. No change in the appearance of the chest since the prior study. No pneumothorax. 2. Persistent peripheral loculated fluid or pleural thickening on the left. Electronically Signed   By: Lorriane Shire M.D.   On: 05/27/2019 10:06    Assessment/Plan: S/P Procedure(s) (LRB): LEFT VIDEO ASSISTED THORACOSCOPY (VATS)/DECORTICATION; LEFT LOWER LOBE WEDGE RESECTION; DRAINAGE OF EMPYEMA (Left)  1 hemodyn stable with htn at times 2 wavers between 1 liter Connell and  RA- cont pulm toilet as able. Will get PT consult to assist with mobilizing 3 CXR yesterday fairly stable in appearance will repeat in am 4 CT 300 cc yesterday, serosang- will d/c ant chest tube today 5 Tmax 99.2 - leukocytosis resolved, cont abx as per ID 6 renal fxn is improving 7 other care per hospitalist/other consultants  LOS: 4 days    John Giovanni [facility] 05/28/2019 Pager [phone]

## 2019-05-28 NOTE — Progress Notes (Signed)
Pt's anterior chest tube removed today.  Pt responded well.  All tubing intact.  No complications to report at this time.  Pt will continue to be monitored.

## 2019-05-29 ENCOUNTER — Inpatient Hospital Stay (HOSPITAL_COMMUNITY): Payer: Medicare Other

## 2019-05-29 LAB — CBC
HCT: 24.9 % — ABNORMAL LOW (ref 39.0–52.0)
Hemoglobin: 7.7 g/dL — ABNORMAL LOW (ref 13.0–17.0)
MCH: 29.4 pg (ref 26.0–34.0)
MCHC: 30.9 g/dL (ref 30.0–36.0)
MCV: 95 fL (ref 80.0–100.0)
Platelets: 357 10*3/uL (ref 150–400)
RBC: 2.62 MIL/uL — ABNORMAL LOW (ref 4.22–5.81)
RDW: 15 % (ref 11.5–15.5)
WBC: 10.7 10*3/uL — ABNORMAL HIGH (ref 4.0–10.5)
nRBC: 0 % (ref 0.0–0.2)

## 2019-05-29 LAB — BPAM RBC
Blood Product Expiration Date: 202101222359
Blood Product Expiration Date: 202101222359
ISSUE DATE / TIME: 202012231626
ISSUE DATE / TIME: 202012231626
Unit Type and Rh: 5100
Unit Type and Rh: 5100

## 2019-05-29 LAB — TYPE AND SCREEN
ABO/RH(D): O POS
Antibody Screen: NEGATIVE
Unit division: 0
Unit division: 0

## 2019-05-29 LAB — COMPREHENSIVE METABOLIC PANEL
ALT: 51 U/L — ABNORMAL HIGH (ref 0–44)
AST: 64 U/L — ABNORMAL HIGH (ref 15–41)
Albumin: 1.1 g/dL — ABNORMAL LOW (ref 3.5–5.0)
Alkaline Phosphatase: 267 U/L — ABNORMAL HIGH (ref 38–126)
Anion gap: 5 (ref 5–15)
BUN: 42 mg/dL — ABNORMAL HIGH (ref 8–23)
CO2: 19 mmol/L — ABNORMAL LOW (ref 22–32)
Calcium: 8.2 mg/dL — ABNORMAL LOW (ref 8.9–10.3)
Chloride: 112 mmol/L — ABNORMAL HIGH (ref 98–111)
Creatinine, Ser: 2.34 mg/dL — ABNORMAL HIGH (ref 0.61–1.24)
GFR calc Af Amer: 33 mL/min — ABNORMAL LOW (ref >60)
GFR calc non Af Amer: 28 mL/min — ABNORMAL LOW (ref >60)
Glucose, Bld: 82 mg/dL (ref 70–99)
Potassium: 5.5 mmol/L — ABNORMAL HIGH (ref 3.5–5.1)
Sodium: 136 mmol/L (ref 135–145)
Total Bilirubin: 0.6 mg/dL (ref 0.3–1.2)
Total Protein: 5.3 g/dL — ABNORMAL LOW (ref 6.5–8.1)

## 2019-05-29 LAB — HEPATITIS PANEL, ACUTE
HCV Ab: NONREACTIVE
Hep A IgM: NONREACTIVE
Hep B C IgM: NONREACTIVE
Hepatitis B Surface Ag: NONREACTIVE

## 2019-05-29 LAB — GLUCOSE, CAPILLARY
Glucose-Capillary: 115 mg/dL — ABNORMAL HIGH (ref 70–99)
Glucose-Capillary: 111 mg/dL — ABNORMAL HIGH (ref 70–99)
Glucose-Capillary: 140 mg/dL — ABNORMAL HIGH (ref 70–99)
Glucose-Capillary: 78 mg/dL (ref 70–99)

## 2019-05-29 LAB — FERRITIN: Ferritin: 406 ng/mL — ABNORMAL HIGH (ref 24–336)

## 2019-05-29 LAB — IRON AND TIBC
Iron: 12 ug/dL — ABNORMAL LOW (ref 45–182)
Saturation Ratios: 9 % — ABNORMAL LOW (ref 17.9–39.5)
TIBC: 132 ug/dL — ABNORMAL LOW (ref 250–450)
UIBC: 120 ug/dL

## 2019-05-29 LAB — VITAMIN B12: Vitamin B-12: 704 pg/mL (ref 180–914)

## 2019-05-29 LAB — POTASSIUM: Potassium: 4.9 mmol/L (ref 3.5–5.1)

## 2019-05-29 MED ORDER — SODIUM BICARBONATE 650 MG PO TABS
1300.0000 mg | ORAL_TABLET | Freq: Two times a day (BID) | ORAL | Status: AC
  Administered 2019-05-29 – 2019-05-30 (×4): 1300 mg via ORAL
  Filled 2019-05-29 (×4): qty 2

## 2019-05-29 MED ORDER — HEPARIN SODIUM (PORCINE) 5000 UNIT/ML IJ SOLN
5000.0000 [IU] | Freq: Three times a day (TID) | INTRAMUSCULAR | Status: DC
  Administered 2019-05-29 – 2019-06-08 (×31): 5000 [IU] via SUBCUTANEOUS
  Filled 2019-05-29 (×30): qty 1

## 2019-05-29 MED ORDER — SODIUM ZIRCONIUM CYCLOSILICATE 10 G PO PACK
10.0000 g | PACK | Freq: Once | ORAL | Status: AC
  Administered 2019-05-29: 17:00:00 10 g via ORAL
  Filled 2019-05-29: qty 1

## 2019-05-29 MED ORDER — CALCIUM GLUCONATE-NACL 1-0.675 GM/50ML-% IV SOLN
1.0000 g | Freq: Once | INTRAVENOUS | Status: AC
  Administered 2019-05-29: 10:00:00 1000 mg via INTRAVENOUS
  Filled 2019-05-29: qty 50

## 2019-05-29 MED ORDER — SODIUM POLYSTYRENE SULFONATE 15 GM/60ML PO SUSP
15.0000 g | Freq: Once | ORAL | Status: AC
  Administered 2019-05-29: 11:00:00 15 g via ORAL
  Filled 2019-05-29: qty 60

## 2019-05-29 NOTE — Progress Notes (Signed)
PROGRESS NOTE  Alan Small YFV:[id number] DOB: 1955-12-04 DOA: 05/30/2019 PCP: [name], MD  HPI/Recap of past 24 hours: [name] a 63 y.o.malewith medical history significant ofsolitary left kidney, CKD stage IV, left staghorn calculus SP stenting, urethral stricture SP urethroplasty, schizophrenia, clinically diagnosed COPD, depression.  Patient was admitted at [facility] between February 10, 2019 and March 02, 2019. Patient was treated for pyelonephritis and retroperitoneal fluid collection concerning for abscess. Urine culture grew MDR E. coli. Patient underwent aspiration of the abscess on 9/17 with JP drain placement. Patient was started on IV imipenem with a PICC line. Summary ofhisactive problems in the hospital is as following.Underwent left percutaneous nephrolithotomy 03/28/2019 and ureteral stent placement at the same time. Underwent laser lithotripsy and basket extraction on 04/05/2019. Patient was recommended to continue the antibiotics until April 16, 2019.  Patient presented to [facility] on 05/11/2019 with shortness of breath left leg pain and fever and nausea and vomiting. CT scan showed evidence of stone on the left side. Patient was initially placed on the BiPAP. His oxygenation worsened and patient actually required intubation in the emergency department. Urology was consulted and patient underwent urgent cystoscopy and left ureteral stent placement. Patient was started on broad-spectrum antibiotics and his blood cultures came back positive for MRSA. Patient was extubated on 05/20/2019 and since then has remained on nasal cannula. Patient's PICC line was removed on 05/12/2019 and a central line was placed which was removed on 05/18/2019. Foley catheter was inserted on 05/11/2019 after the urology procedure and removed on 05/18/2019. TEE showed no evidence of endocarditis but the patient does have evidence of septic emboli with cavitary lesions  on bilateral lung parenchyma therefore patient is treated as infective endocarditis. 05/19/2019 left thoracentesis/pleural fluid culture came back positive for MRSA. Report showed loculated effusion post procedure. 05/20/2019 had pyomyositis underwent right-sided drain placement after incision and debridement. -Due to patient's complaint of back pain MRI thoracic and lumbar spine were performed on 05/21/2019 which showed evidence of epidural abscess as well as facet joint infection. [facility] neurosurgery was consulted who recommended conservative management. Dr. [name] was on call. -Due to worsening shortness of breath CT chest performed on 05/22/2019 showed evidence of recollection of pleural fluid. Chest tube was inserted by PCCM on 05/22/2019. -VATS 12/23 - tolerated well; on Ceftaroline now. Clinically improving.  05/29/19: Seen and examined.  Reports pain at the site of chest tubes. + cough.  Assessment/Plan: Principal Problem:   Empyema of pleural space (HCC) Active Problems:   Schizophrenia (HCC)   Hypothyroidism   Severe sepsis (HCC)   Pyohydronephrosis   Kidney stone   Chronic kidney disease, stage IV (severe) (HCC)   COPD (chronic obstructive pulmonary disease) (HCC)   Atrial flutter (HCC)   MRSA bacteremia   Septic embolism (HCC)   Epidural abscess   ETOH abuse  Severe sepsiswith septic shock, resolved, POA MRSA bacteremia with disseminated infection: likely from previous central line, POA Acute hypoxic respiratory failure due to COPD exacerbation in the setting of Severe sepsis -POA Pyomyositis SP right-sided flank drain placement POA Empyema SP thoracentesis/VATS POA. And now chest tube placement Epidural abscess as well as L4-L5 infection SpO2: 97 % O2 Flow Rate (L/min): 0.5 L/min - Blood cultures came back positive for MRSA; Urine culture came back positive for VRE. -TEE negative for vegetation, but CT chest was positive for evidence of septic emboli therefore patient  is being treated as a possible right-sided infective endocarditis. - TCTS following - VATS  with decortication on 05/15/2019 w/ 2 chest tubes - tolerating well; pain controlled; continue Flutter/IS, PT consulted, 300 cc output from chest tube yesterday Chest tube dc on 12/26 by surgery - Urology was initially consulted as well which currently has signed off as below. - [facility]surgery at [facility] with Dr. [name] aware who recommends conservative management with IV antibiotics as long as the patient does not have any symptoms (remains AOx4) -Infectious disease following, patient currently transitioned to ceftaroline, to finish total of 6 weeks,  So far repeat blood cultures have been negative. Will follow up with Dr. Delaine Lame, [place] outpatient.  Obstructing/infected renal calculus, Concurrent VRE UTI, likely POA SP emergency ureteral stent placement - Cystoscopy on 05/11/2019 and also was found to have a bulbar urethral stricture.  - ID following, patient transitioned to ceftaroline - The stricture was dilated.  - The stent was placed in the left ureter.  - The urine culture on 05/11/2019 was negative.  - A repeat urine culture was sent on 05/16/2019 and that is positive for VRE - Urology no longer following - call back if needed  A. fib/flutter with rapid ventricular rate, resolved - Currently in sinus tach with few PVCs/PACs - Previously on amiodaron drip, for now on lopressor with adequate control - Anticoagulation not recommended due to history of fall - Echo with normal LV function  Anemia of chronic disease Hemoglobin stable at 7.7 from 7.1 No sign of overt bleeding Obtaining iron studies  Acute transaminitis, unclear etiology Worsening alkaline phosphatase AST ALT elevated but trending down. -Acute hepatitis panel  Alcohol withdrawal syndrome, resolved - Monitor for withdrawal - less likely now given timeframe and patient's prolonged hospital stay  Acute on chronic renal failure,  improving Solitary kidney Recent Labs       Lab Results  Component Value Date   CREATININE 2.35 (H) 05/28/2019   CREATININE 2.54 (H) 05/27/2019   CREATININE 2.71 (H) 05/26/2019    - Unknown baseline - Peak creatinine at this stay 3.2 (limited chart review baseline around 2.5 with peak of 5 01/22/19)  Non anion gap metabolic acidosis Chemistry bicarb 19, anion gap 5 Start po sodium bicarb 1300 mg twice daily x2 days. Repeat BMP in the morning  Hypokalemia, likely in the setting of AKI K+ 5.5  IV calcium gluconate 1 g once and 15 g po kayexalate once Repeat BMP am  Paranoid schizophrenia - Resume home medications as soon as feasible  - Patient is a guardian of the state - likely for this reason although have not verified with family/friends  Resolved hypovolemic hypernatremia/ -Sodium 136, 05/29/2019   DVT prophylaxis: Heparin sq TID Code Status: Full Family Communication: None at bedside Disposition Plan: Inpatient, pending further clinical evaluation and improvement.  Consultants:   TCTS  Infectious disease  Neurology  General surgery     Objective: Vitals:   05/28/19 1939 05/28/19 2028 05/28/19 2200 05/29/19 0623  BP: (!) 145/80  125/70 139/79  Pulse: 88 91 82 98  Resp: (!) 24 (!) 32 16 (!) 22  Temp: 98.3 F (36.8 C)   98.2 F (36.8 C)  TempSrc: Oral   Oral  SpO2: 98% 100% 96% 96%  Weight:      Height:        Intake/Output Summary (Last 24 hours) at 05/29/2019 0718 Last data filed at 05/29/2019 0701 Gross per 24 hour  Intake 2540 ml  Output 2120 ml  Net 420 ml   Filed Weights   05/11/2019 2200  Weight: 90.1  kg    Exam:  . General: 63 y.o. year-old male well developed well nourished in no acute distress.  Alert and oriented x3. . Cardiovascular: Regular rate and rhythm with no rubs or gallops.  No thyromegaly or JVD noted.   [name] Respiratory: Mild rales at bases.  No wheezing noted.  Chest tube is in place.   . Abdomen: Soft  nontender nondistended with normal bowel sounds x4 quadrants. . Musculoskeletal: No lower extremity edema.  [name] Psychiatry: Mood is appropriate for condition and setting   Data Reviewed: CBC: Recent Labs  Lab 05/17/2019 0243 05/16/2019 1709 05/26/19 0240 05/27/19 0325 05/28/19 0404 05/29/19 0231  WBC 9.4  --  12.7* 13.8* 10.9* 10.7*  HGB 7.6* 6.8* 8.2* 7.3* 7.2* 7.7*  HCT 25.0* 20.0* 26.4* 23.4* 23.8* 24.9*  MCV 94.7  --  94.3 94.4 96.4 95.0  PLT 400  --  404* 361 328 696   Basic Metabolic Panel: Recent Labs  Lab 05/23/19 0418 05/06/2019 0735 05/24/2019 0243 05/26/2019 1709 05/26/19 0240 05/27/19 0325 05/28/19 0404 05/29/19 0231  NA 139 138 137 140 137 137 138 136  K 5.1 5.2* 5.3* 5.5* 5.7* 5.6* 5.0 5.5*  CL 111 111 111  --  113* 113* 113* 112*  CO2 18* 20* 20*  --  19* 18* 19* 19*  GLUCOSE 94 89 103*  --  91 95 85 82  BUN 57* 54* 50*  --  41* 43* 38* 42*  CREATININE 2.85* 2.51* 2.62*  --  2.71* 2.54* 2.35* 2.34*  CALCIUM 7.9* 8.1* 8.0*  --  8.0* 8.0* 7.8* 8.2*  PHOS 5.1* 4.3  --   --   --   --   --   --    GFR: Estimated Creatinine Clearance: 35 mL/min (A) (by C-G formula based on SCr of 2.34 mg/dL (H)). Liver Function Tests: Recent Labs  Lab 06/01/2019 0243 05/26/19 0240 05/27/19 0325 05/28/19 0404 05/29/19 0231  AST 45* 29 35 84* 64*  ALT 34 26 25 51* 51*  ALKPHOS 228* 161* 170* 252* 267*  BILITOT 1.0 1.0 0.6 0.5 0.6  PROT 5.6* 5.2* 5.2* 5.1* 5.3*  ALBUMIN 1.2* 1.3* 1.2* 1.1* 1.1*   No results for input(s): LIPASE, AMYLASE in the last 168 hours. Recent Labs  Lab 05/22/19 1639  AMMONIA 12   Coagulation Profile: Recent Labs  Lab 05/04/2019 1400  INR 1.1   Cardiac Enzymes: No results for input(s): CKTOTAL, CKMB, CKMBINDEX, TROPONINI in the last 168 hours. BNP (last 3 results) No results for input(s): PROBNP in the last 8760 hours. HbA1C: No results for input(s): HGBA1C in the last 72 hours. CBG: Recent Labs  Lab 05/28/19 0609 05/28/19 1152  05/28/19 1615 05/28/19 2119 05/29/19 0623  GLUCAP 90 95 99 125* 115*   Lipid Profile: No results for input(s): CHOL, HDL, LDLCALC, TRIG, CHOLHDL, LDLDIRECT in the last 72 hours. Thyroid Function Tests: No results for input(s): TSH, T4TOTAL, FREET4, T3FREE, THYROIDAB in the last 72 hours. Anemia Panel: No results for input(s): VITAMINB12, FOLATE, FERRITIN, TIBC, IRON, RETICCTPCT in the last 72 hours. Urine analysis:    Component Value Date/Time   COLORURINE YELLOW 05/26/2019 0245   APPEARANCEUR HAZY (A) 05/26/2019 0245   APPEARANCEUR Cloudy (A) 12/01/2018 0927   LABSPEC 1.012 05/26/2019 0245   LABSPEC 1.010 02/16/2014 1650   PHURINE 5.0 05/26/2019 0245   GLUCOSEU NEGATIVE 05/26/2019 0245   GLUCOSEU Negative 02/16/2014 1650   HGBUR LARGE (A) 05/26/2019 0245   BILIRUBINUR NEGATIVE 05/26/2019 0245  BILIRUBINUR Negative 12/01/2018 0927   BILIRUBINUR Negative 02/16/2014 [employer] 05/26/2019 0245   PROTEINUR 30 (A) 05/26/2019 0245   UROBILINOGEN 0.2 11/07/2012 1049   NITRITE NEGATIVE 05/26/2019 0245   LEUKOCYTESUR LARGE (A) 05/26/2019 0245   LEUKOCYTESUR 3+ 02/16/2014 1650   Sepsis Labs: @LABRCNTIP (procalcitonin:4,lacticidven:4)  ) Recent Results (from the past 240 hour(s))  Body fluid culture     Status: None   Collection Time: 05/19/19 12:02 PM   Specimen: PATH Cytology Pleural fluid  Result Value Ref Range Status   Specimen Description   Final    PLEURAL Performed at [facility], [address], [place], [place] [address]    Special Requests   Final    NONE Performed at [facility], [address] [place], [place] [address]    Gram Stain   Final    ABUNDANT WBC PRESENT,BOTH PMN AND MONONUCLEAR NO ORGANISMS SEEN    Culture   Final    RARE METHICILLIN RESISTANT STAPHYLOCOCCUS AUREUS CRITICAL RESULT CALLED TO, READ BACK BY AND VERIFIED WITH: RN  Dewayne Hatch [id number] Performed at [facility], [address], [place], [place] [address]    Report Status 05/22/2019 FINAL  Final   Organism ID, Bacteria METHICILLIN RESISTANT STAPHYLOCOCCUS AUREUS  Final      Susceptibility   Methicillin resistant staphylococcus aureus - MIC*    CIPROFLOXACIN >=8 RESISTANT Resistant     ERYTHROMYCIN >=8 RESISTANT Resistant     GENTAMICIN <=0.5 SENSITIVE Sensitive     OXACILLIN >=4 RESISTANT Resistant     TETRACYCLINE <=1 SENSITIVE Sensitive     VANCOMYCIN 1 SENSITIVE Sensitive     TRIMETH/SULFA <=10 SENSITIVE Sensitive     CLINDAMYCIN <=0.25 SENSITIVE Sensitive     RIFAMPIN <=0.5 SENSITIVE Sensitive     Inducible Clindamycin NEGATIVE Sensitive     * RARE METHICILLIN RESISTANT STAPHYLOCOCCUS AUREUS  Aerobic/Anaerobic Culture (surgical/deep wound)     Status: None   Collection Time: 05/20/19 10:51 AM   Specimen: PATH Other; Body Fluid  Result Value Ref Range Status   Specimen Description   Final    FLUID Performed at [facility], [address], [place], [place] [address]    Special Requests   Final    RIGHT [facility] Performed at [facility], [address] [place], [place] [address]    Gram Stain   Final    ABUNDANT WBC PRESENT, PREDOMINANTLY PMN RARE GRAM POSITIVE COCCI    Culture   Final    RARE METHICILLIN RESISTANT STAPHYLOCOCCUS AUREUS NO ANAEROBES ISOLATED Performed at [facility], [place] [address], [place], [place] [address]    Report Status 05/28/2019 FINAL  Final   Organism ID, Bacteria METHICILLIN RESISTANT STAPHYLOCOCCUS AUREUS  Final      Susceptibility   Methicillin resistant staphylococcus aureus - MIC*    CIPROFLOXACIN >=8 RESISTANT Resistant     ERYTHROMYCIN >=8 RESISTANT Resistant     GENTAMICIN <=0.5 SENSITIVE Sensitive     OXACILLIN >=4 RESISTANT Resistant     TETRACYCLINE <=1 SENSITIVE Sensitive     VANCOMYCIN 1 SENSITIVE Sensitive     TRIMETH/SULFA <=10 SENSITIVE Sensitive     CLINDAMYCIN <=0.25 SENSITIVE Sensitive     RIFAMPIN <=0.5 SENSITIVE  Sensitive     Inducible Clindamycin NEGATIVE Sensitive     * RARE METHICILLIN RESISTANT STAPHYLOCOCCUS AUREUS  Aerobic/Anaerobic Culture (surgical/deep wound)     Status: None (Preliminary result)   Collection Time: 05/17/2019  3:41 PM   Specimen: PATH Cytology Pleural fluid; Body Fluid  Result Value Ref Range Status   Specimen Description FLUID LEFT PLEURAL  Final   Special Requests PT ON ANCEF  Final   Gram Stain NO WBC SEEN NO ORGANISMS SEEN   Final   Culture   Final    NO GROWTH 3 DAYS NO ANAEROBES ISOLATED; CULTURE IN PROGRESS FOR 5 DAYS Performed at [facility], [address] [place], [place] [address]    Report Status PENDING  Incomplete  Aerobic/Anaerobic Culture (surgical/deep wound)     Status: None (Preliminary result)   Collection Time: 05/17/2019  3:49 PM   Specimen: PATH Cytology Pleural fluid; Body Fluid  Result Value Ref Range Status   Specimen Description FLUID PLEURAL LEFT  Final   Special Requests NO 2 PT ON ANCEF  Final   Gram Stain   Final    RARE WBC PRESENT,BOTH PMN AND MONONUCLEAR NO ORGANISMS SEEN    Culture   Final    NO GROWTH 3 DAYS NO ANAEROBES ISOLATED; CULTURE IN PROGRESS FOR 5 DAYS Performed at [facility], [address], [place], [place] [address]    Report Status PENDING  Incomplete      Studies: DG CHEST PORT 1 VIEW  Result Date: 05/28/2019 CLINICAL DATA:  Empyema. EXAM: PORTABLE CHEST 1 VIEW COMPARISON:  05/27/2019 FINDINGS: Left chest tubes remain in place. Similar peripheral opacity left hemithorax compatible with pleural fluid and/or thickening. Retrocardiac collapse/consolidation is similar. The cardio pericardial silhouette is enlarged. Interstitial markings are diffusely coarsened with chronic features. Telemetry leads overlie the chest. IMPRESSION: Stable exam.  No new or acute interval findings. Electronically Signed   By: Misty Stanley M.D.   On: 05/28/2019 09:42    Scheduled Meds: . sodium chloride   Intravenous  Once  . bisacodyl  10 mg Oral Daily  . Chlorhexidine Gluconate Cloth  6 each Topical Daily  . feeding supplement (ENSURE ENLIVE)  237 mL Oral TID BM  . feeding supplement (PRO-STAT SUGAR FREE 64)  30 mL Oral BID  . insulin aspart  0-24 Units Subcutaneous TID AC & HS  . ipratropium-albuterol  3 mL Nebulization BID  . levothyroxine  50 mcg Oral QAC breakfast  . metoprolol tartrate  25 mg Oral BID  . multivitamin with minerals  1 tablet Oral Daily  . nicotine  21 mg Transdermal Daily  . senna-docusate  1 tablet Oral QHS    Continuous Infusions: . sodium chloride    . sodium chloride 100 mL/hr at 05/28/19 1957  . ceftaroline (TEFLARO) 400 mg IVPB 400 mg (05/29/19 0646)     LOS: 5 days     Kayleen Memos, MD [facility] Pager [phone]  If 7PM-7AM, please contact night-coverage [email or web address] Password [facility] 05/29/2019, 7:18 AM

## 2019-05-29 NOTE — Progress Notes (Signed)
[address]       [place] [address]             [phone]      4 Days Post-Op Procedure(s) (LRB): LEFT VIDEO ASSISTED THORACOSCOPY (VATS)/DECORTICATION; LEFT LOWER LOBE WEDGE RESECTION; DRAINAGE OF EMPYEMA (Left) Subjective: Says he feels better, less SOB, mild diffuse abdominal discomfort, + BM this am  Objective: Vital signs in last 24 hours: Temp:  [98.2 F (36.8 C)-98.6 F (37 C)] 98.2 F (36.8 C) (12/27 0623) Pulse Rate:  [74-98] 98 (12/27 0623) Cardiac Rhythm: Normal sinus rhythm (12/27 0430) Resp:  [16-32] 22 (12/27 0623) BP: (123-158)/(70-82) 139/79 (12/27 0623) SpO2:  [96 %-100 %] 96 % (12/27 0623)  Hemodynamic parameters for last 24 hours:    Intake/Output from previous day: 12/26 0701 - 12/27 0700 In: 2540 [P.O.:1240; I.V.:800; IV Piggyback:500] Out: 6629 [Urine:1325; Drains:15; Chest Tube:200] Intake/Output this shift: Total I/O In: -  Out: 580 [Urine:500; Chest Tube:80]  General appearance: alert, cooperative and no distress Heart: regular rate and rhythm Lungs: mildly dim on left Abdomen: mild tenderness, minor distension, no guarding or rebound Extremities: + pedal edema Wound: dressings intact  Lab Results: Recent Labs    05/28/19 0404 05/29/19 0231  WBC 10.9* 10.7*  HGB 7.2* 7.7*  HCT 23.8* 24.9*  PLT 328 357   BMET:  Recent Labs    05/28/19 0404 05/29/19 0231  NA 138 136  K 5.0 5.5*  CL 113* 112*  CO2 19* 19*  GLUCOSE 85 82  BUN 38* 42*  CREATININE 2.35* 2.34*  CALCIUM 7.8* 8.2*    PT/INR: No results for input(s): LABPROT, INR in the last 72 hours. ABG    Component Value Date/Time   PHART 7.214 (L) 05/15/2019 1709   HCO3 21.7 06/01/2019 1709   TCO2 23 05/03/2019 1709   ACIDBASEDEF 6.0 (H) 05/27/2019 1709   O2SAT 100.0 05/12/2019 1709   CBG (last 3)  Recent Labs    05/28/19 1615 05/28/19 2119 05/29/19 0623  GLUCAP 99 125* 115*    Meds Scheduled Meds: . sodium chloride   Intravenous  Once  . bisacodyl  10 mg Oral Daily  . Chlorhexidine Gluconate Cloth  6 each Topical Daily  . feeding supplement (ENSURE ENLIVE)  237 mL Oral TID BM  . feeding supplement (PRO-STAT SUGAR FREE 64)  30 mL Oral BID  . heparin injection (subcutaneous)  5,000 Units Subcutaneous Q8H  . insulin aspart  0-24 Units Subcutaneous TID AC & HS  . ipratropium-albuterol  3 mL Nebulization BID  . levothyroxine  50 mcg Oral QAC breakfast  . metoprolol tartrate  25 mg Oral BID  . multivitamin with minerals  1 tablet Oral Daily  . nicotine  21 mg Transdermal Daily  . senna-docusate  1 tablet Oral QHS  . sodium bicarbonate  1,300 mg Oral BID  . sodium polystyrene  15 g Oral Once   Continuous Infusions: . sodium chloride    . sodium chloride 100 mL/hr at 05/28/19 1957  . calcium gluconate    . ceftaroline (TEFLARO) 400 mg IVPB 400 mg (05/29/19 0646)   PRN Meds:.Place/Maintain arterial line **AND** sodium chloride, acetaminophen **OR** acetaminophen, fentaNYL (SUBLIMAZE) injection, LORazepam, ondansetron (ZOFRAN) IV, oxyCODONE-acetaminophen, polyethylene glycol  Xrays DG CHEST PORT 1 VIEW  Result Date: 05/28/2019 CLINICAL DATA:  Empyema. EXAM: PORTABLE CHEST 1 VIEW COMPARISON:  05/27/2019 FINDINGS: Left chest tubes remain in place. Similar peripheral opacity left hemithorax compatible with pleural fluid and/or thickening.  Retrocardiac collapse/consolidation is similar. The cardio pericardial silhouette is enlarged. Interstitial markings are diffusely coarsened with chronic features. Telemetry leads overlie the chest. IMPRESSION: Stable exam.  No new or acute interval findings. Electronically Signed   By: Misty Stanley M.D.   On: 05/28/2019 09:42   DG CHEST PORT 1 VIEW  Result Date: 05/27/2019 CLINICAL DATA:  Empyema. EXAM: PORTABLE CHEST 1 VIEW COMPARISON:  06/02/2019 and 05/26/2019 FINDINGS: No significant change in the appearance of the chest since the prior exam. 2 left chest tubes remain in place.  There is persistent peripheral loculated fluid or pleural thickening on the left with atelectasis and or loculated fluid at the left lung base medially. There are hazy areas of nodular infiltrate in the left upper lung zone as demonstrated on the prior CT scan. The small nodular densities in the right lung demonstrated on CT are not apparent on this chest x-ray. The heart size and pulmonary vascularity are normal. Bones are normal. IMPRESSION: 1. No change in the appearance of the chest since the prior study. No pneumothorax. 2. Persistent peripheral loculated fluid or pleural thickening on the left. Electronically Signed   By: Lorriane Shire M.D.   On: 05/27/2019 10:06   Results for orders placed or performed during the hospital encounter of 05/23/2019  Aerobic/Anaerobic Culture (surgical/deep wound)     Status: None (Preliminary result)   Collection Time: 05/29/2019  3:41 PM   Specimen: PATH Cytology Pleural fluid; Body Fluid  Result Value Ref Range Status   Specimen Description FLUID LEFT PLEURAL  Final   Special Requests PT ON ANCEF  Final   Gram Stain NO WBC SEEN NO ORGANISMS SEEN   Final   Culture   Final    NO GROWTH 3 DAYS NO ANAEROBES ISOLATED; CULTURE IN PROGRESS FOR 5 DAYS Performed at [facility], [address], [place], [place] [address]    Report Status PENDING  Incomplete  Aerobic/Anaerobic Culture (surgical/deep wound)     Status: None (Preliminary result)   Collection Time: 05/14/2019  3:49 PM   Specimen: PATH Cytology Pleural fluid; Body Fluid  Result Value Ref Range Status   Specimen Description FLUID PLEURAL LEFT  Final   Special Requests NO 2 PT ON ANCEF  Final   Gram Stain   Final    RARE WBC PRESENT,BOTH PMN AND MONONUCLEAR NO ORGANISMS SEEN    Culture   Final    NO GROWTH 3 DAYS NO ANAEROBES ISOLATED; CULTURE IN PROGRESS FOR 5 DAYS Performed at [facility], [address], [place], [place] [address]    Report Status PENDING  Incomplete    Assessment/Plan: S/P Procedure(s) (LRB): LEFT VIDEO ASSISTED THORACOSCOPY (VATS)/DECORTICATION; LEFT LOWER LOBE WEDGE RESECTION; DRAINAGE OF EMPYEMA (Left)  1 hemodyn stable in sinus rhythm 2 sats good on RA/0.5 liters O2- lung sounds improved 3 CXR not done yet 4 80 cc drainage today, 200 cc yesterday- will leave tube for today and poss remove tomorrow 5 cultures neg so far, no leukocytosis or fevers- conts current abx 6 H/H slightly improved 7 renal fxn stable, trends cont to improve slowly , has chronic insuff. 8 medical management as per hospitalist   LOS: 5 days    John Giovanni [facility] 05/29/2019 Pager [phone]

## 2019-05-29 NOTE — Plan of Care (Signed)
Poc progressing.  

## 2019-05-29 NOTE — Evaluation (Signed)
Physical Therapy Evaluation Patient Details Name: Alan Small MRN: [id number] DOB: 06/22/1955 Today's Date: 05/29/2019   History of Present Illness  Patient is a 63 y/o male who presents with empyema. Chest CT 12/20- showed evidence of recollection of pleural fluid. Chest tube was inserted 05/22/2019. s/p left loculated effusion and VATs 12/23. Multiple admissions at [facility], [facility] recently. PMH includes left staghorn calculus SP stenting, urethral stricture SP urethroplasty, COPD, depression, paranoid schizophrenia, renal failure, CVA, seizures, MI.  Clinical Impression  Patient presents with generalized weakness, dyspnea on exertion, decreased activity tolerance, impaired balance and impaired mobility s/p above. Pt has been in and out of the hospital since September. Pt lives in a group home and has needed assist with bathing and IADLs PTA, needing more assist for mobility as of lately. Today, pt requires Mod A for standing/transfers and Min A for short distance ambulation with RW for safety. Noted to have 2/4 DOE with little activity needing longer rest breaks. Would benefit from SNF to maximize independence and mobility prior to return home. Will follow acutely.    Follow Up Recommendations SNF    Equipment Recommendations  Rolling walker with 5" wheels    Recommendations for Other Services       Precautions / Restrictions Precautions Precautions: Fall Precaution Comments: h/o seizures, chest tube to suction Restrictions Weight Bearing Restrictions: No      Mobility  Bed Mobility Overal bed mobility: Needs Assistance Bed Mobility: Supine to Sit     Supine to sit: Min guard;HOB elevated Sit to supine: Min guard;HOB elevated   General bed mobility comments: Extra time and effort but no physical assistance required this session, use of rails.  Transfers Overall transfer level: Needs assistance Equipment used: Rolling walker (2 wheeled) Transfers: Sit to/from [employer] Sit to Stand: Mod assist;+2 physical assistance Stand pivot transfers: Mod assist;+2 physical assistance       General transfer comment: Assist of 2 to power to standing with cues for technique, SPT bed to [facility] Mod A of 2, able to take a few steps from [facility] to bed with Min A.  Ambulation/Gait Ambulation/Gait assistance: Min assist Gait Distance (Feet): 5 Feet Assistive device: Rolling walker (2 wheeled) Gait Pattern/deviations: Trunk flexed;Decreased step length - right;Decreased step length - left;Shuffle Gait velocity: decreased   General Gait Details: Slow, guarded and effortful steps taken with use of RW, BLE weakness, no buckling today. Fatigues. 2/4 DOE.  Stairs            Wheelchair Mobility    Modified Rankin (Stroke Patients Only)       Balance Overall balance assessment: Needs assistance Sitting-balance support: Feet supported;No upper extremity supported Sitting balance-Leahy Scale: Fair Sitting balance - Comments: Able to scoot bottom along side bed with cues for technique and Min guard assist.   Standing balance support: During functional activity Standing balance-Leahy Scale: Poor Standing balance comment: Min A for standing balance and use of RW, total A for pericare.                             Pertinent Vitals/Pain Pain Assessment: Faces Faces Pain Scale: Hurts little more Pain Location: chest tube site Pain Descriptors / Indicators: Sore;Aching;Guarding;Grimacing Pain Intervention(s): Repositioned;Monitored during session    Home Living Family/patient expects to be discharged to:: Group home   Available Help at Discharge: Personal care attendant;Available PRN/intermittently Type of Home: Group Home Home Access: Ramped entrance  Home Layout: One level Home Equipment: Cane - single point Additional Comments: 1 level home, with ramp access    Prior Function Level of Independence: Needs assistance   Gait / Transfers  Assistance Needed: Reports Mod I with SPC for mobility  ADL's / Homemaking Assistance Needed: Indep with dressing, assistance with bathing, meals, and meds        Hand Dominance   Dominant Hand: Right    Extremity/Trunk Assessment   Upper Extremity Assessment Upper Extremity Assessment: Defer to OT evaluation;Generalized weakness    Lower Extremity Assessment Lower Extremity Assessment: Generalized weakness       Communication   Communication: No difficulties  Cognition Arousal/Alertness: Awake/alert Behavior During Therapy: WFL for tasks assessed/performed Overall Cognitive Status: Within Functional Limits for tasks assessed                                        General Comments General comments (skin integrity, edema, etc.): VSS throughout, not the best 02 reading. 02 on 2-3L/min 02 during session, not the best pleth reading.    Exercises     Assessment/Plan    PT Assessment Patient needs continued PT services  PT Problem List Decreased strength;Decreased activity tolerance;Decreased mobility;Decreased knowledge of use of DME;Decreased balance;Cardiopulmonary status limiting activity;Decreased skin integrity       PT Treatment Interventions Balance training;Gait training;Functional mobility training;Therapeutic activities;DME instruction;Therapeutic exercise;Patient/family education    PT Goals (Current goals can be found in the Care Plan section)  Acute Rehab PT Goals Patient Stated Goal: To get stronger and feel better PT Goal Formulation: With patient Time For Goal Achievement: 07/05/19 Potential to Achieve Goals: Good    Frequency Min 2X/week   Barriers to discharge Inaccessible home environment;Decreased caregiver support      Co-evaluation               AM-PAC PT "6 Clicks" Mobility  Outcome Measure Help needed turning from your back to your side while in a flat bed without using bedrails?: A Little Help needed moving from  lying on your back to sitting on the side of a flat bed without using bedrails?: A Little Help needed moving to and from a bed to a chair (including a wheelchair)?: A Lot Help needed standing up from a chair using your arms (e.g., wheelchair or bedside chair)?: A Lot Help needed to walk in hospital room?: A Little Help needed climbing 3-5 steps with a railing? : Total 6 Click Score: 14    End of Session Equipment Utilized During Treatment: Oxygen;Gait belt Activity Tolerance: Patient limited by fatigue Patient left: in bed;with call bell/phone within reach;with bed alarm set Nurse Communication: Mobility status PT Visit Diagnosis: Difficulty in walking, not elsewhere classified (R26.2);Muscle weakness (generalized) (M62.81);Pain Pain - Right/Left: Left Pain - part of body: (chest tube site)    Time: 1607-3710 PT Time Calculation (min) (ACUTE ONLY): 26 min   Charges:   PT Evaluation $PT Eval Moderate Complexity: 1 Mod PT Treatments $Therapeutic Activity: 8-22 mins        Marisa Severin, PT, DPT Acute Rehabilitation Services Pager [phone] Office [place] 05/29/2019, 1:08 PM

## 2019-05-30 ENCOUNTER — Inpatient Hospital Stay (HOSPITAL_COMMUNITY): Payer: Medicare Other

## 2019-05-30 LAB — AEROBIC/ANAEROBIC CULTURE W GRAM STAIN (SURGICAL/DEEP WOUND)
Culture: NO GROWTH
Culture: NO GROWTH
Gram Stain: NONE SEEN

## 2019-05-30 LAB — COMPREHENSIVE METABOLIC PANEL
ALT: 116 U/L — ABNORMAL HIGH (ref 0–44)
AST: 145 U/L — ABNORMAL HIGH (ref 15–41)
Albumin: 1.1 g/dL — ABNORMAL LOW (ref 3.5–5.0)
Alkaline Phosphatase: 551 U/L — ABNORMAL HIGH (ref 38–126)
Anion gap: 7 (ref 5–15)
BUN: 43 mg/dL — ABNORMAL HIGH (ref 8–23)
CO2: 20 mmol/L — ABNORMAL LOW (ref 22–32)
Calcium: 8.1 mg/dL — ABNORMAL LOW (ref 8.9–10.3)
Chloride: 108 mmol/L (ref 98–111)
Creatinine, Ser: 2.32 mg/dL — ABNORMAL HIGH (ref 0.61–1.24)
GFR calc Af Amer: 33 mL/min — ABNORMAL LOW (ref >60)
GFR calc non Af Amer: 29 mL/min — ABNORMAL LOW (ref >60)
Glucose, Bld: 103 mg/dL — ABNORMAL HIGH (ref 70–99)
Potassium: 4.7 mmol/L (ref 3.5–5.1)
Sodium: 135 mmol/L (ref 135–145)
Total Bilirubin: 0.6 mg/dL (ref 0.3–1.2)
Total Protein: 5.3 g/dL — ABNORMAL LOW (ref 6.5–8.1)

## 2019-05-30 LAB — CBC
HCT: 21.8 % — ABNORMAL LOW (ref 39.0–52.0)
Hemoglobin: 6.7 g/dL — CL (ref 13.0–17.0)
MCH: 29.3 pg (ref 26.0–34.0)
MCHC: 30.7 g/dL (ref 30.0–36.0)
MCV: 95.2 fL (ref 80.0–100.0)
Platelets: 340 10*3/uL (ref 150–400)
RBC: 2.29 MIL/uL — ABNORMAL LOW (ref 4.22–5.81)
RDW: 14.6 % (ref 11.5–15.5)
WBC: 8.1 10*3/uL (ref 4.0–10.5)
nRBC: 0 % (ref 0.0–0.2)

## 2019-05-30 LAB — HEMOGLOBIN AND HEMATOCRIT, BLOOD
HCT: 27.1 % — ABNORMAL LOW (ref 39.0–52.0)
Hemoglobin: 8.5 g/dL — ABNORMAL LOW (ref 13.0–17.0)

## 2019-05-30 LAB — PREPARE RBC (CROSSMATCH)

## 2019-05-30 LAB — GLUCOSE, CAPILLARY
Glucose-Capillary: 116 mg/dL — ABNORMAL HIGH (ref 70–99)
Glucose-Capillary: 100 mg/dL — ABNORMAL HIGH (ref 70–99)
Glucose-Capillary: 135 mg/dL — ABNORMAL HIGH (ref 70–99)
Glucose-Capillary: 105 mg/dL — ABNORMAL HIGH (ref 70–99)

## 2019-05-30 MED ORDER — FUROSEMIDE 10 MG/ML IJ SOLN
20.0000 mg | Freq: Once | INTRAMUSCULAR | Status: AC
  Administered 2019-05-30: 17:00:00 20 mg via INTRAVENOUS
  Filled 2019-05-30: qty 2

## 2019-05-30 MED ORDER — FERROUS SULFATE 325 (65 FE) MG PO TABS
325.0000 mg | ORAL_TABLET | Freq: Every day | ORAL | Status: DC
  Administered 2019-05-31 – 2019-06-07 (×8): 325 mg via ORAL
  Filled 2019-05-30 (×8): qty 1

## 2019-05-30 MED ORDER — SODIUM CHLORIDE 0.9% IV SOLUTION: Freq: Once | INTRAVENOUS | Status: DC

## 2019-05-30 NOTE — Progress Notes (Signed)
Text paged On call [facility] NP again with critical hemoglobin 6.7 , received order for type and cross and I unit blood transfusion. Will continue to monitor.

## 2019-05-30 NOTE — Progress Notes (Addendum)
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 To discharging provider:  please sign these orders via discharge navigator,  Select New Orders & click on the button choice - Manage This Unsigned Work.     Thank you for allowing pharmacy to be a part of this patient's care.  Alan Small 05/30/2019, 8:37 AM

## 2019-05-30 NOTE — Progress Notes (Addendum)
5 Days Post-Op    CC: disseminated MRSA  Subjective: He is sleepy this AM, but no complaints.  Drain RLQ is serous.  Objective: Vital signs in last 24 hours: Temp:  [97.9 F (36.6 C)-98.2 F (36.8 C)] 98.1 F (36.7 C) (12/28 0400) Pulse Rate:  [77-92] 77 (12/28 0400) Resp:  [20-30] 20 (12/28 0400) BP: (139-161)/(80-84) 149/80 (12/28 0400) SpO2:  [99 %-100 %] 100 % (12/28 0746) FiO2 (%):  [2 %] 2 % (12/27 1609) Last BM Date: 05/29/19 240 p.o. recorded 750 IV 1500 urine 45 from the drain Chest tube 160 No BM recorded Afebrile vital signs are stable O2 sats good on 2 L nasal cannula CMP shows creatinine stable at 2.32, alk phos 551, AST 145, ALT 160 Total bilirubin 0.6 WBC 8.1, hemoglobin 6.7, hematocrit 21.8 Intake/Output from previous day: 12/27 0701 - 12/28 0700 In: 990 [P.O.:240; I.V.:500; IV Piggyback:250] Out: 6160 [Urine:1500; Drains:45; Chest Tube:160] Intake/Output this shift: No intake/output data recorded.  General appearance: sleepy but answering questions, no complaints this AM GI: soft, tolerating diet, RLQ drain is serous.   Lab Results:  Recent Labs    05/29/19 0231 05/30/19 0230  WBC 10.7* 8.1  HGB 7.7* 6.7*  HCT 24.9* 21.8*  PLT 357 340    BMET Recent Labs    05/29/19 0231 05/29/19 1802 05/30/19 0230  NA 136  --  135  K 5.5* 4.9 4.7  CL 112*  --  108  CO2 19*  --  20*  GLUCOSE 82  --  103*  BUN 42*  --  43*  CREATININE 2.34*  --  2.32*  CALCIUM 8.2*  --  8.1*   PT/INR No results for input(s): LABPROT, INR in the last 72 hours.  Recent Labs  Lab 05/26/19 0240 05/27/19 0325 05/28/19 0404 05/29/19 0231 05/30/19 0230  AST 29 35 84* 64* 145*  ALT 26 25 51* 51* 116*  ALKPHOS 161* 170* 252* 267* 551*  BILITOT 1.0 0.6 0.5 0.6 0.6  PROT 5.2* 5.2* 5.1* 5.3* 5.3*  ALBUMIN 1.3* 1.2* 1.1* 1.1* 1.1*     Lipase     Component Value Date/Time   LIPASE 16 02/02/2016 1733   LIPASE 111 05/05/2012 1359     Medications: . sodium  chloride   Intravenous Once  . sodium chloride   Intravenous Once  . bisacodyl  10 mg Oral Daily  . Chlorhexidine Gluconate Cloth  6 each Topical Daily  . feeding supplement (ENSURE ENLIVE)  237 mL Oral TID BM  . feeding supplement (PRO-STAT SUGAR FREE 64)  30 mL Oral BID  . heparin injection (subcutaneous)  5,000 Units Subcutaneous Q8H  . insulin aspart  0-24 Units Subcutaneous TID AC & HS  . ipratropium-albuterol  3 mL Nebulization BID  . levothyroxine  50 mcg Oral QAC breakfast  . metoprolol tartrate  25 mg Oral BID  . multivitamin with minerals  1 tablet Oral Daily  . nicotine  21 mg Transdermal Daily  . senna-docusate  1 tablet Oral QHS  . sodium bicarbonate  1,300 mg Oral BID   . sodium chloride    . sodium chloride 50 mL/hr at 05/30/19 0624  . ceftaroline (TEFLARO) 400 mg IVPB 400 mg (05/30/19 0622)   Antibiotics Given (last 72 hours)    Date/Time Action Medication Dose Rate   05/27/19 1516 New Bag/Given   ceftaroline (TEFLARO) 300 mg in sodium chloride 0.9 % 250 mL IVPB 300 mg 125 mL/hr   05/27/19 2126 New Bag/Given  ceftaroline (TEFLARO) 300 mg in sodium chloride 0.9 % 250 mL IVPB 300 mg 125 mL/hr   05/28/19 0600 New Bag/Given   ceftaroline (TEFLARO) 300 mg in sodium chloride 0.9 % 250 mL IVPB 300 mg 125 mL/hr   05/28/19 1408 New Bag/Given   ceftaroline (TEFLARO) 400 mg in sodium chloride 0.9 % 250 mL IVPB 400 mg 125 mL/hr   05/28/19 2210 New Bag/Given   ceftaroline (TEFLARO) 400 mg in sodium chloride 0.9 % 250 mL IVPB 400 mg 125 mL/hr   05/29/19 0646 New Bag/Given   ceftaroline (TEFLARO) 400 mg in sodium chloride 0.9 % 250 mL IVPB 400 mg 125 mL/hr   05/29/19 1433 New Bag/Given   ceftaroline (TEFLARO) 400 mg in sodium chloride 0.9 % 250 mL IVPB 400 mg 125 mL/hr   05/29/19 2155 New Bag/Given   ceftaroline (TEFLARO) 400 mg in sodium chloride 0.9 % 250 mL IVPB 400 mg 125 mL/hr   05/30/19 0622 New Bag/Given   ceftaroline (TEFLARO) 400 mg in sodium chloride 0.9 % 250 mL  IVPB 400 mg 125 mL/hr      Assessment/Plan Pyohydronephrosis due to obstructing calculous A. Fib/flutter EtOH abuse AKI on CKD stage IV; creatinine 2.32 Hypothyroidism Paranoid Schizophrenia Anemia; H/H: 6.7/21.8 -PRBC ordered  Disseminated MRSA Bacteremia- abx per primary  Empyema- s/p left VATS, LLL wedge resection, drainage empyema 12/23 Dr. Orvan Seen. This was the reason for his transfer to [facility] Epidural abscess- per primary, previously discussed with [facility] who recommended conservative management  Right flank myositis with abscess - s/p incision and drainage 12/18 Dr. Christian Mate at [facility] - cx with MRSA, resistant to cipro/oxacillin/erythromycin - drain with 125cc of SS appearing drainage - follow drain output, can remove once output is low and if it remains non-purulent.  - recommend staple removal 06/02/18  FEN: HH diet VTE: SCDs, per primary/ ok for chemical DVT prophylaxis from surgical standpoint ID:  ceftaroline  12/24>> day 5 Follow up: surgeon at [facility] after discharge   Plan:  Drainage volume decreasing.  Remains clear serous fluid.  Will watch and continue for now..        LOS: 6 days    Alan Small 05/30/2019 Please see Amion

## 2019-05-30 NOTE — Progress Notes (Signed)
PROGRESS NOTE  Alan Small DOB: 1956/01/22 DOA: 05/08/2019 PCP: Alan Median, MD  HPI/Recap of past 24 hours: Alan Small a 63 y.o.malewith medical history significant ofsolitary left kidney, CKD stage IV, left staghorn calculus SP stenting, urethral stricture SP urethroplasty, schizophrenia, clinically diagnosed COPD, depression.  Patient was admitted at [facility] between February 10, 2019 and March 02, 2019. Patient was treated for pyelonephritis and retroperitoneal fluid collection concerning for abscess. Urine culture grew MDR E. coli. Patient underwent aspiration of the abscess on 9/17 with JP drain placement. Patient was started on IV imipenem with a PICC line. Summary ofhisactive problems in the hospital is as following.Underwent left percutaneous nephrolithotomy 03/28/2019 and ureteral stent placement at the same time. Underwent laser lithotripsy and basket extraction on 04/05/2019. Patient was recommended to continue the antibiotics until April 16, 2019.  Patient presented to [facility] on 05/11/2019 with shortness of breath left leg pain and fever and nausea and vomiting. CT scan showed evidence of stone on the left side. Patient was initially placed on the BiPAP. His oxygenation worsened and patient actually required intubation in the emergency department. Urology was consulted and patient underwent urgent cystoscopy and left ureteral stent placement. Patient was started on broad-spectrum antibiotics and his blood cultures came back positive for MRSA. Patient was extubated on 05/20/2019 and since then has remained on nasal cannula. Patient's PICC line was removed on 05/12/2019 and a central line was placed which was removed on 05/18/2019. Foley catheter was inserted on 05/11/2019 after the urology procedure and removed on 05/18/2019. TEE showed no evidence of endocarditis but the patient does have evidence of septic emboli with cavitary lesions  on bilateral lung parenchyma therefore patient is treated as infective endocarditis. 05/19/2019 left thoracentesis/pleural fluid culture came back positive for MRSA. Report showed loculated effusion post procedure. 05/20/2019 had pyomyositis underwent right-sided drain placement after incision and debridement. -Due to patient's complaint of back pain MRI thoracic and lumbar spine were performed on 05/21/2019 which showed evidence of epidural abscess as well as facet joint infection. [facility] neurosurgery was consulted who recommended conservative management. Dr. [name] was on call. -Due to worsening shortness of breath CT chest performed on 05/22/2019 showed evidence of recollection of pleural fluid. Chest tube was inserted by PCCM on 05/22/2019. -VATS 12/23 - tolerated well; on Ceftaroline now. Clinically improving.  05/30/19: Seen and examined.  No new complaints.  Diffused edema noted, hold IV fluid.  Drop in hemoglobin, denies melena or hematochezia.  Transfusing 1 unit PRBC.    Assessment/Plan: Principal Problem:   Empyema of pleural space (HCC) Active Problems:   Schizophrenia (HCC)   Hypothyroidism   Severe sepsis (HCC)   Pyohydronephrosis   Kidney stone   Chronic kidney disease, stage IV (severe) (HCC)   COPD (chronic obstructive pulmonary disease) (HCC)   Atrial flutter (HCC)   MRSA bacteremia   Septic embolism (HCC)   Epidural abscess   ETOH abuse  Severe sepsiswith septic shock secondary to disseminated MRSA infection thought to have stemmed from a tunneled PICC line that was not maintained/removed following 8-week course of IV antibiotics, septic shock physiology is resolved, POA Pyomyositis SP right-sided flank drain placement POA Empyema SP thoracentesis/VATS POA. And now chest tube placement Epidural abscess as well as L4-L5 infection - Blood cultures came back positive for MRSA; Urine culture came back positive for VRE. -TEE negative for vegetation, but CT chest was  positive for evidence of septic emboli therefore patient is being treated as a possible  right-sided infective endocarditis. - TCTS following - VATS with decortication on 05/05/2019 w/ 2 chest tubes - tolerating well; pain controlled; continue Flutter/IS, PT consulted - Urology was initially consulted as well which currently has signed off as below. - Neurosurgery at [facility] with Dr. [name] aware who recommends conservative management with IV antibiotics as long as the patient does not have any symptoms (remains AOx4) -Infectious disease following, patient currently transitioned to ceftaroline, to finish total of 6 weeks,  So far repeat blood cultures 12/23/negative to date. Will follow up with Dr. Delaine Lame, [facility] outpatient.  Obstructing/infected renal calculus, Concurrent VRE UTI, likely POA SP emergency ureteral stent placement - Cystoscopy on 05/11/2019 and also was found to have a bulbar urethral stricture.  - ID following, patient currently on ceftaroline per ID - The stricture was dilated.  - The stent was placed in the left ureter.  - The urine culture on 05/11/2019 was negative. A repeat urine culture was sent on 05/16/2019 and that is positive for VRE - Urology no longer following - call back if needed  A. fib/flutter with rapid ventricular rate, resolved Rate is currently controlled on p.o. Lopressor 25 mg twice daily - Anticoagulation not recommended due to history of fall - Echo with normal LV function Continue to monitor on telemetry  Acute hypoxic respiratory failure likely in the setting of empyema, improving O2 saturation 100% on 2 L Continue pulmonary toilet and IV antibiotics Monitor output from chest tube  Acute blood loss anemia/iron deficiency anemia  -Hemoglobin 6.7, transfuse 1 unit 05/30/2019 -Iron studies suggestive of iron deficiency Start ferrous sulfate  Worsening acute transaminitis Acute hepatitis panel negative CT abdomen and pelvis done on 05/18/2019  without contrast: Multiple low-density lesions again seen within the liver, unchanged, likely cysts. Large exophytic cyst off the left hepatic lobe is stable. Gallbladder unremarkable. Worsening alkaline phosphatase, AST and ALT  Alcohol withdrawal syndrome, resolved - Monitor for withdrawal - less likely now given timeframe and patient's prolonged hospital stay  Acute on chronic renal failure, improving Solitary kidney Recent Labs       Lab Results  Component Value Date   CREATININE 2.35 (H) 05/28/2019   CREATININE 2.54 (H) 05/27/2019   CREATININE 2.71 (H) 05/26/2019    - Unknown baseline - Peak creatinine at this stay 3.2 (limited chart review baseline around 2.5 with peak of 5 01/22/19) Creatinine trending down Urine output 1.5 L recorded in the last 24 hours  Non anion gap metabolic acidosis Chemistry bicarb 19, anion gap 5>> improving Continue po sodium bicarb 1300 mg twice daily x2 days. Repeat BMP in the morning  Resolved hyperkalemia, likely in the setting of AKI Treated with IV calcium gluconate 1 g once and 15 g po kayexalate once and 1 dose of Lokelma. Continue daily BMPs  Paranoid schizophrenia/chronic anxiety Stable  Resolved hypovolemic hypernatremia/   DVT prophylaxis: Heparin sq TID Code Status: Full Family Communication: None at bedside  Disposition Plan:  Will plan to DC once cardiology and general surgery sign off and patient is hemodynamically stable.  Consultants:   TCTS  Infectious disease  Neurology  General surgery     Objective: Vitals:   05/29/19 2120 05/30/19 0400 05/30/19 0746 05/30/19 0953  BP:  (!) 149/80  (!) 145/81  Pulse: 80 77  85  Resp: (!) 30 20  (!) 21  Temp:  98.1 F (36.7 C)  98.4 F (36.9 C)  TempSrc:  Oral  Oral  SpO2:  100% 100% 100%  Weight:  Height:        Intake/Output Summary (Last 24 hours) at 05/30/2019 1012 Last data filed at 05/30/2019 4315 Gross per 24 hour  Intake 870 ml    Output 1125 ml  Net -255 ml   Filed Weights   05/12/2019 2200  Weight: 90.1 kg    Exam:  . General: 63 y.o. year-old male well-developed well-nourished no acute distress.  Alert and oriented x3.   . Cardiovascular: Regular rate and rhythm no rubs or gallops.   [name] Respiratory: Mild rales at bases.  No wheezing noted.  Chest tube in place. . Abdomen: Soft normal bowel sounds present. . Musculoskeletal: Diffuse edema noted.  [name]  Psychiatry: Mood is appropriate for condition and setting.   Data Reviewed: CBC: Recent Labs  Lab 05/26/19 0240 05/27/19 0325 05/28/19 0404 05/29/19 0231 05/30/19 0230  WBC 12.7* 13.8* 10.9* 10.7* 8.1  HGB 8.2* 7.3* 7.2* 7.7* 6.7*  HCT 26.4* 23.4* 23.8* 24.9* 21.8*  MCV 94.3 94.4 96.4 95.0 95.2  PLT 404* 361 328 357 400   Basic Metabolic Panel: Recent Labs  Lab 05/15/2019 0735 05/26/19 0240 05/27/19 0325 05/28/19 0404 05/29/19 0231 05/29/19 1802 05/30/19 0230  NA 138 137 137 138 136  --  135  K 5.2* 5.7* 5.6* 5.0 5.5* 4.9 4.7  CL 111 113* 113* 113* 112*  --  108  CO2 20* 19* 18* 19* 19*  --  20*  GLUCOSE 89 91 95 85 82  --  103*  BUN 54* 41* 43* 38* 42*  --  43*  CREATININE 2.51* 2.71* 2.54* 2.35* 2.34*  --  2.32*  CALCIUM 8.1* 8.0* 8.0* 7.8* 8.2*  --  8.1*  PHOS 4.3  --   --   --   --   --   --    GFR: Estimated Creatinine Clearance: 35.3 mL/min (A) (by C-G formula based on SCr of 2.32 mg/dL (H)). Liver Function Tests: Recent Labs  Lab 05/26/19 0240 05/27/19 0325 05/28/19 0404 05/29/19 0231 05/30/19 0230  AST 29 35 84* 64* 145*  ALT 26 25 51* 51* 116*  ALKPHOS 161* 170* 252* 267* 551*  BILITOT 1.0 0.6 0.5 0.6 0.6  PROT 5.2* 5.2* 5.1* 5.3* 5.3*  ALBUMIN 1.3* 1.2* 1.1* 1.1* 1.1*   No results for input(s): LIPASE, AMYLASE in the last 168 hours. No results for input(s): AMMONIA in the last 168 hours. Coagulation Profile: Recent Labs  Lab 05/04/2019 1400  INR 1.1   Cardiac Enzymes: No results for input(s): CKTOTAL, CKMB,  CKMBINDEX, TROPONINI in the last 168 hours. BNP (last 3 results) No results for input(s): PROBNP in the last 8760 hours. HbA1C: No results for input(s): HGBA1C in the last 72 hours. CBG: Recent Labs  Lab 05/29/19 0623 05/29/19 1142 05/29/19 1607 05/29/19 2114 05/30/19 0617  GLUCAP 115* 111* 140* 78 116*   Lipid Profile: No results for input(s): CHOL, HDL, LDLCALC, TRIG, CHOLHDL, LDLDIRECT in the last 72 hours. Thyroid Function Tests: No results for input(s): TSH, T4TOTAL, FREET4, T3FREE, THYROIDAB in the last 72 hours. Anemia Panel: Recent Labs    05/29/19 0839  VITAMINB12 704  FERRITIN 406*  TIBC 132*  IRON 12*   Urine analysis:    Component Value Date/Time   COLORURINE YELLOW 05/26/2019 0245   APPEARANCEUR HAZY (A) 05/26/2019 0245   APPEARANCEUR Cloudy (A) 12/01/2018 0927   LABSPEC 1.012 05/26/2019 0245   LABSPEC 1.010 02/16/2014 1650   PHURINE 5.0 05/26/2019 0245   GLUCOSEU NEGATIVE 05/26/2019 0245   GLUCOSEU  Negative 02/16/2014 1650   HGBUR LARGE (A) 05/26/2019 0245   BILIRUBINUR NEGATIVE 05/26/2019 0245   BILIRUBINUR Negative 12/01/2018 0927   BILIRUBINUR Negative 02/16/2014 1650   KETONESUR NEGATIVE 05/26/2019 0245   PROTEINUR 30 (A) 05/26/2019 0245   UROBILINOGEN 0.2 11/07/2012 1049   NITRITE NEGATIVE 05/26/2019 0245   LEUKOCYTESUR LARGE (A) 05/26/2019 0245   LEUKOCYTESUR 3+ 02/16/2014 1650   Sepsis Labs: @LABRCNTIP (procalcitonin:4,lacticidven:4)  ) Recent Results (from the past 240 hour(s))  Aerobic/Anaerobic Culture (surgical/deep wound)     Status: None   Collection Time: 05/20/19 10:51 AM   Specimen: PATH Other; Body Fluid  Result Value Ref Range Status   Specimen Description   Final    FLUID Performed at [facility], [address], [place], [place] [address]    Special Requests   Final    RIGHT [facility] Performed at [facility], [address] [place], [place] [address]    Gram Stain   Final    ABUNDANT WBC PRESENT,  PREDOMINANTLY PMN RARE GRAM POSITIVE COCCI    Culture   Final    RARE METHICILLIN RESISTANT STAPHYLOCOCCUS AUREUS NO ANAEROBES ISOLATED Performed at [facility], [place] [address], [place], [place] [address]    Report Status 05/18/2019 FINAL  Final   Organism ID, Bacteria METHICILLIN RESISTANT STAPHYLOCOCCUS AUREUS  Final      Susceptibility   Methicillin resistant staphylococcus aureus - MIC*    CIPROFLOXACIN >=8 RESISTANT Resistant     ERYTHROMYCIN >=8 RESISTANT Resistant     GENTAMICIN <=0.5 SENSITIVE Sensitive     OXACILLIN >=4 RESISTANT Resistant     TETRACYCLINE <=1 SENSITIVE Sensitive     VANCOMYCIN 1 SENSITIVE Sensitive     TRIMETH/SULFA <=10 SENSITIVE Sensitive     CLINDAMYCIN <=0.25 SENSITIVE Sensitive     RIFAMPIN <=0.5 SENSITIVE Sensitive     Inducible Clindamycin NEGATIVE Sensitive     * RARE METHICILLIN RESISTANT STAPHYLOCOCCUS AUREUS  Aerobic/Anaerobic Culture (surgical/deep wound)     Status: None (Preliminary result)   Collection Time: 05/13/2019  3:41 PM   Specimen: PATH Cytology Pleural fluid; Body Fluid  Result Value Ref Range Status   Specimen Description FLUID LEFT PLEURAL  Final   Special Requests PT ON ANCEF  Final   Gram Stain NO WBC SEEN NO ORGANISMS SEEN   Final   Culture   Final    NO GROWTH 4 DAYS NO ANAEROBES ISOLATED; CULTURE IN PROGRESS FOR 5 DAYS Performed at [facility], [address], [place], [place] [address]    Report Status PENDING  Incomplete  Aerobic/Anaerobic Culture (surgical/deep wound)     Status: None (Preliminary result)   Collection Time: 05/05/2019  3:49 PM   Specimen: PATH Cytology Pleural fluid; Body Fluid  Result Value Ref Range Status   Specimen Description FLUID PLEURAL LEFT  Final   Special Requests NO 2 PT ON ANCEF  Final   Gram Stain   Final    RARE WBC PRESENT,BOTH PMN AND MONONUCLEAR NO ORGANISMS SEEN    Culture   Final    NO GROWTH 4 DAYS NO ANAEROBES ISOLATED; CULTURE IN PROGRESS FOR 5 DAYS Performed  at [facility], [address], [place], [place] [address]    Report Status PENDING  Incomplete      Studies: DG CHEST PORT 1 VIEW  Result Date: 05/30/2019 CLINICAL DATA:  Chest tube. EXAM: PORTABLE CHEST 1 VIEW COMPARISON:  May 29, 2019. FINDINGS: Stable cardiomediastinal silhouette. Left-sided chest  tube is noted and unchanged in position. No definite pneumothorax is noted. Right lung is clear. Stable left basilar atelectasis and possible small pleural effusion is noted. Bony thorax is unremarkable. IMPRESSION: Left-sided chest tube is unchanged in position. No definite pneumothorax is noted. Stable left basilar atelectasis and possible small pleural effusion. Electronically Signed   By: Marijo Conception M.D.   On: 05/30/2019 07:09    Scheduled Meds: . sodium chloride   Intravenous Once  . sodium chloride   Intravenous Once  . bisacodyl  10 mg Oral Daily  . Chlorhexidine Gluconate Cloth  6 each Topical Daily  . feeding supplement (ENSURE ENLIVE)  237 mL Oral TID BM  . feeding supplement (PRO-STAT SUGAR FREE 64)  30 mL Oral BID  . heparin injection (subcutaneous)  5,000 Units Subcutaneous Q8H  . insulin aspart  0-24 Units Subcutaneous TID AC & HS  . ipratropium-albuterol  3 mL Nebulization BID  . levothyroxine  50 mcg Oral QAC breakfast  . metoprolol tartrate  25 mg Oral BID  . multivitamin with minerals  1 tablet Oral Daily  . nicotine  21 mg Transdermal Daily  . senna-docusate  1 tablet Oral QHS  . sodium bicarbonate  1,300 mg Oral BID    Continuous Infusions: . sodium chloride    . sodium chloride 50 mL/hr at 05/30/19 0624  . ceftaroline (TEFLARO) 400 mg IVPB 400 mg (05/30/19 0622)     LOS: 6 days     Kayleen Memos, MD [facility] Pager [phone]  If 7PM-7AM, please contact night-coverage [email or web address] Password [email or web address] 05/30/2019, 10:12 AM

## 2019-05-30 NOTE — Progress Notes (Signed)
      [address]       [place] [address]             [phone]      5 Days Post-Op Procedure(s) (LRB): LEFT VIDEO ASSISTED THORACOSCOPY (VATS)/DECORTICATION; LEFT LOWER LOBE WEDGE RESECTION; DRAINAGE OF EMPYEMA (Left) Subjective: Feels okay this morning. Pain is well controlled.  Objective: Vital signs in last 24 hours: Temp:  [97.9 F (36.6 C)-98.2 F (36.8 C)] 98.1 F (36.7 C) (12/28 0400) Pulse Rate:  [77-92] 77 (12/28 0400) Cardiac Rhythm: Normal sinus rhythm;Sinus tachycardia (12/28 0400) Resp:  [20-30] 20 (12/28 0400) BP: (139-161)/(80-84) 149/80 (12/28 0400) SpO2:  [99 %-100 %] 100 % (12/28 0746) FiO2 (%):  [2 %] 2 % (12/27 1609)     Intake/Output from previous day: 12/27 0701 - 12/28 0700 In: 990 [P.O.:240; I.V.:500; IV Piggyback:250] Out: 3532 [Urine:1500; Drains:45; Chest Tube:160] Intake/Output this shift: No intake/output data recorded.  General appearance: alert, cooperative and no distress Heart: regular rate and rhythm, S1, S2 normal, no murmur, click, rub or gallop Lungs: clear to auscultation bilaterally Abdomen: soft, non-tender; bowel sounds normal; no masses,  no organomegaly Extremities: edema in upper and lower extremity Wound: clean and dry  Lab Results: Recent Labs    05/29/19 0231 05/30/19 0230  WBC 10.7* 8.1  HGB 7.7* 6.7*  HCT 24.9* 21.8*  PLT 357 340   BMET:  Recent Labs    05/29/19 0231 05/29/19 1802 05/30/19 0230  NA 136  --  135  K 5.5* 4.9 4.7  CL 112*  --  108  CO2 19*  --  20*  GLUCOSE 82  --  103*  BUN 42*  --  43*  CREATININE 2.34*  --  2.32*  CALCIUM 8.2*  --  8.1*    PT/INR: No results for input(s): LABPROT, INR in the last 72 hours. ABG    Component Value Date/Time   PHART 7.214 (L) 05/30/2019 1709   HCO3 21.7 05/05/2019 1709   TCO2 23 05/31/2019 1709   ACIDBASEDEF 6.0 (H) 05/29/2019 1709   O2SAT 100.0 05/28/2019 1709   CBG (last 3)  Recent Labs    05/29/19 1607 05/29/19 2114  05/30/19 0617  GLUCAP 140* 78 116*    Assessment/Plan: S/P Procedure(s) (LRB): LEFT VIDEO ASSISTED THORACOSCOPY (VATS)/DECORTICATION; LEFT LOWER LOBE WEDGE RESECTION; DRAINAGE OF EMPYEMA (Left)  1 hemodyn stable in sinus rhythm, BP stable 2 sats good on RA  lung sounds improved 3 CXR showed:  Left-sided chest tube is unchanged in position. No definite pneumothorax is noted. Stable left basilar atelectasis and possible small pleural effusion. 4 160cc out of chest tube, chest tube was kinked. Continue chest tube.  5 cultures neg so far x 4 days, no leukocytosis or fevers- conts current abx 6 H/H 6.7/21.8-Transfuse 1 unit 7 renal fxn stable, trends cont to improve slowly , has chronic insuff.  8 medical management as per hospitalist  Plan: continue chest tube for now. It was kinked so drainage likely inaccurate. Continue medical management per hospitalist team.    LOS: 6 days    Elgie Collard 05/30/2019

## 2019-05-30 NOTE — Plan of Care (Signed)
Continue to monitor

## 2019-05-30 NOTE — Progress Notes (Signed)
Pt's hemoglobin this am 6.7. Text paged on call [facility] NP K  Baltazar Najjar. awaiting orders.

## 2019-05-31 ENCOUNTER — Inpatient Hospital Stay (HOSPITAL_COMMUNITY): Payer: Medicare Other

## 2019-05-31 LAB — COMPREHENSIVE METABOLIC PANEL
ALT: 68 U/L — ABNORMAL HIGH (ref 0–44)
AST: 47 U/L — ABNORMAL HIGH (ref 15–41)
Albumin: 1.2 g/dL — ABNORMAL LOW (ref 3.5–5.0)
Alkaline Phosphatase: 384 U/L — ABNORMAL HIGH (ref 38–126)
Anion gap: 8 (ref 5–15)
BUN: 44 mg/dL — ABNORMAL HIGH (ref 8–23)
CO2: 19 mmol/L — ABNORMAL LOW (ref 22–32)
Calcium: 8.2 mg/dL — ABNORMAL LOW (ref 8.9–10.3)
Chloride: 110 mmol/L (ref 98–111)
Creatinine, Ser: 2.69 mg/dL — ABNORMAL HIGH (ref 0.61–1.24)
GFR calc Af Amer: 28 mL/min — ABNORMAL LOW (ref >60)
GFR calc non Af Amer: 24 mL/min — ABNORMAL LOW (ref >60)
Glucose, Bld: 103 mg/dL — ABNORMAL HIGH (ref 70–99)
Potassium: 5 mmol/L (ref 3.5–5.1)
Sodium: 137 mmol/L (ref 135–145)
Total Bilirubin: 0.3 mg/dL (ref 0.3–1.2)
Total Protein: 5.6 g/dL — ABNORMAL LOW (ref 6.5–8.1)

## 2019-05-31 LAB — TYPE AND SCREEN
ABO/RH(D): O POS
Antibody Screen: NEGATIVE
Unit division: 0

## 2019-05-31 LAB — CBC
HCT: 25.2 % — ABNORMAL LOW (ref 39.0–52.0)
Hemoglobin: 8.1 g/dL — ABNORMAL LOW (ref 13.0–17.0)
MCH: 29.8 pg (ref 26.0–34.0)
MCHC: 32.1 g/dL (ref 30.0–36.0)
MCV: 92.6 fL (ref 80.0–100.0)
Platelets: 283 10*3/uL (ref 150–400)
RBC: 2.72 MIL/uL — ABNORMAL LOW (ref 4.22–5.81)
RDW: 15 % (ref 11.5–15.5)
WBC: 7.8 10*3/uL (ref 4.0–10.5)
nRBC: 0 % (ref 0.0–0.2)

## 2019-05-31 LAB — BPAM RBC
Blood Product Expiration Date: 202101242359
ISSUE DATE / TIME: 202012280947
Unit Type and Rh: 5100

## 2019-05-31 LAB — GLUCOSE, CAPILLARY
Glucose-Capillary: 85 mg/dL (ref 70–99)
Glucose-Capillary: 130 mg/dL — ABNORMAL HIGH (ref 70–99)
Glucose-Capillary: 83 mg/dL (ref 70–99)
Glucose-Capillary: 114 mg/dL — ABNORMAL HIGH (ref 70–99)

## 2019-05-31 LAB — FOLATE RBC
Folate, Hemolysate: 265 ng/mL
Folate, RBC: 1274 ng/mL (ref >498)
Hematocrit: 20.8 % — ABNORMAL LOW (ref 37.5–51.0)

## 2019-05-31 MED ORDER — IPRATROPIUM-ALBUTEROL 0.5-2.5 (3) MG/3ML IN SOLN
3.0000 mL | Freq: Three times a day (TID) | RESPIRATORY_TRACT | Status: DC | PRN
  Administered 2019-05-31 – 2019-06-05 (×3): 3 mL via RESPIRATORY_TRACT
  Filled 2019-05-31 (×2): qty 3

## 2019-05-31 NOTE — NC FL2 (Signed)
[place] MEDICAID FL2 LEVEL OF CARE SCREENING TOOL     IDENTIFICATION  Patient Name: Alan Small Birthdate: [birth year] Sex: male Admission Date (Current Location): 05/26/2019  [facility] and [place] Number:  Herbalist and Address:  [facility] [place], [place] [address] [place], [place] [address]      Provider Number: [id number]  Attending Physician Name and Address:  Kayleen Memos, DO  Relative Name and Phone Number:  [name] (DSS) Legal Guardian [phone]    Current Level of Care: Hospital Recommended Level of Care: [place] Prior Approval Number:    Date Approved/Denied:   PASRR Number: Under Review  Discharge Plan: SNF    Current Diagnoses: Patient Active Problem List   Diagnosis Date Noted  . Pyomyositis 05/12/2019  . Empyema of pleural space ([place]) 05/20/2019  . Septic embolism ([place]) 05/27/2019  . 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 ETOH abuse 05/23/2019  . Atrial flutter ([place])   . MRSA bacteremia   . Community acquired pneumonia 01/20/2019  . Gastroenteritis 01/20/2019  . Blood in stool   . Change in bowel habits   . Constipation   . Polyp of sigmoid colon   . Depression 02/26/2016  . COPD (chronic obstructive pulmonary disease) ([place]) 02/03/2016  . Suprapubic catheter ([place]) 12/17/2015  . Urethral stricture 12/17/2015  . Urinary retention 10/27/2015  . Tobacco use disorder 08/27/2015  . Malnutrition of moderate degree 05/09/2015  . Sepsis ([place]) 05/06/2015  . Pyohydronephrosis 01/17/2015  . Hypoglycemia 01/17/2015  . Gastroesophageal reflux disease 10/28/2014  . Bright red rectal bleeding 10/26/2014  . Chronic constipation 10/26/2014  . Hydronephrosis of left kidney 01/17/2014  . Neurogenic bladder 06/21/2013  . Urolithiasis 03/15/2013  . Nephrolithiasis 03/04/2013  . Metabolic acidosis 78/67/5449  . Acute renal failure ([place]) 11/03/2012  . Hyperkalemia 11/03/2012  . Pyelonephritis 11/03/2012  .  Schizophrenia ([place]) 11/03/2012  . Hypothyroidism 11/03/2012  . Severe sepsis ([place]) 11/03/2012  . Protein-calorie malnutrition, severe ([place]) 11/03/2012  . Encephalopathy acute 11/03/2012  . Chronic kidney disease, stage IV (severe) ([place]) 07/02/2012  . 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 Kidney stone 04/13/2012    Orientation RESPIRATION BLADDER Height & Weight     Self, Time, Situation, Place  O2(2L Nasal Cannula) External catheter, Continent Weight: 198 lb 10.2 oz (90.1 kg) Height:  6' (182.9 cm)  BEHAVIORAL SYMPTOMS/MOOD NEUROLOGICAL BOWEL NUTRITION STATUS      Continent Diet(please see discharge summary)  AMBULATORY STATUS COMMUNICATION OF NEEDS Skin     Verbally Skin abrasions, Surgical wounds(incision(closed) chest.left clean,intact dry, incision(closed) flank right , gauze)                       Personal Care Assistance Level of Assistance  Bathing, Feeding, Dressing Bathing Assistance: Limited assistance Feeding assistance: Limited assistance Dressing Assistance: Limited assistance     Functional Limitations Info  Sight, Hearing, Speech Sight Info: Adequate Hearing Info: Adequate Speech Info: Adequate    SPECIAL CARE FACTORS FREQUENCY  PT (By licensed PT), OT (By licensed OT)     PT Frequency: 5x per week OT Frequency: 5x per week            Contractures Contractures Info: Not present    Additional Factors Info  Code Status, Allergies, Psychotropic Code Status Info: FULL Allergies Info: Mellaril ,Buprenorphine,Morphine And Related,Navane,Thorazine Psychotropic Info: LORazepam (ATIVAN) injection 0.5 mg         Current Medications (05/31/2019):  This is the current  hospital active medication list Current Facility-Administered Medications  Medication Dose Route Frequency Provider Last Rate Last Admin  . 0.9 %  sodium chloride infusion   Intra-arterial PRN Roddenberry, Myron G, PA-C      . acetaminophen (TYLENOL) tablet 650 mg  650 mg Oral Q6H PRN  Roddenberry, Myron G, PA-C       Or  . acetaminophen (TYLENOL) suppository 650 mg  650 mg Rectal Q6H PRN Roddenberry, Myron G, PA-C      . bisacodyl (DULCOLAX) EC tablet 10 mg  10 mg Oral Daily Roddenberry, Myron G, PA-C   10 mg at 05/30/19 1022  . ceftaroline (TEFLARO) 400 mg in sodium chloride 0.9 % 250 mL IVPB  400 mg Intravenous Q8H Elgergawy, Silver Huguenin, MD 125 mL/hr at 05/31/19 1449 400 mg at 05/31/19 1449  . Chlorhexidine Gluconate Cloth 2 % PADS 6 each  6 each Topical Daily Little Ishikawa, MD   6 each at 05/31/19 1102  . feeding supplement (ENSURE ENLIVE) (ENSURE ENLIVE) liquid 237 mL  237 mL Oral TID BM Roddenberry, Myron G, PA-C   237 mL at 05/30/19 1022  . feeding supplement (PRO-STAT SUGAR FREE 64) liquid 30 mL  30 mL Oral BID Elgergawy, Silver Huguenin, MD   30 mL at 05/31/19 1100  . fentaNYL (SUBLIMAZE) injection 25-50 mcg  25-50 mcg Intravenous Q2H PRN Roddenberry, Myron G, PA-C      . ferrous sulfate tablet 325 mg  325 mg Oral Q breakfast Irene Pap N, DO   325 mg at 05/31/19 1100  . heparin injection 5,000 Units  5,000 Units Subcutaneous Q8H Irene Pap N, DO   5,000 Units at 05/31/19 1448  . insulin aspart (novoLOG) injection 0-24 Units  0-24 Units Subcutaneous TID AC & HS Antony Odea, PA-C   2 Units at 05/31/19 1234  . ipratropium-albuterol (DUONEB) 0.5-2.5 (3) MG/3ML nebulizer solution 3 mL  3 mL Nebulization BID Elgergawy, Silver Huguenin, MD   3 mL at 05/31/19 0917  . ipratropium-albuterol (DUONEB) 0.5-2.5 (3) MG/3ML nebulizer solution 3 mL  3 mL Nebulization Q8H PRN Conte, Tessa N, PA-C      . levothyroxine (SYNTHROID) tablet 50 mcg  50 mcg Oral QAC breakfast Antony Odea, PA-C   50 mcg at 05/31/19 0516  . LORazepam (ATIVAN) injection 0.5 mg  0.5 mg Intravenous Q6H PRN Roddenberry, Myron G, PA-C      . metoprolol tartrate (LOPRESSOR) tablet 25 mg  25 mg Oral BID Roddenberry, Myron G, PA-C   25 mg at 05/31/19 1101  . multivitamin with minerals tablet 1 tablet  1  tablet Oral Daily Antony Odea, PA-C   1 tablet at 05/31/19 1101  . nicotine (NICODERM CQ - dosed in mg/24 hours) patch 21 mg  21 mg Transdermal Daily Roddenberry, Myron G, PA-C      . ondansetron (ZOFRAN) injection 4 mg  4 mg Intravenous Q6H PRN Roddenberry, Myron G, PA-C      . oxyCODONE-acetaminophen (PERCOCET/ROXICET) 5-325 MG per tablet 1 tablet  1 tablet Oral Q4H PRN Antony Odea, PA-C   1 tablet at 05/31/19 1450  . polyethylene glycol (MIRALAX / GLYCOLAX) packet 17 g  17 g Oral Daily PRN Meuth, Brooke A, PA-C      . senna-docusate (Senokot-S) tablet 1 tablet  1 tablet Oral QHS Antony Odea, PA-C   1 tablet at 05/28/19 2207     Discharge Medications: Please see discharge summary for a list of discharge medications.  Relevant  Imaging Results:  Relevant Lab Results:   Additional Information SSN [id number]         DSS Legal Guardian [name] [phone] patient was with [facility] 09/30-11/14(45 days).  Vinie Sill, LCSWA

## 2019-05-31 NOTE — Progress Notes (Signed)
CSW unable to complete assessment- CSW called and left voice message with patient's (DSS)Legal Guardian Alan Small # [phone].  Thurmond Butts, MSW, [place] Clinical Social Worker

## 2019-05-31 NOTE — TOC Initial Note (Signed)
Transition of Care [facility]) - Initial/Assessment Note    Patient Details  Name: Alan Small MRN: [id number] Date of Birth: June 07, 1955  [facility] CM/SW Contact:    Vinie Sill, [place] Phone Number: 05/31/2019, 4:38 PM  Clinical Narrative:                  CSW spoke with patient's Legal Guardian ([place]) [name]. CSW introduced self and explained role. Patient's legal guardian explained patient resides at [facility] group home. CSW discussed PT recommendation of ST rehab at [facility]. Patient has been to rehab at [facility] from 09/30-11/14. Patient's guardian is agreeable to [place] rehab at [facility] and would prefer SNF in [facility]. CSW was given permission to send referrals in [facility] as back plan. CSW explained the SNF process and answered all questions.   CSW received copy of Legal Guardian appointment and after hours/holidays/ emergency contact information- copy placed in chart.   CSW will provide bed offers once available.  Thurmond Butts, MSW, [place] Clinical Social Worker   Expected Discharge Plan: Skilled Nursing Facility Barriers to Discharge: SNF Pending bed offer, Continued Medical Work up, Environmental education officer)   Patient Goals and CMS Choice        Expected Discharge Plan and Services Expected Discharge Plan: [place] In-house Referral: Clinical Social Work     Living arrangements for the past 2 months: Group Home([facility])                                      Prior Living Arrangements/Services Living arrangements for the past 2 months: Group Home([facility]) Lives with:: Facility Resident Patient language and need for interpreter reviewed:: No              Criminal Activity/Legal Involvement Pertinent to Current Situation/Hospitalization: No - Comment as needed  Activities of Daily Living Home Assistive Devices/Equipment: Cane (specify quad or  straight) ADL Screening (condition at time of admission) Patient's cognitive ability adequate to safely complete daily activities?: Yes Is the patient deaf or have difficulty hearing?: No Does the patient have difficulty seeing, even when wearing glasses/contacts?: No Does the patient have difficulty concentrating, remembering, or making decisions?: No Patient able to express need for assistance with ADLs?: Yes Does the patient have difficulty dressing or bathing?: Yes Independently performs ADLs?: No Communication: Needs assistance Is this a change from baseline?: Change from baseline, expected to last <3 days Dressing (OT): Needs assistance Is this a change from baseline?: Change from baseline, expected to last <3days Grooming: Needs assistance Is this a change from baseline?: Change from baseline, expected to last <3 days Feeding: Needs assistance Is this a change from baseline?: Change from baseline, expected to last <3 days Bathing: Needs assistance Is this a change from baseline?: Change from baseline, expected to last <3 days Toileting: Needs assistance Does the patient have difficulty walking or climbing stairs?: Yes Weakness of Legs: None Weakness of Arms/Hands: Both  Permission Sought/Granted Permission sought to share information with : Family Supports, Customer service manager, Case Manager Permission granted to share information with : Yes, Verbal Permission Granted  Share Information with NAME: [name]  ([place]) Legal Guardian [phone]  Permission granted to share info w AGENCY: SNfs  Permission granted to share info w Relationship: [name]  ([place]) Legal Guardian [phone]  Permission granted to  share info w Contact Information: [name]  (DSS) Legal [phone]  Emotional Assessment       Orientation: : Oriented to Self, Oriented to Place, Oriented to  Time, Oriented to Situation Alcohol / Substance Use: Not Applicable Psych  Involvement: No (comment)  Admission diagnosis:  Empyema [facility]) [J86.9] Patient Active Problem List   Diagnosis Date Noted  . Pyomyositis 05/27/2019  . Empyema of pleural space ([place]) 05/04/2019  . Septic embolism ([place]) 05/23/2019  . 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 ETOH abuse 05/06/2019  . Atrial flutter ([place])   . MRSA bacteremia   . Community acquired pneumonia 01/20/2019  . Gastroenteritis 01/20/2019  . Blood in stool   . Change in bowel habits   . Constipation   . Polyp of sigmoid colon   . Depression 02/26/2016  . COPD (chronic obstructive pulmonary disease) ([place]) 02/03/2016  . Suprapubic catheter ([place]) 12/17/2015  . Urethral stricture 12/17/2015  . Urinary retention 10/27/2015  . Tobacco use disorder 08/27/2015  . Malnutrition of moderate degree 05/09/2015  . Sepsis ([place]) 05/06/2015  . Pyohydronephrosis 01/17/2015  . Hypoglycemia 01/17/2015  . Gastroesophageal reflux disease 10/28/2014  . Bright red rectal bleeding 10/26/2014  . Chronic constipation 10/26/2014  . Hydronephrosis of left kidney 01/17/2014  . Neurogenic bladder 06/21/2013  . Urolithiasis 03/15/2013  . Nephrolithiasis 03/04/2013  . Metabolic acidosis 40/03/2724  . Acute renal failure ([place]) 11/03/2012  . Hyperkalemia 11/03/2012  . Pyelonephritis 11/03/2012  . Schizophrenia ([place]) 11/03/2012  . Hypothyroidism 11/03/2012  . Severe sepsis ([place]) 11/03/2012  . Protein-calorie malnutrition, severe ([place]) 11/03/2012  . Encephalopathy acute 11/03/2012  . Chronic kidney disease, stage IV (severe) ([place]) 07/02/2012  . 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 Kidney stone 04/13/2012   PCP:  [name], MD Pharmacy:   [facility] [address] [place] [address] Phone: [phone] Fax: [phone]     Social Determinants of Health (SDOH) Interventions    Readmission Risk Interventions Readmission Risk Prevention Plan 05/13/2019  Transportation Screening Complete   Palliative Care Screening Not Applicable  Medication Review (RN Care Manager) Complete  Some recent data might be hidden

## 2019-05-31 NOTE — Progress Notes (Signed)
Chest tube removed as ordered. Dressing applied. Will monitor patient. Braxley Balandran, Bettina Gavia RN

## 2019-05-31 NOTE — Progress Notes (Signed)
      [address]       [place] [address]             [phone]      6 Days Post-Op Procedure(s) (LRB): LEFT VIDEO ASSISTED THORACOSCOPY (VATS)/DECORTICATION; LEFT LOWER LOBE WEDGE RESECTION; DRAINAGE OF EMPYEMA (Left) Subjective: Short of breath this morning. Otherwise feels okay.   Objective: Vital signs in last 24 hours: Temp:  [97.9 F (36.6 C)-98.7 F (37.1 C)] 97.9 F (36.6 C) (12/29 0515) Pulse Rate:  [70-85] 79 (12/29 0515) Cardiac Rhythm: Normal sinus rhythm (12/28 1934) Resp:  [15-30] 15 (12/29 0515) BP: (145-166)/(79-91) 160/79 (12/29 0515) SpO2:  [92 %-100 %] 100 % (12/29 0515) Arterial Line BP: (157)/(83) 157/83 (12/28 1016)     Intake/Output from previous day: 12/28 0701 - 12/29 0700 In: 914.5 [I.V.:599.5; Blood:315] Out: 8099 [Urine:1725; Drains:45] Intake/Output this shift: No intake/output data recorded.  General appearance: alert, cooperative and no distress Heart: regular rate and rhythm, S1, S2 normal, no murmur, click, rub or gallop Lungs: clear to auscultation bilaterally Abdomen: soft, non-tender; bowel sounds normal; no masses,  no organomegaly Extremities: edema in all extremities Wound: clean and dry  Lab Results: Recent Labs    05/29/19 0231 05/30/19 0230 05/30/19 1529  WBC 10.7* 8.1  --   HGB 7.7* 6.7* 8.5*  HCT 24.9* 21.8* 27.1*  PLT 357 340  --    BMET:  Recent Labs    05/29/19 0231 05/29/19 1802 05/30/19 0230  NA 136  --  135  K 5.5* 4.9 4.7  CL 112*  --  108  CO2 19*  --  20*  GLUCOSE 82  --  103*  BUN 42*  --  43*  CREATININE 2.34*  --  2.32*  CALCIUM 8.2*  --  8.1*    PT/INR: No results for input(s): LABPROT, INR in the last 72 hours. ABG    Component Value Date/Time   PHART 7.214 (L) 05/22/2019 1709   HCO3 21.7 05/05/2019 1709   TCO2 23 05/26/2019 1709   ACIDBASEDEF 6.0 (H) 05/14/2019 1709   O2SAT 100.0 05/22/2019 1709   CBG (last 3)  Recent Labs    05/30/19 1644 05/30/19 2018  05/31/19 0630  GLUCAP 135* 105* 85    Assessment/Plan: S/P Procedure(s) (LRB): LEFT VIDEO ASSISTED THORACOSCOPY (VATS)/DECORTICATION; LEFT LOWER LOBE WEDGE RESECTION; DRAINAGE OF EMPYEMA (Left)  1. Chest tube remains in place. CXR showed: Unchanged residual pleural fluid and/or thickening on the left. Stable left chest tube positioning. 2. H and H 8.5/27.1, s/p 1 unit of RBC. 3. No drainage recorded?  4. renal fxn stable, trends cont to improve slowly , has chronic insuff.  5. medical management as per hospitalist  Plan: No chest tube drainage in 24 hours. Will pull chest tube today. Micro with no growth. Continue pulm toilet with IS and flutter valve. OOB to chair today if able. Will defer lasix administration to primary. Probably needs another dose of 20mg  IV today. Reordered home Duo Nebs.     LOS: 7 days    Alan Small 05/31/2019

## 2019-05-31 NOTE — Progress Notes (Signed)
PROGRESS NOTE  Alan Small JME:[id number] DOB: 27-Oct-1955 DOA: 05/26/2019 PCP: [name], MD  HPI/Recap of past 24 hours: Alan Small a 63 y.o.malewith medical history significant ofsolitary left kidney, CKD stage IV, left staghorn calculus SP stenting, urethral stricture SP urethroplasty, schizophrenia, clinically diagnosed COPD, depression.  Patient was admitted at [facility] between February 10, 2019 and March 02, 2019. Patient was treated for pyelonephritis and retroperitoneal fluid collection concerning for abscess. Urine culture grew MDR E. coli. Patient underwent aspiration of the abscess on 9/17 with JP drain placement. Patient was started on IV imipenem with a PICC line. Summary ofhisactive problems in the hospital is as following.Underwent left percutaneous nephrolithotomy 03/28/2019 and ureteral stent placement at the same time. Underwent laser lithotripsy and basket extraction on 04/05/2019. Patient was recommended to continue the antibiotics until April 16, 2019.  Patient presented to [facility] on 05/11/2019 with shortness of breath left leg pain and fever and nausea and vomiting. CT scan showed evidence of stone on the left side. Patient was initially placed on the BiPAP. His oxygenation worsened and patient actually required intubation in the emergency department. Urology was consulted and patient underwent urgent cystoscopy and left ureteral stent placement. Patient was started on broad-spectrum antibiotics and his blood cultures came back positive for MRSA. Patient was extubated on 05/20/2019 and since then has remained on nasal cannula. Patient's PICC line was removed on 05/12/2019 and a central line was placed which was removed on 05/18/2019. Foley catheter was inserted on 05/11/2019 after the urology procedure and removed on 05/18/2019. TEE showed no evidence of endocarditis but the patient does have evidence of septic emboli with cavitary lesions  on bilateral lung parenchyma therefore patient is treated as infective endocarditis. 05/19/2019 left thoracentesis/pleural fluid culture came back positive for MRSA. Report showed loculated effusion post procedure. 05/20/2019 had pyomyositis underwent right-sided drain placement after incision and debridement. -Due to patient's complaint of back pain MRI thoracic and lumbar spine were performed on 05/21/2019 which showed evidence of epidural abscess as well as facet joint infection. [facility] neurosurgery was consulted who recommended conservative management. Dr. [name] was on call. -Due to worsening shortness of breath CT chest performed on 05/22/2019 showed evidence of recollection of pleural fluid. Chest tube was inserted by PCCM on 05/22/2019. -VATS 12/23 - tolerated well; on Ceftaroline now. Clinically improving.  05/31/19: Seen and examined.  Per bedside p.m. RN no output from chest tube overnight.  Hemoglobin stable 8.5 from 6.7 post 1 unit PRBC transfusion 12/28.  He has no new complaints.   Assessment/Plan: Principal Problem:   Empyema of pleural space (HCC) Active Problems:   Schizophrenia (HCC)   Hypothyroidism   Severe sepsis (HCC)   Pyohydronephrosis   Kidney stone   Chronic kidney disease, stage IV (severe) (HCC)   COPD (chronic obstructive pulmonary disease) (HCC)   Atrial flutter (HCC)   MRSA bacteremia   Septic embolism (HCC)   Epidural abscess   ETOH abuse  Resolving severe sepsiswith septic shock secondary to disseminated MRSA infection thought to have stemmed from a tunneled PICC line that was not maintained/removed following 8-week course of IV antibiotics, septic shock physiology is resolved, POA Pyomyositis SP right-sided flank drain placement POA Left-sided empyema SP thoracentesis/VATS POA. And now chest tube placement Epidural abscess as well as L4-L5 infection - Blood cultures came back positive for MRSA; Urine culture came back positive for VRE. -TEE negative  for vegetation, but CT chest was positive for evidence of septic emboli therefore patient  is being treated as a possible right-sided infective endocarditis. - TCTS following - VATS with decortication on 05/07/2019 w/ 2 chest tubes, now removed on 05/31/2019.  Pain controlled.  Continue flutter valve and incentive spirometer.  Out of bed to chair as recommended by CTS. - Urology was initially consulted as well which currently has signed off as below. - Neurosurgery at [facility] with Dr. [name] aware who recommends conservative management with IV antibiotics as long as the patient does not have any symptoms (remains AOx4) -Infectious disease following, patient currently transitioned to ceftaroline, to finish total of 6 weeks,  So far repeat blood cultures 12/23/negative to date. Will follow up with Dr. Delaine Lame, [place] outpatient. Independently reviewed chest x-ray done on 05/31/2019 which showed small left pleural effusion.  Obstructing/infected renal calculus, Concurrent VRE UTI, likely POA SP emergency ureteral stent placement - Cystoscopy on 05/11/2019 and also was found to have a bulbar urethral stricture.  - ID following, patient currently on ceftaroline per ID - The stricture was dilated.  - The stent was placed in the left ureter.  - The urine culture on 05/11/2019 was negative. A repeat urine culture was sent on 05/16/2019 and that is positive for VRE - Urology no longer following - call back if needed  A. fib/flutter with rapid ventricular rate, resolved Rate is currently controlled on p.o. Lopressor 25 mg twice daily - Anticoagulation not recommended due to history of fall - Echo with normal LV function Continue to monitor on telemetry  Acute hypoxic respiratory failure likely in the setting of left empyema, improving O2 saturation 100% on 2 L Continue pulmonary toilet, incentive spirometer, flutter valve, and IV antibiotics Left chest tube removed on 05/31/2019.  Acute blood loss  anemia/iron deficiency anemia  Improving post 1 unit PRBC Hemoglobin stable 8.5 from 6.7 post 1 unit PRBC transfusion 12/28 -Iron studies suggestive of iron deficiency Continue daily ferrous sulfate  Worsening acute transaminitis Acute hepatitis panel negative CT abdomen and pelvis done on 05/18/2019 without contrast: Multiple low-density lesions again seen within the liver, unchanged, likely cysts. Large exophytic cyst off the left hepatic lobe is stable. Gallbladder unremarkable. Worsening alkaline phosphatase, AST and ALT  Alcohol withdrawal syndrome, resolved Out of alcohol withdrawal window.  No sign of alcohol withdrawal.  Acute on chronic renal failure, improving Solitary kidney Recent Labs       Lab Results  Component Value Date   CREATININE 2.35 (H) 05/28/2019   CREATININE 2.54 (H) 05/27/2019   CREATININE 2.71 (H) 05/26/2019    - Unknown baseline - Peak creatinine at this stay 3.2 (limited chart review baseline around 2.5 with peak of 5 01/22/19) Creatinine trending down Urine output 1.7 L recorded in the last 24 hours  Non anion gap metabolic acidosis Chemistry bicarb 19, anion gap 5>> improving Continue po sodium bicarb 1300 mg twice daily x2 days. Repeat BMP in the morning  Resolved hyperkalemia, likely in the setting of AKI Treated with IV calcium gluconate 1 g once and 15 g po kayexalate once and 1 dose of Lokelma. Continue daily BMPs  Paranoid schizophrenia/chronic anxiety Stable  Resolved hypovolemic hypernatremia/   DVT prophylaxis: Heparin sq TID Code Status: Full Family Communication: None at bedside  Disposition Plan:  Will plan to DC once CTS and general surgery sign off and patient is hemodynamically stable.  Consultants:   TCTS  Infectious disease  Neurology  General surgery     Objective: Vitals:   05/30/19 2003 05/30/19 2023 05/30/19 2101 05/31/19 0515  BP: [name])  150/87   (!) 160/79  Pulse: 79 78 70 79  Resp: (!) 28   15 15   Temp: 98.4 F (36.9 C)   97.9 F (36.6 C)  TempSrc: Oral   Oral  SpO2: 100%  98% 100%  Weight:      Height:        Intake/Output Summary (Last 24 hours) at 05/31/2019 9562 Last data filed at 05/31/2019 0600 Gross per 24 hour  Intake 914.47 ml  Output 1770 ml  Net -855.53 ml   Filed Weights   05/15/2019 2200  Weight: 90.1 kg    Exam:  . General: 63 y.o. year-old male well-developed well-nourished in no acute distress.  Alert oriented x3.   . Cardiovascular: Regular rate and rhythm no rubs or gallops.   [name] Respiratory: Mild rales at bases no wheezing noted.  Poor respiratory effort.  Chest tube in place at the time of this visit.   . Abdomen: Soft normal bowel sounds present.   . Musculoskeletal: Diffuse edema in all 4 extremities.   Psychiatry: Mood is appropriate for condition  Data Reviewed: CBC: Recent Labs  Lab 05/26/19 0240 05/27/19 0325 05/28/19 0404 05/29/19 0231 05/30/19 0230 05/30/19 1529  WBC 12.7* 13.8* 10.9* 10.7* 8.1  --   HGB 8.2* 7.3* 7.2* 7.7* 6.7* 8.5*  HCT 26.4* 23.4* 23.8* 24.9* 21.8* 27.1*  MCV 94.3 94.4 96.4 95.0 95.2  --   PLT 404* 361 328 357 340  --    Basic Metabolic Panel: Recent Labs  Lab 05/26/19 0240 05/27/19 0325 05/28/19 0404 05/29/19 0231 05/29/19 1802 05/30/19 0230  NA 137 137 138 136  --  135  K 5.7* 5.6* 5.0 5.5* 4.9 4.7  CL 113* 113* 113* 112*  --  108  CO2 19* 18* 19* 19*  --  20*  GLUCOSE 91 95 85 82  --  103*  BUN 41* 43* 38* 42*  --  43*  CREATININE 2.71* 2.54* 2.35* 2.34*  --  2.32*  CALCIUM 8.0* 8.0* 7.8* 8.2*  --  8.1*   GFR: Estimated Creatinine Clearance: 35.3 mL/min (A) (by C-G formula based on SCr of 2.32 mg/dL (H)). Liver Function Tests: Recent Labs  Lab 05/26/19 0240 05/27/19 0325 05/28/19 0404 05/29/19 0231 05/30/19 0230  AST 29 35 84* 64* 145*  ALT 26 25 51* 51* 116*  ALKPHOS 161* 170* 252* 267* 551*  BILITOT 1.0 0.6 0.5 0.6 0.6  PROT 5.2* 5.2* 5.1* 5.3* 5.3*  ALBUMIN 1.3* 1.2* 1.1*  1.1* 1.1*   No results for input(s): LIPASE, AMYLASE in the last 168 hours. No results for input(s): AMMONIA in the last 168 hours. Coagulation Profile: Recent Labs  Lab 05/05/2019 1400  INR 1.1   Cardiac Enzymes: No results for input(s): CKTOTAL, CKMB, CKMBINDEX, TROPONINI in the last 168 hours. BNP (last 3 results) No results for input(s): PROBNP in the last 8760 hours. HbA1C: No results for input(s): HGBA1C in the last 72 hours. CBG: Recent Labs  Lab 05/30/19 0617 05/30/19 1305 05/30/19 1644 05/30/19 2018 05/31/19 0630  GLUCAP 116* 100* 135* 105* 85   Lipid Profile: No results for input(s): CHOL, HDL, LDLCALC, TRIG, CHOLHDL, LDLDIRECT in the last 72 hours. Thyroid Function Tests: No results for input(s): TSH, T4TOTAL, FREET4, T3FREE, THYROIDAB in the last 72 hours. Anemia Panel: Recent Labs    05/29/19 0839  VITAMINB12 704  FERRITIN 406*  TIBC 132*  IRON 12*   Urine analysis:    Component Value Date/Time   COLORURINE YELLOW 05/26/2019  [place] (A) 05/26/2019 0245   APPEARANCEUR Cloudy (A) 12/01/2018 0927   LABSPEC 1.012 05/26/2019 0245   LABSPEC 1.010 02/16/2014 1650   PHURINE 5.0 05/26/2019 0245   GLUCOSEU NEGATIVE 05/26/2019 0245   GLUCOSEU Negative 02/16/2014 1650   HGBUR LARGE (A) 05/26/2019 0245   BILIRUBINUR NEGATIVE 05/26/2019 0245   BILIRUBINUR Negative 12/01/2018 0927   BILIRUBINUR Negative 02/16/2014 1650   KETONESUR NEGATIVE 05/26/2019 0245   PROTEINUR 30 (A) 05/26/2019 0245   UROBILINOGEN 0.2 11/07/2012 1049   NITRITE NEGATIVE 05/26/2019 0245   LEUKOCYTESUR LARGE (A) 05/26/2019 0245   LEUKOCYTESUR 3+ 02/16/2014 1650   Sepsis Labs: @LABRCNTIP (procalcitonin:4,lacticidven:4)  ) Recent Results (from the past 240 hour(s))  Aerobic/Anaerobic Culture (surgical/deep wound)     Status: None   Collection Time: 05/15/2019  3:41 PM   Specimen: PATH Cytology Pleural fluid; Body Fluid  Result Value Ref Range Status   Specimen  Description FLUID LEFT PLEURAL  Final   Special Requests PT ON ANCEF  Final   Gram Stain NO WBC SEEN NO ORGANISMS SEEN   Final   Culture   Final    No growth aerobically or anaerobically. Performed at [facility], [place] [address], [place], [place] [address]    Report Status 05/30/2019 FINAL  Final  Aerobic/Anaerobic Culture (surgical/deep wound)     Status: None   Collection Time: 05/18/2019  3:49 PM   Specimen: PATH Cytology Pleural fluid; Body Fluid  Result Value Ref Range Status   Specimen Description FLUID PLEURAL LEFT  Final   Special Requests NO 2 PT ON ANCEF  Final   Gram Stain   Final    RARE WBC PRESENT,BOTH PMN AND MONONUCLEAR NO ORGANISMS SEEN    Culture   Final    No growth aerobically or anaerobically. Performed at [facility], [place] [address], [place], [place] [address]    Report Status 05/30/2019 FINAL  Final      Studies: DG Chest Port 1 View  Result Date: 05/31/2019 CLINICAL DATA:  Chest tube EXAM: PORTABLE CHEST 1 VIEW COMPARISON:  Yesterday FINDINGS: Left chest tube in stable position. No visible pneumothorax or increasing pleural fluid/thickening. There is still volume loss and hazy density on the left. Low volume but relatively clear right lung. Stable generous heart size accentuated by mediastinal fat by recent CT. IMPRESSION: Unchanged residual pleural fluid and/or thickening on the left. Stable left chest tube positioning. Electronically Signed   By: Monte Fantasia M.D.   On: 05/31/2019 07:27    Scheduled Meds: . sodium chloride   Intravenous Once  . bisacodyl  10 mg Oral Daily  . Chlorhexidine Gluconate Cloth  6 each Topical Daily  . feeding supplement (ENSURE ENLIVE)  237 mL Oral TID BM  . feeding supplement (PRO-STAT SUGAR FREE 64)  30 mL Oral BID  . ferrous sulfate  325 mg Oral Q breakfast  . heparin injection (subcutaneous)  5,000 Units Subcutaneous Q8H  . insulin aspart  0-24 Units Subcutaneous TID AC & HS  . ipratropium-albuterol   3 mL Nebulization BID  . levothyroxine  50 mcg Oral QAC breakfast  . metoprolol tartrate  25 mg Oral BID  . multivitamin with minerals  1 tablet Oral Daily  . nicotine  21 mg Transdermal Daily  . senna-docusate  1 tablet Oral QHS    Continuous Infusions: . sodium chloride    . ceftaroline (TEFLARO) 400 mg IVPB 400 mg (05/31/19 0526)     LOS:  7 days     Kayleen Memos, MD [facility] Pager [phone]  If 7PM-7AM, please contact night-coverage [email or web address] Password [facility] 05/31/2019, 8:28 AM

## 2019-05-31 NOTE — Plan of Care (Signed)
Continue to monitor

## 2019-06-01 ENCOUNTER — Inpatient Hospital Stay (HOSPITAL_COMMUNITY): Payer: Medicare Other

## 2019-06-01 LAB — BASIC METABOLIC PANEL WITH GFR
Anion gap: 8 (ref 5–15)
BUN: 60 mg/dL — ABNORMAL HIGH (ref 8–23)
CO2: 18 mmol/L — ABNORMAL LOW (ref 22–32)
Calcium: 8 mg/dL — ABNORMAL LOW (ref 8.9–10.3)
Chloride: 108 mmol/L (ref 98–111)
Creatinine, Ser: 3.29 mg/dL — ABNORMAL HIGH (ref 0.61–1.24)
GFR calc Af Amer: 22 mL/min — ABNORMAL LOW
GFR calc non Af Amer: 19 mL/min — ABNORMAL LOW
Glucose, Bld: 118 mg/dL — ABNORMAL HIGH (ref 70–99)
Potassium: 6.3 mmol/L (ref 3.5–5.1)
Sodium: 134 mmol/L — ABNORMAL LOW (ref 135–145)

## 2019-06-01 LAB — COMPREHENSIVE METABOLIC PANEL
ALT: 57 U/L — ABNORMAL HIGH (ref 0–44)
AST: 38 U/L (ref 15–41)
Albumin: 1.2 g/dL — ABNORMAL LOW (ref 3.5–5.0)
Alkaline Phosphatase: 353 U/L — ABNORMAL HIGH (ref 38–126)
Anion gap: 9 (ref 5–15)
BUN: 54 mg/dL — ABNORMAL HIGH (ref 8–23)
CO2: 19 mmol/L — ABNORMAL LOW (ref 22–32)
Calcium: 8.1 mg/dL — ABNORMAL LOW (ref 8.9–10.3)
Chloride: 107 mmol/L (ref 98–111)
Creatinine, Ser: 3.26 mg/dL — ABNORMAL HIGH (ref 0.61–1.24)
GFR calc Af Amer: 22 mL/min — ABNORMAL LOW (ref >60)
GFR calc non Af Amer: 19 mL/min — ABNORMAL LOW (ref >60)
Glucose, Bld: 87 mg/dL (ref 70–99)
Potassium: 5.4 mmol/L — ABNORMAL HIGH (ref 3.5–5.1)
Sodium: 135 mmol/L (ref 135–145)
Total Bilirubin: 0.7 mg/dL (ref 0.3–1.2)
Total Protein: 5.6 g/dL — ABNORMAL LOW (ref 6.5–8.1)

## 2019-06-01 LAB — GLUCOSE, CAPILLARY
Glucose-Capillary: 84 mg/dL (ref 70–99)
Glucose-Capillary: 91 mg/dL (ref 70–99)
Glucose-Capillary: 131 mg/dL — ABNORMAL HIGH (ref 70–99)
Glucose-Capillary: 118 mg/dL — ABNORMAL HIGH (ref 70–99)

## 2019-06-01 LAB — CBC
HCT: 24.7 % — ABNORMAL LOW (ref 39.0–52.0)
Hemoglobin: 7.7 g/dL — ABNORMAL LOW (ref 13.0–17.0)
MCH: 28.6 pg (ref 26.0–34.0)
MCHC: 31.2 g/dL (ref 30.0–36.0)
MCV: 91.8 fL (ref 80.0–100.0)
Platelets: 282 10*3/uL (ref 150–400)
RBC: 2.69 MIL/uL — ABNORMAL LOW (ref 4.22–5.81)
RDW: 14.9 % (ref 11.5–15.5)
WBC: 8.5 10*3/uL (ref 4.0–10.5)
nRBC: 0 % (ref 0.0–0.2)

## 2019-06-01 LAB — OCCULT BLOOD X 1 CARD TO LAB, STOOL: Fecal Occult Bld: POSITIVE — AB

## 2019-06-01 LAB — POTASSIUM: Potassium: 5.3 mmol/L — ABNORMAL HIGH (ref 3.5–5.1)

## 2019-06-01 MED ORDER — SODIUM CHLORIDE 0.9 % IV SOLN: INTRAVENOUS | Status: DC

## 2019-06-01 MED ORDER — SODIUM BICARBONATE 650 MG PO TABS
1300.0000 mg | ORAL_TABLET | Freq: Two times a day (BID) | ORAL | Status: DC
  Administered 2019-06-01 – 2019-06-06 (×11): 1300 mg via ORAL
  Filled 2019-06-01 (×11): qty 2

## 2019-06-01 MED ORDER — CALCIUM GLUCONATE-NACL 1-0.675 GM/50ML-% IV SOLN
1.0000 g | Freq: Once | INTRAVENOUS | Status: AC
  Administered 2019-06-01: 08:00:00 1000 mg via INTRAVENOUS
  Filled 2019-06-01: qty 50

## 2019-06-01 MED ORDER — PANTOPRAZOLE SODIUM 40 MG IV SOLR
40.0000 mg | Freq: Two times a day (BID) | INTRAVENOUS | Status: DC
  Administered 2019-06-01 – 2019-06-07 (×14): 40 mg via INTRAVENOUS
  Filled 2019-06-01 (×14): qty 40

## 2019-06-01 MED ORDER — SODIUM CHLORIDE 0.9 % IV SOLN
300.0000 mg | Freq: Three times a day (TID) | INTRAVENOUS | Status: DC
  Administered 2019-06-01 – 2019-06-08 (×21): 300 mg via INTRAVENOUS
  Filled 2019-06-01 (×32): qty 300

## 2019-06-01 MED ORDER — SODIUM ZIRCONIUM CYCLOSILICATE 5 G PO PACK
5.0000 g | PACK | Freq: Once | ORAL | Status: AC
  Administered 2019-06-01: 07:00:00 5 g via ORAL
  Filled 2019-06-01: qty 1

## 2019-06-01 MED ORDER — NEPRO/CARBSTEADY PO LIQD
237.0000 mL | Freq: Two times a day (BID) | ORAL | Status: DC
  Administered 2019-06-01 – 2019-06-07 (×9): 237 mL via ORAL

## 2019-06-01 MED ORDER — SODIUM ZIRCONIUM CYCLOSILICATE 10 G PO PACK
10.0000 g | PACK | Freq: Every day | ORAL | Status: DC
  Administered 2019-06-01 – 2019-06-03 (×3): 10 g via ORAL
  Filled 2019-06-01 (×3): qty 1

## 2019-06-01 NOTE — Progress Notes (Signed)
Nutrition Follow up  DOCUMENTATION CODES:   Not applicable  INTERVENTION:   -D/C Ensure Enlive    Add Nepro Shake po BID, each supplement provides 425 kcal and 19 grams protein  Continue MVI daily  Continue Prostat 30 ml po BID, each supplement provides 100 kcal and 15 grams of protein  NUTRITION DIAGNOSIS:   Increased nutrient needs related to acute illness, wound healing(empyema of pleural space s/p left thoracoscopy, left lower lobe resection, and drainage) as evidenced by estimated needs(patient eating 44% average of last 4 recorded meals).   GOAL:   Patient will meet greater than or equal to 90% of their needs   MONITOR:   PO intake, Labs, I & O's, Supplement acceptance, Weight trends, Skin  REASON FOR ASSESSMENT:   Consult Assessment of nutrition requirement/status  ASSESSMENT:  RD working remotely.  63 year old male with past medical history of COPD, solitary left kidney, CKD IV, left staghorn calculus s/p stenting, urethral stricture s/p urethroplasty, schizophrenia, depression who was recently admitted to [facility] 12/19 for epidural abscess and facet joint infection and transferred to [facility] 12/20 for further evaluation by cardiothoracic surgery s/p CT of chest showed evidence of recollection of pleural fluid.  12/9- intubated 12/17- left thoracentesis/pleural fluid positive for MRSA 12/18- pyomyositis; underwent right-sided drain placement 12/18- extubated,  12/20- chest tube 12/23- s/p L VATS, LLL wedge resection, drainage of L empyema  Pt reports having a great appetite. Meal completions charted as 25-100% for his last 5 meals. Drinking Ensure and Prostat BID. Pt with refractory hyperkalemia. Given lokelma.   Admission weight: 90.1 kg (no recent weight documented)   I/O: -1,519 ml since admit  UOP: 1,000 ml x 24 hrs  JP drain: 80 ml x 24 hrs   Medications: dulcolax, ferrous sulfate, SS novolog, MVI with minerals, senokot  Labs: Na 134 (L) K 6.3 (L)    Diet Order:   Diet Order            Diet Heart Room service appropriate? Yes with Assist; Fluid consistency: Thin  Diet effective now              EDUCATION NEEDS:   No education needs have been identified at this time  Skin:  Skin Assessment: Reviewed RN Assessment  Last BM:  12/28  Height:   Ht Readings from Last 1 Encounters:  06/01/2019 6' (1.829 m)    Weight:   Wt Readings from Last 1 Encounters:  05/21/2019 90.1 kg    Ideal Body Weight:  80.9 kg  BMI:  Body mass index is 26.94 kg/m.  Estimated Nutritional Needs:   Kcal:  2075-2250  Protein:  135-144  Fluid:  >/= 2 L/day   Mariana Single RD, LDN Clinical Nutrition Pager # - [phone]

## 2019-06-01 NOTE — Progress Notes (Signed)
Physical Therapy Treatment Patient Details Name: Alan Small MRN: [id number] DOB: 09-05-1955 Today's Date: 06/01/2019    History of Present Illness Patient is a 63 y/o male who presents with empyema. Chest CT 12/20- showed evidence of recollection of pleural fluid. Chest tube was inserted 05/22/2019. s/p left loculated effusion and VATs 12/23. Multiple admissions at [facility], [facility] recently. PMH includes left staghorn calculus SP stenting, urethral stricture SP urethroplasty, COPD, depression, paranoid schizophrenia, renal failure, CVA, seizures, MI.    PT Comments    Patient received in bed, DOE 3/4 but VSS on room air, willing to get to chair to eat lunch with therapy. Able to complete bed mobility with min guard and extended time, continues to require ModAx2 to boost to full standing position with RW, then able to gait train approximately 37f with RW and min guardx2 to chair. Very easily fatigued, VSS however spo2 signal not the best during session. Patient left up in the chair with all needs met, chair alarm active this afternoon. Continue to recommend SNF moving forward.      Follow Up Recommendations  SNF     Equipment Recommendations  Rolling walker with 5" wheels    Recommendations for Other Services       Precautions / Restrictions Precautions Precautions: Fall Precaution Comments: h/o seizures, DOE Restrictions Weight Bearing Restrictions: No Other Position/Activity Restrictions: Watch HR    Mobility  Bed Mobility Overal bed mobility: Needs Assistance Bed Mobility: Supine to Sit     Supine to sit: Min guard;HOB elevated     General bed mobility comments: min guard for safety and line management, increased time/effort  Transfers Overall transfer level: Needs assistance Equipment used: Rolling walker (2 wheeled) Transfers: Sit to/from Stand Sit to Stand: Mod assist;+2 physical assistance         General transfer comment: ModAx2 to boost to full standing  position and gain balance, cues for safe hand placement and sequencing  Ambulation/Gait Ambulation/Gait assistance: Min guard Gait Distance (Feet): 2 Feet Assistive device: Rolling walker (2 wheeled) Gait Pattern/deviations: Trunk flexed;Decreased step length - right;Decreased step length - left;Shuffle Gait velocity: decreased   General Gait Details: Very easily fatigued, slow and guarded with RW, remains very weak. DOE 3/4   Stairs             Wheelchair Mobility    Modified Rankin (Stroke Patients Only)       Balance Overall balance assessment: Needs assistance Sitting-balance support: Feet supported;No upper extremity supported Sitting balance-Leahy Scale: Fair Sitting balance - Comments: min guard for safety   Standing balance support: Bilateral upper extremity supported;During functional activity Standing balance-Leahy Scale: Poor Standing balance comment: min guard and RW for upright balance                            Cognition Arousal/Alertness: Awake/alert Behavior During Therapy: WFL for tasks assessed/performed Overall Cognitive Status: Within Functional Limits for tasks assessed                                        Exercises      General Comments General comments (skin integrity, edema, etc.): VSS on room air, spO2 signal not the best however, DOE at rest and with activity      Pertinent Vitals/Pain Pain Assessment: No/denies pain Pain Score: 0-No pain Pain Intervention(s): Limited activity within patient's  tolerance;Monitored during session    Home Living                      Prior Function            PT Goals (current goals can now be found in the care plan section) Acute Rehab PT Goals Patient Stated Goal: To get stronger and feel better PT Goal Formulation: With patient Time For Goal Achievement: 07-08-2019 Potential to Achieve Goals: Good Progress towards PT goals: Progressing toward goals     Frequency    Min 2X/week      PT Plan Current plan remains appropriate    Co-evaluation              AM-PAC PT "6 Clicks" Mobility   Outcome Measure  Help needed turning from your back to your side while in a flat bed without using bedrails?: A Little Help needed moving from lying on your back to sitting on the side of a flat bed without using bedrails?: A Little Help needed moving to and from a bed to a chair (including a wheelchair)?: A Little Help needed standing up from a chair using your arms (e.g., wheelchair or bedside chair)?: A Lot Help needed to walk in hospital room?: A Little Help needed climbing 3-5 steps with a railing? : Total 6 Click Score: 15    End of Session Equipment Utilized During Treatment: Gait belt Activity Tolerance: Patient limited by fatigue Patient left: in chair;with call bell/phone within reach;with chair alarm set   PT Visit Diagnosis: Difficulty in walking, not elsewhere classified (R26.2);Muscle weakness (generalized) (M62.81);Pain Pain - Right/Left: Left Pain - part of body: (chest tube site)     Time: 8242-3536 PT Time Calculation (min) (ACUTE ONLY): 18 min  Charges:  $Therapeutic Activity: 8-22 mins                     Windell Norfolk, DPT, PN1   Supplemental Physical Therapist [place]    Pager [phone] Acute Rehab Office [phone]

## 2019-06-01 NOTE — Discharge Instructions (Signed)
Video-Assisted Thoracic Surgery, Care After This sheet gives you information about how to care for yourself after your procedure. Your doctor may also give you more specific instructions. If you have problems or questions, contact your doctor. What can I expect after the procedure? After the procedure, it is common to have:  Some pain and soreness in your chest.  Pain when you breathe in (inhale) and cough.  Trouble pooping (constipation).  Tiredness (fatigue).  Trouble sleeping. Follow these instructions at home: Preventing lung infection (pneumonia)  Take deep breaths or do breathing exercises as told by your doctor.  Cough often. Coughing is important to clear thick spit (phlegm) and open your lungs.  You can make coughing hurt less if you try supporting (splinting) your chest. Try one of these when you cough: ? Hold a pillow against your chest. ? Place both hands flat on top of your cut.  Use an incentive spirometer as told by your doctor. This is a tool that measures how well you can fill your lungs with each breath.  Do lung therapy (pulmonary rehabilitation) as told. Medicines  Take over-the-counter or prescription medicines only as told by your doctor.  If you have pain, take pain-relieving medicine before your pain gets very bad. Doing this will help you breathe and cough more comfortably.  If you were prescribed an antibiotic medicine, take it as told by your doctor. Do not stop taking the antibiotic even if you start to feel better. Activity  Ask your doctor what activities are safe for you.  Avoid activities that use your chest muscles for 3-4 weeks or longer.  Do not lift anything that is heavier than 10 lb (4.5 kg), or the limit that your doctor tells you, until he or she says that it is safe. Cut (incision) care  Follow instructions from your doctor about how to take care of your cut(s) from surgery. Make sure you: ? Wash your hands with soap and water  before you change your bandage (dressing). If you cannot use soap and water, use hand sanitizer. ? Change your bandage as told by your doctor. ? Leave stitches (sutures), skin glue, or skin tape (adhesive) strips in place. They may need to stay in place for 2 weeks or longer. If tape strips get loose and curl up, you may trim the loose edges. Do not remove tape strips completely unless your doctor says it is okay.  Keep your bandage dry until it has been removed.  Every day, check the area around your cut(s) for signs of infection. Check for: ? Redness, swelling, or pain. ? Fluid or blood. ? Warmth. ? Pus or a bad smell. Bathing  Do not take baths, swim, or use a hot tub until your doctor approves. You may take showers.  After your bandage is removed, use soap and water to gently wash the your cut(s) from surgery. Do not use anything else to clean your cut(s) unless your doctor tells you to do this. Driving   Do not drive until your doctor approves.  Do not drive or use heavy machinery while taking prescription pain medicine. Eating and drinking  Eat a healthy diet as told by your doctor. A healthy diet includes: ? Lots of fresh fruits and vegetables. ? Whole grains. ? Low-fat (lean) proteins.  Limit foods that are high in fat and processed sugars. These include fried and sweet foods.  Drink enough fluid to keep your pee (urine) clear or light yellow. General instructions     To prevent or treat trouble pooping while you are taking prescription pain medicine, your doctor may recommend that you: ? Take over-the-counter or prescription medicines. ? Eat foods that have a lot of fiber. These include beans, fresh fruits and vegetables, and whole grains.  Do not use any products that contain nicotine or tobacco. These include cigarettes and e-cigarettes. If you need help quitting, ask your doctor.  Avoid being where people are smoking (avoid secondhand smoke).  Wear compression  stockings as told by your doctor. These stockings help you: ? Not get blood clots in your legs. ? Have less swelling in your legs.  If you have a chest tube, care for it as told by your doctor.  Do not travel by airplane during the 2 weeks after your chest tube is removed, or until your doctor says that this is safe.  Keep all follow-up visits as told by your doctor. This is important. Contact a doctor if:  You have redness, swelling, or pain around a cut from surgery.  You have fluid or blood coming from a cut from surgery.  Your cut(s) from surgery feel warm to the touch.  You have pus or a bad smell coming from a cut from surgery.  You have a fever or chills.  You feel sick to your stomach (nauseous).  You throw up (vomit).  You have pain that does not get better with medicine. Get help right away if:  You have chest pain.  Your heart is fluttering or beating fast.  You start to have a rash.  You have shortness of breath.  You have trouble breathing.  You are confused.  You have trouble talking or understanding.  You feel weak, light-headed, or dizzy.  You faint. Summary  To help prevent lung infection (pneumonia), take deep breaths or do breathing exercises as told by your doctor.  Cough often. This is important for clearing chest fluid (phlegm). You can make coughing hurt less if you hold a pillow to your chest or put your hands flat on the cut(s) when you cough (do splinting).  Do not drive until your doctor approves.  Every day, check your cut(s) for signs of infection. These signs can be redness, swelling, pain, fluid, blood, warmth, pus, or a bad smell.  Eat a healthy diet. This includes lots of fresh fruits and vegetables, whole grains, and low-fat (lean) proteins. This information is not intended to replace advice given to you by your health care provider. Make sure you discuss any questions you have with your health care provider. Document  Released: 09/13/2012 Document Revised: 05/01/2017 Document Reviewed: 04/28/2016 Elsevier Patient Education  2020 [employer]   

## 2019-06-01 NOTE — Progress Notes (Signed)
CRITICAL LAB VALUE: POTASSIUM - 6.3  Hall DO notified.  Pt is currently asymptomatic upon assessment.  Orders to repeat Potassium lab value.  Pt will continue to be monitored.

## 2019-06-01 NOTE — Progress Notes (Addendum)
PROGRESS NOTE  AUDIE STAYER PZW:[id number] DOB: [birth year] DOA: 05/08/2019 PCP: [name], MD  HPI/Recap of past 24 hours: [name] a 63 y.o.malewith medical history significant ofsolitary left kidney, CKD stage IV, left staghorn calculus SP stenting, urethral stricture SP urethroplasty, schizophrenia, clinically diagnosed COPD, depression.  Patient was admitted at [facility] between February 10, 2019 and March 02, 2019. Patient was treated for pyelonephritis and retroperitoneal fluid collection concerning for abscess. Urine culture grew MDR E. coli. Patient underwent aspiration of the abscess on 9/17 with JP drain placement. Patient was started on IV imipenem with a PICC line. Summary ofhisactive problems in the hospital is as following.Underwent left percutaneous nephrolithotomy 03/28/2019 and ureteral stent placement at the same time. Underwent laser lithotripsy and basket extraction on 04/05/2019. Patient was recommended to continue the antibiotics until April 16, 2019.  Patient presented to [facility] on 05/11/2019 with shortness of breath left leg pain and fever and nausea and vomiting. CT scan showed evidence of stone on the left side. Patient was initially placed on the BiPAP. His oxygenation worsened and patient actually required intubation in the emergency department. Urology was consulted and patient underwent urgent cystoscopy and left ureteral stent placement. Patient was started on broad-spectrum antibiotics and his blood cultures came back positive for MRSA. Patient was extubated on 05/20/2019 and since then has remained on nasal cannula. Patient's PICC line was removed on 05/12/2019 and a central line was placed which was removed on 05/18/2019. Foley catheter was inserted on 05/11/2019 after the urology procedure and removed on 05/18/2019. TEE showed no evidence of endocarditis but the patient does have evidence of septic emboli with cavitary lesions  on bilateral lung parenchyma therefore patient is treated as infective endocarditis. 05/19/2019 left thoracentesis/pleural fluid culture came back positive for MRSA. Report showed loculated effusion post procedure. 05/20/2019 had pyomyositis underwent right-sided drain placement after incision and debridement. -Due to patient's complaint of back pain MRI thoracic and lumbar spine were performed on 05/21/2019 which showed evidence of epidural abscess as well as facet joint infection. [facility] neurosurgery was consulted who recommended conservative management. Dr. [name] was on call. -Due to worsening shortness of breath CT chest performed on 05/22/2019 showed evidence of recollection of pleural fluid. Chest tube was inserted by PCCM on 05/22/2019. -VATS 12/23 - tolerated well; on Ceftaroline now. Clinically improving.  Hemoglobin 12/29 8.5 from 6.7 post 1 unit PRBC transfusion 12/28.    06/01/19: Seen and examined.  Reports feeling short winded with movement.  Anasarca noted on exam.  Worsening renal function.  Nephrology consulted.    Assessment/Plan: Principal Problem:   Empyema of pleural space (HCC) Active Problems:   Schizophrenia (HCC)   Hypothyroidism   Severe sepsis (HCC)   Pyohydronephrosis   Kidney stone   Chronic kidney disease, stage IV (severe) (HCC)   COPD (chronic obstructive pulmonary disease) (HCC)   Atrial flutter (HCC)   MRSA bacteremia   Septic embolism (HCC)   Epidural abscess   ETOH abuse  Resolving severe sepsiswith septic shock secondary to disseminated MRSA infection thought to have stemmed from a tunneled PICC line that was not maintained/removed following 8-week course of IV antibiotics, septic shock physiology is resolved, POA Pyomyositis SP right-sided flank drain placement POA Left-sided empyema SP thoracentesis/VATS POA. And now chest tube placement Epidural abscess as well as L4-L5 infection - Blood cultures came back positive for MRSA; Urine culture came  back positive for VRE. 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 - TCTS following - VATS with decortication on 05/24/2019 w/ 2 chest tubes, now removed on 05/31/2019.  Pain controlled.  Continue flutter valve and incentive spirometer.  Out of bed to chair as recommended by CTS. - Urology was initially consulted as well which currently has signed off as below. - Neurosurgery at [facility] with Dr. [name] aware who recommends conservative management with IV antibiotics as long as the patient does not have any symptoms (remains AOx4) -Infectious disease following, patient currently transitioned to ceftaroline, to finish total of 6 weeks,  So far repeat blood cultures 12/23/negative to date. Will follow up with Dr. Delaine Lame, [place] outpatient. Independently viewed chest x-ray done on 06/01/2019 shows mild increase in pulmonary vascularity and left pleural effusion.  Anasarca Diffuse edema with worsening renal function Holding off IV fluids  nephrology consulted.  AKI on CKD 4 in the setting of solitary left kidney Baseline creatinine appears to be 2.3 with GFR in the 28 Creatinine worsening 3.2 with GFR of 19 Continue to avoid nephrotoxins Fluid on hold due to anasarca Urine output decent Renal ultrasound done on 05/23/2019 showed mild left hydronephrosis/pelviectasis, post right nephrectomy, mild bladder wall thickening. Continue to monitor urine output Continue daily BMPs Nephrology consulted  Refractory hyperkalemia in the setting of worsening renal function Potassium 5.4 Treated with calcium gluconate IV 1 g once Lokelma 1 dose. Repeat BMP at 1400.  Obstructing/infected renal calculus, Concurrent VRE UTI, likely POA SP emergency ureteral stent placement - Cystoscopy on 05/11/2019 and also was found to have a bulbar urethral stricture.  - ID following, patient  currently on ceftaroline per ID - The stricture was dilated.  - The stent was placed in the left ureter.  - The urine culture on 05/11/2019 was negative. A repeat urine culture was sent on 05/16/2019 and that is positive for VRE - Urology no longer following - call back if needed  A. fib/flutter with rapid ventricular rate, resolved Rate is currently controlled on p.o. Lopressor 25 mg twice daily - Anticoagulation not recommended due to history of fall - Echo with normal LV function Continue to monitor on telemetry  Acute hypoxic respiratory failure likely in the setting of left empyema and pulmonary edema O2 saturation 100% on 2 L Continue pulmonary toilet, incentive spirometer, flutter valve, and IV antibiotics Independently viewed chest x-ray done on 06/01/2019 which showed mild increase in pulmonary vascularity and left pleural effusion.  Acute blood loss anemia/iron deficiency anemia/Anemia of chronic disease 2/2 CKD  Post 1 unit PRBC Drop in Hg FOBT -Iron studies suggestive of iron deficiency Continue daily ferrous sulfate  Acute transaminitis Acute hepatitis panel negative CT abdomen and pelvis done on 05/18/2019 without contrast: Multiple low-density lesions again seen within the liver, unchanged, likely cysts. Large exophytic cyst off the left hepatic lobe is stable. Gallbladder unremarkable. LFTs are trending down.  Continue to avoid hepatotoxins.  Alcohol withdrawal syndrome, resolved Out of alcohol withdrawal window.  No sign of alcohol withdrawal.  Non anion gap metabolic acidosis, persistent Chemistry bicarb 19, anion gap 9 Managed by nephrology  Paranoid schizophrenia/chronic anxiety Stable  Physical debility PT OT assessed and recommended SNF CSW assisting with placement. Continue PT OT with assistance and fall precautions.   DVT prophylaxis: Heparin sq TID Code Status: Full Family Communication: None at bedside  Disposition Plan:  Will plan to  DC once CTS and general surgery sign off and patient is hemodynamically stable.  Consultants:   TCTS  Infectious disease  Neurology  General surgery  Nephrology on 06/01/2019.     Objective: Vitals:   05/31/19 1935 05/31/19 2100 05/31/19 2120 06/01/19 0355  BP:  (!) 148/81 (!) 170/90 (!) 153/81  Pulse:  73 87 70  Resp:  16  19  Temp:  (!) 97.5 F (36.4 C)  97.8 F (36.6 C)  TempSrc:  Oral  Oral  SpO2: 100% 97%  95%  Weight:      Height:        Intake/Output Summary (Last 24 hours) at 06/01/2019 0824 Last data filed at 06/01/2019 0600 Gross per 24 hour  Intake --  Output 1080 ml  Net -1080 ml   Filed Weights   05/17/2019 2200  Weight: 90.1 kg    Exam:  . General: 63 y.o. year-old male well-developed well-nourished in no acute distress.  Alert and oriented x3.  Anasarca. . Cardiovascular: Regular rate and rhythm no rubs or gallops.   [name] Respiratory: Mild rales at bases.  No wheezing noted.  Poor respiratory effort. .   Abdomen: Obese bowel sounds present. . Musculoskeletal: Diffuse edema in all 4 extremities.  Psychiatry: Mood is appropriate for condition and setting.   Data Reviewed: CBC: Recent Labs  Lab 05/28/19 0404 05/29/19 0231 05/29/19 0839 05/30/19 0230 05/30/19 1529 05/31/19 1020 06/01/19 0313  WBC 10.9* 10.7*  --  8.1  --  7.8 8.5  HGB 7.2* 7.7*  --  6.7* 8.5* 8.1* 7.7*  HCT 23.8* 24.9* 20.8* 21.8* 27.1* 25.2* 24.7*  MCV 96.4 95.0  --  95.2  --  92.6 91.8  PLT 328 357  --  340  --  283 176   Basic Metabolic Panel: Recent Labs  Lab 05/28/19 0404 05/29/19 0231 05/29/19 1802 05/30/19 0230 05/31/19 1020 06/01/19 0313  NA 138 136  --  135 137 135  K 5.0 5.5* 4.9 4.7 5.0 5.4*  CL 113* 112*  --  108 110 107  CO2 19* 19*  --  20* 19* 19*  GLUCOSE 85 82  --  103* 103* 87  BUN 38* 42*  --  43* 44* 54*  CREATININE 2.35* 2.34*  --  2.32* 2.69* 3.26*  CALCIUM 7.8* 8.2*  --  8.1* 8.2* 8.1*   GFR: Estimated Creatinine Clearance: 25.1  mL/min (A) (by C-G formula based on SCr of 3.26 mg/dL (H)). Liver Function Tests: Recent Labs  Lab 05/28/19 0404 05/29/19 0231 05/30/19 0230 05/31/19 1020 06/01/19 0313  AST 84* 64* 145* 47* 38  ALT 51* 51* 116* 68* 57*  ALKPHOS 252* 267* 551* 384* 353*  BILITOT 0.5 0.6 0.6 0.3 0.7  PROT 5.1* 5.3* 5.3* 5.6* 5.6*  ALBUMIN 1.1* 1.1* 1.1* 1.2* 1.2*   No results for input(s): LIPASE, AMYLASE in the last 168 hours. No results for input(s): AMMONIA in the last 168 hours. Coagulation Profile: Recent Labs  Lab 05/16/2019 1400  INR 1.1   Cardiac Enzymes: No results for input(s): CKTOTAL, CKMB, CKMBINDEX, TROPONINI in the last 168 hours. BNP (last 3 results) No results for input(s): PROBNP in the last 8760 hours. HbA1C: No results for input(s): HGBA1C in the last 72 hours. CBG: Recent Labs  Lab 05/31/19 0630 05/31/19 1220 05/31/19 1558 05/31/19 2113 06/01/19 0612  GLUCAP 85 130* 83 114* 84   Lipid Profile: No results for input(s): CHOL, HDL, LDLCALC, TRIG, CHOLHDL, LDLDIRECT in the last 72 hours. Thyroid Function Tests: No results for input(s): TSH, T4TOTAL, FREET4, T3FREE, THYROIDAB in the  last 72 hours. Anemia Panel: Recent Labs    05/29/19 0839  VITAMINB12 704  FERRITIN 406*  TIBC 132*  IRON 12*   Urine analysis:    Component Value Date/Time   COLORURINE YELLOW 05/26/2019 0245   APPEARANCEUR HAZY (A) 05/26/2019 0245   APPEARANCEUR Cloudy (A) 12/01/2018 0927   LABSPEC 1.012 05/26/2019 0245   LABSPEC 1.010 02/16/2014 1650   PHURINE 5.0 05/26/2019 0245   GLUCOSEU NEGATIVE 05/26/2019 0245   GLUCOSEU Negative 02/16/2014 1650   HGBUR LARGE (A) 05/26/2019 0245   BILIRUBINUR NEGATIVE 05/26/2019 0245   BILIRUBINUR Negative 12/01/2018 0927   BILIRUBINUR Negative 02/16/2014 1650   KETONESUR NEGATIVE 05/26/2019 0245   PROTEINUR 30 (A) 05/26/2019 0245   UROBILINOGEN 0.2 11/07/2012 1049   NITRITE NEGATIVE 05/26/2019 0245   LEUKOCYTESUR LARGE (A) 05/26/2019 0245    LEUKOCYTESUR 3+ 02/16/2014 1650   Sepsis Labs: @LABRCNTIP (procalcitonin:4,lacticidven:4)  ) Recent Results (from the past 240 hour(s))  Aerobic/Anaerobic Culture (surgical/deep wound)     Status: None   Collection Time: 05/04/2019  3:41 PM   Specimen: PATH Cytology Pleural fluid; Body Fluid  Result Value Ref Range Status   Specimen Description FLUID LEFT PLEURAL  Final   Special Requests PT ON ANCEF  Final   Gram Stain NO WBC SEEN NO ORGANISMS SEEN   Final   Culture   Final    No growth aerobically or anaerobically. Performed at [facility], [place] [address] [place], [place] [address]    Report Status 05/30/2019 FINAL  Final  Aerobic/Anaerobic Culture (surgical/deep wound)     Status: None   Collection Time: 06/02/2019  3:49 PM   Specimen: PATH Cytology Pleural fluid; Body Fluid  Result Value Ref Range Status   Specimen Description FLUID PLEURAL LEFT  Final   Special Requests NO 2 PT ON ANCEF  Final   Gram Stain   Final    RARE WBC PRESENT,BOTH PMN AND MONONUCLEAR NO ORGANISMS SEEN    Culture   Final    No growth aerobically or anaerobically. Performed at [facility], [place] [address], [place], [place] [address]    Report Status 05/30/2019 FINAL  Final      Studies: No results found.  Scheduled Meds: . bisacodyl  10 mg Oral Daily  . Chlorhexidine Gluconate Cloth  6 each Topical Daily  . feeding supplement (ENSURE ENLIVE)  237 mL Oral TID BM  . feeding supplement (PRO-STAT SUGAR FREE 64)  30 mL Oral BID  . ferrous sulfate  325 mg Oral Q breakfast  . heparin injection (subcutaneous)  5,000 Units Subcutaneous Q8H  . insulin aspart  0-24 Units Subcutaneous TID AC & HS  . ipratropium-albuterol  3 mL Nebulization BID  . levothyroxine  50 mcg Oral QAC breakfast  . metoprolol tartrate  25 mg Oral BID  . multivitamin with minerals  1 tablet Oral Daily  . nicotine  21 mg Transdermal Daily  . senna-docusate  1 tablet Oral QHS    Continuous Infusions: . sodium  chloride    . calcium gluconate 1,000 mg (06/01/19 0757)  . ceftaroline (TEFLARO) 400 mg IVPB       LOS: 8 days     Kayleen Memos, MD [facility] Pager [phone]  If 7PM-7AM, please contact night-coverage [email or web address] Password [email or web address] 06/01/2019, 8:24 AM

## 2019-06-01 NOTE — Progress Notes (Addendum)
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 Treated with argon plasma coagulation (APC) in 2016.  Also hx of internal hemorrhoids.  Started IV Protonix 40 mg BID.  Will continue to monitor H&H.  If Hg continues to drop will consider GI consult.    Additionally, possible refractory hyperkalemia 6.3 from 5.4 this AM on repeated BMP this afternoon after treatment with Lokelma.  Also received IV calcium gluconate 1 g once.  Repeating serum potassium to verify this value and obtaining 12 lead EKG.  Patient is asymptomatic.  Will continue to follow and treat accordingly.

## 2019-06-01 NOTE — Progress Notes (Signed)
[facility] Progress Note  7 Days Post-Op  Subjective: CC-  Sitting up in bed. RLQ drain is serous, 80cc out last 24 hours. States that this area is sore if he leans on his right side, otherwise no pain.  Objective: Vital signs in last 24 hours: Temp:  [97.5 F (36.4 C)-98.3 F (36.8 C)] 97.8 F (36.6 C) (12/30 0355) Pulse Rate:  [70-87] 75 (12/30 1052) Resp:  [15-28] 19 (12/30 0355) BP: (147-170)/(80-90) 155/80 (12/30 1052) SpO2:  [95 %-100 %] 95 % (12/30 0849) Last BM Date: 05/30/19  Intake/Output from previous day: 12/29 0701 - 12/30 0700 In: -  Out: 1080 [Urine:1000; Drains:80] Intake/Output this shift: No intake/output data recorded.  PE: Gen:  Alert, NAD HEENT: EOM's intact, pupils equal and round Pulm:  Rate and effort normal Abd: Soft, NT/ND, +BS, R flank incision cdi with staples intact and no erythema or drainage, JP drain with serous drainage Skin: no rashes noted, warm and dry  Lab Results:  Recent Labs    05/31/19 1020 06/01/19 0313  WBC 7.8 8.5  HGB 8.1* 7.7*  HCT 25.2* 24.7*  PLT 283 282   BMET Recent Labs    05/31/19 1020 06/01/19 0313  NA 137 135  K 5.0 5.4*  CL 110 107  CO2 19* 19*  GLUCOSE 103* 87  BUN 44* 54*  CREATININE 2.69* 3.26*  CALCIUM 8.2* 8.1*   PT/INR No results for input(s): LABPROT, INR in the last 72 hours. CMP     Component Value Date/Time   NA 135 06/01/2019 0313   NA 139 09/19/2014 1132   K 5.4 (H) 06/01/2019 0313   K 3.7 09/19/2014 1132   CL 107 06/01/2019 0313   CL 110 09/19/2014 1132   CO2 19 (L) 06/01/2019 0313   CO2 25 09/19/2014 1132   GLUCOSE 87 06/01/2019 0313   GLUCOSE 72 09/19/2014 1132   BUN 54 (H) 06/01/2019 0313   BUN 39 (H) 09/19/2014 1132   CREATININE 3.26 (H) 06/01/2019 0313   CREATININE 2.30 (H) 09/19/2014 1132   CALCIUM 8.1 (L) 06/01/2019 0313   CALCIUM 8.9 09/19/2014 1132   PROT 5.6 (L) 06/01/2019 0313   PROT 6.8 09/19/2014 1132   ALBUMIN 1.2 (L) 06/01/2019 0313   ALBUMIN 3.5 09/19/2014 1132   AST 38 06/01/2019 0313   AST 24 09/19/2014 1132   ALT 57 (H) 06/01/2019 0313   ALT 15 (L) 09/19/2014 1132   ALKPHOS 353 (H) 06/01/2019 0313   ALKPHOS 108 09/19/2014 1132   BILITOT 0.7 06/01/2019 0313   BILITOT 0.4 09/19/2014 1132   GFRNONAA 19 (L) 06/01/2019 0313   GFRNONAA 30 (L) 09/19/2014 1132   GFRAA 22 (L) 06/01/2019 0313   GFRAA 34 (L) 09/19/2014 1132   Lipase     Component Value Date/Time   LIPASE 16 02/02/2016 1733   LIPASE 111 05/05/2012 1359       Studies/Results: DG Chest Port 1 View  Result Date: 06/01/2019 CLINICAL DATA:  Chest tube removal, shortness of breath EXAM: PORTABLE CHEST 1 VIEW COMPARISON:  05/31/2019 FINDINGS: Interval removal of left chest tube. No pneumothorax. Small residual left pleural effusion. Left base atelectasis and mild vascular congestion. Right lung clear. No acute bony abnormality. IMPRESSION: Interval removal of left chest tube without pneumothorax. Small left effusion with left base atelectasis. Electronically Signed   By: Rolm Baptise M.D.   On: 06/01/2019 08:35   DG Chest Port 1 View  Result Date: 05/31/2019 CLINICAL DATA:  Chest tube EXAM:  PORTABLE CHEST 1 VIEW COMPARISON:  Yesterday FINDINGS: Left chest tube in stable position. No visible pneumothorax or increasing pleural fluid/thickening. There is still volume loss and hazy density on the left. Low volume but relatively clear right lung. Stable generous heart size accentuated by mediastinal fat by recent CT. IMPRESSION: Unchanged residual pleural fluid and/or thickening on the left. Stable left chest tube positioning. Electronically Signed   By: Monte Fantasia M.D.   On: 05/31/2019 07:27    Anti-infectives: Anti-infectives (From admission, onward)   Start     Dose/Rate Route Frequency Ordered Stop   06/01/19 1400  ceftaroline (TEFLARO) 300 mg in sodium chloride 0.9 % 250 mL IVPB     300 mg 125 mL/hr over 120 Minutes Intravenous Every 8 hours  06/01/19 0818     05/28/19 1400  ceftaroline (TEFLARO) 400 mg in sodium chloride 0.9 % 250 mL IVPB  Status:  Discontinued     400 mg 125 mL/hr over 120 Minutes Intravenous Every 8 hours 05/28/19 1009 06/01/19 0819   05/26/19 1400  ceftaroline (TEFLARO) 300 mg in sodium chloride 0.9 % 250 mL IVPB  Status:  Discontinued     300 mg 125 mL/hr over 120 Minutes Intravenous Every 8 hours 05/26/19 0731 05/28/19 1009   05/30/2019 2200  ceftaroline (TEFLARO) 400 mg in sodium chloride 0.9 % 250 mL IVPB  Status:  Discontinued     400 mg 250 mL/hr over 60 Minutes Intravenous Every 8 hours 05/24/2019 1050 05/26/19 0731   05/06/2019 1342  ceFAZolin (ANCEF) IVPB 2g/100 mL premix     2 g 200 mL/hr over 30 Minutes Intravenous 30 min pre-op 05/20/2019 1342 05/11/2019 1454   05/07/2019 2245  ceftaroline (TEFLARO) 600 mg in sodium chloride 0.9 % 250 mL IVPB  Status:  Discontinued     600 mg 250 mL/hr over 60 Minutes Intravenous Every 12 hours 05/16/2019 2231 05/23/2019 2239   05/23/2019 2245  linezolid (ZYVOX) IVPB 600 mg  Status:  Discontinued     600 mg 300 mL/hr over 60 Minutes Intravenous Every 12 hours 05/31/2019 2231 05/08/2019 1050   05/14/2019 2245  ceftaroline (TEFLARO) 400 mg in sodium chloride 0.9 % 250 mL IVPB  Status:  Discontinued     400 mg 250 mL/hr over 60 Minutes Intravenous Every 12 hours 05/05/2019 2239 05/28/2019 1050       Assessment/Plan Pyohydronephrosis due to obstructing calculous A. Fib/flutter EtOH abuse AKI on CKD stage IV; creatinine 3.26 Hypothyroidism Paranoid Schizophrenia Anemia; H/H: 7.7/24.7  Disseminated MRSA Bacteremia- abx per primary  Empyema- s/pleft VATS, LLL wedge resection, drainage empyema 12/23 Dr. Orvan Seen. This was the reason for his transfer to [facility] Epidural abscess- per primary,[facility]  Right flank myositis with abscess - s/p incision and drainage 12/18 Dr. Christian Mate at [facility] - cx with MRSA, resistant to  cipro/oxacillin/erythromycin - drain with80ccof serous appearing drainage - follow drain output, will consider removing once output down and if it remains serous. -   FEN:HH diet VTE: SCDs, sq heparin ID:  ceftaroline  12/24>> Follow UX:[id number] at [facility] after discharge  Plan: Continue drain and staples for now. Will see again 06/05/18 to consider removal. If drain output low and serous likely can just pull the drain, otherwise may want to consider repeat CT scan prior to removal.   LOS: 8 days    Wellington Hampshire, [facility] 06/01/2019, 11:05 AM Please see Amion for pager number during day hours 7:00am-4:30pm

## 2019-06-01 NOTE — Progress Notes (Signed)
PHARMACY NOTE:  ANTIMICROBIAL RENAL DOSAGE ADJUSTMENT  Current antimicrobial regimen includes a mismatch between antimicrobial dosage and estimated renal function.  As per policy approved by the Pharmacy & Therapeutics and Medical Executive Committees, the antimicrobial dosage will be adjusted accordingly.  Current antimicrobial dosage:  ceftaroline 400 mg IV every 8 hours   Indication: disseminated MRSA infection   Renal Function: improving, Creatinine has been trending down and urine output has been increasing.   Estimated Creatinine Clearance: 25.1 mL/min (A) (by C-G formula based on SCr of 3.26 mg/dL (H)).     Antimicrobial dosage has been changed to:  ceftaroline 300 mg IV every 8 hours    Thank you for allowing pharmacy to be a part of this patient's care.  Nicoletta Dress, PharmD PGY2 Infectious Disease Pharmacy Resident   Please check amion for clinical pharmacist contact number   06/01/2019 8:17 AM

## 2019-06-01 NOTE — Consult Note (Signed)
[place] Date: 05/11/2019 06/01/2019 Rexene Agent Requesting Physician:  Nevada Crane DO  Reason for Consult:  AoCKD3, Hyperkalemia HPI:  8M with recent complicated and chronically complicated medical history.    Patient's past history includes longstanding CKD, schizophrenia, solitary kidney, COPD, depression.  Patient was treated at [facility] for much of September of this year for pyelonephritis with abscess and a drug-resistant E. coli requiring long-term imipenem.  He had a staghorn stone.  He subsequently underwent shockwave lithotripsy on 04/05/2019.  He then represented to [facility] on 12/9 and was diagnosed with disseminated MRSA including septic emboli, epidural abscess, empyema, pyomyositis.  He has been treated with several antibiotics and currently is receiving ceftaroline with guidance from ID.  He transferred from [facility] to [facility] because of the concern for empyema and underwent VATS procedure on 12/3 with a decortication and left lower lobe wedge resection with drainage of empyema.  At [facility] the patient required left ureteral stent because of recurrent stone with obstruction.  It appears that the stent is still present.  He currently is using a condom catheter.  It appears he has been using this since 12/25.  Creatinine has been fairly stable between 2.3 and 2.7.  Today was 3.3, confirmed on repeat.  Urine output yesterday was 1 L, 1.7 L the day before that.  No recent exposure to IV contrast, NSAIDs.  He has had mild hyperkalemia intermittently requiring Lokelma.  Currently he only complains of left knee pain which is chronic.  Otherwise he feels well.  He does not feel any tenderness in the suprapubic region.  Creatinine (mg/dL)  Date Value  09/19/2014 2.30 (H)  02/16/2014 2.82 (H)  01/15/2014 2.95 (H)  12/06/2013 2.30 (H)  12/05/2013 2.50 (H)  08/16/2013 2.43 (H)  07/14/2013 2.78 (H)  05/20/2013 2.61 (H)  12/22/2012 2.66 (H)   12/08/2012 3.06 (H)   Creatinine, Ser (mg/dL)  Date Value  06/01/2019 3.29 (H)  06/01/2019 3.26 (H)  05/31/2019 2.69 (H)  05/30/2019 2.32 (H)  05/29/2019 2.34 (H)  05/28/2019 2.35 (H)  05/27/2019 2.54 (H)  05/26/2019 2.71 (H)  05/29/2019 2.62 (H)  05/20/2019 2.51 (H)  ] I/Os: I/O last 3 completed shifts: In: -  Out: 1570 [Urine:1475; Drains:95]   ROS NSAIDS: no exposure IV Contrast no exposure TMP/SMX no exposure Hypotension not present Balance of 12 systems is negative w/ exceptions as above  PMH  Past Medical History:  Diagnosis Date  . Asthma   . COPD (chronic obstructive pulmonary disease) ([place])   . Depression   . External hemorrhoids   . Gastric ulcer   . GERD (gastroesophageal reflux disease)   . Hydronephrosis   . Hypothyroidism    hypo  . Insomnia   . Mental disorder   . Myocardial infarction ([place])   . Paranoid schizophrenia ([place])   . Renal disorder    renal failure  . Seizures ([place])   . Stroke [facility])    [place]  Past Surgical History:  Procedure Laterality Date  . APPENDECTOMY    . COLONOSCOPY WITH PROPOFOL N/A 12/07/2014   Procedure: COLONOSCOPY WITH PROPOFOL;  Surgeon: Manya Silvas, MD;  Location: [facility] ENDOSCOPY;  Service: Endoscopy;  Laterality: N/A;  . COLONOSCOPY WITH PROPOFOL N/A 10/10/2016   Procedure: COLONOSCOPY WITH PROPOFOL;  Surgeon: Lucilla Lame, MD;  Location: [place];  Service: Endoscopy;  Laterality: N/A;  . CYSTOSCOPY W/ URETERAL STENT PLACEMENT Left 01/21/2019   Procedure: CYSTOSCOPY WITH RETROGRADE PYELOGRAM/URETERAL STENT PLACEMENT;  Surgeon: Abbie Sons, MD;  Location: [facility] ORS;  Service: Urology;  Laterality: Left;  . CYSTOSCOPY WITH URETEROSCOPY AND STENT PLACEMENT Left 05/11/2019   Procedure: CYSTOSCOPY WITH URETEROSCOPY AND STENT PLACEMENT, LEFT;  Surgeon: Hollice Espy, MD;  Location: [facility] ORS;  Service: Urology;  Laterality: Left;  . ESOPHAGOGASTRODUODENOSCOPY N/A 12/07/2014   Procedure:  ESOPHAGOGASTRODUODENOSCOPY (EGD);  Surgeon: Manya Silvas, MD;  Location: [facility] ENDOSCOPY;  Service: Endoscopy;  Laterality: N/A;  . INCISION AND DRAINAGE ABSCESS Right 05/20/2019   Procedure: INCISION AND DRAINAGE ABSCESS, Right flank.;  Surgeon: Ronny Bacon, MD;  Location: [facility] ORS;  Service: General;  Laterality: Right;  . NEPHRECTOMY    . POLYPECTOMY  10/10/2016   Procedure: POLYPECTOMY;  Surgeon: Lucilla Lame, MD;  Location: [place];  Service: Endoscopy;;  . SUPRAPUBIC CATHETER INSERTION    . TEE WITHOUT CARDIOVERSION N/A 05/18/2019   Procedure: TRANSESOPHAGEAL ECHOCARDIOGRAM (TEE);  Surgeon: Nelva Bush, MD;  Location: [facility] ORS;  Service: Cardiovascular;  Laterality: N/A;  . VIDEO ASSISTED THORACOSCOPY (VATS)/DECORTICATION Left 05/20/2019   Procedure: LEFT VIDEO ASSISTED THORACOSCOPY (VATS)/DECORTICATION; LEFT LOWER LOBE WEDGE RESECTION; DRAINAGE OF EMPYEMA;  Surgeon: Wonda Olds, MD;  Location: [facility] OR;  Service: Thoracic;  Laterality: Left;   FH  Family History  Problem Relation Age of Onset  . Stroke Father   . Pneumonia Father   . Brain cancer Mother    SH  reports that he has been smoking cigarettes. He has a 55.00 pack-year smoking history. He has quit using smokeless tobacco.  His smokeless tobacco use included chew. He reports that he does not drink alcohol or use drugs. Allergies  Allergies  Allergen Reactions  . Mellaril [Thioridazine] Shortness Of Breath and Swelling  . Buprenorphine Itching  . Morphine And Related Itching  . Navane [Thiothixene] Other (See Comments)    Reaction:  GI upset   . Thorazine [Chlorpromazine] Other (See Comments)    Reaction:  Dizziness/fainting    Home medications Prior to Admission medications   Medication Sig Start Date End Date Taking? Authorizing Provider  ceftaroline 400 mg in sodium chloride 0.9 % 250 mL Inject 400 mg into the vein every 12 (twelve) hours. 05/19/2019   Lavina Hamman, MD  feeding  supplement, ENSURE ENLIVE, (ENSURE ENLIVE) LIQD Take 237 mLs by mouth 3 (three) times daily between meals. 05/28/2019   Lavina Hamman, MD  ipratropium-albuterol (DUONEB) 0.5-2.5 (3) MG/3ML SOLN Take 3 mLs by nebulization 3 (three) times daily. 05/05/2019   Lavina Hamman, MD  levothyroxine (SYNTHROID, LEVOTHROID) 50 MCG tablet Take 50 mcg by mouth daily before breakfast.    [provider]  linezolid (ZYVOX) 600 MG/300ML IVPB Inject 300 mLs (600 mg total) into the vein every 12 (twelve) hours. 05/29/2019   Lavina Hamman, MD  LORazepam (ATIVAN) 2 MG/ML injection Inject 0.25 mLs (0.5 mg total) into the vein every 6 (six) hours as needed for anxiety. 05/03/2019   Lavina Hamman, MD  metoprolol tartrate (LOPRESSOR) 25 MG tablet Take 1 tablet (25 mg total) by mouth 2 (two) times daily. 05/15/2019   Lavina Hamman, MD  Multiple Vitamin (MULTIVITAMIN) LIQD Take 15 mLs by mouth daily. 05/24/2019   Lavina Hamman, MD  nicotine (NICODERM CQ - DOSED IN MG/24 HOURS) 21 mg/24hr patch Place 1 patch (21 mg total) onto the skin daily. 05/26/2019   Lavina Hamman, MD  oxyCODONE-acetaminophen (PERCOCET/ROXICET) 5-325 MG tablet Take 1 tablet by mouth every 4 (four) hours as needed for  moderate pain or severe pain. 05/15/2019   Lavina Hamman, MD    Current Medications Scheduled Meds: . bisacodyl  10 mg Oral Daily  . Chlorhexidine Gluconate Cloth  6 each Topical Daily  . feeding supplement (ENSURE ENLIVE)  237 mL Oral TID BM  . feeding supplement (PRO-STAT SUGAR FREE 64)  30 mL Oral BID  . ferrous sulfate  325 mg Oral Q breakfast  . heparin injection (subcutaneous)  5,000 Units Subcutaneous Q8H  . insulin aspart  0-24 Units Subcutaneous TID AC & HS  . ipratropium-albuterol  3 mL Nebulization BID  . levothyroxine  50 mcg Oral QAC breakfast  . metoprolol tartrate  25 mg Oral BID  . multivitamin with minerals  1 tablet Oral Daily  . nicotine  21 mg Transdermal Daily  . pantoprazole (PROTONIX) IV  40 mg  Intravenous Q12H  . senna-docusate  1 tablet Oral QHS  . sodium bicarbonate  1,300 mg Oral BID   Continuous Infusions: . sodium chloride    . ceftaroline (TEFLARO) 400 mg IVPB 300 mg (06/01/19 1416)   PRN Meds:.Place/Maintain arterial line **AND** sodium chloride, acetaminophen **OR** acetaminophen, fentaNYL (SUBLIMAZE) injection, ipratropium-albuterol, LORazepam, ondansetron (ZOFRAN) IV, oxyCODONE-acetaminophen, polyethylene glycol  CBC Recent Labs  Lab 05/30/19 0230 05/30/19 1529 05/31/19 1020 06/01/19 0313  WBC 8.1  --  7.8 8.5  HGB 6.7* 8.5* 8.1* 7.7*  HCT 21.8* 27.1* 25.2* 24.7*  MCV 95.2  --  92.6 91.8  PLT 340  --  283 683   Basic Metabolic Panel Recent Labs  Lab 05/27/19 0325 05/28/19 0404 05/29/19 0231 05/29/19 1802 05/30/19 0230 05/31/19 1020 06/01/19 0313 06/01/19 1328  NA 137 138 136  --  135 137 135 134*  K 5.6* 5.0 5.5* 4.9 4.7 5.0 5.4* 6.3*  CL 113* 113* 112*  --  108 110 107 108  CO2 18* 19* 19*  --  20* 19* 19* 18*  GLUCOSE 95 85 82  --  103* 103* 87 118*  BUN 43* 38* 42*  --  43* 44* 54* 60*  CREATININE 2.54* 2.35* 2.34*  --  2.32* 2.69* 3.26* 3.29*  CALCIUM 8.0* 7.8* 8.2*  --  8.1* 8.2* 8.1* 8.0*    Physical Exam  Blood pressure (!) 155/80, pulse 78, temperature 98 F (36.7 C), temperature source Oral, resp. rate (!) 29, height 6' (1.829 m), weight 90.1 kg, SpO2 96 %. GEN: Chronically ill-appearing, in chair ENT: NCAT, poor dentition EYES: EOMI CV: Regular, normal S1 and S2 PULM: Coarse breath sounds bilaterally, bandages across chest wall ABD: Soft, no suprapubic fullness SKIN: No significant rashes EXT: 1-2+ lower extremity edema  Assessment 1M with CKD4, mild AKI, hyperkalemia, in setting of disseminated MRSA, solitary kidney, recent ureteral stent for obstruction by stone  1. Nonoliguric AoCKD4: labile but baseline recently around 2.5.  Likley multifactorial + ? Obstruction.  2. Solitary L kidney, remote nephrectomy 3. Hyperkalemia,  mild 4. Mild metabolic acidosis 5. Nephrolithiasis, current L ureteral stent; hx/o infected stones 6. Disseminated MRSA: empyema (s/p VATS--> decortication and wedge resection), septic emboly, epidural abscess, pyomyositis s/p I&D. ID following. Ceftaroline.  7. Schizophrenia 8. COPD  Plan Very complicated course.  I think we immediately need to rule out obstruction of the ureteral stent, renal ultrasound has been ordered.  Will start daily Lokelma.  I will also restart sodium bicarbonate to treat his metabolic acidosis which will improve his potassium.  Check UA. I do not believe he is hypovolemic.  Will follow along.  Rexene Agent  [phone] pgr 06/01/2019, 3:14 PM

## 2019-06-01 NOTE — Progress Notes (Signed)
      [address]       [place] [address]             [phone]       7 Days Post-Op Procedure(s) (LRB): LEFT VIDEO ASSISTED THORACOSCOPY (VATS)/DECORTICATION; LEFT LOWER LOBE WEDGE RESECTION; DRAINAGE OF EMPYEMA (Left)  Subjective: Patient states breathing intermittently "short".  Objective: Vital signs in last 24 hours: Temp:  [97.5 F (36.4 C)-98.3 F (36.8 C)] 97.8 F (36.6 C) (12/30 0355) Pulse Rate:  [70-87] 70 (12/30 0355) Cardiac Rhythm: Normal sinus rhythm (12/29 1900) Resp:  [15-28] 19 (12/30 0355) BP: (147-170)/(81-90) 153/81 (12/30 0355) SpO2:  [95 %-100 %] 95 % (12/30 0355)     Intake/Output from previous day: 12/29 0701 - 12/30 0700 In: -  Out: 1080 [Urine:1000; Drains:80]   Physical Exam:  Cardiovascular: RRR Pulmonary: Clear to auscultation on the right and slightly diminished left base Abdomen: Soft, non tender, bowel sounds present. Extremities: ++ bilateral lower extremity edema. Wounds: Clean and dry.  No erythema or signs of infection.   Lab Results: CBC: Recent Labs    05/31/19 1020 06/01/19 0313  WBC 7.8 8.5  HGB 8.1* 7.7*  HCT 25.2* 24.7*  PLT 283 282   BMET:  Recent Labs    05/31/19 1020 06/01/19 0313  NA 137 135  K 5.0 5.4*  CL 110 107  CO2 19* 19*  GLUCOSE 103* 87  BUN 44* 54*  CREATININE 2.69* 3.26*  CALCIUM 8.2* 8.1*    PT/INR: No results for input(s): LABPROT, INR in the last 72 hours. ABG:  INR: Will add last result for INR, ABG once components are confirmed Will add last 4 CBG results once components are confirmed  Assessment/Plan:  1. CV - SR with HR in the 70's. On Lopressor 25 mg bid. 2.  Pulmonary - History of COPD. Continue Duo nebs. On room air. CXR this am appears stable. Will order PA/LAT CXR for am. Encourage incentive spirometer. 3. Anemia-H and H this am decreased to 7.7 and 24.7. He had previous transfusion. Continue Ferrous sulfate. 4. History of CKD (stage IV)- Creatinine  this am 3.26. Creatinine upon admission 3.47. 5. ID-On Ceftaroline. No growth aerobically or anaerobically.  6. Hypothyroidism-continue Levothyroxine 50 mcg daily 7. Pyomyositis right sided JP drain. Patient with staples RLQ (s/p Incision and drainage right lower quadrant abdominal intrafascial abscess) 8. Will arrange for follow up appointment;management per primary  Bakary Bramer M ZimmermanPA-C 06/01/2019,7:36 AM [phone]

## 2019-06-01 NOTE — Progress Notes (Signed)
Pt went down for renal ultrasound this evening, but they were unable to perform ultrasound due to PT's labored breathing.  Upon Pt's return to unit he was desating low 70%.  Pt given O2 at 5L and quickly regained O2 saturation at 98%.  Lung ascultation presented mild upper bronchial wheeze. Pt then reduced to 4L and O2 maintained at 96%-98%.  Respiratory Therapy notified and came to assess Pt, nebulizer treatment given.  Pt is currently much more comfortable and continues to maintain O2 at 96%-98%. Pt will continue to be monitored.

## 2019-06-02 ENCOUNTER — Inpatient Hospital Stay (HOSPITAL_COMMUNITY): Payer: Medicare Other

## 2019-06-02 LAB — COMPREHENSIVE METABOLIC PANEL
ALT: 48 U/L — ABNORMAL HIGH (ref 0–44)
AST: 35 U/L (ref 15–41)
Albumin: 1.1 g/dL — ABNORMAL LOW (ref 3.5–5.0)
Alkaline Phosphatase: 294 U/L — ABNORMAL HIGH (ref 38–126)
Anion gap: 9 (ref 5–15)
BUN: 65 mg/dL — ABNORMAL HIGH (ref 8–23)
CO2: 20 mmol/L — ABNORMAL LOW (ref 22–32)
Calcium: 8 mg/dL — ABNORMAL LOW (ref 8.9–10.3)
Chloride: 106 mmol/L (ref 98–111)
Creatinine, Ser: 3.83 mg/dL — ABNORMAL HIGH (ref 0.61–1.24)
GFR calc Af Amer: 18 mL/min — ABNORMAL LOW (ref >60)
GFR calc non Af Amer: 16 mL/min — ABNORMAL LOW (ref >60)
Glucose, Bld: 90 mg/dL (ref 70–99)
Potassium: 5.2 mmol/L — ABNORMAL HIGH (ref 3.5–5.1)
Sodium: 135 mmol/L (ref 135–145)
Total Bilirubin: 0.5 mg/dL (ref 0.3–1.2)
Total Protein: 5.2 g/dL — ABNORMAL LOW (ref 6.5–8.1)

## 2019-06-02 LAB — GLUCOSE, CAPILLARY
Glucose-Capillary: 81 mg/dL (ref 70–99)
Glucose-Capillary: 101 mg/dL — ABNORMAL HIGH (ref 70–99)
Glucose-Capillary: 99 mg/dL (ref 70–99)
Glucose-Capillary: 91 mg/dL (ref 70–99)

## 2019-06-02 LAB — CBC
HCT: 23.5 % — ABNORMAL LOW (ref 39.0–52.0)
Hemoglobin: 7.3 g/dL — ABNORMAL LOW (ref 13.0–17.0)
MCH: 28.6 pg (ref 26.0–34.0)
MCHC: 31.1 g/dL (ref 30.0–36.0)
MCV: 92.2 fL (ref 80.0–100.0)
Platelets: 256 10*3/uL (ref 150–400)
RBC: 2.55 MIL/uL — ABNORMAL LOW (ref 4.22–5.81)
RDW: 14.7 % (ref 11.5–15.5)
WBC: 8.8 10*3/uL (ref 4.0–10.5)
nRBC: 0 % (ref 0.0–0.2)

## 2019-06-02 LAB — SURGICAL PATHOLOGY

## 2019-06-02 MED ORDER — SODIUM CHLORIDE 0.9 % IV SOLN
510.0000 mg | Freq: Once | INTRAVENOUS | Status: AC
  Administered 2019-06-02: 14:00:00 510 mg via INTRAVENOUS
  Filled 2019-06-02: qty 17

## 2019-06-02 NOTE — Progress Notes (Addendum)
      [address]       [place] [address]             [phone]       8 Days Post-Op Procedure(s) (LRB): LEFT VIDEO ASSISTED THORACOSCOPY (VATS)/DECORTICATION; LEFT LOWER LOBE WEDGE RESECTION; DRAINAGE OF EMPYEMA (Left)  Subjective: Patient states breathing is "alright" this am. He is about to be taken downstairs for PA/LAT CXR.  Objective: Vital signs in last 24 hours: Temp:  [98 F (36.7 C)-98.2 F (36.8 C)] 98.2 F (36.8 C) (12/31 0500) Pulse Rate:  [75-91] 75 (12/31 0500) Cardiac Rhythm: Normal sinus rhythm (12/30 1900) Resp:  [11-29] 12 (12/31 0500) BP: (127-155)/(75-83) 135/76 (12/31 0500) SpO2:  [95 %-100 %] 100 % (12/31 0500)     Intake/Output from previous day: 12/30 0701 - 12/31 0700 In: 120 [P.O.:120] Out: 700 [Urine:700]   Physical Exam:  Cardiovascular: RRR Pulmonary: Tachypnea, clear to auscultation on the right and slightly diminished left base Abdomen: Soft, non tender, bowel sounds present. Extremities: ++ bilateral lower extremity edema. Wounds: Clean and dry.  No erythema or signs of infection.   Lab Results: CBC: Recent Labs    06/01/19 0313 06/02/19 0251  WBC 8.5 8.8  HGB 7.7* 7.3*  HCT 24.7* 23.5*  PLT 282 256   BMET:  Recent Labs    06/01/19 1328 06/01/19 1510 06/02/19 0251  NA 134*  --  135  K 6.3* 5.3* 5.2*  CL 108  --  106  CO2 18*  --  20*  GLUCOSE 118*  --  90  BUN 60*  --  65*  CREATININE 3.29*  --  3.83*  CALCIUM 8.0*  --  8.0*    PT/INR: No results for input(s): LABPROT, INR in the last 72 hours. ABG:  INR: Will add last result for INR, ABG once components are confirmed Will add last 4 CBG results once components are confirmed  Assessment/Plan:  1. CV - SR with HR in the 70-80's. On Lopressor 25 mg bid. 2.  Pulmonary - History of COPD. Continue Duo nebs. On 2-3 liters of oxygen via [place]. He may need oxygen at discharge. PA/LAT CXR ordered but not taken yet. Encourage incentive  spirometer. 3. Anemia-H and H this am slightly decreased to 7.3 and 23.5. He had previous transfusion. Continue Ferrous sulfate. 4. History of CKD (stage IV)- Creatinine this am slightly increased to 3.83. Creatinine upon admission 3.47. 5. ID-On Ceftaroline. No growth aerobically or anaerobically.  6. Hypothyroidism-continue Levothyroxine 50 mcg daily 7. Pyomyositis right sided JP drain. Patient with staples RLQ (s/p incision and drainage of right lower quadrant anterior submuscular abscess 05/20/2019 and would recommend removing staples in a few days) 8. Hyperkalemia-potassium this am 5.2. Will defer to primary 9. Follow up appointment arranged;management per primary  Donielle M ZimmermanPA-C 06/02/2019,7:02 AM [phone]  Pt seen and examined; I agree with assessment. We will sign off as tubes are out and chest xray looks fine. Please call with questions. Mckensie Scotti Z. Orvan Seen, [place]

## 2019-06-02 NOTE — Progress Notes (Signed)
PROGRESS NOTE  [name] YKD:[id number] DOB: Mar 04, 1956 DOA: 05/08/2019 PCP: [name], MD  HPI/Recap of past 24 hours: Alan Small a 63 y.o.malewith medical history significant ofsolitary left kidney, CKD stage IV, left staghorn calculus SP stenting, urethral stricture SP urethroplasty, schizophrenia, clinically diagnosed COPD, depression.  Patient was admitted at [facility] between February 10, 2019 and March 02, 2019. Patient was treated for pyelonephritis and retroperitoneal fluid collection concerning for abscess. Urine culture grew MDR E. coli. Patient underwent aspiration of the abscess on 9/17 with JP drain placement. Patient was started on IV imipenem with a PICC line. Summary ofhisactive problems in the hospital is as following.Underwent left percutaneous nephrolithotomy 03/28/2019 and ureteral stent placement at the same time. Underwent laser lithotripsy and basket extraction on 04/05/2019. Patient was recommended to continue the antibiotics until April 16, 2019.  Patient presented to [facility] on 05/11/2019 with shortness of breath left leg pain and fever and nausea and vomiting. CT scan showed evidence of stone on the left side. Patient was initially placed on the BiPAP. His oxygenation worsened and patient actually required intubation in the emergency department. Urology was consulted and patient underwent urgent cystoscopy and left ureteral stent placement. Patient was started on broad-spectrum antibiotics and his blood cultures came back positive for MRSA. Patient was extubated on 05/20/2019 and since then has remained on nasal cannula. Patient's PICC line was removed on 05/12/2019 and a central line was placed which was removed on 05/18/2019. Foley catheter was inserted on 05/11/2019 after the urology procedure and removed on 05/18/2019. TEE showed no evidence of endocarditis but the patient does have evidence of septic emboli with cavitary lesions  on bilateral lung parenchyma therefore patient is treated as infective endocarditis. 05/19/2019 left thoracentesis/pleural fluid culture came back positive for MRSA. Report showed loculated effusion post procedure. 05/20/2019 had pyomyositis underwent right-sided drain placement after incision and debridement. -Due to patient's complaint of back pain MRI thoracic and lumbar spine were performed on 05/21/2019 which showed evidence of epidural abscess as well as facet joint infection. [facility] neurosurgery was consulted who recommended conservative management. Dr. [name] was on call. -Due to worsening shortness of breath CT chest performed on 05/22/2019 showed evidence of recollection of pleural fluid. Chest tube was inserted by PCCM on 05/22/2019. -VATS 12/23 - tolerated well; on Ceftaroline now. Clinically improving.  Hemoglobin 12/29 8.5 from 6.7 post 1 unit PRBC transfusion 12/28.    06/02/19: Seen and examined this morning.  On his way down for renal ultrasound.  No acute events overnight.  He denies any chest pain.  No dyspnea while laying in bed.  Currently on 3 L with saturation of 99%.   Assessment/Plan: Principal Problem:   Empyema of pleural space (HCC) Active Problems:   Schizophrenia (HCC)   Hypothyroidism   Severe sepsis (HCC)   Pyohydronephrosis   Kidney stone   Chronic kidney disease, stage IV (severe) (HCC)   COPD (chronic obstructive pulmonary disease) (HCC)   Atrial flutter (HCC)   MRSA bacteremia   Septic embolism (HCC)   Epidural abscess   ETOH abuse  Resolving severe sepsiswith septic shock secondary to disseminated MRSA infection thought to have stemmed from a tunneled PICC line that was not maintained/removed following 8-week course of IV antibiotics, septic shock physiology is resolved, POA Pyomyositis SP right-sided flank drain placement POA Left-sided empyema SP thoracentesis/VATS POA. And now chest tube placement Epidural abscess as well as L4-L5 infection - Blood  cultures came back positive for MRSA; Urine  culture came back positive for VRE. 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 - TCTS following - VATS with decortication on 05/20/2019 w/ 2 chest tubes, now removed on 05/31/2019.  Pain controlled.  Continue flutter valve and incentive spirometer.  Out of bed to chair as recommended by CTS. - Urology was initially consulted as well which currently has signed off as below. - Neurosurgery at [facility] with Dr. [name] aware who recommends conservative management with IV antibiotics as long as the patient does not have any symptoms (remains AOx4) -Infectious disease following, patient currently transitioned to ceftaroline, to finish total of 6 weeks,  So far repeat blood cultures 12/23/negative to date. Will follow up with Dr. Delaine Lame, [place] outpatient. Independently viewed chest x-ray done on 06/01/2019 shows mild increase in pulmonary vascularity and left pleural effusion.  Anasarca Diffuse edema with worsening renal function Holding off IV fluids  nephrology consulted and following.  Worsening nonoliguric AKI on CKD 4 in the setting of solitary left kidney Baseline creatinine appears to be 2.3 with GFR in the 28 Creatinine worsening. Continue to avoid nephrotoxins Fluid on hold due to anasarca Renal ultrasound done on 05/23/2019 showed mild left hydronephrosis/pelviectasis, post right nephrectomy, mild bladder wall thickening. Repeated renal ultrasound on 06/02/2019. Continue to monitor urine output Continue daily BMPs Nephrology consulted  Non anion gap metabolic acidosis likely in the setting of worsening renal function. Improving on p.o. sodium bicarb 1300 mg twice daily. Continue daily BMPs  Refractory hyperkalemia in the setting of worsening renal function Potassium 5.4>> continue Lokelma 10 g daily as recommended by  nephrology.  Obstructing/infected renal calculus, Concurrent VRE UTI, likely POA SP emergency ureteral stent placement - Cystoscopy on 05/11/2019 and also was found to have a bulbar urethral stricture.  - ID following, patient currently on ceftaroline per ID - The stricture was dilated.  - The stent was placed in the left ureter.  - The urine culture on 05/11/2019 was negative. A repeat urine culture was sent on 05/16/2019 and that is positive for VRE - Urology no longer following - call back if needed  A. fib/flutter with rapid ventricular rate, resolved Rate is currently controlled on p.o. Lopressor 25 mg twice daily - Anticoagulation not recommended due to history of fall - Echo with normal LV function Continue to monitor on telemetry  Acute hypoxic respiratory failure likely in the setting of left empyema and pulmonary edema O2 saturation 99 % on 3 L Continue pulmonary toilet, incentive spirometer, flutter valve, and IV antibiotics Independently viewed chest x-ray done on 06/01/2019 which showed mild increase in pulmonary vascularity and left pleural effusion.  Acute blood loss anemia/iron deficiency anemia/Anemia of chronic disease 2/2 CKD  Post 1 unit PRBC Drop in Hg FOBT -Iron studies suggestive of iron deficiency Continue daily ferrous sulfate Added 1 dose of Feraheme 500 mgx1  Acute transaminitis Acute hepatitis panel negative CT abdomen and pelvis done on 05/18/2019 without contrast: Multiple low-density lesions again seen within the liver, unchanged, likely cysts. Large exophytic cyst off the left hepatic lobe is stable. Gallbladder unremarkable. LFTs are trending down.  Continue to avoid hepatotoxins.  Alcohol withdrawal syndrome, resolved Out of alcohol withdrawal window.  No sign of alcohol withdrawal.  Paranoid schizophrenia/chronic anxiety Stable  Physical debility PT OT assessed and recommended SNF CSW assisting with placement. Continue PT OT with  assistance and fall precautions.   DVT prophylaxis: Heparin sq  TID Code Status: Full Family Communication: None at bedside  Disposition Plan:  Will plan to DC once CTS and general surgery sign off and patient is hemodynamically stable.  Consultants:   TCTS  Infectious disease  Neurology  General surgery  Nephrology on 06/01/2019.     Objective: Vitals:   06/01/19 2100 06/02/19 0500 06/02/19 0815 06/02/19 1049  BP: 127/75 135/76  137/76  Pulse: 80 75  87  Resp: 11 12    Temp: 98.1 F (36.7 C) 98.2 F (36.8 C)    TempSrc: Oral Oral    SpO2: 100% 100% 98%   Weight:      Height:        Intake/Output Summary (Last 24 hours) at 06/02/2019 1052 Last data filed at 06/02/2019 0500 Gross per 24 hour  Intake 120 ml  Output 700 ml  Net -580 ml   Filed Weights   05/19/2019 2200  Weight: 90.1 kg    Exam:  . General: 63 y.o. year-old male present well-developed well-nourished in no acute distress.  Alert and oriented x3.   . Cardiovascular: Regular rate and rhythm no rubs or gallops. [name] Respiratory: Mild rales at bases no wheezing noted. .   Abdomen: Obese bowel sounds present.   Musculoskeletal: Diffuse edema in all 4 extremities.   Psychiatry: Mood is appropriate for condition and setting.  Data Reviewed: CBC: Recent Labs  Lab 05/29/19 0231 05/30/19 0230 05/30/19 1529 05/31/19 1020 06/01/19 0313 06/02/19 0251  WBC 10.7* 8.1  --  7.8 8.5 8.8  HGB 7.7* 6.7* 8.5* 8.1* 7.7* 7.3*  HCT 24.9* 21.8* 27.1* 25.2* 24.7* 23.5*  MCV 95.0 95.2  --  92.6 91.8 92.2  PLT 357 340  --  283 282 127   Basic Metabolic Panel: Recent Labs  Lab 05/30/19 0230 05/31/19 1020 06/01/19 0313 06/01/19 1328 06/01/19 1510 06/02/19 0251  NA 135 137 135 134*  --  135  K 4.7 5.0 5.4* 6.3* 5.3* 5.2*  CL 108 110 107 108  --  106  CO2 20* 19* 19* 18*  --  20*  GLUCOSE 103* 103* 87 118*  --  90  BUN 43* 44* 54* 60*  --  65*  CREATININE 2.32* 2.69* 3.26* 3.29*  --  3.83*    CALCIUM 8.1* 8.2* 8.1* 8.0*  --  8.0*   GFR: Estimated Creatinine Clearance: 21.4 mL/min (A) (by C-G formula based on SCr of 3.83 mg/dL (H)). Liver Function Tests: Recent Labs  Lab 05/29/19 0231 05/30/19 0230 05/31/19 1020 06/01/19 0313 06/02/19 0251  AST 64* 145* 47* 38 35  ALT 51* 116* 68* 57* 48*  ALKPHOS 267* 551* 384* 353* 294*  BILITOT 0.6 0.6 0.3 0.7 0.5  PROT 5.3* 5.3* 5.6* 5.6* 5.2*  ALBUMIN 1.1* 1.1* 1.2* 1.2* 1.1*   No results for input(s): LIPASE, AMYLASE in the last 168 hours. No results for input(s): AMMONIA in the last 168 hours. Coagulation Profile: No results for input(s): INR, PROTIME in the last 168 hours. Cardiac Enzymes: No results for input(s): CKTOTAL, CKMB, CKMBINDEX, TROPONINI in the last 168 hours. BNP (last 3 results) No results for input(s): PROBNP in the last 8760 hours. HbA1C: No results for input(s): HGBA1C in the last 72 hours. CBG: Recent Labs  Lab 06/01/19 0612 06/01/19 1112 06/01/19 1546 06/01/19 2148 06/02/19 0603  GLUCAP 84 91 131* 118* 81   Lipid Profile: No results for input(s): CHOL, HDL, LDLCALC, TRIG, CHOLHDL, LDLDIRECT in the last 72 hours. Thyroid Function Tests: No results for  input(s): TSH, T4TOTAL, FREET4, T3FREE, THYROIDAB in the last 72 hours. Anemia Panel: No results for input(s): VITAMINB12, FOLATE, FERRITIN, TIBC, IRON, RETICCTPCT in the last 72 hours. Urine analysis:    Component Value Date/Time   COLORURINE YELLOW 05/26/2019 0245   APPEARANCEUR HAZY (A) 05/26/2019 0245   APPEARANCEUR Cloudy (A) 12/01/2018 0927   LABSPEC 1.012 05/26/2019 0245   LABSPEC 1.010 02/16/2014 1650   PHURINE 5.0 05/26/2019 0245   GLUCOSEU NEGATIVE 05/26/2019 0245   GLUCOSEU Negative 02/16/2014 1650   HGBUR LARGE (A) 05/26/2019 0245   BILIRUBINUR NEGATIVE 05/26/2019 0245   BILIRUBINUR Negative 12/01/2018 0927   BILIRUBINUR Negative 02/16/2014 1650   KETONESUR NEGATIVE 05/26/2019 0245   PROTEINUR 30 (A) 05/26/2019 0245    UROBILINOGEN 0.2 11/07/2012 1049   NITRITE NEGATIVE 05/26/2019 0245   LEUKOCYTESUR LARGE (A) 05/26/2019 0245   LEUKOCYTESUR 3+ 02/16/2014 1650   Sepsis Labs: @LABRCNTIP (procalcitonin:4,lacticidven:4)  ) Recent Results (from the past 240 hour(s))  Aerobic/Anaerobic Culture (surgical/deep wound)     Status: None   Collection Time: 05/08/2019  3:41 PM   Specimen: PATH Cytology Pleural fluid; Body Fluid  Result Value Ref Range Status   Specimen Description FLUID LEFT PLEURAL  Final   Special Requests PT ON ANCEF  Final   Gram Stain NO WBC SEEN NO ORGANISMS SEEN   Final   Culture   Final    No growth aerobically or anaerobically. Performed at [facility], [place] [address] [place], [place] [address]    Report Status 05/30/2019 FINAL  Final  Aerobic/Anaerobic Culture (surgical/deep wound)     Status: None   Collection Time: 05/06/2019  3:49 PM   Specimen: PATH Cytology Pleural fluid; Body Fluid  Result Value Ref Range Status   Specimen Description FLUID PLEURAL LEFT  Final   Special Requests NO 2 PT ON ANCEF  Final   Gram Stain   Final    RARE WBC PRESENT,BOTH PMN AND MONONUCLEAR NO ORGANISMS SEEN    Culture   Final    No growth aerobically or anaerobically. Performed at [facility], [place] [address], [place], [place] [address]    Report Status 05/30/2019 FINAL  Final      Studies: DG Chest 2 View  Result Date: 06/02/2019 CLINICAL DATA:  63 year old male under evaluation for pneumothorax. EXAM: CHEST - 2 VIEW COMPARISON:  Chest x-ray 06/01/2019. FINDINGS: No pneumothorax. Patchy multifocal interstitial and airspace disease in the lungs bilaterally (left much greater than right). 2.1 cm nodular density projecting over the left upper lobe, correlating to pulmonary nodule seen on prior chest CT 05/17/2019. Moderate left pleural effusion which appears partially loculated. No right pleural effusion. No evidence of pulmonary edema. Heart size is normal. Upper mediastinal  contours are within normal limits. Aortic atherosclerosis. IMPRESSION: 1. No pneumothorax. 2. Worsening patchy multifocal interstitial and airspace disease in the lungs bilaterally (left much greater than right), concerning for developing multilobar pneumonia. 3. Moderate left pleural effusion which remains partially loculated. 4. Left upper lobe pulmonary nodule. Electronically Signed   By: Vinnie Langton M.D.   On: 06/02/2019 09:34   US RENAL  Result Date: 06/02/2019 CLINICAL DATA:  Acute kidney injury. EXAM: RENAL / URINARY TRACT ULTRASOUND COMPLETE COMPARISON:  Renal ultrasound 05/23/2019 FINDINGS: Right Kidney: Surgically absent. Left Kidney: Renal measurements: 11.4 x 6.4 x 4.5 cm = volume: 172 mL. Previous hydronephrosis has improved. Previous shadowing calculus is not seen. Patient with reported ureteral stent, not visualized sonographically. Bladder: Nondistended, questionable Foley catheter.  Other: Technically limited exam. IMPRESSION: 1. Technically limited evaluation. Previous left hydronephrosis is no longer seen. Patient has reported left ureteral stent, however this is not visualized sonographically. Additionally the previous left renal calculus is not visualized, likely technical. 2. Prior right nephrectomy. 3. Decompressed urinary bladder. Electronically Signed   By: Keith Rake M.D.   On: 06/02/2019 03:33    Scheduled Meds: . bisacodyl  10 mg Oral Daily  . Chlorhexidine Gluconate Cloth  6 each Topical Daily  . feeding supplement (NEPRO CARB STEADY)  237 mL Oral BID BM  . feeding supplement (PRO-STAT SUGAR FREE 64)  30 mL Oral BID  . ferrous sulfate  325 mg Oral Q breakfast  . heparin injection (subcutaneous)  5,000 Units Subcutaneous Q8H  . insulin aspart  0-24 Units Subcutaneous TID AC & HS  . ipratropium-albuterol  3 mL Nebulization BID  . levothyroxine  50 mcg Oral QAC breakfast  . metoprolol tartrate  25 mg Oral BID  . multivitamin with minerals  1 tablet Oral Daily    . nicotine  21 mg Transdermal Daily  . pantoprazole (PROTONIX) IV  40 mg Intravenous Q12H  . senna-docusate  1 tablet Oral QHS  . sodium bicarbonate  1,300 mg Oral BID  . sodium zirconium cyclosilicate  10 g Oral Daily    Continuous Infusions: . sodium chloride    . ceftaroline (TEFLARO) 400 mg IVPB 300 mg (06/02/19 0553)  . ferumoxytol       LOS: 9 days     Kayleen Memos, MD [facility] Pager [phone]  If 7PM-7AM, please contact night-coverage [email or web address] Password [email or web address] 06/02/2019, 10:52 AM

## 2019-06-02 NOTE — Progress Notes (Signed)
[facility] Progress Note    Assessment/ Plan:   1. Nonoliguric AoCKD4: labile but baseline recently around 2.5.  Likley multifactorial.  Obstruction ruled out with renal US but stent not able to be visualized.  Also sending complements- could have syn-infectious GN as well.  No indication for HD yet--> monitor closely.  If we have to use large doses of diuretics for volume status may tip over the edge,  2. Solitary L kidney, remote nephrectomy 3. Hyperkalemia, mild 4. Mild metabolic acidosis 5. Nephrolithiasis, current L ureteral stent; hx/o infected stones 6. Disseminated MRSA: empyema (s/p VATS--> decortication and wedge resection), septic emboly, epidural abscess, pyomyositis s/p I&D. ID following. Ceftaroline.  7. Schizophrenia 8. COPD  Subjective:    Cr rising a little.  Renal US with no hydro but wasn't able to visualize ureteral stent   Objective:   BP (!) 144/76 (BP Location: Left Arm)   Pulse 72   Temp 98 F (36.7 C) (Oral)   Resp 15   Ht 6' (1.829 m)   Wt 90.1 kg   SpO2 99%   BMI 26.94 kg/m   Intake/Output Summary (Last 24 hours) at 06/02/2019 1507 Last data filed at 06/02/2019 0500 Gross per 24 hour  Intake 120 ml  Output 700 ml  Net -580 ml   Weight change:   Physical Exam: Gen: sitting in bed NAD CVS: RRR no m/r/g Resp: on O2 Abd: nontender Ext: 1+ anasarca  Imaging: DG Chest 2 View  Result Date: 06/02/2019 CLINICAL DATA:  63 year old male under evaluation for pneumothorax. EXAM: CHEST - 2 VIEW COMPARISON:  Chest x-ray 06/01/2019. FINDINGS: No pneumothorax. Patchy multifocal interstitial and airspace disease in the lungs bilaterally (left much greater than right). 2.1 cm nodular density projecting over the left upper lobe, correlating to pulmonary nodule seen on prior chest CT 05/11/2019. Moderate left pleural effusion which appears partially loculated. No right pleural effusion. No evidence of pulmonary edema. Heart size is normal.  Upper mediastinal contours are within normal limits. Aortic atherosclerosis. IMPRESSION: 1. No pneumothorax. 2. Worsening patchy multifocal interstitial and airspace disease in the lungs bilaterally (left much greater than right), concerning for developing multilobar pneumonia. 3. Moderate left pleural effusion which remains partially loculated. 4. Left upper lobe pulmonary nodule. Electronically Signed   By: Vinnie Langton M.D.   On: 06/02/2019 09:34   US RENAL  Result Date: 06/02/2019 CLINICAL DATA:  Acute kidney injury. EXAM: RENAL / URINARY TRACT ULTRASOUND COMPLETE COMPARISON:  Renal ultrasound 05/23/2019 FINDINGS: Right Kidney: Surgically absent. Left Kidney: Renal measurements: 11.4 x 6.4 x 4.5 cm = volume: 172 mL. Previous hydronephrosis has improved. Previous shadowing calculus is not seen. Patient with reported ureteral stent, not visualized sonographically. Bladder: Nondistended, questionable Foley catheter. Other: Technically limited exam. IMPRESSION: 1. Technically limited evaluation. Previous left hydronephrosis is no longer seen. Patient has reported left ureteral stent, however this is not visualized sonographically. Additionally the previous left renal calculus is not visualized, likely technical. 2. Prior right nephrectomy. 3. Decompressed urinary bladder. Electronically Signed   By: Keith Rake M.D.   On: 06/02/2019 03:33   DG Chest Port 1 View  Result Date: 06/01/2019 CLINICAL DATA:  Chest tube removal, shortness of breath EXAM: PORTABLE CHEST 1 VIEW COMPARISON:  05/31/2019 FINDINGS: Interval removal of left chest tube. No pneumothorax. Small residual left pleural effusion. Left base atelectasis and mild vascular congestion. Right lung clear. No acute bony abnormality. IMPRESSION: Interval removal of left chest tube without pneumothorax. Small left effusion with  left base atelectasis. Electronically Signed   By: Rolm Baptise M.D.   On: 06/01/2019 08:35    Labs: BMET Recent  Labs  Lab 05/28/19 0404 05/29/19 0231 05/29/19 1802 05/30/19 0230 05/31/19 1020 06/01/19 0313 06/01/19 1328 06/01/19 1510 06/02/19 0251  NA 138 136  --  135 137 135 134*  --  135  K 5.0 5.5* 4.9 4.7 5.0 5.4* 6.3* 5.3* 5.2*  CL 113* 112*  --  108 110 107 108  --  106  CO2 19* 19*  --  20* 19* 19* 18*  --  20*  GLUCOSE 85 82  --  103* 103* 87 118*  --  90  BUN 38* 42*  --  43* 44* 54* 60*  --  65*  CREATININE 2.35* 2.34*  --  2.32* 2.69* 3.26* 3.29*  --  3.83*  CALCIUM 7.8* 8.2*  --  8.1* 8.2* 8.1* 8.0*  --  8.0*   CBC Recent Labs  Lab 05/30/19 0230 05/30/19 1529 05/31/19 1020 06/01/19 0313 06/02/19 0251  WBC 8.1  --  7.8 8.5 8.8  HGB 6.7* 8.5* 8.1* 7.7* 7.3*  HCT 21.8* 27.1* 25.2* 24.7* 23.5*  MCV 95.2  --  92.6 91.8 92.2  PLT 340  --  283 282 256    Medications:    . bisacodyl  10 mg Oral Daily  . Chlorhexidine Gluconate Cloth  6 each Topical Daily  . feeding supplement (NEPRO CARB STEADY)  237 mL Oral BID BM  . feeding supplement (PRO-STAT SUGAR FREE 64)  30 mL Oral BID  . ferrous sulfate  325 mg Oral Q breakfast  . heparin injection (subcutaneous)  5,000 Units Subcutaneous Q8H  . insulin aspart  0-24 Units Subcutaneous TID AC & HS  . ipratropium-albuterol  3 mL Nebulization BID  . levothyroxine  50 mcg Oral QAC breakfast  . metoprolol tartrate  25 mg Oral BID  . multivitamin with minerals  1 tablet Oral Daily  . nicotine  21 mg Transdermal Daily  . pantoprazole (PROTONIX) IV  40 mg Intravenous Q12H  . senna-docusate  1 tablet Oral QHS  . sodium bicarbonate  1,300 mg Oral BID  . sodium zirconium cyclosilicate  10 g Oral Daily      Madelon Lips, MD 06/02/2019, 3:07 PM

## 2019-06-02 NOTE — TOC Progression Note (Signed)
Transition of Care [facility]) - Progression Note    Patient Details  Name: Alan Small MRN: [id number] Date of Birth: [birth year]  [facility] CM/SW [place], [place] Phone Number: 06/02/2019, 3:21 PM  Clinical Narrative:   CSW spoke with [facility] Supervisor on [employer], and gave bed offers. Kylie chose [facility] for SNF. CSW reached out to [facility] Admissions to ensure they had beds available for patient when he is stable. CSW to follow.    Expected Discharge Plan: [place] Barriers to Discharge: SNF Pending bed offer, Continued Medical Work up, [place] (PASRR)  Expected Discharge Plan and Services Expected Discharge Plan: [place] In-house Referral: Clinical Social Work     Living arrangements for the past 2 months: Group Home([facility])                                       Social Determinants of Health (SDOH) Interventions    Readmission Risk Interventions Readmission Risk Prevention Plan 05/13/2019  Transportation Screening Complete  Palliative Care Screening Not Applicable  Medication Review (RN Care Manager) Complete  Some recent data might be hidden

## 2019-06-02 NOTE — Progress Notes (Signed)
CSW has faxed all needed clinicals to [place] [facility] for patient's PASSR number, pending at this time.   [place], [place]

## 2019-06-03 ENCOUNTER — Inpatient Hospital Stay (HOSPITAL_COMMUNITY): Payer: Medicare Other

## 2019-06-03 LAB — BASIC METABOLIC PANEL
Anion gap: 7 (ref 5–15)
BUN: 80 mg/dL — ABNORMAL HIGH (ref 8–23)
CO2: 20 mmol/L — ABNORMAL LOW (ref 22–32)
Calcium: 7.6 mg/dL — ABNORMAL LOW (ref 8.9–10.3)
Chloride: 110 mmol/L (ref 98–111)
Creatinine, Ser: 4.54 mg/dL — ABNORMAL HIGH (ref 0.61–1.24)
GFR calc Af Amer: 15 mL/min — ABNORMAL LOW (ref >60)
GFR calc non Af Amer: 13 mL/min — ABNORMAL LOW (ref >60)
Glucose, Bld: 100 mg/dL — ABNORMAL HIGH (ref 70–99)
Potassium: 5.4 mmol/L — ABNORMAL HIGH (ref 3.5–5.1)
Sodium: 137 mmol/L (ref 135–145)

## 2019-06-03 LAB — PROCALCITONIN: Procalcitonin: 1.01 ng/mL

## 2019-06-03 LAB — URINALYSIS, ROUTINE W REFLEX MICROSCOPIC
Bilirubin Urine: NEGATIVE
Glucose, UA: NEGATIVE mg/dL
Ketones, ur: NEGATIVE mg/dL
Nitrite: NEGATIVE
Protein, ur: 100 mg/dL — AB
Specific Gravity, Urine: 1.013 (ref 1.005–1.030)
WBC, UA: >50 WBC/hpf — ABNORMAL HIGH (ref 0–5)
pH: 5 (ref 5.0–8.0)

## 2019-06-03 LAB — CBC WITH DIFFERENTIAL/PLATELET
Abs Immature Granulocytes: 1.05 10*3/uL — ABNORMAL HIGH (ref 0.00–0.07)
Basophils Absolute: 0.1 10*3/uL (ref 0.0–0.1)
Basophils Relative: 1 %
Eosinophils Absolute: 0.2 10*3/uL (ref 0.0–0.5)
Eosinophils Relative: 2 %
HCT: 23.6 % — ABNORMAL LOW (ref 39.0–52.0)
Hemoglobin: 7.1 g/dL — ABNORMAL LOW (ref 13.0–17.0)
Immature Granulocytes: 11 %
Lymphocytes Relative: 9 %
Lymphs Abs: 0.9 10*3/uL (ref 0.7–4.0)
MCH: 28.9 pg (ref 26.0–34.0)
MCHC: 30.1 g/dL (ref 30.0–36.0)
MCV: 95.9 fL (ref 80.0–100.0)
Monocytes Absolute: 1.1 10*3/uL — ABNORMAL HIGH (ref 0.1–1.0)
Monocytes Relative: 11 %
Neutro Abs: 6 10*3/uL (ref 1.7–7.7)
Neutrophils Relative %: 66 %
Platelets: 209 10*3/uL (ref 150–400)
RBC: 2.46 MIL/uL — ABNORMAL LOW (ref 4.22–5.81)
RDW: 14.9 % (ref 11.5–15.5)
WBC: 9.3 10*3/uL (ref 4.0–10.5)
nRBC: 0 % (ref 0.0–0.2)

## 2019-06-03 LAB — EXPECTORATED SPUTUM ASSESSMENT W GRAM STAIN, RFLX TO RESP C: Special Requests: NORMAL

## 2019-06-03 LAB — GLUCOSE, CAPILLARY
Glucose-Capillary: 139 mg/dL — ABNORMAL HIGH (ref 70–99)
Glucose-Capillary: 187 mg/dL — ABNORMAL HIGH (ref 70–99)
Glucose-Capillary: 94 mg/dL (ref 70–99)
Glucose-Capillary: 92 mg/dL (ref 70–99)

## 2019-06-03 MED ORDER — SODIUM ZIRCONIUM CYCLOSILICATE 10 G PO PACK
10.0000 g | PACK | Freq: Two times a day (BID) | ORAL | Status: DC
  Administered 2019-06-03 – 2019-06-07 (×9): 10 g via ORAL
  Filled 2019-06-03 (×9): qty 1

## 2019-06-03 NOTE — Progress Notes (Signed)
PROGRESS NOTE  Alan Small WUJ:[id number] DOB: Jan 21, 1956 DOA: 05/28/2019 PCP: [name], MD  HPI/Recap of past 24 hours: Alan Small a 64 y.o.malewith medical history significant ofsolitary left kidney, CKD stage IV, left staghorn calculus SP stenting, urethral stricture SP urethroplasty, schizophrenia, clinically diagnosed COPD, depression.  Patient was admitted at [facility] between February 10, 2019 and March 02, 2019. Patient was treated for pyelonephritis and retroperitoneal fluid collection concerning for abscess. Urine culture grew MDR E. coli. Patient underwent aspiration of the abscess on 9/17 with JP drain placement. Patient was started on IV imipenem with a PICC line. Summary ofhisactive problems in the hospital is as following.Underwent left percutaneous nephrolithotomy 03/28/2019 and ureteral stent placement at the same time. Underwent laser lithotripsy and basket extraction on 04/05/2019. Patient was recommended to continue the antibiotics until April 16, 2019.  Patient presented to [facility] on 05/11/2019 with shortness of breath left leg pain and fever and nausea and vomiting. CT scan showed evidence of stone on the left side. Patient was initially placed on the BiPAP. His oxygenation worsened and patient actually required intubation in the emergency department. Urology was consulted and patient underwent urgent cystoscopy and left ureteral stent placement. Patient was started on broad-spectrum antibiotics and his blood cultures came back positive for MRSA. Patient was extubated on 05/20/2019 and since then has remained on nasal cannula. Patient's PICC line was removed on 05/12/2019 and a central line was placed which was removed on 05/18/2019. Foley catheter was inserted on 05/11/2019 after the urology procedure and removed on 05/18/2019. TEE showed no evidence of endocarditis but the patient does have evidence of septic emboli with cavitary lesions  on bilateral lung parenchyma therefore patient is treated as infective endocarditis. 05/19/2019 left thoracentesis/pleural fluid culture came back positive for MRSA. Report showed loculated effusion post procedure. 05/20/2019 had pyomyositis underwent right-sided drain placement after incision and debridement. -Due to patient's complaint of back pain MRI thoracic and lumbar spine were performed on 05/21/2019 which showed evidence of epidural abscess as well as facet joint infection. [facility] neurosurgery was consulted who recommended conservative management. Dr. [name] was on call. -Due to worsening shortness of breath CT chest performed on 05/22/2019 showed evidence of recollection of pleural fluid. Chest tube was inserted by PCCM on 05/22/2019. -VATS 12/23 - tolerated well; on Ceftaroline now. Clinically improving.  Hemoglobin 12/29 8.5 from 6.7 post 1 unit PRBC transfusion 12/28.    06/03/19: Seen and examined.  Dyspneic with minimal movement.    Assessment/Plan: Principal Problem:   Empyema of pleural space (HCC) Active Problems:   Schizophrenia (HCC)   Hypothyroidism   Severe sepsis (HCC)   Pyohydronephrosis   Kidney stone   Chronic kidney disease, stage IV (severe) (HCC)   COPD (chronic obstructive pulmonary disease) (HCC)   Atrial flutter (HCC)   MRSA bacteremia   Septic embolism (HCC)   Epidural abscess   ETOH abuse  Resolving severe sepsiswith septic shock secondary to disseminated MRSA infection thought to have stemmed from a tunneled PICC line that was not maintained or removed following 8-week course of IV antibiotics Septic shock physiology has resolved, POA Pyomyositis SP right-sided flank drain placement POA Left-sided empyema SP thoracentesis/VATS POA. And now chest tube placement Epidural abscess as well as L4-L5 infection - Blood cultures came back positive for MRSA; Urine culture came back positive for VRE. -TEE negative for vegetation, but CT chest was positive for  evidence of septic emboli therefore patient is being treated as a possible right-sided  infective endocarditis. - TCTS following - VATS with decortication on 05/08/2019 w/ 2 chest tubes. Pain controlled.  Continue flutter valve and incentive spirometer.  Out of bed to chair as recommended by CTS. - Urology was initially consulted as well which currently has signed off as below. - Neurosurgery at [facility] with Dr. [name] aware who recommends conservative management with IV antibiotics as long as the patient does not have any symptoms (remains AOx4) -Infectious disease following, patient currently on ceftaroline, to finish total of 6 weeks,  So far repeat blood cultures 12/23/negative to date. Will follow up with Dr. Delaine Lame, [place] outpatient.  Anasarca Diffuse edema with worsening renal function Holding off IV fluids  nephrology consulted and following.  Worsening nonoliguric AKI on CKD 4 in the setting of solitary left kidney Baseline creatinine appears to be 2.3 with GFR in the 28 Creatinine worsening. Continue to avoid nephrotoxins Fluid on hold due to anasarca Renal ultrasound done on 05/23/2019 showed mild left hydronephrosis/pelviectasis, post right nephrectomy, mild bladder wall thickening. Repeated renal ultrasound on 06/02/2019 no hydronephrosis, renal stent could not be visualized. Continue Daily renal panel Nephrology following.  Appreciate assistance.  Non anion gap metabolic acidosis likely in the setting of worsening renal function. Improving on p.o. sodium bicarb 1300 mg twice daily. Managed by nephrology.  Refractory hyperkalemia in the setting of worsening renal function Potassium 5.4>> 5.4.  Continue Lokelma 10 g daily as recommended by nephrology.  Obstructing/infected renal calculus, Concurrent VRE UTI, likely POA SP emergency ureteral stent placement - Cystoscopy on 05/11/2019 and also was found to have a bulbar urethral stricture.  - On ceftaroline per ID - The  stricture was dilated.  - The stent was placed in the left ureter.  - The urine culture on 05/11/2019 was negative. A repeat urine culture was sent on 05/16/2019 and  positive for VRE - Urology no longer following - call back if needed  A. fib/flutter with rapid ventricular rate, resolved Rate is currently controlled on p.o. Lopressor 25 mg twice daily - Anticoagulation not recommended due to history of fall and anemia - Echo with normal LV function Continue to monitor on telemetry  Acute hypoxic respiratory failure likely in the setting of left empyema and pulmonary edema Not on oxygen supplementation at baseline Currently requiring at least 2 L to maintain O2 saturation greater than 92% Continue pulmonary toilet, incentive spirometer, flutter valve, and IV antibiotics  Acute blood loss anemia/iron deficiency anemia/Anemia of chronic disease 2/2 CKD  Post 1 unit PRBC Suspect drop in hemoglobin Multifactorial in the setting of worsening renal function, internal hemorrhoids with positive FOBT, iron deficiency. Received 1 dose of Feraheme 520 mg once on 06/02/2019. Continue daily ferrous sulfate Transfuse hemoglobin less than 7.0.  Acute transaminitis Acute hepatitis panel negative CT abdomen and pelvis done on 05/18/2019 without contrast: Multiple low-density lesions again seen within the liver, unchanged, likely cysts. Large exophytic cyst off the left hepatic lobe is stable. Gallbladder unremarkable. Trend LFTs and continue to avoid hepatotoxins.  Alcohol withdrawal syndrome, resolved Out of alcohol withdrawal window.  No sign of alcohol withdrawal.  Paranoid schizophrenia/chronic anxiety Stable  Severe physical debility PT OT assessed and recommended SNF CSW assisting with placement. Continue PT OT with assistance and fall precautions. Out of bed to chair as tolerated   DVT prophylaxis: Heparin sq TID Code Status: Full Family Communication: None at bedside   Disposition Plan:  Will plan to DC once CTS and general surgery sign off and patient is hemodynamically stable.  Consultants:   TCTS  Infectious disease  Neurology  General surgery  Nephrology on 06/01/2019.     Objective: Vitals:   06/03/19 0800 06/03/19 0810 06/03/19 0900 06/03/19 1115  BP: 138/74  132/71 (!) 147/77  Pulse: 79  73 76  Resp: (!) 21  14 20   Temp:   97.8 F (36.6 C) 97.8 F (36.6 C)  TempSrc:   Oral Oral  SpO2: 98% 100% 98% 98%  Weight:      Height:        Intake/Output Summary (Last 24 hours) at 06/03/2019 1123 Last data filed at 06/03/2019 0300 Gross per 24 hour  Intake 674 ml  Output 650 ml  Net 24 ml   Filed Weights   05/20/2019 2200  Weight: 90.1 kg    Exam:  . General: 64 y.o. year-old male pleasant well-developed well-nourished in no acute distress.  Alert and oriented x3.   . Cardiovascular: Regular rate and rhythm no rubs or gallops.   [name] Respiratory: Mild rales at bases no wheezing noted.   Abdomen: Obese normal bowel sounds present  musculoskeletal: Diffuse edema all throughout.   Psychiatry: Mood is appropriate for condition and setting.  Data Reviewed: CBC: Recent Labs  Lab 05/30/19 0230 05/30/19 1529 05/31/19 1020 06/01/19 0313 06/02/19 0251 06/03/19 0659  WBC 8.1  --  7.8 8.5 8.8 9.3  NEUTROABS  --   --   --   --   --  6.0  HGB 6.7* 8.5* 8.1* 7.7* 7.3* 7.1*  HCT 21.8* 27.1* 25.2* 24.7* 23.5* 23.6*  MCV 95.2  --  92.6 91.8 92.2 95.9  PLT 340  --  283 282 256 431   Basic Metabolic Panel: Recent Labs  Lab 05/31/19 1020 06/01/19 0313 06/01/19 1328 06/01/19 1510 06/02/19 0251 06/03/19 0659  NA 137 135 134*  --  135 137  K 5.0 5.4* 6.3* 5.3* 5.2* 5.4*  CL 110 107 108  --  106 110  CO2 19* 19* 18*  --  20* 20*  GLUCOSE 103* 87 118*  --  90 100*  BUN 44* 54* 60*  --  65* 80*  CREATININE 2.69* 3.26* 3.29*  --  3.83* 4.54*  CALCIUM 8.2* 8.1* 8.0*  --  8.0* 7.6*   GFR: Estimated Creatinine Clearance: 18 mL/min  (A) (by C-G formula based on SCr of 4.54 mg/dL (H)). Liver Function Tests: Recent Labs  Lab 05/29/19 0231 05/30/19 0230 05/31/19 1020 06/01/19 0313 06/02/19 0251  AST 64* 145* 47* 38 35  ALT 51* 116* 68* 57* 48*  ALKPHOS 267* 551* 384* 353* 294*  BILITOT 0.6 0.6 0.3 0.7 0.5  PROT 5.3* 5.3* 5.6* 5.6* 5.2*  ALBUMIN 1.1* 1.1* 1.2* 1.2* 1.1*   No results for input(s): LIPASE, AMYLASE in the last 168 hours. No results for input(s): AMMONIA in the last 168 hours. Coagulation Profile: No results for input(s): INR, PROTIME in the last 168 hours. Cardiac Enzymes: No results for input(s): CKTOTAL, CKMB, CKMBINDEX, TROPONINI in the last 168 hours. BNP (last 3 results) No results for input(s): PROBNP in the last 8760 hours. HbA1C: No results for input(s): HGBA1C in the last 72 hours. CBG: Recent Labs  Lab 06/02/19 1102 06/02/19 1618 06/02/19 2131 06/03/19 0626 06/03/19 1113  GLUCAP 101* 99 91 139* 187*   Lipid Profile: No results for input(s): CHOL, HDL, LDLCALC, TRIG, CHOLHDL, LDLDIRECT in the last 72 hours. Thyroid Function Tests: No results for input(s): TSH, T4TOTAL, FREET4, T3FREE, THYROIDAB in the last 72 hours. Anemia  Panel: No results for input(s): VITAMINB12, FOLATE, FERRITIN, TIBC, IRON, RETICCTPCT in the last 72 hours. Urine analysis:    Component Value Date/Time   COLORURINE AMBER (A) 06/03/2019 0822   APPEARANCEUR TURBID (A) 06/03/2019 0822   APPEARANCEUR Cloudy (A) 12/01/2018 0927   LABSPEC 1.013 06/03/2019 0822   LABSPEC 1.010 02/16/2014 1650   PHURINE 5.0 06/03/2019 0822   GLUCOSEU NEGATIVE 06/03/2019 0822   GLUCOSEU Negative 02/16/2014 1650   HGBUR LARGE (A) 06/03/2019 0822   BILIRUBINUR NEGATIVE 06/03/2019 0822   BILIRUBINUR Negative 12/01/2018 0927   BILIRUBINUR Negative 02/16/2014 1650   KETONESUR NEGATIVE 06/03/2019 0822   PROTEINUR 100 (A) 06/03/2019 0822   UROBILINOGEN 0.2 11/07/2012 1049   NITRITE NEGATIVE 06/03/2019 0822   LEUKOCYTESUR LARGE  (A) 06/03/2019 0822   LEUKOCYTESUR 3+ 02/16/2014 1650   Sepsis Labs: @LABRCNTIP (procalcitonin:4,lacticidven:4)  ) Recent Results (from the past 240 hour(s))  Aerobic/Anaerobic Culture (surgical/deep wound)     Status: None   Collection Time: 05/20/2019  3:41 PM   Specimen: PATH Cytology Pleural fluid; Body Fluid  Result Value Ref Range Status   Specimen Description FLUID LEFT PLEURAL  Final   Special Requests PT ON ANCEF  Final   Gram Stain NO WBC SEEN NO ORGANISMS SEEN   Final   Culture   Final    No growth aerobically or anaerobically. Performed at [facility], [place] [address] [place], [place] [address]    Report Status 05/30/2019 FINAL  Final  Aerobic/Anaerobic Culture (surgical/deep wound)     Status: None   Collection Time: 05/26/2019  3:49 PM   Specimen: PATH Cytology Pleural fluid; Body Fluid  Result Value Ref Range Status   Specimen Description FLUID PLEURAL LEFT  Final   Special Requests NO 2 PT ON ANCEF  Final   Gram Stain   Final    RARE WBC PRESENT,BOTH PMN AND MONONUCLEAR NO ORGANISMS SEEN    Culture   Final    No growth aerobically or anaerobically. Performed at [facility], [place] [address], [place], [place] [address]    Report Status 05/30/2019 FINAL  Final      Studies: DG Chest 2 View  Result Date: 06/03/2019 CLINICAL DATA:  Shortness of breath and chest pain. COPD. Pleural effusion. EXAM: CHEST - 2 VIEW COMPARISON:  1 day prior FINDINGS: Midline trachea. Borderline cardiomegaly. Small left pleural effusion, similar. Minimal loculation laterally. No pneumothorax. Primarily left-sided interstitial opacities, minimally improved. Airspace disease at the left lung base similar. Left upper lobe pulmonary nodule again identified. IMPRESSION: Minimal improvement in left-sided interstitial opacities, favoring asymmetric pulmonary edema. Small left pleural effusion and adjacent airspace disease, similar. Electronically Signed   By: Abigail Miyamoto M.D.   On:  06/03/2019 10:13    Scheduled Meds: . bisacodyl  10 mg Oral Daily  . Chlorhexidine Gluconate Cloth  6 each Topical Daily  . feeding supplement (NEPRO CARB STEADY)  237 mL Oral BID BM  . feeding supplement (PRO-STAT SUGAR FREE 64)  30 mL Oral BID  . ferrous sulfate  325 mg Oral Q breakfast  . heparin injection (subcutaneous)  5,000 Units Subcutaneous Q8H  . insulin aspart  0-24 Units Subcutaneous TID AC & HS  . ipratropium-albuterol  3 mL Nebulization BID  . levothyroxine  50 mcg Oral QAC breakfast  . metoprolol tartrate  25 mg Oral BID  . multivitamin with minerals  1 tablet Oral Daily  . nicotine  21 mg Transdermal Daily  . pantoprazole (PROTONIX) IV  40 mg Intravenous Q12H  . senna-docusate  1 tablet Oral QHS  . sodium bicarbonate  1,300 mg Oral BID  . sodium zirconium cyclosilicate  10 g Oral BID    Continuous Infusions: . sodium chloride    . ceftaroline (TEFLARO) 400 mg IVPB 300 mg (06/03/19 3374)     LOS: 10 days     Kayleen Memos, MD [facility] Pager [phone]  If 7PM-7AM, please contact night-coverage [email or web address] Password [facility] 06/03/2019, 11:23 AM

## 2019-06-03 NOTE — Plan of Care (Signed)
Continue to monitor

## 2019-06-03 NOTE — Progress Notes (Addendum)
Was at patient's bedside to collect sputum culture.  Patient said he would cough in the cup given and provide a sample.

## 2019-06-03 NOTE — Progress Notes (Addendum)
[facility] Progress Note    Assessment/ Plan:   1. Nonoliguric AoCKD4: labile but baseline recently around 2.5.  Likley multifactorial.  Obstruction ruled out with renal US but stent not able to be visualized.  Sending for CT abd/ pelvis w/o contrast to visualize stent; complements pending, re-culturing urine, ? If AIN playing a role too.  If no reversible cause he is a poor candidate for dialysis- I've put in pall care c/s.  2. Solitary L kidney, remote nephrectomy 3. Hyperkalemia, mild--> increase lokelma 4. Mild metabolic acidosis 5. Nephrolithiasis, current L ureteral stent; hx/o infected stones 6. Disseminated MRSA: empyema (s/p VATS--> decortication and wedge resection), septic emboly, epidural abscess, pyomyositis s/p I&D. ID following. Ceftaroline.  7. Schizophrenia 8. COPD  Subjective:    Cr up again today.  No uremic symptoms.  Discussed situation with pt-- things aren't going in right direction.   Objective:   BP (!) 147/77 (BP Location: Left Arm)   Pulse 76   Temp 97.8 F (36.6 C) (Oral)   Resp 20   Ht 6' (1.829 m)   Wt 90.1 kg   SpO2 98%   BMI 26.94 kg/m   Intake/Output Summary (Last 24 hours) at 06/03/2019 1321 Last data filed at 06/03/2019 0300 Gross per 24 hour  Intake 474 ml  Output 650 ml  Net -176 ml   Weight change:   Physical Exam: Gen: sitting in bed NAD CVS: RRR no m/r/g Resp: on O2 Abd: nontender Ext: 1+ anasarca  Imaging: DG Chest 2 View  Result Date: 06/03/2019 CLINICAL DATA:  Shortness of breath and chest pain. COPD. Pleural effusion. EXAM: CHEST - 2 VIEW COMPARISON:  1 day prior FINDINGS: Midline trachea. Borderline cardiomegaly. Small left pleural effusion, similar. Minimal loculation laterally. No pneumothorax. Primarily left-sided interstitial opacities, minimally improved. Airspace disease at the left lung base similar. Left upper lobe pulmonary nodule again identified. IMPRESSION: Minimal improvement in left-sided  interstitial opacities, favoring asymmetric pulmonary edema. Small left pleural effusion and adjacent airspace disease, similar. Electronically Signed   By: Abigail Miyamoto M.D.   On: 06/03/2019 10:13   DG Chest 2 View  Result Date: 06/02/2019 CLINICAL DATA:  64 year old male under evaluation for pneumothorax. EXAM: CHEST - 2 VIEW COMPARISON:  Chest x-ray 06/01/2019. FINDINGS: No pneumothorax. Patchy multifocal interstitial and airspace disease in the lungs bilaterally (left much greater than right). 2.1 cm nodular density projecting over the left upper lobe, correlating to pulmonary nodule seen on prior chest CT 05/24/2019. Moderate left pleural effusion which appears partially loculated. No right pleural effusion. No evidence of pulmonary edema. Heart size is normal. Upper mediastinal contours are within normal limits. Aortic atherosclerosis. IMPRESSION: 1. No pneumothorax. 2. Worsening patchy multifocal interstitial and airspace disease in the lungs bilaterally (left much greater than right), concerning for developing multilobar pneumonia. 3. Moderate left pleural effusion which remains partially loculated. 4. Left upper lobe pulmonary nodule. Electronically Signed   By: Vinnie Langton M.D.   On: 06/02/2019 09:34   US RENAL  Result Date: 06/02/2019 CLINICAL DATA:  Acute kidney injury. EXAM: RENAL / URINARY TRACT ULTRASOUND COMPLETE COMPARISON:  Renal ultrasound 05/23/2019 FINDINGS: Right Kidney: Surgically absent. Left Kidney: Renal measurements: 11.4 x 6.4 x 4.5 cm = volume: 172 mL. Previous hydronephrosis has improved. Previous shadowing calculus is not seen. Patient with reported ureteral stent, not visualized sonographically. Bladder: Nondistended, questionable Foley catheter. Other: Technically limited exam. IMPRESSION: 1. Technically limited evaluation. Previous left hydronephrosis is no longer seen. Patient has reported left  ureteral stent, however this is not visualized sonographically.  Additionally the previous left renal calculus is not visualized, likely technical. 2. Prior right nephrectomy. 3. Decompressed urinary bladder. Electronically Signed   By: Keith Rake M.D.   On: 06/02/2019 03:33    Labs: BMET Recent Labs  Lab 05/29/19 0231 05/30/19 0230 05/31/19 1020 06/01/19 0313 06/01/19 1328 06/01/19 1510 06/02/19 0251 06/03/19 0659  NA 136 135 137 135 134*  --  135 137  K 5.5* 4.7 5.0 5.4* 6.3* 5.3* 5.2* 5.4*  CL 112* 108 110 107 108  --  106 110  CO2 19* 20* 19* 19* 18*  --  20* 20*  GLUCOSE 82 103* 103* 87 118*  --  90 100*  BUN 42* 43* 44* 54* 60*  --  65* 80*  CREATININE 2.34* 2.32* 2.69* 3.26* 3.29*  --  3.83* 4.54*  CALCIUM 8.2* 8.1* 8.2* 8.1* 8.0*  --  8.0* 7.6*   CBC Recent Labs  Lab 05/31/19 1020 06/01/19 0313 06/02/19 0251 06/03/19 0659  WBC 7.8 8.5 8.8 9.3  NEUTROABS  --   --   --  6.0  HGB 8.1* 7.7* 7.3* 7.1*  HCT 25.2* 24.7* 23.5* 23.6*  MCV 92.6 91.8 92.2 95.9  PLT 283 282 256 209    Medications:    . bisacodyl  10 mg Oral Daily  . Chlorhexidine Gluconate Cloth  6 each Topical Daily  . feeding supplement (NEPRO CARB STEADY)  237 mL Oral BID BM  . feeding supplement (PRO-STAT SUGAR FREE 64)  30 mL Oral BID  . ferrous sulfate  325 mg Oral Q breakfast  . heparin injection (subcutaneous)  5,000 Units Subcutaneous Q8H  . insulin aspart  0-24 Units Subcutaneous TID AC & HS  . ipratropium-albuterol  3 mL Nebulization BID  . levothyroxine  50 mcg Oral QAC breakfast  . metoprolol tartrate  25 mg Oral BID  . multivitamin with minerals  1 tablet Oral Daily  . nicotine  21 mg Transdermal Daily  . pantoprazole (PROTONIX) IV  40 mg Intravenous Q12H  . senna-docusate  1 tablet Oral QHS  . sodium bicarbonate  1,300 mg Oral BID  . sodium zirconium cyclosilicate  10 g Oral BID      Madelon Lips, MD 06/03/2019, 1:21 PM

## 2019-06-03 DEATH — deceased

## 2019-06-04 LAB — BASIC METABOLIC PANEL
Anion gap: 12 (ref 5–15)
BUN: 90 mg/dL — ABNORMAL HIGH (ref 8–23)
CO2: 18 mmol/L — ABNORMAL LOW (ref 22–32)
Calcium: 7.4 mg/dL — ABNORMAL LOW (ref 8.9–10.3)
Chloride: 106 mmol/L (ref 98–111)
Creatinine, Ser: 5 mg/dL — ABNORMAL HIGH (ref 0.61–1.24)
GFR calc Af Amer: 13 mL/min — ABNORMAL LOW (ref >60)
GFR calc non Af Amer: 11 mL/min — ABNORMAL LOW (ref >60)
Glucose, Bld: 84 mg/dL (ref 70–99)
Potassium: 5.4 mmol/L — ABNORMAL HIGH (ref 3.5–5.1)
Sodium: 136 mmol/L (ref 135–145)

## 2019-06-04 LAB — CBC WITH DIFFERENTIAL/PLATELET
Abs Immature Granulocytes: 0 10*3/uL (ref 0.00–0.07)
Basophils Absolute: 0 10*3/uL (ref 0.0–0.1)
Basophils Relative: 0 %
Eosinophils Absolute: 0.1 10*3/uL (ref 0.0–0.5)
Eosinophils Relative: 1 %
HCT: 26.9 % — ABNORMAL LOW (ref 39.0–52.0)
Hemoglobin: 8.4 g/dL — ABNORMAL LOW (ref 13.0–17.0)
Lymphocytes Relative: 5 %
Lymphs Abs: 0.6 10*3/uL — ABNORMAL LOW (ref 0.7–4.0)
MCH: 29.1 pg (ref 26.0–34.0)
MCHC: 31.2 g/dL (ref 30.0–36.0)
MCV: 93.1 fL (ref 80.0–100.0)
Monocytes Absolute: 0.9 10*3/uL (ref 0.1–1.0)
Monocytes Relative: 8 %
Neutro Abs: 9.7 10*3/uL — ABNORMAL HIGH (ref 1.7–7.7)
Neutrophils Relative %: 86 %
Platelets: 250 10*3/uL (ref 150–400)
RBC: 2.89 MIL/uL — ABNORMAL LOW (ref 4.22–5.81)
RDW: 14.8 % (ref 11.5–15.5)
WBC: 11.3 10*3/uL — ABNORMAL HIGH (ref 4.0–10.5)
nRBC: 0 % (ref 0.0–0.2)
nRBC: 0 /100 WBC

## 2019-06-04 LAB — GLUCOSE, CAPILLARY
Glucose-Capillary: 89 mg/dL (ref 70–99)
Glucose-Capillary: 101 mg/dL — ABNORMAL HIGH (ref 70–99)
Glucose-Capillary: 140 mg/dL — ABNORMAL HIGH (ref 70–99)
Glucose-Capillary: 128 mg/dL — ABNORMAL HIGH (ref 70–99)

## 2019-06-04 MED ORDER — FUROSEMIDE 10 MG/ML IJ SOLN
40.0000 mg | Freq: Once | INTRAMUSCULAR | Status: AC
  Administered 2019-06-04: 40 mg via INTRAVENOUS
  Filled 2019-06-04: qty 4

## 2019-06-04 NOTE — Progress Notes (Addendum)
[facility] Progress Note    Assessment/ Plan:   1. Nonoliguric AoCKD4: labile but baseline recently around 2.5.  Likley multifactorial.  Obstruction ruled out with renal US but stent not able to be visualized.  CT abd/ pelvis with stent in correct place, no hydro.  Complements haven't resulted yet- resending. 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  AIN possible but unclear if switching from ceftaroline to another agent would be advisable- would recommend discussing with ID.  Will give one dose of IV Lasix today for SOB.  He is a poor dialysis candidate given mult recent admissions, disseminated MRSA,  SNF status prior to admission.  Pall care c/s, appreciate assistance.   2. Solitary L kidney, remote nephrectomy 3. Hyperkalemia, mild--> increase lokelma 4. Mild metabolic acidosis 5. Nephrolithiasis, current L ureteral stent; hx/o infected stones 6. Disseminated MRSA: empyema (s/p VATS--> decortication and wedge resection), septic emboly, epidural abscess, pyomyositis s/p I&D. On Ceftaroline.  7. Schizophrenia 8. COPD  Subjective:    Cr up to 5, CT abd/ pelvis neg for hydro and has ureteral stent in the right place.  Sputum culture with multiple species. Culturing.    Objective:   BP 135/63   Pulse 77   Temp 98.1 F (36.7 C) (Oral)   Resp 16   Ht 6' (1.829 m)   Wt 90.1 kg   SpO2 100%   BMI 26.94 kg/m   Intake/Output Summary (Last 24 hours) at 06/04/2019 1201 Last data filed at 06/04/2019 1950 Gross per 24 hour  Intake 250 ml  Output 300 ml  Net -50 ml   Weight change:   Physical Exam: Gen: sitting in bed NAD CVS: RRR no m/r/g Resp: on O2 Abd: nontender Ext: 1+ anasarca  Imaging: CT ABDOMEN PELVIS WO CONTRAST  Result Date: 06/04/2019 CLINICAL DATA:  Ureteral stent displacement. EXAM: CT ABDOMEN AND PELVIS WITHOUT CONTRAST TECHNIQUE: Multidetector CT imaging of the abdomen and pelvis was performed following the standard protocol without IV contrast.  COMPARISON:  Renal ultrasound 06/02/2019, CT 05/18/2019 FINDINGS: Lower chest: Left pleural effusion has improved from prior exam. Moderate volume of persistent pleural fluid and adjacent airspace disease. Curvilinear density in the pleural fluid may represent postsurgical suture. Small right pleural effusion and adjacent atelectasis, increased from prior CT. Hepatobiliary: Small low-density lesions within the liver, incompletely characterized without IV contrast but likely cysts. This is stable from prior exam. Stable 10 cm exophytic cyst from the left lobe of the liver. Slight high-density gallbladder contents without pericholecystic inflammation. Pancreas: Questionable fat stranding about the proximal pancreas, difficult to assess given motion. No pancreatic ductal dilatation. Spleen: Normal in size without focal abnormality. Adrenals/Urinary Tract: No adrenal nodule. Right nephrectomy. Left ureteral stent in place, proximal pigtail in the upper pole calyx, distal pigtail in the bladder. Cortical calcification in the lower left kidney is unchanged. Minimal left perinephric edema without hydronephrosis. No stones along the course of the stent. Urinary bladder is nondistended. Suggestion of bladder wall thickening and perivesicular fat stranding. Stomach/Bowel: No bowel obstruction, enteric contrast throughout the colon. The stomach is nondistended. There is new moderate length small bowel wall thickening involving the fourth portion of the duodenum and proximal jejunum with adjacent perienteric fat stranding. More distal small bowel are decompressed. Multifocal colonic diverticulosis without diverticulitis. Vascular/Lymphatic: Aortic atherosclerosis. No aortic aneurysm. Prominent retroperitoneal lymph nodes, likely reactive. There scattered calcified lymph nodes. Reproductive: Prostatic calcifications. Normal sized prostate gland. Other: Drainage catheter within the right abdominal wall musculature. Improvement in  heterogeneous thickening.  Persistent thickening and mild fat stranding superiorly. Small volume abdominopelvic ascites which is new from prior exam. Generalized subcutaneous emphysema about the body wall, also progressed. Musculoskeletal: Slight widening of the right L4-L5 facet with adjacent sclerosis, likely sequela of septic joint as seen on prior lumbar spine MRI. No evidence of perivertebral fluid collection on noncontrast exam. No new osseous abnormality. IMPRESSION: 1. Left ureteral stent in place without hydronephrosis. Minimal left perinephric edema. Bladder wall thickening with perivesicular fat stranding. 2. New moderate length small bowel wall thickening involving the fourth portion of the duodenum and proximal jejunum with adjacent perienteric fat stranding. Findings consistent with enteritis, new from prior imaging. 3. Questionable fat stranding about the proximal pancreas, difficult to assess given motion. Recommend correlation with pancreatic enzymes. 4. Small volume abdominopelvic ascites, new from prior exam. 5. Drainage catheter in the right abdominal wall musculature with decreased heterogeneous enlargement, mild residual heterogeneity and thickening superiorly. 6. Increased body wall edema since prior exam. Small right pleural effusion has progressed. 7. Persistent but decreased left pleural effusion and basilar airspace disease. 8. Additional stable chronic findings as described. Aortic Atherosclerosis (ICD10-I70.0). Electronically Signed   By: Keith Rake M.D.   On: 06/04/2019 00:06   DG Chest 2 View  Result Date: 06/03/2019 CLINICAL DATA:  Shortness of breath and chest pain. COPD. Pleural effusion. EXAM: CHEST - 2 VIEW COMPARISON:  1 day prior FINDINGS: Midline trachea. Borderline cardiomegaly. Small left pleural effusion, similar. Minimal loculation laterally. No pneumothorax. Primarily left-sided interstitial opacities, minimally improved. Airspace disease at the left lung base  similar. Left upper lobe pulmonary nodule again identified. IMPRESSION: Minimal improvement in left-sided interstitial opacities, favoring asymmetric pulmonary edema. Small left pleural effusion and adjacent airspace disease, similar. Electronically Signed   By: Abigail Miyamoto M.D.   On: 06/03/2019 10:13    Labs: BMET Recent Labs  Lab 05/30/19 0230 05/31/19 1020 06/01/19 0313 06/01/19 1328 06/01/19 1510 06/02/19 0251 06/03/19 0659 06/04/19 0635  NA 135 137 135 134*  --  135 137 136  K 4.7 5.0 5.4* 6.3* 5.3* 5.2* 5.4* 5.4*  CL 108 110 107 108  --  106 110 106  CO2 20* 19* 19* 18*  --  20* 20* 18*  GLUCOSE 103* 103* 87 118*  --  90 100* 84  BUN 43* 44* 54* 60*  --  65* 80* 90*  CREATININE 2.32* 2.69* 3.26* 3.29*  --  3.83* 4.54* 5.00*  CALCIUM 8.1* 8.2* 8.1* 8.0*  --  8.0* 7.6* 7.4*   CBC Recent Labs  Lab 06/01/19 0313 06/02/19 0251 06/03/19 0659 06/04/19 0635  WBC 8.5 8.8 9.3 11.3*  NEUTROABS  --   --  6.0 9.7*  HGB 7.7* 7.3* 7.1* 8.4*  HCT 24.7* 23.5* 23.6* 26.9*  MCV 91.8 92.2 95.9 93.1  PLT 282 256 209 250    Medications:    . bisacodyl  10 mg Oral Daily  . Chlorhexidine Gluconate Cloth  6 each Topical Daily  . feeding supplement (NEPRO CARB STEADY)  237 mL Oral BID BM  . feeding supplement (PRO-STAT SUGAR FREE 64)  30 mL Oral BID  . ferrous sulfate  325 mg Oral Q breakfast  . furosemide  40 mg Intravenous Once  . heparin injection (subcutaneous)  5,000 Units Subcutaneous Q8H  . insulin aspart  0-24 Units Subcutaneous TID AC & HS  . ipratropium-albuterol  3 mL Nebulization BID  . levothyroxine  50 mcg Oral QAC breakfast  . metoprolol tartrate  25 mg Oral  BID  . multivitamin with minerals  1 tablet Oral Daily  . nicotine  21 mg Transdermal Daily  . pantoprazole (PROTONIX) IV  40 mg Intravenous Q12H  . senna-docusate  1 tablet Oral QHS  . sodium bicarbonate  1,300 mg Oral BID  . sodium zirconium cyclosilicate  10 g Oral BID      Madelon Lips,  MD 06/04/2019, 12:01 PM

## 2019-06-04 NOTE — Progress Notes (Signed)
PROGRESS NOTE  Alan Small SWH:[id number] DOB: [birth year] DOA: 05/14/2019 PCP: [name], MD  Brief History   Alan Small a 64 y.o.malewith medical history significant ofsolitary left kidney, CKD stage IV, left staghorn calculus SP stenting, urethral stricture SP urethroplasty, schizophrenia, clinically diagnosed COPD, depression.  Patient was admitted at [facility] between February 10, 2019 and March 02, 2019. Patient was treated for pyelonephritis and retroperitoneal fluid collection concerning for abscess. Urine culture grew MDR E. coli. Patient underwent aspiration of the abscess on 9/17 with JP drain placement. Patient was started on IV imipenem with a PICC line. Summary ofhisactive problems in the hospital is as following.Underwent left percutaneous nephrolithotomy 03/28/2019 and ureteral stent placement at the same time. Underwent laser lithotripsy and basket extraction on 04/05/2019. Patient was recommended to continue the antibiotics until April 16, 2019.  Patient presented to [facility] on 05/11/2019 with shortness of breath left leg pain and fever and nausea and vomiting. CT scan showed evidence of stone on the left side. Patient was initially placed on the BiPAP. His oxygenation worsened and patient actually required intubation in the emergency department. Urology was consulted and patient underwent urgent cystoscopy and left ureteral stent placement. Patient was started on broad-spectrum antibiotics and his blood cultures came back positive for MRSA. Patient was extubated on 05/20/2019 and since then has remained on nasal cannula. Patient's PICC line was removed on 05/12/2019 and a central line was placed which was removed on 05/18/2019. Foley catheter was inserted on 05/11/2019 after the urology procedure and removed on 05/18/2019. TEE showed no evidence of endocarditis but the patient does have evidence of septic emboli with cavitary lesions on bilateral  lung parenchyma therefore patient is treated as infective endocarditis. 05/19/2019 left thoracentesis/pleural fluid culture came back positive for MRSA. Report showed loculated effusion post procedure. 05/20/2019 had pyomyositis underwent right-sided drain placement after incision and debridement. -Due to patient's complaint of back pain MRI thoracic and lumbar spine were performed on 05/21/2019 which showed evidence of epidural abscess as well as facet joint infection. [facility] neurosurgery was consulted who recommended conservative management. Dr. [name] was on call. -Due to worsening shortness of breath CT chest performed on 05/22/2019 showed evidence of recollection of pleural fluid. Chest tube was inserted by PCCM on 05/22/2019. -VATS 12/23 - tolerated well; on Ceftaroline now. Clinically improving.  Hemoglobin 12/29 8.5 from 6.7 post 1 unit PRBC transfusion 12/28.    Consultants  . Nephrology . Infectious Disease . General Surgery . Urology  Antibiotics   Anti-infectives (From admission, onward)   Start     Dose/Rate Route Frequency Ordered Stop   06/01/19 1400  ceftaroline (TEFLARO) 300 mg in sodium chloride 0.9 % 250 mL IVPB     300 mg 125 mL/hr over 120 Minutes Intravenous Every 8 hours 06/01/19 0818     05/28/19 1400  ceftaroline (TEFLARO) 400 mg in sodium chloride 0.9 % 250 mL IVPB  Status:  Discontinued     400 mg 125 mL/hr over 120 Minutes Intravenous Every 8 hours 05/28/19 1009 06/01/19 0819   05/26/19 1400  ceftaroline (TEFLARO) 300 mg in sodium chloride 0.9 % 250 mL IVPB  Status:  Discontinued     300 mg 125 mL/hr over 120 Minutes Intravenous Every 8 hours 05/26/19 0731 05/28/19 1009   05/19/2019 2200  ceftaroline (TEFLARO) 400 mg in sodium chloride 0.9 % 250 mL IVPB  Status:  Discontinued     400 mg 250 mL/hr over 60 Minutes Intravenous Every 8 hours 05/21/2019  1050 05/26/19 0731   05/15/2019 1342  ceFAZolin (ANCEF) IVPB 2g/100 mL premix     2 g 200 mL/hr over 30 Minutes  Intravenous 30 min pre-op 05/16/2019 1342 05/29/2019 1454   05/07/2019 2245  ceftaroline (TEFLARO) 600 mg in sodium chloride 0.9 % 250 mL IVPB  Status:  Discontinued     600 mg 250 mL/hr over 60 Minutes Intravenous Every 12 hours 05/21/2019 2231 05/11/2019 2239   05/17/2019 2245  linezolid (ZYVOX) IVPB 600 mg  Status:  Discontinued     600 mg 300 mL/hr over 60 Minutes Intravenous Every 12 hours 06/01/2019 2231 05/17/2019 1050   05/22/2019 2245  ceftaroline (TEFLARO) 400 mg in sodium chloride 0.9 % 250 mL IVPB  Status:  Discontinued     400 mg 250 mL/hr over 60 Minutes Intravenous Every 12 hours 05/20/2019 2239 05/15/2019 1050    .  Subjective  The patient is resting comfortably in bed. No new complaints.   Objective   Vitals:  Vitals:   06/04/19 1033 06/04/19 1210  BP: 135/63 112/64  Pulse: 77 69  Resp:  17  Temp:  98.5 F (36.9 C)  SpO2:  98%    Exam:  Constitutional:  The patient is awake, alert, and oriented x 3. No acute distress. Respiratory:  No increased work of breathing. No wheezes, rales, or rhonchi No tactile fremitus Cardiovascular:  Regular rate and rhythm No murmurs, ectopy, or gallups. No lateral PMI. No thrills. Abdomen:  Abdomen is soft, non-tender, non-distended No hernias, masses, or organomegaly Normoactive bowel sounds.  Musculoskeletal:  No cyanosis, clubbing, or edema Skin:  No rashes, lesions, ulcers palpation of skin: no induration or nodules Neurologic:  CN 2-12 intact Sensation all 4 extremities intact Psychiatric:  Mental status Mood, affect appropriate Orientation to person, place, time  judgment and insight appear intact  I have personally reviewed the following:   Today's Data  . Vitals, BMP, CBC  Micro Data  . Urine culture has grown out more than 100K of klebsiella pneumoniae, sensitivities are pending.  Scheduled Meds: . bisacodyl  10 mg Oral Daily  . Chlorhexidine Gluconate Cloth  6 each Topical Daily  . feeding supplement (NEPRO CARB  STEADY)  237 mL Oral BID BM  . feeding supplement (PRO-STAT SUGAR FREE 64)  30 mL Oral BID  . ferrous sulfate  325 mg Oral Q breakfast  . heparin injection (subcutaneous)  5,000 Units Subcutaneous Q8H  . insulin aspart  0-24 Units Subcutaneous TID AC & HS  . ipratropium-albuterol  3 mL Nebulization BID  . levothyroxine  50 mcg Oral QAC breakfast  . metoprolol tartrate  25 mg Oral BID  . multivitamin with minerals  1 tablet Oral Daily  . nicotine  21 mg Transdermal Daily  . pantoprazole (PROTONIX) IV  40 mg Intravenous Q12H  . senna-docusate  1 tablet Oral QHS  . sodium bicarbonate  1,300 mg Oral BID  . sodium zirconium cyclosilicate  10 g Oral BID   Continuous Infusions: . sodium chloride    . ceftaroline (TEFLARO) 400 mg IVPB 300 mg (06/04/19 1427)    Principal Problem:   Empyema of pleural space (HCC) Active Problems:   Schizophrenia ([place])   Hypothyroidism   Severe sepsis (HCC)   Pyohydronephrosis   Kidney stone   Chronic kidney disease, stage IV (severe) (HCC)   COPD (chronic obstructive pulmonary disease) (HCC)   Atrial flutter (HCC)   MRSA bacteremia   Septic embolism (HCC)   Epidural abscess  ETOH abuse   LOS: 11 days   A & P  Severe sepsis (POA)with septic shock secondary to disseminated MRSA infection thought to have stemmed from a tunneled PICC line that was not maintained or removed following 8-week course of IV antibiotics: Resolved sepsis. Pt also has pyomyositis (POA) for which he has had a right flank drain placed. He also has a left sided empyema s/p thoracentesis/VATS (POA). Chest tube has been placed and now removed. CTS has signed off. There is also an epidural abscess that is being addressed conservatively as recommended by neurosurgery. Urine culture collected on 06/02/2018 has grown out klebsiella pneumoniae. Pt is still receiving Teflaro. Sensitivities are pending. Urology has also signed off. Blood cultures drawn on admission have grown out MRSA. Follow  up blood cultures drawn on 05/16/2019 have had no growth. He will require 6 weeks of IV Teflaro. He will need to follow up with Dr. Delaine Lame as outpatient. Palliative care has been consulted.  UTI: Urine culture has grown out more than 100 thousand CFU of klebsiella pneumoniae. The patient is on Teflaro. Await sensitivities.  Anasarca: Diffuse edema with worsening renal function. Holding off IV fluids. Nephrology consulted and following.  Worsening nonoliguric AKI on CKD 4 in the setting of solitary left kidney: Nephrology consulted. Pt is a poor candidate for HD. Palliative care has been consulted. Avoid nephrotoxins and hypotension. Renal ultrasound done on 05/23/2019 showed mild left hydronephrosis/pelviectasis, post right nephrectomy, mild bladder wall thickening. Baseline creatinine appears to be 2.3 with GFR in the 28. Creatinine worsening daily. Creatinine today is 5.0. Rate of increase does seem to be declining, however.   Non anion gap metabolic acidosis likely in the setting of worsening renal function: Improving on p.o. sodium bicarb 1300 mg twice daily. Managed by nephrology.  Refractory hyperkalemia in the setting of worsening renal function: Potassium 5.4>> 5.4.  Continue Lokelma 10 g daily as recommended by nephrology.  Obstructing/infected renal calculus, Concurrent VRE UTI, likely POA, S/P emergency ureteral stent placement: Cystoscopy on 05/11/2019 and also was found to have a bulbar urethral stricture. The stricture was dilated. The stent was placed in the left ureter. The urine culture on 05/11/2019 was negative. A repeat urine culture was sent on 05/16/2019 and  positive for VRE. On ceftaroline per ID. Urine culture collected on 06/02/2018 has grown out more than 100 thousand CFU of klebsiella pneumoniae. The patient is on Teflaro.   A. fib/flutter with rapid ventricular rate, resolved: Rate is currently controlled on p.o. Lopressor 25 mg twice daily. Anticoagulation not  recommended due to history of fall and anemia. Echo with normal LV function. Continue to monitor on telemetry.  Acute hypoxic respiratory failure likely in the setting of left empyema and pulmonary edema: Not on oxygen supplementation at baseline. Currently requiring at least 2 L to maintain O2 saturation greater than 92%. Continue pulmonary toilet, incentive spirometer, flutter valve, and IV antibiotics.  Acute blood loss anemia/iron deficiency anemia/Anemia of chronic disease 2/2 CKD: The patient has received 1 unit PRBC. Monitor hemoglobin. Multifactorial in the setting of worsening renal function, internal hemorrhoids with positive FOBT, iron deficiency. He has received 1 dose of Feraheme 520 mg once on 06/02/2019. He will be continued on daily ferrous sulfate. Transfuse for hemoglobin less than 7.0.  Acute transaminitis: Acute hepatitis panel negative. CT abdomen and pelvis done on 05/18/2019 without contrast: Multiple low-density lesions again seen within the liver, unchanged, likely cysts. Large exophytic cyst off the left. The hepatic lobe is stable. Gallbladder unremarkable.  Trend LFTs and continue to avoid hepatotoxins.  Alcohol withdrawal syndrome, resolved: Out of alcohol withdrawal window.  No sign of alcohol withdrawal.  Paranoid schizophrenia/chronic anxiety: Stable.  Severe physical debility: PT OT assessed and recommended SNF. CSW assisting with placement. Continue PT OT with assistance and fall precautions. Out of bed to chair as tolerated.  I have seen and examined this patient myself. I have spent 32 minutes in his evaluation and care.  DVT prophylaxis:Heparin sq TID Code Status:Full Family Communication:None at bedside Disposition Plan: Will plan to DC once CTS and general surgery sign off and patient is hemodynamically stable. Kwan Shellhammer, DO [facility] Direct contact: see [email or web address]  7PM-7AM contact night coverage as above 06/04/2019, 7:41 PM  LOS:  11 days

## 2019-06-05 LAB — BASIC METABOLIC PANEL
Anion gap: 10 (ref 5–15)
BUN: 102 mg/dL — ABNORMAL HIGH (ref 8–23)
CO2: 18 mmol/L — ABNORMAL LOW (ref 22–32)
Calcium: 7.4 mg/dL — ABNORMAL LOW (ref 8.9–10.3)
Chloride: 104 mmol/L (ref 98–111)
Creatinine, Ser: 5.59 mg/dL — ABNORMAL HIGH (ref 0.61–1.24)
GFR calc Af Amer: 11 mL/min — ABNORMAL LOW (ref >60)
GFR calc non Af Amer: 10 mL/min — ABNORMAL LOW (ref >60)
Glucose, Bld: 97 mg/dL (ref 70–99)
Potassium: 5.4 mmol/L — ABNORMAL HIGH (ref 3.5–5.1)
Sodium: 132 mmol/L — ABNORMAL LOW (ref 135–145)

## 2019-06-05 LAB — GLUCOSE, CAPILLARY
Glucose-Capillary: 91 mg/dL (ref 70–99)
Glucose-Capillary: 94 mg/dL (ref 70–99)
Glucose-Capillary: 100 mg/dL — ABNORMAL HIGH (ref 70–99)
Glucose-Capillary: 91 mg/dL (ref 70–99)

## 2019-06-05 LAB — CBC WITH DIFFERENTIAL/PLATELET
Abs Immature Granulocytes: 1.07 10*3/uL — ABNORMAL HIGH (ref 0.00–0.07)
Basophils Absolute: 0 10*3/uL (ref 0.0–0.1)
Basophils Relative: 0 %
Eosinophils Absolute: 0.2 10*3/uL (ref 0.0–0.5)
Eosinophils Relative: 2 %
HCT: 23 % — ABNORMAL LOW (ref 39.0–52.0)
Hemoglobin: 7.2 g/dL — ABNORMAL LOW (ref 13.0–17.0)
Immature Granulocytes: 11 %
Lymphocytes Relative: 10 %
Lymphs Abs: 1 10*3/uL (ref 0.7–4.0)
MCH: 28.9 pg (ref 26.0–34.0)
MCHC: 31.3 g/dL (ref 30.0–36.0)
MCV: 92.4 fL (ref 80.0–100.0)
Monocytes Absolute: 1 10*3/uL (ref 0.1–1.0)
Monocytes Relative: 10 %
Neutro Abs: 6.3 10*3/uL (ref 1.7–7.7)
Neutrophils Relative %: 67 %
Platelets: 222 10*3/uL (ref 150–400)
RBC: 2.49 MIL/uL — ABNORMAL LOW (ref 4.22–5.81)
RDW: 15 % (ref 11.5–15.5)
WBC: 9.6 10*3/uL (ref 4.0–10.5)
nRBC: 0 % (ref 0.0–0.2)

## 2019-06-05 LAB — C4 COMPLEMENT
Complement C4, Body Fluid: 23 mg/dL (ref 12–38)
Complement C4, Body Fluid: 19 mg/dL (ref 12–38)

## 2019-06-05 LAB — C3 COMPLEMENT
C3 Complement: 137 mg/dL (ref 82–167)
C3 Complement: 100 mg/dL (ref 82–167)

## 2019-06-05 MED ORDER — SODIUM CHLORIDE 0.9 % IV SOLN
1.0000 g | Freq: Two times a day (BID) | INTRAVENOUS | Status: DC
  Administered 2019-06-05 – 2019-06-08 (×7): 1 g via INTRAVENOUS
  Filled 2019-06-05 (×8): qty 1

## 2019-06-05 NOTE — Plan of Care (Signed)
Poc progressing.  

## 2019-06-05 NOTE — Progress Notes (Signed)
PROGRESS NOTE  Alan Small LKT:[id number] DOB: [birth year] DOA: 05/14/2019 PCP: [name], MD  Brief History   Alan Gin Arnoldis a 64 y.o.malewith medical history significant ofsolitary left kidney, CKD stage IV, left staghorn calculus SP stenting, urethral stricture SP urethroplasty, schizophrenia, clinically diagnosed COPD, depression.  Patient was admitted at [facility] between February 10, 2019 and March 02, 2019. Patient was treated for pyelonephritis and retroperitoneal fluid collection concerning for abscess. Urine culture grew MDR E. coli. Patient underwent aspiration of the abscess on 9/17 with JP drain placement. Patient was started on IV imipenem with a PICC line. Summary ofhisactive problems in the hospital is as following.Underwent left percutaneous nephrolithotomy 03/28/2019 and ureteral stent placement at the same time. Underwent laser lithotripsy and basket extraction on 04/05/2019. Patient was recommended to continue the antibiotics until April 16, 2019.  Patient presented to [facility] on 05/11/2019 with shortness of breath left leg pain and fever and nausea and vomiting. CT scan showed evidence of stone on the left side. Patient was initially placed on the BiPAP. His oxygenation worsened and patient actually required intubation in the emergency department. Urology was consulted and patient underwent urgent cystoscopy and left ureteral stent placement. Patient was started on broad-spectrum antibiotics and his blood cultures came back positive for MRSA. Patient was extubated on 05/20/2019 and since then has remained on nasal cannula. Patient's PICC line was removed on 05/12/2019 and a central line was placed which was removed on 05/18/2019. Foley catheter was inserted on 05/11/2019 after the urology procedure and removed on 05/18/2019. TEE showed no evidence of endocarditis but the patient does have evidence of septic emboli with cavitary lesions on bilateral  lung parenchyma therefore patient is treated as infective endocarditis. 05/19/2019 left thoracentesis/pleural fluid culture came back positive for MRSA. Report showed loculated effusion post procedure. 05/20/2019 had pyomyositis underwent right-sided drain placement after incision and debridement. -Due to patient's complaint of back pain MRI thoracic and lumbar spine were performed on 05/21/2019 which showed evidence of epidural abscess as well as facet joint infection. [facility] neurosurgery was consulted who recommended conservative management. Dr. [name] was on call. -Due to worsening shortness of breath CT chest performed on 05/22/2019 showed evidence of recollection of pleural fluid. Chest tube was inserted by PCCM on 05/22/2019. -VATS 12/23 - tolerated well; on Ceftaroline now. Clinically improving.  Hemoglobin 12/29 8.5 from 6.7 post 1 unit PRBC transfusion 12/28.    Consultants  . Nephrology . Infectious Disease . General Surgery . Urology  Antibiotics   Anti-infectives (From admission, onward)   Start     Dose/Rate Route Frequency Ordered Stop   06/05/19 1245  meropenem (MERREM) 1 g in sodium chloride 0.9 % 100 mL IVPB     1 g 200 mL/hr over 30 Minutes Intravenous Every 12 hours 06/05/19 1237     06/01/19 1400  ceftaroline (TEFLARO) 300 mg in sodium chloride 0.9 % 250 mL IVPB     300 mg 125 mL/hr over 120 Minutes Intravenous Every 8 hours 06/01/19 0818     05/28/19 1400  ceftaroline (TEFLARO) 400 mg in sodium chloride 0.9 % 250 mL IVPB  Status:  Discontinued     400 mg 125 mL/hr over 120 Minutes Intravenous Every 8 hours 05/28/19 1009 06/01/19 0819   05/26/19 1400  ceftaroline (TEFLARO) 300 mg in sodium chloride 0.9 % 250 mL IVPB  Status:  Discontinued     300 mg 125 mL/hr over 120 Minutes Intravenous Every 8 hours 05/26/19 0731 05/28/19 1009  05/14/2019 2200  ceftaroline (TEFLARO) 400 mg in sodium chloride 0.9 % 250 mL IVPB  Status:  Discontinued     400 mg 250 mL/hr over 60  Minutes Intravenous Every 8 hours 05/05/2019 1050 05/26/19 0731   05/20/2019 1342  ceFAZolin (ANCEF) IVPB 2g/100 mL premix     2 g 200 mL/hr over 30 Minutes Intravenous 30 min pre-op 05/16/2019 1342 05/23/2019 1454   05/28/2019 2245  ceftaroline (TEFLARO) 600 mg in sodium chloride 0.9 % 250 mL IVPB  Status:  Discontinued     600 mg 250 mL/hr over 60 Minutes Intravenous Every 12 hours 05/06/2019 2231 05/30/2019 2239   05/26/2019 2245  linezolid (ZYVOX) IVPB 600 mg  Status:  Discontinued     600 mg 300 mL/hr over 60 Minutes Intravenous Every 12 hours 05/07/2019 2231 05/21/2019 1050   05/08/2019 2245  ceftaroline (TEFLARO) 400 mg in sodium chloride 0.9 % 250 mL IVPB  Status:  Discontinued     400 mg 250 mL/hr over 60 Minutes Intravenous Every 12 hours 05/31/2019 2239 05/25/19 1050     Subjective  The patient is resting comfortably in bed. No new complaints.   Objective   Vitals:  Vitals:   06/05/19 1202 06/05/19 1300  BP: 105/62   Pulse: 71 71  Resp: 20 16  Temp: 97.6 F (36.4 C)   SpO2: 98% 100%    Exam:  Constitutional:  The patient is awake, alert, and oriented x 3. No acute distress. Respiratory:  No increased work of breathing. No wheezes, rales, or rhonchi No tactile fremitus Cardiovascular:  Regular rate and rhythm No murmurs, ectopy, or gallups. No lateral PMI. No thrills. Abdomen:  Abdomen is soft, non-tender, non-distended No hernias, masses, or organomegaly Normoactive bowel sounds.  Musculoskeletal:  No cyanosis, clubbing, or edema Skin:  No rashes, lesions, ulcers palpation of skin: no induration or nodules Neurologic:  CN 2-12 intact Sensation all 4 extremities intact Psychiatric:  Mental status Mood, affect appropriate Orientation to person, place, time  judgment and insight appear intact  I have personally reviewed the following:   Today's Data  . Vitals, BMP, CBC  Micro Data  . Urine culture has grown out more than 100K of klebsiella pneumoniae. It has been  shown to be sensitive to imipenem.  Scheduled Meds: . bisacodyl  10 mg Oral Daily  . Chlorhexidine Gluconate Cloth  6 each Topical Daily  . feeding supplement (NEPRO CARB STEADY)  237 mL Oral BID BM  . feeding supplement (PRO-STAT SUGAR FREE 64)  30 mL Oral BID  . ferrous sulfate  325 mg Oral Q breakfast  . heparin injection (subcutaneous)  5,000 Units Subcutaneous Q8H  . insulin aspart  0-24 Units Subcutaneous TID AC & HS  . ipratropium-albuterol  3 mL Nebulization BID  . levothyroxine  50 mcg Oral QAC breakfast  . metoprolol tartrate  25 mg Oral BID  . multivitamin with minerals  1 tablet Oral Daily  . nicotine  21 mg Transdermal Daily  . pantoprazole (PROTONIX) IV  40 mg Intravenous Q12H  . senna-docusate  1 tablet Oral QHS  . sodium bicarbonate  1,300 mg Oral BID  . sodium zirconium cyclosilicate  10 g Oral BID   Continuous Infusions: . sodium chloride    . ceftaroline (TEFLARO) 400 mg IVPB 300 mg (06/05/19 2505)  . meropenem (MERREM) IV      Principal Problem:   Empyema of pleural space (HCC) Active Problems:   Schizophrenia (HCC)   Hypothyroidism  Severe sepsis (HCC)   Pyohydronephrosis   Kidney stone   Chronic kidney disease, stage IV (severe) (HCC)   COPD (chronic obstructive pulmonary disease) (HCC)   Atrial flutter (HCC)   MRSA bacteremia   Septic embolism (HCC)   Epidural abscess   ETOH abuse   LOS: 12 days   A & P  Severe sepsis (POA)with septic shock secondary to disseminated MRSA infection thought to have stemmed from a tunneled PICC line that was not maintained or removed following 8-week course of IV antibiotics: Resolved sepsis. Pt also has pyomyositis (POA) for which he has had a right flank drain placed. He also has a left sided empyema s/p thoracentesis/VATS (POA). Chest tube has been placed and now removed. CTS has signed off. There is also an epidural abscess that is being addressed conservatively as recommended by neurosurgery. Urine culture  collected on 06/02/2018 has grown out klebsiella pneumoniae. Pt is still receiving Teflaro. Sensitivities are pending. Urology has also signed off. Blood cultures drawn on admission have grown out MRSA. Follow up blood cultures drawn on 05/13/2019 have had no growth. He will require 6 weeks of IV Teflaro. He will need to follow up with Dr. Delaine Lame as outpatient. Palliative care has been consulted.  UTI: Urine culture has grown out more than 100 thousand CFU of klebsiella pneumoniae. The organism has been shown to be sensitive to imipanem. He has been started on cefepime.  Anasarca: Diffuse edema with worsening renal function. Holding off IV fluids. Nephrology consulted and following.  Worsening nonoliguric AKI on CKD 4 in the setting of solitary left kidney: Nephrology consulted. Pt is a poor candidate for HD. Palliative care has been consulted. Avoid nephrotoxins and hypotension. Renal ultrasound done on 05/23/2019 showed mild left hydronephrosis/pelviectasis, post right nephrectomy, mild bladder wall thickening. Baseline creatinine appears to be 2.3 with GFR in the 28. Creatinine worsening daily. Creatinine today is 5.59. Rate of increase does seem to be declining, however. Continue to monitor electrolytes, creatinine, and volume status.  Non anion gap metabolic acidosis likely in the setting of worsening renal function: Improving on p.o. sodium bicarb 1300 mg twice daily. Managed by nephrology. Monitor.  Refractory hyperkalemia in the setting of worsening renal function: Potassium 5.4>> 5.4.  Continue Lokelma 10 g daily as recommended by nephrology.  Obstructing/infected renal calculus, Concurrent VRE UTI, likely POA, S/P emergency ureteral stent placement: Cystoscopy on 05/11/2019 and also was found to have a bulbar urethral stricture. The stricture was dilated. The stent was placed in the left ureter. The urine culture on 05/11/2019 was negative. A repeat urine culture was sent on 05/16/2019  and  positive for VRE. On ceftaroline per ID. Urine culture collected on 06/02/2018 has grown out more than 100 thousand CFU of klebsiella pneumoniae which is sensitive to imipenem. The patient is on Teflaro and cefepime.   A. fib/flutter with rapid ventricular rate, resolved: Rate is currently controlled on p.o. Lopressor 25 mg twice daily. Anticoagulation not recommended due to history of fall and anemia. Echo with normal LV function. Continue to monitor on telemetry.  Acute hypoxic respiratory failure likely in the setting of left empyema and pulmonary edema: Not on oxygen supplementation at baseline. Currently requiring at least 2 L to maintain O2 saturation greater than 92%. Continue pulmonary toilet, incentive spirometer, flutter valve, and IV antibiotics.  Acute blood loss anemia/iron deficiency anemia/Anemia of chronic disease 2/2 CKD: The patient has received 1 unit PRBC. Monitor hemoglobin. Multifactorial in the setting of worsening renal function, internal hemorrhoids  with positive FOBT, iron deficiency. He has received 1 dose of Feraheme 520 mg once on 06/02/2019. He will be continued on daily ferrous sulfate. Transfuse for hemoglobin less than 7.0. hemoglobin is 7.2 today.  Acute transaminitis: Acute hepatitis panel negative. CT abdomen and pelvis done on 05/18/2019 without contrast: Multiple low-density lesions again seen within the liver, unchanged, likely cysts. Large exophytic cyst off the left. The hepatic lobe is stable. Gallbladder unremarkable. Trend LFTs and continue to avoid hepatotoxins.  Alcohol withdrawal syndrome, resolved: Out of alcohol withdrawal window.  No sign of alcohol withdrawal.  Paranoid schizophrenia/chronic anxiety: Stable.  Severe physical debility: PT OT assessed and recommended SNF. CSW assisting with placement. Continue PT OT with assistance and fall precautions. Out of bed to chair as tolerated.  I have seen and examined this patient myself. I have  spent 30 minutes in his evaluation and care.  DVT prophylaxis:Heparin sq TID Code Status:Full Family Communication:None at bedside Disposition Plan: Will plan to DC once CTS and general surgery sign off and patient is hemodynamically stable. Devetta Hagenow, DO [facility] Direct contact: see [email or web address]  7PM-7AM contact night coverage as above 06/05/2019, 1:54 PM  LOS: 11 days

## 2019-06-05 NOTE — Progress Notes (Signed)
[facility] Progress Note    Assessment/ Plan:   1. Nonoliguric AoCKD4: labile but baseline recently around 2.5.  Likley multifactorial.  Obstruction ruled out with renal US but stent not able to be visualized.  CT abd/ pelvis with stent in correct place, no hydro.  Complements haven't resulted yet- resending. 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  AIN possible but unclear if switching from ceftaroline to another agent would be advisable- would recommend discussing with ID.  S/p one dose of IV Lasix for SOB yesterday 06/04/2019 with improvement  He is a poor dialysis candidate given mult recent admissions, disseminated MRSA,  SNF status prior to admission.  Pall care c/s, appreciate assistance.  If goal is for full scope care will make NPO past midnight tonight in case needs HD cath.   2. Solitary L kidney, remote nephrectomy 3. Hyperkalemia, mild--> increase lokelma 10 mg BID 4. Mild metabolic acidosis, on bicarb 5. Nephrolithiasis, current L ureteral stent; hx/o infected stones 6. Disseminated MRSA: empyema (s/p VATS--> decortication and wedge resection), septic emboly, epidural abscess, pyomyositis s/p I&D. On Ceftaroline.  7. Schizophrenia 8. COPD 9. UTI- Klebsiella, awaiting sensitivities  Subjective:    BUN/Cr up, pt reports breathing better after lasix yesterday.  No other complaints.  No dysgeusia, anorexia, myoclonus.  Ureteral stent in place.  ? Has enteritis on CT- remains on ceftaroline    Objective:   BP 105/62 (BP Location: Left Arm)   Pulse 71   Temp 97.6 F (36.4 C) (Oral)   Resp 20   Ht 6' (1.829 m)   Wt 107.7 kg   SpO2 98%   BMI 32.20 kg/m   Intake/Output Summary (Last 24 hours) at 06/05/2019 1222 Last data filed at 06/05/2019 0200 Gross per 24 hour  Intake 360 ml  Output 280 ml  Net 80 ml   Weight change:   Physical Exam: Gen: sitting in bed NAD CVS: RRR no m/r/g Resp: on O2, Abd: nontender Ext: 1+ anasarca  Imaging: CT ABDOMEN PELVIS  WO CONTRAST  Result Date: 06/04/2019 CLINICAL DATA:  Ureteral stent displacement. EXAM: CT ABDOMEN AND PELVIS WITHOUT CONTRAST TECHNIQUE: Multidetector CT imaging of the abdomen and pelvis was performed following the standard protocol without IV contrast. COMPARISON:  Renal ultrasound 06/02/2019, CT 05/18/2019 FINDINGS: Lower chest: Left pleural effusion has improved from prior exam. Moderate volume of persistent pleural fluid and adjacent airspace disease. Curvilinear density in the pleural fluid may represent postsurgical suture. Small right pleural effusion and adjacent atelectasis, increased from prior CT. Hepatobiliary: Small low-density lesions within the liver, incompletely characterized without IV contrast but likely cysts. This is stable from prior exam. Stable 10 cm exophytic cyst from the left lobe of the liver. Slight high-density gallbladder contents without pericholecystic inflammation. Pancreas: Questionable fat stranding about the proximal pancreas, difficult to assess given motion. No pancreatic ductal dilatation. Spleen: Normal in size without focal abnormality. Adrenals/Urinary Tract: No adrenal nodule. Right nephrectomy. Left ureteral stent in place, proximal pigtail in the upper pole calyx, distal pigtail in the bladder. Cortical calcification in the lower left kidney is unchanged. Minimal left perinephric edema without hydronephrosis. No stones along the course of the stent. Urinary bladder is nondistended. Suggestion of bladder wall thickening and perivesicular fat stranding. Stomach/Bowel: No bowel obstruction, enteric contrast throughout the colon. The stomach is nondistended. There is new moderate length small bowel wall thickening involving the fourth portion of the duodenum and proximal jejunum with adjacent perienteric fat stranding. More distal small bowel are decompressed. Multifocal  colonic diverticulosis without diverticulitis. Vascular/Lymphatic: Aortic atherosclerosis. No aortic  aneurysm. Prominent retroperitoneal lymph nodes, likely reactive. There scattered calcified lymph nodes. Reproductive: Prostatic calcifications. Normal sized prostate gland. Other: Drainage catheter within the right abdominal wall musculature. Improvement in heterogeneous thickening. Persistent thickening and mild fat stranding superiorly. Small volume abdominopelvic ascites which is new from prior exam. Generalized subcutaneous emphysema about the body wall, also progressed. Musculoskeletal: Slight widening of the right L4-L5 facet with adjacent sclerosis, likely sequela of septic joint as seen on prior lumbar spine MRI. No evidence of perivertebral fluid collection on noncontrast exam. No new osseous abnormality. IMPRESSION: 1. Left ureteral stent in place without hydronephrosis. Minimal left perinephric edema. Bladder wall thickening with perivesicular fat stranding. 2. New moderate length small bowel wall thickening involving the fourth portion of the duodenum and proximal jejunum with adjacent perienteric fat stranding. Findings consistent with enteritis, new from prior imaging. 3. Questionable fat stranding about the proximal pancreas, difficult to assess given motion. Recommend correlation with pancreatic enzymes. 4. Small volume abdominopelvic ascites, new from prior exam. 5. Drainage catheter in the right abdominal wall musculature with decreased heterogeneous enlargement, mild residual heterogeneity and thickening superiorly. 6. Increased body wall edema since prior exam. Small right pleural effusion has progressed. 7. Persistent but decreased left pleural effusion and basilar airspace disease. 8. Additional stable chronic findings as described. Aortic Atherosclerosis (ICD10-I70.0). Electronically Signed   By: Keith Rake M.D.   On: 06/04/2019 00:06    Labs: BMET Recent Labs  Lab 05/31/19 1020 06/01/19 0313 06/01/19 1328 06/01/19 1510 06/02/19 0251 06/03/19 0659 06/04/19 0635  06/05/19 0529  NA 137 135 134*  --  135 137 136 132*  K 5.0 5.4* 6.3* 5.3* 5.2* 5.4* 5.4* 5.4*  CL 110 107 108  --  106 110 106 104  CO2 19* 19* 18*  --  20* 20* 18* 18*  GLUCOSE 103* 87 118*  --  90 100* 84 97  BUN 44* 54* 60*  --  65* 80* 90* 102*  CREATININE 2.69* 3.26* 3.29*  --  3.83* 4.54* 5.00* 5.59*  CALCIUM 8.2* 8.1* 8.0*  --  8.0* 7.6* 7.4* 7.4*   CBC Recent Labs  Lab 06/02/19 0251 06/03/19 0659 06/04/19 0635 06/05/19 0529  WBC 8.8 9.3 11.3* 9.6  NEUTROABS  --  6.0 9.7* 6.3  HGB 7.3* 7.1* 8.4* 7.2*  HCT 23.5* 23.6* 26.9* 23.0*  MCV 92.2 95.9 93.1 92.4  PLT 256 209 250 222    Medications:    . bisacodyl  10 mg Oral Daily  . Chlorhexidine Gluconate Cloth  6 each Topical Daily  . feeding supplement (NEPRO CARB STEADY)  237 mL Oral BID BM  . feeding supplement (PRO-STAT SUGAR FREE 64)  30 mL Oral BID  . ferrous sulfate  325 mg Oral Q breakfast  . heparin injection (subcutaneous)  5,000 Units Subcutaneous Q8H  . insulin aspart  0-24 Units Subcutaneous TID AC & HS  . ipratropium-albuterol  3 mL Nebulization BID  . levothyroxine  50 mcg Oral QAC breakfast  . metoprolol tartrate  25 mg Oral BID  . multivitamin with minerals  1 tablet Oral Daily  . nicotine  21 mg Transdermal Daily  . pantoprazole (PROTONIX) IV  40 mg Intravenous Q12H  . senna-docusate  1 tablet Oral QHS  . sodium bicarbonate  1,300 mg Oral BID  . sodium zirconium cyclosilicate  10 g Oral BID      Madelon Lips, MD 06/05/2019, 12:22 PM

## 2019-06-06 DIAGNOSIS — Z515 Encounter for palliative care: Secondary | ICD-10-CM

## 2019-06-06 DIAGNOSIS — Z7189 Other specified counseling: Secondary | ICD-10-CM

## 2019-06-06 LAB — BASIC METABOLIC PANEL
Anion gap: 10 (ref 5–15)
BUN: 112 mg/dL — ABNORMAL HIGH (ref 8–23)
CO2: 18 mmol/L — ABNORMAL LOW (ref 22–32)
Calcium: 7.3 mg/dL — ABNORMAL LOW (ref 8.9–10.3)
Chloride: 107 mmol/L (ref 98–111)
Creatinine, Ser: 6.26 mg/dL — ABNORMAL HIGH (ref 0.61–1.24)
GFR calc Af Amer: 10 mL/min — ABNORMAL LOW (ref >60)
GFR calc non Af Amer: 9 mL/min — ABNORMAL LOW (ref >60)
Glucose, Bld: 87 mg/dL (ref 70–99)
Potassium: 5.4 mmol/L — ABNORMAL HIGH (ref 3.5–5.1)
Sodium: 135 mmol/L (ref 135–145)

## 2019-06-06 LAB — CBC WITH DIFFERENTIAL/PLATELET
Abs Immature Granulocytes: 0.8 10*3/uL — ABNORMAL HIGH (ref 0.00–0.07)
Basophils Absolute: 0 10*3/uL (ref 0.0–0.1)
Basophils Relative: 0 %
Eosinophils Absolute: 0.3 10*3/uL (ref 0.0–0.5)
Eosinophils Relative: 3 %
HCT: 26.4 % — ABNORMAL LOW (ref 39.0–52.0)
Hemoglobin: 8 g/dL — ABNORMAL LOW (ref 13.0–17.0)
Lymphocytes Relative: 5 %
Lymphs Abs: 0.5 10*3/uL — ABNORMAL LOW (ref 0.7–4.0)
MCH: 28.8 pg (ref 26.0–34.0)
MCHC: 30.3 g/dL (ref 30.0–36.0)
MCV: 95 fL (ref 80.0–100.0)
Metamyelocytes Relative: 2 %
Monocytes Absolute: 0.4 10*3/uL (ref 0.1–1.0)
Monocytes Relative: 4 %
Myelocytes: 6 %
Neutro Abs: 7.9 10*3/uL — ABNORMAL HIGH (ref 1.7–7.7)
Neutrophils Relative %: 80 %
Platelets: 220 10*3/uL (ref 150–400)
RBC: 2.78 MIL/uL — ABNORMAL LOW (ref 4.22–5.81)
RDW: 15.5 % (ref 11.5–15.5)
WBC: 9.9 10*3/uL (ref 4.0–10.5)
nRBC: 0 % (ref 0.0–0.2)
nRBC: 0 /100 WBC

## 2019-06-06 LAB — GLUCOSE, CAPILLARY
Glucose-Capillary: 106 mg/dL — ABNORMAL HIGH (ref 70–99)
Glucose-Capillary: 89 mg/dL (ref 70–99)
Glucose-Capillary: 73 mg/dL (ref 70–99)
Glucose-Capillary: 80 mg/dL (ref 70–99)

## 2019-06-06 LAB — CULTURE, RESPIRATORY W GRAM STAIN: Special Requests: NORMAL

## 2019-06-06 LAB — URINE CULTURE: Culture: >=100000 — AB

## 2019-06-06 MED ORDER — SODIUM BICARBONATE 650 MG PO TABS
1300.0000 mg | ORAL_TABLET | Freq: Three times a day (TID) | ORAL | Status: DC
  Administered 2019-06-06 – 2019-06-07 (×5): 1300 mg via ORAL
  Filled 2019-06-06 (×5): qty 2

## 2019-06-06 MED ORDER — FUROSEMIDE 10 MG/ML IJ SOLN
80.0000 mg | Freq: Once | INTRAMUSCULAR | Status: AC
  Administered 2019-06-06: 80 mg via INTRAVENOUS
  Filled 2019-06-06: qty 8

## 2019-06-06 NOTE — Consult Note (Signed)
Consultation Note Date: 06/06/19  Patient Name: Alan Small  DOB: 20-Jan-1956  MRN: [id number]  Age / Sex: 64 y.o., male  PCP: [name], MD Referring Physician: Karie Kirks, DO  Reason for Consultation: Establishing goals of care  HPI/Patient Profile: 64 y.o. male  with past medical history of solitary left kidney, CKD stage IV, left staghorn calculus s/p stenting, urethral stricture s/p urethroplasty, schizophrenia, COPD, depression. Patient admitted to [facility] between February 10, 2019-March 02, 2019. Patient treated for pyelonephritis and retroperitoneal fluid collection concerning for abscess on 9/17 with JP drain placement. PICC placed and prolonged course of antibiotics. Current hospitalization due to severe sepsis with septic shock secondary to disseminated MRSA, pyomyositis s/p right flank drain, left sided empyema s/p thoracentesis/VATS, epidural abscess being conservatively managed, worsening nonoliguric AKI on CKD4 with solitary left kidney, afib/flutter, acute hypoxic respiratory failure, acute blood loss anemia with underlying paranoid schizophrenia/chronic anxiety. Urine culture with klebsiella pneumonia. Blood cultures have grown out MRSA. F/u blood cultures 05/27/2019 show no growth. Patient will require 6 weeks of IV Teflaro and ID follow-up. Patient with worsening kidney function and worsening urine output. Per nephrology, patient is a poor dialysis candidate given hospital admissions and disseminated MRSA. Palliative medicine consultation for goals of care.   Clinical Assessment and Goals of Care:  I have reviewed medical records, discussed with care team, and met with [facility] at bedside to discuss goals of care. Patient is drowsy and difficult to engage in discussion. He is delayed in responses but will answer questions with simple responses. He denies pain or discomfort. He appears mildly  short of breath at rest on 2L [place].   I introduced Palliative Medicine as specialized medical care for people living with serious illness. It focuses on providing relief from the symptoms and stress of a serious illness. The goal is to improve quality of life for both the patient and the family.  Patient shares that he lives in a group home. He is unsure who [name] is on his emergency contact list. He shares that he has 5 children but does not talk to them. He confirms three sisters on emergency contact list.    Attempted to discuss course of hospitalization including diagnoses, interventions and plan of care. Attempted to discuss his thoughts on dialysis. He states "if it would help, try." Also attempted to discuss code status. Again, patient is drowsy and difficult to engage in discussion. I do not believe he has understanding or capacity to make medical decisions.   Today attempted x4 to contact [facility]. Legal guardian, [name] is out of office until 06/13/19. Attempted main DSS number. No operator and unable to leave secure voicemail. Will continue attempts.    Discussed with Dr. Benny Lennert and Dr. Johnney Ou via secure epic chat.   SUMMARY OF RECOMMENDATIONS    Continue FULL code/FULL scope treatment  Documented legal guardianship through [facility].  PMT provider will continue attempts to reach [place] legal guardian.   Code Status/Advance Care Planning:  Full  code  Symptom Management:   Per attending  Palliative Prophylaxis:   Aspiration, Bowel Regimen, Delirium Protocol, Frequent Pain Assessment, Oral Care and Turn Reposition  Additional Recommendations (Limitations, Scope, Preferences):  Full Scope Treatment  Psycho-social/Spiritual:   Desire for further Chaplaincy support: yes  Additional Recommendations: Caregiving  Support/Resources, Compassionate Wean Education and Education on Hospice  Prognosis:   Poor prognosis  Discharge Planning: To Be  Determined      Primary Diagnoses: Present on Admission: . Severe sepsis ([place]) . MRSA bacteremia . Chronic kidney disease, stage IV (severe) ([place]) . COPD (chronic obstructive pulmonary disease) ([place]) . Pyohydronephrosis . Schizophrenia ([place]) . Septic embolism ([place]) . Epidural abscess . ETOH abuse . Atrial flutter ([place]) . Hypothyroidism   I have reviewed the medical record, interviewed the patient and family, and examined the patient. The following aspects are pertinent.  Past Medical History:  Diagnosis Date  . Asthma   . COPD (chronic obstructive pulmonary disease) ([place])   . Depression   . External hemorrhoids   . Gastric ulcer   . GERD (gastroesophageal reflux disease)   . Hydronephrosis   . Hypothyroidism    hypo  . Insomnia   . Mental disorder   . Myocardial infarction ([place])   . Paranoid schizophrenia ([place])   . Renal disorder    renal failure  . Seizures ([place])   . Stroke [facility])    Social History   Socioeconomic History  . Marital status: Single    Spouse name: Not on file  . Number of children: Not on file  . Years of education: Not on file  . Highest education level: Not on file  Occupational History  . Not on file  Tobacco Use  . Smoking status: Current Every Day Smoker    Packs/day: 1.00    Years: 55.00    Pack years: 55.00    Types: Cigarettes  . Smokeless tobacco: Former Systems developer    Types: Chew  Substance and Sexual Activity  . Alcohol use: No  . Drug use: No  . Sexual activity: Not Currently  Other Topics Concern  . Not on file  Social History Narrative   Lives in [place], legal guardian is a SW   Social Determinants of Health   Financial Resource Strain:   . Difficulty of Paying Living Expenses: Not on file  Food Insecurity:   . Worried About Charity fundraiser in the Last Year: Not on file  . Ran Out of Food in the Last Year: Not on file  Transportation Needs:   . Lack of Transportation (Medical): Not on file  . Lack of  Transportation (Non-Medical): Not on file  Physical Activity:   . Days of Exercise per Week: Not on file  . Minutes of Exercise per Session: Not on file  Stress:   . Feeling of Stress : Not on file  Social Connections:   . Frequency of Communication with Friends and Family: Not on file  . Frequency of Social Gatherings with Friends and Family: Not on file  . Attends Religious Services: Not on file  . Active Member of Clubs or Organizations: Not on file  . Attends Archivist Meetings: Not on file  . Marital Status: Not on file   Family History  Problem Relation Age of Onset  . Stroke Father   . Pneumonia Father   . Brain cancer Mother    Scheduled Meds: . bisacodyl  10 mg Oral Daily  . Chlorhexidine Gluconate Cloth  6 each Topical Daily  . feeding supplement (NEPRO CARB STEADY)  237 mL Oral BID BM  . feeding supplement (PRO-STAT SUGAR FREE 64)  30 mL Oral BID  . ferrous sulfate  325 mg Oral Q breakfast  . heparin injection (subcutaneous)  5,000 Units Subcutaneous Q8H  . insulin aspart  0-24 Units Subcutaneous TID AC & HS  . ipratropium-albuterol  3 mL Nebulization BID  . levothyroxine  50 mcg Oral QAC breakfast  . metoprolol tartrate  25 mg Oral BID  . multivitamin with minerals  1 tablet Oral Daily  . nicotine  21 mg Transdermal Daily  . pantoprazole (PROTONIX) IV  40 mg Intravenous Q12H  . senna-docusate  1 tablet Oral QHS  . sodium bicarbonate  1,300 mg Oral TID  . sodium zirconium cyclosilicate  10 g Oral BID   Continuous Infusions: . sodium chloride    . ceftaroline (TEFLARO) 400 mg IVPB 300 mg (06/06/19 7062)  . meropenem (MERREM) IV 1 g (06/06/19 0908)   PRN Meds:.Place/Maintain arterial line **AND** sodium chloride, acetaminophen **OR** acetaminophen, fentaNYL (SUBLIMAZE) injection, ipratropium-albuterol, LORazepam, ondansetron (ZOFRAN) IV, oxyCODONE-acetaminophen, polyethylene glycol Medications Prior to Admission:  Prior to Admission medications     Medication Sig Start Date End Date Taking? Authorizing Provider  ceftaroline 400 mg in sodium chloride 0.9 % 250 mL Inject 400 mg into the vein every 12 (twelve) hours. 05/15/2019   Lavina Hamman, MD  feeding supplement, ENSURE ENLIVE, (ENSURE ENLIVE) LIQD Take 237 mLs by mouth 3 (three) times daily between meals. 05/16/2019   Lavina Hamman, MD  ipratropium-albuterol (DUONEB) 0.5-2.5 (3) MG/3ML SOLN Take 3 mLs by nebulization 3 (three) times daily. 05/08/2019   Lavina Hamman, MD  levothyroxine (SYNTHROID, LEVOTHROID) 50 MCG tablet Take 50 mcg by mouth daily before breakfast.    [provider]  linezolid (ZYVOX) 600 MG/300ML IVPB Inject 300 mLs (600 mg total) into the vein every 12 (twelve) hours. 05/07/2019   Lavina Hamman, MD  LORazepam (ATIVAN) 2 MG/ML injection Inject 0.25 mLs (0.5 mg total) into the vein every 6 (six) hours as needed for anxiety. 05/07/2019   Lavina Hamman, MD  metoprolol tartrate (LOPRESSOR) 25 MG tablet Take 1 tablet (25 mg total) by mouth 2 (two) times daily. 05/03/2019   Lavina Hamman, MD  Multiple Vitamin (MULTIVITAMIN) LIQD Take 15 mLs by mouth daily. 05/23/2019   Lavina Hamman, MD  nicotine (NICODERM CQ - DOSED IN MG/24 HOURS) 21 mg/24hr patch Place 1 patch (21 mg total) onto the skin daily. 05/19/2019   Lavina Hamman, MD  oxyCODONE-acetaminophen (PERCOCET/ROXICET) 5-325 MG tablet Take 1 tablet by mouth every 4 (four) hours as needed for moderate pain or severe pain. 05/15/2019   Lavina Hamman, MD   Allergies  Allergen Reactions  . Mellaril [Thioridazine] Shortness Of Breath and Swelling  . Buprenorphine Itching  . Morphine And Related Itching  . Navane [Thiothixene] Other (See Comments)    Reaction:  GI upset   . Thorazine [Chlorpromazine] Other (See Comments)    Reaction:  Dizziness/fainting    Review of Systems  Unable to perform ROS: Mental status change    Physical Exam Vitals and nursing note reviewed.  Constitutional:      Appearance: He is  ill-appearing.  HENT:     Head: Normocephalic and atraumatic.  Cardiovascular:     Rate and Rhythm: Normal rate.  Pulmonary:     Effort: No tachypnea, accessory muscle usage or respiratory distress.  Comments: Intermittent dyspnea at rest. 2L [place] Abdominal:     Tenderness: There is no abdominal tenderness.  Skin:    General: Skin is warm and dry.     Comments: anasarca  Neurological:     Mental Status: He is easily aroused.     Comments: Oriented to person/place, drowsy and difficult to discuss GOC. Does not seem to grasp medical complexity of current conditions.   Psychiatric:        Attention and Perception: He is inattentive.        Speech: Speech is delayed.    Vital Signs: BP (!) 114/53 (BP Location: Right Arm)   Pulse 79   Temp 97.6 F (36.4 C) (Oral)   Resp 20   Ht 6' (1.829 m)   Wt 107.8 kg   SpO2 99%   BMI 32.23 kg/m  Pain Scale: 0-10 POSS *See Group Information*: 1-Acceptable,Awake and alert Pain Score: Asleep   SpO2: SpO2: 99 % O2 Device:SpO2: 99 % O2 Flow Rate: .O2 Flow Rate (L/min): 2 L/min  IO: Intake/output summary:   Intake/Output Summary (Last 24 hours) at 06/06/2019 1214 Last data filed at 06/06/2019 0900 Gross per 24 hour  Intake 450 ml  Output 180 ml  Net 270 ml    LBM: Last BM Date: 06/06/19 Baseline Weight: Weight: 90.1 kg Most recent weight: Weight: 107.8 kg     Palliative Assessment/Data: PPS 30%     Time In/Out: 9672-8979  Time Total: 70 Greater than 50%  of this time was spent counseling and coordinating care related to the above assessment and plan.  Signed by:  Ihor Dow, DNP, FNP-C Palliative Medicine Team  Phone: [phone] Fax: [phone]   Please contact Palliative Medicine Team phone at [phone] for questions and concerns.  For individual provider: See Shea Evans

## 2019-06-06 NOTE — Progress Notes (Signed)
PT Cancellation Note  Patient Details Name: Alan Small MRN: [id number] DOB: [birth year]   Cancelled Treatment:    Reason Eval/Treat Not Completed: Other (comment) Pt having frequent runny stools with any movement per nurse tech.  Reports they are trying to get flexi-seal order.  Tech in room preparing to clean pt.  Will f/u as able. Maggie Font, PT Acute Rehab Services Pager [phone] [facility] [phone] [facility] [place] 06/06/2019, 4:21 PM

## 2019-06-06 NOTE — Progress Notes (Signed)
PMT consult received and chart reviewed in detail. Attempted discussion with patient at bedside. Alan Small is oriented to person/place but remains drowsy (likely uremia with worsening kidney function) throughout discussion and I question his ability to understand or make medical decisions for himself. In regards to starting dialysis, patient states "if it would help, try" and speaks of desire for FULL code/FULL scope treatment.   Patient has a documented legal guardian through [place]. Attempted to reach Alan Small, documented guardian. Alan Small is out of office until 04/12/20. Attempted main Alan Small DSS contact number. There is no answer from operator and unable to leave secure voicemail. PMT provider will continue attempts to reach the state to discuss [place] for [place].  Updated Dr. Benny Lennert and Dr. Johnney Ou. PMT will follow.  Full note to follow.  NO CHARGE  Ihor Dow, [place], FNP-C Palliative Medicine Team  Phone: [phone] Fax: [phone]

## 2019-06-06 NOTE — Progress Notes (Signed)
PROGRESS NOTE  TEHRAN RABENOLD GQQ:[id number] DOB: 1956-01-19 DOA: 05/13/2019 PCP: [name], MD  Brief History   [name] a 64 y.o.malewith medical history significant ofsolitary left kidney, CKD stage IV, left staghorn calculus SP stenting, urethral stricture SP urethroplasty, schizophrenia, clinically diagnosed COPD, depression.  Patient was admitted at [facility] between February 10, 2019 and March 02, 2019. Patient was treated for pyelonephritis and retroperitoneal fluid collection concerning for abscess. Urine culture grew MDR E. coli. Patient underwent aspiration of the abscess on 9/17 with JP drain placement. Patient was started on IV imipenem with a PICC line. Summary ofhisactive problems in the hospital is as following.Underwent left percutaneous nephrolithotomy 03/28/2019 and ureteral stent placement at the same time. Underwent laser lithotripsy and basket extraction on 04/05/2019. Patient was recommended to continue the antibiotics until April 16, 2019.  Patient presented to [facility] on 05/11/2019 with shortness of breath left leg pain and fever and nausea and vomiting. CT scan showed evidence of stone on the left side. Patient was initially placed on the BiPAP. His oxygenation worsened and patient actually required intubation in the emergency department. Urology was consulted and patient underwent urgent cystoscopy and left ureteral stent placement. Patient was started on broad-spectrum antibiotics and his blood cultures came back positive for MRSA. Patient was extubated on 05/20/2019 and since then has remained on nasal cannula. Patient's PICC line was removed on 05/12/2019 and a central line was placed which was removed on 05/18/2019. Foley catheter was inserted on 05/11/2019 after the urology procedure and removed on 05/18/2019. TEE performed on that day as well demonstrated an EF of 67-12% normal systolic function of the right ventricle. There were no  regional wall motion abnormalities and diastolic dysfunction could not be evaluated. It showed no evidence of endocarditis but the patient does have evidence of septic emboli with cavitary lesions on bilateral lung parenchyma therefore patient is treated as infective endocarditis. On 05/19/2019 left thoracentesis/pleural fluid culture came back positive for MRSA. Report showed loculated effusion post procedure. 05/20/2019 had pyomyositis underwent right-sided drain placement after incision and debridement. 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 [facility] neurosurgery was consulted who recommended conservative management. Dr. [name] was on call. Due to worsening shortness of breath CT chest performed on 05/22/2019 showed evidence of recollection of pleural fluid. Chest tube was inserted by PCCM on 05/22/2019. VATS was performed on 12/23. This was well tolerated well. The patient is on Ceftaroline now. Merrem added to cover klebsiella grown from urine culture. Clinically improving.  Hemoglobin 12/29 8.5 from 6.7 post 1 unit PRBC transfusion 12/28.    Consultants  . Nephrology . Infectious Disease . General Surgery . Urology  Antibiotics   Anti-infectives (From admission, onward)   Start     Dose/Rate Route Frequency Ordered Stop   06/05/19 1245  meropenem (MERREM) 1 g in sodium chloride 0.9 % 100 mL IVPB     1 g 200 mL/hr over 30 Minutes Intravenous Every 12 hours 06/05/19 1237     06/01/19 1400  ceftaroline (TEFLARO) 300 mg in sodium chloride 0.9 % 250 mL IVPB     300 mg 125 mL/hr over 120 Minutes Intravenous Every 8 hours 06/01/19 0818     05/28/19 1400  ceftaroline (TEFLARO) 400 mg in sodium chloride 0.9 % 250 mL IVPB  Status:  Discontinued     400 mg 125 mL/hr over  120 Minutes Intravenous Every 8 hours 05/28/19 1009 06/01/19 0819   05/26/19 1400  ceftaroline  (TEFLARO) 300 mg in sodium chloride 0.9 % 250 mL IVPB  Status:  Discontinued     300 mg 125 mL/hr over 120 Minutes Intravenous Every 8 hours 05/26/19 0731 05/28/19 1009   05/13/2019 2200  ceftaroline (TEFLARO) 400 mg in sodium chloride 0.9 % 250 mL IVPB  Status:  Discontinued     400 mg 250 mL/hr over 60 Minutes Intravenous Every 8 hours 05/22/2019 1050 05/26/19 0731   06/02/2019 1342  ceFAZolin (ANCEF) IVPB 2g/100 mL premix     2 g 200 mL/hr over 30 Minutes Intravenous 30 min pre-op 05/11/2019 1342 05/22/2019 1454   05/04/2019 2245  ceftaroline (TEFLARO) 600 mg in sodium chloride 0.9 % 250 mL IVPB  Status:  Discontinued     600 mg 250 mL/hr over 60 Minutes Intravenous Every 12 hours 05/26/2019 2231 05/27/2019 2239   05/27/2019 2245  linezolid (ZYVOX) IVPB 600 mg  Status:  Discontinued     600 mg 300 mL/hr over 60 Minutes Intravenous Every 12 hours 06/02/2019 2231 05/12/2019 1050   05/04/2019 2245  ceftaroline (TEFLARO) 400 mg in sodium chloride 0.9 % 250 mL IVPB  Status:  Discontinued     400 mg 250 mL/hr over 60 Minutes Intravenous Every 12 hours 05/03/2019 2239 05/15/2019 1050     Subjective  The patient is resting comfortably in bed. No new complaints.   Objective   Vitals:  Vitals:   06/06/19 0902 06/06/19 1200  BP: (!) 114/53 (!) 96/59  Pulse: 79 66  Resp:  18  Temp:  (!) 97.5 F (36.4 C)  SpO2:  100%    Exam:  Constitutional:  The patient is awake, alert, and oriented x 3. No acute distress. Respiratory:  No increased work of breathing. No wheezes, rales, or rhonchi No tactile fremitus Cardiovascular:  Regular rate and rhythm No murmurs, ectopy, or gallups. No lateral PMI. No thrills. Abdomen:  Abdomen is soft, non-tender, non-distended No hernias, masses, or organomegaly Normoactive bowel sounds.  Musculoskeletal:  No cyanosis, clubbing, or edema Skin:  No rashes, lesions, ulcers palpation of skin: no induration or nodules Neurologic:  CN 2-12 intact Sensation all 4 extremities  intact Psychiatric:  Mental status Mood, affect appropriate Orientation to person, place, time  judgment and insight appear intact  I have personally reviewed the following:   Today's Data  . Vitals, BMP, CBC  Micro Data  . Urine culture has grown out more than 100K of klebsiella pneumoniae. It has been shown to be sensitive to imipenem.  Scheduled Meds: . bisacodyl  10 mg Oral Daily  . Chlorhexidine Gluconate Cloth  6 each Topical Daily  . feeding supplement (NEPRO CARB STEADY)  237 mL Oral BID BM  . feeding supplement (PRO-STAT SUGAR FREE 64)  30 mL Oral BID  . ferrous sulfate  325 mg Oral Q breakfast  . furosemide  80 mg Intravenous Once  . heparin injection (subcutaneous)  5,000 Units Subcutaneous Q8H  . insulin aspart  0-24 Units Subcutaneous TID AC & HS  . ipratropium-albuterol  3 mL Nebulization BID  . levothyroxine  50 mcg Oral QAC breakfast  . metoprolol tartrate  25 mg Oral BID  . multivitamin with minerals  1 tablet Oral Daily  . nicotine  21 mg Transdermal Daily  . pantoprazole (PROTONIX) IV  40 mg Intravenous Q12H  . senna-docusate  1 tablet Oral QHS  . sodium bicarbonate  1,300  mg Oral TID  . sodium zirconium cyclosilicate  10 g Oral BID   Continuous Infusions: . sodium chloride    . ceftaroline (TEFLARO) 400 mg IVPB 300 mg (06/06/19 9480)  . meropenem (MERREM) IV 1 g (06/06/19 0908)    Principal Problem:   Empyema of pleural space (HCC) Active Problems:   Schizophrenia (HCC)   Hypothyroidism   Severe sepsis (HCC)   Pyohydronephrosis   Kidney stone   Chronic kidney disease, stage IV (severe) (HCC)   COPD (chronic obstructive pulmonary disease) (HCC)   Atrial flutter (HCC)   MRSA bacteremia   Septic embolism (HCC)   Epidural abscess   ETOH abuse   LOS: 13 days   A & P  Severe sepsis (POA)with septic shock secondary to disseminated MRSA infection thought to have stemmed from a tunneled PICC line that was not maintained or removed following  8-week course of IV antibiotics: Resolved sepsis. Pt also has pyomyositis (POA) for which he has had a right flank drain placed. He also has a left sided empyema s/p thoracentesis/VATS (POA). Chest tube has been placed and now removed. CTS has signed off. There is also an epidural abscess that is being addressed conservatively as recommended by neurosurgery. Urine culture collected on 06/02/2018 has grown out klebsiella pneumoniae. Pt is still receiving Teflaro. Sensitivities are pending. Urology has also signed off. Blood cultures drawn on admission have grown out MRSA. Follow up blood cultures drawn on 05/13/2019 have had no growth. He will require 6 weeks of IV Teflaro. He will need to follow up with Dr. Delaine Lame as outpatient. Palliative care has been consulted.  UTI: Urine culture has grown out more than 100 thousand CFU of klebsiella pneumoniae. The organism has been shown to be sensitive to imipanem. He has been started on meropenem.  Anasarca: Diffuse edema with worsening renal function. Holding off IV fluids. Nephrology consulted and following.  Worsening nonoliguric AKI on CKD 4 in the setting of solitary left kidney: Nephrology consulted. Pt is a poor candidate for HD. Palliative care has been consulted. Avoid nephrotoxins and hypotension. Renal ultrasound done on 05/23/2019 showed mild left hydronephrosis/pelviectasis, post right nephrectomy, mild bladder wall thickening. Baseline creatinine appears to be 2.3 with GFR in the 28. Creatinine worsening daily. Creatinine today is 6.26. Rate of increase does seem to be declining, however. Continue to monitor electrolytes, creatinine, and volume status.  Non anion gap metabolic acidosis likely in the setting of worsening renal function: Improving on p.o. sodium bicarb 1300 mg twice daily. Managed by nephrology. Monitor.  Refractory hyperkalemia in the setting of worsening renal function: Potassium 5.4>> 5.4.  Continue Lokelma 10 g daily as  recommended by nephrology.  Obstructing/infected renal calculus, Concurrent VRE UTI, likely POA, S/P emergency ureteral stent placement: Cystoscopy on 05/11/2019 and also was found to have a bulbar urethral stricture. The stricture was dilated. The stent was placed in the left ureter. The urine culture on 05/11/2019 was negative. A repeat urine culture was sent on 05/16/2019 and  positive for VRE. On ceftaroline per ID. Urine culture collected on 06/02/2018 has grown out more than 100 thousand CFU of klebsiella pneumoniae which is sensitive to imipenem. The patient is on Teflaro and cefepime.   A. fib/flutter with rapid ventricular rate, resolved: Rate is currently controlled on p.o. Lopressor 25 mg twice daily. Anticoagulation not recommended due to history of fall and anemia. Echo with normal LV function. Continue to monitor on telemetry.  Acute hypoxic respiratory failure likely in the setting of  left empyema and pulmonary edema: Not on oxygen supplementation at baseline. Currently requiring at least 2 L to maintain O2 saturation greater than 92%. Continue pulmonary toilet, incentive spirometer, flutter valve, and IV antibiotics.  Acute blood loss anemia/iron deficiency anemia/Anemia of chronic disease 2/2 CKD: The patient has received 1 unit PRBC. Monitor hemoglobin. Multifactorial in the setting of worsening renal function, internal hemorrhoids with positive FOBT, iron deficiency. He has received 1 dose of Feraheme 520 mg once on 06/02/2019. He will be continued on daily ferrous sulfate. Transfuse for hemoglobin less than 7.0. hemoglobin is 8.0 today.  Acute transaminitis: Acute hepatitis panel negative. CT abdomen and pelvis done on 05/18/2019 without contrast: Multiple low-density lesions again seen within the liver, unchanged, likely cysts. Large exophytic cyst off the left. The hepatic lobe is stable. Gallbladder unremarkable. Trend LFTs and continue to avoid hepatotoxins.  Alcohol withdrawal  syndrome, resolved: Out of alcohol withdrawal window.  No sign of alcohol withdrawal.  Paranoid schizophrenia/chronic anxiety: Stable.  Severe physical debility: PT OT assessed and recommended SNF. CSW assisting with placement. Continue PT OT with assistance and fall precautions. Out of bed to chair as tolerated.  I have seen and examined this patient myself. I have spent 32 minutes in his evaluation and care.  DVT prophylaxis:Heparin sq TID Code Status:Full Family Communication:None at bedside Disposition Plan: Will plan to DC once CTS and general surgery sign off and patient is hemodynamically stable.  Jordy Verba, DO [facility] Direct contact: see [email or web address]  7PM-7AM contact night coverage as above 06/06/2019, 4:09 PM  LOS: 11 days

## 2019-06-06 NOTE — Progress Notes (Signed)
Pt had 150 cc urine output for 12 hr shift, urine was very cloudy with sediments, amber  Color with strong odor. Bladder scan showed 14 cc. Pt's edema is not getting better, Bilateral arms, legs and scrotum elevated. Left arm is slowly staring to weep. Call bell within reach. Will continue to monitor.

## 2019-06-06 NOTE — Final Consult Note (Signed)
Consultant Final Sign-Off Note    Assessment/Final recommendations  Alan Small is a 64 y.o. male followed by me for right flankmyositis with abscess that is status post incision and drainage 12/18 Dr. Christian Small at [facility]. We were asked to follow for staple removal and possible drain removal if/when timing was appropriate.  - cx with MRSA, abx per primary  - Staple removed - Maintain drain. Follow up with Surgeon at [facility] (if applicable): Drain sponge around drain   Diet at discharge: per primary team   Activity at discharge: Do not lift anything greater than 10lbs for the next 4 weeks    Follow-up appointment:  Dr. Christian Small   Pending results:  Unresulted Labs (From admission, onward)    Start     Ordered   06/01/19 1531  Urinalysis, Routine w reflex microscopic  Once,   R     06/01/19 1530           Medication recommendations: Abx per primary    Other recommendations:    Thank you for allowing [place] to participate in the care of your patient!  Please consult [place] again if you have further needs for your patient.  Alan Small [facility] 06/06/2019 9:31 AM    Subjective   No abdominal pain. Currently NPO and asking when he can eat. No fevers, n/v.   Objective  Vital signs in last 24 hours: Temp:  [97.6 F (36.4 C)-97.9 F (36.6 C)] 97.6 F (36.4 C) (01/04 0437) Pulse Rate:  [70-82] 79 (01/04 0902) Resp:  [11-20] 20 (01/04 0437) BP: (97-114)/(46-62) 114/53 (01/04 0902) SpO2:  [97 %-100 %] 99 % (01/04 0830) Weight:  [107.8 kg] 107.8 kg (01/04 0437)  Abd: Soft, NT, +BS. Right flank staples removed. JP drain in place with suture in place. JP with SS like output.    Pertinent labs and Studies: Recent Labs    06/04/19 0635 06/05/19 0529 06/06/19 0516  WBC 11.3* 9.6 9.9  HGB 8.4* 7.2* 8.0*  HCT 26.9* 23.0* 26.4*   BMET Recent Labs    06/05/19 0529 06/06/19 0516  NA 132* 135  K 5.4* 5.4*  CL 104 107  CO2 18* 18*  GLUCOSE 97 87  BUN 102* 112*   CREATININE 5.59* 6.26*  CALCIUM 7.4* 7.3*   No results for input(s): LABURIN in the last 72 hours. Results for orders placed or performed during the hospital encounter of 05/14/2019  Aerobic/Anaerobic Culture (surgical/deep wound)     Status: None   Collection Time: 06/01/2019  3:41 PM   Specimen: PATH Cytology Pleural fluid; Body Fluid  Result Value Ref Range Status   Specimen Description FLUID LEFT PLEURAL  Final   Special Requests PT ON ANCEF  Final   Gram Stain NO WBC SEEN NO ORGANISMS SEEN   Final   Culture   Final    No growth aerobically or anaerobically. Performed at [facility], [place] [address], [place], [place] [address]    Report Status 05/30/2019 FINAL  Final  Aerobic/Anaerobic Culture (surgical/deep wound)     Status: None   Collection Time: 05/07/2019  3:49 PM   Specimen: PATH Cytology Pleural fluid; Body Fluid  Result Value Ref Range Status   Specimen Description FLUID PLEURAL LEFT  Final   Special Requests NO 2 PT ON ANCEF  Final   Gram Stain   Final    RARE WBC PRESENT,BOTH PMN AND MONONUCLEAR NO ORGANISMS SEEN    Culture   Final  No growth aerobically or anaerobically. Performed at [facility], [place] [address], [place], [place] [address]    Report Status 05/30/2019 FINAL  Final  Culture, Urine     Status: Abnormal   Collection Time: 06/03/19  9:04 AM   Specimen: Urine, Random  Result Value Ref Range Status   Specimen Description URINE, RANDOM  Final   Special Requests NONE  Final   Culture (A)  Final    >=100,000 COLONIES/mL KLEBSIELLA PNEUMONIAE >=100,000 COLONIES/mL VANCOMYCIN RESISTANT ENTEROCOCCUS ISOLATED Confirmed Extended Spectrum Beta-Lactamase Producer (ESBL).  In bloodstream infections from ESBL organisms, carbapenems are preferred over piperacillin/tazobactam. They are shown to have a lower risk of mortality. FOR KLEBSIELLA PNEUMONIAE Performed at [facility], [place] [address], [place], [place] [address]    Report Status  06/06/2019 FINAL  Final   Organism ID, Bacteria KLEBSIELLA PNEUMONIAE (A)  Final   Organism ID, Bacteria VANCOMYCIN RESISTANT ENTEROCOCCUS ISOLATED (A)  Final      Susceptibility   Klebsiella pneumoniae - MIC*    AMPICILLIN >=32 RESISTANT Resistant     CEFAZOLIN >=64 RESISTANT Resistant     CEFTRIAXONE >=64 RESISTANT Resistant     CIPROFLOXACIN 2 INTERMEDIATE Intermediate     GENTAMICIN <=1 SENSITIVE Sensitive     IMIPENEM <=0.25 SENSITIVE Sensitive     NITROFURANTOIN 64 INTERMEDIATE Intermediate     TRIMETH/SULFA >=320 RESISTANT Resistant     AMPICILLIN/SULBACTAM >=32 RESISTANT Resistant     PIP/TAZO 16 SENSITIVE Sensitive     * >=100,000 COLONIES/mL KLEBSIELLA PNEUMONIAE   Vancomycin resistant enterococcus isolated - MIC*    AMPICILLIN 8 SENSITIVE Sensitive     NITROFURANTOIN 256 RESISTANT Resistant     VANCOMYCIN >=32 RESISTANT Resistant     LINEZOLID 2 SENSITIVE Sensitive     * >=100,000 COLONIES/mL VANCOMYCIN RESISTANT ENTEROCOCCUS ISOLATED  Expectorated sputum assessment w rflx to resp cult     Status: None   Collection Time: 06/03/19  2:36 PM   Specimen: Sputum  Result Value Ref Range Status   Specimen Description SPUTUM  Final   Special Requests Normal  Final   Sputum evaluation   Final    THIS SPECIMEN IS ACCEPTABLE FOR SPUTUM CULTURE Performed at [facility], [address], [place],  [address]    Report Status 06/03/2019 FINAL  Final  Culture, respiratory     Status: None (Preliminary result)   Collection Time: 06/03/19  2:36 PM   Specimen: SPU  Result Value Ref Range Status   Specimen Description SPUTUM  Final   Special Requests Normal Reflexed from [id number]  Final   Gram Stain   Final    RARE WBC PRESENT, PREDOMINANTLY PMN FEW SQUAMOUS EPITHELIAL CELLS PRESENT ABUNDANT GRAM POSITIVE COCCI ABUNDANT GRAM NEGATIVE RODS ABUNDANT GRAM POSITIVE RODS FEW YEAST    Culture   Final    CULTURE REINCUBATED FOR BETTER GROWTH Performed at [facility], [place] [address], [place],  [address]    Report Status PENDING  Incomplete    Imaging: No results found.

## 2019-06-06 NOTE — Progress Notes (Signed)
[facility] Progress Note    Assessment/ Plan:   1. Nonoliguric AoCKD4: labile but baseline recently around 2.5.  Likley multifactorial.  Obstruction ruled out with renal US but stent not able to be visualized.  CT abd/ pelvis with stent in correct place, no hydro.  Complements haven't resulted yet- resending. 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  AIN possible but unclear if switching from ceftaroline to another agent would be advisable- would recommend discussing with ID.  S/p one dose of IV Lasix for SOB yesterday 06/04/2019 with improvement  He is a poor dialysis candidate given mult recent admissions, disseminated MRSA,  SNF status prior to admission and I would not pursue this therapy.  Pall care c/s, appreciate assistance.  Will give lasix today to help temporize.  Palliative care trying to reach medical decision maker (state).  2. Solitary L kidney, remote nephrectomy 3. Hyperkalemia, mild--> stable on lokelma 10 mg BID 4. Mild metabolic acidosis, on bicarb ^ to TID today. 5. Nephrolithiasis, current L ureteral stent; hx/o infected stones 6. Disseminated MRSA: empyema (s/p VATS--> decortication and wedge resection), septic emboly, epidural abscess, pyomyositis s/p I&D. On Ceftaroline.  7. Schizophrenia 8. COPD 9. UTI- Klebsiella + VRE, reviewed sensitivities  Subjective:    UOP only 177mL yesterday. Bladder scan no retention.  Surgery signed off today for outpt f/u.   BUN/Cr continue to rise. He denies complaints    Objective:   BP (!) 114/53 (BP Location: Right Arm)   Pulse 79   Temp 97.6 F (36.4 C) (Oral)   Resp 20   Ht 6' (1.829 m)   Wt 107.8 kg   SpO2 99%   BMI 32.23 kg/m   Intake/Output Summary (Last 24 hours) at 06/06/2019 1031 Last data filed at 06/06/2019 0900 Gross per 24 hour  Intake 450 ml  Output 180 ml  Net 270 ml   Weight change: 0.1 kg  Physical Exam: Gen: sitting in bed NAD CVS: RRR no m/r/g Resp: on O2, Abd: nontender Ext:2+  anasarca  Imaging: No results found.  Labs: BMET Recent Labs  Lab 06/01/19 0313 06/01/19 1328 06/01/19 1510 06/02/19 0251 06/03/19 0659 06/04/19 0635 06/05/19 0529 06/06/19 0516  NA 135 134*  --  135 137 136 132* 135  K 5.4* 6.3* 5.3* 5.2* 5.4* 5.4* 5.4* 5.4*  CL 107 108  --  106 110 106 104 107  CO2 19* 18*  --  20* 20* 18* 18* 18*  GLUCOSE 87 118*  --  90 100* 84 97 87  BUN 54* 60*  --  65* 80* 90* 102* 112*  CREATININE 3.26* 3.29*  --  3.83* 4.54* 5.00* 5.59* 6.26*  CALCIUM 8.1* 8.0*  --  8.0* 7.6* 7.4* 7.4* 7.3*   CBC Recent Labs  Lab 06/03/19 0659 06/04/19 0635 06/05/19 0529 06/06/19 0516  WBC 9.3 11.3* 9.6 9.9  NEUTROABS 6.0 9.7* 6.3 7.9*  HGB 7.1* 8.4* 7.2* 8.0*  HCT 23.6* 26.9* 23.0* 26.4*  MCV 95.9 93.1 92.4 95.0  PLT 209 250 222 220    Medications:    . bisacodyl  10 mg Oral Daily  . Chlorhexidine Gluconate Cloth  6 each Topical Daily  . feeding supplement (NEPRO CARB STEADY)  237 mL Oral BID BM  . feeding supplement (PRO-STAT SUGAR FREE 64)  30 mL Oral BID  . ferrous sulfate  325 mg Oral Q breakfast  . heparin injection (subcutaneous)  5,000 Units Subcutaneous Q8H  . insulin aspart  0-24 Units Subcutaneous TID AC &  HS  . ipratropium-albuterol  3 mL Nebulization BID  . levothyroxine  50 mcg Oral QAC breakfast  . metoprolol tartrate  25 mg Oral BID  . multivitamin with minerals  1 tablet Oral Daily  . nicotine  21 mg Transdermal Daily  . pantoprazole (PROTONIX) IV  40 mg Intravenous Q12H  . senna-docusate  1 tablet Oral QHS  . sodium bicarbonate  1,300 mg Oral BID  . sodium zirconium cyclosilicate  10 g Oral BID

## 2019-06-07 LAB — RENAL FUNCTION PANEL
Albumin: <1 g/dL — ABNORMAL LOW (ref 3.5–5.0)
Anion gap: 14 (ref 5–15)
BUN: 118 mg/dL — ABNORMAL HIGH (ref 8–23)
CO2: 16 mmol/L — ABNORMAL LOW (ref 22–32)
Calcium: 7.4 mg/dL — ABNORMAL LOW (ref 8.9–10.3)
Chloride: 106 mmol/L (ref 98–111)
Creatinine, Ser: 6.67 mg/dL — ABNORMAL HIGH (ref 0.61–1.24)
GFR calc Af Amer: 9 mL/min — ABNORMAL LOW (ref >60)
GFR calc non Af Amer: 8 mL/min — ABNORMAL LOW (ref >60)
Glucose, Bld: 80 mg/dL (ref 70–99)
Phosphorus: 9.1 mg/dL — ABNORMAL HIGH (ref 2.5–4.6)
Potassium: 5.2 mmol/L — ABNORMAL HIGH (ref 3.5–5.1)
Sodium: 136 mmol/L (ref 135–145)

## 2019-06-07 LAB — GLUCOSE, CAPILLARY
Glucose-Capillary: 73 mg/dL (ref 70–99)
Glucose-Capillary: 81 mg/dL (ref 70–99)
Glucose-Capillary: 88 mg/dL (ref 70–99)
Glucose-Capillary: 85 mg/dL (ref 70–99)

## 2019-06-07 LAB — CBC
HCT: 27.5 % — ABNORMAL LOW (ref 39.0–52.0)
Hemoglobin: 8.2 g/dL — ABNORMAL LOW (ref 13.0–17.0)
MCH: 28.9 pg (ref 26.0–34.0)
MCHC: 29.8 g/dL — ABNORMAL LOW (ref 30.0–36.0)
MCV: 96.8 fL (ref 80.0–100.0)
Platelets: 224 10*3/uL (ref 150–400)
RBC: 2.84 MIL/uL — ABNORMAL LOW (ref 4.22–5.81)
RDW: 15.8 % — ABNORMAL HIGH (ref 11.5–15.5)
WBC: 10.1 10*3/uL (ref 4.0–10.5)
nRBC: 0 % (ref 0.0–0.2)

## 2019-06-07 MED ORDER — DEXTROSE 5 % IV SOLN: INTRAVENOUS | Status: AC

## 2019-06-07 MED ORDER — DEXTROSE 10 % IV SOLN: INTRAVENOUS | Status: DC

## 2019-06-07 MED ORDER — IPRATROPIUM-ALBUTEROL 0.5-2.5 (3) MG/3ML IN SOLN: 3.0000 mL | Freq: Four times a day (QID) | RESPIRATORY_TRACT | Status: DC | PRN

## 2019-06-07 NOTE — Progress Notes (Signed)
Physical Therapy Treatment Patient Details Name: Alan Small MRN: [id number] DOB: 1956-04-12 Today's Date: 06/07/2019    History of Present Illness Patient is a 64 y/o male who presents with empyema. Chest CT 12/20- showed evidence of recollection of pleural fluid. Chest tube was inserted 05/22/2019. s/p left loculated effusion and VATs 12/23. Multiple admissions at [facility], [facility] recently. PMH includes left staghorn calculus SP stenting, urethral stricture SP urethroplasty, COPD, depression, paranoid schizophrenia, renal failure, CVA, seizures, MI.    PT Comments    Pt in bed upon arrival of PT, agreeable to PT session this afternoon. Pt with sig limitations in functional mobility and strength due to progression of generalized weakness associated with above diagnosis. The pt was only able to perform limited bed-level exercises today prior to onset of dizziness which limited further transfers and mobility. The pt reported his dizziness onset with the exercises, subsided with rest. The pt had a slight drop in SpO2 during bed exercises (to 81%), but was able to recover to 98% with guided breathing exercises. The pt will continue to benefit from skilled PT to progress mobility and strengthening.    Follow Up Recommendations  SNF     Equipment Recommendations  Rolling walker with 5" wheels    Recommendations for Other Services       Precautions / Restrictions Precautions Precautions: Fall Precaution Comments: h/o seizures, DOE Restrictions Weight Bearing Restrictions: No    Mobility  Bed Mobility Overal bed mobility: Needs Assistance(pt became dizzy with bed-level exercises, did not attempt transfer/further mobility)                Transfers Overall transfer level: Needs assistance(pt became dizzy with bed-level exercises, did not attempt transfer/further mobility)                  Ambulation/Gait                 Stairs             Wheelchair Mobility     Modified Rankin (Stroke Patients Only)       Balance                                            Cognition Arousal/Alertness: Awake/alert(slightly somulent, able to arise with activity, but still with flat affect) Behavior During Therapy: [facility] for tasks assessed/performed;Flat affect Overall Cognitive Status: Within Functional Limits for tasks assessed                                 General Comments: pt agreeable to therapy but minimally expressive, flat affect, slight mumbling/difficult to understand at times      Exercises Total Joint Exercises Ankle Circles/Pumps: Strengthening;Both;Supine;10 reps Quad Sets: Strengthening;Both;10 reps;Supine Hip ABduction/ADduction: Both;10 reps;AROM;Supine    General Comments General comments (skin integrity, edema, etc.): pt became dizzy with bed-level exercises, did not attempt transfer/further mobility      Pertinent Vitals/Pain Pain Assessment: No/denies pain(pt reports no pain but dizziness onset with bed exercises) Pain Intervention(s): Limited activity within patient's tolerance;Monitored during session    Home Living                      Prior Function            PT Goals (current goals can now  be found in the care plan section) Acute Rehab PT Goals Patient Stated Goal: To get stronger and feel better PT Goal Formulation: With patient Time For Goal Achievement: 2019-06-26 Potential to Achieve Goals: Good Progress towards PT goals: Goals downgraded-see care plan    Frequency    Min 2X/week      PT Plan Current plan remains appropriate    Co-evaluation              AM-PAC PT "6 Clicks" Mobility   Outcome Measure  Help needed turning from your back to your side while in a flat bed without using bedrails?: A Lot Help needed moving from lying on your back to sitting on the side of a flat bed without using bedrails?: A Lot Help needed moving to and from a bed to a  chair (including a wheelchair)?: Total Help needed standing up from a chair using your arms (e.g., wheelchair or bedside chair)?: Total Help needed to walk in hospital room?: Total Help needed climbing 3-5 steps with a railing? : Total 6 Click Score: 8    End of Session Equipment Utilized During Treatment: Other (comment);Oxygen(none) Activity Tolerance: Patient limited by fatigue;Other (comment)(dizziness) Patient left: with call bell/phone within reach;in bed Nurse Communication: Mobility status PT Visit Diagnosis: Difficulty in walking, not elsewhere classified (R26.2);Muscle weakness (generalized) (M62.81);Pain Pain - Right/Left: (generalized) Pain - part of body: (generalized)     Time: 4650-3546 PT Time Calculation (min) (ACUTE ONLY): 18 min  Charges:  $Therapeutic Exercise: 8-22 mins                     Karma Ganja, PT, DPT   Acute Rehabilitation Department [phone]   Otho Bellows 06/07/2019, 3:44 PM

## 2019-06-07 NOTE — Progress Notes (Signed)
Nutrition Brief Note  Chart reviewed. Pt now transitioning to comfort care.  No further nutrition interventions warranted at this time.  Please re-consult as needed.   Delina Kruczek RD, LDN Clinical Nutrition Pager # - [phone]    

## 2019-06-07 NOTE — Plan of Care (Signed)

## 2019-06-07 NOTE — Progress Notes (Signed)
[facility] Progress Note    Assessment/ Plan:   1. Nonoliguric AoCKD4: labile but baseline recently around 2.5.  Likley multifactorial.  Obstruction ruled out with renal US but stent not able to be visualized.  CT abd/ pelvis with stent in correct place, no hydro.  Complements are normal. Syn-infectious GN possible esp with MRSA however UA not particularly suggestive.  AIN possible but unclear if switching from ceftaroline to another agent would be advisable.  I would not support renal biopsy given solitary kidney with advanced CKD already + inability to immunosuppress given disseminated infection.      He is a poor longterm dialysis candidate given mult recent admissions, disseminated MRSA,  SNF status prior to admission and I would not pursue this therapy.  Given solitary kidney and advanced CKD at baseline I don't believe this is reversible and would not recommend a trial of short term dialysis eithr.   Pall care c/s, appreciate assistance.  Recommend palliative therapy/hospice at this point.  D/w state guardian who is supportive of decision.   2. Solitary L kidney, remote nephrectomy 3. Hyperkalemia, mild--> stable on lokelma 10 mg BID 4. Mild metabolic acidosis, on bicarb 1300 TID; bicarb 16 this AM, CTM. 5. Nephrolithiasis, current L ureteral stent; hx/o infected stones 6. Disseminated MRSA: empyema (s/p VATS--> decortication and wedge resection), septic emboly, epidural abscess, pyomyositis s/p I&D. On Ceftaroline.  7. Schizophrenia 8. COPD 9. UTI - urine culture 1/1 Klebsiella + VRE, reviewed sensitivities > on antibiotic coverage  Subjective:    UOP minimal yesterday despite IV lasix dose. Bladder scan no retention.   BUN/Cr continue to rise. Notes dysgeusia.  He denies complaints  Palliative care and myself d/w state guardian.    Objective:   BP (!) 104/55   Pulse 74   Temp 97.6 F (36.4 C) (Oral)   Resp 12   Ht 6' (1.829 m)   Wt 108.5 kg   SpO2 98%   BMI  32.44 kg/m   Intake/Output Summary (Last 24 hours) at 06/07/2019 3299 Last data filed at 06/06/2019 1900 Gross per 24 hour  Intake 0 ml  Output 20 ml  Net -20 ml   Weight change: 0.7 kg  Physical Exam: Gen: sitting in bed NAD CVS: RRR no m/r/g Resp: on O2, normal WOB Abd: nontender Ext:2+ anasarca  Imaging: No results found.  Labs: BMET Recent Labs  Lab 06/01/19 1328 06/01/19 1510 06/02/19 0251 06/03/19 2426 06/04/19 0635 06/05/19 0529 06/06/19 0516 06/07/19 0316  NA 134*  --  135 137 136 132* 135 136  K 6.3* 5.3* 5.2* 5.4* 5.4* 5.4* 5.4* 5.2*  CL 108  --  106 110 106 104 107 106  CO2 18*  --  20* 20* 18* 18* 18* 16*  GLUCOSE 118*  --  90 100* 84 97 87 80  BUN 60*  --  65* 80* 90* 102* 112* 118*  CREATININE 3.29*  --  3.83* 4.54* 5.00* 5.59* 6.26* 6.67*  CALCIUM 8.0*  --  8.0* 7.6* 7.4* 7.4* 7.3* 7.4*  PHOS  --   --   --   --   --   --   --  9.1*   CBC Recent Labs  Lab 06/03/19 0659 06/04/19 0635 06/05/19 0529 06/06/19 0516 06/07/19 0316  WBC 9.3 11.3* 9.6 9.9 10.1  NEUTROABS 6.0 9.7* 6.3 7.9*  --   HGB 7.1* 8.4* 7.2* 8.0* 8.2*  HCT 23.6* 26.9* 23.0* 26.4* 27.5*  MCV 95.9 93.1 92.4 95.0 96.8  PLT 209 250 222 220 224    Medications:    . bisacodyl  10 mg Oral Daily  . Chlorhexidine Gluconate Cloth  6 each Topical Daily  . feeding supplement (NEPRO CARB STEADY)  237 mL Oral BID BM  . feeding supplement (PRO-STAT SUGAR FREE 64)  30 mL Oral BID  . ferrous sulfate  325 mg Oral Q breakfast  . heparin injection (subcutaneous)  5,000 Units Subcutaneous Q8H  . insulin aspart  0-24 Units Subcutaneous TID AC & HS  . ipratropium-albuterol  3 mL Nebulization BID  . levothyroxine  50 mcg Oral QAC breakfast  . metoprolol tartrate  25 mg Oral BID  . multivitamin with minerals  1 tablet Oral Daily  . nicotine  21 mg Transdermal Daily  . pantoprazole (PROTONIX) IV  40 mg Intravenous Q12H  . senna-docusate  1 tablet Oral QHS  . sodium bicarbonate  1,300 mg Oral  TID  . sodium zirconium cyclosilicate  10 g Oral BID

## 2019-06-07 NOTE — Progress Notes (Signed)
Patient experiencing belly pain at this time and refused HHN.  He said his breathing is fine.

## 2019-06-07 NOTE — Progress Notes (Signed)
PMT provider still attempting to get in contact with DSS legal guardian. VM left for Adult Care Supervisor, [name], urging a call back as soon as possible for [place] discussion for this patient. Patient's documented legal guardian, [name] is out of office until 06/13/19. Will follow.  NO CHARGE  Ihor Dow, [place], FNP-C Palliative Medicine Team  Phone: [phone] Fax: [phone]

## 2019-06-07 NOTE — Plan of Care (Signed)
Continue to monitor

## 2019-06-07 NOTE — Progress Notes (Signed)
Daily Progress Note   Patient Name: Alan Small       Date: 06/07/2019 DOB: [birth year]  Age: 64 y.o. MRN#: [id number] Attending Physician: Alan Kirks, DO Primary Care Physician: Alan Median, MD Admit Date: 05/04/2019  Reason for Consultation/Follow-up: Establishing goals of care  Subjective: Drowsy. No complaints. Worsening BUN/Cr. Poor urine output despite aggressive IV lasix.   GOC:  Patient has documented [place] legal guardian, Alan Small. Alan Small is out of the office until 06/13/19. Spoke with Adult Care Supervisor, Alan Small to discuss goals of care for Alan Small.   Prior to hospital admission in September, Alan Small was living at family care home. He was active and independent of ADL's. He would attend church with friends and walked to store. He has a long-time girlfriend.   After hospitalization in September, he was discharged to SNF for rehab. Infection has unfortunately not cleared.   Discussed in detail course of hospitalization including diagnoses, interventions, plan of care. Discussed worsening renal function and failure, poor urine output, and uremia--essentially requiring dialysis but his poor candidacy for dialysis based on expert opinion of nephrologist.   Alan Small confirms that Alan Small has documented durable DNR since September 2017. She will send via secure email.  (PMT provider received and reviewed). Code status will be changed to DNR.   Alan Small was able to speak with Alan Small about worsening kidney function and recommendations against dialysis with baseline advanced CKD. Recommendation for hospice services.    Shortly after, follow-up with Alan Small about plan of care. Alan Small is appreciative of recommendations from nephrology and is  supportive of recommendation for comfort/hospice facility, understanding prognosis of likely weeks. Discussed hospice philosophy and options. Discussed recommendation for hospice facility placement. Alan Small will need to approve with her director before final decisions are made. Alan Small requests documentation/recommendations from nephrology, attending, and PMT provider. MOST form will also be completed with recommendations. Alan Small has PMT contact information.   Length of Stay: 14  Current Medications: Scheduled Meds:  . bisacodyl  10 mg Oral Daily  . Chlorhexidine Gluconate Cloth  6 each Topical Daily  . feeding supplement (NEPRO CARB STEADY)  237 mL Oral BID BM  . feeding supplement (PRO-STAT SUGAR FREE 64)  30 mL Oral BID  . ferrous sulfate  325 mg Oral Q breakfast  .  heparin injection (subcutaneous)  5,000 Units Subcutaneous Q8H  . insulin aspart  0-24 Units Subcutaneous TID AC & HS  . ipratropium-albuterol  3 mL Nebulization BID  . levothyroxine  50 mcg Oral QAC breakfast  . metoprolol tartrate  25 mg Oral BID  . multivitamin with minerals  1 tablet Oral Daily  . nicotine  21 mg Transdermal Daily  . pantoprazole (PROTONIX) IV  40 mg Intravenous Q12H  . senna-docusate  1 tablet Oral QHS  . sodium bicarbonate  1,300 mg Oral TID  . sodium zirconium cyclosilicate  10 g Oral BID    Continuous Infusions: . sodium chloride    . ceftaroline (TEFLARO) 400 mg IVPB 300 mg (06/07/19 0521)  . dextrose 50 mL/hr at 06/07/19 0819  . meropenem [facility]) IV 1 g (06/06/19 2208)    PRN Meds: Place/Maintain arterial line **AND** sodium chloride, acetaminophen **OR** acetaminophen, fentaNYL (SUBLIMAZE) injection, ipratropium-albuterol, LORazepam, ondansetron (ZOFRAN) IV, oxyCODONE-acetaminophen, polyethylene glycol  Physical Exam Vitals and nursing note reviewed.  Constitutional:      Appearance: He is ill-appearing.  HENT:     Head: Normocephalic and atraumatic.  Pulmonary:     Effort: No  tachypnea, accessory muscle usage or respiratory distress.  Abdominal:     Tenderness: There is no abdominal tenderness.  Skin:    General: Skin is warm and dry.     Comments: Anasarca   Neurological:     Mental Status: He is easily aroused.     Comments: Oriented to person/placed. Disoriented to time/situation. Drowsy. No complaints.   Psychiatric:        Attention and Perception: He is inattentive.        Speech: Speech is delayed.        Cognition and Memory: Cognition is impaired.            Vital Signs: BP (!) 104/54   Pulse 74   Temp 97.6 F (36.4 C) (Oral)   Resp (!) 9   Ht 6' (1.829 m)   Wt 108.5 kg   SpO2 98%   BMI 32.44 kg/m  SpO2: SpO2: 98 % O2 Device: O2 Device: Nasal Cannula O2 Flow Rate: O2 Flow Rate (L/min): 2 L/min  Intake/output summary:   Intake/Output Summary (Last 24 hours) at 06/07/2019 0958 Last data filed at 06/07/2019 0600 Gross per 24 hour  Intake 0 ml  Output 40 ml  Net -40 ml   LBM: Last BM Date: 06/07/19 Baseline Weight: Weight: 90.1 kg Most recent weight: Weight: 108.5 kg       Palliative Assessment/Data: PPS 30%      Patient Active Problem List   Diagnosis Date Noted  . Palliative care by specialist   . Goals of care, counseling/discussion   . Pyomyositis 05/20/2019  . Empyema of pleural space ([place]) 05/30/2019  . Septic embolism ([place]) 05/20/2019  . Epidural abscess 05/22/2019  . ETOH abuse 05/23/2019  . Atrial flutter ([place])   . MRSA bacteremia   . Community acquired pneumonia 01/20/2019  . Gastroenteritis 01/20/2019  . Blood in stool   . Change in bowel habits   . Constipation   . Polyp of sigmoid colon   . Depression 02/26/2016  . COPD (chronic obstructive pulmonary disease) ([place]) 02/03/2016  . Suprapubic catheter ([place]) 12/17/2015  . Urethral stricture 12/17/2015  . Urinary retention 10/27/2015  . Tobacco use disorder 08/27/2015  . Malnutrition of moderate degree 05/09/2015  . Sepsis ([place]) 05/06/2015  .  Pyohydronephrosis 01/17/2015  . Hypoglycemia 01/17/2015  .  Gastroesophageal reflux disease 10/28/2014  . Bright red rectal bleeding 10/26/2014  . Chronic constipation 10/26/2014  . Hydronephrosis of left kidney 01/17/2014  . Neurogenic bladder 06/21/2013  . Urolithiasis 03/15/2013  . Nephrolithiasis 03/04/2013  . Metabolic acidosis 92/42/6834  . Acute renal failure ([place]) 11/03/2012  . Hyperkalemia 11/03/2012  . Pyelonephritis 11/03/2012  . Schizophrenia ([place]) 11/03/2012  . Hypothyroidism 11/03/2012  . Severe sepsis ([place]) 11/03/2012  . Protein-calorie malnutrition, severe ([place]) 11/03/2012  . Encephalopathy acute 11/03/2012  . Chronic kidney disease, stage IV (severe) ([place]) 07/02/2012  . 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 Kidney stone 04/13/2012    Palliative Care Assessment & Plan   Patient Profile: 64 y.o. male  with past medical history of solitary left kidney, CKD stage IV, left staghorn calculus s/p stenting, urethral stricture s/p urethroplasty, schizophrenia, COPD, depression. Patient admitted to [facility] between February 10, 2019-March 02, 2019. Patient treated for pyelonephritis and retroperitoneal fluid collection concerning for abscess on 9/17 with JP drain placement. PICC placed and prolonged course of antibiotics. Current hospitalization due to severe sepsis with septic shock secondary to disseminated MRSA, pyomyositis s/p right flank drain, left sided empyema s/p thoracentesis/VATS, epidural abscess being conservatively managed, worsening nonoliguric AKI on CKD4 with solitary left kidney, afib/flutter, acute hypoxic respiratory failure, acute blood loss anemia with underlying paranoid schizophrenia/chronic anxiety. Urine culture with klebsiella pneumonia. Blood cultures have grown out MRSA. F/u blood cultures 05/09/2019 show no growth. Patient will require 6 weeks of IV Teflaro and ID follow-up. Patient with worsening kidney function and worsening urine output. Per nephrology, patient  is a poor dialysis candidate given hospital admissions and disseminated MRSA. Palliative medicine consultation for goals of care.   Assessment: Severe sepsis Disseminated MRSA infection Klebsiella pneumoniae UTI Left empyema s/p VATS Pyomyositis  Worsening nonoliguric AKI on CKD stage 4 Solitary left kidney Anasarca Refractory hyperkalemia Obstructing/infected renal calculus AFib/flutter Acute hypoxic respiratory failure Acute transaminitis Hx of schizophrenia  Recommendations/Plan:  Code status changed to DNR. Patient has documented durable DNR from September 2017. Confirmed with DSS legal guardian.   Patient with worsening renal function, urine output, and uremia. Appreciative nephrology recommendations. Patient is a poor candidate for hemodialysis given recent admissions, disseminated MRSA, solitary kidney, advanced CKD at baseline. Nephrology does NOT recommend trial of hemodialysis.   Recommend transition to comfort measures and initiation of hospice services. Patient is eligible for hospice facility placement with prognosis of weeks or less.  MOST form recommendation: DNR/DNI, comfort focused care and hospice facility placement, discontinuation of IVF/ABX if transferred to hospice facility, and NO feeding tube.   Paperwork faxed to [place] legal guardian, Alan Small. Await final decision regarding comfort/hospice facility.   Code Status: DNR   Code Status Orders  (From admission, onward)         Start     Ordered   05/17/2019 2245  Full code  Continuous     05/15/2019 2247        Code Status History    Date Active Date Inactive Code Status Order ID Comments User Context   05/11/2019 1614 05/23/2019 2155 Full Code [id number]  Tyler Pita, MD Inpatient   01/20/2019 1332 01/24/2019 1834 Full Code [id number]  Vaughan Basta, MD ED   08/20/2018 1729 08/22/2018 [place] Full Code [id number]  Fritzi Mandes, MD Inpatient   02/26/2016 1748 02/28/2016 2109 Full Code  [id number]  Henreitta Leber, MD Inpatient   05/06/2015 1856 05/15/2015 0115 Full Code [id number]  Henreitta Leber, MD Inpatient  01/17/2015 1206 01/22/2015 1645 Full Code [id number]  Hillary Bow, MD ED   11/03/2012 0300 11/11/2012 2134 Full Code [id number]  Rise Patience, MD Inpatient   Advance Care Planning Activity       Prognosis:   Poor prognosis likely 2 weeks or less with worsening AKI with underlying CKD stage IV and not a candidate for dialysis, poor urine output, uremia, and disseminated MRSA. Severe decline in functional, cognitive, nutritional status.   Discharge Planning:  To Be Determined: recommend hospice facility placement for EOL care.   Care plan was discussed with RN, Dr. Benny Lennert, Alan Small, discussed in detail with DSS legal guardian Alan Small).  Thank you for allowing the Palliative Medicine Team to assist in the care of this patient.   Time In: 0930- 1120- Time Out: 1030 1200 Total Time 100 Prolonged Time Billed   yes      Greater than 50%  of this time was spent counseling and coordinating care related to the above assessment and plan.  Ihor Dow, DNP, FNP-C Palliative Medicine Team  Phone: [phone] Fax: [phone]  Please contact Palliative Medicine Team phone at [phone] for questions and concerns.

## 2019-06-07 NOTE — Progress Notes (Signed)
Patient has not voided today on this nurses shift. Patient bladder scanned at this time. 90 ml urine in bladder. Patient does not feel the urge to void and has no discomfort other than the taunt abdomen he has been experiencing. Will continue to monitor

## 2019-06-07 NOTE — Progress Notes (Signed)
PROGRESS NOTE  BESSIE LIVINGOOD NUU:[id number] DOB: 05/25/1956 DOA: 05/17/2019 PCP: [name], MD  Brief History   [name] a 64 y.o.malewith medical history significant ofsolitary left kidney, CKD stage IV, left staghorn calculus SP stenting, urethral stricture SP urethroplasty, schizophrenia, clinically diagnosed COPD, depression.  Patient was admitted at [facility] between February 10, 2019 and March 02, 2019. Patient was treated for pyelonephritis and retroperitoneal fluid collection concerning for abscess. Urine culture grew MDR E. coli. Patient underwent aspiration of the abscess on 9/17 with JP drain placement. Patient was started on IV imipenem with a PICC line. Summary ofhisactive problems in the hospital is as following.Underwent left percutaneous nephrolithotomy 03/28/2019 and ureteral stent placement at the same time. Underwent laser lithotripsy and basket extraction on 04/05/2019. Patient was recommended to continue the antibiotics until April 16, 2019.  Patient presented to [facility] on 05/11/2019 with shortness of breath left leg pain and fever and nausea and vomiting. CT scan showed evidence of stone on the left side. Patient was initially placed on the BiPAP. His oxygenation worsened and patient actually required intubation in the emergency department. Urology was consulted and patient underwent urgent cystoscopy and left ureteral stent placement. Patient was started on broad-spectrum antibiotics and his blood cultures came back positive for MRSA. Patient was extubated on 05/20/2019 and since then has remained on nasal cannula. Patient's PICC line was removed on 05/12/2019 and a central line was placed which was removed on 05/18/2019. Foley catheter was inserted on 05/11/2019 after the urology procedure and removed on 05/18/2019. TEE performed on that day as well demonstrated an EF of 34-74% normal systolic function of the right ventricle. There were no  regional wall motion abnormalities and diastolic dysfunction could not be evaluated. It showed no evidence of endocarditis but the patient does have evidence of septic emboli with cavitary lesions on bilateral lung parenchyma therefore patient is treated as infective endocarditis. On 05/19/2019 left thoracentesis/pleural fluid culture came back positive for MRSA. Report showed loculated effusion post procedure. 05/20/2019 had pyomyositis underwent right-sided drain placement after incision and debridement. 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 [facility] neurosurgery was consulted who recommended conservative management. Dr. [name] was on call. Due to worsening shortness of breath CT chest performed on 05/22/2019 showed evidence of recollection of pleural fluid. Chest tube was inserted by PCCM on 05/22/2019. VATS was performed on 12/23. This was well tolerated well. The patient is on Ceftaroline now. Merrem added to cover klebsiella grown from urine culture. Clinically improving.  Hemoglobin 12/29 8.5 from 6.7 post 1 unit PRBC transfusion 12/28.    Nephrology has recommended against hemodialysis and for palliative care and hospice. Palliative care has been able to reach guardian and has made a recommendation for comfort care and discharge to hospice. They are considering these recommendations.  Consultants  . Nephrology . Infectious Disease . General Surgery . Urology  Antibiotics   Anti-infectives (From admission, onward)   Start     Dose/Rate Route Frequency Ordered Stop   06/05/19 1245  meropenem (MERREM) 1 g in sodium chloride 0.9 % 100 mL IVPB     1 g 200 mL/hr over 30 Minutes Intravenous Every 12 hours 06/05/19 1237     06/01/19 1400  ceftaroline (TEFLARO) 300 mg in sodium chloride 0.9 % 250 mL IVPB     300 mg 125 mL/hr over 120 Minutes Intravenous  Every 8 hours  06/01/19 0818     05/28/19 1400  ceftaroline (TEFLARO) 400 mg in sodium chloride 0.9 % 250 mL IVPB  Status:  Discontinued     400 mg 125 mL/hr over 120 Minutes Intravenous Every 8 hours 05/28/19 1009 06/01/19 0819   05/26/19 1400  ceftaroline (TEFLARO) 300 mg in sodium chloride 0.9 % 250 mL IVPB  Status:  Discontinued     300 mg 125 mL/hr over 120 Minutes Intravenous Every 8 hours 05/26/19 0731 05/28/19 1009   06/01/2019 2200  ceftaroline (TEFLARO) 400 mg in sodium chloride 0.9 % 250 mL IVPB  Status:  Discontinued     400 mg 250 mL/hr over 60 Minutes Intravenous Every 8 hours 05/16/2019 1050 05/26/19 0731   05/31/2019 1342  ceFAZolin (ANCEF) IVPB 2g/100 mL premix     2 g 200 mL/hr over 30 Minutes Intravenous 30 min pre-op 05/13/2019 1342 05/15/2019 1454   05/28/2019 2245  ceftaroline (TEFLARO) 600 mg in sodium chloride 0.9 % 250 mL IVPB  Status:  Discontinued     600 mg 250 mL/hr over 60 Minutes Intravenous Every 12 hours 05/29/2019 2231 05/17/2019 2239   06/02/2019 2245  linezolid (ZYVOX) IVPB 600 mg  Status:  Discontinued     600 mg 300 mL/hr over 60 Minutes Intravenous Every 12 hours 05/29/2019 2231 05/03/2019 1050   05/27/2019 2245  ceftaroline (TEFLARO) 400 mg in sodium chloride 0.9 % 250 mL IVPB  Status:  Discontinued     400 mg 250 mL/hr over 60 Minutes Intravenous Every 12 hours 05/23/2019 2239 05/08/2019 1050     Subjective  The patient is resting comfortably in bed. No new complaints. He has had very poor urine output today.  Objective   Vitals:  Vitals:   06/07/19 1612 06/07/19 1627  BP: (!) 93/34 94/60  Pulse: 70 69  Resp: 15 15  Temp: 97.7 F (36.5 C)   SpO2: 98% 95%    Exam:  Constitutional:  The patient is awake, alert, and oriented x 3. No acute distress. He appears chronically and acutely ill. Respiratory:  No increased work of breathing. No wheezes, rales, or rhonchi No tactile fremitus Cardiovascular:  Regular rate and rhythm No murmurs, ectopy, or gallups. No lateral PMI.  No thrills. Abdomen:  Abdomen is soft, non-tender, non-distended No hernias, masses, or organomegaly Normoactive bowel sounds.  Musculoskeletal:  No cyanosis, clubbing, or edema Skin:  No rashes, lesions, ulcers palpation of skin: no induration or nodules Neurologic:  CN 2-12 intact Sensation all 4 extremities intact Psychiatric:  Mental status Mood, affect appropriate Orientation to person, place, time  judgment and insight appear intact  I have personally reviewed the following:   Today's Data  . Vitals, BMP, CBC  Micro Data  . Urine culture has grown out more than 100K of klebsiella pneumoniae. It has been shown to be sensitive to imipenem.  Scheduled Meds: . bisacodyl  10 mg Oral Daily  . Chlorhexidine Gluconate Cloth  6 each Topical Daily  . feeding supplement (NEPRO CARB STEADY)  237 mL Oral BID BM  . feeding supplement (PRO-STAT SUGAR FREE 64)  30 mL Oral BID  . ferrous sulfate  325 mg Oral Q breakfast  . heparin injection (subcutaneous)  5,000 Units Subcutaneous Q8H  . insulin aspart  0-24 Units Subcutaneous TID AC & HS  . ipratropium-albuterol  3 mL Nebulization BID  . levothyroxine  50 mcg Oral QAC breakfast  . metoprolol tartrate  25 mg Oral BID  .  multivitamin with minerals  1 tablet Oral Daily  . nicotine  21 mg Transdermal Daily  . pantoprazole (PROTONIX) IV  40 mg Intravenous Q12H  . senna-docusate  1 tablet Oral QHS  . sodium bicarbonate  1,300 mg Oral TID  . sodium zirconium cyclosilicate  10 g Oral BID   Continuous Infusions: . sodium chloride    . ceftaroline (TEFLARO) 400 mg IVPB 300 mg (06/07/19 1419)  . meropenem (MERREM) IV 1 g (06/07/19 1031)    Principal Problem:   Empyema of pleural space (HCC) Active Problems:   Schizophrenia (HCC)   Hypothyroidism   Severe sepsis (HCC)   Pyohydronephrosis   Kidney stone   Chronic kidney disease, stage IV (severe) (HCC)   COPD (chronic obstructive pulmonary disease) (HCC)   Atrial flutter (HCC)    MRSA bacteremia   Septic embolism (HCC)   Epidural abscess   ETOH abuse   Palliative care by specialist   Goals of care, counseling/discussion   LOS: 14 days   A & P  Severe sepsis (POA)with septic shock secondary to disseminated MRSA infection thought to have stemmed from a tunneled PICC line that was not maintained or removed following 8-week course of IV antibiotics: Resolved sepsis. Pt also has pyomyositis (POA) for which he has had a right flank drain placed. He also has a left sided empyema s/p thoracentesis/VATS (POA). Chest tube has been placed and now removed. CTS has signed off. There is also an epidural abscess that is being addressed conservatively as recommended by neurosurgery. Urine culture collected on 06/02/2018 has grown out klebsiella pneumoniae. Pt is still receiving Teflaro. Sensitivities are pending. Urology has also signed off. Blood cultures drawn on admission have grown out MRSA. Follow up blood cultures drawn on 05/11/2019 have had no growth. He will require 6 weeks of IV Teflaro. He will need to follow up with Dr. Delaine Lame as outpatient. Palliative care has been consulted. Palliative care has been able to reach guardian and has made a recommendation for comfort care and discharge to hospice. They are considering these recommendations.  UTI: Urine culture has grown out more than 100 thousand CFU of klebsiella pneumoniae. The organism has been shown to be sensitive to imipanem. He has been started on meropenem.  Anasarca: Diffuse edema with worsening renal function. Holding off IV fluids. Nephrology consulted and following.  Worsening nonoliguric AKI on CKD 4 in the setting of solitary left kidney: Nephrology consulted. Pt is a poor candidate for HD. Palliative care has been consulted. Avoid nephrotoxins and hypotension. Renal ultrasound done on 05/23/2019 showed mild left hydronephrosis/pelviectasis, post right nephrectomy, mild bladder wall thickening. Baseline  creatinine appears to be 2.3 with GFR in the 28. Creatinine worsening daily. Creatinine today is 6.26. Rate of increase does seem to be declining, however. Continue to monitor electrolytes, creatinine, and volume status. Nephrology has recommended against hemodialysis and for palliative care and hospice. Palliative care has been able to reach guardian and has made a recommendation for comfort care and discharge to hospice. They are considering these recommendations.  Non anion gap metabolic acidosis likely in the setting of worsening renal function: Improving on p.o. sodium bicarb 1300 mg twice daily. Managed by nephrology. Monitor.  Refractory hyperkalemia in the setting of worsening renal function: Potassium 5.4>> 5.4.  Continue Lokelma 10 g daily as recommended by nephrology.  Obstructing/infected renal calculus, Concurrent VRE UTI, likely POA, S/P emergency ureteral stent placement: Cystoscopy on 05/11/2019 and also was found to have a bulbar urethral stricture. The  stricture was dilated. The stent was placed in the left ureter. The urine culture on 05/11/2019 was negative. A repeat urine culture was sent on 05/16/2019 and  positive for VRE. On ceftaroline per ID. Urine culture collected on 06/02/2018 has grown out more than 100 thousand CFU of klebsiella pneumoniae which is sensitive to imipenem. The patient is on Teflaro and cefepime.   A. fib/flutter with rapid ventricular rate, resolved: Rate is currently controlled on p.o. Lopressor 25 mg twice daily. Anticoagulation not recommended due to history of fall and anemia. Echo with normal LV function. Continue to monitor on telemetry.  Acute hypoxic respiratory failure likely in the setting of left empyema and pulmonary edema: Not on oxygen supplementation at baseline. Currently requiring at least 2 L to maintain O2 saturation greater than 92%. Continue pulmonary toilet, incentive spirometer, flutter valve, and IV antibiotics.  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 Monitor hemoglobin. Multifactorial in the setting of worsening renal function, internal hemorrhoids with positive FOBT, iron deficiency. He has received 1 dose of Feraheme 520 mg once on 06/02/2019. He will be continued on daily ferrous sulfate. Transfuse for hemoglobin less than 7.0. hemoglobin is 8.0 today.  Acute transaminitis: Acute hepatitis panel negative. CT abdomen and pelvis done on 05/18/2019 without contrast: Multiple low-density lesions again seen within the liver, unchanged, likely cysts. Large exophytic cyst off the left. The hepatic lobe is stable. Gallbladder unremarkable. Trend LFTs and continue to avoid hepatotoxins.  Alcohol withdrawal syndrome, resolved: Out of alcohol withdrawal window.  No sign of alcohol withdrawal.  Paranoid schizophrenia/chronic anxiety: Stable.  Severe physical debility: PT OT assessed and recommended SNF. CSW assisting with placement. Continue PT OT with assistance and fall precautions. Out of bed to chair as tolerated.  I have seen and examined this patient myself. I have spent 30 minutes in his evaluation and care.  DVT prophylaxis:Heparin sq TID Code Status:Full Family Communication:None at bedside Disposition Plan: Likely discharge to hospice on comfort care if guardian agrees to this plan.  Pat Elicker, DO [facility] Direct contact: see [email or web address]  7PM-7AM contact night coverage as above 06/07/2019, 4:59 PM  LOS: 11 days

## 2019-06-08 LAB — BASIC METABOLIC PANEL
Anion gap: 11 (ref 5–15)
BUN: 122 mg/dL — ABNORMAL HIGH (ref 8–23)
CO2: 16 mmol/L — ABNORMAL LOW (ref 22–32)
Calcium: 7.4 mg/dL — ABNORMAL LOW (ref 8.9–10.3)
Chloride: 108 mmol/L (ref 98–111)
Creatinine, Ser: 7.31 mg/dL — ABNORMAL HIGH (ref 0.61–1.24)
GFR calc Af Amer: 8 mL/min — ABNORMAL LOW (ref >60)
GFR calc non Af Amer: 7 mL/min — ABNORMAL LOW (ref >60)
Glucose, Bld: 88 mg/dL (ref 70–99)
Potassium: 5 mmol/L (ref 3.5–5.1)
Sodium: 135 mmol/L (ref 135–145)

## 2019-06-08 LAB — CBC WITH DIFFERENTIAL/PLATELET
Abs Immature Granulocytes: 1.32 10*3/uL — ABNORMAL HIGH (ref 0.00–0.07)
Basophils Absolute: 0.1 10*3/uL (ref 0.0–0.1)
Basophils Relative: 1 %
Eosinophils Absolute: 0.6 10*3/uL — ABNORMAL HIGH (ref 0.0–0.5)
Eosinophils Relative: 6 %
HCT: 25.3 % — ABNORMAL LOW (ref 39.0–52.0)
Hemoglobin: 7.7 g/dL — ABNORMAL LOW (ref 13.0–17.0)
Immature Granulocytes: 14 %
Lymphocytes Relative: 6 %
Lymphs Abs: 0.6 10*3/uL — ABNORMAL LOW (ref 0.7–4.0)
MCH: 28.6 pg (ref 26.0–34.0)
MCHC: 30.4 g/dL (ref 30.0–36.0)
MCV: 94.1 fL (ref 80.0–100.0)
Monocytes Absolute: 1 10*3/uL (ref 0.1–1.0)
Monocytes Relative: 10 %
Neutro Abs: 5.9 10*3/uL (ref 1.7–7.7)
Neutrophils Relative %: 63 %
Platelets: 245 10*3/uL (ref 150–400)
RBC: 2.69 MIL/uL — ABNORMAL LOW (ref 4.22–5.81)
RDW: 15.9 % — ABNORMAL HIGH (ref 11.5–15.5)
WBC: 9.4 10*3/uL (ref 4.0–10.5)
nRBC: 0 % (ref 0.0–0.2)

## 2019-06-08 LAB — GLUCOSE, CAPILLARY: Glucose-Capillary: 88 mg/dL (ref 70–99)

## 2019-06-08 MED ORDER — FENTANYL CITRATE (PF) 100 MCG/2ML IJ SOLN
50.0000 ug | INTRAMUSCULAR | Status: DC | PRN
  Administered 2019-06-08 – 2019-06-10 (×7): 100 ug via INTRAVENOUS
  Filled 2019-06-08 (×7): qty 2

## 2019-06-08 MED ORDER — GLYCOPYRROLATE 0.2 MG/ML IJ SOLN: 0.2000 mg | INTRAMUSCULAR | Status: DC | PRN

## 2019-06-08 MED ORDER — BISACODYL 5 MG PO TBEC: 10.0000 mg | DELAYED_RELEASE_TABLET | Freq: Every day | ORAL | Status: DC | PRN

## 2019-06-08 NOTE — Progress Notes (Signed)
Arrangements are being made for Mr. Mineer to move to hospice facility which I support.  See yesterday's note for detailed assessment and thoughts.  If I can be of further support or assistance please don't hesitate to contact me.

## 2019-06-08 NOTE — Plan of Care (Signed)
Continue to monitor

## 2019-06-08 NOTE — Progress Notes (Signed)
PROGRESS NOTE  Alan Small HDQ:[id number] DOB: 1955-11-01 DOA: 05/22/2019 PCP: [name], MD  Brief History   Alan Small a 64 y.o.malewith medical history significant ofsolitary Alan Small, Alan Small, Alan Small, Alan Small, Alan Small, Alan Small, depression.  Patient was admitted at [facility] between February 10, 2019 and March 02, 2019. Patient was treated for pyelonephritis and retroperitoneal fluid collection concerning for abscess. Urine culture grew MDR E. coli. Patient underwent aspiration of the abscess on 9/17 with JP drain placement. Patient was started on Small imipenem with a PICC line. Summary ofhisactive problems in the hospital is as following.Underwent Alan percutaneous nephrolithotomy 03/28/2019 and ureteral stent placement at the same time. Underwent laser lithotripsy and basket extraction on 04/05/2019. Patient was recommended to continue the antibiotics until April 16, 2019.  Patient presented to [facility] on 05/11/2019 with shortness of breath Alan leg pain and fever and nausea and vomiting. CT scan showed evidence of stone on the Alan side. Patient was initially placed on the BiPAP. His oxygenation worsened and patient actually required intubation in the emergency department. Urology was consulted and patient underwent urgent cystoscopy and Alan ureteral stent placement. Patient was started on broad-spectrum antibiotics and his blood cultures came back positive for MRSA. Patient was extubated on 05/20/2019 and since then has remained on nasal cannula. Patient's PICC line was removed on 05/12/2019 and a central line was placed which was removed on 05/18/2019. Foley catheter was inserted on 05/11/2019 after the urology procedure and removed on 05/18/2019. TEE performed on that day as well demonstrated an EF of 11-94% normal systolic function of the right ventricle. There were no  regional wall motion abnormalities and diastolic dysfunction could not be evaluated. It showed no evidence of endocarditis but the patient does have evidence of septic emboli with cavitary lesions on bilateral lung parenchyma therefore patient is treated as infective endocarditis. On 05/19/2019 Alan thoracentesis/pleural fluid culture came back positive for MRSA. Report showed loculated effusion post procedure. 05/20/2019 had pyomyositis underwent right-sided drain placement after incision and debridement. 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 [facility] neurosurgery was consulted who recommended conservative management. Dr. [name] was on call. Due to worsening shortness of breath CT chest performed on 05/22/2019 showed evidence of recollection of pleural fluid. Chest tube was inserted by PCCM on 05/22/2019. VATS was performed on 12/23. This was well tolerated well. The patient is on Ceftaroline now. Merrem added to cover klebsiella grown from urine culture. Alan improving.  Hemoglobin 12/29 8.5 from 6.7 post 1 unit PRBC transfusion 12/28.    Nephrology has recommended against hemodialysis and for palliative care and hospice. Palliative care has been able to reach guardian and has made a recommendation for comfort care and discharge to hospice. The guardian has agreed to comfort care measures and discharge to a residential   Consultants   Nephrology  Infectious Disease  General Surgery  Urology  Antibiotics   Anti-infectives (From admission, onward)   Start     Dose/Rate Route Frequency Ordered Stop   06/05/19 1245  meropenem (MERREM) 1 g in sodium chloride 0.9 % 100 mL IVPB     1 g 200 mL/hr over 30 Minutes Intravenous Every 12 hours 06/05/19 1237     06/01/19 1400  ceftaroline (TEFLARO) 300 mg in sodium chloride 0.9 % 250 mL IVPB  300 mg 125 mL/hr over  120 Minutes Intravenous Every 8 hours 06/01/19 0818     05/28/19 1400  ceftaroline (TEFLARO) 400 mg in sodium chloride 0.9 % 250 mL IVPB  Status:  Discontinued     400 mg 125 mL/hr over 120 Minutes Intravenous Every 8 hours 05/28/19 1009 06/01/19 0819   05/26/19 1400  ceftaroline (TEFLARO) 300 mg in sodium chloride 0.9 % 250 mL IVPB  Status:  Discontinued     300 mg 125 mL/hr over 120 Minutes Intravenous Every 8 hours 05/26/19 0731 05/28/19 1009   05/23/2019 2200  ceftaroline (TEFLARO) 400 mg in sodium chloride 0.9 % 250 mL IVPB  Status:  Discontinued     400 mg 250 mL/hr over 60 Minutes Intravenous Every 8 hours 05/04/2019 1050 05/26/19 0731   05/17/2019 1342  ceFAZolin (ANCEF) IVPB 2g/100 mL premix     2 g 200 mL/hr over 30 Minutes Intravenous 30 min pre-op 05/29/2019 1342 05/24/2019 1454   05/22/2019 2245  ceftaroline (TEFLARO) 600 mg in sodium chloride 0.9 % 250 mL IVPB  Status:  Discontinued     600 mg 250 mL/hr over 60 Minutes Intravenous Every 12 hours 05/23/2019 2231 05/15/2019 2239   05/15/2019 2245  linezolid (ZYVOX) IVPB 600 mg  Status:  Discontinued     600 mg 300 mL/hr over 60 Minutes Intravenous Every 12 hours 05/30/2019 2231 05/05/2019 1050   05/17/2019 2245  ceftaroline (TEFLARO) 400 mg in sodium chloride 0.9 % 250 mL IVPB  Status:  Discontinued     400 mg 250 mL/hr over 60 Minutes Intravenous Every 12 hours 05/31/2019 2239 05/19/2019 1050     Subjective  The patient is resting comfortably in bed. No new complaints. He has had very poor urine output today.  Objective   Vitals:  Vitals:   06/08/19 0900 06/08/19 1000  BP:    Pulse: 74 80  Resp:    Temp:    SpO2: 94% 93%    Exam:  Constitutional:  The patient is awake, alert, and oriented x 3. No acute distress. He appears chronically and acutely ill. Respiratory:  No increased work of breathing. No wheezes, rales, or rhonchi No tactile fremitus Cardiovascular:  Regular rate and rhythm No murmurs, ectopy, or gallups. No lateral  PMI. No thrills. Abdomen:  Abdomen is soft, non-tender, non-distended No hernias, masses, or organomegaly Normoactive bowel sounds.  Musculoskeletal:  No cyanosis, clubbing, or edema Skin:  No rashes, lesions, ulcers palpation of skin: no induration or nodules Neurologic:  CN 2-12 intact Sensation all 4 extremities intact Psychiatric:  Mental status Mood, affect appropriate Orientation to person, place, time  judgment and insight appear intact  I have personally reviewed the following:   Today's Data   Vitals, BMP, CBC  Micro Data   Urine culture has grown out more than 100K of klebsiella pneumoniae. It has been shown to be sensitive to imipenem.  Scheduled Meds:  nicotine  21 mg Transdermal Daily   Continuous Infusions:  ceftaroline (TEFLARO) 400 mg IVPB 300 mg (06/08/19 0541)   meropenem (MERREM) Small 1 g (06/08/19 1021)    Principal Problem:   Empyema of pleural space (HCC) Active Problems:   Acute renal failure superimposed on chronic Small disease (HCC)   Alan Small (HCC)   Hypothyroidism   Severe sepsis (HCC)   Pyohydronephrosis   Small stone   Chronic Small disease, stage Small (severe) (HCC)   Small (chronic obstructive pulmonary disease) (HCC)   Atrial flutter (HCC)   MRSA  bacteremia   Septic embolism (HCC)   Epidural abscess   ETOH abuse   Palliative care by specialist   Terminal care   LOS: 15 days   A & P  Severe sepsis (POA)with septic shock secondary to disseminated MRSA infection thought to have stemmed from a tunneled PICC line that was not maintained or removed following 8-week course of Small antibiotics: Resolved sepsis. Pt also has pyomyositis (POA) for which he has had a right flank drain placed. He also has a Alan sided empyema s/p thoracentesis/VATS (POA). Chest tube has been placed and now removed. CTS has signed off. There is also an epidural abscess that is being addressed conservatively as recommended by neurosurgery. Urine  culture collected on 06/02/2018 has grown out klebsiella pneumoniae. Pt is still receiving Teflaro. Sensitivities are pending. Urology has also signed off. Blood cultures drawn on admission have grown out MRSA. Follow up blood cultures drawn on 05/28/2019 have had no growth. He will require 6 weeks of Small Teflaro. He will need to follow up with Dr. Delaine Lame as outpatient. Palliative care has been consulted. Palliative care has been able to reach guardian and has made a recommendation for comfort care and discharge to hospice. They are considering these recommendations.  UTI: Urine culture has grown out more than 100 thousand CFU of klebsiella pneumoniae. The organism has been shown to be sensitive to imipanem. He has been started on meropenem.  Anasarca: Diffuse edema with worsening renal function. Holding off Small fluids. Nephrology consulted and following.  Worsening nonoliguric AKI on Alan 4 in the setting of solitary Alan Small: Nephrology consulted. Pt is a poor candidate for HD. Palliative care has been consulted. Avoid nephrotoxins and hypotension. Renal ultrasound done on 05/23/2019 showed mild Alan hydronephrosis/pelviectasis, post right nephrectomy, mild bladder wall thickening. Baseline creatinine appears to be 2.3 with GFR in the 28. Creatinine worsening daily. Creatinine today is 6.26. Rate of increase does seem to be declining, however. Continue to monitor electrolytes, creatinine, and volume status. Nephrology has recommended against hemodialysis and for palliative care and hospice. Palliative care has been able to reach guardian and has made a recommendation for comfort care and discharge to hospice. He is now comfort care only.  Non anion gap metabolic acidosis likely in the setting of worsening renal function: Improving on p.o. sodium bicarb 1300 mg twice daily. Managed by nephrology. Monitor. He is now comfort care only.   Refractory hyperkalemia in the setting of worsening renal  function: Potassium 5.4>> 5.4.  Continue Lokelma 10 g daily as recommended by nephrology. He is now comfort care only.   Obstructing/infected renal calculus, Concurrent VRE UTI, likely POA, S/P emergency ureteral stent placement: Cystoscopy on 05/11/2019 and also was found to have a bulbar Alan stricture. The stricture was dilated. The stent was placed in the Alan ureter. The urine culture on 05/11/2019 was negative. A repeat urine culture was sent on 05/16/2019 and  positive for VRE. On ceftaroline per ID. Urine culture collected on 06/02/2018 has grown out more than 100 thousand CFU of klebsiella pneumoniae which is sensitive to imipenem. The patient is on Teflaro and cefepime. He is now comfort care only.  A. fib/flutter with rapid ventricular rate, resolved: Rate is currently controlled on p.o. Lopressor 25 mg twice daily. Anticoagulation not recommended due to history of fall and anemia. Echo with normal LV function. Continue to monitor on telemetry. He is now comfort care only.  Acute hypoxic respiratory failure likely in the setting of Alan empyema and  pulmonary edema: Not on oxygen supplementation at baseline. Currently requiring at least 2 L to maintain O2 saturation greater than 92%. Continue pulmonary toilet, incentive spirometer, flutter valve, and Small antibiotics. He is now comfort care only.   Acute blood loss anemia/iron deficiency anemia/Anemia of chronic disease 2/2 Alan: The patient has received 1 unit PRBC. Monitor hemoglobin. Multifactorial in the setting of worsening renal function, internal hemorrhoids with positive FOBT, iron deficiency. He has received 1 dose of Feraheme 520 mg once on 06/02/2019. He will be continued on daily ferrous sulfate. Transfuse for hemoglobin less than 7.0. hemoglobin is 8.0 today. He is now comfort care only.   Acute transaminitis: Acute hepatitis panel negative. CT abdomen and pelvis done on 05/18/2019 without contrast: Multiple low-density lesions  again seen within the liver, unchanged, likely cysts. Large exophytic cyst off the Alan. The hepatic lobe is stable. Gallbladder unremarkable. Trend LFTs and continue to avoid hepatotoxins. He is now comfort care only.   Alcohol withdrawal syndrome, resolved: Out of alcohol withdrawal window. No sign of alcohol withdrawal. He is now comfort care only.   Paranoid Alan Small/chronic anxiety: Stable. He is now comfort care only.   Severe physical debility: PT OT assessed and recommended SNF. CSW assisting with placement. Continue PT OT with assistance and fall precautions. Out of bed to chair as tolerated. He is now comfort care only.   I have seen and examined this patient myself. I have spent 30 minutes in his evaluation and care.  DVT prophylaxis:Heparin sq TID Code Status:Full Family Communication:None at bedside Disposition Plan: Discharge to residential hospice when bed arranged for.  Alan Frick, DO [facility] Direct contact: see [email or web address]  7PM-7AM contact night coverage as above 06/08/2019, 5:07 PM  LOS: 11 days

## 2019-06-08 NOTE — Progress Notes (Addendum)
Daily Progress Note   Patient Name: Alan Small       Date: 06/08/2019 DOB: May 01, 1956  Age: 64 y.o. MRN#: [id number] Attending Physician: Alan Kirks, DO Primary Care Physician: Alan Median, MD Admit Date: 05/23/2019  Reason for Consultation/Follow-up: Establishing goals of care  Subjective: Patient is surprisingly more awake/alert today compared to previous assessment. C/o of abdominal pain not relieved by oral medication last night. RN to give IV fentanyl. Updated patient on diagnoses, poor prognosis, and plan for comfort measures/hospice facility. Patient with flat affect during discussion. Explored if there are friends/family he would wish to see before he passes. Patient has a gf Alan Small) but he tells me she is sick too. Allowed opportunity for Alan Small to ask questions. He does not have any questions. Emotional support provided.   GOC:  Received completed MOST form from [facility] via fax. MOST form has been approved by DSS director, Alan Small. VM left for Alan Small to provide update on patient status. Awaiting return call.   Length of Stay: 15  Current Medications: Scheduled Meds:   nicotine  21 mg Transdermal Daily    Continuous Infusions:  sodium chloride     ceftaroline (TEFLARO) 400 mg IVPB 300 mg (06/08/19 0541)   meropenem (MERREM) IV 1 g (06/07/19 2112)    PRN Meds: Place/Maintain arterial line **AND** sodium chloride, acetaminophen **OR** acetaminophen, bisacodyl, fentaNYL (SUBLIMAZE) injection, glycopyrrolate, ipratropium-albuterol, LORazepam, ondansetron (ZOFRAN) IV, oxyCODONE-acetaminophen, polyethylene glycol  Physical Exam Vitals and nursing note reviewed.  Constitutional:      Appearance: He is ill-appearing.  HENT:     Head:  Normocephalic and atraumatic.  Cardiovascular:     Rate and Rhythm: Normal rate.  Pulmonary:     Effort: No tachypnea, accessory muscle usage or respiratory distress.     Breath sounds: Decreased breath sounds present.     Comments: Mild dyspnea at rest Abdominal:     Tenderness: There is no abdominal tenderness.  Skin:    General: Skin is warm and dry.     Coloration: Skin is ashen.     Comments: Anasarca   Neurological:     Mental Status: He is easily aroused.     Comments: Oriented to person/placed. Reoriented to time/situation. Follows commands.   Psychiatric:        Mood and Affect:  Affect is flat.        Speech: Speech is delayed.            Vital Signs: BP (!) 91/58    Pulse 70    Temp 97.8 F (36.6 C) (Oral)    Resp 17    Ht 6' (1.829 m)    Wt 109.7 kg    SpO2 94%    BMI 32.80 kg/m  SpO2: SpO2: 94 % O2 Device: O2 Device: Room Air O2 Flow Rate: O2 Flow Rate (L/min): 1 L/min  Intake/output summary:   Intake/Output Summary (Last 24 hours) at 06/08/2019 5809 Last data filed at 06/07/2019 1810 Gross per 24 hour  Intake 736.83 ml  Output 625 ml  Net 111.83 ml   LBM: Last BM Date: 06/07/19 Baseline Weight: Weight: 90.1 kg Most recent weight: Weight: 109.7 kg       Palliative Assessment/Data: PPS 30%      Patient Active Problem List   Diagnosis Date Noted   Palliative care by specialist    Goals of care, counseling/discussion    Pyomyositis 05/07/2019   Empyema of pleural space ([place]) 05/24/2019   Septic embolism ([place]) 06/01/2019   Epidural abscess 06/02/2019   ETOH abuse 05/14/2019   Atrial flutter ([place])    MRSA bacteremia    Community acquired pneumonia 01/20/2019   Gastroenteritis 01/20/2019   Blood in stool    Change in bowel habits    Constipation    Polyp of sigmoid colon    Depression 02/26/2016   COPD (chronic obstructive pulmonary disease) ([place]) 02/03/2016   Suprapubic catheter ([place]) 12/17/2015   Urethral stricture 12/17/2015     Urinary retention 10/27/2015   Tobacco use disorder 08/27/2015   Malnutrition of moderate degree 05/09/2015   Sepsis ([place]) 05/06/2015   Pyohydronephrosis 01/17/2015   Hypoglycemia 01/17/2015   Gastroesophageal reflux disease 10/28/2014   Bright red rectal bleeding 10/26/2014   Chronic constipation 10/26/2014   Hydronephrosis of left kidney 01/17/2014   Neurogenic bladder 06/21/2013   Urolithiasis 03/15/2013   Nephrolithiasis 98/33/8250   Metabolic acidosis 53/97/6734   Acute renal failure superimposed on chronic kidney disease ([place]) 11/03/2012   Hyperkalemia 11/03/2012   Pyelonephritis 11/03/2012   Schizophrenia ([place]) 11/03/2012   Hypothyroidism 11/03/2012   Severe sepsis ([place]) 11/03/2012   Protein-calorie malnutrition, severe ([place]) 11/03/2012   Encephalopathy acute 11/03/2012   Chronic kidney disease, stage IV (severe) ([place]) 07/02/2012   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 male  with past medical history of solitary left kidney, CKD stage IV, left staghorn calculus s/p stenting, urethral stricture s/p urethroplasty, schizophrenia, COPD, depression. Patient admitted to [facility] between February 10, 2019-March 02, 2019. Patient treated for pyelonephritis and retroperitoneal fluid collection concerning for abscess on 9/17 with JP drain placement. PICC placed and prolonged course of antibiotics. Current hospitalization due to severe sepsis with septic shock secondary to disseminated MRSA, pyomyositis s/p right flank drain, left sided empyema s/p thoracentesis/VATS, epidural abscess being conservatively managed, worsening nonoliguric AKI on CKD4 with solitary left kidney, afib/flutter, acute hypoxic respiratory failure, acute blood loss anemia with underlying paranoid schizophrenia/chronic anxiety. Urine culture with klebsiella pneumonia. Blood cultures have grown out MRSA. F/u  blood cultures 05/25/19 show no growth. Patient will require 6 weeks of IV Teflaro and ID follow-up. Patient with worsening kidney function and worsening urine output. Per nephrology, patient is a poor dialysis candidate given hospital admissions and  disseminated MRSA. Palliative medicine consultation for goals of care.   Assessment: Severe sepsis Disseminated MRSA infection Klebsiella pneumoniae UTI Left empyema s/p VATS Pyomyositis  Worsening nonoliguric AKI on CKD stage 4 Solitary left kidney Anasarca Refractory hyperkalemia Obstructing/infected renal calculus AFib/flutter Acute hypoxic respiratory failure Acute transaminitis Hx of schizophrenia  Recommendations/Plan:  Code status changed to DNR. Patient has documented durable DNR from September 2017. Confirmed with DSS legal guardian.   Patient with worsening renal function, urine output, and uremia. Appreciative nephrology recommendations. Patient is a poor candidate for hemodialysis given recent admissions, disseminated MRSA, solitary kidney, advanced CKD at baseline. Nephrology does NOT recommend trial of hemodialysis.   Recommend transition to comfort measures and initiation of hospice services. Patient is eligible for hospice facility placement with prognosis of weeks or less.  MOST form recommendation: DNR/DNI, comfort focused care and hospice facility placement, discontinuation of IVF/ABX if transferred to hospice facility, and NO feeding tube. Received completed MOST form from [place] guardian for approval with transition to comfort measures only and hospice facility placement. TOC notified.   Comfort meds added to [facility]. Discontinued interventions not aimed at comfort.  Comfort feeds per patient request.  Unrestricted visitor access.  Code Status: DNR   Code Status Orders  (From admission, onward)         Start     Ordered   05/14/2019 2245  Full code  Continuous     05/09/2019 2247        Code Status History     Date Active Date Inactive Code Status Order ID Comments User Context   05/11/2019 1614 05/16/2019 2155 Full Code [id number]  Tyler Pita, MD Inpatient   01/20/2019 1332 01/24/2019 1834 Full Code [id number]  Vaughan Basta, MD ED   08/20/2018 1729 08/22/2018 [place] Full Code [id number]  Fritzi Mandes, MD Inpatient   02/26/2016 1748 02/28/2016 2109 Full Code [id number]  Henreitta Leber, MD Inpatient   05/06/2015 1856 05/15/2015 0115 Full Code [id number]  Henreitta Leber, MD Inpatient   01/17/2015 1206 01/22/2015 1645 Full Code [id number]  Hillary Bow, MD ED   11/03/2012 0300 11/11/2012 2134 Full Code [id number]  Rise Patience, MD Inpatient   Advance Care Planning Activity       Prognosis:   Poor prognosis likely 2 weeks if not days with worsening AKI with underlying CKD stage IV and not a candidate for dialysis, poor urine output, uremia, and disseminated MRSA. Severe decline in functional, cognitive, nutritional status.   Discharge Planning:  Hospice facility or hospital death if patient declines quickly.    Care plan was discussed with patient, RN, Dr. Benny Lennert, Dr. Johnney Ou, VM left for DSS legal guardian Elaina Hoops).  Thank you for allowing the Palliative Medicine Team to assist in the care of this patient.   Time In: 0920- Time Out: 0955 Total Time 35 Prolonged Time Billed no      Greater than 50%  of this time was spent counseling and coordinating care related to the above assessment and plan.  Ihor Dow, DNP, FNP-C Palliative Medicine Team  Phone: [phone] Fax: [phone]  Please contact Palliative Medicine Team phone at [phone] for questions and concerns.

## 2019-06-09 DIAGNOSIS — N179 Acute kidney failure, unspecified: Secondary | ICD-10-CM

## 2019-06-09 DIAGNOSIS — F209 Schizophrenia, unspecified: Secondary | ICD-10-CM

## 2019-06-09 DIAGNOSIS — N189 Chronic kidney disease, unspecified: Secondary | ICD-10-CM

## 2019-06-09 DIAGNOSIS — Z515 Encounter for palliative care: Secondary | ICD-10-CM

## 2019-06-09 MED ORDER — HYDROMORPHONE HCL 1 MG/ML IJ SOLN
0.5000 mg | Freq: Once | INTRAMUSCULAR | Status: AC
  Administered 2019-06-09: 0.5 mg via INTRAVENOUS
  Filled 2019-06-09: qty 1

## 2019-06-09 MED ORDER — OXYCODONE HCL 5 MG PO TABS
5.0000 mg | ORAL_TABLET | ORAL | Status: DC | PRN
  Administered 2019-06-09 – 2019-06-12 (×3): 5 mg via ORAL
  Filled 2019-06-09 (×3): qty 1

## 2019-06-09 MED ORDER — DIPHENHYDRAMINE HCL 50 MG/ML IJ SOLN: 12.5000 mg | Freq: Four times a day (QID) | INTRAMUSCULAR | Status: DC | PRN

## 2019-06-09 MED ORDER — SODIUM CHLORIDE 0.9% FLUSH: 10.0000 mL | INTRAVENOUS | Status: DC | PRN

## 2019-06-09 MED ORDER — SODIUM CHLORIDE 0.9% FLUSH
10.0000 mL | Freq: Two times a day (BID) | INTRAVENOUS | Status: DC
  Administered 2019-06-09 – 2019-06-11 (×6): 10 mL

## 2019-06-09 MED ORDER — LORAZEPAM 0.5 MG PO TABS
0.5000 mg | ORAL_TABLET | Freq: Two times a day (BID) | ORAL | Status: DC
  Administered 2019-06-09 – 2019-06-11 (×6): 0.5 mg via ORAL
  Filled 2019-06-09 (×6): qty 1

## 2019-06-09 NOTE — Progress Notes (Signed)
PROGRESS NOTE  JUSTINN Small ERD:[id number] DOB: January 18, 1956 DOA: 05/30/2019 PCP: [name], MD  Brief History   Alan Small a 64 y.o.malewith medical history significant ofsolitary left kidney, CKD stage IV, left staghorn calculus SP stenting, urethral stricture SP urethroplasty, schizophrenia, clinically diagnosed COPD, depression.  Patient was admitted at [facility] between February 10, 2019 and March 02, 2019. Patient was treated for pyelonephritis and retroperitoneal fluid collection concerning for abscess. Urine culture grew MDR E. coli. Patient underwent aspiration of the abscess on 9/17 with JP drain placement. Patient was started on IV imipenem with a PICC line. Summary ofhisactive problems in the hospital is as following.Underwent left percutaneous nephrolithotomy 03/28/2019 and ureteral stent placement at the same time. Underwent laser lithotripsy and basket extraction on 04/05/2019. Patient was recommended to continue the antibiotics until April 16, 2019.  Patient presented to [facility] on 05/11/2019 with shortness of breath left leg pain and fever and nausea and vomiting. CT scan showed evidence of stone on the left side. Patient was initially placed on the BiPAP. His oxygenation worsened and patient actually required intubation in the emergency department. Urology was consulted and patient underwent urgent cystoscopy and left ureteral stent placement. Patient was started on broad-spectrum antibiotics and his blood cultures came back positive for MRSA. Patient was extubated on 05/20/2019 and since then has remained on nasal cannula. Patient's PICC line was removed on 05/12/2019 and a central line was placed which was removed on 05/18/2019. Foley catheter was inserted on 05/11/2019 after the urology procedure and removed on 05/18/2019. TEE performed on that day as well demonstrated an EF of 56-31% normal systolic function of the right ventricle. There were no  regional wall motion abnormalities and diastolic dysfunction could not be evaluated. It showed no evidence of endocarditis but the patient does have evidence of septic emboli with cavitary lesions on bilateral lung parenchyma therefore patient is treated as infective endocarditis. On 05/19/2019 left thoracentesis/pleural fluid culture came back positive for MRSA. Report showed loculated effusion post procedure. 05/20/2019 had pyomyositis underwent right-sided drain placement after incision and debridement. 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 [facility] neurosurgery was consulted who recommended conservative management. Dr. [name] was on call. Due to worsening shortness of breath CT chest performed on 05/22/2019 showed evidence of recollection of pleural fluid. Chest tube was inserted by PCCM on 05/22/2019. VATS was performed on 12/23. This was well tolerated well. The patient is on Ceftaroline now. Merrem added to cover klebsiella grown from urine culture. Clinically improving.  Hemoglobin 12/29 8.5 from 6.7 post 1 unit PRBC transfusion 12/28.    Nephrology has recommended against hemodialysis and for palliative care and hospice. Palliative care has been able to reach guardian and has made a recommendation for comfort care and discharge to hospice. The guardian has agreed to comfort care measures and discharge to a residential hospice facility. He is now on comfort care measures.  Consultants  . Nephrology . Infectious Disease . General Surgery . Urology  Antibiotics   Anti-infectives (From admission, onward)   Start     Dose/Rate Route Frequency Ordered Stop   06/05/19 1245  meropenem (MERREM) 1 g in sodium chloride 0.9 % 100 mL IVPB  Status:  Discontinued     1 g 200 mL/hr over 30 Minutes Intravenous Every 12 hours 06/05/19 1237 06/08/19 1907   06/01/19 1400   ceftaroline (TEFLARO)  300 mg in sodium chloride 0.9 % 250 mL IVPB  Status:  Discontinued     300 mg 125 mL/hr over 120 Minutes Intravenous Every 8 hours 06/01/19 0818 06/08/19 1907   05/28/19 1400  ceftaroline (TEFLARO) 400 mg in sodium chloride 0.9 % 250 mL IVPB  Status:  Discontinued     400 mg 125 mL/hr over 120 Minutes Intravenous Every 8 hours 05/28/19 1009 06/01/19 0819   05/26/19 1400  ceftaroline (TEFLARO) 300 mg in sodium chloride 0.9 % 250 mL IVPB  Status:  Discontinued     300 mg 125 mL/hr over 120 Minutes Intravenous Every 8 hours 05/26/19 0731 05/28/19 1009   05/29/2019 2200  ceftaroline (TEFLARO) 400 mg in sodium chloride 0.9 % 250 mL IVPB  Status:  Discontinued     400 mg 250 mL/hr over 60 Minutes Intravenous Every 8 hours 05/12/2019 1050 05/26/19 0731   05/27/2019 1342  ceFAZolin (ANCEF) IVPB 2g/100 mL premix     2 g 200 mL/hr over 30 Minutes Intravenous 30 min pre-op 05/24/2019 1342 05/26/2019 1454   05/04/2019 2245  ceftaroline (TEFLARO) 600 mg in sodium chloride 0.9 % 250 mL IVPB  Status:  Discontinued     600 mg 250 mL/hr over 60 Minutes Intravenous Every 12 hours 05/23/2019 2231 05/17/2019 2239   06/02/2019 2245  linezolid (ZYVOX) IVPB 600 mg  Status:  Discontinued     600 mg 300 mL/hr over 60 Minutes Intravenous Every 12 hours 06/02/2019 2231 05/11/2019 1050   05/27/2019 2245  ceftaroline (TEFLARO) 400 mg in sodium chloride 0.9 % 250 mL IVPB  Status:  Discontinued     400 mg 250 mL/hr over 60 Minutes Intravenous Every 12 hours 06/01/2019 2239 05/25/19 1050     Subjective  The patient is resting comfortably in bed. No new complaints.  Objective   Vitals:  Vitals:   06/09/19 0530 06/09/19 1057  BP: (!) 92/45 (!) 93/39  Pulse: 79 71  Resp:    Temp:  (!) 97.4 F (36.3 C)  SpO2: 98% 99%    Exam:  Constitutional:  The patient is awake, alert, and oriented x 3. No acute distress. He appears chronically and acutely ill. Respiratory:  No increased work of breathing. No wheezes,  rales, or rhonchi No tactile fremitus Cardiovascular:  Regular rate and rhythm No murmurs, ectopy, or gallups. No lateral PMI. No thrills. Abdomen:  Abdomen is soft, non-tender, non-distended No hernias, masses, or organomegaly Normoactive bowel sounds.  Musculoskeletal:  No cyanosis, clubbing, or edema Skin:  No rashes, lesions, ulcers palpation of skin: no induration or nodules Neurologic:  CN 2-12 intact Sensation all 4 extremities intact Psychiatric:  Mental status Mood, affect appropriate Orientation to person, place, time  judgment and insight appear intact  I have personally reviewed the following:   Today's Data  . Orthoptist  . Urine culture has grown out more than 100K of klebsiella pneumoniae. It has been shown to be sensitive to imipenem.  Scheduled Meds: . LORazepam  0.5 mg Oral BID  . nicotine  21 mg Transdermal Daily  . sodium chloride flush  10-40 mL Intracatheter Q12H   Continuous Infusions:   Principal Problem:   Empyema of pleural space (HCC) Active Problems:   Acute renal failure superimposed on chronic kidney disease (HCC)   Schizophrenia (HCC)   Hypothyroidism   Severe sepsis (HCC)   Pyohydronephrosis   Kidney stone   Chronic kidney disease, stage IV (severe) (HCC)   COPD (chronic obstructive  pulmonary disease) ([place])   Atrial flutter (HCC)   MRSA bacteremia   Septic embolism (HCC)   Epidural abscess   ETOH abuse   Palliative care by specialist   Terminal care   LOS: 16 days   A & P  Severe sepsis (POA)with septic shock secondary to disseminated MRSA infection thought to have stemmed from a tunneled PICC line that was not maintained or removed following 8-week course of IV antibiotics: Resolved sepsis. Pt also has pyomyositis (POA) for which he has had a right flank drain placed. He also has a left sided empyema s/p thoracentesis/VATS (POA). Chest tube has been placed and now removed. CTS has signed off. There is also an  epidural abscess that is being addressed conservatively as recommended by neurosurgery. Urine culture collected on 06/02/2018 has grown out klebsiella pneumoniae. Pt is still receiving Teflaro. Sensitivities are pending. Urology has also signed off. Blood cultures drawn on admission have grown out MRSA. Follow up blood cultures drawn on 05/04/2019 have had no growth. He will require 6 weeks of IV Teflaro. He will need to follow up with Dr. Delaine Lame as outpatient. Palliative care has been consulted. Palliative care has been able to reach guardian and has made a recommendation for comfort care and discharge to hospice. He is now comfort care only and is awaiting placement in a residential hospice facility.  UTI: Urine culture has grown out more than 100 thousand CFU of klebsiella pneumoniae. The organism has been shown to be sensitive to imipanem. He has been started on meropenem, but this has now been stopped as the patient is on comfort measures awaiting placement in a residential hospice facility.  Anasarca: Diffuse edema with worsening renal function. Holding off IV fluids. Nephrology consulted and following. Comfort measures only now.  Worsening nonoliguric AKI on CKD 4 in the setting of solitary left kidney: Nephrology consulted. Pt is a poor candidate for HD. Palliative care has been consulted. Avoid nephrotoxins and hypotension. Renal ultrasound done on 05/23/2019 showed mild left hydronephrosis/pelviectasis, post right nephrectomy, mild bladder wall thickening. Baseline creatinine appears to be 2.3 with GFR in the 28. Creatinine worsening daily. Creatinine today is 6.26. Rate of increase does seem to be declining, however. Continue to monitor electrolytes, creatinine, and volume status. Nephrology has recommended against hemodialysis and for palliative care and hospice. Palliative care has been able to reach guardian and has made a recommendation for comfort care and discharge to hospice. He is now  comfort care only.  Non anion gap metabolic acidosis likely in the setting of worsening renal function: Improving on p.o. sodium bicarb 1300 mg twice daily. Managed by nephrology. Monitor. He is now comfort care only.  Refractory hyperkalemia in the setting of worsening renal function: Potassium 5.4>> 5.4.  Continue Lokelma 10 g daily as recommended by nephrology. He is now comfort care only.  Obstructing/infected renal calculus, Concurrent VRE UTI, likely POA, S/P emergency ureteral stent placement: Cystoscopy on 05/11/2019 and also was found to have a bulbar urethral stricture. The stricture was dilated. The stent was placed in the left ureter. The urine culture on 05/11/2019 was negative. A repeat urine culture was sent on 05/16/2019 and  positive for VRE. On ceftaroline per ID. Urine culture collected on 06/02/2018 has grown out more than 100 thousand CFU of klebsiella pneumoniae which is sensitive to imipenem. The patient is on Teflaro and cefepime. He is now comfort care only.  A. fib/flutter with rapid ventricular rate, resolved: Rate is currently controlled on p.o. Lopressor  25 mg twice daily. Anticoagulation not recommended due to history of fall and anemia. Echo with normal LV function. Continue to monitor on telemetry. He is now comfort care only.  Acute hypoxic respiratory failure likely in the setting of left empyema and pulmonary edema: Not on oxygen supplementation at baseline. Currently requiring at least 2 L to maintain O2 saturation greater than 92%. Continue pulmonary toilet, incentive spirometer, flutter valve, and IV antibiotics. He is now comfort care only.  Acute blood loss anemia/iron deficiency anemia/Anemia of chronic disease 2/2 CKD: The patient has received 1 unit PRBC. Monitor hemoglobin. Multifactorial in the setting of worsening renal function, internal hemorrhoids with positive FOBT, iron deficiency. He has received 1 dose of Feraheme 520 mg once on 06/02/2019. He will be  continued on daily ferrous sulfate. Transfuse for hemoglobin less than 7.0. hemoglobin is 8.0 today. He is now comfort care only.  Acute transaminitis: Acute hepatitis panel negative. CT abdomen and pelvis done on 05/18/2019 without contrast: Multiple low-density lesions again seen within the liver, unchanged, likely cysts. Large exophytic cyst off the left. The hepatic lobe is stable. Gallbladder unremarkable. Trend LFTs and continue to avoid hepatotoxins. He is now comfort care only.  Alcohol withdrawal syndrome, resolved: Out of alcohol withdrawal window. No sign of alcohol withdrawal. He is now comfort care only.  Paranoid schizophrenia/chronic anxiety: Stable. He is now comfort care only.  Severe physical debility: PT OT assessed and recommended SNF. CSW assisting with placement. Continue PT OT with assistance and fall precautions. Out of bed to chair as tolerated. He is now comfort care only.  I have seen and examined this patient myself. I have spent 30 minutes in his evaluation and care.  DVT prophylaxis:Heparin sq TID Code Status:Full Family Communication:None at bedside Disposition Plan: Discharge to residential hospice when bed arranged for.  Floride Hutmacher, DO [facility] Direct contact: see [email or web address]  7PM-7AM contact night coverage as above 06/09/2019, 4:54 PM  LOS: 11 days

## 2019-06-09 NOTE — Progress Notes (Signed)
Daily Progress Note   Patient Name: Alan Small       Date: 06/09/2019 DOB: Dec 12, 1955  Age: 64 y.o. MRN#: [id number] Attending Physician: Karie Kirks, DO Primary Care Physician: [name], MD Admit Date: 05/09/2019  Reason for Consultation/Follow-up: Establishing goals of care  Subjective/GOC: Patient wakes to voice. He is grimacing and moaning. He complains of pain poorly managed by fentanyl IV. He reports eating bites for breakfast.   Discussed medications with [facility]. He has hx of intolerance to dilaudid (pruritus). Frances does recall becoming itchy from morphine in the past but is willing to try (with benadryl on board) if this would better manage his pain. Also, patient complains of anxiety and would take something for anxiety if given. Answered questions.  Discussed in detail with RN. Patient lost IV access this morning. RN to give PO oxycodone and scheduled ativan. IV team to attempt for another IV, with my concerns that his symptoms will worsen requiring IV symptom management medications.     Length of Stay: 16  Current Medications: Scheduled Meds:  .  HYDROmorphone (DILAUDID) injection  0.5 mg Intravenous Once  . nicotine  21 mg Transdermal Daily    Continuous Infusions:   PRN Meds: acetaminophen **OR** acetaminophen, bisacodyl, diphenhydrAMINE, fentaNYL (SUBLIMAZE) injection, glycopyrrolate, ipratropium-albuterol, LORazepam, ondansetron (ZOFRAN) IV, oxyCODONE, polyethylene glycol  Physical Exam Vitals and nursing note reviewed.  Constitutional:      Appearance: He is ill-appearing.  HENT:     Head: Normocephalic and atraumatic.  Cardiovascular:     Rate and Rhythm: Regular rhythm.  Pulmonary:     Effort: No tachypnea, accessory muscle usage or respiratory  distress.     Breath sounds: Decreased breath sounds present.     Comments: Mild dyspnea at rest Abdominal:     Tenderness: There is no abdominal tenderness.  Skin:    General: Skin is warm and dry.     Coloration: Skin is ashen.     Comments: Anasarca   Neurological:     Mental Status: He is easily aroused.     Comments: Oriented to person/placed. Reoriented to time/situation. Follows commands.   Psychiatric:        Mood and Affect: Affect is flat.        Speech: Speech is delayed.  Vital Signs: BP (!) 93/39 (BP Location: Right Wrist)   Pulse 71   Temp (!) 97.4 F (36.3 C) (Axillary)   Resp 17   Ht 6' (1.829 m)   Wt 109.7 kg   SpO2 99%   BMI 32.80 kg/m  SpO2: SpO2: 99 % O2 Device: O2 Device: Room Air O2 Flow Rate: O2 Flow Rate (L/min): 1 L/min  Intake/output summary:   Intake/Output Summary (Last 24 hours) at 06/09/2019 1155 Last data filed at 06/08/2019 1716 Gross per 24 hour  Intake --  Output 440 ml  Net -440 ml   LBM: Last BM Date: 06/08/19 Baseline Weight: Weight: 90.1 kg Most recent weight: Weight: 109.7 kg       Palliative Assessment/Data: PPS 30%      Patient Active Problem List   Diagnosis Date Noted  . Palliative care by specialist   . Terminal care   . Pyomyositis 05/04/2019  . Empyema of pleural space ([place]) 05/23/2019  . Septic embolism ([place]) 05/09/2019  . Epidural abscess 05/07/2019  . ETOH abuse 05/06/2019  . Atrial flutter ([place])   . MRSA bacteremia   . Community acquired pneumonia 01/20/2019  . Gastroenteritis 01/20/2019  . Blood in stool   . Change in bowel habits   . Constipation   . Polyp of sigmoid colon   . Depression 02/26/2016  . COPD (chronic obstructive pulmonary disease) ([place]) 02/03/2016  . Suprapubic catheter ([place]) 12/17/2015  . Urethral stricture 12/17/2015  . Urinary retention 10/27/2015  . Tobacco use disorder 08/27/2015  . Malnutrition of moderate degree 05/09/2015  . Sepsis ([place]) 05/06/2015  .  Pyohydronephrosis 01/17/2015  . Hypoglycemia 01/17/2015  . Gastroesophageal reflux disease 10/28/2014  . Bright red rectal bleeding 10/26/2014  . Chronic constipation 10/26/2014  . Hydronephrosis of left kidney 01/17/2014  . Neurogenic bladder 06/21/2013  . Urolithiasis 03/15/2013  . Nephrolithiasis 03/04/2013  . Metabolic acidosis 64/33/2951  . Acute renal failure superimposed on chronic kidney disease ([place]) 11/03/2012  . Hyperkalemia 11/03/2012  . Pyelonephritis 11/03/2012  . Schizophrenia ([place]) 11/03/2012  . Hypothyroidism 11/03/2012  . Severe sepsis ([place]) 11/03/2012  . Protein-calorie malnutrition, severe ([place]) 11/03/2012  . Encephalopathy acute 11/03/2012  . Chronic kidney disease, stage IV (severe) ([place]) 07/02/2012  . 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 Kidney stone 04/13/2012    Palliative Care Assessment & Plan   Patient Profile: 64 y.o. male  with past medical history of solitary left kidney, CKD stage IV, left staghorn calculus s/p stenting, urethral stricture s/p urethroplasty, schizophrenia, COPD, depression. Patient admitted to [facility] between February 10, 2019-March 02, 2019. Patient treated for pyelonephritis and retroperitoneal fluid collection concerning for abscess on 9/17 with JP drain placement. PICC placed and prolonged course of antibiotics. Current hospitalization due to severe sepsis with septic shock secondary to disseminated MRSA, pyomyositis s/p right flank drain, left sided empyema s/p thoracentesis/VATS, epidural abscess being conservatively managed, worsening nonoliguric AKI on CKD4 with solitary left kidney, afib/flutter, acute hypoxic respiratory failure, acute blood loss anemia with underlying paranoid schizophrenia/chronic anxiety. Urine culture with klebsiella pneumonia. Blood cultures have grown out MRSA. F/u blood cultures 05/14/2019 show no growth. Patient will require 6 weeks of IV Teflaro and ID follow-up. Patient with worsening kidney function and  worsening urine output. Per nephrology, patient is a poor dialysis candidate given hospital admissions and disseminated MRSA. Palliative medicine consultation for goals of care.   Assessment: Severe sepsis Disseminated MRSA infection Klebsiella pneumoniae UTI Left empyema s/p VATS Pyomyositis  Worsening nonoliguric AKI on CKD  stage 4 Solitary left kidney Anasarca Refractory hyperkalemia Obstructing/infected renal calculus AFib/flutter Acute hypoxic respiratory failure Acute transaminitis Hx of schizophrenia  Recommendations/Plan:  Code status changed to DNR. Patient has documented durable DNR from September 2017. Confirmed with DSS legal guardian.   Patient with worsening renal function, urine output, and uremia. Appreciative nephrology recommendations. Patient is a poor candidate for hemodialysis given recent admissions, disseminated MRSA, solitary kidney, advanced CKD at baseline. Nephrology does NOT recommend trial of hemodialysis.   Recommend transition to comfort measures and initiation of hospice services. Patient is eligible for hospice facility placement with prognosis of weeks or less.  MOST form recommendation: DNR/DNI, comfort focused care and hospice facility placement, discontinuation of IVF/ABX if transferred to hospice facility, and NO feeding tube. Received completed MOST form from [place] guardian for approval with transition to comfort measures only and hospice facility placement. TOC notified.   Comfort meds added to [facility]. Discontinued interventions not aimed at comfort.  Comfort feeds per patient request.  Unrestricted visitor access.  Code Status: DNR   Code Status Orders  (From admission, onward)         Start     Ordered   05/19/2019 2245  Full code  Continuous     05/31/2019 2247        Code Status History    Date Active Date Inactive Code Status Order ID Comments User Context   05/11/2019 1614 05/16/2019 2155 Full Code [id number]  Tyler Pita, MD  Inpatient   01/20/2019 1332 01/24/2019 1834 Full Code [id number]  Vaughan Basta, MD ED   08/20/2018 1729 08/22/2018 [place] Full Code [id number]  Fritzi Mandes, MD Inpatient   02/26/2016 1748 02/28/2016 2109 Full Code [id number]  Henreitta Leber, MD Inpatient   05/06/2015 1856 05/15/2015 0115 Full Code [id number]  Henreitta Leber, MD Inpatient   01/17/2015 1206 01/22/2015 1645 Full Code [id number]  Hillary Bow, MD ED   11/03/2012 0300 11/11/2012 2134 Full Code [id number]  Rise Patience, MD Inpatient   Advance Care Planning Activity       Prognosis:   Poor prognosis likely 2 weeks if not days with worsening AKI with underlying CKD stage IV and not a candidate for dialysis, poor urine output, uremia, and disseminated MRSA. Severe decline in functional, cognitive, nutritional status.   Discharge Planning:  Hospice facility or hospital death if patient declines quickly.    Care plan was discussed with patient, RN  Thank you for allowing the Palliative Medicine Team to assist in the care of this patient.   Time In: 1120 Time Out: 1150 Total Time 30 Prolonged Time Billed no      Greater than 50%  of this time was spent counseling and coordinating care related to the above assessment and plan.  Ihor Dow, DNP, FNP-C Palliative Medicine Team  Phone: [phone] Fax: [phone]  Please contact Palliative Medicine Team phone at [phone] for questions and concerns.

## 2019-06-10 DIAGNOSIS — F411 Generalized anxiety disorder: Secondary | ICD-10-CM

## 2019-06-10 DIAGNOSIS — R52 Pain, unspecified: Secondary | ICD-10-CM

## 2019-06-10 MED ORDER — HYDROMORPHONE HCL 1 MG/ML IJ SOLN
0.5000 mg | INTRAMUSCULAR | Status: DC | PRN
  Administered 2019-06-10 – 2019-06-11 (×3): 0.5 mg via INTRAVENOUS
  Filled 2019-06-10 (×3): qty 1

## 2019-06-10 NOTE — Progress Notes (Signed)
Manufacturing engineer  Received referral for residential hospice at [facility] in [place].  There are no beds to offer today.  Left message for his legal guardian--they have already consented to his transition--just to update that we are working on it but no bed today.  ACC will follow up with [facility] manager when bed availability status changes.  Venia Carbon RN, BSN, [place] [facility] Liaison (in [place]) [phone]

## 2019-06-10 NOTE — TOC Progression Note (Signed)
Transition of Care [facility]) - Progression Note    Patient Details  Name: Alan Small MRN: [id number] Date of Birth: Feb 02, 1956  Transition of Care [facility]) CM/SW [place], [place] Phone Number: 06/10/2019, 10:09 AM  Clinical Narrative:     CSW made referral to [place]. Intake will inform the admitting RN and contact CSW for assessment.   Thurmond Butts, MSW, [place] Clinical Social Worker   Expected Discharge Plan: Skilled Nursing Facility Barriers to Discharge: SNF Pending bed offer, Continued Medical Work up, [place] (PASRR)  Expected Discharge Plan and Services Expected Discharge Plan: [place] In-house Referral: Clinical Social Work     Living arrangements for the past 2 months: Group Home([facility])                                       Social Determinants of Health (SDOH) Interventions    Readmission Risk Interventions Readmission Risk Prevention Plan 05/13/2019  Transportation Screening Complete  Palliative Care Screening Not Applicable  Medication Review (RN Care Manager) Complete  Some recent data might be hidden

## 2019-06-10 NOTE — Progress Notes (Signed)
PROGRESS NOTE  NAKSH RADI WSF:[id number] DOB: June 06, 1955 DOA: 05/12/2019 PCP: [name], MD  Brief History   [name] a 64 y.o.malewith medical history significant ofsolitary left kidney, CKD stage IV, left staghorn calculus SP stenting, urethral stricture SP urethroplasty, schizophrenia, clinically diagnosed COPD, depression.  Patient was admitted at [facility] between February 10, 2019 and March 02, 2019. Patient was treated for pyelonephritis and retroperitoneal fluid collection concerning for abscess. Urine culture grew MDR E. coli. Patient underwent aspiration of the abscess on 9/17 with JP drain placement. Patient was started on IV imipenem with a PICC line. Summary ofhisactive problems in the hospital is as following.Underwent left percutaneous nephrolithotomy 03/28/2019 and ureteral stent placement at the same time. Underwent laser lithotripsy and basket extraction on 04/05/2019. Patient was recommended to continue the antibiotics until April 16, 2019.  Patient presented to [facility] on 05/11/2019 with shortness of breath left leg pain and fever and nausea and vomiting. CT scan showed evidence of stone on the left side. Patient was initially placed on the BiPAP. His oxygenation worsened and patient actually required intubation in the emergency department. Urology was consulted and patient underwent urgent cystoscopy and left ureteral stent placement. Patient was started on broad-spectrum antibiotics and his blood cultures came back positive for MRSA. Patient was extubated on 05/20/2019 and since then has remained on nasal cannula. Patient's PICC line was removed on 05/12/2019 and a central line was placed which was removed on 05/18/2019. Foley catheter was inserted on 05/11/2019 after the urology procedure and removed on 05/18/2019. TEE performed on that day as well demonstrated an EF of 01-74% normal systolic function of the right ventricle. There were no  regional wall motion abnormalities and diastolic dysfunction could not be evaluated. It showed no evidence of endocarditis but the patient does have evidence of septic emboli with cavitary lesions on bilateral lung parenchyma therefore patient is treated as infective endocarditis. On 05/19/2019 left thoracentesis/pleural fluid culture came back positive for MRSA. Report showed loculated effusion post procedure. 05/20/2019 had pyomyositis underwent right-sided drain placement after incision and debridement. 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 [facility] neurosurgery was consulted who recommended conservative management. Dr. [name] was on call. Due to worsening shortness of breath CT chest performed on 05/22/2019 showed evidence of recollection of pleural fluid. Chest tube was inserted by PCCM on 05/22/2019. VATS was performed on 12/23. This was well tolerated well. The patient is on Ceftaroline now. Merrem added to cover klebsiella grown from urine culture. Clinically improving.  Hemoglobin 12/29 8.5 from 6.7 post 1 unit PRBC transfusion 12/28.    Nephrology has recommended against hemodialysis and for palliative care and hospice. Palliative care has been able to reach guardian and has made a recommendation for comfort care and discharge to hospice. The guardian has agreed to comfort care measures and discharge to a residential hospice facility. He is now on comfort care measures and awaiting a bed in a residential hospice facility.  Consultants  . Nephrology . Infectious Disease . General Surgery . Urology  Antibiotics   Anti-infectives (From admission, onward)   Start     Dose/Rate Route Frequency Ordered Stop   06/05/19 1245  meropenem (MERREM) 1 g in sodium chloride 0.9 % 100 mL IVPB  Status:  Discontinued     1 g 200 mL/hr over 30 Minutes Intravenous Every 12 hours  06/05/19 1237  06/08/19 1907   06/01/19 1400  ceftaroline (TEFLARO) 300 mg in sodium chloride 0.9 % 250 mL IVPB  Status:  Discontinued     300 mg 125 mL/hr over 120 Minutes Intravenous Every 8 hours 06/01/19 0818 06/08/19 1907   05/28/19 1400  ceftaroline (TEFLARO) 400 mg in sodium chloride 0.9 % 250 mL IVPB  Status:  Discontinued     400 mg 125 mL/hr over 120 Minutes Intravenous Every 8 hours 05/28/19 1009 06/01/19 0819   05/26/19 1400  ceftaroline (TEFLARO) 300 mg in sodium chloride 0.9 % 250 mL IVPB  Status:  Discontinued     300 mg 125 mL/hr over 120 Minutes Intravenous Every 8 hours 05/26/19 0731 05/28/19 1009   05/17/2019 2200  ceftaroline (TEFLARO) 400 mg in sodium chloride 0.9 % 250 mL IVPB  Status:  Discontinued     400 mg 250 mL/hr over 60 Minutes Intravenous Every 8 hours 05/15/2019 1050 05/26/19 0731   05/17/2019 1342  ceFAZolin (ANCEF) IVPB 2g/100 mL premix     2 g 200 mL/hr over 30 Minutes Intravenous 30 min pre-op 05/19/2019 1342 05/13/2019 1454   05/06/2019 2245  ceftaroline (TEFLARO) 600 mg in sodium chloride 0.9 % 250 mL IVPB  Status:  Discontinued     600 mg 250 mL/hr over 60 Minutes Intravenous Every 12 hours 05/10/2019 2231 05/08/2019 2239   05/10/2019 2245  linezolid (ZYVOX) IVPB 600 mg  Status:  Discontinued     600 mg 300 mL/hr over 60 Minutes Intravenous Every 12 hours 05/04/2019 2231 05/27/2019 1050   05/04/2019 2245  ceftaroline (TEFLARO) 400 mg in sodium chloride 0.9 % 250 mL IVPB  Status:  Discontinued     400 mg 250 mL/hr over 60 Minutes Intravenous Every 12 hours 05/03/2019 2239 05/22/2019 1050     Subjective  The patient is resting comfortably in bed. No new complaints.  Objective   Vitals:  Vitals:   06/09/19 1057 06/09/19 2106  BP: (!) 93/39 (!) 82/46  Pulse: 71 77  Resp:  17  Temp: (!) 97.4 F (36.3 C)   SpO2: 99% 96%    Exam:  Constitutional:  The patient is awake, alert, and oriented x 3. No acute distress. He appears chronically and acutely ill. Respiratory:    No increased work of breathing. No wheezes, rales, or rhonchi No tactile fremitus Cardiovascular:  Regular rate and rhythm No murmurs, ectopy, or gallups. No lateral PMI. No thrills. Abdomen:  Abdomen is soft, non-tender, non-distended No hernias, masses, or organomegaly Normoactive bowel sounds.  Musculoskeletal:  No cyanosis, clubbing, or edema Skin:  No rashes, lesions, ulcers palpation of skin: no induration or nodules Neurologic:  CN 2-12 intact Sensation all 4 extremities intact Psychiatric:  Mental status Mood, affect appropriate Orientation to person, place, time  judgment and insight appear intact  I have personally reviewed the following:   Today's Data  . Orthoptist  . Urine culture has grown out more than 100K of klebsiella pneumoniae. It has been shown to be sensitive to imipenem.  Scheduled Meds: . LORazepam  0.5 mg Oral BID  . nicotine  21 mg Transdermal Daily  . sodium chloride flush  10-40 mL Intracatheter Q12H   Continuous Infusions:   Principal Problem:   Empyema of pleural space (HCC) Active Problems:   Acute renal failure superimposed on chronic kidney disease (HCC)   Schizophrenia (HCC)   Hypothyroidism   Severe sepsis (HCC)   Pyohydronephrosis   Kidney stone   Chronic kidney  disease, stage IV (severe) (HCC)   COPD (chronic obstructive pulmonary disease) (HCC)   Atrial flutter (HCC)   MRSA bacteremia   Septic embolism (HCC)   Epidural abscess   ETOH abuse   Palliative care by specialist   Terminal care   Generalized pain   Anxiety state   LOS: 17 days   A & P  Severe sepsis (POA)with septic shock secondary to disseminated MRSA infection thought to have stemmed from a tunneled PICC line that was not maintained or removed following 8-week course of IV antibiotics: Resolved sepsis. Pt also has pyomyositis (POA) for which he has had a right flank drain placed. He also has a left sided empyema s/p thoracentesis/VATS (POA).  Chest tube has been placed and now removed. CTS has signed off. There is also an epidural abscess that is being addressed conservatively as recommended by neurosurgery. Urine culture collected on 06/02/2018 has grown out klebsiella pneumoniae. Pt is still receiving Teflaro. Sensitivities are pending. Urology has also signed off. Blood cultures drawn on admission have grown out MRSA. Follow up blood cultures drawn on 05/10/2019 have had no growth. He will require 6 weeks of IV Teflaro. He will need to follow up with Dr. Delaine Lame as outpatient. Palliative care has been consulted. Palliative care has been able to reach guardian and has made a recommendation for comfort care and discharge to hospice. He is now comfort care only and is awaiting placement in a residential hospice facility.  UTI: Urine culture has grown out more than 100 thousand CFU of klebsiella pneumoniae. The organism has been shown to be sensitive to imipanem. He has been started on meropenem, but this has now been stopped as the patient is on comfort measures awaiting placement in a residential hospice facility.  Anasarca: Diffuse edema with worsening renal function. Holding off IV fluids. Nephrology consulted and following. Comfort measures only now.  Worsening nonoliguric AKI on CKD 4 in the setting of solitary left kidney: Nephrology consulted. Pt is a poor candidate for HD. Palliative care has been consulted. Avoid nephrotoxins and hypotension. Renal ultrasound done on 05/23/2019 showed mild left hydronephrosis/pelviectasis, post right nephrectomy, mild bladder wall thickening. Baseline creatinine appears to be 2.3 with GFR in the 28. Creatinine worsening daily. Creatinine today is 6.26. Rate of increase does seem to be declining, however. Continue to monitor electrolytes, creatinine, and volume status. Nephrology has recommended against hemodialysis and for palliative care and hospice. Palliative care has been able to reach guardian and  has made a recommendation for comfort care and discharge to hospice. He is now comfort care only.  Non anion gap metabolic acidosis likely in the setting of worsening renal function: Improving on p.o. sodium bicarb 1300 mg twice daily. Managed by nephrology. Monitor. He is now comfort care only.  Refractory hyperkalemia in the setting of worsening renal function: Potassium 5.4>> 5.4.  Continue Lokelma 10 g daily as recommended by nephrology. He is now comfort care only.  Obstructing/infected renal calculus, Concurrent VRE UTI, likely POA, S/P emergency ureteral stent placement: Cystoscopy on 05/11/2019 and also was found to have a bulbar urethral stricture. The stricture was dilated. The stent was placed in the left ureter. The urine culture on 05/11/2019 was negative. A repeat urine culture was sent on 05/16/2019 and  positive for VRE. On ceftaroline per ID. Urine culture collected on 06/02/2018 has grown out more than 100 thousand CFU of klebsiella pneumoniae which is sensitive to imipenem. The patient is on Teflaro and cefepime. He is now  comfort care only.  A. fib/flutter with rapid ventricular rate, resolved: Rate is currently controlled on p.o. Lopressor 25 mg twice daily. Anticoagulation not recommended due to history of fall and anemia. Echo with normal LV function. Continue to monitor on telemetry. He is now comfort care only.  Acute hypoxic respiratory failure likely in the setting of left empyema and pulmonary edema: Not on oxygen supplementation at baseline. Currently requiring at least 2 L to maintain O2 saturation greater than 92%. Continue pulmonary toilet, incentive spirometer, flutter valve, and IV antibiotics. He is now comfort care only.  Acute blood loss anemia/iron deficiency anemia/Anemia of chronic disease 2/2 CKD: The patient has received 1 unit PRBC. Monitor hemoglobin. Multifactorial in the setting of worsening renal function, internal hemorrhoids with positive FOBT, iron  deficiency. He has received 1 dose of Feraheme 520 mg once on 06/02/2019. He will be continued on daily ferrous sulfate. Transfuse for hemoglobin less than 7.0. hemoglobin is 8.0 today. He is now comfort care only.  Acute transaminitis: Acute hepatitis panel negative. CT abdomen and pelvis done on 05/18/2019 without contrast: Multiple low-density lesions again seen within the liver, unchanged, likely cysts. Large exophytic cyst off the left. The hepatic lobe is stable. Gallbladder unremarkable. Trend LFTs and continue to avoid hepatotoxins. He is now comfort care only.  Alcohol withdrawal syndrome, resolved: Out of alcohol withdrawal window. No sign of alcohol withdrawal. He is now comfort care only.  Paranoid schizophrenia/chronic anxiety: Stable. He is now comfort care only.  Severe physical debility: PT OT assessed and recommended SNF. CSW assisting with placement. Continue PT OT with assistance and fall precautions. Out of bed to chair as tolerated. He is now comfort care only.  I have seen and examined this patient myself. I have spent 30 minutes in his evaluation and care.  DVT prophylaxis:Heparin sq TID Code Status:Full Family Communication:None at bedside Disposition Plan: Discharge to residential hospice when bed arranged for.  Eddy Liszewski, DO [facility] Direct contact: see [email or web address]  7PM-7AM contact night coverage as above 06/10/2019, 2:04 PM  LOS: 11 days

## 2019-06-10 NOTE — Progress Notes (Signed)
Daily Progress Note   Patient Name: Alan Small       Date: 06/10/2019 DOB: [birth year]  Age: 64 y.o. MRN#: [id number] Attending Physician: Karie Kirks, DO Primary Care Physician: [name], MD Admit Date: 05/28/2019  Reason for Consultation/Follow-up: Establishing goals of care  Subjective: Patient wakes to voice. RN is about to bring him pain medication and scheduled ativan. Patient reports he did not experience pruritus from dilaudid given yesterday. He reports minimal relief of pain from fentanyl and is agreeable with prn Dilaudid. He is oriented to person/place. Ate some of breakfast.   Length of Stay: 17  Current Medications: Scheduled Meds:  . LORazepam  0.5 mg Oral BID  . nicotine  21 mg Transdermal Daily  . sodium chloride flush  10-40 mL Intracatheter Q12H    Continuous Infusions:   PRN Meds: acetaminophen **OR** acetaminophen, bisacodyl, diphenhydrAMINE, glycopyrrolate, HYDROmorphone (DILAUDID) injection, ipratropium-albuterol, LORazepam, ondansetron (ZOFRAN) IV, oxyCODONE, polyethylene glycol, sodium chloride flush  Physical Exam Vitals and nursing note reviewed.  Constitutional:      Appearance: He is ill-appearing.  HENT:     Head: Normocephalic and atraumatic.  Cardiovascular:     Rate and Rhythm: Regular rhythm.  Pulmonary:     Effort: No tachypnea, accessory muscle usage or respiratory distress.     Breath sounds: Decreased breath sounds present.     Comments: Mild dyspnea at rest Abdominal:     Tenderness: There is no abdominal tenderness.  Skin:    General: Skin is warm and dry.     Coloration: Skin is ashen.     Comments: Anasarca   Neurological:     Mental Status: He is easily aroused.     Comments: Oriented to person/placed. Reoriented  to time/situation. Follows commands.   Psychiatric:        Mood and Affect: Affect is flat.        Speech: Speech is delayed.            Vital Signs: BP (!) 82/46 (BP Location: Left Arm)   Pulse 77   Temp (!) 97.4 F (36.3 C) (Axillary)   Resp 17   Ht 6' (1.829 m)   Wt 109.7 kg   SpO2 96%   BMI 32.80 kg/m  SpO2: SpO2: 96 % O2 Device: O2 Device: Nasal Cannula O2  Flow Rate: O2 Flow Rate (L/min): 2 L/min  Intake/output summary:   Intake/Output Summary (Last 24 hours) at 06/10/2019 1024 Last data filed at 06/10/2019 1000 Gross per 24 hour  Intake 250 ml  Output --  Net 250 ml   LBM: Last BM Date: 06/09/19 Baseline Weight: Weight: 90.1 kg Most recent weight: Weight: 109.7 kg       Palliative Assessment/Data: PPS 30%      Patient Active Problem List   Diagnosis Date Noted  . Palliative care by specialist   . Terminal care   . Pyomyositis 05/22/2019  . Empyema of pleural space ([place]) 05/08/2019  . Septic embolism ([place]) 05/19/2019  . Epidural abscess 05/04/2019  . ETOH abuse 05/10/2019  . Atrial flutter ([place])   . MRSA bacteremia   . Community acquired pneumonia 01/20/2019  . Gastroenteritis 01/20/2019  . Blood in stool   . Change in bowel habits   . Constipation   . Polyp of sigmoid colon   . Depression 02/26/2016  . COPD (chronic obstructive pulmonary disease) ([place]) 02/03/2016  . Suprapubic catheter ([place]) 12/17/2015  . Urethral stricture 12/17/2015  . Urinary retention 10/27/2015  . Tobacco use disorder 08/27/2015  . Malnutrition of moderate degree 05/09/2015  . Sepsis ([place]) 05/06/2015  . Pyohydronephrosis 01/17/2015  . Hypoglycemia 01/17/2015  . Gastroesophageal reflux disease 10/28/2014  . Bright red rectal bleeding 10/26/2014  . Chronic constipation 10/26/2014  . Hydronephrosis of left kidney 01/17/2014  . Neurogenic bladder 06/21/2013  . Urolithiasis 03/15/2013  . Nephrolithiasis 03/04/2013  . Metabolic acidosis 54/62/7035  . Acute renal failure  superimposed on chronic kidney disease ([place]) 11/03/2012  . Hyperkalemia 11/03/2012  . Pyelonephritis 11/03/2012  . Schizophrenia ([place]) 11/03/2012  . Hypothyroidism 11/03/2012  . Severe sepsis ([place]) 11/03/2012  . Protein-calorie malnutrition, severe ([place]) 11/03/2012  . Encephalopathy acute 11/03/2012  . Chronic kidney disease, stage IV (severe) ([place]) 07/02/2012  . 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 Kidney stone 04/13/2012    Palliative Care Assessment & Plan   Patient Profile: 64 y.o. male  with past medical history of solitary left kidney, CKD stage IV, left staghorn calculus s/p stenting, urethral stricture s/p urethroplasty, schizophrenia, COPD, depression. Patient admitted to [facility] between February 10, 2019-March 02, 2019. Patient treated for pyelonephritis and retroperitoneal fluid collection concerning for abscess on 9/17 with JP drain placement. PICC placed and prolonged course of antibiotics. Current hospitalization due to severe sepsis with septic shock secondary to disseminated MRSA, pyomyositis s/p right flank drain, left sided empyema s/p thoracentesis/VATS, epidural abscess being conservatively managed, worsening nonoliguric AKI on CKD4 with solitary left kidney, afib/flutter, acute hypoxic respiratory failure, acute blood loss anemia with underlying paranoid schizophrenia/chronic anxiety. Urine culture with klebsiella pneumonia. Blood cultures have grown out MRSA. F/u blood cultures 05/06/2019 show no growth. Patient will require 6 weeks of IV Teflaro and ID follow-up. Patient with worsening kidney function and worsening urine output. Per nephrology, patient is a poor dialysis candidate given hospital admissions and disseminated MRSA. Palliative medicine consultation for goals of care.   Assessment: Severe sepsis Disseminated MRSA infection Klebsiella pneumoniae UTI Left empyema s/p VATS Pyomyositis  Worsening nonoliguric AKI on CKD stage 4 Solitary left  kidney Anasarca Refractory hyperkalemia Obstructing/infected renal calculus AFib/flutter Acute hypoxic respiratory failure Acute transaminitis Hx of schizophrenia  Recommendations/Plan:  Code status changed to DNR. Patient has documented durable DNR from September 2017. Confirmed with DSS legal guardian.   Patient with worsening renal function, urine output, and uremia. Appreciative nephrology recommendations. Patient is  a poor candidate for hemodialysis given recent admissions, disseminated MRSA, solitary kidney, advanced CKD at baseline. Nephrology does NOT recommend trial of hemodialysis.   Recommend transition to comfort measures and initiation of hospice services. Patient is eligible for hospice facility placement with prognosis of weeks or less.  MOST form recommendation: DNR/DNI, comfort focused care and hospice facility placement, discontinuation of IVF/ABX if transferred to hospice facility, and NO feeding tube. Received completed MOST form from [place] guardian for approval with transition to comfort measures only and hospice facility placement. TOC notified. Waiting on hospice bed.  Symptom management medications  Dilaudid 0.5mg  IV q2h prn pain/dyspnea/air hunger/tachypnea  Ativan 0.5mg  PO BID  Continue prn ativan for breakthrough anxiety  Prn robinul for secretions  Comfort feeds per patient request.  Unrestricted visitor access.  Code Status: DNR   Code Status Orders  (From admission, onward)         Start     Ordered   05/20/2019 2245  Full code  Continuous     05/19/2019 2247        Code Status History    Date Active Date Inactive Code Status Order ID Comments User Context   05/11/2019 1614 05/17/2019 2155 Full Code [id number]  Tyler Pita, MD Inpatient   01/20/2019 1332 01/24/2019 1834 Full Code [id number]  Vaughan Basta, MD ED   08/20/2018 1729 08/22/2018 [employer] Full Code [id number]  Fritzi Mandes, MD Inpatient   02/26/2016 1748 02/28/2016 2109 Full Code  [id number]  Henreitta Leber, MD Inpatient   05/06/2015 1856 05/15/2015 0115 Full Code [id number]  Henreitta Leber, MD Inpatient   01/17/2015 1206 01/22/2015 1645 Full Code [id number]  Hillary Bow, MD ED   11/03/2012 0300 11/11/2012 2134 Full Code [id number]  Rise Patience, MD Inpatient   Advance Care Planning Activity       Prognosis:   Poor prognosis likely 2 weeks if not days with worsening AKI with underlying CKD stage IV and not a candidate for dialysis, poor urine output, uremia, and disseminated MRSA. Severe decline in functional, cognitive, nutritional status.   Discharge Planning:  Hospice facility or hospital death if patient declines quickly.    Care plan was discussed with patient, RN  Thank you for allowing the Palliative Medicine Team to assist in the care of this patient.   Time In: 1005 Time Out: 1025 Total Time 20 Prolonged Time Billed no      Greater than 50%  of this time was spent counseling and coordinating care related to the above assessment and plan.  Ihor Dow, DNP, FNP-C Palliative Medicine Team  Phone: [phone] Fax: [phone]  Please contact Palliative Medicine Team phone at [phone] for questions and concerns.

## 2019-06-11 NOTE — Progress Notes (Signed)
PROGRESS NOTE  LES LONGMORE HUT:[id number] DOB: Apr 15, 1956 DOA: 05/11/2019 PCP: [name], MD  Brief History   [name] a 64 y.o.malewith medical history significant ofsolitary left kidney, CKD stage IV, left staghorn calculus SP stenting, urethral stricture SP urethroplasty, schizophrenia, clinically diagnosed COPD, depression.  Patient was admitted at [facility] between February 10, 2019 and March 02, 2019. Patient was treated for pyelonephritis and retroperitoneal fluid collection concerning for abscess. Urine culture grew MDR E. coli. Patient underwent aspiration of the abscess on 9/17 with JP drain placement. Patient was started on IV imipenem with a PICC line. Summary ofhisactive problems in the hospital is as following.Underwent left percutaneous nephrolithotomy 03/28/2019 and ureteral stent placement at the same time. Underwent laser lithotripsy and basket extraction on 04/05/2019. Patient was recommended to continue the antibiotics until April 16, 2019.  Patient presented to [facility] on 05/11/2019 with shortness of breath left leg pain and fever and nausea and vomiting. CT scan showed evidence of stone on the left side. Patient was initially placed on the BiPAP. His oxygenation worsened and patient actually required intubation in the emergency department. Urology was consulted and patient underwent urgent cystoscopy and left ureteral stent placement. Patient was started on broad-spectrum antibiotics and his blood cultures came back positive for MRSA. Patient was extubated on 05/20/2019 and since then has remained on nasal cannula. Patient's PICC line was removed on 05/12/2019 and a central line was placed which was removed on 05/18/2019. Foley catheter was inserted on 05/11/2019 after the urology procedure and removed on 05/18/2019. TEE performed on that day as well demonstrated an EF of 65-68% normal systolic function of the right ventricle. There were no  regional wall motion abnormalities and diastolic dysfunction could not be evaluated. It showed no evidence of endocarditis but the patient does have evidence of septic emboli with cavitary lesions on bilateral lung parenchyma therefore patient is treated as infective endocarditis. On 05/19/2019 left thoracentesis/pleural fluid culture came back positive for MRSA. Report showed loculated effusion post procedure. 05/20/2019 had pyomyositis underwent right-sided drain placement after incision and debridement. 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 [facility] neurosurgery was consulted who recommended conservative management. Dr. [name] was on call. Due to worsening shortness of breath CT chest performed on 05/22/2019 showed evidence of recollection of pleural fluid. Chest tube was inserted by PCCM on 05/22/2019. VATS was performed on 12/23. This was well tolerated well. The patient is on Ceftaroline now. Merrem added to cover klebsiella grown from urine culture. Clinically improving.  Hemoglobin 12/29 8.5 from 6.7 post 1 unit PRBC transfusion 12/28.    Nephrology has recommended against hemodialysis and for palliative care and hospice. Palliative care has been able to reach guardian and has made a recommendation for comfort care and discharge to hospice. The guardian has agreed to comfort care measures and discharge to a residential hospice facility. He is now on comfort care measures and awaiting a bed in a residential hospice facility.  Consultants  . Nephrology . Infectious Disease . General Surgery . Urology  Antibiotics   Anti-infectives (From admission, onward)   Start     Dose/Rate Route Frequency Ordered Stop   06/05/19 1245  meropenem (MERREM) 1 g in sodium chloride 0.9 % 100 mL IVPB  Status:  Discontinued     1 g 200 mL/hr over 30 Minutes Intravenous Every 12 hours  06/05/19 1237  06/08/19 1907   06/01/19 1400  ceftaroline (TEFLARO) 300 mg in sodium chloride 0.9 % 250 mL IVPB  Status:  Discontinued     300 mg 125 mL/hr over 120 Minutes Intravenous Every 8 hours 06/01/19 0818 06/08/19 1907   05/28/19 1400  ceftaroline (TEFLARO) 400 mg in sodium chloride 0.9 % 250 mL IVPB  Status:  Discontinued     400 mg 125 mL/hr over 120 Minutes Intravenous Every 8 hours 05/28/19 1009 06/01/19 0819   05/26/19 1400  ceftaroline (TEFLARO) 300 mg in sodium chloride 0.9 % 250 mL IVPB  Status:  Discontinued     300 mg 125 mL/hr over 120 Minutes Intravenous Every 8 hours 05/26/19 0731 05/28/19 1009   05/15/2019 2200  ceftaroline (TEFLARO) 400 mg in sodium chloride 0.9 % 250 mL IVPB  Status:  Discontinued     400 mg 250 mL/hr over 60 Minutes Intravenous Every 8 hours 05/13/2019 1050 05/26/19 0731   05/30/2019 1342  ceFAZolin (ANCEF) IVPB 2g/100 mL premix     2 g 200 mL/hr over 30 Minutes Intravenous 30 min pre-op 05/07/2019 1342 05/27/2019 1454   05/22/2019 2245  ceftaroline (TEFLARO) 600 mg in sodium chloride 0.9 % 250 mL IVPB  Status:  Discontinued     600 mg 250 mL/hr over 60 Minutes Intravenous Every 12 hours 05/27/2019 2231 06/02/2019 2239   05/13/2019 2245  linezolid (ZYVOX) IVPB 600 mg  Status:  Discontinued     600 mg 300 mL/hr over 60 Minutes Intravenous Every 12 hours 05/10/2019 2231 05/09/2019 1050   05/07/2019 2245  ceftaroline (TEFLARO) 400 mg in sodium chloride 0.9 % 250 mL IVPB  Status:  Discontinued     400 mg 250 mL/hr over 60 Minutes Intravenous Every 12 hours 05/30/2019 2239 05/05/2019 1050     Subjective  The patient is resting comfortably in bed. No new complaints.  Objective   Vitals:  Vitals:   06/11/19 0550 06/11/19 0915  BP: (!) 97/45 (!) 94/49  Pulse: 84 82  Resp: 16 18  Temp: (!) 97.5 F (36.4 C)   SpO2: 100% 100%    Exam:  Constitutional:  The patient is awake, alert, and oriented x 3. No acute distress. He appears chronically and acutely  ill. Respiratory:  No increased work of breathing. No wheezes, rales, or rhonchi No tactile fremitus Cardiovascular:  Regular rate and rhythm No murmurs, ectopy, or gallups. No lateral PMI. No thrills. Abdomen:  Abdomen is soft, non-tender, non-distended No hernias, masses, or organomegaly Normoactive bowel sounds.  Musculoskeletal:  No cyanosis, clubbing, or edema Skin:  No rashes, lesions, ulcers palpation of skin: no induration or nodules Neurologic:  CN 2-12 intact Sensation all 4 extremities intact Psychiatric:  Mental status Mood, affect appropriate Orientation to person, place, time  judgment and insight appear intact  I have personally reviewed the following:   Today's Data  . Orthoptist  . Urine culture has grown out more than 100K of klebsiella pneumoniae. It has been shown to be sensitive to imipenem.  Scheduled Meds: . LORazepam  0.5 mg Oral BID  . nicotine  21 mg Transdermal Daily  . sodium chloride flush  10-40 mL Intracatheter Q12H   Continuous Infusions:   Principal Problem:   Empyema of pleural space (HCC) Active Problems:   Acute renal failure superimposed on chronic kidney disease (HCC)   Schizophrenia (HCC)   Hypothyroidism   Severe sepsis (HCC)   Pyohydronephrosis   Kidney stone   Chronic kidney disease,  stage IV (severe) (HCC)   COPD (chronic obstructive pulmonary disease) (HCC)   Atrial flutter (HCC)   MRSA bacteremia   Septic embolism (HCC)   Epidural abscess   ETOH abuse   Palliative care by specialist   Terminal care   Generalized pain   Anxiety state   LOS: 18 days   A & P  Severe sepsis (POA)with septic shock secondary to disseminated MRSA infection thought to have stemmed from a tunneled PICC line that was not maintained or removed following 8-week course of IV antibiotics: Resolved sepsis. Pt also has pyomyositis (POA) for which he has had a right flank drain placed. He also has a left sided empyema s/p  thoracentesis/VATS (POA). Chest tube has been placed and now removed. CTS has signed off. There is also an epidural abscess that is being addressed conservatively as recommended by neurosurgery. Urine culture collected on 06/02/2018 has grown out klebsiella pneumoniae. Pt is still receiving Teflaro. Sensitivities are pending. Urology has also signed off. Blood cultures drawn on admission have grown out MRSA. Follow up blood cultures drawn on 05/11/2019 have had no growth. He will require 6 weeks of IV Teflaro. He will need to follow up with Dr. Delaine Lame as outpatient. Palliative care has been consulted. Palliative care has been able to reach guardian and has made a recommendation for comfort care and discharge to hospice. He is now comfort care only and is awaiting placement in a residential hospice facility.  UTI: Urine culture has grown out more than 100 thousand CFU of klebsiella pneumoniae. The organism has been shown to be sensitive to imipanem. He has been started on meropenem, but this has now been stopped as the patient is on comfort measures awaiting placement in a residential hospice facility.  Anasarca: Diffuse edema with worsening renal function. Holding off IV fluids. Nephrology consulted and following. Comfort measures only now.  Worsening nonoliguric AKI on CKD 4 in the setting of solitary left kidney: Nephrology consulted. Pt is a poor candidate for HD. Palliative care has been consulted. Avoid nephrotoxins and hypotension. Renal ultrasound done on 05/23/2019 showed mild left hydronephrosis/pelviectasis, post right nephrectomy, mild bladder wall thickening. Baseline creatinine appears to be 2.3 with GFR in the 28. Creatinine worsening daily. Creatinine today is 6.26. Rate of increase does seem to be declining, however. Continue to monitor electrolytes, creatinine, and volume status. Nephrology has recommended against hemodialysis and for palliative care and hospice. Palliative care has been  able to reach guardian and has made a recommendation for comfort care and discharge to hospice. He is now comfort care only.  Non anion gap metabolic acidosis likely in the setting of worsening renal function: Improving on p.o. sodium bicarb 1300 mg twice daily. Managed by nephrology. Monitor. He is now comfort care only.  Refractory hyperkalemia in the setting of worsening renal function: Potassium 5.4>> 5.4.  Continue Lokelma 10 g daily as recommended by nephrology. He is now comfort care only.  Obstructing/infected renal calculus, Concurrent VRE UTI, likely POA, S/P emergency ureteral stent placement: Cystoscopy on 05/11/2019 and also was found to have a bulbar urethral stricture. The stricture was dilated. The stent was placed in the left ureter. The urine culture on 05/11/2019 was negative. A repeat urine culture was sent on 05/16/2019 and  positive for VRE. On ceftaroline per ID. Urine culture collected on 06/02/2018 has grown out more than 100 thousand CFU of klebsiella pneumoniae which is sensitive to imipenem. The patient is on Teflaro and cefepime. He is now comfort  care only.  A. fib/flutter with rapid ventricular rate, resolved: Rate is currently controlled on p.o. Lopressor 25 mg twice daily. Anticoagulation not recommended due to history of fall and anemia. Echo with normal LV function. Continue to monitor on telemetry. He is now comfort care only.  Acute hypoxic respiratory failure likely in the setting of left empyema and pulmonary edema: Not on oxygen supplementation at baseline. Currently requiring at least 2 L to maintain O2 saturation greater than 92%. Continue pulmonary toilet, incentive spirometer, flutter valve, and IV antibiotics. He is now comfort care only.  Acute blood loss anemia/iron deficiency anemia/Anemia of chronic disease 2/2 CKD: The patient has received 1 unit PRBC. Monitor hemoglobin. Multifactorial in the setting of worsening renal function, internal hemorrhoids with  positive FOBT, iron deficiency. He has received 1 dose of Feraheme 520 mg once on 06/02/2019. He will be continued on daily ferrous sulfate. Transfuse for hemoglobin less than 7.0. hemoglobin is 8.0 today. He is now comfort care only.  Acute transaminitis: Acute hepatitis panel negative. CT abdomen and pelvis done on 05/18/2019 without contrast: Multiple low-density lesions again seen within the liver, unchanged, likely cysts. Large exophytic cyst off the left. The hepatic lobe is stable. Gallbladder unremarkable. Trend LFTs and continue to avoid hepatotoxins. He is now comfort care only.  Alcohol withdrawal syndrome, resolved: Out of alcohol withdrawal window. No sign of alcohol withdrawal. He is now comfort care only.  Paranoid schizophrenia/chronic anxiety: Stable. He is now comfort care only.  Severe physical debility: PT OT assessed and recommended SNF. CSW assisting with placement. Continue PT OT with assistance and fall precautions. Out of bed to chair as tolerated. He is now comfort care only.  I have seen and examined this patient myself. I have spent 32 minutes in his evaluation and care.  DVT prophylaxis:Heparin sq TID Code Status:Full Family Communication:None at bedside Disposition Plan: Discharge to residential hospice when bed arranged for.  Coyt Govoni, DO [facility] Direct contact: see [email or web address]  7PM-7AM contact night coverage as above 06/11/2019, 2:00 PM  LOS: 11 days

## 2019-06-11 NOTE — Progress Notes (Signed)
Airline pilot following up on residential hospice at [facility] in [place].  There are no beds to offer today.   [facility] will follow up with [facility] manager when bed availability status changes.   Thank you,  Freddie Breech, RN [facility] Liaison [phone]

## 2019-06-13 ENCOUNTER — Ambulatory Visit: Payer: Medicare Other | Admitting: Cardiothoracic Surgery

## 2019-07-04 NOTE — Progress Notes (Signed)
Patient transported to morgue at this time. Patient had a ring on left ring finger.

## 2019-07-04 NOTE — Progress Notes (Signed)
Patient expired at this time. Two nurse verify with Mammie Lorenzo, RN and charge nurse. MD notified at this time via [place] page.

## 2019-07-04 NOTE — Progress Notes (Signed)
Alan Small, CSW notified at this time of patient's death.

## 2019-07-04 NOTE — Progress Notes (Signed)
0630 AM patient incontinent of bowel; responsive to verbal stimuli, nodding appropriately to questions asked during personal care; upon turning patient to left side to change sheets, noted patient became less responsive; vomited moderate amount of bilious emesis. Patient with agonal respirations. Bedside report along with update provided to Clement Husbands, RN.

## 2019-07-04 NOTE — Progress Notes (Signed)
CSW left voice message with [employer] legal Guardian & supervisor the patient has passed. CSW also contacted the on call SW, have not received return call.

## 2019-07-04 NOTE — Death Summary Note (Addendum)
Physician Death Summary  HENRY UTSEY BHA:[id number] DOB: 04/20/1956 DOA: May 25, 2019  PCP: [name], MD  Admit date: 05/15/2019 Date of Death: 06/13/19  Time of Death: 7:27 am  Follow-up Information    Ronny Bacon, MD. Schedule an appointment as soon as possible for a visit.   Specialty: Surgery Contact information: [address] [place] [address] [phone]          Discharge Diagnoses: Principal diagnosis is #1 Severe sepsis due to disseminated MRSA infection UTI Anasarca Worsening AKI on CKD 4 Non-Anion Gap Metabolic Acidosis Obstructed/infected renal calculus Atrial fibrillation/flutter with RVR Anemia due to Acute blood loss anemia, iron deficiency, and anemia of chronic disease Acute transaminitis Alcohol withdrawal syndrome Paranoid schizoprenia/chronic anxiety Severe physical debility  Discharge Condition: Deceased  Filed Weights   08-Jun-2019 0500 06/08/19 0456 06-13-2019 0727  Weight: 108.5 kg 109.7 kg 109.7 kg    History of present illness:   DAXTEN KOVALENKO is a 64 y.o. male with medical history significant of solitary left kidney, CKD stage IV, left staghorn calculus SP stenting, urethral stricture SP urethroplasty, schizophrenia, clinically diagnosed COPD, depression.  Patient was admitted at [facility] between February 10, 2019 and March 02, 2019. Patient was treated for pyelonephritis and retroperitoneal fluid collection concerning for abscess. Urine culture grew MDR E. coli. Patient underwent aspiration of the abscess on 9/17 with JP drain placement. Patient was started on IV imipenem with a PICC line. Summary ofhisactive problems in the hospital is as following.Underwent left percutaneous nephrolithotomy 03/28/2019 and ureteral stent placement at the same time. Underwent laser lithotripsy and basket extraction on 04/05/2019. Patient was recommended to continue the antibiotics until April 16, 2019.  Patient  presented to [facility] on 05/11/2019 with shortness of breath left leg pain and fever and nausea and vomiting. CT scan showed evidence of stone on the left side. Patient was initially placed on the BiPAP. His oxygenation worsened and patient actually required intubation in the emergency department. Urology was consulted and patient underwent urgent cystoscopy and left ureteral stent placement. Patient was started on broad-spectrum antibiotics and his blood cultures came back positive for MRSA. Patient was extubated on 05/20/2019 and since then has remained on nasal cannula. Patient's PICC line was removed on 05/12/2019 and a central line was placed which was removed on 05/18/2019. Foley catheter was inserted on 05/11/2019 after the urology procedure and removed on 05/18/2019. TEE showed no evidence of endocarditis but the patient does have evidence of septic emboli with cavitary lesions on bilateral lung parenchyma therefore patient is treated as infective endocarditis. 05/19/2019 left thoracentesis/pleural fluid culture came back positive for MRSA. Report showed loculated effusion post procedure. 05/20/2019 had pyomyositis underwent right-sided drain placement after incision and debridement. 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 [facility] neurosurgery was consulted who recommended conservative management. Dr. [name] was on call. Due to worsening shortness of breath CT chest performed on 05/22/2019 showed evidence of recollection of pleural fluid. Chest tube was inserted by PCCM on 05/22/2019. Patient currently in the process of being transferred to [facility] for further evaluation by cardiothoracic surgery as patient is not a candidate for TPA in pleural space due to epidural abscess and may require decortication.  Hospital Course:  FEDERICO MAIORINO a 64  y.o.malewith medical history significant ofsolitary left kidney, CKD stage IV, left staghorn calculus SP stenting, urethral stricture  SP urethroplasty, schizophrenia, clinically diagnosed COPD, depression.  Patient was admitted at [facility] between February 10, 2019 and March 02, 2019. Patient was treated for pyelonephritis and retroperitoneal fluid collection concerning for abscess. Urine culture grew MDR E. coli. Patient underwent aspiration of the abscess on 9/17 with JP drain placement. Patient was started on IV imipenem with a PICC line. Summary ofhisactive problems in the hospital is as following.Underwent left percutaneous nephrolithotomy 03/28/2019 and ureteral stent placement at the same time. Underwent laser lithotripsy and basket extraction on 04/05/2019. Patient was recommended to continue the antibiotics until April 16, 2019.  Patient presented to [facility] on 05/11/2019 with shortness of breath left leg pain and fever and nausea and vomiting. CT scan showed evidence of stone on the left side. Patient was initially placed on the BiPAP. His oxygenation worsened and patient actually required intubation in the emergency department. Urology was consulted and patient underwent urgent cystoscopy and left ureteral stent placement. Patient was started on broad-spectrum antibiotics and his blood cultures came back positive for MRSA. Patient was extubated on 05/20/2019 and since then has remained on nasal cannula. Patient's PICC line was removed on 05/12/2019 and a central line was placed which was removed on 05/18/2019. Foley catheter was inserted on 05/11/2019 after the urology procedure and removed on 05/18/2019. TEE performed on that day as well demonstrated an EF of 86-57% normal systolic function of the right ventricle. There were no regional wall motion abnormalities and diastolic dysfunction could not be evaluated. It showed no evidence of endocarditis but the patient does have  evidence of septic emboli with cavitary lesions on bilateral lung parenchyma therefore patient is treated as infective endocarditis. On 05/19/2019 left thoracentesis/pleural fluid culture came back positive for MRSA. Report showed loculated effusion post procedure. 05/20/2019 had pyomyositis underwent right-sided drain placement after incision and debridement. 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 [facility] neurosurgery was consulted who recommended conservative management. Dr. [name] was on call. Due to worsening shortness of breath CT chest performed on 05/22/2019 showed evidence of recollection of pleural fluid. Chest tube was inserted by PCCM on 05/22/2019. VATS was performed on 12/23. This was well tolerated well. The patient is on Ceftaroline now. Merrem added to cover klebsiella grown from urine culture. Clinically improving. Hemoglobin 12/29 8.5 from 6.7 post 1 unit PRBC transfusion 12/28.   Nephrology has recommended against hemodialysis and for palliative care and hospice. Palliative care has been able to reach guardian and has made a recommendation for comfort care and discharge to hospice. The guardian has agreed to comfort care measures and discharge to a residential hospice facility. He is now on comfort care measures and awaiting a bed in a residential hospice facility.  Today's assessment: Pt passed before he could be examined today. O: Vitals:  Vitals:   06/11/19 0915 06/11/19 2059  BP: (!) 94/49 (!) 119/53  Pulse: 82 86  Resp: 18 (!) 26  Temp:    SpO2: 100% 99%     Allergies  Allergen Reactions  . Mellaril [Thioridazine] Shortness Of Breath and Swelling  . Buprenorphine Itching  . Morphine And Related Itching  . Navane [Thiothixene] Other (See Comments)    Reaction:  GI upset   . Thorazine [Chlorpromazine] Other (See Comments)    Reaction:   Dizziness/fainting     The results of significant diagnostics from this hospitalization (including imaging, microbiology, ancillary and  laboratory) are listed below for reference.    Significant Diagnostic Studies: CT ABDOMEN PELVIS WO CONTRAST  Result Date: 06/04/2019 CLINICAL DATA:  Ureteral stent displacement. EXAM: CT ABDOMEN AND PELVIS WITHOUT CONTRAST TECHNIQUE: Multidetector CT imaging of the abdomen and pelvis was performed following the standard protocol without IV contrast. COMPARISON:  Renal ultrasound 06/02/2019, CT 05/18/2019 FINDINGS: Lower chest: Left pleural effusion has improved from prior exam. Moderate volume of persistent pleural fluid and adjacent airspace disease. Curvilinear density in the pleural fluid may represent postsurgical suture. Small right pleural effusion and adjacent atelectasis, increased from prior CT. Hepatobiliary: Small low-density lesions within the liver, incompletely characterized without IV contrast but likely cysts. This is stable from prior exam. Stable 10 cm exophytic cyst from the left lobe of the liver. Slight high-density gallbladder contents without pericholecystic inflammation. Pancreas: Questionable fat stranding about the proximal pancreas, difficult to assess given motion. No pancreatic ductal dilatation. Spleen: Normal in size without focal abnormality. Adrenals/Urinary Tract: No adrenal nodule. Right nephrectomy. Left ureteral stent in place, proximal pigtail in the upper pole calyx, distal pigtail in the bladder. Cortical calcification in the lower left kidney is unchanged. Minimal left perinephric edema without hydronephrosis. No stones along the course of the stent. Urinary bladder is nondistended. Suggestion of bladder wall thickening and perivesicular fat stranding. Stomach/Bowel: No bowel obstruction, enteric contrast throughout the colon. The stomach is nondistended. There is new moderate length small bowel wall thickening involving the fourth  portion of the duodenum and proximal jejunum with adjacent perienteric fat stranding. More distal small bowel are decompressed. Multifocal colonic diverticulosis without diverticulitis. Vascular/Lymphatic: Aortic atherosclerosis. No aortic aneurysm. Prominent retroperitoneal lymph nodes, likely reactive. There scattered calcified lymph nodes. Reproductive: Prostatic calcifications. Normal sized prostate gland. Other: Drainage catheter within the right abdominal wall musculature. Improvement in heterogeneous thickening. Persistent thickening and mild fat stranding superiorly. Small volume abdominopelvic ascites which is new from prior exam. Generalized subcutaneous emphysema about the body wall, also progressed. Musculoskeletal: Slight widening of the right L4-L5 facet with adjacent sclerosis, likely sequela of septic joint as seen on prior lumbar spine MRI. No evidence of perivertebral fluid collection on noncontrast exam. No new osseous abnormality. IMPRESSION: 1. Left ureteral stent in place without hydronephrosis. Minimal left perinephric edema. Bladder wall thickening with perivesicular fat stranding. 2. New moderate length small bowel wall thickening involving the fourth portion of the duodenum and proximal jejunum with adjacent perienteric fat stranding. Findings consistent with enteritis, new from prior imaging. 3. Questionable fat stranding about the proximal pancreas, difficult to assess given motion. Recommend correlation with pancreatic enzymes. 4. Small volume abdominopelvic ascites, new from prior exam. 5. Drainage catheter in the right abdominal wall musculature with decreased heterogeneous enlargement, mild residual heterogeneity and thickening superiorly. 6. Increased body wall edema since prior exam. Small right pleural effusion has progressed. 7. Persistent but decreased left pleural effusion and basilar airspace disease. 8. Additional stable chronic findings as described. Aortic Atherosclerosis  (ICD10-I70.0). Electronically Signed   By: Keith Rake M.D.   On: 06/04/2019 00:06   CT ABDOMEN PELVIS WO CONTRAST  Result Date: 05/18/2019 CLINICAL DATA:  Abdominal pain, fever. Solitary left kidney with obstructing ureteral calculus status post ureteral stent placement. EXAM: CT ABDOMEN AND PELVIS WITHOUT CONTRAST TECHNIQUE: Multidetector CT imaging of the abdomen and pelvis was performed following the standard protocol without IV contrast. COMPARISON:  05/15/2019 FINDINGS: Lower chest: Heart is normal size. Moderate left pleural effusion, increasing since prior study. Bilateral lower lobe atelectasis or consolidation. Stranding is  again noted within the epicardial fat pad along the left heart border, stable since recent study. Hepatobiliary: Multiple low-density lesions again seen within the liver, unchanged, likely cysts. Large exophytic cyst off the left hepatic lobe is stable. Gallbladder unremarkable. Pancreas: No focal abnormality or ductal dilatation. Spleen: No focal abnormality.  Normal size. Adrenals/Urinary Tract: Solitary left kidney. Left ureteral stent is in place, unchanged. No hydronephrosis. Calcification in the midpole of the left kidney is unchanged. Urinary bladder is unremarkable. Adrenal glands unremarkable. Stomach/Bowel: Stomach, large and small bowel grossly unremarkable. Vascular/Lymphatic: Aortic atherosclerosis. No enlarged abdominal or pelvic lymph nodes. Reproductive: Extensive prostate calcifications. Other: No free fluid or free air. Musculoskeletal: No acute bony abnormality. Again noted is enlargement of the right oblique abdominal wall musculature with possible low-density fluid collection within the muscle. This is similar to prior study. IMPRESSION: Moderate left pleural effusion, increased since prior study. Bibasilar atelectasis or consolidation/pneumonia. Enlargement of the right oblique abdominal wall musculature with low-density/fluid density within the muscle.  This could reflect myositis, muscle injury, or abscess. This is similar to prior study. Left ureteral stent in place. No hydronephrosis or change since prior study. Solitary left kidney. Aortic atherosclerosis. Electronically Signed   By: Rolm Baptise M.D.   On: 05/18/2019 21:23   CT ABDOMEN PELVIS WO CONTRAST  Result Date: 05/15/2019 CLINICAL DATA:  Right-sided edema and warmth and redness. Urinary tract infection. Recent placement of left ureteral stent. EXAM: CT CHEST, ABDOMEN AND PELVIS WITHOUT CONTRAST TECHNIQUE: Multidetector CT imaging of the chest, abdomen and pelvis was performed following the standard protocol without IV contrast. COMPARISON:  CT scans dated 05/11/2019 and 08/20/2018 FINDINGS: CT CHEST FINDINGS Cardiovascular: Slight aortic atherosclerosis. Heart size is normal. No pericardial effusion. However, there is extensive acute soft tissue stranding in the left pericardial fat pad, likely infectious. Mediastinum/Nodes: New extensive soft tissue stranding pericardial fat pad worrisome for infection. No mediastinal adenopathy. Thyroid gland is atrophic. Tiny hiatal hernia. Lungs/Pleura: There are multiple cavitary lesions in the left lung, likely representing septic emboli. There is an irregular 2.2 cm nodule in the left upper lobe which could represent a mass but is probably a septic embolus which is not yet cavitated. There is a 5 mm lesion in the lateral aspect of the left upper lobe which has not yet cavitated. Hazy infiltrate at the right lung apex with a small nodule posterior medially at the apex. Consolidation at the inferior aspect of the right hilum. There are new small bilateral pleural effusions, left greater than right with atelectasis at both lung bases. Musculoskeletal: There is slight soft tissue stranding around the inferior aspect of the right latissimus dorsi muscle soft tissue stranding in the subcutaneous fat of both flanks, right greater than left. No acute bone  abnormality. CT ABDOMEN PELVIS FINDINGS Hepatobiliary: 9 cm cyst on the tip of the left lobe of the liver in the left upper quadrant 2 cm cyst in the caudate lobe of the liver, unchanged. There are 216 mm cysts in the inferior aspect of the right lobe of the liver also unchanged. Biliary tree is normal. Pancreas: Unremarkable. No pancreatic ductal dilatation or surrounding inflammatory changes. Spleen: Normal in size without focal abnormality. Adrenals/Urinary Tract: Adrenal glands are normal. Left ureteral stent in place. Stone in the mid left kidney, unchanged. No hydronephrosis. Previous right nephrectomy. Foley catheter in the bladder. Stomach/Bowel: Stomach is within normal limits. Appendix has been removed. No evidence of bowel wall thickening, distention, or inflammatory changes. Vascular/Lymphatic: Aortic atherosclerosis. No enlarged  abdominal or pelvic lymph nodes. Reproductive: Numerous benign-appearing calcifications in the prostate gland. Other: No abdominal wall hernia or abnormality. No abdominopelvic ascites. Musculoskeletal: There is abnormal expansion of the muscles of the lateral aspect of the right abdominal wall with lucency in that area, worrisome for an intramuscular abscess. There is soft tissue stranding in the adjacent subcutaneous fat. Soft tissue stranding seen around the distal right latissimus Dorsey muscle and that muscle is slightly hypertrophied as compared to the left and may also be involved infection. There is subcutaneous edema at the lateral aspect of both hips, right more than left. No significant bone abnormality. IMPRESSION: 1. Multiple cavitary lesions in the left lung consistent with septic emboli. Abnormal soft tissue stranding in the prominent left pericardial fat pad also worrisome for infection. 2. Abnormal expansion of the muscles of the lateral aspect of the right abdominal wall with lucency in that area, worrisome for an intramuscular abscess/infectious myositis. 3.  Soft tissue stranding around the inferior aspect of the right latissimus dorsi muscle and in the subcutaneous fat of both flanks, right greater than left, consistent with cellulitis. 4. New small bilateral pleural effusions, left greater than right. 5. Aortic Atherosclerosis (ICD10-I70.0). 6. New left ureteral stent appears in good position. No acute abnormalities of the left kidney. Electronically Signed   By: Lorriane Shire M.D.   On: 05/15/2019 16:02   DG Skull 1-3 Views  Result Date: 05/21/2019 CLINICAL DATA:  Pre MRI clearance. EXAM: SKULL - 1-3 VIEW COMPARISON:  CT head 08/20/2018. FINDINGS: The skull is intact. There is no evidence of acute fracture or focal lesion. There are no radiopaque foreign bodies. Pineal region calcifications are noted. The visualized paranasal sinuses are clear. IMPRESSION: No evidence of intracranial radiopaque foreign body acute osseous findings. Electronically Signed   By: Richardean Sale M.D.   On: 05/21/2019 13:54   DG Chest 1 View  Result Date: 05/21/2019 CLINICAL DATA:  Increasing shortness of breath. Smoker. History of asthma/COPD. EXAM: CHEST  1 VIEW COMPARISON:  Radiographs 05/19/2019 and 05/15/2019. CT 05/15/2019. FINDINGS: 1324 hours. Left pleural effusion has partially reaccumulated and appears loculated laterally. The underlying cavitary lesions in the left lung are partially obscured, although grossly stable. There is increased compressive left lung atelectasis. The right lung is clear. There is no pneumothorax. The heart size and mediastinal contours are stable. IMPRESSION: 1. Interval partial reaccumulation of loculated left pleural effusion. 2. No pneumothorax. Electronically Signed   By: Richardean Sale M.D.   On: 05/21/2019 13:58   DG Chest 2 View  Result Date: 06/03/2019 CLINICAL DATA:  Shortness of breath and chest pain. COPD. Pleural effusion. EXAM: CHEST - 2 VIEW COMPARISON:  1 day prior FINDINGS: Midline trachea. Borderline cardiomegaly. Small  left pleural effusion, similar. Minimal loculation laterally. No pneumothorax. Primarily left-sided interstitial opacities, minimally improved. Airspace disease at the left lung base similar. Left upper lobe pulmonary nodule again identified. IMPRESSION: Minimal improvement in left-sided interstitial opacities, favoring asymmetric pulmonary edema. Small left pleural effusion and adjacent airspace disease, similar. Electronically Signed   By: Abigail Miyamoto M.D.   On: 06/03/2019 10:13   DG Chest 2 View  Result Date: 06/02/2019 CLINICAL DATA:  64 year old male under evaluation for pneumothorax. EXAM: CHEST - 2 VIEW COMPARISON:  Chest x-ray 06/01/2019. FINDINGS: No pneumothorax. Patchy multifocal interstitial and airspace disease in the lungs bilaterally (left much greater than right). 2.1 cm nodular density projecting over the left upper lobe, correlating to pulmonary nodule seen on prior chest CT 05/14/2019.  Moderate left pleural effusion which appears partially loculated. No right pleural effusion. No evidence of pulmonary edema. Heart size is normal. Upper mediastinal contours are within normal limits. Aortic atherosclerosis. IMPRESSION: 1. No pneumothorax. 2. Worsening patchy multifocal interstitial and airspace disease in the lungs bilaterally (left much greater than right), concerning for developing multilobar pneumonia. 3. Moderate left pleural effusion which remains partially loculated. 4. Left upper lobe pulmonary nodule. Electronically Signed   By: Vinnie Langton M.D.   On: 06/02/2019 09:34   DG Chest 2 View  Result Date: 05/29/2019 CLINICAL DATA:  Empyema. Status post left lower lobe wedge resection and drainage. EXAM: CHEST - 2 VIEW COMPARISON:  05/28/2019 FINDINGS: Benign 41 hours. Low volume film. The cardio pericardial silhouette is enlarged. 1 of 2 left chest tubes has been removed in the interval. Persistent left pleural thickening, stable. No evidence for associated left pneumothorax.  Retrocardiac collapse/consolidative opacity is stable. Right lung clear. Telemetry leads overlie the chest. IMPRESSION: 1. Interval removal of 1 of the left-sided chest tubes without evidence for residual pneumothorax. 2. Persistent left pleural thickening with retrocardiac left base collapse/consolidation. Electronically Signed   By: Misty Stanley M.D.   On: 05/29/2019 10:25   CT HEAD WO CONTRAST  Result Date: 05/22/2019 CLINICAL DATA:  Encephalopathy. Hallucinations. EXAM: CT HEAD WITHOUT CONTRAST TECHNIQUE: Contiguous axial images were obtained from the base of the skull through the vertex without intravenous contrast. COMPARISON:  08/20/2018 FINDINGS: Brain: There is no evidence of acute infarct, intracranial hemorrhage, mass, midline shift, or extra-axial fluid collection. Mild cerebral atrophy is unchanged and likely within normal limits for age. Low level hypoattenuation in the cerebral white matter is nonspecific but may reflect mild chronic small vessel ischemic disease. Vascular: Calcified atherosclerosis at the skull base. No hyperdense vessel. Skull: No fracture or focal osseous lesion. Sinuses/Orbits: Visualized paranasal sinuses and mastoid air cells are clear. Orbits are unremarkable. Other: None. IMPRESSION: No evidence of acute intracranial abnormality. Electronically Signed   By: Logan Bores M.D.   On: 05/22/2019 15:26   CT CHEST WO CONTRAST  Result Date: 05/21/2019 CLINICAL DATA:  Empyema. History of septic emboli with cavitary lesions. Patient is scheduled for VATS procedure 05/26/2019. EXAM: CT CHEST WITHOUT CONTRAST TECHNIQUE: Multidetector CT imaging of the chest was performed following the standard protocol without IV contrast. COMPARISON:  Chest CT dated 05/22/2019. FINDINGS: Cardiovascular: Heart size appears stable. No thoracic aortic aneurysm. Mild aortic atherosclerosis. Mediastinum/Nodes: Oral contrast within the lower esophagus. Esophagus otherwise unremarkable in  appearance. No mass or enlarged lymph nodes appreciated within the mediastinum. Trachea is unremarkable. Lungs/Pleura: LEFT-sided chest tube has been placed in the interval, located within a persistent large pleural effusion on the LEFT which is likely loculated to some degree. There is associated compressive atelectasis w along the course of the LEFT pleural effusion. No RIGHT-sided pleural effusion. Additional atelectasis at the RIGHT lung base. Again noted are scattered pulmonary nodules including a 2 cm LEFT upper lobe nodule (series 4, image 44) and a 1 cm LEFT apical nodule (image 27), posterior RIGHT lower lobe nodules measuring 0.7 cm and 1.3 cm (images 36 and 46 respectively, and a RIGHT lower lobe nodule measuring 1.1 cm (image 62). All of these nodules are similar in size. No new nodules or consolidations identified within either lung. No pneumothorax. Upper Abdomen: Limited images of the upper abdomen are unremarkable. Musculoskeletal: No acute or suspicious osseous finding. IMPRESSION: 1. LEFT-sided chest tube has been placed in the interval, located within a persistent  large LEFT pleural effusion which is likely loculated to some degree. Associated compressive atelectasis along the course of the LEFT pleural effusion. 2. Additional atelectasis at the RIGHT lung base. No RIGHT-sided pleural effusion. 3. Stable bilateral pulmonary nodules, as detailed above, compatible with the previous description of septic emboli. No new nodules or consolidations identified within either lung. Aortic Atherosclerosis (ICD10-I70.0). Electronically Signed   By: Franki Cabot M.D.   On: 05/06/2019 09:36   CT CHEST WO CONTRAST  Result Date: 05/22/2019 CLINICAL DATA:  64 year old male with increased shortness of breath, known septic emboli EXAM: CT CHEST WITHOUT CONTRAST TECHNIQUE: Multidetector CT imaging of the chest was performed following the standard protocol without IV contrast. COMPARISON:  Prior CT scan of the  chest 05/15/2019 FINDINGS: Cardiovascular: Two vessel aortic arch. The right brachiocephalic and left common carotid artery share a common origin. The aorta is normal in caliber. The main pulmonary artery is normal in caliber. The heart is normal in size. No pericardial effusion. Mediastinum/Nodes: Unremarkable CT appearance of the thyroid gland. No suspicious mediastinal or hilar adenopathy. No soft tissue mediastinal mass. The thoracic esophagus is unremarkable. Lungs/Pleura: Approximately 1.1 cm left upper lobe pulmonary nodule is similar in size compared to prior but with decreased central cavitation. Left-sided pulmonary nodule measures 2.5 cm in diameter. Numerous additional scattered pulmonary nodules as previously seen. Increased subsegmental atelectasis in the right lung base. No evidence of pulmonary edema. Enlarging left pleural effusion with associated left lower lobe atelectasis. Upper Abdomen: Circumscribed 10.4 cm low-attenuation cystic lesion exophytic from the left lobe of the liver. There is a smaller 2.4 cm cyst arising from the caudate lobe of the liver and an additional low-attenuation lesion posteriorly in hepatic segment 6 which measures 2 cm. All lesions are essentially unchanged compared to the recent prior imaging. Musculoskeletal: No acute fracture or aggressive appearing lytic or blastic osseous lesion. IMPRESSION: 1. Persistent bilateral pulmonary nodules with slightly decreased cavitation compared to the recent prior imaging. Findings remain most consistent with bilateral septic pulmonary emboli. 2. Enlarging left-sided pleural effusion with left lower lobe atelectasis. 3. Slightly increased right lower lobe atelectasis as well. 4. No evidence of pulmonary edema. 5. Additional ancillary findings as above without significant interval change. Electronically Signed   By: Jacqulynn Cadet M.D.   On: 05/22/2019 15:33   CT CHEST WO CONTRAST  Result Date: 05/15/2019 CLINICAL DATA:   Right-sided edema and warmth and redness. Urinary tract infection. Recent placement of left ureteral stent. EXAM: CT CHEST, ABDOMEN AND PELVIS WITHOUT CONTRAST TECHNIQUE: Multidetector CT imaging of the chest, abdomen and pelvis was performed following the standard protocol without IV contrast. COMPARISON:  CT scans dated 05/11/2019 and 08/20/2018 FINDINGS: CT CHEST FINDINGS Cardiovascular: Slight aortic atherosclerosis. Heart size is normal. No pericardial effusion. However, there is extensive acute soft tissue stranding in the left pericardial fat pad, likely infectious. Mediastinum/Nodes: New extensive soft tissue stranding pericardial fat pad worrisome for infection. No mediastinal adenopathy. Thyroid gland is atrophic. Tiny hiatal hernia. Lungs/Pleura: There are multiple cavitary lesions in the left lung, likely representing septic emboli. There is an irregular 2.2 cm nodule in the left upper lobe which could represent a mass but is probably a septic embolus which is not yet cavitated. There is a 5 mm lesion in the lateral aspect of the left upper lobe which has not yet cavitated. Hazy infiltrate at the right lung apex with a small nodule posterior medially at the apex. Consolidation at the inferior aspect of the right  hilum. There are new small bilateral pleural effusions, left greater than right with atelectasis at both lung bases. Musculoskeletal: There is slight soft tissue stranding around the inferior aspect of the right latissimus dorsi muscle soft tissue stranding in the subcutaneous fat of both flanks, right greater than left. No acute bone abnormality. CT ABDOMEN PELVIS FINDINGS Hepatobiliary: 9 cm cyst on the tip of the left lobe of the liver in the left upper quadrant 2 cm cyst in the caudate lobe of the liver, unchanged. There are 216 mm cysts in the inferior aspect of the right lobe of the liver also unchanged. Biliary tree is normal. Pancreas: Unremarkable. No pancreatic ductal dilatation or  surrounding inflammatory changes. Spleen: Normal in size without focal abnormality. Adrenals/Urinary Tract: Adrenal glands are normal. Left ureteral stent in place. Stone in the mid left kidney, unchanged. No hydronephrosis. Previous right nephrectomy. Foley catheter in the bladder. Stomach/Bowel: Stomach is within normal limits. Appendix has been removed. No evidence of bowel wall thickening, distention, or inflammatory changes. Vascular/Lymphatic: Aortic atherosclerosis. No enlarged abdominal or pelvic lymph nodes. Reproductive: Numerous benign-appearing calcifications in the prostate gland. Other: No abdominal wall hernia or abnormality. No abdominopelvic ascites. Musculoskeletal: There is abnormal expansion of the muscles of the lateral aspect of the right abdominal wall with lucency in that area, worrisome for an intramuscular abscess. There is soft tissue stranding in the adjacent subcutaneous fat. Soft tissue stranding seen around the distal right latissimus Dorsey muscle and that muscle is slightly hypertrophied as compared to the left and may also be involved infection. There is subcutaneous edema at the lateral aspect of both hips, right more than left. No significant bone abnormality. IMPRESSION: 1. Multiple cavitary lesions in the left lung consistent with septic emboli. Abnormal soft tissue stranding in the prominent left pericardial fat pad also worrisome for infection. 2. Abnormal expansion of the muscles of the lateral aspect of the right abdominal wall with lucency in that area, worrisome for an intramuscular abscess/infectious myositis. 3. Soft tissue stranding around the inferior aspect of the right latissimus dorsi muscle and in the subcutaneous fat of both flanks, right greater than left, consistent with cellulitis. 4. New small bilateral pleural effusions, left greater than right. 5. Aortic Atherosclerosis (ICD10-I70.0). 6. New left ureteral stent appears in good position. No acute  abnormalities of the left kidney. Electronically Signed   By: Lorriane Shire M.D.   On: 05/15/2019 16:02   MR THORACIC SPINE WO CONTRAST  Result Date: 05/21/2019 CLINICAL DATA:  Sepsis. Irrigation and drainage of right flank abscess yesterday. EXAM: MRI THORACIC AND LUMBAR SPINE WITHOUT CONTRAST TECHNIQUE: Multiplanar and multiecho pulse sequences of the thoracic and lumbar spine were obtained without intravenous contrast. COMPARISON:  Abdominopelvic CT 05/18/2019. Chest CT 05/15/2019. FINDINGS: MRI THORACIC SPINE FINDINGS Alignment:  Normal. Vertebrae: There is a hemangioma within the left aspect of the T7 vertebral body. No evidence of acute fracture, subluxation, discitis or osteomyelitis. The localizing images demonstrate spondylosis in the lower cervical spine without apparent acute abnormality. Cord: The thoracic cord appears normal in signal and caliber. Conus medullaris extends to the L1 level. Paraspinal and other soft tissues: No focal paraspinal abnormalities are identified. There are left-greater-than-right pleural effusions with dependent airspace opacities bilaterally. There are nodular components in both lungs, also similar to previous CT. Disc levels: Detail is mildly degraded by motion artifact. No large disc herniations are demonstrated. There is a small right paracentral disc protrusion at T6-7. There is a slightly larger right paracentral disc protrusion  at T7-8. No cord deformity or significant foraminal narrowing. MRI LUMBAR SPINE FINDINGS Segmentation: There is transitional lumbosacral anatomy. As numbered from above, there is a transitional S1 segment with a vestigial S1-2 disc space. Alignment:  Normal. Vertebrae: There is no evidence of discitis or vertebral body osteomyelitis. However, the right L4-5 facet joint is widened with a joint effusion and surrounding soft tissue edema. There is a posterior epidural fluid collection which extends superiorly from the L4-5 facet joint to the  upper L3 level. On the sagittal images, this extends approximately 7.3 cm in length (image 9/1). This fluid collection measures up to 14 x 9 mm transverse (image 22/4) and is highly suspicious for an epidural abscess. There is some mass effect on the thecal sac. No apparent cortical destruction of the posterior elements. Conus medullaris and cauda equina: Conus extends to the L1 level. Paraspinal and other soft tissues: As above, there are paraspinal inflammatory changes around the right L4-5 facet joint with a posterior epidural fluid collection suspicious for an abscess. No paraspinal abscesses are identified. Disc levels: No significant disc space findings at or above L3-4. As described above, the posterior epidural abscess extends superiorly to the level of the upper L3 vertebral body. L4-5: Mild disc bulging. As above, asymmetric right facet joint effusion with paraspinal and posterior right epidural fluid collections highly worrisome for infection. No significant foraminal compromise. L5-S1: Loss of disc height with annular disc bulging and a broad-based central disc protrusion. Mild narrowing of the right lateral recess and mild bilateral facet hypertrophy. The foramina are sufficiently patent. S1-2: Transitional disc space level appears unremarkable. IMPRESSION: 1. Transitional lumbosacral anatomy. As numbered from above, there is a transitional S1 segment with a vestigial S1-2 disc space. 2. Probable septic right L4-5 facet joint with paraspinal inflammatory changes and a posterior epidural abscess extending superiorly from the L4-5 facet joint to the upper L3 level. There is some mass effect on the thecal sac. 3. No paraspinal abscesses identified. 4. No evidence of discitis or vertebral body osteomyelitis. 5. Bilateral pleural effusions with nodular components bilaterally consistent with septic emboli, similar to previous CT. 6. These results were called by telephone at the time of interpretation on  05/21/2019 at 2:45 pm to provider [facility] PATEL , who verbally acknowledged these results. Electronically Signed   By: Richardean Sale M.D.   On: 05/21/2019 14:50   MR LUMBAR SPINE WO CONTRAST  Result Date: 05/21/2019 CLINICAL DATA:  Sepsis. Irrigation and drainage of right flank abscess yesterday. EXAM: MRI THORACIC AND LUMBAR SPINE WITHOUT CONTRAST TECHNIQUE: Multiplanar and multiecho pulse sequences of the thoracic and lumbar spine were obtained without intravenous contrast. COMPARISON:  Abdominopelvic CT 05/18/2019. Chest CT 05/15/2019. FINDINGS: MRI THORACIC SPINE FINDINGS Alignment:  Normal. Vertebrae: There is a hemangioma within the left aspect of the T7 vertebral body. No evidence of acute fracture, subluxation, discitis or osteomyelitis. The localizing images demonstrate spondylosis in the lower cervical spine without apparent acute abnormality. Cord: The thoracic cord appears normal in signal and caliber. Conus medullaris extends to the L1 level. Paraspinal and other soft tissues: No focal paraspinal abnormalities are identified. There are left-greater-than-right pleural effusions with dependent airspace opacities bilaterally. There are nodular components in both lungs, also similar to previous CT. Disc levels: Detail is mildly degraded by motion artifact. No large disc herniations are demonstrated. There is a small right paracentral disc protrusion at T6-7. There is a slightly larger right paracentral disc protrusion at T7-8. No cord deformity or significant foraminal  narrowing. MRI LUMBAR SPINE FINDINGS Segmentation: There is transitional lumbosacral anatomy. As numbered from above, there is a transitional S1 segment with a vestigial S1-2 disc space. Alignment:  Normal. Vertebrae: There is no evidence of discitis or vertebral body osteomyelitis. However, the right L4-5 facet joint is widened with a joint effusion and surrounding soft tissue edema. There is a posterior epidural fluid collection which  extends superiorly from the L4-5 facet joint to the upper L3 level. On the sagittal images, this extends approximately 7.3 cm in length (image 9/1). This fluid collection measures up to 14 x 9 mm transverse (image 22/4) and is highly suspicious for an epidural abscess. There is some mass effect on the thecal sac. No apparent cortical destruction of the posterior elements. Conus medullaris and cauda equina: Conus extends to the L1 level. Paraspinal and other soft tissues: As above, there are paraspinal inflammatory changes around the right L4-5 facet joint with a posterior epidural fluid collection suspicious for an abscess. No paraspinal abscesses are identified. Disc levels: No significant disc space findings at or above L3-4. As described above, the posterior epidural abscess extends superiorly to the level of the upper L3 vertebral body. L4-5: Mild disc bulging. As above, asymmetric right facet joint effusion with paraspinal and posterior right epidural fluid collections highly worrisome for infection. No significant foraminal compromise. L5-S1: Loss of disc height with annular disc bulging and a broad-based central disc protrusion. Mild narrowing of the right lateral recess and mild bilateral facet hypertrophy. The foramina are sufficiently patent. S1-2: Transitional disc space level appears unremarkable. IMPRESSION: 1. Transitional lumbosacral anatomy. As numbered from above, there is a transitional S1 segment with a vestigial S1-2 disc space. 2. Probable septic right L4-5 facet joint with paraspinal inflammatory changes and a posterior epidural abscess extending superiorly from the L4-5 facet joint to the upper L3 level. There is some mass effect on the thecal sac. 3. No paraspinal abscesses identified. 4. No evidence of discitis or vertebral body osteomyelitis. 5. Bilateral pleural effusions with nodular components bilaterally consistent with septic emboli, similar to previous CT. 6. These results were called  by telephone at the time of interpretation on 05/21/2019 at 2:45 pm to provider [facility] PATEL , who verbally acknowledged these results. Electronically Signed   By: Richardean Sale M.D.   On: 05/21/2019 14:50   US RENAL  Result Date: 06/02/2019 CLINICAL DATA:  Acute kidney injury. EXAM: RENAL / URINARY TRACT ULTRASOUND COMPLETE COMPARISON:  Renal ultrasound 05/23/2019 FINDINGS: Right Kidney: Surgically absent. Left Kidney: Renal measurements: 11.4 x 6.4 x 4.5 cm = volume: 172 mL. Previous hydronephrosis has improved. Previous shadowing calculus is not seen. Patient with reported ureteral stent, not visualized sonographically. Bladder: Nondistended, questionable Foley catheter. Other: Technically limited exam. IMPRESSION: 1. Technically limited evaluation. Previous left hydronephrosis is no longer seen. Patient has reported left ureteral stent, however this is not visualized sonographically. Additionally the previous left renal calculus is not visualized, likely technical. 2. Prior right nephrectomy. 3. Decompressed urinary bladder. Electronically Signed   By: Keith Rake M.D.   On: 06/02/2019 03:33   US RENAL  Result Date: 05/23/2019 CLINICAL DATA:  Acute kidney injury EXAM: RENAL / URINARY TRACT ULTRASOUND COMPLETE COMPARISON:  CT abdomen and pelvis 05/18/2019 FINDINGS: Right Kidney: Renal measurements: Prior nephrectomy Left Kidney: Renal measurements: 9.6 x 5.9 x 6.0 cm = volume: 181 mL. Mild pelviectasis/hydronephrosis. 12 mm shadowing calcification in the midpole. Normal echotexture Bladder: Bladder wall appears mildly thickened measuring 6 mm anteriorly. Other: None.  IMPRESSION: Mild left hydronephrosis/pelviectasis, likely increased since prior CT. Mild bladder wall thickening. Electronically Signed   By: Rolm Baptise M.D.   On: 05/23/2019 11:49   DG Chest Port 1 View  Result Date: 06/01/2019 CLINICAL DATA:  Chest tube removal, shortness of breath EXAM: PORTABLE CHEST 1 VIEW COMPARISON:   05/31/2019 FINDINGS: Interval removal of left chest tube. No pneumothorax. Small residual left pleural effusion. Left base atelectasis and mild vascular congestion. Right lung clear. No acute bony abnormality. IMPRESSION: Interval removal of left chest tube without pneumothorax. Small left effusion with left base atelectasis. Electronically Signed   By: Rolm Baptise M.D.   On: 06/01/2019 08:35   DG Chest Port 1 View  Result Date: 05/31/2019 CLINICAL DATA:  Chest tube EXAM: PORTABLE CHEST 1 VIEW COMPARISON:  Yesterday FINDINGS: Left chest tube in stable position. No visible pneumothorax or increasing pleural fluid/thickening. There is still volume loss and hazy density on the left. Low volume but relatively clear right lung. Stable generous heart size accentuated by mediastinal fat by recent CT. IMPRESSION: Unchanged residual pleural fluid and/or thickening on the left. Stable left chest tube positioning. Electronically Signed   By: Monte Fantasia M.D.   On: 05/31/2019 07:27   DG CHEST PORT 1 VIEW  Result Date: 05/30/2019 CLINICAL DATA:  Chest tube. EXAM: PORTABLE CHEST 1 VIEW COMPARISON:  May 29, 2019. FINDINGS: Stable cardiomediastinal silhouette. Left-sided chest tube is noted and unchanged in position. No definite pneumothorax is noted. Right lung is clear. Stable left basilar atelectasis and possible small pleural effusion is noted. Bony thorax is unremarkable. IMPRESSION: Left-sided chest tube is unchanged in position. No definite pneumothorax is noted. Stable left basilar atelectasis and possible small pleural effusion. Electronically Signed   By: Marijo Conception M.D.   On: 05/30/2019 07:09   DG CHEST PORT 1 VIEW  Result Date: 05/28/2019 CLINICAL DATA:  Empyema. EXAM: PORTABLE CHEST 1 VIEW COMPARISON:  05/27/2019 FINDINGS: Left chest tubes remain in place. Similar peripheral opacity left hemithorax compatible with pleural fluid and/or thickening. Retrocardiac collapse/consolidation is  similar. The cardio pericardial silhouette is enlarged. Interstitial markings are diffusely coarsened with chronic features. Telemetry leads overlie the chest. IMPRESSION: Stable exam.  No new or acute interval findings. Electronically Signed   By: Misty Stanley M.D.   On: 05/28/2019 09:42   DG CHEST PORT 1 VIEW  Result Date: 05/27/2019 CLINICAL DATA:  Empyema. EXAM: PORTABLE CHEST 1 VIEW COMPARISON:  06/01/2019 and 05/26/2019 FINDINGS: No significant change in the appearance of the chest since the prior exam. 2 left chest tubes remain in place. There is persistent peripheral loculated fluid or pleural thickening on the left with atelectasis and or loculated fluid at the left lung base medially. There are hazy areas of nodular infiltrate in the left upper lung zone as demonstrated on the prior CT scan. The small nodular densities in the right lung demonstrated on CT are not apparent on this chest x-ray. The heart size and pulmonary vascularity are normal. Bones are normal. IMPRESSION: 1. No change in the appearance of the chest since the prior study. No pneumothorax. 2. Persistent peripheral loculated fluid or pleural thickening on the left. Electronically Signed   By: Lorriane Shire M.D.   On: 05/27/2019 10:06   DG CHEST PORT 1 VIEW  Result Date: 05/26/2019 CLINICAL DATA:  Empyema.  Chest tube. EXAM: PORTABLE CHEST 1 VIEW COMPARISON:  05/23/2019 FINDINGS: Left apical as well as left basilar chest tubes unchanged. Lungs are adequately inflated  with persistent hazy opacification over the left lung without significant change. Small to moderate left effusion unchanged. No definite pneumothorax. Right lung is clear. Cardiomediastinal silhouette and remainder of the exam is unchanged. IMPRESSION: Stable opacification over the left lung. Slight improvement left-sided effusion with basilar atelectasis. Two left-sided chest tubes unchanged. Electronically Signed   By: Marin Olp M.D.   On: 05/26/2019 07:51    DG Chest Port 1 View  Result Date: 05/06/2019 CLINICAL DATA:  Postop chest x-ray in PACU EXAM: PORTABLE CHEST 1 VIEW COMPARISON:  CT 05/31/2019, radiograph 05/06/2019 FINDINGS: There is diffusely increased opacity through the left hemithorax with diminished size of the complex left pleural effusion within apical left chest tube and basilar left chest tube in place lung volumes are diminished in the right hemithorax with increased streaky opacity in vascular crowding favoring atelectatic change. Multifocal nodular opacities are again noted in the left lung. Lesions in the right lung are difficult to visualize. Cardiomediastinal contours are partially obscured by overlying opacity but appear grossly similar accounting for differences in technique and lung inflation. No acute osseous or soft tissue abnormality. IMPRESSION: 1. Persistent asymmetric opacity through the left hemithorax reflecting distributed pleural fluid but with likely overall decrease in size of the complex left pleural effusion. Apical and basal left chest tubes in place. 2. Multifocal nodular opacities in the left lung are grossly similar in appearance to prior studies. 3. Multifocal nodular opacities in the right lung are difficult to visualize. Electronically Signed   By: Lovena Le M.D.   On: 05/12/2019 19:17   DG Chest Port 1 View  Result Date: 05/31/2019 CLINICAL DATA:  Pleural effusion on the left EXAM: PORTABLE CHEST 1 VIEW COMPARISON:  Yesterday FINDINGS: Unchanged positioning of left pleural catheter with unchanged sizable left pleural effusion. The underlying lung is opacified/atelectatic by CT. Nodular opacity in the left upper lobe is redemonstrated. The right chest is essentially clear. Normal heart size. IMPRESSION: Unchanged sizable and loculated appearing left pleural effusion despite stably positioned pleural catheter. Electronically Signed   By: Monte Fantasia M.D.   On: 05/03/2019 05:19   DG Chest Port 1  View  Result Date: 05/23/2019 CLINICAL DATA:  Empyema. EXAM: PORTABLE CHEST 1 VIEW COMPARISON:  Radiograph yesterday. CT yesterday. FINDINGS: Left-sided chest tube with tip directed laterally towards the upper lung zone. Decreased left pleural effusion from prior exam. Pulmonary opacities on the left with upper lobe nodules. Pulmonary nodules on the right on CT are not well visualized radiographically. No pneumothorax. IMPRESSION: 1. Left chest tube in place with decreased left pleural effusion. 2. Persistent opacities throughout the left hemithorax, some of which are nodular in correspond to nodules on CT. Electronically Signed   By: Keith Rake M.D.   On: 05/23/2019 05:06   DG Chest Port 1 View  Result Date: 05/22/2019 CLINICAL DATA:  Status post left chest tube placement EXAM: PORTABLE CHEST 1 VIEW COMPARISON:  May 22, 2019 FINDINGS: A left chest tube has been placed in the interval. The left-sided pleural effusion remains but is smaller. There may be some air in the pleural space on the left, introduced from the chest tube placement. No other evidence of pneumothorax. No other acute abnormalities. Opacity underlying the effusion remains. IMPRESSION: A left-sided chest tube has been placed and the left-sided pleural effusion remains but is smaller. There may be some air in the left pleural space, introduced from the chest tube placement. Opacity underlying the left-sided effusion remains, likely atelectasis. Electronically Signed  By: Dorise Bullion III M.D   On: 05/22/2019 18:39   DG Chest Port 1 View  Result Date: 05/22/2019 CLINICAL DATA:  Shortness of breath EXAM: PORTABLE CHEST 1 VIEW COMPARISON:  05/21/2019 FINDINGS: Parents slight increase in moderate to large loculated LEFT pleural effusion noted with increasing LEFT lung opacities/atelectasis. Slightly increasing mild interstitial opacities within the RIGHT lung noted. No evidence of pneumothorax. No other significant change.  IMPRESSION: 1. Slight increase in moderate to large loculated LEFT pleural effusion with increasing LEFT lung opacities/atelectasis. 2. Slightly increasing mild interstitial opacities within the RIGHT lung which may represent edema or infection. Electronically Signed   By: Margarette Canada M.D.   On: 05/22/2019 14:56   DG Chest Port 1 View  Result Date: 05/19/2019 CLINICAL DATA:  Post left-sided thoracentesis EXAM: PORTABLE CHEST 1 VIEW COMPARISON:  CT abdomen pelvis-05/18/2019; chest CT-05/15/2019; chest radiograph-05/15/2019 FINDINGS: Grossly unchanged cardiac silhouette and mediastinal contours. Interval reduction in persistent small likely partially loculated left-sided effusion post thoracentesis. No pneumothorax. Improved aeration of the left lung base with persistent left basilar heterogeneous/consolidative opacities. Cavitary nodules within the left upper lung are grossly unchanged, better demonstrated on chest CT performed 05/15/2019. The right lung remains well aerated. No definite right-sided pleural effusion. No definite evidence of edema. No acute osseous abnormalities. IMPRESSION: 1. Interval reduction in persistent small likely partially loculated left-sided effusion post thoracentesis. No pneumothorax. 2. Improved aeration of the left lung base with persistent left basilar consolidative opacities, atelectasis versus infiltrate. 3. Similar appearance of cavitary nodules with the left upper lung better demonstrated on recent chest CT again worrisome for septic emboli. Electronically Signed   By: Sandi Mariscal M.D.   On: 05/19/2019 12:30   DG Chest Port 1 View  Result Date: 05/15/2019 CLINICAL DATA:  Pulmonary disease, cough EXAM: PORTABLE CHEST 1 VIEW COMPARISON:  05/12/2019 FINDINGS: Interval endotracheal and esophagogastric extubation. Left neck vascular catheter, tip projecting over the lower SVC. Diffuse interstitial pulmonary opacity, similar to prior examination. Cardiomegaly. IMPRESSION: 1.   Interval endotracheal and esophagogastric extubation. 2. Diffuse interstitial pulmonary opacity, similar to prior examination, and consistent with edema or infection. No new airspace opacity. 3.  Cardiomegaly. Electronically Signed   By: Eddie Candle M.D.   On: 05/15/2019 15:03   ECHO TEE  Result Date: 05/18/2019   TRANSESOPHOGEAL ECHO REPORT   Patient Name:   LOPEZ DENTINGER Date of Exam: 05/18/2019 Medical Rec #:  [id number]      Height:       72.0 in Accession #:    [id number]     Weight:       188.5 lb Date of Birth:  1955/07/04      BSA:          2.08 m Patient Age:    9 years       BP:           117/72 mmHg Patient Gender: M              HR:           101 bpm. Exam Location:  [facility]  Procedure: Transesophageal Echo, Cardiac Doppler and Color Doppler Indications:     R78.81 Bacteremia  History:         Patient has prior history of Echocardiogram examinations.  Sonographer:     Charmayne Sheer RDCS (AE) Referring Phys:  [id number] PAULA V ROSS Diagnosing Phys: Kate Sable MD  PROCEDURE: After discussion of the risks and benefits of a TEE, an  informed consent was obtained Education officer, museum. Local oropharyngeal anesthetic was provided with viscous lidocaine. Patients was under conscious sedation during this procedure. Anesthetic was administered intravenously by performing Physician: 4mcg of Fentanyl, 1.0mg  of Versed. The transesophogeal probe was passed through the esophogus of the patient. Imaged were obtained with the patient in a left lateral decubitus position. Image quality was good. The patient developed no complications during the procedure. IMPRESSIONS  1. Left ventricular ejection fraction, by visual estimation, is 60 to 65%. The left ventricle has normal function. There is no left ventricular hypertrophy.  2. Left ventricular diastolic function could not be evaluated.  3. The left ventricle has no regional wall motion abnormalities.  4. No evidence of vegetation/infection noted in any of the valves or  myocardium.  5. Global right ventricle has normal systolic function.The right ventricular size is normal. Right vetricular wall thickness was not assessed.  6. Left atrial size was normal.  7. Right atrial size was normal.  8. The mitral valve is degenerative. Trivial mitral valve regurgitation.  9. The tricuspid valve is normal in structure. Tricuspid valve regurgitation is not demonstrated. 10. Aortic valve regurgitation is mild to moderate. 11. The aortic valve is tricuspid. Aortic valve regurgitation is mild to moderate. 12. The pulmonic valve was grossly normal. Pulmonic valve regurgitation is not visualized. FINDINGS  Left Ventricle: Left ventricular ejection fraction, by visual estimation, is 60 to 65%. The left ventricle has normal function. The left ventricle has no regional wall motion abnormalities. The left ventricular internal cavity size was the left ventricle is normal in size. There is no left ventricular hypertrophy. Left ventricular diastolic function could not be evaluated. No evidence of vegetation/infection noted in any of the valves or myocardium. Right Ventricle: The right ventricular size is normal. Right vetricular wall thickness was not assessed. Global RV systolic function is has normal systolic function. Left Atrium: Left atrial size was normal in size. Right Atrium: Right atrial size was normal in size Pericardium: There is no evidence of pericardial effusion. Mitral Valve: The mitral valve is degenerative in appearance. Trivial mitral valve regurgitation. No vegetation noted. Tricuspid Valve: The tricuspid valve is normal in structure. Tricuspid valve regurgitation is not demonstrated. No vegetation noted. Aortic Valve: The aortic valve is tricuspid. Aortic valve regurgitation is mild to moderate. No vegetation noted. Pulmonic Valve: The pulmonic valve was grossly normal. Pulmonic valve regurgitation is not visualized. No vegetation. Aorta: The aortic root and ascending aorta are  structurally normal, with no evidence of dilitation. Venous: The inferior vena cava was not well visualized. Shunts: No atrial level shunt detected by color flow Doppler.  Kate Sable MD Electronically signed by Kate Sable MD Signature Date/Time: 05/18/2019/1:38:50 PM    Final    [place] EKG SITE RITE  Result Date: 05/18/2019 If Site Rite image not attached, placement could not be confirmed due to current cardiac rhythm.  US THORACENTESIS ASP PLEURAL SPACE W/IMG GUIDE  Result Date: 05/19/2019 INDICATION: Symptomatic left sided pleural effusion. Concern for septic pulmonary emboli. Please perform ultrasound-guided thoracentesis for diagnostic and therapeutic purposes. EXAM: US THORACENTESIS ASP PLEURAL SPACE W/IMG GUIDE COMPARISON:  Chest CT-05/15/2019 MEDICATIONS: None. COMPLICATIONS: None immediate. TECHNIQUE: Informed written consent was obtained from the patient after a discussion of the risks, benefits and alternatives to treatment. A timeout was performed prior to the initiation of the procedure. Initial ultrasound scanning demonstrates a small to moderate sized complex/loculated left-sided pleural effusion. The lower chest was prepped and draped in the usual sterile fashion. 1%  lidocaine was used for local anesthesia. An ultrasound image was saved for documentation purposes. An 8 Fr Safe-T-Centesis catheter was introduced. The thoracentesis was performed. The catheter was removed and a dressing was applied. The patient tolerated the procedure well without immediate post procedural complication. The patient was escorted to have an upright chest radiograph. FINDINGS: A total of approximately 500 cc of slightly brown-colored serous fluid was removed. Requested samples were sent to the laboratory. IMPRESSION: Successful ultrasound-guided left sided thoracentesis yielding 500 cc of slightly brown colored serous pleural fluid. Electronically Signed   By: Sandi Mariscal M.D.   On: 05/19/2019 12:34     Microbiology: Recent Results (from the past 240 hour(s))  Culture, Urine     Status: Abnormal   Collection Time: 06/03/19  9:04 AM   Specimen: Urine, Random  Result Value Ref Range Status   Specimen Description URINE, RANDOM  Final   Special Requests NONE  Final   Culture (A)  Final    >=100,000 COLONIES/mL KLEBSIELLA PNEUMONIAE >=100,000 COLONIES/mL VANCOMYCIN RESISTANT ENTEROCOCCUS ISOLATED Confirmed Extended Spectrum Beta-Lactamase Producer (ESBL).  In bloodstream infections from ESBL organisms, carbapenems are preferred over piperacillin/tazobactam. They are shown to have a lower risk of mortality. FOR KLEBSIELLA PNEUMONIAE Performed at [facility], [place] [address], [place], [place] [address]    Report Status 06/06/2019 FINAL  Final   Organism ID, Bacteria KLEBSIELLA PNEUMONIAE (A)  Final   Organism ID, Bacteria VANCOMYCIN RESISTANT ENTEROCOCCUS ISOLATED (A)  Final      Susceptibility   Klebsiella pneumoniae - MIC*    AMPICILLIN >=32 RESISTANT Resistant     CEFAZOLIN >=64 RESISTANT Resistant     CEFTRIAXONE >=64 RESISTANT Resistant     CIPROFLOXACIN 2 INTERMEDIATE Intermediate     GENTAMICIN <=1 SENSITIVE Sensitive     IMIPENEM <=0.25 SENSITIVE Sensitive     NITROFURANTOIN 64 INTERMEDIATE Intermediate     TRIMETH/SULFA >=320 RESISTANT Resistant     AMPICILLIN/SULBACTAM >=32 RESISTANT Resistant     PIP/TAZO 16 SENSITIVE Sensitive     * >=100,000 COLONIES/mL KLEBSIELLA PNEUMONIAE   Vancomycin resistant enterococcus isolated - MIC*    AMPICILLIN 8 SENSITIVE Sensitive     NITROFURANTOIN 256 RESISTANT Resistant     VANCOMYCIN >=32 RESISTANT Resistant     LINEZOLID 2 SENSITIVE Sensitive     * >=100,000 COLONIES/mL VANCOMYCIN RESISTANT ENTEROCOCCUS ISOLATED  Expectorated sputum assessment w rflx to resp cult     Status: None   Collection Time: 06/03/19  2:36 PM   Specimen: Sputum  Result Value Ref Range Status   Specimen Description SPUTUM  Final   Special Requests  Normal  Final   Sputum evaluation   Final    THIS SPECIMEN IS ACCEPTABLE FOR SPUTUM CULTURE Performed at [facility], [address] [place], [place] [address]    Report Status 06/03/2019 FINAL  Final  Culture, respiratory     Status: None   Collection Time: 06/03/19  2:36 PM   Specimen: SPU  Result Value Ref Range Status   Specimen Description SPUTUM  Final   Special Requests Normal Reflexed from [id number]  Final   Gram Stain   Final    RARE WBC PRESENT, PREDOMINANTLY PMN FEW SQUAMOUS EPITHELIAL CELLS PRESENT ABUNDANT GRAM POSITIVE COCCI ABUNDANT GRAM NEGATIVE RODS ABUNDANT GRAM POSITIVE RODS FEW YEAST Performed at [facility], [place] [address], [place], [place] [address]    Culture   Final    MODERATE KLEBSIELLA PNEUMONIAE Confirmed Extended Spectrum Beta-Lactamase  Producer (ESBL).  In bloodstream infections from ESBL organisms, carbapenems are preferred over piperacillin/tazobactam. They are shown to have a lower risk of mortality.    Report Status 06/06/2019 FINAL  Final   Organism ID, Bacteria KLEBSIELLA PNEUMONIAE  Final      Susceptibility   Klebsiella pneumoniae - MIC*    AMPICILLIN >=32 RESISTANT Resistant     CEFAZOLIN >=64 RESISTANT Resistant     CEFEPIME >=32 RESISTANT Resistant     CEFTAZIDIME RESISTANT Resistant     CEFTRIAXONE >=64 RESISTANT Resistant     CIPROFLOXACIN 0.5 SENSITIVE Sensitive     GENTAMICIN <=1 SENSITIVE Sensitive     IMIPENEM <=0.25 SENSITIVE Sensitive     TRIMETH/SULFA >=320 RESISTANT Resistant     AMPICILLIN/SULBACTAM >=32 RESISTANT Resistant     PIP/TAZO <=4 SENSITIVE Sensitive     * MODERATE KLEBSIELLA PNEUMONIAE     Labs: Basic Metabolic Panel: Recent Labs  Lab 06/06/19 0516 06/07/19 0316 06/08/19 0245  NA 135 136 135  K 5.4* 5.2* 5.0  CL 107 106 108  CO2 18* 16* 16*  GLUCOSE 87 80 88  BUN 112* 118* 122*  CREATININE 6.26* 6.67* 7.31*  CALCIUM 7.3* 7.4* 7.4*  PHOS  --  9.1*  --    Liver Function Tests: Recent  Labs  Lab 06/07/19 0316  ALBUMIN <1.0*   No results for input(s): LIPASE, AMYLASE in the last 168 hours. No results for input(s): AMMONIA in the last 168 hours. CBC: Recent Labs  Lab 06/06/19 0516 06/07/19 0316 06/08/19 0245  WBC 9.9 10.1 9.4  NEUTROABS 7.9*  --  5.9  HGB 8.0* 8.2* 7.7*  HCT 26.4* 27.5* 25.3*  MCV 95.0 96.8 94.1  PLT 220 224 245   Cardiac Enzymes: No results for input(s): CKTOTAL, CKMB, CKMBINDEX, TROPONINI in the last 168 hours. BNP: BNP (last 3 results) No results for input(s): BNP in the last 8760 hours.  ProBNP (last 3 results) No results for input(s): PROBNP in the last 8760 hours.  CBG: Recent Labs  Lab 06/07/19 0615 06/07/19 1147 06/07/19 1646 06/07/19 2118 06/08/19 0615  GLUCAP 73 81 88 85 88    Principal Problem:   Empyema of pleural space (HCC) Active Problems:   Acute renal failure superimposed on chronic kidney disease (HCC)   Schizophrenia (HCC)   Hypothyroidism   Severe sepsis (HCC)   Pyohydronephrosis   Kidney stone   Chronic kidney disease, stage IV (severe) (HCC)   COPD (chronic obstructive pulmonary disease) (HCC)   Atrial flutter (HCC)   MRSA bacteremia   Septic embolism (HCC)   Epidural abscess   ETOH abuse   Palliative care by specialist   Terminal care   Generalized pain   Anxiety state   Time coordinating discharge: 38 minutes.  Signed:        Saladin Petrelli, DO [facility]  Jul 06, 2019, 7:43 AM

## 2019-07-04 NOTE — Progress Notes (Signed)
Patient found sitting on side of bed, disoriented to place; flexiseal pulled out; patient reoriented easily; assisted back to bed; personal care provided; linens and gowned changed. Bed alarm activated.

## 2019-07-04 DEATH — deceased

## 2021-03-14 IMAGING — CT CT RENAL STONE PROTOCOL
2 of 4 series · 16 of 46 positions shown, 18 images · non-contrast
Comparison: CT scan dated 11/06/2012 and 01/17/2015

CLINICAL DATA: Flank pain.  Hematuria.  History of stones.

EXAM:
CT ABDOMEN AND PELVIS WITHOUT CONTRAST
TECHNIQUE: Multidetector CT imaging of the abdomen and pelvis was performed
following the standard protocol without IV contrast.

[Series 3: stone full standard · axial · 0.74mm/px · z∈[-1067,-627]mm · 13 of 96 slices shown, 15 images]
[im 4/96  soft-tissue]
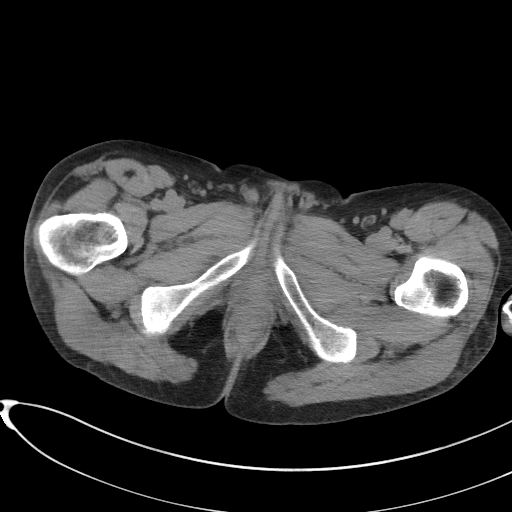
[im 4/96  bone]
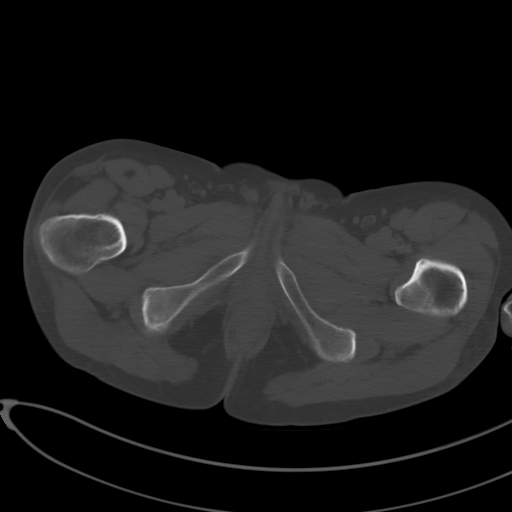
[im 12/96  soft-tissue]
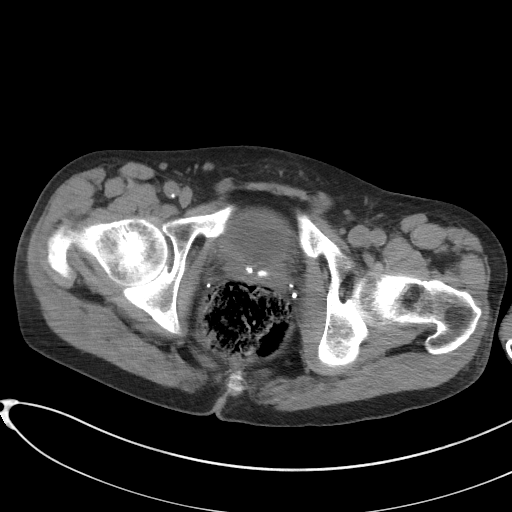
[im 20/96  soft-tissue]
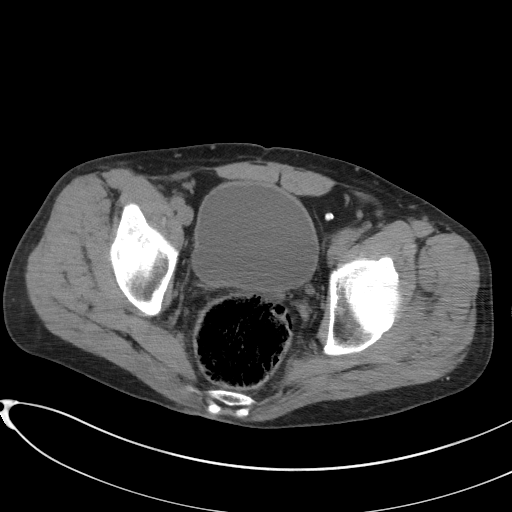
[im 27/96  soft-tissue]
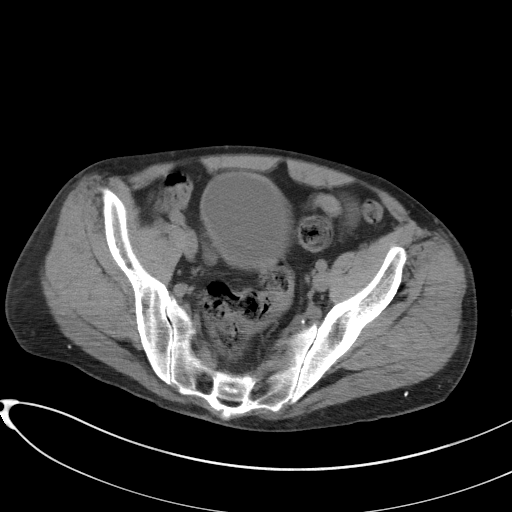
[im 35/96  soft-tissue]
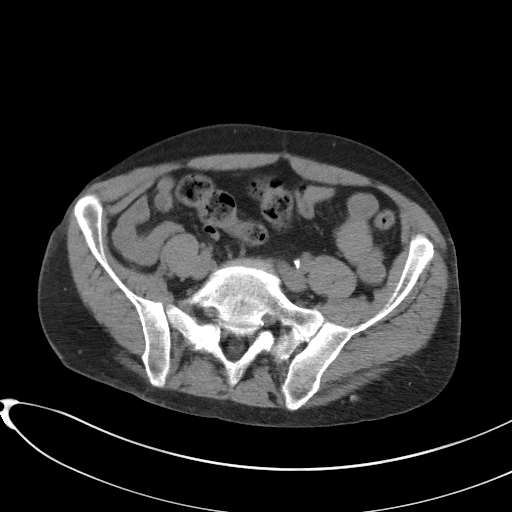
[im 42/96  soft-tissue]
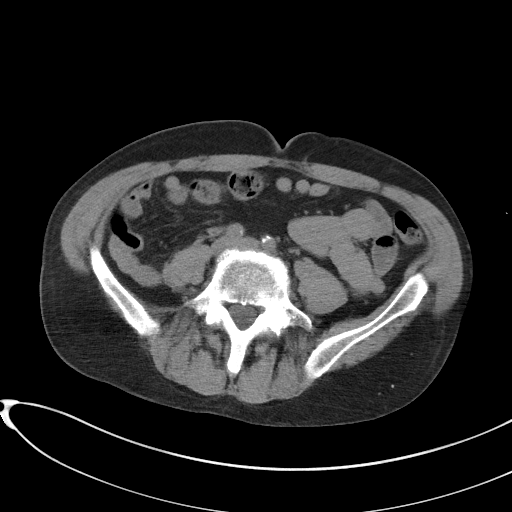
[im 50/96  soft-tissue]
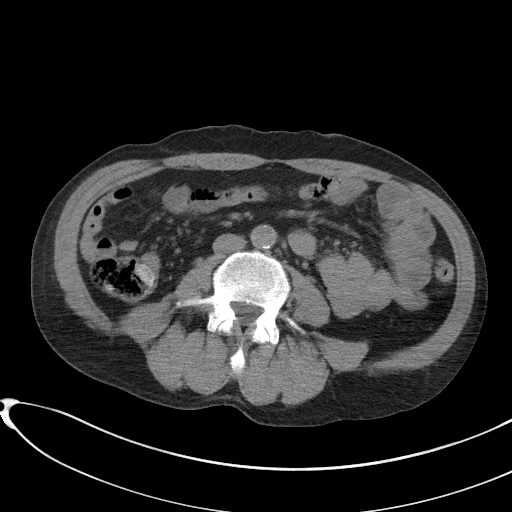
[im 54/96  soft-tissue]
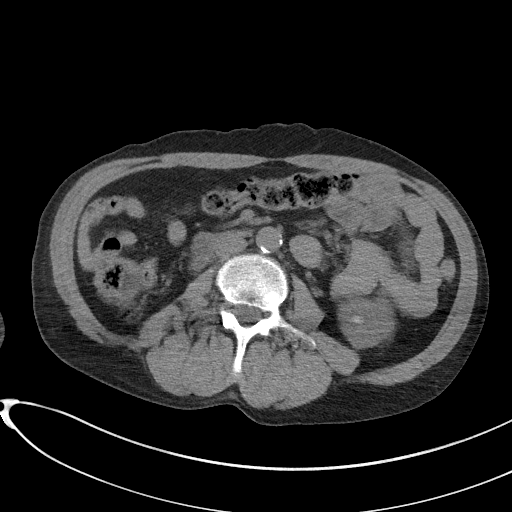
[im 61/96  soft-tissue]
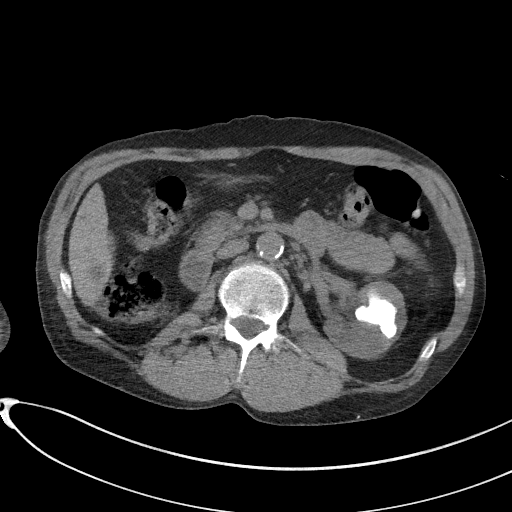
[im 61/96  bone]
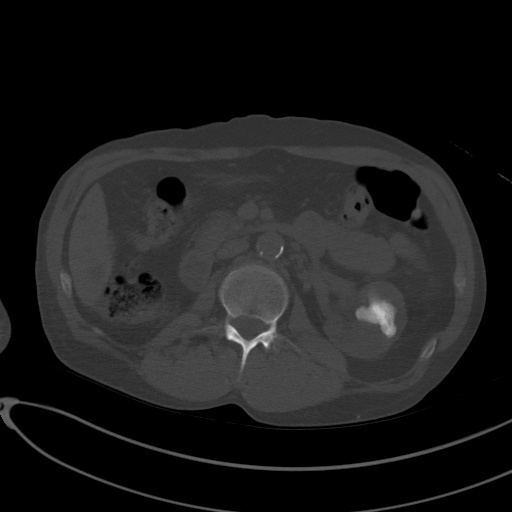
[im 69/96  soft-tissue]
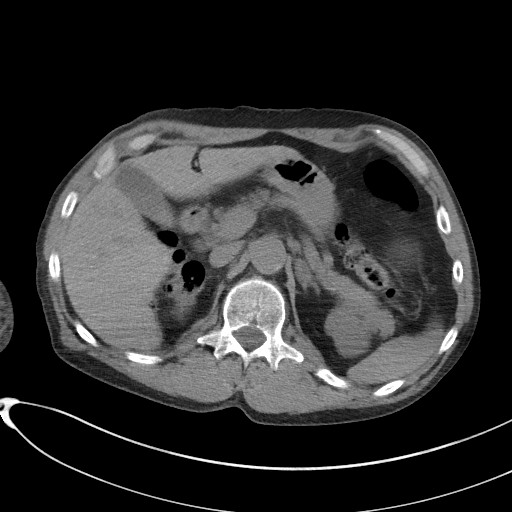
[im 77/96  soft-tissue]
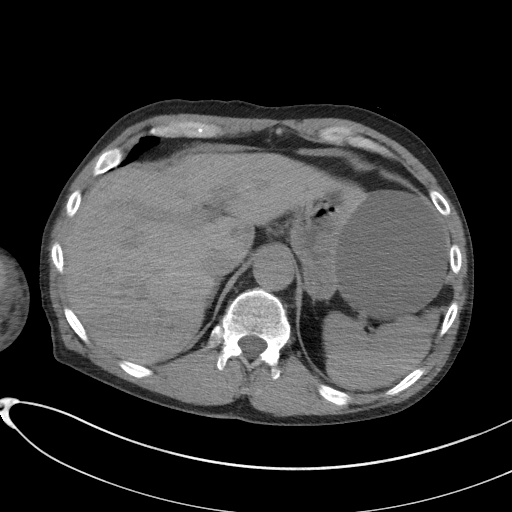
[im 84/96  soft-tissue]
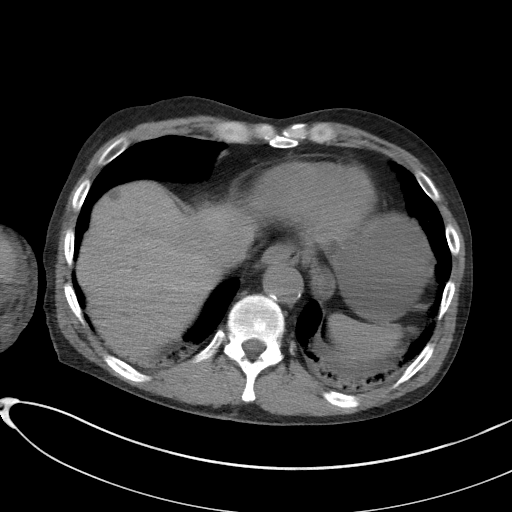
[im 92/96  soft-tissue]
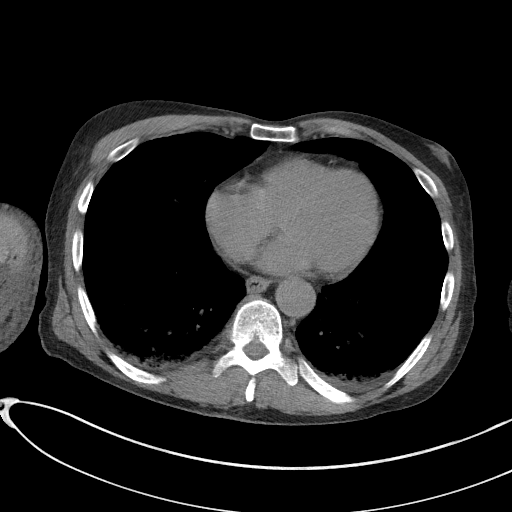

[Series 6: coronal · coronal · 0.85mm/px · 3 of 123 slices shown]
[im 41/123  soft-tissue]
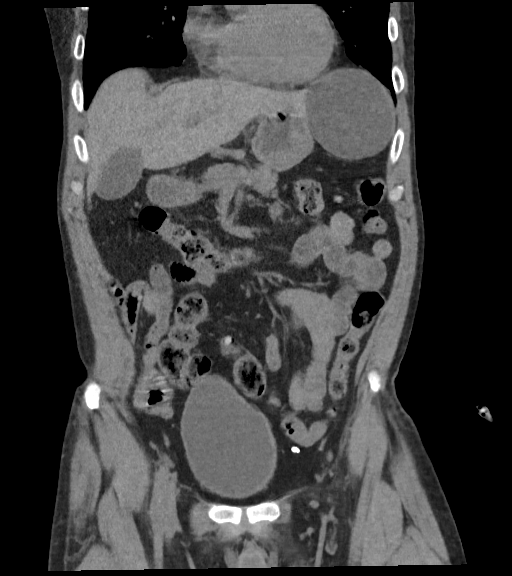
[im 55/123  soft-tissue]
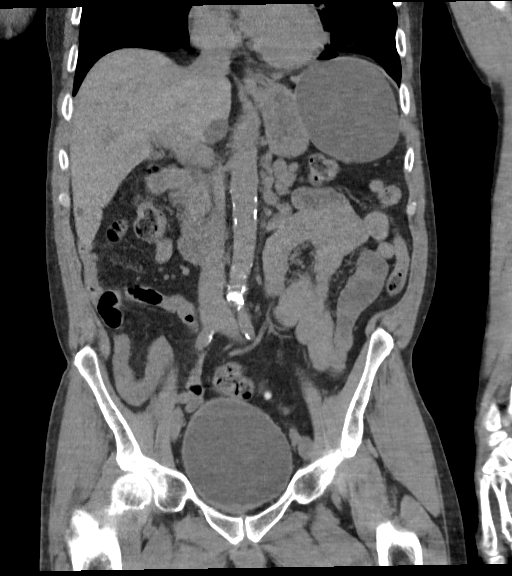
[im 68/123  soft-tissue]
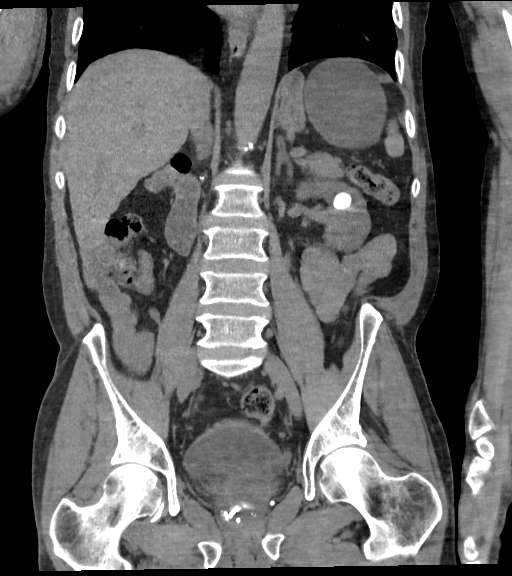

[16 of 46 positions shown; findings below may reference images not displayed]

FINDINGS: Lower chest: There has been progression of slight interstitial lung
disease at the bases posteriorly with tiny bilateral loculated
pleural effusions posteriorly at the bases. Heart size is normal.
Aortic atherosclerosis. Pericardial effusion.

Hepatobiliary: There multiple low-density well-defined lesions in
the liver consistent with benign appendix cysts. Several of these
have increased in size since the prior exam. The largest is 18 mm in
the posterior aspect of the inferior portion of the right lobe. No
worrisome lesions. Biliary tree is normal.

Pancreas: Unremarkable. No pancreatic ductal dilatation or
surrounding inflammatory changes.

Spleen: Normal in size without focal abnormality.

Adrenals/Urinary Tract: Right nephrectomy. Large staghorn calculus
in mid and upper pole of the left kidney. Lobulated 13 mm stone left
renal pelvis. No left hydronephrosis. The left ureter appears
normal. Bladder appears normal. No discrete bladder calculi.
Numerous calcifications in the prostate gland.

Stomach/Bowel: Stomach is within normal limits. Appendix has been
removed. No evidence of bowel wall thickening, distention, or
inflammatory changes. There are a few diverticula in the colon.

Vascular/Lymphatic: Aortic atherosclerosis. No enlarged abdominal or
pelvic lymph nodes.

Reproductive: Extensive benign calcifications in the prostate gland.

Other: No abdominal wall hernia or abnormality. No abdominopelvic
ascites.

Musculoskeletal: No acute or significant osseous findings.
IMPRESSION: 1. New large staghorn calculus in the left kidney as well as a 11 mm
stone in the left renal pelvis.
2. No hydronephrosis or other acute abnormality in the abdomen or
pelvis.
3.  Aortic Atherosclerosis (94039-Q44.4).
4. Progressive chronic interstitial lung disease at the bases
posteriorly with tiny loculated effusions at each base.

## 2021-03-14 IMAGING — CT CT HEAD WITHOUT CONTRAST
3 series · 16 of 47 positions shown, 19 images · non-contrast
Comparison: Head CT 01/17/2015 and earlier.

CLINICAL DATA: 63-year-old male with hypoglycemia, altered mental
status.

EXAM:
CT HEAD WITHOUT CONTRAST
TECHNIQUE: Contiguous axial images were obtained from the base of the skull
through the vertex without intravenous contrast.

[Series 2: head wo · axial · 0.44mm/px · z∈[-134,-4]mm · 10 of 32 slices shown, 13 images]
[im 3/32  brain]
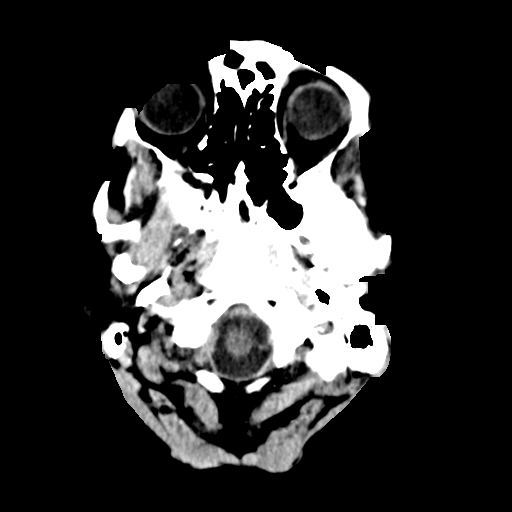
[im 3/32  bone]
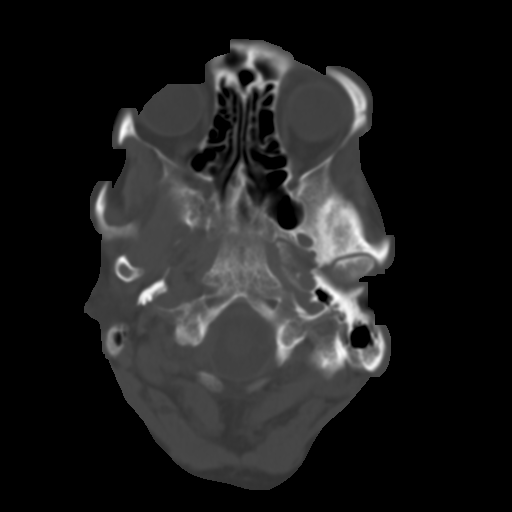
[im 6/32  brain]
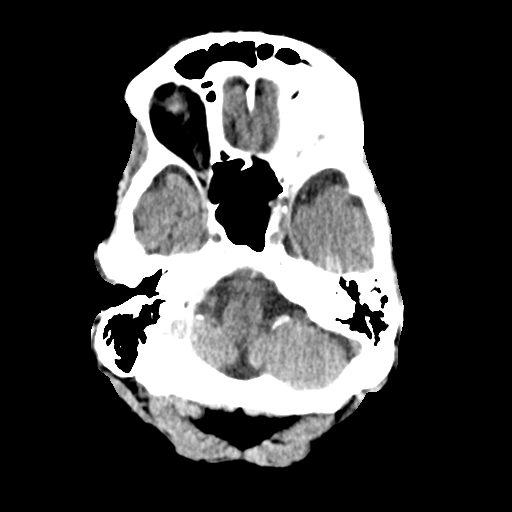
[im 9/32  brain]
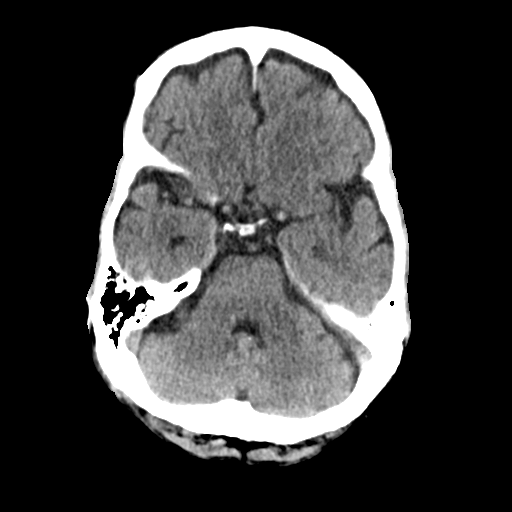
[im 11/32  brain]
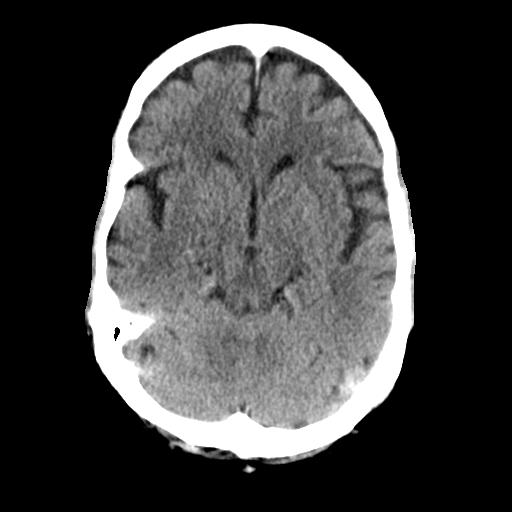
[im 14/32  brain]
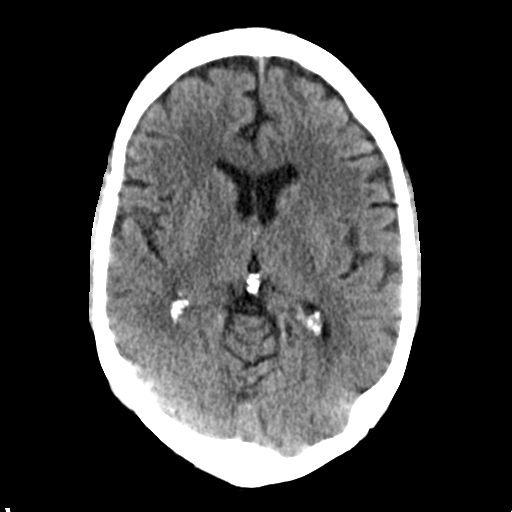
[im 14/32  bone]
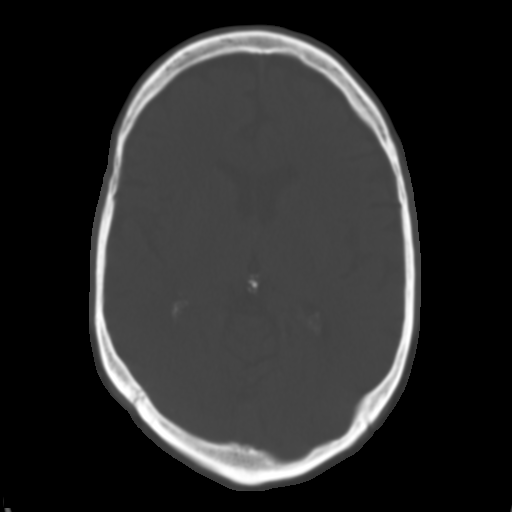
[im 18/32  brain]
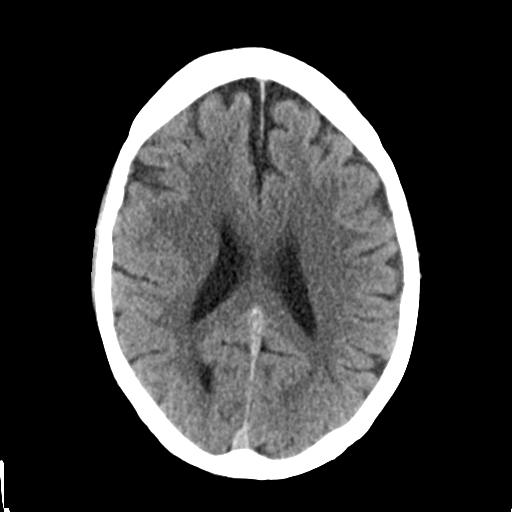
[im 21/32  brain]
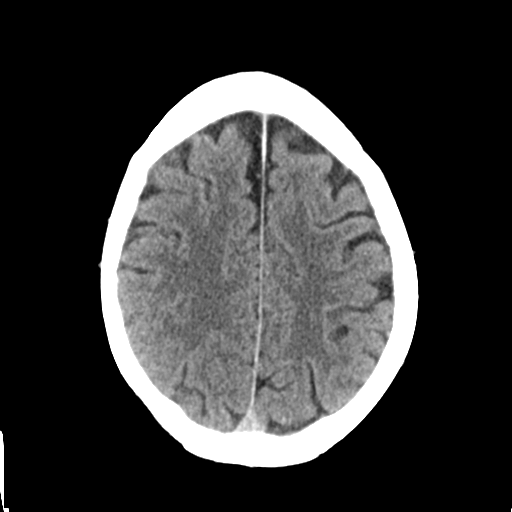
[im 24/32  brain]
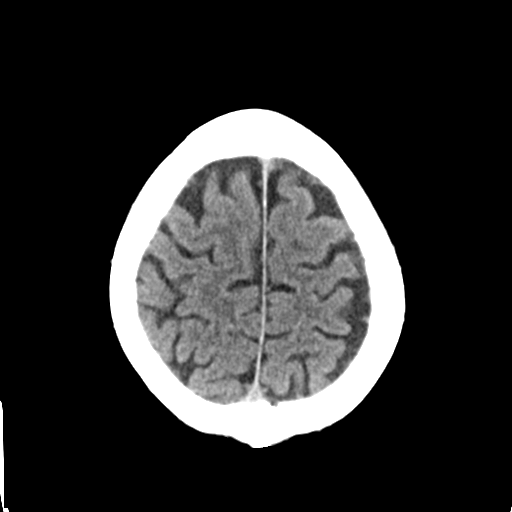
[im 26/32  brain]
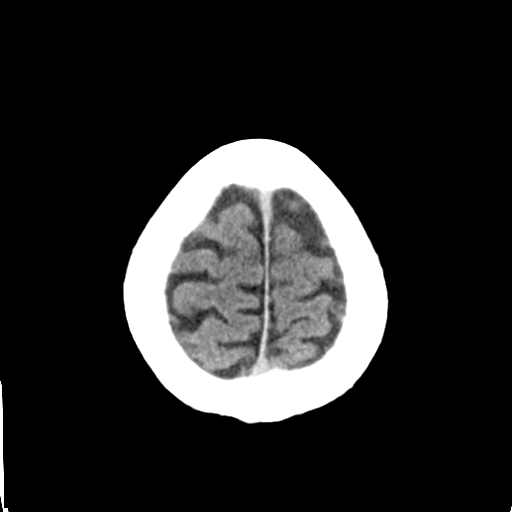
[im 26/32  bone]
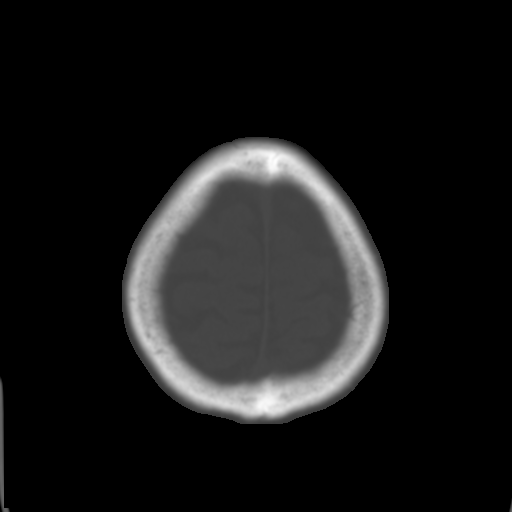
[im 29/32  brain]
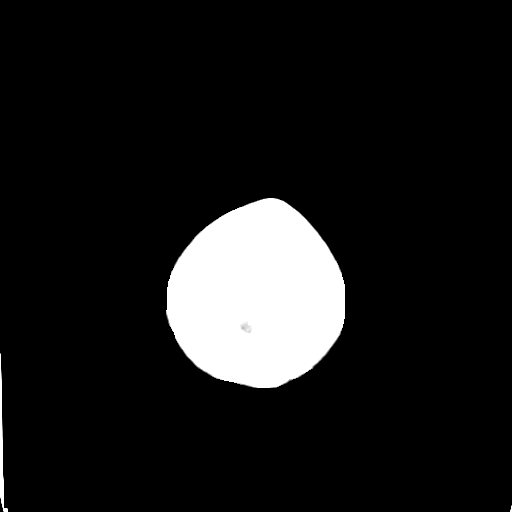

[Series 4: sagittal soft tissue · sagittal · 0.34mm/px · 3 of 59 slices shown]
[im 20/59  brain]
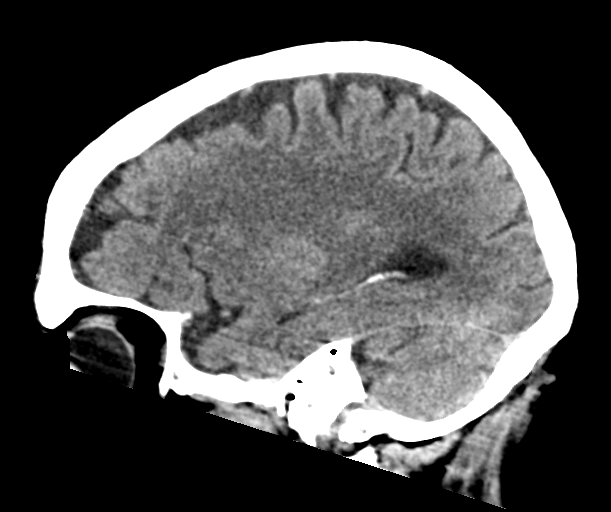
[im 30/59  brain]
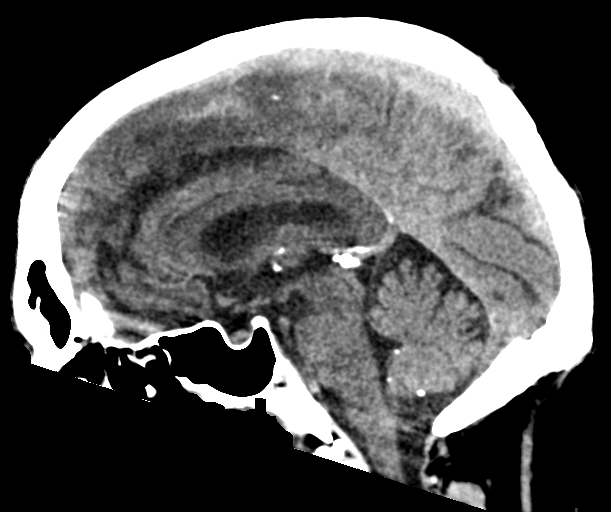
[im 39/59  brain]
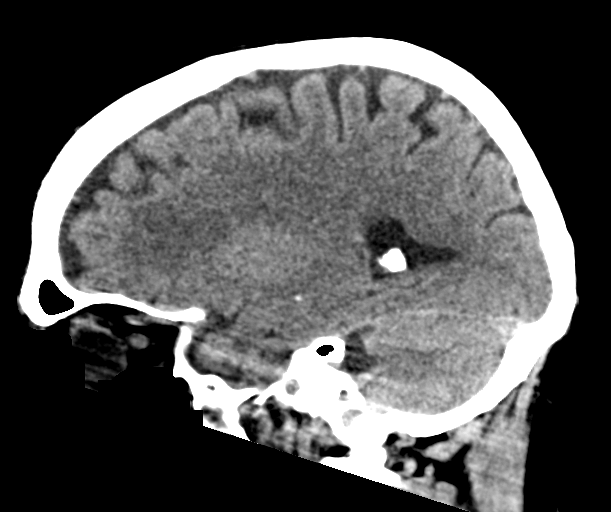

[Series 5: coronal soft tissue · coronal · 0.34mm/px · 3 of 71 slices shown]
[im 24/71  brain]
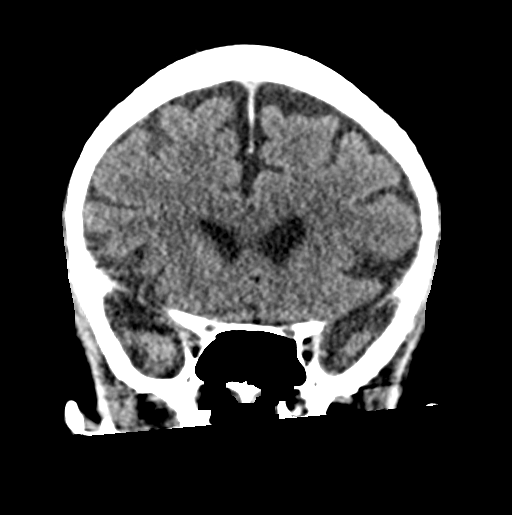
[im 32/71  brain]
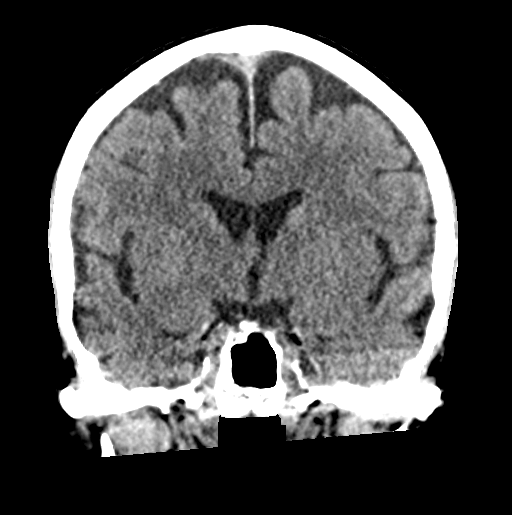
[im 39/71  brain]
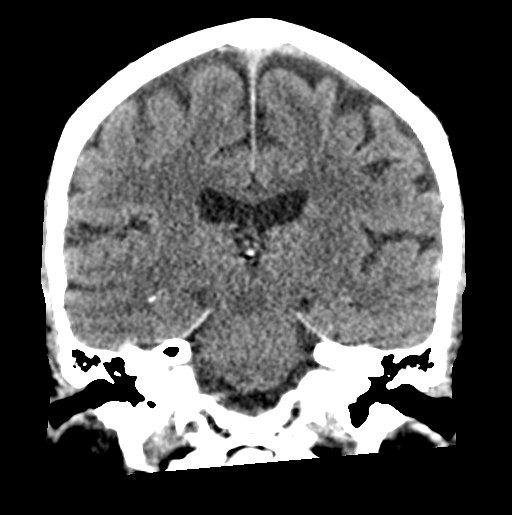

[16 of 47 positions shown; findings below may reference images not displayed]

FINDINGS: Brain: Mild generalized cerebral volume loss since 6479. No midline
shift, ventriculomegaly, mass effect, evidence of mass lesion,
intracranial hemorrhage or evidence of cortically based acute
infarction. Gray-white matter differentiation is within normal
limits throughout the brain.

Vascular: Mild Calcified atherosclerosis at the skull base. No
suspicious intracranial vascular hyperdensity.

Skull: Negative.

Sinuses/Orbits: Visualized paranasal sinuses and mastoids are stable
and well pneumatized.

Other: Negative orbit and scalp soft tissues.
IMPRESSION: Stable and normal for age noncontrast CT appearance of the brain.

## 2021-08-14 IMAGING — DX PORTABLE CHEST - 1 VIEW
1 series · 1 of 1 positions shown · non-contrast
Comparison: 02/27/2016

CLINICAL DATA: Sepsis

EXAM:
Chest portable one view

[chest ap]
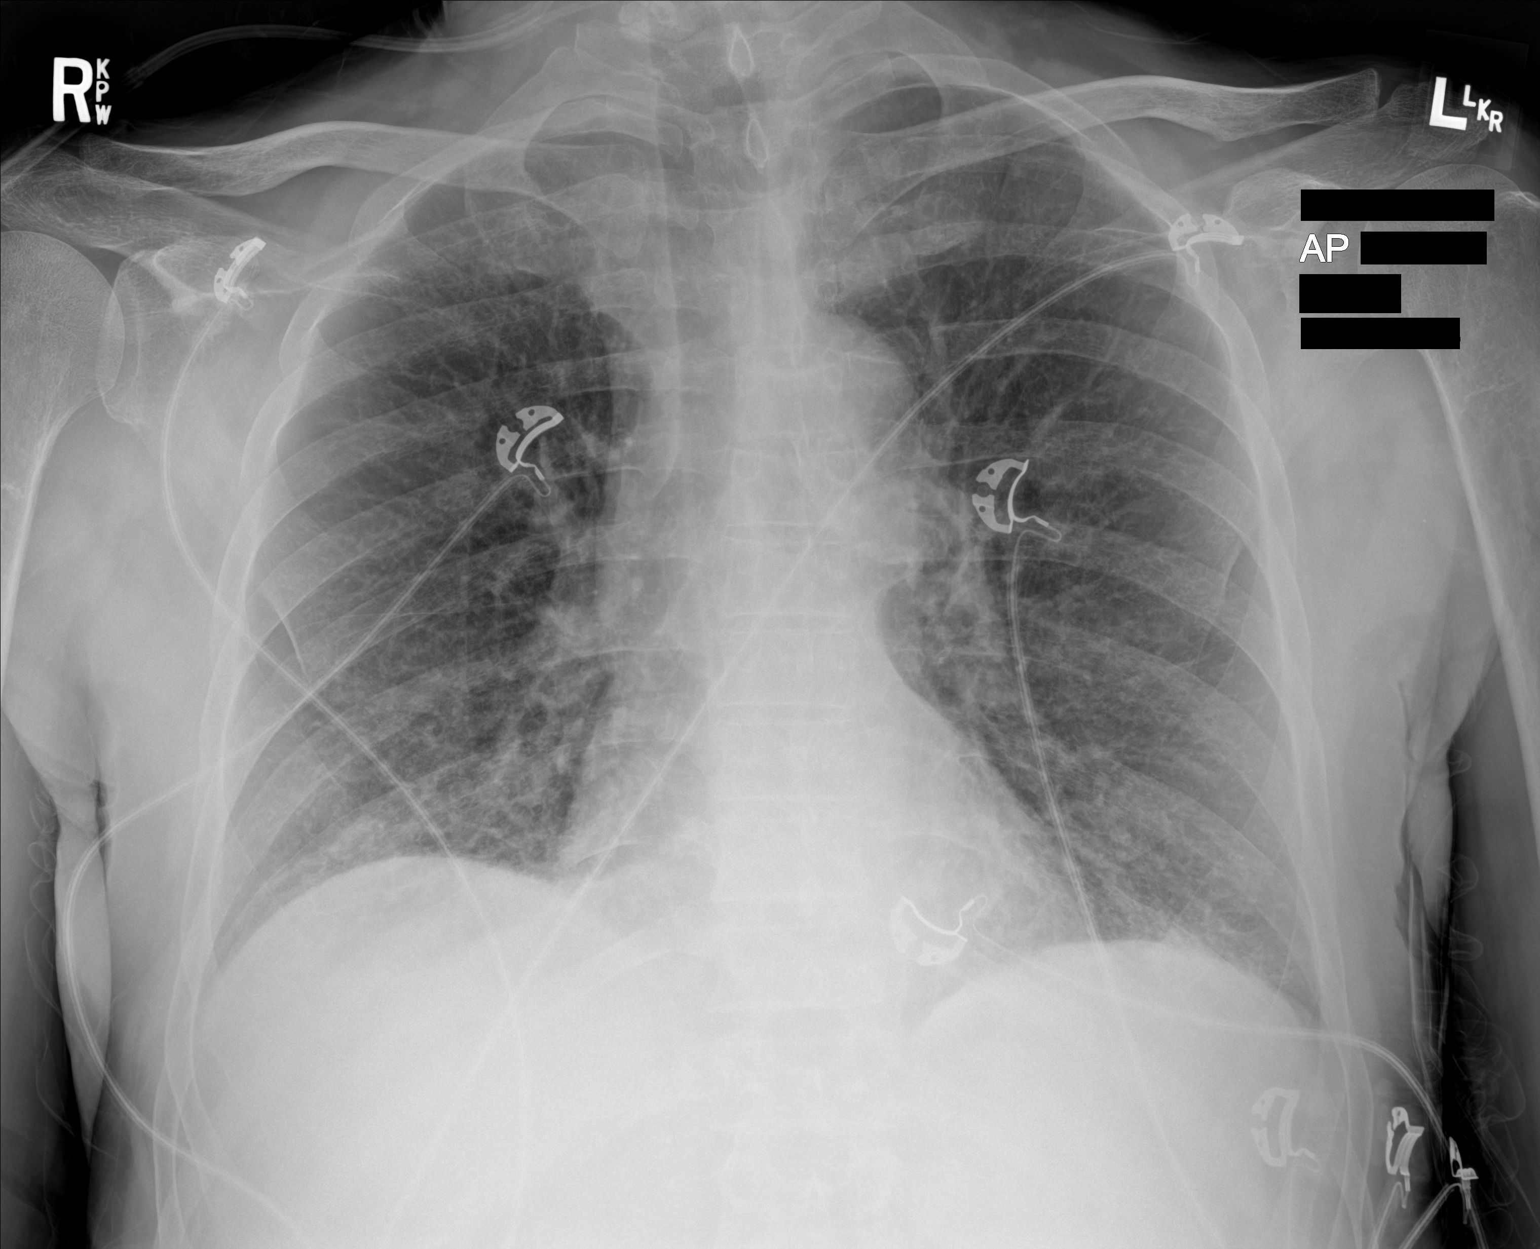

[1 of 1 positions shown; findings below may reference images not displayed]

FINDINGS: Heart is normal size. Interstitial prominence throughout the lungs,
increased since prior study. Increased markings in the lung bases,
likely atelectasis or scarring. No effusions or acute bony
abnormality.
IMPRESSION: Increasing interstitial prominence within the lungs, favor chronic
interstitial lung disease. Bibasilar atelectasis or scarring.

## 2021-08-15 IMAGING — US US RENAL
1 series · 14 of 25 positions shown · non-contrast
Comparison: None.

CLINICAL DATA: Acute renal failure.

EXAM:
RENAL / URINARY TRACT ULTRASOUND COMPLETE

[Series 1: us renal · 14 of 25 slices shown]
[im 1/25]
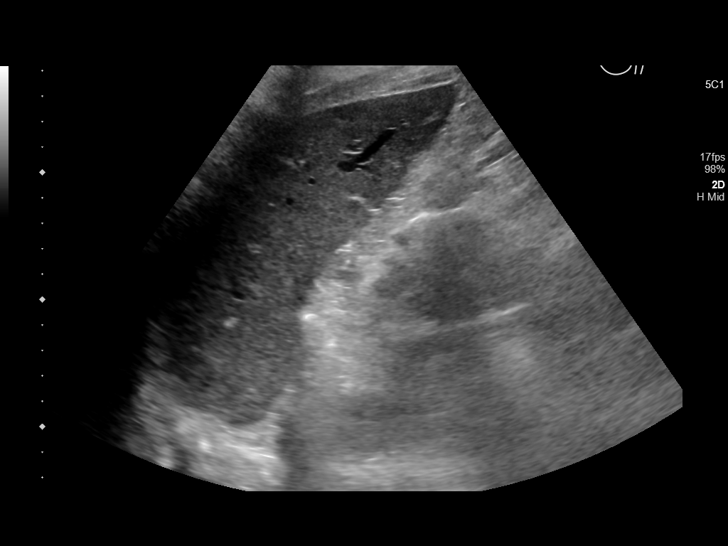
[im 3/25]
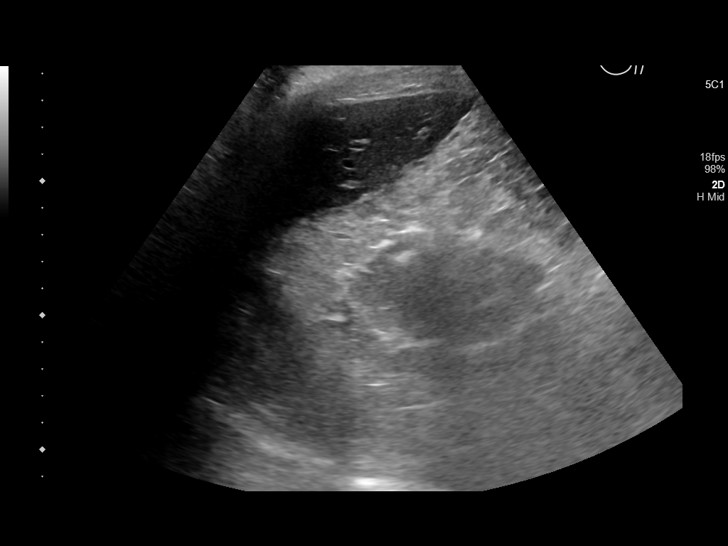
[im 5/25]
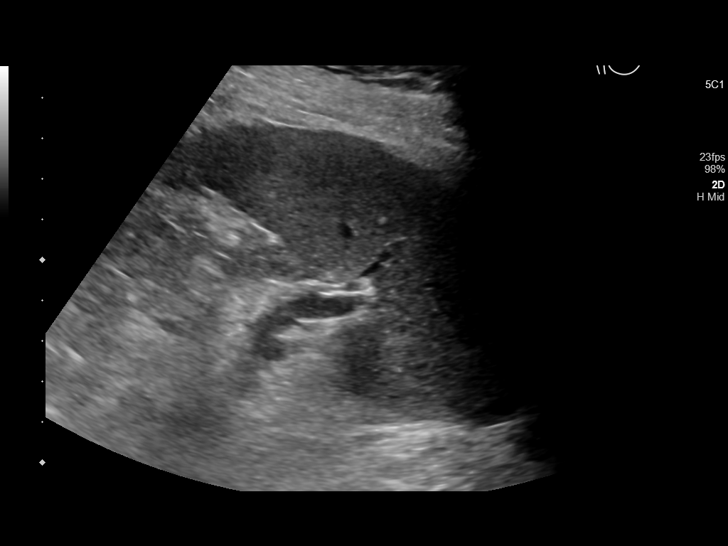
[im 7/25]
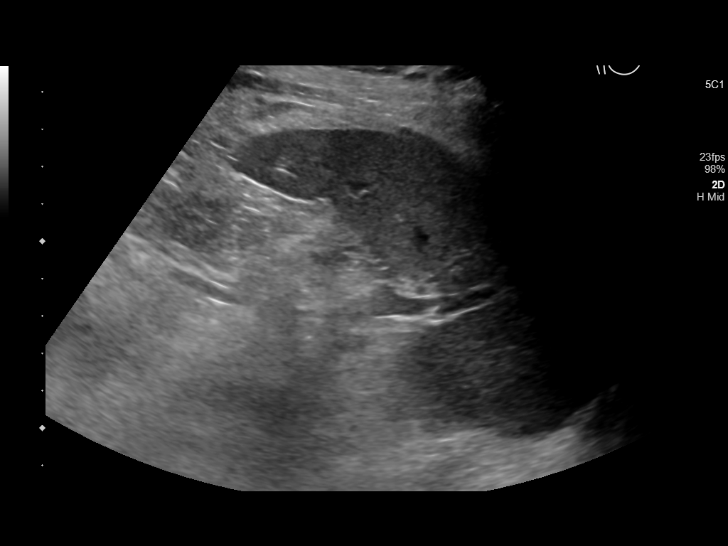
[im 9/25]
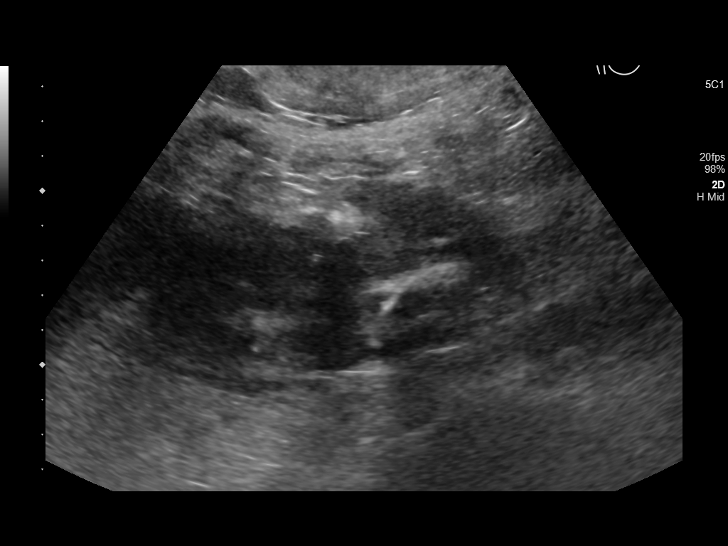
[im 10/25]
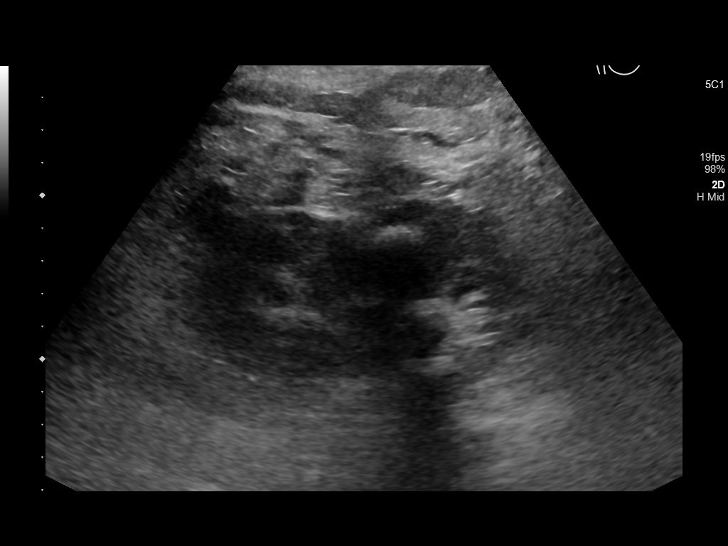
[im 12/25]
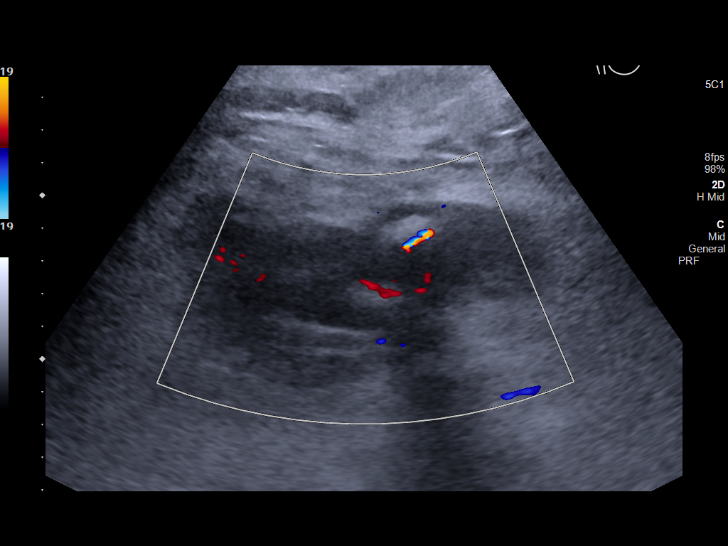
[im 14/25]
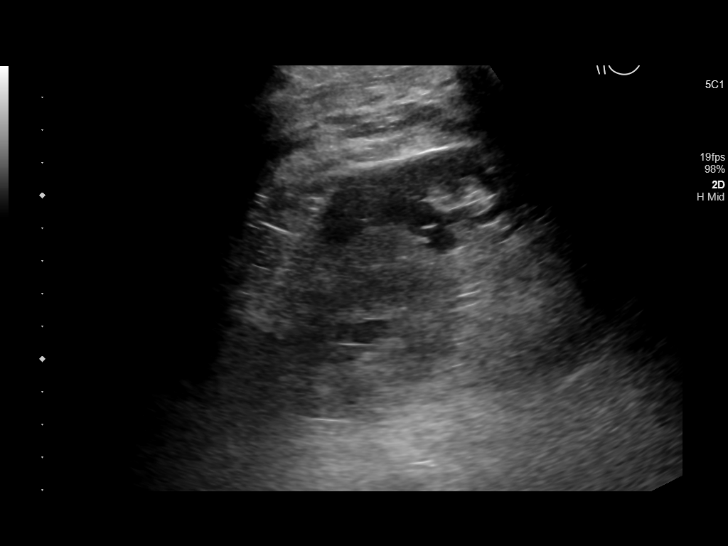
[im 16/25]
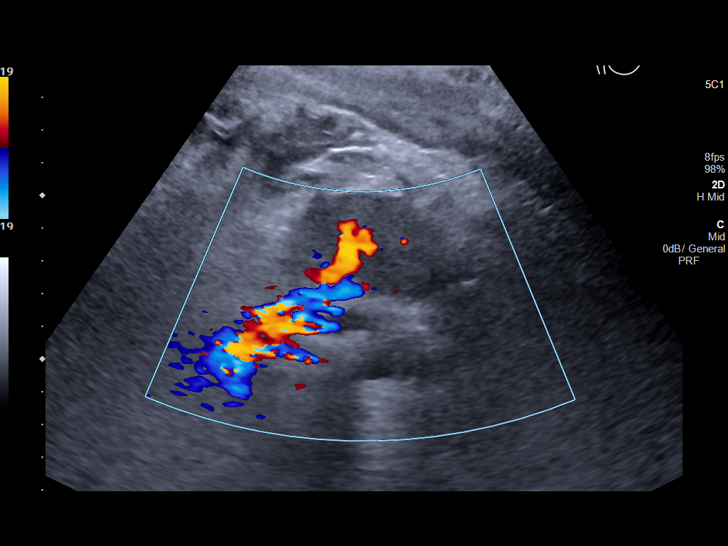
[im 17/25]
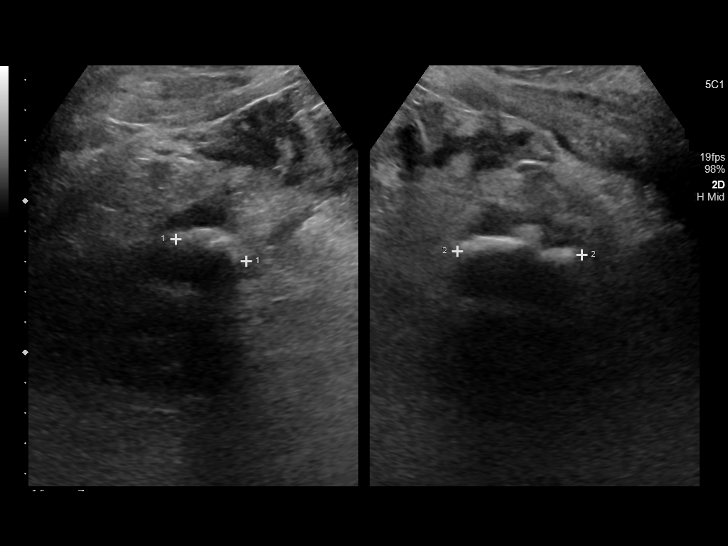
[im 19/25]
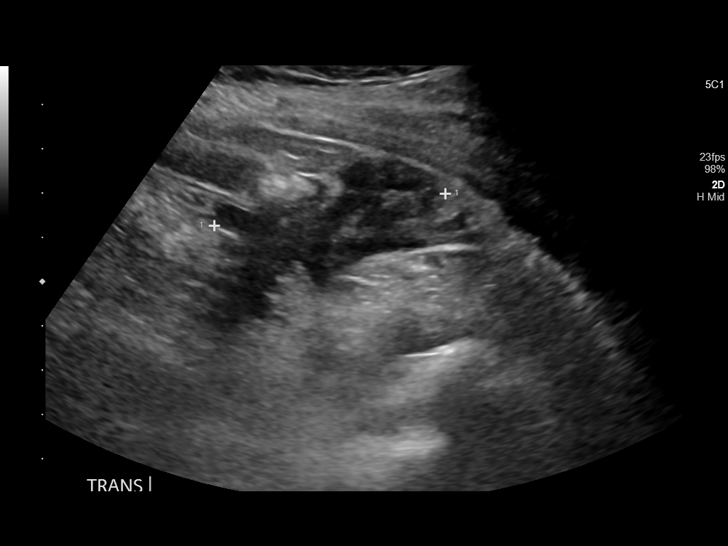
[im 21/25]
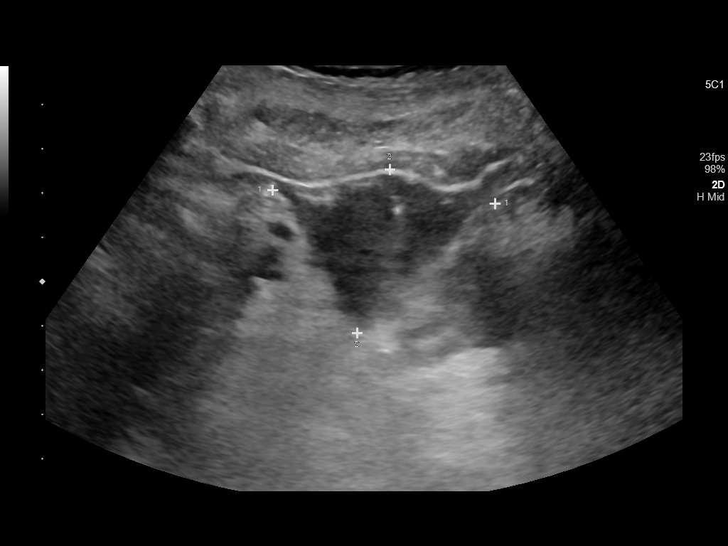
[im 23/25]
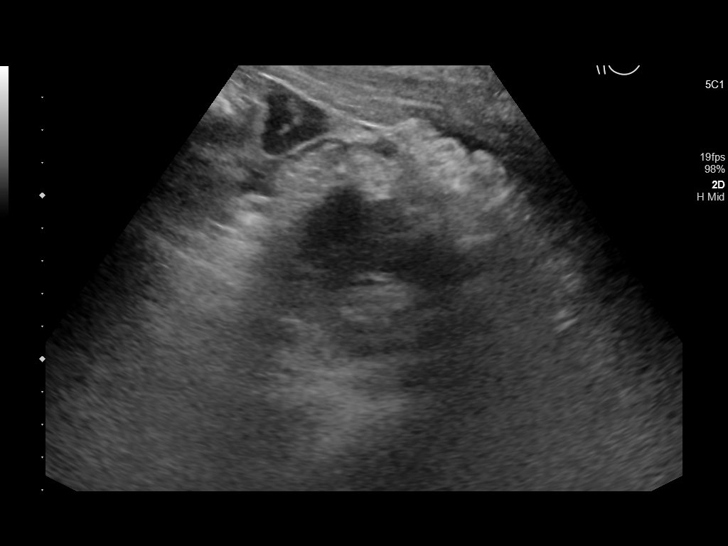
[im 25/25]
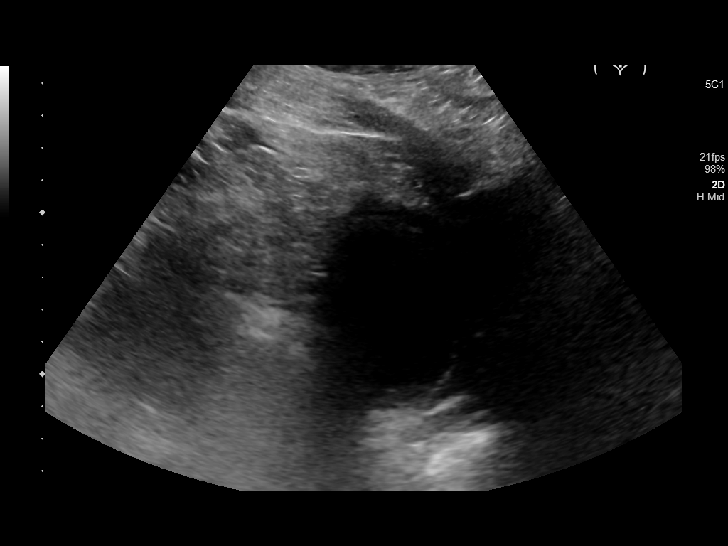

[14 of 25 positions shown; findings below may reference images not displayed]

FINDINGS: Right Kidney:

The patient is status post right nephrectomy.

Left Kidney:

Renal measurements: 11.0 x 5.6 x 6.1 cm = volume: 200 mL. Two large
stones are seen in the left kidney with the largest measuring
cm. Mild hydronephrosis is identified, age indeterminate.

Bladder:

Appears normal for degree of bladder distention.
IMPRESSION: 1. Two large stones are seen in the left kidney with the largest
measuring 4.1 cm. There is mild hydronephrosis on the left was is
age indeterminate. Recommend clinical correlation.

## 2021-12-03 IMAGING — DX DG CHEST 1V PORT
1 series · 1 of 1 positions shown · non-contrast
Comparison: 01/22/2019

CLINICAL DATA: Dyspnea.

EXAM:
PORTABLE CHEST 1 VIEW

[chest ap]
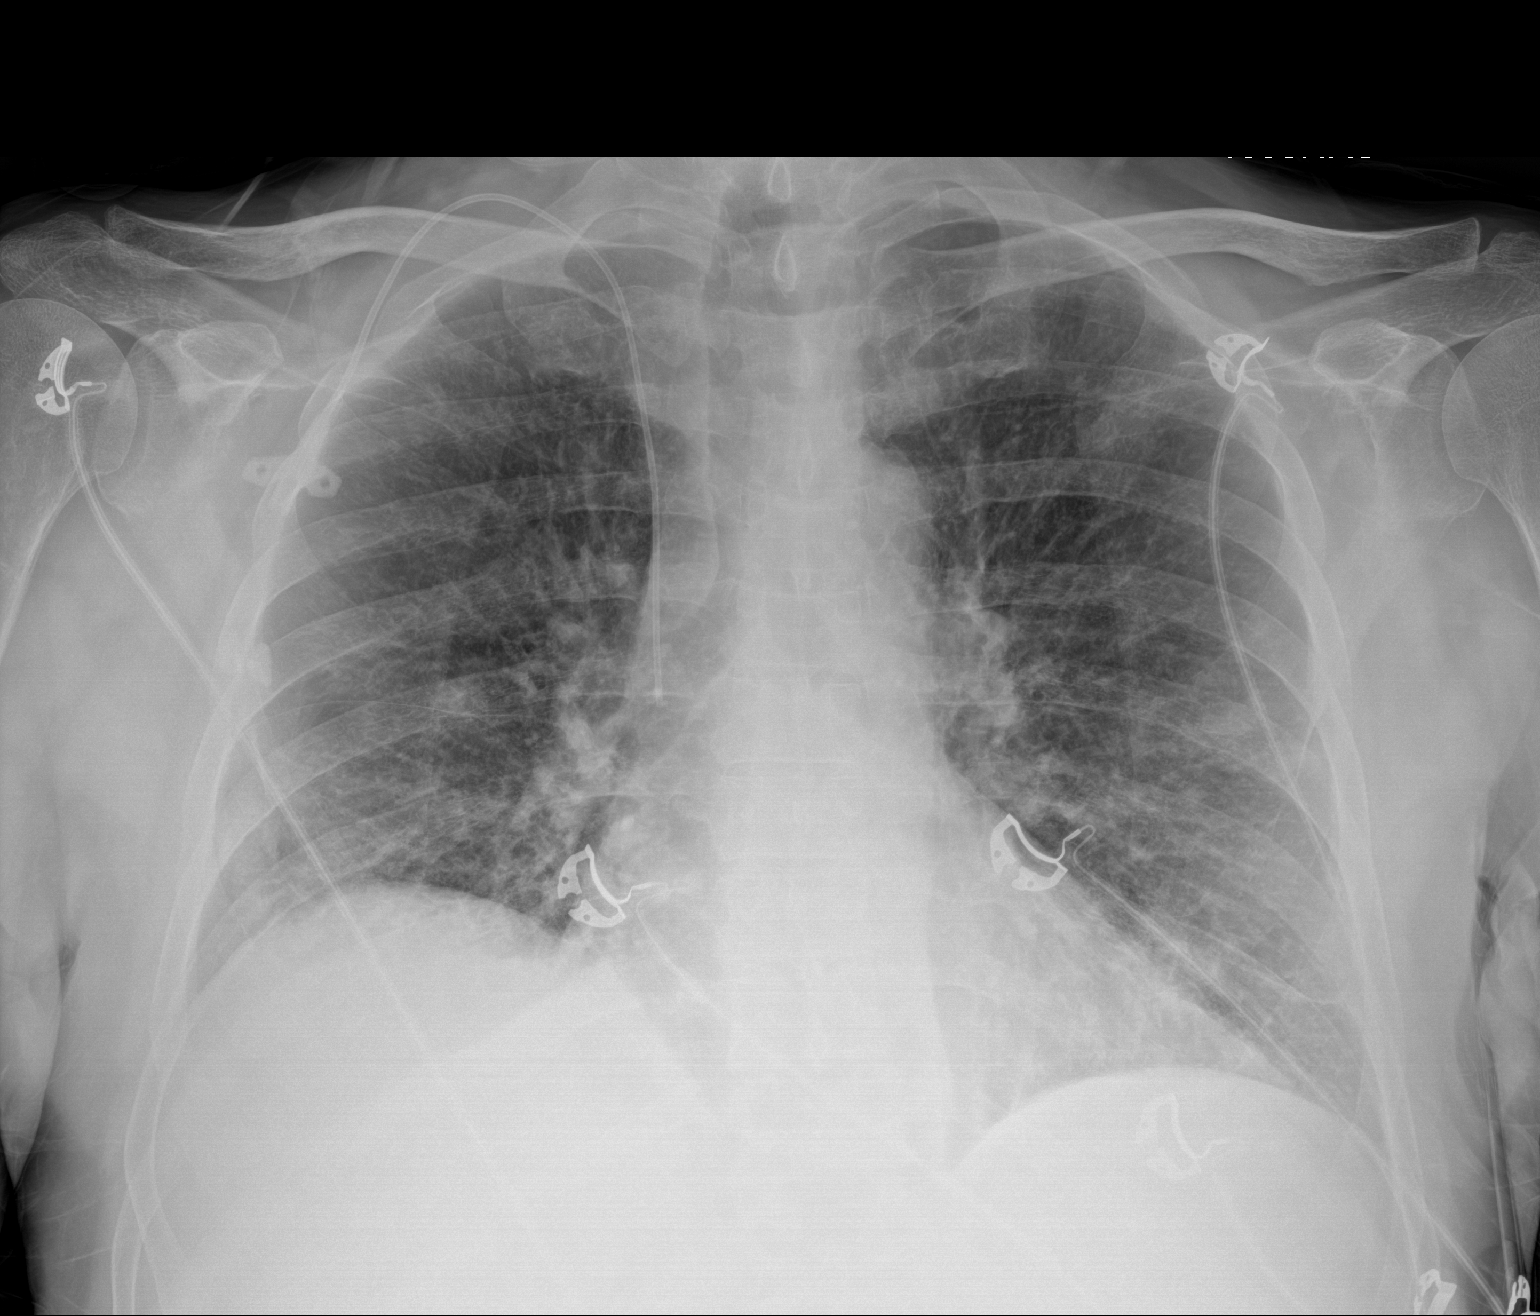

[1 of 1 positions shown; findings below may reference images not displayed]

FINDINGS: Right IJ Port-A-Cath with tip over the SVC. Lungs are hypoinflated
with mild prominence of the central pulmonary vessels suggesting
mild vascular congestion. No lobar consolidation or effusion. There
is subtle patchy density in the mid lungs as could not exclude
possible underlying infection. No pneumothorax. Cardiomediastinal
silhouette and remainder of the exam is unchanged.
IMPRESSION: Findings suggesting mild vascular congestion. Subtle patchy density
in the mid lungs which could represent underlying infection.

Right IJ Port-A-Cath with tip over the SVC.

## 2021-12-03 IMAGING — CT CT RENAL STONE PROTOCOL
3 of 4 series · 9 of 46 positions shown, 16 images · non-contrast
Comparison: CT of the abdomen and pelvis without contrast [DATE]/.

CLINICAL DATA: Flank pain. Recurrent disease suspected.

EXAM:
CT ABDOMEN AND PELVIS WITHOUT CONTRAST
TECHNIQUE: Multidetector CT imaging of the abdomen and pelvis was performed
following the standard protocol without IV contrast.

[Series 4: lung bases · axial · 0.71mm/px · z∈[-22,+73]mm · 5 of 29 slices shown, 10 images]
[im 5/29  soft-tissue]
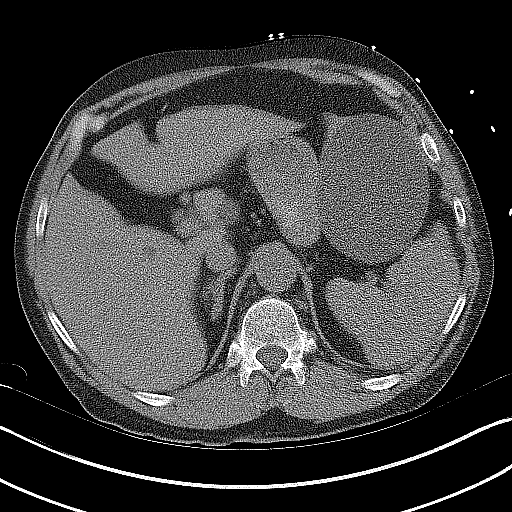
[im 5/29  bone]
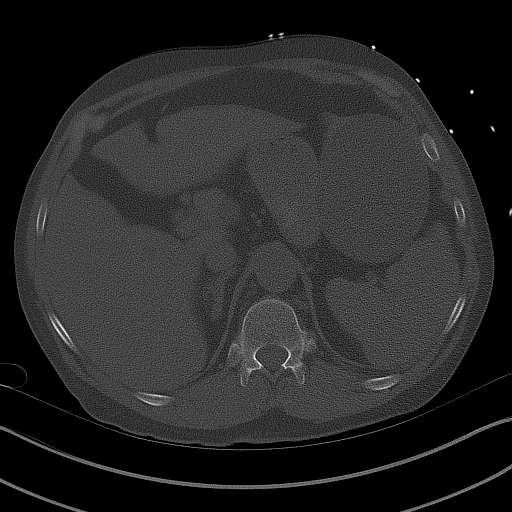
[im 10/29  soft-tissue]
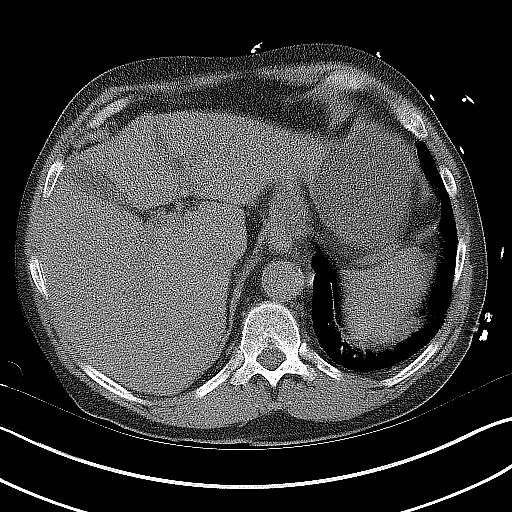
[im 10/29  lung]
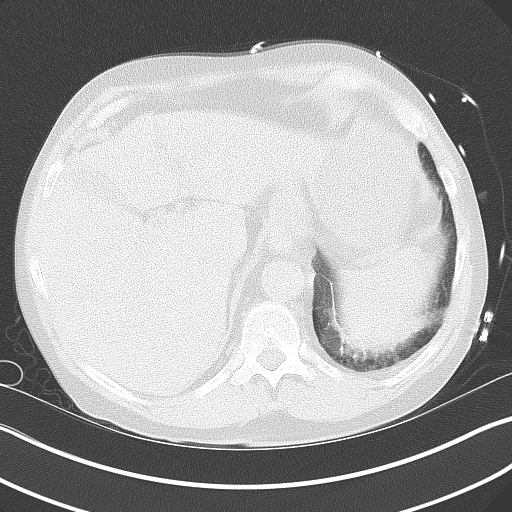
[im 15/29  soft-tissue]
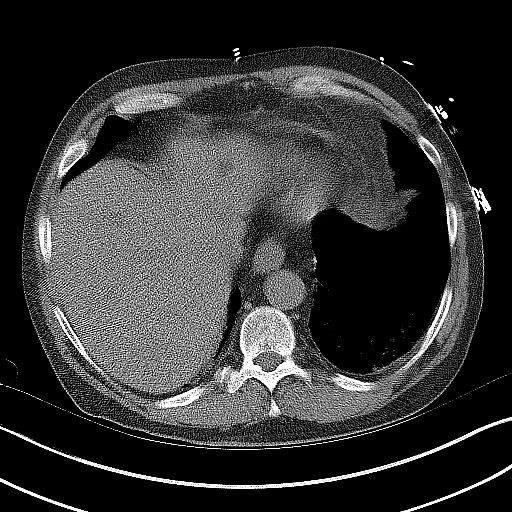
[im 15/29  lung]
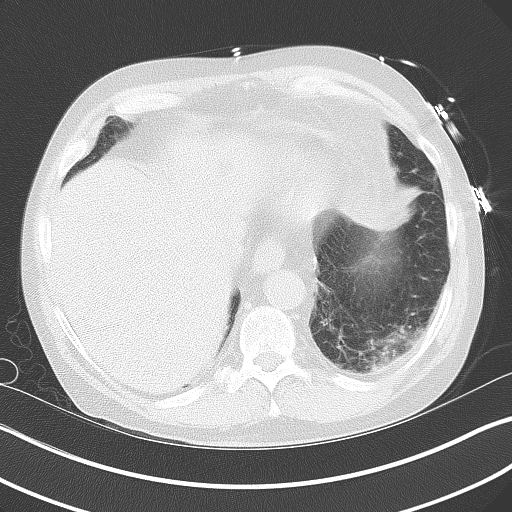
[im 19/29  soft-tissue]
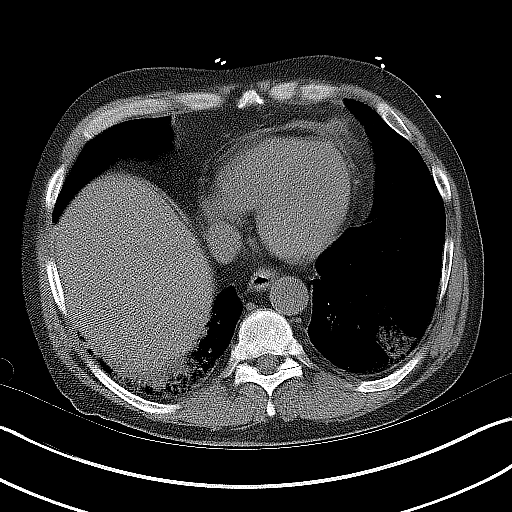
[im 19/29  lung]
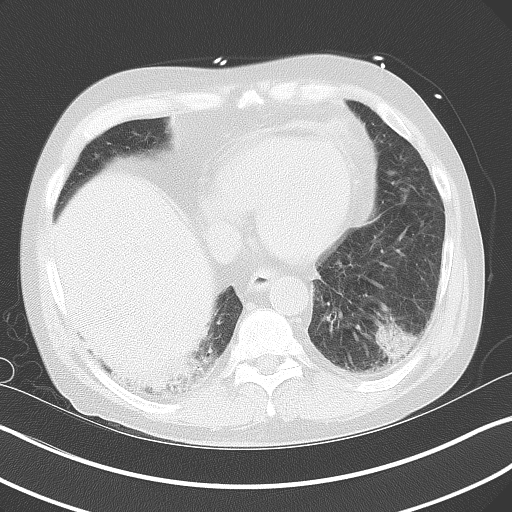
[im 24/29  soft-tissue]
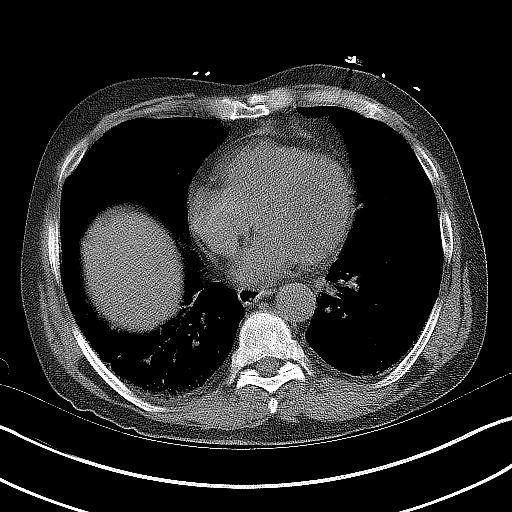
[im 24/29  lung]
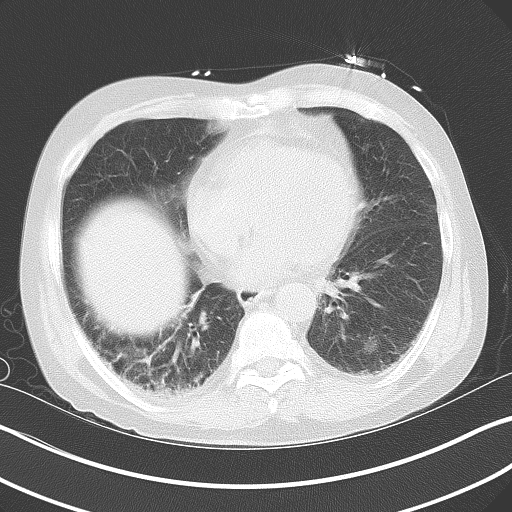

[Series 5: coronal · coronal · 0.75mm/px · 3 of 152 slices shown, 4 images]
[im 51/152  soft-tissue]
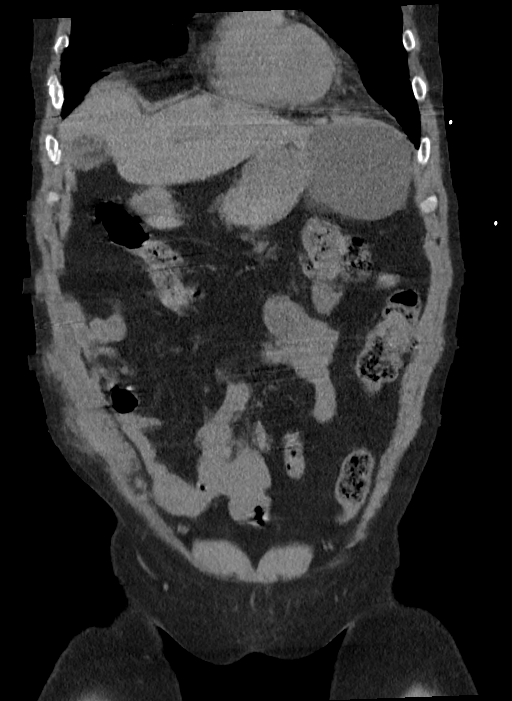
[im 68/152  soft-tissue]
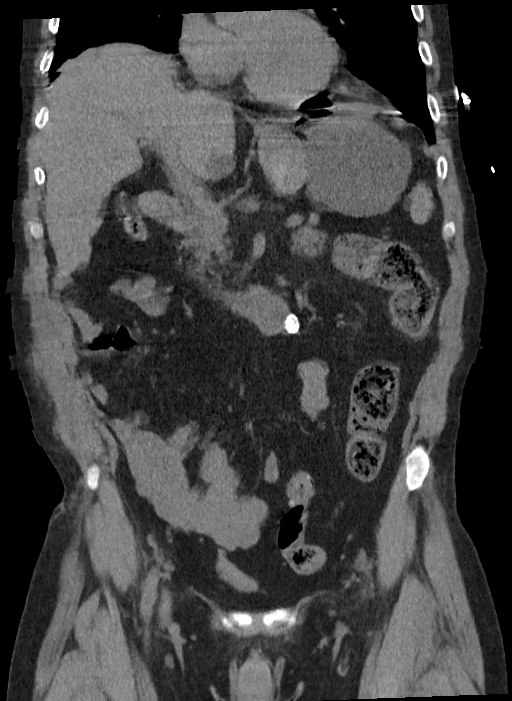
[im 68/152  bone]
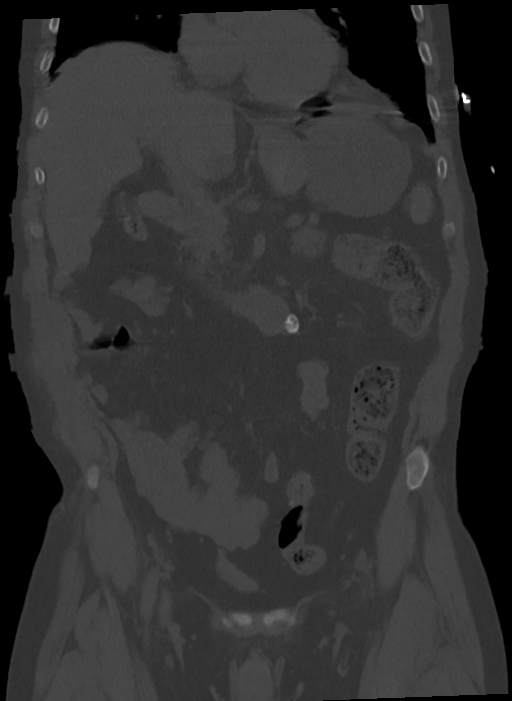
[im 84/152  soft-tissue]
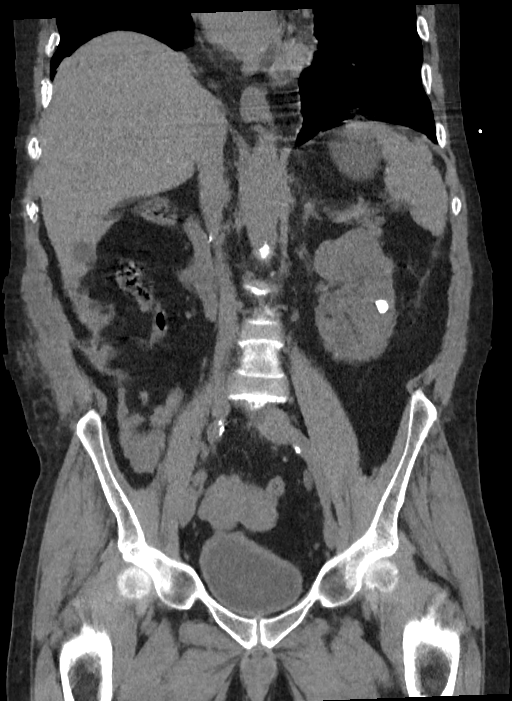

[Series 6: sagittal · sagittal · 0.60mm/px · 1 of 192 slices shown, 2 images]
[im 64/192  soft-tissue]
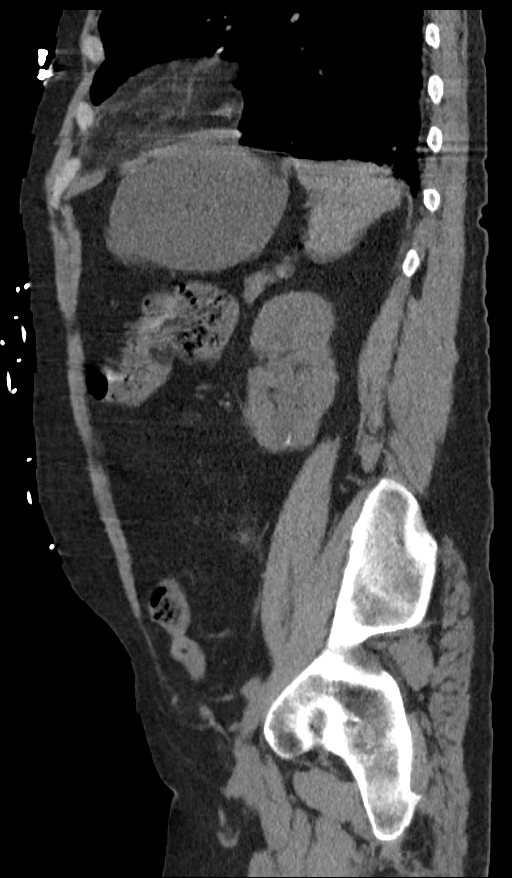
[im 64/192  bone]
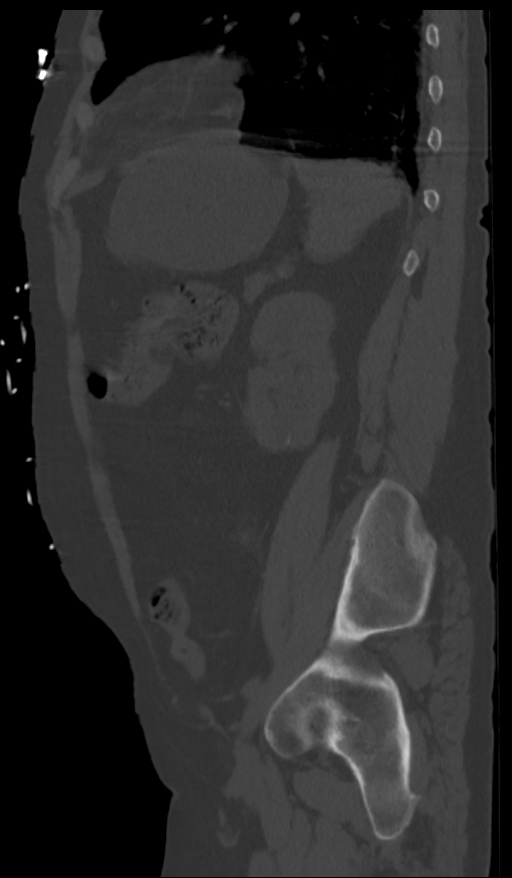

[9 of 46 positions shown; findings below may reference images not displayed]

FINDINGS: Lower chest: Patchy airspace opacities are present in the lower
lobes bilaterally. Heart size is normal. No significant pleural or
pericardial effusion present.

Hepatobiliary: Hepatic cysts are stable. No new lesions are present.
The largest lesion laterally in inferior right lower lobe measuring
18 mm. The common bile duct and gallbladder are normal.

Pancreas: Unremarkable. No pancreatic ductal dilatation or
surrounding inflammatory changes.

Spleen: Normal in size without focal abnormality.

Adrenals/Urinary Tract: Adrenal glands are normal bilaterally. Right
nephrectomy noted.

Prominent staghorn calculus seen on the prior study is near
completely resolved. Prominent parenchymal calcification in the
midportion of the left kidney measures 11 mm. Punctate calcification
near the lower pole of the left kidney is stable.

Mild prominence of the left ureter present above the level of a
partially obstructing 5 mm stone at pelvic inlet. More distal ureter
is normal. The urinary bladder is within limits.

Stomach/Bowel: Stomach and duodenum are within normal limits. Bowel
is unremarkable. Terminal ileum is normal. The ascending and
transverse colon are within normal limits. Descending and sigmoid
colon are normal.

Vascular/Lymphatic: Atherosclerotic calcifications are present in
the aorta and branch vessels without aneurysm.

Reproductive: Dense calcifications are present prostate gland. No
discrete lesion is present.

Other: Choose 1

Musculoskeletal: No acute or significant osseous findings.
IMPRESSION: 1. Partially obstructing 5 mm stone at the left pelvic inlet.
2. Mild prominence of the left ureter above the level the stone.
3. Marked reduction in stone burden in the left kidney.
4. Right nephrectomy.
5. Stable hepatic cysts.
6. Aortic Atherosclerosis ([id number]).
7. Patchy airspace disease the lung bases bilaterally raises concern
for infection.

## 2021-12-03 IMAGING — DX DG CHEST 1V PORT
2 series · 2 of 2 positions shown · non-contrast
Comparison: Portable exam 9326 hours compared to 6655 hours

CLINICAL DATA: Respiratory failure

EXAM:
PORTABLE CHEST 1 VIEW

[chest ap (1 of 2)]
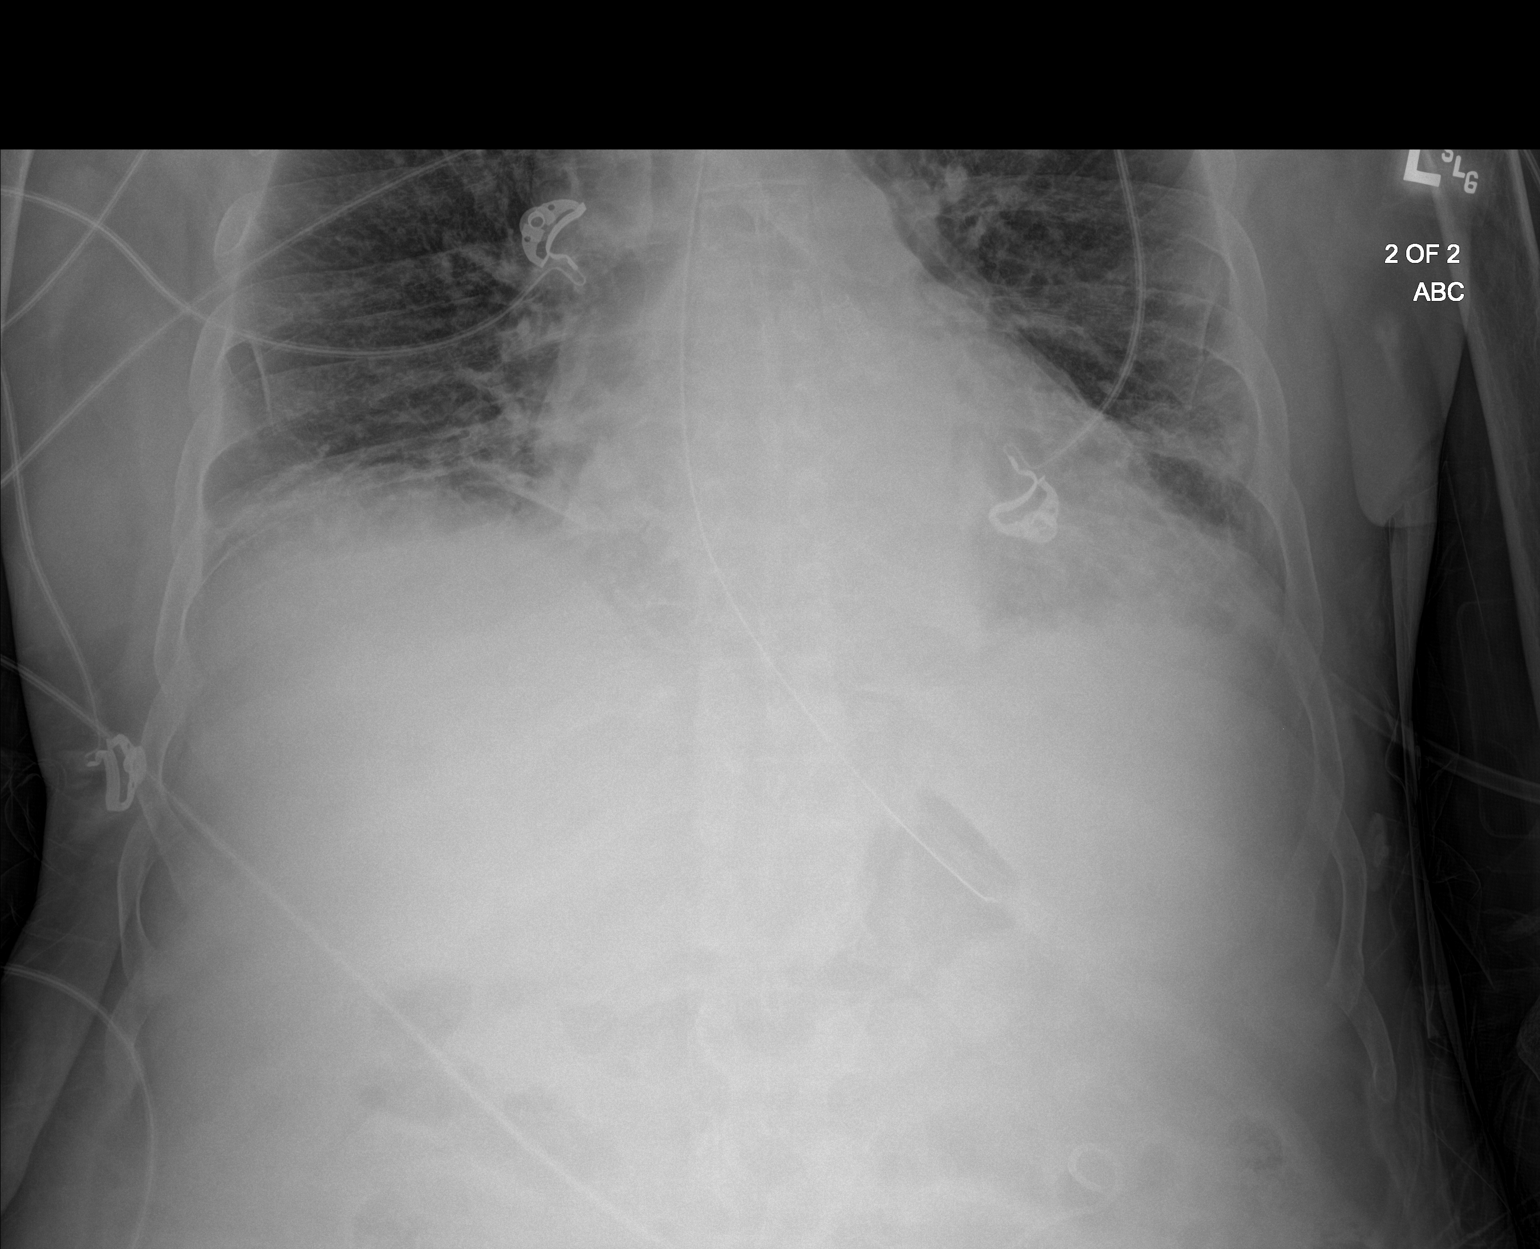

[chest ap (2 of 2)]
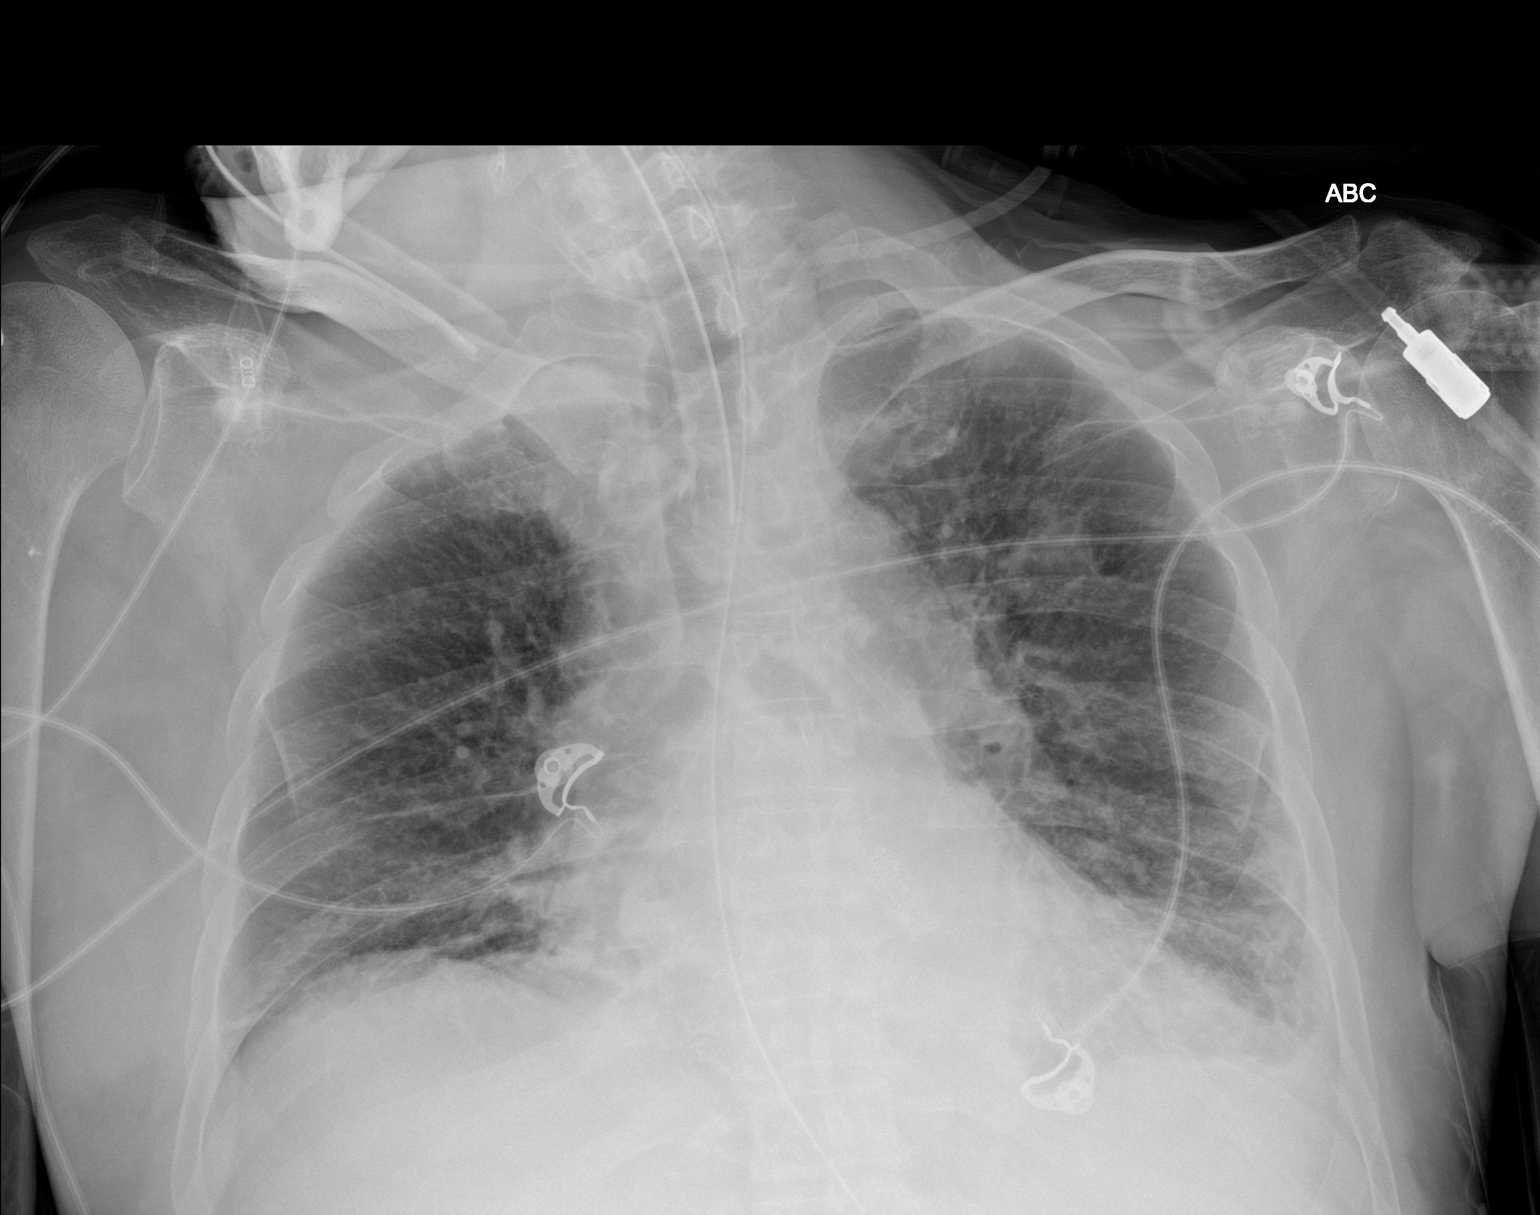

[2 of 2 positions shown; findings below may reference images not displayed]

FINDINGS: Tip of endotracheal tube projects 4.1 cm above carina.

Nasogastric tube extends into stomach.

Normal heart size, mediastinal contours, and pulmonary vascularity.

Bibasilar atelectasis and small LEFT pleural effusion.

Diffuse accentuation of interstitial markings, could represent edema
or infection.

No pneumothorax or acute osseous findings.
IMPRESSION: Bibasilar atelectasis and small LEFT pleural effusion with scattered
interstitial prominence throughout both lungs question minimal
pulmonary edema or infection.

## 2021-12-04 IMAGING — DX DG CHEST 1V PORT
1 series · 1 of 1 positions shown · non-contrast
Comparison: Radiograph earlier this day.

CLINICAL DATA: Central line placement.

EXAM:
PORTABLE CHEST 1 VIEW

[chest ap]
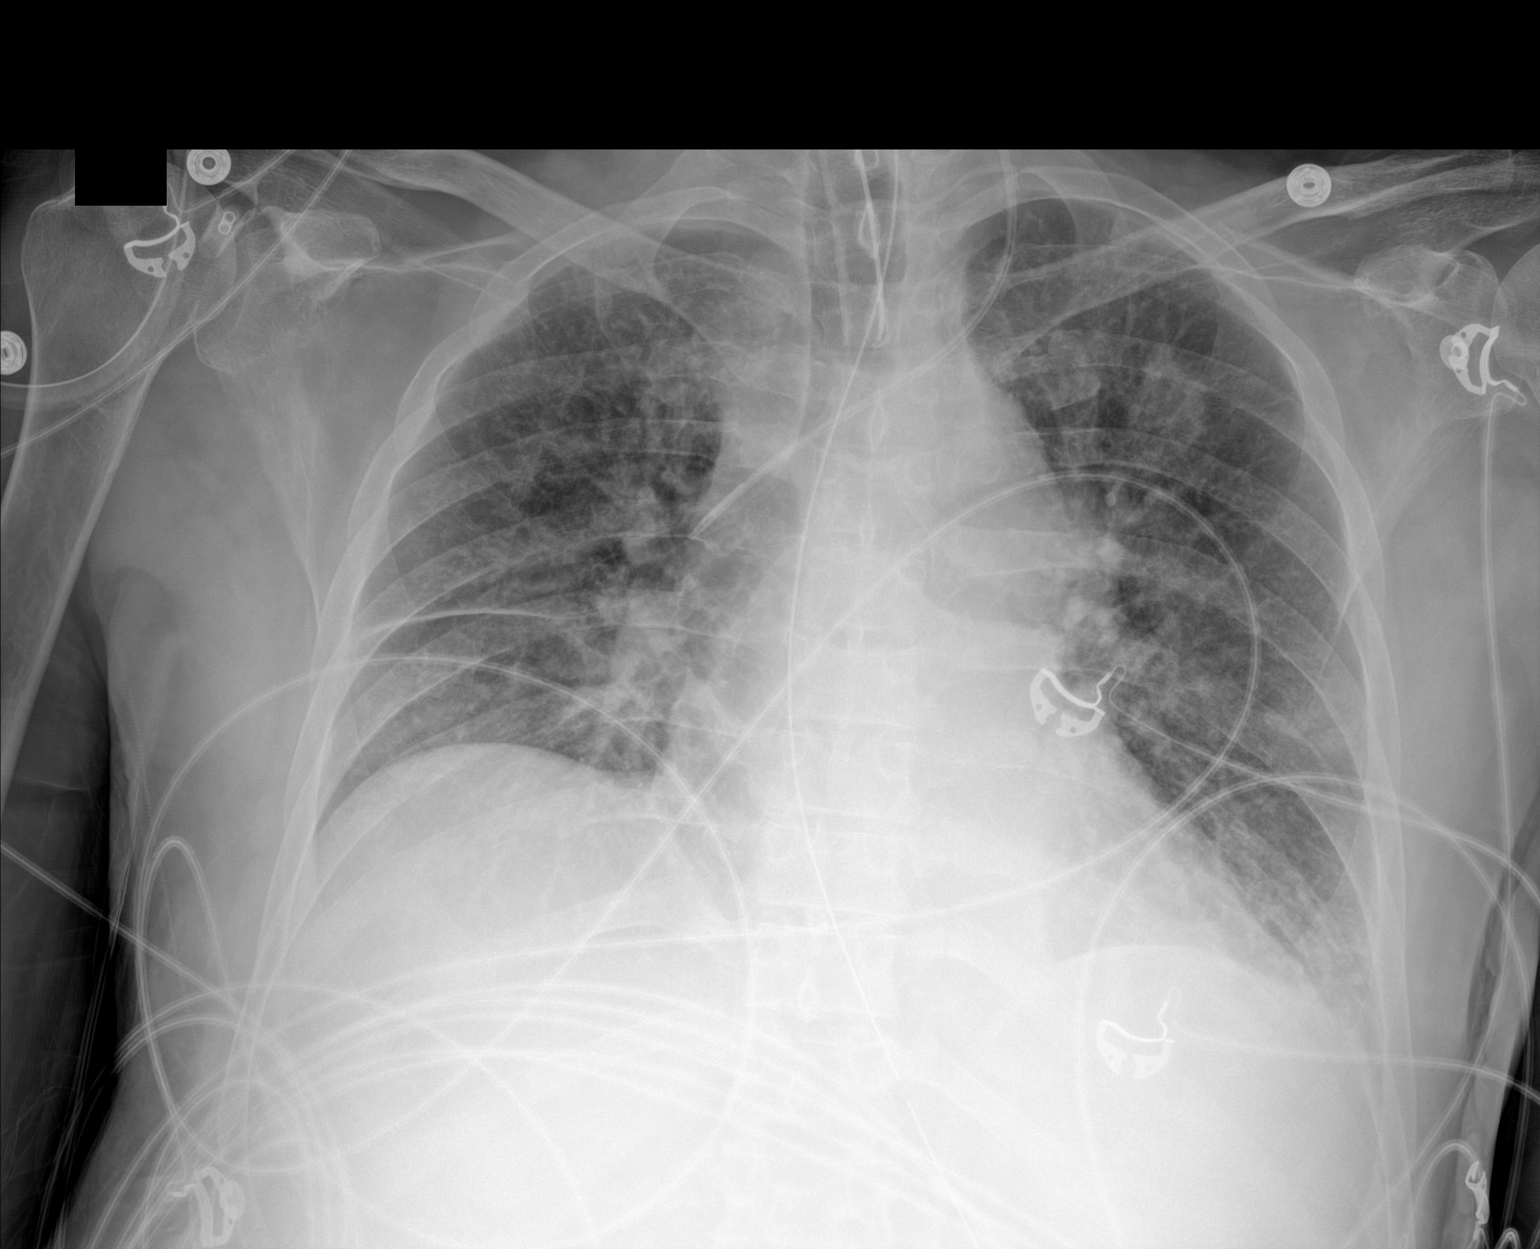

[1 of 1 positions shown; findings below may reference images not displayed]

FINDINGS: Left internal jugular central venous catheter tip projects over the
upper SVC. No pneumothorax. Endotracheal tube tip at the thoracic
inlet. The enteric tube is in place, tip below the diaphragm not
included in the field of view, side-port in the region of the
gastroesophageal junction. Low lung volumes. Unchanged heart size
and mediastinal contours. Slight worsening interstitial thickening
from prior exam. Increasing nodular airspace disease in the left
suprahilar lung and left midlung zone. No definite pleural fluid.
IMPRESSION: 1. Left internal jugular central venous catheter tip projects over
the upper SVC. No pneumothorax.
2. Slight increase interstitial thickening superimposed on chronic
bronchitic change, possible pulmonary edema. More nodular left lung
airspace disease is also increasing suspicious for pneumonia.

## 2021-12-07 IMAGING — CT CT ABD-PELV W/O CM
2 of 4 series · 13 of 36 positions shown, 16 images · non-contrast
Comparison: CT scans dated 05/11/2019 and 08/20/2018

CLINICAL DATA: Right-sided edema and warmth and redness. Urinary
tract infection. Recent placement of left ureteral stent.

EXAM:
CT CHEST, ABDOMEN AND PELVIS WITHOUT CONTRAST
TECHNIQUE: Multidetector CT imaging of the chest, abdomen and pelvis was
performed following the standard protocol without IV contrast.

[Series 508: axial st · axial · 0.89mm/px · z∈[-1202,-612]mm · 10 of 140 slices shown, 13 images]
[im 11/140  mediastinal]
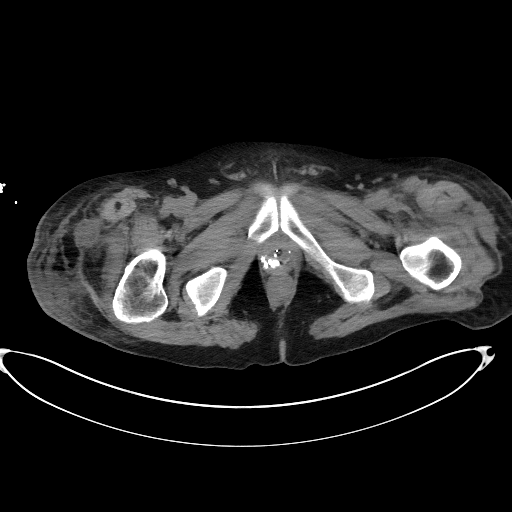
[im 11/140  lung]
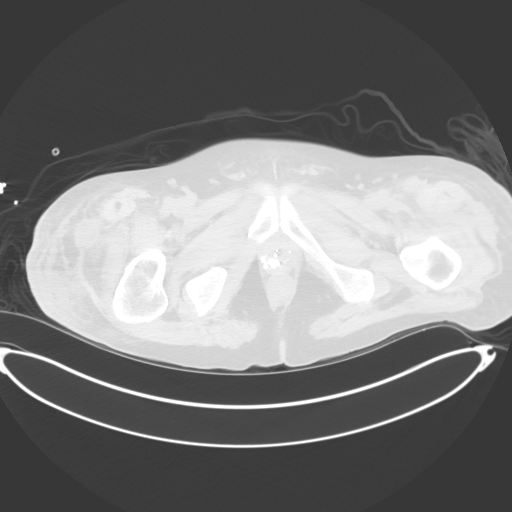
[im 22/140  lung]
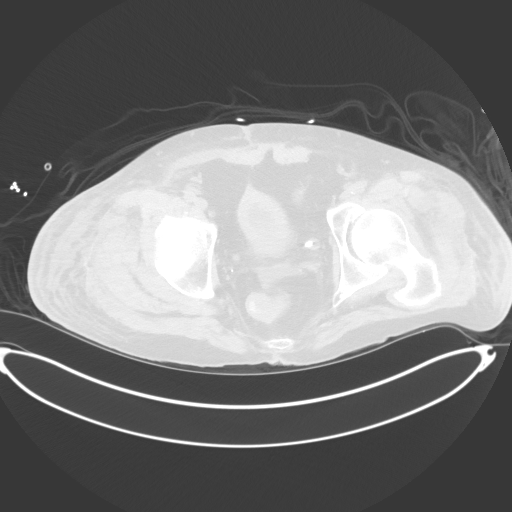
[im 43/140  lung]
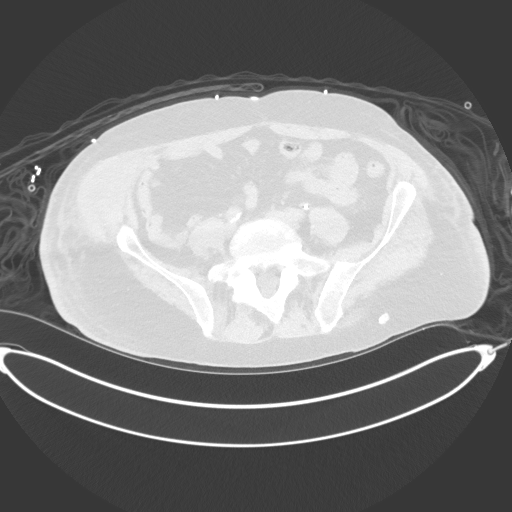
[im 54/140  lung]
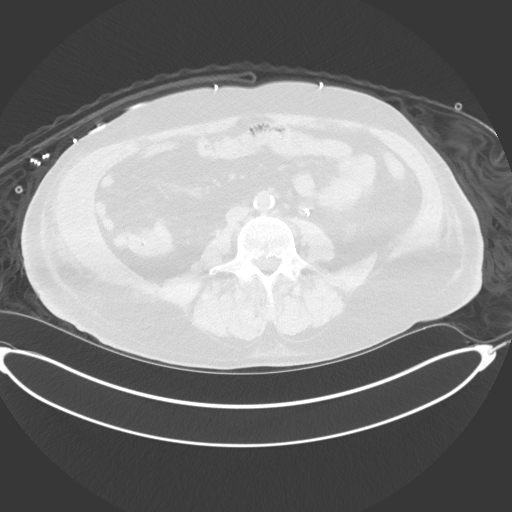
[im 65/140  mediastinal]
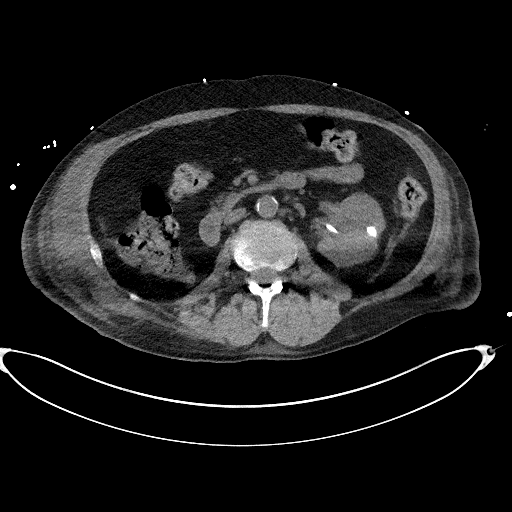
[im 65/140  lung]
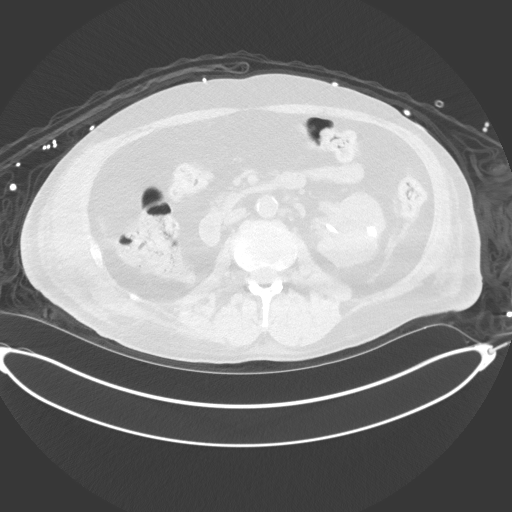
[im 75/140  lung]
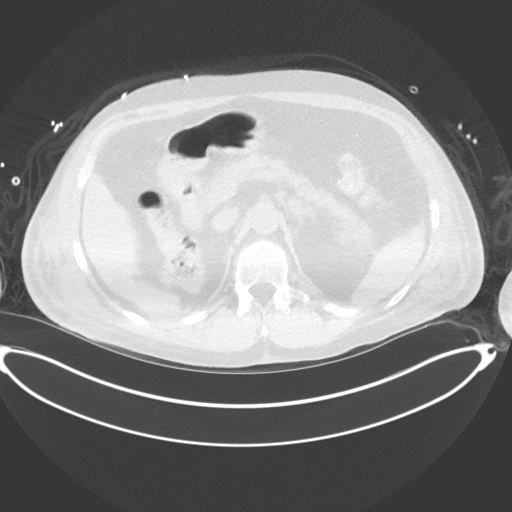
[im 86/140  lung]
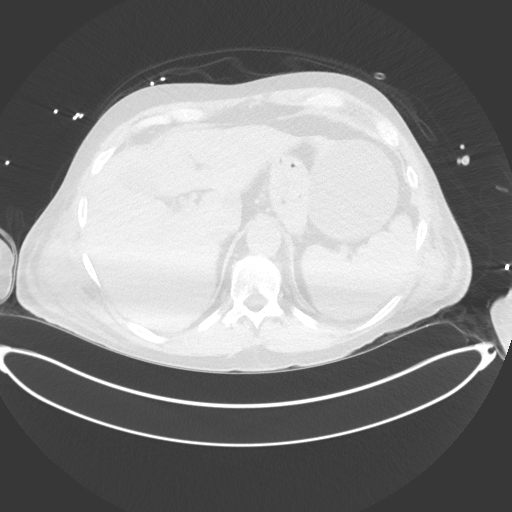
[im 107/140  lung]
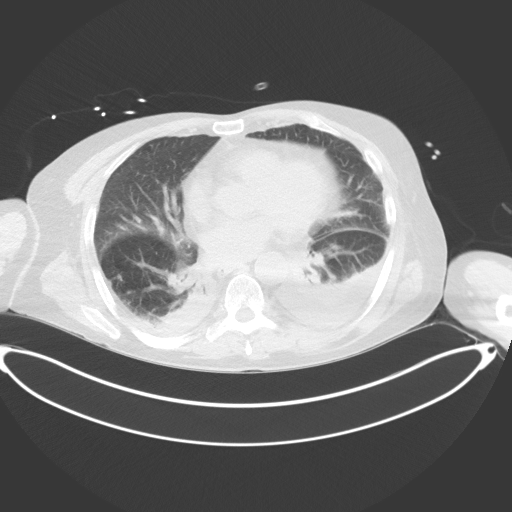
[im 118/140  mediastinal]
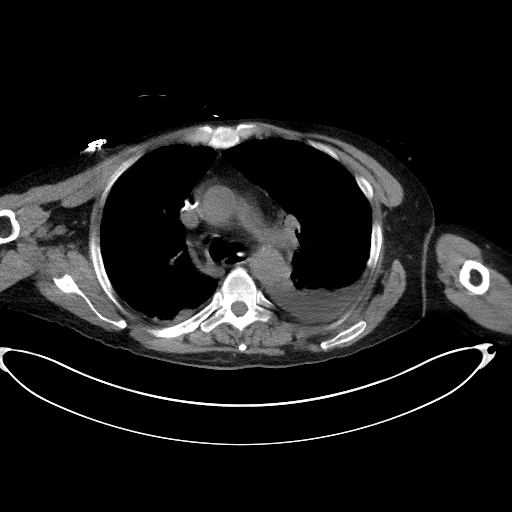
[im 118/140  lung]
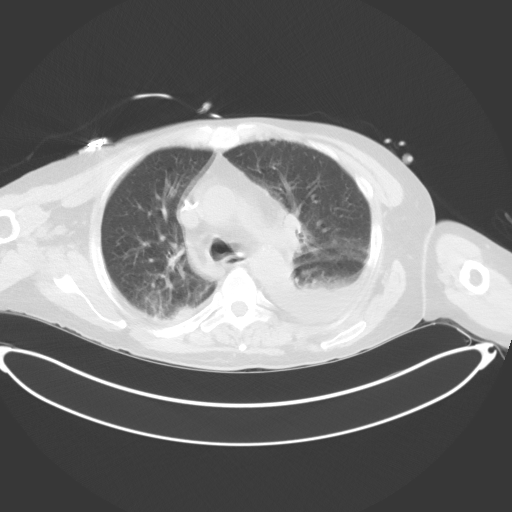
[im 129/140  lung]
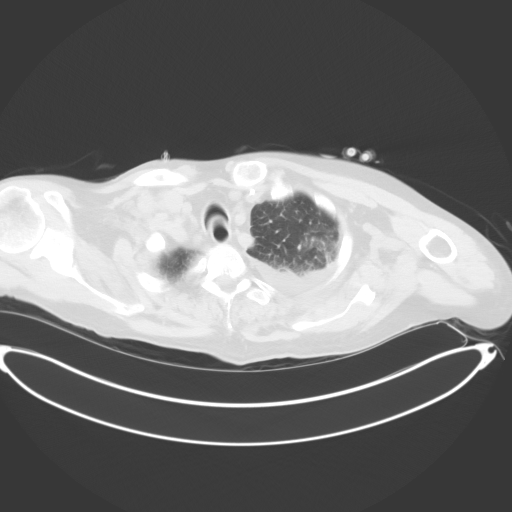

[Series 512: coronal · coronal · 0.92mm/px · 3 of 132 slices shown]
[im 27/132  lung]
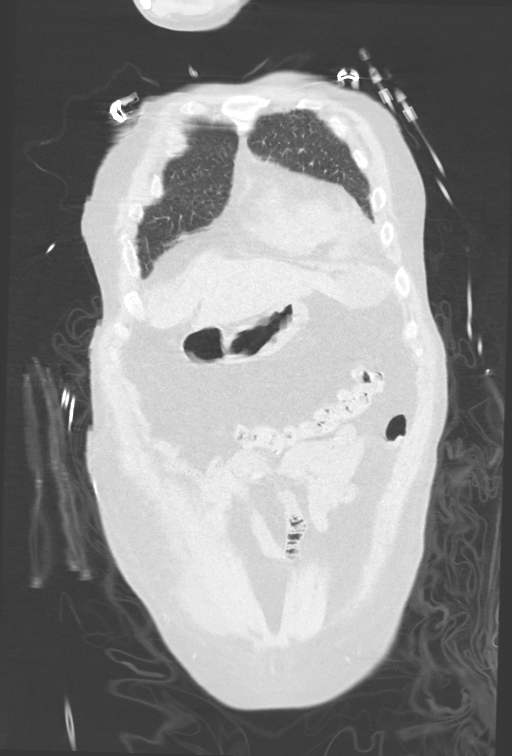
[im 53/132  lung]
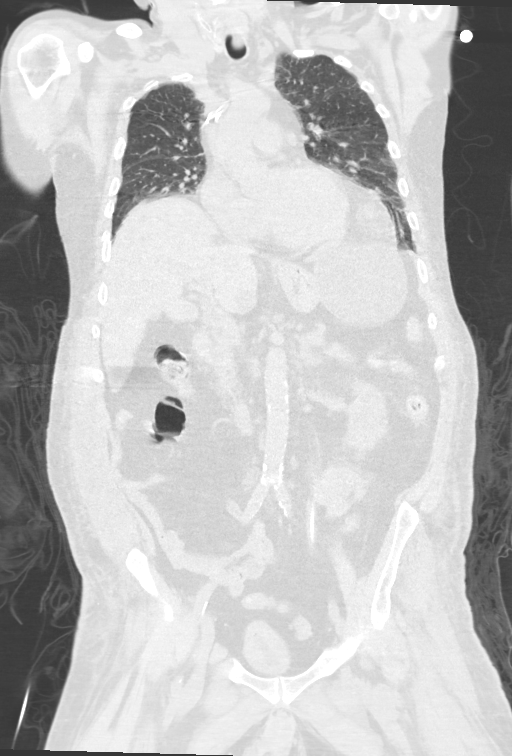
[im 79/132  lung]
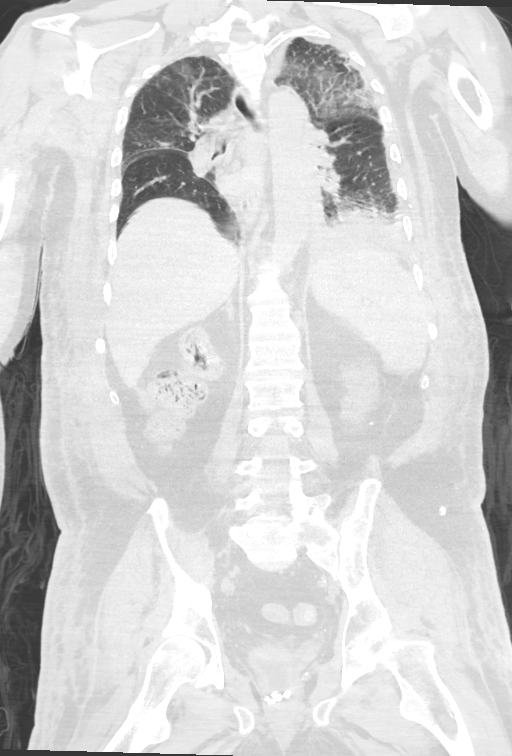

[13 of 36 positions shown; findings below may reference images not displayed]

FINDINGS: CT CHEST FINDINGS

Cardiovascular: Slight aortic atherosclerosis. Heart size is normal.
No pericardial effusion. However, there is extensive acute soft
tissue stranding in the left pericardial fat pad, likely infectious.

Mediastinum/Nodes: New extensive soft tissue stranding pericardial
fat pad worrisome for infection. No mediastinal adenopathy. Thyroid
gland is atrophic. Tiny hiatal hernia.

Lungs/Pleura: There are multiple cavitary lesions in the left lung,
likely representing septic emboli. There is an irregular 2.2 cm
nodule in the left upper lobe which could represent a mass but is
probably a septic embolus which is not yet cavitated. There is a 5
mm lesion in the lateral aspect of the left upper lobe which has not
yet cavitated.

Hazy infiltrate at the right lung apex with a small nodule posterior
medially at the apex. Consolidation at the inferior aspect of the
right hilum.

There are new small bilateral pleural effusions, left greater than
right with atelectasis at both lung bases.

Musculoskeletal: There is slight soft tissue stranding around the
inferior aspect of the right latissimus dorsi muscle soft tissue
stranding in the subcutaneous fat of both flanks, right greater than
left. No acute bone abnormality.

CT ABDOMEN PELVIS FINDINGS

Hepatobiliary: 9 cm cyst on the tip of the left lobe of the liver in
the left upper quadrant 2 cm cyst in the caudate lobe of the liver,
unchanged. There are 216 mm cysts in the inferior aspect of the
right lobe of the liver also unchanged. Biliary tree is normal.

Pancreas: Unremarkable. No pancreatic ductal dilatation or
surrounding inflammatory changes.

Spleen: Normal in size without focal abnormality.

Adrenals/Urinary Tract: Adrenal glands are normal. Left ureteral
stent in place. Stone in the mid left kidney, unchanged. No
hydronephrosis. Previous right nephrectomy. Foley catheter in the
bladder.

Stomach/Bowel: Stomach is within normal limits. Appendix has been
removed. No evidence of bowel wall thickening, distention, or
inflammatory changes.

Vascular/Lymphatic: Aortic atherosclerosis. No enlarged abdominal or
pelvic lymph nodes.

Reproductive: Numerous benign-appearing calcifications in the
prostate gland.

Other: No abdominal wall hernia or abnormality. No abdominopelvic
ascites.

Musculoskeletal: There is abnormal expansion of the muscles of the
lateral aspect of the right abdominal wall with lucency in that
area, worrisome for an intramuscular abscess. There is soft tissue
stranding in the adjacent subcutaneous fat. Soft tissue stranding
seen around the distal right [REDACTED] muscle and that
muscle is slightly hypertrophied as compared to the left and may
also be involved infection.

There is subcutaneous edema at the lateral aspect of both hips,
right more than left.

No significant bone abnormality.
IMPRESSION: 1. Multiple cavitary lesions in the left lung consistent with septic
emboli. Abnormal soft tissue stranding in the prominent left
pericardial fat pad also worrisome for infection.
2. Abnormal expansion of the muscles of the lateral aspect of the
right abdominal wall with lucency in that area, worrisome for an
intramuscular abscess/infectious myositis.
3. Soft tissue stranding around the inferior aspect of the right
latissimus dorsi muscle and in the subcutaneous fat of both flanks,
right greater than left, consistent with cellulitis.
4. New small bilateral pleural effusions, left greater than right.
5. Aortic Atherosclerosis ([id number]-55Z.Z).
6. New left ureteral stent appears in good position. No acute
abnormalities of the left kidney.

## 2021-12-07 IMAGING — CT CT CHEST W/O CM
2 of 4 series · 13 of 36 positions shown, 16 images · non-contrast
Comparison: CT scans dated 05/11/2019 and 08/20/2018

CLINICAL DATA: Right-sided edema and warmth and redness. Urinary
tract infection. Recent placement of left ureteral stent.

EXAM:
CT CHEST, ABDOMEN AND PELVIS WITHOUT CONTRAST
TECHNIQUE: Multidetector CT imaging of the chest, abdomen and pelvis was
performed following the standard protocol without IV contrast.

[Series 2: axial st · axial · 0.89mm/px · z∈[-1202,-612]mm · 10 of 140 slices shown, 13 images]
[im 11/140  mediastinal]
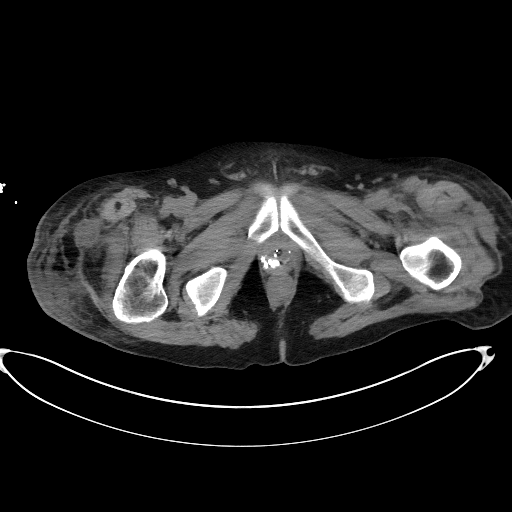
[im 11/140  lung]
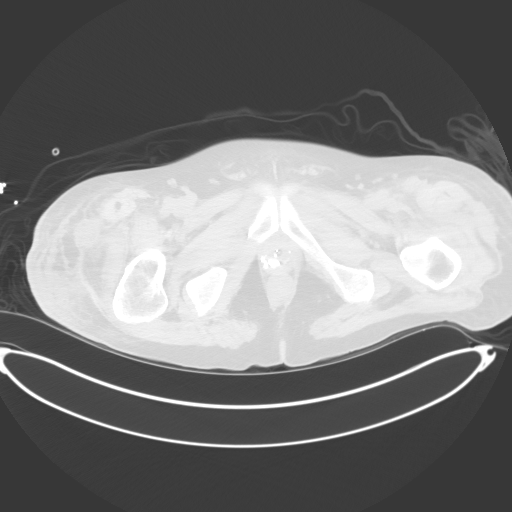
[im 22/140  lung]
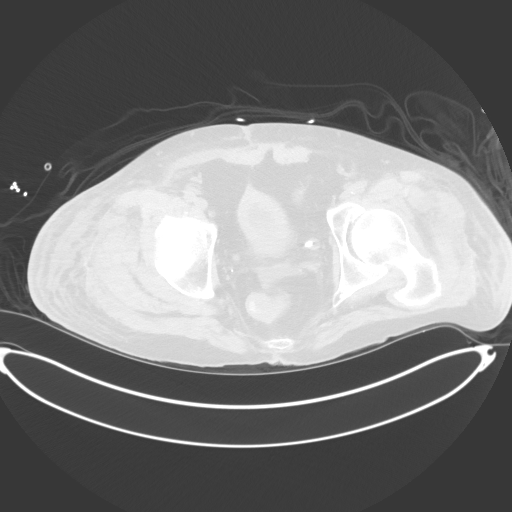
[im 43/140  lung]
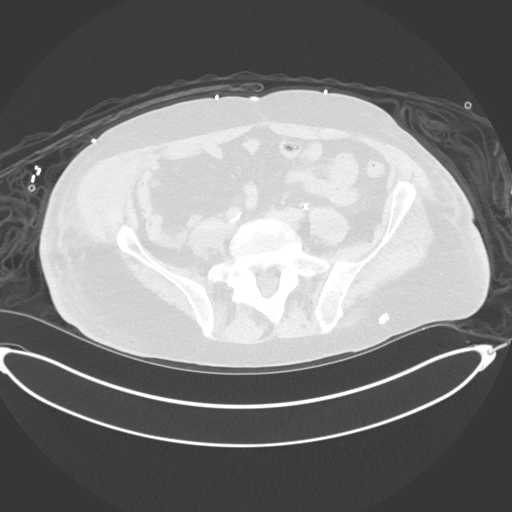
[im 54/140  lung]
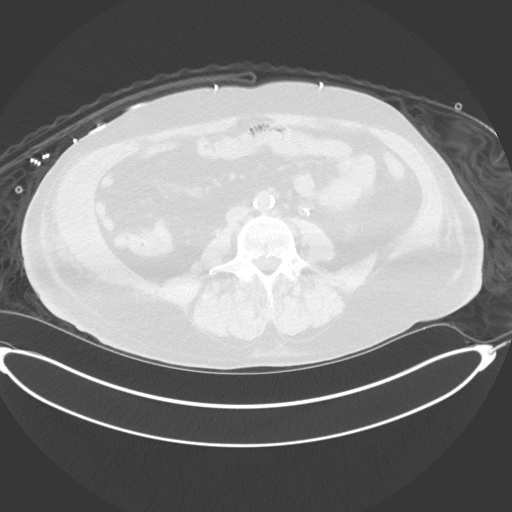
[im 65/140  mediastinal]
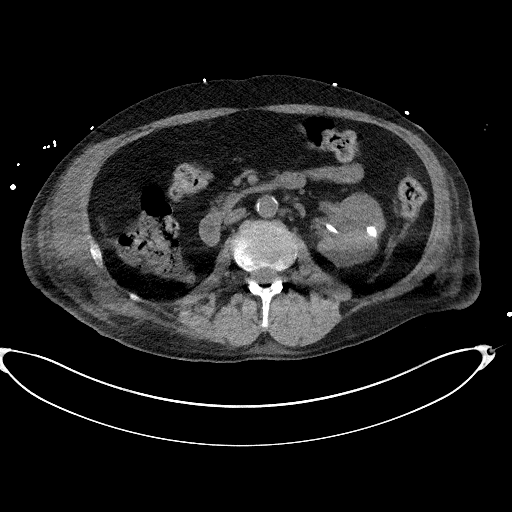
[im 65/140  lung]
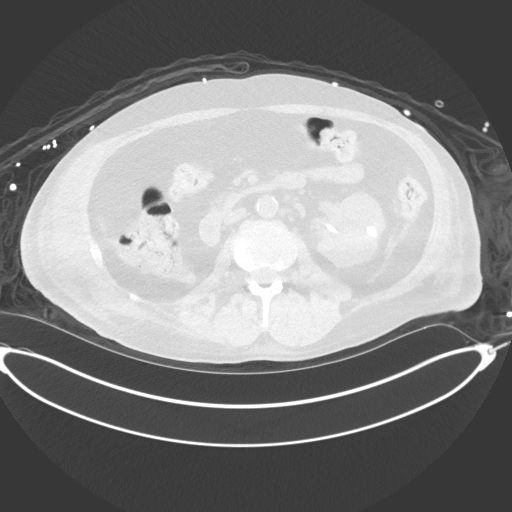
[im 75/140  lung]
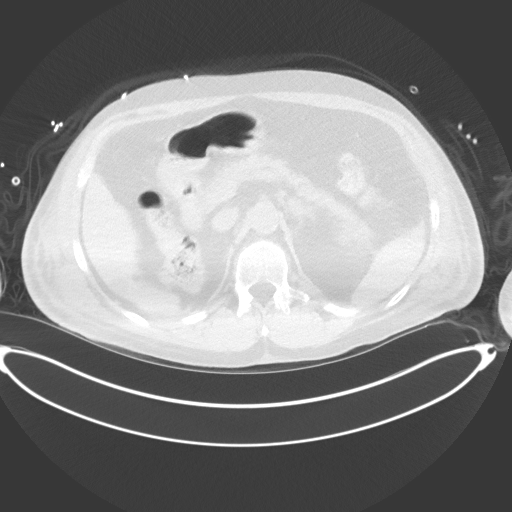
[im 86/140  lung]
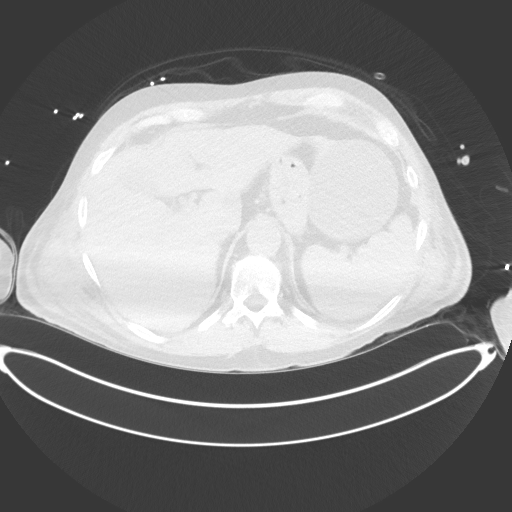
[im 107/140  lung]
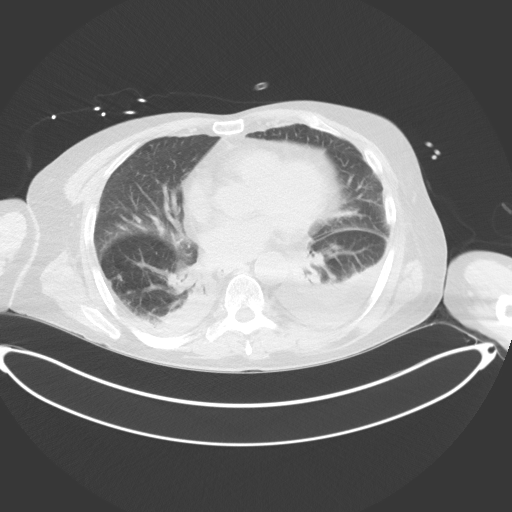
[im 118/140  mediastinal]
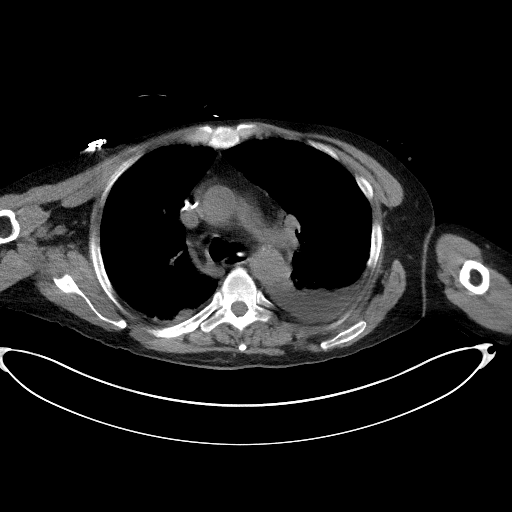
[im 118/140  lung]
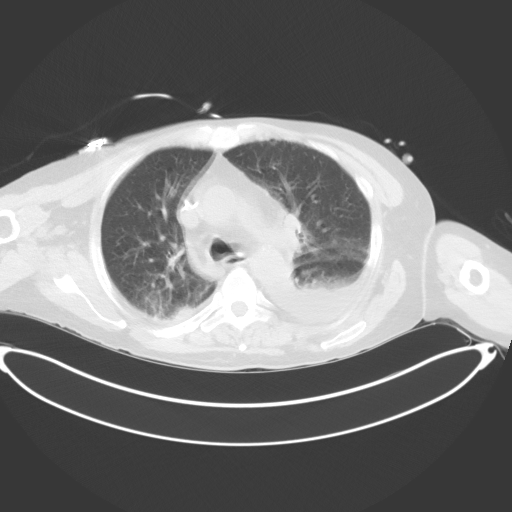
[im 129/140  lung]
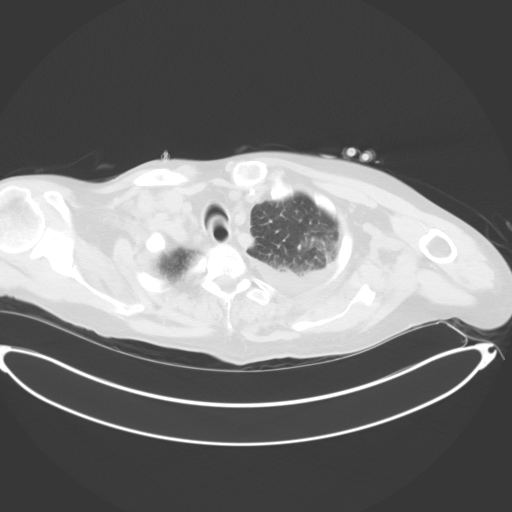

[Series 5: coronal · coronal · 0.92mm/px · 3 of 132 slices shown]
[im 27/132  lung]
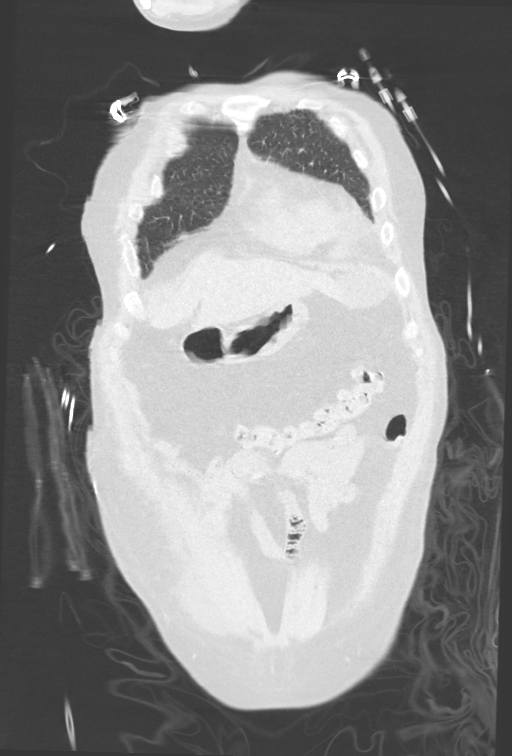
[im 53/132  lung]
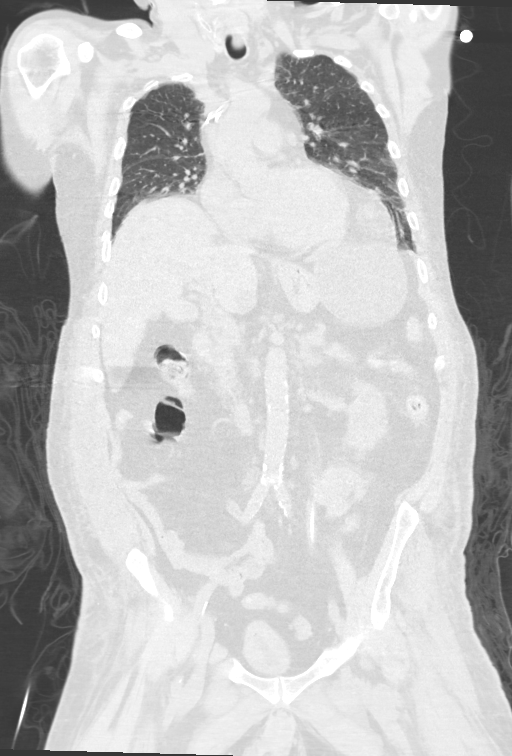
[im 79/132  lung]
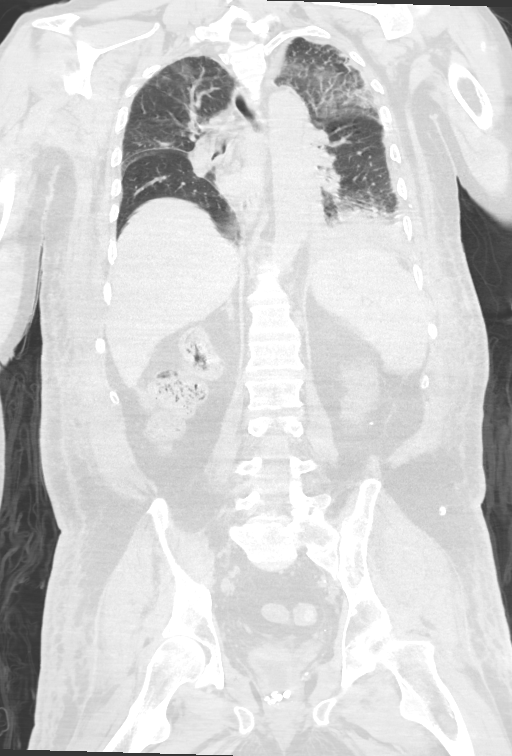

[13 of 36 positions shown; findings below may reference images not displayed]

FINDINGS: CT CHEST FINDINGS

Cardiovascular: Slight aortic atherosclerosis. Heart size is normal.
No pericardial effusion. However, there is extensive acute soft
tissue stranding in the left pericardial fat pad, likely infectious.

Mediastinum/Nodes: New extensive soft tissue stranding pericardial
fat pad worrisome for infection. No mediastinal adenopathy. Thyroid
gland is atrophic. Tiny hiatal hernia.

Lungs/Pleura: There are multiple cavitary lesions in the left lung,
likely representing septic emboli. There is an irregular 2.2 cm
nodule in the left upper lobe which could represent a mass but is
probably a septic embolus which is not yet cavitated. There is a 5
mm lesion in the lateral aspect of the left upper lobe which has not
yet cavitated.

Hazy infiltrate at the right lung apex with a small nodule posterior
medially at the apex. Consolidation at the inferior aspect of the
right hilum.

There are new small bilateral pleural effusions, left greater than
right with atelectasis at both lung bases.

Musculoskeletal: There is slight soft tissue stranding around the
inferior aspect of the right latissimus dorsi muscle soft tissue
stranding in the subcutaneous fat of both flanks, right greater than
left. No acute bone abnormality.

CT ABDOMEN PELVIS FINDINGS

Hepatobiliary: 9 cm cyst on the tip of the left lobe of the liver in
the left upper quadrant 2 cm cyst in the caudate lobe of the liver,
unchanged. There are 216 mm cysts in the inferior aspect of the
right lobe of the liver also unchanged. Biliary tree is normal.

Pancreas: Unremarkable. No pancreatic ductal dilatation or
surrounding inflammatory changes.

Spleen: Normal in size without focal abnormality.

Adrenals/Urinary Tract: Adrenal glands are normal. Left ureteral
stent in place. Stone in the mid left kidney, unchanged. No
hydronephrosis. Previous right nephrectomy. Foley catheter in the
bladder.

Stomach/Bowel: Stomach is within normal limits. Appendix has been
removed. No evidence of bowel wall thickening, distention, or
inflammatory changes.

Vascular/Lymphatic: Aortic atherosclerosis. No enlarged abdominal or
pelvic lymph nodes.

Reproductive: Numerous benign-appearing calcifications in the
prostate gland.

Other: No abdominal wall hernia or abnormality. No abdominopelvic
ascites.

Musculoskeletal: There is abnormal expansion of the muscles of the
lateral aspect of the right abdominal wall with lucency in that
area, worrisome for an intramuscular abscess. There is soft tissue
stranding in the adjacent subcutaneous fat. Soft tissue stranding
seen around the distal right [REDACTED] muscle and that
muscle is slightly hypertrophied as compared to the left and may
also be involved infection.

There is subcutaneous edema at the lateral aspect of both hips,
right more than left.

No significant bone abnormality.
IMPRESSION: 1. Multiple cavitary lesions in the left lung consistent with septic
emboli. Abnormal soft tissue stranding in the prominent left
pericardial fat pad also worrisome for infection.
2. Abnormal expansion of the muscles of the lateral aspect of the
right abdominal wall with lucency in that area, worrisome for an
intramuscular abscess/infectious myositis.
3. Soft tissue stranding around the inferior aspect of the right
latissimus dorsi muscle and in the subcutaneous fat of both flanks,
right greater than left, consistent with cellulitis.
4. New small bilateral pleural effusions, left greater than right.
5. Aortic Atherosclerosis ([id number].Z).
6. New left ureteral stent appears in good position. No acute
abnormalities of the left kidney.

## 2021-12-07 IMAGING — DX DG CHEST 1V PORT
1 series · 1 of 1 positions shown · non-contrast
Comparison: 05/12/2019

CLINICAL DATA: Pulmonary disease, cough

EXAM:
PORTABLE CHEST 1 VIEW

[chest ap]
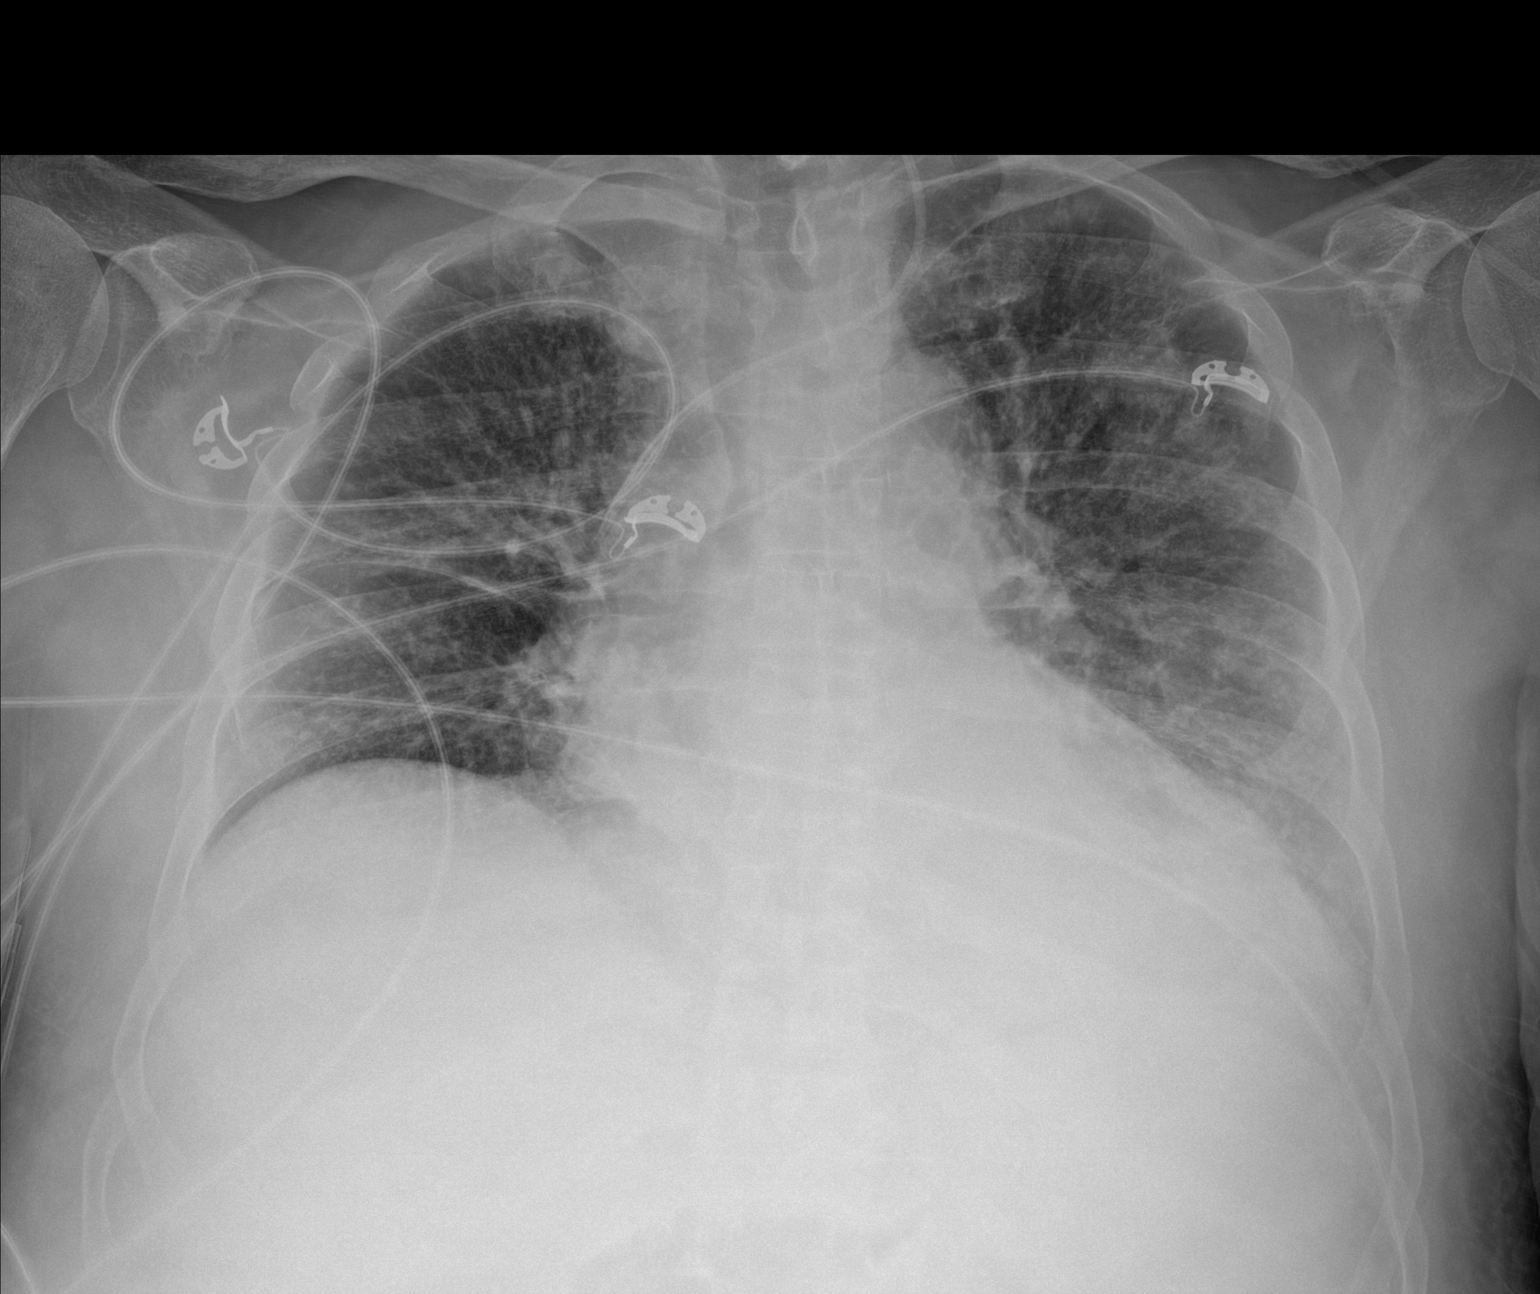

[1 of 1 positions shown; findings below may reference images not displayed]

FINDINGS: Interval endotracheal and esophagogastric extubation. Left neck
vascular catheter, tip projecting over the lower SVC. Diffuse
interstitial pulmonary opacity, similar to prior examination.
Cardiomegaly.
IMPRESSION: 1.  Interval endotracheal and esophagogastric extubation.

2. Diffuse interstitial pulmonary opacity, similar to prior
examination, and consistent with edema or infection. No new airspace
opacity.

3.  Cardiomegaly.

## 2021-12-10 IMAGING — CT CT ABD-PELV W/O CM
2 of 4 series · 16 of 46 positions shown, 18 images · non-contrast
Comparison: 05/15/2019

CLINICAL DATA: Abdominal pain, fever. Solitary left kidney with
obstructing ureteral calculus status post ureteral stent placement.

EXAM:
CT ABDOMEN AND PELVIS WITHOUT CONTRAST
TECHNIQUE: Multidetector CT imaging of the abdomen and pelvis was performed
following the standard protocol without IV contrast.

[Series 2: routine abd/pel wo · axial · 0.95mm/px · z∈[-549,-59]mm · 13 of 108 slices shown, 15 images]
[im 5/108  soft-tissue]
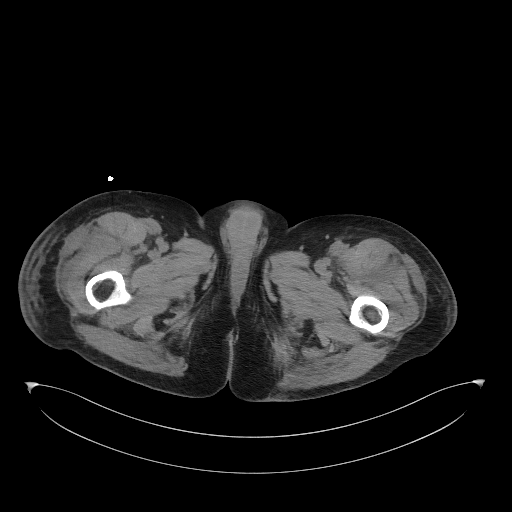
[im 5/108  bone]
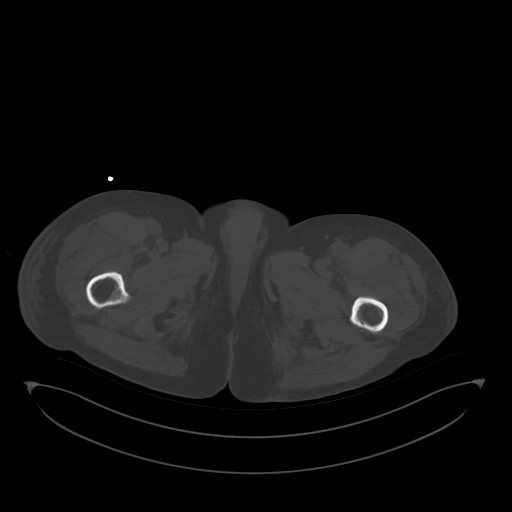
[im 13/108  soft-tissue]
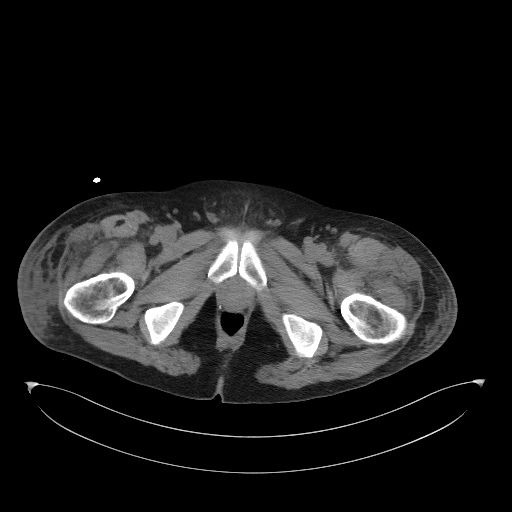
[im 22/108  soft-tissue]
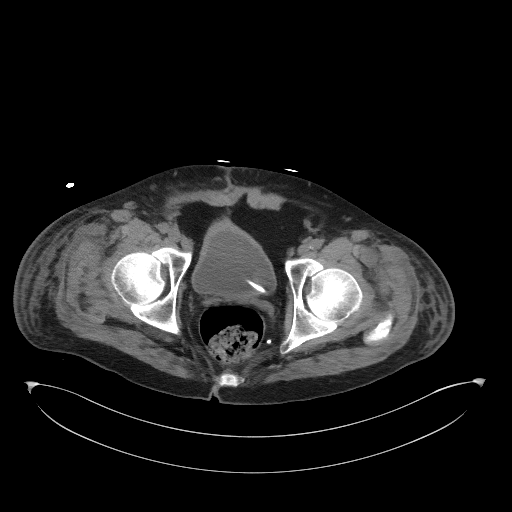
[im 30/108  soft-tissue]
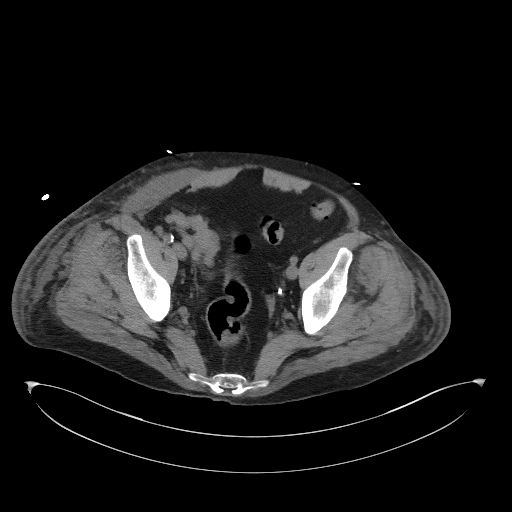
[im 39/108  soft-tissue]
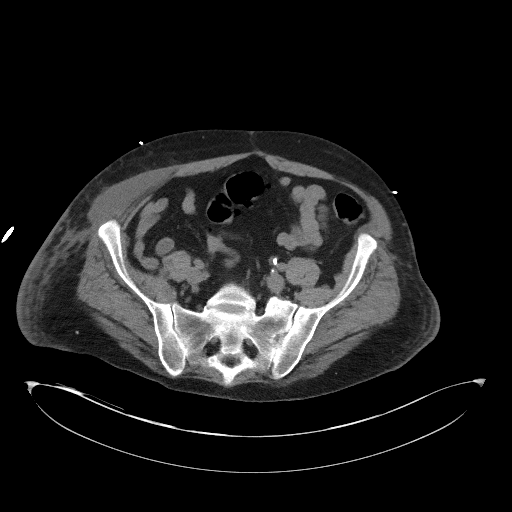
[im 48/108  soft-tissue]
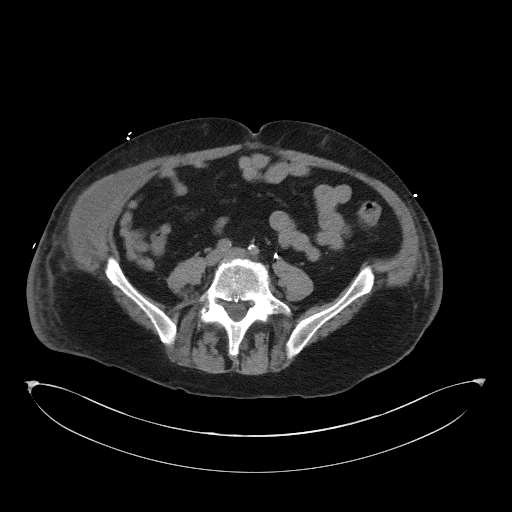
[im 56/108  soft-tissue]
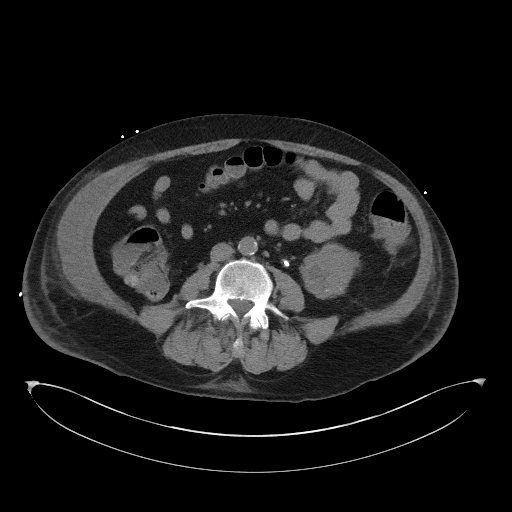
[im 60/108  soft-tissue]
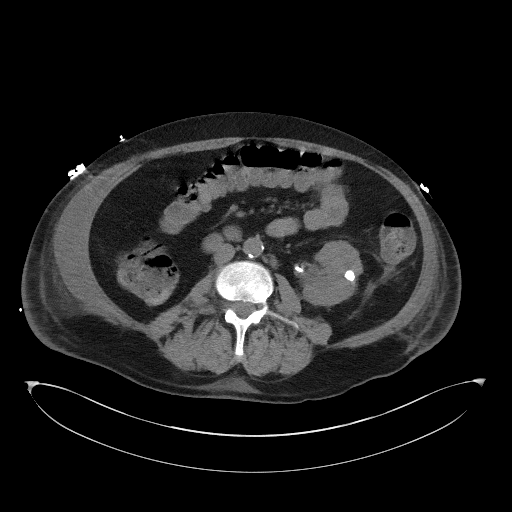
[im 69/108  soft-tissue]
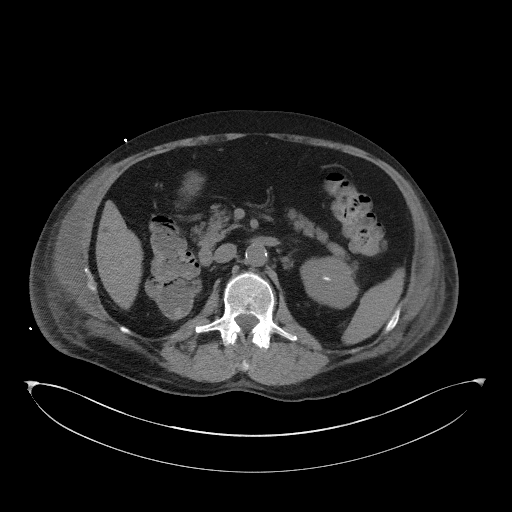
[im 69/108  bone]
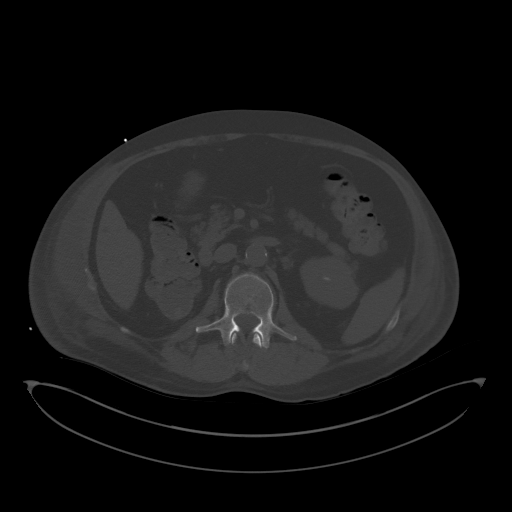
[im 78/108  soft-tissue]
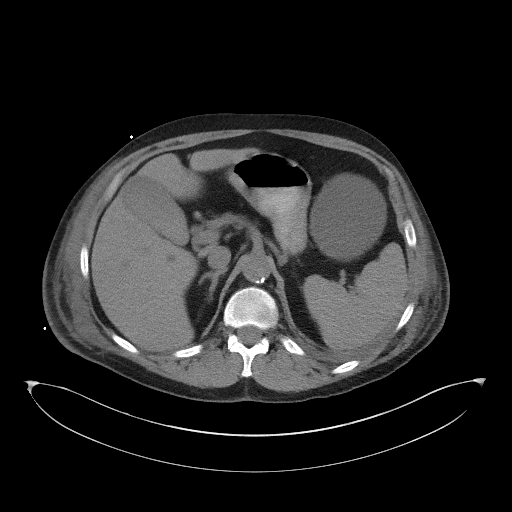
[im 86/108  soft-tissue]
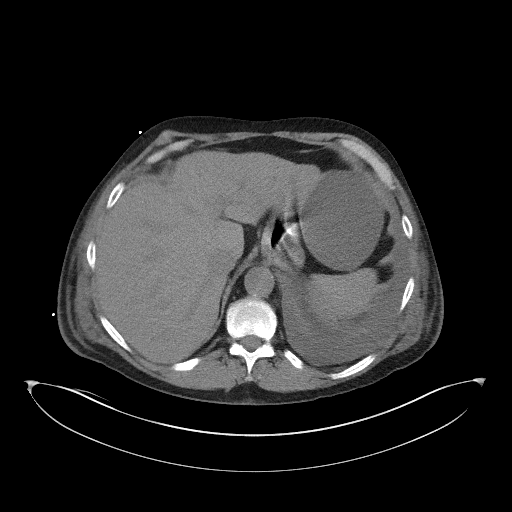
[im 95/108  soft-tissue]
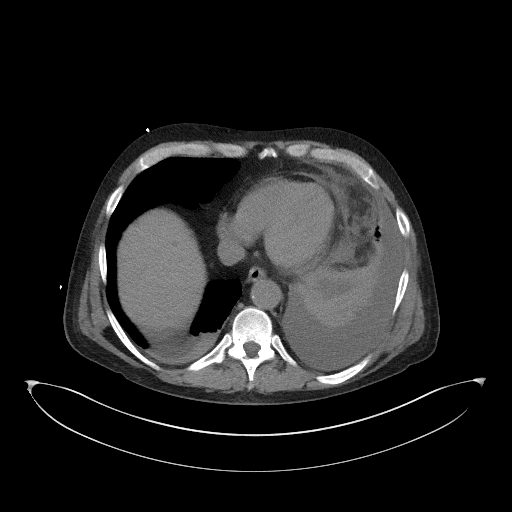
[im 103/108  soft-tissue]
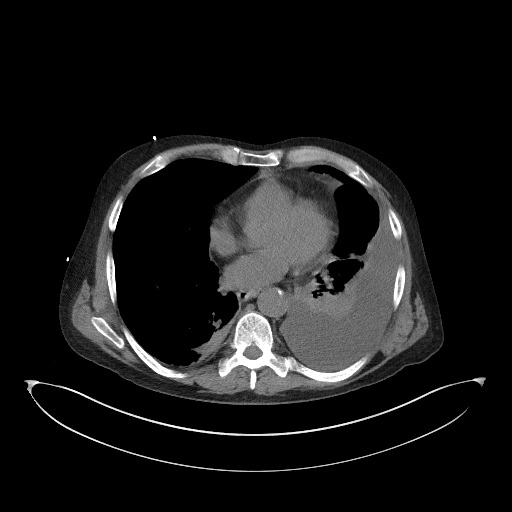

[Series 5: coronal st · coronal · 0.87mm/px · 3 of 93 slices shown]
[im 31/93  soft-tissue]
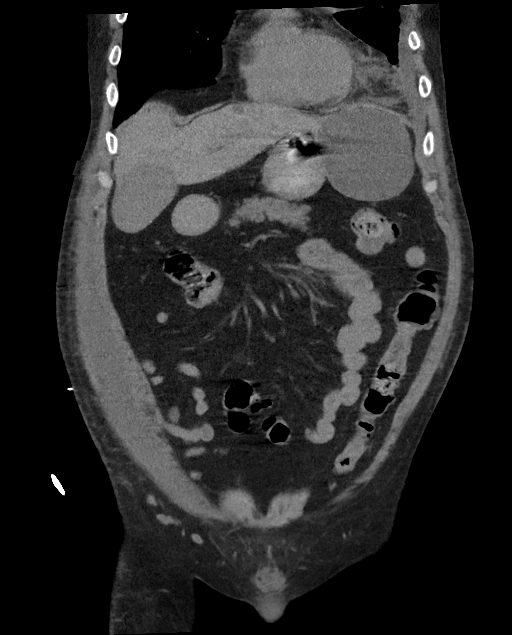
[im 41/93  soft-tissue]
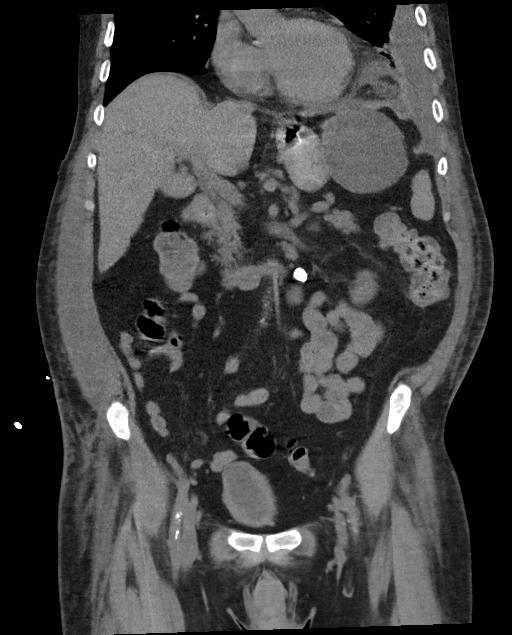
[im 52/93  soft-tissue]
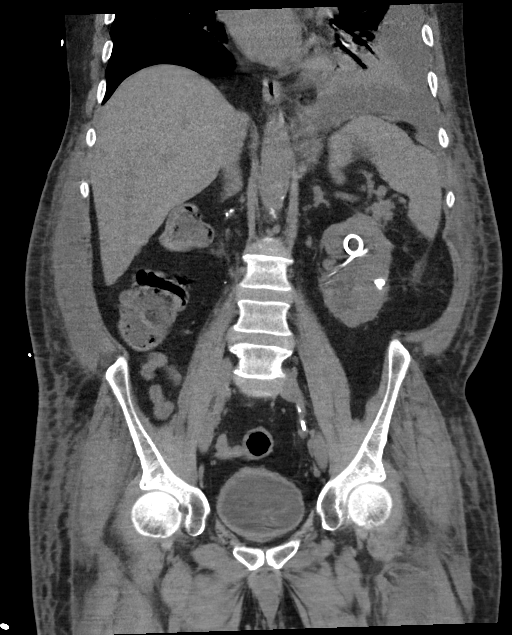

[16 of 46 positions shown; findings below may reference images not displayed]

FINDINGS: Lower chest: Heart is normal size. Moderate left pleural effusion,
increasing since prior study. Bilateral lower lobe atelectasis or
consolidation. Stranding is again noted within the epicardial fat
pad along the left heart border, stable since recent study.

Hepatobiliary: Multiple low-density lesions again seen within the
liver, unchanged, likely cysts. Large exophytic cyst off the left
hepatic lobe is stable. Gallbladder unremarkable.

Pancreas: No focal abnormality or ductal dilatation.

Spleen: No focal abnormality.  Normal size.

Adrenals/Urinary Tract: Solitary left kidney. Left ureteral stent is
in place, unchanged. No hydronephrosis. Calcification in the midpole
of the left kidney is unchanged. Urinary bladder is unremarkable.
Adrenal glands unremarkable.

Stomach/Bowel: Stomach, large and small bowel grossly unremarkable.

Vascular/Lymphatic: Aortic atherosclerosis. No enlarged abdominal or
pelvic lymph nodes.

Reproductive: Extensive prostate calcifications.

Other: No free fluid or free air.

Musculoskeletal: No acute bony abnormality. Again noted is
enlargement of the right oblique abdominal wall musculature with
possible low-density fluid collection within the muscle. This is
similar to prior study.
IMPRESSION: Moderate left pleural effusion, increased since prior study.
Bibasilar atelectasis or consolidation/pneumonia.

Enlargement of the right oblique abdominal wall musculature with
low-density/fluid density within the muscle. This could reflect
myositis, muscle injury, or abscess. This is similar to prior study.

Left ureteral stent in place. No hydronephrosis or change since
prior study. Solitary left kidney.

Aortic atherosclerosis.

## 2021-12-11 IMAGING — DX DG CHEST 1V PORT
1 series · 1 of 1 positions shown · non-contrast
Comparison: CT abdomen pelvis-05/18/2019; chest CT-05/15/2019;
chest radiograph-05/15/2019

CLINICAL DATA: Post left-sided thoracentesis

EXAM:
PORTABLE CHEST 1 VIEW

[chest ap]
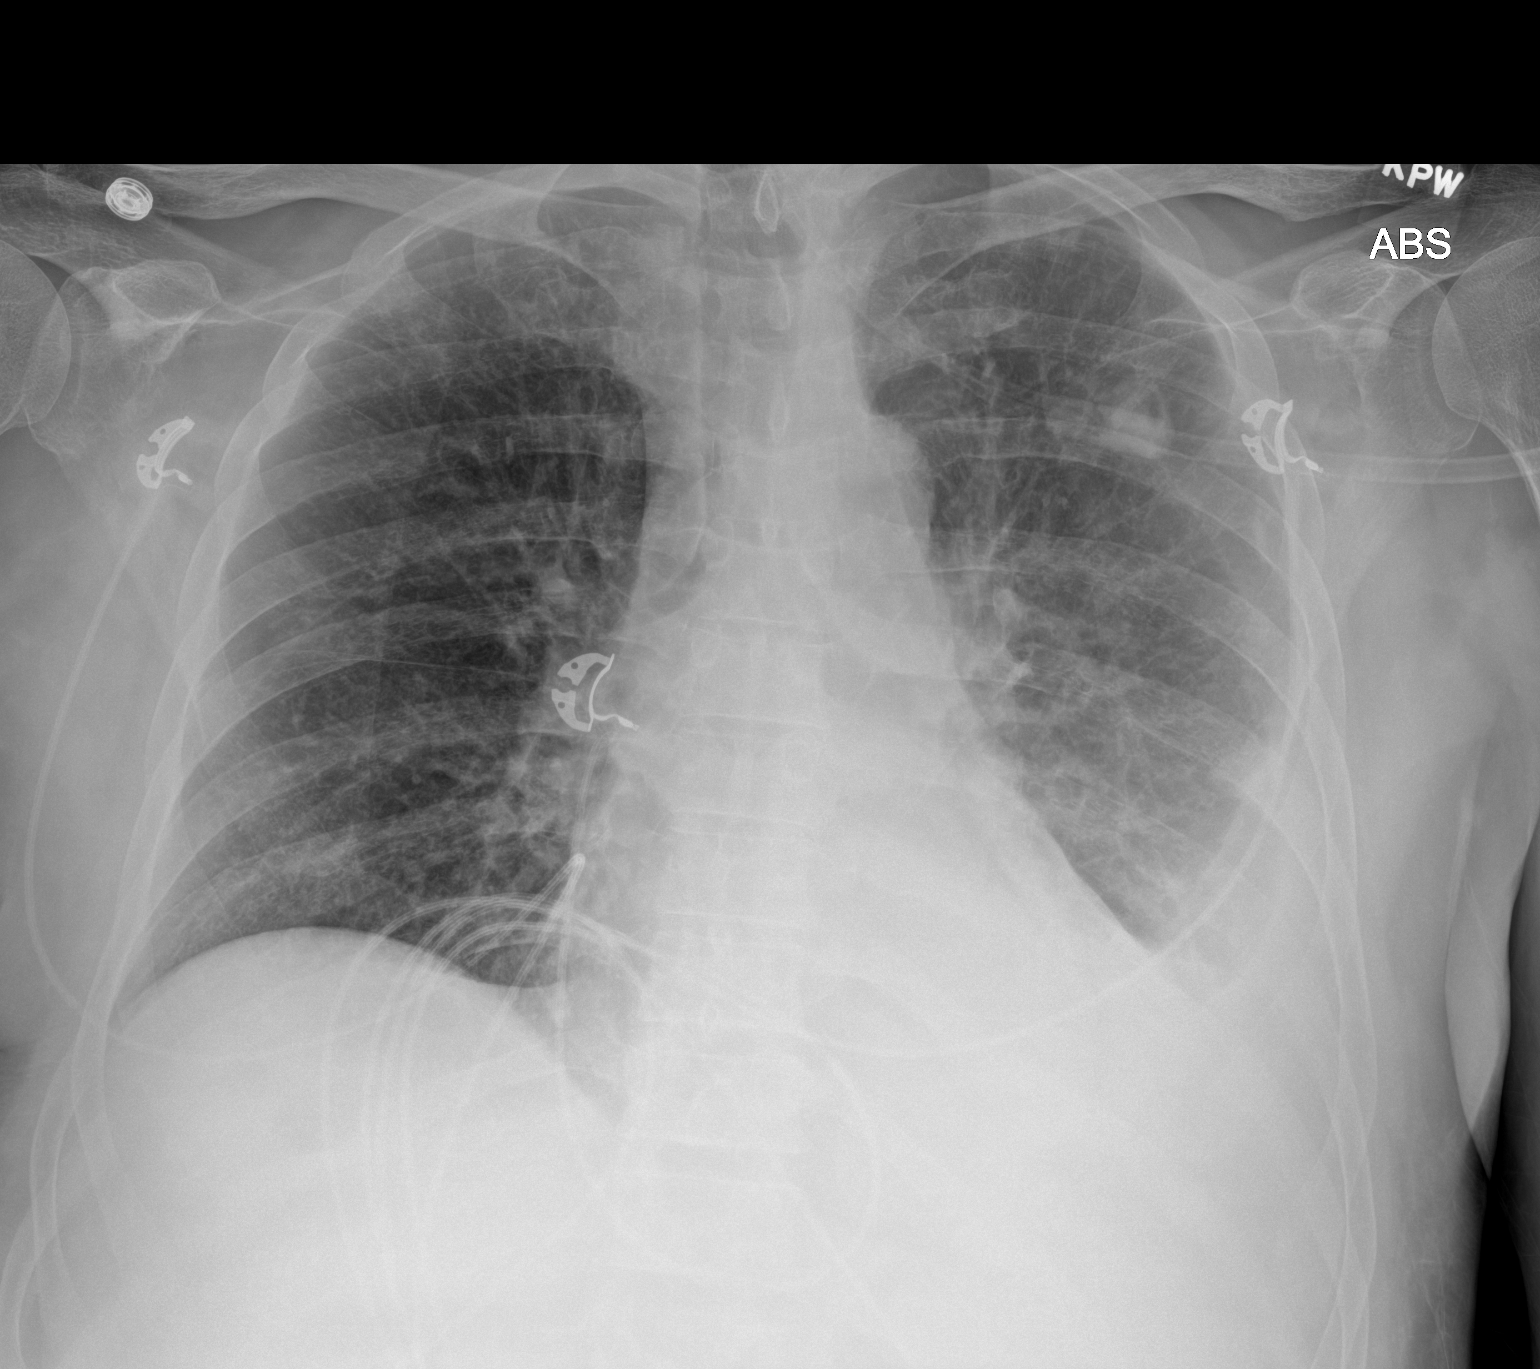

[1 of 1 positions shown; findings below may reference images not displayed]

FINDINGS: Grossly unchanged cardiac silhouette and mediastinal contours.
Interval reduction in persistent small likely partially loculated
left-sided effusion post thoracentesis. No pneumothorax.

Improved aeration of the left lung base with persistent left basilar
heterogeneous/consolidative opacities. Cavitary nodules within the
left upper lung are grossly unchanged, better demonstrated on chest
CT performed 05/15/2019. The right lung remains well aerated. No
definite right-sided pleural effusion. No definite evidence of
edema. No acute osseous abnormalities.
IMPRESSION: 1. Interval reduction in persistent small likely partially loculated
left-sided effusion post thoracentesis. No pneumothorax.
2. Improved aeration of the left lung base with persistent left
basilar consolidative opacities, atelectasis versus infiltrate.
3. Similar appearance of cavitary nodules with the left upper lung
better demonstrated on recent chest CT again worrisome for septic
emboli.

## 2021-12-11 IMAGING — US US THORACENTESIS ASP PLEURAL SPACE W/IMG GUIDE
1 series · 5 of 5 positions shown · non-contrast
Comparison: Chest CT-05/15/2019

MEDICATIONS:
None.

COMPLICATIONS:
None immediate.

INDICATION: Symptomatic left sided pleural effusion. Concern for septic
pulmonary emboli. Please perform ultrasound-guided thoracentesis for
diagnostic and therapeutic purposes.

EXAM:
US THORACENTESIS ASP PLEURAL SPACE W/IMG GUIDE
TECHNIQUE: Informed written consent was obtained from the patient after a
discussion of the risks, benefits and alternatives to treatment. A
timeout was performed prior to the initiation of the procedure.

[Series 1: us thoracentesis asp pleural space w/img guide · 5 of 5 slices shown]
[im 1/5]
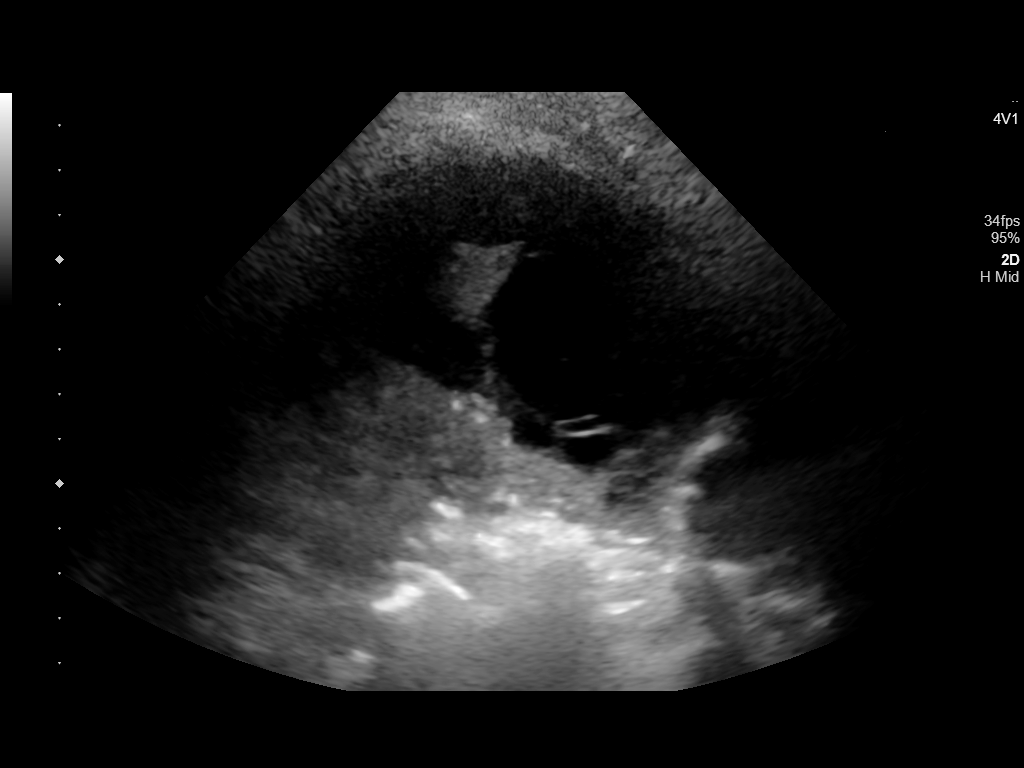
[im 2/5]
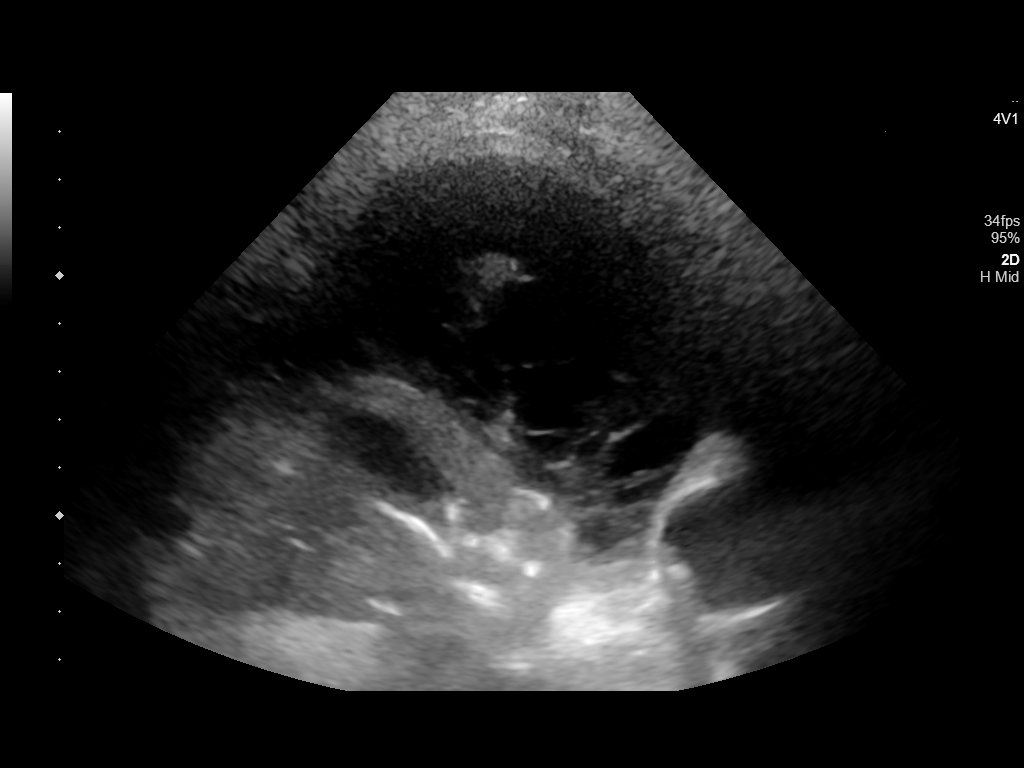
[im 3/5]
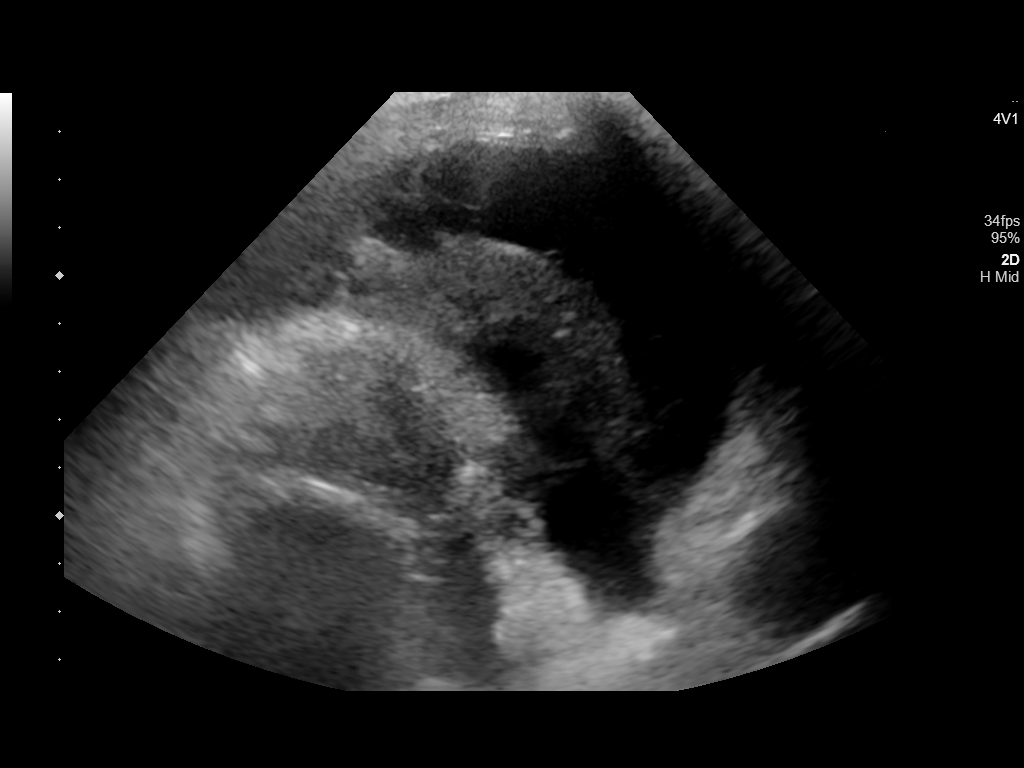
[im 4/5]
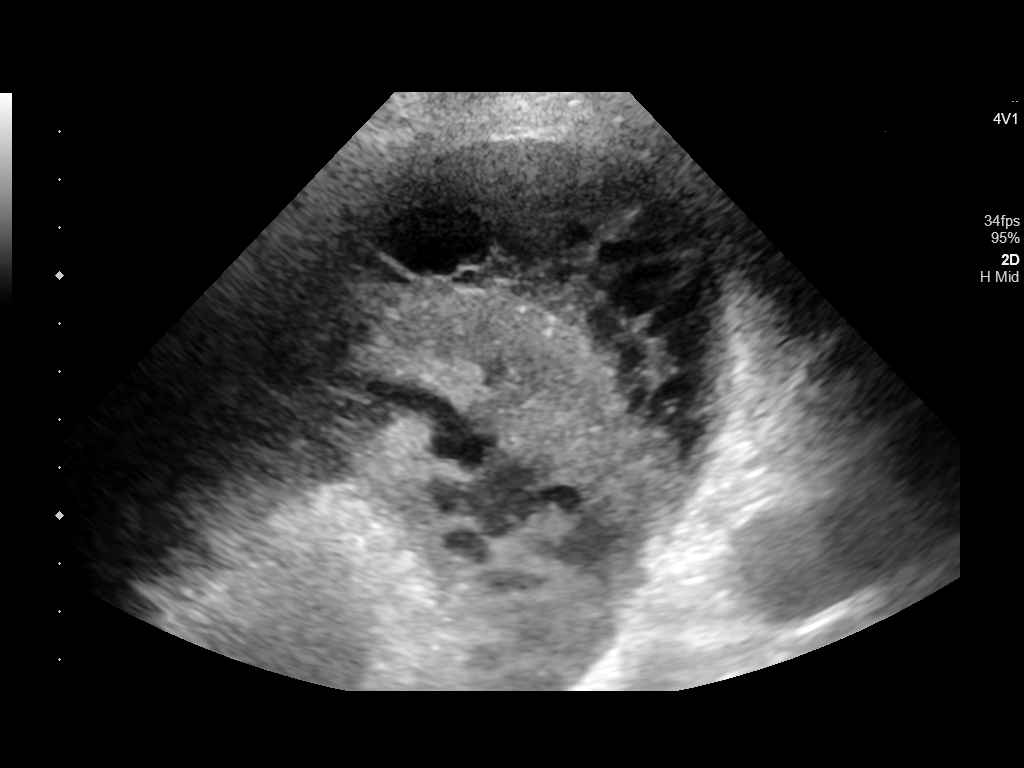
[im 5/5]
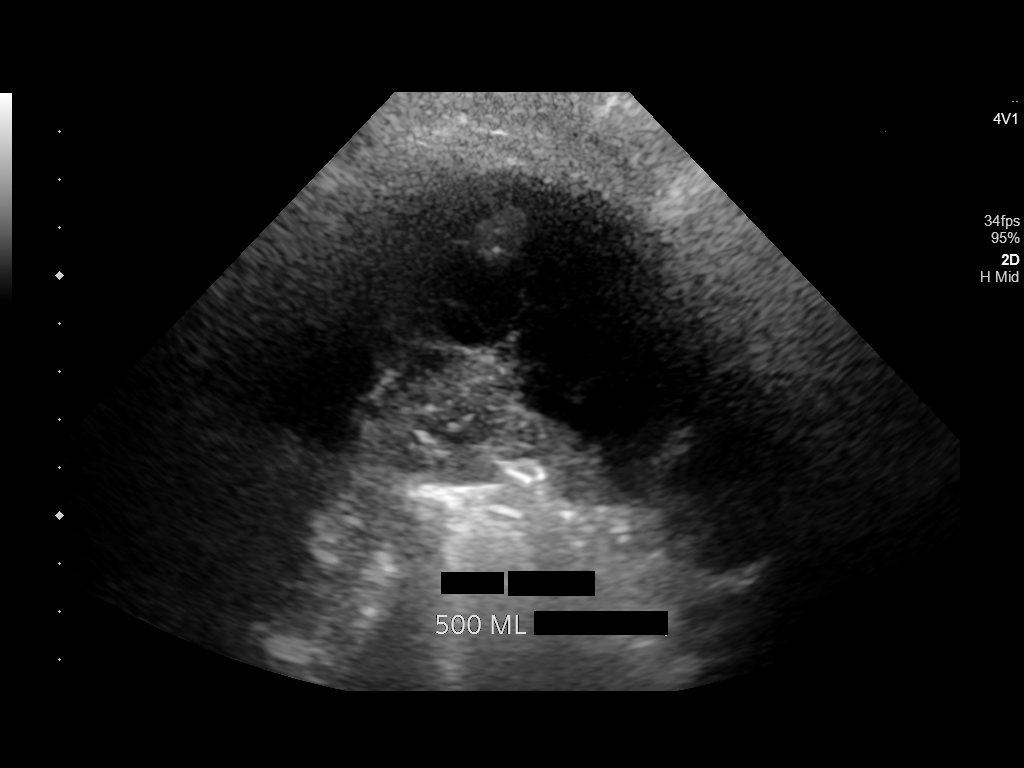

[5 of 5 positions shown; findings below may reference images not displayed]

Initial ultrasound scanning demonstrates a small to moderate sized
complex/loculated left-sided pleural effusion. The lower chest was
prepped and draped in the usual sterile fashion. 1% lidocaine was
used for local anesthesia. An ultrasound image was saved for
documentation purposes. An 8 Fr Safe-T-Centesis catheter was
introduced. The thoracentesis was performed. The catheter was
removed and a dressing was applied. The patient tolerated the
procedure well without immediate post procedural complication. The
patient was escorted to have an upright chest radiograph.
FINDINGS: A total of approximately 500 cc of slightly brown-colored serous
fluid was removed. Requested samples were sent to the laboratory.
IMPRESSION: Successful ultrasound-guided left sided thoracentesis yielding 500
cc of slightly brown colored serous pleural fluid.

## 2021-12-13 IMAGING — MR MR LUMBAR SPINE W/O CM
5 series · 31 of 48 positions shown · non-contrast
Comparison: Abdominopelvic CT 05/18/2019. Chest CT 05/15/2019.

CLINICAL DATA: Sepsis. Irrigation and drainage of right flank
abscess yesterday.

EXAM:
MRI THORACIC AND LUMBAR SPINE WITHOUT CONTRAST
TECHNIQUE: Multiplanar and multiecho pulse sequences of the thoracic and lumbar
spine were obtained without intravenous contrast.

[Series 1: T2 · sagittal · 4.0mm · 1.02mm/px · 6 of 17 slices shown (1 of 2)]
[im 1/17]
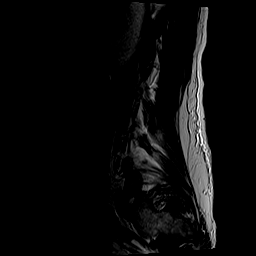
[im 4/17]
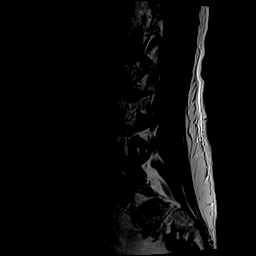
[im 7/17]
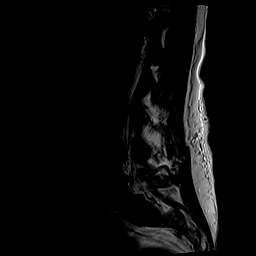
[im 10/17]
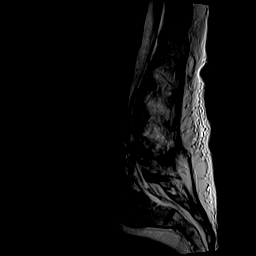
[im 13/17]
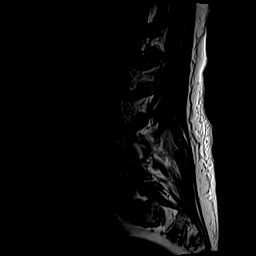
[im 17/17]
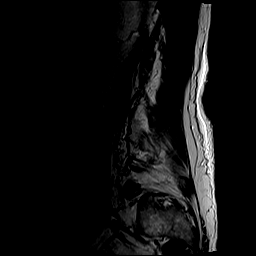

[Series 2: T1 · sagittal · 4.0mm · 1.02mm/px · 7 of 17 slices shown (1 of 2)]
[im 1/17]
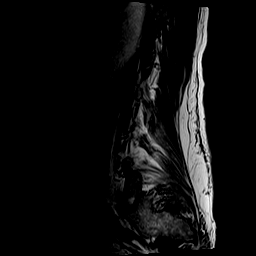
[im 3/17]
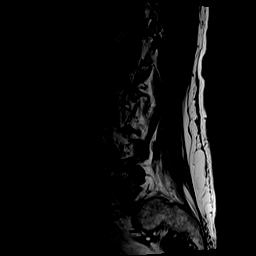
[im 6/17]
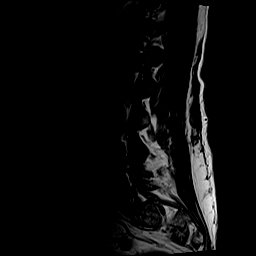
[im 9/17]
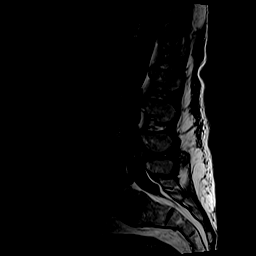
[im 11/17]
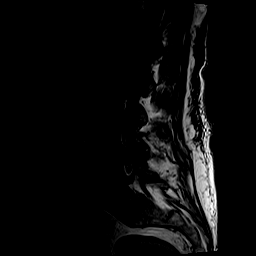
[im 14/17]
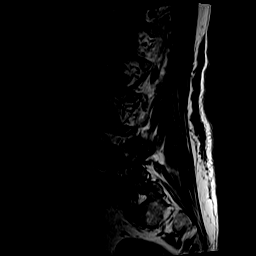
[im 17/17]
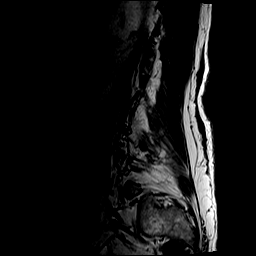

[Series 3: STIR · sagittal · 4.0mm · 0.51mm/px · 2 of 17 slices shown]
[im 1/17]
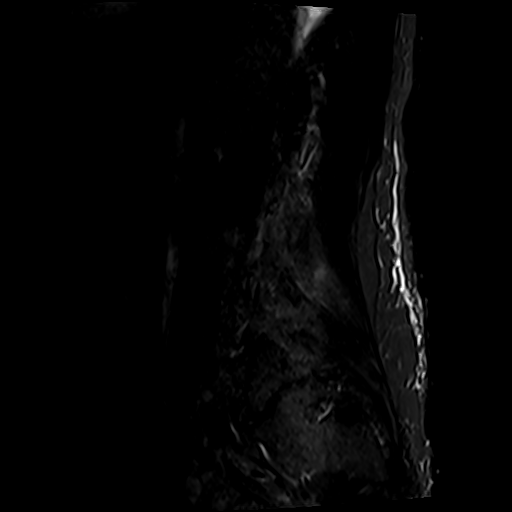
[im 3/17]
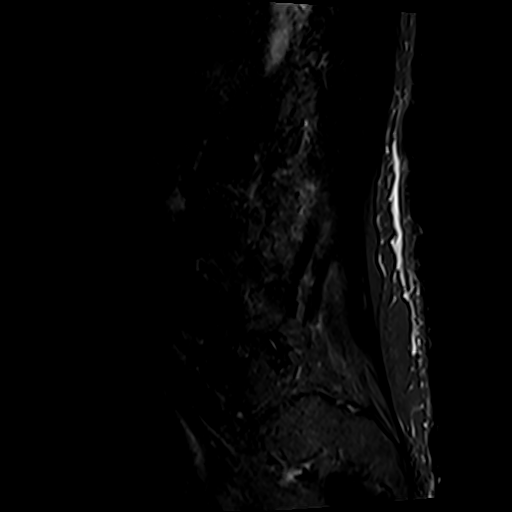

[Series 4: T2 · axial · 4.0mm · 0.78mm/px · z∈[-562,-341]mm · 8 of 37 slices shown (2 of 2)]
[im 1/37]
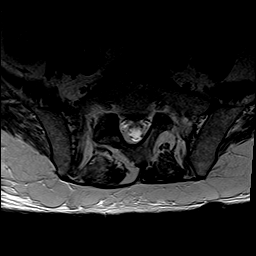
[im 6/37]
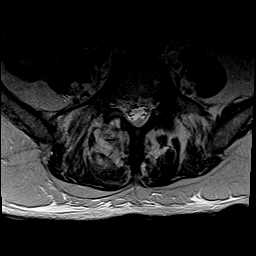
[im 12/37]
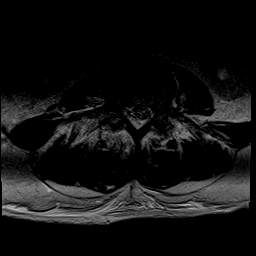
[im 17/37]
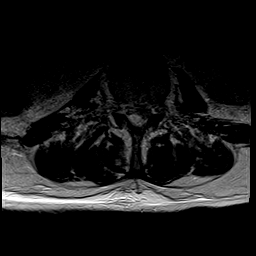
[im 20/37]
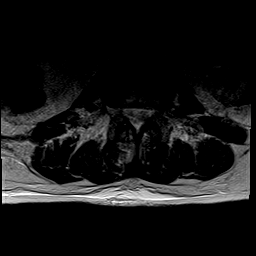
[im 25/37]
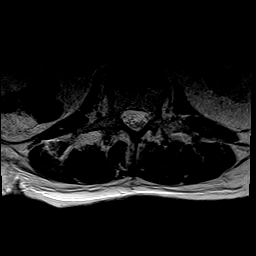
[im 31/37]
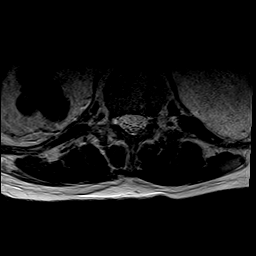
[im 37/37]
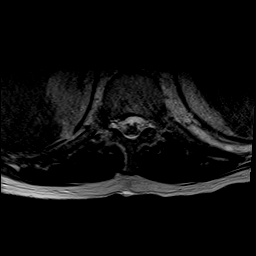

[Series 5: T1 · axial · 4.0mm · 0.39mm/px · z∈[-562,-341]mm · 8 of 37 slices shown (2 of 2)]
[im 1/37]
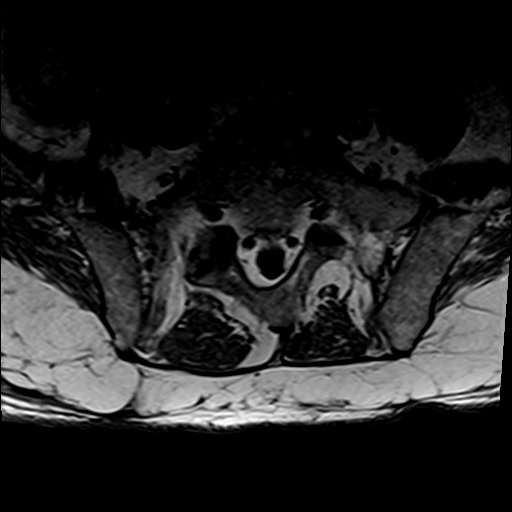
[im 6/37]
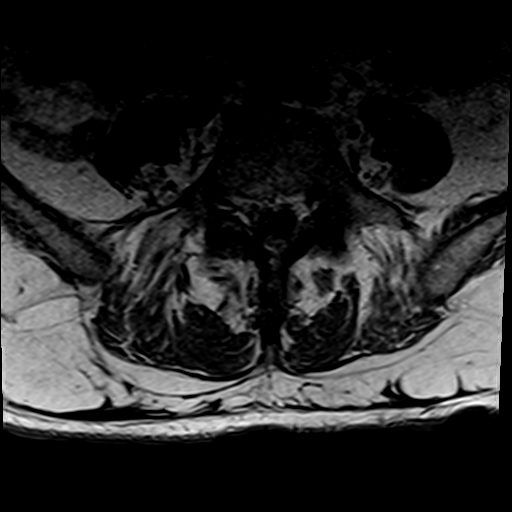
[im 12/37]
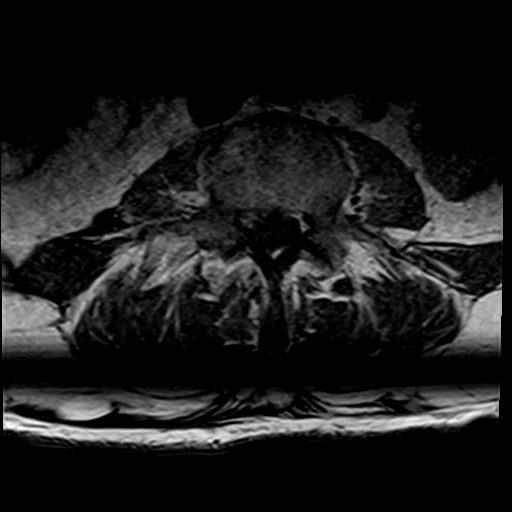
[im 17/37]
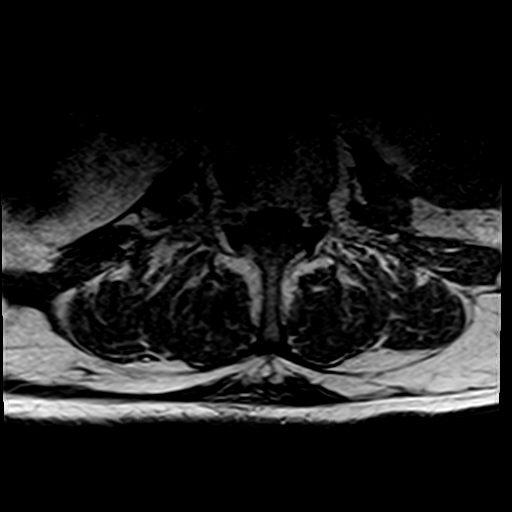
[im 20/37]
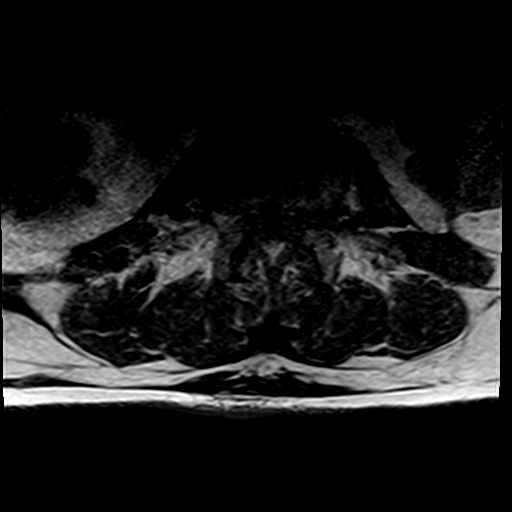
[im 25/37]
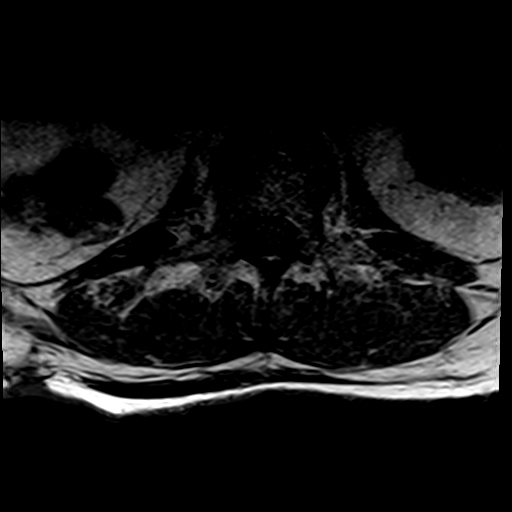
[im 31/37]
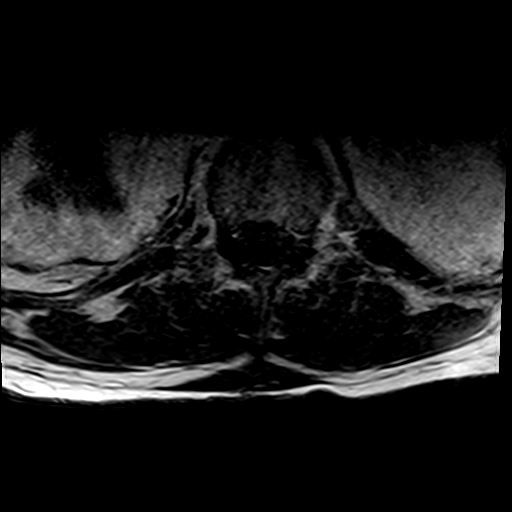
[im 37/37]
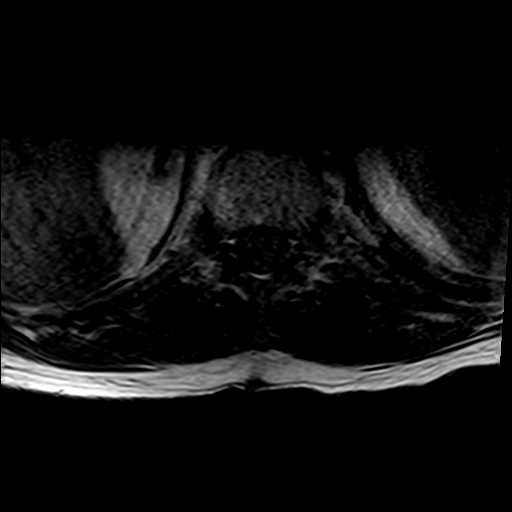

[31 of 48 positions shown; findings below may reference images not displayed]

FINDINGS: MRI THORACIC SPINE FINDINGS

Alignment:  Normal.

Vertebrae: There is a hemangioma within the left aspect of the T7
vertebral body. No evidence of acute fracture, subluxation, discitis
or osteomyelitis. The localizing images demonstrate spondylosis in
the lower cervical spine without apparent acute abnormality.

Cord: The thoracic cord appears normal in signal and caliber. Conus
medullaris extends to the L1 level.

Paraspinal and other soft tissues: No focal paraspinal abnormalities
are identified. There are left-greater-than-right pleural effusions
with dependent airspace opacities bilaterally. There are nodular
components in both lungs, also similar to previous CT.

Disc levels:

Detail is mildly degraded by motion artifact. No large disc
herniations are demonstrated. There is a small right paracentral
disc protrusion at T6-7. There is a slightly larger right
paracentral disc protrusion at T7-8. No cord deformity or
significant foraminal narrowing.

MRI LUMBAR SPINE FINDINGS

Segmentation: There is transitional lumbosacral anatomy. As numbered
from above, there is a transitional S1 segment with a vestigial S1-2
disc space.

Alignment:  Normal.

Vertebrae: There is no evidence of discitis or vertebral body
osteomyelitis. However, the right L4-5 facet joint is widened with a
joint effusion and surrounding soft tissue edema. There is a
posterior epidural fluid collection which extends superiorly from
the L4-5 facet joint to the upper L3 level. On the sagittal images,
this extends approximately 7.3 cm in length (image [DATE]). This fluid
collection measures up to 14 x 9 mm transverse (image [DATE]) and is
highly suspicious for an epidural abscess. There is some mass effect
on the thecal sac. No apparent cortical destruction of the posterior
elements.

Conus medullaris and cauda equina: Conus extends to the L1 level.

Paraspinal and other soft tissues: As above, there are paraspinal
inflammatory changes around the right L4-5 facet joint with a
posterior epidural fluid collection suspicious for an abscess. No
paraspinal abscesses are identified.

Disc levels:

No significant disc space findings at or above L3-4. As described
above, the posterior epidural abscess extends superiorly to the
level of the upper L3 vertebral body.

L4-5: Mild disc bulging. As above, asymmetric right facet joint
effusion with paraspinal and posterior right epidural fluid
collections highly worrisome for infection. No significant foraminal
compromise.

L5-S1: Loss of disc height with annular disc bulging and a
broad-based central disc protrusion. Mild narrowing of the right
lateral recess and mild bilateral facet hypertrophy. The foramina
are sufficiently patent.

S1-2: Transitional disc space level appears unremarkable.
IMPRESSION: 1. Transitional lumbosacral anatomy. As numbered from above, there
is a transitional S1 segment with a vestigial S1-2 disc space.
2. Probable septic right L4-5 facet joint with paraspinal
inflammatory changes and a posterior epidural abscess extending
superiorly from the L4-5 facet joint to the upper L3 level. There is
some mass effect on the thecal sac.
3. No paraspinal abscesses identified.
4. No evidence of discitis or vertebral body osteomyelitis.
5. Bilateral pleural effusions with nodular components bilaterally
consistent with septic emboli, similar to previous CT.
6. These results were called by telephone at the time of
interpretation on 05/21/2019 at [DATE] to provider NAIKEN RULZ ,
who verbally acknowledged these results.

## 2021-12-13 IMAGING — CR DG CHEST 1V
1 series · 1 of 1 positions shown · non-contrast
Comparison: Radiographs 05/19/2019 and 05/15/2019. CT 05/15/2019.

CLINICAL DATA: Increasing shortness of breath. Smoker. History of
asthma/COPD.

EXAM:
CHEST  1 VIEW

[dg chest 1 view]
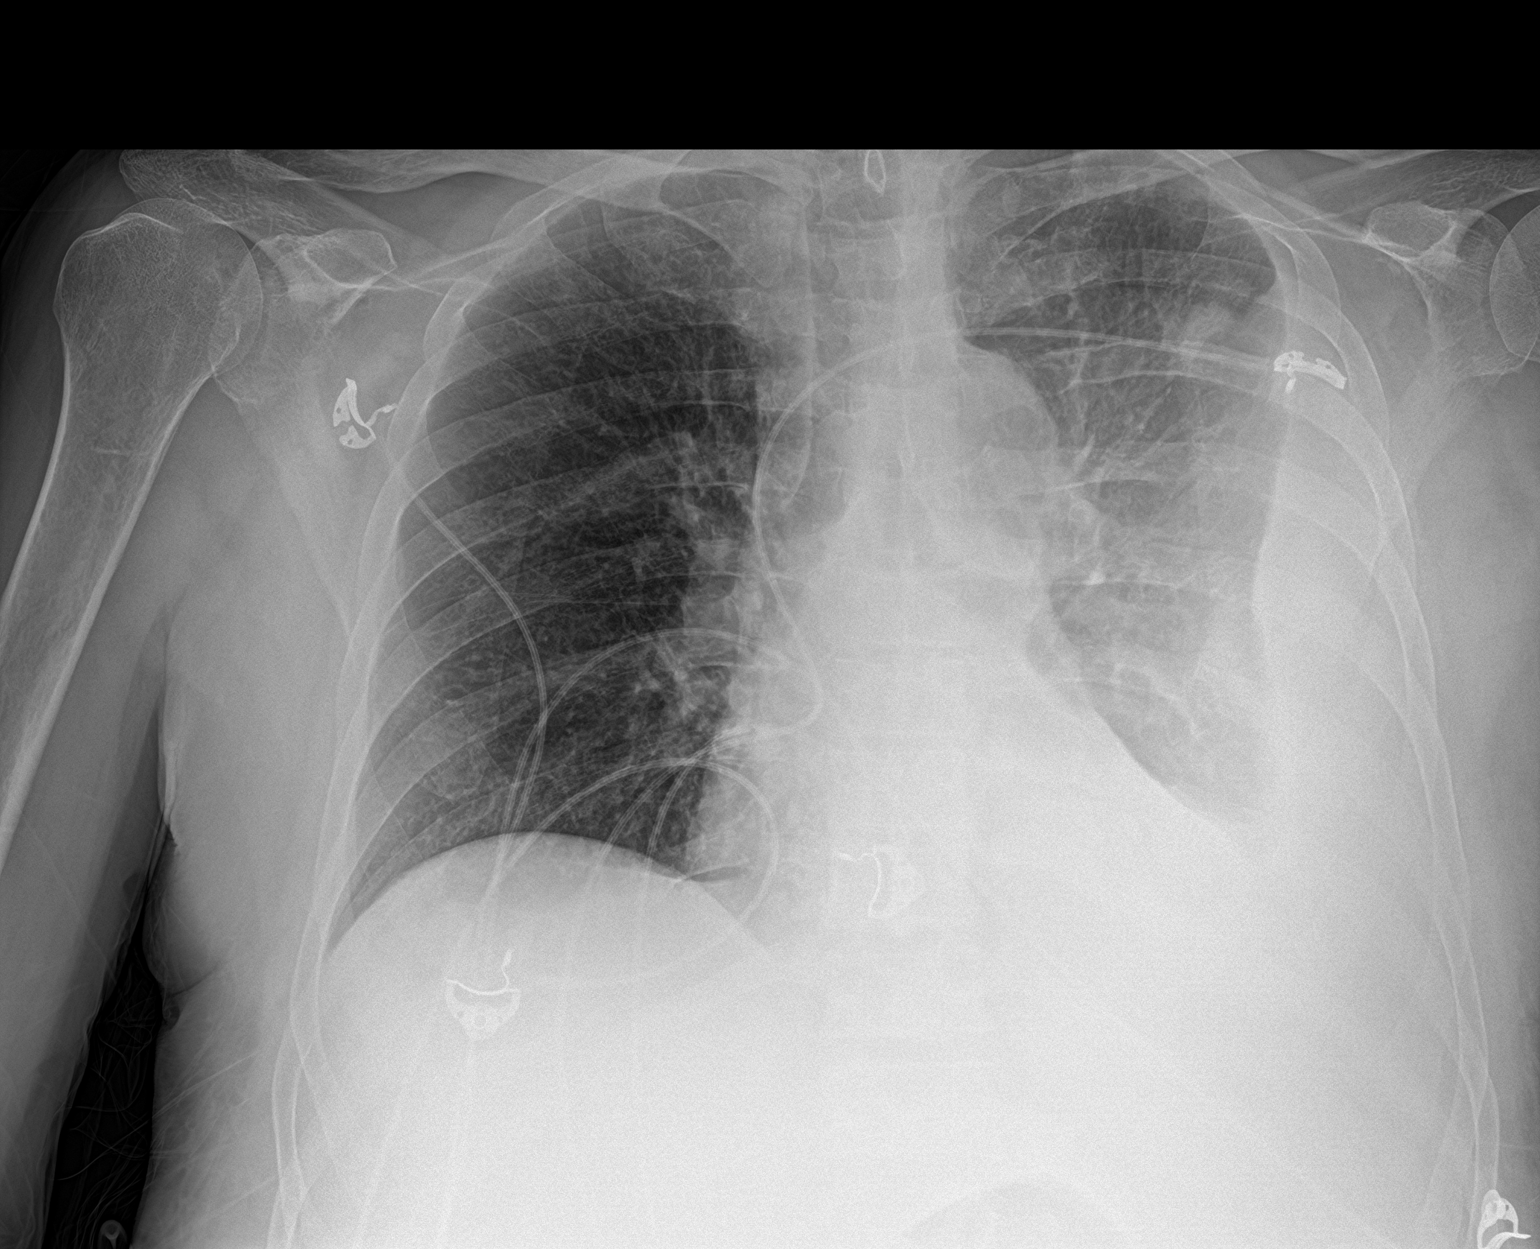

[1 of 1 positions shown; findings below may reference images not displayed]

FINDINGS: 6457 hours. Left pleural effusion has partially reaccumulated and
appears loculated laterally. The underlying cavitary lesions in the
left lung are partially obscured, although grossly stable. There is
increased compressive left lung atelectasis. The right lung is
clear. There is no pneumothorax. The heart size and mediastinal
contours are stable.
IMPRESSION: 1. Interval partial reaccumulation of loculated left pleural
effusion.
2. No pneumothorax.

## 2021-12-13 IMAGING — MR MR THORACIC SPINE W/O CM
6 of 7 series · 33 of 48 positions shown · non-contrast
Comparison: Abdominopelvic CT 05/18/2019. Chest CT 05/15/2019.

CLINICAL DATA: Sepsis. Irrigation and drainage of right flank
abscess yesterday.

EXAM:
MRI THORACIC AND LUMBAR SPINE WITHOUT CONTRAST
TECHNIQUE: Multiplanar and multiecho pulse sequences of the thoracic and lumbar
spine were obtained without intravenous contrast.

[Series 16: T1 · sagittal · 5.0mm · 1.41mm/px · 2 of 9 slices shown (1 of 2)]
[im 1/9]
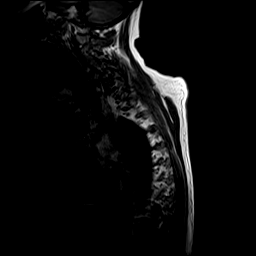
[im 9/9]
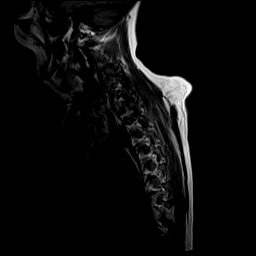

[Series 17: T2 · sagittal · 3.0mm · 1.33mm/px · 6 of 21 slices shown (1 of 2)]
[im 1/21]
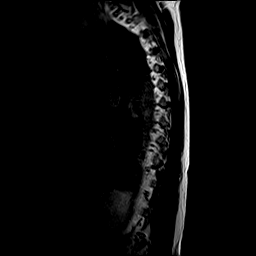
[im 5/21]
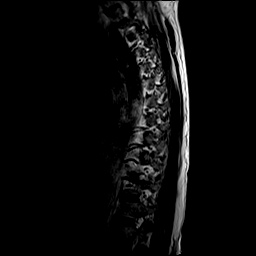
[im 9/21]
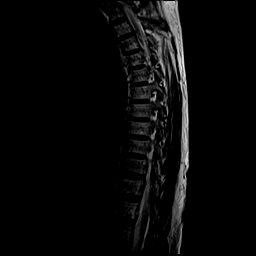
[im 13/21]
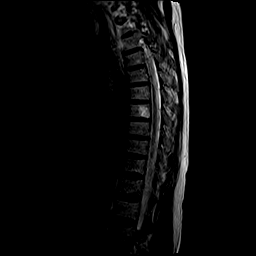
[im 17/21]
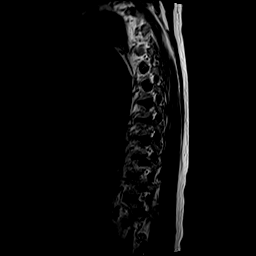
[im 21/21]
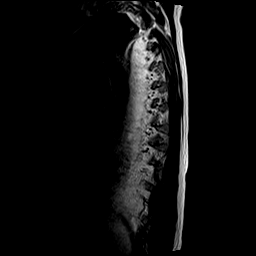

[Series 18: T1 · sagittal · 3.0mm · 1.33mm/px · 6 of 21 slices shown (2 of 2)]
[im 1/21]
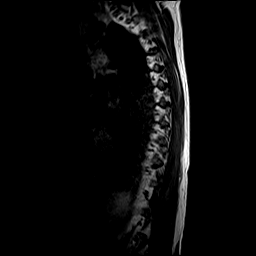
[im 5/21]
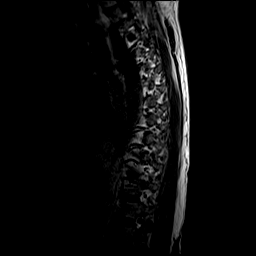
[im 9/21]
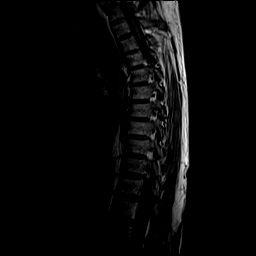
[im 13/21]
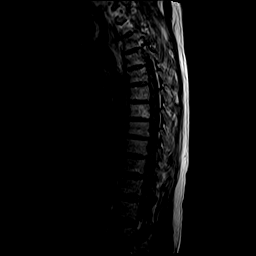
[im 17/21]
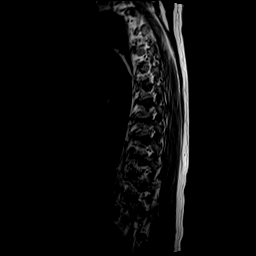
[im 21/21]
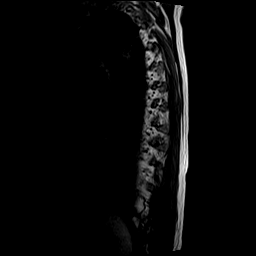

[Series 19: STIR · sagittal · 3.0mm · 0.66mm/px · 6 of 21 slices shown (1 of 2)]
[im 1/21]
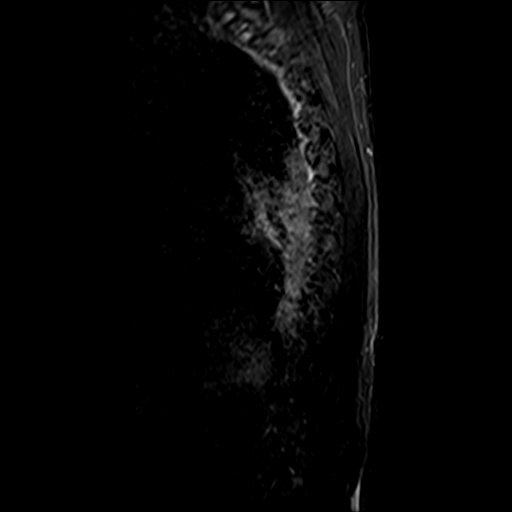
[im 5/21]
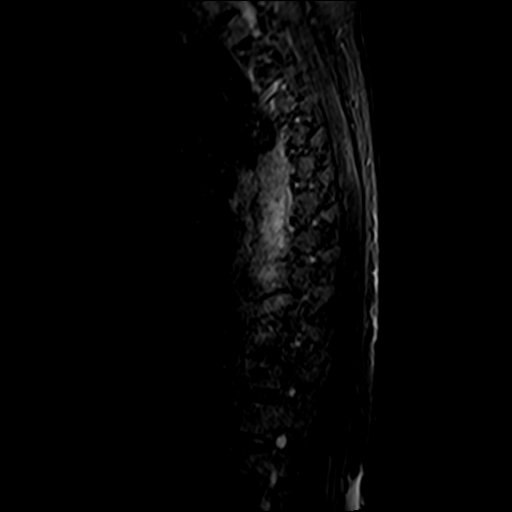
[im 9/21]
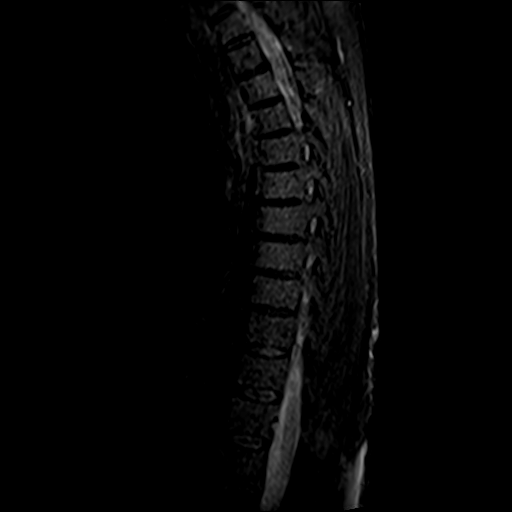
[im 13/21]
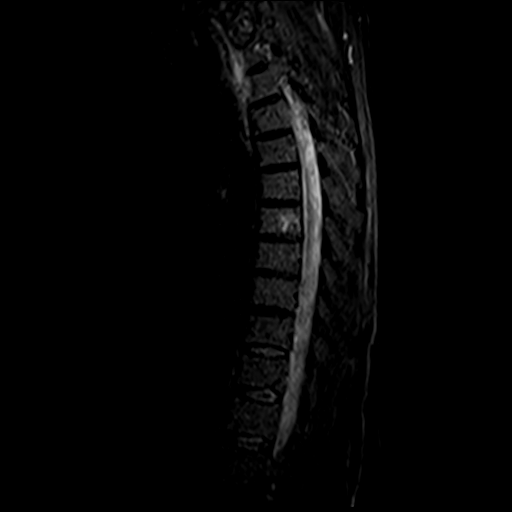
[im 17/21]
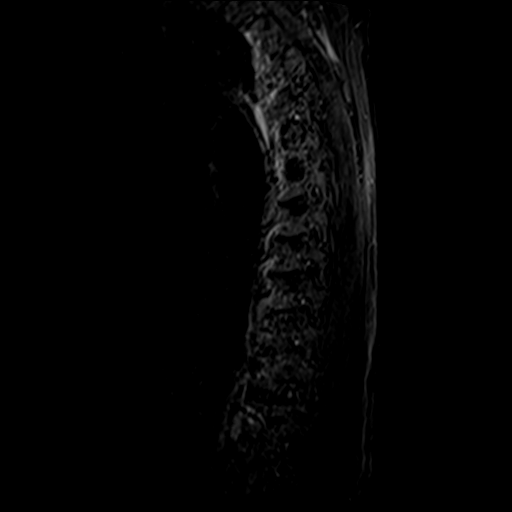
[im 21/21]
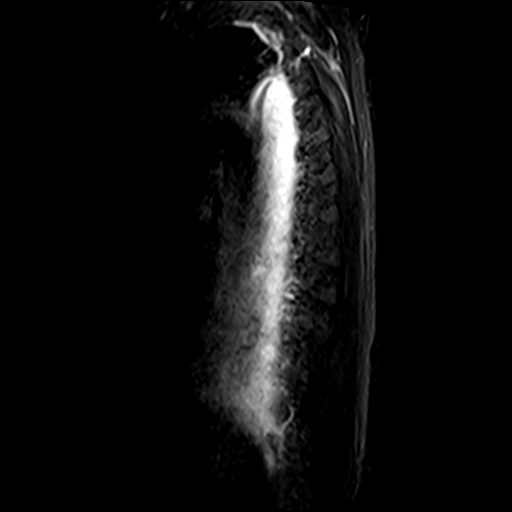

[Series 20: T2 · axial · 4.0mm · 0.59mm/px · z∈[-360,-133]mm · 11 of 39 slices shown (2 of 2)]
[im 1/39]
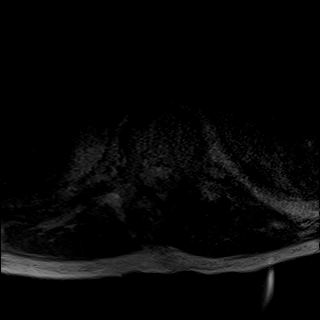
[im 4/39]
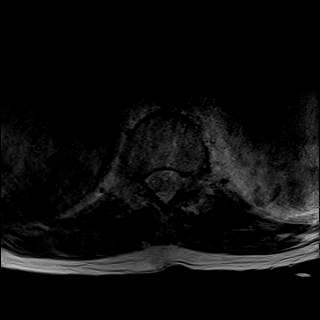
[im 8/39]
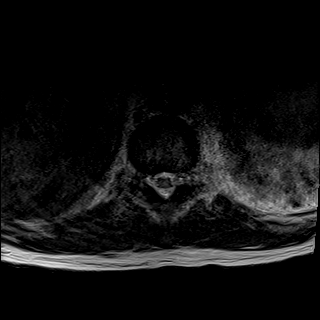
[im 12/39]
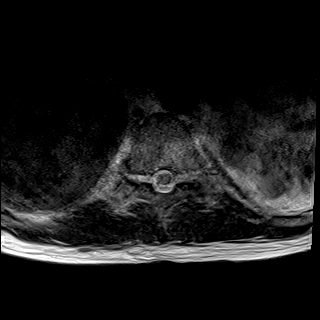
[im 16/39]
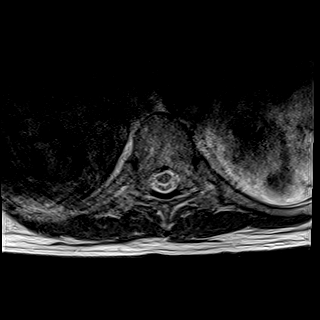
[im 20/39]
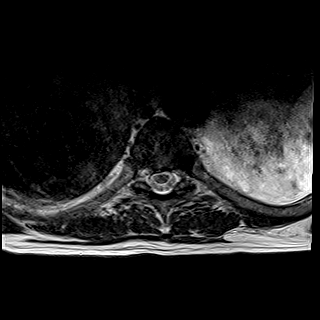
[im 23/39]
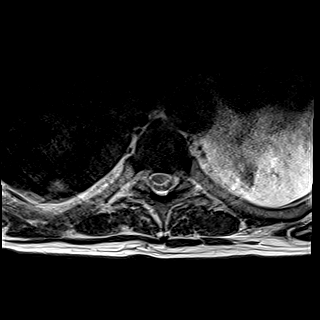
[im 27/39]
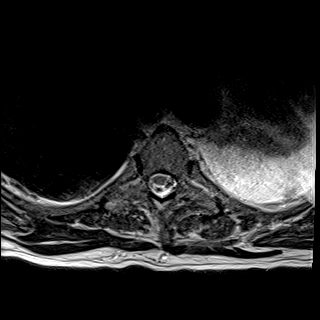
[im 31/39]
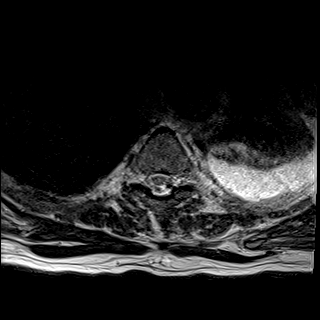
[im 35/39]
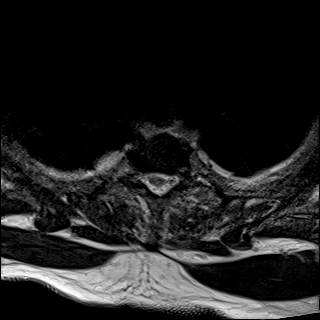
[im 39/39]
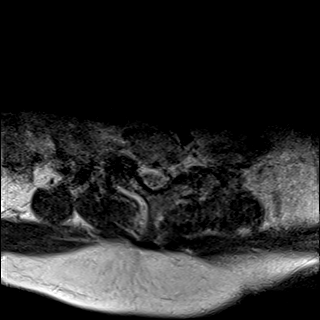

[Series 22: STIR · sagittal · 3.0mm · 0.66mm/px · 2 of 21 slices shown (2 of 2)]
[im 1/21]
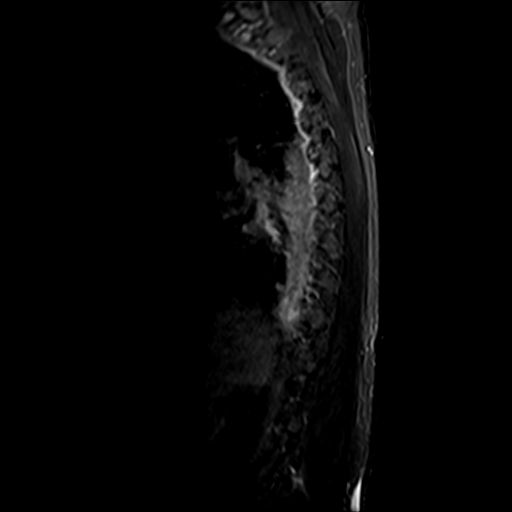
[im 5/21]
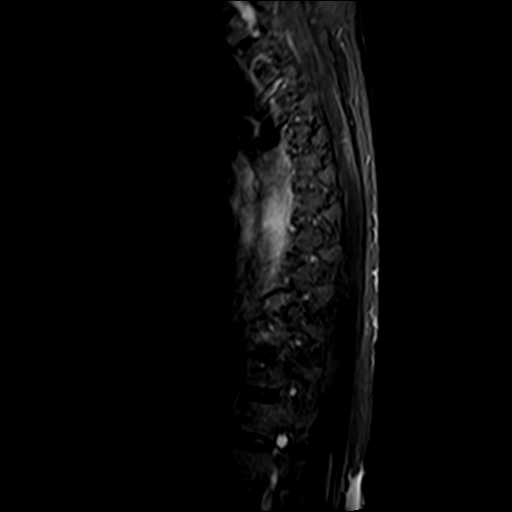

[33 of 48 positions shown; findings below may reference images not displayed]

FINDINGS: MRI THORACIC SPINE FINDINGS

Alignment:  Normal.

Vertebrae: There is a hemangioma within the left aspect of the T7
vertebral body. No evidence of acute fracture, subluxation, discitis
or osteomyelitis. The localizing images demonstrate spondylosis in
the lower cervical spine without apparent acute abnormality.

Cord: The thoracic cord appears normal in signal and caliber. Conus
medullaris extends to the L1 level.

Paraspinal and other soft tissues: No focal paraspinal abnormalities
are identified. There are left-greater-than-right pleural effusions
with dependent airspace opacities bilaterally. There are nodular
components in both lungs, also similar to previous CT.

Disc levels:

Detail is mildly degraded by motion artifact. No large disc
herniations are demonstrated. There is a small right paracentral
disc protrusion at T6-7. There is a slightly larger right
paracentral disc protrusion at T7-8. No cord deformity or
significant foraminal narrowing.

MRI LUMBAR SPINE FINDINGS

Segmentation: There is transitional lumbosacral anatomy. As numbered
from above, there is a transitional S1 segment with a vestigial S1-2
disc space.

Alignment:  Normal.

Vertebrae: There is no evidence of discitis or vertebral body
osteomyelitis. However, the right L4-5 facet joint is widened with a
joint effusion and surrounding soft tissue edema. There is a
posterior epidural fluid collection which extends superiorly from
the L4-5 facet joint to the upper L3 level. On the sagittal images,
this extends approximately 7.3 cm in length (image [DATE]). This fluid
collection measures up to 14 x 9 mm transverse (image [DATE]) and is
highly suspicious for an epidural abscess. There is some mass effect
on the thecal sac. No apparent cortical destruction of the posterior
elements.

Conus medullaris and cauda equina: Conus extends to the L1 level.

Paraspinal and other soft tissues: As above, there are paraspinal
inflammatory changes around the right L4-5 facet joint with a
posterior epidural fluid collection suspicious for an abscess. No
paraspinal abscesses are identified.

Disc levels:

No significant disc space findings at or above L3-4. As described
above, the posterior epidural abscess extends superiorly to the
level of the upper L3 vertebral body.

L4-5: Mild disc bulging. As above, asymmetric right facet joint
effusion with paraspinal and posterior right epidural fluid
collections highly worrisome for infection. No significant foraminal
compromise.

L5-S1: Loss of disc height with annular disc bulging and a
broad-based central disc protrusion. Mild narrowing of the right
lateral recess and mild bilateral facet hypertrophy. The foramina
are sufficiently patent.

S1-2: Transitional disc space level appears unremarkable.
IMPRESSION: 1. Transitional lumbosacral anatomy. As numbered from above, there
is a transitional S1 segment with a vestigial S1-2 disc space.
2. Probable septic right L4-5 facet joint with paraspinal
inflammatory changes and a posterior epidural abscess extending
superiorly from the L4-5 facet joint to the upper L3 level. There is
some mass effect on the thecal sac.
3. No paraspinal abscesses identified.
4. No evidence of discitis or vertebral body osteomyelitis.
5. Bilateral pleural effusions with nodular components bilaterally
consistent with septic emboli, similar to previous CT.
6. These results were called by telephone at the time of
interpretation on 05/21/2019 at [DATE] to provider NAIKEN RULZ ,
who verbally acknowledged these results.

## 2021-12-14 IMAGING — DX DG CHEST 1V PORT
1 series · 1 of 1 positions shown · non-contrast
Comparison: 05/21/2019

CLINICAL DATA: Shortness of breath

EXAM:
PORTABLE CHEST 1 VIEW

[chest ap]
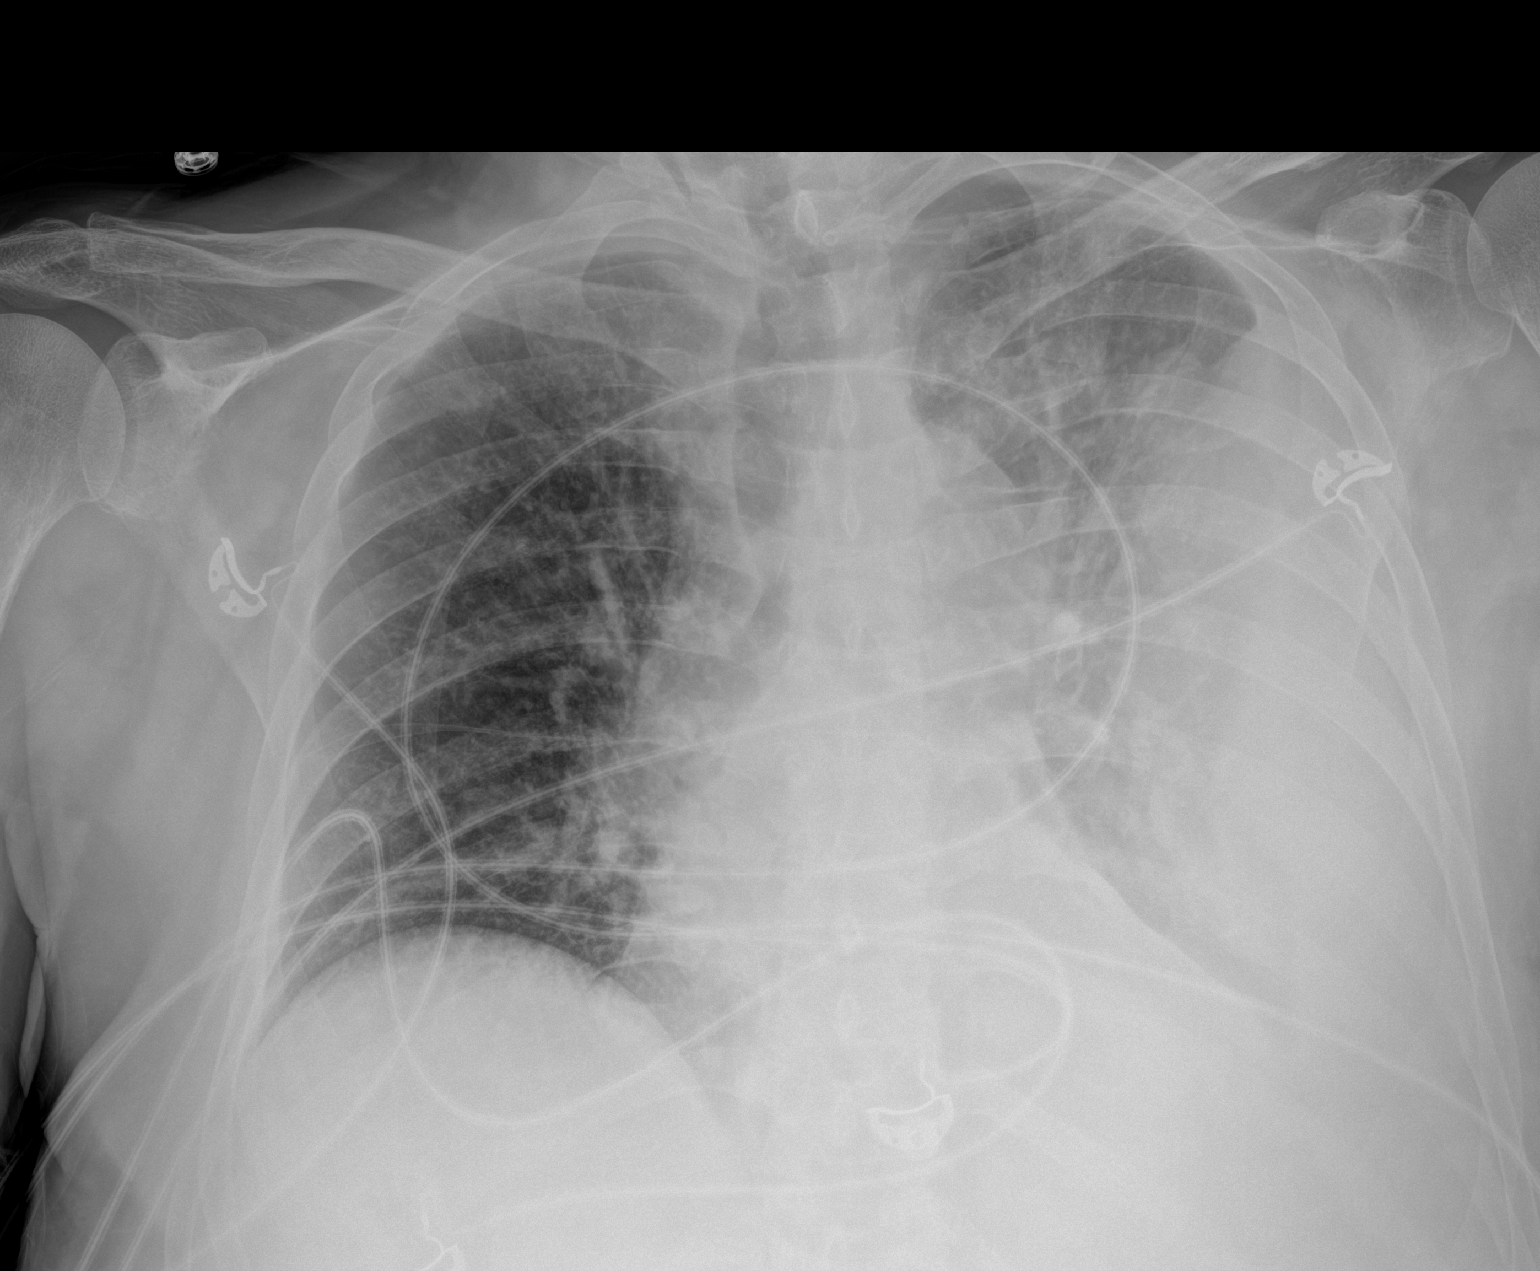

[1 of 1 positions shown; findings below may reference images not displayed]

FINDINGS: Parents slight increase in moderate to large loculated LEFT pleural
effusion noted with increasing LEFT lung opacities/atelectasis.

Slightly increasing mild interstitial opacities within the RIGHT
lung noted.

No evidence of pneumothorax.

No other significant change.
IMPRESSION: 1. Slight increase in moderate to large loculated LEFT pleural
effusion with increasing LEFT lung opacities/atelectasis.
2. Slightly increasing mild interstitial opacities within the RIGHT
lung which may represent edema or infection.

## 2021-12-14 IMAGING — DX DG CHEST 1V PORT
1 series · 1 of 1 positions shown · non-contrast
Comparison: May 22, 2019

CLINICAL DATA: Status post left chest tube placement

EXAM:
PORTABLE CHEST 1 VIEW

[chest ap]
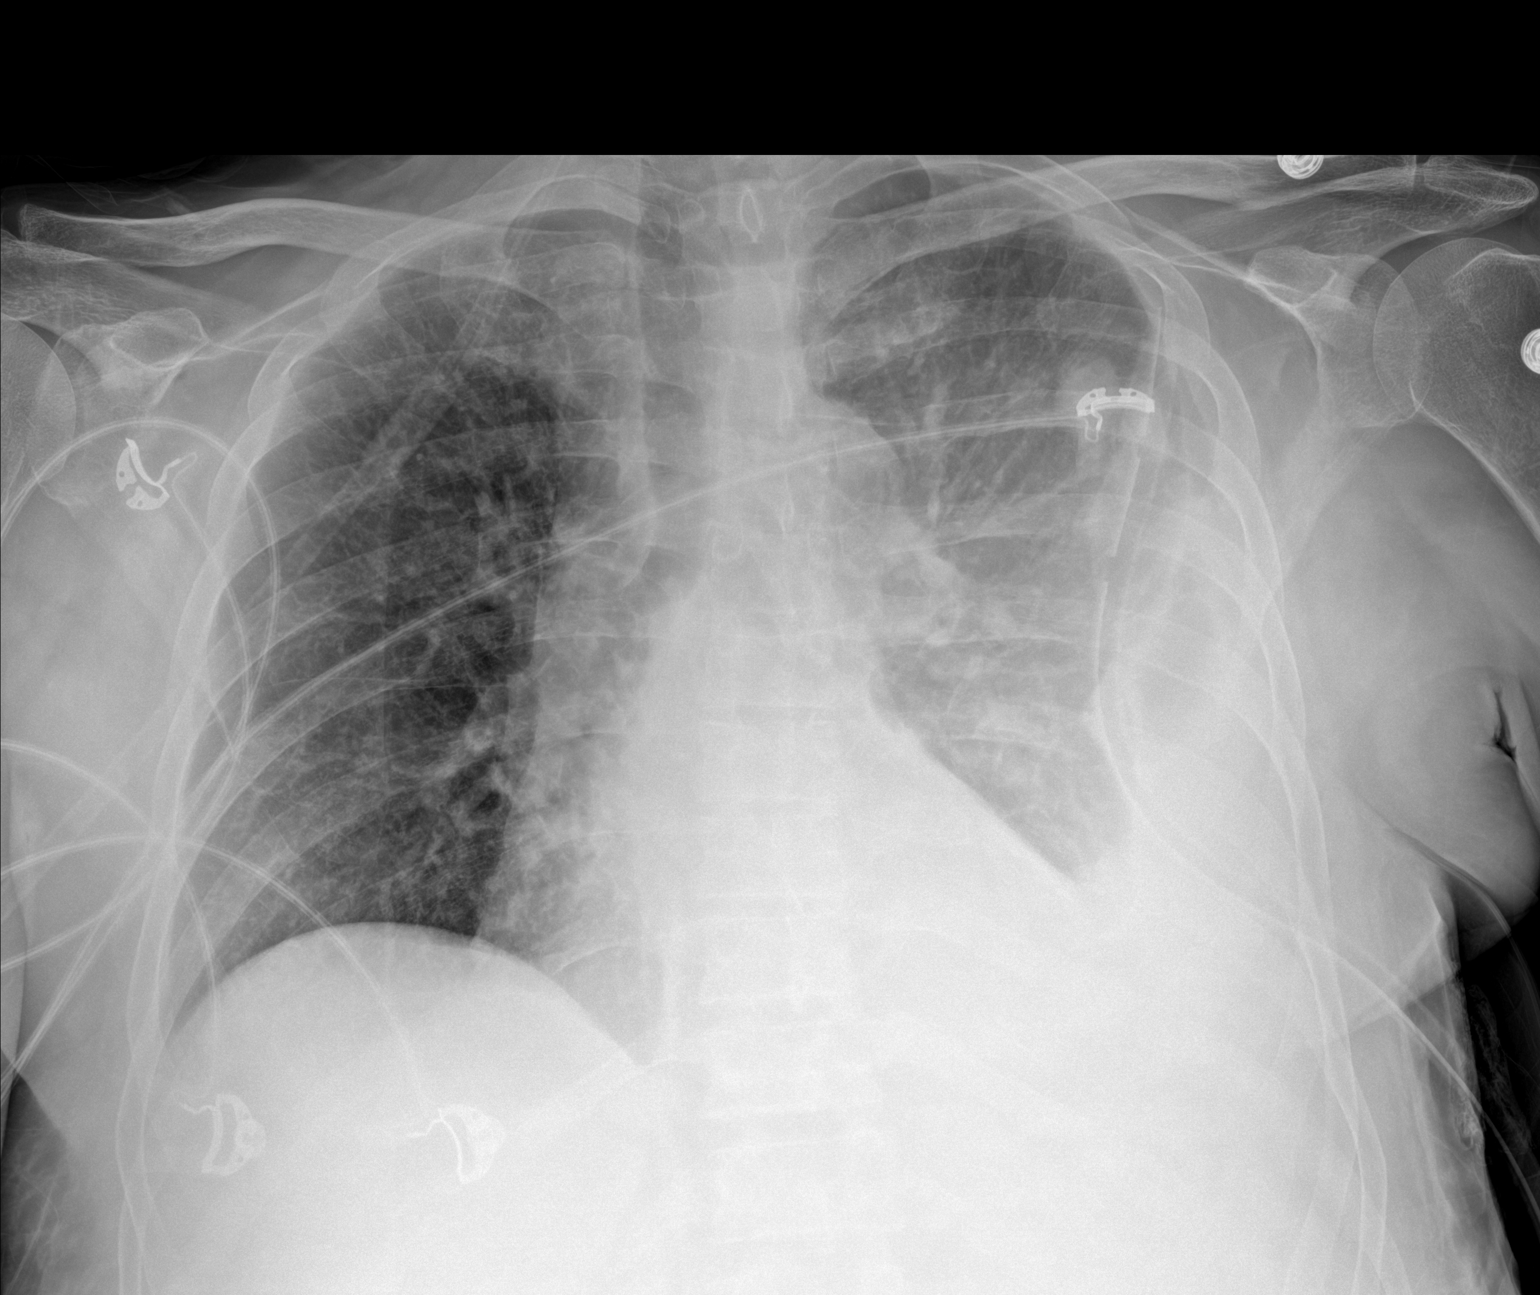

[1 of 1 positions shown; findings below may reference images not displayed]

FINDINGS: A left chest tube has been placed in the interval. The left-sided
pleural effusion remains but is smaller. There may be some air in
the pleural space on the left, introduced from the chest tube
placement. No other evidence of pneumothorax. No other acute
abnormalities. Opacity underlying the effusion remains.
IMPRESSION: A left-sided chest tube has been placed and the left-sided pleural
effusion remains but is smaller. There may be some air in the left
pleural space, introduced from the chest tube placement. Opacity
underlying the left-sided effusion remains, likely atelectasis.

## 2021-12-15 IMAGING — DX DG CHEST 1V PORT
1 series · 1 of 1 positions shown · non-contrast
Comparison: Radiograph yesterday. CT yesterday.

CLINICAL DATA: Empyema.

EXAM:
PORTABLE CHEST 1 VIEW

[chest ap]
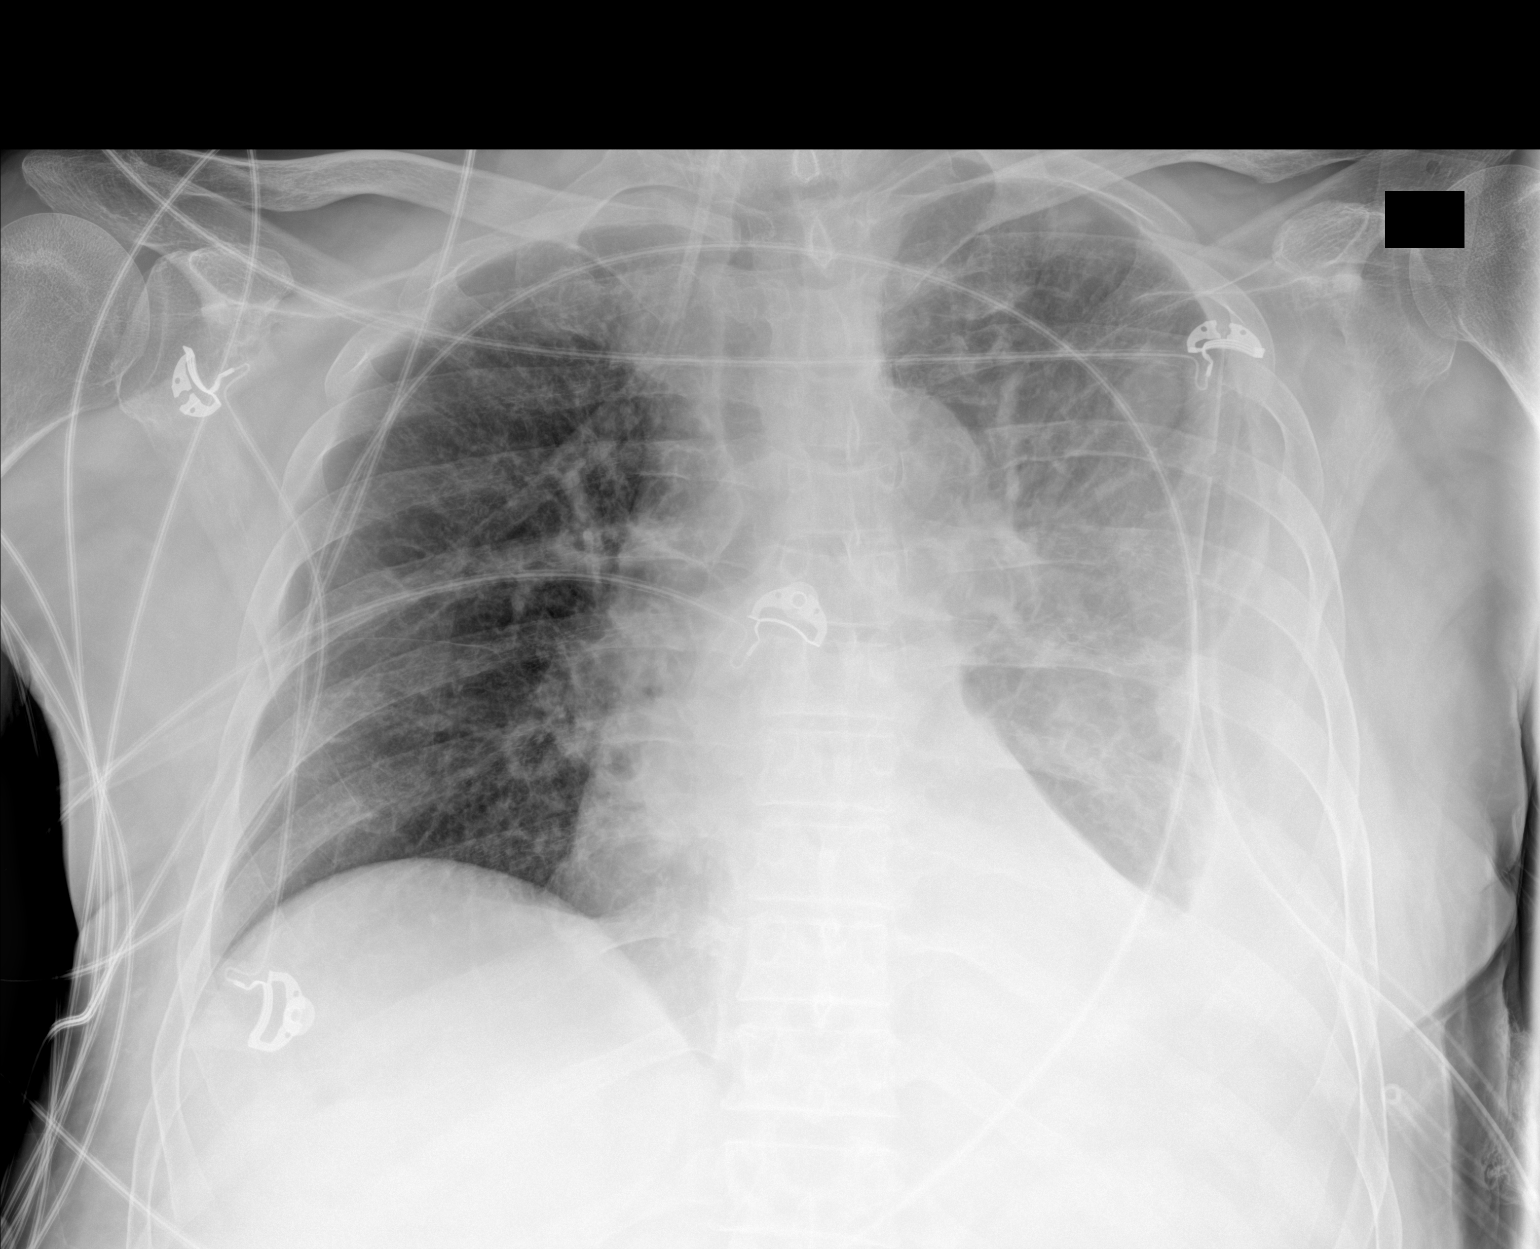

[1 of 1 positions shown; findings below may reference images not displayed]

FINDINGS: Left-sided chest tube with tip directed laterally towards the upper
lung zone. Decreased left pleural effusion from prior exam.
Pulmonary opacities on the left with upper lobe nodules. Pulmonary
nodules on the right on CT are not well visualized radiographically.
No pneumothorax.
IMPRESSION: 1. Left chest tube in place with decreased left pleural effusion.
2. Persistent opacities throughout the left hemithorax, some of
which are nodular in correspond to nodules on CT.

## 2021-12-15 IMAGING — US US RENAL
1 series · 14 of 25 positions shown · non-contrast
Comparison: CT abdomen and pelvis 05/18/2019

CLINICAL DATA: Acute kidney injury

EXAM:
RENAL / URINARY TRACT ULTRASOUND COMPLETE

[Series 1: us renal · 14 of 31 slices shown]
[im 1/31]
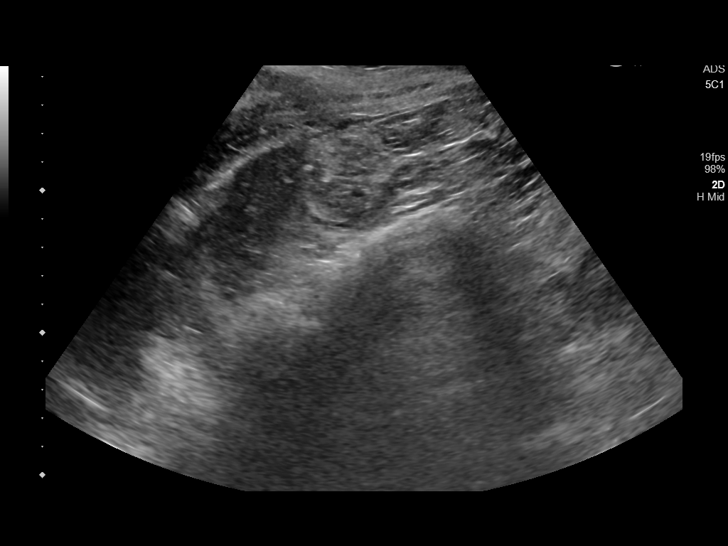
[im 3/31]
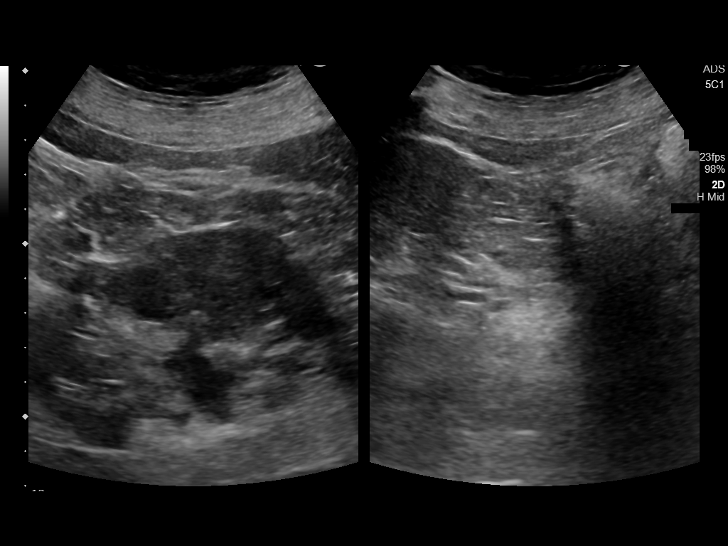
[im 6/31]
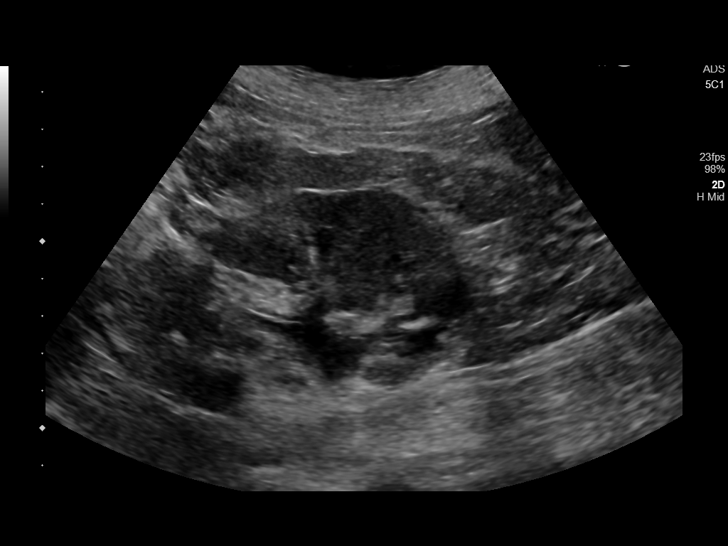
[im 8/31]
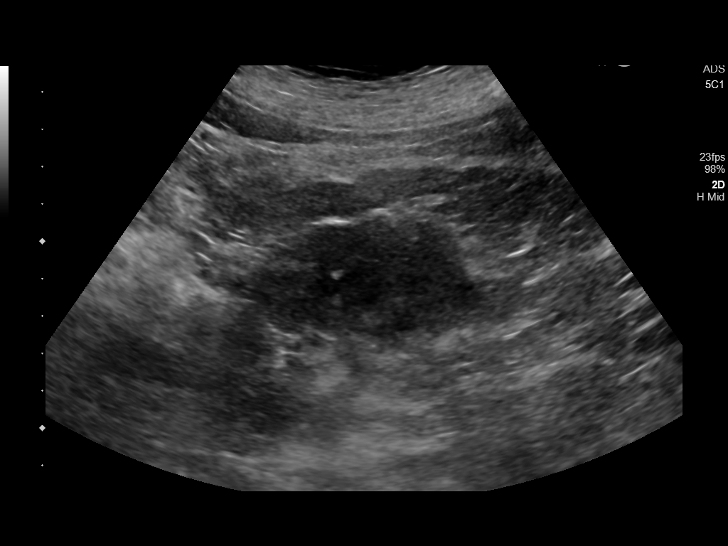
[im 11/31]
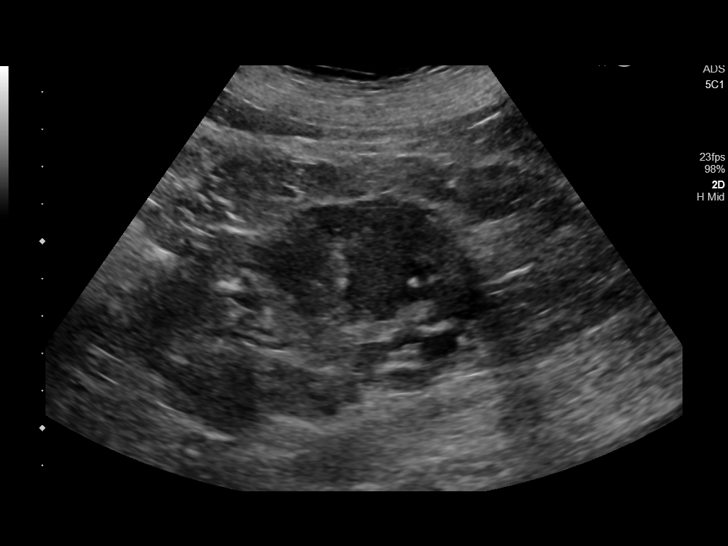
[im 12/31]
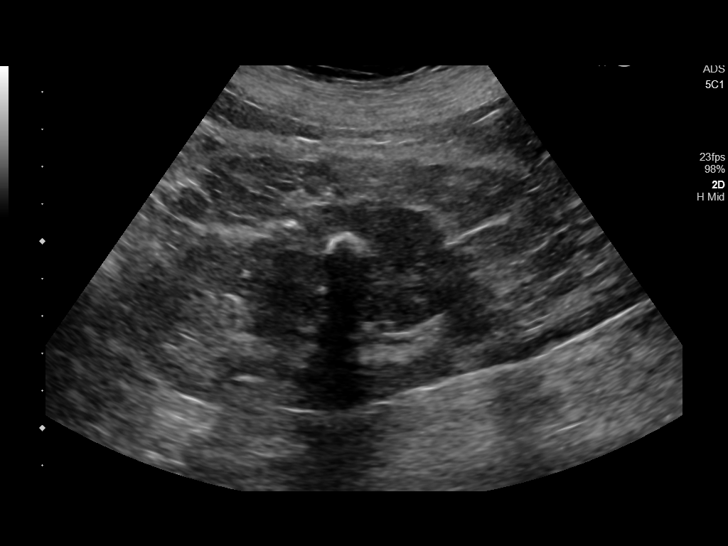
[im 14/31]
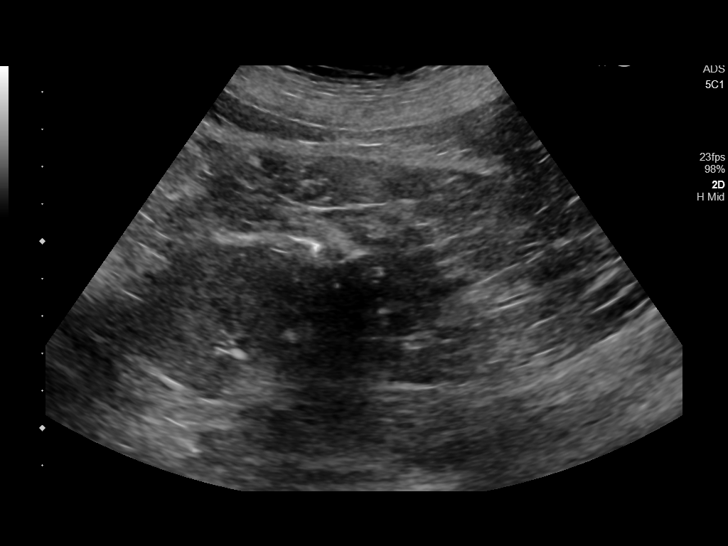
[im 17/31]
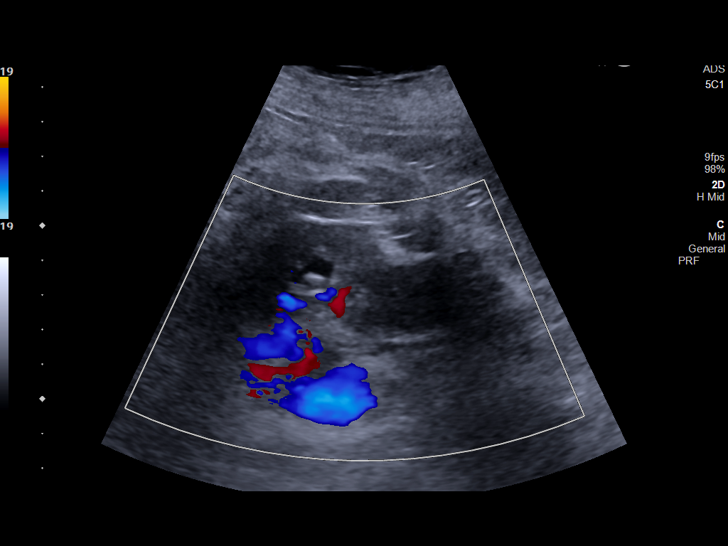
[im 19/31]
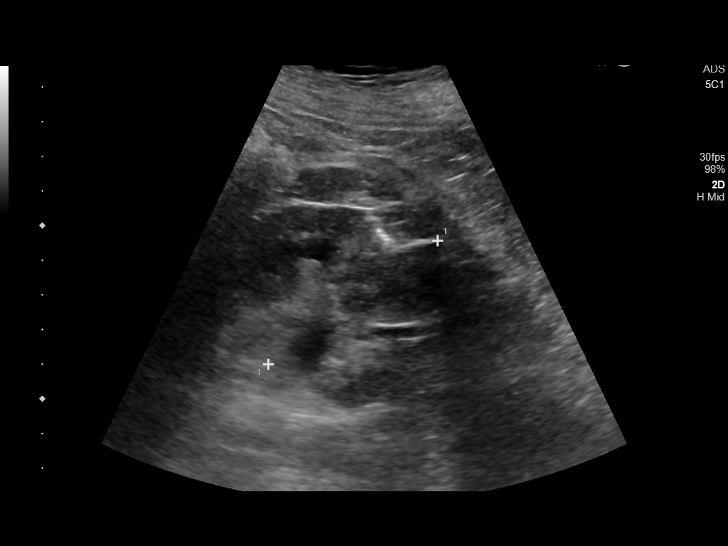
[im 21/31]
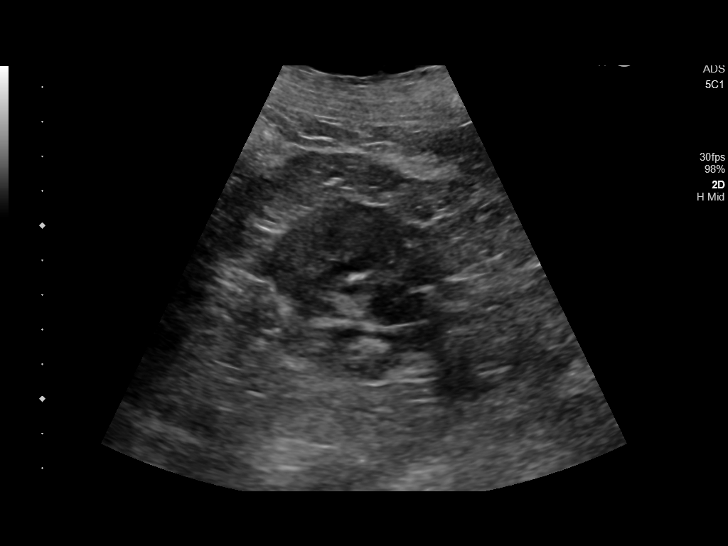
[im 23/31]
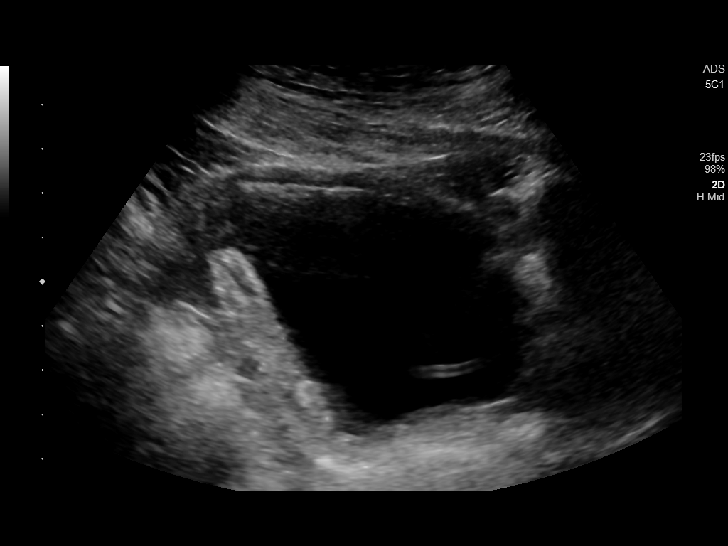
[im 26/31]
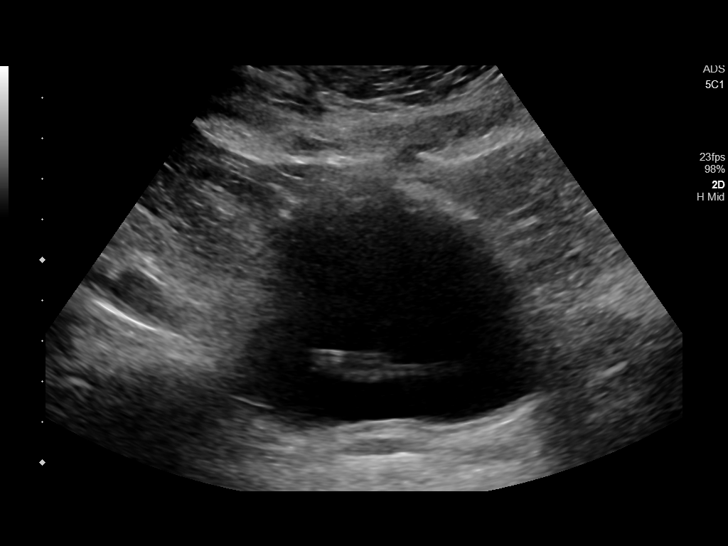
[im 28/31]
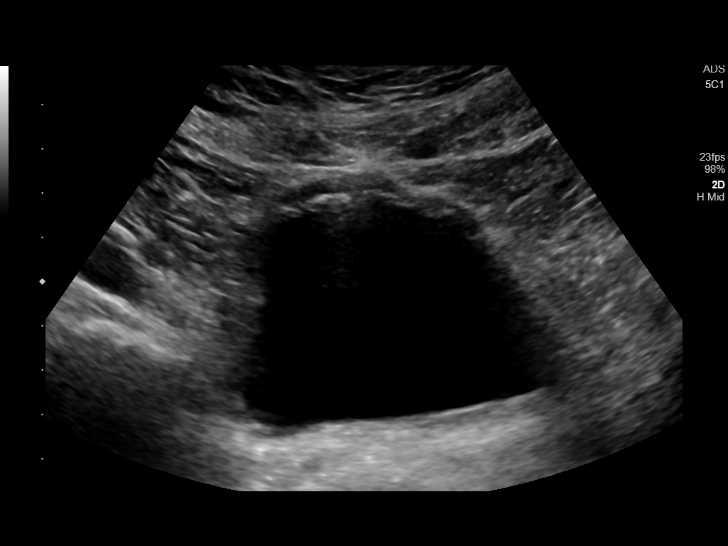
[im 31/31]
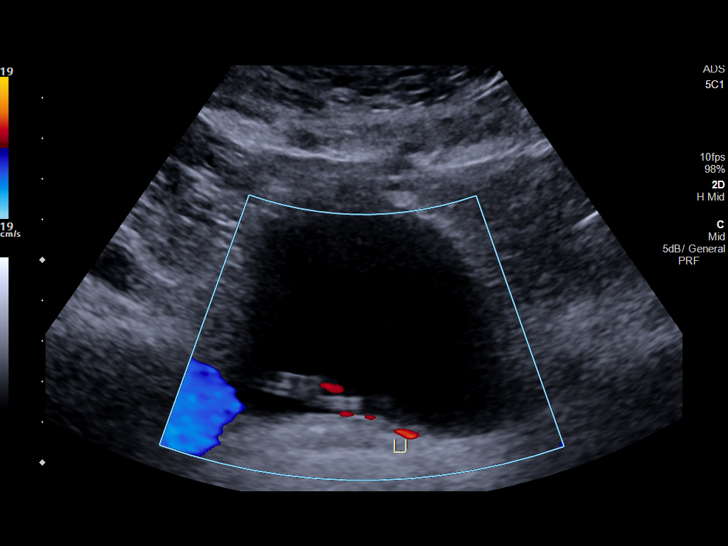

[14 of 25 positions shown; findings below may reference images not displayed]

FINDINGS: Right Kidney:

Renal measurements: Prior nephrectomy

Left Kidney:

Renal measurements: 9.6 x 5.9 x 6.0 cm = volume: 181 mL. Mild
pelviectasis/hydronephrosis. 12 mm shadowing calcification in the
midpole. Normal echotexture

Bladder:

Bladder wall appears mildly thickened measuring 6 mm anteriorly.

Other:

None.
IMPRESSION: Mild left hydronephrosis/pelviectasis, likely increased since prior
CT.

Mild bladder wall thickening.

## 2021-12-16 IMAGING — DX DG CHEST 1V PORT
1 series · 1 of 1 positions shown · non-contrast
Comparison: Yesterday

CLINICAL DATA: Pleural effusion on the left

EXAM:
PORTABLE CHEST 1 VIEW

[chest ap]
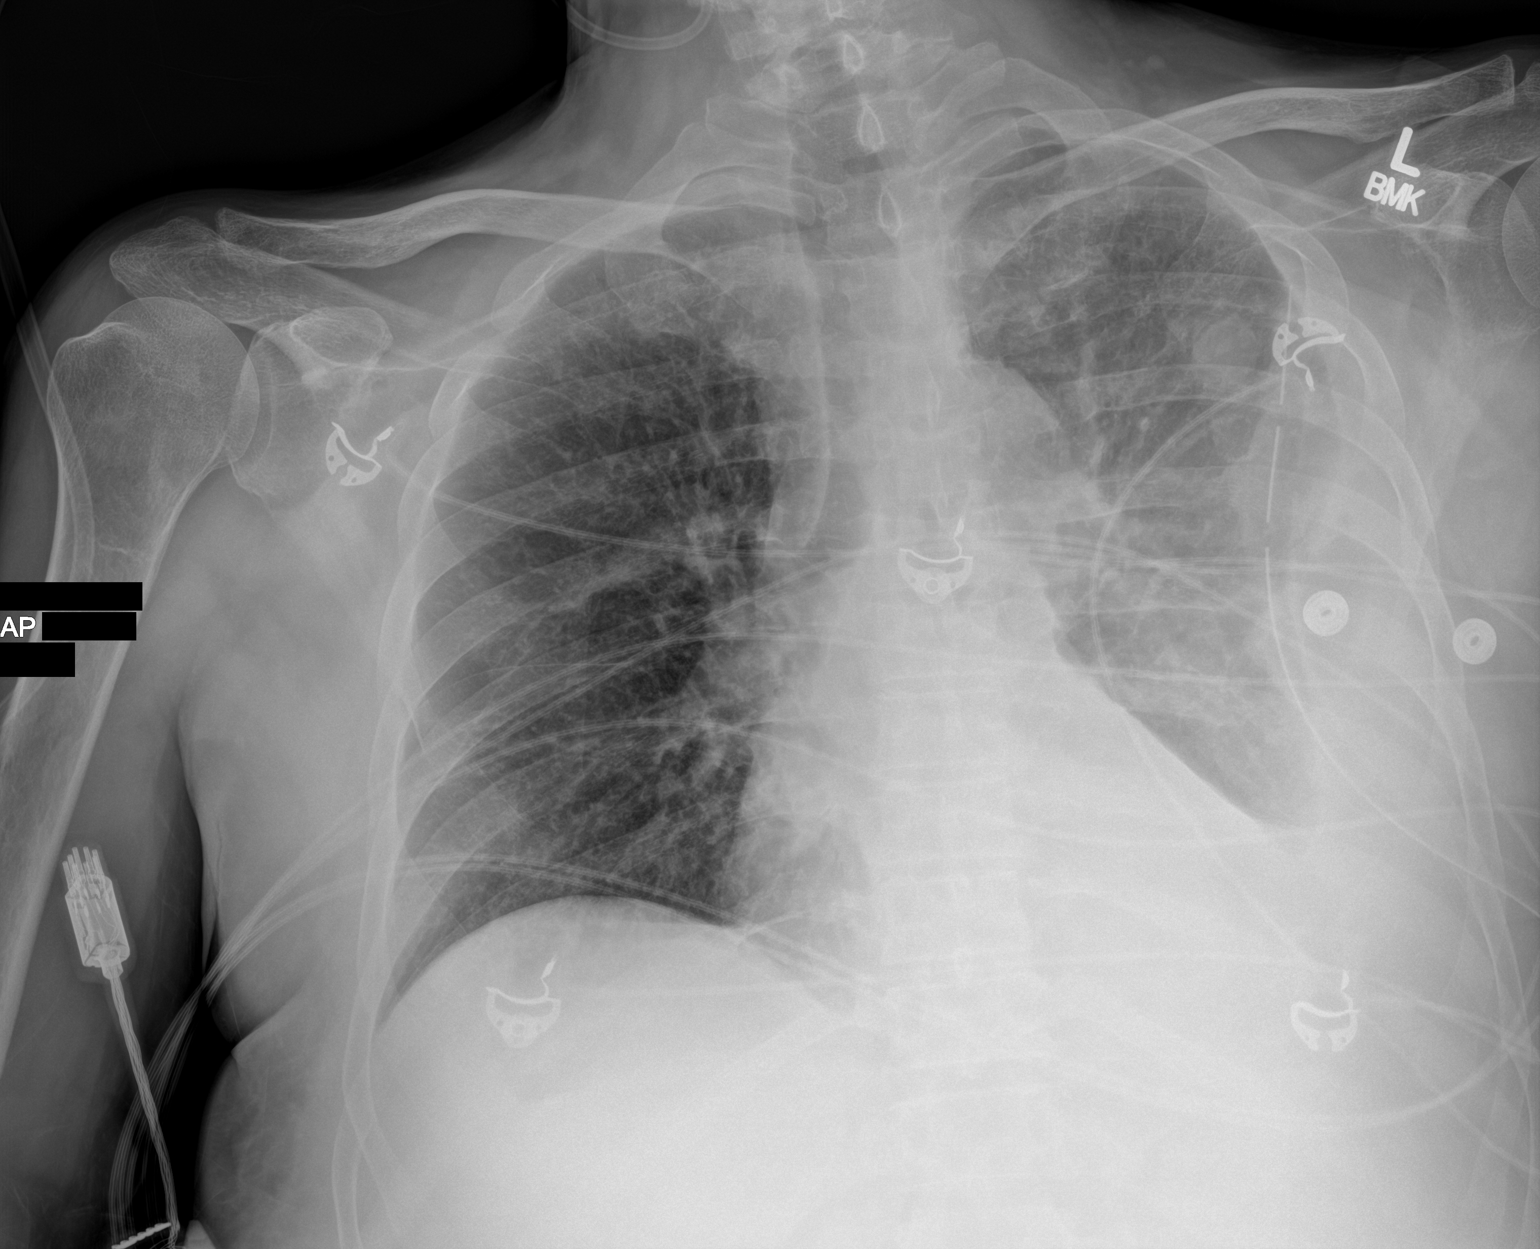

[1 of 1 positions shown; findings below may reference images not displayed]

FINDINGS: Unchanged positioning of left pleural catheter with unchanged
sizable left pleural effusion. The underlying lung is
opacified/atelectatic by CT. Nodular opacity in the left upper lobe
is redemonstrated. The right chest is essentially clear. Normal
heart size.
IMPRESSION: Unchanged sizable and loculated appearing left pleural effusion
despite stably positioned pleural catheter.

## 2021-12-17 IMAGING — CT CT CHEST W/O CM
2 of 3 series · 15 of 36 positions shown, 18 images · non-contrast
Comparison: Chest CT dated 05/22/2019.

CLINICAL DATA: Empyema. History of septic emboli with cavitary
lesions. Patient is scheduled for VATS procedure 05/26/2019.

EXAM:
CT CHEST WITHOUT CONTRAST
TECHNIQUE: Multidetector CT imaging of the chest was performed following the
standard protocol without IV contrast.

[Series 3: chest w/o 2mm st · axial · non-contrast · 0.74mm/px · z∈[+1384,+1620]mm · 12 of 140 slices shown, 15 images]
[im 11/140  mediastinal]
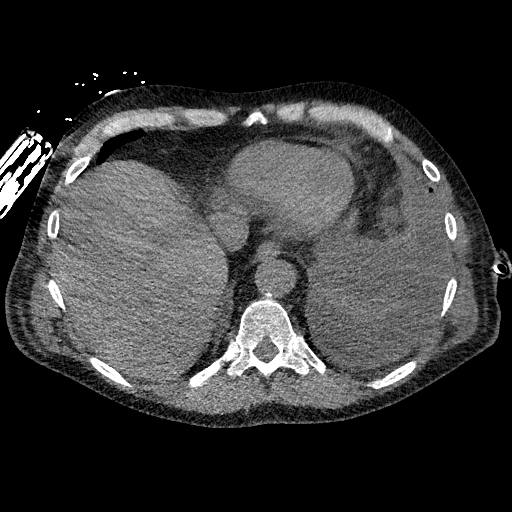
[im 11/140  lung]
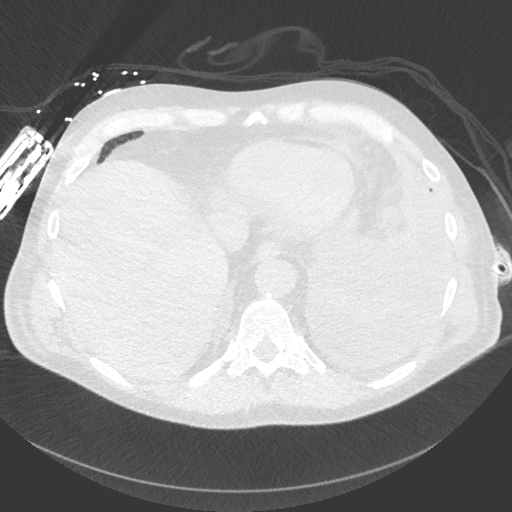
[im 21/140  lung]
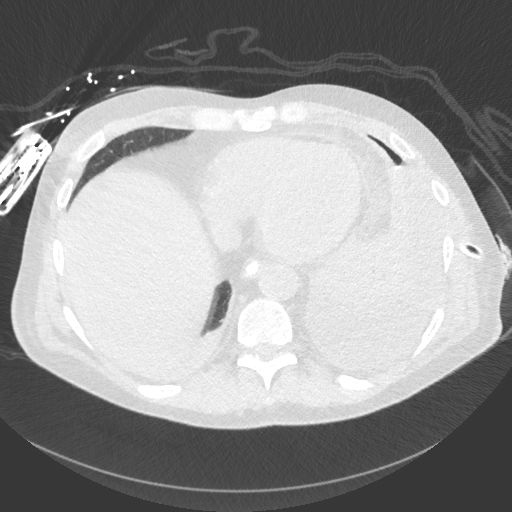
[im 31/140  lung]
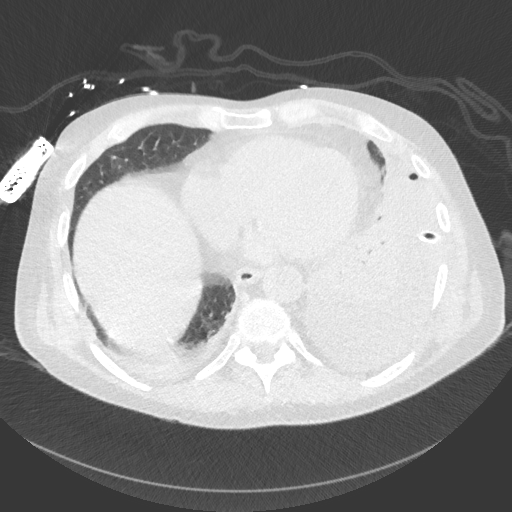
[im 42/140  lung]
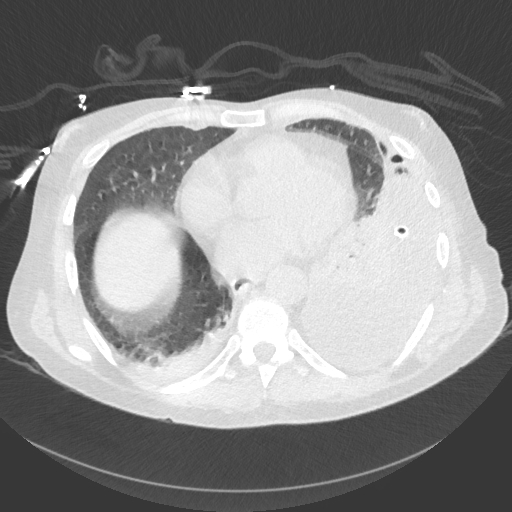
[im 52/140  mediastinal]
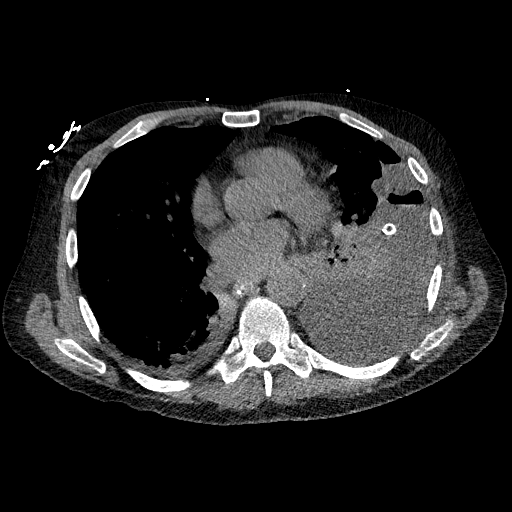
[im 52/140  lung]
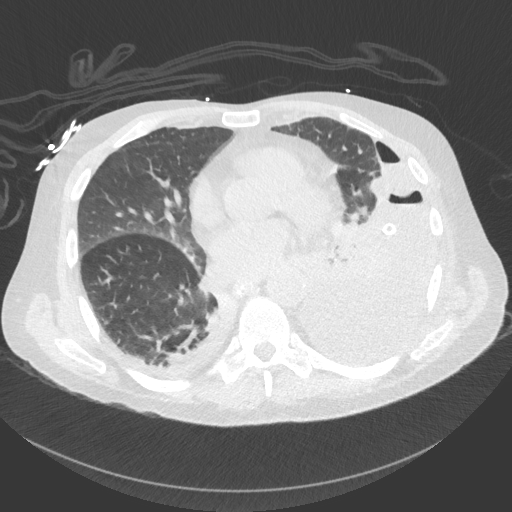
[im 62/140  lung]
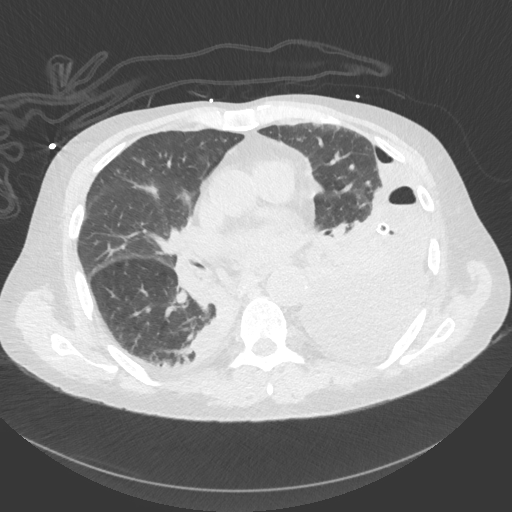
[im 78/140  lung]
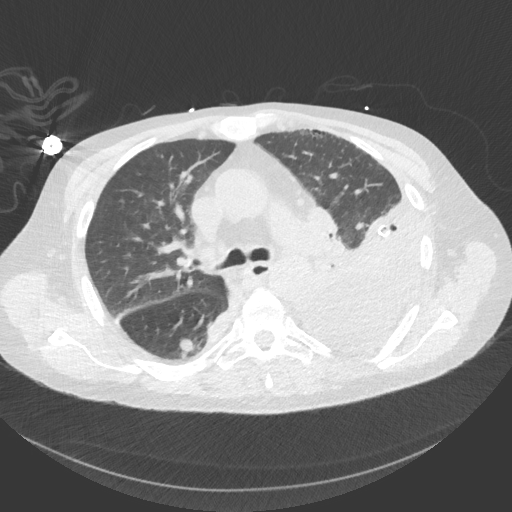
[im 88/140  lung]
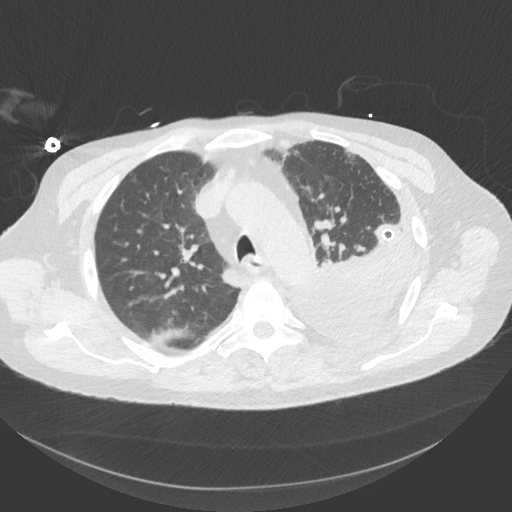
[im 98/140  mediastinal]
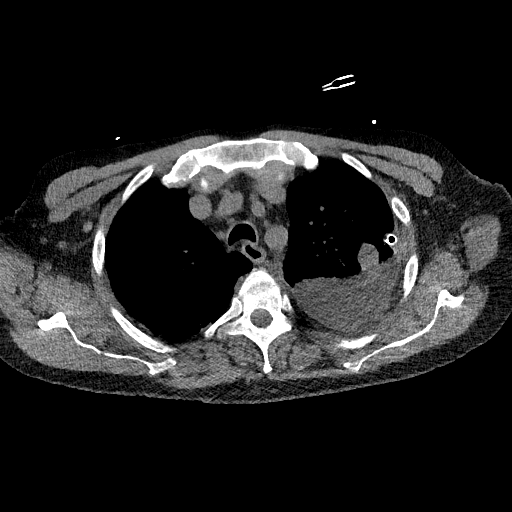
[im 98/140  lung]
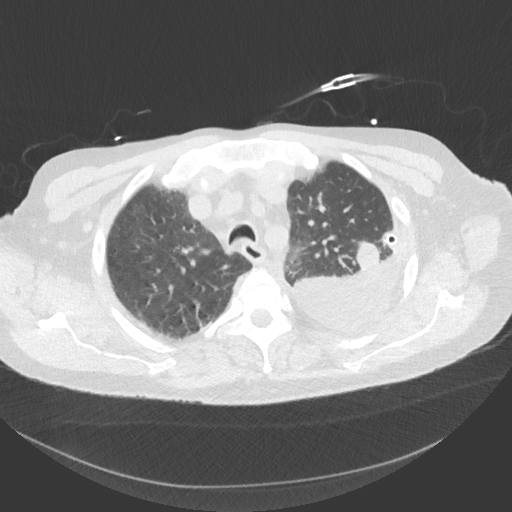
[im 109/140  lung]
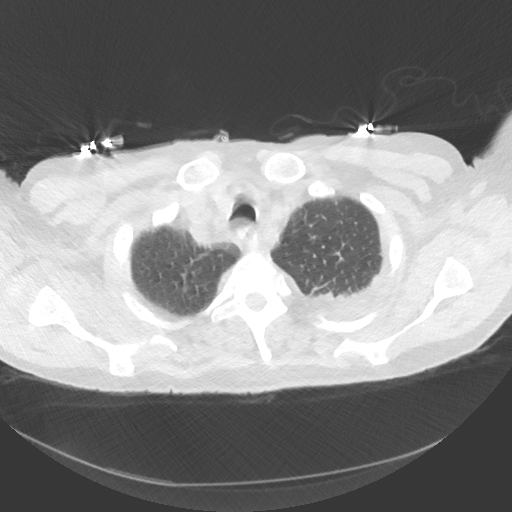
[im 119/140  lung]
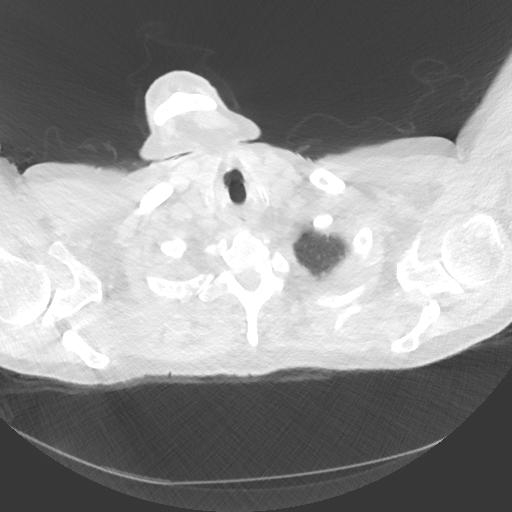
[im 129/140  lung]
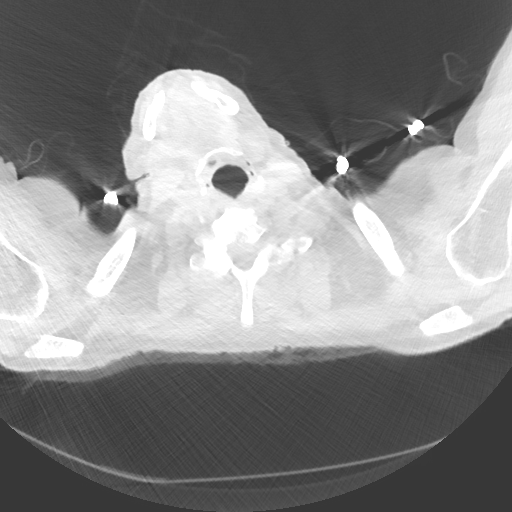

[Series 6: chest w/o 3mm st cor · coronal · non-contrast · 0.51mm/px · 3 of 101 slices shown]
[im 21/101  lung]
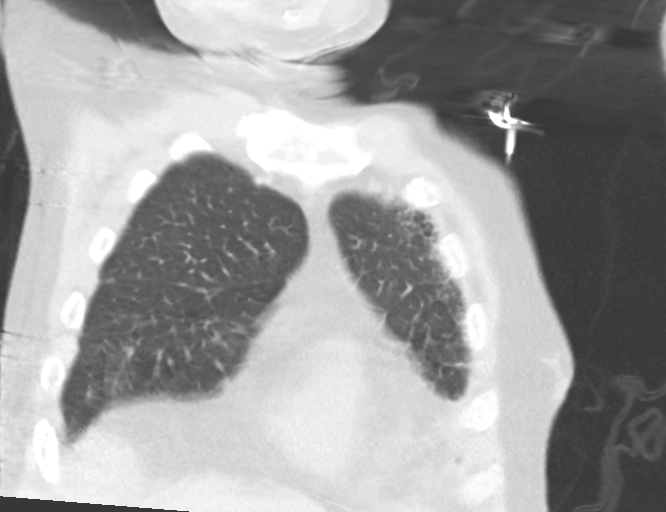
[im 41/101  lung]
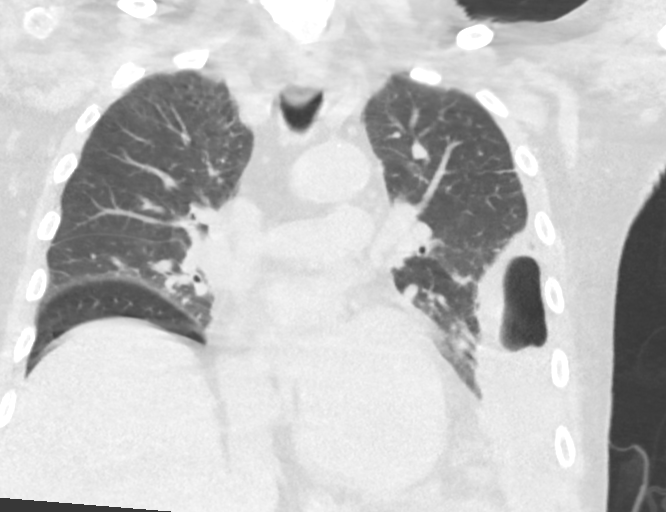
[im 61/101  lung]
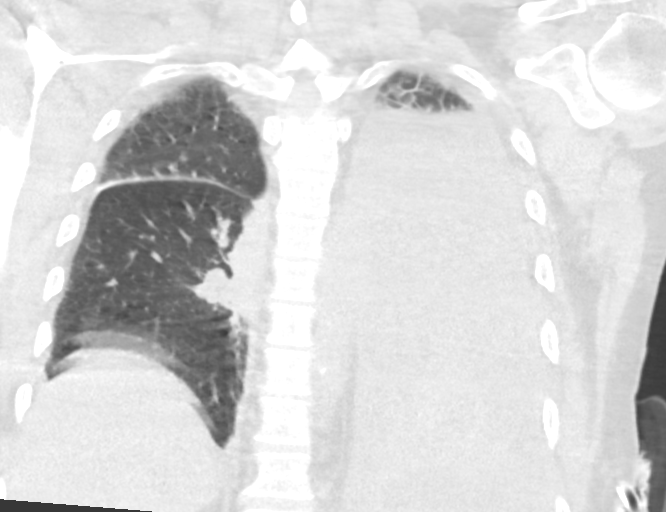

[15 of 36 positions shown; findings below may reference images not displayed]

FINDINGS: Cardiovascular: Heart size appears stable. No thoracic aortic
aneurysm. Mild aortic atherosclerosis.

Mediastinum/Nodes: Oral contrast within the lower esophagus.
Esophagus otherwise unremarkable in appearance. No mass or enlarged
lymph nodes appreciated within the mediastinum. Trachea is
unremarkable.

Lungs/Pleura: LEFT-sided chest tube has been placed in the interval,
located within a persistent large pleural effusion on the LEFT which
is likely loculated to some degree. There is associated compressive
atelectasis w along the course of the LEFT pleural effusion. No
RIGHT-sided pleural effusion. Additional atelectasis at the RIGHT
lung base.

Again noted are scattered pulmonary nodules including a 2 cm LEFT
upper lobe nodule (series 4, image 44) and a 1 cm LEFT apical nodule
(image 27), posterior RIGHT lower lobe nodules measuring 0.7 cm and
1.3 cm (images 36 and 46 respectively, and a RIGHT lower lobe nodule
measuring 1.1 cm (image 62).

All of these nodules are similar in size. No new nodules or
consolidations identified within either lung. No pneumothorax.

Upper Abdomen: Limited images of the upper abdomen are unremarkable.

Musculoskeletal: No acute or suspicious osseous finding.
IMPRESSION: 1. LEFT-sided chest tube has been placed in the interval, located
within a persistent large LEFT pleural effusion which is likely
loculated to some degree. Associated compressive atelectasis along
the course of the LEFT pleural effusion.
2. Additional atelectasis at the RIGHT lung base. No RIGHT-sided
pleural effusion.
3. Stable bilateral pulmonary nodules, as detailed above, compatible
with the previous description of septic emboli. No new nodules or
consolidations identified within either lung.

Aortic Atherosclerosis ([id number]-WXL.L).

## 2021-12-17 IMAGING — DX DG CHEST 1V PORT
1 series · 1 of 1 positions shown · non-contrast
Comparison: CT 05/25/2019, radiograph 05/24/2019

CLINICAL DATA: Postop chest x-ray in PACU

EXAM:
PORTABLE CHEST 1 VIEW

[chest ap]
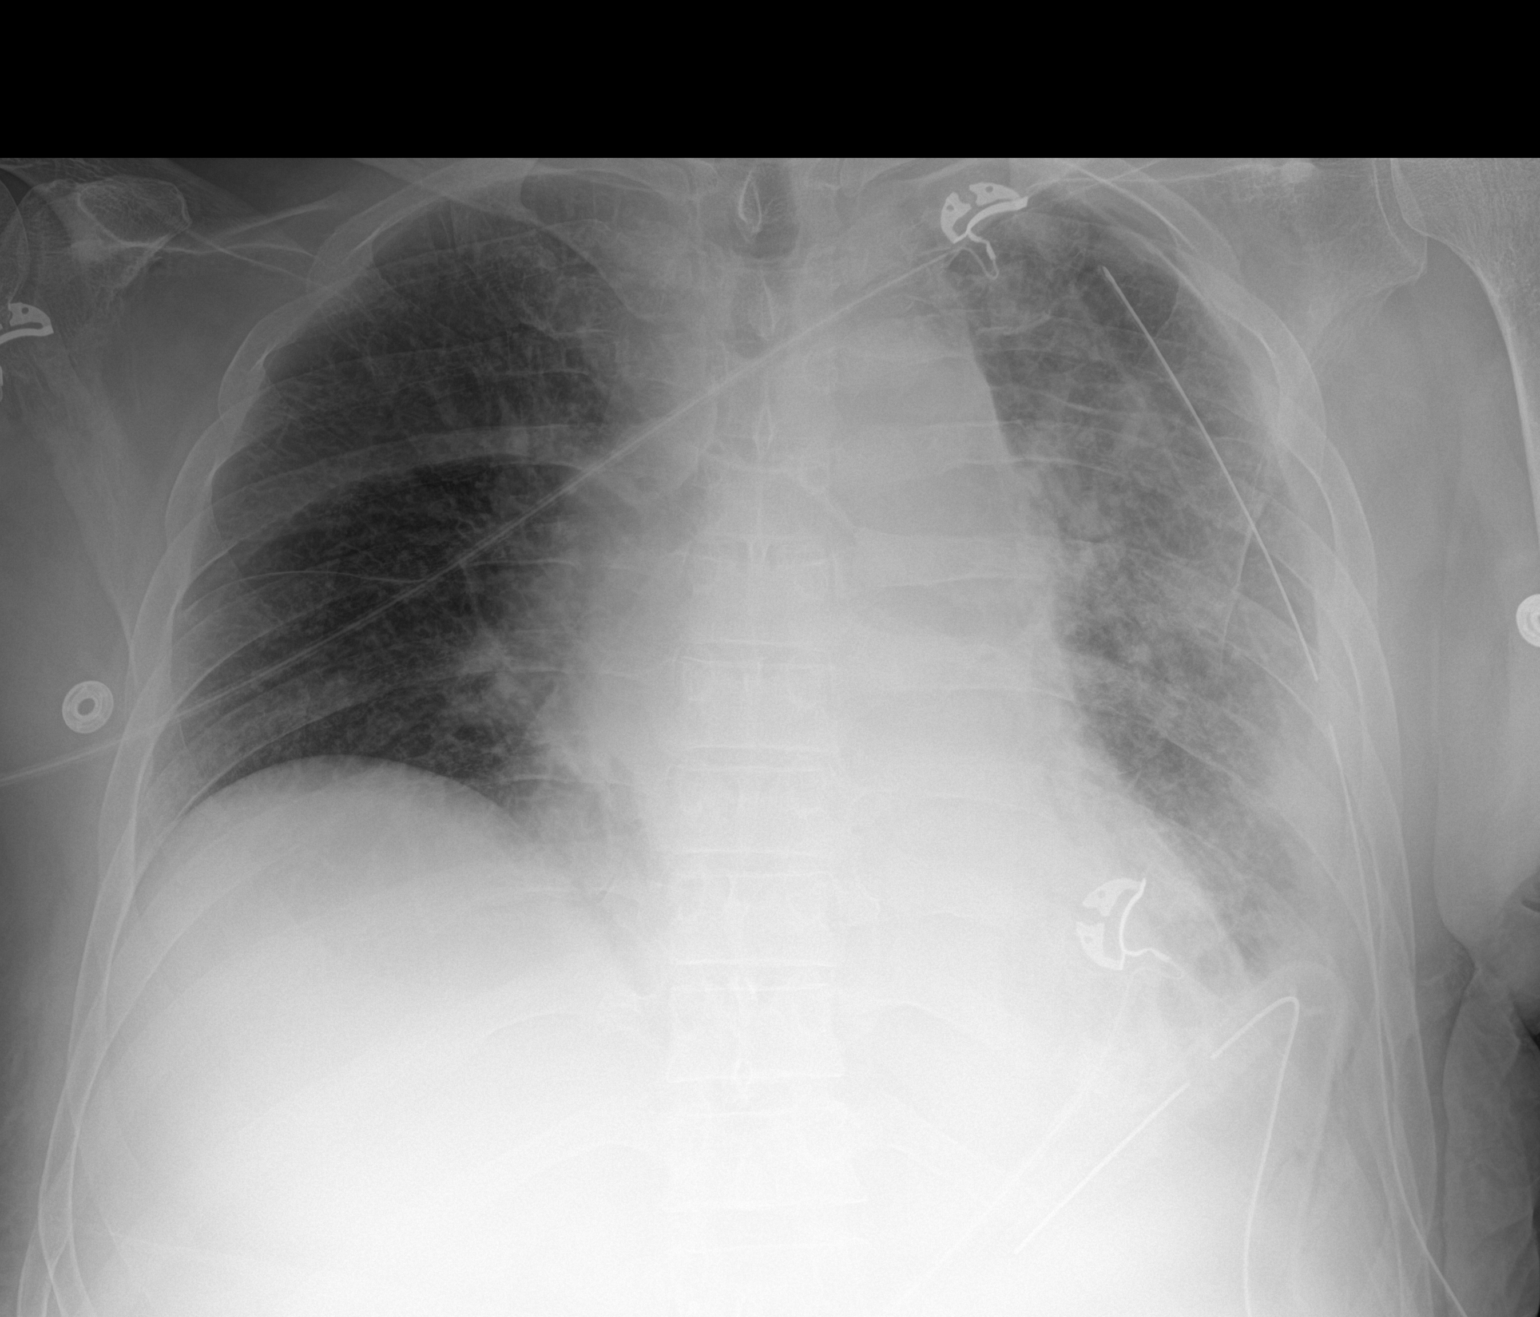

[1 of 1 positions shown; findings below may reference images not displayed]

FINDINGS: There is diffusely increased opacity through the left hemithorax
with diminished size of the complex left pleural effusion within
apical left chest tube and basilar left chest tube in place lung
volumes are diminished in the right hemithorax with increased
streaky opacity in vascular crowding favoring atelectatic change.
Multifocal nodular opacities are again noted in the left lung.
Lesions in the right lung are difficult to visualize.
Cardiomediastinal contours are partially obscured by overlying
opacity but appear grossly similar accounting for differences in
technique and lung inflation. No acute osseous or soft tissue
abnormality.
IMPRESSION: 1. Persistent asymmetric opacity through the left hemithorax
reflecting distributed pleural fluid but with likely overall
decrease in size of the complex left pleural effusion. Apical and
basal left chest tubes in place.
2. Multifocal nodular opacities in the left lung are grossly similar
in appearance to prior studies.
3. Multifocal nodular opacities in the right lung are difficult to
visualize.

## 2021-12-18 IMAGING — DX DG CHEST 1V PORT
1 series · 1 of 1 positions shown · non-contrast
Comparison: 05/25/2019

CLINICAL DATA: Empyema.  Chest tube.

EXAM:
PORTABLE CHEST 1 VIEW

[chest]
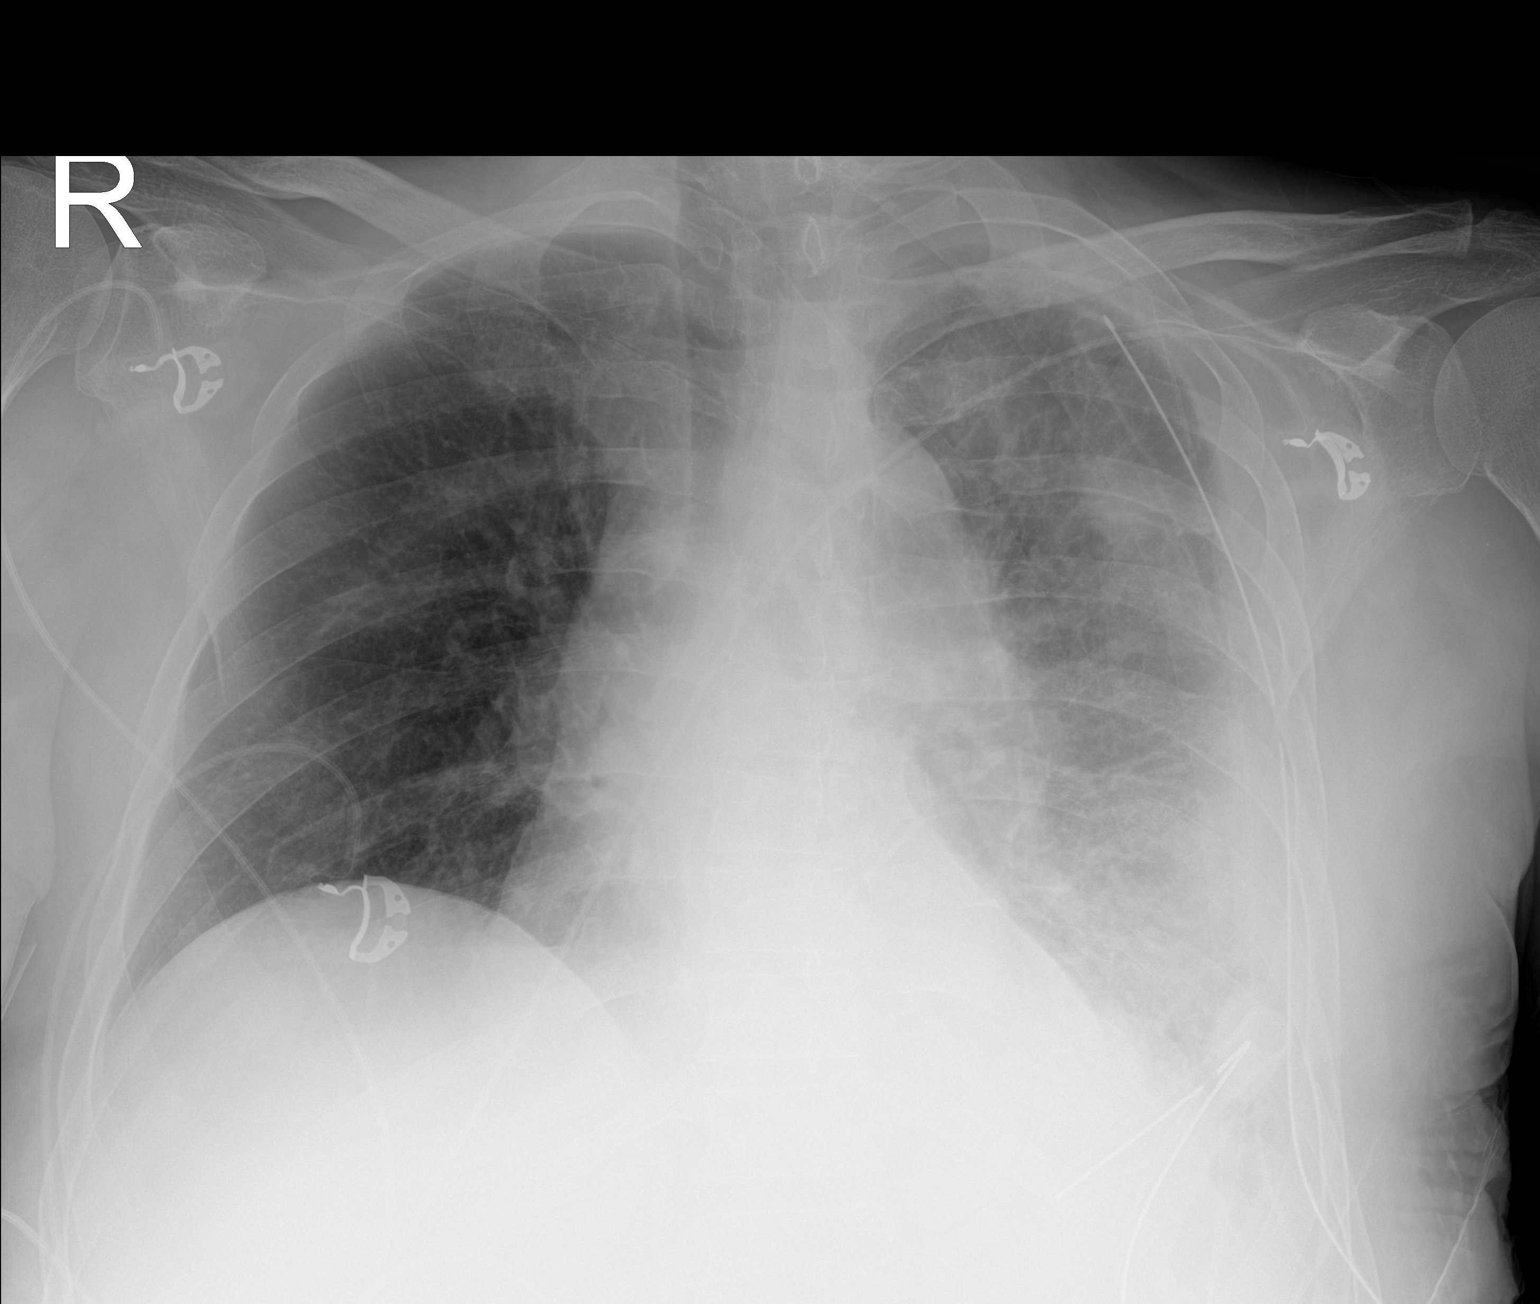

[1 of 1 positions shown; findings below may reference images not displayed]

FINDINGS: Left apical as well as left basilar chest tubes unchanged. Lungs are
adequately inflated with persistent hazy opacification over the left
lung without significant change. Small to moderate left effusion
unchanged. No definite pneumothorax. Right lung is clear.
Cardiomediastinal silhouette and remainder of the exam is unchanged.
IMPRESSION: Stable opacification over the left lung. Slight improvement
left-sided effusion with basilar atelectasis.

Two left-sided chest tubes unchanged.

## 2021-12-20 IMAGING — DX DG CHEST 1V PORT
1 series · 1 of 1 positions shown · non-contrast
Comparison: 05/27/2019

CLINICAL DATA: Empyema.

EXAM:
PORTABLE CHEST 1 VIEW

[chest]
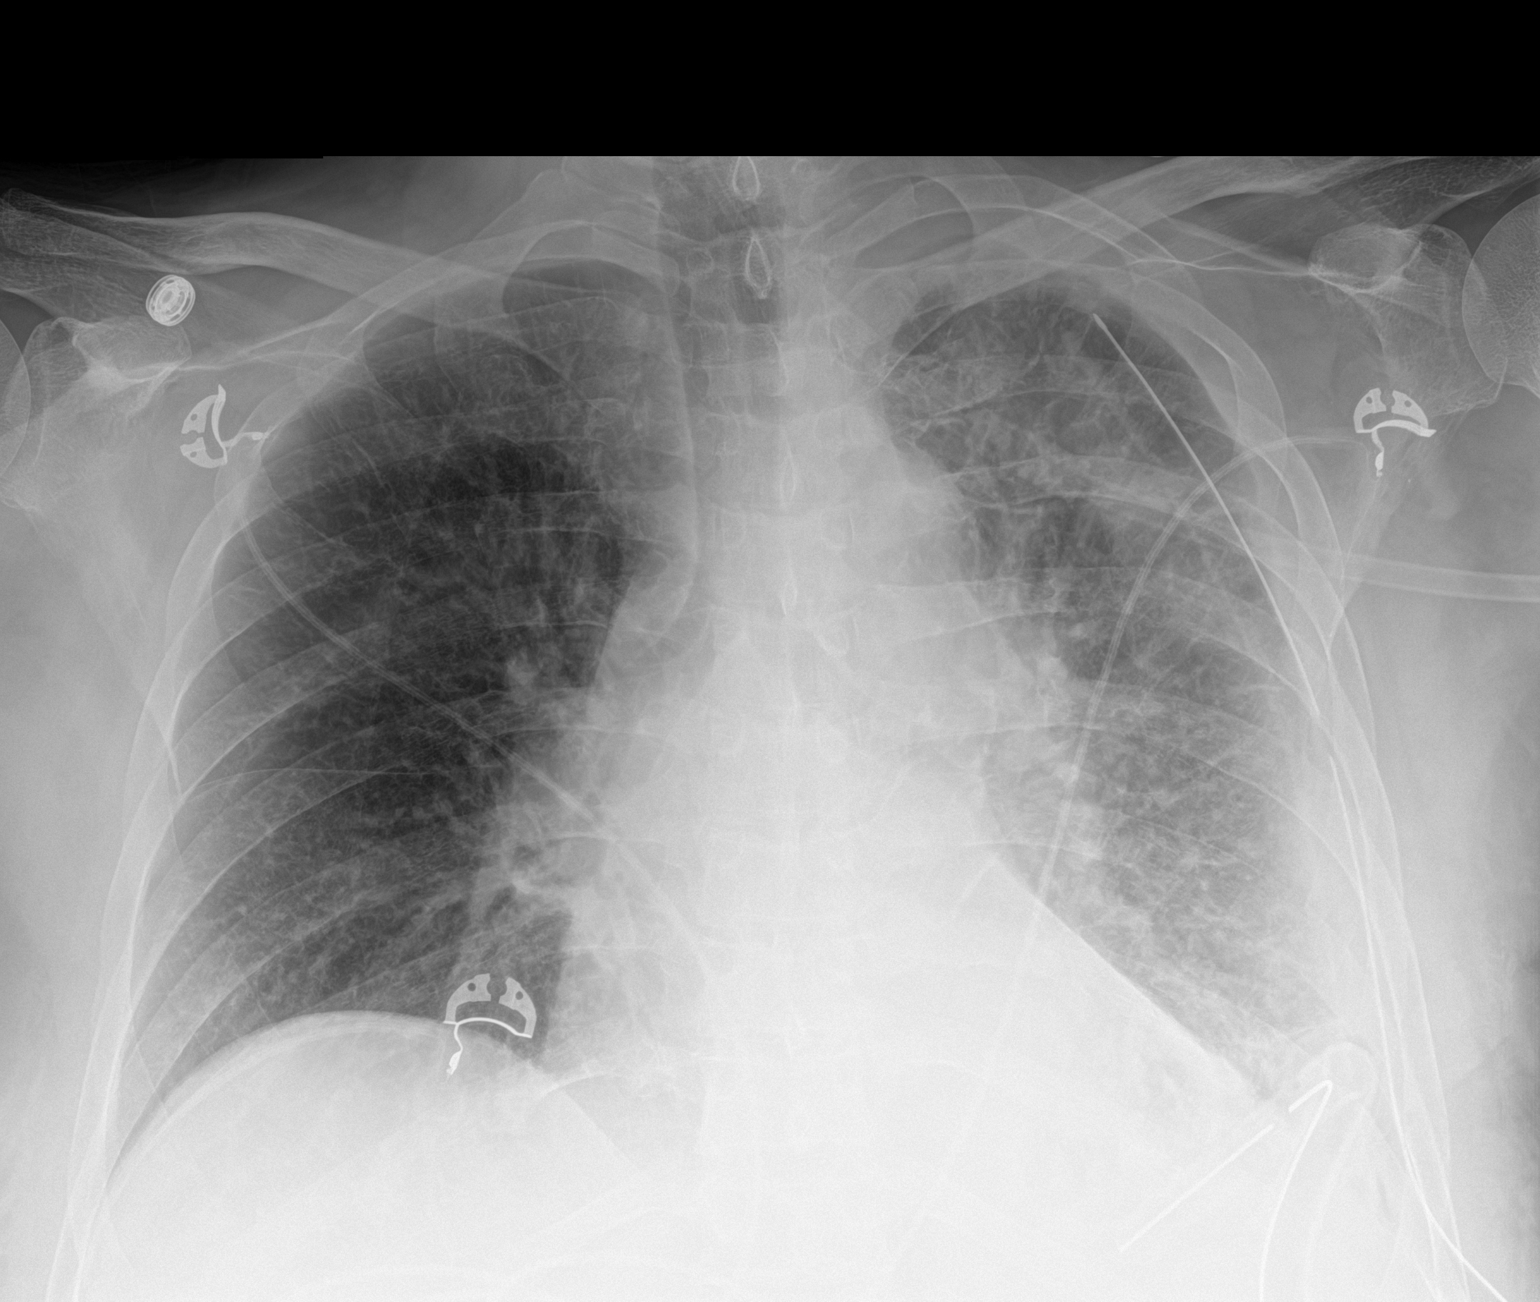

[1 of 1 positions shown; findings below may reference images not displayed]

FINDINGS: Left chest tubes remain in place. Similar peripheral opacity left
hemithorax compatible with pleural fluid and/or thickening.
Retrocardiac collapse/consolidation is similar. The cardio
pericardial silhouette is enlarged. Interstitial markings are
diffusely coarsened with chronic features. Telemetry leads overlie
the chest.
IMPRESSION: Stable exam.  No new or acute interval findings.

## 2021-12-21 IMAGING — CR DG CHEST 2V
2 series · 2 of 2 positions shown · non-contrast
Comparison: 05/28/2019

CLINICAL DATA: Empyema. Status post left lower lobe wedge resection
and drainage.

EXAM:
CHEST - 2 VIEW

[chest lat]
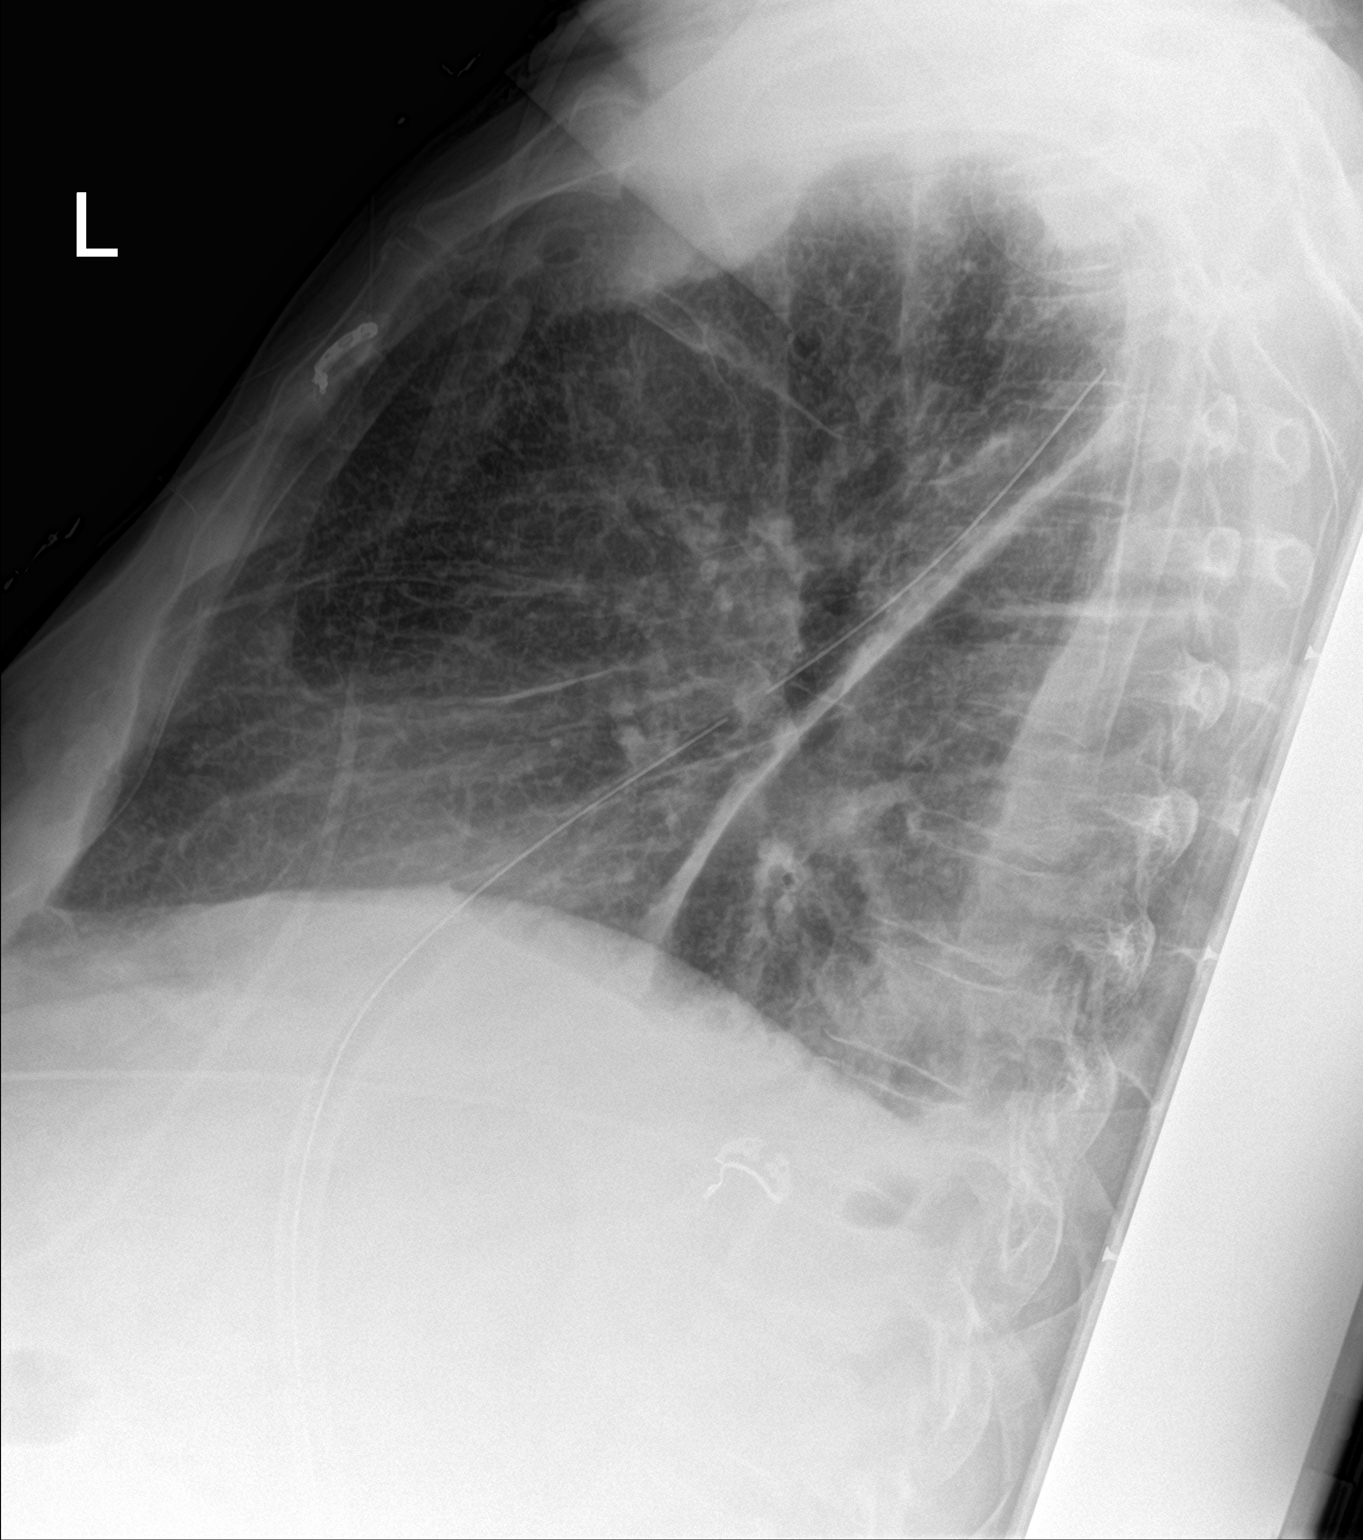

[chest ap]
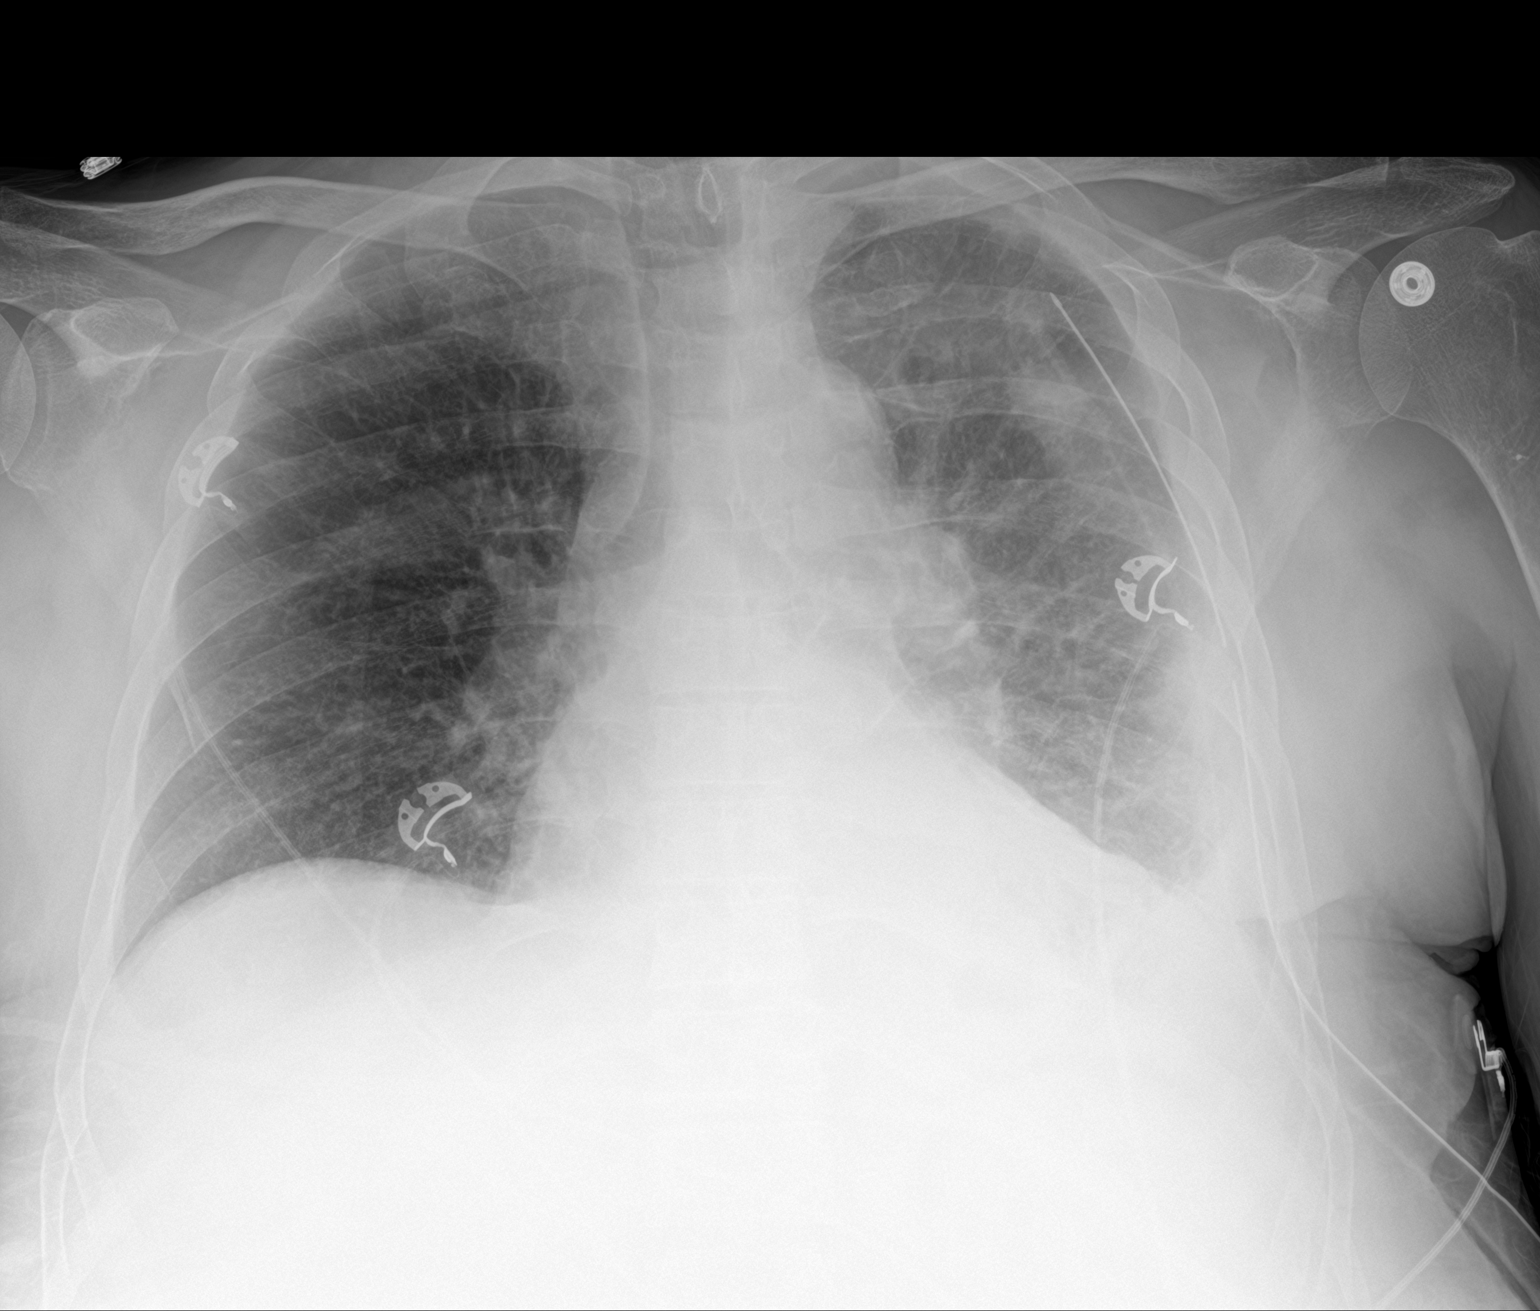

[2 of 2 positions shown; findings below may reference images not displayed]

FINDINGS: Benign 41 hours. Low volume film. The cardio pericardial silhouette
is enlarged. 1 of 2 left chest tubes has been removed in the
interval. Persistent left pleural thickening, stable. No evidence
for associated left pneumothorax. Retrocardiac
collapse/consolidative opacity is stable. Right lung clear.
Telemetry leads overlie the chest.
IMPRESSION: 1. Interval removal of 1 of the left-sided chest tubes without
evidence for residual pneumothorax.
2. Persistent left pleural thickening with retrocardiac left base
collapse/consolidation.

## 2021-12-22 IMAGING — DX DG CHEST 1V PORT
1 series · 1 of 1 positions shown · non-contrast
Comparison: May 29, 2019.

CLINICAL DATA: Chest tube.

EXAM:
PORTABLE CHEST 1 VIEW

[chest ap]
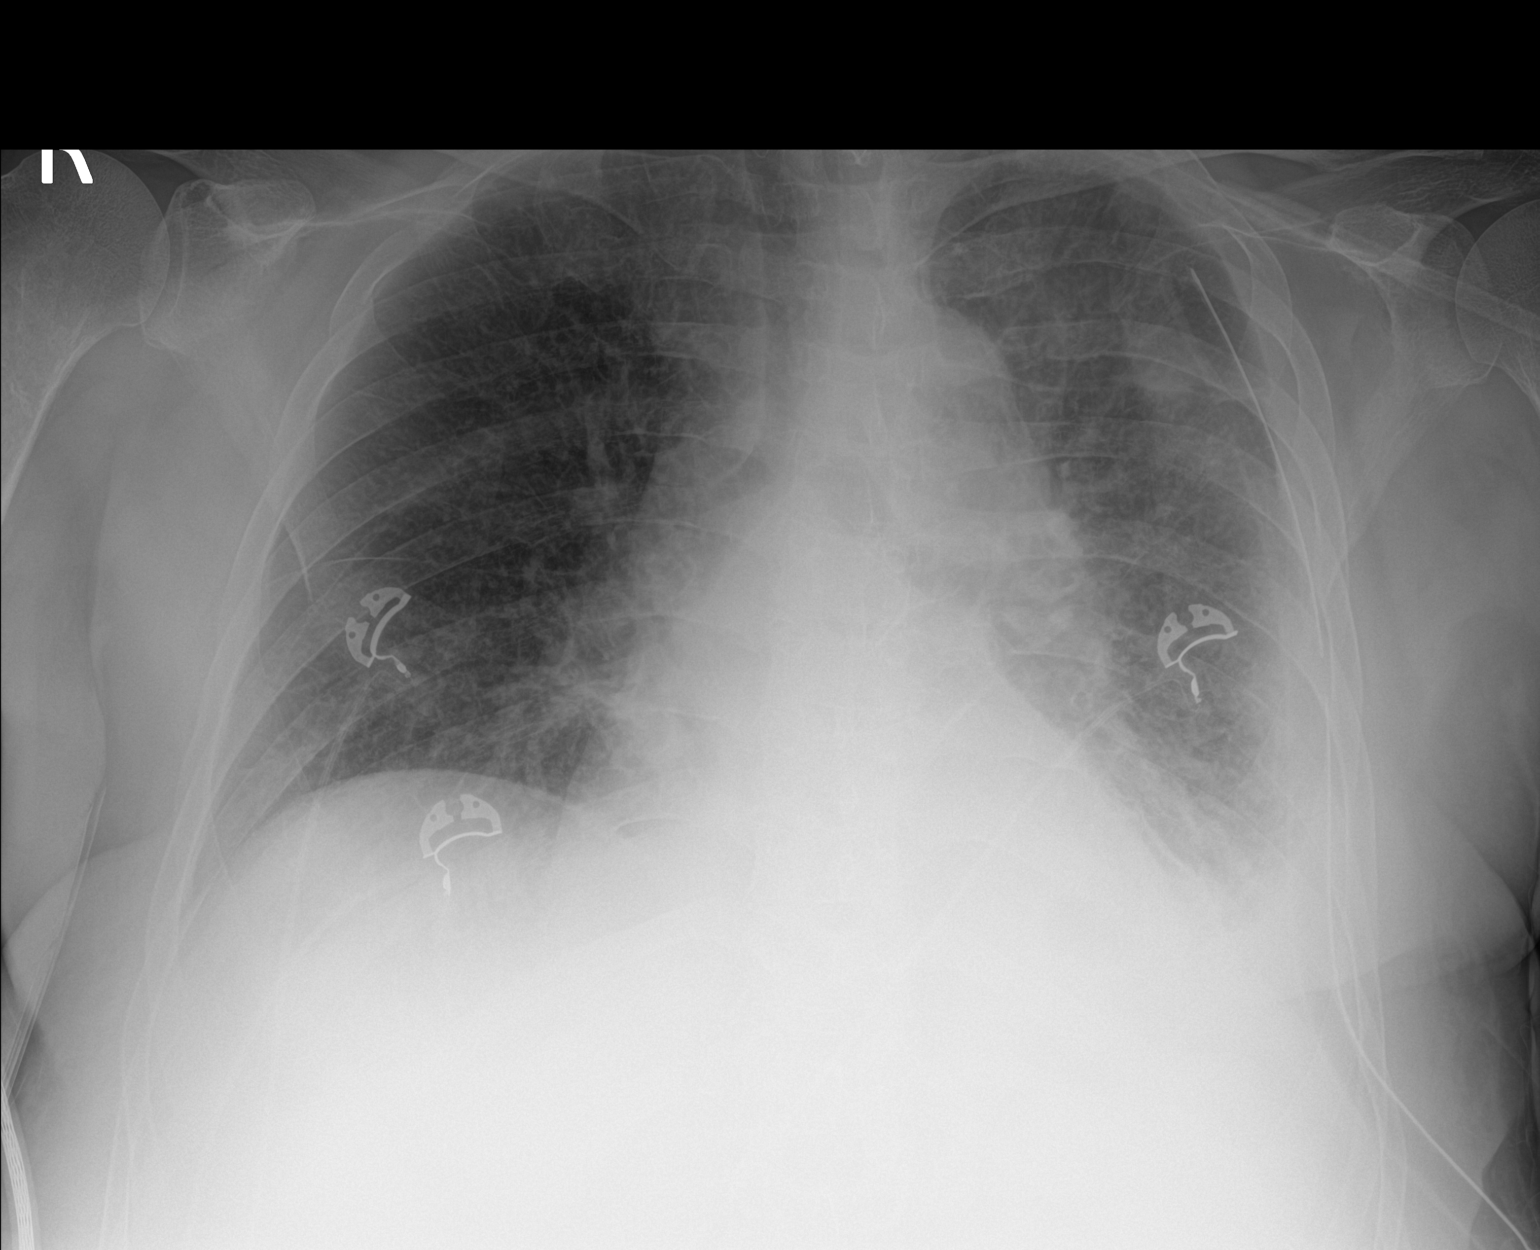

[1 of 1 positions shown; findings below may reference images not displayed]

FINDINGS: Stable cardiomediastinal silhouette. Left-sided chest tube is noted
and unchanged in position. No definite pneumothorax is noted. Right
lung is clear. Stable left basilar atelectasis and possible small
pleural effusion is noted. Bony thorax is unremarkable.
IMPRESSION: Left-sided chest tube is unchanged in position. No definite
pneumothorax is noted. Stable left basilar atelectasis and possible
small pleural effusion.

## 2021-12-23 IMAGING — DX DG CHEST 1V PORT
1 series · 1 of 1 positions shown · non-contrast
Comparison: Yesterday

CLINICAL DATA: Chest tube

EXAM:
PORTABLE CHEST 1 VIEW

[chest]
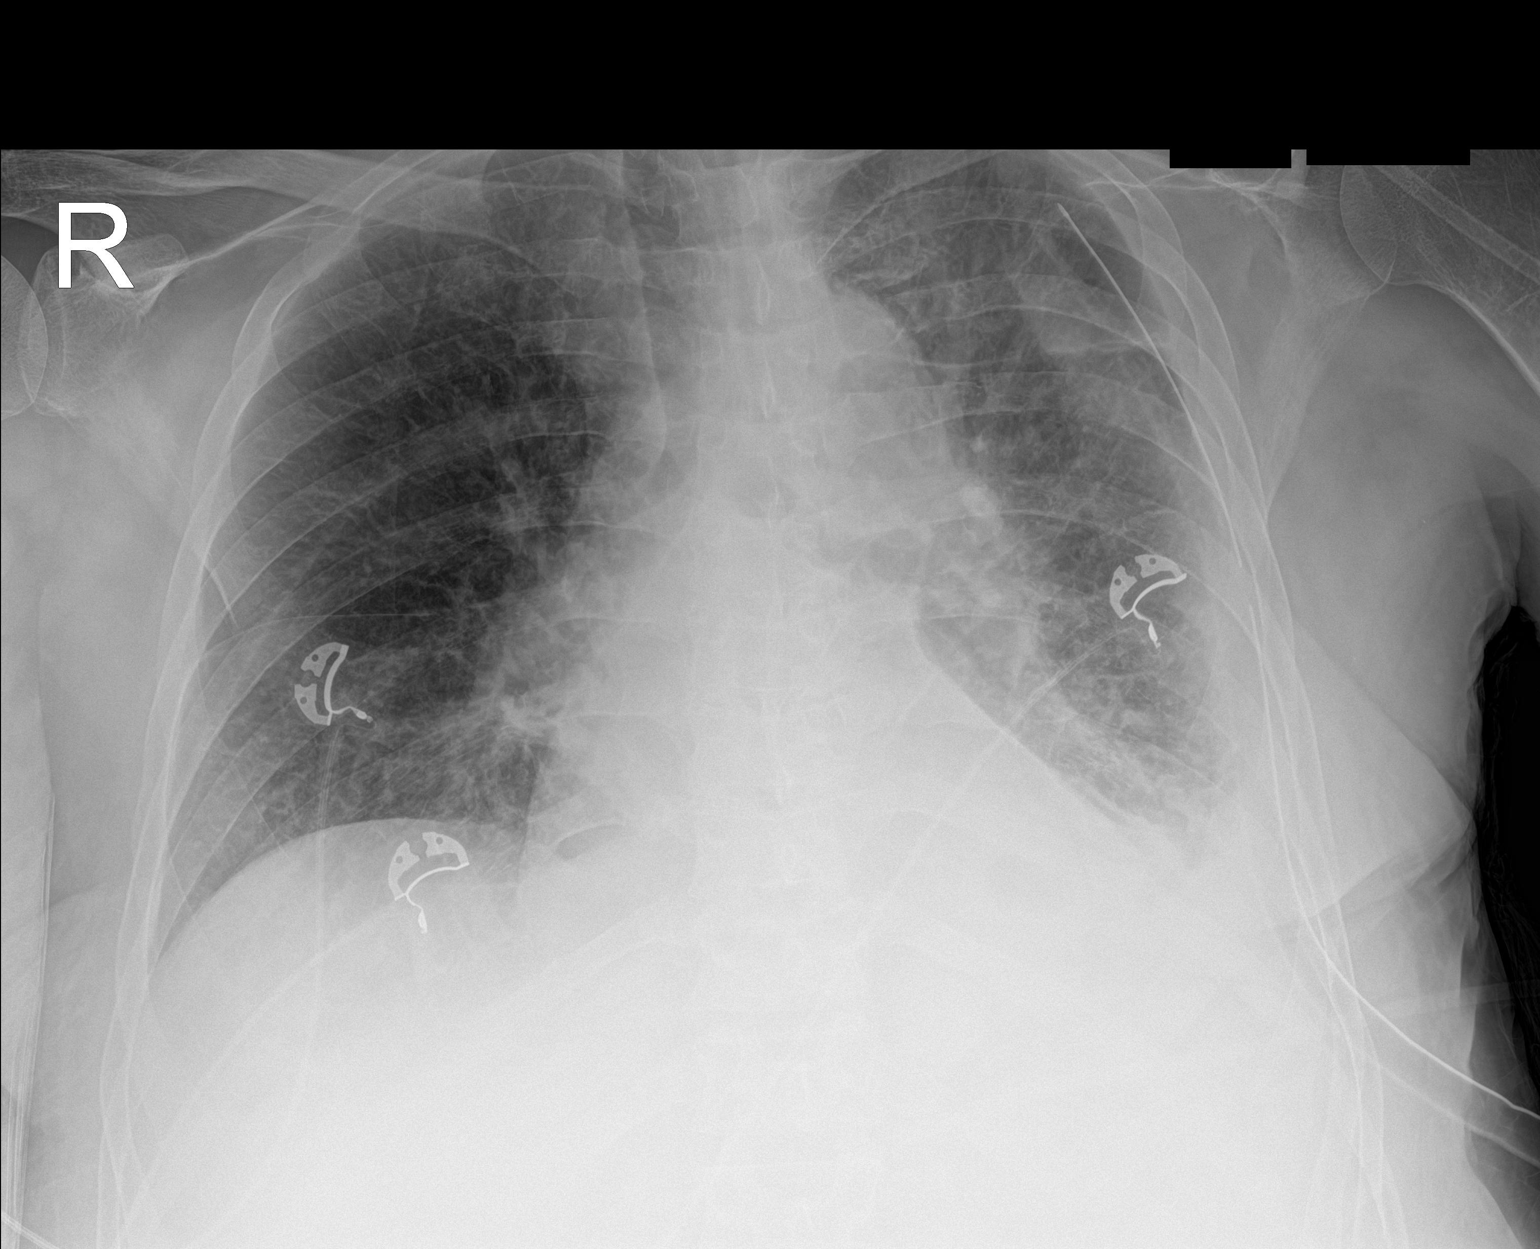

[1 of 1 positions shown; findings below may reference images not displayed]

FINDINGS: Left chest tube in stable position. No visible pneumothorax or
increasing pleural fluid/thickening. There is still volume loss and
hazy density on the left. Low volume but relatively clear right
lung. Stable generous heart size accentuated by mediastinal fat by
recent CT.
IMPRESSION: Unchanged residual pleural fluid and/or thickening on the left.
Stable left chest tube positioning.

## 2021-12-24 IMAGING — DX DG CHEST 1V PORT
1 series · 1 of 1 positions shown · non-contrast
Comparison: 05/31/2019

CLINICAL DATA: Chest tube removal, shortness of breath

EXAM:
PORTABLE CHEST 1 VIEW

[chest ap]
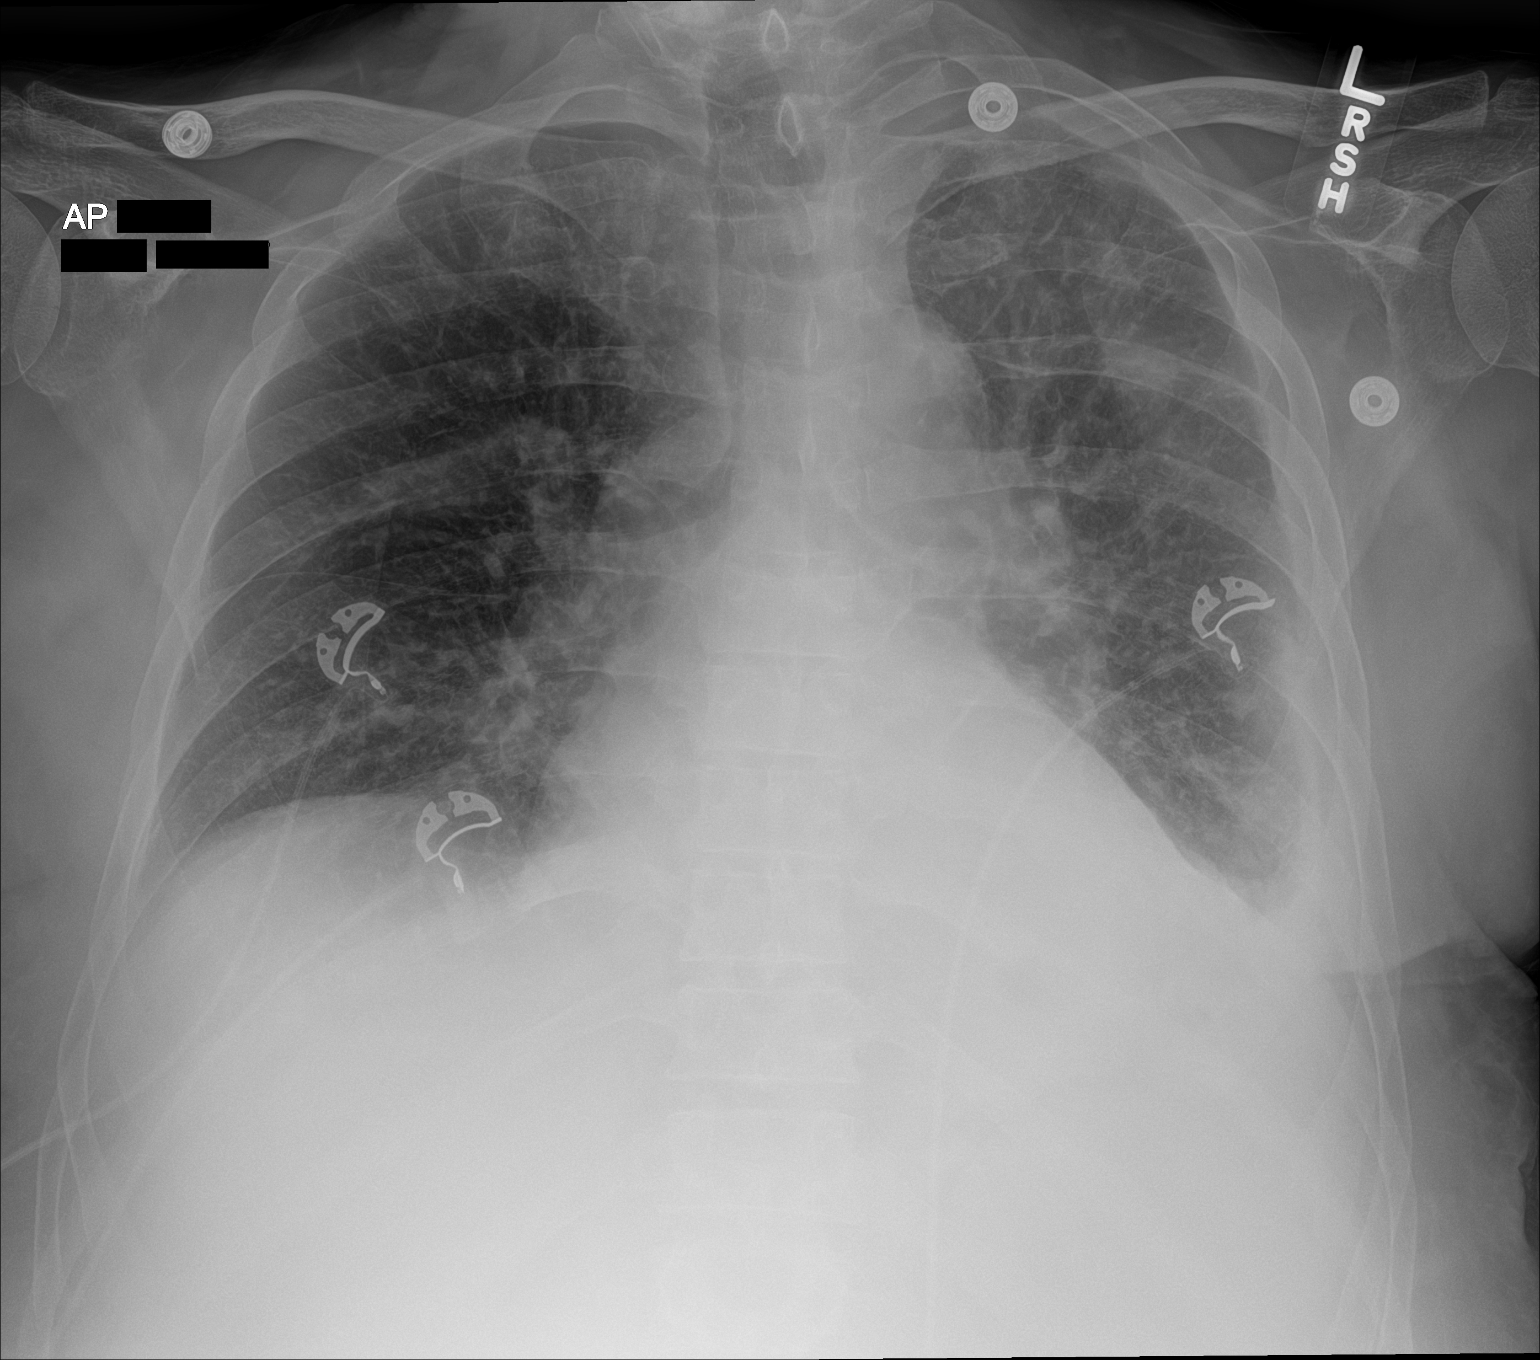

[1 of 1 positions shown; findings below may reference images not displayed]

FINDINGS: Interval removal of left chest tube. No pneumothorax. Small residual
left pleural effusion. Left base atelectasis and mild vascular
congestion. Right lung clear. No acute bony abnormality.
IMPRESSION: Interval removal of left chest tube without pneumothorax.

Small left effusion with left base atelectasis.

## 2021-12-24 IMAGING — US US RENAL
1 series · 14 of 21 positions shown · non-contrast
Comparison: Renal ultrasound 05/23/2019

CLINICAL DATA: Acute kidney injury.

EXAM:
RENAL / URINARY TRACT ULTRASOUND COMPLETE

[Series 2: us renal · 21 acquisitions, 14 frames shown]
[im 1/21]
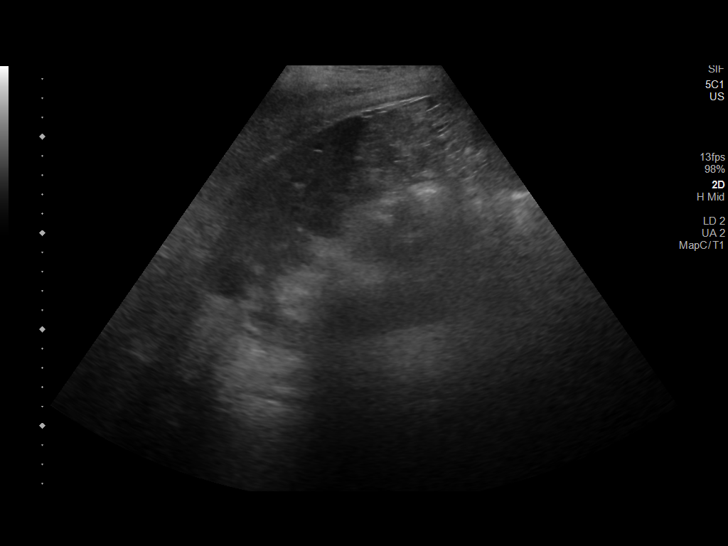
[im 3/21]
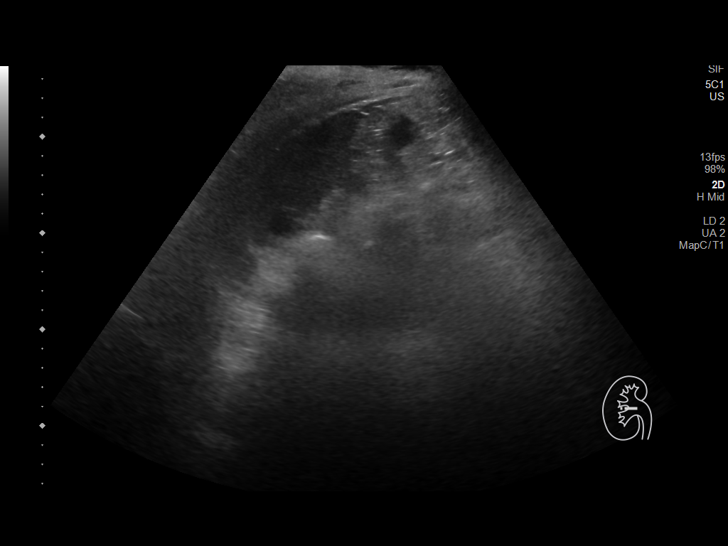
[im 4/21]
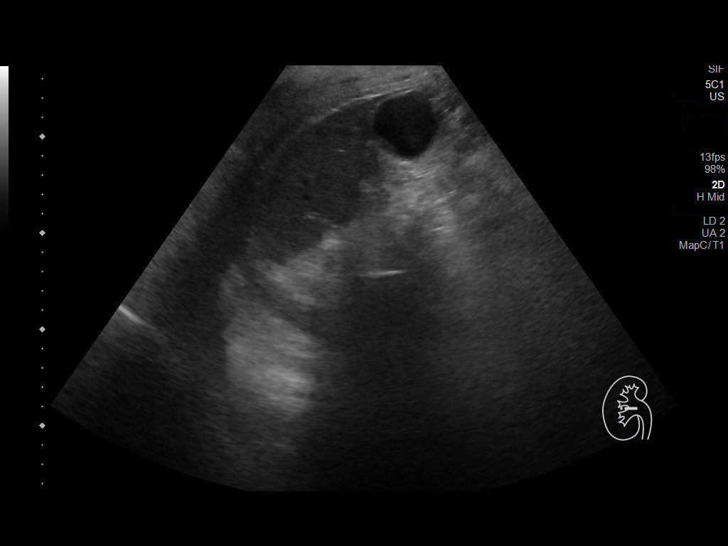
[im 6/21]
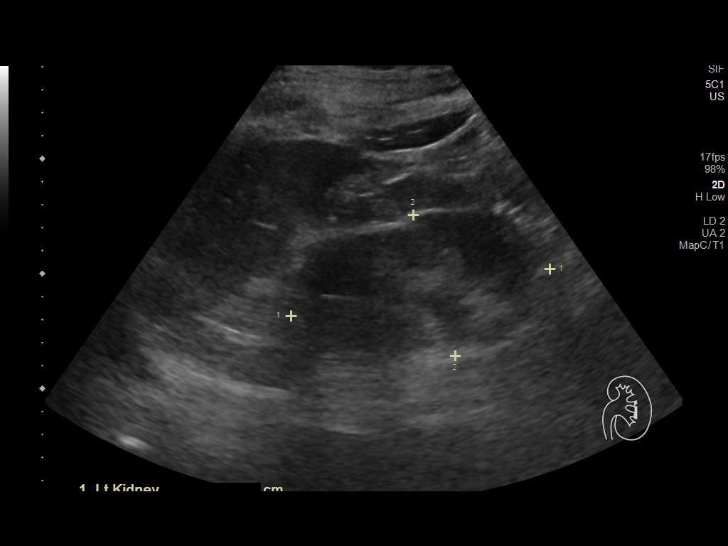
[im 7/21]
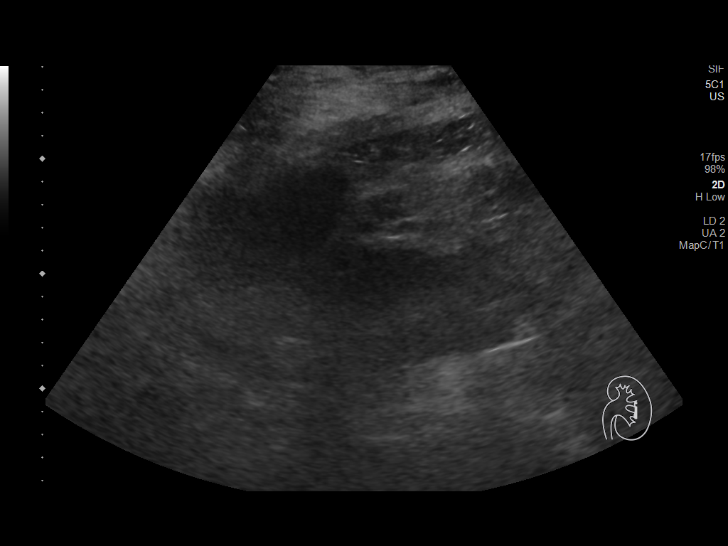
[im 9/21]
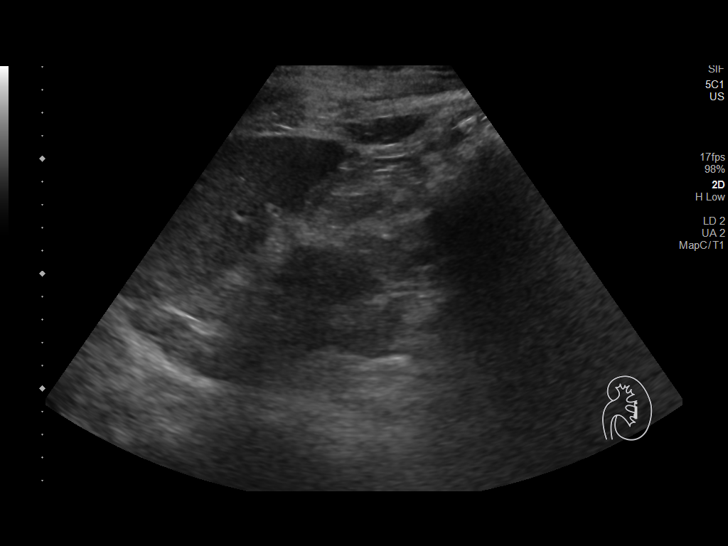
[im 10/21]
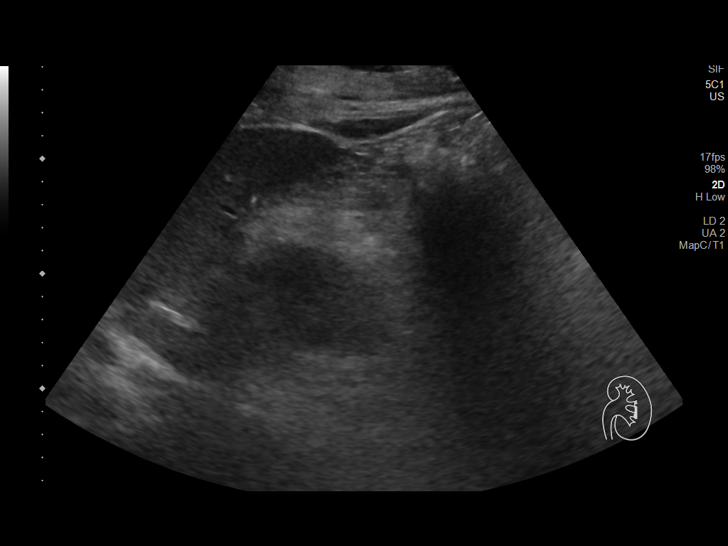
[im 12/21]
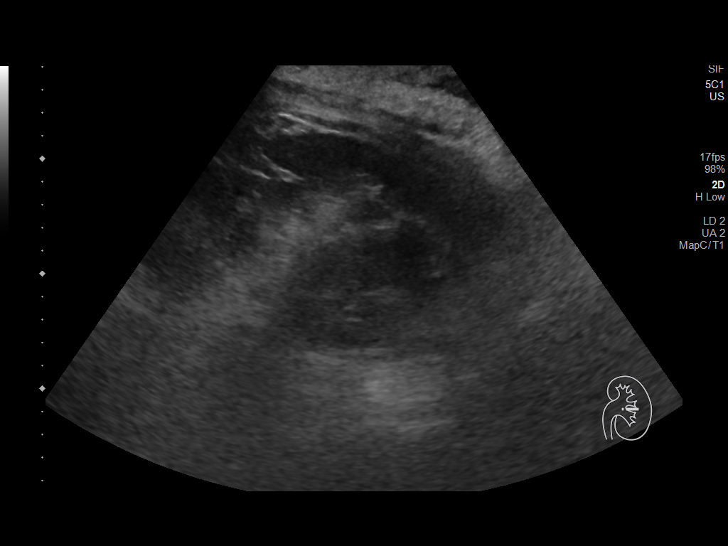
[im 13/21]
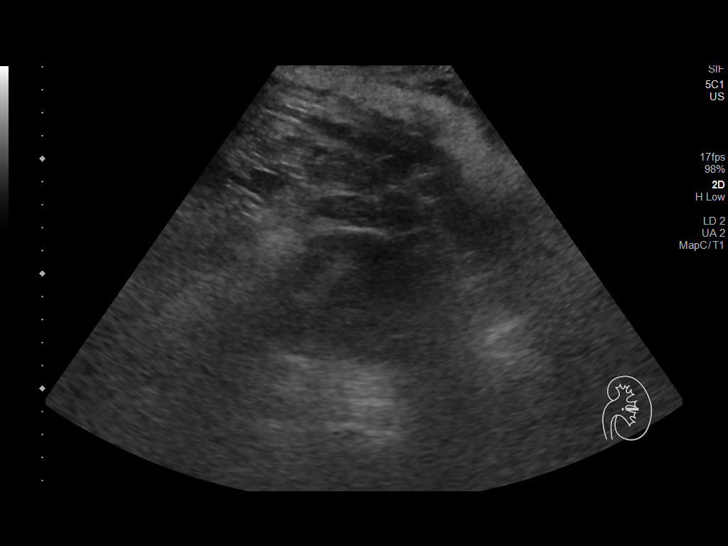
[im 15/21]
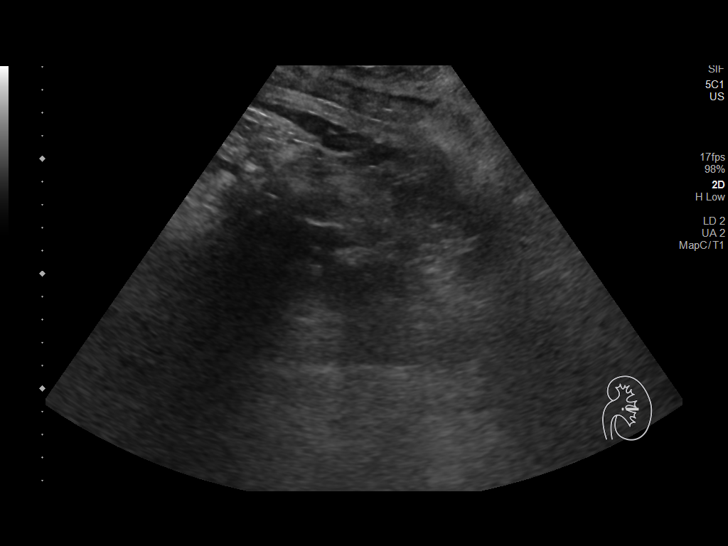
[im 16/21]
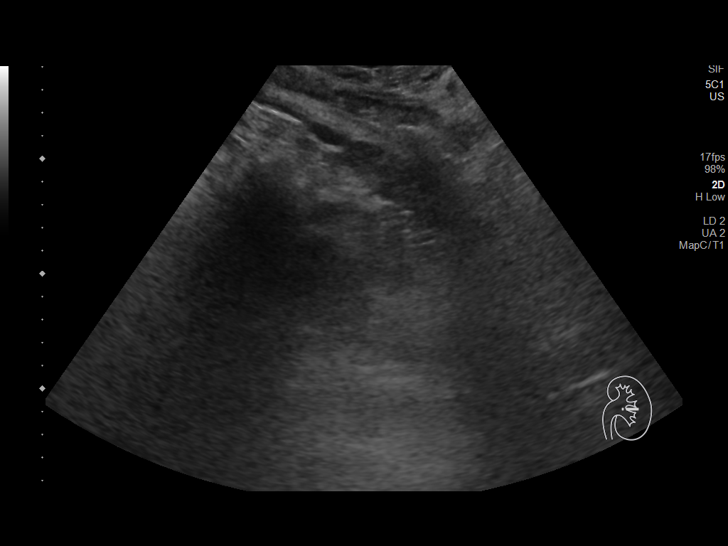
[im 18/21]
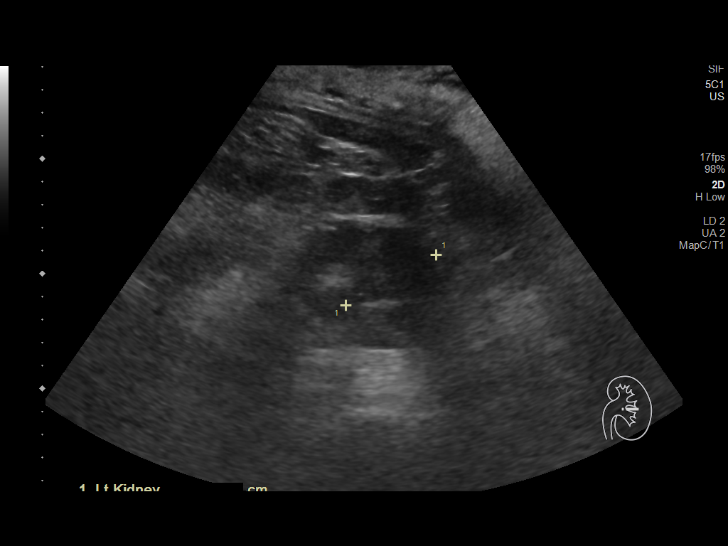
[im 19/21]
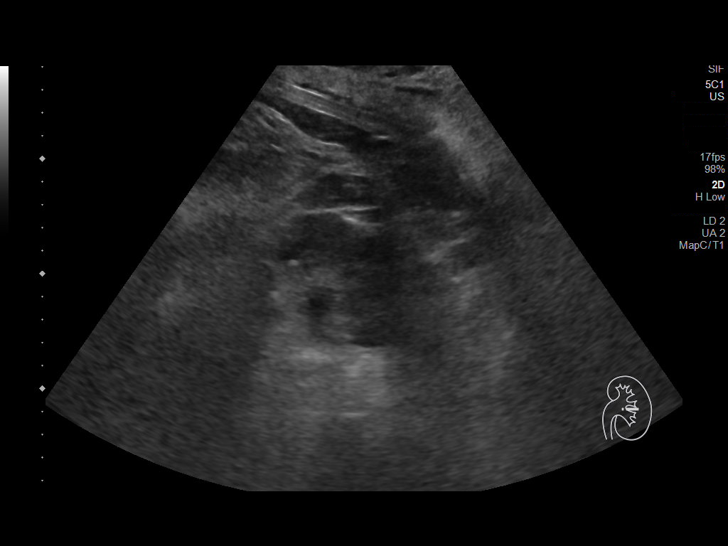
[im 21/21]
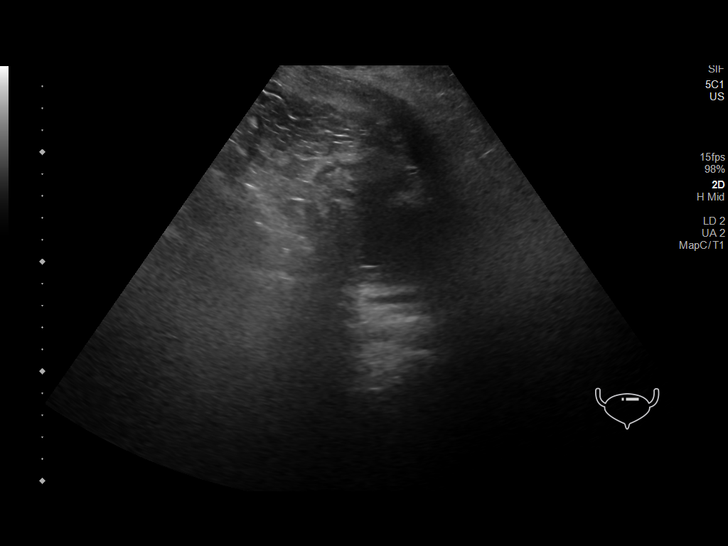

[14 of 21 positions shown; findings below may reference images not displayed]

FINDINGS: Right Kidney:

Surgically absent.

Left Kidney:

Renal measurements: 11.4 x 6.4 x 4.5 cm = volume: 172 mL. Previous
hydronephrosis has improved. Previous shadowing calculus is not
seen. Patient with reported ureteral stent, not visualized
sonographically.

Bladder:

Nondistended, questionable Foley catheter.

Other:

Technically limited exam.
IMPRESSION: 1. Technically limited evaluation. Previous left hydronephrosis is
no longer seen. Patient has reported left ureteral stent, however
this is not visualized sonographically. Additionally the previous
left renal calculus is not visualized, likely technical.
2. Prior right nephrectomy.
3. Decompressed urinary bladder.

## 2021-12-26 IMAGING — CR DG CHEST 2V
2 series · 2 of 2 positions shown · non-contrast
Comparison: 1 day prior

CLINICAL DATA: Shortness of breath and chest pain. COPD. Pleural
effusion.

EXAM:
CHEST - 2 VIEW

[chest ap]
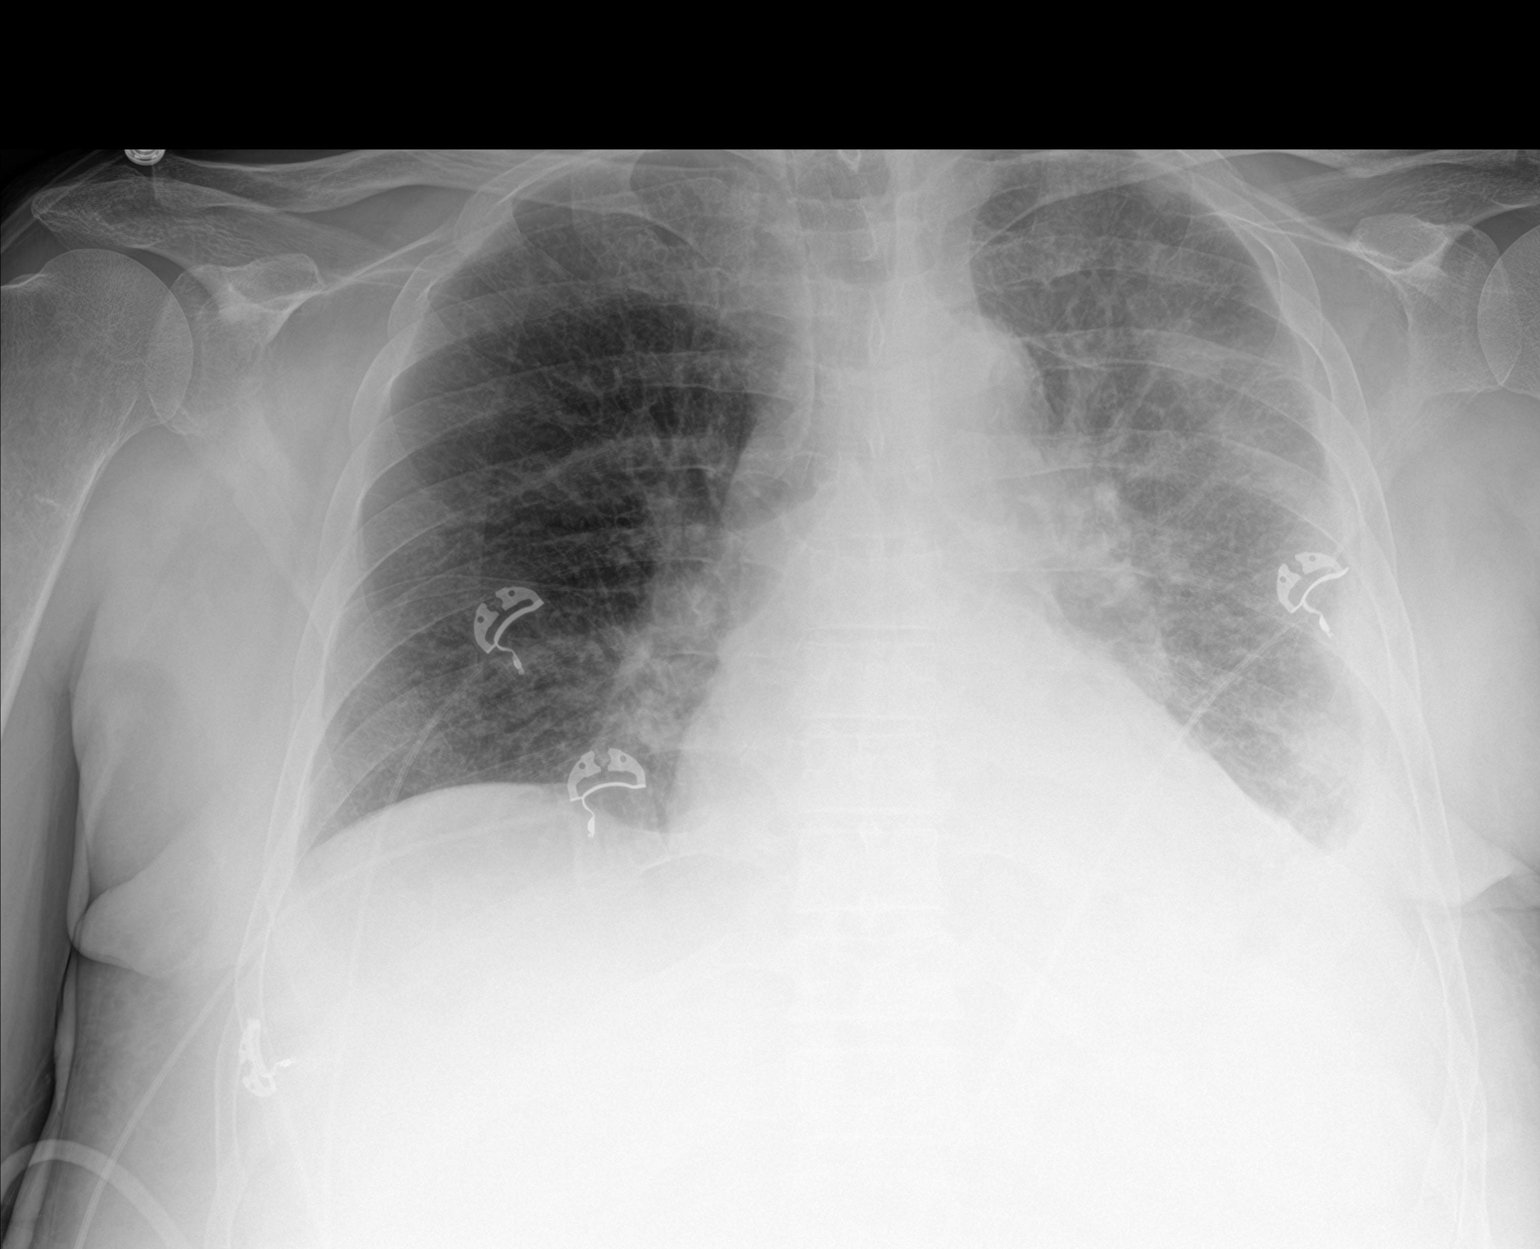

[chest lat]
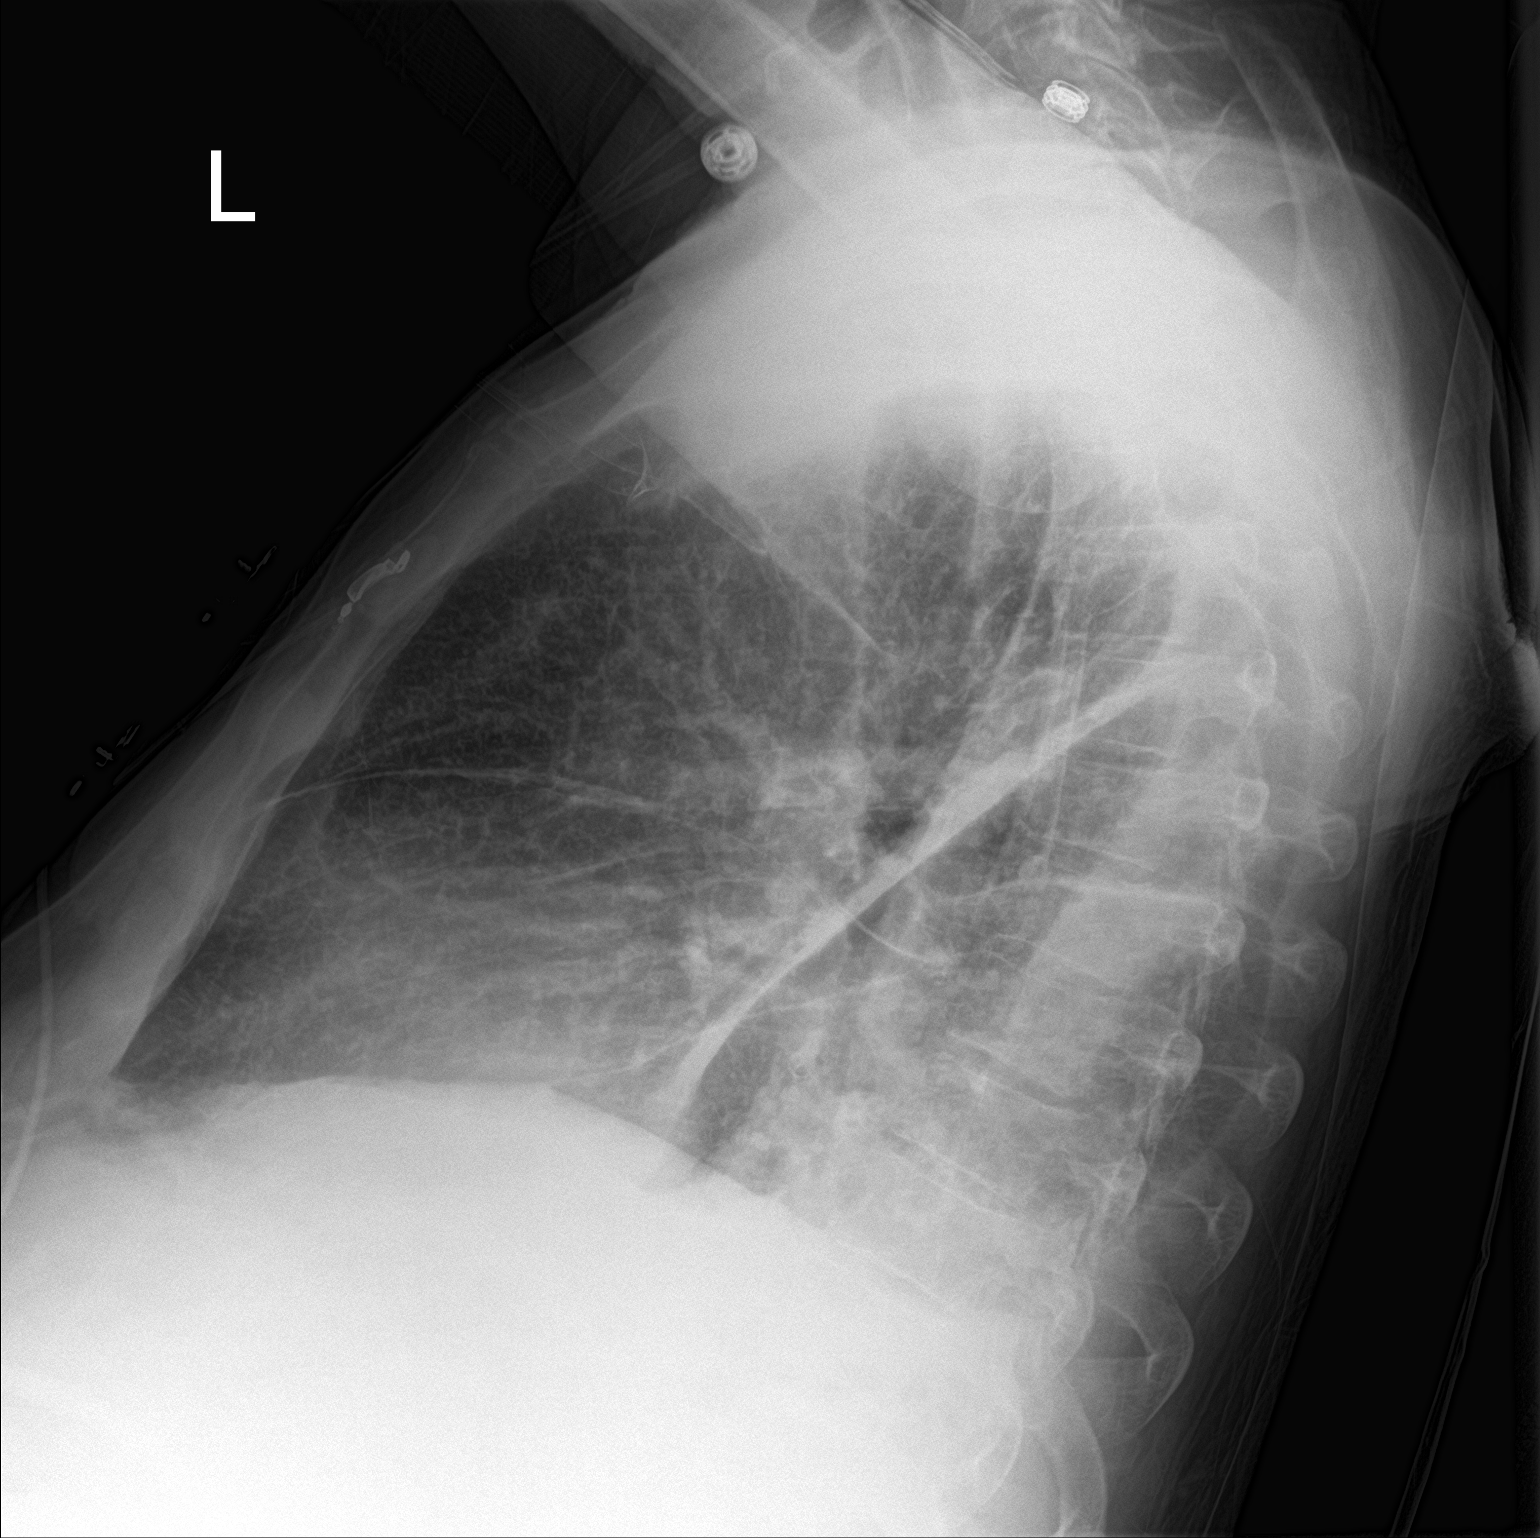

[2 of 2 positions shown; findings below may reference images not displayed]

FINDINGS: Midline trachea. Borderline cardiomegaly. Small left pleural
effusion, similar. Minimal loculation laterally. No pneumothorax.
Primarily left-sided interstitial opacities, minimally improved.
Airspace disease at the left lung base similar. Left upper lobe
pulmonary nodule again identified.
IMPRESSION: Minimal improvement in left-sided interstitial opacities, favoring
asymmetric pulmonary edema.

Small left pleural effusion and adjacent airspace disease, similar.
